# Patient Record
Sex: Male | Born: 1940 | Race: White | Hispanic: No | Marital: Married
Health system: Midwestern US, Academic
[De-identification: ages and names within clinical notes are randomized; demographics above are authoritative.]

---

## 2016-05-24 MED ORDER — IPRATROPIUM-ALBUTEROL 0.5 MG-3 MG(2.5 MG BASE)/3 ML IN NEBU
3 mL | INJECTION | RESPIRATORY_TRACT | 5 refills | 30.00000 days | Status: DC | PRN
Start: 2016-05-24 — End: 2016-11-10

## 2016-05-25 MED ORDER — PREDNISONE 10 MG PO TAB
20 mg | ORAL_TABLET | Freq: Every day | ORAL | 0 refills | Status: AC
Start: 2016-05-25 — End: ?

## 2016-05-25 MED ORDER — BENZONATATE 100 MG PO CAP
100 mg | ORAL_CAPSULE | Freq: Three times a day (TID) | ORAL | 0 refills | 9.00000 days | Status: DC | PRN
Start: 2016-05-25 — End: 2016-09-01

## 2016-05-26 MED ORDER — AZITHROMYCIN 250 MG PO TAB
ORAL_TABLET | Freq: Every day | 0 refills | Status: DC
Start: 2016-05-26 — End: 2016-06-28

## 2016-06-27 MED ORDER — MONTELUKAST 10 MG PO TAB
ORAL_TABLET | Freq: Every evening | 11 refills
Start: 2016-06-27 — End: ?

## 2016-06-28 MED ORDER — TIZANIDINE 4 MG PO TAB
4 mg | ORAL_TABLET | ORAL | 0 refills | Status: DC | PRN
Start: 2016-06-28 — End: 2016-06-28

## 2016-06-28 MED ORDER — ACETAMINOPHEN 500 MG PO TAB
1000 mg | ORAL_TABLET | Freq: Three times a day (TID) | ORAL | 1 refills | Status: DC
Start: 2016-06-28 — End: 2016-06-28

## 2016-06-28 MED ORDER — ACETAMINOPHEN 500 MG PO TAB
1000 mg | ORAL_TABLET | Freq: Three times a day (TID) | ORAL | 1 refills | Status: DC
Start: 2016-06-28 — End: 2016-11-16

## 2016-06-28 MED ORDER — TIZANIDINE 4 MG PO TAB
4 mg | ORAL_TABLET | ORAL | 0 refills | Status: DC | PRN
Start: 2016-06-28 — End: 2016-11-10

## 2016-06-28 MED ORDER — OXYCODONE 5 MG PO TAB
5 mg | ORAL_TABLET | Freq: Two times a day (BID) | ORAL | 0 refills | 6.00000 days | Status: DC | PRN
Start: 2016-06-28 — End: 2016-11-10

## 2016-06-28 MED ORDER — BUMETANIDE 2 MG PO TAB
2 mg | ORAL_TABLET | Freq: Two times a day (BID) | ORAL | 3 refills | Status: DC
Start: 2016-06-28 — End: 2016-06-28

## 2016-06-28 MED ORDER — BUMETANIDE 2 MG PO TAB
2 mg | ORAL_TABLET | Freq: Two times a day (BID) | ORAL | 3 refills | Status: DC
Start: 2016-06-28 — End: 2016-06-29

## 2016-06-29 MED ORDER — BUMETANIDE 2 MG PO TAB
2 mg | ORAL_TABLET | Freq: Every day | ORAL | 3 refills | Status: DC
Start: 2016-06-29 — End: 2016-08-08

## 2016-06-29 MED ORDER — GABAPENTIN 100 MG PO CAP
300 mg | ORAL_CAPSULE | Freq: Three times a day (TID) | ORAL | 3 refills | Status: DC
Start: 2016-06-29 — End: 2016-07-03

## 2016-07-03 MED ORDER — GABAPENTIN 100 MG PO CAP
400 mg | ORAL_CAPSULE | Freq: Three times a day (TID) | ORAL | 3 refills | Status: DC
Start: 2016-07-03 — End: 2016-08-17

## 2016-07-20 MED ORDER — PANTOPRAZOLE 40 MG PO TBEC
ORAL_TABLET | Freq: Two times a day (BID) | 11 refills
Start: 2016-07-20 — End: ?

## 2016-07-20 MED ORDER — RANITIDINE HCL 150 MG PO TAB
ORAL_TABLET | Freq: Every day | 2 refills
Start: 2016-07-20 — End: ?

## 2016-07-24 ENCOUNTER — Encounter: Admit: 2016-07-24 | Discharge: 2016-07-24 | Payer: MEDICARE

## 2016-07-27 ENCOUNTER — Ambulatory Visit: Admit: 2016-07-27 | Discharge: 2016-07-27 | Payer: MEDICARE

## 2016-07-27 ENCOUNTER — Encounter: Admit: 2016-07-27 | Discharge: 2016-07-27 | Payer: MEDICARE

## 2016-07-27 DIAGNOSIS — J479 Bronchiectasis, uncomplicated: ICD-10-CM

## 2016-07-27 DIAGNOSIS — I2510 Atherosclerotic heart disease of native coronary artery without angina pectoris: ICD-10-CM

## 2016-07-27 DIAGNOSIS — J45909 Unspecified asthma, uncomplicated: ICD-10-CM

## 2016-07-27 DIAGNOSIS — J449 Chronic obstructive pulmonary disease, unspecified: ICD-10-CM

## 2016-07-27 DIAGNOSIS — J302 Other seasonal allergic rhinitis: ICD-10-CM

## 2016-07-27 DIAGNOSIS — R0600 Dyspnea, unspecified: ICD-10-CM

## 2016-07-27 DIAGNOSIS — G4733 Obstructive sleep apnea (adult) (pediatric): ICD-10-CM

## 2016-07-27 DIAGNOSIS — M954 Acquired deformity of chest and rib: ICD-10-CM

## 2016-07-27 DIAGNOSIS — L03116 Cellulitis of left lower limb: ICD-10-CM

## 2016-07-27 DIAGNOSIS — I714 Abdominal aortic aneurysm, without rupture: ICD-10-CM

## 2016-07-27 DIAGNOSIS — Z9981 Dependence on supplemental oxygen: ICD-10-CM

## 2016-07-27 DIAGNOSIS — I509 Heart failure, unspecified: ICD-10-CM

## 2016-07-27 DIAGNOSIS — G4730 Sleep apnea, unspecified: ICD-10-CM

## 2016-07-27 DIAGNOSIS — R609 Edema, unspecified: ICD-10-CM

## 2016-07-27 DIAGNOSIS — I429 Cardiomyopathy, unspecified: ICD-10-CM

## 2016-07-27 DIAGNOSIS — I10 Essential (primary) hypertension: Secondary | ICD-10-CM

## 2016-07-27 DIAGNOSIS — E669 Obesity, unspecified: ICD-10-CM

## 2016-07-27 DIAGNOSIS — Z8614 Personal history of Methicillin resistant Staphylococcus aureus infection: ICD-10-CM

## 2016-07-27 DIAGNOSIS — E785 Hyperlipidemia, unspecified: ICD-10-CM

## 2016-07-27 DIAGNOSIS — Z8679 Personal history of other diseases of the circulatory system: ICD-10-CM

## 2016-07-27 DIAGNOSIS — E782 Mixed hyperlipidemia: ICD-10-CM

## 2016-07-27 DIAGNOSIS — K219 Gastro-esophageal reflux disease without esophagitis: ICD-10-CM

## 2016-07-27 DIAGNOSIS — R05 Cough: ICD-10-CM

## 2016-07-27 DIAGNOSIS — N183 Chronic kidney disease, stage 3 (moderate): ICD-10-CM

## 2016-07-27 DIAGNOSIS — J189 Pneumonia, unspecified organism: ICD-10-CM

## 2016-07-27 DIAGNOSIS — Z95828 Presence of other vascular implants and grafts: ICD-10-CM

## 2016-07-27 DIAGNOSIS — I5042 Chronic combined systolic (congestive) and diastolic (congestive) heart failure: ICD-10-CM

## 2016-07-27 NOTE — Progress Notes
Date of Service: 07/27/2016    Ruben Wood. is a 76 y.o. male.       HPI       Mr. Ruben Wood comes today for routine follow-up visit.  He is a remarkably pleasant gentleman whom I see for his abdominal aortic aneurysm.  He has moderately reduced LV systolic function and heart failure which is a combination of systolic and diastolic heart failure for which he follows with Dr. [name] and our heart failure nurse practitioners.      He reports that he has been struggling with shortness of breath for the last few months after he came down with a respiratory illness.  He has seen a heart failure colleagues in the interim and is scheduled to see Dr. [name] very soon.     He is also dealing with a left leg swelling which is somewhat painful last 1 month.  A duplex ultrasound was ordered by his physicians that was thought to be negative without DVT.  I reviewed the study today including the images and agree with the findings.  The patient had a similar problem last year but it spontaneously resolved.  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  He was placed on antibiotic by another physician though he does not remember the name.  He does not know any details about this.    He had a noncontrast CT done today which I have reviewed.  The aneurysm sac continues to see shrink and now measures 5.4 cm maximum dimension as compared to more than 5.8 last time.There is indeed good news for him    Vitals:    07/27/16 1006   BP: 142/60   Pulse: 95   SpO2: (!) 91%   Weight: (!) 137 kg (302 lb)   Height: 1.803 m (5' 11)     Body mass index is 42.12 kg/m???.     Past Medical History  Patient Active Problem List    Diagnosis Date Noted   ??? Lower extremity edema 07/13/2016   ??? 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 He has deficient pneumococcal serotypes. He has normal tetanus toxoid Ab and CH50. He received Pneumovax in clinic today.    - We recommend obtaining repeat pneumococcal serotypes in 4-6 weeks. We will obtain Diptheria Ab at that time and T&B cell panel.   - We will repeat IgG every 6 months.       ??? Moderate persistent asthma with acute exacerbation 02/25/2016     He has had asthma since sometime in his 20-30s. He gets cough, chest tightness, wheezing, shortness of breath that is year round with worsening in the Spring and Fall. Triggers: URI, hot/cold air. He has a history of colds dropping to the chest. He is currently on Symbicort 160/4.10mcg 2 puff BID but he has lost his spacer. He has had ~5 exacerbations in 2017 and 1 steroid course this year already. Additionally he has been diagnosed with bronchiectasis and emphysema; his PFTs suggest both obstructive and restrictive disease and asthma is unlikely to be the sole cause of his dyspnea.    He has negative IgE immunocaps to aeroallergens which makes the possibility of allergic asthma highly unlikely. His IgE level was 17 and he had negative ANCAs, MPO and PR3.     He has taken 2 days of steroids (prednisone 40mg ), however expired medication.  He reports this has improved his shortness of breath.     - We recommend completing 5 day course of prednisone and will prescribe 3 additional days of prednisone 40mg .   - We recommend adhering to Symbicort 160/4.50mcg 2 puff BID with Spacer. He is currently non-compliant with these medications.   - We recommend re-scheduling appointment with Dr. [name] for continued management.   - We will obtain records from Dr. Jasmine Awe office (Pulmonologist in [place], [place]) on whether the patient truly received Xolair.      ??? 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 He is not having issues with urticaria.    - We recommend IgE immunocaps to aeroallergens.      ??? Chronic Rhinoconjunctivitis 02/25/2016     He has had nasal congestion, rhinorrhea, PND, sneezing, itchy/watery eyes since he was a child that is year round and not worsening in seasons. Nasal sprays are not helpful (Flonase, Nasacort) and patient had incorrect technique with sprays with resultant nosebleeds. He takes Zyrtec 10mg  daily and Singulair 10mg  daily. He has had prior IT with Dr. [name] in [place], in [place], and with ENT physician Dr. Ander [name] in [place], [place]. He only found first round of IT helpful. He has never had sinus imaging or endoscopy. He had a normal CT sinuses 02/2016 and negative IgE immunocaps for aeroallergens 02/2016.     His symptoms are currently uncontrolled. He found many corticosteroids unhelpful including Flonase, Rhinocort, Nasacort. He is currently off of Zyrtec.     - We recommend starting Qnasl 2 sprays in each nostril once daily. We reviewed technique in clinic.   - We recommend re-starting zyrtec 10mg  daily and continuing singulair 10mg  daily. His most recent eGFR is 69. If this changes in the future, his anti-histamine dose may need to be adjusted.  - We recommend stopping Atrovent given the possibility of early glaucoma.      ??? 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 Etiologies include likely atopic disease, but other considerations include vasculitidies such as EGPA. Less likely parasitic infection as no bothersome GI symptoms or serpiginous rashes.    - We recommend repeating CBC w/ diff.   - We will obtain ANCAs     ??? Recurrent infections 02/25/2016     He has had 5-10 rounds of antibiotics per year for sinus infections and bronchitis. He was previously having recurrent pneumonias (3-4 times a year for 5 years) however not as of recently. Additionally he has had cellulitis in [place] requiring hospitalization and IV Abx. Although these infections could be explained by underlying disease processes (obstructive/restrictive lung diseases and venous stasis requiring vein stripping), he meets the warning signs from [facility] 10 signs of PIDD. He had mildly low IgG at 636 with normal IgA and IgM. He had positive tetanus toxoid Ab and normal CH50. He had deficient pneumococcal serotypes.     He received Pneumovax in clinic today. He received Prevnar in 2016.     - We recommend obtaining repeat pneumococcal serotypes in 4-6 weeks. We will obtain Diptheria Ab at that time and T&B cell panel.   - We will repeat IgG every 6 months.       ??? Postoperative visit 02/23/2016   ??? S/P left pulmonary artery pressure sensor implant placement (CardioMEMs)  02/02/2016     * Patient has CardioMEMs (Pulmonary Artery Pressure Sensor)* Please call Matthew Folks, CardioMEMs Program Coordinator [phone] or page Heart Failure  Rounding Team if patient is admitted or presents to ED*      ??? Ischemic cardiomyopathy 01/12/2016   ??? 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 Wound of skin 01/12/2016   ??? 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 Varicose veins of left lower extremity with pain 01/05/2016   ??? 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 Obesity, morbid (HCC) 11/25/2015   ??? Varicose veins of leg with pain, left 11/25/2015   ??? Pain of left lower extremity 11/25/2015   ??? Hypercholesterolemia 11/25/2015   ??? Obesity (BMI 35.0-39.9 without comorbidity) 11/25/2015   ??? 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 Bronchiectasis without complication (HCC) 06/18/2015     -flutter  -non sputum producer     ??? Chronic obstructive pulmonary disease (HCC) 03/10/2015   ??? Abnormal cortisol level 12/25/2014   ??? Venous stasis dermatitis of both lower extremities 11/04/2014   ??? Sacroiliac dysfunction 07/28/2014 ??? 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 Chronic combined systolic and diastolic congestive heart failure, NYHA class 2 (HCC) 07/19/2014     11/04/14: EF 20%, severely dilated LV with grade 1 diastolic dysfunction.  11/12/2014 In clinic today, patient appears fairly well compensated.  He does have some LEE, but no significant rales (just some at bases), no JVD sitting upright, and no sx to suggest decompensation.  We will continue metoprolol, spironolactone, and bumex at current doses.    11/17/2014 Edema much improved in lower ext, Cr trending down on last labs.  Will recheck labs today.  Advised to weigh himself daily.  Rechecking BMP today to ensure not overdoing diuretics.     On metoprolol, spironolactone.  Will need to explore allergy to ARB more as patient may benefit from ACEI.      03/29/14 CardioMEMS implant by Dr. Chales Abrahams  1. Normal cardiac output and cardiac index.  2. Normal pulmonary pressures and normal pulmonary capillary wedge pressure.  3. Successful insertion of CardioMEMS pulmonary artery pressure sensor     ??? Cardiomyopathy (HCC) 07/19/2014       CardioMEMS Primary Screening                                  Date: 02/23/2015        Heart failure admission within last 12 months                  Date: 11/03/14 Yes        NYHA HF Class III Yes        Able to take dual antiplatelet or anticoagulants for 1 month post implant Yes   Additional Screening if all above are yes        Patient with active infection No        History of recurrent PE or DVT No        Unable to tolerate RHC No        GFR < 25 mL/min and non-responsive to diuretics or on chronic renal dialysis No  Congenital heart disease or mechanical right heart valve No        CRT device implanted within last 3 months?          Date of Implant:  No        BMI > 35, axillary chest circumference > 165 cm                       140 cm  No        Unwilling to transmit device date or concerns with patient compliance No        ??? 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      ??? CKD (chronic kidney disease) stage 3, GFR 30-59 ml/min 08/26/2013   ??? Mixed dyslipidemia 08/26/2013     Hepatic Function    Lab Results   Component Value Date/Time    ALBUMIN 4.0 11/04/2014 12:26 AM    TOTAL PROTEIN 6.9 11/04/2014 12:26 AM    ALK PHOSPHATASE 61 11/04/2014 12:26 AM    Lab Results   Component Value Date/Time    AST (SGOT) 41* 11/04/2014 12:26 AM    ALT (SGPT) 19 11/04/2014 12:26 AM    TOTAL BILIRUBIN 1.4* 11/04/2014 12:26 AM        Lab Results   Component Value Date    CHOL 148 12/15/2013    TRIG 122 12/15/2013    HDL 51 12/15/2013    LDL 88 12/15/2013    VLDL 24 12/15/2013    NONHDLCHOL 97 12/15/2013       BP Readings from Last 3 Encounters:   11/12/14 129/80   11/09/14 111/44   11/04/14 146/65     Plan:   76 y.o. with known CAD with CTO of RCA and obtuse marginal branch with high grade stenosis (90%) in mid left circ.    Advised heart healthy diet and daily exercise.    Continue high intensity statin, atorvastatin       ??? AAA (abdominal aortic aneurysm) (HCC) 10/15/2012     Duplex done at OSH in 2007 and 2008 ~ 4.1 cm    Duplex 10/15/12-AAA 7.3 cm AP x 7.3 cm       ??? History of MRSA infection 10/14/2012   ??? Chronic cough 08/29/2012   ??? CAD (coronary artery disease) 08/15/2012     07/11/2002- Cath @ [facility] showed Severe double vessel disease(OMB of circumflex and distal circ)  inferior-basilar dyskinesis.                  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    09/12/12   Cath - [facility]:  CTO of the right coronary artery, CTO of the obtuse marginal branch and high-grade stenosis of   90% in the mid left circumflex artery No significant stenosis in the left anterior descending artery or left main vessel     Failed attempt in opening 2nd obtuse marginal CTO as described  above.    10/14/12: Unsuccessful attempt to open CTO of OMB and mid-CFX by Dr. Mackey Birchwood         ??? OSA on CPAP 08/13/2012     CPAP at  DME: [facility]     ??? GERD (gastroesophageal reflux disease) 08/13/2012     -PPI  -follows with GI     ??? 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  Discussed the importance of diet.  Exercise as tolerated.        ??? Essential hypertension 08/13/2012   ??? Chest wall deformity 07/09/2012     Chest wall reconstruction on 07/09/12  Lung herniation- bio bridge           Review of Systems   Constitution: Positive for weight gain.   HENT: Positive for hearing loss, hoarse voice and stridor.    Eyes: Positive for photophobia.   Cardiovascular: Positive for claudication, dyspnea on exertion and leg swelling.   Respiratory: Positive for cough, shortness of breath, sputum production and wheezing.    Endocrine: Negative.    Hematologic/Lymphatic: Bruises/bleeds easily.   Skin: Positive for dry skin and itching.   Musculoskeletal: Positive for back pain, gout and muscle cramps.   Gastrointestinal: Positive for bloating.   Genitourinary: Negative.    Neurological: Positive for loss of balance and paresthesias.   Psychiatric/Behavioral: Negative. Allergic/Immunologic: Positive for environmental allergies.   All other systems reviewed and are negative.      Physical Exam      General Appearance: no acute distress, obese  Skin: warm & intact ???  HEENT: EOMI, mucous membranes moist, oropharynx is clear ???  Neck Veins: neck veins are flat & not distended ???  Carotid Arteries: no bruits ???  Chest Inspection: chest incisions are well healed ???  Auscultation/Percussion: lungs clear to auscultation, no rales, rhonchi, or wheezing ???  Cardiac Rhythm: regular rhythm & normal rate ???  Cardiac Auscultation: distant heart tones, but otherwise normal S1 & S2, no S3 or S4, no rub ???  Murmurs: no cardiac murmurs ???  Extremities: 1-2+ right leg edema. 3-4+ left leg edema. Left shin is erythematous and left leg is moderately tender  Abdominal Exam: obese, non-tender, no masses, bowel sounds+, nonpalpable abdominal aorta ???  Liver & Spleen: difficult to assess due to obese body habitus ???  Neurologic Exam: oriented to time, place and person; no focal neurologic deficit    Problems Addressed Today  Encounter Diagnoses   Name Primary?   ??? Abdominal aortic aneurysm (AAA) without rupture (HCC) Yes       Assessment and Plan     This in summary from my standpoint in regards to his infrarenal abdominal aortic aneurysm that isstatus post endovascular repair he has a shrinking aortic aneurysm sac and overall is doing well from that regard.  I will see him back in 1 year with a repeat noncontrast CT of the abdomen and pelvis.    I am worried about his left leg swelling which is at least 3+ edema going from the foot all the way up to the thighs.  The shin is red and moderately tender.  I have asked him to stop by Dr. Lowella Bandy office to see if he  can be seen today for ruling out cellulitis.  I reviewed his echocardiogram also and he has an eccentric at least moderate mitral regurgitation which may actually be worse than it looks. I will also sent a communication to Dr. Sanjuana Letters office to see what they thought about it.           Current Medications (including today's revisions)  ??? acetaminophen (TYLENOL) 500 mg tablet Take 2 tablets by mouth three times daily. Max of 4,000 mg of acetaminophen in 24 hours.   ??? albuterol (PROAIR HFA, VENTOLIN HFA, OR PROVENTIL HFA) 90 mcg/actuation inhaler Inhale 2 puffs by mouth into the lungs every 4 hours as needed for Wheezing or Shortness of Breath.   ??? albuterol-ipratropium (DUONEB) 0.5 mg-3 mg(2.5 mg base)/3 mL nebulizer solution Inhale 3 mL solution by nebulizer as directed every 6 hours as needed for Wheezing.   ??? allopurinol (ZYLOPRIM) 300 mg tablet Take 1 tablet by mouth daily.   ??? AMITRIPTYLINE HCL (AMITRIPTYLINE/GABAPENTIN/EMU OIL(#)) 05/24/08 % Apply 5 g topically to affected area three times daily.   ??? aspirin EC 81 mg tablet Take 81 mg by mouth daily.   ??? atorvastatin (LIPITOR) 20 mg tablet TAKE ONE TABLET BY MOUTH ONCE DAILY   ??? benzonatate (TESSALON PERLES) 100 mg capsule Take 1 capsule by mouth three times daily as needed for Cough.   ??? budesonide/formoterol (SYMBICORT HFA) 160/4.5 mcg inhalation Inhale 2 puffs by mouth into the lungs twice daily. Rinse mouth after use.   ??? bumetanide (BUMEX) 2 mg tablet Take 1 tablet by mouth daily.   ??? cetirizine (ZYRTEC) 10 mg tablet Take 10 mg by mouth every morning.   ??? cholecalciferol (VITAMIN D-3) 1,000 units tablet Take 1,000 Units by mouth daily.   ??? citalopram (CELEXA) 20 mg tablet Take 1 tablet by mouth daily.   ??? codeine-guaiFENesin (ROBITUSSIN-AC) 10-100 mg/5 mL oral solution Take 5 mL by mouth every 4 hours as needed for Cough.   ??? Flunisolide 25 mcg (0.025 %) spry Apply 2 sprays to each nostril as directed twice daily.   ??? gabapentin (NEURONTIN) 100 mg capsule Take 4 capsules by mouth three times daily.   ??? GUAIFENESIN (MUCINEX PO) Take 1 tablet by mouth as Needed.   ??? ipratropium bromide (ATROVENT) 42 mcg (0.06 %) nasal spray 1 spray to each nostril Q6H PRN   ??? L.ACID/L.CASEI/B.BIF/B.LON/FOS (PROBIOTIC BLEND PO) Take 1 Cap by mouth.   ??? lutein-zeaxanthin 25-5 mg cap Take  by mouth.   ??? MEDICAL SUPPLY, MISCELLANEOUS (COMPRESSION STOCKINGS MISC) Use  as directed. 20-30 mm hg pressure   ??? metoprolol XL (TOPROL XL) 25 mg extended release tablet Take 1 tablet by mouth daily.   ??? montelukast (SINGULAIR) 10 mg tablet TAKE ONE TABLET BY MOUTH AT BEDTIME. MAINTENANCE THERAPY FOR ASTHMA.   ??? mupirocin (BACTROBAN) 2 % topical ointment    ??? nitroglycerin (NITROSTAT) 0.4 mg tablet Place 1 Tab under tongue every 5 minutes as needed for Chest Pain.   ??? oxyCODONE (ROXICODONE, OXY-IR) 5 mg tablet Take 1 tablet by mouth every 12 hours as needed for Pain (Patient taking differently: Take 5 mg by mouth three times daily )   ??? pantoprazole DR (PROTONIX) 40 mg tablet Take 1 Tab by mouth twice daily.   ??? ranitidine(+) (ZANTAC) 150 mg tablet TAKE ONE TABLET BY MOUTH ONCE DAILY   ??? senna/docusate (SENOKOT-S) 8.6/50 mg tablet Take 1 Tab by mouth twice daily.   ??? sildenafil(+) (VIAGRA) 50 mg tablet Take 1 tablet  by mouth as Needed for Erectile dysfunction.   ??? spironolactone (ALDACTONE) 25 mg tablet Take 1 tablet by mouth twice daily.   ??? tiZANidine (ZANAFLEX) 4 mg tablet Take 1 tablet by mouth every 6 hours as needed.

## 2016-07-28 ENCOUNTER — Encounter: Admit: 2016-07-28 | Discharge: 2016-07-28 | Payer: MEDICARE

## 2016-07-28 NOTE — Telephone Encounter
Received VM from pt's wife, Ruben Wood, reporting that they both had appointments on May 24, they talked with Dr. Samuella Cota and told her they may not be able to make the appointment due to Ruben Wood having an appointment with the cardiology team right before that.  She reports she told Dr. Samuella Cota she would change the dates, but she told her not to, if they could get there by 11:00 she would still see them.  She reports the appointment did run into their appointment times, they called and everything was fine, but now they are getting letters that if they have enough no shows in a 12 month period they will be dismissed from the clinic and I call them threatening letters.  She states, I don't appreciate that and neither does Ruben and I want the doctor to know that we are very unhappy with getting these letters when all of this should not have ever been in the system.      Per Dr. Hendricks Milo telephone note on 07/10/16, Him and his wife Ruben Wood have an appointment with me this Thursday on 5/24.  Mrs. Ruben Wood was telling me that due to recent problems with Ruben Wood is heart failure, they are planning to see a cardiologist on 5/24 at 9:45 AM.  They are worried about not being able to make the appointment with me.  I told them both to come to the clinic, if possible, by 11 AM that day.  We will make our best effort to see Mr. Ruben Wood and Ruben Wood.    Returned the call at [phone].  Spoke with Ruben Wood.  I apologized for the letters and let him know I can see that they spoke with Dr. Samuella Cota regarding possibly missing the appointments on that day.  I let him know that Dr. Samuella Cota may not be aware that this letter was sent out and I will let her know that they were unhappy.  I let him know I will see if the the no-show could be removed from their charts.  He reports they would like to re-schedule their appointments; offered to provide the phone number for scheduling and he reports they have the number.  He had no further questions or concerns.

## 2016-08-01 ENCOUNTER — Encounter: Admit: 2016-08-01 | Discharge: 2016-08-01 | Payer: MEDICARE

## 2016-08-01 NOTE — Telephone Encounter
E-mail sent to IM Scheduling and [facility] to request No Show be removed from the patient chart.

## 2016-08-02 NOTE — Telephone Encounter
Per e-mail from [facility]:  I have updated this appt from a no-show to a cancel. The letter has been voided and he will not be penalized for this visit. Thank you for letting us know!

## 2016-08-08 ENCOUNTER — Encounter: Admit: 2016-08-08 | Discharge: 2016-08-08 | Payer: MEDICARE

## 2016-08-08 ENCOUNTER — Ambulatory Visit: Admit: 2016-08-08 | Discharge: 2016-08-08 | Payer: MEDICARE

## 2016-08-08 DIAGNOSIS — I5042 Chronic combined systolic (congestive) and diastolic (congestive) heart failure: Principal | ICD-10-CM

## 2016-08-08 DIAGNOSIS — G4733 Obstructive sleep apnea (adult) (pediatric): ICD-10-CM

## 2016-08-08 DIAGNOSIS — I420 Dilated cardiomyopathy: ICD-10-CM

## 2016-08-08 DIAGNOSIS — I10 Essential (primary) hypertension: ICD-10-CM

## 2016-08-08 DIAGNOSIS — Z789 Other specified health status: ICD-10-CM

## 2016-08-08 DIAGNOSIS — Z959 Presence of cardiac and vascular implant and graft, unspecified: ICD-10-CM

## 2016-08-08 LAB — BNP (B-TYPE NATRIURETIC PEPTI): Lab: 81 pg/mL (ref 0–100)

## 2016-08-08 LAB — BASIC METABOLIC PANEL
Lab: 1.3 mg/dL — ABNORMAL HIGH (ref 0.4–1.24)
Lab: 101 mg/dL — ABNORMAL HIGH (ref 70–100)
Lab: 104 MMOL/L (ref 98–110)
Lab: 137 MMOL/L (ref 137–147)
Lab: 33 mg/dL — ABNORMAL HIGH (ref 7–25)
Lab: 4.8 MMOL/L (ref 3.5–5.1)
Lab: 6 (ref 3–12)

## 2016-08-08 MED ORDER — POTASSIUM CHLORIDE 20 MEQ PO TBTQ
20 meq | ORAL_TABLET | Freq: Two times a day (BID) | ORAL | 3 refills | 30.00000 days | Status: AC
Start: 2016-08-08 — End: 2016-08-17

## 2016-08-08 MED ORDER — BUMETANIDE 2 MG PO TAB
2 mg | ORAL_TABLET | Freq: Every day | ORAL | 3 refills | Status: AC
Start: 2016-08-08 — End: 2016-08-17

## 2016-08-08 NOTE — Progress Notes
Daily CardioMEMS Readings    Goal PA Diastolic: 7-16 mmHg    Taken on PA Systolic PA Diastolic PA Mean Heart Rate Notes   08-08-2016, 06:22 AM 28 mmHg 11 mmHg 17 mmHg 93 bpm    08-07-2016, 06:31 AM 33 mmHg 15 mmHg 21 mmHg 81 bpm    08-06-2016, 07:45 AM 30 mmHg 11 mmHg 17 mmHg 89 bpm    08-05-2016, 04:15 PM 28 mmHg 10 mmHg 16 mmHg 94 bpm    08-04-2016, 12:36 PM 27 mmHg 10 mmHg 16 mmHg 96 bpm    08-04-2016, 09:03 AM 30 mmHg 10 mmHg 17 mmHg 79 bpm    08-04-2016, 08:30 AM 32 mmHg 13 mmHg 19 mmHg 84 bpm    08-03-2016, 09:37 AM 26 mmHg 8 mmHg 15 mmHg 96 bpm    08-01-2016, 08:01 AM 32 mmHg 13 mmHg 19 mmHg 101 bpm    07-30-2016, 08:09 AM 29 mmHg 12 mmHg 18 mmHg 94 bpm    07-28-2016, 08:30 AM 28 mmHg 9 mmHg 15 mmHg 91 bpm    07-27-2016, 04:51 AM 30 mmHg 12 mmHg 18 mmHg 99 bpm    07-26-2016, 06:32 PM 31 mmHg 11 mmHg 18 mmHg 99 bpm    07-25-2016, 10:17 AM 28 mmHg 11 mmHg 17 mmHg 100 bpm    07-24-2016, 05:14 PM 28 mmHg 11 mmHg 16 mmHg 96 bpm    07-21-2016, 09:28 AM 30 mmHg 12 mmHg 18 mmHg 88 bpm    07-20-2016, 08:12 AM 30 mmHg 12 mmHg 18 mmHg 87 bpm    07-19-2016, 09:16 AM 30 mmHg 11 mmHg 18 mmHg 93 bpm    07-18-2016, 10:54 AM 30 mmHg 13 mmHg 19 mmHg 102 bpm    07-16-2016, 08:10 AM 33 mmHg 13 mmHg 20 mmHg 96 bpm    07-14-2016, 02:44 PM 29 mmHg 10 mmHg 16 mmHg 94 bpm    07-13-2016, 05:31 AM 32 mmHg 13 mmHg 19 mmHg 96 bpm PAD 13--(Goal 9-16) OV with JP today. No medication changes. LS   07-12-2016, 11:21 AM 29 mmHg 9 mmHg 16 mmHg 82 bpm    07-11-2016, 09:33 AM 30 mmHg 11 mmHg 17 mmHg 97 bpm    07-10-2016, 10:07 AM 31 mmHg 12 mmHg 18 mmHg 101 bpm    07-09-2016, 07:20 PM 39 mmHg 18 mmHg 25 mmHg 87 bpm    07-07-2016, 07:05 AM 33 mmHg 13 mmHg 20 mmHg 80 bpm    07-06-2016, 06:18 AM 30 mmHg 10 mmHg 17 mmHg 79 bpm    07-05-2016, 09:44 AM 31 mmHg 12 mmHg 18 mmHg 85 bpm    07-04-2016, 10:33 AM 33 mmHg 12 mmHg 19 mmHg 70 bpm    07-03-2016, 08:16 AM 28 mmHg 13 mmHg 18 mmHg 87 bpm    07-01-2016, 08:40 AM 28 mmHg 9 mmHg 15 mmHg 67 bpm 06-29-2016 -- -- -- -- No readings yet today. After review of patient's BMP and BNP results, Herby Abraham and Dr. Crecencio Mc feel patient's weight gain is calorie weight gain since patient is no longer following his weight loss diet plan. Order from Herby Abraham to DECREASE BUMEX to 2mg  DAILY & repeat BMP on 5/17. Continue  to encourage TED hose for venous insufficiency. LS   06-28-2016, 12:13 PM 30 mmHg 11 mmHg 18 mmHg 96 bpm OV with Herby Abraham today. Patient's bumex was increased to 2mg  AM and 1mg  PM by pt's PCP 1 week ago. Dana increased Bumex to 2mg  BID today. His weight is up 23lbs since last clinic visit, & pt  has peripheral edema--TED hose recommended. Next F/U apt 5/24 with Bunnie Philips. LS   06-26-2016, 04:05 PM 28 mmHg 10 mmHg 16 mmHg 93 bpm Wife reports weight 288.2lbs today, up 8 lbs from one week ago.   New Swelling in LEs this week--was not present last week.   Abdominal swelling worse this week than last week.   Increased SOA with activity.Pt denies SOA at rest. Patient has been using oxygen intermittently during the day over the last few weeks (recent respiratory infection). Patient recently finished a course of levaquin and prednisone on 5/3.   CardioMEMS readings within goal.  Labs requested from [facility] (see lab flowsheet)  Wife is concerned and feels patient needs to be evaluated by HF provider this week--Scheduled patient with Herby Abraham on 5/9 at 15:00. Confirmed date/time with patient's wife.  LS   06-25-2016, 07:50 AM 31 mmHg 12 mmHg 18 mmHg 84 bpm    06-23-2016, 06:40 AM 29 mmHg 10 mmHg 17 mmHg 75 bpm    06-22-2016, 06:13 AM 27 mmHg 9 mmHg 15 mmHg 82 bpm    06-21-2016, 11:46 AM 25 mmHg 8 mmHg 14 mmHg 86 bpm    06-20-2016, 08:44 PM 25 mmHg 7 mmHg 13 mmHg 81 bpm    06-19-2016, 11:08 AM 24 mmHg 8 mmHg 14 mmHg 91 bpm    06-18-2016, 03:19 PM 31 mmHg 11 mmHg 17 mmHg 76 bpm    06-17-2016, 10:21 AM 25 mmHg 7 mmHg 13 mmHg 87 bpm    06-16-2016, 10:22 AM 27 mmHg 9 mmHg 15 mmHg 75 bpm Matthew Folks, RN, Engineer, civil (consulting)  Phone: [phone]  Pager: [phone]

## 2016-08-08 NOTE — Telephone Encounter
Based on lab results from today, Dr. Jacqulyn Ducking would like Ruben Wood to increase his Bumex to 4mg  BID with BID of Kcl.  He needs a BMP check with his HF visit.  Refilled Bumex with normal instructions, and patient will increase per Dr. Sanjuana Letters instructions the next week.

## 2016-08-08 NOTE — Progress Notes
Date of Service: 08/08/2016    Ruben Jennette. is a 76 y.o. male.       HPI     Ruben Wood is a 76 yo male who presents to our clinic for follow up of his chronic systolic and diastolic heart failure. His most recent EF was 40%. Previously he had an EF of 20%. He also has hisotry of CAD, AAA that he follows with Dr. Harley Alto for, HTN, COPD, OSA, and chronic venous stasis. He underwent placement of his Cardiomems device in February of 2017 for more intensive management of his heart failure. He also underwent placement of a [employer]. Jude CRT-D.     Today Ruben Wood is presenting with complaints of dyspnea with light activity, even at rest. He also describes significant orthopnea, requiring him to sleep in recliner at night.  He has had intermittent cough, but no fevers.  He also describes some significant fluid retention particularly in his lower extremities.  Been seen multiple times for this, and underwent ultrasound for DVT, but none was found.  There is previously been concern for cellulitis, and he states he was placed on antibiotic but is not sure which one it was.  He states that he is aware of his fluid restriction, and some restriction in attempts to followed as best he can.  He takes Bumex daily, 2 mg in the morning.  He does not take any potassium replacement with this.  He denies any chest pain, palpitations, device discharges, fevers or chills.    He was previously seen in the weight management clinic, and had significant weight loss at that time.  However since he developed respiratory illness this past winter, he has been steadily gaining weight.  His back and leg pain that is being addressed in the spine center is also limiting his ability to be active.  His weight is increased nearly 40 pounds since her last visit in March of this year.  This is unlikely to be due purely to caloric intake, particularly given his symptomatology.         Vitals:    08/08/16 0916   BP: 122/62   Pulse: 86   SpO2: 96% Weight: (!) 138.8 kg (306 lb)   Height: 1.803 m (5' 11)     Body mass index is 42.68 kg/m???.     Past Medical History  Patient Active Problem List    Diagnosis Date Noted   ??? Lower extremity edema 07/13/2016   ??? 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 He has deficient pneumococcal serotypes. He has normal tetanus toxoid Ab and CH50. He received Pneumovax in clinic today.    - We recommend obtaining repeat pneumococcal serotypes in 4-6 weeks. We will obtain Diptheria Ab at that time and T&B cell panel.   - We will repeat IgG every 6 months.       ??? Moderate persistent asthma with acute exacerbation 02/25/2016     He has had asthma since sometime in his 20-30s. He gets cough, chest tightness, wheezing, shortness of breath that is year round with worsening in the Spring and Fall. Triggers: URI, hot/cold air. He has a history of colds dropping to the chest. He is currently on Symbicort 160/4.41mcg 2 puff BID but he has lost his spacer. He has had ~5 exacerbations in 2017 and 1 steroid course this year already. Additionally he has been diagnosed with bronchiectasis and emphysema; his PFTs suggest  both obstructive and restrictive disease and asthma is unlikely to be the sole cause of his dyspnea.    He has negative IgE immunocaps to aeroallergens which makes the possibility of allergic asthma highly unlikely. His IgE level was 17 and he had negative ANCAs, MPO and PR3.     He has taken 2 days of steroids (prednisone 40mg ), however expired medication. He reports this has improved his shortness of breath.     - We recommend completing 5 day course of prednisone and will prescribe 3 additional days of prednisone 40mg .   - We recommend adhering to Symbicort 160/4.50mcg 2 puff BID with Spacer. He is currently non-compliant with these medications.   - We recommend re-scheduling appointment with Dr. [name] for continued management. - We will obtain records from Dr. Jasmine Awe office (Pulmonologist in [place], [place]) on whether the patient truly received Xolair.      ??? 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 He is not having issues with urticaria.    - We recommend IgE immunocaps to aeroallergens.      ??? Chronic Rhinoconjunctivitis 02/25/2016     He has had nasal congestion, rhinorrhea, PND, sneezing, itchy/watery eyes since he was a child that is year round and not worsening in seasons. Nasal sprays are not helpful (Flonase, Nasacort) and patient had incorrect technique with sprays with resultant nosebleeds. He takes Zyrtec 10mg  daily and Singulair 10mg  daily. He has had prior IT with Dr. [name] in [place], in [place], and with ENT physician Dr. Ander [name] in [place], [place]. He only found first round of IT helpful. He has never had sinus imaging or endoscopy. He had a normal CT sinuses 02/2016 and negative IgE immunocaps for aeroallergens 02/2016.     His symptoms are currently uncontrolled. He found many corticosteroids unhelpful including Flonase, Rhinocort, Nasacort. He is currently off of Zyrtec.     - We recommend starting Qnasl 2 sprays in each nostril once daily. We reviewed technique in clinic.   - We recommend re-starting zyrtec 10mg  daily and continuing singulair 10mg  daily. His most recent eGFR is 69. If this changes in the future, his anti-histamine dose may need to be adjusted.  - We recommend stopping Atrovent given the possibility of early glaucoma.      ??? 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 Etiologies include likely atopic disease, but other considerations include vasculitidies such as EGPA. Less likely parasitic infection as no bothersome GI symptoms or serpiginous rashes.    - We recommend repeating CBC w/ diff.   - We will obtain ANCAs     ??? Recurrent infections 02/25/2016     He has had 5-10 rounds of antibiotics per year for sinus infections and bronchitis. He was previously having recurrent pneumonias (3-4 times a year for 5 years) however not as of recently. Additionally he has had cellulitis in LE requiring hospitalization and IV Abx. Although these infections could be explained by underlying disease processes (obstructive/restrictive lung diseases and venous stasis requiring vein stripping), he meets the warning signs from [facility] 10 signs of PIDD.    He had mildly low IgG at 636 with normal IgA and IgM. He had positive tetanus toxoid Ab and normal CH50. He had deficient pneumococcal serotypes.     He received Pneumovax in clinic today. He received Prevnar in 2016.     - We recommend obtaining repeat pneumococcal serotypes in 4-6 weeks. We will obtain Diptheria  Ab at that time and T&B cell panel.   - We will repeat IgG every 6 months.       ??? Postoperative visit 02/23/2016   ??? S/P left pulmonary artery pressure sensor implant placement (CardioMEMs)  02/02/2016     * Patient has CardioMEMs (Pulmonary Artery Pressure Sensor)* Please call Matthew Folks, CardioMEMs Program Coordinator [phone] or page Heart Failure Rounding Team if patient is admitted or presents to ED*      ??? Ischemic cardiomyopathy 01/12/2016   ??? 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 Wound of skin 01/12/2016   ??? 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 Varicose veins of left lower extremity with pain 01/05/2016   ??? 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 Obesity, morbid (HCC) 11/25/2015   ??? Varicose veins of leg with pain, left 11/25/2015   ??? Pain of left lower extremity 11/25/2015   ??? Hypercholesterolemia 11/25/2015   ??? Obesity (BMI 35.0-39.9 without comorbidity) 11/25/2015   ??? 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 Bronchiectasis without complication (HCC) 06/18/2015     -flutter  -non sputum producer     ??? Chronic obstructive pulmonary disease (HCC) 03/10/2015   ??? Abnormal cortisol level 12/25/2014   ??? Venous stasis dermatitis of both lower extremities 11/04/2014   ??? Sacroiliac dysfunction 07/28/2014   ??? 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 Chronic combined systolic and diastolic congestive heart failure, NYHA class 2 (HCC) 07/19/2014     11/04/14: EF 20%, severely dilated LV with grade 1 diastolic dysfunction.  11/12/2014 In clinic today, patient appears fairly well compensated.  He does have some LEE, but no significant rales (just some at bases), no JVD sitting upright, and no sx to suggest decompensation.  We will continue metoprolol, spironolactone, and bumex at current doses.    11/17/2014 Edema much improved in lower ext, Cr trending down on last labs.  Will recheck labs today.  Advised to weigh himself daily.  Rechecking BMP today to ensure not overdoing diuretics.     On metoprolol, spironolactone.  Will need to explore allergy to ARB more as patient may benefit from ACEI.      03/29/14 CardioMEMS implant by Dr. Chales Abrahams  1. Normal cardiac output and cardiac index.  2. Normal pulmonary pressures and normal pulmonary capillary wedge pressure.  3. Successful insertion of CardioMEMS pulmonary artery pressure sensor     ??? Cardiomyopathy (HCC) 07/19/2014       CardioMEMS Primary Screening                                  Date: 02/23/2015        Heart failure admission within last 12 months                  Date: 11/03/14 Yes        NYHA HF Class III Yes        Able to take dual antiplatelet or anticoagulants for 1 month post implant Yes   Additional Screening if  all above are yes Patient with active infection No        History of recurrent PE or DVT No        Unable to tolerate RHC No        GFR < 25 mL/min and non-responsive to diuretics or on chronic renal dialysis No        Congenital heart disease or mechanical right heart valve No        CRT device implanted within last 3 months?          Date of Implant:  No        BMI > 35, axillary chest circumference > 165 cm                       140 cm  No        Unwilling to transmit device date or concerns with patient compliance No        ??? 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      ??? CKD (chronic kidney disease) stage 3, GFR 30-59 ml/min 08/26/2013   ??? Mixed dyslipidemia 08/26/2013     Hepatic Function    Lab Results   Component Value Date/Time    ALBUMIN 4.0 11/04/2014 12:26 AM    TOTAL PROTEIN 6.9 11/04/2014 12:26 AM    ALK PHOSPHATASE 61 11/04/2014 12:26 AM    Lab Results   Component Value Date/Time    AST (SGOT) 41* 11/04/2014 12:26 AM    ALT (SGPT) 19 11/04/2014 12:26 AM    TOTAL BILIRUBIN 1.4* 11/04/2014 12:26 AM        Lab Results   Component Value Date    CHOL 148 12/15/2013    TRIG 122 12/15/2013    HDL 51 12/15/2013    LDL 88 12/15/2013    VLDL 24 12/15/2013    NONHDLCHOL 97 12/15/2013       BP Readings from Last 3 Encounters:   11/12/14 129/80   11/09/14 111/44   11/04/14 146/65     Plan:   76 y.o. with known CAD with CTO of RCA and obtuse marginal branch with high grade stenosis (90%) in mid left circ.    Advised heart healthy diet and daily exercise.    Continue high intensity statin, atorvastatin ??? AAA (abdominal aortic aneurysm) (HCC) 10/15/2012     Duplex done at OSH in 2007 and 2008 ~ 4.1 cm    Duplex 10/15/12-AAA 7.3 cm AP x 7.3 cm       ??? History of MRSA infection 10/14/2012   ??? Chronic cough 08/29/2012   ??? CAD (coronary artery disease) 08/15/2012     07/11/2002- Cath @ [facility] showed Severe double vessel disease(OMB of circumflex and distal circ)  inferior-basilar dyskinesis.                  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    09/12/12  Cath - [facility]:  CTO of the right coronary artery, CTO of the obtuse marginal branch and high-grade stenosis of   90% in the mid left circumflex artery No significant stenosis in the left anterior descending artery or left main vessel     Failed attempt in opening 2nd obtuse marginal CTO as described above.    10/14/12: Unsuccessful attempt to open CTO of OMB and mid-CFX by Dr. Mackey Birchwood         ??? OSA on CPAP 08/13/2012     CPAP at  DME: [facility]     ??? GERD (gastroesophageal reflux disease) 08/13/2012     -PPI  -follows with GI     ??? 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  Discussed the importance of diet.  Exercise as tolerated.        ??? Essential hypertension 08/13/2012   ??? Chest wall deformity 07/09/2012     Chest wall reconstruction on 07/09/12  Lung herniation- bio bridge           Review of Systems   Constitution: Positive for weight gain.   HENT: Positive for hearing loss, hoarse voice and stridor.    Eyes: Positive for photophobia.   Cardiovascular: Positive for dyspnea on exertion and leg swelling. Respiratory: Positive for cough, shortness of breath and wheezing.    Endocrine: Positive for polyuria.   Hematologic/Lymphatic: Negative.    Skin: Positive for dry skin.   Musculoskeletal: Positive for back pain and gout.   Gastrointestinal: Positive for flatus and nausea.   Genitourinary: Positive for incomplete emptying.   Neurological: Negative.    Psychiatric/Behavioral: Negative.    Allergic/Immunologic: Positive for environmental allergies.       Physical Exam   Constitutional: He appears well-developed and well-nourished. No distress.   HENT:   Head: Normocephalic and atraumatic.   Eyes: Conjunctivae and EOM are normal.   Neck: Normal range of motion.   Evaluation of JVD limited secondary to body habitus.   Cardiovascular:   Soft heart sounds, normal rate and regular rhythm.  No murmur auscultated.   Pulmonary/Chest:   Mildly increased respiratory effort, soft crackles auscultated in lower bases lung fields, though auscultation significantly limited secondary to body habitus.  Soft lung sounds overall.   Abdominal: Soft. Bowel sounds are normal. He exhibits no distension. There is no tenderness.   Musculoskeletal:   2+ pitting edema on the right-hand side to the knee.  3+ pitting edema to mid thigh on the left side with overlying erythema.  No open wounds.  Tender to palpation.   Neurological: He is alert and oriented to person, place, and time.   Skin: He is not diaphoretic.     Assessment and Plan     Ruben Wood presents for follow-up of his chronic systolic and diastolic heart failure.  Today he appears quite volume up, as evidenced by his significant symptomatology, and edematous physical exam.  Evaluation of his Cardiomems device shows overall elevation of his diastolic PA pressures consistent with volume overload.  We will obtain a BMP and BNP, and increase his diuretic dose of Bumex to 4 mg twice a day.  He will need to be seen in the heart failure clinic within  the next week for follow-up of his acute exacerbation of chronic and systolic and diastolic heart failure.  We will consider addition of potassium supplementation with this increased regimen based on his BMP today.    Cardiology Staff Attestation  I have personally seen, examined, and evaluated this patient.  I concur with the above medical documentation including key parts of the history & physical examination as outlined by the resident, Dr. Maxwell Caul, and jointly formulated the treatment plan.    Coral Else. Jacqulyn Ducking, MD, [facility], FACP           Current Medications (including today's revisions)  ??? acetaminophen (TYLENOL) 500 mg tablet Take 2 tablets by mouth three times daily. Max of 4,000 mg of acetaminophen in 24 hours.   ??? albuterol (PROAIR HFA, VENTOLIN HFA, OR PROVENTIL HFA) 90 mcg/actuation inhaler Inhale 2 puffs by mouth into the lungs every 4 hours as needed for Wheezing or Shortness of Breath.   ??? albuterol-ipratropium (DUONEB) 0.5 mg-3 mg(2.5 mg base)/3 mL nebulizer solution Inhale 3 mL solution by nebulizer as directed every 6 hours as needed for Wheezing.   ??? allopurinol (ZYLOPRIM) 300 mg tablet Take 1 tablet by mouth daily.   ??? AMITRIPTYLINE HCL (AMITRIPTYLINE/GABAPENTIN/EMU OIL(#)) 05/24/08 % Apply 5 g topically to affected area three times daily.   ??? aspirin EC 81 mg tablet Take 81 mg by mouth daily.   ??? atorvastatin (LIPITOR) 20 mg tablet TAKE ONE TABLET BY MOUTH ONCE DAILY   ??? benzonatate (TESSALON PERLES) 100 mg capsule Take 1 capsule by mouth three times daily as needed for Cough.   ??? budesonide/formoterol (SYMBICORT HFA) 160/4.5 mcg inhalation Inhale 2 puffs by mouth into the lungs twice daily. Rinse mouth after use.   ??? bumetanide (BUMEX) 2 mg tablet Take 1 tablet by mouth daily.   ??? cetirizine (ZYRTEC) 10 mg tablet Take 10 mg by mouth every morning.   ??? cholecalciferol (VITAMIN D-3) 1,000 units tablet Take 1,000 Units by mouth daily.   ??? citalopram (CELEXA) 20 mg tablet Take 1 tablet by mouth daily. ??? codeine-guaiFENesin (ROBITUSSIN-AC) 10-100 mg/5 mL oral solution Take 5 mL by mouth every 4 hours as needed for Cough.   ??? Flunisolide 25 mcg (0.025 %) spry Apply 2 sprays to each nostril as directed twice daily.   ??? gabapentin (NEURONTIN) 100 mg capsule Take 4 capsules by mouth three times daily.   ??? GUAIFENESIN (MUCINEX PO) Take 1 tablet by mouth as Needed.   ??? ipratropium bromide (ATROVENT) 42 mcg (0.06 %) nasal spray 1 spray to each nostril Q6H PRN   ??? L.ACID/L.CASEI/B.BIF/B.LON/FOS (PROBIOTIC BLEND PO) Take 1 Cap by mouth.   ??? lutein-zeaxanthin 25-5 mg cap Take  by mouth.   ??? MEDICAL SUPPLY, MISCELLANEOUS (COMPRESSION STOCKINGS MISC) Use  as directed. 20-30 mm hg pressure   ??? metoprolol XL (TOPROL XL) 25 mg extended release tablet Take 1 tablet by mouth daily.   ??? montelukast (SINGULAIR) 10 mg tablet TAKE ONE TABLET BY MOUTH AT BEDTIME. MAINTENANCE THERAPY FOR ASTHMA.   ??? mupirocin (BACTROBAN) 2 % topical ointment    ??? nitroglycerin (NITROSTAT) 0.4 mg tablet Place 1 Tab under tongue every 5 minutes as needed for Chest Pain.   ??? oxyCODONE (ROXICODONE, OXY-IR) 5 mg tablet Take 1 tablet by mouth every 12 hours as needed for Pain (Patient taking differently: Take 5 mg by mouth three times daily )   ??? pantoprazole DR (PROTONIX) 40 mg tablet Take 1 Tab by mouth twice daily.   ???  ranitidine(+) (ZANTAC) 150 mg tablet TAKE ONE TABLET BY MOUTH ONCE DAILY   ??? senna/docusate (SENOKOT-S) 8.6/50 mg tablet Take 1 Tab by mouth twice daily.   ??? sildenafil(+) (VIAGRA) 50 mg tablet Take 1 tablet by mouth as Needed for Erectile dysfunction.   ??? spironolactone (ALDACTONE) 25 mg tablet Take 1 tablet by mouth twice daily.   ??? tiZANidine (ZANAFLEX) 4 mg tablet Take 1 tablet by mouth every 6 hours as needed.

## 2016-08-09 ENCOUNTER — Ambulatory Visit: Admit: 2016-08-09 | Discharge: 2016-08-10 | Payer: MEDICARE

## 2016-08-09 ENCOUNTER — Encounter: Admit: 2016-08-09 | Discharge: 2016-08-09 | Payer: MEDICARE

## 2016-08-09 DIAGNOSIS — L03116 Cellulitis of left lower limb: ICD-10-CM

## 2016-08-09 DIAGNOSIS — J189 Pneumonia, unspecified organism: ICD-10-CM

## 2016-08-09 DIAGNOSIS — E785 Hyperlipidemia, unspecified: ICD-10-CM

## 2016-08-09 DIAGNOSIS — I429 Cardiomyopathy, unspecified: ICD-10-CM

## 2016-08-09 DIAGNOSIS — J479 Bronchiectasis, uncomplicated: ICD-10-CM

## 2016-08-09 DIAGNOSIS — E669 Obesity, unspecified: ICD-10-CM

## 2016-08-09 DIAGNOSIS — Z95828 Presence of other vascular implants and grafts: ICD-10-CM

## 2016-08-09 DIAGNOSIS — I5042 Chronic combined systolic (congestive) and diastolic (congestive) heart failure: ICD-10-CM

## 2016-08-09 DIAGNOSIS — I714 Abdominal aortic aneurysm, without rupture: ICD-10-CM

## 2016-08-09 DIAGNOSIS — G4730 Sleep apnea, unspecified: ICD-10-CM

## 2016-08-09 DIAGNOSIS — I2510 Atherosclerotic heart disease of native coronary artery without angina pectoris: ICD-10-CM

## 2016-08-09 DIAGNOSIS — G4733 Obstructive sleep apnea (adult) (pediatric): ICD-10-CM

## 2016-08-09 DIAGNOSIS — Z8614 Personal history of Methicillin resistant Staphylococcus aureus infection: ICD-10-CM

## 2016-08-09 DIAGNOSIS — J449 Chronic obstructive pulmonary disease, unspecified: ICD-10-CM

## 2016-08-09 DIAGNOSIS — Z9981 Dependence on supplemental oxygen: ICD-10-CM

## 2016-08-09 DIAGNOSIS — R0600 Dyspnea, unspecified: ICD-10-CM

## 2016-08-09 DIAGNOSIS — E782 Mixed hyperlipidemia: ICD-10-CM

## 2016-08-09 DIAGNOSIS — N183 Chronic kidney disease, stage 3 (moderate): ICD-10-CM

## 2016-08-09 DIAGNOSIS — I509 Heart failure, unspecified: ICD-10-CM

## 2016-08-09 DIAGNOSIS — I10 Essential (primary) hypertension: Secondary | ICD-10-CM

## 2016-08-09 DIAGNOSIS — J45909 Unspecified asthma, uncomplicated: ICD-10-CM

## 2016-08-09 DIAGNOSIS — J302 Other seasonal allergic rhinitis: ICD-10-CM

## 2016-08-09 DIAGNOSIS — R05 Cough: ICD-10-CM

## 2016-08-09 DIAGNOSIS — K219 Gastro-esophageal reflux disease without esophagitis: ICD-10-CM

## 2016-08-09 DIAGNOSIS — Z8679 Personal history of other diseases of the circulatory system: ICD-10-CM

## 2016-08-09 DIAGNOSIS — M954 Acquired deformity of chest and rib: ICD-10-CM

## 2016-08-09 DIAGNOSIS — R609 Edema, unspecified: ICD-10-CM

## 2016-08-09 MED ORDER — LIDOCAINE 5 % TP PTMD
1 | MEDICATED_PATCH | TOPICAL | 1 refills | Status: SS
Start: 2016-08-09 — End: 2016-11-10

## 2016-08-10 DIAGNOSIS — M545 Low back pain: ICD-10-CM

## 2016-08-10 DIAGNOSIS — M4807 Spinal stenosis, lumbosacral region: Principal | ICD-10-CM

## 2016-08-14 ENCOUNTER — Encounter: Admit: 2016-08-14 | Discharge: 2016-08-14 | Payer: MEDICARE

## 2016-08-14 NOTE — Telephone Encounter
PA needed for Lido Patch. Will submit to [facility].

## 2016-08-16 ENCOUNTER — Encounter: Admit: 2016-08-16 | Discharge: 2016-08-16 | Payer: MEDICARE

## 2016-08-16 DIAGNOSIS — I5042 Chronic combined systolic (congestive) and diastolic (congestive) heart failure: Principal | ICD-10-CM

## 2016-08-17 ENCOUNTER — Encounter: Admit: 2016-08-17 | Discharge: 2016-08-17 | Payer: MEDICARE

## 2016-08-17 ENCOUNTER — Ambulatory Visit: Admit: 2016-08-17 | Discharge: 2016-08-17 | Payer: MEDICARE

## 2016-08-17 DIAGNOSIS — J479 Bronchiectasis, uncomplicated: ICD-10-CM

## 2016-08-17 DIAGNOSIS — I255 Ischemic cardiomyopathy: ICD-10-CM

## 2016-08-17 DIAGNOSIS — G4730 Sleep apnea, unspecified: ICD-10-CM

## 2016-08-17 DIAGNOSIS — K219 Gastro-esophageal reflux disease without esophagitis: ICD-10-CM

## 2016-08-17 DIAGNOSIS — I714 Abdominal aortic aneurysm, without rupture: ICD-10-CM

## 2016-08-17 DIAGNOSIS — I420 Dilated cardiomyopathy: ICD-10-CM

## 2016-08-17 DIAGNOSIS — M954 Acquired deformity of chest and rib: ICD-10-CM

## 2016-08-17 DIAGNOSIS — I509 Heart failure, unspecified: ICD-10-CM

## 2016-08-17 DIAGNOSIS — Z8679 Personal history of other diseases of the circulatory system: ICD-10-CM

## 2016-08-17 DIAGNOSIS — I2510 Atherosclerotic heart disease of native coronary artery without angina pectoris: ICD-10-CM

## 2016-08-17 DIAGNOSIS — E785 Hyperlipidemia, unspecified: ICD-10-CM

## 2016-08-17 DIAGNOSIS — N183 Chronic kidney disease, stage 3 (moderate): ICD-10-CM

## 2016-08-17 DIAGNOSIS — I5042 Chronic combined systolic (congestive) and diastolic (congestive) heart failure: ICD-10-CM

## 2016-08-17 DIAGNOSIS — R609 Edema, unspecified: ICD-10-CM

## 2016-08-17 DIAGNOSIS — E669 Obesity, unspecified: ICD-10-CM

## 2016-08-17 DIAGNOSIS — Z95828 Presence of other vascular implants and grafts: ICD-10-CM

## 2016-08-17 DIAGNOSIS — R0600 Dyspnea, unspecified: ICD-10-CM

## 2016-08-17 DIAGNOSIS — Z8614 Personal history of Methicillin resistant Staphylococcus aureus infection: ICD-10-CM

## 2016-08-17 DIAGNOSIS — E782 Mixed hyperlipidemia: ICD-10-CM

## 2016-08-17 DIAGNOSIS — I429 Cardiomyopathy, unspecified: ICD-10-CM

## 2016-08-17 DIAGNOSIS — Z9981 Dependence on supplemental oxygen: ICD-10-CM

## 2016-08-17 DIAGNOSIS — J189 Pneumonia, unspecified organism: ICD-10-CM

## 2016-08-17 DIAGNOSIS — J449 Chronic obstructive pulmonary disease, unspecified: ICD-10-CM

## 2016-08-17 DIAGNOSIS — I10 Essential (primary) hypertension: Principal | ICD-10-CM

## 2016-08-17 DIAGNOSIS — L03116 Cellulitis of left lower limb: ICD-10-CM

## 2016-08-17 DIAGNOSIS — G4733 Obstructive sleep apnea (adult) (pediatric): ICD-10-CM

## 2016-08-17 DIAGNOSIS — R05 Cough: ICD-10-CM

## 2016-08-17 DIAGNOSIS — J45909 Unspecified asthma, uncomplicated: ICD-10-CM

## 2016-08-17 DIAGNOSIS — J302 Other seasonal allergic rhinitis: ICD-10-CM

## 2016-08-17 LAB — BASIC METABOLIC PANEL
Lab: 10 FL (ref 3–12)
Lab: 134 MMOL/L — ABNORMAL LOW (ref 137–147)
Lab: 137 mg/dL — ABNORMAL HIGH (ref 70–100)
Lab: 2 mg/dL — ABNORMAL HIGH (ref 0.4–1.24)
Lab: 26 MMOL/L (ref 21–30)
Lab: 32 mL/min — ABNORMAL LOW (ref >60)
Lab: 38 mL/min — ABNORMAL LOW (ref >60)
Lab: 5 MMOL/L (ref 3.5–5.1)
Lab: 64 mg/dL — ABNORMAL HIGH (ref 7–25)
Lab: 9.6 mg/dL — ABNORMAL HIGH (ref 8.5–10.6)

## 2016-08-17 LAB — BNP (B-TYPE NATRIURETIC PEPTI): Lab: 81 pg/mL — ABNORMAL HIGH (ref 0–100)

## 2016-08-17 MED ORDER — POTASSIUM CHLORIDE 20 MEQ PO TBTQ
20 meq | ORAL_TABLET | Freq: Every day | ORAL | 3 refills | 30.00000 days | Status: AC
Start: 2016-08-17 — End: 2016-10-10

## 2016-08-17 MED ORDER — BUMETANIDE 2 MG PO TAB
4 mg | ORAL_TABLET | Freq: Two times a day (BID) | ORAL | 2 refills | Status: AC
Start: 2016-08-17 — End: 2016-08-18

## 2016-08-17 NOTE — Progress Notes
Date of Service: 08/17/2016    Ruben Antigua. is a 76 y.o. male.       HPI     Ruben Wood presents to the heart clinic for follow-up.    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 He had also been following with Dr. Vita Barley in the weight reduction clinic.  He also underwent placement of a [employer] CRT-D.     He was seen by Dr. Ferd Glassing on 08/08/2016.  He reported significant orthopnea, lower extremity edema, and weight gain.  Dr. Ferd Glassing noted that Ruben Wood has gained nearly 40 pounds since his last visit in March of this year.  He was instructed to increase Bumex to 4 mg twice daily (from 2 mg in the morning) with BID of KCl.     Today, Ruben Wood reports his body swelling is much better and his exertional dyspnea is a little bit better.  He continuous to sleep in a recliner.  He denies orthopnea, PND, chest pain, palpitation, dizziness, and syncope.??? Clinic weight today has improved to 292.6 lbs (from 306 lbs on 08/08/16).  He home weight is down to 292.6 lbs today.  He is trying to eat a low-salt diet and watching his fluid intake.     His BMP today showed that creatinine has increased to 2.05 and BUN is 64 (from 1.38 & 33 respectively on 08/08/16).  Sodium is 134, K+ 5.0, Cl 98, and CO 26.       His CardioMEMS reading on the website show that his PA diastolic pressure has been ranging from 11-12 mmHg over the past week.         Vitals:    08/17/16 1433   BP: 116/60   Pulse: 89   SpO2: 94%   Weight: 132.7 kg (292 lb 9.6 oz)   Height: 1.803 m (5' 11)     Body mass index is 40.81 kg/m???.     Past Medical History Ruben Wood Active Problem List    Diagnosis Date Noted   ??? Lower extremity edema 07/13/2016   ??? 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 He has deficient pneumococcal serotypes. He has normal tetanus toxoid Ab and CH50. He received Pneumovax in clinic today.    - We recommend obtaining repeat pneumococcal serotypes in 4-6 weeks. We will obtain Diptheria Ab at that time and T&B cell panel.   - We will repeat IgG every 6 months.       ??? Moderate persistent asthma with acute exacerbation 02/25/2016     He has had asthma since sometime in his 20-30s. He gets cough, chest tightness, wheezing, shortness of breath that is year round with worsening in the Spring and Fall. Triggers: URI, hot/cold air. He has a history of colds dropping to the chest. He is currently on Symbicort 160/4.39mcg 2 puff BID but he has lost his spacer. He has had ~5 exacerbations in 2017 and 1 steroid course this year already. Additionally he has been diagnosed with bronchiectasis and emphysema; his PFTs suggest both obstructive and restrictive disease and asthma is unlikely to be the sole cause of his dyspnea.  He has negative IgE immunocaps to aeroallergens which makes the possibility of allergic asthma highly unlikely. His IgE level was 17 and he had negative ANCAs, MPO and PR3.     He has taken 2 days of steroids (prednisone 40mg ), however expired medication. He reports this has improved his shortness of breath.     - We recommend completing 5 day course of prednisone and will prescribe 3 additional days of prednisone 40mg .   - We recommend adhering to Symbicort 160/4.49mcg 2 puff BID with Spacer. He is currently non-compliant with these medications.   - We recommend re-scheduling appointment with Dr. [name] for continued management.   - We will obtain records from Dr. Jasmine Awe office (Pulmonologist in [place], [place]) on whether the Ruben Wood truly received Xolair.      ??? Dermatographic urticaria 02/25/2016 Positive saline reaction on SPT today. He is not having issues with urticaria.    - We recommend IgE immunocaps to aeroallergens.      ??? Chronic Rhinoconjunctivitis 02/25/2016     He has had nasal congestion, rhinorrhea, PND, sneezing, itchy/watery eyes since he was a child that is year round and not worsening in seasons. Nasal sprays are not helpful (Flonase, Nasacort) and Ruben Wood had incorrect technique with sprays with resultant nosebleeds. He takes Zyrtec 10mg  daily and Singulair 10mg  daily. He has had prior IT with Dr. [name] in [place], in [place], and with ENT physician Dr. Ander [name] in [place], [place]. He only found first round of IT helpful. He has never had sinus imaging or endoscopy. He had a normal CT sinuses 02/2016 and negative IgE immunocaps for aeroallergens 02/2016.     His symptoms are currently uncontrolled. He found many corticosteroids unhelpful including Flonase, Rhinocort, Nasacort. He is currently off of Zyrtec.     - We recommend starting Qnasl 2 sprays in each nostril once daily. We reviewed technique in clinic.   - We recommend re-starting zyrtec 10mg  daily and continuing singulair 10mg  daily. His most recent eGFR is 69. If this changes in the future, his anti-histamine dose may need to be adjusted.  - We recommend stopping Atrovent given the possibility of early glaucoma.      ??? 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 Etiologies include likely atopic disease, but other considerations include vasculitidies such as EGPA. Less likely parasitic infection as no bothersome GI symptoms or serpiginous rashes.    - We recommend repeating CBC w/ diff.   - We will obtain ANCAs     ??? Recurrent infections 02/25/2016     He has had 5-10 rounds of antibiotics per year for sinus infections and bronchitis. He was previously having recurrent pneumonias (3-4 times a year for 5 years) however not as of recently. Additionally he has had cellulitis in LE requiring hospitalization and IV Abx. Although these infections could be explained by underlying disease processes (obstructive/restrictive lung diseases and venous stasis requiring vein stripping), he meets the warning signs from [facility] 10 signs of PIDD.    He had mildly low IgG at 636 with normal IgA and IgM. He had positive tetanus toxoid Ab and normal CH50. He had deficient pneumococcal serotypes.     He received Pneumovax in clinic today. He received Prevnar in 2016.     - We recommend obtaining repeat pneumococcal serotypes in 4-6 weeks. We will obtain Diptheria Ab at that time and T&B cell panel.   - We will repeat IgG every 6 months.       ???  Postoperative visit 02/23/2016   ??? S/P left pulmonary artery pressure sensor implant placement (CardioMEMs)  02/02/2016     * Ruben Wood has CardioMEMs (Pulmonary Artery Pressure Sensor)* Please call Matthew Folks, CardioMEMs Program Coordinator [phone] or page Heart Failure Rounding Team if Ruben Wood is admitted or presents to ED*      ??? Ischemic cardiomyopathy 01/12/2016   ??? 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 Wound of skin 01/12/2016   ??? 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 Varicose veins of left lower extremity with pain 01/05/2016   ??? 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 Obesity, morbid (HCC) 11/25/2015   ??? Varicose veins of leg with pain, left 11/25/2015   ??? Pain of left lower extremity 11/25/2015   ??? Hypercholesterolemia 11/25/2015   ??? Obesity (BMI 35.0-39.9 without comorbidity) 11/25/2015   ??? Mixed restrictive and obstructive lung disease (HCC) 06/18/2015     Mixed obstructive and restrictive on PFT's  Obstructions-  Smoker quit- 80 pyh  Allergic asthma- with chronic sinusitis and allergic rhinitis    Restrictive-  Kyphosis, obesity, chest wall reconstruction    Inhalers  Symbicort- prn  Albuterol  Singulair  duoneb ??? Bronchiectasis without complication (HCC) 06/18/2015     -flutter  -non sputum producer     ??? Chronic obstructive pulmonary disease (HCC) 03/10/2015   ??? Abnormal cortisol level 12/25/2014   ??? Venous stasis dermatitis of both lower extremities 11/04/2014   ??? Sacroiliac dysfunction 07/28/2014   ??? 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 Chronic combined systolic and diastolic congestive heart failure, NYHA class 2 (HCC) 07/19/2014     11/04/14: EF 20%, severely dilated LV with grade 1 diastolic dysfunction.  11/12/2014 In clinic today, Ruben Wood appears fairly well compensated.  He does have some LEE, but no significant rales (just some at bases), no JVD sitting upright, and no sx to suggest decompensation.  We will continue metoprolol, spironolactone, and bumex at current doses.    11/17/2014 Edema much improved in lower ext, Cr trending down on last labs.  Will recheck labs today.  Advised to weigh himself daily.  Rechecking BMP today to ensure not overdoing diuretics.     On metoprolol, spironolactone.  Will need to explore allergy to ARB more as Ruben Wood may benefit from ACEI.      03/29/14 CardioMEMS implant by Dr. Chales Abrahams  1. Normal cardiac output and cardiac index.  2. Normal pulmonary pressures and normal pulmonary capillary wedge pressure.  3. Successful insertion of CardioMEMS pulmonary artery pressure sensor     ??? Cardiomyopathy (HCC) 07/19/2014       CardioMEMS Primary Screening                                  Date: 02/23/2015        Heart failure admission within last 12 months                  Date: 11/03/14 Yes        NYHA HF Class III Yes        Able to take dual antiplatelet or anticoagulants for 1 month post implant Yes   Additional Screening if all above are yes        Ruben Wood with active infection No        History of recurrent PE or  DVT No        Unable to tolerate RHC No        GFR < 25 mL/min and non-responsive to diuretics or on chronic renal dialysis No Congenital heart disease or mechanical right heart valve No        CRT device implanted within last 3 months?          Date of Implant:  No        BMI > 35, axillary chest circumference > 165 cm                       140 cm  No        Unwilling to transmit device date or concerns with Ruben Wood compliance No        ??? 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      ??? CKD (chronic kidney disease) stage 3, GFR 30-59 ml/min (HCC) 08/26/2013   ??? Mixed dyslipidemia 08/26/2013     Hepatic Function    Lab Results   Component Value Date/Time    ALBUMIN 4.0 11/04/2014 12:26 AM    TOTAL PROTEIN 6.9 11/04/2014 12:26 AM    ALK PHOSPHATASE 61 11/04/2014 12:26 AM    Lab Results   Component Value Date/Time    AST (SGOT) 41* 11/04/2014 12:26 AM    ALT (SGPT) 19 11/04/2014 12:26 AM    TOTAL BILIRUBIN 1.4* 11/04/2014 12:26 AM        Lab Results   Component Value Date    CHOL 148 12/15/2013    TRIG 122 12/15/2013    HDL 51 12/15/2013    LDL 88 12/15/2013    VLDL 24 12/15/2013    NONHDLCHOL 97 12/15/2013       BP Readings from Last 3 Encounters:   11/12/14 129/80   11/09/14 111/44   11/04/14 146/65     Plan:   76 y.o. with known CAD with CTO of RCA and obtuse marginal branch with high grade stenosis (90%) in mid left circ.    Advised heart healthy diet and daily exercise.    Continue high intensity statin, atorvastatin       ??? AAA (abdominal aortic aneurysm) (HCC) 10/15/2012     Duplex done at OSH in 2007 and 2008 ~ 4.1 cm    Duplex 10/15/12-AAA 7.3 cm AP x 7.3 cm       ??? History of MRSA infection 10/14/2012 ??? Chronic cough 08/29/2012   ??? CAD (coronary artery disease) 08/15/2012     07/11/2002- Cath @ [facility] showed Severe double vessel disease(OMB of circumflex and distal circ)  inferior-basilar dyskinesis.                  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    09/12/12   Cath - [facility]:  CTO of the right coronary artery, CTO of the obtuse marginal branch and high-grade stenosis of  90% in the mid left circumflex artery No significant stenosis in the left anterior descending artery or left main vessel     Failed attempt in opening 2nd obtuse marginal CTO as described above.    10/14/12: Unsuccessful attempt to open CTO of OMB and mid-CFX by Dr. Mackey Birchwood         ??? OSA on CPAP 08/13/2012     CPAP at  DME: [facility]     ??? GERD (gastroesophageal reflux disease) 08/13/2012     -PPI  -follows with GI     ??? 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  Discussed the importance of diet.  Exercise as tolerated.        ??? Essential hypertension 08/13/2012   ??? Chest wall deformity 07/09/2012     Chest wall reconstruction on 07/09/12  Lung herniation- bio bridge           Review of Systems   Constitution: Negative for weight loss.   Cardiovascular: Positive for dyspnea on exertion and leg swelling.        Improving   Respiratory: Negative for cough.    Hematologic/Lymphatic: Negative for bleeding problem.   Musculoskeletal: Negative for muscle cramps.   Gastrointestinal: Negative for bloating and change in bowel habit.   Genitourinary: Negative for dysuria. Neurological: Negative for dizziness and light-headedness.   Psychiatric/Behavioral: Negative for altered mental status.       Physical Exam  General Appearance:  in no acute distress, sitting up in a wheelchair  Skin: warm, dry  Digits: no cyanosis or clubbing   Eyes: conjunctivae and lids normal, pupils are equal and round, sclera non-icteric   Lips & Oral Mucosa: no pallor or cyanosis   Neck: JVP unable to assess due to neck circumference  Pulmonology/Chest: CTAB, no rales/ rhonchi/ wheezing   Cardiac: RRR, no rub or gallop noted, no obvious murmur  GI: soft, non-tender, normal bowel sounds  Musculoskeletal:  No edema in RLE, trace edema in LLE with ace wrap on due to cellulitis   Neurological:  A&O x3, no focal deficits, seems to comprehend information       Cardiovascular Studies  Results for Ruben Wood, Ruben A JR.    08/17/2016 14:20   Sodium 134 (L)   Potassium 5.0   Chloride 98   CO2 26   Anion Gap 10   Blood Urea Nitrogen 64 (H)   Creatinine 2.05 (H)   eGFR Non African American 32 (L)   eGFR African American 38 (L)   Glucose 137 (H)   Calcium 9.6   B Type Natriuretic Peptide 81.0       Problems Addressed Today  Encounter Diagnoses   Name Primary?   ??? Chronic combined systolic and diastolic congestive heart failure (HCC) Yes   ??? CKD (chronic kidney disease) stage 3, GFR 30-59 ml/min (HCC)    ??? Ischemic cardiomyopathy        Assessment and Plan     1. Chronic combined systolic and diastolic heart failure with improved but continued reduced LVEF of 40% on most recent echo 05/17/16 (previously 25% with grade 1 LV diastolic dysfunction, dilated LV at 6.0 cm, and moderate MR on echocardiogram 10/27/2015). ???He continues to have  CardioMEMS PA sensor monitor which was placed 04/09/15. CardioMEMS PAD reading has been ranging 11-12 over the last week. His weight has improved to 292.6 lbs (from 306 lb son 08/08/16).  BMP results from today were discussed with the Ruben Wood. His creatinine has increased to 2.05 and BUN to 64 (from 1.38 & 33 respectively on 08/08/16).  He might require a little higher creatinine to stay in the euvolemic state, however since his sodium is down to 134 from 137, I discussed reducing down Bumex to 3 mg BID from 4 mg BID, however, Ruben Wood wants to continue the 4 mg dose for now.  He is scheduled to see Marlynn Perking, APRN, on 08/25/2016 for reevaluation.  Would recommend obtaining a BMP at that time. He will continue with Spironolactone 25 mg BID and Metoprolol XL 25 mg daily.  Importance of daily weight monitoring, low-salt diet, and fluid restriction were discussed.  2. Ischemic cardiomyopathy with LVEF 40%. ???He will continue Metoprolol XL  25 mg daily. He has been off losartan due to his renal function.   3. Status post CRT-D placement On 11/25/2015 by Dr. Betti Cruz. ???He continues to have regular schedule device interrogations per Dr. Jae Dire office.   4. CKD, stage III. Today's repeat BMP results as above.       Total time 30 minutes. Estimated counseling time 20 minutes regarding heart failure disease process, treatment options, treatment risks/benefits, medication instructions/interactions, and answering all questions related to the care plan while educating on the importance of adherence to recommended therapies, outpatient follow-up, and awareness of when and whom call if needed.        Thank you for involving me to participate in this Ruben Wood's care.  Please call with questions and concerns.    Dalayna Lauter, PA-C  CP:  Dr. Vivianne Spence  [facility]  [phone]         Current Medications (including today's revisions)  ??? acetaminophen (TYLENOL) 500 mg tablet Take 2 tablets by mouth three times daily. Max of 4,000 mg of acetaminophen in 24 hours.   ??? albuterol (PROAIR HFA, VENTOLIN HFA, OR PROVENTIL HFA) 90 mcg/actuation inhaler Inhale 2 puffs by mouth into the lungs every 4 hours as needed for Wheezing or Shortness of Breath. ??? albuterol-ipratropium (DUONEB) 0.5 mg-3 mg(2.5 mg base)/3 mL nebulizer solution Inhale 3 mL solution by nebulizer as directed every 6 hours as needed for Wheezing.   ??? allopurinol (ZYLOPRIM) 300 mg tablet Take 1 tablet by mouth daily.   ??? AMITRIPTYLINE HCL (AMITRIPTYLINE/GABAPENTIN/EMU OIL(#)) 05/24/08 % Apply 5 g topically to affected area three times daily.   ??? aspirin EC 81 mg tablet Take 81 mg by mouth daily.   ??? atorvastatin (LIPITOR) 20 mg tablet TAKE ONE TABLET BY MOUTH ONCE DAILY   ??? benzonatate (TESSALON PERLES) 100 mg capsule Take 1 capsule by mouth three times daily as needed for Cough.   ??? budesonide/formoterol (SYMBICORT HFA) 160/4.5 mcg inhalation Inhale 2 puffs by mouth into the lungs twice daily. Rinse mouth after use.   ??? bumetanide (BUMEX) 2 mg tablet Take 2 tablets by mouth twice daily.   ??? cephalexin (KEFLEX) 500 mg capsule Take 500 mg by mouth three times daily.   ??? cetirizine (ZYRTEC) 10 mg tablet Take 10 mg by mouth every morning.   ??? cholecalciferol (VITAMIN D-3) 1,000 units tablet Take 1,000 Units by mouth daily.   ??? citalopram (CELEXA) 20 mg tablet Take 1 tablet by mouth daily.   ??? codeine-guaiFENesin (ROBITUSSIN-AC) 10-100 mg/5 mL  oral solution Take 5 mL by mouth every 4 hours as needed for Cough.   ??? Flunisolide 25 mcg (0.025 %) spry Apply 2 sprays to each nostril as directed twice daily.   ??? gabapentin (NEURONTIN) 400 mg capsule Take 400 mg by mouth every 8 hours.   ??? GUAIFENESIN (MUCINEX PO) Take 1 tablet by mouth as Needed.   ??? ipratropium bromide (ATROVENT) 42 mcg (0.06 %) nasal spray 1 spray to each nostril Q6H PRN   ??? L.ACID/L.CASEI/B.BIF/B.LON/FOS (PROBIOTIC BLEND PO) Take 1 Cap by mouth.   ??? lidocaine (LIDODERM) 5 % topical patch Apply 1 patch topically to affected area every 24 hours. Apply patch for 12 hours, then remove for 12 hours before repeating.   ??? lutein-zeaxanthin 25-5 mg cap Take  by mouth.   ??? metoprolol XL (TOPROL XL) 25 mg extended release tablet Take 1 tablet by mouth daily.   ??? montelukast (SINGULAIR) 10 mg tablet TAKE ONE TABLET BY MOUTH AT BEDTIME. MAINTENANCE THERAPY FOR ASTHMA.   ??? mupirocin (BACTROBAN) 2 % topical ointment    ??? nitroglycerin (NITROSTAT) 0.4 mg tablet Place 1 Tab under tongue every 5 minutes as needed for Chest Pain.   ??? oxyCODONE (ROXICODONE, OXY-IR) 5 mg tablet Take 1 tablet by mouth every 12 hours as needed for Pain (Ruben Wood taking differently: Take 5 mg by mouth three times daily )   ??? pantoprazole DR (PROTONIX) 40 mg tablet Take 1 Tab by mouth twice daily.   ??? potassium chloride SR (K-DUR) 20 mEq tablet Take 1 tablet by mouth daily. Take with a meal and a full glass of water.   ??? ranitidine(+) (ZANTAC) 150 mg tablet TAKE ONE TABLET BY MOUTH ONCE DAILY   ??? senna/docusate (SENOKOT-S) 8.6/50 mg tablet Take 1 Tab by mouth twice daily.   ??? sildenafil(+) (VIAGRA) 50 mg tablet Take 1 tablet by mouth as Needed for Erectile dysfunction.   ??? spironolactone (ALDACTONE) 25 mg tablet Take 1 tablet by mouth twice daily.   ??? tiZANidine (ZANAFLEX) 4 mg tablet Take 1 tablet by mouth every 6 hours as needed.

## 2016-08-18 ENCOUNTER — Encounter: Admit: 2016-08-18 | Discharge: 2016-08-18 | Payer: MEDICARE

## 2016-08-18 MED ORDER — BUMETANIDE 2 MG PO TAB
4 mg | ORAL_TABLET | Freq: Two times a day (BID) | ORAL | 3 refills | Status: AC
Start: 2016-08-18 — End: 2016-08-25

## 2016-08-18 NOTE — Telephone Encounter
Message received from [facility] with the [facility] requesting clarification on Bumex script sent to they yesterday. Script sent in for 2 mg tabs with instructions to take 2 tabs bid with quantity # 60. Hina Baig, PA-C noted "I discussed reducing down Bumex to 3 mg BID from 4 mg BID, however, patient wants to continue the 4 mg dose for now".  Updated script sent with correct quantity. Called pharmacy and informed of updated script sent.

## 2016-08-22 ENCOUNTER — Encounter: Admit: 2016-08-22 | Discharge: 2016-08-22 | Payer: MEDICARE

## 2016-08-22 ENCOUNTER — Ambulatory Visit: Admit: 2016-08-22 | Discharge: 2016-08-23 | Payer: MEDICARE

## 2016-08-22 DIAGNOSIS — G4730 Sleep apnea, unspecified: ICD-10-CM

## 2016-08-22 DIAGNOSIS — I83811 Varicose veins of right lower extremities with pain: Principal | ICD-10-CM

## 2016-08-22 DIAGNOSIS — K219 Gastro-esophageal reflux disease without esophagitis: ICD-10-CM

## 2016-08-22 DIAGNOSIS — J302 Other seasonal allergic rhinitis: ICD-10-CM

## 2016-08-22 DIAGNOSIS — I429 Cardiomyopathy, unspecified: ICD-10-CM

## 2016-08-22 DIAGNOSIS — M954 Acquired deformity of chest and rib: ICD-10-CM

## 2016-08-22 DIAGNOSIS — L03116 Cellulitis of left lower limb: ICD-10-CM

## 2016-08-22 DIAGNOSIS — I10 Essential (primary) hypertension: Principal | ICD-10-CM

## 2016-08-22 DIAGNOSIS — I5042 Chronic combined systolic (congestive) and diastolic (congestive) heart failure: ICD-10-CM

## 2016-08-22 DIAGNOSIS — I83029 Varicose veins of left lower extremity with ulcer of unspecified site: ICD-10-CM

## 2016-08-22 DIAGNOSIS — R609 Edema, unspecified: ICD-10-CM

## 2016-08-22 DIAGNOSIS — Z8679 Personal history of other diseases of the circulatory system: ICD-10-CM

## 2016-08-22 DIAGNOSIS — Z95828 Presence of other vascular implants and grafts: ICD-10-CM

## 2016-08-22 DIAGNOSIS — I2510 Atherosclerotic heart disease of native coronary artery without angina pectoris: Secondary | ICD-10-CM

## 2016-08-22 DIAGNOSIS — J449 Chronic obstructive pulmonary disease, unspecified: ICD-10-CM

## 2016-08-22 DIAGNOSIS — I714 Abdominal aortic aneurysm, without rupture: ICD-10-CM

## 2016-08-22 DIAGNOSIS — Z8614 Personal history of Methicillin resistant Staphylococcus aureus infection: ICD-10-CM

## 2016-08-22 DIAGNOSIS — R05 Cough: ICD-10-CM

## 2016-08-22 DIAGNOSIS — Z9981 Dependence on supplemental oxygen: ICD-10-CM

## 2016-08-22 DIAGNOSIS — R0600 Dyspnea, unspecified: ICD-10-CM

## 2016-08-22 DIAGNOSIS — G4733 Obstructive sleep apnea (adult) (pediatric): ICD-10-CM

## 2016-08-22 DIAGNOSIS — N183 Chronic kidney disease, stage 3 (moderate): ICD-10-CM

## 2016-08-22 DIAGNOSIS — E669 Obesity, unspecified: ICD-10-CM

## 2016-08-22 DIAGNOSIS — I509 Heart failure, unspecified: ICD-10-CM

## 2016-08-22 DIAGNOSIS — J45909 Unspecified asthma, uncomplicated: ICD-10-CM

## 2016-08-22 DIAGNOSIS — E785 Hyperlipidemia, unspecified: ICD-10-CM

## 2016-08-22 DIAGNOSIS — E782 Mixed hyperlipidemia: ICD-10-CM

## 2016-08-22 DIAGNOSIS — J479 Bronchiectasis, uncomplicated: ICD-10-CM

## 2016-08-22 DIAGNOSIS — J189 Pneumonia, unspecified organism: ICD-10-CM

## 2016-08-23 ENCOUNTER — Ambulatory Visit: Admit: 2016-08-23 | Discharge: 2016-08-23 | Payer: MEDICARE

## 2016-08-23 DIAGNOSIS — I428 Other cardiomyopathies: Principal | ICD-10-CM

## 2016-08-23 DIAGNOSIS — I5042 Chronic combined systolic (congestive) and diastolic (congestive) heart failure: ICD-10-CM

## 2016-08-24 ENCOUNTER — Encounter: Admit: 2016-08-24 | Discharge: 2016-08-24 | Payer: MEDICARE

## 2016-08-24 DIAGNOSIS — Z95810 Presence of automatic (implantable) cardiac defibrillator: Principal | ICD-10-CM

## 2016-08-25 ENCOUNTER — Ambulatory Visit: Admit: 2016-08-25 | Discharge: 2016-08-25 | Payer: MEDICARE

## 2016-08-25 ENCOUNTER — Encounter: Admit: 2016-08-25 | Discharge: 2016-08-25 | Payer: MEDICARE

## 2016-08-25 DIAGNOSIS — E669 Obesity, unspecified: ICD-10-CM

## 2016-08-25 DIAGNOSIS — N183 Chronic kidney disease, stage 3 (moderate): ICD-10-CM

## 2016-08-25 DIAGNOSIS — I5042 Chronic combined systolic (congestive) and diastolic (congestive) heart failure: Principal | ICD-10-CM

## 2016-08-25 DIAGNOSIS — G4733 Obstructive sleep apnea (adult) (pediatric): ICD-10-CM

## 2016-08-25 DIAGNOSIS — J189 Pneumonia, unspecified organism: ICD-10-CM

## 2016-08-25 DIAGNOSIS — K219 Gastro-esophageal reflux disease without esophagitis: ICD-10-CM

## 2016-08-25 DIAGNOSIS — I509 Heart failure, unspecified: ICD-10-CM

## 2016-08-25 DIAGNOSIS — I429 Cardiomyopathy, unspecified: ICD-10-CM

## 2016-08-25 DIAGNOSIS — J45909 Unspecified asthma, uncomplicated: ICD-10-CM

## 2016-08-25 DIAGNOSIS — R05 Cough: ICD-10-CM

## 2016-08-25 DIAGNOSIS — L03116 Cellulitis of left lower limb: ICD-10-CM

## 2016-08-25 DIAGNOSIS — M954 Acquired deformity of chest and rib: ICD-10-CM

## 2016-08-25 DIAGNOSIS — Z8614 Personal history of Methicillin resistant Staphylococcus aureus infection: ICD-10-CM

## 2016-08-25 DIAGNOSIS — E782 Mixed hyperlipidemia: ICD-10-CM

## 2016-08-25 DIAGNOSIS — I255 Ischemic cardiomyopathy: ICD-10-CM

## 2016-08-25 DIAGNOSIS — I2510 Atherosclerotic heart disease of native coronary artery without angina pectoris: Secondary | ICD-10-CM

## 2016-08-25 DIAGNOSIS — R609 Edema, unspecified: ICD-10-CM

## 2016-08-25 DIAGNOSIS — I10 Essential (primary) hypertension: Secondary | ICD-10-CM

## 2016-08-25 DIAGNOSIS — J302 Other seasonal allergic rhinitis: ICD-10-CM

## 2016-08-25 DIAGNOSIS — J449 Chronic obstructive pulmonary disease, unspecified: ICD-10-CM

## 2016-08-25 DIAGNOSIS — Z9981 Dependence on supplemental oxygen: ICD-10-CM

## 2016-08-25 DIAGNOSIS — Z95828 Presence of other vascular implants and grafts: ICD-10-CM

## 2016-08-25 DIAGNOSIS — Z8679 Personal history of other diseases of the circulatory system: ICD-10-CM

## 2016-08-25 DIAGNOSIS — R0600 Dyspnea, unspecified: ICD-10-CM

## 2016-08-25 DIAGNOSIS — I714 Abdominal aortic aneurysm, without rupture: ICD-10-CM

## 2016-08-25 DIAGNOSIS — G4730 Sleep apnea, unspecified: ICD-10-CM

## 2016-08-25 DIAGNOSIS — E785 Hyperlipidemia, unspecified: ICD-10-CM

## 2016-08-25 DIAGNOSIS — J479 Bronchiectasis, uncomplicated: ICD-10-CM

## 2016-08-25 LAB — BASIC METABOLIC PANEL
Lab: 1.3 mg/dL — ABNORMAL HIGH (ref 0.4–1.24)
Lab: 101 MMOL/L (ref 98–110)
Lab: 136 MMOL/L — ABNORMAL LOW (ref 137–147)
Lab: 143 mg/dL — ABNORMAL HIGH (ref 70–100)
Lab: 26 MMOL/L (ref 21–30)
Lab: 4.4 MMOL/L (ref 3.5–5.1)
Lab: 50 mL/min — ABNORMAL LOW (ref >60)
Lab: 51 mg/dL — ABNORMAL HIGH (ref 7–25)
Lab: 60 mL/min — ABNORMAL LOW (ref >60)
Lab: 9 (ref 3–12)

## 2016-08-25 MED ORDER — BUMETANIDE 2 MG PO TAB
4 mg | ORAL_TABLET | Freq: Two times a day (BID) | ORAL | 3 refills | Status: AC
Start: 2016-08-25 — End: 2016-10-09

## 2016-08-25 NOTE — Progress Notes
Date of Service: 08/22/2016              Chief Complaint   Patient presents with   ??? Post Procedure     endovenous ablation of right small saphenous vein and microphlebectomies   ??? Wound Check     Left calf       History of Present Illness  76 y.o. male with a history of venous disease underwent right small saphenous ablation with right leg microphlebectomies by Dr. Junita Push on 07/06/2016.  Pt reports no residual varicose veins, no pain and no swelling of the right leg.  Pt does report new blisters to the left lower leg.  He was evaluated by his primary care physician for an episode of cellulitis of the left leg about a month ago.  He has been on Cipro for this and the leg was started to look and feel better.  He no longer had any redness or swelling.  Pt saw his primary care physician on 08/21/2016 and they had discontinued his unna boot compression wrap.  Evaluation today shows blisters to the left medial calf, no associated redness, odor, pain.  Blisters are draining clear serous fluid.            Past Medical History:   Diagnosis Date   ??? AAA (abdominal aortic aneurysm) (HCC) 10/15/2012   ??? Asthma    ??? Bronchiectasis (HCC)    ??? CAD (coronary artery disease) 08/15/2012    07/11/2002- Cath @ [facility] showed Severe double vessel disease(OMB of circumflex and distal circ)  inferior-basilar dyskinesis.                 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  09/12/12   Cath    ??? Cardiomyopathy (HCC) 07/19/2014   ??? Cellulitis of left lower extremity 11/12/2014    10/2014: admitted with cellulitis, [place] negative for venous clot, MRI without osteomyelitis.    11/12/2014 In clinic today, still with cellulitis.  Per patient and his wife, the erythema may be extending. Cultures from drainage in hospital with MSSA, but Blood Cx negative.  No e/o osteomyelitis.  My concern is that this antibiotic regimen is not adequately treating his cellulitis.  We will broaden coverage to get MRSA with doxycycline.  Patient and wife given strict call/return criteria.  I will see patient in 3-4 days for re-evaluation.  No fever today.  Plan:  Stop cefpodoxime and start doxycylcine  11/17/2014 Much better, no warmth and erythema minimal.   Plan: continue doxycyline for full 10 day course.      ??? Chest wall deformity 07/09/2012    Chest wall reconstruction on 07/09/12    ??? CHF (congestive heart failure) (HCC)    ??? 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 Chronic cough 08/29/2012   ??? 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 CKD (chronic kidney disease) stage 3, GFR 30-59 ml/min (HCC) 08/26/2013   ??? COPD (chronic obstructive pulmonary disease) (HCC) 08/13/2012   ??? Dyspnea    ??? Edema    ??? Essential hypertension 08/13/2012   ??? GERD (gastroesophageal reflux disease) 08/13/2012   ??? History of CHF (congestive heart failure) 08/13/2012   ??? History of MRSA infection 10/14/2012   ??? History of repair of aneurysm of  abdominal aorta using endovascular stent graft 08/26/2013    10/25/12: Successful repair of an abdominal aortic aneurysm utilizing endovascular technique with a Gore Excluder device with 26 x 14 x 18 cm main endoprosthesis on the right and 13.5 cm contralateral leg device successfully delivered without evidence of any endoleaks and excellent results.    ??? HTN (hypertension)    ??? Hyperlipemia    ??? Hypertension 08/13/2012   ??? Mixed dyslipidemia 08/26/2013   ??? Morbid obesity (HCC) 08/13/2012   ??? 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  Discussed the importance of diet.  Exercise as tolerated.      ??? Obesity    ??? On supplemental oxygen therapy    ??? OSA on CPAP 08/13/2012   ??? Pneumonia 04/2012    [facility]   ??? Seasonal allergic reaction    ??? Sleep apnea        Past Surgical History:   Procedure Laterality Date   ??? BACK SURGERY  Jan 2016   ??? VARICOSE VEIN SURGERY Left 01/05/2016    left small saphenous endovenous ablation with left leg microphlebectomy-Dr. Junita Push   ??? HX MICROPHLEBECTOMY Right 07/06/2016    [facility]   ??? HX ENDOVENOUS ABLATION OF THE SMALL SAPHENOUS VEIN Right 07/06/2016    [facility]   ??? BRONCHOSCOPY     ??? CARDIAC CATHERIZATION  several years ago    [place]   ??? DOPPLER ECHOCARDIOGRAPHY     ??? HERNIA REPAIR     ??? HX CHOLECYSTECTOMY     ??? HX HEART CATHETERIZATION     ??? HX LUMBAR LAMINECTOMY     ??? LUNG SURGERY         Allergies:  No Known Allergies    Medication List:  ??? acetaminophen (TYLENOL) 500 mg tablet Take 2 tablets by mouth three times daily. Max of 4,000 mg of acetaminophen in 24 hours.   ??? albuterol (PROAIR HFA, VENTOLIN HFA, OR PROVENTIL HFA) 90 mcg/actuation inhaler Inhale 2 puffs by mouth into the lungs every 4 hours as needed for Wheezing or Shortness of Breath.   ??? albuterol-ipratropium (DUONEB) 0.5 mg-3 mg(2.5 mg base)/3 mL nebulizer solution Inhale 3 mL solution by nebulizer as directed every 6 hours as needed for Wheezing.   ??? allopurinol (ZYLOPRIM) 300 mg tablet Take 1 tablet by mouth daily.   ??? AMITRIPTYLINE HCL (AMITRIPTYLINE/GABAPENTIN/EMU OIL(#)) 05/24/08 % Apply 5 g topically to affected area three times daily.   ??? aspirin EC 81 mg tablet Take 81 mg by mouth daily.   ??? atorvastatin (LIPITOR) 20 mg tablet TAKE ONE TABLET BY MOUTH ONCE DAILY   ??? benzonatate (TESSALON PERLES) 100 mg capsule Take 1 capsule by mouth three times daily as needed for Cough.   ??? budesonide/formoterol (SYMBICORT HFA) 160/4.5 mcg inhalation Inhale 2 puffs by mouth into the lungs twice daily. Rinse mouth after use.   ??? bumetanide (BUMEX) 2 mg tablet Take 2 tablets by mouth twice daily.   ??? cephalexin (KEFLEX) 500 mg capsule Take 500 mg by mouth three times daily.   ??? cetirizine (ZYRTEC) 10 mg tablet Take 10 mg by mouth every morning.   ??? cholecalciferol (VITAMIN D-3) 1,000 units tablet Take 1,000 Units by mouth daily.   ???  citalopram (CELEXA) 20 mg tablet Take 1 tablet by mouth daily.   ??? codeine-guaiFENesin (ROBITUSSIN-AC) 10-100 mg/5 mL oral solution Take 5 mL by mouth every 4 hours as needed for Cough.   ??? Flunisolide 25 mcg (0.025 %) spry Apply 2 sprays to each nostril as directed twice daily.   ??? gabapentin (NEURONTIN) 400 mg capsule Take 400 mg by mouth every 8 hours.   ??? GUAIFENESIN (MUCINEX PO) Take 1 tablet by mouth as Needed.   ??? ipratropium bromide (ATROVENT) 42 mcg (0.06 %) nasal spray 1 spray to each nostril Q6H PRN   ??? L.ACID/L.CASEI/B.BIF/B.LON/FOS (PROBIOTIC BLEND PO) Take 1 Cap by mouth.   ??? lidocaine (LIDODERM) 5 % topical patch Apply 1 patch topically to affected area every 24 hours. Apply patch for 12 hours, then remove for 12 hours before repeating.   ??? lutein-zeaxanthin 25-5 mg cap Take  by mouth.   ??? metoprolol XL (TOPROL XL) 25 mg extended release tablet Take 1 tablet by mouth daily.   ??? montelukast (SINGULAIR) 10 mg tablet TAKE ONE TABLET BY MOUTH AT BEDTIME. MAINTENANCE THERAPY FOR ASTHMA.   ??? mupirocin (BACTROBAN) 2 % topical ointment    ??? nitroglycerin (NITROSTAT) 0.4 mg tablet Place 1 Tab under tongue every 5 minutes as needed for Chest Pain.   ??? oxyCODONE (ROXICODONE, OXY-IR) 5 mg tablet Take 1 tablet by mouth every 12 hours as needed for Pain (Patient taking differently: Take 5 mg by mouth three times daily )   ??? pantoprazole DR (PROTONIX) 40 mg tablet Take 1 Tab by mouth twice daily.   ??? potassium chloride SR (K-DUR) 20 mEq tablet Take 1 tablet by mouth daily. Take with a meal and a full glass of water. ??? ranitidine(+) (ZANTAC) 150 mg tablet TAKE ONE TABLET BY MOUTH ONCE DAILY   ??? senna/docusate (SENOKOT-S) 8.6/50 mg tablet Take 1 Tab by mouth twice daily.   ??? sildenafil(+) (VIAGRA) 50 mg tablet Take 1 tablet by mouth as Needed for Erectile dysfunction.   ??? spironolactone (ALDACTONE) 25 mg tablet Take 1 tablet by mouth twice daily.   ??? tiZANidine (ZANAFLEX) 4 mg tablet Take 1 tablet by mouth every 6 hours as needed.       Social History:   reports that he quit smoking about 16 years ago. He has a 80.00 pack-year smoking history. He has never used smokeless tobacco. He reports that he drinks alcohol. He reports that he does not use drugs.    Family History   Problem Relation Age of Onset   ??? Cancer Mother      liver   ??? Stroke Sister    ??? Cancer Father    ??? Coronary Artery Disease Father        Review of Systems            Vitals:    08/22/16 1120   BP: 128/72   Weight: 132.5 kg (292 lb)   Height: 177.8 cm (70)     Body mass index is 41.9 kg/m???.     Physical Exam   Constitutional: He is oriented to person, place, and time. He appears well-developed and well-nourished.   Neurological: He is alert and oriented to person, place, and time.   Skin: Skin is warm and dry.   Right leg with no residual varicose veins. Microphlebectomy sites are well healed with mild scar tissue.   A few telangectasia present.  No swelling of the leg, ankle, foot.  No skin changes, no skin breakdown.  Left leg with multiple serous fluid filled blisters to the medial calf. One blister is broke open and draining clear fluid. No redness or odor noted.           Assessment and Plan:  76 y.o. male with a history of venous disease underwent right small saphenous ablation with right leg microphlebectomies by Dr. Junita Push on 07/06/2016.  Pt reports no residual varicose veins, no pain and no swelling of the right leg.  Pt does report new blisters to the left lower leg.  He was evaluated by his primary care physician for an episode of cellulitis of the left leg about a month ago.  He has been on Cipro for this and the leg was started to look and feel better.  He no longer had any redness or swelling.  Pt saw his primary care physician on 08/21/2016 and they had discontinued his unna boot compression wrap.  Evaluation today shows blisters to the left medial calf, no associated redness, odor, pain.  Blisters are draining clear serous fluid.     Pt will restart dressing changes to the left leg with xeroform gauze, absorbant pad and roll gauze to secure, he will change the dressing daily.  Discussed with Dr. Junita Push and he agrees.  Pt will call if new concerns arise and will return to see Dr. Junita Push in a week.    1. Varicose veins of leg with pain, right     2. Venous ulcer of left leg (HCC)

## 2016-08-29 ENCOUNTER — Ambulatory Visit: Admit: 2016-08-29 | Discharge: 2016-08-30 | Payer: MEDICARE

## 2016-08-29 ENCOUNTER — Encounter: Admit: 2016-08-29 | Discharge: 2016-08-29 | Payer: MEDICARE

## 2016-08-29 DIAGNOSIS — I5042 Chronic combined systolic (congestive) and diastolic (congestive) heart failure: ICD-10-CM

## 2016-08-29 DIAGNOSIS — I83022 Varicose veins of left lower extremity with ulcer of calf: Principal | ICD-10-CM

## 2016-08-29 DIAGNOSIS — R609 Edema, unspecified: ICD-10-CM

## 2016-08-29 DIAGNOSIS — I83812 Varicose veins of left lower extremities with pain: ICD-10-CM

## 2016-08-29 DIAGNOSIS — Z8679 Personal history of other diseases of the circulatory system: ICD-10-CM

## 2016-08-29 DIAGNOSIS — N183 Chronic kidney disease, stage 3 (moderate): ICD-10-CM

## 2016-08-29 DIAGNOSIS — G4730 Sleep apnea, unspecified: ICD-10-CM

## 2016-08-29 DIAGNOSIS — I714 Abdominal aortic aneurysm, without rupture: ICD-10-CM

## 2016-08-29 DIAGNOSIS — Z9981 Dependence on supplemental oxygen: ICD-10-CM

## 2016-08-29 DIAGNOSIS — J45909 Unspecified asthma, uncomplicated: ICD-10-CM

## 2016-08-29 DIAGNOSIS — J189 Pneumonia, unspecified organism: ICD-10-CM

## 2016-08-29 DIAGNOSIS — J479 Bronchiectasis, uncomplicated: ICD-10-CM

## 2016-08-29 DIAGNOSIS — J302 Other seasonal allergic rhinitis: ICD-10-CM

## 2016-08-29 DIAGNOSIS — L03116 Cellulitis of left lower limb: ICD-10-CM

## 2016-08-29 DIAGNOSIS — E669 Obesity, unspecified: ICD-10-CM

## 2016-08-29 DIAGNOSIS — K219 Gastro-esophageal reflux disease without esophagitis: ICD-10-CM

## 2016-08-29 DIAGNOSIS — R05 Cough: ICD-10-CM

## 2016-08-29 DIAGNOSIS — T148XXA Other injury of unspecified body region, initial encounter: ICD-10-CM

## 2016-08-29 DIAGNOSIS — I2510 Atherosclerotic heart disease of native coronary artery without angina pectoris: Secondary | ICD-10-CM

## 2016-08-29 DIAGNOSIS — J449 Chronic obstructive pulmonary disease, unspecified: ICD-10-CM

## 2016-08-29 DIAGNOSIS — G4733 Obstructive sleep apnea (adult) (pediatric): ICD-10-CM

## 2016-08-29 DIAGNOSIS — I429 Cardiomyopathy, unspecified: ICD-10-CM

## 2016-08-29 DIAGNOSIS — R0600 Dyspnea, unspecified: ICD-10-CM

## 2016-08-29 DIAGNOSIS — Z8614 Personal history of Methicillin resistant Staphylococcus aureus infection: ICD-10-CM

## 2016-08-29 DIAGNOSIS — E782 Mixed hyperlipidemia: ICD-10-CM

## 2016-08-29 DIAGNOSIS — E785 Hyperlipidemia, unspecified: ICD-10-CM

## 2016-08-29 DIAGNOSIS — I509 Heart failure, unspecified: ICD-10-CM

## 2016-08-29 DIAGNOSIS — M954 Acquired deformity of chest and rib: ICD-10-CM

## 2016-08-29 DIAGNOSIS — I10 Essential (primary) hypertension: Principal | ICD-10-CM

## 2016-08-29 DIAGNOSIS — Z95828 Presence of other vascular implants and grafts: ICD-10-CM

## 2016-08-29 NOTE — Progress Notes
Date of Service: 08/29/2016              Chief Complaint   Patient presents with   ??? Wound Check     left calf Upper anterior wound 3.5X3.3CM, lower anterior wound 5X2cm       History of Present Illness       76 year old gentleman who presents for evaluation of a new ulcer over the left calf, medial aspect.  He is status post bilateral small saphenous endovenous ablation with microphlebectomy's in the past 6 months.  Following that procedure, he had complete healing of the wounds of his left lower extremity.  He states that he has mild persistent swelling and then new onset of the wound which is being treated at the wound care center with an Radio broadcast assistant.      Past Medical History:   Diagnosis Date   ??? AAA (abdominal aortic aneurysm) (HCC) 10/15/2012   ??? Asthma    ??? Bronchiectasis (HCC)    ??? CAD (coronary artery disease) 08/15/2012    07/11/2002- Cath @ [facility] showed Severe double vessel disease(OMB of circumflex and distal circ)  inferior-basilar dyskinesis.                 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  09/12/12   Cath    ??? Cardiomyopathy (HCC) 07/19/2014   ??? Cellulitis of left lower extremity 11/12/2014    10/2014: admitted with cellulitis, [place] negative for venous clot, MRI without osteomyelitis.    11/12/2014 In clinic today, still with cellulitis.  Per patient and his wife, the erythema may be extending.  Cultures from drainage in hospital with MSSA, but Blood Cx negative.  No e/o osteomyelitis.  My concern is that this antibiotic regimen is not adequately treating his cellulitis.  We will broaden coverage to get MRSA with doxycycline.  Patient and wife given strict call/return criteria.  I will see patient in 3-4 days for re-evaluation.  No fever today.  Plan: Stop cefpodoxime and start doxycylcine  11/17/2014 Much better, no warmth and erythema minimal.   Plan: continue doxycyline for full 10 day course.      ??? Chest wall deformity 07/09/2012    Chest wall reconstruction on 07/09/12    ??? CHF (congestive heart failure) (HCC)    ??? 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 Chronic cough 08/29/2012   ??? 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 CKD (chronic kidney disease) stage 3, GFR 30-59 ml/min (HCC) 08/26/2013   ??? COPD (chronic obstructive pulmonary disease) (HCC) 08/13/2012   ??? Dyspnea    ??? Edema    ??? Essential hypertension 08/13/2012   ??? GERD (gastroesophageal reflux disease) 08/13/2012   ??? History of CHF (congestive heart failure) 08/13/2012   ??? History of MRSA infection 10/14/2012   ??? 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    ??? HTN (hypertension)    ??? Hyperlipemia    ??? Hypertension 08/13/2012   ???  Mixed dyslipidemia 08/26/2013   ??? Morbid obesity (HCC) 08/13/2012   ??? 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  Discussed the importance of diet.  Exercise as tolerated.      ??? Obesity    ??? On supplemental oxygen therapy    ??? OSA on CPAP 08/13/2012   ??? Pneumonia 04/2012    [facility]   ??? Seasonal allergic reaction    ??? Sleep apnea        Past Surgical History:   Procedure Laterality Date   ??? BACK SURGERY  Jan 2016 ??? VARICOSE VEIN SURGERY Left 01/05/2016    left small saphenous endovenous ablation with left leg microphlebectomy-Dr. Junita Push   ??? HX MICROPHLEBECTOMY Right 07/06/2016    [facility]   ??? HX ENDOVENOUS ABLATION OF THE SMALL SAPHENOUS VEIN Right 07/06/2016    [facility]   ??? BRONCHOSCOPY     ??? CARDIAC CATHERIZATION  several years ago    [place]   ??? DOPPLER ECHOCARDIOGRAPHY     ??? HERNIA REPAIR     ??? HX CHOLECYSTECTOMY     ??? HX HEART CATHETERIZATION     ??? HX LUMBAR LAMINECTOMY     ??? LUNG SURGERY         Allergies:  No Known Allergies    Medication List:  ??? acetaminophen (TYLENOL) 500 mg tablet Take 2 tablets by mouth three times daily. Max of 4,000 mg of acetaminophen in 24 hours.   ??? albuterol (PROAIR HFA, VENTOLIN HFA, OR PROVENTIL HFA) 90 mcg/actuation inhaler Inhale 2 puffs by mouth into the lungs every 4 hours as needed for Wheezing or Shortness of Breath.   ??? albuterol-ipratropium (DUONEB) 0.5 mg-3 mg(2.5 mg base)/3 mL nebulizer solution Inhale 3 mL solution by nebulizer as directed every 6 hours as needed for Wheezing.   ??? allopurinol (ZYLOPRIM) 300 mg tablet Take 1 tablet by mouth daily.   ??? AMITRIPTYLINE HCL (AMITRIPTYLINE/GABAPENTIN/EMU OIL(#)) 05/24/08 % Apply 5 g topically to affected area three times daily.   ??? aspirin EC 81 mg tablet Take 81 mg by mouth daily.   ??? atorvastatin (LIPITOR) 20 mg tablet TAKE ONE TABLET BY MOUTH ONCE DAILY   ??? benzonatate (TESSALON PERLES) 100 mg capsule Take 1 capsule by mouth three times daily as needed for Cough.   ??? budesonide/formoterol (SYMBICORT HFA) 160/4.5 mcg inhalation Inhale 2 puffs by mouth into the lungs twice daily. Rinse mouth after use.   ??? bumetanide (BUMEX) 2 mg tablet Take 2 tablets by mouth twice daily.   ??? cephalexin (KEFLEX) 500 mg capsule Take 500 mg by mouth three times daily.   ??? cetirizine (ZYRTEC) 10 mg tablet Take 10 mg by mouth every morning.   ??? cholecalciferol (VITAMIN D-3) 1,000 units tablet Take 1,000 Units by mouth daily. ??? citalopram (CELEXA) 20 mg tablet Take 1 tablet by mouth daily.   ??? codeine-guaiFENesin (ROBITUSSIN-AC) 10-100 mg/5 mL oral solution Take 5 mL by mouth every 4 hours as needed for Cough.   ??? Flunisolide 25 mcg (0.025 %) spry Apply 2 sprays to each nostril as directed twice daily.   ??? gabapentin (NEURONTIN) 400 mg capsule Take 400 mg by mouth every 8 hours.   ??? GUAIFENESIN (MUCINEX PO) Take 1 tablet by  mouth as Needed.   ??? ipratropium bromide (ATROVENT) 42 mcg (0.06 %) nasal spray 1 spray to each nostril Q6H PRN   ??? L.ACID/L.CASEI/B.BIF/B.LON/FOS (PROBIOTIC BLEND PO) Take 1 Cap by mouth.   ??? lidocaine (LIDODERM) 5 % topical patch Apply 1 patch topically to affected area every 24 hours. Apply patch for 12 hours, then remove for 12 hours before repeating.   ??? lutein-zeaxanthin 25-5 mg cap Take  by mouth.   ??? metoprolol XL (TOPROL XL) 25 mg extended release tablet Take 1 tablet by mouth daily.   ??? montelukast (SINGULAIR) 10 mg tablet TAKE ONE TABLET BY MOUTH AT BEDTIME. MAINTENANCE THERAPY FOR ASTHMA.   ??? mupirocin (BACTROBAN) 2 % topical ointment    ??? nitroglycerin (NITROSTAT) 0.4 mg tablet Place 1 Tab under tongue every 5 minutes as needed for Chest Pain.   ??? oxyCODONE (ROXICODONE, OXY-IR) 5 mg tablet Take 1 tablet by mouth every 12 hours as needed for Pain (Patient taking differently: Take 5 mg by mouth three times daily )   ??? pantoprazole DR (PROTONIX) 40 mg tablet Take 1 Tab by mouth twice daily.   ??? potassium chloride SR (K-DUR) 20 mEq tablet Take 1 tablet by mouth daily. Take with a meal and a full glass of water.   ??? ranitidine(+) (ZANTAC) 150 mg tablet TAKE ONE TABLET BY MOUTH ONCE DAILY   ??? senna/docusate (SENOKOT-S) 8.6/50 mg tablet Take 1 Tab by mouth twice daily.   ??? sildenafil(+) (VIAGRA) 50 mg tablet Take 1 tablet by mouth as Needed for Erectile dysfunction.   ??? spironolactone (ALDACTONE) 25 mg tablet Take 1 tablet by mouth twice daily. ??? tiZANidine (ZANAFLEX) 4 mg tablet Take 1 tablet by mouth every 6 hours as needed.       Social History:   reports that he quit smoking about 16 years ago. He has a 80.00 pack-year smoking history. He has never used smokeless tobacco. He reports that he drinks alcohol. He reports that he does not use drugs.    Family History   Problem Relation Age of Onset   ??? Cancer Mother      liver   ??? Stroke Sister    ??? Cancer Father    ??? Coronary Artery Disease Father        Review of Systems   Constitutional: Positive for fatigue. Negative for activity change, appetite change, chills, diaphoresis, fever and unexpected weight change.   HENT: Positive for hearing loss. Negative for nosebleeds and tinnitus.    Eyes: Positive for photophobia. Negative for itching and visual disturbance.   Respiratory: Positive for apnea, cough and shortness of breath. Negative for chest tightness.    Cardiovascular: Positive for leg swelling. Negative for chest pain and palpitations.   Gastrointestinal: Negative for abdominal pain, blood in stool, constipation, diarrhea, nausea and vomiting.   Endocrine: Negative for cold intolerance and heat intolerance.   Genitourinary: Negative for dysuria, frequency, hematuria and urgency.   Musculoskeletal: Negative for arthralgias, back pain, gait problem, joint swelling, myalgias, neck pain and neck stiffness.   Skin: Negative for color change, rash and wound.   Allergic/Immunologic: Negative for environmental allergies, food allergies and immunocompromised state.   Neurological: Negative for dizziness, tremors, seizures, syncope, facial asymmetry, speech difficulty, weakness, light-headedness, numbness and headaches.   Hematological: Negative for adenopathy. Does not bruise/bleed easily.   Psychiatric/Behavioral: Negative for behavioral problems, confusion and dysphoric mood. The patient is not nervous/anxious.  Vitals:    08/29/16 1011   BP: 122/64   Weight: 136.1 kg (300 lb) Height: 180.3 cm (71)     Body mass index is 41.84 kg/m???.     Physical Exam   Constitutional: He is oriented to person, place, and time. He appears well-developed and well-nourished.   HENT:   Head: Normocephalic and atraumatic.   Neurological: He is alert and oriented to person, place, and time.   Skin:   Left calf wound which is superficial through the dermis but not in involving the subcutaneous tissues and muscle.  Measures 35 x 20 mm.  Spleen the margins are well-defined.  There is no need for debridement.           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  2.  Status post bilateral small saphenous ablations with microphlebectomy  3.  CEA P1 6  4.  VC ESS 13    Plan: #1 continue Xeroform gauze to the wound with coverage of dry 4 x 4 and Ace wrap compression  2.  Follow-up in 2 weeks    1. 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

## 2016-08-31 ENCOUNTER — Encounter: Admit: 2016-08-31 | Discharge: 2016-08-31 | Payer: MEDICARE

## 2016-08-31 DIAGNOSIS — J441 Chronic obstructive pulmonary disease with (acute) exacerbation: Principal | ICD-10-CM

## 2016-08-31 MED ORDER — PREDNISONE 10 MG PO TAB
40 mg | ORAL_TABLET | Freq: Every day | ORAL | 0 refills | Status: AC
Start: 2016-08-31 — End: ?

## 2016-08-31 MED ORDER — LEVOFLOXACIN 750 MG PO TAB
750 mg | ORAL_TABLET | Freq: Every day | ORAL | 0 refills | 7.00000 days | Status: AC
Start: 2016-08-31 — End: ?

## 2016-08-31 NOTE — Telephone Encounter
They still have not received their wound care supplies

## 2016-08-31 NOTE — Telephone Encounter
RN called patient's wife. She voiced frustration at lack of communication from our office. RN advised that patient/ SO call RN directly with any unresolved issues; she stated well that's what I'm doing now!. RN referenced letter in chart stating that the office had attempted to contact the patient to schedule a follow-up appointment, but were unable to reach patient. SO denies receiving any phone calls or letters in the mail. Patient had appointment with Dr. [name] scheduled for 06/23/16, which SO requested to cancel on 06/19/16. At that time, RN instructed SO to call back ASAP to re-schedule.     RN sent messages to office on 06/19/16, and 07/19/16, to contact patient to reschedule.       RN spoke to patient about current respiratory symptoms. Patient reports continued unstable fluid balance, and fluctuating LE edema, even with new diuretic dose per Cardiology; as well as increased weight today.     Yesterday, patient developed tight cough; reports feeling of chest congestion/ consolidation, however cough NP. RN noted audible tight-sounding, hacking cough over phone.     Last evening, pt developed audible wheezing and SOB at rest. He has resumed use of supplemental oxygen at 4 LPM continuous. SO  He used Duoneb at hs, without improvement.     Patient denies fevers, chills, or night sweats.     SO reports that patient historically develops similar symptoms at this time of year, and requires hospitalization.     Patient reports using Symbicort 2 puffs BID; Duoneb 1-2 x daily; albuterol HFA q 6 hours, Singulair.     Patient denies currently taking OTC Zyrtec. Has stopped Zantac, as we are unable to fill until f/u appointment scheduled per policy. Last seen 05/2015.     RN encouraged pt to increase use of Duoneb to Q6 hours, and albuterol HFA to q 3-4 hours during periods of acute symptoms.     Patient is requesting assessment for oxygen conserving device. RN will contact [facility] to discuss, and send orders if this option is appropriate.     Patient [facility] [place]     RN will discuss symptoms with Dr. [name], and call patient with recommendations. We will also establish time/ date for F/U appointment, and send refills until that time. Patient agreeable to this plan, and voiced appreciation.

## 2016-08-31 NOTE — Telephone Encounter
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  She reports it shipped on 08/29/2016 and is set to be delivered today by 8:00pm.      I called Mrs. Stadelman and let her know the supplies should be delivered sometime today.  She voiced understanding.  If they do not receive supplies today she will call tomorrow morning.

## 2016-09-01 ENCOUNTER — Encounter: Admit: 2016-09-01 | Discharge: 2016-09-01 | Payer: MEDICARE

## 2016-09-01 ENCOUNTER — Ambulatory Visit: Admit: 2016-09-01 | Discharge: 2016-09-01 | Payer: MEDICARE

## 2016-09-01 ENCOUNTER — Ambulatory Visit: Admit: 2016-09-01 | Discharge: 2016-09-02 | Payer: MEDICARE

## 2016-09-01 DIAGNOSIS — I429 Cardiomyopathy, unspecified: ICD-10-CM

## 2016-09-01 DIAGNOSIS — I714 Abdominal aortic aneurysm, without rupture: ICD-10-CM

## 2016-09-01 DIAGNOSIS — R0600 Dyspnea, unspecified: ICD-10-CM

## 2016-09-01 DIAGNOSIS — I10 Essential (primary) hypertension: Principal | ICD-10-CM

## 2016-09-01 DIAGNOSIS — E785 Hyperlipidemia, unspecified: ICD-10-CM

## 2016-09-01 DIAGNOSIS — M5440 Lumbago with sciatica, unspecified side: ICD-10-CM

## 2016-09-01 DIAGNOSIS — E782 Mixed hyperlipidemia: ICD-10-CM

## 2016-09-01 DIAGNOSIS — Z8614 Personal history of Methicillin resistant Staphylococcus aureus infection: ICD-10-CM

## 2016-09-01 DIAGNOSIS — J441 Chronic obstructive pulmonary disease with (acute) exacerbation: Principal | ICD-10-CM

## 2016-09-01 DIAGNOSIS — G4733 Obstructive sleep apnea (adult) (pediatric): ICD-10-CM

## 2016-09-01 DIAGNOSIS — J449 Chronic obstructive pulmonary disease, unspecified: ICD-10-CM

## 2016-09-01 DIAGNOSIS — T148XXA Other injury of unspecified body region, initial encounter: ICD-10-CM

## 2016-09-01 DIAGNOSIS — Z9981 Dependence on supplemental oxygen: ICD-10-CM

## 2016-09-01 DIAGNOSIS — J302 Other seasonal allergic rhinitis: ICD-10-CM

## 2016-09-01 DIAGNOSIS — Z8679 Personal history of other diseases of the circulatory system: ICD-10-CM

## 2016-09-01 DIAGNOSIS — F321 Major depressive disorder, single episode, moderate: ICD-10-CM

## 2016-09-01 DIAGNOSIS — J189 Pneumonia, unspecified organism: ICD-10-CM

## 2016-09-01 DIAGNOSIS — R05 Cough: ICD-10-CM

## 2016-09-01 DIAGNOSIS — I872 Venous insufficiency (chronic) (peripheral): ICD-10-CM

## 2016-09-01 DIAGNOSIS — Z79899 Other long term (current) drug therapy: ICD-10-CM

## 2016-09-01 DIAGNOSIS — J479 Bronchiectasis, uncomplicated: ICD-10-CM

## 2016-09-01 DIAGNOSIS — L03116 Cellulitis of left lower limb: ICD-10-CM

## 2016-09-01 DIAGNOSIS — I5042 Chronic combined systolic (congestive) and diastolic (congestive) heart failure: ICD-10-CM

## 2016-09-01 DIAGNOSIS — I2510 Atherosclerotic heart disease of native coronary artery without angina pectoris: ICD-10-CM

## 2016-09-01 DIAGNOSIS — R609 Edema, unspecified: ICD-10-CM

## 2016-09-01 DIAGNOSIS — K219 Gastro-esophageal reflux disease without esophagitis: ICD-10-CM

## 2016-09-01 DIAGNOSIS — M954 Acquired deformity of chest and rib: ICD-10-CM

## 2016-09-01 DIAGNOSIS — G4730 Sleep apnea, unspecified: ICD-10-CM

## 2016-09-01 DIAGNOSIS — I509 Heart failure, unspecified: ICD-10-CM

## 2016-09-01 DIAGNOSIS — J45909 Unspecified asthma, uncomplicated: ICD-10-CM

## 2016-09-01 DIAGNOSIS — M4726 Other spondylosis with radiculopathy, lumbar region: ICD-10-CM

## 2016-09-01 DIAGNOSIS — Z95828 Presence of other vascular implants and grafts: ICD-10-CM

## 2016-09-01 DIAGNOSIS — F4323 Adjustment disorder with mixed anxiety and depressed mood: Principal | ICD-10-CM

## 2016-09-01 DIAGNOSIS — N183 Chronic kidney disease, stage 3 (moderate): ICD-10-CM

## 2016-09-01 DIAGNOSIS — E669 Obesity, unspecified: ICD-10-CM

## 2016-09-01 MED ORDER — ALBUTEROL SULFATE 2.5 MG/0.5 ML IN NEBU
2.5 mg | RESPIRATORY_TRACT | 3 refills | Status: SS | PRN
Start: 2016-09-01 — End: 2016-11-10

## 2016-09-01 MED ORDER — CODEINE-GUAIFENESIN 10-100 MG/5 ML PO LIQD
5 mL | ORAL | 0 refills | Status: AC | PRN
Start: 2016-09-01 — End: 2017-02-06

## 2016-09-01 MED ORDER — DULOXETINE 30 MG PO CPDR
30 mg | ORAL_CAPSULE | Freq: Every day | ORAL | 3 refills | 60.00000 days | Status: AC
Start: 2016-09-01 — End: 2016-10-06

## 2016-09-01 MED ORDER — NEBULIZER  COMPRESSOR MISC DEVICE
0 refills | 1.00000 days | Status: AC
Start: 2016-09-01 — End: 2017-11-14

## 2016-09-01 MED ORDER — BENZONATATE 100 MG PO CAP
100 mg | ORAL_CAPSULE | Freq: Three times a day (TID) | ORAL | 0 refills | 9.00000 days | Status: AC | PRN
Start: 2016-09-01 — End: 2016-10-03
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
## 2016-09-01 NOTE — Telephone Encounter
Received call from Jerrod at [facility] stating that the patient's chart notes need to state the need for a new nebulizer and the note needs to state "The patient needs a nebulizer for nebulized medication" and this needs to be faxed to [facility] at [phone]. Routing to Dr. Crecencio Mc. Emilia Beck, RN

## 2016-09-01 NOTE — Telephone Encounter
Received call from [facility] requesting chart notes on this patient in regards to the need for the nebulizer. Printed and faxed to [facility] at [phone]. Emilia Beck, RN

## 2016-09-01 NOTE — Telephone Encounter
I updated note, please fax note to [facility]

## 2016-09-04 NOTE — Telephone Encounter
Updated office visit notes printed and faxed to [facility] at [phone]. Emilia Beck, RN

## 2016-09-12 ENCOUNTER — Ambulatory Visit: Admit: 2016-09-12 | Discharge: 2016-09-13 | Payer: MEDICARE

## 2016-09-12 ENCOUNTER — Encounter: Admit: 2016-09-12 | Discharge: 2016-09-12 | Payer: MEDICARE

## 2016-09-12 ENCOUNTER — Ambulatory Visit: Admit: 2016-09-12 | Discharge: 2016-09-12 | Payer: MEDICARE

## 2016-09-12 DIAGNOSIS — Z8614 Personal history of Methicillin resistant Staphylococcus aureus infection: ICD-10-CM

## 2016-09-12 DIAGNOSIS — L03116 Cellulitis of left lower limb: ICD-10-CM

## 2016-09-12 DIAGNOSIS — R05 Cough: ICD-10-CM

## 2016-09-12 DIAGNOSIS — Z9981 Dependence on supplemental oxygen: ICD-10-CM

## 2016-09-12 DIAGNOSIS — J189 Pneumonia, unspecified organism: ICD-10-CM

## 2016-09-12 DIAGNOSIS — R609 Edema, unspecified: ICD-10-CM

## 2016-09-12 DIAGNOSIS — N183 Chronic kidney disease, stage 3 (moderate): ICD-10-CM

## 2016-09-12 DIAGNOSIS — M954 Acquired deformity of chest and rib: ICD-10-CM

## 2016-09-12 DIAGNOSIS — Z95828 Presence of other vascular implants and grafts: ICD-10-CM

## 2016-09-12 DIAGNOSIS — E669 Obesity, unspecified: ICD-10-CM

## 2016-09-12 DIAGNOSIS — I429 Cardiomyopathy, unspecified: ICD-10-CM

## 2016-09-12 DIAGNOSIS — Z8679 Personal history of other diseases of the circulatory system: ICD-10-CM

## 2016-09-12 DIAGNOSIS — R0600 Dyspnea, unspecified: ICD-10-CM

## 2016-09-12 DIAGNOSIS — E782 Mixed hyperlipidemia: ICD-10-CM

## 2016-09-12 DIAGNOSIS — I10 Essential (primary) hypertension: Principal | ICD-10-CM

## 2016-09-12 DIAGNOSIS — I714 Abdominal aortic aneurysm, without rupture: ICD-10-CM

## 2016-09-12 DIAGNOSIS — I2510 Atherosclerotic heart disease of native coronary artery without angina pectoris: Secondary | ICD-10-CM

## 2016-09-12 DIAGNOSIS — J449 Chronic obstructive pulmonary disease, unspecified: ICD-10-CM

## 2016-09-12 DIAGNOSIS — I83022 Varicose veins of left lower extremity with ulcer of calf: Principal | ICD-10-CM

## 2016-09-12 DIAGNOSIS — G4733 Obstructive sleep apnea (adult) (pediatric): ICD-10-CM

## 2016-09-12 DIAGNOSIS — J302 Other seasonal allergic rhinitis: ICD-10-CM

## 2016-09-12 DIAGNOSIS — K219 Gastro-esophageal reflux disease without esophagitis: ICD-10-CM

## 2016-09-12 DIAGNOSIS — G4730 Sleep apnea, unspecified: ICD-10-CM

## 2016-09-12 DIAGNOSIS — E785 Hyperlipidemia, unspecified: ICD-10-CM

## 2016-09-12 DIAGNOSIS — J479 Bronchiectasis, uncomplicated: ICD-10-CM

## 2016-09-12 DIAGNOSIS — J45909 Unspecified asthma, uncomplicated: ICD-10-CM

## 2016-09-12 DIAGNOSIS — I5042 Chronic combined systolic (congestive) and diastolic (congestive) heart failure: ICD-10-CM

## 2016-09-12 DIAGNOSIS — I509 Heart failure, unspecified: ICD-10-CM

## 2016-09-12 DIAGNOSIS — R600 Localized edema: ICD-10-CM

## 2016-09-12 DIAGNOSIS — I83029 Varicose veins of left lower extremity with ulcer of unspecified site: ICD-10-CM

## 2016-09-12 LAB — BASIC METABOLIC PANEL
Lab: 1.5 mg/dL — ABNORMAL HIGH (ref 0.4–1.24)
Lab: 136 MMOL/L — ABNORMAL LOW (ref 137–147)
Lab: 157 mg/dL — ABNORMAL HIGH (ref 70–100)
Lab: 29 MMOL/L (ref 21–30)
Lab: 4.7 MMOL/L (ref 3.5–5.1)
Lab: 40 mg/dL — ABNORMAL HIGH (ref 7–25)
Lab: 45 mL/min — ABNORMAL LOW (ref >60)
Lab: 55 mL/min — ABNORMAL LOW (ref >60)
Lab: 9 (ref 3–12)
Lab: 9.6 mg/dL (ref 8.5–10.6)
Lab: 98 MMOL/L (ref 98–110)

## 2016-09-12 NOTE — Progress Notes
Date of Service: 09/12/2016    Ruben Wood. is a 76 y.o. male.       HPI       Patient is here for follow-up of chronic systolic heart failure that she recovered to about 40%.  Also follows with Dr. Jacqulyn Wood is his primary cardiologist.    Ruben Wood is seen today in a 2???week follow up visit. 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 He had also previously been following with Dr. Vita Wood in the weight reduction clinic but since not following the diet has regained much of the weight.   ???  He was seen by Dr. Jacqulyn Wood on 08/08/2016 when his weight continued to increase in his Bumex was increased to 4 mg twice daily. He was then seen in follow-up on 6/28 in the heart failure clinic when his weight was down to 292.6 pounds For this he previously 306 pounds on 6/19).  His creatinine had increased to 2.05 from a previous reading of 1.38 and his BUN was 64, previously 33.  Sodium was also down to 134 from previous of 137.  His Bumex was reduced to 3 mg twice daily however he wanted to continue at 4 mg twice daily.     The patient notes significant shortness of breath with even low levels of activity, consitent with NYHA Class III, including dyspnea with activities of daily living and has been avoiding stairs due to provoking symptoms of dyspnea.    The patient denies orthopnea, PND, lower extremity swelling, abdominal distention, change in weight, appetite, and also denies chest pain, chest pressure, presyncope, syncope, or any other new or progressive cardiac related symptoms. His weight is maintained between 290 and 295 pounds.  His creatinine is leveled off around 1.5.         Vitals:    09/12/16 1246   BP: 116/60   Pulse: 90   SpO2: 95%   Weight: 133.4 kg (294 lb)   Height: 1.829 m (6')     Body mass index is 39.87 kg/m???.     Past Medical History  Patient Active Problem List    Diagnosis Date Noted   ??? 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 Lower extremity edema 07/13/2016   ??? 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 He has deficient pneumococcal serotypes. He has normal tetanus toxoid Ab and CH50. He received Pneumovax in clinic today.    - We recommend obtaining repeat pneumococcal serotypes in 4-6 weeks. We will obtain Diptheria Ab at that time and T&B cell panel.   - We will repeat IgG every 6 months.       ??? Moderate persistent asthma with acute exacerbation 02/25/2016     He has had asthma since sometime in his 20-30s. He gets cough, chest tightness, wheezing, shortness of breath that is year round with worsening in the Spring and Fall. Triggers: URI, hot/cold air. He has a history of colds dropping to the chest. He is currently on Symbicort 160/4.39mcg 2 puff BID  but he has lost his spacer. He has had ~5 exacerbations in 2017 and 1 steroid course this year already. Additionally he has been diagnosed with bronchiectasis and emphysema; his PFTs suggest both obstructive and restrictive disease and asthma is unlikely to be the sole cause of his dyspnea.    He has negative IgE immunocaps to aeroallergens which makes the possibility of allergic asthma highly unlikely. His IgE level was 17 and he had negative ANCAs, MPO and PR3.     He has taken 2 days of steroids (prednisone 40mg ), however expired medication. He reports this has improved his shortness of breath.     - We recommend completing 5 day course of prednisone and will prescribe 3 additional days of prednisone 40mg . - We recommend adhering to Symbicort 160/4.83mcg 2 puff BID with Spacer. He is currently non-compliant with these medications.   - We recommend re-scheduling appointment with Dr. Cedric Wood for continued management.   - We will obtain records from Dr. Jasmine Wood office (Pulmonologist in [place], [place]) on whether the patient truly received Xolair.      ??? 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 He is not having issues with urticaria.    - We recommend IgE immunocaps to aeroallergens.      ??? Chronic Rhinoconjunctivitis 02/25/2016     He has had nasal congestion, rhinorrhea, PND, sneezing, itchy/watery eyes since he was a child that is year round and not worsening in seasons. Nasal sprays are not helpful (Flonase, Nasacort) and patient had incorrect technique with sprays with resultant nosebleeds. He takes Zyrtec 10mg  daily and Singulair 10mg  daily. He has had prior IT with Dr. Thayer Wood in [place], in [place], and with ENT physician Dr. Ander Wood in [place], [place]. He only found first round of IT helpful. He has never had sinus imaging or endoscopy. He had a normal CT sinuses 02/2016 and negative IgE immunocaps for aeroallergens 02/2016.     His symptoms are currently uncontrolled. He found many corticosteroids unhelpful including Flonase, Rhinocort, Nasacort. He is currently off of Zyrtec.     - We recommend starting Qnasl 2 sprays in each nostril once daily. We reviewed technique in clinic.   - We recommend re-starting zyrtec 10mg  daily and continuing singulair 10mg  daily. His most recent eGFR is 69. If this changes in the future, his anti-histamine dose may need to be adjusted.  - We recommend stopping Atrovent given the possibility of early glaucoma.      ??? 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 Etiologies include likely atopic disease, but other considerations include vasculitidies such as EGPA. Less likely parasitic infection as no bothersome GI symptoms or serpiginous rashes.    - We recommend repeating CBC w/ diff.   - We will obtain ANCAs     ??? Recurrent infections 02/25/2016     He has had 5-10 rounds of antibiotics per year for sinus infections and bronchitis. He was previously having recurrent pneumonias (3-4 times a year for 5 years) however not as of recently. Additionally he has had cellulitis in LE requiring hospitalization and IV Abx. Although these infections could be explained by underlying disease processes (obstructive/restrictive lung diseases and venous stasis requiring vein stripping), he meets the warning signs from [facility] 10 signs of PIDD.    He had mildly low IgG at 636 with normal IgA and IgM. He had positive tetanus toxoid Ab and normal CH50. He had deficient pneumococcal serotypes.  He received Pneumovax in clinic today. He received Prevnar in 2016.     - We recommend obtaining repeat pneumococcal serotypes in 4-6 weeks. We will obtain Diptheria Ab at that time and T&B cell panel.   - We will repeat IgG every 6 months.       ??? Postoperative visit 02/23/2016   ??? S/P left pulmonary artery pressure sensor implant placement (CardioMEMs)  02/02/2016     * Patient has CardioMEMs (Pulmonary Artery Pressure Sensor)* Please call Matthew Folks, CardioMEMs Program Coordinator [phone] or page Heart Failure Rounding Team if patient is admitted or presents to ED*      ??? Ischemic cardiomyopathy 01/12/2016   ??? 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 Wound of skin 01/12/2016   ??? 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 Varicose veins of left lower extremity with pain 01/05/2016   ??? 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 Obesity, morbid (HCC) 11/25/2015   ??? Varicose veins of leg with pain, left 11/25/2015   ??? Pain of left lower extremity 11/25/2015   ??? Hypercholesterolemia 11/25/2015 ??? Obesity (BMI 35.0-39.9 without comorbidity) 11/25/2015   ??? 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 Bronchiectasis without complication (HCC) 06/18/2015     -flutter  -non sputum producer     ??? Chronic obstructive pulmonary disease (HCC) 03/10/2015   ??? Abnormal cortisol level 12/25/2014   ??? Venous stasis dermatitis of both lower extremities 11/04/2014   ??? Sacroiliac dysfunction 07/28/2014   ??? 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 Chronic combined systolic and diastolic congestive heart failure, NYHA class 2 (HCC) 07/19/2014     11/04/14: EF 20%, severely dilated LV with grade 1 diastolic dysfunction.  11/12/2014 In clinic today, patient appears fairly well compensated.  He does have some LEE, but no significant rales (just some at bases), no JVD sitting upright, and no sx to suggest decompensation.  We will continue metoprolol, spironolactone, and bumex at current doses.    11/17/2014 Edema much improved in lower ext, Cr trending down on last labs.  Will recheck labs today.  Advised to weigh himself daily.  Rechecking BMP today to ensure not overdoing diuretics.     On metoprolol, spironolactone.  Will need to explore allergy to ARB more as patient may benefit from ACEI.      03/29/14 CardioMEMS implant by Dr. Chales Abrahams  1. Normal cardiac output and cardiac index.  2. Normal pulmonary pressures and normal pulmonary capillary wedge pressure.  3. Successful insertion of CardioMEMS pulmonary artery pressure sensor     ??? Cardiomyopathy (HCC) 07/19/2014       CardioMEMS Primary Screening                                  Date: 02/23/2015 Heart failure admission within last 12 months                  Date: 11/03/14 Yes        NYHA HF Class III Yes  Able to take dual antiplatelet or anticoagulants for 1 month post implant Yes   Additional Screening if all above are yes        Patient with active infection No        History of recurrent PE or DVT No        Unable to tolerate RHC No        GFR < 25 mL/min and non-responsive to diuretics or on chronic renal dialysis No        Congenital heart disease or mechanical right heart valve No        CRT device implanted within last 3 months?          Date of Implant:  No        BMI > 35, axillary chest circumference > 165 cm                       140 cm  No        Unwilling to transmit device date or concerns with patient compliance No        ??? 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      ??? CKD (chronic kidney disease) stage 3, GFR 30-59 ml/min (HCC) 08/26/2013   ??? Mixed dyslipidemia 08/26/2013     Hepatic Function    Lab Results   Component Value Date/Time    ALBUMIN 4.0 11/04/2014 12:26 AM    TOTAL PROTEIN 6.9 11/04/2014 12:26 AM    ALK PHOSPHATASE 61 11/04/2014 12:26 AM    Lab Results   Component Value Date/Time    AST (SGOT) 41* 11/04/2014 12:26 AM    ALT (SGPT) 19 11/04/2014 12:26 AM    TOTAL BILIRUBIN 1.4* 11/04/2014 12:26 AM        Lab Results   Component Value Date    CHOL 148 12/15/2013    TRIG 122 12/15/2013    HDL 51 12/15/2013    LDL 88 12/15/2013    VLDL 24 12/15/2013    NONHDLCHOL 97 12/15/2013       BP Readings from Last 3 Encounters:   11/12/14 129/80   11/09/14 111/44 11/04/14 146/65     Plan:   76 y.o. with known CAD with CTO of RCA and obtuse marginal branch with high grade stenosis (90%) in mid left circ.    Advised heart healthy diet and daily exercise.    Continue high intensity statin, atorvastatin       ??? AAA (abdominal aortic aneurysm) (HCC) 10/15/2012     Duplex done at OSH in 2007 and 2008 ~ 4.1 cm    Duplex 10/15/12-AAA 7.3 cm AP x 7.3 cm       ??? History of MRSA infection 10/14/2012   ??? Chronic cough 08/29/2012   ??? CAD (coronary artery disease) 08/15/2012     07/11/2002- Cath @ [facility] showed Severe double vessel disease(OMB of circumflex and distal circ)  inferior-basilar dyskinesis.                  Elevated LVEDP, minimal pulmonary hypertension, all similar to cath  done 01/1994 with essentially no change( cath results scanned into chart)    Chronic total occlusion of left circumflex coronary artery with collateral filling.    09/12/12   Cath - [facility]:  CTO of the right coronary artery, CTO of the obtuse marginal branch and high-grade stenosis of   90% in the mid left circumflex artery No significant stenosis in the left anterior descending artery or left main vessel     Failed attempt in opening 2nd obtuse marginal CTO as described above.    10/14/12: Unsuccessful attempt to open CTO of OMB and mid-CFX by Dr. Mackey Birchwood         ??? OSA on CPAP 08/13/2012     CPAP at  DME: [facility]     ??? GERD (gastroesophageal reflux disease) 08/13/2012     -PPI  -follows with GI     ??? 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  Discussed the importance of diet.  Exercise as tolerated.        ??? Essential hypertension 08/13/2012   ??? Chest wall deformity 07/09/2012     Chest wall reconstruction on 07/09/12  Lung herniation- bio bridge Review of Systems   Constitution: Negative.   HENT: Negative.    Eyes: Negative.    Cardiovascular: Negative.    Respiratory: Negative.    Endocrine: Negative.    Hematologic/Lymphatic: Negative.    Skin: Negative.    Musculoskeletal: Negative.    Gastrointestinal: Negative.    Genitourinary: Negative.    Neurological: Negative.    Psychiatric/Behavioral: Negative.    Allergic/Immunologic: Negative.    All other systems reviewed and are negative.      Physical Exam    General Appearance: in no distress   Skin: warm  Digits: no cyanosis  Eyes: sclera anicteric   Lips & Oral Mucosa: no cyanosis   Neck Veins: No significant jugular venous distention  Respiratory Effort: no respiratory distress   Auscultation/Percussion: no rales or rhonchi, no wheezing   PMI: displaced laterally   Cardiac Rhythm:   Cardiac Auscultation: S1, S2 soft  Murmurs:   Radial Arteries: soft symmetric radial pulses   Pedal Pulses: diminished symmetric pedal pulses   Lower Extremity Edema: trace   Abdominal Exam: soft, non-tender  Liver:   Neuromuscular: normal muscle tone   Orientation: oriented to time, place and person   Affect & Mood: appropriate   Language and Memory: patient responsive and seems to comprehend information   Neurologic Exam: neurological assessment grossly intact     Cardiovascular Studies      Problems Addressed Today  Encounter Diagnoses   Name Primary?   ??? Chronic combined systolic and diastolic congestive heart failure, NYHA class 2 (HCC) Yes   ??? CKD (chronic kidney disease) stage 3, GFR 30-59 ml/min (HCC)        Assessment and Plan     We discussed together the targeted goals for his diuretics and I think that right now as he looks today he has no evidence of volume overload and his kidney function appears to be stable also with a stable sodium.  His ejection fraction is increased to 40% with guideline based therapies including CRT-D.  Of course would like to  get him on ACE or arm or additional medication in this class but he is having very fluctuant kidney function as well as orthostatic symptoms recently.  We need to optimize his volume and establish a good PA diastolic goal given that he has remote PA pressure monitoring.    Discussed with our nurse coordinator as well as with the patient that we will target a goal of approximately 5-10 mmHg of the PA diastolic range and try to level things off or they are with the dry weight of approximately 290-295 pounds.  No additional changes to his medications today.  Should he require hospitalizations or should he have decrement in his ejection fraction he may be a candidate for investigational therapies but as of today I would not make any changes and he should continue to follow with his primary cardiologist.    INSTRUCTIONS PROVIDED TO PATIENT IN WRITING:     We are going to target a CardioMEMS goal to keep you maintained where your PA-diastolic numbers have been recently. Suspect this would be 5-26mmHg.     Verlon Au and I discussed she will keep this in mind moving forward.     See Dr. Jacqulyn Wood as you typically would in 3-6 months.     I will get involved as he and/or Verlon Au need me to and you can see HF NPs anytime either of [place] are not available.     Call with questions.     Total time 25 minutes. Estimated counseling time 15 minutes including risks/benefits/alternatives discussion related to plan as outlined and answering all questions related to the care plan while educating on the importance of adherence to recommended therapies, outpatient follow-up, and also addressing prognosis specific to the patient/family concerns.    Thank you for allowing [place] to participate in the shared care of your patient.    Lamont Snowball, MD  Advanced Heart Failure & Transplant Cardiologist    Associate Professor of Medicine  Director  Division of Advanced Heart Failure & Cardiac Transplantation  [facility] Primary Transplant Physician Medical Director, Heart Transplantation  [facility] for Transplantation  [facility]  Phone: [phone]  Fax: [phone]  Mobile: [phone]  [email or web address]         Current Medications (including today's revisions)  ??? acetaminophen (TYLENOL) 500 mg tablet Take 2 tablets by mouth three times daily. Max of 4,000 mg of acetaminophen in 24 hours.   ??? albuterol (PROAIR HFA, VENTOLIN HFA, OR PROVENTIL HFA) 90 mcg/actuation inhaler Inhale 2 puffs by mouth into the lungs every 4 hours as needed for Wheezing or Shortness of Breath.   ??? albuterol 0.5% (PROVENTIL; VENTOLIN) 2.5 mg/0.5 mL nebulizer solution Inhale 0.5 mL solution by nebulizer as directed every 6 hours as needed for Shortness of Breath or Wheezing.   ??? albuterol-ipratropium (DUONEB) 0.5 mg-3 mg(2.5 mg base)/3 mL nebulizer solution Inhale 3 mL solution by nebulizer as directed every 6 hours as needed for Wheezing.   ??? allopurinol (ZYLOPRIM) 300 mg tablet Take 1 tablet by mouth daily.   ??? AMITRIPTYLINE HCL (AMITRIPTYLINE/GABAPENTIN/EMU OIL(#)) 05/24/08 % Apply 5 g topically to affected area three times daily.   ??? aspirin EC 81 mg tablet Take 81 mg by mouth daily.   ??? atorvastatin (LIPITOR) 20 mg tablet TAKE ONE TABLET BY MOUTH ONCE DAILY   ??? benzonatate (TESSALON PERLES) 100 mg capsule Take 1 capsule by mouth three times daily as needed for Cough.   ??? budesonide/formoterol (SYMBICORT HFA) 160/4.5 mcg inhalation Inhale 2  puffs by mouth into the lungs twice daily. Rinse mouth after use.   ??? bumetanide (BUMEX) 2 mg tablet Take 2 tablets by mouth twice daily.   ??? cephalexin (KEFLEX) 500 mg capsule Take 500 mg by mouth three times daily.   ??? cetirizine (ZYRTEC) 10 mg tablet Take 10 mg by mouth every morning.   ??? cholecalciferol (VITAMIN D-3) 1,000 units tablet Take 1,000 Units by mouth daily.   ??? codeine/guaiFENesin (ROBITUSSIN-AC) 10/100 mg/5 mL oral solution Take 5 mL by mouth every 4 hours as needed for Cough. ??? duloxetine DR (CYMBALTA) 30 mg capsule Take 1 capsule by mouth daily.   ??? Flunisolide 25 mcg (0.025 %) spry Apply 2 sprays to each nostril as directed twice daily.   ??? gabapentin (NEURONTIN) 400 mg capsule Take 400 mg by mouth every 8 hours.   ??? GUAIFENESIN (MUCINEX PO) Take 1 tablet by mouth as Needed.   ??? ipratropium bromide (ATROVENT) 42 mcg (0.06 %) nasal spray 1 spray to each nostril Q6H PRN   ??? L.ACID/L.CASEI/B.BIF/B.LON/FOS (PROBIOTIC BLEND PO) Take 1 Cap by mouth.   ??? lidocaine (LIDODERM) 5 % topical patch Apply 1 patch topically to affected area every 24 hours. Apply patch for 12 hours, then remove for 12 hours before repeating.   ??? lutein-zeaxanthin 25-5 mg cap Take  by mouth.   ??? metoprolol XL (TOPROL XL) 25 mg extended release tablet Take 1 tablet by mouth daily.   ??? montelukast (SINGULAIR) 10 mg tablet TAKE ONE TABLET BY MOUTH AT BEDTIME. MAINTENANCE THERAPY FOR ASTHMA.   ??? mupirocin (BACTROBAN) 2 % topical ointment    ??? nebulizer compressor medical device Use as directed.   ??? nitroglycerin (NITROSTAT) 0.4 mg tablet Place 1 Tab under tongue every 5 minutes as needed for Chest Pain.   ??? oxyCODONE (ROXICODONE, OXY-IR) 5 mg tablet Take 1 tablet by mouth every 12 hours as needed for Pain (Patient taking differently: Take 5 mg by mouth three times daily )   ??? pantoprazole DR (PROTONIX) 40 mg tablet Take 1 Tab by mouth twice daily.   ??? potassium chloride SR (K-DUR) 20 mEq tablet Take 1 tablet by mouth daily. Take with a meal and a full glass of water.   ??? ranitidine(+) (ZANTAC) 150 mg tablet TAKE ONE TABLET BY MOUTH ONCE DAILY   ??? senna/docusate (SENOKOT-S) 8.6/50 mg tablet Take 1 Tab by mouth twice daily.   ??? sildenafil(+) (VIAGRA) 50 mg tablet Take 1 tablet by mouth as Needed for Erectile dysfunction.   ??? spironolactone (ALDACTONE) 25 mg tablet Take 1 tablet by mouth twice daily.   ??? tiZANidine (ZANAFLEX) 4 mg tablet Take 1 tablet by mouth every 6 hours as needed.

## 2016-09-14 ENCOUNTER — Encounter: Admit: 2016-09-14 | Discharge: 2016-09-14 | Payer: MEDICARE

## 2016-09-14 ENCOUNTER — Ambulatory Visit: Admit: 2016-09-14 | Discharge: 2016-09-14 | Payer: MEDICARE

## 2016-09-14 DIAGNOSIS — I10 Essential (primary) hypertension: Secondary | ICD-10-CM

## 2016-09-14 DIAGNOSIS — J479 Bronchiectasis, uncomplicated: ICD-10-CM

## 2016-09-14 DIAGNOSIS — E669 Obesity, unspecified: ICD-10-CM

## 2016-09-14 DIAGNOSIS — E785 Hyperlipidemia, unspecified: ICD-10-CM

## 2016-09-14 DIAGNOSIS — I5042 Chronic combined systolic (congestive) and diastolic (congestive) heart failure: ICD-10-CM

## 2016-09-14 DIAGNOSIS — R05 Cough: ICD-10-CM

## 2016-09-14 DIAGNOSIS — I714 Abdominal aortic aneurysm, without rupture: ICD-10-CM

## 2016-09-14 DIAGNOSIS — M954 Acquired deformity of chest and rib: ICD-10-CM

## 2016-09-14 DIAGNOSIS — L03116 Cellulitis of left lower limb: ICD-10-CM

## 2016-09-14 DIAGNOSIS — D801 Nonfamilial hypogammaglobulinemia: Principal | ICD-10-CM

## 2016-09-14 DIAGNOSIS — N183 Chronic kidney disease, stage 3 (moderate): ICD-10-CM

## 2016-09-14 DIAGNOSIS — Z8679 Personal history of other diseases of the circulatory system: ICD-10-CM

## 2016-09-14 DIAGNOSIS — J45909 Unspecified asthma, uncomplicated: ICD-10-CM

## 2016-09-14 DIAGNOSIS — J449 Chronic obstructive pulmonary disease, unspecified: ICD-10-CM

## 2016-09-14 DIAGNOSIS — Z9981 Dependence on supplemental oxygen: ICD-10-CM

## 2016-09-14 DIAGNOSIS — G4730 Sleep apnea, unspecified: ICD-10-CM

## 2016-09-14 DIAGNOSIS — B999 Unspecified infectious disease: ICD-10-CM

## 2016-09-14 DIAGNOSIS — E782 Mixed hyperlipidemia: ICD-10-CM

## 2016-09-14 DIAGNOSIS — R0600 Dyspnea, unspecified: ICD-10-CM

## 2016-09-14 DIAGNOSIS — I429 Cardiomyopathy, unspecified: ICD-10-CM

## 2016-09-14 DIAGNOSIS — K219 Gastro-esophageal reflux disease without esophagitis: ICD-10-CM

## 2016-09-14 DIAGNOSIS — I509 Heart failure, unspecified: ICD-10-CM

## 2016-09-14 DIAGNOSIS — I2510 Atherosclerotic heart disease of native coronary artery without angina pectoris: ICD-10-CM

## 2016-09-14 DIAGNOSIS — R609 Edema, unspecified: ICD-10-CM

## 2016-09-14 DIAGNOSIS — Z8614 Personal history of Methicillin resistant Staphylococcus aureus infection: ICD-10-CM

## 2016-09-14 DIAGNOSIS — J189 Pneumonia, unspecified organism: ICD-10-CM

## 2016-09-14 DIAGNOSIS — G4733 Obstructive sleep apnea (adult) (pediatric): ICD-10-CM

## 2016-09-14 DIAGNOSIS — Z95828 Presence of other vascular implants and grafts: ICD-10-CM

## 2016-09-14 DIAGNOSIS — J302 Other seasonal allergic rhinitis: ICD-10-CM

## 2016-09-14 DIAGNOSIS — J310 Chronic rhinitis: ICD-10-CM

## 2016-09-14 LAB — IMMUNOGLOBULIN G (IGG): Lab: 688 mg/dL — ABNORMAL LOW (ref 762–1488)

## 2016-09-15 ENCOUNTER — Encounter: Admit: 2016-09-15 | Discharge: 2016-09-15 | Payer: MEDICARE

## 2016-09-15 LAB — LYMPHOCYTE SUBSET PANEL, BLOOD
Lab: 15 % (ref 10–40)
Lab: 19 % — ABNORMAL LOW (ref 4–25)
Lab: 73 % (ref 49–84)

## 2016-09-15 NOTE — Progress Notes
See telephone note from 09/15/16

## 2016-09-20 ENCOUNTER — Encounter: Admit: 2016-09-20 | Discharge: 2016-09-20 | Payer: MEDICARE

## 2016-09-20 ENCOUNTER — Ambulatory Visit: Admit: 2016-09-20 | Discharge: 2016-09-21 | Payer: MEDICARE

## 2016-09-20 DIAGNOSIS — I5042 Chronic combined systolic (congestive) and diastolic (congestive) heart failure: ICD-10-CM

## 2016-09-20 DIAGNOSIS — Z8679 Personal history of other diseases of the circulatory system: ICD-10-CM

## 2016-09-20 DIAGNOSIS — J479 Bronchiectasis, uncomplicated: ICD-10-CM

## 2016-09-20 DIAGNOSIS — I429 Cardiomyopathy, unspecified: ICD-10-CM

## 2016-09-20 DIAGNOSIS — J302 Other seasonal allergic rhinitis: ICD-10-CM

## 2016-09-20 DIAGNOSIS — K219 Gastro-esophageal reflux disease without esophagitis: ICD-10-CM

## 2016-09-20 DIAGNOSIS — G4730 Sleep apnea, unspecified: ICD-10-CM

## 2016-09-20 DIAGNOSIS — E669 Obesity, unspecified: ICD-10-CM

## 2016-09-20 DIAGNOSIS — R05 Cough: ICD-10-CM

## 2016-09-20 DIAGNOSIS — I509 Heart failure, unspecified: ICD-10-CM

## 2016-09-20 DIAGNOSIS — J189 Pneumonia, unspecified organism: ICD-10-CM

## 2016-09-20 DIAGNOSIS — Z8614 Personal history of Methicillin resistant Staphylococcus aureus infection: ICD-10-CM

## 2016-09-20 DIAGNOSIS — J45909 Unspecified asthma, uncomplicated: ICD-10-CM

## 2016-09-20 DIAGNOSIS — I10 Essential (primary) hypertension: Principal | ICD-10-CM

## 2016-09-20 DIAGNOSIS — E785 Hyperlipidemia, unspecified: ICD-10-CM

## 2016-09-20 DIAGNOSIS — I714 Abdominal aortic aneurysm, without rupture: ICD-10-CM

## 2016-09-20 DIAGNOSIS — G4733 Obstructive sleep apnea (adult) (pediatric): ICD-10-CM

## 2016-09-20 DIAGNOSIS — E782 Mixed hyperlipidemia: ICD-10-CM

## 2016-09-20 DIAGNOSIS — R609 Edema, unspecified: ICD-10-CM

## 2016-09-20 DIAGNOSIS — M954 Acquired deformity of chest and rib: ICD-10-CM

## 2016-09-20 DIAGNOSIS — J449 Chronic obstructive pulmonary disease, unspecified: ICD-10-CM

## 2016-09-20 DIAGNOSIS — I2510 Atherosclerotic heart disease of native coronary artery without angina pectoris: ICD-10-CM

## 2016-09-20 DIAGNOSIS — Z9981 Dependence on supplemental oxygen: ICD-10-CM

## 2016-09-20 DIAGNOSIS — L03116 Cellulitis of left lower limb: ICD-10-CM

## 2016-09-20 DIAGNOSIS — Z95828 Presence of other vascular implants and grafts: ICD-10-CM

## 2016-09-20 DIAGNOSIS — R0600 Dyspnea, unspecified: ICD-10-CM

## 2016-09-20 DIAGNOSIS — N183 Chronic kidney disease, stage 3 (moderate): ICD-10-CM

## 2016-09-20 MED ORDER — ACETAMINOPHEN-CODEINE 300-30 MG PO TAB
1 | ORAL_TABLET | ORAL | 0 refills | Status: SS | PRN
Start: 2016-09-20 — End: 2016-11-10

## 2016-09-21 DIAGNOSIS — M4807 Spinal stenosis, lumbosacral region: Principal | ICD-10-CM

## 2016-09-21 DIAGNOSIS — M4317 Spondylolisthesis, lumbosacral region: ICD-10-CM

## 2016-09-21 DIAGNOSIS — M4727 Other spondylosis with radiculopathy, lumbosacral region: ICD-10-CM

## 2016-09-21 DIAGNOSIS — M5416 Radiculopathy, lumbar region: ICD-10-CM

## 2016-09-26 ENCOUNTER — Ambulatory Visit: Admit: 2016-09-26 | Discharge: 2016-09-26 | Payer: MEDICARE

## 2016-09-26 DIAGNOSIS — M4727 Other spondylosis with radiculopathy, lumbosacral region: ICD-10-CM

## 2016-09-26 DIAGNOSIS — M5416 Radiculopathy, lumbar region: ICD-10-CM

## 2016-09-26 DIAGNOSIS — I5042 Chronic combined systolic (congestive) and diastolic (congestive) heart failure: ICD-10-CM

## 2016-09-26 DIAGNOSIS — M4317 Spondylolisthesis, lumbosacral region: ICD-10-CM

## 2016-09-26 DIAGNOSIS — M4807 Spinal stenosis, lumbosacral region: Principal | ICD-10-CM

## 2016-10-03 ENCOUNTER — Encounter: Admit: 2016-10-03 | Discharge: 2016-10-03 | Payer: MEDICARE

## 2016-10-03 ENCOUNTER — Ambulatory Visit: Admit: 2016-10-03 | Discharge: 2016-10-03 | Payer: MEDICARE

## 2016-10-03 DIAGNOSIS — R05 Cough: ICD-10-CM

## 2016-10-03 DIAGNOSIS — M954 Acquired deformity of chest and rib: ICD-10-CM

## 2016-10-03 DIAGNOSIS — I509 Heart failure, unspecified: ICD-10-CM

## 2016-10-03 DIAGNOSIS — E782 Mixed hyperlipidemia: ICD-10-CM

## 2016-10-03 DIAGNOSIS — I5042 Chronic combined systolic (congestive) and diastolic (congestive) heart failure: ICD-10-CM

## 2016-10-03 DIAGNOSIS — G4733 Obstructive sleep apnea (adult) (pediatric): ICD-10-CM

## 2016-10-03 DIAGNOSIS — J302 Other seasonal allergic rhinitis: ICD-10-CM

## 2016-10-03 DIAGNOSIS — K219 Gastro-esophageal reflux disease without esophagitis: ICD-10-CM

## 2016-10-03 DIAGNOSIS — Z9981 Dependence on supplemental oxygen: ICD-10-CM

## 2016-10-03 DIAGNOSIS — E785 Hyperlipidemia, unspecified: ICD-10-CM

## 2016-10-03 DIAGNOSIS — R609 Edema, unspecified: ICD-10-CM

## 2016-10-03 DIAGNOSIS — Z8614 Personal history of Methicillin resistant Staphylococcus aureus infection: ICD-10-CM

## 2016-10-03 DIAGNOSIS — I429 Cardiomyopathy, unspecified: ICD-10-CM

## 2016-10-03 DIAGNOSIS — J4541 Moderate persistent asthma with (acute) exacerbation: Principal | ICD-10-CM

## 2016-10-03 DIAGNOSIS — J45909 Unspecified asthma, uncomplicated: ICD-10-CM

## 2016-10-03 DIAGNOSIS — J189 Pneumonia, unspecified organism: ICD-10-CM

## 2016-10-03 DIAGNOSIS — N183 Chronic kidney disease, stage 3 (moderate): ICD-10-CM

## 2016-10-03 DIAGNOSIS — J479 Bronchiectasis, uncomplicated: ICD-10-CM

## 2016-10-03 DIAGNOSIS — R0600 Dyspnea, unspecified: ICD-10-CM

## 2016-10-03 DIAGNOSIS — J449 Chronic obstructive pulmonary disease, unspecified: ICD-10-CM

## 2016-10-03 DIAGNOSIS — L03116 Cellulitis of left lower limb: ICD-10-CM

## 2016-10-03 DIAGNOSIS — Z95828 Presence of other vascular implants and grafts: ICD-10-CM

## 2016-10-03 DIAGNOSIS — I10 Essential (primary) hypertension: Secondary | ICD-10-CM

## 2016-10-03 DIAGNOSIS — Z8679 Personal history of other diseases of the circulatory system: ICD-10-CM

## 2016-10-03 DIAGNOSIS — G4730 Sleep apnea, unspecified: ICD-10-CM

## 2016-10-03 DIAGNOSIS — J310 Chronic rhinitis: ICD-10-CM

## 2016-10-03 DIAGNOSIS — I2510 Atherosclerotic heart disease of native coronary artery without angina pectoris: Secondary | ICD-10-CM

## 2016-10-03 DIAGNOSIS — I714 Abdominal aortic aneurysm, without rupture: ICD-10-CM

## 2016-10-03 DIAGNOSIS — E669 Obesity, unspecified: ICD-10-CM

## 2016-10-03 MED ORDER — BENZONATATE 100 MG PO CAP
100 mg | ORAL_CAPSULE | Freq: Three times a day (TID) | ORAL | 0 refills | 9.00000 days | Status: AC | PRN
Start: 2016-10-03 — End: 2016-11-21

## 2016-10-03 MED ORDER — AZITHROMYCIN 500 MG PO TAB
500 mg | ORAL_TABLET | Freq: Every day | ORAL | 0 refills | Status: AC
Start: 2016-10-03 — End: 2016-10-03

## 2016-10-03 MED ORDER — BUDESONIDE-FORMOTEROL 160-4.5 MCG/ACTUATION IN HFAA
2 | Freq: Two times a day (BID) | RESPIRATORY_TRACT | 3 refills | 30.00000 days | Status: AC
Start: 2016-10-03 — End: 2016-11-29

## 2016-10-03 MED ORDER — AZITHROMYCIN 250 MG PO TAB
ORAL_TABLET | Freq: Every day | 0 refills | Status: SS
Start: 2016-10-03 — End: 2016-11-10

## 2016-10-03 MED ORDER — PANTOPRAZOLE 40 MG PO TBEC
40 mg | ORAL_TABLET | Freq: Two times a day (BID) | ORAL | 11 refills | 90.00000 days | Status: AC
Start: 2016-10-03 — End: 2017-10-26

## 2016-10-03 MED ORDER — PREDNISONE 20 MG PO TAB
40 mg | ORAL_TABLET | Freq: Every day | ORAL | 0 refills | Status: SS
Start: 2016-10-03 — End: 2016-11-10

## 2016-10-03 MED ORDER — PREDNISONE 20 MG PO TAB
20 mg | ORAL_TABLET | Freq: Every day | ORAL | 0 refills | Status: AC
Start: 2016-10-03 — End: 2016-10-03

## 2016-10-03 MED ORDER — ALBUTEROL SULFATE 90 MCG/ACTUATION IN HFAA
2 | RESPIRATORY_TRACT | 0 refills | Status: AC | PRN
Start: 2016-10-03 — End: 2018-03-05

## 2016-10-04 ENCOUNTER — Encounter: Admit: 2016-10-04 | Discharge: 2016-10-04 | Payer: MEDICARE

## 2016-10-04 DIAGNOSIS — E669 Obesity, unspecified: ICD-10-CM

## 2016-10-04 DIAGNOSIS — J302 Other seasonal allergic rhinitis: ICD-10-CM

## 2016-10-04 DIAGNOSIS — G4730 Sleep apnea, unspecified: ICD-10-CM

## 2016-10-04 DIAGNOSIS — R05 Cough: ICD-10-CM

## 2016-10-04 DIAGNOSIS — G4733 Obstructive sleep apnea (adult) (pediatric): ICD-10-CM

## 2016-10-04 DIAGNOSIS — J189 Pneumonia, unspecified organism: ICD-10-CM

## 2016-10-04 DIAGNOSIS — E785 Hyperlipidemia, unspecified: ICD-10-CM

## 2016-10-04 DIAGNOSIS — Z8679 Personal history of other diseases of the circulatory system: ICD-10-CM

## 2016-10-04 DIAGNOSIS — Z95828 Presence of other vascular implants and grafts: ICD-10-CM

## 2016-10-04 DIAGNOSIS — J45909 Unspecified asthma, uncomplicated: ICD-10-CM

## 2016-10-04 DIAGNOSIS — Z8614 Personal history of Methicillin resistant Staphylococcus aureus infection: ICD-10-CM

## 2016-10-04 DIAGNOSIS — N183 Chronic kidney disease, stage 3 (moderate): ICD-10-CM

## 2016-10-04 DIAGNOSIS — I429 Cardiomyopathy, unspecified: ICD-10-CM

## 2016-10-04 DIAGNOSIS — I509 Heart failure, unspecified: ICD-10-CM

## 2016-10-04 DIAGNOSIS — E782 Mixed hyperlipidemia: ICD-10-CM

## 2016-10-04 DIAGNOSIS — J449 Chronic obstructive pulmonary disease, unspecified: ICD-10-CM

## 2016-10-04 DIAGNOSIS — I2510 Atherosclerotic heart disease of native coronary artery without angina pectoris: ICD-10-CM

## 2016-10-04 DIAGNOSIS — J479 Bronchiectasis, uncomplicated: ICD-10-CM

## 2016-10-04 DIAGNOSIS — I10 Essential (primary) hypertension: Secondary | ICD-10-CM

## 2016-10-04 DIAGNOSIS — L03116 Cellulitis of left lower limb: ICD-10-CM

## 2016-10-04 DIAGNOSIS — K219 Gastro-esophageal reflux disease without esophagitis: ICD-10-CM

## 2016-10-04 DIAGNOSIS — I714 Abdominal aortic aneurysm, without rupture: ICD-10-CM

## 2016-10-04 DIAGNOSIS — Z9981 Dependence on supplemental oxygen: ICD-10-CM

## 2016-10-04 DIAGNOSIS — I5042 Chronic combined systolic (congestive) and diastolic (congestive) heart failure: ICD-10-CM

## 2016-10-04 DIAGNOSIS — R0600 Dyspnea, unspecified: ICD-10-CM

## 2016-10-04 DIAGNOSIS — R609 Edema, unspecified: ICD-10-CM

## 2016-10-04 DIAGNOSIS — M954 Acquired deformity of chest and rib: ICD-10-CM

## 2016-10-04 NOTE — Progress Notes
Date of Service: 10/03/2016    Subjective:             Karee Fausey. is a 76 y.o. male.    History of Present Illness    I had the opportunity to see [name] for his asthma.  This is a difficult time of year for him and he has been struggling with sinus drainage.  Constantly has a Kleenex with drainage.  This drainage goes down his throat and is very irritating.  Mr. Gregorich has an extremely sensitive cough and gag reflex.  He is struggling with increased cough and wheeze.  He has not been taking his anti-reflux meds because he ran out.  Denies significant reflux symptoms.  Mainly allergic in nature.  Allergy has given him a new medication for sinus drainage.  Minimally beneficial.       Review of Systems   Constitutional: Positive for fatigue.   HENT: Positive for congestion, postnasal drip, rhinorrhea, sinus pressure, sneezing and voice change.    Eyes: Negative.    Respiratory: Positive for cough, choking, shortness of breath and wheezing.    Cardiovascular: Negative.    Gastrointestinal: Negative.    Endocrine: Negative.    Genitourinary: Negative.    Musculoskeletal: Negative.    Skin: Negative.    Allergic/Immunologic: Positive for environmental allergies.   Neurological: Negative.    Hematological: Negative.    Psychiatric/Behavioral: Negative.          Objective:         ??? acetaminophen (TYLENOL) 500 mg tablet Take 2 tablets by mouth three times daily. Max of 4,000 mg of acetaminophen in 24 hours.   ??? acetaminophen/codeine (TYLENOL #3) 300/30 mg tablet Take 1 tablet by mouth every 6 hours as needed for Pain. Max of 4,000 mg of acetaminophen in 24 hours.   ??? albuterol (PROAIR HFA, VENTOLIN HFA, OR PROVENTIL HFA) 90 mcg/actuation inhaler Inhale two puffs by mouth into the lungs every 4 hours as needed for Wheezing or Shortness of Breath.   ??? albuterol 0.5% (PROVENTIL; VENTOLIN) 2.5 mg/0.5 mL nebulizer solution Inhale 0.5 mL solution by nebulizer as directed every 6 hours as needed for Shortness of Breath or Wheezing.   ??? albuterol-ipratropium (DUONEB) 0.5 mg-3 mg(2.5 mg base)/3 mL nebulizer solution Inhale 3 mL solution by nebulizer as directed every 6 hours as needed for Wheezing.   ??? allopurinol (ZYLOPRIM) 300 mg tablet Take 1 tablet by mouth daily.   ??? AMITRIPTYLINE HCL (AMITRIPTYLINE/GABAPENTIN/EMU OIL(#)) 05/24/08 % Apply 5 g topically to affected area three times daily.   ??? aspirin EC 81 mg tablet Take 81 mg by mouth daily.   ??? atorvastatin (LIPITOR) 20 mg tablet TAKE ONE TABLET BY MOUTH ONCE DAILY   ??? azithromycin (ZITHROMAX) 250 mg tablet Take 2 tabs by mouth on day 1, followed by 1 tab by mouth daily on days 2 - 5.   ??? benzonatate (TESSALON PERLES) 100 mg capsule Take one capsule by mouth three times daily as needed for Cough.   ??? budesonide/formoterol (SYMBICORT HFA) 160/4.5 mcg inhalation Inhale two puffs by mouth into the lungs twice daily. Rinse mouth after use.   ??? bumetanide (BUMEX) 2 mg tablet Take 2 tablets by mouth twice daily.   ??? cephalexin (KEFLEX) 500 mg capsule Take 500 mg by mouth three times daily.   ??? cetirizine (ZYRTEC) 10 mg tablet Take 10 mg by mouth every morning.   ??? cholecalciferol (VITAMIN D-3) 1,000 units tablet Take 1,000 Units by mouth  daily.   ??? codeine/guaiFENesin (ROBITUSSIN-AC) 10/100 mg/5 mL oral solution Take 5 mL by mouth every 4 hours as needed for Cough.   ??? duloxetine DR (CYMBALTA) 30 mg capsule Take 1 capsule by mouth daily.   ??? Flunisolide 25 mcg (0.025 %) spry Apply 2 sprays to each nostril as directed twice daily.   ??? gabapentin (NEURONTIN) 400 mg capsule Take 400 mg by mouth every 8 hours.   ??? GUAIFENESIN (MUCINEX PO) Take 1 tablet by mouth as Needed.   ??? ipratropium bromide (ATROVENT) 42 mcg (0.06 %) nasal spray 1 spray to each nostril Q6H PRN   ??? L.ACID/L.CASEI/B.BIF/B.LON/FOS (PROBIOTIC BLEND PO) Take 1 Cap by mouth.   ??? lidocaine (LIDODERM) 5 % topical patch Apply 1 patch topically to affected area every 24 hours. Apply patch for 12 hours, then remove for 12 hours before repeating.   ??? lutein-zeaxanthin 25-5 mg cap Take  by mouth.   ??? metoprolol XL (TOPROL XL) 25 mg extended release tablet Take 1 tablet by mouth daily.   ??? montelukast (SINGULAIR) 10 mg tablet TAKE ONE TABLET BY MOUTH AT BEDTIME. MAINTENANCE THERAPY FOR ASTHMA.   ??? mupirocin (BACTROBAN) 2 % topical ointment    ??? nebulizer compressor medical device Use as directed.   ??? nitroglycerin (NITROSTAT) 0.4 mg tablet Place 1 Tab under tongue every 5 minutes as needed for Chest Pain.   ??? oxyCODONE (ROXICODONE, OXY-IR) 5 mg tablet Take 1 tablet by mouth every 12 hours as needed for Pain (Patient taking differently: Take 5 mg by mouth three times daily )   ??? pantoprazole DR (PROTONIX) 40 mg tablet Take one tablet by mouth twice daily.   ??? potassium chloride SR (K-DUR) 20 mEq tablet Take 1 tablet by mouth daily. Take with a meal and a full glass of water.   ??? prednisone (DELTASONE) 20 mg tablet Take two tablets by mouth daily with breakfast.   ??? ranitidine(+) (ZANTAC) 150 mg tablet TAKE ONE TABLET BY MOUTH ONCE DAILY   ??? senna/docusate (SENOKOT-S) 8.6/50 mg tablet Take 1 Tab by mouth twice daily.   ??? sildenafil(+) (VIAGRA) 50 mg tablet Take 1 tablet by mouth as Needed for Erectile dysfunction.   ??? spironolactone (ALDACTONE) 25 mg tablet Take 1 tablet by mouth twice daily.   ??? tiZANidine (ZANAFLEX) 4 mg tablet Take 1 tablet by mouth every 6 hours as needed.     Vitals:    10/03/16 1313   BP: 137/79   Pulse: 94   Resp: 18   Temp: 36.8 ???C (98.3 ???F)   TempSrc: Oral   SpO2: 93%   Weight: 135.2 kg (298 lb)   Height: 180.3 cm (71)     Body mass index is 41.56 kg/m???.     Physical Exam   Constitutional: He is oriented to person, place, and time. No distress.   Morbidly obese male in NAD.  Aggressively coughing in clinic.  Struggles with coughing spasms.     HENT:   Head: Normocephalic and atraumatic.   Nasal drainage. Eyes: EOM are normal. Pupils are equal, round, and reactive to light. Right eye exhibits no discharge. Left eye exhibits no discharge.   Neck: Normal range of motion. Neck supple.   Cardiovascular: Normal rate, regular rhythm, normal heart sounds and intact distal pulses.  Exam reveals no gallop and no friction rub.    No murmur heard.  Pulmonary/Chest: Effort normal. No respiratory distress. He has wheezes. He has no rales. He exhibits no tenderness.  Musculoskeletal: He exhibits no edema or tenderness.   Neurological: He is alert and oriented to person, place, and time.   Skin: Skin is warm and dry. No rash noted. He is not diaphoretic. No erythema. No pallor.   Psychiatric: He has a normal mood and affect. His behavior is normal. Judgment and thought content normal.   Vitals reviewed.         Assessment and Plan:    I have not seen Mr. Rorabaugh since 2016.     1. Allergic asthma  - Acute exacerbation due to allergic rhinosinusitis.   -- Prednisone 40 mg daily for 5 days.   -- Z-pak if mucus production increases or develop fevers.  --Continue inhaled meds  --Continue zyrtec and singulair  --Following completion of steroids and resolution of exacerbation will ask pt to get repeat PFT's.  He would not be able to complete today due to coughing spasms.  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  -- Pt has been on Zyflo and Xolair in the past.  Not now.   -- Much of his previous symptoms were likely related to CHF and volume overload.  Once we introduced him to cardiology and we were able to get his CHF under control his lung symptoms improved.  He experiences the majority of his acute dyspnea with CHF exacerbations.  2. Allergic rhinitis  -- This is his worst time of year.  Rhinitis is driver of all his symptoms.  -- Continue Singulair   -- Management per allergy.???  3. Chronic sinusitis  -- Stable  4. Bronchiectasis  -- Proven RML bronchiectasis on previous outside CT scan. Continue aggressive pulmonary clearance mechanisms with flutter valve as needed.  I have encouraged him to use his albuterol nebs BID followed by his flutter valve.  Does not make significant amounts of sputum.   -- Will plan for repeat CT once exacerbation resolved. ???  5. Possible aspiration  -- Swallow study was negative for aspiration.  6. GERD on PPI  -- Restart anti-reflux meds.  -- Regimen per GI.  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 Aortic aneurysm s/p endovascular repair  -- Follows up with Dr. Chales Abrahams.  9. CAD/CHF  -- Stable.  Monitors his weight closely and watches intake.  -- Currently compensated per pt and wife report.  10. OSA  -- Stable CPAP use.  -- Gaining good clinical benefit and using regularly. ???  -- We will put through paperwork for supplies. ???  11. Morbid obesity  -- Continue weight management and increased activity.  12. Physical deconditioning  -- Stable activity.  13. CKD  -- Most recent chemistry shows improving creatinine to 1.4.  ???  RTC in 3 months.  Vaccinations UTD  I will call pt with results of PFT's when available.   We will call to check on him early next week.

## 2016-10-05 ENCOUNTER — Encounter: Admit: 2016-10-05 | Discharge: 2016-10-05 | Payer: MEDICARE

## 2016-10-05 NOTE — Telephone Encounter
Called patient with update to start Z-pak prescribed. I told him Ruben Wood would still be contacting him sometime next week to check in. He had no further questions at this time.   Josephina Shih, LPN

## 2016-10-05 NOTE — Telephone Encounter
Received call from patients wife stating Ruben Wood's cough has worsened since Tuesday and the wheezing has become more frequent. He started on the prednisone but has not started Z-Pak. Patient was not feeling well enough to have labs drawn the day of the appointment and will have them drawn tomorrow while here seeing PCP. Routing to Dr.Sharpe for recommendation.   Josephina Shih, LPN

## 2016-10-06 ENCOUNTER — Encounter: Admit: 2016-10-06 | Discharge: 2016-10-06 | Payer: MEDICARE

## 2016-10-06 ENCOUNTER — Ambulatory Visit: Admit: 2016-10-06 | Discharge: 2016-10-06 | Payer: MEDICARE

## 2016-10-06 ENCOUNTER — Ambulatory Visit: Admit: 2016-10-06 | Discharge: 2016-10-07 | Payer: MEDICARE

## 2016-10-06 DIAGNOSIS — G4733 Obstructive sleep apnea (adult) (pediatric): ICD-10-CM

## 2016-10-06 DIAGNOSIS — E669 Obesity, unspecified: ICD-10-CM

## 2016-10-06 DIAGNOSIS — I429 Cardiomyopathy, unspecified: ICD-10-CM

## 2016-10-06 DIAGNOSIS — Z9981 Dependence on supplemental oxygen: ICD-10-CM

## 2016-10-06 DIAGNOSIS — E611 Iron deficiency: ICD-10-CM

## 2016-10-06 DIAGNOSIS — Z95828 Presence of other vascular implants and grafts: ICD-10-CM

## 2016-10-06 DIAGNOSIS — G4730 Sleep apnea, unspecified: ICD-10-CM

## 2016-10-06 DIAGNOSIS — I10 Essential (primary) hypertension: Secondary | ICD-10-CM

## 2016-10-06 DIAGNOSIS — J189 Pneumonia, unspecified organism: ICD-10-CM

## 2016-10-06 DIAGNOSIS — Z8679 Personal history of other diseases of the circulatory system: ICD-10-CM

## 2016-10-06 DIAGNOSIS — M954 Acquired deformity of chest and rib: ICD-10-CM

## 2016-10-06 DIAGNOSIS — R05 Cough: ICD-10-CM

## 2016-10-06 DIAGNOSIS — K219 Gastro-esophageal reflux disease without esophagitis: ICD-10-CM

## 2016-10-06 DIAGNOSIS — I2510 Atherosclerotic heart disease of native coronary artery without angina pectoris: ICD-10-CM

## 2016-10-06 DIAGNOSIS — Z8614 Personal history of Methicillin resistant Staphylococcus aureus infection: ICD-10-CM

## 2016-10-06 DIAGNOSIS — J4541 Moderate persistent asthma with (acute) exacerbation: ICD-10-CM

## 2016-10-06 DIAGNOSIS — R0600 Dyspnea, unspecified: ICD-10-CM

## 2016-10-06 DIAGNOSIS — R609 Edema, unspecified: ICD-10-CM

## 2016-10-06 DIAGNOSIS — E782 Mixed hyperlipidemia: ICD-10-CM

## 2016-10-06 DIAGNOSIS — J449 Chronic obstructive pulmonary disease, unspecified: ICD-10-CM

## 2016-10-06 DIAGNOSIS — E785 Hyperlipidemia, unspecified: ICD-10-CM

## 2016-10-06 DIAGNOSIS — J302 Other seasonal allergic rhinitis: ICD-10-CM

## 2016-10-06 DIAGNOSIS — F331 Major depressive disorder, recurrent, moderate: Principal | ICD-10-CM

## 2016-10-06 DIAGNOSIS — N183 Chronic kidney disease, stage 3 (moderate): ICD-10-CM

## 2016-10-06 DIAGNOSIS — I509 Heart failure, unspecified: ICD-10-CM

## 2016-10-06 DIAGNOSIS — I714 Abdominal aortic aneurysm, without rupture: ICD-10-CM

## 2016-10-06 DIAGNOSIS — G6289 Other specified polyneuropathies: ICD-10-CM

## 2016-10-06 DIAGNOSIS — J45909 Unspecified asthma, uncomplicated: ICD-10-CM

## 2016-10-06 DIAGNOSIS — J479 Bronchiectasis, uncomplicated: ICD-10-CM

## 2016-10-06 DIAGNOSIS — I5042 Chronic combined systolic (congestive) and diastolic (congestive) heart failure: ICD-10-CM

## 2016-10-06 DIAGNOSIS — I83029 Varicose veins of left lower extremity with ulcer of unspecified site: ICD-10-CM

## 2016-10-06 DIAGNOSIS — L03116 Cellulitis of left lower limb: ICD-10-CM

## 2016-10-06 LAB — BASIC METABOLIC PANEL
Lab: 1.8 mg/dL — ABNORMAL HIGH (ref 0.4–1.24)
Lab: 137 MMOL/L (ref 137–147)
Lab: 36 mL/min — ABNORMAL LOW (ref >60)
Lab: 43 mL/min — ABNORMAL LOW (ref >60)
Lab: 5 MMOL/L (ref 3.5–5.1)
Lab: 9.6 mg/dL (ref 8.5–10.6)
Lab: 99 MMOL/L (ref 98–110)

## 2016-10-06 LAB — IRON + BINDING CAPACITY + %SAT+ FERRITIN
Lab: 111 ug/dL (ref 50–185)
Lab: 38 ng/mL — ABNORMAL HIGH (ref 30–300)
Lab: 499 ug/dL — ABNORMAL HIGH (ref 270–380)

## 2016-10-06 MED ORDER — DULOXETINE 60 MG PO CPDR
60 mg | ORAL_CAPSULE | Freq: Every day | ORAL | 3 refills | 60.00000 days | Status: AC
Start: 2016-10-06 — End: 2018-03-13

## 2016-10-06 MED ORDER — GABAPENTIN 400 MG PO CAP
400 mg | ORAL_CAPSULE | ORAL | 3 refills | Status: AC
Start: 2016-10-06 — End: 2018-01-08

## 2016-10-06 MED ORDER — ALLOPURINOL 300 MG PO TAB
300 mg | ORAL_TABLET | Freq: Every day | ORAL | 3 refills | 60.00000 days | Status: AC
Start: 2016-10-06 — End: 2017-02-06
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
## 2016-10-06 NOTE — Progress Notes
History of Present Illness  Ruben Wood. is a 76 y.o. male with multiple medical problems including CAD, Systolic and Diastolic HF, HLD, OSA on CPAP, morbid obesity, chronic pain, and COPD who presents today for comprehensive follow up     He has been having increasing cough and shortness of breath the past few days.  He contacted Dr. sharps office and was started on azithromycin and prednisone.  He has not noticed much improvement yet.  He still having a lot of cough as well as some sputum production.  He denies any fevers or chills.  Does have some slight increase in shortness of breath.  He denies any pleuritic chest pain.    He has been having increased depression just feeling down about all of his medical conditions.  We started him on duloxetine last visit he has noticed a mild improvement.  Is also seeing Dr. Georjean Mode now.  He denies any anxiety symptoms or any suicidal ideation.    He has peripheral neuropathy with some symptoms that are suggestive of complex regional pain syndrome.  He gets a sharp pain on the lateral aspect of his left foot.  Very minimal stimulation can make this worse.  However, since starting gabapentin, this has improved overall.  He also notes some improvement with the addition of duloxetine as well.    He has a wound on his left lateral lower extremity.  He does have severe venous insufficiency as well as venous stasis changes.  This wound is improving.  He is getting wound care through the vascular center.  He has not noticed any fevers or chills or drainage.    He is worried about his weight.  He really did quite well after doing weight management program, but he start putting weight back on.  He never Dr. Georjean Mode today and they discussed some strategies on weight loss.  He is quite motivated.       Review of Systems   Constitutional: Negative for chills and fever.   HENT: Positive for congestion.    Respiratory: Positive for cough, shortness of breath and wheezing. Negative for chest tightness.    Cardiovascular: Negative for chest pain and leg swelling.   Skin: Positive for wound. Negative for color change.   Psychiatric/Behavioral: Positive for dysphoric mood. Negative for sleep disturbance.       Objective:         ??? acetaminophen (TYLENOL) 500 mg tablet Take 2 tablets by mouth three times daily. Max of 4,000 mg of acetaminophen in 24 hours.   ??? acetaminophen/codeine (TYLENOL #3) 300/30 mg tablet Take 1 tablet by mouth every 6 hours as needed for Pain. Max of 4,000 mg of acetaminophen in 24 hours.   ??? albuterol (PROAIR HFA, VENTOLIN HFA, OR PROVENTIL HFA) 90 mcg/actuation inhaler Inhale two puffs by mouth into the lungs every 4 hours as needed for Wheezing or Shortness of Breath.   ??? albuterol 0.5% (PROVENTIL; VENTOLIN) 2.5 mg/0.5 mL nebulizer solution Inhale 0.5 mL solution by nebulizer as directed every 6 hours as needed for Shortness of Breath or Wheezing.   ??? albuterol-ipratropium (DUONEB) 0.5 mg-3 mg(2.5 mg base)/3 mL nebulizer solution Inhale 3 mL solution by nebulizer as directed every 6 hours as needed for Wheezing.   ??? allopurinol (ZYLOPRIM) 300 mg tablet Take one tablet by mouth daily.   ??? AMITRIPTYLINE HCL (AMITRIPTYLINE/GABAPENTIN/EMU OIL(#)) 05/24/08 % Apply 5 g topically to affected area three times daily.   ??? aspirin EC 81 mg tablet Take  81 mg by mouth daily.   ??? atorvastatin (LIPITOR) 20 mg tablet TAKE ONE TABLET BY MOUTH ONCE DAILY   ??? azithromycin (ZITHROMAX) 250 mg tablet Take 2 tabs by mouth on day 1, followed by 1 tab by mouth daily on days 2 - 5.   ??? benzonatate (TESSALON PERLES) 100 mg capsule Take one capsule by mouth three times daily as needed for Cough.   ??? budesonide/formoterol (SYMBICORT HFA) 160/4.5 mcg inhalation Inhale two puffs by mouth into the lungs twice daily. Rinse mouth after use.   ??? bumetanide (BUMEX) 2 mg tablet Take 2 tablets by mouth twice daily.   ??? cephalexin (KEFLEX) 500 mg capsule Take 500 mg by mouth three times daily. ??? cetirizine (ZYRTEC) 10 mg tablet Take 10 mg by mouth every morning.   ??? cholecalciferol (VITAMIN D-3) 1,000 units tablet Take 1,000 Units by mouth daily.   ??? codeine/guaiFENesin (ROBITUSSIN-AC) 10/100 mg/5 mL oral solution Take 5 mL by mouth every 4 hours as needed for Cough.   ??? duloxetine DR (CYMBALTA) 60 mg capsule Take one capsule by mouth daily.   ??? Flunisolide 25 mcg (0.025 %) spry Apply 2 sprays to each nostril as directed twice daily.   ??? gabapentin (NEURONTIN) 400 mg capsule Take one capsule by mouth every 8 hours.   ??? GUAIFENESIN (MUCINEX PO) Take 1 tablet by mouth as Needed.   ??? ipratropium bromide (ATROVENT) 42 mcg (0.06 %) nasal spray 1 spray to each nostril Q6H PRN   ??? L.ACID/L.CASEI/B.BIF/B.LON/FOS (PROBIOTIC BLEND PO) Take 1 Cap by mouth.   ??? lidocaine (LIDODERM) 5 % topical patch Apply 1 patch topically to affected area every 24 hours. Apply patch for 12 hours, then remove for 12 hours before repeating.   ??? lutein-zeaxanthin 25-5 mg cap Take  by mouth.   ??? metoprolol XL (TOPROL XL) 25 mg extended release tablet Take 1 tablet by mouth daily.   ??? montelukast (SINGULAIR) 10 mg tablet TAKE ONE TABLET BY MOUTH AT BEDTIME. MAINTENANCE THERAPY FOR ASTHMA.   ??? mupirocin (BACTROBAN) 2 % topical ointment    ??? nebulizer compressor medical device Use as directed.   ??? nitroglycerin (NITROSTAT) 0.4 mg tablet Place 1 Tab under tongue every 5 minutes as needed for Chest Pain.   ??? oxyCODONE (ROXICODONE, OXY-IR) 5 mg tablet Take 1 tablet by mouth every 12 hours as needed for Pain (Patient taking differently: Take 5 mg by mouth three times daily )   ??? pantoprazole DR (PROTONIX) 40 mg tablet Take one tablet by mouth twice daily.   ??? potassium chloride SR (K-DUR) 20 mEq tablet Take 1 tablet by mouth daily. Take with a meal and a full glass of water.   ??? prednisone (DELTASONE) 20 mg tablet Take two tablets by mouth daily with breakfast.   ??? ranitidine(+) (ZANTAC) 150 mg tablet TAKE ONE TABLET BY MOUTH ONCE DAILY ??? senna/docusate (SENOKOT-S) 8.6/50 mg tablet Take 1 Tab by mouth twice daily.   ??? sildenafil(+) (VIAGRA) 50 mg tablet Take 1 tablet by mouth as Needed for Erectile dysfunction.   ??? spironolactone (ALDACTONE) 25 mg tablet Take 1 tablet by mouth twice daily.   ??? tiZANidine (ZANAFLEX) 4 mg tablet Take 1 tablet by mouth every 6 hours as needed.     Vitals:    10/06/16 1133   BP: 131/75   Pulse: 71   Resp: 18   Temp: 36.4 ???C (97.5 ???F)       Body mass index is 40.69 kg/m???.  Wt Readings from Last 5 Encounters:   10/06/16 136.1 kg (300 lb)   10/03/16 135.2 kg (298 lb)   09/20/16 133.8 kg (295 lb)   09/14/16 135.4 kg (298 lb 6.4 oz)   09/12/16 133.4 kg (294 lb)       Physical Exam   Constitutional: He appears well-developed and well-nourished. No distress.   HENT:   Head: Normocephalic and atraumatic.   Mouth/Throat: Oropharynx is clear and moist. No oropharyngeal exudate.   Pain in maxillary sinuses   Eyes: Conjunctivae and EOM are normal. Pupils are equal, round, and reactive to light. Right eye exhibits no discharge. Left eye exhibits no discharge. No scleral icterus.   Neck: Neck supple. No JVD present. No thyromegaly present.   Cardiovascular: Normal rate, regular rhythm and normal heart sounds.  Exam reveals no gallop and no friction rub.    No murmur heard.  Pulmonary/Chest: Effort normal. No respiratory distress. He has wheezes. He has rales.   No accessory muscle use   Abdominal: Soft. Bowel sounds are normal. He exhibits no distension. There is no tenderness. There is no rebound and no guarding.   Musculoskeletal: He exhibits no edema.   Lymphadenopathy:     He has no cervical adenopathy.   Neurological: He is alert.   Skin: Skin is warm and dry. No rash noted. He is not diaphoretic. There is erythema. No pallor.   Venous stasis skin changes, superificial wound with erythematous base, serous drainage   Psychiatric: He has a normal mood and affect.   Nursing note and vitals reviewed.    Labs Reviewed: Lab Results   Component Value Date/Time    HGBA1C 5.4 02/25/2016 11:50 AM    HGBA1C 6.2 (H) 03/01/2015 02:18 PM    HGBA1C 5.3 12/15/2014 08:46 AM    HGBPOC 11.2 (L) 10/25/2012 10:58 AM    HGBPOC 12.9 (L) 10/25/2012 08:55 AM    TSH 2.160 03/01/2015 02:18 PM    FREET4R 0.99 12/15/2014 09:12 AM    CHOL 124 02/25/2016 11:50 AM    TRIG 120 02/25/2016 11:50 AM    HDL 32 (L) 02/25/2016 11:50 AM    LDL 76 02/25/2016 11:50 AM    NA 136 (L) 09/12/2016 12:07 PM    K 4.7 09/12/2016 12:07 PM    CL 98 09/12/2016 12:07 PM    CO2 29 09/12/2016 12:07 PM    GAP 9 09/12/2016 12:07 PM    BUN 40 (H) 09/12/2016 12:07 PM    CR 1.50 (H) 09/12/2016 12:07 PM    GLU 157 (H) 09/12/2016 12:07 PM    CA 9.6 09/12/2016 12:07 PM    PO4 3.3 05/19/2014 04:40 PM    ALBUMIN 3.8 06/26/2016 11:00 AM    TOTPROT 7.1 06/26/2016 11:00 AM    ALKPHOS 85 06/26/2016 11:00 AM    AST 17 06/26/2016 11:00 AM    ALT 23 06/26/2016 11:00 AM    TOTBILI 0.8 06/26/2016 11:00 AM    GFR 45 (L) 09/12/2016 12:07 PM    GFRAA 55 (L) 09/12/2016 12:07 PM         Assessment and Plan:Ruben Wood. is a 76 y.o. 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 He follows with Dr. [name] in pulmonary clinic.    -  Agree with azithromycin and prednisone for COPD exacerbation, mild improvement over last day subjectively   -Continue symbicort and duonebs prn  -Counseled on importance of weight loss.   -Vaccines: PPV 13 11/2014, PPV 23 04/2016  -The patient needs a nebulizer for nebulized medication, so order faxed to DME provider    Depression: new problem, mostly focused on multiple health problems.   -Increase duloxetine to 60mg  Qday  -Referral to Dr. Georjean Mode Lower Extremity Wound: improving, started as allergic reaction to new cleanser, does not appear infected currently.   -Continue wound care     Peripheral Neuropathy:   -Continue gabapenting 400mg  TID  -Increase duloxetine to 60mg  Qday    Obesity: uncontrolled and worsening, 2/2 dietary indiscretion.  His depression may also be interfering    -Encouraged to get back on the weight program  -Referral to Dr. Georjean Mode for behavioral health consult    CHRONIC PROBLEMS NOT ADDRESSED TODAY:     Lumbar Back Pain: stable, but still with a lot of sciatica on right side.  He saw Dr. Salley Scarlet on 6/20.  -Physical Therapy  -Follow up with Dr. Salley Scarlet  -Continue gabapentin again to 400mg  TID    Chronic Systolic and Diastolic Heart Failure: stable overall, follows closely with HF clinic.   Last Echo: 04/2016 with LVEF 40% with severe LV dilation and mild mitral regurgitation  NYHA Classification: III  Beta Blocker: Metoprolol XL 25mg  Qday  ACEI/ARB: Not currently given recurrent AKI and low BPs  Aldosterone Antagonist: Spironolactone 25mg  Qday  Diuretic: Bumex 2mg  Qday  Dry Weight: ?  Tobacco: Former, 40 pk year history.  Quit 2002.   Treatment goals:  BP <140/90       Counseled on limiting salt intake to < 2G per day  Cardiac Rehab: Not currently  Avoid NSAIDS, CCB, and TZDs    CAD: stable, Cath from 08/2012 showed CTO of RCA, CTO of obtuse marginal branch and high grade stenosis of 90% in mid LCx.  No significant stenosis in LAD or left main.  He had failed attempt in 09/2012 to open these CTOs.    The patient has not had anginal symptoms  Beta Blocker: Metoprolol XL 25mg  Qday  Anti-platelet: Aspirin 81mg  Qday  Statin: Atorvastatin 20mg  Qhs  Nitrate: SL Nitro PRN  Tobacco: Former, 40 pk year history.  Quit 2002.   Treatment goals:  A1C < or = 7.0       BP <140/90                               LDL < 70                               BMI goal < 25  Vaccines: Flu yearly    S/p PPM: stable, follows with Dr. Jacqulyn Ducking    OSA: on CPAP, doing well -Continue CPAP    Hypertension, goal < 140/90: well controlled  -Continue metoprolol  -Continue spironolactone    Hyperlipidemia, goal LDL < 70: close to goal, LDL 76  -Continue atorvastatin    CKD Stage III: improved to resolved, baseline Cr between 1.3-1.5 historically.    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  He follows with Dr. Chales Abrahams for this.  CTA 07/2016 with decreased size.     Pre-diabetes:   -Continue with weight loss  RTC: 3 months for comprehensive follow up    CCM: Enrolled previously      Patient Instructions     Future Appointments  Date Time Provider Department Center   10/11/2016 10:15 AM Remonia Richter, MD [facility]   10/31/2016 8:00 PM [facility] REMOTE MONITORING [facility] Remote   11/21/2016 8:00 AM [facility] REMOTE MONITORING [facility]EMOTEHRM [facility] Remote   12/05/2016 8:00 PM [facility] REMOTE MONITORING MACREMOTEHRM [facility] Remote   12/07/2016 9:40 AM Vena Austria, MD [facility] [place]   12/28/2016 10:00 AM Blanca Friend, MD [facility]ALL[facility]Y [facility]   01/09/2017 8:00 PM [facility] REMOTE MONITORING MACREMOTEHRM [facility] Remote   01/16/2017 3:30 PM Lady Saucier, MD IMPULMON [facility]   02/14/2017 8:00 PM [facility] REMOTE MONITORING [facility]EMOTEHRM [facility] Remote   03/20/2017 8:00 PM [facility] REMOTE MONITORING MACREMOTEHRM [facility] Remote   04/24/2017 8:00 PM [facility] REMOTE MONITORING MACREMOTEHRM [facility] Remote   05/29/2017 8:00 PM [facility] REMOTE MONITORING MACREMOTEHRM [facility] Remote   07/03/2017 8:00 PM [facility] REMOTE MONITORING MACREMOTEHRM [facility] Remote   08/07/2017 8:00 PM [facility] REMOTE MONITORING MACREMOTEHRM [facility] Remote   09/11/2017 8:00 PM [facility] REMOTE MONITORING MACREMOTEHRM MAC Remote       General Instructions:  ??? How to reach me:?????? Please send a MyChart message to the General Medicine clinic or call Kim at [phone]. ???  ??? How to get a medication refill:??? Please use the MyChart Refill request or contact your pharmacy directly to request medication refills. Please allow 48 hours.??? ??? ??? How to receive your test results:??? If you have signed up for MyChart, you will receive your test results and messages from me this way.??? Otherwise, you will get a phone call or letter.?????? If you are expecting results and have not heard from my office within 2 weeks of your testing, please send a MyChart message or call my office.??? ???  ??? Scheduling:??? Our Scheduling phone number is [phone].??? Same Day appointments are available. Please ask for an annual or yearly physical appointment if it has been over 1 year since our last appointment.  ??? Appointment Reminders on your cell phone: Make sure we have your cell phone number, and Text [place] to [phone].  ??? Support groups for many chronic illnesses are available through [facility]: [email or web address] or [phone].               Return in about 4 weeks (around 11/03/2016).

## 2016-10-09 ENCOUNTER — Encounter: Admit: 2016-10-09 | Discharge: 2016-10-09 | Payer: MEDICARE

## 2016-10-09 DIAGNOSIS — E611 Iron deficiency: Principal | ICD-10-CM

## 2016-10-09 MED ORDER — FERRIC CARBOXYMALTOSE IVPB
750 mg | Freq: Once | INTRAVENOUS | 0 refills | Status: CN
Start: 2016-10-09 — End: ?

## 2016-10-09 MED ORDER — BUMETANIDE 2 MG PO TAB
2 mg | ORAL_TABLET | Freq: Two times a day (BID) | ORAL | 3 refills | Status: AC
Start: 2016-10-09 — End: 2016-10-09

## 2016-10-09 MED ORDER — BUMETANIDE 2 MG PO TAB
2 mg | ORAL_TABLET | Freq: Two times a day (BID) | ORAL | 3 refills | Status: AC
Start: 2016-10-09 — End: 2017-04-08

## 2016-10-09 NOTE — Progress Notes
Medicare (Primary) no pre cert needed.

## 2016-10-09 NOTE — Telephone Encounter
Called patient to set up appointment for iron infusion.  Patient's wife, [name], answered call since patient was driving.  [name] already spoke with Matthew Folks, [facility] coordinator, and was aware of request for patient to receive IV iron.  Patient and wife agreeable to come for first infusion tomorrow, 8/21, at 1130.  instructed [name] to have patient check in at heart center admitting desk at 1115.  HF Infusion clinic phone number provided.  [name] verbalizes understanding, denies further questions at this time.

## 2016-10-10 ENCOUNTER — Encounter: Admit: 2016-10-10 | Discharge: 2016-10-10 | Payer: MEDICARE

## 2016-10-10 ENCOUNTER — Ambulatory Visit: Admit: 2016-10-10 | Discharge: 2016-10-11 | Payer: MEDICARE

## 2016-10-10 DIAGNOSIS — E611 Iron deficiency: Principal | ICD-10-CM

## 2016-10-10 DIAGNOSIS — I5042 Chronic combined systolic (congestive) and diastolic (congestive) heart failure: Principal | ICD-10-CM

## 2016-10-10 LAB — IMMUNOGLOBULIN E (IGE): Lab: 31 [IU]/mL — ABNORMAL LOW (ref <165)

## 2016-10-10 MED ORDER — FERRIC CARBOXYMALTOSE IVPB
750 mg | Freq: Once | INTRAVENOUS | 0 refills | Status: CN
Start: 2016-10-10 — End: ?

## 2016-10-10 MED ORDER — FERRIC CARBOXYMALTOSE IVPB
750 mg | Freq: Once | INTRAVENOUS | 0 refills | Status: CP
Start: 2016-10-10 — End: ?
  Administered 2016-10-10 (×2): 750 mg via INTRAVENOUS

## 2016-10-10 NOTE — Progress Notes
Medicare is listed as patient's primary insurance coverage.  Pre-certification is not required for hospitalizations.

## 2016-10-10 NOTE — Telephone Encounter
RN called to check Ed's symptoms following completion of prednisone burst and Z pack over the weekend. Pt's wife, [name], reports pt is feeling better overall with both coughing and wheezing significantly improved. Pt continues to wear O2 bleed-in at night.     Patient is currently at [facility] for Fe supplementation.

## 2016-10-10 NOTE — Progress Notes
Patient received Injectafer 750 mg: Dose 1/2 per eMAR. Patient verbalized understanding regarding indication and possible side effects of iron infusion.     Patient tolerated infusion without complications, no concerns noted at this time.     Patient monitored for 30 minutes post iron infusion. VSS, see below:       10/10/16 1255   Vitals   Temp 36.6 C (97.8 F)   Temperature Source Axillary   Pulse 65   Respirations 20 PER MINUTE   SpO2 96 %   BP 123/50   BP Source Arm, Right Upper   BP Patient Position Chair       No phlebitis or infiltration noted around IV site at this time.

## 2016-10-11 ENCOUNTER — Encounter: Admit: 2016-10-11 | Discharge: 2016-10-11 | Payer: MEDICARE

## 2016-10-11 ENCOUNTER — Ambulatory Visit: Admit: 2016-10-11 | Discharge: 2016-10-12 | Payer: MEDICARE

## 2016-10-11 DIAGNOSIS — J189 Pneumonia, unspecified organism: ICD-10-CM

## 2016-10-11 DIAGNOSIS — R609 Edema, unspecified: ICD-10-CM

## 2016-10-11 DIAGNOSIS — M961 Postlaminectomy syndrome, not elsewhere classified: ICD-10-CM

## 2016-10-11 DIAGNOSIS — E782 Mixed hyperlipidemia: ICD-10-CM

## 2016-10-11 DIAGNOSIS — Z8614 Personal history of Methicillin resistant Staphylococcus aureus infection: ICD-10-CM

## 2016-10-11 DIAGNOSIS — E785 Hyperlipidemia, unspecified: ICD-10-CM

## 2016-10-11 DIAGNOSIS — L03116 Cellulitis of left lower limb: ICD-10-CM

## 2016-10-11 DIAGNOSIS — Z95828 Presence of other vascular implants and grafts: ICD-10-CM

## 2016-10-11 DIAGNOSIS — G4733 Obstructive sleep apnea (adult) (pediatric): ICD-10-CM

## 2016-10-11 DIAGNOSIS — K219 Gastro-esophageal reflux disease without esophagitis: ICD-10-CM

## 2016-10-11 DIAGNOSIS — I2510 Atherosclerotic heart disease of native coronary artery without angina pectoris: Secondary | ICD-10-CM

## 2016-10-11 DIAGNOSIS — I10 Essential (primary) hypertension: Secondary | ICD-10-CM

## 2016-10-11 DIAGNOSIS — I429 Cardiomyopathy, unspecified: ICD-10-CM

## 2016-10-11 DIAGNOSIS — Z9981 Dependence on supplemental oxygen: ICD-10-CM

## 2016-10-11 DIAGNOSIS — J302 Other seasonal allergic rhinitis: ICD-10-CM

## 2016-10-11 DIAGNOSIS — J449 Chronic obstructive pulmonary disease, unspecified: ICD-10-CM

## 2016-10-11 DIAGNOSIS — I509 Heart failure, unspecified: ICD-10-CM

## 2016-10-11 DIAGNOSIS — J479 Bronchiectasis, uncomplicated: ICD-10-CM

## 2016-10-11 DIAGNOSIS — N183 Chronic kidney disease, stage 3 (moderate): ICD-10-CM

## 2016-10-11 DIAGNOSIS — J45909 Unspecified asthma, uncomplicated: ICD-10-CM

## 2016-10-11 DIAGNOSIS — R05 Cough: ICD-10-CM

## 2016-10-11 DIAGNOSIS — E669 Obesity, unspecified: ICD-10-CM

## 2016-10-11 DIAGNOSIS — M954 Acquired deformity of chest and rib: ICD-10-CM

## 2016-10-11 DIAGNOSIS — I5042 Chronic combined systolic (congestive) and diastolic (congestive) heart failure: ICD-10-CM

## 2016-10-11 DIAGNOSIS — I714 Abdominal aortic aneurysm, without rupture: ICD-10-CM

## 2016-10-11 DIAGNOSIS — Z8679 Personal history of other diseases of the circulatory system: ICD-10-CM

## 2016-10-11 DIAGNOSIS — G4730 Sleep apnea, unspecified: ICD-10-CM

## 2016-10-11 DIAGNOSIS — R0600 Dyspnea, unspecified: ICD-10-CM

## 2016-10-12 DIAGNOSIS — M4807 Spinal stenosis, lumbosacral region: Principal | ICD-10-CM

## 2016-10-13 ENCOUNTER — Encounter: Admit: 2016-10-13 | Discharge: 2016-10-13 | Payer: MEDICARE

## 2016-10-13 DIAGNOSIS — I5042 Chronic combined systolic (congestive) and diastolic (congestive) heart failure: Principal | ICD-10-CM

## 2016-10-13 LAB — BASIC METABOLIC PANEL
Lab: 1.9 — ABNORMAL HIGH (ref 0.8–1.3)
Lab: 127 — ABNORMAL HIGH (ref 65–110)
Lab: 137 % (ref 4–12)
Lab: 30
Lab: 33
Lab: 37
Lab: 5 % (ref 0–5)
Lab: 63 — ABNORMAL HIGH (ref 7.0–18.0)
Lab: 8.8 K/UL — ABNORMAL LOW (ref 1.8–7.0)
Lab: 98 % (ref 0–2)

## 2016-10-18 ENCOUNTER — Ambulatory Visit: Admit: 2016-10-18 | Discharge: 2016-10-19 | Payer: MEDICARE

## 2016-10-18 ENCOUNTER — Ambulatory Visit: Admit: 2016-10-18 | Discharge: 2016-10-18 | Payer: MEDICARE

## 2016-10-18 DIAGNOSIS — E611 Iron deficiency: Principal | ICD-10-CM

## 2016-10-18 DIAGNOSIS — R05 Cough: ICD-10-CM

## 2016-10-18 MED ORDER — FERRIC CARBOXYMALTOSE IVPB
750 mg | Freq: Once | INTRAVENOUS | 0 refills | Status: CN
Start: 2016-10-18 — End: ?

## 2016-10-18 MED ORDER — ASCORBIC ACID (VITAMIN C) 500 MG PO TAB
500 mg | ORAL_TABLET | Freq: Every day | ORAL | 3 refills | 50.00000 days | Status: AC
Start: 2016-10-18 — End: 2019-07-28

## 2016-10-18 MED ORDER — FERRIC CARBOXYMALTOSE IVPB
750 mg | Freq: Once | INTRAVENOUS | 0 refills | Status: CP
Start: 2016-10-18 — End: ?
  Administered 2016-10-18 (×2): 750 mg via INTRAVENOUS

## 2016-10-18 MED ORDER — FERROUS SULFATE 325 MG (65 MG IRON) PO TAB
325 mg | ORAL_TABLET | Freq: Every day | ORAL | 3 refills | Status: AC
Start: 2016-10-18 — End: 2018-02-15

## 2016-10-18 NOTE — Progress Notes
Patient received Injectafer 750 mg: Dose 2/2 per eMAR. Patient verbalized understanding regarding indication and possible side effects of iron infusion.     Patient tolerated infusion without complications, no concerns noted at this time.     Patient monitored for 30 minutes post iron infusion. VSS, see below:       10/18/16 1250   Vitals   Temp 36.4 C (97.6 F)   Temperature Source Oral   Pulse 68   Respirations 20 PER MINUTE   SpO2 97 %   BP 134/59   Mean NBP (Calculated) 84 MM HG   BP Source Arm, Right Upper   BP Patient Position Chair     No phlebitis or infiltration noted around IV site at this time.

## 2016-10-30 ENCOUNTER — Encounter: Admit: 2016-10-30 | Discharge: 2016-10-30 | Payer: MEDICARE

## 2016-10-30 DIAGNOSIS — R0989 Other specified symptoms and signs involving the circulatory and respiratory systems: Principal | ICD-10-CM

## 2016-10-30 DIAGNOSIS — R05 Cough: ICD-10-CM

## 2016-10-30 DIAGNOSIS — R062 Wheezing: ICD-10-CM

## 2016-10-30 NOTE — Telephone Encounter
RN called patient. He reports developing respiratory illness 6 days ago, with wheezing, nasal/ sinus congestion, PND, and PC. Sputum became brownish green over the weekend, but has turned yellow today. Sputum is thick, and patient intermittently has difficulty mobilizing secretions. Denies fevers or chills. Pt feels GERD well controlled, and denies increased edema today.     Patient is currently taking Symbicort bid, Duoneb Q 6 hours, Singulair, Mucinex bid, and expectorant. Taking diuretics and PPI and H2 blockers as directed.     Patient denies any recent sick contacts.    RN will forward to Dr. [name] for recommendations.

## 2016-10-31 DIAGNOSIS — I5042 Chronic combined systolic (congestive) and diastolic (congestive) heart failure: Principal | ICD-10-CM

## 2016-10-31 MED ORDER — DOXYCYCLINE HYCLATE 100 MG PO TAB
100 mg | ORAL_TABLET | Freq: Two times a day (BID) | ORAL | 0 refills | Status: SS
Start: 2016-10-31 — End: 2016-11-16

## 2016-11-01 ENCOUNTER — Ambulatory Visit: Admit: 2016-11-01 | Discharge: 2016-11-01 | Payer: MEDICARE

## 2016-11-02 ENCOUNTER — Encounter: Admit: 2016-11-02 | Discharge: 2016-11-03 | Payer: MEDICARE

## 2016-11-03 ENCOUNTER — Encounter: Admit: 2016-11-03 | Discharge: 2016-11-03 | Payer: MEDICARE

## 2016-11-03 ENCOUNTER — Encounter: Admit: 2016-11-03 | Discharge: 2016-11-04 | Payer: MEDICARE

## 2016-11-03 DIAGNOSIS — M4807 Spinal stenosis, lumbosacral region: Principal | ICD-10-CM

## 2016-11-03 DIAGNOSIS — M961 Postlaminectomy syndrome, not elsewhere classified: ICD-10-CM

## 2016-11-06 ENCOUNTER — Encounter: Admit: 2016-11-06 | Discharge: 2016-11-07 | Payer: MEDICARE

## 2016-11-07 ENCOUNTER — Encounter: Admit: 2016-11-07 | Discharge: 2016-11-07 | Payer: MEDICARE

## 2016-11-07 DIAGNOSIS — I255 Ischemic cardiomyopathy: Secondary | ICD-10-CM

## 2016-11-07 DIAGNOSIS — J9690 Respiratory failure, unspecified, unspecified whether with hypoxia or hypercapnia: ICD-10-CM

## 2016-11-07 DIAGNOSIS — A419 Sepsis, unspecified organism: Principal | ICD-10-CM

## 2016-11-07 LAB — BLOOD GASES, ARTERIAL
Lab: 24 MMOL/L (ref 21–28)
Lab: 7.4 (ref 7.35–7.45)

## 2016-11-07 LAB — COMPREHENSIVE METABOLIC PANEL
Lab: 135 MMOL/L — ABNORMAL LOW (ref 137–147)
Lab: 4.3 MMOL/L (ref 3.5–5.1)

## 2016-11-07 LAB — URINALYSIS, MICROSCOPIC

## 2016-11-07 LAB — URINALYSIS DIPSTICK
Lab: NEGATIVE
Lab: NEGATIVE
Lab: NEGATIVE
Lab: NEGATIVE
Lab: NEGATIVE

## 2016-11-07 LAB — RVP VIRAL PANEL PCR: Lab: DETECTED

## 2016-11-07 LAB — PROCALCITONIN: Lab: 0 ng/mL (ref <0.10)

## 2016-11-07 LAB — CBC AND DIFF
Lab: 0 % — ABNORMAL LOW (ref >60)
Lab: 0 % — ABNORMAL LOW (ref >60)
Lab: 0 10*3/uL (ref 0–0.20)
Lab: 0 10*3/uL (ref 0–0.45)
Lab: 0.5 10*3/uL (ref 0–0.80)
Lab: 0.7 10*3/uL — ABNORMAL LOW (ref 1.0–4.8)
Lab: 105 FL — ABNORMAL HIGH (ref 80–100)
Lab: 13 g/dL — ABNORMAL LOW (ref 13.5–16.5)
Lab: 17 % — ABNORMAL HIGH (ref 11–15)
Lab: 256 10*3/uL — ABNORMAL LOW (ref 3.6–5.2)
Lab: 3.7 M/UL — ABNORMAL LOW (ref 4.4–5.5)
Lab: 33 g/dL (ref 32.0–36.0)
Lab: 35 pg — ABNORMAL HIGH (ref 26–34)
Lab: 39 % — ABNORMAL LOW (ref 40–50)
Lab: 5 % — ABNORMAL HIGH (ref 4–12)
Lab: 8 % — ABNORMAL LOW (ref 24–44)
Lab: 8.3 10*3/uL — ABNORMAL HIGH (ref 1.8–7.0)
Lab: 8.5 FL (ref 7–11)
Lab: 87 % — ABNORMAL HIGH (ref 41–77)
Lab: 9.6 K/UL (ref 4.5–11.0)

## 2016-11-07 LAB — PHOSPHORUS: Lab: 3.7 mg/dL (ref 2.0–4.0)

## 2016-11-07 LAB — TROPONIN-I
Lab: 0 ng/mL (ref 0.0–0.05)
Lab: 0 ng/mL (ref 0.0–0.05)

## 2016-11-07 LAB — POC GLUCOSE
Lab: 195 mg/dL — ABNORMAL HIGH (ref 70–100)
Lab: 193 mg/dL — ABNORMAL HIGH (ref 70–100)

## 2016-11-07 LAB — LACTIC ACID (BG - RAPID LACTATE): Lab: 3.1 MMOL/L — ABNORMAL HIGH (ref 0.5–2.0)

## 2016-11-07 LAB — BNP (B-TYPE NATRIURETIC PEPTI): Lab: 92 pg/mL — ABNORMAL HIGH (ref 0–100)

## 2016-11-07 LAB — MAGNESIUM: Lab: 2.5 mg/dL (ref 1.6–2.6)

## 2016-11-07 MED ORDER — SODIUM PHOSPHATE IVPB
8 MMOL | INTRAVENOUS | 0 refills | Status: DC | PRN
Start: 2016-11-07 — End: 2016-11-12

## 2016-11-07 MED ORDER — CALCIUM GLUCONATE 1GM/100ML NS MB+
1 g | INTRAVENOUS | 0 refills | Status: DC | PRN
Start: 2016-11-07 — End: 2016-11-12

## 2016-11-07 MED ORDER — ONDANSETRON HCL 4 MG PO TAB
4 mg | ORAL | 0 refills | Status: DC | PRN
Start: 2016-11-07 — End: 2016-11-16
  Administered 2016-11-08: 14:00:00 4 mg via ORAL

## 2016-11-07 MED ORDER — METOPROLOL SUCCINATE 25 MG PO TB24
25 mg | Freq: Every day | ORAL | 0 refills | Status: DC
Start: 2016-11-07 — End: 2016-11-16
  Administered 2016-11-08 – 2016-11-16 (×8): 25 mg via ORAL

## 2016-11-07 MED ORDER — POTASSIUM CHLORIDE 20 MEQ/15 ML PO LIQD
40-60 meq | NASOGASTRIC | 0 refills | Status: DC | PRN
Start: 2016-11-07 — End: 2016-11-12

## 2016-11-07 MED ORDER — LIDOCAINE 5 % TP PTMD
1 | TOPICAL | 0 refills | Status: DC
Start: 2016-11-07 — End: 2016-11-10
  Administered 2016-11-07: 21:00:00 1 via TOPICAL

## 2016-11-07 MED ORDER — POTASSIUM CHLORIDE 20 MEQ PO TBTQ
40-60 meq | ORAL | 0 refills | Status: DC | PRN
Start: 2016-11-07 — End: 2016-11-12
  Administered 2016-11-09: 12:00:00 40 meq via ORAL

## 2016-11-07 MED ORDER — ASCORBIC ACID (VITAMIN C) 500 MG PO TAB
500 mg | Freq: Every day | ORAL | 0 refills | Status: DC
Start: 2016-11-07 — End: 2016-11-16
  Administered 2016-11-08 – 2016-11-16 (×9): 500 mg via ORAL

## 2016-11-07 MED ORDER — ASPIRIN 325 MG PO TAB
325 mg | Freq: Every day | ORAL | 0 refills | Status: DC
Start: 2016-11-07 — End: 2016-11-16
  Administered 2016-11-08 – 2016-11-16 (×9): 325 mg via ORAL

## 2016-11-07 MED ORDER — FUROSEMIDE 10 MG/ML IJ SOLN
40 mg | Freq: Once | INTRAVENOUS | 0 refills | Status: CP
Start: 2016-11-07 — End: ?
  Administered 2016-11-07: 23:00:00 40 mg via INTRAVENOUS

## 2016-11-07 MED ORDER — CETIRIZINE 10 MG PO TAB
10 mg | Freq: Every morning | ORAL | 0 refills | Status: DC
Start: 2016-11-07 — End: 2016-11-16
  Administered 2016-11-08 – 2016-11-16 (×9): 10 mg via ORAL

## 2016-11-07 MED ORDER — ATORVASTATIN 20 MG PO TAB
20 mg | Freq: Every day | ORAL | 0 refills | Status: DC
Start: 2016-11-07 — End: 2016-11-16
  Administered 2016-11-08 – 2016-11-16 (×8): 20 mg via ORAL

## 2016-11-07 MED ORDER — IPRATROPIUM BROMIDE 0.02 % IN SOLN
0.5 mg | RESPIRATORY_TRACT | 0 refills | Status: DC | PRN
Start: 2016-11-07 — End: 2016-11-07
  Administered 2016-11-07: 22:00:00 0.5 mg via RESPIRATORY_TRACT

## 2016-11-07 MED ORDER — ACETAMINOPHEN 325 MG PO TAB
650 mg | ORAL | 0 refills | Status: DC | PRN
Start: 2016-11-07 — End: 2016-11-16
  Administered 2016-11-09 – 2016-11-11 (×2): 650 mg via ORAL

## 2016-11-07 MED ORDER — DULOXETINE 60 MG PO CPDR
60 mg | Freq: Every day | ORAL | 0 refills | Status: DC
Start: 2016-11-07 — End: 2016-11-16
  Administered 2016-11-08 – 2016-11-16 (×9): 60 mg via ORAL

## 2016-11-07 MED ORDER — AMITRIPTYLINE(#)/GABAPENTIN/EMU OIL 4/4/10%IN HRT TOPICAL CRM 50G
5 g | Freq: Three times a day (TID) | TOPICAL | 0 refills | Status: DC
Start: 2016-11-07 — End: 2016-11-16
  Administered 2016-11-07 – 2016-11-15 (×3): 5 g via TOPICAL

## 2016-11-07 MED ORDER — VANCOMYCIN PHARMACY TO MANAGE
1 | 0 refills | Status: DC
Start: 2016-11-07 — End: 2016-11-07

## 2016-11-07 MED ORDER — IPRATROPIUM BROMIDE 0.02 % IN SOLN
0.5 mg | Freq: Four times a day (QID) | RESPIRATORY_TRACT | 0 refills | Status: DC | PRN
Start: 2016-11-07 — End: 2016-11-10
  Administered 2016-11-08 – 2016-11-10 (×10): 0.5 mg via RESPIRATORY_TRACT

## 2016-11-07 MED ORDER — ALBUTEROL SULFATE 2.5 MG/0.5 ML IN NEBU
2.5 mg | RESPIRATORY_TRACT | 0 refills | Status: DC | PRN
Start: 2016-11-07 — End: 2016-11-07
  Administered 2016-11-07: 22:00:00 2.5 mg via RESPIRATORY_TRACT

## 2016-11-07 MED ORDER — ALBUTEROL SULFATE 2.5 MG/0.5 ML IN NEBU
2.5 mg | Freq: Four times a day (QID) | RESPIRATORY_TRACT | 0 refills | Status: DC | PRN
Start: 2016-11-07 — End: 2016-11-10
  Administered 2016-11-08 – 2016-11-10 (×11): 2.5 mg via RESPIRATORY_TRACT

## 2016-11-07 MED ORDER — SENNOSIDES-DOCUSATE SODIUM 8.6-50 MG PO TAB
1 | Freq: Two times a day (BID) | ORAL | 0 refills | Status: DC
Start: 2016-11-07 — End: 2016-11-16
  Administered 2016-11-08 – 2016-11-16 (×16): 1 via ORAL

## 2016-11-07 MED ORDER — BENZONATATE 100 MG PO CAP
100 mg | Freq: Three times a day (TID) | ORAL | 0 refills | Status: DC | PRN
Start: 2016-11-07 — End: 2016-11-16
  Administered 2016-11-09 – 2016-11-10 (×5): 100 mg via ORAL

## 2016-11-07 MED ORDER — PIPERACILLIN-TAZOBACTAM-DEXTRS 4.5 GRAM/100 ML IV PGBK
4.5 g | INTRAVENOUS | 0 refills | Status: DC
Start: 2016-11-07 — End: 2016-11-08
  Administered 2016-11-07 – 2016-11-08 (×3): 4.5 g via INTRAVENOUS

## 2016-11-07 MED ORDER — LEVOFLOXACIN IN D5W 750 MG/150 ML IV PGBK
750 mg | INTRAVENOUS | 0 refills | Status: DC
Start: 2016-11-07 — End: 2016-11-07

## 2016-11-07 MED ORDER — BUDESONIDE-FORMOTEROL 160-4.5 MCG/ACTUATION IN HFAA
2 | Freq: Two times a day (BID) | RESPIRATORY_TRACT | 0 refills | Status: DC
Start: 2016-11-07 — End: 2016-11-16
  Administered 2016-11-08: 02:00:00 2 via RESPIRATORY_TRACT

## 2016-11-07 MED ORDER — CHOLECALCIFEROL (VITAMIN D3) 1,000 UNIT PO TAB
1000 [IU] | Freq: Every day | ORAL | 0 refills | Status: DC
Start: 2016-11-07 — End: 2016-11-16
  Administered 2016-11-08 – 2016-11-16 (×9): 1000 [IU] via ORAL

## 2016-11-07 MED ORDER — INSULIN ASPART 100 UNIT/ML SC FLEXPEN
0-7 [IU] | Freq: Before meals | SUBCUTANEOUS | 0 refills | Status: DC
Start: 2016-11-07 — End: 2016-11-08
  Administered 2016-11-07: 23:00:00 2 [IU] via SUBCUTANEOUS

## 2016-11-07 MED ORDER — CODEINE-GUAIFENESIN 10-100 MG/5 ML PO LIQD
5 mL | ORAL | 0 refills | Status: DC | PRN
Start: 2016-11-07 — End: 2016-11-16
  Administered 2016-11-09 – 2016-11-10 (×4): 5 mL via ORAL

## 2016-11-07 MED ORDER — MELATONIN 3 MG PO TAB
3 mg | Freq: Every evening | ORAL | 0 refills | Status: DC
Start: 2016-11-07 — End: 2016-11-16
  Administered 2016-11-08 – 2016-11-16 (×9): 3 mg via ORAL

## 2016-11-07 MED ORDER — BUMETANIDE 0.5 MG PO TAB
2 mg | Freq: Two times a day (BID) | ORAL | 0 refills | Status: DC
Start: 2016-11-07 — End: 2016-11-07

## 2016-11-07 MED ORDER — VANCOMYCIN 2,000 MG IVPB
2000 mg | Freq: Two times a day (BID) | INTRAVENOUS | 0 refills | Status: DC
Start: 2016-11-07 — End: 2016-11-07

## 2016-11-07 MED ORDER — ENOXAPARIN 40 MG/0.4 ML SC SYRG
40 mg | Freq: Two times a day (BID) | SUBCUTANEOUS | 0 refills | Status: DC
Start: 2016-11-07 — End: 2016-11-16
  Administered 2016-11-08 – 2016-11-16 (×17): 40 mg via SUBCUTANEOUS

## 2016-11-07 MED ORDER — IPRATROPIUM-ALBUTEROL 0.5 MG-3 MG(2.5 MG BASE)/3 ML IN NEBU
3 mL | RESPIRATORY_TRACT | 0 refills | Status: DC | PRN
Start: 2016-11-07 — End: 2016-11-07

## 2016-11-07 MED ORDER — ALLOPURINOL 300 MG PO TAB
300 mg | Freq: Every day | ORAL | 0 refills | Status: DC
Start: 2016-11-07 — End: 2016-11-16
  Administered 2016-11-08 – 2016-11-16 (×9): 300 mg via ORAL

## 2016-11-07 MED ORDER — GABAPENTIN 400 MG PO CAP
400 mg | ORAL | 0 refills | Status: DC
Start: 2016-11-07 — End: 2016-11-16
  Administered 2016-11-08 – 2016-11-16 (×26): 400 mg via ORAL

## 2016-11-07 MED ORDER — FAMOTIDINE 20 MG PO TAB
20 mg | Freq: Two times a day (BID) | ORAL | 0 refills | Status: DC
Start: 2016-11-07 — End: 2016-11-08
  Administered 2016-11-08 (×2): 20 mg via ORAL

## 2016-11-07 MED ORDER — METHYLPREDNISOLONE SOD SUC(PF) 125 MG/2 ML IJ SOLR
125 mg | INTRAVENOUS | 0 refills | Status: DC
Start: 2016-11-07 — End: 2016-11-10
  Administered 2016-11-07 – 2016-11-10 (×9): 125 mg via INTRAVENOUS

## 2016-11-07 MED ORDER — PANTOPRAZOLE 40 MG PO TBEC
40 mg | Freq: Two times a day (BID) | ORAL | 0 refills | Status: DC
Start: 2016-11-07 — End: 2016-11-16
  Administered 2016-11-08 – 2016-11-16 (×16): 40 mg via ORAL

## 2016-11-07 MED ORDER — PERFLUTREN LIPID MICROSPHERES 1.1 MG/ML IV SUSP
1-20 mL | Freq: Once | INTRAVENOUS | 0 refills | Status: CP
Start: 2016-11-07 — End: ?
  Administered 2016-11-07: 20:00:00 3 mL via INTRAVENOUS

## 2016-11-07 MED ORDER — DOXYCYCLINE 100MG IVPB
100 mg | Freq: Two times a day (BID) | INTRAVENOUS | 0 refills | Status: DC
Start: 2016-11-07 — End: 2016-11-08
  Administered 2016-11-07 – 2016-11-08 (×4): 100 mg via INTRAVENOUS

## 2016-11-07 MED ORDER — MAGNESIUM SULFATE IN D5W 1 GRAM/100 ML IV PGBK
1 g | INTRAVENOUS | 0 refills | Status: DC | PRN
Start: 2016-11-07 — End: 2016-11-12

## 2016-11-07 NOTE — Progress Notes
RT Adult Assessment Note    NAME:Ruben Wood.             MRN: [id number]             DOB:[birth year]          AGE: 76 y.o.  ADMISSION DATE: 11/07/2016             DAYS ADMITTED: LOS: 0 days    RT Treatment Plan:  Protocol Plan: Medications  Albuterol/Ipratropium: Neb Q4h While Awake & PRN    Protocol Plan: Procedures  Oxygen/Humidity: O2 to keep SpO2 > 92%  Monitoring: Pulse oximetry BID & PRN  Comment: Symbicort BID    Additional Comments:  Impressions of the patient: alert   Intervention(s)/outcome(s): medication as ordered, CPAP at noc.   Patient education that was completed: none  Recommendations to the care team: none    Vital Signs:  Pulse: Pulse: 84  RR: Respirations: 25 PER MINUTE  SpO2: SpO2: 92 %  O2 Device:  High Flow nasal canula  Liter Flow: O2 Liter Flow: 12 lpm  O2%:    Breath Sounds:    Respiratory Effort:

## 2016-11-08 DIAGNOSIS — A419 Sepsis, unspecified organism: Principal | ICD-10-CM

## 2016-11-08 LAB — GRAM STAIN

## 2016-11-08 LAB — CBC AND DIFF: Lab: 8 K/UL — ABNORMAL HIGH (ref 4.5–11.0)

## 2016-11-08 LAB — POC GLUCOSE
Lab: 177 mg/dL — ABNORMAL HIGH (ref 70–100)
Lab: 189 mg/dL — ABNORMAL HIGH (ref 70–100)
Lab: 236 mg/dL — ABNORMAL HIGH (ref 70–100)
Lab: 240 mg/dL — ABNORMAL HIGH (ref 70–100)

## 2016-11-08 LAB — COMPREHENSIVE METABOLIC PANEL: Lab: 134 MMOL/L — ABNORMAL LOW (ref 137–147)

## 2016-11-08 LAB — PHOSPHORUS: Lab: 3.3 mg/dL — ABNORMAL LOW (ref 2.0–4.5)

## 2016-11-08 LAB — MAGNESIUM: Lab: 2.5 mg/dL — ABNORMAL LOW (ref 1.6–2.6)

## 2016-11-08 MED ORDER — FUROSEMIDE 10 MG/ML IJ SOLN
40 mg | Freq: Once | INTRAVENOUS | 0 refills | Status: CP
Start: 2016-11-08 — End: ?
  Administered 2016-11-08: 17:00:00 40 mg via INTRAVENOUS

## 2016-11-08 MED ORDER — PERFLUTREN LIPID MICROSPHERES 1.1 MG/ML IV SUSP
1-20 mL | Freq: Once | INTRAVENOUS | 0 refills | Status: CP
Start: 2016-11-08 — End: ?
  Administered 2016-11-08: 15:00:00 2 mL via INTRAVENOUS

## 2016-11-08 MED ORDER — INSULIN ASPART 100 UNIT/ML SC FLEXPEN
0-14 [IU] | Freq: Before meals | SUBCUTANEOUS | 0 refills | Status: DC
Start: 2016-11-08 — End: 2016-11-09

## 2016-11-08 MED ORDER — AMOXICILLIN-POT CLAVULANATE 875-125 MG PO TAB
875 mg | Freq: Two times a day (BID) | ORAL | 0 refills | Status: CP
Start: 2016-11-08 — End: ?
  Administered 2016-11-08 – 2016-11-14 (×14): 875 mg via ORAL

## 2016-11-08 NOTE — Case Management (ED)
[facility] Case Management Note    Patient was seen today by [facility] with a brief introduction and business card provided. Patient verbalized understanding that the Hospital to Home Program begins after discharge and includes a series of calls to monitor for further post discharge needs and readmission prevention. Outpatient NCM will continue to monitor from afar until patient is discharged.      , RN, BSN  [phone]

## 2016-11-08 NOTE — Case Management (ED)
Case Management Admission Assessment    NAME:Ruben Wood.                          MRN: [id number]             DOB:Sep 16, 1940          AGE: 76 y.o.  ADMISSION DATE: 11/07/2016             DAYS ADMITTED: LOS: 1 day      Today???s Date: 11/08/2016    Source of Information: This NCM introduced self and explained role of CM. Patient resting in bed and able to answer all questions.    Plan  Plan: CM Assessment, Assist PRN with SW/NCM Services, Discharge Planning for Home Anticipated     ??? Infectious work-up  ??? Wean NIV as tolerated  ??? DC planning on going  ??? Continue ICU care      Interventions  ? Support    Patient lives at home with Wife and was independent prior to admission.  ? Info or Referral      ? Discharge Planning      ? Medication Needs      ? Financial      ? Legal      ? Other        Disposition  ? Expected Discharge Date    Expected Discharge Date: 11/11/16  ? Transportation   Does the patient need discharge transport arranged?: No  Transportation Name, Phone and Availability #1: [name] - wife [phone]  Does the patient use Medicaid Transportation?: No  ? Next Level of Care (Acute Psych discharges only)      ? Discharge Disposition                                          Durable Medical Equipment     No service has been selected for the patient.      [facility]     No service has been selected for the patient.      [facility]     No service has been selected for the patient.      [facility]     No service has been selected for the patient.            Patient Address/Phone  [address]  [place] [address]  [phone] (home)     Emergency Contact  Extended Emergency Contact Information  Primary Emergency Contact: Moore,[name]  Address: [address] [place], [place] [address] [place]  Home Phone: [phone]  Relation: Spouse  Secondary Emergency Contact: Lorna [place]  Home Phone: [phone]  Mobile Phone: [phone]  Relation: Relative Healthcare Directive         Transportation  Does the patient need discharge transport arranged?: No  Transportation Name, Phone and Availability #1: [name] - wife [phone]  Does the patient use Medicaid Transportation?: No    Expected Discharge Date  Expected Discharge Date: 11/11/16    Living Situation Prior to Admission  ? Living Arrangements  Type of Residence: Home, independent  Living Arrangements: Spouse/significant other  Financial risk analyst / Tub: Psychologist, counselling  How many levels in the residence?: 1  Can patient live on one level if needed?: N/A  Does residence  have entry and/or side stairs?: Yes  Assistance needed prior to admit or anticipated on discharge: No  Who provides assistance or could if needed?: Spouse  Are they in good health?: Yes  Can support system provide 24/7 care if needed?: Maybe  ? Level of Function   Prior level of function: Independent  ? Cognitive Abilities   Cognitive Abilities: Alert and Oriented, Participates in decision making    Financial Resources  ? Coverage  Primary Insurance: Medicare  Secondary Insurance: Medicare Supplement  Additional Coverage: RX    ? Source of Income   Source Of Income: SSI  ? Financial Assistance Needed?  no    Psychosocial Needs  ? Mental Health     ? Substance Use History     ? Other  no    Current/Previous Services  ? PCP  [facility], [name], [phone], [phone]  ? Pharmacy    [facility] [address], [place] - [address]  [address]  [place] [address]  Phone: [phone] Fax: [phone]    [facility] [address] [place] - [address]  [address] [place] [address]  Phone: [phone] Fax: [phone]    [facility] - [place] - [address]  [address]  [place] [address]  Phone: [phone] Fax: [phone]    ? Durable Psychologist, educational at home: CPAP/BiPAP, Oxygen, Shower Chair, Nurse, adult, Single [employer], [employer]  ? Home Health Receiving home health: In the past  ? Hemodialysis or Peritoneal Dialysis  Undergoing hemodialysis or peritoneal dialysis: No  ? Tube/Enteral Feeds  Receive tube/enteral feeds: No  ? Infusion  Receive infusions: No  ? Private Duty  Private duty help used: No  ? Home and Community Based Services  Home and community based services: No  ? [employer]: N/A  ? Hospice  Hospice: No  ? Outpatient Therapy  PT: No  OT: No  SLP: No  ? Skilled Nursing Facility/Nursing Home  SNF: No  NH: No  ? Inpatient Rehab  IPR: No  ? Long-Term Acute Care Hospital  LTACH: No  ? Acute Hospital Stay  Acute Hospital Stay: No    Lenon Ahmadi RN, BSN  Nurse Case Manager  [phone] or [phone]

## 2016-11-08 NOTE — Progress Notes
~  1505: Report received and bedside safety check completed with Tarin, patient's night RN. Day RN assumed pt care at this time. Pt sitting up in chair. No immediate concerns to report. VSS per pt.    ~1600: Patient (pt) assessment completed - please see doc flowsheet for details. Pt plan of care reviewed and updated. Vital signs stable and within desired limits per pt trends. Pt alert and oriented to person, place, time, and situation. Denies pain, nausea, and/or vomiting. Pt sitting up in chair. Appears comfortable. No additional concerns for pt at this time. Will continue to monitor pt status closely.    ~1800: Pt sitting up in chair for dinner. Appears comfortable. No concerns at this time.

## 2016-11-08 NOTE — H&P (View-Only)
ICU Attending Note    [name].  Is critically ill with:    Active Problems:    Acute respiratory failure with hypoxia (HCC)    Severe sepsis (HCC)    Lactic acid acidosis    Hyperglycemia      I spent 80 minutes (excluding time spent performing or supervising any procedures) providing and personally directing critical care services which was discussed with the ICU team including:     Systems review and physical examination   Serial evaluation of hemodynamic data   Serial evaluation of respiratory status   Review and management of ICU prophylaxis and core measures   Review of laboratory data   Review of telemetry data   Review of imaging studies   Review of medications   Fluid and electrolyte management  NIPPVentilator management  Coordination of care between consulting services      Staff name:  Cloretta Ned, MD Date:  11/07/2016

## 2016-11-08 NOTE — Progress Notes
Pulmonary / Critical Care Progress Note    Ruben Wood.  Today's Date:  11/08/2016  Admission Date: 11/07/2016  LOS: 1 day    Active Problems:    Acute respiratory failure with hypoxia (HCC)    Severe sepsis (HCC)    Lactic acid acidosis    Hyperglycemia    Brief Hospital Course:      Nachum Behr. is a 76 y/o male w/ PMH significant for depression, chronic pain, asthma, CKD, HTN, HLD, CAD, ischemic cardiomyopathy s/p pacer, ICD & cardioMEMS implant. He presentsed 9/18 in transfer from [facility] w/ acute respiratory failure w/ hypoxia. Presented there 9/13 w/ persistent cough causing SOA & was treated w/ levaquin & steroid. On 9/18 had progressive increase in O2 requirement & placed on BiPAP prior to transfer. RVP + for human rhinovirus and sputum gram stain with many GPC resembling strep (despite being on levaquin). Have narrowed antbx to augmentin. Remains on 12 L HFNC with significant cough; will continue methylprednisolone for now. Intermittent diuresis with IV lasix.    Assessment/Plan:      NEURO  Depression  - continue PTA cymbalta   Chronic Low Back Pain  - follows at [facility]  - 2/2 falling off roof; has had epidural injections in the past  PLAN  - continue PTA amitriptyline/gabapentin/emu oil, gabapentin, & lidoderm patches  - holding PTA tylenol  3, oxycodone, tizanidine    PULM  Acute Respiratory Failure w/ Hypoxia; Cough  Hx Allergic Asthma; Bronchiectasis   - 2/2 viral/bacterial infection vs asthma exacerbation vs bronchitis vs volume overload (w/ recent decrease in diuretic dosing ~8/21)  - follows w/ Dr. 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 percardial fat  - this AM requiring 12 L HFNC  PLAN  - Can trial off BiPAP & wean O2 as tolerated  - lasix 40 mg IV x 1 on 9/18 --> net - 1 L  - lasix 40 mg IV x 1 this AM, then re-evaluate  - continue methylpred 125 mg Q 8 hrs  - continue PTA duonebs & symbicort  - continue PTA tessalon perles, zyrtec, & guaifenesin w/ codeine  - ID workup & tx as below  OSA  - CPAP Q HS    CV  Combined Systolic & Diastolic Heart Failure; HTN, HLD  CAD & Ischemic Cardiomyopathy s/p Pacemaker, CRT-D  - CardioMEMS read on 9/17 PAP 42/20 (baseline appears 20s over ~10)  - echo 05/04/16: LVEF 40% (had been as  low as 20%), RV w/ borderline low EF, LA mildly dilated, RA normal; mild to mod MR, mild TR  - of note, bumex had been increased in June & then reduced from 4 mg BID to 2 mg BID on 8/21 due to elevations in BUN/Cr  - BNP 92  - EKG paced rhythm, rate 90s, trop 0.04  - repeat echo 9/18: LVEF 45-50%; RV normal function; mild to mod MR; no pericardial effusion  - CardioMEMS reading 36/16 today  - HR 70-90s paced, SBP 120s-140s  PLAN  - limited echo w/ bubble study today  - continue PTA lipitor & ASA  - continue PTA metoprolol XL  - holding PTA bumex and spironolactone w/ IV diuresis as above  AAA  - s/p endovascular repair  - follows up with Dr. Chales Abrahams    GI  - last BM PTA  - continue PTA vit C, vit D3  - continue PTA senna/colace BID  GERD  - continue PTA protonix & pepcid    RENAL  CKD  - baseline Cr variable - appears ~1.4  - Cr 2.07 this AM  - I/O: net - 1 L since admit  - UOP 2.5 L  PLAN  - monitor w/ diuresis ENDO  Hyperglycemia  - in setting of steroid  - glucose 195 on admit; BS 180s-240s since admit  - increase to mid dose correction factor    ID  Severe Sepsis, Rhinovirus  - on admit had 2/4 SIRS (mild tachycardia/tachypnea), no fever or leukocytosis but w/ elevated LA   - has had waxing/waning cough for the last month - hx allergic rhinitis & asthma but not improved -> had been given a z-pak 8/21 & then started on doxy 9/10 which he continued till admission to OSH 9/13  - at OSH, he received levaquin 9/13, and then daily starting 9/16  - UA w/o evidence of infection  - LA 3.1  - procal 0.08  - RVP: + for rhinovirus  - blood cx 9/18: no growth at 1 day  - sputum cx 9/18: GS with many GPC resembling strep; cx pending  PLAN  - started doxy and zosyn on admit here --> change to augmentin PO    HEME  Iron Deficiency Anemia  - iron studies 8/17: Iron 111, % sat low 22, TIBC high 499, ferritin 38  - s/p IV iron infusion 8/21  - hgb 12.1  PLAN  - continue PTA Iron    FEN  - cardiac diet  - IVF: none  - review electrolytes and replace as needed    PPX:  Lines: PIV x 3  Drains/Tubes: None  Indwelling Urinary Catheter: No  DVT: SCDs; Lovenox  GI: Pepcid, Protonix  PT/OT: Yes  Insulin: Yes  Reviewed Quality Checklist with RN: Yes    Code Status: Full Code  Disposition/Family: Continue ICU care.     99291 x 1 - pt critically ill with above diagnoses. I spent 50 minutes providing critical care services including:  ??? performing a physical examination  ??? serially reviewing laboratory, telemetry,  hemodynamic, oximetry, and respiratory data  ??? reviewing radiographic images  ??? reviewing medications  ??? managing fluids/electrolytes, antibiotics, ICU prophylaxis, and noninvasive ventilation/high flow oxygen   ??? developing the overall plan of care.    Scheryl Darter, APRN  Pulm/Critical Care  Pager [phone]  M2 (2nd call/night) Pager [phone]  11/08/2016 1350  __________________________________________________________________________________ Subjective:     Ruben Wood. is a 76 y.o. male who is  awake and alert, sitting on side of bed. Continues to have significant cough and sore throat. Runny nose and PND have improved. SOB stable.    ROS: Denies HA, chest pain/pressure, abdominal pain, N/V/D/C, or rash.    Objective:     Medications:  Scheduled Meds:  albuterol 0.5% (PROVENTIL; VENTOLIN) nebulizer solution 2.5 mg 2.5 mg Inhalation QID & PRN   allopurinol (ZYLOPRIM) tablet 300 mg 300 mg Oral QDAY   amitriptyline/gabapentin/emu oil(#) 05/24/08 % topical cream 5 g 5 g Topical TID   ascorbic acid (VITAMIN C) tablet 500 mg 500 mg Oral QDAY   aspirin tablet 325 mg 325 mg Oral QDAY   atorvastatin (LIPITOR) tablet 20 mg 20 mg Oral QDAY   budesonide/formoterol (SYMBICORT HFA) 160/4.5 mcg inhalation 2 puff 2 puff Inhalation BID   cetirizine (ZYRTEC) tablet 10 mg 10 mg Oral QAM8   cholecalciferol (VITAMIN D-3) tablet 1,000 Units 1,000 Units Oral QDAY   doxycycline (VIBRAMYCIN) 100 mg in dextrose 5% (D5W) 100 mL IVPB 100 mg Intravenous Q12H*   duloxetine DR (CYMBALTA) capsule 60 mg 60 mg Oral QDAY   enoxaparin (LOVENOX) syringe 40 mg 40 mg Subcutaneous BID   famotidine (PEPCID) tablet 20 mg 20 mg Oral BID   gabapentin (NEURONTIN) capsule 400 mg 400 mg Oral Q8H*   insulin aspart U-100 (NOVOLOG FLEXPEN) injection PEN 0-7 Units 0-7 Units Subcutaneous ACHS   ipratropium bromide (ATROVENT) 0.02 % nebulizer solution 0.5 mg 0.5 mg Inhalation QID & PRN   lidocaine (LIDODERM) 5 % topical patch 1 patch 1 patch Topical Q24H*   melatonin tablet 3 mg 3 mg Oral QHS   methylPREDNISolone (SOLU-MEDROL PF) injection 125 mg 125 mg Intravenous Q8H   metoprolol XL (TOPROL XL) tablet 25 mg 25 mg Oral QDAY   pantoprazole DR (PROTONIX) tablet 40 mg 40 mg Oral BID   piperacillin/tazobactam  (ZOSYN) 4.5 g/100 mL iso-osmotic IVPB 4.5 g Intravenous Q6H*   senna/docusate (SENOKOT-S) tablet 1 tablet 1 tablet Oral BID   Continuous Infusions: PRN and Respiratory Meds:acetaminophen Q6H PRN, benzonatate TID PRN, calcium gluconate  IV PRN (On Call from Rx) **AND** Ionized Calcium PRN **AND** Notify Physician Ongoing, codeine/guaiFENesin Q4H PRN, magnesium sulfate PRN **AND** Magnesium PRN **AND** Notify Physician Ongoing, ondansetron Q6H PRN, potassium chloride SR PRN **OR** potassium chloride PRN, sodium phosphate  IVPB PRN (On Call from Rx) **AND** Phosphorus PRN **AND** Notify Physician Ongoing                       Vital Signs: Last Filed                  Vital Signs: 24 Hour Range   BP: 129/62 (09/19 0500)  Temp: 36.6 ???C (97.8 ???F) (09/18 2300)  Pulse: 71 (09/19 0500)  Respirations: 15 PER MINUTE (09/19 0500)  SpO2: 94 % (09/19 0500)  O2 Delivery: Non Invasive Ventilation (CPAP) (09/19 0500)  SpO2 Pulse: 71 (09/19 0500)  Height: 180.3 cm (71) (09/18 1526) BP: (109-163)/(60-97)   Temp:  [36.4 ???C (97.5 ???F)-36.9 ???C (98.4 ???F)]   Pulse:  [71-105]   Respirations:  [15 PER MINUTE-30 PER MINUTE]   SpO2:  [89 %-99 %]   O2 Delivery: Non Invasive Ventilation (CPAP)     Vitals:    11/07/16 1225 11/07/16 1526   Weight: 136.1 kg (300 lb) (!) 136.1 kg (300 lb 0.7 oz)           Intake/Output Summary:  (Last 24 hours)    Intake/Output  Summary (Last 24 hours) at 11/08/16 0604  Last data filed at 11/08/16 0500   Gross per 24 hour   Intake             1430 ml   Output             2525 ml   Net            -1095 ml         Physical Exam:      General: alert, cooperative, obese  Head: normocephalic, without obvious abnormality, atraumatic  Eyes: conjunctivae/corneas clear, PERRL  Throat: lips, mucosa moist   Neck: symmetrical, trachea midline  Lungs: coarse to auscultation bilaterally with expiratory wheezes, persistent dry barking cough during exam  Heart: paced, regular rate and rhythm  Abdomen: soft, obese, non-tender; bowel sounds active  Extremities: normal, atraumatic, has 1-2+ BLE edema  Peripheral pulses: 2+ and symmetric, all extremities Skin: BLE venous stasis changes, telangiectasias on upper chest, scattered BUE ecchymosis  Neurologic: alert, oriented, moves all extremities, no focal deficit    Artificial airway:  None                                                                                         Ventilator/ Respiratory Therapy:  NIV vs HFNC    Vent weaning trial:  Not applicable      Laboratory:  LABS:  Recent Labs      11/07/16   1252  11/08/16   0235   NA  135*  134*   K  4.3  4.4   CL  99  99   CO2  22  23   GAP  14*  12   BUN  73*  74*   CR  1.72*  2.07*   GLU  198*  244*   CA  8.7  8.2*   ALBUMIN  4.2  3.5   MG  2.5  2.5   PO4  3.7  3.3       Recent Labs      11/07/16   1252  11/07/16   1603  11/08/16   0235   WBC  9.6   --   8.0   HGB  13.1*   --   12.1*   HCT  39.2*   --   36.2*   PLTCT  256   --   207   AST  17   --   21   ALT  20   --   21   ALKPHOS  72   --   58   TNI  0.04  0.04   --       Estimated Creatinine Clearance: 42.1 mL/min (A) (based on SCr of 2.07 mg/dL (H)).  Vitals:    11/07/16 1225 11/07/16 1526   Weight: 136.1 kg (300 lb) (!) 136.1 kg (300 lb 0.7 oz)    Recent Labs      11/07/16   1305   PHART  7.41   PO2ART  115*                 Radiology and Other Diagnostic  Procedures Review:    Reviewed pertinent studies.

## 2016-11-09 LAB — PHOSPHORUS: Lab: 3.4 mg/dL — ABNORMAL LOW (ref 2.0–4.5)

## 2016-11-09 LAB — CULTURE-RESP,LOWER W/SENSITIVITY: Lab: LOW

## 2016-11-09 LAB — CBC AND DIFF: Lab: 11 K/UL — ABNORMAL HIGH (ref 4.5–11.0)

## 2016-11-09 LAB — POTASSIUM: Lab: 4.2 MMOL/L (ref 3.5–5.1)

## 2016-11-09 LAB — POC GLUCOSE
Lab: 269 mg/dL — ABNORMAL HIGH (ref 70–100)
Lab: 229 mg/dL — ABNORMAL HIGH (ref 70–100)
Lab: 200 mg/dL — ABNORMAL HIGH (ref 70–100)
Lab: 251 mg/dL — ABNORMAL HIGH (ref 70–100)

## 2016-11-09 LAB — COMPREHENSIVE METABOLIC PANEL: Lab: 133 MMOL/L — ABNORMAL LOW (ref >60)

## 2016-11-09 LAB — MAGNESIUM: Lab: 2.7 mg/dL — ABNORMAL HIGH (ref >60)

## 2016-11-09 MED ORDER — POTASSIUM CHLORIDE 20 MEQ PO TBTQ
40 meq | Freq: Once | ORAL | 0 refills | Status: AC
Start: 2016-11-09 — End: ?

## 2016-11-09 MED ORDER — INSULIN ASPART 100 UNIT/ML SC FLEXPEN
0-28 [IU] | Freq: Before meals | SUBCUTANEOUS | 0 refills | Status: DC
Start: 2016-11-09 — End: 2016-11-16
  Administered 2016-11-16: 02:00:00 4 [IU] via SUBCUTANEOUS

## 2016-11-09 MED ORDER — FUROSEMIDE 10 MG/ML IJ SOLN
40 mg | Freq: Once | INTRAVENOUS | 0 refills | Status: CP
Start: 2016-11-09 — End: ?
  Administered 2016-11-09: 13:00:00 40 mg via INTRAVENOUS

## 2016-11-09 NOTE — Progress Notes
ICU Attending Note    [name].  Is critically ill with:    Active Problems:    OSA on CPAP    GERD (gastroesophageal reflux disease)    CAD (coronary artery disease)    AAA (abdominal aortic aneurysm) (HCC)    CKD (chronic kidney disease) stage 3, GFR 30-59 ml/min (HCC)    Chronic combined systolic and diastolic congestive heart failure, NYHA class 2 (HCC)    Chronic obstructive pulmonary disease (HCC)    Ischemic cardiomyopathy    Moderate persistent asthma with acute exacerbation    Iron deficiency    Acute respiratory failure with hypoxia (HCC)    Severe sepsis (HCC)    Lactic acid acidosis    Hyperglycemia    Rhinovirus infection      I spent 40 minutes (excluding time spent performing or supervising any procedures) providing and personally directing critical care services which was discussed with the ICU team including:     Systems review and physical examination   Serial evaluation of hemodynamic data   Serial evaluation of respiratory status   Review and management of ICU prophylaxis and core measures   Review of laboratory data   Review of telemetry data   Review of imaging studies   Review of medications   Fluid and electrolyte management  NIPPVentilator management  Coordination of care between consulting services    Continue high dose solumedrol at 125 q 8 given refractory nature of his bronchospasm.  He feels better but still requiring high flow O2 at 12 LPM.  Slow improvement.  Continue to abx and high dose steroids.  Will reassess tomorrow.      Staff name:  Cloretta Ned, MD Date:  11/09/2016

## 2016-11-09 NOTE — Progress Notes
Daily CardioMEMS Readings    Goal PA Diastolic: 5-10 mmHg  11/09/16 PA Diastolic: 49mmHg--Within goal.     Taken on PA Systolic PA Diastolic PA Mean Heart Rate Notes   11-09-2016, 11:20 AM 22 mmHg 7 mmHg 11 mmHg 74bpm    11-08-2016, 10:40 AM 36 mmHg 16 mmHg 23 mmHg 88 bpm    11-07-2016 --- --- --- --- PA diastolic was on CardioMEMS (pulmonary artery pressure sensor) reading yesterday. Patient did not send CardioMEMS readings 9/13 through 9/16. Called patient this morning for symptom follow and to ask patient to send a CardioMEMS reading this morning. EMT, Greig Castilla, answered patient's phone and stated patient unable to talk on the phone at this time. Greig Castilla reports patient is being transported to [facility] room [phone].   EMT did not disclose reason for admission.    11-06-2016, 06:55 PM 42 mmHg 20 mmHg 28 mmHg 101 bpm    11-01-2016, 11:20 AM 21 mmHg 7 mmHg 12 mmHg 105 bpm    10-31-2016, 10:48 AM 23 mmHg 7 mmHg 13 mmHg 97 bpm    10-30-2016, 12:50 PM 27 mmHg 9 mmHg 17 mmHg 112 bpm    10-29-2016, 11:08 AM 22 mmHg 8 mmHg 13 mmHg 98 bpm    10-27-2016, 09:17 AM 25 mmHg 9 mmHg 14 mmHg 94 bpm    10-26-2016, 06:31 AM 29 mmHg 11 mmHg 17 mmHg 84 bpm    10-25-2016, 06:32 AM 24 mmHg 11 mmHg 15 mmHg 107 bpm    10-24-2016, 10:27 AM 27 mmHg 10 mmHg 16 mmHg 85 bpm    10-22-2016, 07:26 AM 26 mmHg 9 mmHg 15 mmHg 84 bpm    10-21-2016, 03:49 PM 23 mmHg 9 mmHg 14 mmHg 102 bpm    10-20-2016, 09:05 AM 23 mmHg 10 mmHg 14 mmHg 90 bpm    10-19-2016, 06:44 AM 24 mmHg 8 mmHg 14 mmHg 88 bpm    10-18-2016, 06:42 AM 26 mmHg 9 mmHg 16 mmHg 96 bpm    10-17-2016, 08:58 AM 26 mmHg 9 mmHg 15 mmHg 93 bpm    10-16-2016, 11:22 AM 26 mmHg 10 mmHg 15 mmHg 96 bpm    10-15-2016, 06:59 AM 20 mmHg 6 mmHg 11 mmHg 99 bpm    10-14-2016, 07:23 AM 19 mmHg 7 mmHg 11 mmHg 93 bpm    10-13-2016, 08:50 AM 24 mmHg 9 mmHg 14 mmHg 87 bpm    10-12-2016, 10:19 AM 24 mmHg 10 mmHg 15 mmHg 96 bpm    10-11-2016, 06:00 AM 21 mmHg 7 mmHg 12 mmHg 84 bpm 10-10-2016, 07:09 AM 25 mmHg 10 mmHg 15 mmHg 89 bpm    10-09-2016, 06:34 AM 23 mmHg 9 mmHg 14 mmHg 93 bpm    10-08-2016, 07:27 AM 24 mmHg 9 mmHg 14 mmHg 80 bpm    10-07-2016, 08:01 AM 26 mmHg 10 mmHg 15 mmHg 80 bpm    ???  ???  Matthew Folks, RN, Engineer, civil (consulting)  Phone: [phone]  Pager: [phone]

## 2016-11-09 NOTE — Progress Notes
Pulmonary / Critical Care Progress Note    Ruben Kelch.  Today's Date:  11/09/2016  Admission Date: 11/07/2016  LOS: 2 days    Active Problems:    OSA on CPAP    GERD (gastroesophageal reflux disease)    CAD (coronary artery disease)    AAA (abdominal aortic aneurysm) (HCC)    CKD (chronic kidney disease) stage 3, GFR 30-59 ml/min (HCC)    Chronic combined systolic and diastolic congestive heart failure, NYHA class 2 (HCC)    Chronic obstructive pulmonary disease (HCC)    Ischemic cardiomyopathy    Moderate persistent asthma with acute exacerbation    Iron deficiency    Acute respiratory failure with hypoxia (HCC)    Severe sepsis (HCC)    Lactic acid acidosis    Hyperglycemia    Rhinovirus infection    Brief Hospital Course:      Ruben Villaruz. is a 76 y/o male w/ PMH significant for depression, chronic pain, asthma, CKD, HTN, HLD, CAD, ischemic cardiomyopathy s/p pacer, ICD & cardioMEMS implant. He presentsed 9/18 in transfer from [facility] w/ acute respiratory failure w/ hypoxia. Presented there 9/13 w/ persistent cough causing SOA & was treated w/ levaquin & steroid. On 9/18 had progressive increase in O2 requirement & placed on BiPAP prior to transfer. RVP + for human rhinovirus and sputum gram stain with many GPC resembling strep (despite being on levaquin). Have narrowed antbx to augmentin. Remains on 12 L HFNC with significant cough; will continue methylprednisolone for now. Intermittent diuresis with IV lasix.     Assessment/Plan:      NEURO  Depression  - continue PTA cymbalta   Chronic Low Back Pain  - follows at [facility]  - 2/2 falling off roof; has had epidural injections in the past  PLAN  - continue PTA amitriptyline/gabapentin/emu oil, gabapentin, & lidoderm patches  - holding PTA tylenol  3, oxycodone, tizanidine    PULM  Acute Respiratory Failure w/ Hypoxia; Cough  Hx Allergic Asthma; Bronchiectasis   - 2/2 viral/bacterial infection vs asthma exacerbation vs bronchitis vs volume overload (w/ recent decrease in diuretic dosing ~8/21)  - follows w/ Dr. 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 percardial fat  - this AM requiring 12 L HFNC   PLAN  - lasix 40 mg IV x 1 on 9/18 --> net - 1 L  - lasix 40 mg IV x 1 on 9/19 --> net - 3.3 L  - will  repeat lasix 40 mg IV x 1 this AM  - continue methylpred 125 mg Q 8 hrs   - continue PTA duonebs & symbicort  - continue PTA tessalon perles, zyrtec, & guaifenesin w/ codeine  - ID workup & tx as below  OSA  - CPAP Q HS    CV  Combined Systolic & Diastolic Heart Failure; HTN, HLD  CAD & Ischemic Cardiomyopathy s/p Pacemaker, CRT-D  - CardioMEMS read on 9/17 PAP 42/20 (baseline appears 20s over ~10)  - echo 05/04/16: LVEF 40% (had been as low as 20%), RV w/ borderline low EF, LA mildly dilated, RA normal; mild to mod MR, mild TR  - of note, bumex had been increased in June & then reduced from 4 mg BID to 2 mg BID on 8/21 due to elevations in BUN/Cr  - BNP 92  - EKG paced rhythm, rate 90s, trop 0.04  - repeat echo 9/18: LVEF 45-50%; RV normal function; mild to mod MR; no pericardial effusion  - bubble study negative 9/19  - CardioMEMS reading 36/16 on 9/19 --> cardiology will recheck daily - HR 70-80s paced, SBP 120s-140s  PLAN  - continue PTA lipitor & ASA  - continue PTA metoprolol XL  - holding PTA bumex and spironolactone w/ IV diuresis as above  - consider HF team consult  AAA  - s/p endovascular repair  - follows up with Dr. Chales Abrahams    GI  - last BM PTA  - continue PTA vit C, vit D3  - continue PTA senna/colace BID  GERD  - continue PTA protonix & pepcid    RENAL  CKD  - baseline Cr variable - appears ~1.4  - Cr 1.51 this AM  - I/O: net - 3.2 L since admit  - UOP 4 L  PLAN  - monitor w/ diuresis    ENDO  Hyperglycemia  - in setting of steroid  - glucose 195 on admit; BS 170s-260s last 24 hrs  - continue mid dose correction factor    ID  Severe Sepsis, Rhinovirus  - on admit had 2/4 SIRS (mild tachycardia/tachypnea), no fever or leukocytosis but w/ elevated LA   - has had waxing/waning cough for the last month - hx allergic rhinitis & asthma but not improved -> had been given a z-pak 8/21 & then started on doxy 9/10 which he continued till admission to OSH 9/13  - at OSH, he received levaquin 9/13, and then daily starting 9/16  - WBC 11.4 this AM; afebrile  - UA w/o evidence of infection  - LA 3.1  - procal 0.08  - RVP: + for rhinovirus  - blood cx 9/18: no growth at 2 days  - sputum cx 9/18: GS with many GPC resembling strep; cx pending  PLAN  - continue augmentin PO    HEME  Iron Deficiency Anemia  - iron studies 8/17: iron 111, % sat low 22, TIBC high 499, ferritin 38  - s/p IV iron infusion 8/21  - hgb 12.3  PLAN  - continue PTA iron    FEN  - cardiac diet  - IVF: none  - review electrolytes and replace as needed    PPX:  Lines: PIV x 3  Drains/Tubes: None  Indwelling Urinary Catheter: No  DVT: SCDs; Lovenox  GI: Pepcid, Protonix  PT/OT: Yes  Insulin: Yes  Reviewed Quality Checklist with RN: Yes    Code Status: Full Code  Disposition/Family: Continue ICU care.     16109  x 1 - pt critically ill with above diagnoses. I spent 45 minutes providing critical care services including: ??? performing a physical examination  ??? serially reviewing laboratory, telemetry,  hemodynamic, oximetry, and respiratory data  ??? reviewing radiographic images  ??? reviewing medications  ??? managing fluids/electrolytes, antibiotics, ICU prophylaxis, and noninvasive ventilation/high flow oxygen   ??? developing the overall plan of care.    Scheryl Darter, APRN  Pulm/Critical Care  Pager [phone]  M2 (2nd call/night) Pager [phone]  11/09/2016 1319  __________________________________________________________________________________    Subjective:     Ruben Kelch. is a 76 y.o. male who is awake and alert, sitting on side of bed; wife at bedside. Continues to have significant cough and wheezing. Was able to walk ~ 50 ft in hallway with PT. Runny nose and PND have improved. SOB stable.    ROS: Denies HA, chest pain/pressure, abdominal pain, N/V/D/C, or rash.    Objective:     Medications:  Scheduled Meds:    albuterol 0.5% (PROVENTIL; VENTOLIN) nebulizer solution 2.5 mg 2.5 mg Inhalation QID & PRN   allopurinol (ZYLOPRIM) tablet 300 mg 300 mg Oral QDAY   amitriptyline/gabapentin/emu oil(#) 05/24/08 % topical cream 5 g 5 g Topical TID   amoxicillin/K clavulanate (AUGMENTIN) tablet 875 mg 875 mg Oral BID w/meals   ascorbic acid (VITAMIN C) tablet 500 mg 500 mg Oral QDAY   aspirin tablet 325 mg 325 mg Oral QDAY   atorvastatin (LIPITOR) tablet 20 mg 20 mg Oral QDAY   budesonide/formoterol (SYMBICORT HFA) 160/4.5 mcg inhalation 2 puff 2 puff Inhalation BID   cetirizine (ZYRTEC) tablet 10 mg 10 mg Oral QAM8   cholecalciferol (VITAMIN D-3) tablet 1,000 Units 1,000 Units Oral QDAY   duloxetine DR (CYMBALTA) capsule 60 mg 60 mg Oral QDAY   enoxaparin (LOVENOX) syringe 40 mg 40 mg Subcutaneous BID   gabapentin (NEURONTIN) capsule 400 mg 400 mg Oral Q8H*   insulin aspart U-100 (NOVOLOG FLEXPEN) injection PEN 0-14 Units 0-14 Units Subcutaneous ACHS   ipratropium bromide (ATROVENT) 0.02 % nebulizer solution 0.5 mg 0.5 mg Inhalation QID & PRN lidocaine (LIDODERM) 5 % topical patch 1 patch 1 patch Topical Q24H*   melatonin tablet 3 mg 3 mg Oral QHS   methylPREDNISolone (SOLU-MEDROL PF) injection 125 mg 125 mg Intravenous Q8H   metoprolol XL (TOPROL XL) tablet 25 mg 25 mg Oral QDAY   pantoprazole DR (PROTONIX) tablet 40 mg 40 mg Oral BID   potassium chloride SR (K-DUR) tablet 40 mEq 40 mEq Oral ONCE   senna/docusate (SENOKOT-S) tablet 1 tablet 1 tablet Oral BID   Continuous Infusions:  PRN and Respiratory Meds:acetaminophen Q6H PRN, benzonatate TID PRN, calcium gluconate  IV PRN (On Call from Rx) **AND** Ionized Calcium PRN **AND** Notify Physician Ongoing, codeine/guaiFENesin Q4H PRN, magnesium sulfate PRN **AND** Magnesium PRN **AND** Notify Physician Ongoing, ondansetron Q6H PRN, potassium chloride SR PRN **OR** potassium chloride PRN, sodium phosphate  IVPB PRN (On Call from Rx) **AND** Phosphorus PRN **AND** Notify Physician Ongoing                       Vital Signs: Last Filed                  Vital Signs: 24 Hour Range   BP: 139/117 (09/20 1300)  Temp: 36.5 ???C (97.7 ???F) (09/20 0800)  Pulse: 73 (09/20 1307)  Respirations: 20 PER MINUTE (09/20 1307)  SpO2: 92 % (09/20 1307)  O2 Delivery: High Flow  Nasal Cannula (09/20 1300)  SpO2 Pulse: 83 (09/20 1300) BP: (106-162)/(59-117)   Temp:  [36.5 ???C (97.7 ???F)-36.7 ???C (98.1 ???F)]   Pulse:  [71-95]   Respirations:  [14 PER MINUTE-27 PER MINUTE]   SpO2:  [89 %-98 %]   O2 Delivery: High Flow Nasal Cannula   Intensity Pain Scale (Self Report): Asleep (11/09/16 0600) Vitals:    11/07/16 1225 11/07/16 1526 11/08/16 0943   Weight: 136.1 kg (300 lb) (!) 136.1 kg (300 lb 0.7 oz) 136.1 kg (300 lb)           Intake/Output Summary:  (Last 24 hours)    Intake/Output Summary (Last 24 hours) at 11/09/16 1314  Last data filed at 11/09/16 1100   Gross per 24 hour   Intake             1820 ml   Output             4175 ml   Net            -2355 ml         Physical Exam:      General: alert, cooperative, obese Head: normocephalic, without obvious abnormality, atraumatic  Eyes: conjunctivae/corneas clear, PERRL  Throat: lips, mucosa moist   Neck: symmetrical, trachea midline  Lungs: coarse to auscultation bilaterally with expiratory wheezes, persistent dry barking cough during exam  Heart: paced, regular rate and rhythm  Abdomen: soft, obese, non-tender; bowel sounds active  Extremities: normal, atraumatic, has 1-2+ BLE edema  Peripheral pulses: 2+ and symmetric, all extremities  Skin: BLE venous stasis changes, telangiectasias on upper chest, scattered BUE ecchymosis  Neurologic: alert, oriented, moves all extremities, no focal deficit    Artificial airway:  None                                                                                         Ventilator/ Respiratory Therapy:  NIV vs HFNC    Vent weaning trial:  Not applicable      Laboratory:  LABS:  Recent Labs      11/07/16   1252  11/08/16   0235  11/09/16   0413  11/09/16   1130   NA  135*  134*  133*   --    K  4.3  4.4  3.8  4.2   CL  99  99  98   --    CO2  22  23  25    --    GAP  14*  12  10   --    BUN  73*  74*  67*   --    CR  1.72*  2.07*  1.51*   --    GLU  198*  244*  204*   --    CA  8.7  8.2*  8.4*   --    ALBUMIN  4.2  3.5  3.5   --    MG  2.5  2.5  2.7*   --    PO4  3.7  3.3  3.4   --        Recent Labs  11/07/16   1252  11/07/16   1603  11/08/16   0235  11/09/16   0413   WBC  9.6   --   8.0  11.4*   HGB  13.1*   --   12.1*  12.3*   HCT  39.2*   --   36.2*  36.8*   PLTCT  256   --   207  189   AST  17   --   21  16   ALT  20   --   21  22   ALKPHOS  72   --   58  59   TNI  0.04  0.04   --    --       Estimated Creatinine Clearance: 57.7 mL/min (A) (based on SCr of 1.51 mg/dL (H)).  Vitals:    11/07/16 1225 11/07/16 1526 11/08/16 0943   Weight: 136.1 kg (300 lb) (!) 136.1 kg (300 lb 0.7 oz) 136.1 kg (300 lb)      Recent Labs      11/07/16   1305   PHART  7.41   PO2ART  115*                 Radiology and Other Diagnostic Procedures Review: Reviewed pertinent studies.

## 2016-11-09 NOTE — Progress Notes
ICU Attending Note    [name].  Is critically ill with:    Active Problems:    OSA on CPAP    GERD (gastroesophageal reflux disease)    CAD (coronary artery disease)    AAA (abdominal aortic aneurysm) (HCC)    CKD (chronic kidney disease) stage 3, GFR 30-59 ml/min (HCC)    Chronic combined systolic and diastolic congestive heart failure, NYHA class 2 (HCC)    Chronic obstructive pulmonary disease (HCC)    Ischemic cardiomyopathy    Moderate persistent asthma with acute exacerbation    Iron deficiency    Acute respiratory failure with hypoxia (HCC)    Severe sepsis (HCC)    Lactic acid acidosis    Hyperglycemia    Rhinovirus infection      I spent 40 minutes (excluding time spent performing or supervising any procedures) providing and personally directing critical care services which was discussed with the ICU team including:     Systems review and physical examination   Serial evaluation of hemodynamic data   Serial evaluation of respiratory status   Review and management of ICU prophylaxis and core measures   Review of laboratory data   Review of telemetry data   Review of imaging studies   Review of medications   Fluid and electrolyte management  NIPPVentilator management  Coordination of care between consulting services    Continue high dose solumedrol at 125 q 8 given refractory nature of his bronchospasm.  He feels better but still requiring high flow O2 at 12 LPM.      Staff name:  Cloretta Ned, MD Date:  11/09/2016

## 2016-11-10 LAB — PHOSPHORUS: Lab: 3.3 mg/dL — ABNORMAL HIGH (ref <0.4)

## 2016-11-10 LAB — MAGNESIUM: Lab: 2.8 mg/dL — ABNORMAL HIGH (ref 1.6–2.6)

## 2016-11-10 LAB — POC GLUCOSE
Lab: 275 mg/dL — ABNORMAL HIGH (ref 70–100)
Lab: 192 mg/dL — ABNORMAL HIGH (ref 70–100)
Lab: 209 mg/dL — ABNORMAL HIGH (ref 70–100)
Lab: 188 mg/dL — ABNORMAL HIGH (ref 70–100)

## 2016-11-10 LAB — COMPREHENSIVE METABOLIC PANEL: Lab: 135 MMOL/L — ABNORMAL LOW (ref 137–147)

## 2016-11-10 LAB — CBC AND DIFF: Lab: 11 K/UL — ABNORMAL HIGH (ref >60)

## 2016-11-10 MED ORDER — IPRATROPIUM BROMIDE 0.02 % IN SOLN
0.5 mg | RESPIRATORY_TRACT | 0 refills | Status: DC | PRN
Start: 2016-11-10 — End: 2016-11-11
  Administered 2016-11-10 – 2016-11-11 (×8): 0.5 mg via RESPIRATORY_TRACT

## 2016-11-10 MED ORDER — BUMETANIDE 0.5 MG PO TAB
3 mg | Freq: Two times a day (BID) | ORAL | 0 refills | Status: DC
Start: 2016-11-10 — End: 2016-11-14
  Administered 2016-11-10 – 2016-11-13 (×11): 3 mg via ORAL

## 2016-11-10 MED ORDER — SPIRONOLACTONE 25 MG PO TAB
25 mg | Freq: Two times a day (BID) | ORAL | 0 refills | Status: DC
Start: 2016-11-10 — End: 2016-11-16
  Administered 2016-11-10 – 2016-11-15 (×12): 25 mg via ORAL

## 2016-11-10 MED ORDER — FERROUS SULFATE 325 MG (65 MG IRON) PO TAB
325 mg | Freq: Every day | ORAL | 0 refills | Status: DC
Start: 2016-11-10 — End: 2016-11-16
  Administered 2016-11-11 – 2016-11-16 (×6): 325 mg via ORAL

## 2016-11-10 MED ORDER — BUMETANIDE 0.5 MG PO TAB
3 mg | Freq: Two times a day (BID) | NASOGASTRIC | 0 refills | Status: DC
Start: 2016-11-10 — End: 2016-11-10

## 2016-11-10 MED ORDER — ALBUTEROL SULFATE 2.5 MG/0.5 ML IN NEBU
2.5 mg | RESPIRATORY_TRACT | 0 refills | Status: DC | PRN
Start: 2016-11-10 — End: 2016-11-11
  Administered 2016-11-10 – 2016-11-11 (×8): 2.5 mg via RESPIRATORY_TRACT

## 2016-11-10 MED ORDER — BUMETANIDE 0.5 MG PO TAB
2 mg | Freq: Two times a day (BID) | ORAL | 0 refills | Status: DC
Start: 2016-11-10 — End: 2016-11-10
  Administered 2016-11-10: 15:00:00 2 mg via ORAL

## 2016-11-10 MED ORDER — METHYLPREDNISOLONE SOD SUC(PF) 125 MG/2 ML IJ SOLR
62.5 mg | INTRAVENOUS | 0 refills | Status: DC
Start: 2016-11-10 — End: 2016-11-14
  Administered 2016-11-10 – 2016-11-14 (×13): 62.5 mg via INTRAVENOUS

## 2016-11-10 NOTE — Progress Notes
PHYSICAL THERAPY  PROGRESS NOTE         SUBJECTIVE:  Subjective  Significant hospital events: PMHx includes: depression, chronic back pain, asthma, CKD, HTN, HLD, CAD, ischemic cardiomyopathy, s/p PM, ICD, chronic back pain. Transferred from OSH 11/07/16 with acute respiratory failure and + rhinovirus.  Mental / Cognitive Status: Alert;Oriented;Cooperative  Persons Present: Spouse;Family  Pain: Patient has no complaint of pain  Comments: 6L O2 HFNC    Ambulation Assist: Independent Mobility in [employer] with Endurance Limitations (and cane)  Patient Owned Equipment: Single [employer] Situation: Lives with Family (wife)  Type of Home: House  Entry Stairs: 3-5 Stairs (4)  In-Home Stairs: No Stairs    BED MOBILITY/TRANSFERS:  Bed Mobility/Transfers  Comments: Bed mobility not observed, patient up in chair prior.    Transfer Type: Sit to/from Stand (performed twice)  Transfer: Assistance Level: To/From;Bed Side Chair;Minimal Assist  Transfer: Assistive Device: Nurse, adult  Transfers: Type Of Assistance: Verbal Cues (for hand placement)  End Of Activity Status: Up in Chair;Nursing Notified;Instructed Patient to Request Assist with Mobility;Instructed Patient to Use Call Light    GAIT:  Gait  Gait Distance: 28 feet, seated rest, another 28 feet  Gait: Assistance Level: Minimal Assist  Gait: Assistive Device: Roller Walker  Gait: Descriptors: Pace: Slow;Forward trunk flexion (posture improved with cues)  Activity Limited By: Complaint of Fatigue;SOA  Comments: SpO2 90% at end of ambulation, improved to 93% with seated rest.    EDUCATION:  Education  Persons Educated: Patient/Family  Teaching Methods: Verbal Instruction;Demonstration  Patient Response: Verbalized Understanding;Return Demonstration;More Instruction Required  Topics: Plan/Goals of PT Interventions;Use of Assistive Device/Orthosis;Mobility Progression;Safety Awareness;Up with Assist Only;Ambulate With Nursing    ASSESSMENT/PROGRESS: Assessment/Progress  Impaired Mobility Due To: Medical Status Limitation;Impaired Balance;Decreased Activity Tolerance;Deconditioning  Assessment/Progress: Expect Good Progress     AM-PAC 6 Clicks Basic Mobility Inpatient  Turning from your back to your side while in a flat bed without using bed rails: A Little  Moving from lying on your back to sitting on the side of a flatbed without using bedrails : A Little  Moving to and from a bed to a chair (including a wheelchair): A Little  Standing up from a chair using your arms (e.g. wheelchair, or bedside chair): A Little  To walk in hospital room: A Little  Climbing 3-5 steps with a railing: A Lot  Raw Score: 17  Standardized (T-scale) Score: 39.67  Basic Mobility CMS 0-100%: 43.83  CMS G Code Modifier for Basic Mobility: CK    GOALS:  Goals  Goal Formulation: With Patient  Time For Goal Achievement: 3 days, To, 7 days  Pt Will Go Supine To/From Sit: w/ Stand By Assist  Pt Will Transfer Sit to Stand: w/ Stand By Assist  Pt Will Ambulate: 51-100 Feet, w/ Cane, w/ Stand By Assist (3 reps with 2 minute seated rest between)  Pt Will Go Up / Down Stairs: 3-5 Stairs, w/ Stand By Assist (using handrail)    PLAN:  Plan   Treatment Interventions: Mobility Training;Strengthening;Balance Activities;Endurance Training  Plan Frequency: 5 Days per Week  PT Plan for Next Visit: Bed mobility with HOB flat, increase ambulation distance and bouts, assess stairs     P.T. mobility aide to assist with ongoing mobilization and exercise per instructions of licensed physical therapy staff.     RECOMMENDATIONS:  PT Discharge Recommendations  PT Discharge Recommendations: Home with Assistance and Home Health Setting  Equipment Recommendations: Roller Walker  Patient requires the  use of a walker with wheels to complete ADLs in the home including meal preparation, ambulation the bathroom for toileting, bathing and grooming, and safe home mobility.  Patient is unable to complete these ADLs with a cane or crutch and can safely use the walker.     Recommend ongoing assistance for: In and out of house;Transfers;Ambulation;Stairs    Therapist: Sherilyn Banker, PT, DPT   Date: 11/10/2016

## 2016-11-10 NOTE — Discharge Planning (AHS/AVS)
Home health nurse will call you tomorrow to arrange a time for the first visit.  Please be aware nurse is coming from other patient's home and will provide a time frame for visit and not a specific time.      [facility]  [phone]

## 2016-11-10 NOTE — Progress Notes
9/20    2000  Pt care assumed, nursing assessment completed per ICU flow sheet. Pt alert and oriented x 4, resting comfortably in chair at this time. VS per trends, continuing to wean oxygen as pt tolerates. Pt's wife at the bedside, will continue to monitor.    9/21    0000  Reassessment completed, no significant changes from previous assessment. VS per trends.    0400  Reassessment completed, no significant changes from previous assessment. VS per trends.

## 2016-11-10 NOTE — Case Management (ED)
Case Management Progress Note    NAME:Ruben Wood.                          MRN: [id number]              DOB:1940/03/21          AGE: 76 y.o.  ADMISSION DATE: 11/07/2016             DAYS ADMITTED: LOS: 3 days      Todays Date: 11/10/2016    Plan: Home with Home Health when medically stable.   Faxed referral to [facility] [phone]  F [phone] and spoke to [place].    Orders started in O2   Information added to AVS.      Interventions  ? Support      ? Info or Referral      ? Discharge Planning      ? Medication Needs      ? Financial      ? Legal      ? Other        Disposition  ? Expected Discharge Date    Expected Discharge Date: 11/14/16  ? Transportation   Does the patient need discharge transport arranged?: No  Transportation Name, Phone and Availability #1: [name] - wife [phone]  Does the patient use Medicaid Transportation?: No  ? Next Level of Care (Acute Psych discharges only)      ? Discharge Disposition                                          Durable Medical Equipment     No service has been selected for the patient.      [facility] [facility]     No service has been selected for the patient.      [facility]     No service has been selected for the patient.      [facility]     No service has been selected for the patient.        Lenon Ahmadi RN, BSN  Nurse Case Manager  [phone] or [phone]

## 2016-11-10 NOTE — Progress Notes
OCCUPATIONAL THERAPY  ASSESSMENT NOTE    Patient Name: Ruben Wood.                   Room/Bed: [id number]  Admitting Diagnosis: Respiratory Failure    Past Medical History:   Diagnosis Date   ??? AAA (abdominal aortic aneurysm) (HCC) 10/15/2012   ??? Asthma    ??? Bronchiectasis (HCC)    ??? CAD (coronary artery disease) 08/15/2012    07/11/2002- Cath @ [facility] showed Severe double vessel disease(OMB of circumflex and distal circ)  inferior-basilar dyskinesis.                 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  09/12/12   Cath    ??? Cardiomyopathy (HCC) 07/19/2014   ??? Cellulitis of left lower extremity 11/12/2014    10/2014: admitted with cellulitis, [place] negative for venous clot, MRI without osteomyelitis.    11/12/2014 In clinic today, still with cellulitis.  Per patient and his wife, the erythema may be extending.  Cultures from drainage in hospital with MSSA, but Blood Cx negative.  No e/o osteomyelitis.  My concern is that this antibiotic regimen is not adequately treating his cellulitis.  We will broaden coverage to get MRSA with doxycycline.  Patient and wife given strict call/return criteria.  I will see patient in 3-4 days for re-evaluation.  No fever today.  Plan:  Stop cefpodoxime and start doxycylcine  11/17/2014 Much better, no warmth and erythema minimal.   Plan: continue doxycyline for full 10 day course.      ??? Chest wall deformity 07/09/2012    Chest wall reconstruction on 07/09/12    ??? CHF (congestive heart failure) (HCC)    ??? 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 Chronic cough 08/29/2012   ??? 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 COPD (chronic obstructive pulmonary disease) (HCC) 08/13/2012   ??? Dyspnea    ??? Edema    ??? Essential hypertension 08/13/2012   ??? GERD (gastroesophageal reflux disease) 08/13/2012   ??? History of CHF (congestive heart failure) 08/13/2012   ??? History of MRSA infection 10/14/2012   ??? 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    ??? HTN (hypertension)    ??? Hyperlipemia    ??? Hypertension 08/13/2012   ??? 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 Mixed dyslipidemia 08/26/2013   ??? Morbid obesity (HCC) 08/13/2012   ??? 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  Discussed the importance of diet.  Exercise as tolerated.      ??? Obesity    ??? On supplemental oxygen  therapy    ??? OSA on CPAP 08/13/2012   ??? Pneumonia 04/2012    [facility]   ??? Seasonal allergic reaction    ??? Sleep apnea        Mobility  Progressive Mobility Level: Stand (Capable of more activity, completed full bath. )  Level of Assistance: Stand by assistance  Assistive Device: Cane  Time Tolerated: 11-30 minutes  Activity Limited By: No limitations    Subjective  Pertinent Dx per Physician: PMHx includes: depression, chronic back pain, asthma, CKD, HTN, HLD, CAD, ischemic cardiomyopathy, s/p PM, ICD, chronic back pain.  Transferred from OSH 11/07/16 with acute respiratory failure and + rhinovirus.  Precautions: Falls  Pain / Complaints: Patient agrees to participate in therapy Comments: Pt sitting at EOB as OT enters. Left sitting in this position. One significant episode of coughing.     Objective  Psychosocial Status: Willing and Cooperative to Participate    Home Living  Type of Home: Engineering geologist / Tub: Pension scheme manager: Midwife: Biomedical scientist: Accessible via Landscape architect: Medical laboratory scientific officer    Prior Function  Level Of Independence: Independent with ADLs and functional transfers;Needed assistance with homemaking  Lives With: Spouse  Receives Help From: Spouse  Homemaking Tasks: Meal Prep;Laundry;Cleaning;Shopping;Driving  Vocational:  (Part-time Advertising account executive. )    Vision  Current Vision:  (Wears glasses)    ADL's  Grooming Assist: Stand By Assist  Grooming Deficits: Setup  Bathing Assist: [employer]  Bathing Deficits: Supervision/Safety  UE Dressing Assist: Stand By Assist  LE Dressing Assist: Stand By Assist  Functional Transfer Assist: Minimal Assist  Comment: Sponge bathing in sitting with SBA for set up. Able to complete UB and LB bathing. One episode of coughing, though no desaturation. Left sitting in chair to complete shaving task and brushing teeth. No titration of O2 needed with activity and remained on 6L. Increased time needed to recover from coughing episode.    Activity Tolerance  Endurance: 3/5 Tolerates 25-30 Minutes Exercise w/Multiple Rests  Sitting Balance: 5/5 Moves/Returns Trunkal Midpoint in All Planes > 2 Inches    Cognition  Overall Cognitive Status: WFL to Adequately Complete Self Care Tasks Safely    UE AROM  Coordination: Adequate to Complete ADLs  Grasp: Bilateral Grasp Functional for Activity      Sensory  Overall Sensory: Pt Perceives Pressure in Both UEs in Gross Exam     UE Strength / Tone  Overall Strength / Tone: WFL Able to Perform ADL Tasks    Education  Persons Educated: Patient  Barriers To Learning: None Noted  Teaching Methods: Verbal Instruction Patient Response: Verbalized and Demo Understanding  Topics: Role of OT, Goals for Therapy  Goal Formulation: With Patient    Assessment  Assessment: Decreased ADL Status;Decreased Endurance;Decreased Self-Care Trans;Decreased High-Level ADLs  Prognosis: Good  Goal Formulation: Patient    AM-PAC 6 Clicks Daily Activity Inpatient  Putting on and taking off regular lower body clothes?: A Little  Bathing (Including washing, rinsing, drying): A Little  Toileting, which includes using toilet, bedpan, or urinal: A Little  Putting on and taking off regular upper body clothing: None  Taking care of personal grooming such as brushing teeth: None  Eating meals?: None  Daily Activity Raw Score: 21  Standardized (t-scale) score: 44.27  CMS 0-100% Score: 32.79  CMS G Code Modifier: CJ    Plan   OT Frequency: 5x/week  OT Plan for  Next Visit: Standing ADLs, progress mobility.      ADL Goals  Patient Will Perform All ADL's: w/ Modified Independence    Functional Transfer Goals  Pt Will Perform All Functional Transfers: Modified Independent     OT Discharge Recommendations  OT Discharge Recommendations: Home with family assist  Equipment Recommendations: None  Recommend ongoing assistance for: In and out of house, Transfers, Bed mobility, Ambulation, Stairs    G-Codes: Self-care  [phone] Current Status:  20-39% Impairment  [id number] Goal Status:  1-19% Impairment    Based on above evaluation and clinical judgment.      Therapist: Mathis Dad, OTR/L [id number]  Date: 11/10/2016

## 2016-11-10 NOTE — Progress Notes
RT Adult Assessment Note    NAME:Ruben Wood.             MRN: [id number]             DOB:1940-10-30          AGE: 76 y.o.  ADMISSION DATE: 11/07/2016             DAYS ADMITTED: LOS: 3 days    RT Treatment Plan:  Protocol Plan: Medications  Albuterol/Ipratropium: Neb Q4h & PRN (per pulmonary)    Protocol Plan: Procedures  Oxygen/Humidity: O2 to keep SpO2 > 92%  Monitoring: Pulse oximetry continuous (document SpO2 results Q4h)    Additional Comments:  Impressions of the patient: sitting in chair, strong cough, alert  Intervention(s)/outcome(s): PT still wheezing this AM, breathing treatments changed to Q4 and PRN  Patient education that was completed:   Recommendations to the care team: addressed    Vital Signs:  Pulse: Pulse: 77  RR: Respirations: 19 PER MINUTE  SpO2: SpO2: 97 %  O2 Device: $$ O2 Device: High Flow Nasal Cannula  Liter Flow: O2 Liter Flow: 6 lpm  O2%:    Breath Sounds:  expiratory wheezes  Respiratory Effort: Respiratory Effort: Non-Labored

## 2016-11-11 DIAGNOSIS — A419 Sepsis, unspecified organism: Principal | ICD-10-CM

## 2016-11-11 LAB — MAGNESIUM: Lab: 2.4 mg/dL — ABNORMAL LOW (ref >60)

## 2016-11-11 LAB — POC GLUCOSE
Lab: 290 mg/dL — ABNORMAL HIGH (ref 70–100)
Lab: 175 mg/dL — ABNORMAL HIGH (ref 70–100)
Lab: 204 mg/dL — ABNORMAL HIGH (ref 70–100)
Lab: 174 mg/dL — ABNORMAL HIGH (ref 70–100)

## 2016-11-11 LAB — PHOSPHORUS: Lab: 3 mg/dL — ABNORMAL LOW (ref >60)

## 2016-11-11 LAB — COMPREHENSIVE METABOLIC PANEL: Lab: 137 MMOL/L — ABNORMAL LOW (ref >60)

## 2016-11-11 LAB — CBC AND DIFF: Lab: 9.4 K/UL — ABNORMAL LOW (ref >60)

## 2016-11-11 MED ORDER — GUAIFENESIN 600 MG PO TA12
600 mg | Freq: Two times a day (BID) | ORAL | 0 refills | Status: DC
Start: 2016-11-11 — End: 2016-11-16
  Administered 2016-11-11 – 2016-11-16 (×11): 600 mg via ORAL

## 2016-11-11 MED ORDER — MONTELUKAST 10 MG PO TAB
10 mg | Freq: Every evening | ORAL | 0 refills | Status: DC
Start: 2016-11-11 — End: 2016-11-16
  Administered 2016-11-12 – 2016-11-16 (×5): 10 mg via ORAL

## 2016-11-11 MED ORDER — IPRATROPIUM BROMIDE 0.02 % IN SOLN
0.5 mg | Freq: Four times a day (QID) | RESPIRATORY_TRACT | 0 refills | Status: DC | PRN
Start: 2016-11-11 — End: 2016-11-14
  Administered 2016-11-12 – 2016-11-14 (×10): 0.5 mg via RESPIRATORY_TRACT

## 2016-11-11 MED ORDER — POLYETHYLENE GLYCOL 3350 17 GRAM PO PWPK
1 | Freq: Every day | ORAL | 0 refills | Status: DC
Start: 2016-11-11 — End: 2016-11-16
  Administered 2016-11-11 – 2016-11-15 (×5): 17 g via ORAL

## 2016-11-11 MED ORDER — ALBUTEROL SULFATE 2.5 MG/0.5 ML IN NEBU
2.5 mg | Freq: Four times a day (QID) | RESPIRATORY_TRACT | 0 refills | Status: DC | PRN
Start: 2016-11-11 — End: 2016-11-14
  Administered 2016-11-12 – 2016-11-14 (×10): 2.5 mg via RESPIRATORY_TRACT

## 2016-11-11 NOTE — Progress Notes
Cardiology Daily Progress Note  11/11/2016    Ruben Wood.            Admission Date:  11/07/2016                     Assessment/Plan:    Active Problems:    OSA on CPAP    GERD (gastroesophageal reflux disease)    CAD (coronary artery disease)    AAA (abdominal aortic aneurysm) (HCC)    CKD (chronic kidney disease) stage 3, GFR 30-59 ml/min (HCC)    Chronic combined systolic and diastolic congestive heart failure, NYHA class 2 (HCC)    Chronic obstructive pulmonary disease (HCC)    Ischemic cardiomyopathy    Moderate persistent asthma with acute exacerbation    Iron deficiency    Acute respiratory failure with hypoxia (HCC)    Severe sepsis (HCC)    Lactic acid acidosis    Hyperglycemia    Rhinovirus infection        1. Ac on ch diast HF  Current echo showing an EF of 50% with a PFO.  It was a limited study.  The IVC was significantly dilated.  Has a normally functioning bi-V ICD in place.  Net -ve 9.6 l so far.    -Continue Bumex 3 mg twice daily.    2.  Coronary disease, with chronic occlusion of mid circumflex, OM and RCA.  He is on aspirin, statins, low-dose beta-blockers.      -Continue medical treatment.    __________________________________________________________________________________    Subjective:  Soa, persisting cough, says that the soa is better than o/a. No palps, dizziness or cp.    Allergies:  Other [unclassified drug]    Medications:  Scheduled Meds:  albuterol 0.5% (PROVENTIL; VENTOLIN) nebulizer solution 2.5 mg 2.5 mg Inhalation Q4H & PRN   allopurinol (ZYLOPRIM) tablet 300 mg 300 mg Oral QDAY   amitriptyline/gabapentin/emu oil(#) 05/24/08 % topical cream 5 g 5 g Topical TID   amoxicillin/K clavulanate (AUGMENTIN) tablet 875 mg 875 mg Oral BID w/meals   ascorbic acid (VITAMIN C) tablet 500 mg 500 mg Oral QDAY   aspirin tablet 325 mg 325 mg Oral QDAY   atorvastatin (LIPITOR) tablet 20 mg 20 mg Oral QDAY   budesonide/formoterol (SYMBICORT HFA) 160/4.5 mcg inhalation 2 puff 2 puff Inhalation BID   bumetanide (BUMEX) tablet 3 mg 3 mg Oral BID(9-17)   cetirizine (ZYRTEC) tablet 10 mg 10 mg Oral QAM8   cholecalciferol (VITAMIN D-3) tablet 1,000 Units 1,000 Units Oral QDAY   duloxetine DR (CYMBALTA) capsule 60 mg 60 mg Oral QDAY   enoxaparin (LOVENOX) syringe 40 mg 40 mg Subcutaneous BID   ferrous sulfate (FEOSOL, FEROSUL) tablet 325 mg 325 mg Oral QDAY   gabapentin (NEURONTIN) capsule 400 mg 400 mg Oral Q8H*   insulin aspart U-100 (NOVOLOG FLEXPEN) injection PEN 0-28 Units 0-28 Units Subcutaneous ACHS   ipratropium bromide (ATROVENT) 0.02 % nebulizer solution 0.5 mg 0.5 mg Inhalation Q4H & PRN   melatonin tablet 3 mg 3 mg Oral QHS   methylPREDNISolone (SOLU-MEDROL PF) injection 62.5 mg 62.5 mg Intravenous Q8H   metoprolol XL (TOPROL XL) tablet 25 mg 25 mg Oral QDAY   pantoprazole DR (PROTONIX) tablet 40 mg 40 mg Oral BID   senna/docusate (SENOKOT-S) tablet 1 tablet 1 tablet Oral BID   spironolactone (ALDACTONE) tablet 25 mg 25 mg Oral BID(9-17)   Continuous Infusions:  PRN and Respiratory Meds:acetaminophen Q6H PRN, benzonatate TID PRN, calcium gluconate  IV PRN (On Call from Rx) **AND** Ionized Calcium PRN **AND** Notify Physician Ongoing, codeine/guaiFENesin Q4H PRN, magnesium sulfate PRN **AND** Magnesium PRN **AND** Notify Physician Ongoing, ondansetron Q6H PRN, potassium chloride SR PRN **OR** potassium chloride PRN, sodium phosphate  IVPB PRN (On Call from Rx) **AND** Phosphorus PRN **AND** Notify Physician Ongoing       Physical Exam:  Vital Signs: Last Filed In 24 Hours Vital Signs: 24 Hour Range   BP: 128/59 (09/22 0400)  Temp: 36.4 ???C (97.5 ???F) (09/22 0800)  Pulse: 91 (09/22 0900)  Respirations: 20 PER MINUTE (09/22 0900)  SpO2: 92 % (09/22 0900)  O2 Delivery: Non Invasive Ventilation (CPAP) (09/22 0400)  SpO2 Pulse: 92 (09/22 0900) BP: (126-136)/(59-93)   Temp:  [36.1 ???C (97 ???F)-36.7 ???C (98 ???F)]   Pulse:  [66-97]   Respirations:  [14 PER MINUTE-22 PER MINUTE]   SpO2:  [92 %-98 %] O2 Delivery: Non Invasive Ventilation (CPAP)   Intensity Pain Scale (Self Report): 5 (11/11/16 0900)    Intake/Output Summary: (Last 24 hours)    Intake/Output Summary (Last 24 hours) at 11/11/16 0955  Last data filed at 11/11/16 0900   Gross per 24 hour   Intake             1770 ml   Output             5150 ml   Net            -3380 ml       Physical Exam   General Appearance: well developed, well nourished, persisting cough  Neck Veins: neck veins are not distended   Chest Inspection: chest is normal in appearance   Respiratory Effort: appears soa   Auscultation/Percussion: lungs with bilat wheezing, coarse   Cardiac Rhythm: regular rhythm and slightly tachy   Cardiac Auscultation: S1, S2 normal, no rub, no gallop   Murmurs: no murmur   Carotid Arteries: no bruits   Radial Arteries: normal symmetric radial pulses   Lower Extremity: no lower extremity edema, discolored and cool feet/toes L>R   Abdominal Exam: soft, non-tender, non-distended, normal bowel sounds   Neurologic Exam: neurological assessment grossly intact   Other: moves all extremities    Laboratory Review:    Hematology:    Lab Results   Component Value Date    HGB 13.4 11/11/2016    HCT 39.2 11/11/2016    PLTCT 184 11/11/2016    WBC 9.4 11/11/2016    NEUT 94 11/11/2016    ANC 8.80 11/11/2016    ALC 0.40 11/11/2016    MONA 2 11/11/2016    AMC 0.20 11/11/2016    ABC 0.00 11/11/2016    MCV 104.5 11/11/2016    MCHC 34.2 11/11/2016    MPV 8.2 11/11/2016    RDW 17.7 11/11/2016    and General Chemistry:    Lab Results   Component Value Date    NA 137 11/11/2016    K 4.0 11/11/2016    CL 101 11/11/2016    GAP 10 11/11/2016    BUN 56 11/11/2016    CR 1.29 11/11/2016    GLU 193 11/11/2016    CA 8.0 11/11/2016    ALBUMIN 3.5 11/11/2016    LACTIC 0.9 10/25/2012    OBSCA 1.03 10/26/2012    MG 2.4 11/11/2016    TOTBILI 0.9 11/11/2016     bnp 92  Trop -ve  ECG: A sense V pace  Telemetry: nsr, V paced, pvcs slow VT  Eugene Gavia, MD

## 2016-11-11 NOTE — Progress Notes
Patient arrived to room # [id number]*) via wheelchair accompanied by transport. Patient transferred to the chair without assistance. Bedside safety checks completed. Initial patient assessment completed, refer to flowsheet for details. Admission skin assessment completed by: Rolly Salter, RN and Mauro Kaufmann, RN    Pressure Injury Present on Hospital Admission (within 24 hours): No    1. Occiput: No  2. Ear: No  3. Scapula: No  4. Spinous Process: No  5. Shoulder: No  6. Elbow: No  7. Iliac Crest: No  8. Sacrum/Coccyx: No  9. Ischial Tuberosity: No  10. Trochanter: No  11. Knee: No  12. Malleolus: No  13. Heel: No  14. Toes: No  15. Assessed for device associated injury No  16. Nursing Nutrition Assessment Completed Yes    See Doc Flowsheet for additional wound details.   Moisture breakdown noted in the groin and abdominal areas.     INTERVENTIONS: Med team paged for potential nystatin powder for groin and abdomen.     [name]

## 2016-11-11 NOTE — Progress Notes
RT Adult Assessment Note    NAME:Ruben Wood.             MRN: [id number]             DOB:09-24-1940          AGE: 76 y.o.  ADMISSION DATE: 11/07/2016             DAYS ADMITTED: LOS: 4 days    RT Treatment Plan:  Protocol Plan: Medications  Albuterol/Ipratropium: Neb Q4h While Awake & PRN    Protocol Plan: Procedures  Vibrating PEP Therapy: Q4h While Awake  Oxygen/Humidity: O2 to keep SpO2 > 92%  Monitoring: Pulse oximetry BID & PRN  Comment: Symbicort BID    Additional Comments:  Impressions of the patient: Alert and sitting in chair. SOA with talking and excursion.  Intervention(s)/outcome(s): none  Patient education that was completed: none  Recommendations to the care team: none    Vital Signs:  Pulse: Pulse: 88  RR: Respirations: 20 PER MINUTE  SpO2: SpO2: 95 %  O2 Device: $$ O2 Device: High Flow Nasal Cannula  Liter Flow: O2 Liter Flow: 4 lpm  O2%:    Breath Sounds: All Breath Sounds: Clear (implies normal);Decreased  Respiratory Effort: Respiratory Effort: Non-Labored

## 2016-11-11 NOTE — Progress Notes
~  0740: Report received and bedside safety check completed with Meg, patient's night RN. Day RN assumed pt care at this time. Pt sitting up in chair awaiting breakfast. No immediate concerns to report. VSS per pt.    ~0800: Patient (pt) assessment completed - please see doc flowsheet for details. Pt plan of care reviewed and updated. Vital signs stable and within desired limits per pt trends. Pt alert and oriented to person, place, time, and situation.  Denies pain, nausea, and/or vomiting. No additional concerns for pt at this time. Will continue to monitor pt status closely.    ~0900: Pt c/o "sinus headache" - see eMAR for treatment.     ~1200: Patient assessment as charted in flowsheet. Findings consistent with previous assessment. VSS and WDL. Will continue to monitor.     ~1430: Report given to [facility], RN. [phone] is currently being cleaned prior to transfer.    ~1630: Pt en route to [id number] via wheelchair with transport. VSS prior to transfer.

## 2016-11-11 NOTE — Progress Notes
9/21    2000  Pt care assumed, nursing assessment completed per ICU flow sheet. Pt alert and oriented x 4, with no complaints of pain at this time. VS per trends. Continuing to wean O2 as pt tolerates, currently breathing comfortably on 4 L HFNC. Pt's wife at the bedside, will continue to monitor per Telemetry standard of care.     9/22    0000  Reassessment completed, no significant changes from previous assessment. VS per trends.    0400  Reassessment completed, no significant changes from previous assessment. VS per trends.

## 2016-11-12 LAB — POC GLUCOSE
Lab: 299 mg/dL — ABNORMAL HIGH (ref 70–100)
Lab: 239 mg/dL — ABNORMAL HIGH (ref 70–100)
Lab: 145 mg/dL — ABNORMAL HIGH (ref 70–100)
Lab: 219 mg/dL — ABNORMAL HIGH (ref 70–100)

## 2016-11-12 LAB — COMPREHENSIVE METABOLIC PANEL: Lab: 137 MMOL/L — ABNORMAL LOW (ref 137–147)

## 2016-11-12 LAB — MAGNESIUM: Lab: 2.3 mg/dL — ABNORMAL LOW (ref >60)

## 2016-11-12 LAB — CBC AND DIFF: Lab: 8.6 10*3/uL — ABNORMAL LOW (ref 4.5–11.0)

## 2016-11-12 LAB — PHOSPHORUS: Lab: 3.2 mg/dL — ABNORMAL LOW (ref >60)

## 2016-11-12 NOTE — Progress Notes
PHYSICAL THERAPY  MOBILITY NOTE     Patient was mobilized today with the assistance of the P.T. mobility aide as part of the ongoing physical therapy plan of care.    Mobility  Progressive Mobility Level: Walk in room  Distance Walked (feet): 10 ft  Level of Assistance: Stand by assistance  Assistive Device: Walker  Time Tolerated: 0-10 minutes  Activity Limited By: Shortness of air    Aide: Lindaann Pascal  Date: 11/12/2016

## 2016-11-12 NOTE — Progress Notes
General Progress Note      Today's Date:  11/12/2016  Admission Date: 11/07/2016  LOS: 5 days                     Assessment/Plan:    Active Problems:    OSA on CPAP    GERD (gastroesophageal reflux disease)    CAD (coronary artery disease)    AAA (abdominal aortic aneurysm) (HCC)    CKD (chronic kidney disease) stage 3, GFR 30-59 ml/min (HCC)    Chronic combined systolic and diastolic congestive heart failure, NYHA class 2 (HCC)    Chronic obstructive pulmonary disease (HCC)    Ischemic cardiomyopathy    Moderate persistent asthma with acute exacerbation    Iron deficiency    Acute respiratory failure with hypoxia (HCC)    Severe sepsis (HCC)    Lactic acid acidosis    Hyperglycemia    Rhinovirus infection      76 y/o M???w/ PMH depression, chronic pain, asthma, CKD, HTN, HLD, CAD, ischemic cardiomyopathy s/p pacer, ICD &???cardioMEMS implant. Presented 9/18 in transfer from [facility] w/ acute hypoxic???respiratory failure. Presented there 9/13 w/ persistent cough causing SOA &???was treated w/ levaquin &???steroid. On 9/18 had progressive increase in O2 requirement &???placed on BiPAP prior to transfer to [facility]. RVP +rhinovirus &???sputum gram stain w/???many GPC resembling strep (despite being on levaquin). Have narrowed antbx to augmentin to complete 7d. Remains on 4L HFNC w/???significant cough; continuing solumedrol, duonebs &???diuresis. Consulted heart failure team &???increased Bumex while on IV steroids. He transferred to the floor on 9/23.    Acute Respiratory Failure w/ Hypoxia; Cough   Severe Sepsis, Rhinovirus  Hx Allergic Asthma; Bronchiectasis   - Multifactorial 2/2 viral/bacterial infection vs asthma exacerbation vs bronchitis vs volume overload (w/ recent decrease in diuretic dosing ~8/21)  - Follows w/ Dr. 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 - Has been on Zyflo and Xolair in the past; not currently  - Was given z-pak & 5 day course of pred 8/14; then 9/10 given doxycycline & presented to ED at OSH 9/13 w/ SOB  - Tx w/ levaquin & methylpred (progressive increase in dose)  - OSH BNP 1200 on 9/18 (up from 670 on 9/13)  - Had progressive increase in O2 up to 12LNC &???then BiPAP  - On admit, NIV 8/5 60%  - ABG on admit: 7.41/39/115/24.8  - BNP 92  - RVP: +rhinovirus  - Blood cx 9/18: ngtd  - Sputum cx 9/18: GS with many GPC resembling strep; cx normal flora  - CXR w/ development of right basilar consolidation -> when compared to CT - ? percardial fat  Plan  - Given normalization of PA pressures, resumed PTA diuretics as below (w/ increased bicarb)  - Continue methylpred 62.5mg  q8h->possible transition to 60mg  prednisone po in AM   - Continue PTA duonebs & symbicort/singulair  - Continue PTA tessalon perles, zyrtec, & guaifenesin w/ codeine, mucinex, acapella  - Pulmonary following  - Continue augmentin PO to complete 7d through 9/25    Combined Systolic & Diastolic Heart Failure; HTN, HLD  CAD & Ischemic Cardiomyopathy s/p Pacemaker, CRT-D  - Echo 05/04/16: LVEF 40% (had been as low as 20%), RV w/ borderline low EF, LA mildly dilated, RA normal; mild-mod MR, mild TR  - Of note, bumex  had been increased in June & then reduced from 4mg  BID to 2mg  BID on 8/21 due to elevations in BUN/Cr  - BNP 92  - EKG paced rhythm, rate 90s, trop negative  - Repeat echo 9/18: LVEF 45-50%; RV normal function; mild to mod MR; no pericardial effusion  - Bubble study negative 9/19  - CardioMEMS read on 9/17 PAP 42/20 (baseline appears 20s over ~10)  - CardioMEMS read on 9/20???PAP 22/7 (baseline)???-->???cardiology will recheck daily  - ICD interrogated 9/21  - SBP 120s-130s, HR 70s-80s  Plan  - Continue PTA lipitor & ASA  - Continue PTA metoprolol XL 25mg  daily  - Increased PTA Bumex to 3mg  BID while receiving IV steroids???&???spironolactone 25mg  BID  - 1.5L fluid restrirction - Heart failure following   - Will arrange for HF follow up at time of discharge    LE Swelling/Discoloration (LLE>RLE)  - Pt reports he has had vascular surgery to BLE  - Lower legs/feet erythematous & purple. 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 Patent trifurcation arteries to the ankle bilaterally.  - will discuss case with vascular surgery    Hyperglycemia  - In setting of steroid  - BS mid-high 100s-200s???last 24 hrs  - Continue HDCF (36u/24h)  - Should improve when off steroids    Iron Deficiency Anemia  - Iron studies 8/17: iron 111, % sat low 22, TIBC high 499, ferritin 38  - S/p IV iron infusion 8/21  - Hgb 13.1 today  - Continue???PTA iron    AAA  - S/p endovascular repair  - Follows w/???Dr. Chales Abrahams    OSA  - CPAP qHS -->has been on bipap here qHS 8/5 50%    Depression  - Continue PTA cymbalta     Chronic Low Back Pain  - Follows at [facility]  - 2/2 falling off roof; has had epidural injections in the past  - Continue PTA amitriptyline/gabapentin/emu oil, gabapentin, & lidoderm patches  - Holding PTA tylenol 3, oxycodone, tizanidine    DVT PPX: Lovenox  Lines/Foley: PIV  Code Status: Full Code  Dispo: inpt    Isidoro Donning, MD  [facility]  [phone]    ________________________________________________________________________    Subjective  Ruben Wood. is a 76 y.o. male.  Patient states he feels much better than he has in the past few days.  Continues to complain of wheezing and dyspnea.  States he has not been allowed to walk in the hallways but would like to today if possible.  States LE discoloration and temp have been persistent for years.      Review of Systems:  All other systems reviewed and are negative. except for dyspnea and wheezing Medications  Scheduled Meds:  albuterol 0.5% (PROVENTIL; VENTOLIN) nebulizer solution 2.5 mg 2.5 mg Inhalation QID & PRN   allopurinol (ZYLOPRIM) tablet 300 mg 300 mg Oral QDAY   amitriptyline/gabapentin/emu oil(#) 05/24/08 % topical cream 5 g 5 g Topical TID   amoxicillin/K clavulanate (AUGMENTIN) tablet 875 mg 875 mg Oral BID w/meals   ascorbic acid (VITAMIN C) tablet 500 mg 500 mg Oral QDAY   aspirin tablet 325 mg 325 mg Oral QDAY   atorvastatin (LIPITOR) tablet 20 mg 20 mg Oral QDAY   budesonide/formoterol (SYMBICORT HFA) 160/4.5 mcg inhalation 2 puff  2 puff Inhalation BID   bumetanide (BUMEX) tablet 3 mg 3 mg Oral BID(9-17)   cetirizine (ZYRTEC) tablet 10 mg 10 mg Oral QAM8   cholecalciferol (VITAMIN D-3) tablet 1,000 Units 1,000 Units Oral QDAY   duloxetine DR (CYMBALTA) capsule 60 mg 60 mg Oral QDAY   enoxaparin (LOVENOX) syringe 40 mg 40 mg Subcutaneous BID   ferrous sulfate (FEOSOL, FEROSUL) tablet 325 mg 325 mg Oral QDAY   gabapentin (NEURONTIN) capsule 400 mg 400 mg Oral Q8H*   guaiFENesin LA (MUCINEX) tablet 600 mg 600 mg Oral BID   insulin aspart U-100 (NOVOLOG FLEXPEN) injection PEN 0-28 Units 0-28 Units Subcutaneous ACHS   ipratropium bromide (ATROVENT) 0.02 % nebulizer solution 0.5 mg 0.5 mg Inhalation QID & PRN   melatonin tablet 3 mg 3 mg Oral QHS   methylPREDNISolone (SOLU-MEDROL PF) injection 62.5 mg 62.5 mg Intravenous Q8H   metoprolol XL (TOPROL XL) tablet 25 mg 25 mg Oral QDAY   montelukast (SINGULAIR) tablet 10 mg 10 mg Oral QHS   pantoprazole DR (PROTONIX) tablet 40 mg 40 mg Oral BID   polyethylene glycol 3350 (MIRALAX) packet 17 g 1 packet Oral QDAY   senna/docusate (SENOKOT-S) tablet 1 tablet 1 tablet Oral BID   spironolactone (ALDACTONE) tablet 25 mg 25 mg Oral BID(9-17)   Continuous Infusions:  PRN and Respiratory Meds:acetaminophen Q6H PRN, benzonatate TID PRN, calcium gluconate  IV PRN (On Call from Rx) **AND** Ionized Calcium PRN **AND** Notify Physician Ongoing, codeine/guaiFENesin Q4H PRN, magnesium sulfate PRN **AND** Magnesium PRN **AND** Notify Physician Ongoing, ondansetron Q6H PRN, potassium chloride SR PRN **OR** potassium chloride PRN, sodium phosphate  IVPB PRN (On Call from Rx) **AND** Phosphorus PRN **AND** Notify Physician Ongoing      Objective                       Vital Signs: Last Filed                 Vital Signs: 24 Hour Range   BP: 133/66 (09/23 0824)  Temp: 36.4 ???C (97.5 ???F) (09/23 1610)  Pulse: 92 (09/23 0840)  Respirations: 20 PER MINUTE (09/23 0824)  SpO2: 98 % (09/23 0840)  O2 Delivery: Nasal Cannula (09/23 0824)  SpO2 Pulse: 86 (09/22 1600) BP: (110-133)/(53-83)   Temp:  [36.4 ???C (97.5 ???F)-36.6 ???C (97.8 ???F)]   Pulse:  [74-99]   Respirations:  [16 PER MINUTE-22 PER MINUTE]   SpO2:  [91 %-98 %]   O2 Delivery: Nasal Cannula   Intensity Pain Scale (Self Report): 0 (11/11/16 1200) Vitals:    11/07/16 1526 11/08/16 0943 11/11/16 0000   Weight: (!) 136.1 kg (300 lb 0.7 oz) 136.1 kg (300 lb) (!) 140.6 kg (310 lb)       Intake/Output Summary:  (Last 24 hours)    Intake/Output Summary (Last 24 hours) at 11/12/16 1113  Last data filed at 11/12/16 9604   Gross per 24 hour   Intake             1178 ml   Output             2025 ml   Net             -847 ml      Stool Occurrence: 1    Physical Exam  General Appearance: No apparent distress, sitting upright in chair, obese  Skin: no rashes  HEENT:???PERRL, MMM  Chest and Lungs:???Coarse/tight breath sounds posteriorly, +wheezing, +ronchi  Cardiovascular:???RRR  Abdomen:???soft/obese, nondistended, +BS  Genitourinary: voiding  Extremities:???warm, 1+ LE edema, venous stasis changes to BLE, Left Feet/ankle appear purple, cool-warm to touch. DP pulses palpable bilaterally  Neurologic:???alert &???oriented x3, able to move all extremities, non-focal      Lab Review  Pertinent labs reviewed personally    Point of Care Testing  (Last 24 hours)  Glucose: (!) 205 (11/12/16 0605) POC Glucose (Download): (!) 239 (11/12/16 1610)    Radiology and other Diagnostics Review (Tele/EKG/Echo):    Pertinent radiology reviewed.    Alda Berthold, MD

## 2016-11-12 NOTE — Patient Education
Medication Education    Ruben Wood accepted counseling and was receptive.  he needs reinforcement.    The following medications were discussed:  Emu oil, Lovenox, Mucinex, Novolog, Melatonin, Solu-medrol, singulair, Protonix, Senna    Where indicated, the patient was provided with additional medication and/or disease-state information.  All patient questions were answered and patient acknowledged understanding of the medications, side effects and other pertinent medication information.    Follow up should occur daily.    Continue to address: indications    Dyann Kief, RN

## 2016-11-12 NOTE — Consults
[place] [facility] Consult  11/12/2016     Patient: Ruben Wood.  MRN: [id number]    Admission Date:  11/07/2016, LOS: 5 days  Admission Diagnosis: Respiratory failure [facility]) [J96.90]  Date of Service: November 12, 2016    Reason for Consult: Evaluation of LLE poss PVD   Referring Provider: No att. providers found  Attending Surgeon: Dr. Justin Mend   Consult Performed by: Georga Hacking, MD    ASSESSMENT: 76 y.o. male with history of asthma, CKD, hypertension, CAD admitted for acute hypoxic respiratory failure. Consulted for venous stasis.     PLAN:  -Patient has history of venous stasis causing dermatitis and ulcer.  -No signs of arterial insufficiency requiring any surgical intervention.  -Recommend elevation of bilateral feet.  -Recommend SCDs, double-layered Tubigrip to decrease edema  -Recommend diuresis with Lasix 40 IV daily. Please monitor electrolytes, especially potassium closely. Patient may require potassium supplementation on discharge if on Lasix and Aldactone simultaneously.    Discussed plan of care with staff surgeon, Dr. Justin Mend, who directed plan of care  __________________________________________________________________________________    HPI: Ruben Kelch. is a 76 y.o. male with history of asthma, CKD, hypertension, CAD admitted for acute hypoxic respiratory failure for which she was started on antibiotics.  Vascular surgery was consulted for discoloration of bilateral feet with concern for vascular insufficiency.  Patient reports that he has been diagnosed with peripheral vascular disease and has been following with Dr. Junita Push on an outpatient basis.  Specifically, he has undergone bilateral saphenous endovenous ablation with Dr. Junita Push earlier this year.  He denies any pain in bilateral extremities, tingling, loss of sensation.  He does that his bilateral feet are cool to touch which is his baseline.    Arterial ultrasound of the bilateral lower extremities performed yesterday did not show any arterial insufficiency or any hemodynamically significant stenosis.    Past Medical History:   Diagnosis Date   ??? AAA (abdominal aortic aneurysm) (HCC) 10/15/2012   ??? Asthma    ??? Bronchiectasis (HCC)    ??? CAD (coronary artery disease) 08/15/2012    07/11/2002- Cath @ [facility] showed Severe double vessel disease(OMB of circumflex and distal circ)  inferior-basilar dyskinesis.                 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  09/12/12   Cath    ??? Cardiomyopathy (HCC) 07/19/2014   ??? Cellulitis of left lower extremity 11/12/2014    10/2014: admitted with cellulitis, [place] negative for venous clot, MRI without osteomyelitis.    11/12/2014 In clinic today, still with cellulitis.  Per patient and his wife, the erythema may be extending.  Cultures from drainage in hospital with MSSA, but Blood Cx negative.  No e/o osteomyelitis.  My concern is that this antibiotic regimen is not adequately treating his cellulitis.  We will broaden coverage to get MRSA with doxycycline.  Patient and wife given strict call/return criteria.  I will see patient in 3-4 days for re-evaluation.  No fever today.  Plan:  Stop cefpodoxime and start doxycylcine  11/17/2014 Much better, no warmth and erythema minimal.   Plan: continue doxycyline for full 10 day course.      ??? Chest wall deformity 07/09/2012    Chest wall reconstruction on 07/09/12    ??? CHF (congestive heart  failure) (HCC)    ??? 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 Chronic cough 08/29/2012   ??? 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 CKD (chronic kidney disease) stage 3, GFR 30-59 ml/min (HCC) 08/26/2013   ??? COPD (chronic obstructive pulmonary disease) (HCC) 08/13/2012   ??? Dyspnea    ??? Edema ??? Essential hypertension 08/13/2012   ??? GERD (gastroesophageal reflux disease) 08/13/2012   ??? History of CHF (congestive heart failure) 08/13/2012   ??? History of MRSA infection 10/14/2012   ??? 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    ??? HTN (hypertension)    ??? Hyperlipemia    ??? Hypertension 08/13/2012   ??? 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 Mixed dyslipidemia 08/26/2013   ??? Morbid obesity (HCC) 08/13/2012   ??? 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  Discussed the importance of diet.  Exercise as tolerated.      ??? Obesity    ??? On supplemental oxygen therapy    ??? OSA on CPAP 08/13/2012   ??? Pneumonia 04/2012    [facility]   ??? Seasonal allergic reaction    ??? Sleep apnea      No current facility-administered medications on file prior to encounter.      Current Outpatient Prescriptions on File Prior to Encounter   Medication Sig Dispense Refill   ??? acetaminophen (TYLENOL) 500 mg tablet Take 2 tablets by mouth three times daily. Max of 4,000 mg of acetaminophen in 24 hours. 30 tablet 1   ??? albuterol (PROAIR HFA, VENTOLIN HFA, OR PROVENTIL HFA) 90 mcg/actuation inhaler Inhale two puffs by mouth into the lungs every 4 hours as needed for Wheezing or Shortness of Breath. 1 Inhaler 0   ??? allopurinol (ZYLOPRIM) 300 mg tablet Take one tablet by mouth daily. 90 tablet 3   ??? AMITRIPTYLINE HCL (AMITRIPTYLINE/GABAPENTIN/EMU OIL(#)) 05/24/08 % Apply 5 g topically to affected area three times daily. 100 g 11   ??? ascorbic acid (VITAMIN C) 500 mg tablet Take one tablet by mouth daily. 90 tablet 3   ??? aspirin 325 mg tablet Take 325 mg by mouth daily. Take with food.     ??? atorvastatin (LIPITOR) 20 mg tablet TAKE ONE TABLET BY MOUTH ONCE DAILY 90 tablet 3   ??? benzonatate (TESSALON PERLES) 100 mg capsule Take one capsule by mouth three times daily as needed for Cough. 20 capsule 0   ??? budesonide/formoterol (SYMBICORT HFA) 160/4.5 mcg inhalation Inhale two puffs by mouth into the lungs twice daily. Rinse mouth after use. 3 Inhaler 3   ??? bumetanide (BUMEX) 2 mg tablet Take one tablet by mouth twice daily. 180 tablet 3   ??? cetirizine (ZYRTEC) 10 mg tablet Take 10 mg by mouth every morning.     ??? cholecalciferol (VITAMIN D-3) 1,000 units tablet Take 1,000  Units by mouth daily.     ??? codeine/guaiFENesin (ROBITUSSIN-AC) 10/100 mg/5 mL oral solution Take 5 mL by mouth every 4 hours as needed for Cough. 118 mL 0   ??? duloxetine DR (CYMBALTA) 60 mg capsule Take one capsule by mouth daily. 90 capsule 3   ??? ferrous sulfate (FEOSOL, FEROSUL) 325 mg (65 mg iron) tablet Take one tablet by mouth daily. Take on empty stomach at least 1 hr before or 2 hrs after food.Take w/ vitamin C 500mg  for better absorption 90 tablet 3   ??? Flunisolide 25 mcg (0.025 %) spry Apply 2 sprays to each nostril as directed twice daily. 25 mL 3   ??? gabapentin (NEURONTIN) 400 mg capsule Take one capsule by mouth every 8 hours. 270 capsule 3   ??? ipratropium bromide (ATROVENT) 42 mcg (0.06 %) nasal spray 1 spray to each nostril Q6H PRN 15 mL 12   ??? L.ACID/L.CASEI/B.BIF/B.LON/FOS (PROBIOTIC BLEND PO) Take 1 Cap by mouth.     ??? lutein-zeaxanthin 25-5 mg cap Take 1 capsule by mouth daily.     ??? metoprolol XL (TOPROL XL) 25 mg extended release tablet Take 1 tablet by mouth daily. 90 tablet 3   ??? montelukast (SINGULAIR) 10 mg tablet TAKE ONE TABLET BY MOUTH AT BEDTIME. MAINTENANCE THERAPY FOR ASTHMA. 30 Tab 0 ??? nebulizer compressor medical device Use as directed. 1 Device 0   ??? nitroglycerin (NITROSTAT) 0.4 mg tablet Place 1 Tab under tongue every 5 minutes as needed for Chest Pain. 25 Tab 1   ??? pantoprazole DR (PROTONIX) 40 mg tablet Take one tablet by mouth twice daily. 60 tablet 11   ??? ranitidine(+) (ZANTAC) 150 mg tablet TAKE ONE TABLET BY MOUTH ONCE DAILY 60 tablet 2   ??? senna/docusate (SENOKOT-S) 8.6/50 mg tablet Take 1 Tab by mouth twice daily. (Patient taking differently: Take 1 tablet by mouth daily as needed.) 60 Tab 0   ??? sildenafil(+) (VIAGRA) 50 mg tablet Take 1 tablet by mouth as Needed for Erectile dysfunction. 10 tablet 5   ??? spironolactone (ALDACTONE) 25 mg tablet Take 1 tablet by mouth twice daily. 180 tablet 3     Past Surgical History:   Procedure Laterality Date   ??? BACK SURGERY  Jan 2016   ??? VARICOSE VEIN SURGERY Left 01/05/2016    left small saphenous endovenous ablation with left leg microphlebectomy-Dr. Junita Push   ??? HX MICROPHLEBECTOMY Right 07/06/2016    [place]   ??? HX ENDOVENOUS ABLATION OF THE SMALL SAPHENOUS VEIN Right 07/06/2016    [place]   ??? BRONCHOSCOPY     ??? CARDIAC CATHERIZATION  several years ago    [place]   ??? DOPPLER ECHOCARDIOGRAPHY     ??? HERNIA REPAIR     ??? HX CHOLECYSTECTOMY     ??? HX HEART CATHETERIZATION     ??? HX LUMBAR LAMINECTOMY     ??? LUNG SURGERY       Family History   Problem Relation Age of Onset   ??? Cancer Mother         liver   ??? Stroke Sister    ??? Cancer Father    ??? Coronary Artery Disease Father      Social History     Social History   ??? Marital status: Married     Spouse name: Ruben Wood   ??? Number of children: 0   ??? Years of education: N/A     Occupational History   ??? former Warden/ranger Retired  lives in a 77 year old house with wife     Social History Main Topics   ??? Smoking status: Former Smoker     Packs/day: 2.00     Years: 40.00     Quit date: 07/08/2000   ??? Smokeless tobacco: Never Used   ??? Alcohol use Yes      Comment: on occasion 1-2 drinks/week ??? Drug use: No   ??? Sexual activity: No     Other Topics Concern   ??? Not on file     Social History Narrative   ??? No narrative on file       ROS:  Review of Systems   Constitutional: Negative.    HENT: Negative.    Eyes: Negative.    Respiratory: Positive for shortness of breath.    Cardiovascular: Positive for leg swelling.   Gastrointestinal: Negative.    Genitourinary: Negative.    Musculoskeletal: Negative.    Skin: Positive for itching.   Neurological: Negative.    Endo/Heme/Allergies: Negative.    Psychiatric/Behavioral: Negative.    All other systems reviewed and are negative.      Vitals:  BP: (117-133)/(62-83)   Temp:  [36.4 ???C (97.5 ???F)-36.6 ???C (97.8 ???F)]   Pulse:  [74-99]   Respirations:  [18 PER MINUTE-22 PER MINUTE]   SpO2:  [92 %-98 %]   O2 Delivery: Nasal Cannula    Constitutional: well-developed and well-nourished.    Head: Normocephalic and atraumatic.   Eyes: EOM are normal.   Neck: Normal range of motion. Neck supple.   Cardiovascular: Normal rate.    Pulmonary/Chest: Effort normal.   Abdominal: soft, ND, NT   Musculoskeletal: Normal range of motion.   Neurological: alert and oriented   Skin: B/l LE with venous stasis dermatitis, brisk capillary refill. Left foot cooler to touch compared to right foot. Strong palpable DP/PT bilaterally. 2+ pitting edema bilateral.   Psychiatric: Normal mood and affect. Behavior is normal. Judgment and thought content normal.     Lab/Radiology/Other Diagnostic Tests:  Results for orders placed or performed during the hospital encounter of 11/07/16 (from the past 24 hour(s))   POC GLUCOSE    Collection Time: 11/11/16  5:59 PM   # # Low-High    Glucose, POC 174 (H) 70 - 100 MG/DL   POC GLUCOSE    Collection Time: 11/11/16  9:43 PM   # # Low-High    Glucose, POC 299 (H) 70 - 100 MG/DL   CBC AND DIFF    Collection Time: 11/12/16  6:05 AM   # # Low-High    White Blood Cells 8.6 4.5 - 11.0 K/UL    RBC 3.73 (L) 4.4 - 5.5 M/UL    Hemoglobin 13.1 (L) 13.5 - 16.5 GM/DL Hematocrit 16.1 (L) 40 - 50 %    MCV 104.7 (H) 80 - 100 FL    MCH 35.2 (H) 26 - 34 PG    MCHC 33.6 32.0 - 36.0 G/DL    RDW 09.6 (H) 11 - 15 %    Platelet Count 146 (L) 150 - 400 K/UL    MPV 8.4 7 - 11 FL    Neutrophils 93 (H) 41 - 77 %    Lymphocytes 4 (L) 24 - 44 %    Monocytes 2 (L) 4 - 12 %    Eosinophils 1 0 - 5 %    Basophils 0 0 - 2 %    Absolute Neutrophil Count 8.00 (H) 1.8 - 7.0 K/UL  Absolute Lymph Count 0.40 (L) 1.0 - 4.8 K/UL    Absolute Monocyte Count 0.20 0 - 0.80 K/UL    Absolute Eosinophil Count 0.00 0 - 0.45 K/UL    Absolute Basophil Count 0.00 0 - 0.20 K/UL   COMPREHENSIVE METABOLIC PANEL    Collection Time: 11/12/16  6:05 AM   # # Low-High    Sodium 137 137 - 147 MMOL/L    Potassium 4.1 3.5 - 5.1 MMOL/L    Chloride 99 98 - 110 MMOL/L    Glucose 205 (H) 70 - 100 MG/DL    Blood Urea Nitrogen 56 (H) 7 - 25 MG/DL    Creatinine 1.61 (H) 0.4 - 1.24 MG/DL    Calcium 8.3 (L) 8.5 - 10.6 MG/DL    Total Protein 5.8 (L) 6.0 - 8.0 G/DL    Total Bilirubin 1.2 0.3 - 1.2 MG/DL    Albumin 3.4 (L) 3.5 - 5.0 G/DL    Alk Phosphatase 54 25 - 110 U/L    AST (SGOT) 22 7 - 40 U/L    CO2 27 21 - 30 MMOL/L    ALT (SGPT) 23 7 - 56 U/L    Anion Gap 11 3 - 12    eGFR Non African American 55 (L) >60 mL/min    eGFR African American >60 >60 mL/min   MAGNESIUM    Collection Time: 11/12/16  6:05 AM   # # Low-High    Magnesium 2.3 1.6 - 2.6 mg/dL   PHOSPHORUS    Collection Time: 11/12/16  6:05 AM   # # Low-High    Phosphorus 3.2 2.0 - 4.5 MG/DL   POC GLUCOSE    Collection Time: 11/12/16  7:58 AM   # # Low-High    Glucose, POC 239 (H) 70 - 100 MG/DL   POC GLUCOSE    Collection Time: 11/12/16 11:59 AM   # # Low-High    Glucose, POC 145 (H) 70 - 100 MG/DL       Georga Hacking, MD  Personal Pager: # [phone]  Team Pager: [phone]

## 2016-11-12 NOTE — Progress Notes
Cardiology Daily Progress Note  11/12/2016    Ruben Wood.            Admission Date:  11/07/2016                     Assessment/Plan:    Active Problems:    OSA on CPAP    GERD (gastroesophageal reflux disease)    CAD (coronary artery disease)    AAA (abdominal aortic aneurysm) (HCC)    CKD (chronic kidney disease) stage 3, GFR 30-59 ml/min (HCC)    Chronic combined systolic and diastolic congestive heart failure, NYHA class 2 (HCC)    Chronic obstructive pulmonary disease (HCC)    Ischemic cardiomyopathy    Moderate persistent asthma with acute exacerbation    Iron deficiency    Acute respiratory failure with hypoxia (HCC)    Severe sepsis (HCC)    Lactic acid acidosis    Hyperglycemia    Rhinovirus infection        1. Ac on ch diast HF  Current echo showing an EF of 50%. The IVC was significantly dilated.  Has a normally functioning bi-V ICD in place.  Net -ve 11 l so far. BPs, BUN creat stable    -Continue Bumex 3 mg twice daily  - need daily standing weights    2.  Coronary disease, with chronic occlusion of mid circumflex, OM and RCA.  He is on aspirin, statins, low-dose beta-blockers.      -Continue medical treatment.    3. PAD, h/o AAA repair    -Consider vasc eval for the cool L foot/toes      4. NSVT-   no symptoms    __________________________________________________________________________________    Subjective:  Says the breathing is better. Cough persists. No palps, dizziness or cp.    Allergies:  Other [unclassified drug]    Medications:  Scheduled Meds:    albuterol 0.5% (PROVENTIL; VENTOLIN) nebulizer solution 2.5 mg 2.5 mg Inhalation QID & PRN   allopurinol (ZYLOPRIM) tablet 300 mg 300 mg Oral QDAY   amitriptyline/gabapentin/emu oil(#) 05/24/08 % topical cream 5 g 5 g Topical TID   amoxicillin/K clavulanate (AUGMENTIN) tablet 875 mg 875 mg Oral BID w/meals   ascorbic acid (VITAMIN C) tablet 500 mg 500 mg Oral QDAY   aspirin tablet 325 mg 325 mg Oral QDAY atorvastatin (LIPITOR) tablet 20 mg 20 mg Oral QDAY   budesonide/formoterol (SYMBICORT HFA) 160/4.5 mcg inhalation 2 puff 2 puff Inhalation BID   bumetanide (BUMEX) tablet 3 mg 3 mg Oral BID(9-17)   cetirizine (ZYRTEC) tablet 10 mg 10 mg Oral QAM8   cholecalciferol (VITAMIN D-3) tablet 1,000 Units 1,000 Units Oral QDAY   duloxetine DR (CYMBALTA) capsule 60 mg 60 mg Oral QDAY   enoxaparin (LOVENOX) syringe 40 mg 40 mg Subcutaneous BID   ferrous sulfate (FEOSOL, FEROSUL) tablet 325 mg 325 mg Oral QDAY   gabapentin (NEURONTIN) capsule 400 mg 400 mg Oral Q8H*   guaiFENesin LA (MUCINEX) tablet 600 mg 600 mg Oral BID   insulin aspart U-100 (NOVOLOG FLEXPEN) injection PEN 0-28 Units 0-28 Units Subcutaneous ACHS   ipratropium bromide (ATROVENT) 0.02 % nebulizer solution 0.5 mg 0.5 mg Inhalation QID & PRN   melatonin tablet 3 mg 3 mg Oral QHS   methylPREDNISolone (SOLU-MEDROL PF) injection 62.5 mg 62.5 mg Intravenous Q8H   metoprolol XL (TOPROL XL) tablet 25 mg 25 mg Oral QDAY   montelukast (SINGULAIR) tablet 10 mg 10 mg Oral QHS  pantoprazole DR (PROTONIX) tablet 40 mg 40 mg Oral BID   polyethylene glycol 3350 (MIRALAX) packet 17 g 1 packet Oral QDAY   senna/docusate (SENOKOT-S) tablet 1 tablet 1 tablet Oral BID   spironolactone (ALDACTONE) tablet 25 mg 25 mg Oral BID(9-17)   Continuous Infusions:  PRN and Respiratory Meds:acetaminophen Q6H PRN, benzonatate TID PRN, calcium gluconate  IV PRN (On Call from Rx) **AND** Ionized Calcium PRN **AND** Notify Physician Ongoing, codeine/guaiFENesin Q4H PRN, magnesium sulfate PRN **AND** Magnesium PRN **AND** Notify Physician Ongoing, ondansetron Q6H PRN, potassium chloride SR PRN **OR** potassium chloride PRN, sodium phosphate  IVPB PRN (On Call from Rx) **AND** Phosphorus PRN **AND** Notify Physician Ongoing       Physical Exam:  Vital Signs: Last Filed In 24 Hours Vital Signs: 24 Hour Range   BP: 133/66 (09/23 0824)  Temp: 36.4 ???C (97.5 ???F) (09/23 1610)  Pulse: 92 (09/23 0840) Respirations: 20 PER MINUTE (09/23 0824)  SpO2: 98 % (09/23 0840)  O2 Delivery: Nasal Cannula (09/23 0824)  SpO2 Pulse: 86 (09/22 1600) BP: (110-133)/(53-83)   Temp:  [36.4 ???C (97.5 ???F)-36.6 ???C (97.8 ???F)]   Pulse:  [74-99]   Respirations:  [16 PER MINUTE-22 PER MINUTE]   SpO2:  [91 %-98 %]   O2 Delivery: Nasal Cannula   Intensity Pain Scale (Self Report): 0 (11/11/16 1200)    Intake/Output Summary: (Last 24 hours)    Intake/Output Summary (Last 24 hours) at 11/12/16 1048  Last data filed at 11/12/16 0824   Gross per 24 hour   Intake             1178 ml   Output             2625 ml   Net            -1447 ml       Physical Exam   General Appearance: well developed, well nourished, persisting cough  Neck Veins: neck veins are not distended   Chest Inspection: chest is normal in appearance   Respiratory Effort: no acute distress   Auscultation/Percussion: lungs with ongoing bilat wheezing, coarse   Cardiac Rhythm: regular rhythm and slightly tachy   Cardiac Auscultation: S1, S2 normal, no rub, no gallop   Murmurs: no murmur   Carotid Arteries: no bruits   Radial Arteries: normal symmetric radial pulses   Lower Extremity: no lower extremity edema, discolored and cool L toes, able to move toes, but sensation impaired over medial 4   Abdominal Exam: soft, non-tender, non-distended, normal bowel sounds   Neurologic Exam: neurological assessment grossly intact   Other: moves all extremities    Laboratory Review:    Hematology:    Lab Results   Component Value Date    HGB 13.1 11/12/2016    HCT 39.1 11/12/2016    PLTCT 146 11/12/2016    WBC 8.6 11/12/2016    NEUT 93 11/12/2016    ANC 8.00 11/12/2016    ALC 0.40 11/12/2016    MONA 2 11/12/2016    AMC 0.20 11/12/2016    ABC 0.00 11/12/2016    MCV 104.7 11/12/2016    MCHC 33.6 11/12/2016    MPV 8.4 11/12/2016    RDW 17.2 11/12/2016    and General Chemistry:    Lab Results   Component Value Date    NA 137 11/12/2016    K 4.1 11/12/2016    CL 99 11/12/2016    GAP 11 11/12/2016 BUN 56 11/12/2016    CR  1.28 11/12/2016    GLU 205 11/12/2016    CA 8.3 11/12/2016    ALBUMIN 3.4 11/12/2016    LACTIC 0.9 10/25/2012    OBSCA 1.03 10/26/2012    MG 2.3 11/12/2016    TOTBILI 1.2 11/12/2016     bnp 92  Trop -ve  ECG: A sense V pace  Telemetry: nsr, V paced, pvcs slow VT     Jesse Sans, MD

## 2016-11-12 NOTE — Patient Education
Medication Education    Arty Gradillas accepted counseling and was interactive.  he verbalized understanding.    The following medications were discussed:  Novolog  Amoxicillin  Zyrtec  Solumedrol  Gabapentin  Bumex  Spironolactone  Zyloprim  Vitamin C  Aspirin  Lipitor  Vitamin D-3  Cymbalta  Lovenox  Mucinex  Metoprolol  Protonix  Miralax  Senokot      Where indicated, the patient was provided with additional medication and/or disease-state information.  All patient questions were answered and patient acknowledged understanding of the medications, side effects and other pertinent medication information.    Follow up should occur daily.    Continue to address: indications    Gwynn Burly, RN

## 2016-11-13 LAB — CULTURE-BLOOD W/SENSITIVITY

## 2016-11-13 LAB — MAGNESIUM: Lab: 2.3 mg/dL — ABNORMAL LOW (ref >60)

## 2016-11-13 LAB — POC GLUCOSE
Lab: 238 mg/dL — ABNORMAL HIGH (ref 70–100)
Lab: 185 mg/dL — ABNORMAL HIGH (ref 70–100)
Lab: 179 mg/dL — ABNORMAL HIGH (ref 70–100)
Lab: 189 mg/dL — ABNORMAL HIGH (ref 70–100)

## 2016-11-13 LAB — COMPREHENSIVE METABOLIC PANEL: Lab: 134 MMOL/L — ABNORMAL LOW (ref 137–147)

## 2016-11-13 LAB — CBC AND DIFF: Lab: 9.9 K/UL — ABNORMAL LOW (ref 4.5–11.0)

## 2016-11-13 LAB — PHOSPHORUS: Lab: 2.8 mg/dL — ABNORMAL LOW (ref >60)

## 2016-11-13 MED ORDER — FLU VACC TS2018-19 65YR UP(PF) 180 MCG/0.5 ML IM SYRG
.5 mL | Freq: Once | INTRAMUSCULAR | 0 refills | Status: CP
Start: 2016-11-13 — End: ?
  Administered 2016-11-13: 22:00:00 0.5 mL via INTRAMUSCULAR

## 2016-11-13 NOTE — Progress Notes
Pulmonary Progress Note         Name:  Ruben Wood.   Today's Date:  11/13/2016  Admission Date: 11/07/2016  LOS: 6 days           Impression:   76 y/o M???w/ PMH depression, chronic pain, asthma, CKD, HTN, HLD, CAD, ischemic cardiomyopathy s/p pacer, ICD &???cardioMEMS implant. Presented 9/18 in transfer from [facility] w/ acute hypoxic???respiratory failure. Presented there 9/13 w/ persistent cough causing SOA &???was treated w/ levaquin &???steroid. On 9/18 had progressive increase in O2 requirement &???placed on BiPAP prior to transfer. RVP +rhinovirus &???sputum gram stain w/???many GPC resembling strep (despite being on levaquin).    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 [name] on 10/03/16  - CXR 9/18- right basilar consolidation likely pericardial fat    OSA  - CPAP qhs    Severe Sepsis (resolved), Rhinovirus  - Has had waxing/waning cough for the last month - hx allergic rhinitis & asthma but not improved -> had been given a z-pak 8/21 & then started on doxy 9/10 which he continued till admission to OSH 9/13  - At OSH, he received levaquin 9/13, and then daily starting 9/16  - WBC 9.9???this AM; afebrile  - UA w/o evidence of infection  - Procal 0.08  - RVP: +rhinovirus  - Blood cx 9/18: ngtd5  - Sputum cx 9/18: GS with many GPC resembling strep; cx normal flora  - Continue augmentin PO to complete 7d through 9/26 Recommendations:     - Continue IV solu-medrol 62.5mg  Q8 hours for another day with plans to switch to oral tomorrow if he continues to improve  - Continue Augmentin x 7 days (last dose 9/26)  - Continue to wean O2 as able  - Continue Symbicort and duonebs  - Aggressive pulmonary hygiene with IS and Aerobika   - Diuretics per cardiology         >> Cardio MEMS with PA diastolic 9 today    We will continue to follow. Above plan discussed with attending physician, Dr. Pincus Sanes, APRN-NP  Pager [phone]  ________________________________________________________________________    Subjective  Patient continues to have shortness of breath, cough, difficulty in bringing up sputum. Feels like he is getting better and wants to go home as early as he can.     ROS: Denies fever, chills, dizziness, nausea, vomiting and diarrhea    Objective  Vital Signs: Last Filed Vital Signs: 24 Hour Range   BP: 142/70 (09/24 1139)  Temp: 36.6 ???C (97.9 ???F) (09/24 1139)  Pulse: 100 (09/24 1139)  Respirations: 22 PER MINUTE (09/24 1139)  SpO2: 95 % (09/24 1139)  O2 Delivery: Nasal Cannula (09/24 1139) BP: (105-153)/(61-73)   Temp:  [36.4 ???C (97.6 ???F)-36.8 ???C (98.2 ???F)]   Pulse:  [80-100]   Respirations:  [18 PER MINUTE-22 PER MINUTE]   SpO2:  [92 %-99 %]  O2 Delivery: Nasal Cannula       Intake/Output Summary:  (Last 24 hours)    Intake/Output Summary (Last 24 hours) at 11/13/16 1301  Last data filed at 11/13/16 1139   Gross per 24 hour   Intake              480 ml   Output             3700 ml   Net            -3220 ml       General: Awake and alert, in no acute distress.  HEENT: NC/AT, sclera non-icteric, moist mucus membranes.  Neck: no JVD.  CV: regular rhythm, normal rate, no murmurs.  Lungs: Breath sounds + rhonchi and wheeze, normal wob, on 4LNC  Abdomen: soft, non-tender, non-distended, normo-active bowel sounds  Extremities: 1-2+ BLE, cool to touch, pedal pulses 2 (+) bilaterally  Neuro: no focal deficits  Skin: no rashes Lab Review  Recent CBC   Recent Labs      11/11/16   0340  11/12/16   0605  11/13/16   0523   WBC  9.4  8.6  9.9   HGB  13.4*  13.1*  14.2   HCT  39.2*  39.1*  42.4   PLTCT  184  146*  155   MCV  104.5*  104.7*  105.2*        Recent CMP   Recent Labs      11/11/16   0340  11/12/16   0605  11/13/16   0523   NA  137  137  134*   K  4.0  4.1  4.1   CL  101  99  96*   CO2  26  27  27    GAP  10  11  11    BUN  56*  56*  57*   CR  1.29*  1.28*  1.29*   GFR  54*  55*  54*   GLU  193*  205*  211*   CA  8.0*  8.3*  8.7   MG  2.4  2.3  2.3   PO4  3.0  3.2  2.8   ALBUMIN  3.5  3.4*  3.6   ALKPHOS  59  54  58   AST  25  22  22    ALT  22  23  29    TOTBILI  0.9  1.2  1.2        Other labs   24-hour labs:    Results for orders placed or performed during the hospital encounter of 11/07/16 (from the past 24 hour(s))   POC GLUCOSE    Collection Time: 11/12/16  6:02 PM   Result Value Ref Range    Glucose, POC 219 (H) 70 - 100 MG/DL   POC GLUCOSE    Collection Time: 11/12/16  8:59 PM   Result Value Ref Range    Glucose, POC 238 (H) 70 - 100 MG/DL   CBC AND DIFF    Collection Time: 11/13/16  5:23 AM   Result Value Ref Range    White Blood Cells 9.9 4.5 - 11.0 K/UL    RBC 4.03 (L) 4.4 - 5.5 M/UL    Hemoglobin 14.2 13.5 - 16.5 GM/DL    Hematocrit 54.0 40 - 50 %    MCV 105.2 (H) 80 - 100 FL    MCH 35.2 (H) 26 - 34 PG    MCHC  33.4 32.0 - 36.0 G/DL    RDW 82.9 (H) 11 - 15 %    Platelet Count 155 150 - 400 K/UL    MPV 8.7 7 - 11 FL    Neutrophils 93 (H) 41 - 77 %    Lymphocytes 4 (L) 24 - 44 %    Monocytes 3 (L) 4 - 12 %    Eosinophils 0 0 - 5 %    Basophils 0 0 - 2 %    Absolute Neutrophil Count 9.20 (H) 1.8 - 7.0 K/UL    Absolute Lymph Count 0.40 (L) 1.0 - 4.8 K/UL    Absolute Monocyte Count 0.30 0 - 0.80 K/UL    Absolute Eosinophil Count 0.00 0 - 0.45 K/UL    Absolute Basophil Count 0.00 0 - 0.20 K/UL   COMPREHENSIVE METABOLIC PANEL    Collection Time: 11/13/16  5:23 AM   Result Value Ref Range    Sodium 134 (L) 137 - 147 MMOL/L Potassium 4.1 3.5 - 5.1 MMOL/L    Chloride 96 (L) 98 - 110 MMOL/L    Glucose 211 (H) 70 - 100 MG/DL    Blood Urea Nitrogen 57 (H) 7 - 25 MG/DL    Creatinine 5.62 (H) 0.4 - 1.24 MG/DL    Calcium 8.7 8.5 - 13.0 MG/DL    Total Protein 6.0 6.0 - 8.0 G/DL    Total Bilirubin 1.2 0.3 - 1.2 MG/DL    Albumin 3.6 3.5 - 5.0 G/DL    Alk Phosphatase 58 25 - 110 U/L    AST (SGOT) 22 7 - 40 U/L    CO2 27 21 - 30 MMOL/L    ALT (SGPT) 29 7 - 56 U/L    Anion Gap 11 3 - 12    eGFR Non African American 54 (L) >60 mL/min    eGFR African American >60 >60 mL/min   MAGNESIUM    Collection Time: 11/13/16  5:23 AM   Result Value Ref Range    Magnesium 2.3 1.6 - 2.6 mg/dL   PHOSPHORUS    Collection Time: 11/13/16  5:23 AM   Result Value Ref Range    Phosphorus 2.8 2.0 - 4.5 MG/DL   POC GLUCOSE    Collection Time: 11/13/16  7:31 AM   Result Value Ref Range    Glucose, POC 185 (H) 70 - 100 MG/DL   POC GLUCOSE    Collection Time: 11/13/16 12:20 PM   Result Value Ref Range    Glucose, POC 179 (H) 70 - 100 MG/DL        Point of Care Testing  (Last 24 hours)  Glucose: (!) 211 (11/13/16 0523)  POC Glucose (Download): (!) 179 (11/13/16 1220)    Radiology and other Diagnostics Review:    CXR 9/18- reviewed    Medications  Scheduled IV PRN     albuterol 0.5% (PROVENTIL; VENTOLIN) nebulizer solution 2.5 mg 2.5 mg Inhalation QID & PRN   allopurinol (ZYLOPRIM) tablet 300 mg 300 mg Oral QDAY   amitriptyline/gabapentin/emu oil(#) 05/24/08 % topical cream 5 g 5 g Topical TID   amoxicillin/K clavulanate (AUGMENTIN) tablet 875 mg 875 mg Oral BID w/meals   ascorbic acid (VITAMIN C) tablet 500 mg 500 mg Oral QDAY   aspirin tablet 325 mg 325 mg Oral QDAY   atorvastatin (LIPITOR) tablet 20 mg 20 mg Oral QDAY   budesonide/formoterol (SYMBICORT HFA) 160/4.5 mcg inhalation 2 puff 2 puff Inhalation BID   bumetanide (BUMEX) tablet 3 mg 3 mg  Oral BID(9-17)   cetirizine (ZYRTEC) tablet 10 mg 10 mg Oral QAM8 cholecalciferol (VITAMIN D-3) tablet 1,000 Units 1,000 Units Oral QDAY   duloxetine DR (CYMBALTA) capsule 60 mg 60 mg Oral QDAY   enoxaparin (LOVENOX) syringe 40 mg 40 mg Subcutaneous BID   ferrous sulfate (FEOSOL, FEROSUL) tablet 325 mg 325 mg Oral QDAY   gabapentin (NEURONTIN) capsule 400 mg 400 mg Oral Q8H*   guaiFENesin LA (MUCINEX) tablet 600 mg 600 mg Oral BID   insulin aspart U-100 (NOVOLOG FLEXPEN) injection PEN 0-28 Units 0-28 Units Subcutaneous ACHS   ipratropium bromide (ATROVENT) 0.02 % nebulizer solution 0.5 mg 0.5 mg Inhalation QID & PRN   melatonin tablet 3 mg 3 mg Oral QHS   methylPREDNISolone (SOLU-MEDROL PF) injection 62.5 mg 62.5 mg Intravenous Q8H   metoprolol XL (TOPROL XL) tablet 25 mg 25 mg Oral QDAY   montelukast (SINGULAIR) tablet 10 mg 10 mg Oral QHS   pantoprazole DR (PROTONIX) tablet 40 mg 40 mg Oral BID   polyethylene glycol 3350 (MIRALAX) packet 17 g 1 packet Oral QDAY   senna/docusate (SENOKOT-S) tablet 1 tablet 1 tablet Oral BID   spironolactone (ALDACTONE) tablet 25 mg 25 mg Oral BID(9-17)     acetaminophen Q6H PRN, benzonatate TID PRN, codeine/guaiFENesin Q4H PRN, ondansetron Q6H PRN

## 2016-11-13 NOTE — Case Management (ED)
Case Management Progress Note    NAME:Ruben Wood.                          MRN: [id number]              DOB:1940/04/27          AGE: 76 y.o.  ADMISSION DATE: 11/07/2016             DAYS ADMITTED: LOS: 6 days      Today???s Date: 11/13/2016    Plan    *Pt to potentially DC Wednesday and may require an increase in home O2, with HH PT and RW, per PT recs.          Interventions  ? Support   Support: Pt/Family Updates re:POC or DC Plan     * NCM reviewed EMR and discussed patient with team during huddle this afternoon.  * Pt may require an increase in home O2 at DC. ExOx to be ordered and completed tomorrow, 11/14/2016.   * No notation in EMR of who home O2 provider is. NCM contacted wife, [name]: [phone] and she stated that the provider is Keenan Bachelor  P: [phone] / F: [phone]. NCM to send updated home O2 needs once ExOx is completed.  * NCM discussed PT recs for home health and that patient will likely need a RW at DC. [name] stated, He doesn't need any of that. He just hasn't been as active here as what he is at home and nobody has really worked with him. [name] stated he does go to OP PT at [facility] as well as water aerobics and feels this is adequate. NCM discussed the need for RW, per PT recs and [name] stated, he doesn't need that either. He has several at home. NCM asked when he obtained RW and she stated she didn't know but shouldn't have to pay for another if he already has several. NCM stated that the RW would be paid through insurance as long as pt hasn't received the RW through insurance within the last 5 years. [name] stated he paid for it out of his own pocket but he certainly doesn't need another one and then stated, you're insinuating that I don't take care of him. NCM offered support and stated that it's standard procedure to offer support prior to discharge to all patients as it reduces the risk of readmission and also helps assist the patient to be successful at home post DC by setting patient up with what he needs going home. [name] stated, that's basically saying I'm not taking care of him. She also reports that she's had plenty of experience with home health through multiple family members and it's worthless. NCM encouraged [name] to reach out should she change her mind and want the extra support for her husband. [name] replied, don't count on it.  * NCM to discuss DC planning with patient and see what pt is comfortable with at DC. NCM to update [facility] [place] of possible increased O2 needs at DC.  * Will continue to follow with Medical team regarding DC planning.           ? Info or Referral      ? Discharge Planning   Discharge Planning: Durable Medical Equipment and Supplies, Home Health  ? Medication Needs      ? Financial      ? Legal      ?  Other        Disposition  ? Expected Discharge Date    Expected Discharge Date: 11/14/16  ? Transportation   Does the patient need discharge transport arranged?: No  Transportation Name, Phone and Availability #1: [name] - wife [phone]  Does the patient use Medicaid Transportation?: No  ? Next Level of Care (Acute Psych discharges only)      ? Discharge Disposition      Durable Medical Equipment     No service has been selected for the patient.      [place] [facility]     No service has been selected for the patient.      [facility]     No service has been selected for the patient.      [facility]     No service has been selected for the patient.          Clent Demark, RN, BSN  Nurse Case Manager  Hematology / Surgical Non-Transplant  Phone: [phone]  Pager:  [phone]  Fax:  [phone]

## 2016-11-13 NOTE — Progress Notes
General Progress Note      Today's Date:  11/13/2016  Admission Date: 11/07/2016  LOS: 6 days                     Assessment/Plan:    Active Problems:    OSA on CPAP    GERD (gastroesophageal reflux disease)    CAD (coronary artery disease)    AAA (abdominal aortic aneurysm) (HCC)    CKD (chronic kidney disease) stage 3, GFR 30-59 ml/min (HCC)    Chronic combined systolic and diastolic congestive heart failure, NYHA class 2 (HCC)    Chronic obstructive pulmonary disease (HCC)    Ischemic cardiomyopathy    Moderate persistent asthma with acute exacerbation    Iron deficiency    Acute respiratory failure with hypoxia (HCC)    Severe sepsis (HCC)    Lactic acid acidosis    Hyperglycemia    Rhinovirus infection      76 y/o M???w/ PMH depression, chronic pain, asthma, CKD, HTN, HLD, CAD, ischemic cardiomyopathy s/p pacer, ICD &???cardioMEMS implant. Presented 9/18 in transfer from [facility] w/ acute hypoxic???respiratory failure. Presented there 9/13 w/ persistent cough causing SOA &???was treated w/ levaquin &???steroid. On 9/18 had progressive increase in O2 requirement &???placed on BiPAP prior to transfer to [facility]. RVP +rhinovirus &???sputum gram stain w/???many GPC resembling strep (despite being on levaquin). Have narrowed antbx to augmentin to complete 7d. Remains on 4L HFNC w/???significant cough; continuing solumedrol, duonebs &???diuresis. Consulted heart failure team &???increased Bumex while on IV steroids. He transferred to the floor on 9/23.    Acute Respiratory Failure w/ Hypoxia; Cough   Severe Sepsis, Rhinovirus  Hx Allergic Asthma; Bronchiectasis   - Multifactorial 2/2 viral/bacterial infection vs asthma exacerbation vs bronchitis vs volume overload (w/ recent decrease in diuretic dosing ~8/21)  - Follows w/ Dr. 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 - Has been on Zyflo and Xolair in the past; not currently  - Was given z-pak & 5 day course of pred 8/14; then 9/10 given doxycycline & presented to ED at OSH 9/13 w/ SOB  - Tx w/ levaquin & methylpred (progressive increase in dose)  - OSH BNP 1200 on 9/18 (up from 670 on 9/13)  - Had progressive increase in O2 up to 12LNC &???then BiPAP  - On admit, NIV 8/5 60%  - ABG on admit: 7.41/39/115/24.8  - BNP 92  - RVP: +rhinovirus  - Blood cx 9/18: ngtd  - Sputum cx 9/18: GS with many GPC resembling strep; cx normal flora  - CXR w/ development of right basilar consolidation -> when compared to CT - ? percardial fat  Plan  - Given normalization of PA pressures, resumed PTA diuretics as below (w/ increased bicarb)  - Continue methylpred 62.5mg  q8h->possible transition to 60mg  prednisone today   - Continue PTA duonebs & symbicort/singulair  - Continue PTA tessalon perles, zyrtec, & guaifenesin w/ codeine, mucinex, acapella  - Pulmonary following  - Continue augmentin PO to complete 7d through 9/25  - will order ex ox in AM to inquire about home o2 needs, patient has Oxygen at home but was only using prn    Combined Systolic & Diastolic Heart Failure; HTN, HLD  CAD & Ischemic Cardiomyopathy s/p Pacemaker, CRT-D  - Echo 05/04/16: LVEF 40% (had been as  low as 20%), RV w/ borderline low EF, LA mildly dilated, RA normal; mild-mod MR, mild TR  - Of note, bumex had been increased in June & then reduced from 4mg  BID to 2mg  BID on 8/21 due to elevations in BUN/Cr  - BNP 92  - EKG paced rhythm, rate 90s, trop negative  - Repeat echo 9/18: LVEF 45-50%; RV normal function; mild to mod MR; no pericardial effusion  - Bubble study negative 9/19  - CardioMEMS read on 9/17 PAP 42/20 (baseline appears 20s over ~10)  - CardioMEMS read on 9/20???PAP 22/7 (baseline)???-->???cardiology will recheck daily  - ICD interrogated 9/21  - SBP 120s-130s, HR 70s-80s  Plan  - Continue PTA lipitor & ASA  - Continue PTA metoprolol XL 25mg  daily - Increased PTA Bumex to 3mg  BID while receiving IV steroids???&???spironolactone 25mg  BID  - 1.5L fluid restrirction  - Heart failure following   - Will arrange for HF follow up at time of discharge  - net - in last 24 hours    LE Swelling/Discoloration (LLE>RLE)  - Pt reports he has had vascular surgery to BLE  - Lower legs/feet erythematous & purple. 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 Patent trifurcation arteries to the ankle bilaterally.  - evaluated by vascular surgery on 9/23->no signs of arterial insufficiency requiring surgical intervention.  Double layered tubigrip recommended along with diuresis.      Hyperglycemia  - In setting of steroid  - BS mid-high 100s-200s???last 24 hrs  - Continue HDCF (36u/24h)  - Should improve when off steroids    Iron Deficiency Anemia  - Iron studies 8/17: iron 111, % sat low 22, TIBC high 499, ferritin 38  - S/p IV iron infusion 8/21  - Hgb 14.2 today  - Continue???PTA iron    AAA  - S/p endovascular repair  - Follows w/???Dr. Chales Abrahams    OSA  - CPAP qHS -->has been on bipap here qHS 8/5 50%    Depression  - Continue PTA cymbalta     Chronic Low Back Pain  - Follow[facility] at [facility]  - 2/2 falling off roof; has had epidural injections in the past  - Continue PTA amitriptyline/gabapentin/emu oil, gabapentin, & lidoderm patches  - Holding PTA tylenol 3, oxycodone, tizanidine    DVT PPX: Lovenox  Lines/Foley: PIV  Code Status: Full Code  Dispo: inpt    Isidoro Donning, MD  [facility]  [phone]    ________________________________________________________________________    Subjective  Ruben Wood. is a 76 y.o. male.  Patient states he continues to feel better.  States he walked around in the room yesterday and did not feel worsening dyspnea.  States would like to go home soon if possible.  He lives in  [place].  He has a roller walker at home already.      Review of Systems:  All other systems reviewed and are negative. except for dyspnea and wheezing    Medications  Scheduled Meds:    albuterol 0.5% (PROVENTIL; VENTOLIN) nebulizer solution 2.5 mg 2.5 mg Inhalation QID & PRN   allopurinol (ZYLOPRIM) tablet 300 mg 300 mg Oral QDAY   amitriptyline/gabapentin/emu oil(#) 05/24/08 % topical cream 5 g 5 g Topical TID   amoxicillin/K clavulanate (AUGMENTIN) tablet 875 mg 875  mg Oral BID w/meals   ascorbic acid (VITAMIN C) tablet 500 mg 500 mg Oral QDAY   aspirin tablet 325 mg 325 mg Oral QDAY   atorvastatin (LIPITOR) tablet 20 mg 20 mg Oral QDAY   budesonide/formoterol (SYMBICORT HFA) 160/4.5 mcg inhalation 2 puff 2 puff Inhalation BID   bumetanide (BUMEX) tablet 3 mg 3 mg Oral BID(9-17)   cetirizine (ZYRTEC) tablet 10 mg 10 mg Oral QAM8   cholecalciferol (VITAMIN D-3) tablet 1,000 Units 1,000 Units Oral QDAY   duloxetine DR (CYMBALTA) capsule 60 mg 60 mg Oral QDAY   enoxaparin (LOVENOX) syringe 40 mg 40 mg Subcutaneous BID   ferrous sulfate (FEOSOL, FEROSUL) tablet 325 mg 325 mg Oral QDAY   gabapentin (NEURONTIN) capsule 400 mg 400 mg Oral Q8H*   guaiFENesin LA (MUCINEX) tablet 600 mg 600 mg Oral BID   insulin aspart U-100 (NOVOLOG FLEXPEN) injection PEN 0-28 Units 0-28 Units Subcutaneous ACHS   ipratropium bromide (ATROVENT) 0.02 % nebulizer solution 0.5 mg 0.5 mg Inhalation QID & PRN   melatonin tablet 3 mg 3 mg Oral QHS   methylPREDNISolone (SOLU-MEDROL PF) injection 62.5 mg 62.5 mg Intravenous Q8H   metoprolol XL (TOPROL XL) tablet 25 mg 25 mg Oral QDAY   montelukast (SINGULAIR) tablet 10 mg 10 mg Oral QHS   pantoprazole DR (PROTONIX) tablet 40 mg 40 mg Oral BID   polyethylene glycol 3350 (MIRALAX) packet 17 g 1 packet Oral QDAY   senna/docusate (SENOKOT-S) tablet 1 tablet 1 tablet Oral BID spironolactone (ALDACTONE) tablet 25 mg 25 mg Oral BID(9-17)   Continuous Infusions:  PRN and Respiratory Meds:acetaminophen Q6H PRN, benzonatate TID PRN, codeine/guaiFENesin Q4H PRN, ondansetron Q6H PRN      Objective                       Vital Signs: Last Filed                 Vital Signs: 24 Hour Range   BP: 130/64 (09/24 0731)  Temp: 36.4 ???C (97.6 ???F) (09/24 0731)  Pulse: 93 (09/24 0914)  Respirations: 20 PER MINUTE (09/24 0914)  SpO2: 97 % (09/24 0914)  O2 Delivery: Nasal Cannula (09/24 0731) BP: (105-153)/(61-73)   Temp:  [36.4 ???C (97.6 ???F)-36.8 ???C (98.2 ???F)]   Pulse:  [79-96]   Respirations:  [18 PER MINUTE-22 PER MINUTE]   SpO2:  [92 %-99 %]   O2 Delivery: Nasal Cannula   Intensity Pain Scale (Self Report): Asleep (11/13/16 0400) Vitals:    11/08/16 0943 11/11/16 0000 11/12/16 1451   Weight: 136.1 kg (300 lb) (!) 140.6 kg (310 lb) (!) 138.8 kg (306 lb)       Intake/Output Summary:  (Last 24 hours)    Intake/Output Summary (Last 24 hours) at 11/13/16 1036  Last data filed at 11/13/16 0853   Gross per 24 hour   Intake              720 ml   Output             3975 ml   Net            -3255 ml      Stool Occurrence: 1    Physical Exam  General Appearance: No apparent distress, sitting upright in chair, obese  Skin: no rashes  HEENT:???PERRL, MMM  Chest and Lungs:???Coarse/tight breath sounds posteriorly, +wheezing, +ronchi  Cardiovascular:???RRR  Abdomen:???soft/obese, nondistended, +BS  Genitourinary: voiding  Extremities:???warm, 1+ LE edema, venous stasis  changes to BLE, Left Feet/ankle appear purple, cool-warm to touch. DP pulses palpable bilaterally  Neurologic:???alert &???oriented x3, able to move all extremities, non-focal      Lab Review  Pertinent labs reviewed personally    Point of Care Testing  (Last 24 hours)  Glucose: (!) 211 (11/13/16 0523)  POC Glucose (Download): (!) 185 (11/13/16 0731)    Radiology and other Diagnostics Review (Tele/EKG/Echo):    No pertinent radiology.    Alda Berthold, MD

## 2016-11-13 NOTE — Patient Education
Medication Education    Ruben Wood accepted counseling and was interactive. he verbalized understanding.    The following medications were discussed:  Novolog  Amoxicillin  Zyrtec  Solumedrol  Gabapentin  Bumex  Spironolactone  Zyloprim  Vitamin C  Aspirin  Lipitor  Vitamin D-3  Cymbalta  Lovenox  Mucinex  Metoprolol  Protonix  Miralax  Senokot  Flu vaccine     Where indicated, the patient was provided with additional medication and/or disease-state information. All patient questions were answered and patient acknowledged understanding of the medications, side effects and other pertinent medication information.    Also educated on VTE prevention. Pt still refused SCD.     Follow up should occur daily.    Continue to address: indications    Gwynn Burly, RN

## 2016-11-13 NOTE — Progress Notes
Heart Failure Progress Note    NAME:Ruben Wood.                                                                   MRN: [id number]                 DOB:June 20, 1940          AGE: 76 y.o.  ADMISSION DATE: 11/07/2016             DAYS ADMITTED: LOS: 6 days     Assessment:  Active Problems:    OSA on CPAP    GERD (gastroesophageal reflux disease)    CAD (coronary artery disease)    AAA (abdominal aortic aneurysm) (HCC)    CKD (chronic kidney disease) stage 3, GFR 30-59 ml/min (HCC)    Chronic combined systolic and diastolic congestive heart failure, NYHA class 2 (HCC)    Chronic obstructive pulmonary disease (HCC)    Ischemic cardiomyopathy    Moderate persistent asthma with acute exacerbation    Iron deficiency    Acute respiratory failure with hypoxia (HCC)    Severe sepsis (HCC)    Lactic acid acidosis    Hyperglycemia    Rhinovirus infection       Acute on chronic systolic and diastolic HFrEF,  EF: 50% s/p CardioMEMS  Major Complications or Comorbidities [facility]): acute respiratory failure  NYHA functional class III (marked limitation of physical activity - comfortable at rest, but less than ordinary activity causes symptoms of HF e.g., getting dressed or standing from a sitting position),   ACC Stage C (structural heart disease with prior or current symptoms of HF). due to ICM s/p ICD implant   He presents with signs of hypovolemia with Bi  ventricular failure without signs of low flow state.  Admission BNP: 92  Prior to admission diuretic regimen: Bumetanide 2 mg po BID and Spironolactone 25 mg po bid  Goal Output: Keep pt euvolemic  Intake/Output:  Entire stay net: -13 608, Last 24 hr net: -3225  PAD of 5-10; today he is 9  Admission Weight: 136.1 kg (300 lb)        Most recent weights (inpatient):   Vitals:    11/08/16 0943 11/11/16 0000 11/12/16 1451   Weight: 136.1 kg (300 lb) (!) 140.6 kg (310 lb) (!) 138.8 kg (306 lb)        Coronary artery disease LHC 2004 at [facility]-severe OMB/Circ and distal circ disease CTO of L circ with collaterals   LHC 2014 CTO of RCA, CTO OM and high grade 90% mCirc. Failed attempt at PCI of OM  10/14/12: Unsuccessful attempt to open CTO of OMB and mid-CFX by Dr. Mackey Birchwood   Continue ASA, BB, STATIN  Denies anginal symptoms   ???  NSVT  K 4.1, Mg2.3  Pt asx    Chronic Kidney Disease stage III  BL Cr. ~ 1.4  Cr. Up to 2.07 this admit now back to baseline 1.29  fluctuant kidney function as well as orthostatic symptoms in the past.   ???  Hypertension-stable. Currently on Bumetanide, BB and Cleda Daub as above.    ???  Dyslipidemia-LDL 76 02/25/16. Treated with Atorvastatin 20 mg daily PTA  ???  Chronic Obstructive Pulmonary Disease/Asthma/acute exacerbation  of chronic respiratory failure/Sepsis  Blood cultures negative to date  Viral panel was positive for rhinovirus  Sputum cultures grew gram +ve cocci (strep)  treated with multiple inhalers, antibiotics and Methylprednisolone IV. Dose to be adjusted down to 62.5 mg q 8 hours 11/10/16  02 requirements down to 4L.   ???  Abdominal Aortic Aneurysm   Duplex done at OSH in 2007 and 2008 ~ 4.1 cm  Duplex 10/15/12-AAA 7.3 cm AP x 7.3 cm  2014 Endovascular repair of large abdominal aortic aneurysm utilizing Gore endovascular device  ???  Obstructive Sleep Apnea treated with CPAP  ???  Morbid Obesity-Body mass index is 41.84 kg/m???. Weight loss, Cardiac Healthy diet, and exercise when patient can tolerate. Dietitian Consult.     Plan:  Guideline Based Heart Failure Therapies:  HF approved beta blockers: Yes (Metoprolol CR/XL) 25 mg po daily   ACE/ARB/Angiotensin II Receptor Blocker Neprilysin Inhibitor:  No  neither Hypotension nor Renal dysfunction  Aldosterone antagonist: Yes Spironolactone 25 mg po bid  Ivabradine: No; Not treated with maximally tolerated dose beta blockers or beta blockers contraindicated  Diuretic Regimen: Decrease Bumex back to PTA dose of 2mg  BID; Spironolactone 25mg  mg BID as PAD within goal  BMP once a day. Magnesium level daily.  Keep Potassium greater than 4.0 and Magnesium greater than 2.0.  2000mg  sodium dietary restriction.  Liberalize fluid restriction to 2L  Strict I/O; keep pt euvolemic  Standing scale daily weight  Follow Up: appointment with a member of the HF team or PCP within 7 calendar days of discharge.   ICD Device interrogation for NSVT    Discussed with Dr.Genton      MD      _____________________________________________________________________________    Subjective:     Pt reports his wheezing and dyspnea have improved but not resolved.  He reports his leg swelling is worse.  He denies chest pain, dizziness, palpitations, fever or chills.  No other acute concerns or complaints today.    Objective:    albuterol 0.5% (PROVENTIL; VENTOLIN) nebulizer solution 2.5 mg 2.5 mg Inhalation QID & PRN   allopurinol (ZYLOPRIM) tablet 300 mg 300 mg Oral QDAY   amitriptyline/gabapentin/emu oil(#) 05/24/08 % topical cream 5 g 5 g Topical TID   amoxicillin/K clavulanate (AUGMENTIN) tablet 875 mg 875 mg Oral BID w/meals   ascorbic acid (VITAMIN C) tablet 500 mg 500 mg Oral QDAY   aspirin tablet 325 mg 325 mg Oral QDAY   atorvastatin (LIPITOR) tablet 20 mg 20 mg Oral QDAY   budesonide/formoterol (SYMBICORT HFA) 160/4.5 mcg inhalation 2 puff 2 puff Inhalation BID   bumetanide (BUMEX) tablet 3 mg 3 mg Oral BID(9-17)   cetirizine (ZYRTEC) tablet 10 mg 10 mg Oral QAM8   cholecalciferol (VITAMIN D-3) tablet 1,000 Units 1,000 Units Oral QDAY   duloxetine DR (CYMBALTA) capsule 60 mg 60 mg Oral QDAY   enoxaparin (LOVENOX) syringe 40 mg 40 mg Subcutaneous BID   ferrous sulfate (FEOSOL, FEROSUL) tablet 325 mg 325 mg Oral QDAY   gabapentin (NEURONTIN) capsule 400 mg 400 mg Oral Q8H*   guaiFENesin LA (MUCINEX) tablet 600 mg 600 mg Oral BID   insulin aspart U-100 (NOVOLOG FLEXPEN) injection PEN 0-28 Units 0-28 Units Subcutaneous ACHS ipratropium bromide (ATROVENT) 0.02 % nebulizer solution 0.5 mg 0.5 mg Inhalation QID & PRN   melatonin tablet 3 mg 3 mg Oral QHS   methylPREDNISolone (SOLU-MEDROL PF) injection 62.5 mg 62.5 mg Intravenous Q8H   metoprolol XL (TOPROL XL) tablet 25 mg  25 mg Oral QDAY   montelukast (SINGULAIR) tablet 10 mg 10 mg Oral QHS   pantoprazole DR (PROTONIX) tablet 40 mg 40 mg Oral BID   polyethylene glycol 3350 (MIRALAX) packet 17 g 1 packet Oral QDAY   senna/docusate (SENOKOT-S) tablet 1 tablet 1 tablet Oral BID   spironolactone (ALDACTONE) tablet 25 mg 25 mg Oral BID(9-17)     acetaminophen Q6H PRN, benzonatate TID PRN, codeine/guaiFENesin Q4H PRN, ondansetron Q6H PRN                       Vital Signs:  Last Filed                Vital Signs: 24 Hour Range   BP: 155/81 (09/24 1533)  Temp: 36.6 ???C (97.8 ???F) (09/24 1533)  Pulse: 95 (09/24 1533)  Respirations: 20 PER MINUTE (09/24 1533)  SpO2: 93 % (09/24 1533)  O2 Delivery: Nasal Cannula (09/24 1533)  BP: (105-155)/(61-81)   Temp:  [36.4 ???C (97.6 ???F)-36.8 ???C (98.2 ???F)]   Pulse:  [80-102]   Respirations:  [18 PER MINUTE-22 PER MINUTE]   SpO2:  [92 %-99 %]   O2 Delivery: Nasal Cannula    Intensity Pain Scale (Self Report): Asleep  Verbal Pain Description: Moderate Pain      Physical Exam:   General Appearance: well developed, well nourished, persisting cough  Neck Veins: neck veins are not distended   Chest Inspection: chest is normal in appearance   Respiratory Effort: no acute distress   Auscultation/Percussion: lungs with ongoing bilateral wheezing and coarse breath sounds   Cardiac Rhythm: regular rhythm and normal rate  Cardiac Auscultation: S1, S2 normal, no rub, no gallop   Murmurs: no murmur   Radial Arteries: normal symmetric radial pulses   Lower Extremity: 3+ lower extremity edema, discolored and cool   Abdominal Exam: soft, non-tender, non-distended, normal bowel sounds   Neurologic Exam: neurological assessment grossly intact   Other: moves all extremities  ??? Laboratory Review:   24-hour labs:    Results for orders placed or performed during the hospital encounter of 11/07/16 (from the past 24 hour(s))   POC GLUCOSE    Collection Time: 11/12/16  8:59 PM   Result Value Ref Range    Glucose, POC 238 (H) 70 - 100 MG/DL   CBC AND DIFF    Collection Time: 11/13/16  5:23 AM   Result Value Ref Range    White Blood Cells 9.9 4.5 - 11.0 K/UL    RBC 4.03 (L) 4.4 - 5.5 M/UL    Hemoglobin 14.2 13.5 - 16.5 GM/DL    Hematocrit 45.4 40 - 50 %    MCV 105.2 (H) 80 - 100 FL    MCH 35.2 (H) 26 - 34 PG    MCHC 33.4 32.0 - 36.0 G/DL    RDW 09.8 (H) 11 - 15 %    Platelet Count 155 150 - 400 K/UL    MPV 8.7 7 - 11 FL    Neutrophils 93 (H) 41 - 77 %    Lymphocytes 4 (L) 24 - 44 %    Monocytes 3 (L) 4 - 12 %    Eosinophils 0 0 - 5 %    Basophils 0 0 - 2 %    Absolute Neutrophil Count 9.20 (H) 1.8 - 7.0 K/UL    Absolute Lymph Count 0.40 (L) 1.0 - 4.8 K/UL    Absolute Monocyte Count 0.30 0 - 0.80 K/UL  Absolute Eosinophil Count 0.00 0 - 0.45 K/UL    Absolute Basophil Count 0.00 0 - 0.20 K/UL   COMPREHENSIVE METABOLIC PANEL    Collection Time: 11/13/16  5:23 AM   Result Value Ref Range    Sodium 134 (L) 137 - 147 MMOL/L    Potassium 4.1 3.5 - 5.1 MMOL/L    Chloride 96 (L) 98 - 110 MMOL/L    Glucose 211 (H) 70 - 100 MG/DL    Blood Urea Nitrogen 57 (H) 7 - 25 MG/DL    Creatinine 1.61 (H) 0.4 - 1.24 MG/DL    Calcium 8.7 8.5 - 09.6 MG/DL    Total Protein 6.0 6.0 - 8.0 G/DL    Total Bilirubin 1.2 0.3 - 1.2 MG/DL    Albumin 3.6 3.5 - 5.0 G/DL    Alk Phosphatase 58 25 - 110 U/L    AST (SGOT) 22 7 - 40 U/L    CO2 27 21 - 30 MMOL/L    ALT (SGPT) 29 7 - 56 U/L    Anion Gap 11 3 - 12    eGFR Non African American 54 (L) >60 mL/min    eGFR African American >60 >60 mL/min   MAGNESIUM    Collection Time: 11/13/16  5:23 AM   Result Value Ref Range    Magnesium 2.3 1.6 - 2.6 mg/dL   PHOSPHORUS    Collection Time: 11/13/16  5:23 AM   Result Value Ref Range    Phosphorus 2.8 2.0 - 4.5 MG/DL   POC GLUCOSE Collection Time: 11/13/16  7:31 AM   Result Value Ref Range    Glucose, POC 185 (H) 70 - 100 MG/DL   POC GLUCOSE    Collection Time: 11/13/16 12:20 PM   Result Value Ref Range    Glucose, POC 179 (H) 70 - 100 MG/DL   POC GLUCOSE    Collection Time: 11/13/16  5:03 PM   Result Value Ref Range    Glucose, POC 189 (H) 70 - 100 MG/DL       Point of Care Testing:  (Last 24 hours):  Glucose: (!) 211 (11/13/16 0523)  POC Glucose (Download): (!) 189 (11/13/16 1703)    Tele/ECG: NSVT    Echocardiogram Details:  Echo Results  (Last 3 results in the past 3 years)    Echo EF LVIDD LA Size IVS LVPW Rest PAP    (11/08/16)  50 (11/08/16)  6.31 (11/08/16)  4.24 (11/08/16)  1.47 (11/08/16)  1.23 (11/07/16)  19    (05/17/16)  40 (05/17/16)  5.49 (11/07/16)  3.88 (05/17/16)  1.1 (05/17/16)  0.75 (05/17/16)  32    (10/27/15)  30 (10/27/15)  6.0 (05/17/16)  4.71 (10/27/15)  1.3 (10/27/15)  1.0 (10/27/15)  NA           Chest X-Ray:  11/07/2016: Development of right basilar consolidation, which may reflect pneumonia,   atelectasis, and/or aspiration.

## 2016-11-13 NOTE — Progress Notes
OCCUPATIONAL THERAPY  PROGRESS NOTE    Patient Name: Ruben Wood.                   Room/Bed: BH6205/01  Admitting Diagnosis: Respiratory Failure    Mobility  Progressive Mobility Level: Walk in room  Distance Walked (feet): 10 ft  Level of Assistance: Stand by assistance  Assistive Device: Walker  Time Tolerated: 11-30 minutes  Activity Limited By: Shortness of air;Weakness    Subjective  HPI: PMHx includes: depression, chronic back pain, asthma, CKD, HTN, HLD, CAD, ischemic cardiomyopathy, s/p PM, ICD, chronic back pain. Transferred from OSH 11/07/16 with acute respiratory failure and + rhinovirus.  Precautions: Isolation;O2 Requirement (Droplet)  Pain / Complaints: Patient has no c/o pain;Patient demonstrates no signs of pain    Objective  Psychosocial Status: Willing and Cooperative to Participate  Persons Present: Family    Home Living  Type of Home: Engineering geologist / Tub: Psychologist, counselling    ADL's  Where Assessed: Standing at [employer];Chair  Grooming Assist: Stand By Assist  Grooming Deficits: Increased Time To Complete;Supervision/Safety;Standing With Assistive Device;Teeth Care  Functional Transfer Assist: Stand By Assist  Functional Transfer Deficits: Setup;Verbal Cueing;Supervision/Safety;Increased Time to Complete  Comment: Presents seated in chair. Agreeable to participating in oral hygiene at sink. Able to stand from chair with SBA with use of RW. Ambulated 10 ft to bathroom at RW level and SBA. Participated in oral hygiene while standing at sink x approx 3 min with SBA. Needing seated RB during return ambulation to chair. Verbal cues to reach back with UE during descent. 02 saturation maintained at 98% on 3L NC. Completed upper body bathing with wet wash rag with SBA for set up and verbal cues to locate food residue on chest. Returned to recliner chair with SBA. Chair alarm activated and all needs within reach.    Activity Tolerance Endurance: 2/5 Tolerates 10-20 Minutes Exercise w/Multiple Rests    Assessment  Assessment: Decreased ADL Status;Decreased Safe/Judg during ADL;Decreased Self-Care Trans;Decreased High-Level ADLs    AM-PAC 6 Clicks Daily Activity Inpatient  Putting on and taking off regular lower body clothes?: A Little  Bathing (Including washing, rinsing, drying): A Little  Toileting, which includes using toilet, bedpan, or urinal: A Little  Putting on and taking off regular upper body clothing: None  Taking care of personal grooming such as brushing teeth: None  Eating meals?: None  Daily Activity Raw Score: 21  Standardized (t-scale) score: 44.27  CMS 0-100% Score: 32.79  CMS G Code Modifier: CJ    Plan   OT Frequency: 3-5x/week  OT Plan for Next Visit: Improve standing endurance through ADL performance.     ADL Goals  Patient Will Perform All ADL's: w/ Modified Independence    Functional Transfer Goals  Pt Will Perform All Functional Transfers: Modified Independent      OT Discharge Recommendations  OT Discharge Recommendations: Home with family assist  Equipment Recommendations: Too early to be determined  Recommend ongoing assistance for: In and out of house, Transfers, Ambulation, Stairs        Therapist: Lenox Ponds, [place] R/L [id number]  Date: 11/13/2016

## 2016-11-13 NOTE — Progress Notes
Teaching Physician Attestation:  I have personally interviewed and examined the patient, have reviewed the documentation, and agree with the assessment and plan of the resident.  He continues to have cough and wheeze.  He has rhinovirus infection which is exacerbated his chronic lung disease.  His cardio mems device shows a PA diastolic of 9.  He does have some lower extremity edema and evidence of venous insufficiency.  His most recent echocardiogram showed ejection fraction 50%.  He has underlying coronary artery disease but no symptoms of angina his by the device is functional he is having some nonsustained VT.  We will need to check his potassium and magnesium levels.  We may want to liberalize his fluid intake and adjust the diuretics accordingly.  Would like to continue to try to keep him volume neutral at this point.  We will continue to monitor his cardio mems on a daily basis.  Lazarus Gowda, MD

## 2016-11-13 NOTE — Patient Education
Medication Education    Ruben Wood accepted counseling and was engaged.  he verbalized understanding.    The following medications were discussed:  lovenox  Gabapentin  mucunex  novolog  Melatonin  singulair  protonix    Where indicated, the patient was provided with additional medication and/or disease-state information.  All patient questions were answered and patient acknowledged understanding of the medications, side effects and other pertinent medication information.    Follow up should occur daily.    Continue to address: indications     Pt also educated on fluid restriction, fall bundle, contact with mask precautions.     Dorena Bodo

## 2016-11-13 NOTE — Progress Notes
Daily CardioMEMS Readings      Goal PA Diastolic: 5-10 mmHg  11/13/16 PA Diastolic: 33mmHg--Within goal. Notified Cardiology team.     Taken on PA Systolic PA Diastolic PA Mean Heart Rate    11-13-2016, 10:05 AM 22 mmHg 9 mmHg 14 mmHg 101 bpm    11-10-2016, 09:33 AM 31 mmHg 13 mmHg 19 mmHg 87 bpm    11-09-2016, 11:20 AM 22 mmHg 7 mmHg 11 mmHg 74 bpm    11-08-2016, 10:40 AM 36 mmHg 16 mmHg 23 mmHg 88 bpm    11-06-2016, 06:55 PM 42 mmHg 20 mmHg 28 mmHg 101 bpm    11-01-2016, 11:20 AM 21 mmHg 7 mmHg 12 mmHg 105 bpm    10-31-2016, 10:48 AM 23 mmHg 7 mmHg 13 mmHg 97 bpm    10-30-2016, 12:50 PM 27 mmHg 9 mmHg 17 mmHg 112 bpm    10-29-2016, 11:08 AM 22 mmHg 8 mmHg 13 mmHg 98 bpm    10-27-2016, 09:17 AM 25 mmHg 9 mmHg 14 mmHg 94 bpm    10-26-2016, 06:31 AM 29 mmHg 11 mmHg 17 mmHg 84 bpm    10-25-2016, 06:32 AM 24 mmHg 11 mmHg 15 mmHg 107 bpm    10-24-2016, 10:27 AM 27 mmHg 10 mmHg 16 mmHg 85 bpm    10-22-2016, 07:26 AM 26 mmHg 9 mmHg 15 mmHg 84 bpm    10-21-2016, 03:49 PM 23 mmHg 9 mmHg 14 mmHg 102 bpm        Matthew Folks, RN, Engineer, civil (consulting)  Phone: [phone]  Pager: [phone]

## 2016-11-14 LAB — MAGNESIUM: Lab: 2.2 mg/dL — ABNORMAL LOW (ref 1.6–2.6)

## 2016-11-14 LAB — POC GLUCOSE
Lab: 243 mg/dL — ABNORMAL HIGH (ref 70–100)
Lab: 233 mg/dL — ABNORMAL HIGH (ref 70–100)
Lab: 145 mg/dL — ABNORMAL HIGH (ref 70–100)

## 2016-11-14 LAB — COMPREHENSIVE METABOLIC PANEL: Lab: 134 MMOL/L — ABNORMAL LOW (ref 137–147)

## 2016-11-14 LAB — PHOSPHORUS: Lab: 3.1 mg/dL — ABNORMAL LOW (ref 2.0–4.5)

## 2016-11-14 MED ORDER — ALBUTEROL SULFATE 2.5 MG/0.5 ML IN NEBU
2.5 mg | RESPIRATORY_TRACT | 0 refills | Status: DC | PRN
Start: 2016-11-14 — End: 2016-11-16
  Administered 2016-11-14 – 2016-11-16 (×5): 2.5 mg via RESPIRATORY_TRACT

## 2016-11-14 MED ORDER — PREDNISONE 20 MG PO TAB
60 mg | Freq: Every day | ORAL | 0 refills | Status: DC
Start: 2016-11-14 — End: 2016-11-16
  Administered 2016-11-16: 14:00:00 60 mg via ORAL

## 2016-11-14 MED ORDER — IPRATROPIUM BROMIDE 0.02 % IN SOLN
0.5 mg | RESPIRATORY_TRACT | 0 refills | Status: DC | PRN
Start: 2016-11-14 — End: 2016-11-16
  Administered 2016-11-14 – 2016-11-16 (×4): 0.5 mg via RESPIRATORY_TRACT

## 2016-11-14 MED ORDER — BUMETANIDE 2 MG PO TAB
2 mg | Freq: Two times a day (BID) | ORAL | 0 refills | Status: DC
Start: 2016-11-14 — End: 2016-11-16
  Administered 2016-11-14 – 2016-11-16 (×5): 2 mg via ORAL

## 2016-11-14 NOTE — Progress Notes
Heart Failure Progress Note    NAME:Ruben Wood.                                                                   MRN: [id number]                 DOB:[birth year]          AGE: 76 y.o.  ADMISSION DATE: 11/07/2016             DAYS ADMITTED: LOS: 7 days     Assessment:  Active Problems:    OSA on CPAP    GERD (gastroesophageal reflux disease)    CAD (coronary artery disease)    AAA (abdominal aortic aneurysm) (HCC)    CKD (chronic kidney disease) stage 3, GFR 30-59 ml/min (HCC)    Chronic combined systolic and diastolic congestive heart failure, NYHA class 2 (HCC)    Chronic obstructive pulmonary disease (HCC)    Ischemic cardiomyopathy    Moderate persistent asthma with acute exacerbation    Iron deficiency    Acute respiratory failure with hypoxia (HCC)    Severe sepsis (HCC)    Lactic acid acidosis    Hyperglycemia    Rhinovirus infection     76 y/o M???w/ PMH depression, chronic pain, asthma, CKD, HTN, HLD, CAD, ischemic cardiomyopathy s/p pacer, ICD &???cardioMEMS implant admitted for acute hypoxic respiratory failure and cardiology consulted for Acute HF exacerbation    Acute on chronic systolic and diastolic HFrEF,  EF: 50% s/p CardioMEMS  Major Complications or Comorbidities [facility]): acute respiratory failure  NYHA functional class III (marked limitation of physical activity - comfortable at rest, but less than ordinary activity causes symptoms of HF e.g., getting dressed or standing from a sitting position),   ACC Stage C (structural heart disease with prior or current symptoms of HF). due to ICM s/p ICD implant   He presents with signs of hypovolemia with Bi  ventricular failure without signs of low flow state.  Admission BNP: 92  Prior to admission diuretic regimen: Bumetanide 2 mg po BID and Spironolactone 25 mg po bid  Goal Output: Keep pt euvolemic  Intake/Output:  Entire stay net: -[phone], Last 24 hr net: -[phone]  PAD goal of 5-10; today he is 8  Admission Weight: 136.1 kg (300 lb) Most recent weights (inpatient):   Vitals:    11/11/16 0000 11/12/16 1451 11/14/16 0536   Weight: (!) 140.6 kg (310 lb) (!) 138.8 kg (306 lb) (!) 137.3 kg (302 lb 12.8 oz)        Coronary artery disease  LHC 2004 at [facility]-severe OMB/Circ and distal circ disease CTO of L circ with collaterals   LHC 2014 CTO of RCA, CTO OM and high grade 90% mCirc. Failed attempt at PCI of OM  10/14/12: Unsuccessful attempt to open CTO of OMB and mid-CFX by Dr. Mackey Birchwood   Continue ASA, BB, STATIN  Denies anginal symptoms   ???  Sinus Tachycardia, not picked up by Pacemaker  K 4.2, Mg 2.2  nml device fxn but max tracking rate was set to 100bpm and pt was having sinus rates up to 125bpm, so he was not receiving BiV pacing beyond 100 bpm, we will order device re-programming which will  adjust max track rate to appropriately BiV pace pt's sinus tachycardia  Pt asx    Chronic Kidney Disease stage III  BL Cr. ~ 1.4  Cr. Up to 2.07 this admit now back to baseline  fluctuant kidney function as well as orthostatic symptoms in the past.   ???  Hypertension  Continue Bumetanide, BB and Spiro as above.    ???  Dyslipidemia-LDL 76 02/25/16.   Continue Atorvastatin 20 mg daily PTA  ???  Chronic Obstructive Pulmonary Disease/Asthma/acute exacerbation of chronic respiratory failure/Sepsis  Blood cultures negative to date  Viral panel was positive for rhinovirus  Sputum cultures grew gram +ve cocci (strep)  treated with multiple inhalers, antibiotics and Methylprednisolone IV. Dose to be adjusted down to 62.5 mg q 8 hours 11/10/16  02 requirements down to 3L.   On Augmentin until 9/26  ???  Abdominal Aortic Aneurysm   Duplex done at OSH in 2007 and 2008 ~ 4.1 cm  Duplex 10/15/12-AAA 7.3 cm AP x 7.3 cm  2014 Endovascular repair of large abdominal aortic aneurysm utilizing Gore endovascular device  ???  Obstructive Sleep Apnea treated with CPAP  ???  Morbid Obesity-Body mass index is 41.84 kg/m???. Weight loss, Cardiac Healthy diet, and exercise when patient can tolerate. Dietitian Consult.     Plan:  Guideline Based Heart Failure Therapies:  HF approved beta blockers: Yes (Metoprolol CR/XL) 25 mg po daily   ACE/ARB/Angiotensin II Receptor Blocker Neprilysin Inhibitor:  No  neither Hypotension nor Renal dysfunction  Aldosterone antagonist: Yes Spironolactone 25 mg po bid  Ivabradine: No; Not treated with maximally tolerated dose beta blockers or beta blockers contraindicated  Diuretic Regimen: Continue Bumex PTA dose of 2mg  BID; Spironolactone 25mg  mg BID as PAD within goal  BMP once a day. Magnesium level daily.  Keep Potassium greater than 4.0 and Magnesium greater than 2.0.  2000mg  sodium dietary restriction.  Liberalize fluid restriction to 2L  Strict I/O; keep pt euvolemic  Standing scale daily weight  Follow Up: appointment with a member of the HF team or PCP within 7 calendar days of discharge.   Will order device re-programming which will adjust max track rate of CRT to appropriately BiV pace pt's sinus tachycardia    Discussed with Dr.Genton    Margarito Courser MD      _____________________________________________________________________________    Subjective:     Pt reports feeling better with improved wheezing, dyspnea, coughing and leg swelling.  He denies chest pain, dizziness, palpitations, fever or chills.  No other acute concerns or complaints today.    Objective:    albuterol 0.5% (PROVENTIL; VENTOLIN) nebulizer solution 2.5 mg 2.5 mg Inhalation Q6H While Awake & PRN   allopurinol (ZYLOPRIM) tablet 300 mg 300 mg Oral QDAY   amitriptyline/gabapentin/emu oil(#) 05/24/08 % topical cream 5 g 5 g Topical TID   amoxicillin/K clavulanate (AUGMENTIN) tablet 875 mg 875 mg Oral BID w/meals   ascorbic acid (VITAMIN C) tablet 500 mg 500 mg Oral QDAY   aspirin tablet 325 mg 325 mg Oral QDAY   atorvastatin (LIPITOR) tablet 20 mg 20 mg Oral QDAY   budesonide/formoterol (SYMBICORT HFA) 160/4.5 mcg inhalation 2 puff 2 puff Inhalation BID   bumetanide (BUMEX) tablet 2 mg 2 mg Oral BID(9-17)   cetirizine (ZYRTEC) tablet 10 mg 10 mg Oral QAM8   cholecalciferol (VITAMIN D-3) tablet 1,000 Units 1,000 Units Oral QDAY   duloxetine DR (CYMBALTA) capsule 60 mg 60 mg Oral QDAY   enoxaparin (LOVENOX) syringe 40 mg 40 mg  Subcutaneous BID   ferrous sulfate (FEOSOL, FEROSUL) tablet 325 mg 325 mg Oral QDAY   gabapentin (NEURONTIN) capsule 400 mg 400 mg Oral Q8H*   guaiFENesin LA (MUCINEX) tablet 600 mg 600 mg Oral BID   insulin aspart U-100 (NOVOLOG FLEXPEN) injection PEN 0-28 Units 0-28 Units Subcutaneous ACHS   ipratropium bromide (ATROVENT) 0.02 % nebulizer solution 0.5 mg 0.5 mg Inhalation Q6H While Awake & PRN   melatonin tablet 3 mg 3 mg Oral QHS   methylPREDNISolone (SOLU-MEDROL PF) injection 62.5 mg 62.5 mg Intravenous Q8H   metoprolol XL (TOPROL XL) tablet 25 mg 25 mg Oral QDAY   montelukast (SINGULAIR) tablet 10 mg 10 mg Oral QHS   pantoprazole DR (PROTONIX) tablet 40 mg 40 mg Oral BID   polyethylene glycol 3350 (MIRALAX) packet 17 g 1 packet Oral QDAY   senna/docusate (SENOKOT-S) tablet 1 tablet 1 tablet Oral BID   spironolactone (ALDACTONE) tablet 25 mg 25 mg Oral BID(9-17)     acetaminophen Q6H PRN, benzonatate TID PRN, codeine/guaiFENesin Q4H PRN, ondansetron Q6H PRN                       Vital Signs:  Last Filed                Vital Signs: 24 Hour Range   BP: 133/70 (09/25 1137)  Temp: 36.4 ???C (97.6 ???F) (09/25 1137)  Pulse: 94 (09/25 1137)  Respirations: 18 PER MINUTE (09/25 1137)  SpO2: 95 % (09/25 1137)  O2 Delivery: Nasal Cannula (09/25 1137)  BP: (128-152)/(60-74)   Temp:  [36.4 ???C (97.6 ???F)-36.7 ???C (98.1 ???F)]   Pulse:  [90-106]   Respirations:  [16 PER MINUTE-20 PER MINUTE]   SpO2:  [93 %-96 %]   O2 Delivery: Nasal Cannula    Intensity Pain Scale (Self Report): Asleep  Verbal Pain Description: Moderate Pain      Physical Exam:   General Appearance: well developed, well nourished, cough Chest Inspection: chest is normal in appearance   Respiratory Effort: no acute distress   Auscultation/Percussion: lungs CTAB with noisy breathing   Cardiac Rhythm: regular rhythm and normal rate  Cardiac Auscultation: S1, S2 normal, no rub, no gallop   Murmurs: no murmur   Radial Arteries: normal symmetric radial pulses   Lower Extremity: 2+ left lower extremity edema, discolored and cool with compression stockings in place bilaterally   Abdominal Exam: soft, non-tender, non-distended, normal bowel sounds   Neurologic Exam: neurological assessment grossly intact   Other: moves all extremities  ???  Laboratory Review:   24-hour labs:    Results for orders placed or performed during the hospital encounter of 11/07/16 (from the past 24 hour(s))   POC GLUCOSE    Collection Time: 11/13/16  5:03 PM   Result Value Ref Range    Glucose, POC 189 (H) 70 - 100 MG/DL   POC GLUCOSE    Collection Time: 11/13/16  9:19 PM   Result Value Ref Range    Glucose, POC 243 (H) 70 - 100 MG/DL   COMPREHENSIVE METABOLIC PANEL    Collection Time: 11/14/16  5:34 AM   Result Value Ref Range    Sodium 134 (L) 137 - 147 MMOL/L    Potassium 4.2 3.5 - 5.1 MMOL/L    Chloride 95 (L) 98 - 110 MMOL/L    Glucose 199 (H) 70 - 100 MG/DL    Blood Urea Nitrogen 57 (H) 7 - 25 MG/DL    Creatinine 1.61 (  H) 0.4 - 1.24 MG/DL    Calcium 8.9 8.5 - 16.1 MG/DL    Total Protein 6.2 6.0 - 8.0 G/DL    Total Bilirubin 1.3 (H) 0.3 - 1.2 MG/DL    Albumin 3.7 3.5 - 5.0 G/DL    Alk Phosphatase 66 25 - 110 U/L    AST (SGOT) 24 7 - 40 U/L    CO2 27 21 - 30 MMOL/L    ALT (SGPT) 34 7 - 56 U/L    Anion Gap 12 3 - 12    eGFR Non African American 56 (L) >60 mL/min    eGFR African American >60 >60 mL/min   MAGNESIUM    Collection Time: 11/14/16  5:34 AM   Result Value Ref Range    Magnesium 2.2 1.6 - 2.6 mg/dL   PHOSPHORUS    Collection Time: 11/14/16  5:34 AM   Result Value Ref Range    Phosphorus 3.1 2.0 - 4.5 MG/DL   POC GLUCOSE    Collection Time: 11/14/16 11:35 AM Result Value Ref Range    Glucose, POC 233 (H) 70 - 100 MG/DL       Point of Care Testing:  (Last 24 hours):  Glucose: (!) 199 (11/14/16 0534)  POC Glucose (Download): (!) 233 (11/14/16 1135)    Tele/ECG: Sinus tachycardia, PVCs    Echocardiogram Details:  Echo Results  (Last 3 results in the past 3 years)    Echo EF LVIDD LA Size IVS LVPW Rest PAP    (11/08/16)  50 (11/08/16)  6.31 (11/08/16)  4.24 (11/08/16)  1.47 (11/08/16)  1.23 (11/07/16)  19    (05/17/16)  40 (05/17/16)  5.49 (11/07/16)  3.88 (05/17/16)  1.1 (05/17/16)  0.75 (05/17/16)  32    (10/27/15)  30 (10/27/15)  6.0 (05/17/16)  4.71 (10/27/15)  1.3 (10/27/15)  1.0 (10/27/15)  NA           Chest X-Ray:  11/07/2016: Development of right basilar consolidation, which may reflect pneumonia,   atelectasis, and/or aspiration.

## 2016-11-14 NOTE — Progress Notes
RT Adult Assessment Note    NAME:Ruben Wood.             MRN: [id number]             DOB:Jul 06, 1940          AGE: 76 y.o.  ADMISSION DATE: 11/07/2016             DAYS ADMITTED: LOS: 7 days    RT Treatment Plan:  Protocol Plan: Medications  Albuterol/Ipratropium: Neb PRN;Neb Q6h While Awake & PRN    Protocol Plan: Procedures  Vibrating PEP Therapy: Q6h While Awake  Oxygen/Humidity: O2 to keep SpO2 > 92%  Monitoring: Pulse oximetry BID & PRN  Comment: Symbicort    Additional Comments:  Impressions of the patient: pt is sitting up in a chair, and is markedly better than yesterday, wheezing in not auscultated now, and his RR has slowed considerably.  Intervention(s)/outcome(s): Changed Neb and Oscillating PEP frequency to Q6HWA and PRN  Patient education that was completed: NA  Recommendations to the care team: Continue treatment, at next level, and return pt to home regimine as possible    Vital Signs:  Pulse: Pulse: 99  RR: Respirations: 16 PER MINUTE  SpO2: SpO2: 94 %  O2 Device:    Liter Flow: O2 Liter Flow: 3 lpm  O2%:    Breath Sounds: All Breath Sounds: Decreased  Respiratory Effort: Respiratory Effort: Non-Labored

## 2016-11-14 NOTE — Patient Education
Medication Education    EdgarArcheracceptedcounseling and wasinteractive.heverbalized understanding.    The following medications were discussed:  Novolog  Amoxicillin  Zyrtec  Solumedrol  Gabapentin  Bumex  Spironolactone  Zyloprim  Vitamin C  Aspirin  Lipitor  Vitamin D-3  Cymbalta  Lovenox  Mucinex  Metoprolol  Protonix  Miralax  Senokot  Flu vaccine     Where indicated, the patientwas provided with additional medication and/or disease-state information.All patient questions were answered and patient acknowledged understanding of the medications, side effects and other pertinent medication information.    Also educated on keeping legs elevated at all times to use gravity to draw back fluid.     Follow upshould occur daily.    Continue to address: indications    Gwynn Burly, RN

## 2016-11-14 NOTE — Progress Notes
Daily CardioMEMS Readings    Goal PA Diastolic: 5-10 mmHg  Taken on PA Systolic PA Diastolic PA Mean Heart Rate    11-14-2016, 09:30 AM 20 mmHg 8 mmHg 12 mmHg 103 bpm    11-13-2016, 10:05 AM 22 mmHg 9 mmHg 14 mmHg 101 bpm    11-10-2016, 09:33 AM 31 mmHg 13 mmHg 19 mmHg 87 bpm    11-09-2016, 11:20 AM 22 mmHg 7 mmHg 11 mmHg 74 bpm    11-08-2016, 10:40 AM 36 mmHg 16 mmHg 23 mmHg 88 bpm    11-06-2016, 06:55 PM 42 mmHg 20 mmHg 28 mmHg 101 bpm    11-01-2016, 11:20 AM 21 mmHg 7 mmHg 12 mmHg 105 bpm    10-31-2016, 10:48 AM 23 mmHg 7 mmHg 13 mmHg 97 bpm    10-30-2016, 12:50 PM 27 mmHg 9 mmHg 17 mmHg 112 bpm    10-29-2016, 11:08 AM 22 mmHg 8 mmHg 13 mmHg 98 bpm    10-27-2016, 09:17 AM 25 mmHg 9 mmHg 14 mmHg 94 bpm    10-26-2016, 06:31 AM 29 mmHg 11 mmHg 17 mmHg 84 bpm    10-25-2016, 06:32 AM 24 mmHg 11 mmHg 15 mmHg 107 bpm    10-24-2016, 10:27 AM 27 mmHg 10 mmHg 16 mmHg 85 bpm    10-22-2016, 07:26 AM 26 mmHg 9 mmHg 15 mmHg 84 bpm    10-21-2016, 03:49 PM 23 mmHg 9 mmHg 14 mmHg 102 bpm      Matthew Folks, RN, Engineer, civil (consulting)  Phone: [phone]  Pager: [phone]

## 2016-11-14 NOTE — Patient Education
Medication Education    Jakeryan Burnet accepted counseling and was engaged. he verbalized understanding.    The following medications were discussed:  lovenox  Gabapentin  mucinex  novolog  Melatonin  singulair  protonix    Where indicated, the patient was provided with additional medication and/or disease-state information. All patient questions were answered and patient acknowledged understanding of the medications, side effects and other pertinent medication information.    Follow up should occur daily.    Continue to address: indications     Pt also educated on fluid restriction, fall bundle, contact with mask precautions.     Dorena Bodo

## 2016-11-14 NOTE — Progress Notes
Spoke with this pt's RN this morning, and he is not going home until at least Thursday, so we will wait until tomorrow to administer the Exercise Oximetry to stay within 48 hours of patient's discharge date.

## 2016-11-14 NOTE — Care Plan
Problem: Infection, Risk of  Goal: Absence of infection  Outcome: Goal Ongoing  Pt in contact with mask for MRSA and positive Rhino virus. Pt educated on infection control.   ???  Problem: Discharge Planning  Goal: Prepared for discharge  Outcome: Goal Ongoing  Pt will d/c when medically stable  ???  Problem: Anxiety  Goal: Alleviation of anxiety  Outcome: Goal Ongoing  No c/o anxiety, continue to monitor  ???  Problem: Pain  Goal: Management of pain  Outcome: Goal Ongoing  No c/o pain   ???  Problem: Respiratory Impairment (Non-Ventilated Patient)  Goal: Effective gas exchange  Outcome: Goal Ongoing  Pt on 4L NC with Cpap at night. Pt verbalized that her uses oxygen PRN at home. Pt encouraged to do IS for SOA.   ???  Problem: Tissue Perfusion, Altered  Goal: Adequate tissue perfusion  Outcome: Goal Ongoing  Pt BLE cool to touch, edematous, cap refill 4 seconds.   ???  Problem: Skin Integrity  Goal: Skin integrity intact  Outcome: Goal Ongoing  Moisture break down in groin area, criticaid applied.   ???  Problem: Fluid Volume, Imbalanced  Goal: Absence of dehydration  Outcome: Goal Ongoing  No s/s of dehydration   Goal: Absence of fluid overload  Outcome: Goal Ongoing  Pt BLE edematous, soa, course lung sounds. Continue to monitor. Pt educated on fluid intake and monitoring I/O. Pt on fluid restriction 1.5L fluid restriction  ???  Problem: Neurological Status, Impaired/Altered  Goal: Cognitive status restored to baseline  Outcome: Goal Ongoing  Pt alert and oriented x4  ???  Problem: Self-Care Deficit  Goal: Maximize Heart Failure Knowledge  Outcome: Goal Ongoing  Pt verbalized understanding of HF   ???  Problem: VTE, Risk of  Goal: Absence of venous thrombosis  No s/s of VTE. Pt given heparin, refused SCD pumps due to leg pain.   ???  Problem: Mobility/Activity Intolerance  Goal: Maximize functional ADL's and mobility outcomes  Outcome: Goal Ongoing  Pt is independent of ADLs  ???  Problem: Falls, High Risk of Goal: Absence of falls-Adult Patient  Outcome: Goal Ongoing  No falls this shift, fall bundle in place. Problem: Infection, Risk of  Goal: Absence of infection  Outcome: Goal Ongoing  Pt in contact with mask for MRSA and positive Rhino virus. Pt educated on infection control.   ???  Problem: Discharge Planning  Goal: Prepared for discharge  Outcome: Goal Ongoing  Pt will d/c when medically stable  ???  Problem: Anxiety  Goal: Alleviation of anxiety  Outcome: Goal Ongoing  No c/o anxiety, continue to monitor  ???  Problem: Pain  Goal: Management of pain  Outcome: Goal Ongoing  No c/o pain   ???  Problem: Respiratory Impairment (Non-Ventilated Patient)  Goal: Effective gas exchange  Outcome: Goal Ongoing  Pt on 4L NC with Cpap at night. Pt verbalized that her uses oxygen PRN at home. Pt encouraged to do IS for SOA.   ???  Problem: Tissue Perfusion, Altered  Goal: Adequate tissue perfusion  Outcome: Goal Ongoing  Pt BLE cool to touch, edematous, cap refill 4 seconds.   ???  Problem: Skin Integrity  Goal: Skin integrity intact  Outcome: Goal Ongoing  Moisture break down in groin area, criticaid applied.   ???  Problem: Fluid Volume, Imbalanced  Goal: Absence of dehydration  Outcome: Goal Ongoing  No s/s of dehydration   Goal: Absence of fluid overload  Outcome: Goal Ongoing  Pt BLE edematous, soa, course  lung sounds. Continue to monitor. Pt educated on fluid intake and monitoring I/O. Pt on fluid restriction 1.5L fluid restriction  ???  Problem: Neurological Status, Impaired/Altered  Goal: Cognitive status restored to baseline  Outcome: Goal Ongoing  Pt alert and oriented x4  ???  Problem: Self-Care Deficit  Goal: Maximize Heart Failure Knowledge  Outcome: Goal Ongoing  Pt verbalized understanding of HF   ???  Problem: VTE, Risk of  Goal: Absence of venous thrombosis  No s/s of VTE. Pt given heparin, refused SCD pumps due to leg pain.   ???  Problem: Mobility/Activity Intolerance  Goal: Maximize functional ADL's and mobility outcomes Outcome: Goal Ongoing  Pt is independent of ADLs  ???  Problem: Falls, High Risk of  Goal: Absence of falls-Adult Patient  Outcome: Goal Ongoing  No falls this shift, fall bundle in place.

## 2016-11-14 NOTE — Progress Notes
Pt and wife spoke to this RN about their concerns for fluid restriction. Per pt "cardiology said he needs more fluids".   Per Cardiology note 11/13/16 "liberalize fluids to 2L". MPG paged

## 2016-11-14 NOTE — Progress Notes
Teaching Physician Attestation:  I have personally interviewed and examined the patient, have reviewed the documentation, and agree with the assessment and plan of the resident.  He feels improved.  He has much less wheeze.  His lungs are actually clear.  His cardio mems shows a PA diastolic of 8.  He still has some mild lower extremity edema and some venous stasis changes.  Going to ask the EP team to see him for adjustments of his Bi V pacing and rate recognition.  We will continue with his current treatment plan.  He is nearing the time where it may be safe for discharge.  Lazarus Gowda, MD

## 2016-11-14 NOTE — Progress Notes
General Progress Note      Today's Date:  11/14/2016  Admission Date: 11/07/2016  LOS: 7 days                     Assessment/Plan:    Active Problems:    OSA on CPAP    GERD (gastroesophageal reflux disease)    CAD (coronary artery disease)    AAA (abdominal aortic aneurysm) (HCC)    CKD (chronic kidney disease) stage 3, GFR 30-59 ml/min (HCC)    Chronic combined systolic and diastolic congestive heart failure, NYHA class 2 (HCC)    Chronic obstructive pulmonary disease (HCC)    Ischemic cardiomyopathy    Moderate persistent asthma with acute exacerbation    Iron deficiency    Acute respiratory failure with hypoxia (HCC)    Severe sepsis (HCC)    Lactic acid acidosis    Hyperglycemia    Rhinovirus infection      76 y/o M???w/ PMH depression, chronic pain, asthma, CKD, HTN, HLD, CAD, ischemic cardiomyopathy s/p pacer, ICD &???cardioMEMS implant. Presented 9/18 in transfer from [facility] w/ acute hypoxic???respiratory failure. Presented there 9/13 w/ persistent cough causing SOA &???was treated w/ levaquin &???steroid. On 9/18 had progressive increase in O2 requirement &???placed on BiPAP prior to transfer to [facility]. RVP +rhinovirus &???sputum gram stain w/???many GPC resembling strep (despite being on levaquin). Have narrowed antbx to augmentin to complete 7d. Remains on 4L HFNC w/???significant cough; continuing solumedrol, duonebs &???diuresis. Consulted heart failure team &???increased Bumex while on IV steroids. He transferred to the floor on 9/23.    Acute Respiratory Failure w/ Hypoxia; Cough   Severe Sepsis, Rhinovirus  Hx Allergic Asthma; Bronchiectasis   - Multifactorial 2/2 viral/bacterial infection vs asthma exacerbation vs bronchitis vs volume overload (w/ recent decrease in diuretic dosing ~8/21)  - Follows w/ Dr. 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 - Has been on Zyflo and Xolair in the past; not currently  - Was given z-pak & 5 day course of pred 8/14; then 9/10 given doxycycline & presented to ED at OSH 9/13 w/ SOB  - Tx w/ levaquin & methylpred (progressive increase in dose)  - OSH BNP 1200 on 9/18 (up from 670 on 9/13)  - Had progressive increase in O2 up to 12LNC &???then BiPAP  - On admit, NIV 8/5 60%  - ABG on admit: 7.41/39/115/24.8  - BNP 92  - RVP: +rhinovirus  - Blood cx 9/18: ngtd  - Sputum cx 9/18: GS with many GPC resembling strep; cx normal flora  - CXR w/ development of right basilar consolidation -> when compared to CT - ? percardial fat  Plan  - Given normalization of PA pressures, resumed PTA diuretics as below (w/ increased bicarb)  - Continue methylpred 62.5mg  q8h->possible transition to po prednisone today-will await pulm reccs  - Continue PTA duonebs & symbicort/singulair  - Continue PTA tessalon perles, zyrtec, & guaifenesin w/ codeine, mucinex, acapella  - Pulmonary following  - Continue augmentin PO to complete 7d through 9/26  - will order ex ox in AM to inquire about home o2 needs, patient has Oxygen at home but was only using prn    Combined Systolic & Diastolic Heart Failure; HTN, HLD  CAD & Ischemic Cardiomyopathy s/p Pacemaker, CRT-D  - Echo 05/04/16: LVEF 40% (had  been as low as 20%), RV w/ borderline low EF, LA mildly dilated, RA normal; mild-mod MR, mild TR  - Of note, bumex had been increased in June & then reduced from 4mg  BID to 2mg  BID on 8/21 due to elevations in BUN/Cr  - BNP 92  - EKG paced rhythm, rate 90s, trop negative  - Repeat echo 9/18: LVEF 45-50%; RV normal function; mild to mod MR; no pericardial effusion  - Bubble study negative 9/19  - CardioMEMS read on 9/17 PAP 42/20 (baseline appears 20s over ~10)  - CardioMEMS read on 9/20???PAP 22/7 (baseline)???-->???cardiology will recheck daily  - ICD interrogated 9/21  - SBP 120s-130s, HR 70s-80s  Plan  - Continue PTA lipitor & ASA  - Continue PTA metoprolol XL 25mg  daily - Changed to PTA Bumex to 2mg  BID while receiving IV steroids???&???spironolactone 25mg  BID  - 2L fluid restrirction  - Heart failure following   - Will arrange for HF follow up at time of discharge  - net - in last 24 hours    LE Swelling/Discoloration (LLE>RLE)  - Pt reports he has had vascular surgery to BLE  - Lower legs/feet erythematous & purple. 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 Patent trifurcation arteries to the ankle bilaterally.  - evaluated by vascular surgery on 9/23->no signs of arterial insufficiency requiring surgical intervention.  Double layered tubigrip recommended along with diuresis.      Hyperglycemia  - In setting of steroid  - BS mid-high 100s-200s???last 24 hrs  - Continue HDCF (36u/24h)  - Should improve when off steroids    Iron Deficiency Anemia  - Iron studies 8/17: iron 111, % sat low 22, TIBC high 499, ferritin 38  - S/p IV iron infusion 8/21  - Continue???PTA iron    AAA  - S/p endovascular repair  - Follows w/???Dr. Chales Abrahams    OSA  - CPAP qHS -->has been on bipap here qHS 8/5 50%    Depression  - Continue PTA cymbalta     Chronic Low Back Pain  - Follows at [facility]  - 2/2 falling off roof; has had epidural injections in the past  - Continue PTA amitriptyline/gabapentin/emu oil, gabapentin, & lidoderm patches  - Holding PTA tylenol 3, oxycodone, tizanidine    DVT PPX: Lovenox  Lines/Foley: PIV  Code Status: Full Code  Dispo: inpt    Isidoro Donning, MD  [facility]  [phone]    ________________________________________________________________________    Subjective  Ruben Wood. is a 76 y.o. male.  Patient states he continues to feel better. States he continues to cough up sputum and feels better once he does.  States sob and wheezing much improved.  Not interested in home health at discharge.     Review of Systems:  All other systems reviewed and are negative. except for dyspnea and wheezing    Medications  Scheduled Meds:    albuterol 0.5% (PROVENTIL; VENTOLIN) nebulizer solution 2.5 mg 2.5 mg Inhalation Q6H While Awake & PRN   allopurinol (ZYLOPRIM) tablet 300 mg 300 mg Oral QDAY   amitriptyline/gabapentin/emu oil(#) 05/24/08 % topical cream 5 g 5 g Topical TID   amoxicillin/K clavulanate (AUGMENTIN) tablet 875 mg 875 mg Oral BID w/meals   ascorbic acid (VITAMIN C) tablet 500 mg 500  mg Oral QDAY   aspirin tablet 325 mg 325 mg Oral QDAY   atorvastatin (LIPITOR) tablet 20 mg 20 mg Oral QDAY   budesonide/formoterol (SYMBICORT HFA) 160/4.5 mcg inhalation 2 puff 2 puff Inhalation BID   bumetanide (BUMEX) tablet 2 mg 2 mg Oral BID(9-17)   cetirizine (ZYRTEC) tablet 10 mg 10 mg Oral QAM8   cholecalciferol (VITAMIN D-3) tablet 1,000 Units 1,000 Units Oral QDAY   duloxetine DR (CYMBALTA) capsule 60 mg 60 mg Oral QDAY   enoxaparin (LOVENOX) syringe 40 mg 40 mg Subcutaneous BID   ferrous sulfate (FEOSOL, FEROSUL) tablet 325 mg 325 mg Oral QDAY   gabapentin (NEURONTIN) capsule 400 mg 400 mg Oral Q8H*   guaiFENesin LA (MUCINEX) tablet 600 mg 600 mg Oral BID   insulin aspart U-100 (NOVOLOG FLEXPEN) injection PEN 0-28 Units 0-28 Units Subcutaneous ACHS   ipratropium bromide (ATROVENT) 0.02 % nebulizer solution 0.5 mg 0.5 mg Inhalation Q6H While Awake & PRN   melatonin tablet 3 mg 3 mg Oral QHS   methylPREDNISolone (SOLU-MEDROL PF) injection 62.5 mg 62.5 mg Intravenous Q8H   metoprolol XL (TOPROL XL) tablet 25 mg 25 mg Oral QDAY   montelukast (SINGULAIR) tablet 10 mg 10 mg Oral QHS   pantoprazole DR (PROTONIX) tablet 40 mg 40 mg Oral BID   polyethylene glycol 3350 (MIRALAX) packet 17 g 1 packet Oral QDAY   senna/docusate (SENOKOT-S) tablet 1 tablet 1 tablet Oral BID   spironolactone (ALDACTONE) tablet 25 mg 25 mg Oral BID(9-17) Continuous Infusions:  PRN and Respiratory Meds:acetaminophen Q6H PRN, benzonatate TID PRN, codeine/guaiFENesin Q4H PRN, ondansetron Q6H PRN      Objective                       Vital Signs: Last Filed                 Vital Signs: 24 Hour Range   BP: 133/70 (09/25 1137)  Temp: 36.4 ???C (97.6 ???F) (09/25 1137)  Pulse: 94 (09/25 1137)  Respirations: 18 PER MINUTE (09/25 1137)  SpO2: 95 % (09/25 1137)  O2 Delivery: Nasal Cannula (09/25 1137) BP: (128-155)/(60-81)   Temp:  [36.4 ???C (97.6 ???F)-36.7 ???C (98.1 ???F)]   Pulse:  [90-106]   Respirations:  [16 PER MINUTE-20 PER MINUTE]   SpO2:  [93 %-97 %]   O2 Delivery: Nasal Cannula     Vitals:    11/11/16 0000 11/12/16 1451 11/14/16 0536   Weight: (!) 140.6 kg (310 lb) (!) 138.8 kg (306 lb) (!) 137.3 kg (302 lb 12.8 oz)       Intake/Output Summary:  (Last 24 hours)    Intake/Output Summary (Last 24 hours) at 11/14/16 1329  Last data filed at 11/14/16 1130   Gross per 24 hour   Intake             1080 ml   Output             4070 ml   Net            -2990 ml      Stool Occurrence: 1    Physical Exam  General Appearance: No apparent distress, sitting upright in chair, obese  Skin: no rashes  HEENT:???PERRL, MMM  Chest and Lungs:???Coarse/tight breath sounds posteriorly, +wheezing, +ronchi  Cardiovascular:???RRR  Abdomen:???soft/obese, nondistended, +BS  Genitourinary: voiding  Extremities:???warm, 1+ LE edema, venous stasis changes to BLE, Left Feet/ankle appear purple, cool-warm to touch. DP pulses palpable bilaterally  Neurologic:???alert &???  oriented x3, able to move all extremities, non-focal      Lab Review  Pertinent labs reviewed personally    Point of Care Testing  (Last 24 hours)  Glucose: (!) 199 (11/14/16 0534)  POC Glucose (Download): (!) 233 (11/14/16 1135)    Radiology and other Diagnostics Review (Tele/EKG/Echo):    No pertinent radiology.    Alda Berthold, MD

## 2016-11-14 NOTE — Progress Notes
Pulmonary Progress Note         Name:  Ruben Wood.   Today's Date:  11/14/2016  Admission Date: 11/07/2016  LOS: 7 days           Impression:   76 y/o M???w/ PMH depression, chronic pain, asthma, CKD, HTN, HLD, CAD, ischemic cardiomyopathy s/p pacer, ICD &???cardioMEMS implant. Presented 9/18 in transfer from [facility] w/ acute hypoxic???respiratory failure. Presented there 9/13 w/ persistent cough causing SOA &???was treated w/ levaquin &???steroid. On 9/18 had progressive increase in O2 requirement &???placed on BiPAP prior to transfer. RVP +rhinovirus &???sputum gram stain w/???many GPC resembling strep (despite being on levaquin).  ???  Ischemic cardiomyopathy s/p pacer, ICD &???cardioMEMS implant  - Cardio MEMS >> PA diastolic 9 today (goal 5-10)  - EF 50% on recent echo (limited study)  - Continues on Bumex and spironolactone  - Cardiology following  ???  Acute Respiratory failure with hypoxia; cough, allergic asthma and bronchiectasis  - Multifactorial 2/2 positive rhinovirus and staph infection vs asthma exacerbation vs volume overload   - Follows with Dr [name]  - PRN oxygen at 4L NC   - History of mixed obstructive and restrictive defects on PFTs  - Treated with oral prednisone and Z-pack after visit with Dr. [name] on 10/03/16  - CXR 9/18- right basilar consolidation likely pericardial fat  ???  OSA  - CPAP qhs  ???  Severe Sepsis (resolved), Rhinovirus  - Has had waxing/waning cough for the last month - hx allergic rhinitis & asthma but not improved -> had been given a z-pak 8/21 & then started on doxy 9/10 which he continued till admission to OSH 9/13  - At OSH, he received levaquin 9/13, and then daily starting 9/16  - WBC 9.9???this AM; afebrile  - UA w/o evidence of infection  - Procal 0.08  - RVP: +rhinovirus  - Blood cx 9/18: ngtd5  - Sputum cx 9/18: GS with many GPC resembling strep; cx normal flora  - Continue augmentin PO to complete 7d through 9/26 Recommendations:     - Stop Solu-medrol and start 60mg  Prednisone   >> Plan for slow taper, decrease by 10mg  every 5 days   - EX OX prior to discharge to assess home oxygen needs  - Has follow-up with Dr. [name] November 27th, will discuss the need for earlier follow-up with Dr. [name]   - Continue PTA Symbicort and duonebs  - Aggressive pulmonary hygiene with IS and Aerobika   - Diuretics per cardiology         >> Cardio MEMS with PA diastolic 8 today    We will continue to follow. Above plan discussed with attending physician, Dr. Pincus Sanes, APRN-NP  Pager [phone]  ________________________________________________________________________    Subjective  Patient reports feeling much better today. Was able to get up a lot of sputum and believes he is breathing easier.     ROS: Denies fever, chills, nausea, vomiting and diarrhea.     Objective  Vital Signs: Last Filed Vital Signs: 24 Hour Range   BP: 133/70 (09/25 1137)  Temp: 36.4 ???C (97.6 ???F) (09/25 1137)  Pulse: 94 (09/25 1137)  Respirations: 18 PER MINUTE (09/25 1137)  SpO2: 95 % (09/25 1137)  O2 Delivery: Nasal Cannula (09/25 1137) BP: (128-155)/(60-81)   Temp:  [36.4 ???C (97.6 ???F)-36.7 ???C (98.1 ???F)]   Pulse:  [90-106]   Respirations:  [16 PER MINUTE-20 PER MINUTE]   SpO2:  [  93 %-96 %]   O2 Delivery: Nasal Cannula       Intake/Output Summary:  (Last 24 hours)    Intake/Output Summary (Last 24 hours) at 11/14/16 1519  Last data filed at 11/14/16 1130   Gross per 24 hour   Intake             1080 ml   Output             3395 ml   Net            -2315 ml       General: Awake and alert, in no acute distress.  HEENT: NC/AT, sclera non-icteric, moist mucus membranes.  Neck: no JVD.  CV: regular rhythm, normal rate, no murmurs.  Lungs: Coarse throughout with wheezing (although much improved lung sounds from yesterday's assessment)  Abdomen: soft, non-tender, non-distended, normo-active bowel sounds Extremities: no edema, cool and discolored BLE, pedal pulses 2 (+) bilaterally  Neuro: no focal deficits  Skin: no rashes    Lab Review  Recent CBC   Recent Labs      11/12/16   0605  11/13/16   0523   WBC  8.6  9.9   HGB  13.1*  14.2   HCT  39.1*  42.4   PLTCT  146*  155   MCV  104.7*  105.2*        Recent CMP   Recent Labs      11/12/16   0605  11/13/16   0523  11/14/16   0534   NA  137  134*  134*   K  4.1  4.1  4.2   CL  99  96*  95*   CO2  27  27  27    GAP  11  11  12    BUN  56*  57*  57*   CR  1.28*  1.29*  1.25*   GFR  55*  54*  56*   GLU  205*  211*  199*   CA  8.3*  8.7  8.9   MG  2.3  2.3  2.2   PO4  3.2  2.8  3.1   ALBUMIN  3.4*  3.6  3.7   ALKPHOS  54  58  66   AST  22  22  24    ALT  23  29  34   TOTBILI  1.2  1.2  1.3*        Other labs   24-hour labs:    Results for orders placed or performed during the hospital encounter of 11/07/16 (from the past 24 hour(s))   POC GLUCOSE    Collection Time: 11/13/16  5:03 PM   Result Value Ref Range    Glucose, POC 189 (H) 70 - 100 MG/DL   POC GLUCOSE    Collection Time: 11/13/16  9:19 PM   Result Value Ref Range    Glucose, POC 243 (H) 70 - 100 MG/DL   COMPREHENSIVE METABOLIC PANEL    Collection Time: 11/14/16  5:34 AM   Result Value Ref Range    Sodium 134 (L) 137 - 147 MMOL/L    Potassium 4.2 3.5 - 5.1 MMOL/L    Chloride 95 (L) 98 - 110 MMOL/L    Glucose 199 (H) 70 - 100 MG/DL    Blood Urea Nitrogen 57 (H) 7 - 25 MG/DL    Creatinine 1.61 (H) 0.4 - 1.24 MG/DL    Calcium 8.9 8.5 - 09.6  MG/DL    Total Protein 6.2 6.0 - 8.0 G/DL    Total Bilirubin 1.3 (H) 0.3 - 1.2 MG/DL    Albumin 3.7 3.5 - 5.0 G/DL    Alk Phosphatase 66 25 - 110 U/L    AST (SGOT) 24 7 - 40 U/L    CO2 27 21 - 30 MMOL/L    ALT (SGPT) 34 7 - 56 U/L    Anion Gap 12 3 - 12    eGFR Non African American 56 (L) >60 mL/min    eGFR African American >60 >60 mL/min   MAGNESIUM    Collection Time: 11/14/16  5:34 AM   Result Value Ref Range    Magnesium 2.2 1.6 - 2.6 mg/dL   PHOSPHORUS Collection Time: 11/14/16  5:34 AM   Result Value Ref Range    Phosphorus 3.1 2.0 - 4.5 MG/DL   POC GLUCOSE    Collection Time: 11/14/16 11:35 AM   Result Value Ref Range    Glucose, POC 233 (H) 70 - 100 MG/DL        Point of Care Testing  (Last 24 hours)  Glucose: (!) 199 (11/14/16 0534)  POC Glucose (Download): (!) 233 (11/14/16 1135)    Radiology and other Diagnostics Review:    No new imaging to review    Medications  Scheduled IV PRN     albuterol 0.5% (PROVENTIL; VENTOLIN) nebulizer solution 2.5 mg 2.5 mg Inhalation Q6H While Awake & PRN   allopurinol (ZYLOPRIM) tablet 300 mg 300 mg Oral QDAY   amitriptyline/gabapentin/emu oil(#) 05/24/08 % topical cream 5 g 5 g Topical TID   amoxicillin/K clavulanate (AUGMENTIN) tablet 875 mg 875 mg Oral BID w/meals   ascorbic acid (VITAMIN C) tablet 500 mg 500 mg Oral QDAY   aspirin tablet 325 mg 325 mg Oral QDAY   atorvastatin (LIPITOR) tablet 20 mg 20 mg Oral QDAY   budesonide/formoterol (SYMBICORT HFA) 160/4.5 mcg inhalation 2 puff 2 puff Inhalation BID   bumetanide (BUMEX) tablet 2 mg 2 mg Oral BID(9-17)   cetirizine (ZYRTEC) tablet 10 mg 10 mg Oral QAM8   cholecalciferol (VITAMIN D-3) tablet 1,000 Units 1,000 Units Oral QDAY   duloxetine DR (CYMBALTA) capsule 60 mg 60 mg Oral QDAY   enoxaparin (LOVENOX) syringe 40 mg 40 mg Subcutaneous BID   ferrous sulfate (FEOSOL, FEROSUL) tablet 325 mg 325 mg Oral QDAY   gabapentin (NEURONTIN) capsule 400 mg 400 mg Oral Q8H*   guaiFENesin LA (MUCINEX) tablet 600 mg 600 mg Oral BID   insulin aspart U-100 (NOVOLOG FLEXPEN) injection PEN 0-28 Units 0-28 Units Subcutaneous ACHS   ipratropium bromide (ATROVENT) 0.02 % nebulizer solution 0.5 mg 0.5 mg Inhalation Q6H While Awake & PRN   melatonin tablet 3 mg 3 mg Oral QHS   methylPREDNISolone (SOLU-MEDROL PF) injection 62.5 mg 62.5 mg Intravenous Q8H   metoprolol XL (TOPROL XL) tablet 25 mg 25 mg Oral QDAY   montelukast (SINGULAIR) tablet 10 mg 10 mg Oral QHS pantoprazole DR (PROTONIX) tablet 40 mg 40 mg Oral BID   polyethylene glycol 3350 (MIRALAX) packet 17 g 1 packet Oral QDAY   senna/docusate (SENOKOT-S) tablet 1 tablet 1 tablet Oral BID   spironolactone (ALDACTONE) tablet 25 mg 25 mg Oral BID(9-17)     acetaminophen Q6H PRN, benzonatate TID PRN, codeine/guaiFENesin Q4H PRN, ondansetron Q6H PRN

## 2016-11-15 ENCOUNTER — Encounter: Admit: 2016-11-15 | Discharge: 2016-11-15 | Payer: MEDICARE

## 2016-11-15 LAB — MAGNESIUM: Lab: 2.2 mg/dL — ABNORMAL LOW (ref 1.6–2.6)

## 2016-11-15 LAB — POC GLUCOSE
Lab: 357 mg/dL — ABNORMAL HIGH (ref 70–100)
Lab: 184 mg/dL — ABNORMAL HIGH (ref 70–100)
Lab: 194 mg/dL — ABNORMAL HIGH (ref 70–100)
Lab: 133 mg/dL — ABNORMAL HIGH (ref 70–100)
Lab: 248 mg/dL — ABNORMAL HIGH (ref >60)
Lab: >60 mg/dL — ABNORMAL HIGH (ref >60)

## 2016-11-15 LAB — PHOSPHORUS: Lab: 3.5 mg/dL — ABNORMAL LOW (ref >60)

## 2016-11-15 LAB — CBC AND DIFF
Lab: 13 K/UL — ABNORMAL HIGH (ref >60)
Lab: 39 % — ABNORMAL LOW (ref 40–50)

## 2016-11-15 LAB — COMPREHENSIVE METABOLIC PANEL: Lab: 133 MMOL/L — ABNORMAL LOW (ref >60)

## 2016-11-15 NOTE — Progress Notes
All ?     Taken on PA Systolic PA Diastolic PA Mean Heart Rate    11-15-2016, 11:35 AM 14 mmHg 5 mmHg 8 mmHg 93 bpm    11-14-2016, 09:30 AM 20 mmHg 8 mmHg 12 mmHg 103 bpm    11-13-2016, 10:05 AM 22 mmHg 9 mmHg 14 mmHg 101 bpm    11-10-2016, 09:33 AM 31 mmHg 13 mmHg 19 mmHg 87 bpm    11-09-2016, 11:20 AM 22 mmHg 7 mmHg 11 mmHg 74 bpm    11-08-2016, 10:40 AM 36 mmHg 16 mmHg 23 mmHg 88 bpm    11-06-2016, 06:55 PM 42 mmHg 20 mmHg 28 mmHg 101 bpm    11-01-2016, 11:20 AM 21 mmHg 7 mmHg 12 mmHg 105 bpm    10-31-2016, 10:48 AM 23 mmHg 7 mmHg 13 mmHg 97 bpm    10-30-2016, 12:50 PM 27 mmHg 9 mmHg 17 mmHg 112 bpm

## 2016-11-15 NOTE — Progress Notes
Daily CardioMEMS Readings      Goal PA Diastolic: 5-10 mmHg    Taken on PA Systolic PA Diastolic PA Mean Heart Rate    11-15-2016, 11:35 AM 14 mmHg 5 mmHg 8 mmHg 93 bpm    11-14-2016, 09:30 AM 20 mmHg 8 mmHg 12 mmHg 103 bpm    11-13-2016, 10:05 AM 22 mmHg 9 mmHg 14 mmHg 101 bpm    11-10-2016, 09:33 AM 31 mmHg 13 mmHg 19 mmHg 87 bpm    11-09-2016, 11:20 AM 22 mmHg 7 mmHg 11 mmHg 74 bpm    11-08-2016, 10:40 AM 36 mmHg 16 mmHg 23 mmHg 88 bpm    11-06-2016, 06:55 PM 42 mmHg 20 mmHg 28 mmHg 101 bpm    11-01-2016, 11:20 AM 21 mmHg 7 mmHg 12 mmHg 105 bpm    10-31-2016, 10:48 AM 23 mmHg 7 mmHg 13 mmHg 97 bpm    10-30-2016, 12:50 PM 27 mmHg 9 mmHg 17 mmHg 112 bpm    10-29-2016, 11:08 AM 22 mmHg 8 mmHg 13 mmHg 98 bpm    10-27-2016, 09:17 AM 25 mmHg 9 mmHg 14 mmHg 94 bpm    10-26-2016, 06:31 AM 29 mmHg 11 mmHg 17 mmHg 84 bpm    10-25-2016, 06:32 AM 24 mmHg 11 mmHg 15 mmHg 107 bpm    10-24-2016, 10:27 AM 27 mmHg 10 mmHg 16 mmHg 85 bpm    10-22-2016, 07:26 AM 26 mmHg 9 mmHg 15 mmHg 84 bpm    10-21-2016, 03:49 PM 23 mmHg 9 mmHg 14 mmHg 102 bpm        Matthew Folks, RN, Engineer, civil (consulting)  Phone: [phone]  Pager: [phone]

## 2016-11-15 NOTE — Patient Education
Medication Education    Ruben Wood accepted counseling and was receptive.  he needs reinforcement.    The following medications were discussed:  Prednisone, lipitor, bumex, zyrtec, cymbalta, iron, lovenox, mucinex, novolog, metoprolol, miralax, senna, aldactone, gabapentin,     Where indicated, the patient was provided with additional medication and/or disease-state information.  All patient questions were answered and patient acknowledged understanding of the medications, side effects and other pertinent medication information.    Follow up should occur daily.    Continue to address: indications    [name], RN

## 2016-11-15 NOTE — Progress Notes
Notified Dr. Marta Antu and cardiology that patients has had 7 and 5 beat run of Vtach. Dr. Marta Antu states she will follow up with cardiology to discuss pacemaker settings. PT asymptomatic, vitals stable. Will continue to monitor

## 2016-11-15 NOTE — Progress Notes
Pt having runs of vtach since 1600, the longest a 7 beat run. Pacer also intermittently reads as not capturing.  Notified Dr.Safder, she requested I notify Cardiology. Paged Dr. Vivianne Spence, states he will have someone to interrogate the device. Pt asymptomatic, vitals stable. Will continue to monitor.

## 2016-11-15 NOTE — Progress Notes
Teaching Physician Attestation:  I have personally interviewed and examined the patient, have reviewed the documentation, and agree with the assessment and plan of the resident.  He continues to improve.  He continues to have some wheezing.  His cardio mems shows a PA diastolic of only 5.  Would like to continue with his current volume management.  He has had some nonsustained ventricular tachycardia.  His hemoglobin is 13.6 platelet count 117,000 white blood cell count 13.6 creatinine 1.36 potassium is 4.0.  We will continue with his current treatment plan.  He looks like he is approaching time for discharge.  Lazarus Gowda, MD

## 2016-11-15 NOTE — Patient Education
Medication Education    Xzayden Longhurst accepted counseling and was interactive.  he demonstrated understanding.    The following medications were discussed:  Amitriptyline/gabapentin/emu oil, Lovenox, gabapentin, mucinex, insulin aspart, melatonin, protonix, senna.docusate.     Where indicated, the patient was provided with additional medication and/or disease-state information.  All patient questions were answered and patient acknowledged understanding of the medications, side effects and other pertinent medication information.    Follow up should occur daily.    Continue to address: side effects    Heels checked. Feet cool. SCD refused and legs elevated when patient returned to bed.     Christiana Pellant, RN

## 2016-11-15 NOTE — Progress Notes
PHYSICAL THERAPY  PROGRESS NOTE       MOBILITY:  Mobility  Progressive Mobility Level: Walk in hallway  Distance Walked (feet): 80 ft  Level of Assistance: Stand by assistance  Assistive Device: Walker  Time Tolerated: 11-30 minutes  Activity Limited By: Fatigue;Shortness of air    SUBJECTIVE:  Subjective  Significant hospital events: PMHx includes: depression, chronic back pain, asthma, CKD, HTN, HLD, CAD, ischemic cardiomyopathy, s/p PM, ICD, chronic back pain. Transferred from OSH 11/07/16 with acute respiratory failure and + rhinovirus.  Mental / Cognitive Status: Alert;Oriented;Cooperative  Persons Present: Physical Therapist;Significant Other  Pain: Patient has no complaint of pain  Comments: 4L O2  Ambulation Assist: Independent Mobility in Community with Endurance Limitations (and cane)  Patient Owned Equipment: Single [employer] Situation: Lives with Family (wife)  Type of Home: House  Entry Stairs: 3-5 Stairs (4)  In-Home Stairs: No Stairs    BED MOBILITY/TRANSFERS:  Bed Mobility/Transfers  Bed Mobility: Rolling: Standby Assist;Bed Flat;No Rail  Bed Mobility: Supine to Sit: Standby Assist;Bed Flat;No Rail  Bed Mobility: Sit to Supine: Standby Assist;Bed Flat;No Rail  Comments: Patient able to perform all bed mobility with standby assist at same height/setup as bed at home  Transfer Type: Sit to Stand (x2)  Transfer: Assistance Level: To/From;Bed Side Chair;Independent  Transfer: Assistive Device: Nurse, adult  Transfers: Type Of Assistance: Materials engineer;For Safety Considerations (for hand placement and walker management)    GAIT:  Gait  Gait Distance: 80 feet  Gait: Assistance Level: Standby Assist  Gait: Assistive Device: Technical brewer: Descriptors: Forward trunk flexion;Pace: Slow  Comments: ambulated to stairs and back  Stairs: Number Climbed: 5  Stairs: Descriptors: Ascend;Descend;Reciprocal  Stairs: Assistance Level: Standby Assist (manage oxygen)  Stairs: Assistive Device: Two Rails Activity Limited By: Complaint of Fatigue;SOA  Comments: Monitored oxygen before and after ambulation. With 4L O2, oxygen sat remained above 93% with seated rest break    ASSESSMENT/PROGRESS:  Assessment/Progress  Impaired Mobility Due To: Medical Status Limitation;Impaired Balance;Decreased Activity Tolerance;Deconditioning  Assessment/Progress: Expect Good Progress  Comments: Patient able to complete stairs necessary to enter home with standby assistance. Patient limited by decreased endurance and medical status.  AM-PAC 6 Clicks Basic Mobility Inpatient  Turning from your back to your side while in a flat bed without using bed rails: None  Moving from lying on your back to sitting on the side of a flatbed without using bedrails : None  Moving to and from a bed to a chair (including a wheelchair): A Little  Standing up from a chair using your arms (e.g. wheelchair, or bedside chair): None  To walk in hospital room: A Little  Climbing 3-5 steps with a railing: A Little  Raw Score: 21  Standardized (T-scale) Score: 45.55  Basic Mobility CMS 0-100%: 29.52  CMS G Code Modifier for Basic Mobility: CJ    GOALS:  Goals  Goal Formulation: With Patient  Time For Goal Achievement: 3 days, To, 7 days  Pt Will Go Supine To/From Sit: w/ Stand By Assist, Met  Pt Will Transfer Sit to Stand: w/ Stand By Assist, Met  Pt Will Ambulate: 51-100 Feet, w/ Cane, w/ Stand By Assist, Ongoing (3 reps with 2 minute seated rest between)  Pt Will Go Up / Down Stairs: 3-5 Stairs, w/ Stand By Assist, Met (using handrail)    PLAN:  Plan   Treatment Interventions: Mobility Training;Strengthening;Balance Activities;Endurance Training  Plan Frequency: 5 Days per Week  PT Plan for  Next Visit: ncrease ambulation distance and bouts; trial cane as able    RECOMMENDATIONS:  PT Discharge Recommendations  PT Discharge Recommendations: Home with Assistance;and;Home Health Setting  Equipment Recommendations: Roller Walker Comments: Patient requires the use of a walker with wheels to complete ADL???s in the home including meal preparation, ambulation the bathroom for toileting, bathing and grooming, and safe home mobility.  Patient is unable to complete these ADL???s with a cane or crutch and can safely use the walker.   Recommend ongoing assistance for: In and out of house;Ambulation;Stairs    Therapist: Lauretta Chester, PTA  Date: 11/15/2016

## 2016-11-15 NOTE — Progress Notes
Pulmonary Progress Note         Name:  Ruben Wood.   Today's Date:  11/15/2016  Admission Date: 11/07/2016  LOS: 8 days           Impression:   76 y/o M???w/ PMH depression, chronic pain, asthma, CKD, HTN, HLD, CAD, ischemic cardiomyopathy s/p pacer, ICD &???cardioMEMS implant. Presented 9/18 in transfer from [facility] w/ acute hypoxic???respiratory failure. Presented there 9/13 w/ persistent cough causing SOA &???was treated w/ levaquin &???steroid. On 9/18 had progressive increase in O2 requirement &???placed on BiPAP prior to transfer. RVP +rhinovirus &???sputum gram stain w/???many GPC resembling strep (despite being on levaquin).  ???  Ischemic cardiomyopathy s/p pacer, ICD &???cardioMEMS implant  - Cardio MEMS >> PA diastolic 5 today (9/26) (goal 1-61)  - EF 50% on recent echo (limited study)  - Continues on Bumex and spironolactone  - Cardiology following  ???  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 [name] on 10/03/16  - CXR 9/18- right basilar consolidation likely pericardial fat  ???  OSA  - CPAP qhs  - Has home machine here with him, adjusted setting to 6-20 (9/26)  - Machine shows good compliance with therapy and a residual AHI 1.0  ???  Severe Sepsis (resolved), Rhinovirus  - Has had waxing/waning cough for the last month - hx allergic rhinitis & asthma but not improved -> had been given a z-pak 8/21 & then started on doxy 9/10 which he continued till admission to OSH 9/13  - At OSH, he received levaquin 9/13, and then daily starting 9/16  - WBC 9.9???this AM; afebrile  - UA w/o evidence of infection  - Procal 0.08  - RVP: +rhinovirus - Blood cx 9/18: ngtd5  - Sputum cx 9/18: GS with many GPC resembling strep; cx normal flora  - Continue augmentin PO to complete 7d through 9/26    Recommendations:     - Continue Prednisone taper, currently on day 1 of 60mg                >> Plan for slow taper, decrease by 10mg  every 5 days   - Patient reporting CPAP pressure feels low, pressure changed from 4-20 to 6-20   - EX OX prior to discharge to assess home oxygen needs  - Has follow-up with Dr. [name] November 27th, will discuss the need for earlier follow-up with Dr. [name]   - Continue PTA Symbicort and duonebs  - Aggressive pulmonary hygiene with IS and Aerobika   - Diuretics per cardiology  ?????????????????????>>???Cardio MEMS with PA diastolic 5 today (goal 5-10)    We will continue to follow. Above plan discussed with attending physician, Dr. Pincus Sanes, APRN-NP  Pager [phone]   ________________________________________________________________________    Subjective  Patient continues to feel better. Shortness of breath improved. Mobilizing secretions.     ROS: Denies fever, chills, nausea, vomiting and diarrhea    Objective  Vital Signs: Last Filed Vital Signs: 24 Hour Range   BP: 143/86 (09/26 1134)  Temp: 36.2 ???C (97.2 ???F) (09/26 1134)  Pulse: 102 (09/26 1134)  Respirations: 18 PER  MINUTE (09/26 1134)  SpO2: 94 % (09/26 1134)  O2 Delivery: Nasal Cannula (09/26 1134) BP: (111-145)/(65-93)   Temp:  [36.2 ???C (97.2 ???F)-36.7 ???C (98 ???F)]   Pulse:  [80-108]   Respirations:  [16 PER MINUTE-20 PER MINUTE]   SpO2:  [94 %-99 %]   O2 Delivery: Nasal Cannula       Intake/Output Summary:  (Last 24 hours)    Intake/Output Summary (Last 24 hours) at 11/15/16 1250  Last data filed at 11/15/16 1100   Gross per 24 hour   Intake             1200 ml   Output             2350 ml   Net            -1150 ml       General: Awake and alert sitting in chair, in no acute distress.  HEENT: NC/AT, sclera non-icteric, moist mucus membranes.  Neck: no JVD. CV: regular rhythm, normal rate, no murmurs.  Lungs: clear to ausculation bilaterally. No wheezing, normal wob, on 4LNC  Abdomen: soft, non-tender, non-distended, normo-active bowel sounds  Extremities: no edema,cool and discolored BLE, pedal pulses 2 (+) bilaterally  Neuro: no focal deficits  Skin: no rashes    Lab Review  Recent CBC   Recent Labs      11/13/16   0523  11/15/16   0535   WBC  9.9  13.6*   HGB  14.2  13.6   HCT  42.4  39.8*   PLTCT  155  117*   MCV  105.2*  105.6*        Recent CMP   Recent Labs      11/13/16   0523  11/14/16   0534  11/15/16   0535   NA  134*  134*  133*   K  4.1  4.2  4.0   CL  96*  95*  95*   CO2  27  27  29    GAP  11  12  9    BUN  57*  57*  59*   CR  1.29*  1.25*  1.36*   GFR  54*  56*  51*   GLU  211*  199*  179*   CA  8.7  8.9  8.7   MG  2.3  2.2  2.2   PO4  2.8  3.1  3.5   ALBUMIN  3.6  3.7  3.4*   ALKPHOS  58  66  60   AST  22  24  16    ALT  29  34  25   TOTBILI  1.2  1.3*  1.2        Other labs   24-hour labs:    Results for orders placed or performed during the hospital encounter of 11/07/16 (from the past 24 hour(s))   POC GLUCOSE    Collection Time: 11/14/16  5:08 PM   Result Value Ref Range    Glucose, POC 145 (H) 70 - 100 MG/DL   POC GLUCOSE    Collection Time: 11/14/16  9:22 PM   Result Value Ref Range    Glucose, POC 357 (H) 70 - 100 MG/DL   POC GLUCOSE    Collection Time: 11/14/16 11:21 PM   Result Value Ref Range    Glucose, POC 184 (H) 70 - 100 MG/DL   COMPREHENSIVE METABOLIC PANEL    Collection Time: 11/15/16  5:35  AM   Result Value Ref Range    Sodium 133 (L) 137 - 147 MMOL/L    Potassium 4.0 3.5 - 5.1 MMOL/L    Chloride 95 (L) 98 - 110 MMOL/L    Glucose 179 (H) 70 - 100 MG/DL    Blood Urea Nitrogen 59 (H) 7 - 25 MG/DL    Creatinine 8.11 (H) 0.4 - 1.24 MG/DL    Calcium 8.7 8.5 - 91.4 MG/DL    Total Protein 5.7 (L) 6.0 - 8.0 G/DL    Total Bilirubin 1.2 0.3 - 1.2 MG/DL    Albumin 3.4 (L) 3.5 - 5.0 G/DL    Alk Phosphatase 60 25 - 110 U/L AST (SGOT) 16 7 - 40 U/L    CO2 29 21 - 30 MMOL/L    ALT (SGPT) 25 7 - 56 U/L    Anion Gap 9 3 - 12    eGFR Non African American 51 (L) >60 mL/min    eGFR African American >60 >60 mL/min   MAGNESIUM    Collection Time: 11/15/16  5:35 AM   Result Value Ref Range    Magnesium 2.2 1.6 - 2.6 mg/dL   PHOSPHORUS    Collection Time: 11/15/16  5:35 AM   Result Value Ref Range    Phosphorus 3.5 2.0 - 4.5 MG/DL   CBC AND DIFF    Collection Time: 11/15/16  5:35 AM   Result Value Ref Range    White Blood Cells 13.6 (H) 4.5 - 11.0 K/UL    RBC 3.77 (L) 4.4 - 5.5 M/UL    Hemoglobin 13.6 13.5 - 16.5 GM/DL    Hematocrit 78.2 (L) 40 - 50 %    MCV 105.6 (H) 80 - 100 FL    MCH 36.0 (H) 26 - 34 PG    MCHC 34.1 32.0 - 36.0 G/DL    RDW 95.6 (H) 11 - 15 %    Platelet Count 117 (L) 150 - 400 K/UL    MPV 9.2 7 - 11 FL    Neutrophils 90 (H) 41 - 77 %    Lymphocytes 4 (L) 24 - 44 %    Monocytes 6 4 - 12 %    Eosinophils 0 0 - 5 %    Basophils 0 0 - 2 %    Absolute Neutrophil Count 12.10 (H) 1.8 - 7.0 K/UL    Absolute Lymph Count 0.60 (L) 1.0 - 4.8 K/UL    Absolute Monocyte Count 0.90 (H) 0 - 0.80 K/UL    Absolute Eosinophil Count 0.00 0 - 0.45 K/UL    Absolute Basophil Count 0.00 0 - 0.20 K/UL   POC GLUCOSE    Collection Time: 11/15/16  7:31 AM   Result Value Ref Range    Glucose, POC 194 (H) 70 - 100 MG/DL   POC GLUCOSE    Collection Time: 11/15/16 12:17 PM   Result Value Ref Range    Glucose, POC 133 (H) 70 - 100 MG/DL        Point of Care Testing  (Last 24 hours)  Glucose: (!) 179 (11/15/16 0535)  POC Glucose (Download): (!) 133 (11/15/16 1217)    Radiology and other Diagnostics Review:    Pertinent imaging reviewed     Medications  Scheduled IV PRN     albuterol 0.5% (PROVENTIL; VENTOLIN) nebulizer solution 2.5 mg 2.5 mg Inhalation Q6H While Awake & PRN   allopurinol (ZYLOPRIM) tablet 300 mg 300 mg Oral QDAY   amitriptyline/gabapentin/emu oil(#) 05/24/08 %  topical cream 5 g 5 g Topical TID ascorbic acid (VITAMIN C) tablet 500 mg 500 mg Oral QDAY   aspirin tablet 325 mg 325 mg Oral QDAY   atorvastatin (LIPITOR) tablet 20 mg 20 mg Oral QDAY   budesonide/formoterol (SYMBICORT HFA) 160/4.5 mcg inhalation 2 puff 2 puff Inhalation BID   bumetanide (BUMEX) tablet 2 mg 2 mg Oral BID(9-17)   cetirizine (ZYRTEC) tablet 10 mg 10 mg Oral QAM8   cholecalciferol (VITAMIN D-3) tablet 1,000 Units 1,000 Units Oral QDAY   duloxetine DR (CYMBALTA) capsule 60 mg 60 mg Oral QDAY   enoxaparin (LOVENOX) syringe 40 mg 40 mg Subcutaneous BID   ferrous sulfate (FEOSOL, FEROSUL) tablet 325 mg 325 mg Oral QDAY   gabapentin (NEURONTIN) capsule 400 mg 400 mg Oral Q8H*   guaiFENesin LA (MUCINEX) tablet 600 mg 600 mg Oral BID   insulin aspart U-100 (NOVOLOG FLEXPEN) injection PEN 0-28 Units 0-28 Units Subcutaneous ACHS   ipratropium bromide (ATROVENT) 0.02 % nebulizer solution 0.5 mg 0.5 mg Inhalation Q6H While Awake & PRN   melatonin tablet 3 mg 3 mg Oral QHS   metoprolol XL (TOPROL XL) tablet 25 mg 25 mg Oral QDAY   montelukast (SINGULAIR) tablet 10 mg 10 mg Oral QHS   pantoprazole DR (PROTONIX) tablet 40 mg 40 mg Oral BID   polyethylene glycol 3350 (MIRALAX) packet 17 g 1 packet Oral QDAY   prednisone (DELTASONE) tablet 60 mg 60 mg Oral QDAY   senna/docusate (SENOKOT-S) tablet 1 tablet 1 tablet Oral BID   spironolactone (ALDACTONE) tablet 25 mg 25 mg Oral BID(9-17)     acetaminophen Q6H PRN, benzonatate TID PRN, codeine/guaiFENesin Q4H PRN, ondansetron Q6H PRN

## 2016-11-15 NOTE — Progress Notes
PHYSICAL THERAPY  NOTE   Patient's case reviewed and discussed during interdisciplinary rounds.    Therapist: Norva Karvonen, Physical therapist assistant  Date: 11/15/2016

## 2016-11-15 NOTE — Consults
INPATIENT DIABETES EDUCATION TEAM ???[facility]    Reason for Consult: new insulin, New insulin/blood sugar checks for high dose steroids. Planned DC 9/27 by 1100. Please call nurse prior to arrival, to ensure pt's wife is present. Thank you!    Discussed Consult with Primary Team: Dr. Marta Antu; Clent Demark, NCM    Patient may benefit from: on-going practice giving self insulin injections; Humalog voucher; Glucocard Expression Kit and supply prescriptions; A1c recheck    This consult team does not write orders. Primary Team is responsible for placing orders.    Supplies/Resources provided:   ??? Glucocard Expression Kit  ??? New insulin start packet    Met with [name]. to discuss diabetes management.  Wife, [name], at bedside. Discussed diagnosis of hyperglycemia secondary to medication management.   ??? Pt was able to perform a FSBS  using Glucocard Expression Kit glucose meter provided with minimal verbal prompts; result 334.[name]   ??? Discussed goals and ranges of treatment and types of insulin used: Humalog (including action times, storage, handling and administration using pens and pen needles). Will provide pt with voucher for Humalog that will provide pt with 1 box (5 total pens) of Humalog for free. Discussed with NCM.   ??? Patient and family given opportunity for additional questions.  ??? No decision with regards to d/c doses have been made at this time. Will provide schedule tomorrow prior to d/c.  ??? Patient was able to discuss hypoglycemia and correct treatment and repeat treatment back to this educator. Pt and wife report patient has experienced hypoglycemia before, and doctors could not figure out why. Patient's wife reports he would have spells at home, she would have to check blood sugar at that point, and they would plummet. Patient's wife would give him orange juice and a candy bar and symptoms would subside. [name] reports he was going to need to be hospitalized at one point for a 3 day fast to monitor hypoglycemia, but insurance wouldn't cover it, so patient has never had it completed. Discussed importance of checking blood sugars, s/s of hypoglycemia, and rule of 15.     Provided education to patient regarding new insulin regimen:  Discussed the frequency of monitoring blood sugar and recommended goals of 70-140 mg/dL, before meals.  Encouraged patient to record results in log book and to take meter and log book to provider visits.  Reviewed preferred injection sites and importance of rotation.    Provided teaching with insulin pen and pen needle:  Instructed patient to use current, non-expired insulin; discussed dialing up 2 units to prime nano insulin pen needle; dialing correct number of units of insulin;  inserting needle straight into injection site; injecting insulin and waiting for 10 seconds, proper disposal of sharps, insulin action time and insulin handling and storage.  Pt and wife both able to return demonstrate understanding. Patient needed some verbal cues, especially to remember to prime pen needle and hold for 10 seconds after administering.     Hypoglycemia: Reviewed signs, symptoms and treatment (Rule of 15)    Hyperglycemia: Reviewed possible causes, prevention and treatment.    Please provide patient with the following prescriptions at discharge:  ENTER THE ORDER NUMBER BELOW IN RED.  Do not select by the blue product names.  Include on each script the type of diabetes, uncontrolled or controlled, requiring insulin or oral hypoglycemics.    Inexpensive test strips Glucocard Expression test strips; Qty 50  [id number]   Test as  directed. Dispense number needed/mo.    Monolet Lancets (generic) use with FreeStyle & Glucocard (Lancets Misc) # 100 per box. 4345    PENS AND NEEDLES:    Humalog Kwik Pen 300 units/ 3 ml, #5 pens/box. Dispense by total volume of units needed/mo. [id number] BD Ultra Fine Nano Insulin pen needles 32 gauge X 5/32  (BD Ultra-Fine Nano Pen Needles 32 x 5/32??? misc ndle), # 100/ box. Dispense by total # needed/ month. [id number]    Appreciate the consult. Please contact diabetes educators with any additional questions or concerns.     Perley Jain, BSN, BS, RN, CPT  Diabetes Educator  Office: [phone]  Pager: [phone]  Diabetes Education Team Pager: [phone]  Diabetes Team Office: [phone]

## 2016-11-15 NOTE — Progress Notes
Heart Failure Progress Note    NAME:Ruben Wood.                                                                   MRN: [id number]                 DOB:1940-06-17          AGE: 76 y.o.  ADMISSION DATE: 11/07/2016             DAYS ADMITTED: LOS: 8 days     Assessment:  Active Problems:    OSA on CPAP    GERD (gastroesophageal reflux disease)    CAD (coronary artery disease)    AAA (abdominal aortic aneurysm) (HCC)    CKD (chronic kidney disease) stage 3, GFR 30-59 ml/min (HCC)    Chronic combined systolic and diastolic congestive heart failure, NYHA class 2 (HCC)    Chronic obstructive pulmonary disease (HCC)    Ischemic cardiomyopathy    Moderate persistent asthma with acute exacerbation    Iron deficiency    Acute respiratory failure with hypoxia (HCC)    Severe sepsis (HCC)    Lactic acid acidosis    Hyperglycemia    Rhinovirus infection     76 y/o M???w/ PMH depression, chronic pain, asthma, CKD, HTN, HLD, CAD, ischemic cardiomyopathy s/p pacer, ICD &???cardioMEMS implant admitted for acute hypoxic respiratory failure and cardiology consulted for Acute HF exacerbation    Acute on chronic systolic and diastolic HFrEF,  EF: 50% s/p CardioMEMS  Major Complications or Comorbidities [facility]): acute respiratory failure  NYHA functional class III (marked limitation of physical activity - comfortable at rest, but less than ordinary activity causes symptoms of HF e.g., getting dressed or standing from a sitting position),   ACC Stage C (structural heart disease with prior or current symptoms of HF). due to ICM s/p ICD implant   He presents with signs of hypovolemia with Bi  ventricular failure without signs of low flow state.  Admission BNP: 92  Prior to admission diuretic regimen: Bumetanide 2 mg po BID and Spironolactone 25 mg po bid  Goal Output: Keep pt euvolemic  Intake/Output:  Entire stay net: -[phone], Last 24 hr net: -2725  PAD goal of 5-10; today he is 8  Admission Weight: 136.1 kg (300 lb) Most recent weights (inpatient):   Vitals:    11/12/16 1451 11/14/16 0536 11/15/16 0600   Weight: (!) 138.8 kg (306 lb) (!) 137.3 kg (302 lb 12.8 oz) 136.1 kg (300 lb)        Coronary artery disease  LHC [facility] at [facility]-severe OMB/Circ and distal circ disease CTO of L circ with collaterals   LHC 2014 CTO of RCA, CTO OM and high grade 90% mCirc. Failed attempt at PCI of OM  10/14/12: Unsuccessful attempt to open CTO of OMB and mid-CFX by Dr. Mackey Birchwood   Continue ASA, BB, STATIN  Denies anginal symptoms   ???  Non Sustained VTach  K 4.0, Mg 2.2  nml device fxn but max tracking rate was set to 100bpm and pt was having sinus rates up to 125bpm, so he was not receiving BiV pacing beyond 100 bpm, we will order device re-programming which will adjust max track rate to  appropriately BiV pace pt's sinus tachycardia  Pt asx    Chronic Kidney Disease stage III  BL Cr. ~ 1.4  Cr. Up to 2.07 this admit now back to baseline  fluctuant kidney function as well as orthostatic symptoms in the past.   ???  Hypertension  Continue Bumetanide, BB and Spiro as above.    ???  Dyslipidemia-LDL 76 02/25/16.   Continue Atorvastatin 20 mg daily PTA  ???  Chronic Obstructive Pulmonary Disease/Asthma/acute exacerbation of chronic respiratory failure/Sepsis  Blood cultures negative to date  Viral panel was positive for rhinovirus  Sputum cultures grew gram +ve cocci (strep)  treated with multiple inhalers, antibiotics and Methylprednisolone IV. Dose to be adjusted down to 62.5 mg q 8 hours 11/10/16  02 requirements down to 3L.   On Augmentin until 9/26  ???  Abdominal Aortic Aneurysm   Duplex done at OSH in 2007 and 2008 ~ 4.1 cm  Duplex 10/15/12-AAA 7.3 cm AP x 7.3 cm  2014 Endovascular repair of large abdominal aortic aneurysm utilizing Gore endovascular device  ???  Obstructive Sleep Apnea treated with CPAP  ???  Morbid Obesity-Body mass index is 41.84 kg/m???. Weight loss, Cardiac Healthy diet, and exercise when patient can tolerate. Dietitian Consult.     Plan:  Guideline Based Heart Failure Therapies:  HF approved beta blockers: Yes (Metoprolol CR/XL) 25 mg po daily   ACE/ARB/Angiotensin II Receptor Blocker Neprilysin Inhibitor:  No  neither Hypotension nor Renal dysfunction  Aldosterone antagonist: Yes Spironolactone 25 mg po bid  Ivabradine: No; Not treated with maximally tolerated dose beta blockers or beta blockers contraindicated  Diuretic Regimen: Continue Bumex PTA dose of 2mg  BID; Spironolactone 25mg  mg BID as PA diastolic on CardioMEMS within goal of 5-10; today it was 5.  BMP once a day. Magnesium level daily.  Keep Potassium greater than 4.0 and Magnesium greater than 2.0.  2000mg  sodium dietary restriction.  Liberalize fluid restriction to 2L  Strict I/O; keep pt euvolemic  Standing scale daily weight  Follow Up: appointment with a member of the HF team or PCP within 7 calendar days of discharge.   Will f/u with device re-programming which will adjust max track rate of CRT to appropriately BiV pace pt's sinus tachycardia    Discussed with Dr.Genton      MD      _____________________________________________________________________________    Subjective:     Pt reports feeling better with improved dyspnea, coughing and leg swelling.  He denies chest pain, dizziness, palpitations, fever or chills.  No other acute concerns or complaints today.    Objective:    albuterol 0.5% (PROVENTIL; VENTOLIN) nebulizer solution 2.5 mg 2.5 mg Inhalation Q6H While Awake & PRN   allopurinol (ZYLOPRIM) tablet 300 mg 300 mg Oral QDAY   amitriptyline/gabapentin/emu oil(#) 05/24/08 % topical cream 5 g 5 g Topical TID   ascorbic acid (VITAMIN C) tablet 500 mg 500 mg Oral QDAY   aspirin tablet 325 mg 325 mg Oral QDAY   atorvastatin (LIPITOR) tablet 20 mg 20 mg Oral QDAY   budesonide/formoterol (SYMBICORT HFA) 160/4.5 mcg inhalation 2 puff 2 puff Inhalation BID bumetanide (BUMEX) tablet 2 mg 2 mg Oral BID(9-17)   cetirizine (ZYRTEC) tablet 10 mg 10 mg Oral QAM8   cholecalciferol (VITAMIN D-3) tablet 1,000 Units 1,000 Units Oral QDAY   duloxetine DR (CYMBALTA) capsule 60 mg 60 mg Oral QDAY   enoxaparin (LOVENOX) syringe 40 mg 40 mg Subcutaneous BID   ferrous sulfate (FEOSOL, FEROSUL) tablet 325 mg  325 mg Oral QDAY   gabapentin (NEURONTIN) capsule 400 mg 400 mg Oral Q8H*   guaiFENesin LA (MUCINEX) tablet 600 mg 600 mg Oral BID   insulin aspart U-100 (NOVOLOG FLEXPEN) injection PEN 0-28 Units 0-28 Units Subcutaneous ACHS   ipratropium bromide (ATROVENT) 0.02 % nebulizer solution 0.5 mg 0.5 mg Inhalation Q6H While Awake & PRN   melatonin tablet 3 mg 3 mg Oral QHS   metoprolol XL (TOPROL XL) tablet 25 mg 25 mg Oral QDAY   montelukast (SINGULAIR) tablet 10 mg 10 mg Oral QHS   pantoprazole DR (PROTONIX) tablet 40 mg 40 mg Oral BID   polyethylene glycol 3350 (MIRALAX) packet 17 g 1 packet Oral QDAY   prednisone (DELTASONE) tablet 60 mg 60 mg Oral QDAY   senna/docusate (SENOKOT-S) tablet 1 tablet 1 tablet Oral BID   spironolactone (ALDACTONE) tablet 25 mg 25 mg Oral BID(9-17)     acetaminophen Q6H PRN, benzonatate TID PRN, codeine/guaiFENesin Q4H PRN, ondansetron Q6H PRN                       Vital Signs:  Last Filed                Vital Signs: 24 Hour Range   BP: 150/74 (09/26 1556)  Temp: 36.9 ???C (98.4 ???F) (09/26 1556)  Pulse: 102 (09/26 1556)  Respirations: 18 PER MINUTE (09/26 1556)  SpO2: 93 % (09/26 1556)  O2 Delivery: Nasal Cannula (09/26 1556)  BP: (111-150)/(65-88)   Temp:  [36.2 ???C (97.2 ???F)-36.9 ???C (98.4 ???F)]   Pulse:  [80-108]   Respirations:  [16 PER MINUTE-20 PER MINUTE]   SpO2:  [93 %-99 %]   O2 Delivery: Nasal Cannula    Intensity Pain Scale (Self Report): Asleep  Verbal Pain Description: Moderate Pain      Physical Exam:   General Appearance: well developed, well nourished, cough  Chest Inspection: chest is normal in appearance Respiratory Effort: no acute distress   Auscultation/Percussion: lungs with wheezes   Cardiac Rhythm: regular rhythm and normal rate  Cardiac Auscultation: S1, S2 normal, no rub, no gallop   Murmurs: no murmur   Radial Arteries: normal symmetric radial pulses   Lower Extremity: 1+ left lower extremity edema, discolored and cool with compression stockings in place bilaterally, 1 approximately 1 0.5 cm stage 2 ulcer noted on left anterior lower extremity, likely 2/2 chronic venous stasis   Abdominal Exam: soft, non-tender, non-distended, normal bowel sounds   Neurologic Exam: neurological assessment grossly intact   Other: moves all extremities  ???  Laboratory Review:   24-hour labs:    Results for orders placed or performed during the hospital encounter of 11/07/16 (from the past 24 hour(s))   POC GLUCOSE    Collection Time: 11/14/16  9:22 PM   Result Value Ref Range    Glucose, POC 357 (H) 70 - 100 MG/DL   POC GLUCOSE    Collection Time: 11/14/16 11:21 PM   Result Value Ref Range    Glucose, POC 184 (H) 70 - 100 MG/DL   COMPREHENSIVE METABOLIC PANEL    Collection Time: 11/15/16  5:35 AM   Result Value Ref Range    Sodium 133 (L) 137 - 147 MMOL/L    Potassium 4.0 3.5 - 5.1 MMOL/L    Chloride 95 (L) 98 - 110 MMOL/L    Glucose 179 (H) 70 - 100 MG/DL    Blood Urea Nitrogen 59 (H) 7 - 25 MG/DL  Creatinine 1.36 (H) 0.4 - 1.24 MG/DL    Calcium 8.7 8.5 - 14.7 MG/DL    Total Protein 5.7 (L) 6.0 - 8.0 G/DL    Total Bilirubin 1.2 0.3 - 1.2 MG/DL    Albumin 3.4 (L) 3.5 - 5.0 G/DL    Alk Phosphatase 60 25 - 110 U/L    AST (SGOT) 16 7 - 40 U/L    CO2 29 21 - 30 MMOL/L    ALT (SGPT) 25 7 - 56 U/L    Anion Gap 9 3 - 12    eGFR Non African American 51 (L) >60 mL/min    eGFR African American >60 >60 mL/min   MAGNESIUM    Collection Time: 11/15/16  5:35 AM   Result Value Ref Range    Magnesium 2.2 1.6 - 2.6 mg/dL   PHOSPHORUS    Collection Time: 11/15/16  5:35 AM   Result Value Ref Range    Phosphorus 3.5 2.0 - 4.5 MG/DL CBC AND DIFF    Collection Time: 11/15/16  5:35 AM   Result Value Ref Range    White Blood Cells 13.6 (H) 4.5 - 11.0 K/UL    RBC 3.77 (L) 4.4 - 5.5 M/UL    Hemoglobin 13.6 13.5 - 16.5 GM/DL    Hematocrit 82.9 (L) 40 - 50 %    MCV 105.6 (H) 80 - 100 FL    MCH 36.0 (H) 26 - 34 PG    MCHC 34.1 32.0 - 36.0 G/DL    RDW 56.2 (H) 11 - 15 %    Platelet Count 117 (L) 150 - 400 K/UL    MPV 9.2 7 - 11 FL    Neutrophils 90 (H) 41 - 77 %    Lymphocytes 4 (L) 24 - 44 %    Monocytes 6 4 - 12 %    Eosinophils 0 0 - 5 %    Basophils 0 0 - 2 %    Absolute Neutrophil Count 12.10 (H) 1.8 - 7.0 K/UL    Absolute Lymph Count 0.60 (L) 1.0 - 4.8 K/UL    Absolute Monocyte Count 0.90 (H) 0 - 0.80 K/UL    Absolute Eosinophil Count 0.00 0 - 0.45 K/UL    Absolute Basophil Count 0.00 0 - 0.20 K/UL   POC GLUCOSE    Collection Time: 11/15/16  7:31 AM   Result Value Ref Range    Glucose, POC 194 (H) 70 - 100 MG/DL   POC GLUCOSE    Collection Time: 11/15/16 12:17 PM   Result Value Ref Range    Glucose, POC 133 (H) 70 - 100 MG/DL   POC GLUCOSE    Collection Time: 11/15/16  5:11 PM   Result Value Ref Range    Glucose, POC >600 (HH) 70 - 100 MG/DL   POC GLUCOSE    Collection Time: 11/15/16  5:12 PM   Result Value Ref Range    Glucose, POC 248 (H) 70 - 100 MG/DL       Point of Care Testing:  (Last 24 hours):  Glucose: (!) 179 (11/15/16 0535)  POC Glucose (Download): (!) 248 (11/15/16 1712)    Tele/ECG: NSVT, PVCs    Echocardiogram Details:  Echo Results  (Last 3 results in the past 3 years)    Echo EF LVIDD LA Size IVS LVPW Rest PAP    (11/08/16)  50 (11/08/16)  6.31 (11/08/16)  4.24 (11/08/16)  1.47 (11/08/16)  1.23 (11/07/16)  19    (05/17/16)  40 (05/17/16)  5.49 (11/07/16)  3.88 (05/17/16)  1.1 (05/17/16)  0.75 (05/17/16)  32    (10/27/15)  30 (10/27/15)  6.0 (05/17/16)  4.71 (10/27/15)  1.3 (10/27/15)  1.0 (10/27/15)  NA           Chest X-Ray:  11/07/2016: Development of right basilar consolidation, which may reflect pneumonia, atelectasis, and/or aspiration.

## 2016-11-16 ENCOUNTER — Inpatient Hospital Stay: Admit: 2016-11-11 | Discharge: 2016-11-11 | Payer: MEDICARE

## 2016-11-16 ENCOUNTER — Inpatient Hospital Stay: Admit: 2016-11-08 | Discharge: 2016-11-08 | Payer: MEDICARE

## 2016-11-16 ENCOUNTER — Encounter: Admit: 2016-11-16 | Discharge: 2016-11-16 | Payer: MEDICARE

## 2016-11-16 ENCOUNTER — Inpatient Hospital Stay: Admit: 2016-11-07 | Discharge: 2016-11-07 | Payer: MEDICARE

## 2016-11-16 ENCOUNTER — Inpatient Hospital Stay: Admit: 2016-11-10 | Discharge: 2016-11-10 | Payer: MEDICARE

## 2016-11-16 ENCOUNTER — Inpatient Hospital Stay: Admit: 2016-11-13 | Discharge: 2016-11-13 | Payer: MEDICARE

## 2016-11-16 ENCOUNTER — Inpatient Hospital Stay
Admit: 2016-11-07 | Discharge: 2016-11-16 | Disposition: A | Discharge status: Home / Self Care | Payer: MEDICARE | Source: Other Acute Inpatient Hospital | Admitting: Pulmonary Disease

## 2016-11-16 ENCOUNTER — Inpatient Hospital Stay: Admit: 2016-11-14 | Discharge: 2016-11-14 | Payer: MEDICARE

## 2016-11-16 DIAGNOSIS — Q211 Atrial septal defect: ICD-10-CM

## 2016-11-16 DIAGNOSIS — Z7982 Long term (current) use of aspirin: ICD-10-CM

## 2016-11-16 DIAGNOSIS — I5042 Chronic combined systolic (congestive) and diastolic (congestive) heart failure: ICD-10-CM

## 2016-11-16 DIAGNOSIS — Z9049 Acquired absence of other specified parts of digestive tract: ICD-10-CM

## 2016-11-16 DIAGNOSIS — Z87891 Personal history of nicotine dependence: ICD-10-CM

## 2016-11-16 DIAGNOSIS — J310 Chronic rhinitis: ICD-10-CM

## 2016-11-16 DIAGNOSIS — Z23 Encounter for immunization: ICD-10-CM

## 2016-11-16 DIAGNOSIS — J4541 Moderate persistent asthma with (acute) exacerbation: ICD-10-CM

## 2016-11-16 DIAGNOSIS — M545 Low back pain: ICD-10-CM

## 2016-11-16 DIAGNOSIS — I872 Venous insufficiency (chronic) (peripheral): ICD-10-CM

## 2016-11-16 DIAGNOSIS — R6520 Severe sepsis without septic shock: ICD-10-CM

## 2016-11-16 DIAGNOSIS — G8929 Other chronic pain: ICD-10-CM

## 2016-11-16 DIAGNOSIS — Z6841 Body Mass Index (BMI) 40.0 and over, adult: ICD-10-CM

## 2016-11-16 DIAGNOSIS — I130 Hypertensive heart and chronic kidney disease with heart failure and stage 1 through stage 4 chronic kidney disease, or unspecified chronic kidney disease: ICD-10-CM

## 2016-11-16 DIAGNOSIS — D509 Iron deficiency anemia, unspecified: ICD-10-CM

## 2016-11-16 DIAGNOSIS — J9621 Acute and chronic respiratory failure with hypoxia: ICD-10-CM

## 2016-11-16 DIAGNOSIS — N183 Chronic kidney disease, stage 3 (moderate): Secondary | ICD-10-CM

## 2016-11-16 DIAGNOSIS — Z9981 Dependence on supplemental oxygen: ICD-10-CM

## 2016-11-16 DIAGNOSIS — Z95810 Presence of automatic (implantable) cardiac defibrillator: ICD-10-CM

## 2016-11-16 DIAGNOSIS — J45901 Unspecified asthma with (acute) exacerbation: ICD-10-CM

## 2016-11-16 DIAGNOSIS — M549 Dorsalgia, unspecified: ICD-10-CM

## 2016-11-16 DIAGNOSIS — J449 Chronic obstructive pulmonary disease, unspecified: ICD-10-CM

## 2016-11-16 DIAGNOSIS — Z8679 Personal history of other diseases of the circulatory system: ICD-10-CM

## 2016-11-16 DIAGNOSIS — E782 Mixed hyperlipidemia: ICD-10-CM

## 2016-11-16 DIAGNOSIS — I739 Peripheral vascular disease, unspecified: ICD-10-CM

## 2016-11-16 DIAGNOSIS — G4733 Obstructive sleep apnea (adult) (pediatric): ICD-10-CM

## 2016-11-16 DIAGNOSIS — I255 Ischemic cardiomyopathy: ICD-10-CM

## 2016-11-16 DIAGNOSIS — I2510 Atherosclerotic heart disease of native coronary artery without angina pectoris: ICD-10-CM

## 2016-11-16 DIAGNOSIS — R739 Hyperglycemia, unspecified: ICD-10-CM

## 2016-11-16 DIAGNOSIS — A419 Sepsis, unspecified organism: Principal | ICD-10-CM

## 2016-11-16 DIAGNOSIS — B9789 Other viral agents as the cause of diseases classified elsewhere: ICD-10-CM

## 2016-11-16 DIAGNOSIS — K219 Gastro-esophageal reflux disease without esophagitis: ICD-10-CM

## 2016-11-16 DIAGNOSIS — F329 Major depressive disorder, single episode, unspecified: ICD-10-CM

## 2016-11-16 DIAGNOSIS — I878 Other specified disorders of veins: ICD-10-CM

## 2016-11-16 DIAGNOSIS — A4902 Methicillin resistant Staphylococcus aureus infection, unspecified site: ICD-10-CM

## 2016-11-16 DIAGNOSIS — E872 Acidosis: ICD-10-CM

## 2016-11-16 LAB — POC GLUCOSE
Lab: 202 mg/dL — ABNORMAL HIGH (ref 70–100)
Lab: 141 mg/dL — ABNORMAL HIGH (ref 70–100)
Lab: 159 mg/dL — ABNORMAL HIGH (ref 70–100)

## 2016-11-16 LAB — MAGNESIUM: Lab: 2.2 mg/dL — ABNORMAL LOW (ref 1.6–2.6)

## 2016-11-16 LAB — BASIC METABOLIC PANEL: Lab: 136 MMOL/L — ABNORMAL LOW (ref >60)

## 2016-11-16 MED ORDER — INSULIN LISPRO 100 UNIT/ML SC INPN
0 refills | Status: CN
Start: 2016-11-16 — End: ?

## 2016-11-16 MED ORDER — PEN NEEDLE, DIABETIC 32 GAUGE X 5/32" MISC NDLE
1 | 0 refills | 30.00000 days | Status: AC | PRN
Start: 2016-11-16 — End: 2017-02-06

## 2016-11-16 MED ORDER — INSULIN LISPRO 100 UNIT/ML SC INPN
0 refills | 30.00000 days | Status: AC
Start: 2016-11-16 — End: 2017-02-06
  Filled 2016-11-16 (×2): qty 15, 60d supply, fill #1

## 2016-11-16 MED ORDER — BLOOD SUGAR DIAGNOSTIC MISC STRP
1 | ORAL_STRIP | Freq: Before meals | 0 refills | 30.00000 days | Status: AC
Start: 2016-11-16 — End: 2017-02-06
  Filled 2016-11-16 (×2): qty 100, 30d supply, fill #1
  Filled 2016-11-16 (×2): qty 100, 25d supply, fill #1

## 2016-11-16 MED ORDER — PREDNISONE 10 MG PO TAB
ORAL_TABLET | Freq: Every day | 0 refills | Status: AC
Start: 2016-11-16 — End: 2017-01-03

## 2016-11-16 MED ORDER — ACETAMINOPHEN 500 MG PO TAB
500 mg | ORAL | 0 refills | Status: SS | PRN
Start: 2016-11-16 — End: 2017-03-29

## 2016-11-16 MED ORDER — PREDNISONE 10 MG PO TAB
ORAL_TABLET | Freq: Every day | 0 refills | Status: AC
Start: 2016-11-16 — End: 2016-11-16

## 2016-11-16 MED ORDER — INSULIN ASPART 100 UNIT/ML SC FLEXPEN
SUBCUTANEOUS | 0 refills | 42.00000 days | Status: DC
Start: 2016-11-16 — End: 2017-02-06

## 2016-11-16 MED ORDER — INSULIN LISPRO 100 UNIT/ML SC INPN
0 refills | 30.00000 days | Status: DC
Start: 2016-11-16 — End: 2017-02-06

## 2016-11-16 MED ORDER — LANCETS MISC MISC
1 | Freq: Before meals | 0 refills | Status: AC
Start: 2016-11-16 — End: 2017-02-06
  Filled 2016-11-16 (×2): qty 100, 25d supply, fill #1

## 2016-11-16 MED ORDER — BLOOD-GLUCOSE METER MISC MISC
PACK | 0 refills | Status: DC
Start: 2016-11-16 — End: 2017-02-06
  Filled 2016-11-16 (×2): qty 1, 30d supply, fill #1

## 2016-11-16 NOTE — Progress Notes
Daily CardioMEMS Readings      Goal PA Diastolic: 5-10 mmHg      Taken on PA Systolic PA Diastolic PA Mean Heart Rate    11-16-2016, 09:03 AM 29 mmHg 13 mmHg 18 mmHg 103 bpm    11-15-2016, 11:35 AM 14 mmHg 5 mmHg 8 mmHg 93 bpm    11-14-2016, 09:30 AM 20 mmHg 8 mmHg 12 mmHg 103 bpm    11-13-2016, 10:05 AM 22 mmHg 9 mmHg 14 mmHg 101 bpm    11-10-2016, 09:33 AM 31 mmHg 13 mmHg 19 mmHg 87 bpm    11-09-2016, 11:20 AM 22 mmHg 7 mmHg 11 mmHg 74 bpm    11-08-2016, 10:40 AM 36 mmHg 16 mmHg 23 mmHg 88 bpm    11-06-2016, 06:55 PM 42 mmHg 20 mmHg 28 mmHg 101 bpm    11-01-2016, 11:20 AM 21 mmHg 7 mmHg 12 mmHg 105 bpm    10-31-2016, 10:48 AM 23 mmHg 7 mmHg 13 mmHg 97 bpm      Matthew Folks, RN, Engineer, civil (consulting)  Phone: [phone]  Pager: [phone]

## 2016-11-16 NOTE — Consults
Met with patient and wife. Discussed insulin schedule prior to discharge. Practiced using scale with wife. No difficulty using scale. Pt and wife will with local provider or PCP with questions.     Provided patient with Humalog voucher. Will provide patient with 5 free pens at local pharmacy.    Daily Schedule   Taking insulin and monitoring your blood sugar     Morning:  Take your blood sugar & write down the results  Take mealtime insulin of Humalog PER scale below.  Eat breakfast within 15 minutes    Lunch:  Take your blood sugar & write down the results  Take mealtime insulin of Humalog PER scale below.  Eat lunch within 15 minutes    Supper/Dinner:  Take your blood sugar & write down the results  Take mealtime insulin of Humalog PER scale below.  Eat supper within 15 minutes      If  your pre meal Blood sugar is: Give this # of units of Humalog   70 to 140                          0    140 to 180  2    181 to 220 4    221 to 260 6    261 to 300 8    301 to 350 10    351 to 400 12    400 or higher 14      Bedtime:   Take your blood sugar & write down the results    Call your doctor if blood sugar is below 70 after 2 treatments or  above 250 twice in 1 day    Dorothyann Peng, RN, BSN  Clinical Nurse Excellence - Diabetes Educator  (P) [phone]  Pager - [phone]

## 2016-11-16 NOTE — Progress Notes
[name]. discharged on 11/16/2016.   [name]  Discharge instructions reviewed with patient and family.  Valuables returned:   Personal Items / Valuables: Valuables/Belongings home with patient.  Home medications:    .  Functional assessment at discharge complete: Yes .            IV removed. Reviewed discharge instructions, all questions answered at this time. Pt left via wheelchair with spouse as ride home.

## 2016-11-16 NOTE — Patient Education
Medication Education    Ruben Wood accepted counseling and was interactive.  Ruben Wood verbalized understanding.    The following medications were discussed:  Amitriptyline/gabapentin/emu oil cream  lovenox  Gabapentin  mucinex  novolog  Melatonin  singulair  protonix  Senna      Where indicated, the patient was provided with additional medication and/or disease-state information.  All patient questions were answered and patient acknowledged understanding of the medications, side effects and other pertinent medication information.    Patient educated on plan of care, discharge planning, I&O, insulin administration, and pain management    Follow up should occur as needed.    Continue to address: indications    Ruben Wood

## 2016-11-16 NOTE — Progress Notes
Per Dr. Marta Antu pt ok to discharge home as he is cleared from cardiology stand point.

## 2016-11-16 NOTE — Case Management (ED)
Case Management Progress Note    NAME:Ruben Wood.                          MRN: [id number]              DOB:August 17, 1940          AGE: 76 y.o.  ADMISSION DATE: 11/07/2016             DAYS ADMITTED: LOS: 9 days      Todays Date: 11/16/2016    Plan    *Pt is medically stable and ready for DC home with family assist.        Interventions  ? Support   Support: Pt/Family Updates re:POC or DC Plan     *Pt and wife stating a RW is not necessary at DC as pt owns several at home. He purchased them OOP and doesn't need a "collection of them."  *Pt and wife do not feel home health will be necessary as they "manage well on their own."        ? Info or Referral      ? Discharge Planning   Discharge Planning: No Needs Identified     *NCM updated that by rounding RN, A.Estes that pt passed the ExOx on RA and only required PRN O2 at night. Pt will not qualify for home oxygen at DC with only PRN needs.  *No NCM needs identified at this time.  *NCM to continue to follow with medical team through DC.          ? Medication Needs      ? Financial      ? Legal      ? Other        Disposition  ? Expected Discharge Date    Expected Discharge Date: 11/16/16  Expected Discharge Time: 1200  ? Transportation   Does the patient need discharge transport arranged?: No  Transportation Name, Phone and Availability #1: [name] - wife [phone]  Does the patient use Medicaid Transportation?: No  ? Next Level of Care (Acute Psych discharges only)      ? Discharge Disposition      Durable Medical Equipment     No service has been selected for the patient.      [facility]     No service has been selected for the patient.      [facility]     No service has been selected for the patient.      [facility]     No service has been selected for the patient.          Clent Demark, RN, BSN  Nurse Case Manager  Hematology / Surgical Non-Transplant  Phone: [phone]  Pager:  [phone]  Fax:  [phone]

## 2016-11-16 NOTE — Progress Notes
Cardiology Progress Note      Admission Date: 11/07/2016  Today's Date: 11/16/2016  LOS: 9 days        Assessment & Plan   Ruben Wood. is a 76 y.o. patient with the following problems:    Active Problems:    OSA on CPAP    GERD (gastroesophageal reflux disease)    CAD (coronary artery disease)    AAA (abdominal aortic aneurysm) (HCC)    CKD (chronic kidney disease) stage 3, GFR 30-59 ml/min (HCC)    Chronic combined systolic and diastolic congestive heart failure, NYHA class 2 (HCC)    Chronic obstructive pulmonary disease (HCC)    Ischemic cardiomyopathy    Moderate persistent asthma with acute exacerbation    Iron deficiency    Acute respiratory failure with hypoxia (HCC)    Severe sepsis (HCC)    Lactic acid acidosis    Hyperglycemia    Rhinovirus infection      Acute on chronic systolic and diastolic HFrEF,  EF: 50% NYHA Class III, ACC stage C due to ICM s/p ICD implant as well as CardioMEMS  Major Complications or Comorbidities [facility]): acute respiratory failure  He presents with signs of hypervolemia with bi  ventricular failure without signs of low flow state.  CardioMEMS in place since 03/30/15: Normal cardiac output and cardiac index. Normal pulmonary pressures and normal pulmonary capillary wedge pressure.   CardioMEMS readings   11-16-2016, 09:03 AM 29 mmHg 13 mmHg 18 mmHg 103 bpm ???   11-15-2016, 11:35 AM 14 mmHg 5 mmHg 8 mmHg 93 bpm ???   11-14-2016, 09:30 AM 20 mmHg 8 mmHg 12 mmHg 103 bpm    Echo 11/04/14: LVEF 20%  Echo 11/08/16: Low normal left ventricular systolic function with an EF of 50%. No evidence of a Patent Foramen Ovale (PFO) or intracardiac shunt as assessed by both color doppler and contrast saline injection (with and without valsalva).Normal right ventricular size and function. Valves poorly visualized; no obvious valvular disease identified on this 2D only study. No pericardial effusion. In comparison to prior study dated 11-07-2016: No significant interval change. ICD interrogation 05/22/16: 2 Pt initiated remote for unknown reason on 07/21/16 and 07/29/16. Lead trends stable. Presenting strip ASBiVP/APBiVP. ???5 short ???AMS events. ECGs for these show FFRWOS. BiVP is 98 %. ???[place]vue with possible fluid noted end of May and first part of June. Improved in last few days.   ???Admission BNP: 92 11/07/16    Intake/Output Summary (Last 24 hours) at 11/16/16 1445  Last data filed at 11/16/16 1143   Gross per 24 hour   Intake              660 ml   Output             4200 ml   Net            -3540 ml     Goal Dry Weight: 290#   Vitals:    11/14/16 0536 11/15/16 0600 11/16/16 0558   Weight: (!) 137.3 kg (302 lb 12.8 oz) 136.1 kg (300 lb) (!) 136.8 kg (301 lb 9.6 oz)         Diuretic Therapy ???   Prior to admission dose Bumetanide 2 mg po BID and Spironolactone 25 mg po bid   Given on admission Furosemide 40 mg IV daily 9/18-9/20 then Bumetanide 3 mg po bid 11/10/16        Daily Dosing Bumetanide 2 mg po bid since 11/14/16     ???  GDMT PTA Changes   BB Yes (Metoprolol CR/XL) 25 mg po daily  ???   ACEI/ARB/ARNI  No  neither Hypotension nor Renal dysfunction ???   Aldosterone Antagonist  Yes Spironolactone 25 mg po bid ???   Hydralazine/Nitrate  No (N/A - patient not Black/African American) ???   Ivabradine No; Not treated with maximally tolerated dose beta blockers or beta blockers contraindicated ???   HRMT  Yes (CRT-D) St. Jude since 11/25/15  ???   Anticoagulation for Afib/flutter  N/A ???   ???  Coronary artery disease  LHC 2004 at [facility] heart hospital-severe OMB/Circ and distal circ disease CTO of L circ with collaterals   LHC 2014 CTO of RCA, CTO OM and high grade 90% mCirc. Failed attempt at PCI of OM  10/14/12: Unsuccessful attempt to open CTO of OMB and mid-CFX by Dr. Mackey Birchwood   Continue ASA, BB, STATIN  Denies anginal symptoms     Sinus Tachycardia/Premature ventricular contractions/Non Sustained Ventricular Tachycardia   Received a call from primary team regarding concerns for device non capture. Device interrogation 11/16/16: Device function appropriate. AVD appropriate. Max track rate 130 bpm.BiVP is 88 % due to 12 % PVCs. No new events since check on 11/14/16.   Device re-programming on 11/14/16 for not appropriate BiV pacing.-see report for changes.   Continue BB. No room for up titration with marginal bp at times and lung disease. Patient is asymptomatic.   Please keep K above/= 4.0 and Mag above/= 2.0   ???  Chronic Kidney Disease stage III  BL Cr. ~ 1.4  Cr. Up to 2.07 this admit now back to baseline   fluctuant kidney function as well as orthostatic symptoms in the past.   ???  Hypertension-stable. Marginal at times. Currently on Bumetanide, BB and Cleda Daub as above.    ???  Dyslipidemia-LDL 76 02/25/16. Treated with Atorvastatin 20 mg daily PTA  ???  Chronic Obstructive Pulmonary Disease/Asthma/acute exacerbation of chronic respiratory failure/Sepsis  Blood cultures negative to date  Viral panel was positive for rhinovirus  Sputum cultures grew gram +ve cocci  treated with multiple inhalers, antibiotics and Methylprednisolone IV. Dose to be adjusted down to 62.5 mg q 8 hours 11/10/16  02 requirements down to 6L.   ???  Abdominal Aortic Aneurysm   Duplex done at OSH in 2007 and 2008 ~ 4.1 cm  Duplex 10/15/12-AAA 7.3 cm AP x 7.3 cm  2014 Endovascular repair of large abdominal aortic aneurysm utilizing Gore endovascular device  ???  Obstructive Sleep Apnea treated with CPAP  ???  Morbid Obesity-Body mass index is 42.06 kg/m???. Weight loss, Cardiac Healthy diet, and exercise when patient can tolerate. Dietitian Consult.   ???  PLAN:  Continue current therapy and supportive care.   Primary team planning to discharge today.   Maximize treatment for LVD with GDMT as pt. Can tolerate, renal function allows and BP permits.  Device reprogramming done to optimize BiV pacing on 11/14/16. Tolerating well.     F/u in HF clinic on 11/23/16.   F/u with Dr. Jacqulyn Ducking 12/07/16. Would be happy to see patient again as needed.  Please call as needed for questions or new developments.  Thank you!  Patient was seen and examined by Dr. Vivianne Spence.              Subjective    Chief complaint: HF exacerbation     HPI: Mr. Skluzacek is a 76 y.o. male patient with a history of combined systolic Heart Failure with reduced Ejection  Fraction (HFrEF) with recovery of LVEF to 50% from 20% due to Ischemic Cardiomyopathy (ICM) s/p St. Jude CRT-D in situ sice 2017, Coronary artery disease with known CTO or RCA and OM with unsuccessful attempt at revascularization, s/p CardioMEMS placement in 2017, Chronic Kidney Disease III, Hypertension, Dyslipidemia, Chronic Obstructive Pulmonary Disease/Asthma, chronic hypoxic respiratory with BL of 4L O2, Abdominal Aortic Aneurysm s/p Endovascular Aneurysm Repair (EVAR) 2014, Obstructive Sleep Apnea/CPAP and morbid obesity. He was transferred to [facility] from [facility] with acute exacerbation of chronic respiratory failure. He presented with cough and Shortness of breath. He was requiring BiPAP. Viral panel was positive for Rhinovirus. Blood cultures remain negative. Sputum cultures grew gram +ve cocci. He was treated with antibiotics and prednisone. He was treated with IV furosemide 40 mg initially. His BNP was 92 on admit. CXR revealed development of right basilar consolidation, which may reflect pneumonia, atelectasis, and/or aspiration on admit. Cardiology was consulted for HFrEF management. He was switched to bumetanide 3 mg po bid on 11/10/16 while he was receiving IV steroids. He was placed back on his PTA regimen 2 mg po bid on 11/14/16. His CardioMEMS. Readings are as above. He was noted to have ST and frequent PVC's and NSVT. His device was interrogated and reporgrammed to optimize BiV pacing. He does have increased PVC burden. Will defer on going management to primary cardiologist. There was no room to up-titrate BB given marginal BP and COPD. He is doing well. Looking forward to be discharged home    ROS: Resting in chair. Denies chest pain, shortness of breath, lightheadedness, dizziness, near fainting, palpitations, abd pain, back pain or bleeding issues.     Medications  Scheduled Meds:    albuterol 0.5% (PROVENTIL; VENTOLIN) nebulizer solution 2.5 mg 2.5 mg Inhalation Q6H While Awake & PRN   allopurinol (ZYLOPRIM) tablet 300 mg 300 mg Oral QDAY   amitriptyline/gabapentin/emu oil(#) 05/24/08 % topical cream 5 g 5 g Topical TID   ascorbic acid (VITAMIN C) tablet 500 mg 500 mg Oral QDAY   aspirin tablet 325 mg 325 mg Oral QDAY   atorvastatin (LIPITOR) tablet 20 mg 20 mg Oral QDAY   budesonide/formoterol (SYMBICORT HFA) 160/4.5 mcg inhalation 2 puff 2 puff Inhalation BID   bumetanide (BUMEX) tablet 2 mg 2 mg Oral BID(9-17)   cetirizine (ZYRTEC) tablet 10 mg 10 mg Oral QAM8   cholecalciferol (VITAMIN D-3) tablet 1,000 Units 1,000 Units Oral QDAY   duloxetine DR (CYMBALTA) capsule 60 mg 60 mg Oral QDAY   enoxaparin (LOVENOX) syringe 40 mg 40 mg Subcutaneous BID   ferrous sulfate (FEOSOL, FEROSUL) tablet 325 mg 325 mg Oral QDAY   gabapentin (NEURONTIN) capsule 400 mg 400 mg Oral Q8H*   guaiFENesin LA (MUCINEX) tablet 600 mg 600 mg Oral BID   insulin aspart U-100 (NOVOLOG FLEXPEN) injection PEN 0-28 Units 0-28 Units Subcutaneous ACHS   ipratropium bromide (ATROVENT) 0.02 % nebulizer solution 0.5 mg 0.5 mg Inhalation Q6H While Awake & PRN   melatonin tablet 3 mg 3 mg Oral QHS   metoprolol XL (TOPROL XL) tablet 25 mg 25 mg Oral QDAY   montelukast (SINGULAIR) tablet 10 mg 10 mg Oral QHS   pantoprazole DR (PROTONIX) tablet 40 mg 40 mg Oral BID   polyethylene glycol 3350 (MIRALAX) packet 17 g 1 packet Oral QDAY   prednisone (DELTASONE) tablet 60 mg 60 mg Oral QDAY   senna/docusate (SENOKOT-S) tablet 1 tablet 1 tablet Oral BID   spironolactone (ALDACTONE) tablet 25 mg 25 mg  Oral BID(9-17)   Continuous Infusions: PRN and Respiratory Meds:acetaminophen Q6H PRN, benzonatate TID PRN, codeine/guaiFENesin Q4H PRN, ondansetron Q6H PRN      Objective                       Vital Signs: Last Filed                 Vital Signs: 24 Hour Range   BP: 114/51 (09/27 1143)  Temp: 36.3 ???C (97.3 ???F) (09/27 1143)  Pulse: 95 (09/27 1143)  Respirations: 18 PER MINUTE (09/27 1143)  SpO2: 96 % (09/27 1143)  O2 Delivery: None (Room Air) (09/27 1143) BP: (103-150)/(51-79)   Temp:  [36.2 ???C (97.2 ???F)-36.9 ???C (98.5 ???F)]   Pulse:  [85-116]   Respirations:  [16 PER MINUTE-20 PER MINUTE]   SpO2:  [92 %-97 %]   O2 Delivery: None (Room Air)     Vitals:    11/14/16 0536 11/15/16 0600 11/16/16 0558   Weight: (!) 137.3 kg (302 lb 12.8 oz) 136.1 kg (300 lb) (!) 136.8 kg (301 lb 9.6 oz)         Intake/Output Summary:  (Last 24 hours)    Intake/Output Summary (Last 24 hours) at 11/16/16 1445  Last data filed at 11/16/16 1143   Gross per 24 hour   Intake              660 ml   Output             4200 ml   Net            -3540 ml           Body mass index is 42.06 kg/m???.    Social History   Substance Use Topics   ??? Smoking status: Former Smoker     Packs/day: 2.00     Years: 40.00     Quit date: 07/08/2000   ??? Smokeless tobacco: Never Used   ??? Alcohol use Yes      Comment: on occasion 1-2 drinks/week       Past Surgical History:   Procedure Laterality Date   ??? BACK SURGERY  Jan 2016   ??? VARICOSE VEIN SURGERY Left 01/05/2016    left small saphenous endovenous ablation with left leg microphlebectomy-Dr. Junita Push   ??? HX MICROPHLEBECTOMY Right 07/06/2016    [place]   ??? HX ENDOVENOUS ABLATION OF THE SMALL SAPHENOUS VEIN Right 07/06/2016    [place]   ??? BRONCHOSCOPY     ??? CARDIAC CATHERIZATION  several years ago    [place]   ??? DOPPLER ECHOCARDIOGRAPHY     ??? HERNIA REPAIR     ??? HX CHOLECYSTECTOMY     ??? HX HEART CATHETERIZATION     ??? HX LUMBAR LAMINECTOMY     ??? LUNG SURGERY         Allergies   Allergen Reactions   ??? Other [Unclassified Drug] BLISTERS Hepacleanse , caused blister to skin        Family History   Problem Relation Age of Onset   ??? Cancer Mother         liver   ??? Stroke Sister    ??? Cancer Father    ??? Coronary Artery Disease Father        Physical Exam        GEN: no acute distress  HEENT: nontraumatic, EOM-intact  CHEST: clear to auscultation bilaterally. Wheezing.   CV: Reg rhythm, nml rate; nml  S1 & S2, no S3 or S4; no rub; no murmurs  WRU:EAVWUJ edema, 2+ distal pulses    Lab Review  Results for orders placed or performed during the hospital encounter of 11/07/16 (from the past 24 hour(s))   POC GLUCOSE    Collection Time: 11/15/16  5:11 PM   Result Value Ref Range    Glucose, POC >600 (HH) 70 - 100 MG/DL   POC GLUCOSE    Collection Time: 11/15/16  5:12 PM   Result Value Ref Range    Glucose, POC 248 (H) 70 - 100 MG/DL   POC GLUCOSE    Collection Time: 11/15/16  8:55 PM   Result Value Ref Range    Glucose, POC 202 (H) 70 - 100 MG/DL   BASIC METABOLIC PANEL    Collection Time: 11/16/16  6:30 AM   Result Value Ref Range    Sodium 136 (L) 137 - 147 MMOL/L    Potassium 4.0 3.5 - 5.1 MMOL/L    Chloride 95 (L) 98 - 110 MMOL/L    CO2 31 (H) 21 - 30 MMOL/L    Anion Gap 10 3 - 12    Glucose 152 (H) 70 - 100 MG/DL    Blood Urea Nitrogen 55 (H) 7 - 25 MG/DL    Creatinine 8.11 (H) 0.4 - 1.24 MG/DL    Calcium 8.7 8.5 - 91.4 MG/DL    eGFR Non African American 50 (L) >60 mL/min    eGFR African American >60 >60 mL/min   MAGNESIUM    Collection Time: 11/16/16  6:30 AM   Result Value Ref Range    Magnesium 2.2 1.6 - 2.6 mg/dL   POC GLUCOSE    Collection Time: 11/16/16  7:39 AM   Result Value Ref Range    Glucose, POC 141 (H) 70 - 100 MG/DL   POC GLUCOSE    Collection Time: 11/16/16 11:37 AM   Result Value Ref Range    Glucose, POC 159 (H) 70 - 100 MG/DL       Tele: BiV paced. NSVT. PVC's.       Mayra Brahm A Molly Savarino, PA-C [phone]

## 2016-11-16 NOTE — Progress Notes
Pulmonary Progress Note         Name:  Ruben Wood.   Today's Date:  11/16/2016  Admission Date: 11/07/2016  LOS: 9 days           Impression:   76 y/o M???w/ PMH depression, chronic pain, asthma, CKD, HTN, HLD, CAD, ischemic cardiomyopathy s/p pacer, ICD &???cardioMEMS implant. Presented 9/18 in transfer from [facility] [place] w/ acute hypoxic???respiratory failure. Presented there 9/13 w/ persistent cough causing SOA &???was treated w/ levaquin &???steroid. On 9/18 had progressive increase in O2 requirement &???placed on BiPAP prior to transfer. RVP +rhinovirus &???sputum gram stain w/???many GPC resembling strep (despite being on levaquin).  ???  Ischemic cardiomyopathy s/p pacer, ICD &???cardioMEMS implant  - Cardio MEMS >> PA diastolic 5 today (9/26) (goal 1-61)  - EF 50% on recent echo (limited study)  - Continues on Bumex and spironolactone  - Cardiology following  ???  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 [name] on 10/03/16  - CXR 9/18- right basilar consolidation likely pericardial fat  ???  OSA  - CPAP qhs  - Has home machine here with him, adjusted setting to 6-20 (9/26)  - Machine shows good compliance with therapy and a residual AHI 1.0  ???  Severe Sepsis (resolved), Rhinovirus  - Has had waxing/waning cough for the last month - hx allergic rhinitis & asthma but not improved -> had been given a z-pak 8/21 & then started on doxy 9/10 which he continued till admission to OSH 9/13  - At OSH, he received levaquin 9/13, and then daily starting 9/16  - WBC 9.9???this AM; afebrile  - UA w/o evidence of infection  - Procal 0.08  - RVP: +rhinovirus - Blood cx 9/18: ngtd5  - Sputum cx 9/18: GS with many GPC resembling strep; cx normal flora  - Continue augmentin PO to complete 7d through 9/26    Recommendations:     - Continue Prednisone taper, currently on day 2 of 60mg   ???????????????????????????????????????>>???Plan for slow taper, decrease by 10mg  every 5 days   - EX OX prior to discharge to assess home oxygen needs  - Has follow-up schedule with Dr. [name] on 11/29/2016   - Patient will find out what DME he uses for his CPAP supplies so we can get it into his chart, it is possibly [employer]  - Continue PTA???Symbicort and duonebs  - Aggressive pulmonary hygiene with IS and Waymon Budge     We will continue to follow. Above plan discussed with attending physician, Dr. Pincus Sanes, APRN-NP  Pager [phone]  ________________________________________________________________________    Subjective  Patient is feeling good today and ready to go home. Breathing and cough continue to improve. Not needing oxygen this am.     ROS: Denies fever, chills, nausea, vomiting and diarrhea    Objective  Vital Signs: Last Filed Vital Signs: 24 Hour Range   BP: 103/79 (09/27 0754)  Temp: 36.2 ???C (97.2 ???F) (09/27 0754)  Pulse: 103 (09/27 0800)  Respirations: 20 PER MINUTE (09/27 0754)  SpO2: 95 % (09/27 0754)  O2 Delivery: None (Room Air) (09/27 0754) BP: (103-150)/(52-86)  Temp:  [36.2 ???C (97.2 ???F)-36.9 ???C (98.5 ???F)]   Pulse:  [85-116]   Respirations:  [16 PER MINUTE-20 PER MINUTE]   SpO2:  [92 %-98 %]   O2 Delivery: None (Room Air)       Intake/Output Summary:  (Last 24 hours)    Intake/Output Summary (Last 24 hours) at 11/16/16 1039  Last data filed at 11/16/16 0559   Gross per 24 hour   Intake              660 ml   Output             3600 ml   Net            -2940 ml       General: Awake and alert, in no acute distress.  HEENT: NC/AT, sclera non-icteric, moist mucus membranes.  Neck: no JVD.  CV: regular rhythm, normal rate, no murmurs. Lungs: clear to ausculation bilaterally. No wheezing, normal wob  Abdomen: soft, non-tender, non-distended, normo-active bowel sounds  Extremities: no edema,cool and discolored BLE, pedal pulses 2 (+) bilaterally  Neuro: no focal deficits  Skin: no rashes    Lab Review  Recent CBC   Recent Labs      11/15/16   0535   WBC  13.6*   HGB  13.6   HCT  39.8*   PLTCT  117*   MCV  105.6*        Recent CMP   Recent Labs      11/14/16   0534  11/15/16   0535  11/16/16   0630   NA  134*  133*  136*   K  4.2  4.0  4.0   CL  95*  95*  95*   CO2  27  29  31*   GAP  12  9  10    BUN  57*  59*  55*   CR  1.25*  1.36*  1.37*   GFR  56*  51*  50*   GLU  199*  179*  152*   CA  8.9  8.7  8.7   MG  2.2  2.2  2.2   PO4  3.1  3.5   --    ALBUMIN  3.7  3.4*   --    ALKPHOS  66  60   --    AST  24  16   --    ALT  34  25   --    TOTBILI  1.3*  1.2   --         Other labs   24-hour labs:    Results for orders placed or performed during the hospital encounter of 11/07/16 (from the past 24 hour(s))   POC GLUCOSE    Collection Time: 11/15/16 12:17 PM   Result Value Ref Range    Glucose, POC 133 (H) 70 - 100 MG/DL   POC GLUCOSE    Collection Time: 11/15/16  5:11 PM   Result Value Ref Range    Glucose, POC >600 (HH) 70 - 100 MG/DL   POC GLUCOSE    Collection Time: 11/15/16  5:12 PM   Result Value Ref Range    Glucose, POC 248 (H) 70 - 100 MG/DL   POC GLUCOSE    Collection Time: 11/15/16  8:55 PM   Result Value Ref Range    Glucose, POC 202 (H) 70 - 100 MG/DL   BASIC METABOLIC PANEL    Collection  Time: 11/16/16  6:30 AM   Result Value Ref Range    Sodium 136 (L) 137 - 147 MMOL/L    Potassium 4.0 3.5 - 5.1 MMOL/L    Chloride 95 (L) 98 - 110 MMOL/L    CO2 31 (H) 21 - 30 MMOL/L    Anion Gap 10 3 - 12    Glucose 152 (H) 70 - 100 MG/DL    Blood Urea Nitrogen 55 (H) 7 - 25 MG/DL    Creatinine 1.61 (H) 0.4 - 1.24 MG/DL    Calcium 8.7 8.5 - 09.6 MG/DL    eGFR Non African American 50 (L) >60 mL/min    eGFR African American >60 >60 mL/min   MAGNESIUM Collection Time: 11/16/16  6:30 AM   Result Value Ref Range    Magnesium 2.2 1.6 - 2.6 mg/dL   POC GLUCOSE    Collection Time: 11/16/16  7:39 AM   Result Value Ref Range    Glucose, POC 141 (H) 70 - 100 MG/DL        Point of Care Testing  (Last 24 hours)  Glucose: (!) 152 (11/16/16 0630)  POC Glucose (Download): (!) 141 (11/16/16 0739)    Radiology and other Diagnostics Review:    No new imaging to review    Medications  Scheduled IV PRN     albuterol 0.5% (PROVENTIL; VENTOLIN) nebulizer solution 2.5 mg 2.5 mg Inhalation Q6H While Awake & PRN   allopurinol (ZYLOPRIM) tablet 300 mg 300 mg Oral QDAY   amitriptyline/gabapentin/emu oil(#) 05/24/08 % topical cream 5 g 5 g Topical TID   ascorbic acid (VITAMIN C) tablet 500 mg 500 mg Oral QDAY   aspirin tablet 325 mg 325 mg Oral QDAY   atorvastatin (LIPITOR) tablet 20 mg 20 mg Oral QDAY   budesonide/formoterol (SYMBICORT HFA) 160/4.5 mcg inhalation 2 puff 2 puff Inhalation BID   bumetanide (BUMEX) tablet 2 mg 2 mg Oral BID(9-17)   cetirizine (ZYRTEC) tablet 10 mg 10 mg Oral QAM8   cholecalciferol (VITAMIN D-3) tablet 1,000 Units 1,000 Units Oral QDAY   duloxetine DR (CYMBALTA) capsule 60 mg 60 mg Oral QDAY   enoxaparin (LOVENOX) syringe 40 mg 40 mg Subcutaneous BID   ferrous sulfate (FEOSOL, FEROSUL) tablet 325 mg 325 mg Oral QDAY   gabapentin (NEURONTIN) capsule 400 mg 400 mg Oral Q8H*   guaiFENesin LA (MUCINEX) tablet 600 mg 600 mg Oral BID   insulin aspart U-100 (NOVOLOG FLEXPEN) injection PEN 0-28 Units 0-28 Units Subcutaneous ACHS   ipratropium bromide (ATROVENT) 0.02 % nebulizer solution 0.5 mg 0.5 mg Inhalation Q6H While Awake & PRN   melatonin tablet 3 mg 3 mg Oral QHS   metoprolol XL (TOPROL XL) tablet 25 mg 25 mg Oral QDAY   montelukast (SINGULAIR) tablet 10 mg 10 mg Oral QHS   pantoprazole DR (PROTONIX) tablet 40 mg 40 mg Oral BID   polyethylene glycol 3350 (MIRALAX) packet 17 g 1 packet Oral QDAY   prednisone (DELTASONE) tablet 60 mg 60 mg Oral QDAY senna/docusate (SENOKOT-S) tablet 1 tablet 1 tablet Oral BID   spironolactone (ALDACTONE) tablet 25 mg 25 mg Oral BID(9-17)     acetaminophen Q6H PRN, benzonatate TID PRN, codeine/guaiFENesin Q4H PRN, ondansetron Q6H PRN

## 2016-11-16 NOTE — Progress Notes
Teaching Physician Attestation:  I have personally interviewed and examined the patient, have reviewed the documentation, and agree with the assessment and plan of the resident.  He continues to feel improved a bit.  There is less cough and less wheeze.  His fluid balance is improved.  His cardio mems yesterday was 5.  His pacemaker was checked this morning and is functioning appropriately.  He does have some frequent PVCs which I think results in the telemetry uncertainty.  He has minimal wheeze.  I think he is stable from a cardiac perspective for discharge.  He will continue with his current treatment plan and follow-up with Dr. Jacqulyn Ducking as an outpatient.  Lazarus Gowda, MD

## 2016-11-16 NOTE — Discharge Instructions - Supplementary Instructions
INSULIN INSTRUCTIONS    -If blood glucose is 140-180mg /dL at 07, 11, 17 administer 2 units insulin, at 21 administer 0 units.  -If blood glucose is 181-220mg /dL at 07, 11, 17 administer 4 units insulin, at 21 administer 2 units.  -If blood glucose is 221-260mg /dL at 07, 11, 17 administer 6 units insulin, at 21 administer 4 units.  -If blood glucose is 261-300mg /dL at 07, 11, 17 administer 8 units insulin, at 21 administer 6 units.  -If blood glucose is 301-350mg /dL at 07, 11, 17 administer 10 units insulin, at 21 administer 8 units.  -If blood glucose is 351-400mg /dL at 07, 11, 17 administer 12 units insulin, at 21 administer 10 units.  -If blood glucose is >400mg /dL at 07, 11, 17 administer 14 units insulin, at 21 administer 12 units.

## 2016-11-17 ENCOUNTER — Encounter: Admit: 2016-11-17 | Discharge: 2016-11-17 | Payer: MEDICARE

## 2016-11-17 NOTE — Progress Notes
Day of DC Note    76 y/o Mw/ PMH depression, chronic pain, asthma, CKD, HTN, HLD, CAD, ischemic cardiomyopathy s/p pacer, ICD &cardioMEMS implant. Presented 9/18 in transfer from [facility] w/ acute hypoxicrespiratory failure. Presented there 9/13 w/ persistent cough causing SOA &was treated w/ levaquin &steroid. On 9/18 had progressive increase in O2 requirement &placed on BiPAP prior to transfer to [facility].     Once admitted to [facility] workup was obtained which showed RVP +rhinovirus &sputum gram stain w/many GPC resembling strep (despite being on levaquin).  He was started on IV antibiotics then narrowed antbx to augmentin to complete 7d. He was also started on IV steroids as well.  He was noted to also have decompensated heart failure hence cardiology was consulted and he was started on IV diuretics.  Once he clinically improved he was deescalated to PTA dose of diuretics and taper of steroids.  He was also weaned off of supplemental oxygen.  He was discharged home with appointment follow ups with cardiology and pulmonology.      He was noted to have LE swelling and discoloration which was evaluated by vascular surgery and not noted to have any concerns with arterial insufficiency.    I spent a total of 35 minutes in pt dc care today spent counseling pt and coordinating care.     Isidoro Donning, MD  Med Private[facility]  [phone]

## 2016-11-17 NOTE — Telephone Encounter
Hospital Discharge Follow Up      Reached Patient:Yes     Admission Information:     Hospital Name: [facility]  Admission Date: 11/07/2016  Discharge Date: 11/16/2016    Discharge Diagnosis: Acute respiratory failure with hypoxia, Severe sepsis, Lactic acid acidosis, Hyperglycemia  Was this a readmission? No  If yes, reason: N/A  Hospital Services: Unplanned  Today's call is 1(business) days post discharge      Discharge Instruction Review   Did patient receive and understand discharge instructions? Yes    Home Health ordered? No                 Agency name/telephone number: N/A   Has HH agency contacted patient? No   Caregiver assistance in the home? No   Are there concerns regarding the patient's ADL'S? Yes,  walking.  Patient uses a cane and a walker for ambulation.  Is patient a fall risk? Yes    Special diet? Yes If yes, type: Cardiac diet and fluid restriction of 2000 ml per day      Medication Reconciliation    Changes to pre-hospital medications? Yes   CHANGE how you take:  acetaminophen 500 mg tablet (TYLENOL)   STOP taking:  doxycycline 100 mg tablet (VIBRAMYCIN)  potassium chloride SR 20 mEq tablet (K-DUR)  Were new prescriptions filled?Yes   START taking:  blood sugar diagnostic test strip (GLUCOCARD EXPRESSION)  insulin lispro(+) 100 unit/mL injection PEN (HumaLOG KwikPen Insulin)  insulin pen needles (disposable) 32 gauge x 5/32 pen needle (BD UF NANO PEN NEEDLES)  lancets MISC  prednisone 10 mg tablet (DELTASONE)  Reviewed medications with patient.    Understanding Condition   Having any current symptoms? Yes, Patient states he still has a little bit of a productive cough with yellow sputum and is short of breath at times with exertion.  Patient states that he has left leg edema.  Patient denies wearing O2 at home.  Patient denies nausea/vomiting, fever/chills.  Patient stated his blood sugar was 160 this morning. Patient understands when to seek additional medical care? Yes   Other instructions provided : Activity as tolerated.      Scheduling Follow-up Appointment   Upcoming appointment date and time and with whom scheduled: Future Appointments  Date Time Provider Department Center   11/21/2016 8:00 AM MAC REMOTE MONITORING [facility]CREMOTEHRM MAC Remote   11/21/2016 9:30 AM Dudenhoeffer, Caffrey, APRN-NP IMGENMED [facility] IM   11/23/2016 1:30 PM [place] HF NURSE MACHFC MAC [place]   11/23/2016 2:00 PM Ronni Rumble, PA-C MACHFC MAC [place]   11/29/2016 1:00 PM Lady Saucier, MD IMPULMON [facility] IM   12/05/2016 8:00 PM MAC REMOTE MONITORING MACREMOTEHRM MAC Remote   12/07/2016 9:40 AM Vena Austria, MD [facility] MAC [place]   12/13/2016 11:00 AM Dorthula Matas, MD [facility] SPINE   12/28/2016 10:00 AM Blanca Friend, MD IMALLRGY [facility] IM   01/09/2017 8:00 PM MAC REMOTE MONITORING MACREMOTEHRM MAC Remote   01/16/2017 3:30 PM Lady Saucier, MD IMPULMON [facility] IM   02/14/2017 8:00 PM MAC REMOTE MONITORING MACREMOTEHRM MAC Remote   03/20/2017 8:00 PM MAC REMOTE MONITORING MACREMOTEHRM MAC Remote   04/24/2017 8:00 PM MAC REMOTE MONITORING MACREMOTEHRM MAC Remote   05/29/2017 8:00 PM MAC REMOTE MONITORING MACREMOTEHRM MAC Remote   07/03/2017 8:00 PM MAC REMOTE MONITORING MACREMOTEHRM MAC Remote   08/07/2017 8:00 PM MAC REMOTE MONITORING MACREMOTEHRM MAC Remote   09/11/2017 8:00 PM MAC REMOTE MONITORING MACREMOTEHRM MAC Remote  PCP appointment scheduled?Yes, Date: 11/21/2016   PCP primary location: [facility] [place] [facility]  Specialist appointment scheduled? Yes, with Cardiology 11/23/2016, 12/07/2016; Spine 12/13/2016; Allergy 12/28/2016; Pulmonary 01/16/2017  Both PCP and Specialist appointment scheduled: Yes  Is assistance with transportation needed?No

## 2016-11-18 NOTE — Discharge Instructions
Physician Discharge Summary     Name: Ruben Wood.  Medical Record Number: [id number]  Account Number: [id number]  Date of Birth: [birth year]  Age: 76 y.o.    Admit date: 11/07/2016 Discharge date: 11/16/2016    Service: Med Private G- 1914     Attending Physician: Isidoro Donning, MD    Reason for hospitalization: dyspnea    Primary Diagnosis:   Hospital Problems        Active Problems    OSA on CPAP    GERD (gastroesophageal reflux disease)    CAD (coronary artery disease)    AAA (abdominal aortic aneurysm) (HCC)    CKD (chronic kidney disease) stage 3, GFR 30-59 ml/min (HCC)    Chronic combined systolic and diastolic congestive heart failure, NYHA class 2 (HCC)    Chronic obstructive pulmonary disease (HCC)    Ischemic cardiomyopathy    Moderate persistent asthma with acute exacerbation    Iron deficiency    Acute respiratory failure with hypoxia (HCC)    Severe sepsis (HCC)    Lactic acid acidosis    Hyperglycemia    Rhinovirus infection            Significant PMH/Secondary Diagnosis:   Past Medical History:   Diagnosis Date   ??? AAA (abdominal aortic aneurysm) (HCC) 10/15/2012   ??? Asthma    ??? Bronchiectasis (HCC)    ??? 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  09/12/12   Cath    ??? Cardiomyopathy (HCC) 07/19/2014   ??? Cellulitis of left lower extremity 11/12/2014    10/2014: admitted with cellulitis, [place] negative for venous clot, MRI without osteomyelitis.    11/12/2014 In clinic today, still with cellulitis.  Per patient and his wife, the erythema may be extending.  Cultures from drainage in hospital with MSSA, but Blood Cx negative.  No e/o osteomyelitis.  My concern is that this antibiotic regimen is not adequately treating his cellulitis.  We will broaden coverage to get MRSA with doxycycline.  Patient and wife given strict call/return criteria.  I will see patient in 3-4 days for re-evaluation.  No fever today.  Plan:  Stop cefpodoxime and start doxycylcine  11/17/2014 Much better, no warmth and erythema minimal.   Plan: continue doxycyline for full 10 day course.      ??? Chest wall deformity 07/09/2012    Chest wall reconstruction on 07/09/12    ??? CHF (congestive heart failure) (HCC)    ??? 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 Chronic cough 08/29/2012   ??? 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 CKD (chronic kidney disease) stage 3, GFR 30-59 ml/min (HCC) 08/26/2013   ??? COPD (chronic obstructive pulmonary disease) (HCC) 08/13/2012   ??? Dyspnea    ??? Edema    ??? Essential hypertension 08/13/2012   ??? GERD (gastroesophageal reflux disease) 08/13/2012   ??? History of CHF (congestive heart failure) 08/13/2012   ??? History of MRSA infection 10/14/2012   ??? History of repair of aneurysm of abdominal aorta using  endovascular stent graft 08/26/2013    10/25/12: Successful repair of an abdominal aortic aneurysm utilizing endovascular technique with a Gore Excluder device with 26 x 14 x 18 cm main endoprosthesis on the right and 13.5 cm contralateral leg device successfully delivered without evidence of any endoleaks and excellent results.    ??? HTN (hypertension)    ??? Hyperlipemia    ??? Hypertension 08/13/2012   ??? 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 Mixed dyslipidemia 08/26/2013   ??? Morbid obesity (HCC) 08/13/2012   ??? 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  Discussed the importance of diet.  Exercise as tolerated.      ??? Obesity    ??? On supplemental oxygen therapy    ??? OSA on CPAP 08/13/2012   ??? Pneumonia 04/2012    [facility]   ??? Seasonal allergic reaction    ??? Sleep apnea        Consults: Cardiology Pulm    Allergies: Other [unclassified drug]    Brief Hospital Course:      76 y/o M???w/ PMH depression, chronic pain, asthma, CKD, HTN, HLD, CAD, ischemic cardiomyopathy s/p pacer, ICD &???cardioMEMS implant. Presented 9/18 in transfer from [facility] w/ acute hypoxic???respiratory failure. Presented there 9/13 w/ persistent cough causing SOA &???was treated w/ levaquin &???steroid. On 9/18 had progressive increase in O2 requirement &???placed on BiPAP prior to transfer to [facility].   ???  Once admitted to [facility] workup was obtained which showed RVP +rhinovirus &???sputum gram stain w/???many GPC resembling strep (despite being on levaquin).  He was started on IV antibiotics then narrowed antbx to augmentin to complete 7d. He was also started on IV steroids as well.  He was noted to also have decompensated heart failure hence cardiology was consulted and he was started on IV diuretics.  Once he clinically improved he was deescalated to PTA dose of diuretics and taper of steroids.  He was also weaned off of supplemental oxygen.  He was discharged home with appointment follow ups with cardiology and pulmonology.    ???  He was noted to have LE swelling and discoloration which was evaluated by vascular surgery and not noted to have any concerns with arterial insufficiency.      Condition at Discharge (Status/Functional/Cognitive): improved    Key Surgical/Diagnostic Studies and Procedures: see above    Patient Disposition: Home       Patient instructions:    Activity as Tolerated   It is important to keep increasing your activity level after you leave the hospital.  Moving around can help prevent blood clots, lung infection (pneumonia) and other problems.  Gradually increasing the number of times you are up moving around will help you return to your normal activity level more quickly.  Continue to increase the number of times you are up to the chair and walking daily to return to your normal activity level. Begin to work toward your normal activity level at discharge     Report These Signs and Symptoms   Please contact your doctor if you have any of the following symptoms: difficulty breathing or chest pain     Questions About Your Stay   For questions or concerns regarding  your hospital stay. Call [phone]   Discharging attending physician: Alda Berthold [[id number]]      Cardiac Diet   Limiting unhealthy fats and cholesterol is the most important step you can take in reducing your risk for cardiovascular disease.  Unhealthy fats include saturated and trans fats.  Monitor your sodium and cholesterol intake.  Restrict your sodium to 2g (grams) or 2000mg  (milligrams) daily, and your cholesterol to 200mg  daily.    If you have questions regarding your diet at home, you may contact a dietitian at [phone].       Fluid Restrictions   Limit the amount of your daily fliuds to (milliliters).  This is equal to about 8 cups a day..    If you have questions about your diet after you go home, you can call a dietitian at [phone].       Current Discharge Medication List       START taking these medications    Details   blood sugar diagnostic (GLUCOCARD EXPRESSION) test strip Use one strip as directed before meals and at bedtime.  Qty: 100 strip, Refills: 0    PRESCRIPTION TYPE:  Normal      blood-glucose meter (ACCU-CHEK AVIVA PLUS METER) kit Use  as directed.  Qty: 1 kit, Refills: 0    PRESCRIPTION TYPE:  Normal      insulin aspart U-100 (NOVOLOG FLEXPEN U-100 INSULIN) 100 unit/mL injection PEN use sliding scale --max dose of 25 units per day Qty: 15 mL, Refills: 0    PRESCRIPTION TYPE:  Normal      !! insulin lispro(+) (HUMALOG KWIKPEN INSULIN) 100 unit/mL injection PEN see additional instructions section for insulin instructions  Qty: 15 mL, Refills: 0    PRESCRIPTION TYPE:  Normal      !! insulin lispro(+) (HUMALOG KWIKPEN INSULIN) 100 unit/mL injection PEN Use sliding scale --max of 25 units per day  Qty: 15 mL, Refills: 0    PRESCRIPTION TYPE:  Normal      insulin pen needles (disposable) (BD UF NANO PEN NEEDLES) 32 gauge x 5/32 pen needle Use one each as directed as Needed. Use with insulin injections.  Qty: 100 each, Refills: 0    PRESCRIPTION TYPE:  Normal      lancets MISC Use one each as directed before meals and at bedtime. Diagnosis: steroid induced hyperglycemia  Qty: 100 each, Refills: 0    PRESCRIPTION TYPE:  Normal       !! - Potential duplicate medications found. Please discuss with provider.       CONTINUE these medications which have been CHANGED or REFILLED    Details   acetaminophen (TYLENOL) 500 mg tablet Take one tablet by mouth every 6 hours as needed for Pain. Max of 4,000 mg of acetaminophen in 24 hours.    PRESCRIPTION TYPE:  No Print  Associated Diagnoses: Lumbar back pain      prednisone (DELTASONE) 10 mg tablet Take 60mg  by mouth for 3 days. Starting 10/1 take 50mg  by mouth daily for 5 days. Starting 10/6 take 40mg  by mouth daily for 5 days, then on 10/11 take 30mg  by mouth daily for 5 days, then on 10/16 take 20mg  by mouth daily for 5 days, then 10/21 take 10mg  by mouth daily for 5 days then stop.  Qty: 93 tablet, Refills: 0    PRESCRIPTION TYPE:  Fax  This prescription was faxed to the pharmacy  Pharmacy: [facility] [place], [place] - [address] (  Ph #: [phone])          CONTINUE these medications which have NOT CHANGED    Details   albuterol (PROAIR HFA, VENTOLIN HFA, OR PROVENTIL HFA) 90 mcg/actuation inhaler Inhale two puffs by mouth into the lungs every 4 hours as needed for Wheezing or Shortness of Breath.  Qty: 1 Inhaler, Refills: 0    PRESCRIPTION TYPE:  Normal  Associated Diagnoses: Moderate persistent asthma with acute exacerbation      albuterol 0.083% (PROVENTIL; VENTOLIN) 2.5 mg /3 mL (0.083 %) nebulizer solution Inhale 3 mL solution by nebulizer as directed every 6 hours as needed for Wheezing or Shortness of Breath.    PRESCRIPTION TYPE:  Historical Med      allopurinol (ZYLOPRIM) 300 mg tablet Take one tablet by mouth daily.  Qty: 90 tablet, Refills: 3    PRESCRIPTION TYPE:  Normal      AMITRIPTYLINE HCL (AMITRIPTYLINE/GABAPENTIN/EMU OIL(#)) 05/24/08 % Apply 5 g topically to affected area three times daily.  Qty: 100 g, Refills: 11    PRESCRIPTION TYPE:  Print      ascorbic acid (VITAMIN C) 500 mg tablet Take one tablet by mouth daily.  Qty: 90 tablet, Refills: 3    PRESCRIPTION TYPE:  Normal      aspirin 325 mg tablet Take 325 mg by mouth daily. Take with food.    PRESCRIPTION TYPE:  Historical Med      atorvastatin (LIPITOR) 20 mg tablet TAKE ONE TABLET BY MOUTH ONCE DAILY  Qty: 90 tablet, Refills: 3    PRESCRIPTION TYPE:  Normal      benzonatate (TESSALON PERLES) 100 mg capsule Take one capsule by mouth three times daily as needed for Cough.  Qty: 20 capsule, Refills: 0    PRESCRIPTION TYPE:  Normal      budesonide/formoterol (SYMBICORT HFA) 160/4.5 mcg inhalation Inhale two puffs by mouth into the lungs twice daily. Rinse mouth after use.  Qty: 3 Inhaler, Refills: 3    PRESCRIPTION TYPE:  Normal  Associated Diagnoses: Moderate persistent asthma with acute exacerbation      bumetanide (BUMEX) 2 mg tablet Take one tablet by mouth twice daily.  Qty: 180 tablet, Refills: 3    PRESCRIPTION TYPE:  Normal  Comments: 6/29 Sending updated script with corrected quantity per request.      cetirizine (ZYRTEC) 10 mg tablet Take 10 mg by mouth every morning.    PRESCRIPTION TYPE:  Historical Med      cholecalciferol (VITAMIN D-3) 1,000 units tablet Take 1,000 Units by mouth daily. PRESCRIPTION TYPE:  Historical Med      codeine/guaiFENesin (ROBITUSSIN-AC) 10/100 mg/5 mL oral solution Take 5 mL by mouth every 4 hours as needed for Cough.  Qty: 118 mL, Refills: 0    PRESCRIPTION TYPE:  Print      duloxetine DR (CYMBALTA) 60 mg capsule Take one capsule by mouth daily.  Qty: 90 capsule, Refills: 3    PRESCRIPTION TYPE:  Normal      ferrous sulfate (FEOSOL, FEROSUL) 325 mg (65 mg iron) tablet Take one tablet by mouth daily. Take on empty stomach at least 1 hr before or 2 hrs after food.Take w/ vitamin C 500mg  for better absorption  Qty: 90 tablet, Refills: 3    PRESCRIPTION TYPE:  Normal      Flunisolide 25 mcg (0.025 %) spry Apply 2 sprays to each nostril as directed twice daily.  Qty: 25 mL, Refills: 3    PRESCRIPTION TYPE:  Normal  Associated  Diagnoses: Rhinoconjunctivitis      gabapentin (NEURONTIN) 400 mg capsule Take one capsule by mouth every 8 hours.  Qty: 270 capsule, Refills: 3    PRESCRIPTION TYPE:  Normal      ipratropium bromide (ATROVENT) 42 mcg (0.06 %) nasal spray 1 spray to each nostril Q6H PRN  Qty: 15 mL, Refills: 12    PRESCRIPTION TYPE:  Normal      L.ACID/L.CASEI/B.BIF/B.LON/FOS (PROBIOTIC BLEND PO) Take 1 Cap by mouth.    PRESCRIPTION TYPE:  Historical Med      lutein-zeaxanthin 25-5 mg cap Take 1 capsule by mouth daily.    PRESCRIPTION TYPE:  Historical Med      metoprolol XL (TOPROL XL) 25 mg extended release tablet Take 1 tablet by mouth daily.  Qty: 90 tablet, Refills: 3    PRESCRIPTION TYPE:  Normal      montelukast (SINGULAIR) 10 mg tablet TAKE ONE TABLET BY MOUTH AT BEDTIME. MAINTENANCE THERAPY FOR ASTHMA.  Qty: 30 Tab, Refills: 0    PRESCRIPTION TYPE:  Normal      nebulizer compressor medical device Use as directed.  Qty: 1 Device, Refills: 0    PRESCRIPTION TYPE:  Print      nitroglycerin (NITROSTAT) 0.4 mg tablet Place 1 Tab under tongue every 5 minutes as needed for Chest Pain.  Qty: 25 Tab, Refills: 1    PRESCRIPTION TYPE:  Print Associated Diagnoses: CAD (coronary artery disease); Dyspnea; Chest wall deformity, acquired; Hypertension; OSA on CPAP; COPD (chronic obstructive pulmonary disease) (HCC); GERD (gastroesophageal reflux disease); Morbid obesity (HCC); History of CHF (congestive heart failure); Chronic cough; AAA (abdominal aortic aneurysm) (HCC)      pantoprazole DR (PROTONIX) 40 mg tablet Take one tablet by mouth twice daily.  Qty: 60 tablet, Refills: 11    PRESCRIPTION TYPE:  Normal      ranitidine(+) (ZANTAC) 150 mg tablet TAKE ONE TABLET BY MOUTH ONCE DAILY  Qty: 60 tablet, Refills: 2    PRESCRIPTION TYPE:  Normal  Comments: Please consider 90 day supplies to promote better adherence  Associated Diagnoses: Chronic obstructive pulmonary disease, unspecified COPD type (HCC); Gastroesophageal reflux disease, esophagitis presence not specified      senna/docusate (SENOKOT-S) 8.6/50 mg tablet Take 1 Tab by mouth twice daily.  Qty: 60 Tab, Refills: 0    PRESCRIPTION TYPE:  OTC      sildenafil(+) (VIAGRA) 50 mg tablet Take 1 tablet by mouth as Needed for Erectile dysfunction.  Qty: 10 tablet, Refills: 5    PRESCRIPTION TYPE:  Normal      spironolactone (ALDACTONE) 25 mg tablet Take 1 tablet by mouth twice daily.  Qty: 180 tablet, Refills: 3    PRESCRIPTION TYPE:  Normal          The following medications were removed from your list. This list includes medications discontinued this stay and those removed from your prior med list in our system        acetaminophen/codeine (TYLENOL #3) 300/30 mg tablet        albuterol 0.5% (PROVENTIL; VENTOLIN) 2.5 mg/0.5 mL nebulizer solution        albuterol-ipratropium (DUONEB) 0.5 mg-3 mg(2.5 mg base)/3 mL nebulizer solution        azithromycin (ZITHROMAX) 250 mg tablet        cephalexin (KEFLEX) 500 mg capsule        doxycycline (VIBRAMYCIN) 100 mg tablet        GUAIFENESIN (MUCINEX PO)        lidocaine (LIDODERM)  5 % topical patch        mupirocin (BACTROBAN) 2 % topical ointment oxyCODONE (ROXICODONE, OXY-IR) 5 mg tablet        potassium chloride SR (K-DUR) 20 mEq tablet        tiZANidine (ZANAFLEX) 4 mg tablet              Scheduled appointments:    Nov 21, 2016  9:30 AM CDT  Urgent Care with Luiz Iron, APRN-NP  [facility]) [facility] Level 4b  [address]  [place] [address]  [phone]   Nov 23, 2016  1:30 PM CDT  Nurse Lab Visit with [facility] HF NURSE  [facility]) [facility] [id number]  [address]  [place] [address]  [phone]   Nov 23, 2016  2:00 PM CDT  Hospital Follow Up with Ronni Rumble, PA-C  [facility]) [facility] [id number]  [address]  [place] [address]  [phone]   Nov 29, 2016  1:00 PM CDT  Return Patient with Lady Saucier, MD  [facility]) [facility] Level 5a  [address]  [place] [address]  [phone]   Dec 07, 2016  9:40 AM CDT  Return Patient with Vena Austria, MD  [facility] ) [facility]  [address]  [place] [address]  [phone]   Dec 13, 2016 11:00 AM CDT  New Patient with Dorthula Matas, MD  [facility]  [address]  [place] [address]  [phone]   Dec 28, 2016 10:00 AM CST  Return Patient with Blanca Friend, MD  [facility]) [facility] Level 4a  [address]  [place] [address]  [phone]   Jan 16, 2017  3:30 PM CST  Return Patient with Lady Saucier, MD  [facility]) [facility] Level 5a  [address]  [place] [address]  [phone]          Signed:  Alda Berthold, MD  11/17/2016 cc:  Primary Care Physician: Comfort, [name]  Verified    Referring Physicians: Gar Ponto, MD

## 2016-11-20 ENCOUNTER — Encounter: Admit: 2016-11-20 | Discharge: 2016-11-20 | Payer: MEDICARE

## 2016-11-21 ENCOUNTER — Ambulatory Visit: Admit: 2016-11-21 | Discharge: 2016-11-21 | Payer: MEDICARE

## 2016-11-21 ENCOUNTER — Ambulatory Visit: Admit: 2016-11-21 | Discharge: 2016-11-22 | Payer: MEDICARE

## 2016-11-21 ENCOUNTER — Encounter: Admit: 2016-11-21 | Discharge: 2016-11-21 | Payer: MEDICARE

## 2016-11-21 DIAGNOSIS — Z09 Encounter for follow-up examination after completed treatment for conditions other than malignant neoplasm: ICD-10-CM

## 2016-11-21 DIAGNOSIS — R6520 Severe sepsis without septic shock: ICD-10-CM

## 2016-11-21 DIAGNOSIS — A419 Sepsis, unspecified organism: Principal | ICD-10-CM

## 2016-11-21 DIAGNOSIS — B370 Candidal stomatitis: ICD-10-CM

## 2016-11-21 DIAGNOSIS — B348 Other viral infections of unspecified site: ICD-10-CM

## 2016-11-21 DIAGNOSIS — Z95810 Presence of automatic (implantable) cardiac defibrillator: ICD-10-CM

## 2016-11-21 DIAGNOSIS — E872 Acidosis: ICD-10-CM

## 2016-11-21 DIAGNOSIS — J449 Chronic obstructive pulmonary disease, unspecified: ICD-10-CM

## 2016-11-21 DIAGNOSIS — I429 Cardiomyopathy, unspecified: ICD-10-CM

## 2016-11-21 DIAGNOSIS — R0600 Dyspnea, unspecified: ICD-10-CM

## 2016-11-21 DIAGNOSIS — I509 Heart failure, unspecified: ICD-10-CM

## 2016-11-21 DIAGNOSIS — L03116 Cellulitis of left lower limb: ICD-10-CM

## 2016-11-21 DIAGNOSIS — R609 Edema, unspecified: ICD-10-CM

## 2016-11-21 DIAGNOSIS — R739 Hyperglycemia, unspecified: ICD-10-CM

## 2016-11-21 DIAGNOSIS — I10 Essential (primary) hypertension: ICD-10-CM

## 2016-11-21 DIAGNOSIS — I5042 Chronic combined systolic (congestive) and diastolic (congestive) heart failure: ICD-10-CM

## 2016-11-21 DIAGNOSIS — J45909 Unspecified asthma, uncomplicated: ICD-10-CM

## 2016-11-21 DIAGNOSIS — J9601 Acute respiratory failure with hypoxia: ICD-10-CM

## 2016-11-21 DIAGNOSIS — Z9981 Dependence on supplemental oxygen: ICD-10-CM

## 2016-11-21 DIAGNOSIS — M954 Acquired deformity of chest and rib: ICD-10-CM

## 2016-11-21 DIAGNOSIS — R05 Cough: ICD-10-CM

## 2016-11-21 DIAGNOSIS — G4730 Sleep apnea, unspecified: ICD-10-CM

## 2016-11-21 DIAGNOSIS — E782 Mixed hyperlipidemia: ICD-10-CM

## 2016-11-21 DIAGNOSIS — I2510 Atherosclerotic heart disease of native coronary artery without angina pectoris: Secondary | ICD-10-CM

## 2016-11-21 DIAGNOSIS — I714 Abdominal aortic aneurysm, without rupture: ICD-10-CM

## 2016-11-21 DIAGNOSIS — M961 Postlaminectomy syndrome, not elsewhere classified: ICD-10-CM

## 2016-11-21 DIAGNOSIS — J439 Emphysema, unspecified: ICD-10-CM

## 2016-11-21 DIAGNOSIS — E785 Hyperlipidemia, unspecified: ICD-10-CM

## 2016-11-21 DIAGNOSIS — Z95828 Presence of other vascular implants and grafts: ICD-10-CM

## 2016-11-21 DIAGNOSIS — R5381 Other malaise: ICD-10-CM

## 2016-11-21 DIAGNOSIS — J189 Pneumonia, unspecified organism: ICD-10-CM

## 2016-11-21 DIAGNOSIS — K219 Gastro-esophageal reflux disease without esophagitis: ICD-10-CM

## 2016-11-21 DIAGNOSIS — G4733 Obstructive sleep apnea (adult) (pediatric): ICD-10-CM

## 2016-11-21 DIAGNOSIS — E669 Obesity, unspecified: ICD-10-CM

## 2016-11-21 DIAGNOSIS — J479 Bronchiectasis, uncomplicated: ICD-10-CM

## 2016-11-21 DIAGNOSIS — N183 Chronic kidney disease, stage 3 (moderate): ICD-10-CM

## 2016-11-21 DIAGNOSIS — J302 Other seasonal allergic rhinitis: ICD-10-CM

## 2016-11-21 DIAGNOSIS — R531 Weakness: ICD-10-CM

## 2016-11-21 DIAGNOSIS — Z8679 Personal history of other diseases of the circulatory system: ICD-10-CM

## 2016-11-21 DIAGNOSIS — Z8614 Personal history of Methicillin resistant Staphylococcus aureus infection: ICD-10-CM

## 2016-11-21 LAB — BNP (B-TYPE NATRIURETIC PEPTI): Lab: 142 pg/mL — ABNORMAL HIGH (ref 0–100)

## 2016-11-21 LAB — COMPREHENSIVE METABOLIC PANEL
Lab: 1.5 mg/dL — ABNORMAL HIGH (ref 0.4–1.24)
Lab: 1.6 mg/dL — ABNORMAL HIGH (ref 0.3–1.2)
Lab: 136 MMOL/L — ABNORMAL LOW (ref 137–147)
Lab: 14 10*3/uL — ABNORMAL HIGH (ref 3–12)
Lab: 19 U/L (ref 7–40)
Lab: 194 mg/dL — ABNORMAL HIGH (ref 70–100)
Lab: 23 MMOL/L (ref 21–30)
Lab: 32 U/L — ABNORMAL HIGH (ref 7–56)
Lab: 4 g/dL — ABNORMAL LOW (ref 3.5–5.0)
Lab: 4.9 MMOL/L (ref 3.5–5.1)
Lab: 45 mL/min — ABNORMAL LOW (ref >60)
Lab: 54 mL/min — ABNORMAL LOW (ref >60)
Lab: 6.6 g/dL (ref 6.0–8.0)
Lab: 62 mg/dL — ABNORMAL HIGH (ref 7–25)
Lab: 75 U/L — ABNORMAL LOW (ref 25–110)
Lab: 9.2 mg/dL (ref 8.5–10.6)

## 2016-11-21 LAB — CBC AND DIFF
Lab: 0 10*3/uL (ref 0–0.20)
Lab: 13 10*3/uL — ABNORMAL HIGH (ref 4.5–11.0)
Lab: 3.8 M/UL — ABNORMAL LOW (ref 4.4–5.5)

## 2016-11-21 MED ORDER — NYSTATIN 100,000 UNIT/ML PO SUSP
500000 [IU] | Freq: Four times a day (QID) | ORAL | 0 refills | 12.00000 days | Status: AC
Start: 2016-11-21 — End: ?

## 2016-11-21 NOTE — Progress Notes
Date of Service: 11/21/2016    Ruben Cashdollar. is a 76 y.o. male. DOB: 1940/09/06   MRN#: [id number]    Subjective:   No chief complaint on file.         History of Present Illness  Ruben Wood. is 76 y.o. patient who presents to clinic for hospital follow up visit.     Brief hospital course from discharge summary:  76 y/o M???w/ PMH depression, chronic pain, asthma, CKD, HTN, HLD, CAD, ischemic cardiomyopathy s/p pacer, ICD &???cardioMEMS implant. Presented 9/18 in transfer from [facility] w/ acute hypoxic???respiratory failure. Presented there 9/13 w/ persistent cough causing SOA &???was treated w/ levaquin &???steroid. On 9/18 had progressive increase in O2 requirement &???placed on BiPAP prior to transfer to [facility].   ???  Once admitted to [facility] workup was obtained which showed RVP +rhinovirus &???sputum gram stain w/???many GPC resembling strep (despite being on levaquin). ???He was started on IV antibiotics then???narrowed antbx to augmentin to complete 7d. He was also started on IV steroids as well. ???He was noted to also have decompensated heart failure hence cardiology was consulted and he was started on IV diuretics. ???Once he clinically improved he was deescalated to PTA dose of diuretics and taper of steroids. ???He was also weaned off of supplemental oxygen. ???He was discharged home with appointment follow ups with cardiology and pulmonology. ???  ???  He was noted to have LE swelling and discoloration which was evaluated by vascular surgery and not noted to have any concerns with arterial insufficiency.    ----------------------------  Patient states he has not noticed significant improvement since being home from the hospital.  He continues to have frequent cough with mucus production that is white.  He continues to have shortness of breath that worsens with exertion.  He has noticed wheezing in his lungs.  He is very tired and weak and feels like he has no strength to do anything. Showering is a Personal assistant.  He is taking the prednisone taper as directed upon hospital discharge.  He is quite currently taking 40 mg/day.  He has noticed he is very hungry with use of the medication.  He is using NovoLog sliding scale with meals while on the prednisone because his blood sugars increased with medication use.    Patient has noticed a blood blister on the left toe.  He would like this looked at today.  Vascular surgery looked at it in the hospital and stated no intervention was necessary.  Does not know if he should possible blood blister not.  It is painful and difficult to wear shoes.    Patient has follow-up with cardiology on October 4 and plans to attend.    Patient is concerned he has oral thrush.  He would like this looked at today.  His mouth is very dry and his tongue hurts.    Past Medical History:   Diagnosis Date   ??? AAA (abdominal aortic aneurysm) (HCC) 10/15/2012   ??? Asthma    ??? Bronchiectasis (HCC)    ??? CAD (coronary artery disease) 08/15/2012    07/11/2002- Cath @ [facility] showed Severe double vessel disease(OMB of circumflex and distal circ)  inferior-basilar dyskinesis.                 Elevated LVEDP, minimal pulmonary hypertension, all similar to cath done 01/1994 with essentially no change( cath results scanned into chart)  Chronic total occlusion of left circumflex coronary artery with collateral  filling.  09/12/12   Cath    ??? Cardiomyopathy (HCC) 07/19/2014   ??? Cellulitis of left lower extremity 11/12/2014    10/2014: admitted with cellulitis, [place] negative for venous clot, MRI without osteomyelitis.    11/12/2014 In clinic today, still with cellulitis.  Per patient and his wife, the erythema may be extending.  Cultures from drainage in hospital with MSSA, but Blood Cx negative.  No e/o osteomyelitis.  My concern is that this antibiotic regimen is not adequately treating his cellulitis.  We will broaden coverage to get MRSA with doxycycline.  Patient and wife given strict call/return criteria.  I will see patient in 3-4 days for re-evaluation.  No fever today.  Plan:  Stop cefpodoxime and start doxycylcine  11/17/2014 Much better, no warmth and erythema minimal.   Plan: continue doxycyline for full 10 day course.      ??? Chest wall deformity 07/09/2012    Chest wall reconstruction on 07/09/12    ??? CHF (congestive heart failure) (HCC)    ??? 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 Chronic cough 08/29/2012   ??? 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 CKD (chronic kidney disease) stage 3, GFR 30-59 ml/min (HCC) 08/26/2013   ??? COPD (chronic obstructive pulmonary disease) (HCC) 08/13/2012   ??? Dyspnea    ??? Edema    ??? Essential hypertension 08/13/2012   ??? GERD (gastroesophageal reflux disease) 08/13/2012   ??? History of CHF (congestive heart failure) 08/13/2012   ??? History of MRSA infection 10/14/2012   ??? 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    ??? HTN (hypertension)    ??? Hyperlipemia    ??? Hypertension 08/13/2012   ??? 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 Mixed dyslipidemia 08/26/2013   ??? Morbid obesity (HCC) 08/13/2012   ??? 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  Discussed the importance of diet.  Exercise as tolerated.      ??? Obesity    ??? On supplemental oxygen therapy    ??? OSA on CPAP 08/13/2012 ??? Pneumonia 04/2012    [facility]   ??? Seasonal allergic reaction    ??? Sleep apnea        Past Surgical History:   Procedure Laterality Date   ??? BACK SURGERY  Jan 2016   ??? VARICOSE VEIN SURGERY Left 01/05/2016    left small saphenous endovenous ablation with left leg microphlebectomy-Dr. Junita Push   ??? HX MICROPHLEBECTOMY Right 07/06/2016    [place]   ??? HX ENDOVENOUS ABLATION OF THE SMALL SAPHENOUS VEIN Right 07/06/2016    [facility]   ??? BRONCHOSCOPY     ??? CARDIAC CATHERIZATION  several years ago    [place]   ??? DOPPLER ECHOCARDIOGRAPHY     ??? HERNIA REPAIR     ??? HX CHOLECYSTECTOMY     ???  HX HEART CATHETERIZATION     ??? HX LUMBAR LAMINECTOMY     ??? LUNG SURGERY         family history includes Cancer in his father and mother; Coronary Artery Disease in his father; Stroke in his sister.    Social History     Social History   ??? Marital status: Married     Spouse name: [name]   ??? Number of children: 0   ??? Years of education: N/A     Occupational History   ??? former Warden/ranger Retired     lives in a 77 year old house with wife     Social History Main Topics   ??? Smoking status: Former Smoker     Packs/day: 2.00     Years: 40.00     Quit date: 07/08/2000   ??? Smokeless tobacco: Never Used   ??? Alcohol use Yes      Comment: on occasion 1-2 drinks/week   ??? Drug use: No   ??? Sexual activity: No     Other Topics Concern   ??? Not on file     Social History Narrative   ??? No narrative on file       Social History   Substance Use Topics   ??? Smoking status: Former Smoker     Packs/day: 2.00     Years: 40.00     Quit date: 07/08/2000   ??? Smokeless tobacco: Never Used   ??? Alcohol use Yes      Comment: on occasion 1-2 drinks/week            Review of Systems   Constitution: Positive for weakness and malaise/fatigue. Negative for chills, decreased appetite, diaphoresis, fever and weight loss.   HENT: Negative for congestion, ear pain and sore throat.         + rhinorrhea  Dry mouth, tongue sensitivity Cardiovascular: Positive for leg swelling. Negative for chest pain, palpitations and syncope.   Respiratory: Positive for cough, shortness of breath, sputum production and wheezing.    Skin: Negative for rash.   Gastrointestinal: Negative for abdominal pain, change in bowel habit, hematochezia, melena, nausea and vomiting.   Neurological: Negative for dizziness, headaches and light-headedness.   Psychiatric/Behavioral: Negative for altered mental status.         Objective:     ??? acetaminophen (TYLENOL) 500 mg tablet Take one tablet by mouth every 6 hours as needed for Pain. Max of 4,000 mg of acetaminophen in 24 hours.   ??? albuterol (PROAIR HFA, VENTOLIN HFA, OR PROVENTIL HFA) 90 mcg/actuation inhaler Inhale two puffs by mouth into the lungs every 4 hours as needed for Wheezing or Shortness of Breath.   ??? albuterol 0.083% (PROVENTIL; VENTOLIN) 2.5 mg /3 mL (0.083 %) nebulizer solution Inhale 3 mL solution by nebulizer as directed every 6 hours as needed for Wheezing or Shortness of Breath.   ??? allopurinol (ZYLOPRIM) 300 mg tablet Take one tablet by mouth daily.   ??? AMITRIPTYLINE HCL (AMITRIPTYLINE/GABAPENTIN/EMU OIL(#)) 05/24/08 % Apply 5 g topically to affected area three times daily.   ??? ascorbic acid (VITAMIN C) 500 mg tablet Take one tablet by mouth daily.   ??? aspirin 325 mg tablet Take 325 mg by mouth daily. Take with food.   ??? atorvastatin (LIPITOR) 20 mg tablet TAKE ONE TABLET BY MOUTH ONCE DAILY   ??? blood sugar diagnostic (GLUCOCARD EXPRESSION) test strip Use one strip as directed before meals and at bedtime.   ???  blood-glucose meter (ACCU-CHEK AVIVA PLUS METER) kit Use  as directed.   ??? budesonide/formoterol (SYMBICORT HFA) 160/4.5 mcg inhalation Inhale two puffs by mouth into the lungs twice daily. Rinse mouth after use.   ??? bumetanide (BUMEX) 2 mg tablet Take one tablet by mouth twice daily.   ??? cetirizine (ZYRTEC) 10 mg tablet Take 10 mg by mouth every morning. ??? cholecalciferol (VITAMIN D-3) 1,000 units tablet Take 1,000 Units by mouth daily.   ??? codeine/guaiFENesin (ROBITUSSIN-AC) 10/100 mg/5 mL oral solution Take 5 mL by mouth every 4 hours as needed for Cough.   ??? duloxetine DR (CYMBALTA) 60 mg capsule Take one capsule by mouth daily.   ??? ferrous sulfate (FEOSOL, FEROSUL) 325 mg (65 mg iron) tablet Take one tablet by mouth daily. Take on empty stomach at least 1 hr before or 2 hrs after food.Take w/ vitamin C 500mg  for better absorption   ??? Flunisolide 25 mcg (0.025 %) spry Apply 2 sprays to each nostril as directed twice daily.   ??? gabapentin (NEURONTIN) 400 mg capsule Take one capsule by mouth every 8 hours.   ??? insulin aspart U-100 (NOVOLOG FLEXPEN U-100 INSULIN) 100 unit/mL injection PEN use sliding scale --max dose of 25 units per day   ??? insulin lispro(+) (HUMALOG KWIKPEN INSULIN) 100 unit/mL injection PEN see additional instructions section for insulin instructions   ??? insulin lispro(+) (HUMALOG KWIKPEN INSULIN) 100 unit/mL injection PEN Use sliding scale --max of 25 units per day   ??? insulin pen needles (disposable) (BD UF NANO PEN NEEDLES) 32 gauge x 5/32 pen needle Use one each as directed as Needed. Use with insulin injections.   ??? ipratropium bromide (ATROVENT) 42 mcg (0.06 %) nasal spray 1 spray to each nostril Q6H PRN   ??? L.ACID/L.CASEI/B.BIF/B.LON/FOS (PROBIOTIC BLEND PO) Take 1 Cap by mouth.   ??? lancets MISC Use one each as directed before meals and at bedtime. Diagnosis: steroid induced hyperglycemia   ??? lutein-zeaxanthin 25-5 mg cap Take 1 capsule by mouth daily.   ??? metoprolol XL (TOPROL XL) 25 mg extended release tablet Take 1 tablet by mouth daily.   ??? montelukast (SINGULAIR) 10 mg tablet TAKE ONE TABLET BY MOUTH AT BEDTIME. MAINTENANCE THERAPY FOR ASTHMA.   ??? nebulizer compressor medical device Use as directed.   ??? nitroglycerin (NITROSTAT) 0.4 mg tablet Place 1 Tab under tongue every 5 minutes as needed for Chest Pain. ??? nystatin (MYCOSTATIN) 100,000 units/mL oral suspension Take 5 mL by mouth four times daily for 7 days.   ??? pantoprazole DR (PROTONIX) 40 mg tablet Take one tablet by mouth twice daily.   ??? prednisone (DELTASONE) 10 mg tablet Take 60mg  by mouth for 3 days. Starting 10/1 take 50mg  by mouth daily for 5 days. Starting 10/6 take 40mg  by mouth daily for 5 days, then on 10/11 take 30mg  by mouth daily for 5 days, then on 10/16 take 20mg  by mouth daily for 5 days, then 10/21 take 10mg  by mouth daily for 5 days then stop.   ??? ranitidine(+) (ZANTAC) 150 mg tablet TAKE ONE TABLET BY MOUTH ONCE DAILY   ??? senna/docusate (SENOKOT-S) 8.6/50 mg tablet Take 1 Tab by mouth twice daily. (Patient taking differently: Take 1 tablet by mouth daily as needed.)   ??? sildenafil(+) (VIAGRA) 50 mg tablet Take 1 tablet by mouth as Needed for Erectile dysfunction.   ??? spironolactone (ALDACTONE) 25 mg tablet Take 1 tablet by mouth twice daily.     Vitals:    11/21/16 1610  BP: 116/68   Pulse: 107   Resp: 24   Temp: 36.6 ???C (97.8 ???F)   TempSrc: Oral   SpO2: 95%   Weight: 133.4 kg (294 lb)   Height: 180.3 cm (71)     Body mass index is 41 kg/m???.     BP Readings from Last 5 Encounters:   11/23/16 139/66   11/21/16 116/68   11/16/16 114/51   10/18/16 134/59   10/11/16 120/68     Wt Readings from Last 5 Encounters:   11/23/16 135.2 kg (298 lb)   11/21/16 133.4 kg (294 lb)   11/16/16 (!) 136.8 kg (301 lb 9.6 oz)   10/11/16 136.1 kg (300 lb)   10/06/16 136.1 kg (300 lb)         Physical Exam   Constitutional: He is oriented to person, place, and time. He appears well-developed and well-nourished.   HENT:   Head: Normocephalic and atraumatic.   Eyes: Conjunctivae and EOM are normal.   Neck: Neck supple.   Cardiovascular: Regular rhythm.  Tachycardia present.    Pulmonary/Chest: Effort normal. Tachypnea noted. No respiratory distress. He has no wheezes. He has rales in the right lower field.   Musculoskeletal: He exhibits edema. Neurological: He is alert and oriented to person, place, and time.   Skin: Skin is warm and dry. No rash noted. No pallor.   Psychiatric: He has a normal mood and affect. His behavior is normal. Judgment and thought content normal.   Nursing note and vitals reviewed.         Assessment and Plan:    Hospital discharge follow up:  I reviewed her hospitalization, progress notes, labs, imaging, discharge summary and performed medication reconciliation.   Patient is tachycardic and tachypneic today in clinic.  He continues to have a significant cough and shortness of breath.  Walking oximetry testing was stable at 95-96%.  Offered patient admission and he declined.  Discussed with primary care provider and together  we have developed a close follow-up plan for patient to return to clinic on Friday to ensure he is improving.  We will obtain CBC, CMP, BNP and chest x-ray today- if concerning test results will encouraged admission instead of close follow up plan.  Encouraged patient to continue his prednisone taper as directed upon hospital discharge. He will continue novolog SSI with meals for management of blood sugars while taking prednisone.  He should continue Symbicort and albuterol for management of respiratory illness.   He also has follow-up with cardiology on Thursday which will be beneficial.  Continue all heart failure medications.   I have ordered outpatient physical therapy to help patient regain his strength after illness.  He is deconditioned and weak.    Oral thrush:  Nystatin swish and swallow QID x 7 days    Blood blister on toe:  Encouraged patient not to pop the blood blister due to concern for infection. It may spontaneously pop or the body will reabsorb the blood    Return to see Dr. Crecencio Mc on Friday, sooner PRN. Discussed if his breathing becomes more labored over the next few days and he cannot be seen in clinic he needs to present to ED. Patient verbalized understanding. Orders Placed This Encounter   ??? CHEST 2 VIEWS   ??? CBC AND DIFF today   ??? COMPREHENSIVE METABOLIC PANEL today   ??? BNP (B-TYPE NATRIURETIC PEPTI)   ??? PHYSICAL THERAPY   ??? nystatin (MYCOSTATIN) 100,000 units/mL oral suspension  Future Appointments  Date Time Provider Department Center   11/24/2016 10:20 AM Comfort, [name], MD [facility]   11/29/2016 1:00 PM Lady Saucier, MD [facility] IM   12/05/2016 8:00 PM [facility]IT[facility]ING [facility] Remote   12/07/2016 9:40 AM Vena Austria, MD [facility] MAC [place]   12/13/2016 11:00 AM Dorthula Matas, MD [facility]   12/28/2016 10:00 AM Blanca Friend, MD [facility]ALL[facility] IM   01/09/2017 8:00 PM [facility] REMOTE MONITORING [facility]CREMOTEHRM [facility] Remote   01/16/2017 3:30 PM Lady Saucier, MD [facility] IM   02/14/2017 8:00 PM [facility] REMOTE MONITORING MACREMOTEHRM MAC Remote   03/20/2017 8:00 PM [facility] REMOTE MONITORING MACREMOTEHRM MAC Remote   04/24/2017 8:00 PM [facility] REMOTE MONITORING MACREMOTEHRM MAC Remote   05/29/2017 8:00 PM [facility] REMOTE MONITORING MACREMOTEHRM MAC Remote   07/03/2017 8:00 PM [facility] REMOTE MONITORING MACREMOTEHRM MAC Remote   08/07/2017 8:00 PM [facility] REMOTE MONITORING MACREMOTEHRM MAC Remote   09/11/2017 8:00 PM [facility] REMOTE MONITORING MACREMOTEHRM MAC Remote     Patient Instructions   It was nice to see you today. Thank you for coming into clinic.    You should get labs and xray today  Return to see Dr. Crecencio Mc on Friday    Routine Clinic Information:  Please don't hesitate to call if you have any problems or questions. My nurse is Florentina Addison and she can be reached at [phone].  If she does not answer, please leave a voicemail as she is probably rooming other patients.You may also message [place] in MyChart.    For refills on medications, please have your pharmacy fax a refill authorization request form to our office at Fax) [phone]. Please allow at least 3 business days for refill requests. For urgent issues after business hours/weekends/holidays call [phone] and request for the outpatient internal medicine physician to be paged    We offer same day appointments for your acute health concerns. These appointments are on a first come, first serve basis. I am available for these appointments daily- just ask for [facility] if you would like to be seen. Please call [phone] if you would like to make an appointment.     Take care,     Lowanda Cashaw and [employer], APRN-NP

## 2016-11-23 ENCOUNTER — Ambulatory Visit: Admit: 2016-11-23 | Discharge: 2016-11-23 | Payer: MEDICARE

## 2016-11-23 ENCOUNTER — Encounter: Admit: 2016-11-23 | Discharge: 2016-11-23 | Payer: MEDICARE

## 2016-11-23 DIAGNOSIS — I2510 Atherosclerotic heart disease of native coronary artery without angina pectoris: Secondary | ICD-10-CM

## 2016-11-23 DIAGNOSIS — M961 Postlaminectomy syndrome, not elsewhere classified: ICD-10-CM

## 2016-11-23 DIAGNOSIS — I429 Cardiomyopathy, unspecified: ICD-10-CM

## 2016-11-23 DIAGNOSIS — G4733 Obstructive sleep apnea (adult) (pediatric): ICD-10-CM

## 2016-11-23 DIAGNOSIS — N183 Chronic kidney disease, stage 3 (moderate): ICD-10-CM

## 2016-11-23 DIAGNOSIS — E785 Hyperlipidemia, unspecified: ICD-10-CM

## 2016-11-23 DIAGNOSIS — R0600 Dyspnea, unspecified: ICD-10-CM

## 2016-11-23 DIAGNOSIS — R609 Edema, unspecified: ICD-10-CM

## 2016-11-23 DIAGNOSIS — I714 Abdominal aortic aneurysm, without rupture: ICD-10-CM

## 2016-11-23 DIAGNOSIS — I5042 Chronic combined systolic (congestive) and diastolic (congestive) heart failure: Principal | ICD-10-CM

## 2016-11-23 DIAGNOSIS — K219 Gastro-esophageal reflux disease without esophagitis: ICD-10-CM

## 2016-11-23 DIAGNOSIS — L03116 Cellulitis of left lower limb: ICD-10-CM

## 2016-11-23 DIAGNOSIS — J189 Pneumonia, unspecified organism: ICD-10-CM

## 2016-11-23 DIAGNOSIS — Z8679 Personal history of other diseases of the circulatory system: ICD-10-CM

## 2016-11-23 DIAGNOSIS — G4730 Sleep apnea, unspecified: ICD-10-CM

## 2016-11-23 DIAGNOSIS — E669 Obesity, unspecified: ICD-10-CM

## 2016-11-23 DIAGNOSIS — E782 Mixed hyperlipidemia: ICD-10-CM

## 2016-11-23 DIAGNOSIS — J479 Bronchiectasis, uncomplicated: ICD-10-CM

## 2016-11-23 DIAGNOSIS — Z8614 Personal history of Methicillin resistant Staphylococcus aureus infection: ICD-10-CM

## 2016-11-23 DIAGNOSIS — I509 Heart failure, unspecified: ICD-10-CM

## 2016-11-23 DIAGNOSIS — J302 Other seasonal allergic rhinitis: ICD-10-CM

## 2016-11-23 DIAGNOSIS — J45909 Unspecified asthma, uncomplicated: ICD-10-CM

## 2016-11-23 DIAGNOSIS — M954 Acquired deformity of chest and rib: ICD-10-CM

## 2016-11-23 DIAGNOSIS — J449 Chronic obstructive pulmonary disease, unspecified: ICD-10-CM

## 2016-11-23 DIAGNOSIS — Z95828 Presence of other vascular implants and grafts: ICD-10-CM

## 2016-11-23 DIAGNOSIS — Z9981 Dependence on supplemental oxygen: ICD-10-CM

## 2016-11-23 DIAGNOSIS — I10 Essential (primary) hypertension: Secondary | ICD-10-CM

## 2016-11-23 DIAGNOSIS — R05 Cough: ICD-10-CM

## 2016-11-23 LAB — BASIC METABOLIC PANEL
Lab: 1.3 mg/dL — ABNORMAL HIGH (ref 0.4–1.24)
Lab: 12 (ref 3–12)
Lab: 136 MMOL/L — ABNORMAL LOW (ref 137–147)
Lab: 27 MMOL/L (ref 21–30)
Lab: 372 mg/dL — ABNORMAL HIGH (ref 70–100)
Lab: 4.5 MMOL/L (ref 3.5–5.1)
Lab: 51 mL/min — ABNORMAL LOW (ref >60)
Lab: 59 mg/dL — ABNORMAL HIGH (ref 7–25)
Lab: 9.3 mg/dL (ref 8.5–10.6)
Lab: 97 MMOL/L — ABNORMAL LOW (ref 98–110)
Lab: >60 mL/min (ref >60)

## 2016-11-23 NOTE — Progress Notes
Date of Service: 11/23/2016    Ruben Brittan. is a 76 y.o. male.       HPI     Ruben Wood presents to the heart failure clinic for 10-day post hospital follow-up.  He is accompanied by his wife.     64 y/o M???w/ PMH sig for depression, chronic pain, asthma, CKD, HTN, HLD, CAD, ischemic cardiomyopathy s/p pacer, ICD, CardioMEMS implanted on 03/30/15, obstructive sleep apnea, and chronic venous stasis.  He had also previously been following with Dr. Vita Barley in the weight reduction clinic but since not following the diet has regained much of the weight.     He was admitted to the [facility] from 11/07/16 through 11/16/16 under [facility] service due to dyspnea.  Initially, he presented to [facility] with acute hypoxic respiratory failure and was placed on BiPAP prior to transfer to [facility].  Of note, prior to that on 11/02/2016, he reported persistent cough with associated dyspnea and was treated with Levaquin and steroid as outpatient.  During this hospitalization, he was positive for RVP rhinovirus and his sputum Gram Stain showed many gram-positive cocci resembling strep (despite being on Levaquin).  He was treated with IV antibiotics and was later switched to Augmentin to complete 7-day therapy.  He was also treated with IV steroids.  He was noted to have decompensated heart failure, hence cardiology was consulted.  He was treated with IV diuretics  From 9/18-9/20 and then was transitioned to PO Bumex 3 mg BID that was reduced to Bumex 2 mg po BID on 11/14/16.  He was noted to have lower extremity swelling and discoloration which was evaluated by vascular surgery and noted to not have any concerns for arterial insufficiency.  Per chart review, his weight on admission was 300.7 lbs and 301.6 lbs on discharge.    Today, patient presents for followup.  He reports overall he is doing better as compared to the admission, however continues to feel weak and has shortness of breathwith exertion and chest congestion.  He was seen by a internal medicine nurse practitioner, Ms. Caffrey Dudenhoeffer, on 11/21/16 and was instructed to have labs and a chest x-ray done, with plans to return on Friday to see Dr. Crecencio Mc for follow-up.  He denies PND, abdominal fullness, edema, chest pain, palpitation, dizziness, and syncope.??? Clinic weight today is 298 lbs (was 301 lbs on 11/16/16 discharge).      I did review his online transmission for CardioMEMS PA sensor monitor and his PA diastolic pressure has been ranging from 7-10 mmHg and was 9 mmHg this morning.     BMP today showed sodium 136, potassium 4.5, chloride 97, bicarb 27, BUN 59, creatinine 1.36 (from 1.52 on 11/21/16).        Vitals:    11/23/16 1342   BP: 139/66   Pulse: 69   SpO2: 96%   Weight: 135.2 kg (298 lb)   Height: 1.803 m (5' 11)     Body mass index is 41.56 kg/m???.     Past Medical History  Patient Active Problem List    Diagnosis Date Noted   ??? Rhinovirus infection 11/09/2016   ??? Acute respiratory failure with hypoxia (HCC) 11/07/2016   ??? Severe sepsis (HCC) 11/07/2016   ??? Lactic acid acidosis 11/07/2016   ??? Hyperglycemia 11/07/2016   ??? Lumbar post-laminectomy syndrome      L4-5     ??? Iron deficiency 10/09/2016   ??? Spinal stenosis of  lumbosacral region 09/20/2016   ??? Spondylolisthesis of lumbosacral region 09/20/2016   ??? Lumbar radiculopathy 09/20/2016   ??? Osteoarthritis of spine with radiculopathy, lumbosacral region 09/20/2016   ??? Venous ulcer of left leg (HCC) 09/13/2016   ??? 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 Lower extremity edema 07/13/2016   ??? 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 He had a normal response to pneumococca vaccination; he had 17/23 positive serotypes. He has normal tetanus toxoid Ab and CH50.  His T&B cell panel revealed a mildly low B-cell count at 68 right after a time of acute illness.  He had positive diphtheria antibody.    - We recommend repeating T&B cell panel  - We recommend repeating IgG.     ??? Moderate persistent asthma with acute exacerbation 02/25/2016     He has had asthma since sometime in his 20-30s. He gets cough, chest tightness, wheezing, shortness of breath that is year round with worsening in the Spring and Fall. Triggers: URI, hot/cold air. He has a history of colds dropping to the chest. He is currently on Symbicort 160/4.56mcg 2 puff BID but he has lost his spacer. He has had ~5 exacerbations in 2017 and 1 steroid course this year already. Additionally he has been diagnosed with bronchiectasis and emphysema; his PFTs suggest both obstructive and restrictive disease and asthma is unlikely to be the sole cause of his dyspnea.    He has negative IgE immunocaps to aeroallergens which makes the possibility of allergic asthma highly unlikely. His IgE level was 17 and he had negative ANCAs, MPO and PR3.     He has taken 2 days of steroids (prednisone 40mg ), however expired medication. He reports this has improved his shortness of breath.     - We recommend completing 5 day course of prednisone and will prescribe 3 additional days of prednisone 40mg .   - We recommend adhering to Symbicort 160/4.91mcg 2 puff BID with Spacer. He is currently non-compliant with these medications.   - We recommend re-scheduling appointment with Dr. [name] for continued management.   - We will obtain records from Dr. Jasmine Awe office (Pulmonologist in [place], [place]) on whether the patient truly received Xolair.      ??? 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 He is not having issues with urticaria.    - We recommend IgE immunocaps to aeroallergens.      ??? Chronic Rhinoconjunctivitis 02/25/2016     He has had nasal congestion, rhinorrhea, PND, sneezing, itchy/watery eyes since he was a child that is year round and not worsening in seasons. Nasal sprays are not helpful (Flonase, Nasacort) and patient had incorrect technique with sprays with resultant nosebleeds. He takes Zyrtec 10mg  daily and Singulair 10mg  daily. He has had prior IT with Dr. [name] in [place], in [place], and with ENT physician Dr. Ander [name] in [place], [place]. He only found first round of IT helpful. He had a normal CT sinuses 02/2016 and negative IgE immunocaps for aeroallergens 02/2016.     His symptoms are currently uncontrolled. He found many nasal corticosteroids unhelpful including Flonase, Rhinocort, Nasacort. He is currently off of Zyrtec.  He has been noncompliant with his medication regimen and has not been taking the flunisolide nasal spray.    - We recommend starting flunisolide 1 sprays in each nostril once daily. We reviewed  technique in clinic.   - We recommend continuing Zyrtec 10mg  daily and continuing singulair 10mg  daily. His most recent eGFR is 69. If this changes in the future, his anti-histamine dose may need to be adjusted.     ??? 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 Etiologies include likely atopic disease, but other considerations include vasculitidies such as EGPA. Less likely parasitic infection as no bothersome GI symptoms or serpiginous rashes.    - We recommend repeating CBC w/ diff.   - We will obtain ANCAs     ??? Recurrent infections 02/25/2016     He has had 5-10 rounds of antibiotics per year for sinus infections and bronchitis. He was previously having recurrent pneumonias (3-4 times a year for 5 years) however not as of recently. Additionally he has had cellulitis in LE requiring hospitalization and IV Abx. Although these infections could be explained by underlying disease processes (obstructive/restrictive lung diseases and venous stasis requiring vein stripping), he meets the warning signs from [facility] 10 signs of PIDD. He had mildly low IgG at 636 with normal IgA and IgM. He had positive tetanus toxoid Ab and normal CH50.  He had a normal response to Pneumovax with 17/23+ pneumococcal serotypes.  He had a positive diphtheria antibody.    He received Pneumovax in clinic 04/2016 . He received Prevnar in 2016.     - We recommend repeating T&B cell panel  - We recommend repeating IgG.     ??? Postoperative visit 02/23/2016   ??? S/P left pulmonary artery pressure sensor implant placement (CardioMEMs)  02/02/2016     * Patient has CardioMEMs (Pulmonary Artery Pressure Sensor)* Please call Matthew Folks, CardioMEMs Program Coordinator [phone] or page Heart Failure Rounding Team if patient is admitted or presents to ED*      ??? Ischemic cardiomyopathy 01/12/2016   ??? 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 Wound of skin 01/12/2016   ??? 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 Varicose veins of left lower extremity with pain 01/05/2016   ??? 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 Obesity, morbid (HCC) 11/25/2015   ??? Varicose veins of leg with pain, left 11/25/2015   ??? Pain of left lower extremity 11/25/2015   ??? Hypercholesterolemia 11/25/2015   ??? Obesity (BMI 35.0-39.9 without comorbidity) 11/25/2015   ??? 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 Bronchiectasis without complication (HCC) 06/18/2015     -flutter  -non sputum producer     ??? Chronic obstructive pulmonary disease (HCC) 03/10/2015   ??? Abnormal cortisol level 12/25/2014   ??? Venous stasis dermatitis of both lower extremities 11/04/2014   ??? Sacroiliac dysfunction 07/28/2014 ??? 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 Chronic combined systolic and diastolic congestive heart failure, NYHA class 2 (HCC) 07/19/2014     11/04/14: EF 20%, severely dilated LV with grade 1 diastolic dysfunction.  11/12/2014 In clinic today, patient appears fairly well compensated.  He does have some LEE, but no significant rales (just some at bases), no JVD sitting upright, and no sx to suggest  decompensation.  We will continue metoprolol, spironolactone, and bumex at current doses.    11/17/2014 Edema much improved in lower ext, Cr trending down on last labs.  Will recheck labs today.  Advised to weigh himself daily.  Rechecking BMP today to ensure not overdoing diuretics.     On metoprolol, spironolactone.  Will need to explore allergy to ARB more as patient may benefit from ACEI.      03/29/14 CardioMEMS implant by Dr. Chales Abrahams  1. Normal cardiac output and cardiac index.  2. Normal pulmonary pressures and normal pulmonary capillary wedge pressure.  3. Successful insertion of CardioMEMS pulmonary artery pressure sensor     ??? Cardiomyopathy (HCC) 07/19/2014       CardioMEMS Primary Screening                                  Date: 02/23/2015        Heart failure admission within last 12 months                  Date: 11/03/14 Yes        NYHA HF Class III Yes        Able to take dual antiplatelet or anticoagulants for 1 month post implant Yes   Additional Screening if all above are yes        Patient with active infection No        History of recurrent PE or DVT No        Unable to tolerate RHC No        GFR < 25 mL/min and non-responsive to diuretics or on chronic renal dialysis No        Congenital heart disease or mechanical right heart valve No        CRT device implanted within last 3 months?          Date of Implant:  No        BMI > 35, axillary chest circumference > 165 cm                       140 cm  No        Unwilling to transmit device date or concerns with patient compliance No        ??? 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      ??? CKD (chronic kidney disease) stage 3, GFR 30-59 ml/min (HCC) 08/26/2013   ??? Mixed dyslipidemia 08/26/2013     Hepatic Function    Lab Results   Component Value Date/Time    ALBUMIN 4.0 11/04/2014 12:26 AM    TOTAL PROTEIN 6.9 11/04/2014 12:26 AM    ALK PHOSPHATASE 61 11/04/2014 12:26 AM    Lab Results   Component Value Date/Time    AST (SGOT) 41* 11/04/2014 12:26 AM    ALT (SGPT) 19 11/04/2014 12:26 AM    TOTAL BILIRUBIN 1.4* 11/04/2014 12:26 AM        Lab Results   Component Value Date    CHOL  148 12/15/2013    TRIG 122 12/15/2013    HDL 51 12/15/2013    LDL 88 12/15/2013    VLDL 24 12/15/2013    NONHDLCHOL 97 12/15/2013       BP Readings from Last 3 Encounters:   11/12/14 129/80   11/09/14 111/44   11/04/14 146/65     Plan:   76 y.o. with known CAD with CTO of RCA and obtuse marginal branch with high grade stenosis (90%) in mid left circ.    Advised heart healthy diet and daily exercise.    Continue high intensity statin, atorvastatin       ??? AAA (abdominal aortic aneurysm) (HCC) 10/15/2012     Duplex done at OSH in 2007 and 2008 ~ 4.1 cm    Duplex 10/15/12-AAA 7.3 cm AP x 7.3 cm       ??? History of MRSA infection 10/14/2012   ??? Chronic cough 08/29/2012   ??? CAD (coronary artery disease) 08/15/2012     07/11/2002- Cath @ [facility] showed Severe double vessel disease(OMB of circumflex and distal circ)  inferior-basilar dyskinesis.                  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    09/12/12   Cath - [place]:  CTO of the right coronary artery, CTO of the obtuse marginal branch and high-grade stenosis of   90% in the mid left circumflex artery No significant stenosis in the left anterior descending artery or left main vessel     Failed attempt in opening 2nd obtuse marginal CTO as described above.    10/14/12: Unsuccessful attempt to open CTO of OMB and mid-CFX by Dr. Mackey Birchwood         ??? OSA on CPAP 08/13/2012     CPAP at  DME: [facility]     ??? GERD (gastroesophageal reflux disease) 08/13/2012     -PPI  -follows with GI     ??? 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  Discussed the importance of diet.  Exercise as tolerated.        ??? Essential hypertension 08/13/2012   ??? Chest wall deformity 07/09/2012     Chest wall reconstruction on 07/09/12  Lung herniation- bio bridge           Review of Systems   Constitution: Positive for weakness. Negative for chills, fever and weight gain.   Cardiovascular: Positive for dyspnea on exertion. Negative for chest pain, leg swelling, palpitations and paroxysmal nocturnal dyspnea.   Respiratory: Positive for cough.    Hematologic/Lymphatic: Negative.  Negative for bleeding problem.   Musculoskeletal: Negative for muscle cramps.   Gastrointestinal: Negative for bloating and nausea.   Genitourinary: Negative for dysuria.   Neurological: Negative for dizziness and light-headedness.   Psychiatric/Behavioral: Negative for altered mental status.       Physical Exam  General Appearance: in no acute distress  Skin: warm, dry  Digits: no cyanosis or clubbing Eyes: conjunctivae and lids normal, pupils are equal and round, sclera non-icteric  Lips & Oral Mucosa: no pallor or cyanosis   Neck: JVP ~5 cm, -HJR   Pulmonology/Chest: coarse breath sounds bilaterally, no wheezing, cardiac device in left upper chest with well-healed scar   Cardiac: RRR, no rub or gallop noted, 2/6 sys murmur  GI: soft, non-tender, normal bowel sounds  Musculoskeletal:  No edema in RLE, trace edema in LLE  Neurological:  A&O x3, no focal deficits, seems to comprehend information     Cardiovascular Studies   11/23/2016 13:03   Sodium 136 (L)   Potassium 4.5   Chloride 97 (L)   CO2 27   Anion Gap 12   Blood Urea Nitrogen 59 (H)   Creatinine 1.36 (H)   eGFR Non African American 51 (L)   eGFR African American >60   Glucose 372 (H)   Calcium 9.3       Problems Addressed Today  Encounter Diagnoses   Name Primary?   ??? Chronic combined systolic and diastolic congestive heart failure, NYHA class 2 (HCC) Yes       Assessment and Plan     1. Chronic combined systolic and diastolic heart failure???with now improved EF of 50% on most recent echo 11/08/16 (previously???25% with grade 1 LV diastolic dysfunction, dilated LV at 6.0 cm, and moderate MR on echocardiogram 10/27/2015). ???He continues to have CardioMEMS PA sensor monitor which was placed 04/09/15. I did review his online transmission for CardioMEMS PA sensor monitor and his PA diastolic pressure has been ranging from 7-10 mmHg and was 9 mmHg this morning.  He denies any weight gain and appears euvolemic on exam.  Currently, he has NYHA class II-III, ACC stage C symptoms. ???He will continue with Bumex 2???mg BID, Spironolactone 25 mg BID, and metoprolol XL 25 mg daily. ???He has been off of losartan for several months due to hypotension and renal dysfunction. ???BMP today as above. ???Importance of daily weight monitoring, low-salt diet, and fluid restriction were discussed.  Patient is aware to give [place] a call if gain 3 lbs overnight or 5 lbs in a week.  See Dr. Jacqulyn Ducking on 12/07/16 as already scheduled.  Call us sooner if needed.    2. Recent Rhinovirus infection and sputum Gram Stain showing GPC resembling strep (despite being on Levaquin).  He was treated with BiPAP, IV antibiotics then PO Augmentin to complete 7-day therapy, as well as IV steroids and is currently on oral steroid taper.  He was seen by IM APP,  Ms. Caffrey Dudenhoeffer, on 11/21/16 and was instructed to have labs and a chest x-ray done, with plans to return on Friday 11/24/16 to see Dr. Crecencio Mc for follow-up.    3. CKD, stage III. Today's repeat BMP is above.  His creatinine is improved to 1.36 from previous result of 1.52 on 11/21/16.   4. Ischemic cardiomyopathy. ???He will continue metoprolol XL ???25 mg daily. He has been off???losartan due to???his renal function.   5. Status post CRT-D placement on 11/25/2015 by Dr. Betti Cruz. ???He continues to have regular schedule device interrogations. ???His last remote interrogation performed 11/21/16 revealed 1 MS event with an atrial burden of <1% that lasted 2 sec with a peak V-rate of 75 bpm that shows an AR - AP - BiVP pattern with PVC's causing MS. 1 PMT detect event that shows true PMT that was terminated with PMT algorithm. No ventricular events noted.   6. Morbid obesity. ???He had loss a significant amount of weight through the???[facility] directed by Dr. Vita Barley. ???  Unfortunately, he regained quite a bit of this weight after going off the program last November. ???He and his wife report that they will restart the program soon. ???I have also encouraged him to start back on his regular exercise.  7. Infrarenal abdominal aortic aneurysm, post endovascular repair, September of 2014.??? He follows with Dr. Chales Abrahams and was last seen in 07/27/16 with CT of the abdomen that same day reviewed by Dr. Chales Abrahams and revealed aneurysm is currently measuring 5.4 cm in maximum dimension, previously 5.8 cm.???He will see him back in 1 year with a repeat noncontrast CT of the abdomen and pelvis.      Thank you for involving me to participate in this patient's care.  Please call with questions and concerns.    Ronni Rumble, PA-C  CP:  Dr. Vanetta Shawl  [facility]  [phone]         Current Medications (including today's revisions)  ??? acetaminophen (TYLENOL) 500 mg tablet Take one tablet by mouth every 6 hours as needed for Pain. Max of 4,000 mg of acetaminophen in 24 hours.   ??? albuterol (PROAIR HFA, VENTOLIN HFA, OR PROVENTIL HFA) 90 mcg/actuation inhaler Inhale two puffs by mouth into the lungs every 4 hours as needed for Wheezing or Shortness of Breath.   ??? albuterol 0.083% (PROVENTIL; VENTOLIN) 2.5 mg /3 mL (0.083 %) nebulizer solution Inhale 3 mL solution by nebulizer as directed every 6 hours as needed for Wheezing or Shortness of Breath.   ??? allopurinol (ZYLOPRIM) 300 mg tablet Take one tablet by mouth daily.   ??? AMITRIPTYLINE HCL (AMITRIPTYLINE/GABAPENTIN/EMU OIL(#)) 05/24/08 % Apply 5 g topically to affected area three times daily.   ??? ascorbic acid (VITAMIN C) 500 mg tablet Take one tablet by mouth daily.   ??? aspirin 325 mg tablet Take 325 mg by mouth daily. Take with food.   ??? atorvastatin (LIPITOR) 20 mg tablet TAKE ONE TABLET BY MOUTH ONCE DAILY   ??? blood sugar diagnostic (GLUCOCARD EXPRESSION) test strip Use one strip as directed before meals and at bedtime.   ??? blood-glucose meter (ACCU-CHEK AVIVA PLUS METER) kit Use  as directed.   ??? budesonide/formoterol (SYMBICORT HFA) 160/4.5 mcg inhalation Inhale two puffs by mouth into the lungs twice daily. Rinse mouth after use.   ??? bumetanide (BUMEX) 2 mg tablet Take one tablet by mouth twice daily.   ??? cetirizine (ZYRTEC) 10 mg tablet Take 10 mg by mouth every morning.   ??? cholecalciferol (VITAMIN D-3) 1,000 units tablet Take 1,000 Units by mouth daily.   ??? codeine/guaiFENesin (ROBITUSSIN-AC) 10/100 mg/5 mL oral solution Take 5 mL by mouth every 4 hours as needed for Cough.   ??? duloxetine DR (CYMBALTA) 60 mg capsule Take one capsule by mouth daily.   ??? ferrous sulfate (FEOSOL, FEROSUL) 325 mg (65 mg iron) tablet Take one tablet by mouth daily. Take on empty stomach at least 1 hr before or 2 hrs after food.Take w/ vitamin C 500mg  for better absorption   ??? Flunisolide 25 mcg (0.025 %) spry Apply 2 sprays to each nostril as directed twice daily.   ??? gabapentin (NEURONTIN) 400 mg capsule Take one capsule by mouth every 8 hours.   ??? insulin aspart U-100 (NOVOLOG FLEXPEN U-100 INSULIN) 100 unit/mL injection PEN use sliding scale --max dose of 25 units per day   ??? insulin lispro(+) (HUMALOG KWIKPEN INSULIN) 100 unit/mL injection PEN see additional instructions section for insulin instructions   ??? insulin lispro(+) (HUMALOG KWIKPEN INSULIN)  100 unit/mL injection PEN Use sliding scale --max of 25 units per day   ??? insulin pen needles (disposable) (BD UF NANO PEN NEEDLES) 32 gauge x 5/32 pen needle Use one each as directed as Needed. Use with insulin injections.   ??? ipratropium bromide (ATROVENT) 42 mcg (0.06 %) nasal spray 1 spray to each nostril Q6H PRN   ??? L.ACID/L.CASEI/B.BIF/B.LON/FOS (PROBIOTIC BLEND PO) Take 1 Cap by mouth.   ??? lancets MISC Use one each as directed before meals and at bedtime. Diagnosis: steroid induced hyperglycemia   ??? lutein-zeaxanthin 25-5 mg cap Take 1 capsule by mouth daily.   ??? metoprolol XL (TOPROL XL) 25 mg extended release tablet Take 1 tablet by mouth daily.   ??? montelukast (SINGULAIR) 10 mg tablet TAKE ONE TABLET BY MOUTH AT BEDTIME. MAINTENANCE THERAPY FOR ASTHMA.   ??? nebulizer compressor medical device Use as directed.   ??? nitroglycerin (NITROSTAT) 0.4 mg tablet Place 1 Tab under tongue every 5 minutes as needed for Chest Pain.   ??? nystatin (MYCOSTATIN) 100,000 units/mL oral suspension Take 5 mL by mouth four times daily for 7 days.   ??? pantoprazole DR (PROTONIX) 40 mg tablet Take one tablet by mouth twice daily.   ??? prednisone (DELTASONE) 10 mg tablet Take 60mg  by mouth for 3 days. Starting 10/1 take 50mg  by mouth daily for 5 days. Starting 10/6 take 40mg  by mouth daily for 5 days, then on 10/11 take 30mg  by mouth daily for 5 days, then on 10/16 take 20mg  by mouth daily for 5 days, then 10/21 take 10mg  by mouth daily for 5 days then stop.   ??? ranitidine(+) (ZANTAC) 150 mg tablet TAKE ONE TABLET BY MOUTH ONCE DAILY   ??? senna/docusate (SENOKOT-S) 8.6/50 mg tablet Take 1 Tab by mouth twice daily. (Patient taking differently: Take 1 tablet by mouth daily as needed.)   ??? sildenafil(+) (VIAGRA) 50 mg tablet Take 1 tablet by mouth as Needed for Erectile dysfunction.   ??? spironolactone (ALDACTONE) 25 mg tablet Take 1 tablet by mouth twice daily.

## 2016-11-24 ENCOUNTER — Encounter: Admit: 2016-11-24 | Discharge: 2016-11-24 | Payer: MEDICARE

## 2016-11-24 ENCOUNTER — Ambulatory Visit: Admit: 2016-11-24 | Discharge: 2016-11-24 | Payer: MEDICARE

## 2016-11-24 DIAGNOSIS — I10 Essential (primary) hypertension: Principal | ICD-10-CM

## 2016-11-24 DIAGNOSIS — J45909 Unspecified asthma, uncomplicated: ICD-10-CM

## 2016-11-24 DIAGNOSIS — J302 Other seasonal allergic rhinitis: ICD-10-CM

## 2016-11-24 DIAGNOSIS — I5042 Chronic combined systolic (congestive) and diastolic (congestive) heart failure: Principal | ICD-10-CM

## 2016-11-24 DIAGNOSIS — L03116 Cellulitis of left lower limb: ICD-10-CM

## 2016-11-24 DIAGNOSIS — I509 Heart failure, unspecified: ICD-10-CM

## 2016-11-24 DIAGNOSIS — M961 Postlaminectomy syndrome, not elsewhere classified: ICD-10-CM

## 2016-11-24 DIAGNOSIS — R609 Edema, unspecified: ICD-10-CM

## 2016-11-24 DIAGNOSIS — K219 Gastro-esophageal reflux disease without esophagitis: ICD-10-CM

## 2016-11-24 DIAGNOSIS — Z8679 Personal history of other diseases of the circulatory system: ICD-10-CM

## 2016-11-24 DIAGNOSIS — R05 Cough: ICD-10-CM

## 2016-11-24 DIAGNOSIS — G4733 Obstructive sleep apnea (adult) (pediatric): ICD-10-CM

## 2016-11-24 DIAGNOSIS — G4730 Sleep apnea, unspecified: ICD-10-CM

## 2016-11-24 DIAGNOSIS — Z9981 Dependence on supplemental oxygen: ICD-10-CM

## 2016-11-24 DIAGNOSIS — I2510 Atherosclerotic heart disease of native coronary artery without angina pectoris: Secondary | ICD-10-CM

## 2016-11-24 DIAGNOSIS — J449 Chronic obstructive pulmonary disease, unspecified: ICD-10-CM

## 2016-11-24 DIAGNOSIS — M954 Acquired deformity of chest and rib: ICD-10-CM

## 2016-11-24 DIAGNOSIS — I429 Cardiomyopathy, unspecified: ICD-10-CM

## 2016-11-24 DIAGNOSIS — E785 Hyperlipidemia, unspecified: ICD-10-CM

## 2016-11-24 DIAGNOSIS — I714 Abdominal aortic aneurysm, without rupture: ICD-10-CM

## 2016-11-24 DIAGNOSIS — R0602 Shortness of breath: ICD-10-CM

## 2016-11-24 DIAGNOSIS — R5383 Other fatigue: ICD-10-CM

## 2016-11-24 DIAGNOSIS — Z8614 Personal history of Methicillin resistant Staphylococcus aureus infection: ICD-10-CM

## 2016-11-24 DIAGNOSIS — E782 Mixed hyperlipidemia: ICD-10-CM

## 2016-11-24 DIAGNOSIS — J479 Bronchiectasis, uncomplicated: ICD-10-CM

## 2016-11-24 DIAGNOSIS — N183 Chronic kidney disease, stage 3 (moderate): ICD-10-CM

## 2016-11-24 DIAGNOSIS — J189 Pneumonia, unspecified organism: ICD-10-CM

## 2016-11-24 DIAGNOSIS — Z95828 Presence of other vascular implants and grafts: ICD-10-CM

## 2016-11-24 DIAGNOSIS — R0600 Dyspnea, unspecified: ICD-10-CM

## 2016-11-24 DIAGNOSIS — E669 Obesity, unspecified: ICD-10-CM

## 2016-11-24 NOTE — Progress Notes
History of Present Illness  Ruben Wood. is a 76 y.o. male with multiple medical problems including CAD, Systolic and Diastolic HF, HLD, OSA on CPAP, morbid obesity, chronic pain, and COPD who presents today for continued SOB and fatigue.     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  He was seen three days ago with Ruben Wood and this was about one week post discharge and he was continuing to have SOB and fatigue.  We did labs and CXR and had him come back for quick follow up.     Today, he reports he is doing a little better.  Still having lots of SOB, but improving.  He did PT yesterday and tolerated this well overall. He still has lots of mucous production.  His fatigue is still prominent, but he was able to get out of the house yesterday.  He denies orthopnea, PND, or increased LEE.  He denies F/C.         Review of Systems   Constitutional: Positive for fatigue. Negative for chills and fever.   HENT: Positive for congestion.    Respiratory: Positive for cough and shortness of breath. Negative for chest tightness and wheezing.    Cardiovascular: Negative for chest pain and leg swelling.       Objective:         ??? acetaminophen (TYLENOL) 500 mg tablet Take one tablet by mouth every 6 hours as needed for Pain. Max of 4,000 mg of acetaminophen in 24 hours.   ??? albuterol (PROAIR HFA, VENTOLIN HFA, OR PROVENTIL HFA) 90 mcg/actuation inhaler Inhale two puffs by mouth into the lungs every 4 hours as needed for Wheezing or Shortness of Breath.   ??? albuterol 0.083% (PROVENTIL; VENTOLIN) 2.5 mg /3 mL (0.083 %) nebulizer solution Inhale 3 mL solution by nebulizer as directed every 6 hours as needed for Wheezing or Shortness of Breath.   ??? allopurinol (ZYLOPRIM) 300 mg tablet Take one tablet by mouth daily.   ??? AMITRIPTYLINE HCL (AMITRIPTYLINE/GABAPENTIN/EMU OIL(#)) 05/24/08 % Apply 5 g topically to affected area three times daily. ??? ascorbic acid (VITAMIN C) 500 mg tablet Take one tablet by mouth daily.   ??? aspirin 325 mg tablet Take 325 mg by mouth daily. Take with food.   ??? atorvastatin (LIPITOR) 20 mg tablet TAKE ONE TABLET BY MOUTH ONCE DAILY   ??? blood sugar diagnostic (GLUCOCARD EXPRESSION) test strip Use one strip as directed before meals and at bedtime.   ??? blood-glucose meter (ACCU-CHEK AVIVA PLUS METER) kit Use  as directed.   ??? budesonide/formoterol (SYMBICORT HFA) 160/4.5 mcg inhalation Inhale two puffs by mouth into the lungs twice daily. Rinse mouth after use.   ??? bumetanide (BUMEX) 2 mg tablet Take one tablet by mouth twice daily.   ??? cetirizine (ZYRTEC) 10 mg tablet Take 10 mg by mouth every morning.   ??? cholecalciferol (VITAMIN D-3) 1,000 units tablet Take 1,000 Units by mouth daily.   ??? codeine/guaiFENesin (ROBITUSSIN-AC) 10/100 mg/5 mL oral solution Take 5 mL by mouth every 4 hours as needed for Cough.   ??? duloxetine DR (CYMBALTA) 60 mg capsule Take one capsule by mouth daily.   ??? ferrous sulfate (FEOSOL, FEROSUL) 325 mg (65 mg iron) tablet Take one tablet by mouth daily. Take on empty stomach at least 1 hr before or 2 hrs after food.Take w/ vitamin C 500mg  for better absorption   ??? Flunisolide 25 mcg (0.025 %)  spry Apply 2 sprays to each nostril as directed twice daily.   ??? gabapentin (NEURONTIN) 400 mg capsule Take one capsule by mouth every 8 hours.   ??? insulin aspart U-100 (NOVOLOG FLEXPEN U-100 INSULIN) 100 unit/mL injection PEN use sliding scale --max dose of 25 units per day   ??? insulin lispro(+) (HUMALOG KWIKPEN INSULIN) 100 unit/mL injection PEN see additional instructions section for insulin instructions   ??? insulin lispro(+) (HUMALOG KWIKPEN INSULIN) 100 unit/mL injection PEN Use sliding scale --max of 25 units per day   ??? insulin pen needles (disposable) (BD UF NANO PEN NEEDLES) 32 gauge x 5/32 pen needle Use one each as directed as Needed. Use with insulin injections. ??? ipratropium bromide (ATROVENT) 42 mcg (0.06 %) nasal spray 1 spray to each nostril Q6H PRN   ??? L.ACID/L.CASEI/B.BIF/B.LON/FOS (PROBIOTIC BLEND PO) Take 1 Cap by mouth.   ??? lancets MISC Use one each as directed before meals and at bedtime. Diagnosis: steroid induced hyperglycemia   ??? lutein-zeaxanthin 25-5 mg cap Take 1 capsule by mouth daily.   ??? metoprolol XL (TOPROL XL) 25 mg extended release tablet Take 1 tablet by mouth daily.   ??? montelukast (SINGULAIR) 10 mg tablet TAKE ONE TABLET BY MOUTH AT BEDTIME. MAINTENANCE THERAPY FOR ASTHMA.   ??? nebulizer compressor medical device Use as directed.   ??? nitroglycerin (NITROSTAT) 0.4 mg tablet Place 1 Tab under tongue every 5 minutes as needed for Chest Pain.   ??? nystatin (MYCOSTATIN) 100,000 units/mL oral suspension Take 5 mL by mouth four times daily for 7 days.   ??? pantoprazole DR (PROTONIX) 40 mg tablet Take one tablet by mouth twice daily.   ??? prednisone (DELTASONE) 10 mg tablet Take 60mg  by mouth for 3 days. Starting 10/1 take 50mg  by mouth daily for 5 days. Starting 10/6 take 40mg  by mouth daily for 5 days, then on 10/11 take 30mg  by mouth daily for 5 days, then on 10/16 take 20mg  by mouth daily for 5 days, then 10/21 take 10mg  by mouth daily for 5 days then stop.   ??? ranitidine(+) (ZANTAC) 150 mg tablet TAKE ONE TABLET BY MOUTH ONCE DAILY   ??? senna/docusate (SENOKOT-S) 8.6/50 mg tablet Take 1 Tab by mouth twice daily. (Patient taking differently: Take 1 tablet by mouth daily as needed.)   ??? sildenafil(+) (VIAGRA) 50 mg tablet Take 1 tablet by mouth as Needed for Erectile dysfunction.   ??? spironolactone (ALDACTONE) 25 mg tablet Take 1 tablet by mouth twice daily.     Vitals:    11/24/16 1051   BP: 128/84   Pulse: 102   Temp: 36.8 ???C (98.2 ???F)   SpO2: 96%       Body mass index is 40.47 kg/m???.     Wt Readings from Last 5 Encounters:   11/24/16 135.4 kg (298 lb 6.4 oz)   11/23/16 135.2 kg (298 lb)   11/21/16 133.4 kg (294 lb) 11/16/16 (!) 136.8 kg (301 lb 9.6 oz)   10/11/16 136.1 kg (300 lb)       Physical Exam   Constitutional: He appears well-developed and well-nourished. No distress.   HENT:   Head: Normocephalic and atraumatic.   Mouth/Throat: Oropharynx is clear and moist. No oropharyngeal exudate.   Eyes: Pupils are equal, round, and reactive to light. Conjunctivae and EOM are normal. Right eye exhibits no discharge. Left eye exhibits no discharge. No scleral icterus.   Neck: Neck supple. No JVD present. No thyromegaly present.   Cardiovascular: Normal rate,  regular rhythm and normal heart sounds.  Exam reveals no gallop and no friction rub.    No murmur heard.  Pulmonary/Chest: Effort normal. No respiratory distress. He has no wheezes. He has no rales.   No accessory muscle use, rhonchi bilaterally   Abdominal: Soft. Bowel sounds are normal. He exhibits no distension. There is no tenderness. There is no rebound and no guarding.   Musculoskeletal: He exhibits no edema.   Lymphadenopathy:     He has no cervical adenopathy.   Neurological: He is alert.   Skin: Skin is warm and dry. No rash noted. He is not diaphoretic. No erythema. No pallor.   Psychiatric: He has a normal mood and affect.   Nursing note and vitals reviewed.    Labs Reviewed:   Lab Results   Component Value Date/Time    HGBA1C 5.4 02/25/2016 11:50 AM    HGBA1C 6.2 (H) 03/01/2015 02:18 PM    HGBA1C 5.3 12/15/2014 08:46 AM    HGBPOC 11.2 (L) 10/25/2012 10:58 AM    HGBPOC 12.9 (L) 10/25/2012 08:55 AM    TSH 2.160 03/01/2015 02:18 PM    FREET4R 0.99 12/15/2014 09:12 AM    CHOL 124 02/25/2016 11:50 AM    TRIG 120 02/25/2016 11:50 AM    HDL 32 (L) 02/25/2016 11:50 AM    LDL 76 02/25/2016 11:50 AM    NA 136 (L) 11/23/2016 01:03 PM    K 4.5 11/23/2016 01:03 PM    CL 97 (L) 11/23/2016 01:03 PM    CO2 27 11/23/2016 01:03 PM    GAP 12 11/23/2016 01:03 PM    BUN 59 (H) 11/23/2016 01:03 PM    CR 1.36 (H) 11/23/2016 01:03 PM    GLU 372 (H) 11/23/2016 01:03 PM CA 9.3 11/23/2016 01:03 PM    PO4 3.5 11/15/2016 05:35 AM    ALBUMIN 4.0 11/21/2016 11:21 AM    TOTPROT 6.6 11/21/2016 11:21 AM    ALKPHOS 75 11/21/2016 11:21 AM    AST 19 11/21/2016 11:21 AM    ALT 32 11/21/2016 11:21 AM    TOTBILI 1.6 (H) 11/21/2016 11:21 AM    GFR 51 (L) 11/23/2016 01:03 PM    GFRAA >60 11/23/2016 01:03 PM         Assessment and Plan:Ruben A Heinrich Gerdes. is a 76 y.o. male with multiple medical problems including CAD, Systolic and Diastolic HF, HLD, OSA on CPAP, morbid obesity, chronic pain, and COPD who presents today for continued SOB and fatigue.     1. Chronic combined systolic and diastolic congestive heart failure, NYHA class 2 (HCC)    2. Bronchiectasis without complication (HCC)    3. Shortness of breath    4. Fatigue, unspecified type      I think Violet is improving overall, although slower than he would like.  I do not think he warrants any further antibiotics or steroids.  His CXR is clear.  I discussed the expected post hospital course.  I want him to continue PT.  I do not see any evidence of decompensated heart failure, so I want him to continue with his current meds.  He saw the HF clinic one day ago.  I expect his fatigue to improve slowly over the next few weeks.  He has poor reserve, so I think this is the cause of slow recovery.     RTC: 3 months for comprehensive follow up    CCM: Enrolled previously      Patient Instructions  Future Appointments  Date Time Provider Department Center   11/29/2016 1:00 PM Lady Saucier, MD [facility]   12/05/2016 8:00 PM [facility]   12/07/2016 9:40 AM Vena Austria, MD [facility] [place]   12/13/2016 11:00 AM Dorthula Matas, MD [facility]   12/28/2016 10:00 AM Blanca Friend, MD [facility]   01/09/2017 8:00 PM [facility] Remote   01/16/2017 3:30 PM Lady Saucier, MD [facility]   02/14/2017 8:00 PM [facility] 03/20/2017 8:00 PM [facility]   04/24/2017 8:00 PM [facility] Remote   05/29/2017 8:00 PM [facility]   07/03/2017 8:00 PM [facility]   08/07/2017 8:00 PM [facility] Remote   09/11/2017 8:00 PM [facility] Remote       General Instructions:  ??? How to reach me:?????? Please send a MyChart message to the General Medicine clinic or call Kim at [phone]. ???  ??? How to get a medication refill:??? Please use the MyChart Refill request or contact your pharmacy directly to request medication refills. Please allow 48 hours.??? ???  ??? How to receive your test results:??? If you have signed up for MyChart, you will receive your test results and messages from me this way.??? Otherwise, you will get a phone call or letter.?????? If you are expecting results and have not heard from my office within 2 weeks of your testing, please send a MyChart message or call my office.??? ???  ??? Scheduling:??? Our Scheduling phone number is [phone].??? Same Day appointments are available. Please ask for an annual or yearly physical appointment if it has been over 1 year since our last appointment.  ??? Appointment Reminders on your cell phone: Make sure we have your cell phone number, and Text Lorenzo to [phone].  ??? Support groups for many chronic illnesses are available through [facility]: [email or web address] or [phone].               Return in about 3 months (around 02/24/2017).

## 2016-11-26 ENCOUNTER — Encounter: Admit: 2016-11-26 | Discharge: 2016-11-26 | Payer: MEDICARE

## 2016-11-26 DIAGNOSIS — Z8614 Personal history of Methicillin resistant Staphylococcus aureus infection: ICD-10-CM

## 2016-11-26 DIAGNOSIS — I10 Essential (primary) hypertension: Secondary | ICD-10-CM

## 2016-11-26 DIAGNOSIS — N183 Chronic kidney disease, stage 3 (moderate): ICD-10-CM

## 2016-11-26 DIAGNOSIS — R0600 Dyspnea, unspecified: ICD-10-CM

## 2016-11-26 DIAGNOSIS — M961 Postlaminectomy syndrome, not elsewhere classified: ICD-10-CM

## 2016-11-26 DIAGNOSIS — Z8679 Personal history of other diseases of the circulatory system: ICD-10-CM

## 2016-11-26 DIAGNOSIS — J189 Pneumonia, unspecified organism: ICD-10-CM

## 2016-11-26 DIAGNOSIS — L03116 Cellulitis of left lower limb: ICD-10-CM

## 2016-11-26 DIAGNOSIS — I5042 Chronic combined systolic (congestive) and diastolic (congestive) heart failure: ICD-10-CM

## 2016-11-26 DIAGNOSIS — M954 Acquired deformity of chest and rib: ICD-10-CM

## 2016-11-26 DIAGNOSIS — I509 Heart failure, unspecified: ICD-10-CM

## 2016-11-26 DIAGNOSIS — G4733 Obstructive sleep apnea (adult) (pediatric): ICD-10-CM

## 2016-11-26 DIAGNOSIS — I714 Abdominal aortic aneurysm, without rupture: ICD-10-CM

## 2016-11-26 DIAGNOSIS — E669 Obesity, unspecified: ICD-10-CM

## 2016-11-26 DIAGNOSIS — I429 Cardiomyopathy, unspecified: ICD-10-CM

## 2016-11-26 DIAGNOSIS — J45909 Unspecified asthma, uncomplicated: ICD-10-CM

## 2016-11-26 DIAGNOSIS — J449 Chronic obstructive pulmonary disease, unspecified: ICD-10-CM

## 2016-11-26 DIAGNOSIS — J302 Other seasonal allergic rhinitis: ICD-10-CM

## 2016-11-26 DIAGNOSIS — E782 Mixed hyperlipidemia: ICD-10-CM

## 2016-11-26 DIAGNOSIS — I2510 Atherosclerotic heart disease of native coronary artery without angina pectoris: Secondary | ICD-10-CM

## 2016-11-26 DIAGNOSIS — Z95828 Presence of other vascular implants and grafts: ICD-10-CM

## 2016-11-26 DIAGNOSIS — G4730 Sleep apnea, unspecified: ICD-10-CM

## 2016-11-26 DIAGNOSIS — E785 Hyperlipidemia, unspecified: ICD-10-CM

## 2016-11-26 DIAGNOSIS — R609 Edema, unspecified: ICD-10-CM

## 2016-11-26 DIAGNOSIS — K219 Gastro-esophageal reflux disease without esophagitis: ICD-10-CM

## 2016-11-26 DIAGNOSIS — R05 Cough: ICD-10-CM

## 2016-11-26 DIAGNOSIS — Z9981 Dependence on supplemental oxygen: ICD-10-CM

## 2016-11-26 DIAGNOSIS — J479 Bronchiectasis, uncomplicated: ICD-10-CM

## 2016-11-29 ENCOUNTER — Encounter: Admit: 2016-11-29 | Discharge: 2016-11-29 | Payer: MEDICARE

## 2016-11-29 ENCOUNTER — Ambulatory Visit: Admit: 2016-11-29 | Discharge: 2016-11-29 | Payer: MEDICARE

## 2016-11-29 DIAGNOSIS — G4730 Sleep apnea, unspecified: ICD-10-CM

## 2016-11-29 DIAGNOSIS — E782 Mixed hyperlipidemia: ICD-10-CM

## 2016-11-29 DIAGNOSIS — K219 Gastro-esophageal reflux disease without esophagitis: ICD-10-CM

## 2016-11-29 DIAGNOSIS — J4541 Moderate persistent asthma with (acute) exacerbation: Principal | ICD-10-CM

## 2016-11-29 DIAGNOSIS — J439 Emphysema, unspecified: ICD-10-CM

## 2016-11-29 DIAGNOSIS — I2510 Atherosclerotic heart disease of native coronary artery without angina pectoris: Secondary | ICD-10-CM

## 2016-11-29 DIAGNOSIS — R3915 Urgency of urination: ICD-10-CM

## 2016-11-29 DIAGNOSIS — E785 Hyperlipidemia, unspecified: ICD-10-CM

## 2016-11-29 DIAGNOSIS — Z95828 Presence of other vascular implants and grafts: ICD-10-CM

## 2016-11-29 DIAGNOSIS — M954 Acquired deformity of chest and rib: ICD-10-CM

## 2016-11-29 DIAGNOSIS — E669 Obesity, unspecified: ICD-10-CM

## 2016-11-29 DIAGNOSIS — J479 Bronchiectasis, uncomplicated: ICD-10-CM

## 2016-11-29 DIAGNOSIS — I714 Abdominal aortic aneurysm, without rupture: ICD-10-CM

## 2016-11-29 DIAGNOSIS — I509 Heart failure, unspecified: ICD-10-CM

## 2016-11-29 DIAGNOSIS — J45909 Unspecified asthma, uncomplicated: ICD-10-CM

## 2016-11-29 DIAGNOSIS — Z8679 Personal history of other diseases of the circulatory system: ICD-10-CM

## 2016-11-29 DIAGNOSIS — L03116 Cellulitis of left lower limb: ICD-10-CM

## 2016-11-29 DIAGNOSIS — I5042 Chronic combined systolic (congestive) and diastolic (congestive) heart failure: ICD-10-CM

## 2016-11-29 DIAGNOSIS — G4733 Obstructive sleep apnea (adult) (pediatric): ICD-10-CM

## 2016-11-29 DIAGNOSIS — I10 Essential (primary) hypertension: Secondary | ICD-10-CM

## 2016-11-29 DIAGNOSIS — J449 Chronic obstructive pulmonary disease, unspecified: ICD-10-CM

## 2016-11-29 DIAGNOSIS — R05 Cough: ICD-10-CM

## 2016-11-29 DIAGNOSIS — N400 Enlarged prostate without lower urinary tract symptoms: ICD-10-CM

## 2016-11-29 DIAGNOSIS — Z9981 Dependence on supplemental oxygen: ICD-10-CM

## 2016-11-29 DIAGNOSIS — N183 Chronic kidney disease, stage 3 (moderate): ICD-10-CM

## 2016-11-29 DIAGNOSIS — R609 Edema, unspecified: ICD-10-CM

## 2016-11-29 DIAGNOSIS — M961 Postlaminectomy syndrome, not elsewhere classified: ICD-10-CM

## 2016-11-29 DIAGNOSIS — Z8614 Personal history of Methicillin resistant Staphylococcus aureus infection: ICD-10-CM

## 2016-11-29 DIAGNOSIS — J302 Other seasonal allergic rhinitis: ICD-10-CM

## 2016-11-29 DIAGNOSIS — R0600 Dyspnea, unspecified: ICD-10-CM

## 2016-11-29 DIAGNOSIS — J189 Pneumonia, unspecified organism: ICD-10-CM

## 2016-11-29 DIAGNOSIS — I429 Cardiomyopathy, unspecified: ICD-10-CM

## 2016-11-29 MED ORDER — BUDESONIDE-FORMOTEROL 160-4.5 MCG/ACTUATION IN HFAA
2 | Freq: Two times a day (BID) | RESPIRATORY_TRACT | 3 refills | 30.00000 days | Status: AC
Start: 2016-11-29 — End: 2017-10-02

## 2016-11-29 MED ORDER — MONTELUKAST 10 MG PO TAB
10 mg | ORAL_TABLET | Freq: Every evening | ORAL | 3 refills | 90.00000 days | Status: AC
Start: 2016-11-29 — End: 2017-10-02

## 2016-11-30 NOTE — Telephone Encounter
[facility] Case Management Note    Plan of Care:  No call placed to patient due to appointment today (11/29/16). NCM will attempt to reach patient at a later date. NCM to follow case 30 days post discharge.      Marcelline Deist, RN, BSN  [phone]

## 2016-12-03 ENCOUNTER — Encounter: Admit: 2016-12-03 | Discharge: 2016-12-03 | Payer: MEDICARE

## 2016-12-03 DIAGNOSIS — G4733 Obstructive sleep apnea (adult) (pediatric): ICD-10-CM

## 2016-12-03 DIAGNOSIS — J479 Bronchiectasis, uncomplicated: ICD-10-CM

## 2016-12-03 DIAGNOSIS — R609 Edema, unspecified: ICD-10-CM

## 2016-12-03 DIAGNOSIS — G4730 Sleep apnea, unspecified: ICD-10-CM

## 2016-12-03 DIAGNOSIS — Z9981 Dependence on supplemental oxygen: ICD-10-CM

## 2016-12-03 DIAGNOSIS — I714 Abdominal aortic aneurysm, without rupture: ICD-10-CM

## 2016-12-03 DIAGNOSIS — J189 Pneumonia, unspecified organism: ICD-10-CM

## 2016-12-03 DIAGNOSIS — M954 Acquired deformity of chest and rib: ICD-10-CM

## 2016-12-03 DIAGNOSIS — I429 Cardiomyopathy, unspecified: ICD-10-CM

## 2016-12-03 DIAGNOSIS — N183 Chronic kidney disease, stage 3 (moderate): ICD-10-CM

## 2016-12-03 DIAGNOSIS — J449 Chronic obstructive pulmonary disease, unspecified: ICD-10-CM

## 2016-12-03 DIAGNOSIS — Z8679 Personal history of other diseases of the circulatory system: ICD-10-CM

## 2016-12-03 DIAGNOSIS — K219 Gastro-esophageal reflux disease without esophagitis: ICD-10-CM

## 2016-12-03 DIAGNOSIS — E782 Mixed hyperlipidemia: ICD-10-CM

## 2016-12-03 DIAGNOSIS — Z8614 Personal history of Methicillin resistant Staphylococcus aureus infection: ICD-10-CM

## 2016-12-03 DIAGNOSIS — I509 Heart failure, unspecified: ICD-10-CM

## 2016-12-03 DIAGNOSIS — I5042 Chronic combined systolic (congestive) and diastolic (congestive) heart failure: ICD-10-CM

## 2016-12-03 DIAGNOSIS — J302 Other seasonal allergic rhinitis: ICD-10-CM

## 2016-12-03 DIAGNOSIS — I10 Essential (primary) hypertension: Secondary | ICD-10-CM

## 2016-12-03 DIAGNOSIS — Z95828 Presence of other vascular implants and grafts: ICD-10-CM

## 2016-12-03 DIAGNOSIS — I2510 Atherosclerotic heart disease of native coronary artery without angina pectoris: ICD-10-CM

## 2016-12-03 DIAGNOSIS — R0600 Dyspnea, unspecified: ICD-10-CM

## 2016-12-03 DIAGNOSIS — E669 Obesity, unspecified: ICD-10-CM

## 2016-12-03 DIAGNOSIS — J45909 Unspecified asthma, uncomplicated: ICD-10-CM

## 2016-12-03 DIAGNOSIS — M961 Postlaminectomy syndrome, not elsewhere classified: ICD-10-CM

## 2016-12-03 DIAGNOSIS — R05 Cough: ICD-10-CM

## 2016-12-03 DIAGNOSIS — E785 Hyperlipidemia, unspecified: ICD-10-CM

## 2016-12-03 DIAGNOSIS — L03116 Cellulitis of left lower limb: ICD-10-CM

## 2016-12-03 NOTE — Progress Notes
Date of Service: 11/29/2016    Subjective:             Ruben Wood. is a 76 y.o. male.    History of Present Illness     Pt seen in clinic for hospital follow-up.  Clinically doing much better.  Breathing is at baseline.  Feels extremely weak but is working with rehab to get stronger.  No active wheezing or chest tightness.  Minimal chest congestion.  Continues on prednisone taper.    Hospital discharge summary:  Presented 9/18 in transfer from [facility] w/ acute hypoxic???respiratory failure. Presented there 9/13 w/ persistent cough causing SOA &???was treated w/ levaquin &???steroid. On 9/18 had progressive increase in O2 requirement &???placed on BiPAP prior to transfer to [facility].   ???  Once admitted to [facility] workup was obtained which showed RVP +rhinovirus &???sputum gram stain w/???many GPC resembling strep (despite being on levaquin). ???He was started on IV antibiotics then???narrowed antbx to augmentin to complete 7d. He was also started on IV steroids as well. ???He was noted to also have decompensated heart failure hence cardiology was consulted and he was started on IV diuretics. ???Once he clinically improved he was deescalated to PTA dose of diuretics and taper of steroids. ???He was also weaned off of supplemental oxygen. ???He was discharged home with appointment follow ups with cardiology and pulmonology. ???  ???  He was noted to have LE swelling and discoloration which was evaluated by vascular surgery and not noted to have any concerns with arterial insufficiency.       Review of Systems   Constitutional: Positive for fatigue.        Weak   HENT: Negative.    Eyes: Negative.    Respiratory: Positive for cough and shortness of breath.    Cardiovascular: Negative.    Gastrointestinal: Negative.    Endocrine: Negative.    Genitourinary: Negative.    Musculoskeletal: Positive for arthralgias, back pain and gait problem.   Skin: Negative.    Allergic/Immunologic: Negative.    Hematological: Negative. Psychiatric/Behavioral: Negative.          Objective:         ??? acetaminophen (TYLENOL) 500 mg tablet Take one tablet by mouth every 6 hours as needed for Pain. Max of 4,000 mg of acetaminophen in 24 hours.   ??? albuterol (PROAIR HFA, VENTOLIN HFA, OR PROVENTIL HFA) 90 mcg/actuation inhaler Inhale two puffs by mouth into the lungs every 4 hours as needed for Wheezing or Shortness of Breath.   ??? albuterol 0.083% (PROVENTIL; VENTOLIN) 2.5 mg /3 mL (0.083 %) nebulizer solution Inhale 3 mL solution by nebulizer as directed every 6 hours as needed for Wheezing or Shortness of Breath.   ??? allopurinol (ZYLOPRIM) 300 mg tablet Take one tablet by mouth daily.   ??? AMITRIPTYLINE HCL (AMITRIPTYLINE/GABAPENTIN/EMU OIL(#)) 05/24/08 % Apply 5 g topically to affected area three times daily.   ??? ascorbic acid (VITAMIN C) 500 mg tablet Take one tablet by mouth daily.   ??? aspirin 325 mg tablet Take 325 mg by mouth daily. Take with food.   ??? atorvastatin (LIPITOR) 20 mg tablet TAKE ONE TABLET BY MOUTH ONCE DAILY   ??? blood sugar diagnostic (GLUCOCARD EXPRESSION) test strip Use one strip as directed before meals and at bedtime.   ??? blood-glucose meter (ACCU-CHEK AVIVA PLUS METER) kit Use  as directed.   ??? budesonide/formoterol (SYMBICORT HFA) 160/4.5 mcg inhalation Inhale two puffs by mouth into the lungs  twice daily. Rinse mouth after use.   ??? bumetanide (BUMEX) 2 mg tablet Take one tablet by mouth twice daily.   ??? cetirizine (ZYRTEC) 10 mg tablet Take 10 mg by mouth every morning.   ??? cholecalciferol (VITAMIN D-3) 1,000 units tablet Take 1,000 Units by mouth daily.   ??? codeine/guaiFENesin (ROBITUSSIN-AC) 10/100 mg/5 mL oral solution Take 5 mL by mouth every 4 hours as needed for Cough.   ??? duloxetine DR (CYMBALTA) 60 mg capsule Take one capsule by mouth daily.   ??? ferrous sulfate (FEOSOL, FEROSUL) 325 mg (65 mg iron) tablet Take one tablet by mouth daily. Take on empty stomach at least 1 hr before or 2 hrs after food.Take w/ vitamin C 500mg  for better absorption   ??? Flunisolide 25 mcg (0.025 %) spry Apply 2 sprays to each nostril as directed twice daily.   ??? gabapentin (NEURONTIN) 400 mg capsule Take one capsule by mouth every 8 hours.   ??? insulin aspart U-100 (NOVOLOG FLEXPEN U-100 INSULIN) 100 unit/mL injection PEN use sliding scale --max dose of 25 units per day   ??? insulin lispro(+) (HUMALOG KWIKPEN INSULIN) 100 unit/mL injection PEN see additional instructions section for insulin instructions   ??? insulin lispro(+) (HUMALOG KWIKPEN INSULIN) 100 unit/mL injection PEN Use sliding scale --max of 25 units per day   ??? insulin pen needles (disposable) (BD UF NANO PEN NEEDLES) 32 gauge x 5/32 pen needle Use one each as directed as Needed. Use with insulin injections.   ??? ipratropium bromide (ATROVENT) 42 mcg (0.06 %) nasal spray 1 spray to each nostril Q6H PRN   ??? L.ACID/L.CASEI/B.BIF/B.LON/FOS (PROBIOTIC BLEND PO) Take 1 Cap by mouth.   ??? lancets MISC Use one each as directed before meals and at bedtime. Diagnosis: steroid induced hyperglycemia   ??? lutein-zeaxanthin 25-5 mg cap Take 1 capsule by mouth daily.   ??? metoprolol XL (TOPROL XL) 25 mg extended release tablet Take 1 tablet by mouth daily.   ??? montelukast (SINGULAIR) 10 mg tablet Take one tablet by mouth at bedtime daily.   ??? nebulizer compressor medical device Use as directed.   ??? nitroglycerin (NITROSTAT) 0.4 mg tablet Place 1 Tab under tongue every 5 minutes as needed for Chest Pain.   ??? pantoprazole DR (PROTONIX) 40 mg tablet Take one tablet by mouth twice daily.   ??? prednisone (DELTASONE) 10 mg tablet Take 60mg  by mouth for 3 days. Starting 10/1 take 50mg  by mouth daily for 5 days. Starting 10/6 take 40mg  by mouth daily for 5 days, then on 10/11 take 30mg  by mouth daily for 5 days, then on 10/16 take 20mg  by mouth daily for 5 days, then 10/21 take 10mg  by mouth daily for 5 days then stop. ??? ranitidine(+) (ZANTAC) 150 mg tablet TAKE ONE TABLET BY MOUTH ONCE DAILY   ??? senna/docusate (SENOKOT-S) 8.6/50 mg tablet Take 1 Tab by mouth twice daily. (Patient taking differently: Take 1 tablet by mouth daily as needed.)   ??? sildenafil(+) (VIAGRA) 50 mg tablet Take 1 tablet by mouth as Needed for Erectile dysfunction.   ??? spironolactone (ALDACTONE) 25 mg tablet Take 1 tablet by mouth twice daily.     Vitals:    11/29/16 1237   BP: 127/78   Pulse: 102   Resp: 16   Temp: 36.6 ???C (97.8 ???F)   TempSrc: Oral   SpO2: 95%   Weight: 134.3 kg (296 lb)   Height: 180.3 cm (71)     Body mass index is 41.28  kg/m???.     Physical Exam   Constitutional: He is oriented to person, place, and time. No distress.   Morbidly obese male in NAD.     HENT:   Head: Normocephalic and atraumatic.   Nasal drainage.   Eyes: Pupils are equal, round, and reactive to light. EOM are normal. Right eye exhibits no discharge. Left eye exhibits no discharge.   Neck: Normal range of motion. Neck supple.   Cardiovascular: Normal rate, regular rhythm, normal heart sounds and intact distal pulses.  Exam reveals no gallop and no friction rub.    No murmur heard.  Pulmonary/Chest: Effort normal. No respiratory distress. He has no wheezes. He has no rales. He exhibits no tenderness.   Clear breath sounds.  No wheeze.   Musculoskeletal: He exhibits no edema or tenderness.   Neurological: He is alert and oriented to person, place, and time.   Skin: Skin is warm and dry. No rash noted. He is not diaphoretic. No erythema. No pallor.   Psychiatric: He has a normal mood and affect. His behavior is normal. Judgment and thought content normal.   Vitals reviewed.           Assessment and Plan:    1. Allergic asthma:  S/P hospitalization in September 2018 for asthma exacerbation (viral bronchitis) and decompensated heart failure.  -- Complete steroid taper.  -- Continue current asthma regimen.  -- Continue zyrtec and singulair -- Following completion of steroids and resolution of exacerbation will repeat spiro in clinic.    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  -- Pt has been on Zyflo and Xolair in the past. ???Not now.   -- Much of his previous symptoms were likely related to CHF and volume overload. ???Once we introduced him to cardiology and we were able to get his CHF under control his lung symptoms improved. ???He experiences the majority of his acute dyspnea with CHF exacerbations.  This last hospitalization was a combination of both.  2. Allergic rhinitis  -- This is his worst time of year.  Rhinitis is driver of all his symptoms.  -- Continue Singulair   -- Management per allergy.???  3. Chronic sinusitis  -- Stable  4. Bronchiectasis  -- Proven RML bronchiectasis on previous outside CT scan. Continue aggressive pulmonary clearance mechanisms with flutter valve as needed. ???I have encouraged him to use his albuterol nebs BID followed by his flutter valve. ???Does not make significant amounts of sputum. ???  5. Possible aspiration (Negative swallow)  -- Swallow study was negative for aspiration.  6. GERD on PPI  -- Continue anti-reflux meds.  -- Regimen per GI.  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 Aortic aneurysm s/p endovascular repair  -- Follows up with Dr. Chales Abrahams.  9. CAD/CHF  -- Stable. ???Monitors his weight closely and watches intake.  -- Currently compensated per pt and wife report.  10. OSA  -- Stable CPAP use.  -- Gaining good clinical benefit and using regularly. ???  -- We will put through paperwork for supplies. ???  11. Morbid obesity  -- Continue weight management and increased activity.  12. Physical deconditioning  -- Stable activity.  13. CKD  -- Most recent chemistry shows improving creatinine to 1.4.  14 BPH  -- Frequency  -- Urgency  -- Loss of control  Referral placed for urology.  ???  RTC in November.  Previously scheduled appointment.  Vaccinations  UTD

## 2016-12-06 ENCOUNTER — Ambulatory Visit: Admit: 2016-12-06 | Discharge: 2016-12-06 | Payer: MEDICARE

## 2016-12-06 DIAGNOSIS — I5042 Chronic combined systolic (congestive) and diastolic (congestive) heart failure: Principal | ICD-10-CM

## 2016-12-07 ENCOUNTER — Ambulatory Visit: Admit: 2016-12-07 | Discharge: 2016-12-08 | Payer: MEDICARE

## 2016-12-07 ENCOUNTER — Encounter: Admit: 2016-12-07 | Discharge: 2016-12-07 | Payer: MEDICARE

## 2016-12-07 DIAGNOSIS — I5042 Chronic combined systolic (congestive) and diastolic (congestive) heart failure: ICD-10-CM

## 2016-12-07 DIAGNOSIS — J9611 Chronic respiratory failure with hypoxia: ICD-10-CM

## 2016-12-07 DIAGNOSIS — Z789 Other specified health status: ICD-10-CM

## 2016-12-07 DIAGNOSIS — I714 Abdominal aortic aneurysm, without rupture: ICD-10-CM

## 2016-12-07 DIAGNOSIS — E782 Mixed hyperlipidemia: ICD-10-CM

## 2016-12-07 DIAGNOSIS — N183 Chronic kidney disease, stage 3 (moderate): ICD-10-CM

## 2016-12-07 NOTE — Progress Notes
Taken on PA Systolic PA Diastolic PA Mean Heart Rate    12-06-2016, 02:16 PM 26 mmHg 11 mmHg 16 mmHg 108 bpm    12-06-2016, 02:15 PM 24 mmHg 9 mmHg 14 mmHg 113 bpm    12-05-2016, 10:50 AM 22 mmHg 8 mmHg 13 mmHg 106 bpm    12-04-2016, 06:53 AM 25 mmHg 9 mmHg 14 mmHg 96 bpm    12-03-2016, 07:49 AM 24 mmHg 10 mmHg 15 mmHg 106 bpm    12-02-2016, 10:01 AM 28 mmHg 11 mmHg 17 mmHg 111 bpm    12-01-2016, 11:51 AM 25 mmHg 11 mmHg 16 mmHg 111 bpm    11-30-2016, 09:22 AM 26 mmHg 10 mmHg 15 mmHg 98 bpm    11-29-2016, 09:24 AM 25 mmHg 10 mmHg 15 mmHg 116 bpm    11-28-2016, 06:14 PM 24 mmHg 11 mmHg 16 mmHg 120 bpm        Date Contents         11-16-2016 Discharged from [facility]. LS   11-07-2016 Admitted to [facility] for acute respiratory failure. LS   10-09-2016 PAD 9--Within in goal, but Creat 1.85, BUN 66 on 8/17 labs. Orders from Dr. Kathreen Cosier: 1.) decrease bumex to 2mg  BID (from 4mg  BID); 2.) Hold potassium (was taking daily); 3.) BMP on Friday. LS   09-12-2016 PAD 7--OV with Dr. Kathreen Cosier today, report from Dr. Kathreen Cosier: 1. Target PA-diastolic 5-10 mmHg or whatever is currently reading as his kidney function is tolerating, weight is stable 290-295 lbs and he feels good overall. 2. Try to get back on ACE/ARB/ARNI if kidneys stablize. 3. Continue to follow with Dr. Jacqulyn Ducking and me as needed. I don't think he needs me routinely as EF is 40% and he is not requiring hospitalizations and is doing well overall. LS   08-25-2016 PAD 9--OV with Bunnie Philips today. Creat improved from 6/28. Patient will continue bumex 4mg  BID. No changes. Next HF F/U 7/24 with Dr. Kathreen Cosier. LS   08-17-2016 PAD 12--OV with Hina Baig today. CardioMEMS PAD has been ranging 11-12 over the last week. His weight has improved to 292.6 lbs (from 306 lb son 08/08/16). No changes to diuretics. LS   08-08-2016 PAD 11--Pt saw Dr. Jacqulyn Ducking today: Today he appears quite volume up, as evidenced by his significant symptomatology, and edematous physical exam. Evaluation of his Cardiomems device shows overall elevation of his diastolic PA pressures consistent with volume overload. Orders from Dr. Jacqulyn Ducking: 1.)Increase bumex to 4mg  BID & Increase k-dur to BID until next F/U visit on 6/28; 2.) BMP at HF F/U on 6/28. LS   07-18-2016 PAD 13--Within goal. No changes. LS   07-13-2016 PAD 13--(Goal 9-16) OV with JP today. No medication changes. LS   07-12-2016 PAD 9, within goal. Appt with Bunnie Philips tomorrow. LS

## 2016-12-08 DIAGNOSIS — I10 Essential (primary) hypertension: Principal | ICD-10-CM

## 2016-12-12 ENCOUNTER — Encounter: Admit: 2016-12-12 | Discharge: 2016-12-12 | Payer: MEDICARE

## 2016-12-12 DIAGNOSIS — M48061 Spinal stenosis, lumbar region without neurogenic claudication: Principal | ICD-10-CM

## 2016-12-13 ENCOUNTER — Ambulatory Visit: Admit: 2016-12-13 | Discharge: 2016-12-13 | Payer: MEDICARE

## 2016-12-13 ENCOUNTER — Encounter: Admit: 2016-12-13 | Discharge: 2016-12-13 | Payer: MEDICARE

## 2016-12-13 DIAGNOSIS — J189 Pneumonia, unspecified organism: ICD-10-CM

## 2016-12-13 DIAGNOSIS — I5042 Chronic combined systolic (congestive) and diastolic (congestive) heart failure: ICD-10-CM

## 2016-12-13 DIAGNOSIS — K219 Gastro-esophageal reflux disease without esophagitis: ICD-10-CM

## 2016-12-13 DIAGNOSIS — E669 Obesity, unspecified: ICD-10-CM

## 2016-12-13 DIAGNOSIS — M48061 Spinal stenosis, lumbar region without neurogenic claudication: Principal | ICD-10-CM

## 2016-12-13 DIAGNOSIS — G4730 Sleep apnea, unspecified: ICD-10-CM

## 2016-12-13 DIAGNOSIS — M961 Postlaminectomy syndrome, not elsewhere classified: ICD-10-CM

## 2016-12-13 DIAGNOSIS — I429 Cardiomyopathy, unspecified: ICD-10-CM

## 2016-12-13 DIAGNOSIS — J449 Chronic obstructive pulmonary disease, unspecified: ICD-10-CM

## 2016-12-13 DIAGNOSIS — Z8614 Personal history of Methicillin resistant Staphylococcus aureus infection: ICD-10-CM

## 2016-12-13 DIAGNOSIS — M4316 Spondylolisthesis, lumbar region: ICD-10-CM

## 2016-12-13 DIAGNOSIS — R05 Cough: ICD-10-CM

## 2016-12-13 DIAGNOSIS — J302 Other seasonal allergic rhinitis: ICD-10-CM

## 2016-12-13 DIAGNOSIS — Z8679 Personal history of other diseases of the circulatory system: ICD-10-CM

## 2016-12-13 DIAGNOSIS — L03116 Cellulitis of left lower limb: ICD-10-CM

## 2016-12-13 DIAGNOSIS — I714 Abdominal aortic aneurysm, without rupture: ICD-10-CM

## 2016-12-13 DIAGNOSIS — I509 Heart failure, unspecified: ICD-10-CM

## 2016-12-13 DIAGNOSIS — Z95828 Presence of other vascular implants and grafts: ICD-10-CM

## 2016-12-13 DIAGNOSIS — E785 Hyperlipidemia, unspecified: ICD-10-CM

## 2016-12-13 DIAGNOSIS — J45909 Unspecified asthma, uncomplicated: ICD-10-CM

## 2016-12-13 DIAGNOSIS — M954 Acquired deformity of chest and rib: ICD-10-CM

## 2016-12-13 DIAGNOSIS — Z9981 Dependence on supplemental oxygen: ICD-10-CM

## 2016-12-13 DIAGNOSIS — J479 Bronchiectasis, uncomplicated: ICD-10-CM

## 2016-12-13 DIAGNOSIS — G4733 Obstructive sleep apnea (adult) (pediatric): ICD-10-CM

## 2016-12-13 DIAGNOSIS — I2510 Atherosclerotic heart disease of native coronary artery without angina pectoris: Secondary | ICD-10-CM

## 2016-12-13 DIAGNOSIS — I10 Essential (primary) hypertension: Secondary | ICD-10-CM

## 2016-12-13 DIAGNOSIS — R609 Edema, unspecified: ICD-10-CM

## 2016-12-13 DIAGNOSIS — N183 Chronic kidney disease, stage 3 (moderate): ICD-10-CM

## 2016-12-13 DIAGNOSIS — E782 Mixed hyperlipidemia: ICD-10-CM

## 2016-12-13 DIAGNOSIS — R0600 Dyspnea, unspecified: ICD-10-CM

## 2016-12-13 NOTE — Progress Notes
SPINE CENTER HISTORY AND PHYSICAL      CHIEF COMPLAINT     Chief Complaint   Patient presents with   ??? Lower Back - Buttocks pain, Leg Pain          HISTORY OF PRESENT ILLNESS   Ruben Wood. is a 76 y.o. male.  Is a 76 year old male comes today for further evaluation of back and left leg pain.  Patient notes that the left leg pain radiates down the posterior aspect of his leg to roughly level of his knee.  This does seem to be somewhat claudicant in nature and is worse with standing and walking and significantly improved with sitting down.  He does have a history of a what appears to be L4-S1 decompression and possibly non-instrumented fusion of L5-S1 sometime in the 2000.  He is unsure exactly when this was done.  This was done at [facility] spine but were unsure of who the surgeon was.  No other previous operations.  He has tried physical therapy recently in addition to injections in the past.  Patient has a number of medical problems.  He is unable to have an MRI    Arm/Leg Pain (%) / Neck/Back Pain (%) -back and left leg pain  Previous Spine Operations -previously mentioned L4-S1 decompression and what appears to be a fusion at L5-S1    NSAIDs/Pain Meds -  yes  Physical Therapy -  yes  Injections -  yes          PAST MEDICAL HISTORY     Past Medical History:   Diagnosis Date   ??? AAA (abdominal aortic aneurysm) (HCC) 10/15/2012   ??? Asthma    ??? Bronchiectasis (HCC)    ??? CAD (coronary artery disease) 08/15/2012    07/11/2002- Cath @ [facility] showed Severe double vessel disease(OMB of circumflex and distal circ)  inferior-basilar dyskinesis.                 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  09/12/12   Cath    ??? Cardiomyopathy (HCC) 07/19/2014   ??? Cellulitis of left lower extremity 11/12/2014 10/2014: admitted with cellulitis, [place] negative for venous clot, MRI without osteomyelitis.    11/12/2014 In clinic today, still with cellulitis.  Per patient and his wife, the erythema may be extending.  Cultures from drainage in hospital with MSSA, but Blood Cx negative.  No e/o osteomyelitis.  My concern is that this antibiotic regimen is not adequately treating his cellulitis.  We will broaden coverage to get MRSA with doxycycline.  Patient and wife given strict call/return criteria.  I will see patient in 3-4 days for re-evaluation.  No fever today.  Plan:  Stop cefpodoxime and start doxycylcine  11/17/2014 Much better, no warmth and erythema minimal.   Plan: continue doxycyline for full 10 day course.      ??? Chest wall deformity 07/09/2012    Chest wall reconstruction on 07/09/12    ??? CHF (congestive heart failure) (HCC)    ??? 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 Chronic cough 08/29/2012   ??? 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 CKD (chronic kidney disease) stage 3, GFR 30-59 ml/min (HCC) 08/26/2013   ??? COPD (chronic obstructive pulmonary disease) (HCC)  08/13/2012   ??? Dyspnea    ??? Edema    ??? Essential hypertension 08/13/2012   ??? GERD (gastroesophageal reflux disease) 08/13/2012   ??? History of CHF (congestive heart failure) 08/13/2012   ??? History of MRSA infection 10/14/2012   ??? 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    ??? HTN (hypertension)    ??? Hyperlipemia    ??? Hypertension 08/13/2012   ??? 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 Mixed dyslipidemia 08/26/2013   ??? Morbid obesity (HCC) 08/13/2012 ??? 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  Discussed the importance of diet.  Exercise as tolerated.      ??? Obesity    ??? On supplemental oxygen therapy    ??? OSA on CPAP 08/13/2012   ??? Pneumonia 04/2012    [facility]   ??? Seasonal allergic reaction    ??? Sleep apnea        PAST SURGICAL HISTORY     Past Surgical History:   Procedure Laterality Date   ??? BACK SURGERY  Jan 2016   ??? VARICOSE VEIN SURGERY Left 01/05/2016    left small saphenous endovenous ablation with left leg microphlebectomy-Dr. Junita Push   ??? HX MICROPHLEBECTOMY Right 07/06/2016    [place]   ??? HX ENDOVENOUS ABLATION OF THE SMALL SAPHENOUS VEIN Right 07/06/2016    [place]   ??? BRONCHOSCOPY     ??? CARDIAC CATHERIZATION  several years ago    [place]   ??? DOPPLER ECHOCARDIOGRAPHY     ??? HERNIA REPAIR     ??? HX CHOLECYSTECTOMY     ??? HX HEART CATHETERIZATION     ??? HX LUMBAR LAMINECTOMY     ??? LUNG SURGERY         FAMILY HISTORY   family history includes Cancer in his father and mother; Coronary Artery Disease in his father; Stroke in his sister.    SOCIAL HISTORY     Social History     Social History   ??? Marital status: Married     Spouse name: [name]   ??? Number of children: 0   ??? Years of education: N/A     Occupational History   ??? former Warden/ranger Retired     lives in a 76 year old house with wife     Social History Main Topics   ??? Smoking status: Former Smoker     Packs/day: 2.00     Years: 40.00     Quit date: 07/08/2000   ??? Smokeless tobacco: Never Used   ??? Alcohol use Yes      Comment: on occasion 1-2 drinks/week   ??? Drug use: No   ??? Sexual activity: No     Other Topics Concern   ??? Not on file     Social History Narrative   ??? No narrative on file       ALLERGIES  Allergies   Allergen Reactions ??? Other [Unclassified Drug] BLISTERS     Hepacleanse , caused blister to skin        MEDICATIONS     Current Outpatient Prescriptions:   ???  acetaminophen (TYLENOL) 500 mg tablet, Take one tablet by mouth every 6 hours as needed for Pain. Max of 4,000 mg of acetaminophen in 24 hours., Disp: , Rfl:   ???  albuterol (PROAIR HFA, VENTOLIN HFA, OR PROVENTIL HFA) 90 mcg/actuation inhaler, Inhale two puffs by mouth into the lungs every 4 hours as needed for Wheezing or Shortness of Breath., Disp: 1 Inhaler, Rfl: 0  ???  albuterol 0.083% (PROVENTIL; VENTOLIN) 2.5 mg /3 mL (0.083 %) nebulizer solution, Inhale 3 mL solution by nebulizer as directed every 6 hours as needed for Wheezing or Shortness of Breath., Disp: , Rfl:   ???  allopurinol (ZYLOPRIM) 300 mg tablet, Take one tablet by mouth daily., Disp: 90 tablet, Rfl: 3  ???  AMITRIPTYLINE HCL (AMITRIPTYLINE/GABAPENTIN/EMU OIL(#)) 05/24/08 %, Apply 5 g topically to affected area three times daily., Disp: 100 g, Rfl: 11  ???  ascorbic acid (VITAMIN C) 500 mg tablet, Take one tablet by mouth daily., Disp: 90 tablet, Rfl: 3  ???  aspirin 325 mg tablet, Take 325 mg by mouth daily. Take with food., Disp: , Rfl:   ???  atorvastatin (LIPITOR) 20 mg tablet, TAKE ONE TABLET BY MOUTH ONCE DAILY, Disp: 90 tablet, Rfl: 3  ???  blood sugar diagnostic (GLUCOCARD EXPRESSION) test strip, Use one strip as directed before meals and at bedtime., Disp: 100 strip, Rfl: 0  ???  blood-glucose meter (ACCU-CHEK AVIVA PLUS METER) kit, Use  as directed., Disp: 1 kit, Rfl: 0  ???  budesonide/formoterol (SYMBICORT HFA) 160/4.5 mcg inhalation, Inhale two puffs by mouth into the lungs twice daily. Rinse mouth after use., Disp: 3 Inhaler, Rfl: 3  ???  bumetanide (BUMEX) 2 mg tablet, Take one tablet by mouth twice daily., Disp: 180 tablet, Rfl: 3  ???  cetirizine (ZYRTEC) 10 mg tablet, Take 10 mg by mouth every morning., Disp: , Rfl:   ???  cholecalciferol (VITAMIN D-3) 1,000 units tablet, Take 1,000 Units by mouth daily., Disp: , Rfl:   ???  codeine/guaiFENesin (ROBITUSSIN-AC) 10/100 mg/5 mL oral solution, Take 5 mL by mouth every 4 hours as needed for Cough., Disp: 118 mL, Rfl: 0  ???  duloxetine DR (CYMBALTA) 60 mg capsule, Take one capsule by mouth daily., Disp: 90 capsule, Rfl: 3  ???  ferrous sulfate (FEOSOL, FEROSUL) 325 mg (65 mg iron) tablet, Take one tablet by mouth daily. Take on empty stomach at least 1 hr before or 2 hrs after food.Take w/ vitamin C 500mg  for better absorption, Disp: 90 tablet, Rfl: 3  ???  Flunisolide 25 mcg (0.025 %) spry, Apply 2 sprays to each nostril as directed twice daily., Disp: 25 mL, Rfl: 3  ???  gabapentin (NEURONTIN) 400 mg capsule, Take one capsule by mouth every 8 hours., Disp: 270 capsule, Rfl: 3  ???  insulin aspart U-100 (NOVOLOG FLEXPEN U-100 INSULIN) 100 unit/mL injection PEN, use sliding scale --max dose of 25 units per day, Disp: 15 mL, Rfl: 0  ???  insulin lispro(+) (HUMALOG KWIKPEN INSULIN) 100 unit/mL injection PEN, see additional instructions section for insulin instructions, Disp: 15 mL, Rfl: 0  ???  insulin lispro(+) (HUMALOG KWIKPEN INSULIN) 100 unit/mL injection PEN, Use sliding scale --max of 25 units per day, Disp: 15 mL, Rfl: 0  ???  insulin pen  needles (disposable) (BD UF NANO PEN NEEDLES) 32 gauge x 5/32 pen needle, Use one each as directed as Needed. Use with insulin injections., Disp: 100 each, Rfl: 0  ???  ipratropium bromide (ATROVENT) 42 mcg (0.06 %) nasal spray, 1 spray to each nostril Q6H PRN, Disp: 15 mL, Rfl: 12  ???  L.ACID/L.CASEI/B.BIF/B.LON/FOS (PROBIOTIC BLEND PO), Take 1 Cap by mouth., Disp: , Rfl:   ???  lancets MISC, Use one each as directed before meals and at bedtime. Diagnosis: steroid induced hyperglycemia, Disp: 100 each, Rfl: 0  ???  lutein-zeaxanthin 25-5 mg cap, Take 1 capsule by mouth daily., Disp: , Rfl:   ???  metoprolol XL (TOPROL XL) 25 mg extended release tablet, Take 1 tablet by mouth daily., Disp: 90 tablet, Rfl: 3 ???  montelukast (SINGULAIR) 10 mg tablet, Take one tablet by mouth at bedtime daily., Disp: 90 tablet, Rfl: 3  ???  nebulizer compressor medical device, Use as directed., Disp: 1 Device, Rfl: 0  ???  nitroglycerin (NITROSTAT) 0.4 mg tablet, Place 1 Tab under tongue every 5 minutes as needed for Chest Pain., Disp: 25 Tab, Rfl: 1  ???  pantoprazole DR (PROTONIX) 40 mg tablet, Take one tablet by mouth twice daily., Disp: 60 tablet, Rfl: 11  ???  prednisone (DELTASONE) 10 mg tablet, Take 60mg  by mouth for 3 days. Starting 10/1 take 50mg  by mouth daily for 5 days. Starting 10/6 take 40mg  by mouth daily for 5 days, then on 10/11 take 30mg  by mouth daily for 5 days, then on 10/16 take 20mg  by mouth daily for 5 days, then 10/21 take 10mg  by mouth daily for 5 days then stop., Disp: 93 tablet, Rfl: 0  ???  ranitidine(+) (ZANTAC) 150 mg tablet, TAKE ONE TABLET BY MOUTH ONCE DAILY, Disp: 60 tablet, Rfl: 2  ???  senna/docusate (SENOKOT-S) 8.6/50 mg tablet, Take 1 Tab by mouth twice daily. (Patient taking differently: Take 1 tablet by mouth daily as needed.), Disp: 60 Tab, Rfl: 0  ???  sildenafil(+) (VIAGRA) 50 mg tablet, Take 1 tablet by mouth as Needed for Erectile dysfunction., Disp: 10 tablet, Rfl: 5  ???  spironolactone (ALDACTONE) 25 mg tablet, Take 1 tablet by mouth twice daily., Disp: 180 tablet, Rfl: 3    REVIEW OF SYSTEMS   Review of Systems   Constitutional: Positive for fatigue.   HENT: Positive for hearing loss, postnasal drip, sneezing and voice change.    Eyes: Positive for photophobia.   Respiratory: Positive for apnea, cough and shortness of breath.    Cardiovascular: Positive for leg swelling.   Endocrine: Negative.    Genitourinary: Positive for frequency.   Musculoskeletal: Positive for back pain and gait problem.   Skin: Negative.    Allergic/Immunologic: Positive for environmental allergies.   Neurological: Positive for dizziness.   Hematological: Negative. Psychiatric/Behavioral: Positive for sleep disturbance. The patient is nervous/anxious.        PHYSICAL EXAM     Vitals:    12/13/16 1112   BP: 144/85   Pulse: 92   SpO2: 93%   Weight: (!) 137 kg (302 lb 1.9 oz)   Height: 180 cm (70.87)     Oswestry Total Score:: 36  Pain Score: Nine  Body mass index is 42.3 kg/m???.    Constitutional: Alert, NAD  Psychiatric: Mood and affect appropriate  Eyes: EOMI  Respiratory: Unlabored breathing  Cardiovascular: Regular rate  Skin: No rashes or open wounds appreciated on back, patient has a nicely healed scar on his lumbar region  Musculoskeletal: 5/5 strength throughout bilat LEs (hip flexion, knee ext/flexion, ankle DF/PF, great toe extension), lumbar ROM limited secondary to pain, negative straight leg raise bilat, significant venous stasis dermatitis changes to his lower legs  Neurologic: Sensation intact to light touch L2-S1 except paresthesias in his feet, no hyperreflexia, no ankle clonus, downgoing Babinski      RADIOGRAPHIC EVALUATION   On review the patient's standing films and CT scan patient has evidence of a L4-5 spondylolisthesis at the site of the previous decompression.  His CT scan demonstrates what appears to be decompressions at L4-5 and L5-S1.  He does appear to have a solid posterior fusion at L5-S1 I am not sure that this was secondary to his operation versus just an autofusion       ASSESSMENT / PLAN   76 year old male with postlaminectomy instability versus degenerative spinal listhesis at L4-5 and concern for possible claudicant stenosis    Natural history and conservative treatment options were discussed with the patient.  I unfortunately do not think the patient would be an operative candidate in his given state.  His BMI is 42.3 he has multiple medical problems.  I would have significant concerns putting him on the operating table.  I think that he may have some real pathology I think is can be in his best interest if he can avoid an operation.  I be happy to see him back on an as-needed basis in the future.  I think that if he were able to lose a significant amount of weight in the variety of 40-50 pounds in his medical providers thought he could withstand the surgery I would at least consider it.  I think at that point I would consider getting a CT myelogram although I do not think that is necessary to get that at this time given the fact that were not considering surgery.  I think he can certainly continue PT if this continues to be helpful for him.    This note was created using Dragon, a Chemical engineer.  Please contact my office for any clarification of documentation.

## 2017-01-03 ENCOUNTER — Encounter: Admit: 2017-01-03 | Discharge: 2017-01-03 | Payer: MEDICARE

## 2017-01-03 DIAGNOSIS — J45901 Unspecified asthma with (acute) exacerbation: Principal | ICD-10-CM

## 2017-01-03 MED ORDER — LEVOFLOXACIN 750 MG PO TAB
750 mg | ORAL_TABLET | Freq: Every day | ORAL | 0 refills | 7.00000 days | Status: AC
Start: 2017-01-03 — End: ?

## 2017-01-03 MED ORDER — PREDNISONE 10 MG PO TAB
40 mg | ORAL_TABLET | Freq: Every day | ORAL | 0 refills | Status: AC
Start: 2017-01-03 — End: ?

## 2017-01-03 NOTE — Telephone Encounter
RN called patient. Reports PC starting over the weekend with white to yellow phlegm. Has been waking up at night coughing. Noted wheezing following inhaler regimen this morning. No increased in LE edema. Denies increased chest tightness, SOB, fevers, chills or night sweats.     Received flu vaccine in early September. Has not been exposed to any illness that patient is aware of.     Taking diuretics as directed, Symbicort 2 puffs BID with spacer, montelukast, and cetirizine. Hasn't been using albuterol HFA or nebulizer, but voiced plan to start q 4 hours, followed by flutter use. RN recommended addition of Atrovent nasal spray prescribed by Dr. Rhona Leavens, especially prior to bed.     Patient has not tried any OTC medications including Mucinex, or cough suppressant.     RN will discuss with Dr. [name], and call patient back.

## 2017-01-10 ENCOUNTER — Ambulatory Visit: Admit: 2017-01-10 | Discharge: 2017-01-10 | Payer: MEDICARE

## 2017-01-10 DIAGNOSIS — I5042 Chronic combined systolic (congestive) and diastolic (congestive) heart failure: Principal | ICD-10-CM

## 2017-01-16 ENCOUNTER — Encounter: Admit: 2017-01-16 | Discharge: 2017-01-16 | Payer: MEDICARE

## 2017-01-16 ENCOUNTER — Ambulatory Visit: Admit: 2017-01-16 | Discharge: 2017-01-16 | Payer: MEDICARE

## 2017-01-16 DIAGNOSIS — J45909 Unspecified asthma, uncomplicated: ICD-10-CM

## 2017-01-16 DIAGNOSIS — N183 Chronic kidney disease, stage 3 (moderate): ICD-10-CM

## 2017-01-16 DIAGNOSIS — G4733 Obstructive sleep apnea (adult) (pediatric): Principal | ICD-10-CM

## 2017-01-16 DIAGNOSIS — Z8614 Personal history of Methicillin resistant Staphylococcus aureus infection: ICD-10-CM

## 2017-01-16 DIAGNOSIS — G4730 Sleep apnea, unspecified: ICD-10-CM

## 2017-01-16 DIAGNOSIS — J479 Bronchiectasis, uncomplicated: ICD-10-CM

## 2017-01-16 DIAGNOSIS — I714 Abdominal aortic aneurysm, without rupture: ICD-10-CM

## 2017-01-16 DIAGNOSIS — I5042 Chronic combined systolic (congestive) and diastolic (congestive) heart failure: ICD-10-CM

## 2017-01-16 DIAGNOSIS — E782 Mixed hyperlipidemia: ICD-10-CM

## 2017-01-16 DIAGNOSIS — M954 Acquired deformity of chest and rib: ICD-10-CM

## 2017-01-16 DIAGNOSIS — R609 Edema, unspecified: ICD-10-CM

## 2017-01-16 DIAGNOSIS — M961 Postlaminectomy syndrome, not elsewhere classified: ICD-10-CM

## 2017-01-16 DIAGNOSIS — J302 Other seasonal allergic rhinitis: ICD-10-CM

## 2017-01-16 DIAGNOSIS — J449 Chronic obstructive pulmonary disease, unspecified: ICD-10-CM

## 2017-01-16 DIAGNOSIS — L03116 Cellulitis of left lower limb: ICD-10-CM

## 2017-01-16 DIAGNOSIS — Z9981 Dependence on supplemental oxygen: ICD-10-CM

## 2017-01-16 DIAGNOSIS — I10 Essential (primary) hypertension: Secondary | ICD-10-CM

## 2017-01-16 DIAGNOSIS — J189 Pneumonia, unspecified organism: ICD-10-CM

## 2017-01-16 DIAGNOSIS — J4541 Moderate persistent asthma with (acute) exacerbation: ICD-10-CM

## 2017-01-16 DIAGNOSIS — Z95828 Presence of other vascular implants and grafts: ICD-10-CM

## 2017-01-16 DIAGNOSIS — K219 Gastro-esophageal reflux disease without esophagitis: ICD-10-CM

## 2017-01-16 DIAGNOSIS — I429 Cardiomyopathy, unspecified: ICD-10-CM

## 2017-01-16 DIAGNOSIS — R05 Cough: ICD-10-CM

## 2017-01-16 DIAGNOSIS — E785 Hyperlipidemia, unspecified: ICD-10-CM

## 2017-01-16 DIAGNOSIS — E669 Obesity, unspecified: ICD-10-CM

## 2017-01-16 DIAGNOSIS — R0600 Dyspnea, unspecified: ICD-10-CM

## 2017-01-16 DIAGNOSIS — I509 Heart failure, unspecified: ICD-10-CM

## 2017-01-16 DIAGNOSIS — Z8679 Personal history of other diseases of the circulatory system: ICD-10-CM

## 2017-01-16 DIAGNOSIS — I2510 Atherosclerotic heart disease of native coronary artery without angina pectoris: Secondary | ICD-10-CM

## 2017-01-16 NOTE — Progress Notes
Date of Service: 01/16/2017    Subjective:             Alphonza Feltham. is a 76 y.o. male.    History of Present Illness  Mr. Poston is a 76 y/o M with a pmh of allergic asthma, OSA, allergic rhinitis, bronchiectasis who presents to clinic for follow up. Since his last visit he had an acute exacerbation of his asthma. He contacted the clinic and was prescribed a 5 day course of steroids and course of Levaquin. He reports after 3 days his symptoms improved and his breathing is back to baseline. He notes using his rescue inhaler approximately 1-3/per week. He denies nocturnal symptoms. He reports compliance with his CPAP, but his wife has noticed and air leak around his mask.    He continues to attend rehab and still is working as a Lawyer.          Review of Systems   Constitutional: Positive for fatigue.   HENT: Positive for rhinorrhea.    Eyes: Positive for photophobia.   Respiratory: Positive for shortness of breath.    Cardiovascular: Positive for leg swelling.   Neurological: Positive for dizziness.   All other systems reviewed and are negative.        Objective:         ??? acetaminophen (TYLENOL) 500 mg tablet Take one tablet by mouth every 6 hours as needed for Pain. Max of 4,000 mg of acetaminophen in 24 hours.   ??? albuterol (PROAIR HFA, VENTOLIN HFA, OR PROVENTIL HFA) 90 mcg/actuation inhaler Inhale two puffs by mouth into the lungs every 4 hours as needed for Wheezing or Shortness of Breath.   ??? albuterol 0.083% (PROVENTIL; VENTOLIN) 2.5 mg /3 mL (0.083 %) nebulizer solution Inhale 3 mL solution by nebulizer as directed every 6 hours as needed for Wheezing or Shortness of Breath.   ??? allopurinol (ZYLOPRIM) 300 mg tablet Take one tablet by mouth daily.   ??? AMITRIPTYLINE HCL (AMITRIPTYLINE/GABAPENTIN/EMU OIL(#)) 05/24/08 % Apply 5 g topically to affected area three times daily.   ??? ascorbic acid (VITAMIN C) 500 mg tablet Take one tablet by mouth daily. ??? aspirin 325 mg tablet Take 325 mg by mouth daily. Take with food.   ??? atorvastatin (LIPITOR) 20 mg tablet TAKE ONE TABLET BY MOUTH ONCE DAILY   ??? blood sugar diagnostic (GLUCOCARD EXPRESSION) test strip Use one strip as directed before meals and at bedtime.   ??? blood-glucose meter (ACCU-CHEK AVIVA PLUS METER) kit Use  as directed.   ??? budesonide/formoterol (SYMBICORT HFA) 160/4.5 mcg inhalation Inhale two puffs by mouth into the lungs twice daily. Rinse mouth after use.   ??? bumetanide (BUMEX) 2 mg tablet Take one tablet by mouth twice daily.   ??? cetirizine (ZYRTEC) 10 mg tablet Take 10 mg by mouth every morning.   ??? cholecalciferol (VITAMIN D-3) 1,000 units tablet Take 1,000 Units by mouth daily.   ??? codeine/guaiFENesin (ROBITUSSIN-AC) 10/100 mg/5 mL oral solution Take 5 mL by mouth every 4 hours as needed for Cough.   ??? duloxetine DR (CYMBALTA) 60 mg capsule Take one capsule by mouth daily.   ??? ferrous sulfate (FEOSOL, FEROSUL) 325 mg (65 mg iron) tablet Take one tablet by mouth daily. Take on empty stomach at least 1 hr before or 2 hrs after food.Take w/ vitamin C 500mg  for better absorption   ??? Flunisolide 25 mcg (0.025 %) spry Apply 2 sprays to each nostril as directed twice daily.   ???  gabapentin (NEURONTIN) 400 mg capsule Take one capsule by mouth every 8 hours.   ??? insulin aspart U-100 (NOVOLOG FLEXPEN U-100 INSULIN) 100 unit/mL injection PEN use sliding scale --max dose of 25 units per day   ??? insulin lispro(+) (HUMALOG KWIKPEN INSULIN) 100 unit/mL injection PEN see additional instructions section for insulin instructions   ??? insulin lispro(+) (HUMALOG KWIKPEN INSULIN) 100 unit/mL injection PEN Use sliding scale --max of 25 units per day   ??? insulin pen needles (disposable) (BD UF NANO PEN NEEDLES) 32 gauge x 5/32 pen needle Use one each as directed as Needed. Use with insulin injections.   ??? ipratropium bromide (ATROVENT) 42 mcg (0.06 %) nasal spray 1 spray to each nostril Q6H PRN ??? L.ACID/L.CASEI/B.BIF/B.LON/FOS (PROBIOTIC BLEND PO) Take 1 Cap by mouth.   ??? lancets MISC Use one each as directed before meals and at bedtime. Diagnosis: steroid induced hyperglycemia   ??? lutein-zeaxanthin 25-5 mg cap Take 1 capsule by mouth daily.   ??? metoprolol XL (TOPROL XL) 25 mg extended release tablet Take 1 tablet by mouth daily.   ??? montelukast (SINGULAIR) 10 mg tablet Take one tablet by mouth at bedtime daily.   ??? nebulizer compressor medical device Use as directed.   ??? nitroglycerin (NITROSTAT) 0.4 mg tablet Place 1 Tab under tongue every 5 minutes as needed for Chest Pain.   ??? pantoprazole DR (PROTONIX) 40 mg tablet Take one tablet by mouth twice daily.   ??? ranitidine(+) (ZANTAC) 150 mg tablet TAKE ONE TABLET BY MOUTH ONCE DAILY   ??? senna/docusate (SENOKOT-S) 8.6/50 mg tablet Take 1 Tab by mouth twice daily. (Patient taking differently: Take 1 tablet by mouth daily as needed.)   ??? sildenafil(+) (VIAGRA) 50 mg tablet Take 1 tablet by mouth as Needed for Erectile dysfunction.   ??? spironolactone (ALDACTONE) 25 mg tablet Take 1 tablet by mouth twice daily.     Vitals:    01/16/17 1515   BP: 124/76   Pulse: 101   Resp: 18   Temp: 36.5 ???C (97.7 ???F)   TempSrc: Oral   SpO2: 92%   Weight: (!) 138 kg (304 lb 3.2 oz)   Height: 180 cm (70.87)     Body mass index is 42.58 kg/m???.     Physical Exam   Constitutional: He is oriented to person, place, and time. He appears well-developed and well-nourished.   HENT:   Head: Normocephalic and atraumatic.   Mouth/Throat: Oropharynx is clear and moist.   Eyes: Pupils are equal, round, and reactive to light. Conjunctivae are normal.   Cardiovascular: Normal rate and regular rhythm.    1+ pitting peripheral edema   Pulmonary/Chest: Effort normal and breath sounds normal. He has no wheezes. He has no rales.   Abdominal:   Obese   Neurological: He is alert and oriented to person, place, and time.            Assessment and Plan: Mr. Humm is a 76 y/o M with a pmh of allergic asthma, OSA, allergic rhinitis, bronchiectasis who presents to clinic for follow up.     OSA  - Previously followed with Dr. [name] Rieger  - Hasn't had sleep study in years  - Wife notes some air leak with nasal mask   > Will arrange sleep study   > Will arrange follow up with Sleep clinic    Allergic Asthma  - Hospitalized in 10/2016 for asthma exacerbation and heart failure  - 11/14 had repeat exacerbation requiring prednisone and Abx  -  Symptoms improved after 3 days of steroids and Abx  - PFTs with mixed obstructive and restrictive defect  - Spirometry today reduced compared to previous but likely technique   > Repeat PFTs with next visit   > Continue Symbicort, Atrovent and Albuterol    Allergic Rhinitis  - Fall worst time of year   > Continue Singulair   > Remainder per Allergy    Chronic sinusitis: unchanged    Bronchiectasis  - Proven RML bronchiectasis on previous outside CT scan   > Continue aggressive pulmonary hygeine with flutter valve as needed    Concern for aspiration: swallow study negative previously    GERD: Continue PPI per GI    S/p lung herniation with bio-bridge repair    AAA s/p endovascular repair: Follow up with Dr. Chales Abrahams as scheduled    CAD/CHF: currently compensated    Morbid obesity  - Reports some weight gain with prednisone use   > Encouraged continued activity and attempts at weight loss    BPH: Previously referred to urology    RTC in 4 months    Pt seen and discussed with Dr. Lenn Cal Poplin   Internal Medicine PGY-2  Pager [phone]    ATTESTATION    I personally performed the key portions of the E/M visit, discussed case with resident and concur with resident documentation of history, physical exam, assessment, and treatment plan unless otherwise noted.    Staff name:  Lady Saucier, MD Date:  01/17/2017

## 2017-01-17 ENCOUNTER — Encounter: Admit: 2017-01-17 | Discharge: 2017-01-17 | Payer: MEDICARE

## 2017-01-17 DIAGNOSIS — K219 Gastro-esophageal reflux disease without esophagitis: ICD-10-CM

## 2017-01-17 DIAGNOSIS — J449 Chronic obstructive pulmonary disease, unspecified: ICD-10-CM

## 2017-01-17 DIAGNOSIS — Z95828 Presence of other vascular implants and grafts: ICD-10-CM

## 2017-01-17 DIAGNOSIS — J479 Bronchiectasis, uncomplicated: ICD-10-CM

## 2017-01-17 DIAGNOSIS — I5042 Chronic combined systolic (congestive) and diastolic (congestive) heart failure: ICD-10-CM

## 2017-01-17 DIAGNOSIS — J45909 Unspecified asthma, uncomplicated: ICD-10-CM

## 2017-01-17 DIAGNOSIS — M961 Postlaminectomy syndrome, not elsewhere classified: ICD-10-CM

## 2017-01-17 DIAGNOSIS — L03116 Cellulitis of left lower limb: ICD-10-CM

## 2017-01-17 DIAGNOSIS — J189 Pneumonia, unspecified organism: ICD-10-CM

## 2017-01-17 DIAGNOSIS — G4730 Sleep apnea, unspecified: ICD-10-CM

## 2017-01-17 DIAGNOSIS — E782 Mixed hyperlipidemia: ICD-10-CM

## 2017-01-17 DIAGNOSIS — M954 Acquired deformity of chest and rib: ICD-10-CM

## 2017-01-17 DIAGNOSIS — R05 Cough: ICD-10-CM

## 2017-01-17 DIAGNOSIS — E785 Hyperlipidemia, unspecified: ICD-10-CM

## 2017-01-17 DIAGNOSIS — R609 Edema, unspecified: ICD-10-CM

## 2017-01-17 DIAGNOSIS — I509 Heart failure, unspecified: ICD-10-CM

## 2017-01-17 DIAGNOSIS — Z9981 Dependence on supplemental oxygen: ICD-10-CM

## 2017-01-17 DIAGNOSIS — N183 Chronic kidney disease, stage 3 (moderate): ICD-10-CM

## 2017-01-17 DIAGNOSIS — Z8679 Personal history of other diseases of the circulatory system: ICD-10-CM

## 2017-01-17 DIAGNOSIS — I429 Cardiomyopathy, unspecified: ICD-10-CM

## 2017-01-17 DIAGNOSIS — I714 Abdominal aortic aneurysm, without rupture: ICD-10-CM

## 2017-01-17 DIAGNOSIS — E669 Obesity, unspecified: ICD-10-CM

## 2017-01-17 DIAGNOSIS — Z8614 Personal history of Methicillin resistant Staphylococcus aureus infection: ICD-10-CM

## 2017-01-17 DIAGNOSIS — I2510 Atherosclerotic heart disease of native coronary artery without angina pectoris: ICD-10-CM

## 2017-01-17 DIAGNOSIS — R0600 Dyspnea, unspecified: ICD-10-CM

## 2017-01-17 DIAGNOSIS — I10 Essential (primary) hypertension: Principal | ICD-10-CM

## 2017-01-17 DIAGNOSIS — J302 Other seasonal allergic rhinitis: ICD-10-CM

## 2017-01-17 DIAGNOSIS — G4733 Obstructive sleep apnea (adult) (pediatric): ICD-10-CM

## 2017-01-18 ENCOUNTER — Encounter: Admit: 2017-01-18 | Discharge: 2017-01-18 | Payer: MEDICARE

## 2017-01-18 DIAGNOSIS — G4733 Obstructive sleep apnea (adult) (pediatric): ICD-10-CM

## 2017-01-18 DIAGNOSIS — J4540 Moderate persistent asthma, uncomplicated: Principal | ICD-10-CM

## 2017-01-22 ENCOUNTER — Ambulatory Visit: Admit: 2017-01-22 | Discharge: 2017-01-22 | Payer: MEDICARE

## 2017-01-22 ENCOUNTER — Encounter: Admit: 2017-01-22 | Discharge: 2017-01-22 | Payer: MEDICARE

## 2017-01-22 DIAGNOSIS — E782 Mixed hyperlipidemia: ICD-10-CM

## 2017-01-22 DIAGNOSIS — J302 Other seasonal allergic rhinitis: ICD-10-CM

## 2017-01-22 DIAGNOSIS — G4733 Obstructive sleep apnea (adult) (pediatric): ICD-10-CM

## 2017-01-22 DIAGNOSIS — J45909 Unspecified asthma, uncomplicated: ICD-10-CM

## 2017-01-22 DIAGNOSIS — I2510 Atherosclerotic heart disease of native coronary artery without angina pectoris: Secondary | ICD-10-CM

## 2017-01-22 DIAGNOSIS — Z8679 Personal history of other diseases of the circulatory system: ICD-10-CM

## 2017-01-22 DIAGNOSIS — M961 Postlaminectomy syndrome, not elsewhere classified: ICD-10-CM

## 2017-01-22 DIAGNOSIS — I10 Essential (primary) hypertension: Principal | ICD-10-CM

## 2017-01-22 DIAGNOSIS — Z8614 Personal history of Methicillin resistant Staphylococcus aureus infection: ICD-10-CM

## 2017-01-22 DIAGNOSIS — N183 Chronic kidney disease, stage 3 (moderate): Principal | ICD-10-CM

## 2017-01-22 DIAGNOSIS — J189 Pneumonia, unspecified organism: ICD-10-CM

## 2017-01-22 DIAGNOSIS — E785 Hyperlipidemia, unspecified: ICD-10-CM

## 2017-01-22 DIAGNOSIS — Z9981 Dependence on supplemental oxygen: ICD-10-CM

## 2017-01-22 DIAGNOSIS — R609 Edema, unspecified: ICD-10-CM

## 2017-01-22 DIAGNOSIS — R05 Cough: ICD-10-CM

## 2017-01-22 DIAGNOSIS — J449 Chronic obstructive pulmonary disease, unspecified: ICD-10-CM

## 2017-01-22 DIAGNOSIS — I429 Cardiomyopathy, unspecified: ICD-10-CM

## 2017-01-22 DIAGNOSIS — R32 Unspecified urinary incontinence: ICD-10-CM

## 2017-01-22 DIAGNOSIS — Z125 Encounter for screening for malignant neoplasm of prostate: ICD-10-CM

## 2017-01-22 DIAGNOSIS — G4730 Sleep apnea, unspecified: ICD-10-CM

## 2017-01-22 DIAGNOSIS — K219 Gastro-esophageal reflux disease without esophagitis: ICD-10-CM

## 2017-01-22 DIAGNOSIS — I5042 Chronic combined systolic (congestive) and diastolic (congestive) heart failure: ICD-10-CM

## 2017-01-22 DIAGNOSIS — J479 Bronchiectasis, uncomplicated: ICD-10-CM

## 2017-01-22 DIAGNOSIS — Z95828 Presence of other vascular implants and grafts: ICD-10-CM

## 2017-01-22 DIAGNOSIS — M954 Acquired deformity of chest and rib: ICD-10-CM

## 2017-01-22 DIAGNOSIS — E669 Obesity, unspecified: ICD-10-CM

## 2017-01-22 DIAGNOSIS — I714 Abdominal aortic aneurysm, without rupture: ICD-10-CM

## 2017-01-22 DIAGNOSIS — L03116 Cellulitis of left lower limb: ICD-10-CM

## 2017-01-22 DIAGNOSIS — R0600 Dyspnea, unspecified: ICD-10-CM

## 2017-01-22 DIAGNOSIS — I509 Heart failure, unspecified: ICD-10-CM

## 2017-01-22 LAB — BASIC METABOLIC PANEL
Lab: 1.2 mg/dL (ref 0.4–1.24)
Lab: 138 MMOL/L (ref 137–147)
Lab: 156 mg/dL — ABNORMAL HIGH (ref 70–100)
Lab: 23 mg/dL (ref 7–25)
Lab: 28 MMOL/L (ref 21–30)
Lab: 3.9 MMOL/L (ref 3.5–5.1)
Lab: 57 mL/min — ABNORMAL LOW (ref >60)
Lab: 9.5 mg/dL (ref 8.5–10.6)
Lab: >60 mL/min (ref >60)
Lab: 10 (ref 3–12)

## 2017-01-22 LAB — PSA SCREEN: Lab: 9.1 ng/mL — ABNORMAL HIGH (ref <6.01)

## 2017-01-22 MED ORDER — TAMSULOSIN 0.4 MG PO CAP
0.4 mg | ORAL_CAPSULE | Freq: Every day | ORAL | 3 refills | 90.00000 days | Status: AC
Start: 2017-01-22 — End: 2017-10-29

## 2017-01-22 NOTE — Progress Notes
PVR: 71mL

## 2017-01-23 NOTE — Progress Notes
MEDICAL STUDENT PROGRESS NOTE    Date of Service: 01/22/2017    Ruben Wood. is a 76 y.o. male.    Subjective:          History of Present Illness  Mr. Ruben Wood is a pleasant 76 year old gentleman that presents today at the clinic for painful urination, weak stream and having sensation of incomplete bladder emptying after voiding. Patient states that he has had these symptoms for several years and they have progressively worsened over time, he has not tried taking any medications for these symptoms in the past as they did not use to bother him as much. Patient states he is now having accidents more frequently with sneezing, coughing or laughing and says sometimes he cannot make it to the bathroom on time, yet UUI is worse. Patient states that he is using the bathroom every 15 minutes. Patient denies any blood in urine. Patient denies any history of kidney stones, falls, pelvic trauma, denies any history of UTIs. Patient endorses constipation. Patient has a 80 pack year smoking history and has had significant asbestos exposure in the past. Patient states that his PSA three years ago was elevated but he did not pursue further medical care for it.    Drinks 1 cup coffee daily. No family hx of gu malignancies    Review of Systems   Constitutional: Positive for fatigue. Negative for chills, fever and unexpected weight change.   HENT: Positive for congestion and hearing loss.    Respiratory: Positive for cough and shortness of breath.    Cardiovascular: Positive for leg swelling.   Gastrointestinal: Negative.    Endocrine: Positive for polyuria.   Genitourinary: Positive for difficulty urinating, dysuria, enuresis, frequency and urgency. Negative for flank pain and hematuria.   Musculoskeletal: Negative.    Skin: Negative.    Allergic/Immunologic: Negative.    Neurological: Positive for light-headedness and numbness.   Hematological: Negative.    Psychiatric/Behavioral: Negative.      Past Medical History: Diagnosis Date   ??? AAA (abdominal aortic aneurysm) (HCC) 10/15/2012   ??? Asthma    ??? Bronchiectasis (HCC)    ??? CAD (coronary artery disease) 08/15/2012    07/11/2002- Cath @ [facility] showed Severe double vessel disease(OMB of circumflex and distal circ)  inferior-basilar dyskinesis.                 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  09/12/12   Cath    ??? Cardiomyopathy (HCC) 07/19/2014   ??? Cellulitis of left lower extremity 11/12/2014    10/2014: admitted with cellulitis, [place] negative for venous clot, MRI without osteomyelitis.    11/12/2014 In clinic today, still with cellulitis.  Per patient and his wife, the erythema may be extending.  Cultures from drainage in hospital with MSSA, but Blood Cx negative.  No e/o osteomyelitis.  My concern is that this antibiotic regimen is not adequately treating his cellulitis.  We will broaden coverage to get MRSA with doxycycline.  Patient and wife given strict call/return criteria.  I will see patient in 3-4 days for re-evaluation.  No fever today.  Plan:  Stop cefpodoxime and start doxycylcine  11/17/2014 Much better, no warmth and erythema minimal.   Plan: continue doxycyline for full 10 day course.      ??? 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 CHF (congestive heart failure) (HCC)    ??? 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 Chronic cough 08/29/2012   ??? 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 CKD (chronic kidney disease) stage 3, GFR 30-59 ml/min (HCC) 08/26/2013   ??? COPD (chronic obstructive pulmonary disease) (HCC) 08/13/2012   ??? Dyspnea    ??? Edema    ??? Essential hypertension 08/13/2012   ??? GERD (gastroesophageal reflux disease) 08/13/2012 ??? History of CHF (congestive heart failure) 08/13/2012   ??? History of MRSA infection 10/14/2012   ??? 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    ??? HTN (hypertension)    ??? Hyperlipemia    ??? Hypertension 08/13/2012   ??? 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 Mixed dyslipidemia 08/26/2013   ??? Morbid obesity (HCC) 08/13/2012   ??? 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  Discussed the importance of diet.  Exercise as tolerated.      ??? Obesity    ??? On supplemental oxygen therapy    ??? OSA on CPAP 08/13/2012   ??? Pneumonia 04/2012    [facility]   ??? Seasonal allergic reaction    ??? Sleep apnea        Past Surgical History:   Procedure Laterality Date   ??? BACK SURGERY  Jan 2016   ??? VARICOSE VEIN SURGERY Left 01/05/2016    left small saphenous endovenous ablation with left leg microphlebectomy-Dr. Junita Push   ??? HX MICROPHLEBECTOMY Right 07/06/2016    [place]   ??? HX ENDOVENOUS ABLATION OF THE SMALL SAPHENOUS VEIN Right 07/06/2016    [place]   ??? BRONCHOSCOPY     ??? CARDIAC CATHERIZATION  several years ago    [place]   ??? DOPPLER ECHOCARDIOGRAPHY     ??? HERNIA REPAIR     ??? HX CHOLECYSTECTOMY     ??? HX HEART CATHETERIZATION     ??? HX LUMBAR LAMINECTOMY     ??? LUNG SURGERY         Social History     Social History   ??? Marital status: Married     Spouse name: [name]   ??? Number of children: 0   ??? Years of education: N/A     Occupational History ??? former Warden/ranger Retired     lives in a 77 year old house with wife     Social History Main Topics   ??? Smoking status: Former Smoker     Packs/day: 2.00     Years: 40.00     Quit date: 07/08/2000   ??? Smokeless tobacco: Never Used   ??? Alcohol use Yes      Comment: on occasion 1-2 drinks/week   ??? Drug use: No   ??? Sexual activity: No     Other  Topics Concern   ??? Not on file     Social History Narrative   ??? No narrative on file       Family History   Problem Relation Age of Onset   ??? Cancer Mother         liver   ??? Stroke Sister    ??? Cancer Father    ??? Coronary Artery Disease Father          Objective:         ??? acetaminophen (TYLENOL) 500 mg tablet Take one tablet by mouth every 6 hours as needed for Pain. Max of 4,000 mg of acetaminophen in 24 hours.   ??? albuterol (PROAIR HFA, VENTOLIN HFA, OR PROVENTIL HFA) 90 mcg/actuation inhaler Inhale two puffs by mouth into the lungs every 4 hours as needed for Wheezing or Shortness of Breath.   ??? albuterol 0.083% (PROVENTIL; VENTOLIN) 2.5 mg /3 mL (0.083 %) nebulizer solution Inhale 3 mL solution by nebulizer as directed every 6 hours as needed for Wheezing or Shortness of Breath.   ??? allopurinol (ZYLOPRIM) 300 mg tablet Take one tablet by mouth daily.   ??? AMITRIPTYLINE HCL (AMITRIPTYLINE/GABAPENTIN/EMU OIL(#)) 05/24/08 % Apply 5 g topically to affected area three times daily.   ??? ascorbic acid (VITAMIN C) 500 mg tablet Take one tablet by mouth daily.   ??? aspirin 325 mg tablet Take 325 mg by mouth daily. Take with food.   ??? atorvastatin (LIPITOR) 20 mg tablet TAKE ONE TABLET BY MOUTH ONCE DAILY   ??? blood sugar diagnostic (GLUCOCARD EXPRESSION) test strip Use one strip as directed before meals and at bedtime.   ??? blood-glucose meter (ACCU-CHEK AVIVA PLUS METER) kit Use  as directed.   ??? budesonide/formoterol (SYMBICORT HFA) 160/4.5 mcg inhalation Inhale two puffs by mouth into the lungs twice daily. Rinse mouth after use. ??? bumetanide (BUMEX) 2 mg tablet Take one tablet by mouth twice daily.   ??? cetirizine (ZYRTEC) 10 mg tablet Take 10 mg by mouth every morning.   ??? cholecalciferol (VITAMIN D-3) 1,000 units tablet Take 1,000 Units by mouth daily.   ??? codeine/guaiFENesin (ROBITUSSIN-AC) 10/100 mg/5 mL oral solution Take 5 mL by mouth every 4 hours as needed for Cough.   ??? duloxetine DR (CYMBALTA) 60 mg capsule Take one capsule by mouth daily.   ??? ferrous sulfate (FEOSOL, FEROSUL) 325 mg (65 mg iron) tablet Take one tablet by mouth daily. Take on empty stomach at least 1 hr before or 2 hrs after food.Take w/ vitamin C 500mg  for better absorption   ??? Flunisolide 25 mcg (0.025 %) spry Apply 2 sprays to each nostril as directed twice daily.   ??? gabapentin (NEURONTIN) 400 mg capsule Take one capsule by mouth every 8 hours.   ??? insulin aspart U-100 (NOVOLOG FLEXPEN U-100 INSULIN) 100 unit/mL injection PEN use sliding scale --max dose of 25 units per day   ??? insulin lispro(+) (HUMALOG KWIKPEN INSULIN) 100 unit/mL injection PEN see additional instructions section for insulin instructions   ??? insulin lispro(+) (HUMALOG KWIKPEN INSULIN) 100 unit/mL injection PEN Use sliding scale --max of 25 units per day   ??? insulin pen needles (disposable) (BD UF NANO PEN NEEDLES) 32 gauge x 5/32 pen needle Use one each as directed as Needed. Use with insulin injections.   ??? ipratropium bromide (ATROVENT) 42 mcg (0.06 %) nasal spray 1 spray to each nostril Q6H PRN   ??? L.ACID/L.CASEI/B.BIF/B.LON/FOS (PROBIOTIC BLEND PO) Take 1 Cap by mouth.   ???  lancets MISC Use one each as directed before meals and at bedtime. Diagnosis: steroid induced hyperglycemia   ??? lutein-zeaxanthin 25-5 mg cap Take 1 capsule by mouth daily.   ??? metoprolol XL (TOPROL XL) 25 mg extended release tablet Take 1 tablet by mouth daily.   ??? montelukast (SINGULAIR) 10 mg tablet Take one tablet by mouth at bedtime daily.   ??? nebulizer compressor medical device Use as directed. ??? nitroglycerin (NITROSTAT) 0.4 mg tablet Place 1 Tab under tongue every 5 minutes as needed for Chest Pain.   ??? pantoprazole DR (PROTONIX) 40 mg tablet Take one tablet by mouth twice daily.   ??? ranitidine(+) (ZANTAC) 150 mg tablet TAKE ONE TABLET BY MOUTH ONCE DAILY   ??? senna/docusate (SENOKOT-S) 8.6/50 mg tablet Take 1 Tab by mouth twice daily. (Patient taking differently: Take 1 tablet by mouth daily as needed.)   ??? sildenafil(+) (VIAGRA) 50 mg tablet Take 1 tablet by mouth as Needed for Erectile dysfunction.   ??? spironolactone (ALDACTONE) 25 mg tablet Take 1 tablet by mouth twice daily.   ??? tamsulosin (FLOMAX) 0.4 mg capsule Take one capsule by mouth daily. Take 30 min after same meal.  Do not cut/ crush/ chew.     Vitals:    01/22/17 1314 01/22/17 1321   BP: (!) 141/106 140/64   Pulse: 77 87   Weight: (!) 141 kg (310 lb 12.8 oz)    Height: 180.3 cm (71)      Body mass index is 43.35 kg/m???.       Physical Exam   Constitutional: He is oriented to person, place, and time. He appears well-developed and well-nourished. No distress.   HENT:   Head: Normocephalic and atraumatic.   Right Ear: External ear normal.   Left Ear: External ear normal.   Eyes: Pupils are equal, round, and reactive to light.   Neck: Normal range of motion. Neck supple. No thyromegaly present.   Cardiovascular: Normal rate.   Pulmonary/Chest: Effort normal. No respiratory distress.   Abdominal: Soft. He exhibits no distension. There is no tenderness.   Genitourinary:   Genitourinary Comments: DRE: 30g, no induration or nodules   Musculoskeletal: Normal range of motion. He exhibits no edema or deformity.   Neurological: He is alert and oriented to person, place, and time.   Skin: Skin is warm and dry.   Psychiatric: He has a normal mood and affect. His behavior is normal.   pvr= 71mL    Assessment and Plan:  Assessment:  - Incontinence (UUI greatest), dysuria, weak stream, feeling of incomplete emptying  - morbid obesity - hx of elevated PSA, never followed    Plan:  - miralax  - PSA  - renal US  - flomax      Windy Kalata, MS3    ATTESTATION    I personally performed or re-performed the history, physical exam and treatment for the E/M. I discussed the case with the Medical Student, and concur with the Medical Student documentation of history, physical exam and treatment plan unless otherwise noted.     Staff name:  Mittie Bodo, MD Date:  01/28/2017

## 2017-01-28 ENCOUNTER — Encounter: Admit: 2017-01-28 | Discharge: 2017-01-28 | Payer: MEDICARE

## 2017-01-28 DIAGNOSIS — I714 Abdominal aortic aneurysm, without rupture: ICD-10-CM

## 2017-01-28 DIAGNOSIS — R609 Edema, unspecified: ICD-10-CM

## 2017-01-28 DIAGNOSIS — J189 Pneumonia, unspecified organism: ICD-10-CM

## 2017-01-28 DIAGNOSIS — I429 Cardiomyopathy, unspecified: ICD-10-CM

## 2017-01-28 DIAGNOSIS — J45909 Unspecified asthma, uncomplicated: ICD-10-CM

## 2017-01-28 DIAGNOSIS — J449 Chronic obstructive pulmonary disease, unspecified: ICD-10-CM

## 2017-01-28 DIAGNOSIS — R05 Cough: ICD-10-CM

## 2017-01-28 DIAGNOSIS — I10 Essential (primary) hypertension: Secondary | ICD-10-CM

## 2017-01-28 DIAGNOSIS — I5042 Chronic combined systolic (congestive) and diastolic (congestive) heart failure: ICD-10-CM

## 2017-01-28 DIAGNOSIS — I2510 Atherosclerotic heart disease of native coronary artery without angina pectoris: Secondary | ICD-10-CM

## 2017-01-28 DIAGNOSIS — E669 Obesity, unspecified: ICD-10-CM

## 2017-01-28 DIAGNOSIS — Z9981 Dependence on supplemental oxygen: ICD-10-CM

## 2017-01-28 DIAGNOSIS — K219 Gastro-esophageal reflux disease without esophagitis: ICD-10-CM

## 2017-01-28 DIAGNOSIS — I509 Heart failure, unspecified: ICD-10-CM

## 2017-01-28 DIAGNOSIS — Z8679 Personal history of other diseases of the circulatory system: ICD-10-CM

## 2017-01-28 DIAGNOSIS — Z8614 Personal history of Methicillin resistant Staphylococcus aureus infection: ICD-10-CM

## 2017-01-28 DIAGNOSIS — E782 Mixed hyperlipidemia: ICD-10-CM

## 2017-01-28 DIAGNOSIS — N183 Chronic kidney disease, stage 3 (moderate): ICD-10-CM

## 2017-01-28 DIAGNOSIS — G4733 Obstructive sleep apnea (adult) (pediatric): ICD-10-CM

## 2017-01-28 DIAGNOSIS — M954 Acquired deformity of chest and rib: ICD-10-CM

## 2017-01-28 DIAGNOSIS — M961 Postlaminectomy syndrome, not elsewhere classified: ICD-10-CM

## 2017-01-28 DIAGNOSIS — L03116 Cellulitis of left lower limb: ICD-10-CM

## 2017-01-28 DIAGNOSIS — J479 Bronchiectasis, uncomplicated: ICD-10-CM

## 2017-01-28 DIAGNOSIS — R0600 Dyspnea, unspecified: ICD-10-CM

## 2017-01-28 DIAGNOSIS — Z95828 Presence of other vascular implants and grafts: ICD-10-CM

## 2017-01-28 DIAGNOSIS — E785 Hyperlipidemia, unspecified: ICD-10-CM

## 2017-01-28 DIAGNOSIS — J302 Other seasonal allergic rhinitis: ICD-10-CM

## 2017-01-28 DIAGNOSIS — G4730 Sleep apnea, unspecified: ICD-10-CM

## 2017-01-28 NOTE — Assessment & Plan Note
-   miralax  - PSA  - renal US  - flomax

## 2017-01-29 ENCOUNTER — Ambulatory Visit: Admit: 2017-01-29 | Discharge: 2017-01-30 | Payer: MEDICARE

## 2017-01-29 ENCOUNTER — Encounter: Admit: 2017-01-29 | Discharge: 2017-01-29 | Payer: MEDICARE

## 2017-01-29 DIAGNOSIS — J449 Chronic obstructive pulmonary disease, unspecified: ICD-10-CM

## 2017-01-29 DIAGNOSIS — J45909 Unspecified asthma, uncomplicated: ICD-10-CM

## 2017-01-29 DIAGNOSIS — N183 Chronic kidney disease, stage 3 (moderate): ICD-10-CM

## 2017-01-29 DIAGNOSIS — J302 Other seasonal allergic rhinitis: ICD-10-CM

## 2017-01-29 DIAGNOSIS — M961 Postlaminectomy syndrome, not elsewhere classified: ICD-10-CM

## 2017-01-29 DIAGNOSIS — I2510 Atherosclerotic heart disease of native coronary artery without angina pectoris: Secondary | ICD-10-CM

## 2017-01-29 DIAGNOSIS — M954 Acquired deformity of chest and rib: ICD-10-CM

## 2017-01-29 DIAGNOSIS — Z8614 Personal history of Methicillin resistant Staphylococcus aureus infection: ICD-10-CM

## 2017-01-29 DIAGNOSIS — R05 Cough: ICD-10-CM

## 2017-01-29 DIAGNOSIS — I5042 Chronic combined systolic (congestive) and diastolic (congestive) heart failure: ICD-10-CM

## 2017-01-29 DIAGNOSIS — I10 Essential (primary) hypertension: Secondary | ICD-10-CM

## 2017-01-29 DIAGNOSIS — E782 Mixed hyperlipidemia: ICD-10-CM

## 2017-01-29 DIAGNOSIS — R0600 Dyspnea, unspecified: ICD-10-CM

## 2017-01-29 DIAGNOSIS — K219 Gastro-esophageal reflux disease without esophagitis: ICD-10-CM

## 2017-01-29 DIAGNOSIS — Z95828 Presence of other vascular implants and grafts: ICD-10-CM

## 2017-01-29 DIAGNOSIS — I714 Abdominal aortic aneurysm, without rupture: ICD-10-CM

## 2017-01-29 DIAGNOSIS — I509 Heart failure, unspecified: ICD-10-CM

## 2017-01-29 DIAGNOSIS — I429 Cardiomyopathy, unspecified: ICD-10-CM

## 2017-01-29 DIAGNOSIS — G4733 Obstructive sleep apnea (adult) (pediatric): ICD-10-CM

## 2017-01-29 DIAGNOSIS — G4730 Sleep apnea, unspecified: ICD-10-CM

## 2017-01-29 DIAGNOSIS — J479 Bronchiectasis, uncomplicated: ICD-10-CM

## 2017-01-29 DIAGNOSIS — L03116 Cellulitis of left lower limb: ICD-10-CM

## 2017-01-29 DIAGNOSIS — Z9981 Dependence on supplemental oxygen: ICD-10-CM

## 2017-01-29 DIAGNOSIS — E785 Hyperlipidemia, unspecified: ICD-10-CM

## 2017-01-29 DIAGNOSIS — J189 Pneumonia, unspecified organism: ICD-10-CM

## 2017-01-29 DIAGNOSIS — Z8679 Personal history of other diseases of the circulatory system: ICD-10-CM

## 2017-01-29 DIAGNOSIS — E669 Obesity, unspecified: ICD-10-CM

## 2017-01-29 DIAGNOSIS — R609 Edema, unspecified: ICD-10-CM

## 2017-01-29 NOTE — Progress Notes
Date of Service: 01/29/2017    Ruben Wood. is a 76 y.o. male.       HPI     Ruben Wood is seen today in a 2???week follow up visit. 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 He had also previously been following with Dr. Vita Barley in the weight reduction clinic but since not following the diet has regained much of the weight.    He was hospitalized September of this year with acute respiratory failure and was positive for rhinovirus treated with supportive therapy and prednisone.  He was then seen by Dr. Jacqulyn Ducking on 12/07/2016 at which time his weight was 306 pounds on his home scale. He continued to have fatigue and generalized weakness which was associated with deconditioning from his September hospitalization. He was continued on Bumex 2 mg twice daily and Spironolactone 25 mg twice daily.      Mr. Denio presents today accompanied by his wife, Ruben Wood.  He reports that approximately 2 weeks ago he developed a productive cough and hoarseness and was treated by Dr. Lambert Mody for respiratory infection with prednisone and Levaquin.  Approximately 2 days ago he redeveloped similar symptoms with a productive cough and hoarseness.  He will be contacting Dr. Lambert Mody today regarding recurrence of the symptoms.  He continues to have dyspnea on exertion which has worsened again with the recent symptoms.  He continues to attend physical therapy approximately 3 times per week.  He reports that he will also be scheduled for a repeat sleep study in the near future.  He was also diagnosed with elevated PSA and undergoing a prostate biopsy in the near future as well.  He reports that his weights at home have trended up slightly with the recent prednisone.  He is frustrated with his weight gain knows that he needs to get back on his low calorie diet.         Vitals:    01/29/17 1116   BP: 128/72   Pulse: 93   SpO2: 96%   Weight: (!) 141.7 kg (312 lb 4.8 oz)   Height: 1.803 m (5' 11)     Body mass index is 43.56 kg/m???.     Past Medical History  Patient Active Problem List    Diagnosis Date Noted   ??? Urinary incontinence 01/28/2017     - Incontinence (UUI greatest), dysuria, weak stream, feeling of incomplete emptying  - morbid obesity  - hx of elevated PSA, never followed     ??? Spondylolisthesis, lumbar region 12/13/2016   ??? Rhinovirus infection 11/09/2016   ??? Acute respiratory failure with hypoxia (HCC) 11/07/2016   ??? Severe sepsis (HCC) 11/07/2016   ??? Lactic acid acidosis 11/07/2016   ??? Hyperglycemia 11/07/2016   ??? Lumbar post-laminectomy syndrome      L4-5     ??? Iron deficiency 10/09/2016   ??? Spinal stenosis of lumbosacral region 09/20/2016   ??? Spondylolisthesis of lumbosacral region 09/20/2016   ??? Lumbar radiculopathy 09/20/2016   ??? Osteoarthritis of spine with radiculopathy, lumbosacral region 09/20/2016   ??? Venous ulcer of left leg (HCC) 09/13/2016   ??? 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 Lower extremity edema 07/13/2016   ??? 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 He had a normal response to pneumococca vaccination; he had 17/23 positive serotypes. He has normal tetanus toxoid Ab and CH50.  His T&B cell panel revealed a mildly low B-cell count at 68 right after a time of acute illness.  He had positive diphtheria antibody.    - We recommend repeating T&B cell panel  - We recommend repeating IgG. ??? Moderate persistent asthma with acute exacerbation 02/25/2016     He has had asthma since sometime in his 20-30s. He gets cough, chest tightness, wheezing, shortness of breath that is year round with worsening in the Spring and Fall. Triggers: URI, hot/cold air. He has a history of colds dropping to the chest. He is currently on Symbicort 160/4.12mcg 2 puff BID but he has lost his spacer. He has had ~5 exacerbations in 2017 and 1 steroid course this year already. Additionally he has been diagnosed with bronchiectasis and emphysema; his PFTs suggest both obstructive and restrictive disease and asthma is unlikely to be the sole cause of his dyspnea.    He has negative IgE immunocaps to aeroallergens which makes the possibility of allergic asthma highly unlikely. His IgE level was 17 and he had negative ANCAs, MPO and PR3.     He has taken 2 days of steroids (prednisone 40mg ), however expired medication. He reports this has improved his shortness of breath.     - We recommend completing 5 day course of prednisone and will prescribe 3 additional days of prednisone 40mg .   - We recommend adhering to Symbicort 160/4.78mcg 2 puff BID with Spacer. He is currently non-compliant with these medications.   - We recommend re-scheduling appointment with Dr. [name] for continued management.   - We will obtain records from Dr. Jasmine Awe office (Pulmonologist in [place], [place]) on whether the patient truly received Xolair.      ??? 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 He is not having issues with urticaria.    - We recommend IgE immunocaps to aeroallergens.      ??? Chronic Rhinoconjunctivitis 02/25/2016     He has had nasal congestion, rhinorrhea, PND, sneezing, itchy/watery eyes since he was a child that is year round and not worsening in seasons. Nasal sprays are not helpful (Flonase, Nasacort) and patient had incorrect technique with sprays with resultant nosebleeds. He takes Zyrtec 10mg  daily and Singulair 10mg  daily. He has had prior IT with Dr. [name] in [place], in [place], and with ENT physician Dr. Ander [name] in [place], [place]. He only found first round of IT helpful. He had a normal CT sinuses 02/2016 and negative IgE immunocaps for aeroallergens 02/2016.     His symptoms are currently uncontrolled. He found many nasal corticosteroids unhelpful including Flonase, Rhinocort, Nasacort. He is currently off of Zyrtec.  He has been noncompliant with his medication regimen and has not been taking the flunisolide nasal spray.    - We recommend starting flunisolide 1 sprays in each nostril once daily. We reviewed technique in clinic.   - We recommend continuing Zyrtec 10mg  daily and continuing singulair 10mg  daily. His most recent eGFR is 69. If this changes in the future, his anti-histamine dose may need to be adjusted.     ??? Peripheral eosinophilia 02/25/2016  AEC 500 in 10/2014. Etiologies include likely atopic disease, but other considerations include vasculitidies such as EGPA. Less likely parasitic infection as no bothersome GI symptoms or serpiginous rashes.    - We recommend repeating CBC w/ diff.   - We will obtain ANCAs     ??? Recurrent infections 02/25/2016     He has had 5-10 rounds of antibiotics per year for sinus infections and bronchitis. He was previously having recurrent pneumonias (3-4 times a year for 5 years) however not as of recently. Additionally he has had cellulitis in LE requiring hospitalization and IV Abx. Although these infections could be explained by underlying disease processes (obstructive/restrictive lung diseases and venous stasis requiring vein stripping), he meets the warning signs from [facility] 10 signs of PIDD.    He had mildly low IgG at 636 with normal IgA and IgM. He had positive tetanus toxoid Ab and normal CH50.  He had a normal response to Pneumovax with 17/23+ pneumococcal serotypes.  He had a positive diphtheria antibody. He received Pneumovax in clinic 04/2016 . He received Prevnar in 2016.     - We recommend repeating T&B cell panel  - We recommend repeating IgG.     ??? Postoperative visit 02/23/2016   ??? S/P left pulmonary artery pressure sensor implant placement (CardioMEMs)  02/02/2016     * Patient has CardioMEMs (Pulmonary Artery Pressure Sensor)* Please call Matthew Folks, CardioMEMs Program Coordinator [phone] or page Heart Failure Rounding Team if patient is admitted or presents to ED*      ??? Ischemic cardiomyopathy 01/12/2016   ??? 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 Wound of skin 01/12/2016   ??? 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 Varicose veins of left lower extremity with pain 01/05/2016   ??? 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 Obesity, morbid (HCC) 11/25/2015   ??? Varicose veins of leg with pain, left 11/25/2015   ??? Pain of left lower extremity 11/25/2015   ??? Hypercholesterolemia 11/25/2015   ??? Obesity (BMI 35.0-39.9 without comorbidity) 11/25/2015   ??? 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 Bronchiectasis without complication (HCC) 06/18/2015     -flutter  -non sputum producer     ??? Abnormal cortisol level 12/25/2014   ??? Venous stasis dermatitis of both lower extremities 11/04/2014   ??? Sacroiliac dysfunction 07/28/2014   ??? 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 Chronic combined systolic and diastolic congestive heart failure, NYHA class 2 (HCC) 07/19/2014     11/04/14: EF 20%, severely dilated LV with grade 1 diastolic dysfunction. 11/12/2014 In clinic today, patient appears fairly well compensated.  He does have some LEE, but no significant rales (just some at bases), no JVD sitting upright, and no sx to suggest decompensation.  We will continue metoprolol, spironolactone, and bumex at current doses.    11/17/2014 Edema much improved in lower ext, Cr trending down on last labs.  Will recheck labs today.  Advised to weigh himself daily.  Rechecking BMP today to ensure not overdoing diuretics.     On metoprolol, spironolactone.  Will  need to explore allergy to ARB more as patient may benefit from ACEI.      03/29/14 CardioMEMS implant by Dr. Chales Abrahams  1. Normal cardiac output and cardiac index.  2. Normal pulmonary pressures and normal pulmonary capillary wedge pressure.  3. Successful insertion of CardioMEMS pulmonary artery pressure sensor     ??? Cardiomyopathy (HCC) 07/19/2014       CardioMEMS Primary Screening                                  Date: 02/23/2015        Heart failure admission within last 12 months                  Date: 11/03/14 Yes        NYHA HF Class III Yes        Able to take dual antiplatelet or anticoagulants for 1 month post implant Yes   Additional Screening if all above are yes        Patient with active infection No        History of recurrent PE or DVT No        Unable to tolerate RHC No        GFR < 25 mL/min and non-responsive to diuretics or on chronic renal dialysis No        Congenital heart disease or mechanical right heart valve No        CRT device implanted within last 3 months?          Date of Implant:  No        BMI > 35, axillary chest circumference > 165 cm                       140 cm  No        Unwilling to transmit device date or concerns with patient compliance No        ??? 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      ??? CKD (chronic kidney disease) stage 3, GFR 30-59 ml/min (HCC) 08/26/2013   ??? Mixed dyslipidemia 08/26/2013     Hepatic Function    Lab Results   Component Value Date/Time    ALBUMIN 4.0 11/04/2014 12:26 AM    TOTAL PROTEIN 6.9 11/04/2014 12:26 AM    ALK PHOSPHATASE 61 11/04/2014 12:26 AM    Lab Results   Component Value Date/Time    AST (SGOT) 41* 11/04/2014 12:26 AM    ALT (SGPT) 19 11/04/2014 12:26 AM    TOTAL BILIRUBIN 1.4* 11/04/2014 12:26 AM        Lab Results   Component Value Date    CHOL 148 12/15/2013    TRIG 122 12/15/2013    HDL 51 12/15/2013    LDL 88 12/15/2013    VLDL 24 12/15/2013    NONHDLCHOL 97 12/15/2013       BP Readings from Last 3 Encounters:   11/12/14 129/80   11/09/14 111/44   11/04/14 146/65  Plan:   76 y.o. with known CAD with CTO of RCA and obtuse marginal branch with high grade stenosis (90%) in mid left circ.    Advised heart healthy diet and daily exercise.    Continue high intensity statin, atorvastatin       ??? AAA (abdominal aortic aneurysm) (HCC) 10/15/2012     Duplex done at OSH in 2007 and 2008 ~ 4.1 cm    Duplex 10/15/12-AAA 7.3 cm AP x 7.3 cm       ??? History of MRSA infection 10/14/2012   ??? Chronic cough 08/29/2012   ??? CAD (coronary artery disease) 08/15/2012     07/11/2002- Cath @ [facility] showed Severe double vessel disease(OMB of circumflex and distal circ)  inferior-basilar dyskinesis.                  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    09/12/12   Cath - [place]:  CTO of the right coronary artery, CTO of the obtuse marginal branch and high-grade stenosis of   90% in the mid left circumflex artery No significant stenosis in the left anterior descending artery or left main vessel     Failed attempt in opening 2nd obtuse marginal CTO as described above.    10/14/12: Unsuccessful attempt to open CTO of OMB and mid-CFX by Dr. Mackey Birchwood         ??? OSA on CPAP 08/13/2012     CPAP at  DME: [facility]     ??? GERD (gastroesophageal reflux disease) 08/13/2012     -PPI  -follows with GI     ??? 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  Discussed the importance of diet.  Exercise as tolerated.        ??? Essential hypertension 08/13/2012   ??? Chest wall deformity 07/09/2012     Chest wall reconstruction on 07/09/12  Lung herniation- bio bridge           Review of Systems   Constitution: Negative.   HENT: Negative.    Eyes: Negative.    Cardiovascular: Negative.    Respiratory: Negative.    Endocrine: Negative.    Hematologic/Lymphatic: Negative.    Skin: Negative.    Musculoskeletal: Negative.    Gastrointestinal: Negative.    Genitourinary: Negative.    Neurological: Negative.    Psychiatric/Behavioral: Negative.    Allergic/Immunologic: Negative.        Physical Exam  General Appearance: morbidly obese, no acute distress  Skin: warm & intact  HEENT: EOMI, mucous membranes moist, oropharynx is clear  Neck Veins: neck veins are flat & not distended-difficult to assess given his body habitus  Chest Inspection: chest is normal in appearance, R SC device site   Auscultation/Percussion: lungs clear to auscultation, no rales, rhonchi, or wheezing  Cardiac Rhythm: regular rhythm & normal rate  Cardiac Auscultation: distant heart tones, but otherwise normal S1 & S2, no S3 or S4, no rub Murmurs: no cardiac murmurs ???  Extremities: 1+ L???ankle???and pretibial edema; trace ankle and pretibial???edema in the  right. Several varicose veins. Chronic venous stasis changes.  Abdominal Exam: obese, non-tender, no masses, bowel sounds+, nonpalpable abdominal aorta  Liver & Spleen: difficult to assess due to obese body habitus  Neurologic Exam: oriented to time, place and person; no focal neurologic deficits    Cardiovascular Studies  Echocardiogram 11/07/2016 revealed LVEF 45-50%, normal RV size and function, mild to moderate MR.    CardioMEMs readings        Problems Addressed Today  Encounter Diagnoses   Name Primary?   ??? Chronic combined systolic and diastolic congestive heart failure, NYHA class 2 (HCC) Yes   ??? Ischemic cardiomyopathy    ??? CKD (chronic kidney disease) stage 3, GFR 30-59 ml/min (HCC)        Assessment and Plan     1. Chronic combined systolic and diastolic heart failure???with improved ejection fraction 45-50% on most recent echo 11/07/16 (previously???25% with grade 1 left ventricular diastolic dysfunction, dilated LV at 6.0 cm, and moderate mitral regurgitation on echocardiogram 10/27/2015). ???He does have CardioMEMS PA sensor monitor (placed 04/09/15) with CardioMEMS PAD reading today of 9. Currently has New [place] Heart Association class III symptoms, stage C. ???He will continue with Bumex 2???mg BID, Spironolactone 25 mg twice daily, metoprolol XL 25 mg daily. ???He has been off of losartan for several months due to hypotension and renal dysfunction. ???He will refocus on???low-sodium diet and 2 L fluid restriction.   2. CKD, stage III.  BMP 01/22/17 revealed crt 1.24.  3. Ischemic cardiomyopathy with LVEF as above. ???He will continue metoprolol XL ???25 mg daily, spironolactone as above. He has been off???losartan due to???his renal function.   4. Status post CRT-D placement On 11/25/2015 by Dr. Betti Cruz. ???He continues to have regular schedule device interrogations. ???His last remote interrogation performed 10/15//18 revealed 6 AMS events. ECGs for these show AP BIV with PVC's present and some FFRWOS. Several PMT events all broken by PMT algorithm. 1 VT on 11/21/16 lasting 16 seconds with ATP delivery x1 that broke true VT in the 200's (up to 203 bpm). BiVpacing; 92%.   5. L lower extremity edema and varicose veins. Left lower extremity venous Doppler 5/24 was negative for DVT. He previously has had right lower extremity endovenous radiofrequency ablation of the right small???saphenous vein on 5/17 followed by RLE venous doppler on 5/22. He follows with Dr. Junita Push.    6. Morbid obesity. ???He had lost a significant amount of weight through the???[facility] directed by Dr. Vita Barley. ???Unfortunately he regained quite a bit of this weight after going off the program last November 2017. ???He and his wife report that they will restart the program soon. His exercise is limited by his back pain.  7. Infrarenal abdominal aortic aneurysm, post endovascular repair, September of 2014.??? He follows with Dr. Chales Abrahams and was last seen in 07/27/16 with CT of the abdomen that same day reviewed by Dr. Chales Abrahams and revealed aneurysm is currently measuring 5.4 cm in maximum dimension, previously 5.8 cm.???He will see him back in 1 year with a repeat noncontrast CT of the abdomen and pelvis.  8. Recent treatment for respiratory infection.  He reports a reoccurrence of his symptoms of productive cough and hoarseness that he experienced approximately 2 weeks ago for which he received prednisone and Levaquin.  I recommended he follow-up with Dr. Lambert Mody regarding reoccurrence of his symptoms.  ???  Mr. Hoehn will follow-up appointment with Dr. Jacqulyn Ducking on 2/21 with a BMP at the time of the visit. ???  He has been encouraged to contact us prior with any questions, concerns, or increasing symptoms.  ???  Thank you for allowing me to participate in the care of this patient. If you have any questions please do not hesitate to contact our office.  ???  ???Collaborating physician:  AJS         Current Medications (including today's revisions)  ??? acetaminophen (TYLENOL) 500 mg tablet Take one tablet by mouth every 6 hours as needed for Pain. Max of 4,000 mg of acetaminophen in 24 hours.   ??? albuterol (PROAIR HFA, VENTOLIN HFA, OR PROVENTIL HFA) 90 mcg/actuation inhaler Inhale two puffs by mouth into the lungs every 4 hours as needed for Wheezing or Shortness of Breath.   ??? albuterol 0.083% (PROVENTIL; VENTOLIN) 2.5 mg /3 mL (0.083 %) nebulizer solution Inhale 3 mL solution by nebulizer as directed every 6 hours as needed for Wheezing or Shortness of Breath.   ??? allopurinol (ZYLOPRIM) 300 mg tablet Take one tablet by mouth daily.   ??? AMITRIPTYLINE HCL (AMITRIPTYLINE/GABAPENTIN/EMU OIL(#)) 05/24/08 % Apply 5 g topically to affected area three times daily.   ??? ascorbic acid (VITAMIN C) 500 mg tablet Take one tablet by mouth daily.   ??? aspirin 325 mg tablet Take 325 mg by mouth daily. Take with food.   ??? atorvastatin (LIPITOR) 20 mg tablet TAKE ONE TABLET BY MOUTH ONCE DAILY   ??? blood sugar diagnostic (GLUCOCARD EXPRESSION) test strip Use one strip as directed before meals and at bedtime.   ??? blood-glucose meter (ACCU-CHEK AVIVA PLUS METER) kit Use  as directed.   ??? budesonide/formoterol (SYMBICORT HFA) 160/4.5 mcg inhalation Inhale two puffs by mouth into the lungs twice daily. Rinse mouth after use.   ??? bumetanide (BUMEX) 2 mg tablet Take one tablet by mouth twice daily.   ??? cetirizine (ZYRTEC) 10 mg tablet Take 10 mg by mouth every morning.   ??? cholecalciferol (VITAMIN D-3) 1,000 units tablet Take 1,000 Units by mouth daily.   ??? codeine/guaiFENesin (ROBITUSSIN-AC) 10/100 mg/5 mL oral solution Take 5 mL by mouth every 4 hours as needed for Cough.   ??? duloxetine DR (CYMBALTA) 60 mg capsule Take one capsule by mouth daily.   ??? ferrous sulfate (FEOSOL, FEROSUL) 325 mg (65 mg iron) tablet Take one tablet by mouth daily. Take on empty stomach at least 1 hr before or 2 hrs after food.Take w/ vitamin C 500mg  for better absorption   ??? Flunisolide 25 mcg (0.025 %) spry Apply 2 sprays to each nostril as directed twice daily.   ??? gabapentin (NEURONTIN) 400 mg capsule Take one capsule by mouth every 8 hours.   ??? insulin aspart U-100 (NOVOLOG FLEXPEN U-100 INSULIN) 100 unit/mL injection PEN use sliding scale --max dose of 25 units per day   ??? insulin lispro(+) (HUMALOG KWIKPEN INSULIN) 100 unit/mL injection PEN see additional instructions section for insulin instructions   ??? insulin lispro(+) (HUMALOG KWIKPEN INSULIN) 100 unit/mL injection PEN Use sliding scale --max of 25 units per day   ??? insulin pen needles (disposable) (BD UF NANO PEN NEEDLES) 32 gauge x 5/32 pen needle Use one each as directed as Needed. Use with insulin injections.   ??? ipratropium bromide (ATROVENT) 42 mcg (0.06 %) nasal spray 1 spray to each nostril Q6H PRN   ??? L.ACID/L.CASEI/B.BIF/B.LON/FOS (PROBIOTIC BLEND PO) Take 1 Cap by mouth.   ??? lancets MISC Use one each as directed before meals and at bedtime. Diagnosis: steroid induced hyperglycemia   ??? lutein-zeaxanthin  25-5 mg cap Take 1 capsule by mouth daily.   ??? metoprolol XL (TOPROL XL) 25 mg extended release tablet Take 1 tablet by mouth daily.   ??? montelukast (SINGULAIR) 10 mg tablet Take one tablet by mouth at bedtime daily.   ??? nebulizer compressor medical device Use as directed.   ??? nitroglycerin (NITROSTAT) 0.4 mg tablet Place 1 Tab under tongue every 5 minutes as needed for Chest Pain.   ??? pantoprazole DR (PROTONIX) 40 mg tablet Take one tablet by mouth twice daily.   ??? ranitidine(+) (ZANTAC) 150 mg tablet TAKE ONE TABLET BY MOUTH ONCE DAILY   ??? senna/docusate (SENOKOT-S) 8.6/50 mg tablet Take 1 Tab by mouth twice daily. (Patient taking differently: Take 1 tablet by mouth daily as needed.)   ??? sildenafil(+) (VIAGRA) 50 mg tablet Take 1 tablet by mouth as Needed for Erectile dysfunction.   ??? spironolactone (ALDACTONE) 25 mg tablet Take 1 tablet by mouth twice daily.   ??? tamsulosin (FLOMAX) 0.4 mg capsule Take one capsule by mouth daily. Take 30 min after same meal.  Do not cut/ crush/ chew.

## 2017-01-30 DIAGNOSIS — I5042 Chronic combined systolic (congestive) and diastolic (congestive) heart failure: Principal | ICD-10-CM

## 2017-01-30 DIAGNOSIS — N183 Chronic kidney disease, stage 3 (moderate): ICD-10-CM

## 2017-01-30 DIAGNOSIS — I255 Ischemic cardiomyopathy: ICD-10-CM

## 2017-02-05 ENCOUNTER — Encounter: Admit: 2017-02-05 | Discharge: 2017-02-05 | Payer: MEDICARE

## 2017-02-05 MED ORDER — METOPROLOL SUCCINATE 25 MG PO TB24
ORAL_TABLET | Freq: Every day | ORAL | 3 refills | 90.00000 days | Status: AC
Start: 2017-02-05 — End: 2017-11-19

## 2017-02-06 ENCOUNTER — Encounter: Admit: 2017-02-06 | Discharge: 2017-02-06 | Payer: MEDICARE

## 2017-02-06 MED ORDER — ALLOPURINOL 300 MG PO TAB
ORAL_TABLET | Freq: Every day | ORAL | 1 refills | 60.00000 days | Status: AC
Start: 2017-02-06 — End: 2018-02-12

## 2017-02-06 NOTE — Telephone Encounter
Pharmacy faxed refill request on allopurinol. It is a Standing order and the patient meets the Standing order requirements. I faxed the request back to the pharmacy. Lynda Wanninger, RN

## 2017-02-06 NOTE — Progress Notes
Called patient to discuss Care management program to see if interested in enrollment. Patient confirmed and is now enrolled.  HCC score 5.904.  Discussed weight loss. Patient was part of [facility], but dropped out due to location distance.  Patient states will try to improve diet.  Patient primarily exercises during physical therapy.  Discussed need to try and increase exercising outside of physical therapy.  Patient agreeable. Will mail patient exercise tips.  Discussed patient medication list.  Updated. Patient states he was advised no longer need to check glucose nor be on insulin; has not been on insulin for some time now.  Removed from med list.  Discussed that patient is currently on aspirin 325mg  instead of 81mg . Patient stated that he is part of a study that is researching aspirin 81mg  vs 325mg .  Patient stated he was told he would receive a phone call from research, but never heard back.  This RN advised would look into it.   Patient states had referral to Sleep Study placed, was called to schedule, but was not home and asked for call back but did not receive a call back.  This RN provided patient with sleep clinic number.  Discussed goals again and will do follow up phone call in one month, Tuesday, January 15th.  Patient stated time of day is not important.  Vedia Pereyra, RN

## 2017-02-06 NOTE — Telephone Encounter
During CCM enrollment and reviewing med list. Patient is taking aspirin 325mg  daily.  Patient advised he is part of a study through [facility] in researching taking aspirin 81mg  vs 325mg  daily.  Patient states "never heard back from them."  This RN searched patient chart.  Unable to locate those conducting this.  Medication list was updated during appointment with Rehab 10/11/16, when aspirin 81mg  was removed and aspirin 325mg  was added.  This RN unable to locate notes discussing this.  Patient inquiring if and when he will receive a call regarding this.  This RN advised will "look into this and worst case will discuss on CCM follow up call 03/06/17."  Patient verbalized understanding and is agreeable to plan.    Please assist.  Vedia Pereyra, RN

## 2017-02-14 ENCOUNTER — Ambulatory Visit: Admit: 2017-02-14 | Discharge: 2017-02-15 | Payer: MEDICARE

## 2017-02-15 DIAGNOSIS — I5042 Chronic combined systolic (congestive) and diastolic (congestive) heart failure: Principal | ICD-10-CM

## 2017-02-22 ENCOUNTER — Encounter: Admit: 2017-02-22 | Discharge: 2017-02-22 | Payer: MEDICARE

## 2017-02-22 ENCOUNTER — Ambulatory Visit: Admit: 2017-02-22 | Discharge: 2017-02-22 | Payer: MEDICARE

## 2017-02-22 DIAGNOSIS — I2510 Atherosclerotic heart disease of native coronary artery without angina pectoris: ICD-10-CM

## 2017-02-22 DIAGNOSIS — E785 Hyperlipidemia, unspecified: ICD-10-CM

## 2017-02-22 DIAGNOSIS — G4730 Sleep apnea, unspecified: ICD-10-CM

## 2017-02-22 DIAGNOSIS — E782 Mixed hyperlipidemia: ICD-10-CM

## 2017-02-22 DIAGNOSIS — I10 Essential (primary) hypertension: ICD-10-CM

## 2017-02-22 DIAGNOSIS — Z8614 Personal history of Methicillin resistant Staphylococcus aureus infection: ICD-10-CM

## 2017-02-22 DIAGNOSIS — M954 Acquired deformity of chest and rib: ICD-10-CM

## 2017-02-22 DIAGNOSIS — Z95828 Presence of other vascular implants and grafts: ICD-10-CM

## 2017-02-22 DIAGNOSIS — I714 Abdominal aortic aneurysm, without rupture: ICD-10-CM

## 2017-02-22 DIAGNOSIS — F331 Major depressive disorder, recurrent, moderate: ICD-10-CM

## 2017-02-22 DIAGNOSIS — J449 Chronic obstructive pulmonary disease, unspecified: ICD-10-CM

## 2017-02-22 DIAGNOSIS — I509 Heart failure, unspecified: ICD-10-CM

## 2017-02-22 DIAGNOSIS — Z8679 Personal history of other diseases of the circulatory system: ICD-10-CM

## 2017-02-22 DIAGNOSIS — R609 Edema, unspecified: ICD-10-CM

## 2017-02-22 DIAGNOSIS — J479 Bronchiectasis, uncomplicated: ICD-10-CM

## 2017-02-22 DIAGNOSIS — J45909 Unspecified asthma, uncomplicated: ICD-10-CM

## 2017-02-22 DIAGNOSIS — R0600 Dyspnea, unspecified: ICD-10-CM

## 2017-02-22 DIAGNOSIS — G4733 Obstructive sleep apnea (adult) (pediatric): ICD-10-CM

## 2017-02-22 DIAGNOSIS — E669 Obesity, unspecified: ICD-10-CM

## 2017-02-22 DIAGNOSIS — I5042 Chronic combined systolic (congestive) and diastolic (congestive) heart failure: Principal | ICD-10-CM

## 2017-02-22 DIAGNOSIS — R05 Cough: ICD-10-CM

## 2017-02-22 DIAGNOSIS — J302 Other seasonal allergic rhinitis: ICD-10-CM

## 2017-02-22 DIAGNOSIS — J189 Pneumonia, unspecified organism: ICD-10-CM

## 2017-02-22 DIAGNOSIS — N183 Chronic kidney disease, stage 3 (moderate): ICD-10-CM

## 2017-02-22 DIAGNOSIS — M961 Postlaminectomy syndrome, not elsewhere classified: ICD-10-CM

## 2017-02-22 DIAGNOSIS — L03116 Cellulitis of left lower limb: ICD-10-CM

## 2017-02-22 DIAGNOSIS — I429 Cardiomyopathy, unspecified: ICD-10-CM

## 2017-02-22 DIAGNOSIS — K219 Gastro-esophageal reflux disease without esophagitis: ICD-10-CM

## 2017-02-22 DIAGNOSIS — Z9981 Dependence on supplemental oxygen: ICD-10-CM

## 2017-02-22 MED ORDER — SODIUM PHOSPHATES 19-7 GRAM/118 ML RE ENEM
1 | ENEMA | Freq: Once | RECTAL | 0 refills | Status: AC
Start: 2017-02-22 — End: ?

## 2017-02-22 MED ORDER — CIPROFLOXACIN HCL 500 MG PO TAB
500 mg | ORAL_TABLET | Freq: Two times a day (BID) | ORAL | 0 refills | 10.00000 days | Status: AC
Start: 2017-02-22 — End: ?

## 2017-02-22 MED ORDER — ESCITALOPRAM OXALATE 10 MG PO TAB
10 mg | ORAL_TABLET | Freq: Every day | ORAL | 3 refills | Status: SS
Start: 2017-02-22 — End: 2017-03-29

## 2017-02-28 ENCOUNTER — Ambulatory Visit: Admit: 2017-02-28 | Discharge: 2017-02-28 | Payer: MEDICARE

## 2017-02-28 ENCOUNTER — Encounter: Admit: 2017-02-28 | Discharge: 2017-02-28 | Payer: MEDICARE

## 2017-02-28 DIAGNOSIS — G4733 Obstructive sleep apnea (adult) (pediatric): ICD-10-CM

## 2017-02-28 DIAGNOSIS — I5042 Chronic combined systolic (congestive) and diastolic (congestive) heart failure: ICD-10-CM

## 2017-02-28 DIAGNOSIS — I509 Heart failure, unspecified: ICD-10-CM

## 2017-02-28 DIAGNOSIS — B999 Unspecified infectious disease: ICD-10-CM

## 2017-02-28 DIAGNOSIS — E782 Mixed hyperlipidemia: ICD-10-CM

## 2017-02-28 DIAGNOSIS — Z8679 Personal history of other diseases of the circulatory system: ICD-10-CM

## 2017-02-28 DIAGNOSIS — R609 Edema, unspecified: ICD-10-CM

## 2017-02-28 DIAGNOSIS — Z8614 Personal history of Methicillin resistant Staphylococcus aureus infection: ICD-10-CM

## 2017-02-28 DIAGNOSIS — R0600 Dyspnea, unspecified: ICD-10-CM

## 2017-02-28 DIAGNOSIS — Z9981 Dependence on supplemental oxygen: ICD-10-CM

## 2017-02-28 DIAGNOSIS — I714 Abdominal aortic aneurysm, without rupture: ICD-10-CM

## 2017-02-28 DIAGNOSIS — E785 Hyperlipidemia, unspecified: ICD-10-CM

## 2017-02-28 DIAGNOSIS — M954 Acquired deformity of chest and rib: ICD-10-CM

## 2017-02-28 DIAGNOSIS — J302 Other seasonal allergic rhinitis: ICD-10-CM

## 2017-02-28 DIAGNOSIS — I429 Cardiomyopathy, unspecified: ICD-10-CM

## 2017-02-28 DIAGNOSIS — D801 Nonfamilial hypogammaglobulinemia: Principal | ICD-10-CM

## 2017-02-28 DIAGNOSIS — J449 Chronic obstructive pulmonary disease, unspecified: ICD-10-CM

## 2017-02-28 DIAGNOSIS — K219 Gastro-esophageal reflux disease without esophagitis: ICD-10-CM

## 2017-02-28 DIAGNOSIS — J45909 Unspecified asthma, uncomplicated: ICD-10-CM

## 2017-02-28 DIAGNOSIS — I2510 Atherosclerotic heart disease of native coronary artery without angina pectoris: ICD-10-CM

## 2017-02-28 DIAGNOSIS — R05 Cough: ICD-10-CM

## 2017-02-28 DIAGNOSIS — L03116 Cellulitis of left lower limb: ICD-10-CM

## 2017-02-28 DIAGNOSIS — J189 Pneumonia, unspecified organism: ICD-10-CM

## 2017-02-28 DIAGNOSIS — Z95828 Presence of other vascular implants and grafts: ICD-10-CM

## 2017-02-28 DIAGNOSIS — G4730 Sleep apnea, unspecified: ICD-10-CM

## 2017-02-28 DIAGNOSIS — I10 Essential (primary) hypertension: Secondary | ICD-10-CM

## 2017-02-28 DIAGNOSIS — N183 Chronic kidney disease, stage 3 (moderate): ICD-10-CM

## 2017-02-28 DIAGNOSIS — E669 Obesity, unspecified: ICD-10-CM

## 2017-02-28 DIAGNOSIS — M961 Postlaminectomy syndrome, not elsewhere classified: ICD-10-CM

## 2017-02-28 DIAGNOSIS — J479 Bronchiectasis, uncomplicated: ICD-10-CM

## 2017-02-28 DIAGNOSIS — J310 Chronic rhinitis: ICD-10-CM

## 2017-02-28 LAB — IMMUNOGLOBULINS-IGA,IGG,IGM
Lab: 120 mg/dL (ref 38–328)
Lab: 154 mg/dL (ref 70–390)
Lab: 531 mg/dL — ABNORMAL LOW (ref 762–1488)

## 2017-02-28 MED ORDER — OLOPATADINE 0.7 % OP DROP
1 [drp] | Freq: Every day | OPHTHALMIC | 2 refills | 27.50000 days | Status: AC
Start: 2017-02-28 — End: 2017-04-13

## 2017-03-01 ENCOUNTER — Encounter: Admit: 2017-03-01 | Discharge: 2017-03-01 | Payer: MEDICARE

## 2017-03-01 ENCOUNTER — Ambulatory Visit: Admit: 2017-03-01 | Discharge: 2017-03-01 | Payer: MEDICARE

## 2017-03-01 DIAGNOSIS — Z8614 Personal history of Methicillin resistant Staphylococcus aureus infection: ICD-10-CM

## 2017-03-01 DIAGNOSIS — J45909 Unspecified asthma, uncomplicated: ICD-10-CM

## 2017-03-01 DIAGNOSIS — R609 Edema, unspecified: ICD-10-CM

## 2017-03-01 DIAGNOSIS — G4733 Obstructive sleep apnea (adult) (pediatric): ICD-10-CM

## 2017-03-01 DIAGNOSIS — I714 Abdominal aortic aneurysm, without rupture: ICD-10-CM

## 2017-03-01 DIAGNOSIS — L03116 Cellulitis of left lower limb: ICD-10-CM

## 2017-03-01 DIAGNOSIS — R0600 Dyspnea, unspecified: ICD-10-CM

## 2017-03-01 DIAGNOSIS — Z8679 Personal history of other diseases of the circulatory system: ICD-10-CM

## 2017-03-01 DIAGNOSIS — K219 Gastro-esophageal reflux disease without esophagitis: ICD-10-CM

## 2017-03-01 DIAGNOSIS — M961 Postlaminectomy syndrome, not elsewhere classified: ICD-10-CM

## 2017-03-01 DIAGNOSIS — M954 Acquired deformity of chest and rib: ICD-10-CM

## 2017-03-01 DIAGNOSIS — E785 Hyperlipidemia, unspecified: ICD-10-CM

## 2017-03-01 DIAGNOSIS — J449 Chronic obstructive pulmonary disease, unspecified: ICD-10-CM

## 2017-03-01 DIAGNOSIS — N183 Chronic kidney disease, stage 3 (moderate): ICD-10-CM

## 2017-03-01 DIAGNOSIS — I5042 Chronic combined systolic (congestive) and diastolic (congestive) heart failure: ICD-10-CM

## 2017-03-01 DIAGNOSIS — I429 Cardiomyopathy, unspecified: ICD-10-CM

## 2017-03-01 DIAGNOSIS — Z9981 Dependence on supplemental oxygen: ICD-10-CM

## 2017-03-01 DIAGNOSIS — R972 Elevated prostate specific antigen [PSA]: Principal | ICD-10-CM

## 2017-03-01 DIAGNOSIS — J479 Bronchiectasis, uncomplicated: ICD-10-CM

## 2017-03-01 DIAGNOSIS — I2510 Atherosclerotic heart disease of native coronary artery without angina pectoris: Secondary | ICD-10-CM

## 2017-03-01 DIAGNOSIS — I10 Essential (primary) hypertension: Secondary | ICD-10-CM

## 2017-03-01 DIAGNOSIS — R05 Cough: ICD-10-CM

## 2017-03-01 DIAGNOSIS — Z95828 Presence of other vascular implants and grafts: ICD-10-CM

## 2017-03-01 DIAGNOSIS — I509 Heart failure, unspecified: ICD-10-CM

## 2017-03-01 DIAGNOSIS — G4730 Sleep apnea, unspecified: ICD-10-CM

## 2017-03-01 DIAGNOSIS — J302 Other seasonal allergic rhinitis: ICD-10-CM

## 2017-03-01 DIAGNOSIS — E669 Obesity, unspecified: ICD-10-CM

## 2017-03-01 DIAGNOSIS — E782 Mixed hyperlipidemia: ICD-10-CM

## 2017-03-01 DIAGNOSIS — J189 Pneumonia, unspecified organism: ICD-10-CM

## 2017-03-01 MED ORDER — GENTAMICIN 40 MG/ML IJ SOLN
80 mg | Freq: Once | INTRAMUSCULAR | 0 refills | Status: CP
Start: 2017-03-01 — End: ?

## 2017-03-04 ENCOUNTER — Encounter: Admit: 2017-03-04 | Discharge: 2017-03-04 | Payer: MEDICARE

## 2017-03-04 DIAGNOSIS — E669 Obesity, unspecified: ICD-10-CM

## 2017-03-04 DIAGNOSIS — G4730 Sleep apnea, unspecified: ICD-10-CM

## 2017-03-04 DIAGNOSIS — R0600 Dyspnea, unspecified: ICD-10-CM

## 2017-03-04 DIAGNOSIS — J479 Bronchiectasis, uncomplicated: ICD-10-CM

## 2017-03-04 DIAGNOSIS — M961 Postlaminectomy syndrome, not elsewhere classified: ICD-10-CM

## 2017-03-04 DIAGNOSIS — J449 Chronic obstructive pulmonary disease, unspecified: ICD-10-CM

## 2017-03-04 DIAGNOSIS — J302 Other seasonal allergic rhinitis: ICD-10-CM

## 2017-03-04 DIAGNOSIS — Z8679 Personal history of other diseases of the circulatory system: ICD-10-CM

## 2017-03-04 DIAGNOSIS — R609 Edema, unspecified: ICD-10-CM

## 2017-03-04 DIAGNOSIS — Z95828 Presence of other vascular implants and grafts: ICD-10-CM

## 2017-03-04 DIAGNOSIS — J45909 Unspecified asthma, uncomplicated: ICD-10-CM

## 2017-03-04 DIAGNOSIS — Z8614 Personal history of Methicillin resistant Staphylococcus aureus infection: ICD-10-CM

## 2017-03-04 DIAGNOSIS — E785 Hyperlipidemia, unspecified: ICD-10-CM

## 2017-03-04 DIAGNOSIS — M954 Acquired deformity of chest and rib: ICD-10-CM

## 2017-03-04 DIAGNOSIS — I10 Essential (primary) hypertension: ICD-10-CM

## 2017-03-04 DIAGNOSIS — K219 Gastro-esophageal reflux disease without esophagitis: ICD-10-CM

## 2017-03-04 DIAGNOSIS — L03116 Cellulitis of left lower limb: ICD-10-CM

## 2017-03-04 DIAGNOSIS — I714 Abdominal aortic aneurysm, without rupture: ICD-10-CM

## 2017-03-04 DIAGNOSIS — E782 Mixed hyperlipidemia: ICD-10-CM

## 2017-03-04 DIAGNOSIS — I5042 Chronic combined systolic (congestive) and diastolic (congestive) heart failure: ICD-10-CM

## 2017-03-04 DIAGNOSIS — Z9981 Dependence on supplemental oxygen: ICD-10-CM

## 2017-03-04 DIAGNOSIS — R05 Cough: ICD-10-CM

## 2017-03-04 DIAGNOSIS — I2510 Atherosclerotic heart disease of native coronary artery without angina pectoris: ICD-10-CM

## 2017-03-04 DIAGNOSIS — J189 Pneumonia, unspecified organism: ICD-10-CM

## 2017-03-04 DIAGNOSIS — I509 Heart failure, unspecified: ICD-10-CM

## 2017-03-04 DIAGNOSIS — G4733 Obstructive sleep apnea (adult) (pediatric): ICD-10-CM

## 2017-03-04 DIAGNOSIS — I429 Cardiomyopathy, unspecified: ICD-10-CM

## 2017-03-04 DIAGNOSIS — N183 Chronic kidney disease, stage 3 (moderate): ICD-10-CM

## 2017-03-08 ENCOUNTER — Ambulatory Visit: Admit: 2017-03-08 | Discharge: 2017-03-08 | Payer: MEDICARE

## 2017-03-08 ENCOUNTER — Encounter: Admit: 2017-03-08 | Discharge: 2017-03-08 | Payer: MEDICARE

## 2017-03-08 DIAGNOSIS — N183 Chronic kidney disease, stage 3 (moderate): Principal | ICD-10-CM

## 2017-03-08 DIAGNOSIS — J302 Other seasonal allergic rhinitis: ICD-10-CM

## 2017-03-08 DIAGNOSIS — R350 Frequency of micturition: Principal | ICD-10-CM

## 2017-03-08 DIAGNOSIS — I10 Essential (primary) hypertension: Secondary | ICD-10-CM

## 2017-03-08 DIAGNOSIS — I5042 Chronic combined systolic (congestive) and diastolic (congestive) heart failure: ICD-10-CM

## 2017-03-08 DIAGNOSIS — G4733 Obstructive sleep apnea (adult) (pediatric): ICD-10-CM

## 2017-03-08 DIAGNOSIS — Z8614 Personal history of Methicillin resistant Staphylococcus aureus infection: ICD-10-CM

## 2017-03-08 DIAGNOSIS — E669 Obesity, unspecified: ICD-10-CM

## 2017-03-08 DIAGNOSIS — I2510 Atherosclerotic heart disease of native coronary artery without angina pectoris: Secondary | ICD-10-CM

## 2017-03-08 DIAGNOSIS — J189 Pneumonia, unspecified organism: ICD-10-CM

## 2017-03-08 DIAGNOSIS — G4730 Sleep apnea, unspecified: ICD-10-CM

## 2017-03-08 DIAGNOSIS — L03116 Cellulitis of left lower limb: ICD-10-CM

## 2017-03-08 DIAGNOSIS — R609 Edema, unspecified: ICD-10-CM

## 2017-03-08 DIAGNOSIS — R0600 Dyspnea, unspecified: ICD-10-CM

## 2017-03-08 DIAGNOSIS — Z9981 Dependence on supplemental oxygen: ICD-10-CM

## 2017-03-08 DIAGNOSIS — M961 Postlaminectomy syndrome, not elsewhere classified: ICD-10-CM

## 2017-03-08 DIAGNOSIS — R05 Cough: ICD-10-CM

## 2017-03-08 DIAGNOSIS — Z8679 Personal history of other diseases of the circulatory system: ICD-10-CM

## 2017-03-08 DIAGNOSIS — I714 Abdominal aortic aneurysm, without rupture: ICD-10-CM

## 2017-03-08 DIAGNOSIS — J479 Bronchiectasis, uncomplicated: ICD-10-CM

## 2017-03-08 DIAGNOSIS — Z95828 Presence of other vascular implants and grafts: ICD-10-CM

## 2017-03-08 DIAGNOSIS — E785 Hyperlipidemia, unspecified: ICD-10-CM

## 2017-03-08 DIAGNOSIS — M954 Acquired deformity of chest and rib: ICD-10-CM

## 2017-03-08 DIAGNOSIS — K219 Gastro-esophageal reflux disease without esophagitis: ICD-10-CM

## 2017-03-08 DIAGNOSIS — I429 Cardiomyopathy, unspecified: ICD-10-CM

## 2017-03-08 DIAGNOSIS — E782 Mixed hyperlipidemia: ICD-10-CM

## 2017-03-08 DIAGNOSIS — I509 Heart failure, unspecified: ICD-10-CM

## 2017-03-08 DIAGNOSIS — J45909 Unspecified asthma, uncomplicated: ICD-10-CM

## 2017-03-08 DIAGNOSIS — J449 Chronic obstructive pulmonary disease, unspecified: ICD-10-CM

## 2017-03-08 MED ORDER — MIRABEGRON 25 MG PO TB24
25 mg | ORAL_TABLET | Freq: Every day | ORAL | 4 refills | Status: AC
Start: 2017-03-08 — End: 2017-10-29

## 2017-03-09 ENCOUNTER — Encounter: Admit: 2017-03-09 | Discharge: 2017-03-09 | Payer: MEDICARE

## 2017-03-09 DIAGNOSIS — L03116 Cellulitis of left lower limb: ICD-10-CM

## 2017-03-09 DIAGNOSIS — I5042 Chronic combined systolic (congestive) and diastolic (congestive) heart failure: ICD-10-CM

## 2017-03-09 DIAGNOSIS — E785 Hyperlipidemia, unspecified: ICD-10-CM

## 2017-03-09 DIAGNOSIS — I509 Heart failure, unspecified: ICD-10-CM

## 2017-03-09 DIAGNOSIS — M961 Postlaminectomy syndrome, not elsewhere classified: ICD-10-CM

## 2017-03-09 DIAGNOSIS — J449 Chronic obstructive pulmonary disease, unspecified: ICD-10-CM

## 2017-03-09 DIAGNOSIS — E782 Mixed hyperlipidemia: ICD-10-CM

## 2017-03-09 DIAGNOSIS — I714 Abdominal aortic aneurysm, without rupture: ICD-10-CM

## 2017-03-09 DIAGNOSIS — Z9981 Dependence on supplemental oxygen: ICD-10-CM

## 2017-03-09 DIAGNOSIS — R0600 Dyspnea, unspecified: ICD-10-CM

## 2017-03-09 DIAGNOSIS — R609 Edema, unspecified: ICD-10-CM

## 2017-03-09 DIAGNOSIS — G4730 Sleep apnea, unspecified: ICD-10-CM

## 2017-03-09 DIAGNOSIS — J302 Other seasonal allergic rhinitis: ICD-10-CM

## 2017-03-09 DIAGNOSIS — K219 Gastro-esophageal reflux disease without esophagitis: ICD-10-CM

## 2017-03-09 DIAGNOSIS — G4733 Obstructive sleep apnea (adult) (pediatric): ICD-10-CM

## 2017-03-09 DIAGNOSIS — J479 Bronchiectasis, uncomplicated: ICD-10-CM

## 2017-03-09 DIAGNOSIS — Z95828 Presence of other vascular implants and grafts: ICD-10-CM

## 2017-03-09 DIAGNOSIS — I429 Cardiomyopathy, unspecified: ICD-10-CM

## 2017-03-09 DIAGNOSIS — J189 Pneumonia, unspecified organism: ICD-10-CM

## 2017-03-09 DIAGNOSIS — R05 Cough: ICD-10-CM

## 2017-03-09 DIAGNOSIS — Z8679 Personal history of other diseases of the circulatory system: ICD-10-CM

## 2017-03-09 DIAGNOSIS — Z8614 Personal history of Methicillin resistant Staphylococcus aureus infection: ICD-10-CM

## 2017-03-09 DIAGNOSIS — M954 Acquired deformity of chest and rib: ICD-10-CM

## 2017-03-09 DIAGNOSIS — I2510 Atherosclerotic heart disease of native coronary artery without angina pectoris: Secondary | ICD-10-CM

## 2017-03-09 DIAGNOSIS — N183 Chronic kidney disease, stage 3 (moderate): ICD-10-CM

## 2017-03-09 DIAGNOSIS — J45909 Unspecified asthma, uncomplicated: ICD-10-CM

## 2017-03-09 DIAGNOSIS — I10 Essential (primary) hypertension: Principal | ICD-10-CM

## 2017-03-09 DIAGNOSIS — E669 Obesity, unspecified: ICD-10-CM

## 2017-03-10 ENCOUNTER — Encounter: Admit: 2017-03-10 | Discharge: 2017-03-10 | Payer: MEDICARE

## 2017-03-14 ENCOUNTER — Encounter: Admit: 2017-03-14 | Discharge: 2017-03-14 | Payer: MEDICARE

## 2017-03-14 DIAGNOSIS — R972 Elevated prostate specific antigen [PSA]: Principal | ICD-10-CM

## 2017-03-14 DIAGNOSIS — N4231 Prostatic intraepithelial neoplasia: ICD-10-CM

## 2017-03-15 MED ORDER — SPIRONOLACTONE 25 MG PO TAB
ORAL_TABLET | Freq: Two times a day (BID) | ORAL | 3 refills | 90.00000 days | Status: AC
Start: 2017-03-15 — End: 2017-11-19

## 2017-03-21 ENCOUNTER — Ambulatory Visit: Admit: 2017-03-21 | Discharge: 2017-03-21 | Payer: MEDICARE

## 2017-03-22 ENCOUNTER — Encounter: Admit: 2017-03-22 | Discharge: 2017-03-22 | Payer: MEDICARE

## 2017-03-26 ENCOUNTER — Encounter: Admit: 2017-03-26 | Discharge: 2017-03-26 | Payer: MEDICARE

## 2017-03-26 ENCOUNTER — Encounter: Admit: 2017-03-26 | Discharge: 2017-03-27 | Payer: MEDICARE

## 2017-03-27 ENCOUNTER — Ambulatory Visit: Admit: 2017-03-27 | Discharge: 2017-03-28 | Payer: MEDICARE

## 2017-03-27 ENCOUNTER — Encounter: Admit: 2017-03-27 | Discharge: 2017-03-27 | Payer: MEDICARE

## 2017-03-27 DIAGNOSIS — G4733 Obstructive sleep apnea (adult) (pediatric): ICD-10-CM

## 2017-03-27 DIAGNOSIS — E669 Obesity, unspecified: ICD-10-CM

## 2017-03-27 DIAGNOSIS — E785 Hyperlipidemia, unspecified: ICD-10-CM

## 2017-03-27 DIAGNOSIS — L03116 Cellulitis of left lower limb: ICD-10-CM

## 2017-03-27 DIAGNOSIS — R609 Edema, unspecified: ICD-10-CM

## 2017-03-27 DIAGNOSIS — J45909 Unspecified asthma, uncomplicated: ICD-10-CM

## 2017-03-27 DIAGNOSIS — Z95810 Presence of automatic (implantable) cardiac defibrillator: Secondary | ICD-10-CM

## 2017-03-27 DIAGNOSIS — N183 Chronic kidney disease, stage 3 (moderate): ICD-10-CM

## 2017-03-27 DIAGNOSIS — R05 Cough: Principal | ICD-10-CM

## 2017-03-27 DIAGNOSIS — J302 Other seasonal allergic rhinitis: ICD-10-CM

## 2017-03-27 DIAGNOSIS — G4730 Sleep apnea, unspecified: ICD-10-CM

## 2017-03-27 DIAGNOSIS — I714 Abdominal aortic aneurysm, without rupture: ICD-10-CM

## 2017-03-27 DIAGNOSIS — Z8679 Personal history of other diseases of the circulatory system: ICD-10-CM

## 2017-03-27 DIAGNOSIS — Z8614 Personal history of Methicillin resistant Staphylococcus aureus infection: ICD-10-CM

## 2017-03-27 DIAGNOSIS — M954 Acquired deformity of chest and rib: ICD-10-CM

## 2017-03-27 DIAGNOSIS — R0600 Dyspnea, unspecified: ICD-10-CM

## 2017-03-27 DIAGNOSIS — I429 Cardiomyopathy, unspecified: ICD-10-CM

## 2017-03-27 DIAGNOSIS — J449 Chronic obstructive pulmonary disease, unspecified: ICD-10-CM

## 2017-03-27 DIAGNOSIS — J479 Bronchiectasis, uncomplicated: ICD-10-CM

## 2017-03-27 DIAGNOSIS — E782 Mixed hyperlipidemia: ICD-10-CM

## 2017-03-27 DIAGNOSIS — I509 Heart failure, unspecified: ICD-10-CM

## 2017-03-27 DIAGNOSIS — K219 Gastro-esophageal reflux disease without esophagitis: ICD-10-CM

## 2017-03-27 DIAGNOSIS — I2510 Atherosclerotic heart disease of native coronary artery without angina pectoris: ICD-10-CM

## 2017-03-27 DIAGNOSIS — I5042 Chronic combined systolic (congestive) and diastolic (congestive) heart failure: ICD-10-CM

## 2017-03-27 DIAGNOSIS — M961 Postlaminectomy syndrome, not elsewhere classified: ICD-10-CM

## 2017-03-27 DIAGNOSIS — I10 Essential (primary) hypertension: ICD-10-CM

## 2017-03-27 DIAGNOSIS — Z9981 Dependence on supplemental oxygen: ICD-10-CM

## 2017-03-27 DIAGNOSIS — R0602 Shortness of breath: ICD-10-CM

## 2017-03-27 DIAGNOSIS — J189 Pneumonia, unspecified organism: ICD-10-CM

## 2017-03-27 DIAGNOSIS — Z95828 Presence of other vascular implants and grafts: ICD-10-CM

## 2017-03-27 MED ORDER — DULOXETINE 60 MG PO CPDR
60 mg | Freq: Every day | ORAL | 0 refills | Status: DC
Start: 2017-03-27 — End: 2017-03-28

## 2017-03-27 MED ORDER — FAMOTIDINE 20 MG PO TAB
20 mg | Freq: Two times a day (BID) | ORAL | 0 refills | Status: DC
Start: 2017-03-27 — End: 2017-03-28

## 2017-03-27 MED ORDER — GABAPENTIN 400 MG PO CAP
400 mg | ORAL | 0 refills | Status: DC
Start: 2017-03-27 — End: 2017-04-08
  Administered 2017-03-28 – 2017-04-08 (×43): 400 mg via ORAL

## 2017-03-27 MED ORDER — ALBUTEROL SULFATE 90 MCG/ACTUATION IN HFAA
2 | RESPIRATORY_TRACT | 0 refills | Status: DC | PRN
Start: 2017-03-27 — End: 2017-03-30
  Administered 2017-03-28 (×2): 2 via RESPIRATORY_TRACT

## 2017-03-27 MED ORDER — METOLAZONE 5 MG PO TAB
5 mg | Freq: Every day | ORAL | 0 refills | Status: DC
Start: 2017-03-27 — End: 2017-03-28

## 2017-03-27 MED ORDER — ATORVASTATIN 20 MG PO TAB
20 mg | Freq: Every day | ORAL | 0 refills | Status: DC
Start: 2017-03-27 — End: 2017-04-07
  Administered 2017-03-28 – 2017-04-07 (×11): 20 mg via ORAL

## 2017-03-27 MED ORDER — TAMSULOSIN 0.4 MG PO CAP
0.4 mg | Freq: Every day | ORAL | 0 refills | Status: DC
Start: 2017-03-27 — End: 2017-04-08
  Administered 2017-03-28 – 2017-04-08 (×11): 0.4 mg via ORAL

## 2017-03-27 MED ORDER — IPRATROPIUM BROMIDE 0.02 % IN SOLN
0.5 mg | RESPIRATORY_TRACT | 0 refills | Status: DC | PRN
Start: 2017-03-27 — End: 2017-03-28
  Administered 2017-03-28: 04:00:00 0.5 mg via RESPIRATORY_TRACT

## 2017-03-27 MED ORDER — MIRABEGRON 25 MG PO TB24
25 mg | Freq: Every day | ORAL | 0 refills | Status: DC
Start: 2017-03-27 — End: 2017-04-08
  Administered 2017-03-28 – 2017-04-08 (×12): 25 mg via ORAL

## 2017-03-27 MED ORDER — MONTELUKAST 10 MG PO TAB
10 mg | Freq: Every evening | ORAL | 0 refills | Status: DC
Start: 2017-03-27 — End: 2017-04-08
  Administered 2017-03-28 – 2017-04-08 (×12): 10 mg via ORAL

## 2017-03-27 MED ORDER — METOPROLOL SUCCINATE 25 MG PO TB24
25 mg | Freq: Every day | ORAL | 0 refills | Status: DC
Start: 2017-03-27 — End: 2017-04-08
  Administered 2017-03-28 – 2017-04-08 (×10): 25 mg via ORAL

## 2017-03-27 MED ORDER — SENNOSIDES-DOCUSATE SODIUM 8.6-50 MG PO TAB
1 | Freq: Every day | ORAL | 0 refills | Status: DC | PRN
Start: 2017-03-27 — End: 2017-04-02
  Administered 2017-03-28 – 2017-04-02 (×4): 1 via ORAL

## 2017-03-27 MED ORDER — ENOXAPARIN 40 MG/0.4 ML SC SYRG
40 mg | Freq: Two times a day (BID) | SUBCUTANEOUS | 0 refills | Status: DC
Start: 2017-03-27 — End: 2017-04-02
  Administered 2017-03-28 – 2017-04-02 (×11): 40 mg via SUBCUTANEOUS

## 2017-03-27 MED ORDER — NITROGLYCERIN 0.4 MG SL SUBL
.4 mg | SUBLINGUAL | 0 refills | Status: DC | PRN
Start: 2017-03-27 — End: 2017-04-08

## 2017-03-27 MED ORDER — IPRATROPIUM BROMIDE 42 MCG (0.06 %) NA SPRY
1 | Freq: Three times a day (TID) | NASAL | 0 refills | Status: DC
Start: 2017-03-27 — End: 2017-04-08
  Administered 2017-03-28 – 2017-03-29 (×2): 1 via NASAL

## 2017-03-27 MED ORDER — FERROUS SULFATE 325 MG (65 MG IRON) PO TAB
325 mg | Freq: Every day | ORAL | 0 refills | Status: DC
Start: 2017-03-27 — End: 2017-04-08
  Administered 2017-03-28 – 2017-04-08 (×11): 325 mg via ORAL

## 2017-03-27 MED ORDER — PANTOPRAZOLE 40 MG PO TBEC
40 mg | Freq: Two times a day (BID) | ORAL | 0 refills | Status: DC
Start: 2017-03-27 — End: 2017-04-08
  Administered 2017-03-28 – 2017-04-08 (×24): 40 mg via ORAL

## 2017-03-27 MED ORDER — HELP MEDICATION
Freq: Every day | 0 refills | Status: DC
Start: 2017-03-27 — End: 2017-03-28

## 2017-03-27 MED ORDER — ALLOPURINOL 300 MG PO TAB
300 mg | Freq: Every day | ORAL | 0 refills | Status: DC
Start: 2017-03-27 — End: 2017-04-08
  Administered 2017-03-28 – 2017-04-08 (×12): 300 mg via ORAL

## 2017-03-27 MED ORDER — VANCOMYCIN 2,000 MG IVPB
2000 mg | Freq: Two times a day (BID) | INTRAVENOUS | 0 refills | Status: DC
Start: 2017-03-27 — End: 2017-03-28

## 2017-03-27 MED ORDER — SPIRONOLACTONE 25 MG PO TAB
25 mg | Freq: Two times a day (BID) | ORAL | 0 refills | Status: DC
Start: 2017-03-27 — End: 2017-04-08
  Administered 2017-03-28 – 2017-04-08 (×23): 25 mg via ORAL

## 2017-03-27 MED ORDER — PIPERACILLIN/TAZOBACTAM 4.5 G/NS IVPB (MB+)
4.5 g | Freq: Once | INTRAVENOUS | 0 refills | Status: CP
Start: 2017-03-27 — End: ?
  Administered 2017-03-28 (×2): 4.5 g via INTRAVENOUS

## 2017-03-27 MED ORDER — ACETAMINOPHEN 325 MG PO TAB
650 mg | ORAL | 0 refills | Status: DC | PRN
Start: 2017-03-27 — End: 2017-04-08
  Administered 2017-04-05: 12:00:00 650 mg via ORAL

## 2017-03-27 MED ORDER — VANCOMYCIN PHARMACY TO MANAGE
1 | 0 refills | Status: DC
Start: 2017-03-27 — End: 2017-03-28

## 2017-03-27 MED ORDER — ONDANSETRON HCL (PF) 4 MG/2 ML IJ SOLN
4 mg | INTRAVENOUS | 0 refills | Status: DC | PRN
Start: 2017-03-27 — End: 2017-04-08
  Administered 2017-03-29: 15:00:00 4 mg via INTRAVENOUS

## 2017-03-27 MED ORDER — BUDESONIDE-FORMOTEROL 160-4.5 MCG/ACTUATION IN HFAA
2 | Freq: Two times a day (BID) | RESPIRATORY_TRACT | 0 refills | Status: DC
Start: 2017-03-27 — End: 2017-04-08
  Administered 2017-03-28: 14:00:00 2 via RESPIRATORY_TRACT

## 2017-03-27 MED ORDER — VANCOMYCIN 2,000 MG IVPB
2 g | Freq: Once | INTRAVENOUS | 0 refills | Status: CP
Start: 2017-03-27 — End: ?
  Administered 2017-03-28 (×2): 2 g via INTRAVENOUS

## 2017-03-27 MED ORDER — ALBUTEROL SULFATE 2.5 MG/0.5 ML IN NEBU
2.5 mg | Freq: Four times a day (QID) | RESPIRATORY_TRACT | 0 refills | Status: DC | PRN
Start: 2017-03-27 — End: 2017-03-28

## 2017-03-27 MED ORDER — PIPERACILLIN/TAZOBACTAM 4.5 G/NS IVPB (MB+)
4.5 g | INTRAVENOUS | 0 refills | Status: DC
Start: 2017-03-27 — End: 2017-03-28
  Administered 2017-03-28 (×4): 4.5 g via INTRAVENOUS

## 2017-03-27 MED ORDER — IMS MIXTURE TEMPLATE
3 mg | Freq: Two times a day (BID) | ORAL | 0 refills | Status: DC
Start: 2017-03-27 — End: 2017-03-28

## 2017-03-27 MED ORDER — CETIRIZINE 10 MG PO TAB
10 mg | Freq: Every morning | ORAL | 0 refills | Status: DC
Start: 2017-03-27 — End: 2017-04-08
  Administered 2017-03-28 – 2017-04-08 (×12): 10 mg via ORAL

## 2017-03-27 MED ORDER — ESCITALOPRAM OXALATE 10 MG PO TAB
10 mg | Freq: Every day | ORAL | 0 refills | Status: DC
Start: 2017-03-27 — End: 2017-03-29
  Administered 2017-03-28 – 2017-03-29 (×2): 10 mg via ORAL

## 2017-03-28 DIAGNOSIS — I130 Hypertensive heart and chronic kidney disease with heart failure and stage 1 through stage 4 chronic kidney disease, or unspecified chronic kidney disease: Principal | ICD-10-CM

## 2017-03-28 LAB — CBC AND DIFF
Lab: 0 % (ref 0–2)
Lab: 0 10*3/uL (ref 0–0.20)
Lab: 0.2 10*3/uL (ref 0–0.45)
Lab: 1.7 10*3/uL (ref 1.0–4.8)
Lab: 101 FL — ABNORMAL HIGH (ref 80–100)
Lab: 16 % — ABNORMAL HIGH (ref 4–12)
Lab: 3 % (ref 0–5)
Lab: 3.9 M/UL — ABNORMAL LOW (ref 4.4–5.5)
Lab: 6.3 10*3/uL (ref 4.5–11.0)
Lab: 0 % — ABNORMAL HIGH (ref >60)
Lab: 0 10*3/uL (ref 0–0.20)
Lab: 0.1 10*3/uL (ref 0–0.45)
Lab: 1.2 10*3/uL — ABNORMAL HIGH (ref 0–0.80)
Lab: 2 % — ABNORMAL LOW (ref >60)
Lab: 21 % — ABNORMAL HIGH (ref 4–12)
Lab: 212 K/UL (ref 150–400)
Lab: 22 % — ABNORMAL LOW (ref >60)
Lab: 3.3 10*3/uL (ref 1.8–7.0)
Lab: 3.6 M/UL — ABNORMAL LOW (ref >60)
Lab: 35 pg — ABNORMAL HIGH (ref 26–34)
Lab: 37 % — ABNORMAL LOW (ref 40–50)
Lab: 55 % — ABNORMAL LOW (ref >60)

## 2017-03-28 LAB — COMPREHENSIVE METABOLIC PANEL
Lab: 1 mg/dL (ref 0.3–1.2)
Lab: 1.5 mg/dL — ABNORMAL HIGH (ref 0.4–1.24)
Lab: 101 MMOL/L (ref 98–110)
Lab: 137 MMOL/L (ref 137–147)
Lab: 21 U/L (ref 7–40)
Lab: 22 MMOL/L (ref 21–30)
Lab: 24 U/L (ref 7–56)
Lab: 3.8 MMOL/L (ref 3.5–5.1)
Lab: 33 mg/dL — ABNORMAL HIGH (ref 7–25)
Lab: 4.3 g/dL (ref 3.5–5.0)
Lab: 45 mL/min — ABNORMAL LOW (ref >60)
Lab: 6.7 g/dL (ref 6.0–8.0)
Lab: 76 U/L (ref 25–110)
Lab: 9.6 mg/dL (ref 8.5–10.6)
Lab: 14 — ABNORMAL HIGH (ref 3–12)
Lab: 18 U/L — ABNORMAL LOW (ref 7–40)
Lab: 3.9 MMOL/L — ABNORMAL HIGH (ref 3.5–5.1)
Lab: 4 g/dL — ABNORMAL HIGH (ref 3.5–5.0)

## 2017-03-28 LAB — RVP VIRAL PANEL PCR: Lab: DETECTED (ref 3–12)

## 2017-03-28 LAB — MAGNESIUM: Lab: 1.7 mg/dL — ABNORMAL LOW (ref 1.6–2.6)

## 2017-03-28 LAB — PROTIME INR (PT): Lab: 1 (ref 0.8–1.2)

## 2017-03-28 LAB — POC BLOOD GAS ARTERIAL
Lab: 23 MMOL/L (ref 21–28)
Lab: 34 mmHg — ABNORMAL LOW (ref 35–45)
Lab: 7.4 (ref 7.35–7.45)
Lab: 95 % (ref 95–99)

## 2017-03-28 LAB — TROPONIN-I
Lab: 0 ng/mL (ref 0.0–0.05)
Lab: 0 ng/mL (ref 0.0–0.05)
Lab: 0 ng/mL (ref 0.0–0.05)

## 2017-03-28 LAB — PROCALCITONIN: Lab: 0.1 ng/mL — ABNORMAL HIGH (ref <0.10)

## 2017-03-28 LAB — BNP (B-TYPE NATRIURETIC PEPTI): Lab: 49 pg/mL (ref 0–100)

## 2017-03-28 LAB — LACTIC ACID(LACTATE): Lab: 1.9 MMOL/L (ref 0.5–2.0)

## 2017-03-28 LAB — PTT (APTT): Lab: 28 s (ref 24.0–36.5)

## 2017-03-28 MED ORDER — BUMETANIDE 0.25 MG/ML IJ SOLN
4 mg | Freq: Once | INTRAVENOUS | 0 refills | Status: CP
Start: 2017-03-28 — End: ?
  Administered 2017-03-28: 23:00:00 4 mg via INTRAVENOUS

## 2017-03-28 MED ORDER — VANCOMYCIN 1,500 MG IVPB
1500 mg | INTRAVENOUS | 0 refills | Status: DC
Start: 2017-03-28 — End: 2017-03-28

## 2017-03-28 MED ORDER — POTASSIUM CHLORIDE 20 MEQ PO TBTQ
40 meq | Freq: Once | ORAL | 0 refills | Status: CP
Start: 2017-03-28 — End: ?
  Administered 2017-03-28: 07:00:00 40 meq via ORAL

## 2017-03-28 MED ORDER — IMS MIXTURE TEMPLATE
60 mg | Freq: Every day | ORAL | 0 refills | Status: DC
Start: 2017-03-28 — End: 2017-03-30
  Administered 2017-03-28 – 2017-03-30 (×6): 60 mg via ORAL

## 2017-03-28 MED ORDER — DOXYCYCLINE HYCLATE 100 MG PO TAB
100 mg | Freq: Two times a day (BID) | ORAL | 0 refills | Status: CP
Start: 2017-03-28 — End: ?
  Administered 2017-03-29 – 2017-04-02 (×9): 100 mg via ORAL

## 2017-03-28 MED ORDER — MELATONIN 3 MG PO TAB
3 mg | Freq: Every evening | ORAL | 0 refills | Status: DC | PRN
Start: 2017-03-28 — End: 2017-04-08
  Administered 2017-03-29 – 2017-04-08 (×10): 3 mg via ORAL

## 2017-03-28 MED ORDER — VANCOMYCIN PHARMACY TO MANAGE
1 | 0 refills | Status: DC
Start: 2017-03-28 — End: 2017-03-28

## 2017-03-28 MED ORDER — DICLOFENAC SODIUM 1 % TP GEL
2 g | Freq: Four times a day (QID) | TOPICAL | 0 refills | Status: DC
Start: 2017-03-28 — End: 2017-04-08
  Administered 2017-03-28 – 2017-04-06 (×2): 2 g via TOPICAL

## 2017-03-28 MED ORDER — PERFLUTREN LIPID MICROSPHERES 1.1 MG/ML IV SUSP
1-20 mL | Freq: Once | INTRAVENOUS | 0 refills | Status: CP
Start: 2017-03-28 — End: ?
  Administered 2017-03-28: 18:00:00 2 mL via INTRAVENOUS

## 2017-03-28 MED ORDER — BUMETANIDE 2 MG PO TAB
2 mg | Freq: Two times a day (BID) | ORAL | 0 refills | Status: DC
Start: 2017-03-28 — End: 2017-03-28
  Administered 2017-03-28 (×2): 2 mg via ORAL

## 2017-03-28 MED ADMIN — ALBUTEROL SULFATE 2.5 MG/0.5 ML IN NEBU [93139]: 2.5 mg | RESPIRATORY_TRACT | @ 04:00:00 | Stop: 2017-03-28 | NDC 00487990130

## 2017-03-29 LAB — CBC AND DIFF
Lab: 12 g/dL — ABNORMAL LOW (ref >60)
Lab: 13 % — ABNORMAL LOW (ref 24–44)
Lab: 5.9 K/UL (ref 4.5–11.0)

## 2017-03-29 LAB — POC GLUCOSE: Lab: 215 mg/dL — ABNORMAL HIGH (ref 70–100)

## 2017-03-29 LAB — COMPREHENSIVE METABOLIC PANEL
Lab: 0.8 mg/dL — ABNORMAL LOW (ref 0.3–1.2)
Lab: 1.4 mg/dL — ABNORMAL HIGH (ref >60)

## 2017-03-29 MED ORDER — ALBUTEROL SULFATE 2.5 MG /3 ML (0.083 %) IN NEBU
2.5 mg | RESPIRATORY_TRACT | 0 refills | Status: DC | PRN
Start: 2017-03-29 — End: 2017-04-03
  Administered 2017-03-29 – 2017-04-02 (×20): 2.5 mg via RESPIRATORY_TRACT

## 2017-03-29 MED ORDER — BUMETANIDE 0.25 MG/ML IJ SOLN
4 mg | Freq: Once | INTRAVENOUS | 0 refills | Status: CP
Start: 2017-03-29 — End: ?
  Administered 2017-03-29: 23:00:00 4 mg via INTRAVENOUS

## 2017-03-29 MED ORDER — BUMETANIDE 0.25 MG/ML IJ SOLN
4 mg | Freq: Two times a day (BID) | INTRAVENOUS | 0 refills | Status: DC
Start: 2017-03-29 — End: 2017-03-29

## 2017-03-29 MED ORDER — BUMETANIDE 0.25 MG/ML IJ SOLN
4 mg | Freq: Every day | INTRAVENOUS | 0 refills | Status: DC
Start: 2017-03-29 — End: 2017-03-30
  Administered 2017-03-29 – 2017-03-30 (×2): 4 mg via INTRAVENOUS

## 2017-03-29 MED ORDER — GUAIFENESIN 600 MG PO TA12
600 mg | Freq: Two times a day (BID) | ORAL | 0 refills | Status: DC
Start: 2017-03-29 — End: 2017-04-08
  Administered 2017-03-29 – 2017-04-08 (×21): 600 mg via ORAL

## 2017-03-29 MED ORDER — DULOXETINE 60 MG PO CPDR
60 mg | Freq: Every day | ORAL | 0 refills | Status: DC
Start: 2017-03-29 — End: 2017-04-08
  Administered 2017-03-30 – 2017-04-08 (×10): 60 mg via ORAL

## 2017-03-29 MED ADMIN — ALBUTEROL SULFATE 2.5 MG /3 ML (0.083 %) IN NEBU [250]: 2.5 mg | RESPIRATORY_TRACT | @ 19:00:00 | Stop: 2017-03-29 | NDC 04879050101

## 2017-03-30 LAB — CBC AND DIFF
Lab: 12 g/dL — ABNORMAL LOW (ref 13.5–16.5)
Lab: 34 g/dL — ABNORMAL HIGH (ref >60)
Lab: 35 % — ABNORMAL LOW (ref >60)
Lab: 9.3 K/UL — ABNORMAL LOW (ref 4.5–11.0)

## 2017-03-30 LAB — POC GLUCOSE
Lab: 136 mg/dL — ABNORMAL HIGH (ref 70–100)
Lab: 333 mg/dL — ABNORMAL HIGH (ref 70–100)

## 2017-03-30 LAB — COMPREHENSIVE METABOLIC PANEL: Lab: 9.1 mg/dL — ABNORMAL HIGH (ref 8.5–10.6)

## 2017-03-30 MED ORDER — BENZONATATE 100 MG PO CAP
200 mg | ORAL | 0 refills | Status: DC | PRN
Start: 2017-03-30 — End: 2017-04-08
  Administered 2017-03-30 – 2017-04-07 (×7): 200 mg via ORAL

## 2017-03-30 MED ORDER — INSULIN ASPART 100 UNIT/ML SC FLEXPEN
0-6 [IU] | Freq: Before meals | SUBCUTANEOUS | 0 refills | Status: DC
Start: 2017-03-30 — End: 2017-03-31
  Administered 2017-03-31: 4 [IU] via SUBCUTANEOUS

## 2017-03-30 MED ORDER — BUMETANIDE 0.25 MG/ML IJ SOLN
4 mg | Freq: Once | INTRAVENOUS | 0 refills | Status: CP
Start: 2017-03-30 — End: ?
  Administered 2017-03-31: 4 mg via INTRAVENOUS

## 2017-03-30 MED ORDER — METHYLPREDNISOLONE SOD SUC(PF) 125 MG/2 ML IJ SOLR
62.5 mg | INTRAVENOUS | 0 refills | Status: DC
Start: 2017-03-30 — End: 2017-04-01
  Administered 2017-03-30 – 2017-04-01 (×8): 62.5 mg via INTRAVENOUS

## 2017-03-30 MED ORDER — BUMETANIDE 2 MG PO TAB
4 mg | Freq: Every day | ORAL | 0 refills | Status: DC
Start: 2017-03-30 — End: 2017-04-02
  Administered 2017-03-31 – 2017-04-01 (×2): 4 mg via ORAL

## 2017-03-31 LAB — COMPREHENSIVE METABOLIC PANEL
Lab: 0.5 mg/dL — ABNORMAL LOW (ref >60)
Lab: 3.8 g/dL — ABNORMAL LOW (ref 3.5–5.0)
Lab: 8.7 mg/dL — ABNORMAL LOW (ref >60)

## 2017-03-31 LAB — POC GLUCOSE
Lab: 447 mg/dL — ABNORMAL HIGH (ref 70–100)
Lab: 524 mg/dL — ABNORMAL HIGH (ref 70–100)
Lab: 493 mg/dL — ABNORMAL HIGH (ref 70–100)
Lab: 432 mg/dL — ABNORMAL HIGH (ref 70–100)
Lab: 324 mg/dL — ABNORMAL HIGH (ref 70–100)

## 2017-03-31 LAB — CBC AND DIFF
Lab: 173 K/UL — ABNORMAL LOW (ref >60)
Lab: 3 % — ABNORMAL LOW (ref >60)
Lab: 3.2 M/UL — ABNORMAL LOW (ref >60)

## 2017-03-31 MED ORDER — MAGNESIUM SULFATE IN D5W 1 GRAM/100 ML IV PGBK
1 g | INTRAVENOUS | 0 refills | Status: CP
Start: 2017-03-31 — End: ?
  Administered 2017-03-31 (×2): 1 g via INTRAVENOUS

## 2017-03-31 MED ORDER — INSULIN ASPART 100 UNIT/ML SC FLEXPEN
0-12 [IU] | Freq: Before meals | SUBCUTANEOUS | 0 refills | Status: DC
Start: 2017-03-31 — End: 2017-03-31

## 2017-03-31 MED ORDER — INSULIN ASPART 100 UNIT/ML SC FLEXPEN
0-24 [IU] | Freq: Before meals | SUBCUTANEOUS | 0 refills | Status: DC
Start: 2017-03-31 — End: 2017-04-03
  Administered 2017-04-03: 03:00:00 4 [IU] via SUBCUTANEOUS

## 2017-03-31 MED ORDER — NPH INSULIN HUMAN RECOMB 100 UNIT/ML (3 ML) SC PEN
10 [IU] | Freq: Two times a day (BID) | SUBCUTANEOUS | 0 refills | Status: DC
Start: 2017-03-31 — End: 2017-04-01
  Administered 2017-03-31: 15:00:00 10 [IU] via SUBCUTANEOUS

## 2017-04-01 LAB — POC GLUCOSE
Lab: 241 mg/dL — ABNORMAL HIGH (ref 70–100)
Lab: 284 mg/dL — ABNORMAL HIGH (ref 70–100)
Lab: 485 mg/dL — ABNORMAL HIGH (ref 70–100)
Lab: 544 mg/dL — ABNORMAL HIGH (ref 70–100)

## 2017-04-01 LAB — CBC AND DIFF: Lab: 11 g/dL — ABNORMAL LOW (ref >60)

## 2017-04-01 MED ORDER — BUMETANIDE 0.25 MG/ML IJ SOLN
0.5 mg | Freq: Once | INTRAVENOUS | 0 refills | Status: CP
Start: 2017-04-01 — End: ?
  Administered 2017-04-01: 21:00:00 0.5 mg via INTRAVENOUS

## 2017-04-01 MED ORDER — NPH INSULIN HUMAN RECOMB 100 UNIT/ML (3 ML) SC PEN
30 [IU] | Freq: Two times a day (BID) | SUBCUTANEOUS | 0 refills | Status: DC
Start: 2017-04-01 — End: 2017-04-02

## 2017-04-01 MED ORDER — NPH INSULIN HUMAN RECOMB 100 UNIT/ML (3 ML) SC PEN
20 [IU] | Freq: Once | SUBCUTANEOUS | 0 refills | Status: CP
Start: 2017-04-01 — End: ?

## 2017-04-01 MED ORDER — IMS MIXTURE TEMPLATE
60 mg | Freq: Every day | ORAL | 0 refills | Status: CP
Start: 2017-04-01 — End: ?
  Administered 2017-04-02 – 2017-04-04 (×6): 60 mg via ORAL

## 2017-04-01 MED ORDER — METHYLPREDNISOLONE SOD SUC(PF) 125 MG/2 ML IJ SOLR
62.5 mg | INTRAVENOUS | 0 refills | Status: CP
Start: 2017-04-01 — End: ?
  Administered 2017-04-01 – 2017-04-02 (×3): 62.5 mg via INTRAVENOUS

## 2017-04-02 ENCOUNTER — Encounter: Admit: 2017-04-02 | Discharge: 2017-04-02 | Payer: MEDICARE

## 2017-04-02 LAB — COMPREHENSIVE METABOLIC PANEL
Lab: 0.4 mg/dL (ref 0.3–1.2)
Lab: 1.3 mg/dL — ABNORMAL HIGH (ref 0.4–1.24)
Lab: 100 MMOL/L — ABNORMAL LOW (ref 98–110)
Lab: 15 U/L (ref 7–40)
Lab: 3.7 g/dL — ABNORMAL HIGH (ref 3.5–5.0)
Lab: 51 mL/min — ABNORMAL LOW (ref >60)
Lab: 6.1 g/dL (ref 6.0–8.0)
Lab: 79 U/L — ABNORMAL LOW (ref 25–110)
Lab: 9 MMOL/L — ABNORMAL LOW (ref 3–12)
Lab: 9 mg/dL (ref 8.5–10.6)

## 2017-04-02 LAB — CBC AND DIFF
Lab: 103 FL — ABNORMAL HIGH (ref 80–100)
Lab: 12 g/dL — ABNORMAL LOW (ref 13.5–16.5)
Lab: 3.5 M/UL — ABNORMAL LOW (ref 4.4–5.5)
Lab: 35 pg — ABNORMAL HIGH (ref 26–34)
Lab: 9.1 10*3/uL (ref 4.5–11.0)

## 2017-04-02 LAB — CULTURE-BLOOD W/SENSITIVITY

## 2017-04-02 LAB — POC GLUCOSE
Lab: 281 mg/dL — ABNORMAL HIGH (ref 70–100)
Lab: 203 mg/dL — ABNORMAL HIGH (ref 70–100)

## 2017-04-02 MED ORDER — DIPHENHYDRAMINE HCL 25 MG PO CAP
25 mg | ORAL | 0 refills | Status: DC | PRN
Start: 2017-04-02 — End: 2017-04-06

## 2017-04-02 MED ORDER — CLOPIDOGREL 300 MG PO TAB
600 mg | Freq: Once | ORAL | 0 refills | Status: CP
Start: 2017-04-02 — End: ?
  Administered 2017-04-02: 21:00:00 600 mg via ORAL

## 2017-04-02 MED ORDER — LIDOCAINE 5 % TP PTMD
1 | Freq: Every day | TOPICAL | 0 refills | Status: DC
Start: 2017-04-02 — End: 2017-04-08
  Administered 2017-04-03 – 2017-04-08 (×7): 1 via TOPICAL

## 2017-04-02 MED ORDER — ASPIRIN 81 MG PO TBEC
81 mg | Freq: Every day | ORAL | 0 refills | Status: DC
Start: 2017-04-02 — End: 2017-04-08
  Administered 2017-04-03 – 2017-04-08 (×5): 81 mg via ORAL

## 2017-04-02 MED ORDER — NPH INSULIN HUMAN RECOMB 100 UNIT/ML (3 ML) SC PEN
20 [IU] | Freq: Every evening | SUBCUTANEOUS | 0 refills | Status: DC
Start: 2017-04-02 — End: 2017-04-03
  Administered 2017-04-03: 04:00:00 20 [IU] via SUBCUTANEOUS

## 2017-04-02 MED ORDER — BUMETANIDE 0.25 MG/ML IJ SOLN
1 mg | Freq: Once | INTRAVENOUS | 0 refills | Status: CP
Start: 2017-04-02 — End: ?
  Administered 2017-04-03: 01:00:00 1 mg via INTRAVENOUS

## 2017-04-02 MED ORDER — POLYETHYLENE GLYCOL 3350 17 GRAM PO PWPK
1 | Freq: Two times a day (BID) | ORAL | 0 refills | Status: DC
Start: 2017-04-02 — End: 2017-04-08
  Administered 2017-04-03 – 2017-04-08 (×10): 17 g via ORAL

## 2017-04-02 MED ORDER — NPH INSULIN HUMAN RECOMB 100 UNIT/ML (3 ML) SC PEN
20 [IU] | Freq: Every day | SUBCUTANEOUS | 0 refills | Status: DC
Start: 2017-04-02 — End: 2017-04-03

## 2017-04-02 MED ORDER — ASPIRIN 325 MG PO TAB
325 mg | Freq: Once | ORAL | 0 refills | Status: CP
Start: 2017-04-02 — End: ?
  Administered 2017-04-02: 15:00:00 325 mg via ORAL

## 2017-04-02 MED ORDER — CLOPIDOGREL 75 MG PO TAB
75 mg | Freq: Every day | ORAL | 0 refills | Status: DC
Start: 2017-04-02 — End: 2017-04-08
  Administered 2017-04-03 – 2017-04-08 (×6): 75 mg via ORAL

## 2017-04-02 MED ORDER — BUMETANIDE 2 MG PO TAB
2 mg | Freq: Two times a day (BID) | ORAL | 0 refills | Status: DC
Start: 2017-04-02 — End: 2017-04-02

## 2017-04-02 MED ORDER — ALUMINUM-MAGNESIUM HYDROXIDE 200-200 MG/5 ML PO SUSP
30 mL | ORAL | 0 refills | Status: DC | PRN
Start: 2017-04-02 — End: 2017-04-08

## 2017-04-02 MED ORDER — SENNOSIDES-DOCUSATE SODIUM 8.6-50 MG PO TAB
1 | Freq: Two times a day (BID) | ORAL | 0 refills | Status: DC
Start: 2017-04-02 — End: 2017-04-08
  Administered 2017-04-03 – 2017-04-08 (×11): 1 via ORAL

## 2017-04-02 MED ORDER — ALBUTEROL SULFATE 2.5 MG /3 ML (0.083 %) IN NEBU
2.5 mg | Freq: Four times a day (QID) | RESPIRATORY_TRACT | 0 refills | Status: DC | PRN
Start: 2017-04-02 — End: 2017-04-08
  Administered 2017-04-03 – 2017-04-08 (×22): 2.5 mg via RESPIRATORY_TRACT

## 2017-04-02 MED ORDER — DIPHENHYDRAMINE HCL 50 MG/ML IJ SOLN
25 mg | INTRAVENOUS | 0 refills | Status: DC | PRN
Start: 2017-04-02 — End: 2017-04-06

## 2017-04-02 MED ORDER — PROMETHAZINE-CODEINE 6.25-10 MG/5 ML PO SYRP
5 mL | ORAL | 0 refills | Status: DC | PRN
Start: 2017-04-02 — End: 2017-04-08
  Administered 2017-04-03 – 2017-04-07 (×2): 5 mL via ORAL

## 2017-04-02 MED ORDER — NPH INSULIN HUMAN RECOMB 100 UNIT/ML (3 ML) SC PEN
10 [IU] | Freq: Every evening | SUBCUTANEOUS | 0 refills | Status: DC
Start: 2017-04-02 — End: 2017-04-03

## 2017-04-02 MED ORDER — NPH INSULIN HUMAN RECOMB 100 UNIT/ML (3 ML) SC PEN
30 [IU] | Freq: Every day | SUBCUTANEOUS | 0 refills | Status: DC
Start: 2017-04-02 — End: 2017-04-03

## 2017-04-03 ENCOUNTER — Inpatient Hospital Stay: Admit: 2017-04-03 | Discharge: 2017-04-03 | Payer: MEDICARE

## 2017-04-03 ENCOUNTER — Encounter: Admit: 2017-04-03 | Discharge: 2017-04-03 | Payer: MEDICARE

## 2017-04-03 DIAGNOSIS — R05 Cough: ICD-10-CM

## 2017-04-03 DIAGNOSIS — Z9981 Dependence on supplemental oxygen: ICD-10-CM

## 2017-04-03 DIAGNOSIS — J302 Other seasonal allergic rhinitis: ICD-10-CM

## 2017-04-03 DIAGNOSIS — G4730 Sleep apnea, unspecified: ICD-10-CM

## 2017-04-03 DIAGNOSIS — R0600 Dyspnea, unspecified: ICD-10-CM

## 2017-04-03 DIAGNOSIS — Z8614 Personal history of Methicillin resistant Staphylococcus aureus infection: ICD-10-CM

## 2017-04-03 DIAGNOSIS — Z95828 Presence of other vascular implants and grafts: ICD-10-CM

## 2017-04-03 DIAGNOSIS — E785 Hyperlipidemia, unspecified: ICD-10-CM

## 2017-04-03 DIAGNOSIS — J189 Pneumonia, unspecified organism: ICD-10-CM

## 2017-04-03 DIAGNOSIS — I714 Abdominal aortic aneurysm, without rupture: ICD-10-CM

## 2017-04-03 DIAGNOSIS — M961 Postlaminectomy syndrome, not elsewhere classified: ICD-10-CM

## 2017-04-03 DIAGNOSIS — J45909 Unspecified asthma, uncomplicated: ICD-10-CM

## 2017-04-03 DIAGNOSIS — I5042 Chronic combined systolic (congestive) and diastolic (congestive) heart failure: ICD-10-CM

## 2017-04-03 DIAGNOSIS — E782 Mixed hyperlipidemia: ICD-10-CM

## 2017-04-03 DIAGNOSIS — J479 Bronchiectasis, uncomplicated: ICD-10-CM

## 2017-04-03 DIAGNOSIS — Z8679 Personal history of other diseases of the circulatory system: ICD-10-CM

## 2017-04-03 DIAGNOSIS — I509 Heart failure, unspecified: ICD-10-CM

## 2017-04-03 DIAGNOSIS — I2510 Atherosclerotic heart disease of native coronary artery without angina pectoris: Secondary | ICD-10-CM

## 2017-04-03 DIAGNOSIS — L03116 Cellulitis of left lower limb: ICD-10-CM

## 2017-04-03 DIAGNOSIS — N183 Chronic kidney disease, stage 3 (moderate): ICD-10-CM

## 2017-04-03 DIAGNOSIS — R609 Edema, unspecified: ICD-10-CM

## 2017-04-03 DIAGNOSIS — G4733 Obstructive sleep apnea (adult) (pediatric): ICD-10-CM

## 2017-04-03 DIAGNOSIS — M954 Acquired deformity of chest and rib: ICD-10-CM

## 2017-04-03 DIAGNOSIS — K219 Gastro-esophageal reflux disease without esophagitis: ICD-10-CM

## 2017-04-03 DIAGNOSIS — J449 Chronic obstructive pulmonary disease, unspecified: ICD-10-CM

## 2017-04-03 DIAGNOSIS — I429 Cardiomyopathy, unspecified: ICD-10-CM

## 2017-04-03 DIAGNOSIS — I10 Essential (primary) hypertension: ICD-10-CM

## 2017-04-03 DIAGNOSIS — E669 Obesity, unspecified: ICD-10-CM

## 2017-04-03 LAB — CBC AND DIFF
Lab: 1.5 K/UL — ABNORMAL LOW (ref 1.0–4.8)
Lab: 103 FL — ABNORMAL HIGH (ref 80–100)
Lab: 12 K/UL — ABNORMAL HIGH (ref >60)
Lab: 3.5 M/UL — ABNORMAL LOW (ref >60)

## 2017-04-03 LAB — POC GLUCOSE
Lab: 254 mg/dL — ABNORMAL HIGH (ref 70–100)
Lab: 229 mg/dL — ABNORMAL HIGH (ref 70–100)
Lab: 116 mg/dL — ABNORMAL HIGH (ref 70–100)
Lab: 142 mg/dL — ABNORMAL HIGH (ref 70–100)
Lab: 220 mg/dL — ABNORMAL HIGH (ref 70–100)

## 2017-04-03 LAB — POC ACTIVATED CLOTTING TIME
Lab: 145 s
Lab: >40 s
Lab: 297 s

## 2017-04-03 LAB — BASIC METABOLIC PANEL
Lab: 1.3 mg/dL — ABNORMAL HIGH (ref 0.4–1.24)
Lab: 138 MMOL/L — ABNORMAL LOW (ref 137–147)
Lab: 25 MMOL/L — ABNORMAL HIGH (ref 21–30)
Lab: 4 MMOL/L — ABNORMAL HIGH (ref 3.5–5.1)
Lab: 59 mg/dL — ABNORMAL HIGH (ref 7–25)

## 2017-04-03 MED ORDER — NPH INSULIN HUMAN RECOMB 100 UNIT/ML (3 ML) SC PEN
18 [IU] | Freq: Every day | SUBCUTANEOUS | 0 refills | Status: DC
Start: 2017-04-03 — End: 2017-04-03

## 2017-04-03 MED ORDER — INSULIN GLARGINE 100 UNIT/ML (3 ML) SC INJ PEN
15 [IU] | Freq: Every evening | SUBCUTANEOUS | 0 refills | Status: DC
Start: 2017-04-03 — End: 2017-04-04

## 2017-04-03 MED ORDER — PERFLUTREN LIPID MICROSPHERES 1.1 MG/ML IV SUSP
1-20 mL | Freq: Once | INTRAVENOUS | 0 refills | Status: CP
Start: 2017-04-03 — End: ?
  Administered 2017-04-03: 15:00:00 2 mL via INTRAVENOUS

## 2017-04-03 MED ORDER — BUMETANIDE 0.25 MG/ML IJ SOLN
2 mg | Freq: Two times a day (BID) | INTRAVENOUS | 0 refills | Status: DC
Start: 2017-04-03 — End: 2017-04-06
  Administered 2017-04-04 – 2017-04-05 (×4): 2 mg via INTRAVENOUS

## 2017-04-03 MED ORDER — BUMETANIDE 0.25 MG/ML IJ SOLN
2 mg | Freq: Once | INTRAVENOUS | 0 refills | Status: CP
Start: 2017-04-03 — End: ?
  Administered 2017-04-03: 15:00:00 2 mg via INTRAVENOUS

## 2017-04-03 MED ORDER — BUMETANIDE 0.25 MG/ML IJ SOLN
2 mg | Freq: Once | INTRAVENOUS | 0 refills | Status: CP
Start: 2017-04-03 — End: ?
  Administered 2017-04-03: 20:00:00 2 mg via INTRAVENOUS

## 2017-04-03 MED ORDER — PREDNISONE 50 MG PO TAB
50 mg | Freq: Every day | ORAL | 0 refills | Status: CP
Start: 2017-04-03 — End: ?
  Administered 2017-04-05 – 2017-04-07 (×3): 50 mg via ORAL

## 2017-04-03 MED ORDER — INSULIN ASPART 100 UNIT/ML SC FLEXPEN
0-12 [IU] | Freq: Before meals | SUBCUTANEOUS | 0 refills | Status: DC
Start: 2017-04-03 — End: 2017-04-06

## 2017-04-03 MED ORDER — INSULIN ASPART 100 UNIT/ML SC FLEXPEN
5 [IU] | Freq: Three times a day (TID) | SUBCUTANEOUS | 0 refills | Status: DC
Start: 2017-04-03 — End: 2017-04-05

## 2017-04-04 LAB — BASIC METABOLIC PANEL
Lab: 1.5 mg/dL — ABNORMAL HIGH (ref 0.4–1.24)
Lab: 133 MMOL/L — ABNORMAL LOW (ref 137–147)
Lab: 28 MMOL/L (ref 21–30)
Lab: 295 mg/dL — ABNORMAL HIGH (ref 70–100)
Lab: 45 mL/min — ABNORMAL LOW (ref >60)
Lab: 55 mL/min — ABNORMAL LOW (ref >60)
Lab: 57 mg/dL — ABNORMAL HIGH (ref 7–25)
Lab: 8 (ref 3–12)
Lab: 8.5 mg/dL (ref 8.5–10.6)
Lab: 97 MMOL/L — ABNORMAL LOW (ref 98–110)
Lab: 1.2 mg/dL — ABNORMAL HIGH (ref >60)
Lab: 135 MMOL/L — ABNORMAL LOW (ref 137–147)

## 2017-04-04 LAB — CBC AND DIFF
Lab: 1.2 K/UL — ABNORMAL LOW (ref 1.0–4.8)
Lab: 103 FL — ABNORMAL HIGH (ref 80–100)
Lab: 3.3 M/UL — ABNORMAL LOW (ref 4.4–5.5)

## 2017-04-04 LAB — POC GLUCOSE
Lab: 126 mg/dL — ABNORMAL HIGH (ref 70–100)
Lab: 200 mg/dL — ABNORMAL HIGH (ref 70–100)

## 2017-04-04 LAB — HEMOGLOBIN A1C: Lab: 6.8 % — ABNORMAL HIGH (ref 4.0–6.0)

## 2017-04-04 MED ORDER — INSULIN GLARGINE 100 UNIT/ML (3 ML) SC INJ PEN
12 [IU] | Freq: Every evening | SUBCUTANEOUS | 0 refills | Status: DC
Start: 2017-04-04 — End: 2017-04-05

## 2017-04-04 MED ORDER — PREDNISONE 20 MG PO TAB
40 mg | Freq: Every day | ORAL | 0 refills | Status: DC
Start: 2017-04-04 — End: 2017-04-08
  Administered 2017-04-08: 15:00:00 40 mg via ORAL

## 2017-04-05 LAB — POC GLUCOSE
Lab: 213 mg/dL — ABNORMAL HIGH (ref 70–100)
Lab: 99 mg/dL (ref 70–100)
Lab: 121 mg/dL — ABNORMAL HIGH (ref 70–100)
Lab: 174 mg/dL — ABNORMAL HIGH (ref 70–100)

## 2017-04-05 LAB — BASIC METABOLIC PANEL: Lab: 6 mg/dL — ABNORMAL LOW (ref 3–12)

## 2017-04-05 MED ORDER — INSULIN GLARGINE 100 UNIT/ML (3 ML) SC INJ PEN
8 [IU] | Freq: Every evening | SUBCUTANEOUS | 0 refills | Status: DC
Start: 2017-04-05 — End: 2017-04-07
  Administered 2017-04-06: 04:00:00 8 [IU] via SUBCUTANEOUS

## 2017-04-05 MED ORDER — INSULIN GLARGINE 100 UNIT/ML (3 ML) SC INJ PEN
10 [IU] | Freq: Every evening | SUBCUTANEOUS | 0 refills | Status: DC
Start: 2017-04-05 — End: 2017-04-06

## 2017-04-05 MED ORDER — INSULIN ASPART 100 UNIT/ML SC FLEXPEN
0-6 [IU] | Freq: Before meals | SUBCUTANEOUS | 0 refills | Status: DC
Start: 2017-04-05 — End: 2017-04-08

## 2017-04-05 MED ORDER — BUMETANIDE 2 MG PO TAB
2 mg | Freq: Two times a day (BID) | ORAL | 0 refills | Status: DC
Start: 2017-04-05 — End: 2017-04-08
  Administered 2017-04-06 – 2017-04-07 (×4): 2 mg via ORAL

## 2017-04-05 MED ORDER — INSULIN ASPART 100 UNIT/ML SC FLEXPEN
4 [IU] | Freq: Three times a day (TID) | SUBCUTANEOUS | 0 refills | Status: DC
Start: 2017-04-05 — End: 2017-04-08

## 2017-04-05 MED ORDER — HEPARIN, PORCINE (PF) 5,000 UNIT/0.5 ML IJ SYRG
5000 [IU] | SUBCUTANEOUS | 0 refills | Status: DC
Start: 2017-04-05 — End: 2017-04-08
  Administered 2017-04-06 – 2017-04-08 (×8): 5000 [IU] via SUBCUTANEOUS

## 2017-04-06 LAB — BASIC METABOLIC PANEL: Lab: 136 MMOL/L — ABNORMAL LOW (ref 137–147)

## 2017-04-06 LAB — CBC AND DIFF
Lab: 0 % (ref 0–2)
Lab: 0 % (ref 0–5)
Lab: 0 10*3/uL (ref 0–0.20)
Lab: 0 10*3/uL (ref 0–0.45)
Lab: 0.6 10*3/uL (ref 0–0.80)
Lab: 1.1 10*3/uL (ref 1.0–4.8)
Lab: 103 FL — ABNORMAL HIGH (ref 80–100)
Lab: 12 g/dL — ABNORMAL LOW (ref 13.5–16.5)
Lab: 14 % — ABNORMAL LOW (ref 11–15)
Lab: 164 K/UL — ABNORMAL LOW (ref >60)
Lab: 34 g/dL — ABNORMAL HIGH (ref 32.0–36.0)
Lab: 35 pg — ABNORMAL HIGH (ref 26–34)
Lab: 36 % — ABNORMAL LOW (ref 40–50)
Lab: 6.3 10*3/uL (ref 1.8–7.0)
Lab: 79 % — ABNORMAL HIGH (ref 41–77)
Lab: 8 % (ref 4–12)
Lab: 8 K/UL (ref 4.5–11.0)
Lab: 8.7 FL (ref >60)

## 2017-04-06 LAB — POC GLUCOSE
Lab: 244 mg/dL — ABNORMAL HIGH (ref 70–100)
Lab: 94 mg/dL (ref 70–100)
Lab: 151 mg/dL — ABNORMAL HIGH (ref 70–100)
Lab: 221 mg/dL — ABNORMAL HIGH (ref 70–100)

## 2017-04-06 LAB — MAGNESIUM: Lab: 2.3 mg/dL — ABNORMAL LOW (ref 1.6–2.6)

## 2017-04-06 LAB — VITAMIN B12: Lab: 278 pg/mL (ref 180–914)

## 2017-04-06 MED ORDER — INSULIN GLARGINE 100 UNIT/ML (3 ML) SC INJ PEN
7 [IU] | Freq: Every evening | SUBCUTANEOUS | 0 refills | Status: DC
Start: 2017-04-06 — End: 2017-04-07

## 2017-04-06 MED ORDER — CYANOCOBALAMIN (VITAMIN B-12) 1,000 MCG/ML IJ SOLN
1000 ug | Freq: Once | INTRAMUSCULAR | 0 refills | Status: CP
Start: 2017-04-06 — End: ?
  Administered 2017-04-06: 1000 ug via INTRAMUSCULAR

## 2017-04-07 LAB — BASIC METABOLIC PANEL: Lab: 136 MMOL/L — ABNORMAL LOW (ref >60)

## 2017-04-07 LAB — MAGNESIUM: Lab: 2.3 mg/dL — ABNORMAL LOW (ref 1.6–2.6)

## 2017-04-07 LAB — POC GLUCOSE
Lab: 268 mg/dL — ABNORMAL HIGH (ref 70–100)
Lab: 112 mg/dL — ABNORMAL HIGH (ref 70–100)
Lab: 162 mg/dL — ABNORMAL HIGH (ref 70–100)
Lab: 217 mg/dL — ABNORMAL HIGH (ref 70–100)

## 2017-04-07 LAB — CBC AND DIFF: Lab: 9.3 10*3/uL — ABNORMAL HIGH (ref 4.5–11.0)

## 2017-04-07 MED ORDER — ATORVASTATIN 40 MG PO TAB
40 mg | Freq: Every day | ORAL | 0 refills | Status: DC
Start: 2017-04-07 — End: 2017-04-08
  Administered 2017-04-08: 15:00:00 40 mg via ORAL

## 2017-04-07 MED ORDER — INSULIN GLARGINE 100 UNIT/ML (3 ML) SC INJ PEN
5 [IU] | Freq: Every evening | SUBCUTANEOUS | 0 refills | Status: DC
Start: 2017-04-07 — End: 2017-04-08

## 2017-04-07 MED ORDER — PREDNISONE 10 MG PO TAB
ORAL_TABLET | Freq: Every day | 0 refills | Status: CN
Start: 2017-04-07 — End: ?

## 2017-04-08 ENCOUNTER — Inpatient Hospital Stay: Admit: 2017-03-29 | Discharge: 2017-03-29 | Payer: MEDICARE

## 2017-04-08 ENCOUNTER — Ambulatory Visit: Admit: 2017-03-27 | Discharge: 2017-03-28 | Payer: MEDICARE

## 2017-04-08 ENCOUNTER — Inpatient Hospital Stay: Admit: 2017-03-27 | Discharge: 2017-03-27 | Payer: MEDICARE

## 2017-04-08 ENCOUNTER — Inpatient Hospital Stay: Admit: 2017-03-28 | Discharge: 2017-03-28 | Payer: MEDICARE

## 2017-04-08 ENCOUNTER — Inpatient Hospital Stay: Admit: 2017-04-02 | Discharge: 2017-04-03 | Payer: MEDICARE

## 2017-04-08 ENCOUNTER — Encounter: Admit: 2017-04-08 | Discharge: 2017-04-08 | Payer: MEDICARE

## 2017-04-08 ENCOUNTER — Inpatient Hospital Stay
Admit: 2017-03-28 | Discharge: 2017-04-08 | Disposition: A | Discharge status: Home / Self Care | Payer: MEDICARE | Attending: Cardiovascular Disease

## 2017-04-08 ENCOUNTER — Inpatient Hospital Stay: Admit: 2017-03-30 | Discharge: 2017-03-30 | Payer: MEDICARE

## 2017-04-08 DIAGNOSIS — I340 Nonrheumatic mitral (valve) insufficiency: ICD-10-CM

## 2017-04-08 DIAGNOSIS — Z9049 Acquired absence of other specified parts of digestive tract: ICD-10-CM

## 2017-04-08 DIAGNOSIS — Z7902 Long term (current) use of antithrombotics/antiplatelets: ICD-10-CM

## 2017-04-08 DIAGNOSIS — N183 Chronic kidney disease, stage 3 (moderate): ICD-10-CM

## 2017-04-08 DIAGNOSIS — Z79899 Other long term (current) drug therapy: ICD-10-CM

## 2017-04-08 DIAGNOSIS — B9729 Other coronavirus as the cause of diseases classified elsewhere: ICD-10-CM

## 2017-04-08 DIAGNOSIS — D539 Nutritional anemia, unspecified: ICD-10-CM

## 2017-04-08 DIAGNOSIS — I5043 Acute on chronic combined systolic (congestive) and diastolic (congestive) heart failure: ICD-10-CM

## 2017-04-08 DIAGNOSIS — I2510 Atherosclerotic heart disease of native coronary artery without angina pectoris: ICD-10-CM

## 2017-04-08 DIAGNOSIS — I9751 Accidental puncture and laceration of a circulatory system organ or structure during a circulatory system procedure: ICD-10-CM

## 2017-04-08 DIAGNOSIS — I2582 Chronic total occlusion of coronary artery: ICD-10-CM

## 2017-04-08 DIAGNOSIS — G4733 Obstructive sleep apnea (adult) (pediatric): ICD-10-CM

## 2017-04-08 DIAGNOSIS — Z6841 Body Mass Index (BMI) 40.0 and over, adult: ICD-10-CM

## 2017-04-08 DIAGNOSIS — Z95810 Presence of automatic (implantable) cardiac defibrillator: ICD-10-CM

## 2017-04-08 DIAGNOSIS — G8929 Other chronic pain: ICD-10-CM

## 2017-04-08 DIAGNOSIS — Z87891 Personal history of nicotine dependence: ICD-10-CM

## 2017-04-08 DIAGNOSIS — R739 Hyperglycemia, unspecified: ICD-10-CM

## 2017-04-08 DIAGNOSIS — I313 Pericardial effusion (noninflammatory): ICD-10-CM

## 2017-04-08 DIAGNOSIS — Z8614 Personal history of Methicillin resistant Staphylococcus aureus infection: ICD-10-CM

## 2017-04-08 DIAGNOSIS — T380X5A Adverse effect of glucocorticoids and synthetic analogues, initial encounter: ICD-10-CM

## 2017-04-08 DIAGNOSIS — J4541 Moderate persistent asthma with (acute) exacerbation: ICD-10-CM

## 2017-04-08 DIAGNOSIS — Z7982 Long term (current) use of aspirin: ICD-10-CM

## 2017-04-08 DIAGNOSIS — D509 Iron deficiency anemia, unspecified: ICD-10-CM

## 2017-04-08 DIAGNOSIS — J9601 Acute respiratory failure with hypoxia: ICD-10-CM

## 2017-04-08 DIAGNOSIS — J441 Chronic obstructive pulmonary disease with (acute) exacerbation: ICD-10-CM

## 2017-04-08 DIAGNOSIS — I130 Hypertensive heart and chronic kidney disease with heart failure and stage 1 through stage 4 chronic kidney disease, or unspecified chronic kidney disease: Principal | ICD-10-CM

## 2017-04-08 DIAGNOSIS — I714 Abdominal aortic aneurysm, without rupture: ICD-10-CM

## 2017-04-08 DIAGNOSIS — I255 Ischemic cardiomyopathy: ICD-10-CM

## 2017-04-08 DIAGNOSIS — I2542 Coronary artery dissection: ICD-10-CM

## 2017-04-08 DIAGNOSIS — M545 Low back pain: ICD-10-CM

## 2017-04-08 DIAGNOSIS — F329 Major depressive disorder, single episode, unspecified: ICD-10-CM

## 2017-04-08 DIAGNOSIS — E782 Mixed hyperlipidemia: ICD-10-CM

## 2017-04-08 LAB — CBC AND DIFF: Lab: 8.7 K/UL — ABNORMAL LOW (ref 4.5–11.0)

## 2017-04-08 LAB — MAGNESIUM: Lab: 2.3 mg/dL — ABNORMAL LOW (ref >60)

## 2017-04-08 LAB — POC GLUCOSE
Lab: 203 mg/dL — ABNORMAL HIGH (ref 70–100)
Lab: 105 mg/dL — ABNORMAL HIGH (ref 70–100)
Lab: 161 mg/dL — ABNORMAL HIGH (ref 70–100)

## 2017-04-08 LAB — BASIC METABOLIC PANEL: Lab: 136 MMOL/L — ABNORMAL LOW (ref 137–147)

## 2017-04-08 MED ORDER — INSULIN ASPART U-100 100 UNIT/ML SC SOLN
4 [IU] | Freq: Three times a day (TID) | SUBCUTANEOUS | 3 refills | 42.00000 days | Status: AC
Start: 2017-04-08 — End: 2017-04-08
  Filled 2017-04-08: qty 45, 125d supply

## 2017-04-08 MED ORDER — BUMETANIDE 2 MG PO TAB
1 mg | Freq: Every day | ORAL | 0 refills | Status: DC
Start: 2017-04-08 — End: 2017-04-08

## 2017-04-08 MED ORDER — NITROGLYCERIN 0.4 MG SL SUBL
.4 mg | ORAL_TABLET | SUBLINGUAL | 3 refills | Status: CN | PRN
Start: 2017-04-08 — End: ?

## 2017-04-08 MED ORDER — INSULIN GLARGINE 100 UNIT/ML (3 ML) SC INJ PEN
5 [IU] | Freq: Every evening | SUBCUTANEOUS | 3 refills | 60.00000 days | Status: AC
Start: 2017-04-08 — End: 2017-04-08
  Filled 2017-04-08: qty 45, 300d supply

## 2017-04-08 MED ORDER — ASPIRIN 81 MG PO TBEC
81 mg | ORAL_TABLET | Freq: Every day | ORAL | 3 refills | Status: AC
Start: 2017-04-08 — End: ?

## 2017-04-08 MED ORDER — BUMETANIDE 2 MG PO TAB
2 mg | Freq: Every day | ORAL | 0 refills | Status: DC
Start: 2017-04-08 — End: 2017-04-08

## 2017-04-08 MED ORDER — ATORVASTATIN 40 MG PO TAB
40 mg | ORAL_TABLET | Freq: Every day | ORAL | 3 refills | Status: AC
Start: 2017-04-08 — End: 2018-04-24
  Filled 2017-04-08 (×2): qty 90, 90d supply, fill #1

## 2017-04-08 MED ORDER — ACETAMINOPHEN 325 MG PO TAB
650 mg | ORAL | 0 refills | Status: AC | PRN
Start: 2017-04-08 — End: 2017-04-13

## 2017-04-08 MED ORDER — NPH INSULIN HUMAN RECOMB 100 UNIT/ML (3 ML) SC PEN
3 [IU] | Freq: Two times a day (BID) | SUBCUTANEOUS | 3 refills | Status: AC
Start: 2017-04-08 — End: 2017-04-19

## 2017-04-08 MED ORDER — INSULIN REGULAR HUMAN 100 UNIT/ML IJ SOLN
3 [IU] | Freq: Three times a day (TID) | SUBCUTANEOUS | 0 refills | Status: SS
Start: 2017-04-08 — End: 2017-05-04

## 2017-04-08 MED ORDER — PREDNISONE 10 MG PO TAB
ORAL_TABLET | Freq: Every day | ORAL | 0 refills | Status: AC
Start: 2017-04-08 — End: 2017-04-19
  Filled 2017-04-08 (×2): qty 26, 11d supply, fill #1

## 2017-04-08 MED ORDER — BENZONATATE 100 MG PO CAP
200 mg | ORAL_CAPSULE | ORAL | 0 refills | 9.00000 days | Status: AC | PRN
Start: 2017-04-08 — End: 2017-04-23
  Filled 2017-04-08 (×2): qty 20, 4d supply, fill #1

## 2017-04-08 MED ORDER — GUAIFENESIN 600 MG PO TA12
600 mg | ORAL_TABLET | Freq: Two times a day (BID) | ORAL | 0 refills | 14.00000 days | Status: AC
Start: 2017-04-08 — End: 2017-04-13

## 2017-04-08 MED ORDER — CLOPIDOGREL 75 MG PO TAB
75 mg | ORAL_TABLET | Freq: Every day | ORAL | 3 refills | Status: SS
Start: 2017-04-08 — End: 2017-06-30

## 2017-04-08 MED ORDER — SPIRONOLACTONE 25 MG PO TAB
25 mg | ORAL_TABLET | Freq: Two times a day (BID) | ORAL | 3 refills | Status: CN
Start: 2017-04-08 — End: ?

## 2017-04-08 MED ORDER — BUMETANIDE 2 MG PO TAB
2 mg | Freq: Every day | ORAL | 0 refills | Status: DC
Start: 2017-04-08 — End: 2017-04-08
  Administered 2017-04-08: 15:00:00 2 mg via ORAL

## 2017-04-08 MED ORDER — BUMETANIDE 2 MG PO TAB
2 mg | ORAL_TABLET | Freq: Every day | ORAL | 3 refills | Status: SS
Start: 2017-04-08 — End: 2017-05-21

## 2017-04-08 MED ORDER — BUMETANIDE 1 MG PO TAB
1 mg | ORAL_TABLET | Freq: Every day | ORAL | 3 refills | Status: AC
Start: 2017-04-08 — End: 2017-05-21
  Filled 2017-04-08 (×2): qty 90, 30d supply, fill #1

## 2017-04-08 MED ORDER — INSULIN SYRINGE-NEEDLE U-100 0.3 ML 31 GAUGE X 5/16" MISC SYRG
3 refills | Status: SS
Start: 2017-04-08 — End: 2017-06-29

## 2017-04-08 MED FILL — CLOPIDOGREL 75 MG PO TAB: 75 mg | ORAL | 90 days supply | Qty: 90 | Fill #1 | Status: CP

## 2017-04-08 MED FILL — INSULIN SYRINGE-NEEDLE U-100 0.3 ML 31 GAUGE X 5/16" MISC SYRG: 0.3 mL 31 gauge x 5/16" | 20 days supply | Qty: 100 | Status: CN

## 2017-04-09 ENCOUNTER — Encounter: Admit: 2017-04-09 | Discharge: 2017-04-09 | Payer: MEDICARE

## 2017-04-13 ENCOUNTER — Encounter: Admit: 2017-04-13 | Discharge: 2017-04-13 | Payer: MEDICARE

## 2017-04-13 DIAGNOSIS — I509 Heart failure, unspecified: ICD-10-CM

## 2017-04-13 DIAGNOSIS — L03116 Cellulitis of left lower limb: ICD-10-CM

## 2017-04-13 DIAGNOSIS — J479 Bronchiectasis, uncomplicated: ICD-10-CM

## 2017-04-13 DIAGNOSIS — G4733 Obstructive sleep apnea (adult) (pediatric): ICD-10-CM

## 2017-04-13 DIAGNOSIS — R05 Cough: ICD-10-CM

## 2017-04-13 DIAGNOSIS — E782 Mixed hyperlipidemia: ICD-10-CM

## 2017-04-13 DIAGNOSIS — Z95828 Presence of other vascular implants and grafts: ICD-10-CM

## 2017-04-13 DIAGNOSIS — R0600 Dyspnea, unspecified: ICD-10-CM

## 2017-04-13 DIAGNOSIS — I10 Essential (primary) hypertension: Secondary | ICD-10-CM

## 2017-04-13 DIAGNOSIS — I429 Cardiomyopathy, unspecified: ICD-10-CM

## 2017-04-13 DIAGNOSIS — I5042 Chronic combined systolic (congestive) and diastolic (congestive) heart failure: ICD-10-CM

## 2017-04-13 DIAGNOSIS — I714 Abdominal aortic aneurysm, without rupture: ICD-10-CM

## 2017-04-13 DIAGNOSIS — M954 Acquired deformity of chest and rib: ICD-10-CM

## 2017-04-13 DIAGNOSIS — Z8614 Personal history of Methicillin resistant Staphylococcus aureus infection: ICD-10-CM

## 2017-04-13 DIAGNOSIS — E669 Obesity, unspecified: ICD-10-CM

## 2017-04-13 DIAGNOSIS — K219 Gastro-esophageal reflux disease without esophagitis: ICD-10-CM

## 2017-04-13 DIAGNOSIS — J189 Pneumonia, unspecified organism: ICD-10-CM

## 2017-04-13 DIAGNOSIS — Z8679 Personal history of other diseases of the circulatory system: ICD-10-CM

## 2017-04-13 DIAGNOSIS — R609 Edema, unspecified: ICD-10-CM

## 2017-04-13 DIAGNOSIS — E785 Hyperlipidemia, unspecified: ICD-10-CM

## 2017-04-13 DIAGNOSIS — J302 Other seasonal allergic rhinitis: ICD-10-CM

## 2017-04-13 DIAGNOSIS — N183 Chronic kidney disease, stage 3 (moderate): ICD-10-CM

## 2017-04-13 DIAGNOSIS — I2510 Atherosclerotic heart disease of native coronary artery without angina pectoris: Secondary | ICD-10-CM

## 2017-04-13 DIAGNOSIS — M961 Postlaminectomy syndrome, not elsewhere classified: ICD-10-CM

## 2017-04-13 DIAGNOSIS — J45909 Unspecified asthma, uncomplicated: ICD-10-CM

## 2017-04-13 DIAGNOSIS — Z9981 Dependence on supplemental oxygen: ICD-10-CM

## 2017-04-13 DIAGNOSIS — G4730 Sleep apnea, unspecified: ICD-10-CM

## 2017-04-13 DIAGNOSIS — J449 Chronic obstructive pulmonary disease, unspecified: ICD-10-CM

## 2017-04-14 ENCOUNTER — Ambulatory Visit: Admit: 2017-04-13 | Discharge: 2017-04-14 | Payer: MEDICARE

## 2017-04-14 DIAGNOSIS — Z959 Presence of cardiac and vascular implant and graft, unspecified: ICD-10-CM

## 2017-04-14 DIAGNOSIS — I5042 Chronic combined systolic (congestive) and diastolic (congestive) heart failure: Principal | ICD-10-CM

## 2017-04-14 DIAGNOSIS — I2510 Atherosclerotic heart disease of native coronary artery without angina pectoris: ICD-10-CM

## 2017-04-19 ENCOUNTER — Ambulatory Visit: Admit: 2017-04-19 | Discharge: 2017-04-19 | Payer: MEDICARE

## 2017-04-19 ENCOUNTER — Ambulatory Visit: Admit: 2017-04-19 | Discharge: 2017-04-20 | Payer: MEDICARE

## 2017-04-19 ENCOUNTER — Encounter: Admit: 2017-04-19 | Discharge: 2017-04-19 | Payer: MEDICARE

## 2017-04-19 DIAGNOSIS — J0101 Acute recurrent maxillary sinusitis: ICD-10-CM

## 2017-04-19 DIAGNOSIS — I509 Heart failure, unspecified: ICD-10-CM

## 2017-04-19 DIAGNOSIS — E785 Hyperlipidemia, unspecified: ICD-10-CM

## 2017-04-19 DIAGNOSIS — E782 Mixed hyperlipidemia: ICD-10-CM

## 2017-04-19 DIAGNOSIS — R05 Cough: ICD-10-CM

## 2017-04-19 DIAGNOSIS — D801 Nonfamilial hypogammaglobulinemia: ICD-10-CM

## 2017-04-19 DIAGNOSIS — J479 Bronchiectasis, uncomplicated: ICD-10-CM

## 2017-04-19 DIAGNOSIS — I5042 Chronic combined systolic (congestive) and diastolic (congestive) heart failure: ICD-10-CM

## 2017-04-19 DIAGNOSIS — I2510 Atherosclerotic heart disease of native coronary artery without angina pectoris: Secondary | ICD-10-CM

## 2017-04-19 DIAGNOSIS — Z8679 Personal history of other diseases of the circulatory system: ICD-10-CM

## 2017-04-19 DIAGNOSIS — R609 Edema, unspecified: ICD-10-CM

## 2017-04-19 DIAGNOSIS — Z95828 Presence of other vascular implants and grafts: ICD-10-CM

## 2017-04-19 DIAGNOSIS — I429 Cardiomyopathy, unspecified: ICD-10-CM

## 2017-04-19 DIAGNOSIS — R739 Hyperglycemia, unspecified: ICD-10-CM

## 2017-04-19 DIAGNOSIS — R0600 Dyspnea, unspecified: ICD-10-CM

## 2017-04-19 DIAGNOSIS — J45909 Unspecified asthma, uncomplicated: ICD-10-CM

## 2017-04-19 DIAGNOSIS — K219 Gastro-esophageal reflux disease without esophagitis: ICD-10-CM

## 2017-04-19 DIAGNOSIS — G4730 Sleep apnea, unspecified: ICD-10-CM

## 2017-04-19 DIAGNOSIS — Z8614 Personal history of Methicillin resistant Staphylococcus aureus infection: ICD-10-CM

## 2017-04-19 DIAGNOSIS — I714 Abdominal aortic aneurysm, without rupture: ICD-10-CM

## 2017-04-19 DIAGNOSIS — L03116 Cellulitis of left lower limb: ICD-10-CM

## 2017-04-19 DIAGNOSIS — M954 Acquired deformity of chest and rib: ICD-10-CM

## 2017-04-19 DIAGNOSIS — Z9981 Dependence on supplemental oxygen: ICD-10-CM

## 2017-04-19 DIAGNOSIS — J189 Pneumonia, unspecified organism: ICD-10-CM

## 2017-04-19 DIAGNOSIS — J449 Chronic obstructive pulmonary disease, unspecified: ICD-10-CM

## 2017-04-19 DIAGNOSIS — E669 Obesity, unspecified: ICD-10-CM

## 2017-04-19 DIAGNOSIS — Z09 Encounter for follow-up examination after completed treatment for conditions other than malignant neoplasm: Principal | ICD-10-CM

## 2017-04-19 DIAGNOSIS — I10 Essential (primary) hypertension: Principal | ICD-10-CM

## 2017-04-19 DIAGNOSIS — G4733 Obstructive sleep apnea (adult) (pediatric): ICD-10-CM

## 2017-04-19 DIAGNOSIS — N183 Chronic kidney disease, stage 3 (moderate): ICD-10-CM

## 2017-04-19 DIAGNOSIS — J302 Other seasonal allergic rhinitis: ICD-10-CM

## 2017-04-19 DIAGNOSIS — M961 Postlaminectomy syndrome, not elsewhere classified: ICD-10-CM

## 2017-04-19 LAB — IMMUNOGLOBULIN G (IGG): Lab: 472 mg/dL — ABNORMAL LOW (ref >60)

## 2017-04-19 LAB — BASIC METABOLIC PANEL
Lab: 136 MMOL/L — ABNORMAL LOW (ref 137–147)
Lab: 60 mL/min — ABNORMAL LOW (ref >60)

## 2017-04-19 MED ORDER — AZITHROMYCIN 250 MG PO TAB
ORAL_TABLET | Freq: Every day | 0 refills | Status: SS
Start: 2017-04-19 — End: 2017-05-04

## 2017-04-20 LAB — ELECTROPHORESIS-SERUM PROTEIN
Lab: 16 % — ABNORMAL HIGH (ref >60)
Lab: 57 % (ref 48–68)
Lab: 6.8 g/dL (ref 6.0–8.0)

## 2017-04-20 LAB — IMMUNOFIXATION, SERUM (IFES)

## 2017-04-23 ENCOUNTER — Encounter: Admit: 2017-04-23 | Discharge: 2017-04-23 | Payer: MEDICARE

## 2017-04-23 DIAGNOSIS — J479 Bronchiectasis, uncomplicated: ICD-10-CM

## 2017-04-23 DIAGNOSIS — J45901 Unspecified asthma with (acute) exacerbation: ICD-10-CM

## 2017-04-23 DIAGNOSIS — J45909 Unspecified asthma, uncomplicated: ICD-10-CM

## 2017-04-23 DIAGNOSIS — R05 Cough: Principal | ICD-10-CM

## 2017-04-23 MED ORDER — BENZONATATE 100 MG PO CAP
200 mg | ORAL_CAPSULE | ORAL | 0 refills | 9.00000 days | Status: AC | PRN
Start: 2017-04-23 — End: 2019-07-28

## 2017-04-23 MED ORDER — PROMETHAZINE-CODEINE 6.25-10 MG/5 ML PO SYRP
5 mL | ORAL | 0 refills | 7.00000 days | Status: AC | PRN
Start: 2017-04-23 — End: 2017-05-25

## 2017-04-24 ENCOUNTER — Encounter: Admit: 2017-04-24 | Discharge: 2017-04-24 | Payer: MEDICARE

## 2017-04-24 DIAGNOSIS — I5042 Chronic combined systolic (congestive) and diastolic (congestive) heart failure: Principal | ICD-10-CM

## 2017-04-25 ENCOUNTER — Ambulatory Visit: Admit: 2017-04-25 | Discharge: 2017-04-25 | Payer: MEDICARE

## 2017-04-25 ENCOUNTER — Ambulatory Visit: Admit: 2017-04-24 | Discharge: 2017-04-25 | Payer: MEDICARE

## 2017-04-25 DIAGNOSIS — I5042 Chronic combined systolic (congestive) and diastolic (congestive) heart failure: Principal | ICD-10-CM

## 2017-04-26 MED ORDER — DOXYCYCLINE HYCLATE 100 MG PO TAB
100 mg | ORAL_TABLET | Freq: Two times a day (BID) | ORAL | 0 refills | 8.00000 days | Status: AC
Start: 2017-04-26 — End: 2017-05-21

## 2017-05-01 ENCOUNTER — Encounter: Admit: 2017-05-01 | Discharge: 2017-05-01 | Payer: MEDICARE

## 2017-05-02 ENCOUNTER — Encounter: Admit: 2017-05-02 | Discharge: 2017-05-02 | Payer: MEDICARE

## 2017-05-02 ENCOUNTER — Emergency Department: Admit: 2017-05-02 | Discharge: 2017-05-02 | Payer: MEDICARE

## 2017-05-02 ENCOUNTER — Inpatient Hospital Stay: Admit: 2017-05-11 | Discharge: 2017-05-11 | Payer: MEDICARE

## 2017-05-02 DIAGNOSIS — J470 Bronchiectasis with acute lower respiratory infection: ICD-10-CM

## 2017-05-02 DIAGNOSIS — R0602 Shortness of breath: Secondary | ICD-10-CM

## 2017-05-02 DIAGNOSIS — I10 Essential (primary) hypertension: ICD-10-CM

## 2017-05-02 DIAGNOSIS — I5043 Acute on chronic combined systolic (congestive) and diastolic (congestive) heart failure: Secondary | ICD-10-CM

## 2017-05-02 LAB — INFLUENZA A/B AG (RAPID TEST): Lab: NEGATIVE

## 2017-05-02 LAB — COMPREHENSIVE METABOLIC PANEL
Lab: 0.8 mg/dL (ref 0.3–1.2)
Lab: 1.2 mg/dL — ABNORMAL HIGH (ref 0.4–1.24)
Lab: 103 MMOL/L — ABNORMAL LOW (ref 98–110)
Lab: 137 MMOL/L — ABNORMAL LOW (ref 137–147)
Lab: 146 mg/dL — ABNORMAL HIGH (ref >60)
Lab: 16 U/L (ref 7–56)
Lab: 19 U/L (ref 7–40)
Lab: 25 MMOL/L (ref 21–30)
Lab: 29 mg/dL — ABNORMAL HIGH (ref >60)
Lab: 3.7 g/dL (ref 3.5–5.0)
Lab: 3.8 MMOL/L — ABNORMAL LOW (ref 3.5–5.1)
Lab: 54 mL/min — ABNORMAL LOW (ref >60)
Lab: 6.9 g/dL (ref 6.0–8.0)
Lab: 76 U/L — ABNORMAL LOW (ref 25–110)
Lab: 9 K/UL (ref 3–12)
Lab: 9.4 mg/dL — ABNORMAL HIGH (ref 8.5–10.6)
Lab: >60 mL/min (ref >60)

## 2017-05-02 LAB — POC BLOOD GAS VEN
Lab: 28 MMOL/L
Lab: 30 mmHg — ABNORMAL LOW (ref 33–48)
Lab: 4 MMOL/L
Lab: 49 mmHg (ref 36–50)
Lab: 54 % — ABNORMAL LOW (ref 55–71)
Lab: 7.3 (ref 7.30–7.40)

## 2017-05-02 LAB — POC TROPONIN: Lab: 0 ng/mL (ref 0.00–0.05)

## 2017-05-02 LAB — POC GLUCOSE: Lab: 155 mg/dL — ABNORMAL HIGH (ref 70–100)

## 2017-05-02 LAB — CBC AND DIFF
Lab: 0 10*3/uL (ref >=60)
Lab: 0.1 10*3/uL (ref >=60)
Lab: 7.4 K/UL (ref 4.5–11.0)

## 2017-05-02 LAB — BNP POC ER: Lab: 148 pg/mL — ABNORMAL HIGH (ref 0–100)

## 2017-05-02 LAB — MAGNESIUM: Lab: 1.5 mg/dL — ABNORMAL LOW (ref 1.6–2.6)

## 2017-05-02 LAB — POC LACTATE: Lab: 2 MMOL/L (ref 0.5–2.0)

## 2017-05-02 MED ORDER — SPIRONOLACTONE 25 MG PO TAB
25 mg | Freq: Two times a day (BID) | ORAL | 0 refills | Status: DC
Start: 2017-05-02 — End: 2017-05-21
  Administered 2017-05-03 – 2017-05-21 (×36): 25 mg via ORAL

## 2017-05-02 MED ORDER — BENZONATATE 100 MG PO CAP
200 mg | ORAL | 0 refills | Status: DC | PRN
Start: 2017-05-02 — End: 2017-05-21
  Administered 2017-05-03 – 2017-05-21 (×12): 200 mg via ORAL

## 2017-05-02 MED ORDER — TAMSULOSIN 0.4 MG PO CAP
0.4 mg | Freq: Every day | ORAL | 0 refills | Status: DC
Start: 2017-05-02 — End: 2017-05-21
  Administered 2017-05-03 – 2017-05-21 (×20): 0.4 mg via ORAL

## 2017-05-02 MED ORDER — BUMETANIDE 0.25 MG/ML IJ SOLN
2 mg | Freq: Every day | INTRAVENOUS | 0 refills | Status: DC
Start: 2017-05-02 — End: 2017-05-03

## 2017-05-02 MED ORDER — ACETAMINOPHEN 325 MG PO TAB
650 mg | ORAL | 0 refills | Status: DC | PRN
Start: 2017-05-02 — End: 2017-05-21
  Administered 2017-05-16: 23:00:00 650 mg via ORAL

## 2017-05-02 MED ORDER — ALBUTEROL SULFATE 2.5 MG/0.5 ML IN NEBU
2.5 mg | Freq: Four times a day (QID) | RESPIRATORY_TRACT | 0 refills | Status: DC | PRN
Start: 2017-05-02 — End: 2017-05-02

## 2017-05-02 MED ORDER — FERROUS SULFATE 325 MG (65 MG IRON) PO TAB
325 mg | Freq: Every day | ORAL | 0 refills | Status: DC
Start: 2017-05-02 — End: 2017-05-21
  Administered 2017-05-03 – 2017-05-21 (×20): 325 mg via ORAL

## 2017-05-02 MED ORDER — METHYLPREDNISOLONE SOD SUC(PF) 125 MG/2 ML IJ SOLR
62.5 mg | Freq: Two times a day (BID) | INTRAVENOUS | 0 refills | Status: DC
Start: 2017-05-02 — End: 2017-05-03
  Administered 2017-05-03 (×2): 62.5 mg via INTRAVENOUS

## 2017-05-02 MED ORDER — ASCORBIC ACID (VITAMIN C) 500 MG PO TAB
500 mg | Freq: Every day | ORAL | 0 refills | Status: DC
Start: 2017-05-02 — End: 2017-05-21
  Administered 2017-05-03 – 2017-05-21 (×20): 500 mg via ORAL

## 2017-05-02 MED ORDER — ALBUTEROL SULFATE 2.5 MG/0.5 ML IN NEBU
2.5 mg | Freq: Four times a day (QID) | RESPIRATORY_TRACT | 0 refills | Status: DC | PRN
Start: 2017-05-02 — End: 2017-05-21
  Administered 2017-05-03 – 2017-05-21 (×74): 2.5 mg via RESPIRATORY_TRACT

## 2017-05-02 MED ORDER — METHYLPREDNISOLONE SOD SUC(PF) 125 MG/2 ML IJ SOLR
62.5 mg | Freq: Once | INTRAVENOUS | 0 refills | Status: CP
Start: 2017-05-02 — End: ?
  Administered 2017-05-02: 22:00:00 62.5 mg via INTRAVENOUS

## 2017-05-02 MED ORDER — FUROSEMIDE 10 MG/ML IJ SOLN
100 mg | Freq: Once | INTRAVENOUS | 0 refills | Status: CP
Start: 2017-05-02 — End: ?
  Administered 2017-05-02: 21:00:00 100 mg via INTRAVENOUS

## 2017-05-02 MED ORDER — PROMETHAZINE-CODEINE 6.25-10 MG/5 ML PO SYRP
5 mL | ORAL | 0 refills | Status: DC | PRN
Start: 2017-05-02 — End: 2017-05-21
  Administered 2017-05-03 – 2017-05-21 (×14): 5 mL via ORAL

## 2017-05-02 MED ORDER — ATORVASTATIN 40 MG PO TAB
40 mg | Freq: Every day | ORAL | 0 refills | Status: DC
Start: 2017-05-02 — End: 2017-05-21
  Administered 2017-05-03 – 2017-05-21 (×19): 40 mg via ORAL

## 2017-05-02 MED ORDER — CEFEPIME 1G/100ML NS IVPB (MB+)
1 g | INTRAVENOUS | 0 refills | Status: CP
Start: 2017-05-02 — End: ?
  Administered 2017-05-03 – 2017-05-09 (×38): 1 g via INTRAVENOUS

## 2017-05-02 MED ORDER — VANCOMYCIN 2,000 MG IVPB
2 g | Freq: Once | INTRAVENOUS | 0 refills | Status: CP
Start: 2017-05-02 — End: ?
  Administered 2017-05-02 (×2): 2 g via INTRAVENOUS

## 2017-05-02 MED ORDER — GUAIFENESIN 600 MG PO TA12
600 mg | Freq: Every day | ORAL | 0 refills | Status: DC
Start: 2017-05-02 — End: 2017-05-09
  Administered 2017-05-03 – 2017-05-09 (×8): 600 mg via ORAL

## 2017-05-02 MED ORDER — IPRATROPIUM BROMIDE 0.02 % IN SOLN
0.5 mg | RESPIRATORY_TRACT | 0 refills | Status: DC | PRN
Start: 2017-05-02 — End: 2017-05-02

## 2017-05-02 MED ORDER — HEPARIN, PORCINE (PF) 5,000 UNIT/0.5 ML IJ SYRG
5000 [IU] | SUBCUTANEOUS | 0 refills | Status: DC
Start: 2017-05-02 — End: 2017-05-21
  Administered 2017-05-03 – 2017-05-19 (×40): 5000 [IU] via SUBCUTANEOUS

## 2017-05-02 MED ORDER — ASPIRIN 81 MG PO TBEC
81 mg | Freq: Every day | ORAL | 0 refills | Status: DC
Start: 2017-05-02 — End: 2017-05-21
  Administered 2017-05-03 – 2017-05-21 (×19): 81 mg via ORAL

## 2017-05-02 MED ORDER — BUMETANIDE 0.25 MG/ML IJ SOLN
4 mg | Freq: Every day | INTRAVENOUS | 0 refills | Status: DC
Start: 2017-05-02 — End: 2017-05-06
  Administered 2017-05-03 – 2017-05-06 (×4): 4 mg via INTRAVENOUS

## 2017-05-02 MED ORDER — DULOXETINE 60 MG PO CPDR
60 mg | Freq: Every day | ORAL | 0 refills | Status: DC
Start: 2017-05-02 — End: 2017-05-21
  Administered 2017-05-03 – 2017-05-21 (×20): 60 mg via ORAL

## 2017-05-02 MED ORDER — IPRATROPIUM BROMIDE 0.02 % IN SOLN
0.5 mg | Freq: Four times a day (QID) | RESPIRATORY_TRACT | 0 refills | Status: DC | PRN
Start: 2017-05-02 — End: 2017-05-02

## 2017-05-02 MED ORDER — ALBUTEROL SULFATE 2.5 MG/0.5 ML IN NEBU
2.5 mg | RESPIRATORY_TRACT | 0 refills | Status: DC | PRN
Start: 2017-05-02 — End: 2017-05-02

## 2017-05-02 MED ORDER — MONTELUKAST 10 MG PO TAB
10 mg | Freq: Every evening | ORAL | 0 refills | Status: DC
Start: 2017-05-02 — End: 2017-05-21
  Administered 2017-05-03 – 2017-05-21 (×19): 10 mg via ORAL

## 2017-05-02 MED ORDER — GABAPENTIN 400 MG PO CAP
400 mg | ORAL | 0 refills | Status: DC
Start: 2017-05-02 — End: 2017-05-21
  Administered 2017-05-03 – 2017-05-21 (×55): 400 mg via ORAL

## 2017-05-02 MED ORDER — MELATONIN 5 MG PO TAB
5 mg | Freq: Every evening | ORAL | 0 refills | Status: DC
Start: 2017-05-02 — End: 2017-05-21
  Administered 2017-05-03 – 2017-05-21 (×18): 5 mg via ORAL

## 2017-05-02 MED ORDER — CEFEPIME 2G/100ML NS IVPB (MB+)
2 g | Freq: Once | INTRAVENOUS | 0 refills | Status: CP
Start: 2017-05-02 — End: ?
  Administered 2017-05-02 (×2): 2 g via INTRAVENOUS

## 2017-05-02 MED ORDER — BUDESONIDE-FORMOTEROL 160-4.5 MCG/ACTUATION IN HFAA
2 | Freq: Two times a day (BID) | RESPIRATORY_TRACT | 0 refills | Status: DC
Start: 2017-05-02 — End: 2017-05-21
  Administered 2017-05-03 – 2017-05-06 (×3): 2 via RESPIRATORY_TRACT

## 2017-05-02 MED ORDER — CETIRIZINE 10 MG PO TAB
10 mg | Freq: Every morning | ORAL | 0 refills | Status: DC
Start: 2017-05-02 — End: 2017-05-21
  Administered 2017-05-03 – 2017-05-21 (×20): 10 mg via ORAL

## 2017-05-02 MED ORDER — DOXYCYCLINE HYCLATE 100 MG PO TAB
100 mg | Freq: Two times a day (BID) | ORAL | 0 refills | Status: DC
Start: 2017-05-02 — End: 2017-05-03
  Administered 2017-05-03 (×2): 100 mg via ORAL

## 2017-05-02 MED ORDER — METOPROLOL SUCCINATE 25 MG PO TB24
25 mg | Freq: Every day | ORAL | 0 refills | Status: DC
Start: 2017-05-02 — End: 2017-05-21
  Administered 2017-05-03 – 2017-05-21 (×20): 25 mg via ORAL

## 2017-05-02 MED ORDER — IPRATROPIUM BROMIDE 0.02 % IN SOLN
0.5 mg | Freq: Four times a day (QID) | RESPIRATORY_TRACT | 0 refills | Status: DC | PRN
Start: 2017-05-02 — End: 2017-05-21
  Administered 2017-05-03 – 2017-05-21 (×69): 0.5 mg via RESPIRATORY_TRACT

## 2017-05-02 MED ORDER — CLOPIDOGREL 75 MG PO TAB
75 mg | Freq: Every day | ORAL | 0 refills | Status: DC
Start: 2017-05-02 — End: 2017-05-21
  Administered 2017-05-03 – 2017-05-21 (×20): 75 mg via ORAL

## 2017-05-02 MED ORDER — PANTOPRAZOLE 40 MG PO TBEC
40 mg | Freq: Two times a day (BID) | ORAL | 0 refills | Status: DC
Start: 2017-05-02 — End: 2017-05-21
  Administered 2017-05-03 – 2017-05-21 (×35): 40 mg via ORAL

## 2017-05-02 MED ORDER — INSULIN ASPART 100 UNIT/ML SC FLEXPEN
0-6 [IU] | Freq: Before meals | SUBCUTANEOUS | 0 refills | Status: DC
Start: 2017-05-02 — End: 2017-05-04
  Administered 2017-05-03: 03:00:00 4 [IU] via SUBCUTANEOUS

## 2017-05-02 MED ORDER — CHOLECALCIFEROL (VITAMIN D3) 1,000 UNIT PO TAB
1000 [IU] | Freq: Every day | ORAL | 0 refills | Status: DC
Start: 2017-05-02 — End: 2017-05-21
  Administered 2017-05-03 – 2017-05-21 (×20): 1000 [IU] via ORAL

## 2017-05-02 MED ORDER — BECLOMETHASONE DIPROP (AQ) 42 MCG (0.042 %) NA SPRY
1-2 | Freq: Two times a day (BID) | NASAL | 0 refills | Status: DC
Start: 2017-05-02 — End: 2017-05-03
  Administered 2017-05-03: 13:00:00 2 via NASAL

## 2017-05-02 MED ORDER — ALBUTEROL SULFATE 2.5 MG/0.5 ML IN NEBU
2.5 mg | RESPIRATORY_TRACT | 0 refills | Status: DC | PRN
Start: 2017-05-02 — End: 2017-05-02
  Administered 2017-05-02: 23:00:00 2.5 mg via RESPIRATORY_TRACT

## 2017-05-02 MED ORDER — ONDANSETRON HCL (PF) 4 MG/2 ML IJ SOLN
4 mg | INTRAVENOUS | 0 refills | Status: DC | PRN
Start: 2017-05-02 — End: 2017-05-21

## 2017-05-02 MED ORDER — ALLOPURINOL 300 MG PO TAB
300 mg | Freq: Every day | ORAL | 0 refills | Status: DC
Start: 2017-05-02 — End: 2017-05-21
  Administered 2017-05-03 – 2017-05-21 (×20): 300 mg via ORAL

## 2017-05-02 MED ORDER — IPRATROPIUM BROMIDE 0.02 % IN SOLN
0.5 mg | RESPIRATORY_TRACT | 0 refills | Status: DC | PRN
Start: 2017-05-02 — End: 2017-05-02
  Administered 2017-05-02: 23:00:00 0.5 mg via RESPIRATORY_TRACT

## 2017-05-02 MED ADMIN — IPRATROPIUM BROMIDE 0.02 % IN SOLN [12580]: RESPIRATORY_TRACT | @ 21:00:00 | Stop: 2017-05-02 | NDC 00487980101

## 2017-05-02 MED ADMIN — ALBUTEROL SULFATE 2.5 MG/0.5 ML IN NEBU [93139]: 2.5 mg | RESPIRATORY_TRACT | @ 21:00:00 | Stop: 2017-05-02 | NDC 00487990130

## 2017-05-03 ENCOUNTER — Inpatient Hospital Stay: Admit: 2017-05-03 | Discharge: 2017-05-03 | Payer: MEDICARE

## 2017-05-03 DIAGNOSIS — R0602 Shortness of breath: Secondary | ICD-10-CM

## 2017-05-03 LAB — RVP VIRAL PANEL PCR

## 2017-05-03 LAB — GRAM STAIN

## 2017-05-03 LAB — LEGIONELLA ANTIGEN URINE,RAN: Lab: NEGATIVE

## 2017-05-03 LAB — BNP (B-TYPE NATRIURETIC PEPTI): Lab: 114 pg/mL — ABNORMAL HIGH (ref 0–100)

## 2017-05-03 LAB — PROCALCITONIN
Lab: 0.1 ng/mL — ABNORMAL HIGH (ref <0.10)
Lab: 0.1 ng/mL — ABNORMAL HIGH (ref <0.10)

## 2017-05-03 LAB — MAGNESIUM: Lab: 1.4 mg/dL — ABNORMAL LOW (ref 1.6–2.6)

## 2017-05-03 LAB — POC GLUCOSE
Lab: 367 mg/dL — ABNORMAL HIGH (ref 70–100)
Lab: 285 mg/dL — ABNORMAL HIGH (ref 70–100)
Lab: 233 mg/dL — ABNORMAL HIGH (ref 70–100)
Lab: 382 mg/dL — ABNORMAL HIGH (ref 70–100)
Lab: 426 mg/dL — ABNORMAL HIGH (ref 70–100)

## 2017-05-03 LAB — STREPTOCOCCUS PNEUMO AG, URINE: Lab: NEGATIVE

## 2017-05-03 LAB — BASIC METABOLIC PANEL: Lab: 135 MMOL/L — ABNORMAL LOW (ref >60)

## 2017-05-03 LAB — CBC AND DIFF: Lab: 4.7 K/UL — ABNORMAL HIGH (ref 4.5–11.0)

## 2017-05-03 MED ORDER — BUMETANIDE 0.25 MG/ML IJ SOLN
2 mg | Freq: Once | INTRAVENOUS | 0 refills | Status: CP
Start: 2017-05-03 — End: ?
  Administered 2017-05-03: 19:00:00 2 mg via INTRAVENOUS

## 2017-05-03 MED ORDER — FAMOTIDINE 20 MG PO TAB
10 mg | Freq: Two times a day (BID) | ORAL | 0 refills | Status: DC
Start: 2017-05-03 — End: 2017-05-21
  Administered 2017-05-04 – 2017-05-21 (×34): 10 mg via ORAL

## 2017-05-03 MED ORDER — MAGNESIUM SULFATE IN D5W 1 GRAM/100 ML IV PGBK
1 g | INTRAVENOUS | 0 refills | Status: CP
Start: 2017-05-03 — End: ?
  Administered 2017-05-03 (×3): 1 g via INTRAVENOUS

## 2017-05-03 MED ORDER — AZITHROMYCIN 250 MG PO TAB
500 mg | Freq: Every day | ORAL | 0 refills | Status: CP
Start: 2017-05-03 — End: ?
  Administered 2017-05-03 – 2017-05-09 (×7): 500 mg via ORAL

## 2017-05-03 MED ORDER — POTASSIUM CHLORIDE 20 MEQ PO TBTQ
20 meq | Freq: Once | ORAL | 0 refills | Status: CP
Start: 2017-05-03 — End: ?
  Administered 2017-05-03: 14:00:00 20 meq via ORAL

## 2017-05-03 MED ORDER — PERFLUTREN LIPID MICROSPHERES 1.1 MG/ML IV SUSP
1-20 mL | Freq: Once | INTRAVENOUS | 0 refills | Status: CP | PRN
Start: 2017-05-03 — End: ?
  Administered 2017-05-03: 16:00:00 3 mL via INTRAVENOUS

## 2017-05-03 MED ORDER — METRONIDAZOLE 500 MG PO TAB
500 mg | Freq: Three times a day (TID) | ORAL | 0 refills | Status: AC
Start: 2017-05-03 — End: ?
  Administered 2017-05-04 – 2017-05-10 (×20): 500 mg via ORAL

## 2017-05-03 MED ORDER — INSULIN ASPART 100 UNIT/ML SC FLEXPEN
0-24 [IU] | Freq: Before meals | SUBCUTANEOUS | 0 refills | Status: DC
Start: 2017-05-03 — End: 2017-05-21
  Administered 2017-05-08: 03:00:00 20 [IU] via SUBCUTANEOUS

## 2017-05-03 MED ORDER — SENNOSIDES-DOCUSATE SODIUM 8.6-50 MG PO TAB
1 | Freq: Two times a day (BID) | ORAL | 0 refills | Status: DC
Start: 2017-05-03 — End: 2017-05-05
  Administered 2017-05-03 – 2017-05-05 (×5): 1 via ORAL

## 2017-05-03 MED ORDER — BECLOMETHASONE DIPROP (AQ) 42 MCG (0.042 %) NA SPRY
2 | Freq: Two times a day (BID) | NASAL | 0 refills | Status: DC
Start: 2017-05-03 — End: 2017-05-21
  Administered 2017-05-04: 02:00:00 2 via NASAL

## 2017-05-03 MED ORDER — METHYLPREDNISOLONE SOD SUC(PF) 125 MG/2 ML IJ SOLR
62.5 mg | INTRAVENOUS | 0 refills | Status: DC
Start: 2017-05-03 — End: 2017-05-08
  Administered 2017-05-03 – 2017-05-08 (×15): 62.5 mg via INTRAVENOUS

## 2017-05-04 ENCOUNTER — Encounter: Admit: 2017-05-04 | Discharge: 2017-05-04 | Payer: MEDICARE

## 2017-05-04 LAB — CBC AND DIFF: Lab: 7.9 K/UL — ABNORMAL LOW (ref >60)

## 2017-05-04 LAB — CULTURE-RESP,LOWER W/SENSITIVITY: Lab: LOW

## 2017-05-04 LAB — BASIC METABOLIC PANEL: Lab: 131 MMOL/L — ABNORMAL LOW (ref >60)

## 2017-05-04 LAB — POC GLUCOSE
Lab: 362 mg/dL — ABNORMAL HIGH (ref 70–100)
Lab: 296 mg/dL — ABNORMAL HIGH (ref 70–100)
Lab: 227 mg/dL — ABNORMAL HIGH (ref 70–100)
Lab: 347 mg/dL — ABNORMAL HIGH (ref 70–100)

## 2017-05-04 LAB — MAGNESIUM: Lab: 2.1 mg/dL — ABNORMAL LOW (ref 1.6–2.6)

## 2017-05-04 MED ORDER — BUMETANIDE 0.25 MG/ML IJ SOLN
2 mg | Freq: Once | INTRAVENOUS | 0 refills | Status: CP
Start: 2017-05-04 — End: ?
  Administered 2017-05-04: 21:00:00 2 mg via INTRAVENOUS

## 2017-05-04 MED ORDER — METOLAZONE 2.5 MG PO TAB
2.5 mg | Freq: Once | ORAL | 0 refills | Status: CP
Start: 2017-05-04 — End: ?
  Administered 2017-05-04: 19:00:00 2.5 mg via ORAL

## 2017-05-05 LAB — MAGNESIUM: Lab: 2 mg/dL — ABNORMAL HIGH (ref >60)

## 2017-05-05 LAB — BASIC METABOLIC PANEL: Lab: 131 MMOL/L — ABNORMAL LOW (ref 137–147)

## 2017-05-05 LAB — CBC AND DIFF: Lab: 6.9 K/UL — ABNORMAL LOW (ref >60)

## 2017-05-05 LAB — POC GLUCOSE
Lab: 446 mg/dL — ABNORMAL HIGH (ref 70–100)
Lab: 340 mg/dL — ABNORMAL HIGH (ref 70–100)
Lab: 222 mg/dL — ABNORMAL HIGH (ref 70–100)
Lab: 301 mg/dL — ABNORMAL HIGH (ref 70–100)

## 2017-05-05 LAB — HISTOPLASMA AG-URINE RANDOM

## 2017-05-05 LAB — IMMUNOGLOBULIN G (IGG): Lab: 492 mg/dL — ABNORMAL LOW (ref >60)

## 2017-05-05 MED ORDER — SENNOSIDES-DOCUSATE SODIUM 8.6-50 MG PO TAB
2 | Freq: Two times a day (BID) | ORAL | 0 refills | Status: DC
Start: 2017-05-05 — End: 2017-05-21
  Administered 2017-05-06 – 2017-05-21 (×23): 2 via ORAL

## 2017-05-05 MED ORDER — POLYETHYLENE GLYCOL 3350 17 GRAM PO PWPK
1 | Freq: Two times a day (BID) | ORAL | 0 refills | Status: DC
Start: 2017-05-05 — End: 2017-05-21
  Administered 2017-05-05 – 2017-05-10 (×10): 17 g via ORAL

## 2017-05-06 DIAGNOSIS — R0602 Shortness of breath: Secondary | ICD-10-CM

## 2017-05-06 LAB — BASIC METABOLIC PANEL
Lab: 10 mg/dL — ABNORMAL LOW (ref >60)
Lab: 131 MMOL/L — ABNORMAL LOW (ref 137–147)
Lab: 394 mg/dL — ABNORMAL HIGH (ref >60)

## 2017-05-06 LAB — POC GLUCOSE
Lab: 391 mg/dL — ABNORMAL HIGH (ref 70–100)
Lab: 279 mg/dL — ABNORMAL HIGH (ref 70–100)
Lab: 247 mg/dL — ABNORMAL HIGH (ref 70–100)
Lab: 315 mg/dL — ABNORMAL HIGH (ref 70–100)

## 2017-05-06 LAB — MAGNESIUM: Lab: 2.2 mg/dL — ABNORMAL LOW (ref >60)

## 2017-05-06 LAB — CBC AND DIFF: Lab: 7.3 K/UL — ABNORMAL LOW (ref >60)

## 2017-05-06 MED ORDER — BUMETANIDE 2 MG PO TAB
4 mg | Freq: Every day | ORAL | 0 refills | Status: DC
Start: 2017-05-06 — End: 2017-05-07
  Administered 2017-05-07: 14:00:00 4 mg via ORAL

## 2017-05-07 ENCOUNTER — Encounter: Admit: 2017-05-07 | Discharge: 2017-05-07 | Payer: MEDICARE

## 2017-05-07 LAB — POC GLUCOSE
Lab: 424 mg/dL — ABNORMAL HIGH (ref 70–100)
Lab: 410 mg/dL — ABNORMAL HIGH (ref 70–100)
Lab: 256 mg/dL — ABNORMAL HIGH (ref 70–100)
Lab: 224 mg/dL — ABNORMAL HIGH (ref 70–100)
Lab: 307 mg/dL — ABNORMAL HIGH (ref 70–100)

## 2017-05-07 LAB — MAGNESIUM: Lab: 2.3 mg/dL — ABNORMAL LOW (ref >60)

## 2017-05-07 LAB — BASIC METABOLIC PANEL
Lab: 135 MMOL/L — ABNORMAL LOW (ref 137–147)
Lab: 97 MMOL/L — ABNORMAL LOW (ref 98–110)

## 2017-05-07 LAB — CBC AND DIFF
Lab: 104 FL — ABNORMAL HIGH (ref 80–100)
Lab: 3.3 M/UL — ABNORMAL LOW (ref >60)
Lab: 8.9 K/UL — ABNORMAL LOW (ref >60)

## 2017-05-07 MED ORDER — INSULIN GLARGINE 100 UNIT/ML (3 ML) SC INJ PEN
10 [IU] | Freq: Every evening | SUBCUTANEOUS | 0 refills | Status: DC
Start: 2017-05-07 — End: 2017-05-10
  Administered 2017-05-08: 03:00:00 10 [IU] via SUBCUTANEOUS

## 2017-05-07 MED ORDER — INSULIN GLARGINE 100 UNIT/ML (3 ML) SC INJ PEN
10 [IU] | Freq: Once | SUBCUTANEOUS | 0 refills | Status: CP
Start: 2017-05-07 — End: ?
  Administered 2017-05-07: 08:00:00 10 [IU] via SUBCUTANEOUS

## 2017-05-07 MED ORDER — BUMETANIDE 2 MG PO TAB
1 mg | Freq: Every day | ORAL | 0 refills | Status: DC
Start: 2017-05-07 — End: 2017-05-07

## 2017-05-07 MED ORDER — BUMETANIDE 2 MG PO TAB
4 mg | Freq: Every day | ORAL | 0 refills | Status: DC
Start: 2017-05-07 — End: 2017-05-08

## 2017-05-08 LAB — CULTURE-BLOOD W/SENSITIVITY

## 2017-05-08 LAB — POC GLUCOSE
Lab: 413 mg/dL — ABNORMAL HIGH (ref 70–100)
Lab: 429 mg/dL — ABNORMAL HIGH (ref 70–100)
Lab: 222 mg/dL — ABNORMAL HIGH (ref >60)
Lab: 201 mg/dL — ABNORMAL HIGH (ref 70–100)
Lab: 352 mg/dL — ABNORMAL HIGH (ref 70–100)

## 2017-05-08 LAB — CBC AND DIFF: Lab: 11 K/UL — ABNORMAL HIGH (ref 4.5–11.0)

## 2017-05-08 LAB — MAGNESIUM: Lab: 2.4 mg/dL — ABNORMAL HIGH (ref 1.6–2.6)

## 2017-05-08 LAB — BASIC METABOLIC PANEL: Lab: 136 MMOL/L — ABNORMAL LOW (ref 137–147)

## 2017-05-08 MED ORDER — BARIUM SULFATE 40 % (W/V) PO SUSP
10 mL | Freq: Once | ORAL | 0 refills | Status: CP
Start: 2017-05-08 — End: ?
  Administered 2017-05-08: 20:00:00 10 mL via ORAL

## 2017-05-08 MED ORDER — IMS MIXTURE TEMPLATE
60 mg | Freq: Every day | ORAL | 0 refills | Status: DC
Start: 2017-05-08 — End: 2017-05-15
  Administered 2017-05-09 – 2017-05-14 (×12): 60 mg via ORAL

## 2017-05-08 MED ORDER — BARIUM SULFATE 40 % (W/V), 30% (W/W) PO PSTE
10 mL | Freq: Once | ORAL | 0 refills | Status: CP
Start: 2017-05-08 — End: ?
  Administered 2017-05-08: 20:00:00 10 mL via ORAL

## 2017-05-08 MED ORDER — NPH INSULIN HUMAN RECOMB 100 UNIT/ML (3 ML) SC PEN
10 [IU] | Freq: Every day | SUBCUTANEOUS | 0 refills | Status: DC
Start: 2017-05-08 — End: 2017-05-10
  Administered 2017-05-09: 14:00:00 10 [IU] via SUBCUTANEOUS

## 2017-05-08 MED ORDER — BUMETANIDE 2 MG PO TAB
2 mg | Freq: Every day | ORAL | 0 refills | Status: DC
Start: 2017-05-08 — End: 2017-05-08
  Administered 2017-05-08: 13:00:00 2 mg via ORAL

## 2017-05-08 MED ORDER — BUMETANIDE 2 MG PO TAB
2 mg | Freq: Once | ORAL | 0 refills | Status: CP
Start: 2017-05-08 — End: ?
  Administered 2017-05-08: 17:00:00 2 mg via ORAL

## 2017-05-08 MED ORDER — BARIUM SULFATE 40 % (W/V) 29% (W/W) PO SUSP
10 mL | Freq: Once | ORAL | 0 refills | Status: CP
Start: 2017-05-08 — End: ?
  Administered 2017-05-08: 20:00:00 10 mL via ORAL

## 2017-05-08 MED ORDER — BARIUM SULFATE 40 % (W/V) PO POWD
10 mL | Freq: Once | ORAL | 0 refills | Status: CP
Start: 2017-05-08 — End: ?
  Administered 2017-05-08: 20:00:00 10 mL via ORAL

## 2017-05-08 MED ORDER — BUMETANIDE 2 MG PO TAB
4 mg | Freq: Every day | ORAL | 0 refills | Status: DC
Start: 2017-05-08 — End: 2017-05-09
  Administered 2017-05-09: 13:00:00 4 mg via ORAL

## 2017-05-08 MED ORDER — NPH INSULIN HUMAN RECOMB 100 UNIT/ML (3 ML) SC PEN
10 [IU] | SUBCUTANEOUS | 0 refills | Status: DC
Start: 2017-05-08 — End: 2017-05-08

## 2017-05-08 MED ORDER — INSULIN NPH AND REGULAR HUMAN 100 UNIT/ML (70-30) SC SUSP
10 [IU] | Freq: Once | SUBCUTANEOUS | 0 refills | Status: CP
Start: 2017-05-08 — End: ?
  Administered 2017-05-08: 07:00:00 10 [IU] via SUBCUTANEOUS

## 2017-05-09 LAB — BASIC METABOLIC PANEL
Lab: 135 MMOL/L — ABNORMAL LOW (ref 137–147)
Lab: 4.6 MMOL/L — ABNORMAL LOW (ref 3.5–5.1)

## 2017-05-09 LAB — MAGNESIUM: Lab: 2.1 mg/dL — ABNORMAL LOW (ref 1.6–2.6)

## 2017-05-09 LAB — POC GLUCOSE
Lab: 205 mg/dL — ABNORMAL HIGH (ref 70–100)
Lab: 139 mg/dL — ABNORMAL HIGH (ref 70–100)
Lab: 211 mg/dL — ABNORMAL HIGH (ref 70–100)
Lab: 291 mg/dL — ABNORMAL HIGH (ref 70–100)

## 2017-05-09 LAB — CBC AND DIFF: Lab: 12 K/UL — ABNORMAL HIGH (ref 4.5–11.0)

## 2017-05-09 MED ORDER — BUMETANIDE 2 MG PO TAB
2 mg | Freq: Every day | ORAL | 0 refills | Status: DC
Start: 2017-05-09 — End: 2017-05-11
  Administered 2017-05-10: 14:00:00 2 mg via ORAL

## 2017-05-09 MED ORDER — GUAIFENESIN 600 MG PO TA12
1200 mg | Freq: Every day | ORAL | 0 refills | Status: DC
Start: 2017-05-09 — End: 2017-05-21
  Administered 2017-05-10 – 2017-05-21 (×12): 1200 mg via ORAL

## 2017-05-10 ENCOUNTER — Encounter: Admit: 2017-05-10 | Discharge: 2017-05-10 | Payer: MEDICARE

## 2017-05-10 ENCOUNTER — Inpatient Hospital Stay: Admit: 2017-05-10 | Discharge: 2017-05-10 | Payer: MEDICARE

## 2017-05-10 DIAGNOSIS — J471 Bronchiectasis with (acute) exacerbation: ICD-10-CM

## 2017-05-10 DIAGNOSIS — R05 Cough: Principal | ICD-10-CM

## 2017-05-10 LAB — MAGNESIUM: Lab: 2.4 mg/dL — ABNORMAL LOW (ref >60)

## 2017-05-10 LAB — BASIC METABOLIC PANEL: Lab: 136 MMOL/L — ABNORMAL LOW (ref 137–147)

## 2017-05-10 LAB — POC GLUCOSE
Lab: 126 mg/dL — ABNORMAL HIGH (ref 70–100)
Lab: 226 mg/dL — ABNORMAL HIGH (ref 70–100)
Lab: 317 mg/dL — ABNORMAL HIGH (ref 70–100)
Lab: 340 mg/dL — ABNORMAL HIGH (ref 70–100)
Lab: 369 mg/dL — ABNORMAL HIGH (ref 70–100)

## 2017-05-10 LAB — CBC AND DIFF: Lab: 11 K/UL — ABNORMAL HIGH (ref 4.5–11.0)

## 2017-05-10 MED ORDER — BUMETANIDE 0.25 MG/ML IJ SOLN
2 mg | Freq: Once | INTRAVENOUS | 0 refills | Status: CP
Start: 2017-05-10 — End: ?
  Administered 2017-05-10: 19:00:00 2 mg via INTRAVENOUS

## 2017-05-10 MED ORDER — LOSARTAN 25 MG PO TAB
25 mg | Freq: Every day | ORAL | 0 refills | Status: DC
Start: 2017-05-10 — End: 2017-05-17
  Administered 2017-05-11 – 2017-05-16 (×6): 25 mg via ORAL

## 2017-05-10 MED ORDER — SODIUM CHLORIDE 0.9 % IV SOLP
INTRAVENOUS | 0 refills | Status: CN
Start: 2017-05-10 — End: ?

## 2017-05-10 MED ORDER — INSULIN GLARGINE 100 UNIT/ML (3 ML) SC INJ PEN
14 [IU] | Freq: Every evening | SUBCUTANEOUS | 0 refills | Status: DC
Start: 2017-05-10 — End: 2017-05-15

## 2017-05-11 ENCOUNTER — Encounter: Admit: 2017-05-11 | Discharge: 2017-05-11 | Payer: MEDICARE

## 2017-05-11 DIAGNOSIS — I509 Heart failure, unspecified: ICD-10-CM

## 2017-05-11 DIAGNOSIS — I10 Essential (primary) hypertension: Secondary | ICD-10-CM

## 2017-05-11 DIAGNOSIS — Z8614 Personal history of Methicillin resistant Staphylococcus aureus infection: ICD-10-CM

## 2017-05-11 DIAGNOSIS — Z8679 Personal history of other diseases of the circulatory system: ICD-10-CM

## 2017-05-11 DIAGNOSIS — J45909 Unspecified asthma, uncomplicated: ICD-10-CM

## 2017-05-11 DIAGNOSIS — G4733 Obstructive sleep apnea (adult) (pediatric): ICD-10-CM

## 2017-05-11 DIAGNOSIS — E669 Obesity, unspecified: ICD-10-CM

## 2017-05-11 DIAGNOSIS — J479 Bronchiectasis, uncomplicated: ICD-10-CM

## 2017-05-11 DIAGNOSIS — J189 Pneumonia, unspecified organism: ICD-10-CM

## 2017-05-11 DIAGNOSIS — J302 Other seasonal allergic rhinitis: ICD-10-CM

## 2017-05-11 DIAGNOSIS — R0600 Dyspnea, unspecified: ICD-10-CM

## 2017-05-11 DIAGNOSIS — L03116 Cellulitis of left lower limb: ICD-10-CM

## 2017-05-11 DIAGNOSIS — G4730 Sleep apnea, unspecified: ICD-10-CM

## 2017-05-11 DIAGNOSIS — E785 Hyperlipidemia, unspecified: ICD-10-CM

## 2017-05-11 DIAGNOSIS — R05 Cough: ICD-10-CM

## 2017-05-11 DIAGNOSIS — Z95828 Presence of other vascular implants and grafts: ICD-10-CM

## 2017-05-11 DIAGNOSIS — J449 Chronic obstructive pulmonary disease, unspecified: ICD-10-CM

## 2017-05-11 DIAGNOSIS — R609 Edema, unspecified: ICD-10-CM

## 2017-05-11 DIAGNOSIS — N183 Chronic kidney disease, stage 3 (moderate): ICD-10-CM

## 2017-05-11 DIAGNOSIS — K219 Gastro-esophageal reflux disease without esophagitis: ICD-10-CM

## 2017-05-11 DIAGNOSIS — R0602 Shortness of breath: Secondary | ICD-10-CM

## 2017-05-11 DIAGNOSIS — I714 Abdominal aortic aneurysm, without rupture: ICD-10-CM

## 2017-05-11 DIAGNOSIS — E782 Mixed hyperlipidemia: ICD-10-CM

## 2017-05-11 DIAGNOSIS — Z9981 Dependence on supplemental oxygen: ICD-10-CM

## 2017-05-11 DIAGNOSIS — M954 Acquired deformity of chest and rib: ICD-10-CM

## 2017-05-11 DIAGNOSIS — I5042 Chronic combined systolic (congestive) and diastolic (congestive) heart failure: ICD-10-CM

## 2017-05-11 DIAGNOSIS — I2510 Atherosclerotic heart disease of native coronary artery without angina pectoris: Secondary | ICD-10-CM

## 2017-05-11 DIAGNOSIS — I429 Cardiomyopathy, unspecified: ICD-10-CM

## 2017-05-11 DIAGNOSIS — M961 Postlaminectomy syndrome, not elsewhere classified: ICD-10-CM

## 2017-05-11 LAB — BASIC METABOLIC PANEL: Lab: 133 MMOL/L — ABNORMAL LOW (ref >60)

## 2017-05-11 LAB — POC GLUCOSE
Lab: 240 mg/dL — ABNORMAL HIGH (ref 70–100)
Lab: 112 mg/dL — ABNORMAL HIGH (ref >60)
Lab: 141 mg/dL — ABNORMAL HIGH (ref 70–100)
Lab: 238 mg/dL — ABNORMAL HIGH (ref 70–100)
Lab: 241 mg/dL — ABNORMAL HIGH (ref 70–100)

## 2017-05-11 LAB — MAGNESIUM: Lab: 2.6 mg/dL — ABNORMAL LOW (ref 1.6–2.6)

## 2017-05-11 LAB — CBC AND DIFF: Lab: 10 K/UL — ABNORMAL HIGH (ref 4.5–11.0)

## 2017-05-11 MED ORDER — LIDOCAINE (PF) 10 MG/ML (1 %) IJ SOLN
.1-2 mL | INTRAMUSCULAR | 0 refills | Status: DC | PRN
Start: 2017-05-11 — End: 2017-05-11

## 2017-05-11 MED ORDER — BUMETANIDE 2 MG PO TAB
2 mg | Freq: Two times a day (BID) | ORAL | 0 refills | Status: DC
Start: 2017-05-11 — End: 2017-05-17
  Administered 2017-05-11 – 2017-05-17 (×12): 2 mg via ORAL

## 2017-05-11 MED ORDER — FENTANYL CITRATE (PF) 50 MCG/ML IJ SOLN
25 ug | INTRAVENOUS | 0 refills | Status: CN | PRN
Start: 2017-05-11 — End: ?

## 2017-05-11 MED ORDER — HALOPERIDOL LACTATE 5 MG/ML IJ SOLN
1 mg | Freq: Once | INTRAVENOUS | 0 refills | Status: CN | PRN
Start: 2017-05-11 — End: ?

## 2017-05-11 MED ORDER — SODIUM CHLORIDE 0.9 % IV SOLP
INTRAVENOUS | 0 refills | Status: AC
Start: 2017-05-11 — End: ?
  Administered 2017-05-11: 18:00:00 1000 mL via INTRAVENOUS

## 2017-05-11 MED ORDER — MEPERIDINE (PF) 25 MG/ML IJ SYRG
12.5 mg | INTRAVENOUS | 0 refills | Status: CN | PRN
Start: 2017-05-11 — End: ?

## 2017-05-11 MED ORDER — LACTATED RINGERS IV SOLP
1000 mL | INTRAVENOUS | 0 refills | Status: CN
Start: 2017-05-11 — End: ?

## 2017-05-12 LAB — CBC
Lab: 12 10*3/uL — ABNORMAL HIGH (ref 4.5–11.0)
Lab: 16 % — ABNORMAL HIGH (ref 11–15)
Lab: 279 10*3/uL (ref 150–400)
Lab: 3.5 M/UL — ABNORMAL LOW (ref 4.4–5.5)
Lab: 33 g/dL (ref 32.0–36.0)
Lab: 35 pg — ABNORMAL HIGH (ref 26–34)
Lab: 37 % — ABNORMAL LOW (ref 40–50)
Lab: 8.3 FL (ref 7–11)

## 2017-05-12 LAB — COMPREHENSIVE METABOLIC PANEL
Lab: 130 MMOL/L — ABNORMAL LOW (ref 137–147)
Lab: 17 U/L (ref 7–40)
Lab: 34 MMOL/L — ABNORMAL HIGH (ref 21–30)
Lab: 45 mL/min — ABNORMAL LOW (ref >60)
Lab: 54 mL/min — ABNORMAL LOW (ref >60)
Lab: 6 (ref 3–12)

## 2017-05-12 LAB — POC GLUCOSE
Lab: 299 mg/dL — ABNORMAL HIGH (ref 70–100)
Lab: 154 mg/dL — ABNORMAL HIGH (ref 70–100)
Lab: 307 mg/dL — ABNORMAL HIGH (ref 70–100)
Lab: 281 mg/dL — ABNORMAL HIGH (ref 70–100)

## 2017-05-12 MED ORDER — SODIUM CHLORIDE 3 % IN NEBU
Freq: Two times a day (BID) | RESPIRATORY_TRACT | 0 refills | Status: DC
Start: 2017-05-12 — End: 2017-05-12
  Administered 2017-05-12: 17:00:00 15 mL via RESPIRATORY_TRACT

## 2017-05-12 MED ORDER — BUMETANIDE 0.25 MG/ML IJ SOLN
2 mg | Freq: Once | INTRAVENOUS | 0 refills | Status: CP
Start: 2017-05-12 — End: ?
  Administered 2017-05-12: 19:00:00 2 mg via INTRAVENOUS

## 2017-05-12 MED ORDER — NPH INSULIN HUMAN RECOMB 100 UNIT/ML (3 ML) SC PEN
10 [IU] | Freq: Every day | SUBCUTANEOUS | 0 refills | Status: DC
Start: 2017-05-12 — End: 2017-05-13

## 2017-05-12 MED ORDER — SODIUM CHLORIDE 3 % IN NEBU
Freq: Four times a day (QID) | RESPIRATORY_TRACT | 0 refills | Status: DC
Start: 2017-05-12 — End: 2017-05-21
  Administered 2017-05-13 – 2017-05-15 (×9): 15 mL via RESPIRATORY_TRACT
  Administered 2017-05-15: 01:00:00 15.000 mL via RESPIRATORY_TRACT
  Administered 2017-05-15 – 2017-05-16 (×3): 15 mL via RESPIRATORY_TRACT
  Administered 2017-05-16: 21:00:00 15.000 mL via RESPIRATORY_TRACT
  Administered 2017-05-17 – 2017-05-20 (×13): 15 mL via RESPIRATORY_TRACT
  Administered 2017-05-20: 02:00:00 15.000 mL via RESPIRATORY_TRACT
  Administered 2017-05-20: 18:00:00 15 mL via RESPIRATORY_TRACT
  Administered 2017-05-21: 02:00:00 4 mL via RESPIRATORY_TRACT
  Administered 2017-05-21: 13:00:00 15 mL via RESPIRATORY_TRACT

## 2017-05-12 MED ADMIN — IPRATROPIUM BROMIDE 0.02 % IN SOLN [12580]: 0.5 mg | RESPIRATORY_TRACT | @ 02:00:00 | Stop: 2017-05-12 | NDC 00487980101

## 2017-05-13 LAB — CBC: Lab: 10 K/UL — ABNORMAL LOW (ref 4.5–11.0)

## 2017-05-13 LAB — POC GLUCOSE
Lab: 250 mg/dL — ABNORMAL HIGH (ref 70–100)
Lab: 116 mg/dL — ABNORMAL HIGH (ref 70–100)
Lab: 143 mg/dL — ABNORMAL HIGH (ref 70–100)
Lab: 258 mg/dL — ABNORMAL HIGH (ref 70–100)

## 2017-05-13 LAB — COMPREHENSIVE METABOLIC PANEL: Lab: 133 MMOL/L — ABNORMAL LOW (ref 137–147)

## 2017-05-13 MED ORDER — BUMETANIDE 0.25 MG/ML IJ SOLN
2 mg | Freq: Once | INTRAVENOUS | 0 refills | Status: CP
Start: 2017-05-13 — End: ?
  Administered 2017-05-13: 17:00:00 2 mg via INTRAVENOUS

## 2017-05-13 MED ORDER — NPH INSULIN HUMAN RECOMB 100 UNIT/ML (3 ML) SC PEN
12 [IU] | Freq: Every day | SUBCUTANEOUS | 0 refills | Status: DC
Start: 2017-05-13 — End: 2017-05-21

## 2017-05-14 LAB — COMPREHENSIVE METABOLIC PANEL: Lab: 133 MMOL/L — ABNORMAL LOW (ref >60)

## 2017-05-14 LAB — POC GLUCOSE
Lab: 193 mg/dL — ABNORMAL HIGH (ref 70–100)
Lab: 210 mg/dL — ABNORMAL HIGH (ref 70–100)
Lab: 174 mg/dL — ABNORMAL HIGH (ref 70–100)
Lab: 180 mg/dL — ABNORMAL HIGH (ref 70–100)
Lab: 270 mg/dL — ABNORMAL HIGH (ref 70–100)

## 2017-05-14 LAB — CBC: Lab: 9.6 K/UL — ABNORMAL HIGH (ref 4.5–11.0)

## 2017-05-14 MED ORDER — BUMETANIDE 0.25 MG/ML IJ SOLN
2 mg | Freq: Once | INTRAVENOUS | 0 refills | Status: CP
Start: 2017-05-14 — End: ?
  Administered 2017-05-14: 16:00:00 2 mg via INTRAVENOUS

## 2017-05-15 ENCOUNTER — Encounter: Admit: 2017-05-15 | Discharge: 2017-05-15 | Payer: MEDICARE

## 2017-05-15 DIAGNOSIS — J984 Other disorders of lung: ICD-10-CM

## 2017-05-15 DIAGNOSIS — J471 Bronchiectasis with (acute) exacerbation: Principal | ICD-10-CM

## 2017-05-15 DIAGNOSIS — R05 Cough: ICD-10-CM

## 2017-05-15 DIAGNOSIS — R0602 Shortness of breath: ICD-10-CM

## 2017-05-15 LAB — POC GLUCOSE
Lab: 183 mg/dL — ABNORMAL HIGH (ref 70–100)
Lab: 153 mg/dL — ABNORMAL HIGH (ref 70–100)
Lab: 167 mg/dL — ABNORMAL HIGH (ref 70–100)
Lab: 233 mg/dL — ABNORMAL HIGH (ref 70–100)

## 2017-05-15 LAB — COMPREHENSIVE METABOLIC PANEL: Lab: 133 MMOL/L — ABNORMAL LOW (ref >60)

## 2017-05-15 LAB — CBC: Lab: 10 K/UL — ABNORMAL LOW (ref 4.5–11.0)

## 2017-05-15 MED ORDER — PREDNISONE 50 MG PO TAB
50 mg | Freq: Every day | ORAL | 0 refills | Status: CP
Start: 2017-05-15 — End: ?
  Administered 2017-05-15 – 2017-05-17 (×3): 50 mg via ORAL

## 2017-05-15 MED ORDER — BUMETANIDE 0.25 MG/ML IJ SOLN
2 mg | Freq: Once | INTRAVENOUS | 0 refills | Status: CP
Start: 2017-05-15 — End: ?
  Administered 2017-05-15: 17:00:00 2 mg via INTRAVENOUS

## 2017-05-15 MED ORDER — SODIUM CHLORIDE 0.9 % IV SOLP
INTRAVENOUS | 0 refills | Status: CN
Start: 2017-05-15 — End: ?

## 2017-05-15 MED ORDER — INSULIN GLARGINE 100 UNIT/ML (3 ML) SC INJ PEN
16 [IU] | Freq: Every evening | SUBCUTANEOUS | 0 refills | Status: DC
Start: 2017-05-15 — End: 2017-05-21

## 2017-05-15 MED ORDER — AMITRIPTYLINE(#)/GABAPENTIN/EMU OIL 4/4/10%IN HRT TOPICAL CRM 50G
TOPICAL | 0 refills | Status: DC
Start: 2017-05-15 — End: 2017-05-21
  Administered 2017-05-16: 01:00:00 via TOPICAL

## 2017-05-16 ENCOUNTER — Encounter: Admit: 2017-05-16 | Discharge: 2017-05-16 | Payer: MEDICARE

## 2017-05-16 LAB — CBC
Lab: 11 K/UL — ABNORMAL HIGH (ref >60)
Lab: 32 % — ABNORMAL LOW (ref 40–50)

## 2017-05-16 LAB — MAGNESIUM: Lab: 2 mg/dL (ref 1.6–2.6)

## 2017-05-16 LAB — POC GLUCOSE
Lab: 156 mg/dL — ABNORMAL HIGH (ref 70–100)
Lab: 110 mg/dL — ABNORMAL HIGH (ref 70–100)
Lab: 151 mg/dL — ABNORMAL HIGH (ref 70–100)
Lab: 196 mg/dL — ABNORMAL HIGH (ref 70–100)

## 2017-05-16 LAB — COMPREHENSIVE METABOLIC PANEL: Lab: 134 MMOL/L — ABNORMAL LOW (ref 137–147)

## 2017-05-16 MED ORDER — SODIUM CHLORIDE 0.9 % IV SOLP
INTRAVENOUS | 0 refills | Status: DC
Start: 2017-05-16 — End: 2017-05-17

## 2017-05-16 MED ORDER — SODIUM CHLORIDE 0.9 % IV SOLP
500 mL | Freq: Once | INTRAVENOUS | 0 refills | Status: CP
Start: 2017-05-16 — End: ?
  Administered 2017-05-17: 03:00:00 500 mL via INTRAVENOUS

## 2017-05-17 ENCOUNTER — Inpatient Hospital Stay: Admit: 2017-05-17 | Discharge: 2017-05-17 | Payer: MEDICARE

## 2017-05-17 LAB — BASIC METABOLIC PANEL: Lab: 132 MMOL/L — ABNORMAL LOW (ref 137–147)

## 2017-05-17 LAB — CREATININE-URINE RANDOM: Lab: 34 mg/dL (ref 0–5)

## 2017-05-17 LAB — CBC AND DIFF: Lab: 18 K/UL — ABNORMAL HIGH (ref 4.5–11.0)

## 2017-05-17 LAB — GRAM STAIN

## 2017-05-17 LAB — URINALYSIS DIPSTICK
Lab: NEGATIVE
Lab: NEGATIVE
Lab: NEGATIVE
Lab: NEGATIVE
Lab: NEGATIVE
Lab: NEGATIVE

## 2017-05-17 LAB — URINALYSIS, MICROSCOPIC

## 2017-05-17 LAB — POC GLUCOSE
Lab: 267 mg/dL — ABNORMAL HIGH (ref 70–100)
Lab: 139 mg/dL — ABNORMAL HIGH (ref 70–100)
Lab: 188 mg/dL — ABNORMAL HIGH (ref 70–100)
Lab: 157 mg/dL — ABNORMAL HIGH (ref 70–100)

## 2017-05-17 LAB — MAGNESIUM: Lab: 2 mg/dL — ABNORMAL LOW (ref >60)

## 2017-05-17 LAB — UREA NITROGEN-URINE RANDOM: Lab: 513 mg/dL (ref 0–3)

## 2017-05-17 MED ORDER — LOSARTAN 25 MG PO TAB
25 mg | Freq: Every day | ORAL | 0 refills | Status: DC
Start: 2017-05-17 — End: 2017-05-18

## 2017-05-17 MED ORDER — BUMETANIDE 2 MG PO TAB
2 mg | Freq: Two times a day (BID) | ORAL | 0 refills | Status: DC
Start: 2017-05-17 — End: 2017-05-18
  Administered 2017-05-17 – 2017-05-18 (×2): 2 mg via ORAL

## 2017-05-17 MED ORDER — HYDRALAZINE 20 MG/ML IJ SOLN
10 mg | INTRAVENOUS | 0 refills | Status: DC | PRN
Start: 2017-05-17 — End: 2017-05-21

## 2017-05-17 MED ORDER — PREDNISONE 20 MG PO TAB
40 mg | Freq: Every day | ORAL | 0 refills | Status: CP
Start: 2017-05-17 — End: ?
  Administered 2017-05-18 – 2017-05-20 (×3): 40 mg via ORAL

## 2017-05-17 MED ORDER — BUMETANIDE 2 MG PO TAB
1 mg | Freq: Every day | ORAL | 0 refills | Status: DC
Start: 2017-05-17 — End: 2017-05-17

## 2017-05-17 MED ORDER — BUMETANIDE 2 MG PO TAB
2 mg | Freq: Every day | ORAL | 0 refills | Status: DC
Start: 2017-05-17 — End: 2017-05-17

## 2017-05-18 ENCOUNTER — Encounter: Admit: 2017-05-18 | Discharge: 2017-05-18 | Payer: MEDICARE

## 2017-05-18 DIAGNOSIS — R0602 Shortness of breath: Secondary | ICD-10-CM

## 2017-05-18 LAB — CBC AND DIFF: Lab: 11 K/UL — ABNORMAL HIGH (ref >60)

## 2017-05-18 LAB — BASIC METABOLIC PANEL: Lab: 133 MMOL/L — ABNORMAL LOW (ref 137–147)

## 2017-05-18 LAB — MAGNESIUM: Lab: 2.2 mg/dL — ABNORMAL HIGH (ref 1.6–2.6)

## 2017-05-18 LAB — POC GLUCOSE
Lab: 224 mg/dL — ABNORMAL HIGH (ref 70–100)
Lab: 136 mg/dL — ABNORMAL HIGH (ref 70–100)
Lab: 131 mg/dL — ABNORMAL HIGH (ref 70–100)
Lab: 219 mg/dL — ABNORMAL HIGH (ref 70–100)

## 2017-05-18 MED ORDER — BUMETANIDE 2 MG PO TAB
2 mg | Freq: Two times a day (BID) | ORAL | 0 refills | Status: DC
Start: 2017-05-18 — End: 2017-05-21
  Administered 2017-05-18 – 2017-05-20 (×5): 2 mg via ORAL

## 2017-05-18 MED ORDER — LOSARTAN 25 MG PO TAB
12.5 mg | Freq: Every day | ORAL | 0 refills | Status: DC
Start: 2017-05-18 — End: 2017-05-21
  Administered 2017-05-18 – 2017-05-21 (×4): 12.5 mg via ORAL

## 2017-05-18 MED ORDER — BUMETANIDE 0.25 MG/ML IJ SOLN
2 mg | Freq: Once | INTRAVENOUS | 0 refills | Status: CP
Start: 2017-05-18 — End: ?
  Administered 2017-05-18: 15:00:00 2 mg via INTRAVENOUS

## 2017-05-19 LAB — CULTURE-RESP,LOWER W/SENSITIVITY: Lab: LOW /HPF — AB

## 2017-05-19 LAB — CBC AND DIFF: Lab: 10 10*3/uL — ABNORMAL LOW (ref 4.5–11.0)

## 2017-05-19 LAB — PROTIME INR (PT): Lab: 0.9 MMOL/L — ABNORMAL HIGH (ref >60)

## 2017-05-19 LAB — POC GLUCOSE
Lab: 135 mg/dL — ABNORMAL HIGH (ref 70–100)
Lab: 112 mg/dL — ABNORMAL HIGH (ref 70–100)
Lab: 195 mg/dL — ABNORMAL HIGH (ref 70–100)
Lab: 328 mg/dL — ABNORMAL HIGH (ref 70–100)

## 2017-05-19 LAB — BASIC METABOLIC PANEL: Lab: 133 MMOL/L — ABNORMAL LOW (ref 137–147)

## 2017-05-19 LAB — MAGNESIUM: Lab: 2.2 mg/dL — ABNORMAL LOW (ref >60)

## 2017-05-19 MED ORDER — IMS MIXTURE TEMPLATE
30 mg | Freq: Every day | ORAL | 0 refills | Status: DC
Start: 2017-05-19 — End: 2017-05-21
  Administered 2017-05-21 (×2): 30 mg via ORAL

## 2017-05-19 MED ORDER — PREDNISONE 10 MG PO TAB
10 mg | Freq: Every day | ORAL | 0 refills | Status: DC
Start: 2017-05-19 — End: 2017-05-21

## 2017-05-19 MED ORDER — PREDNISONE 20 MG PO TAB
20 mg | Freq: Every day | ORAL | 0 refills | Status: DC
Start: 2017-05-19 — End: 2017-05-21

## 2017-05-20 LAB — CBC AND DIFF: Lab: 8.5 K/UL — ABNORMAL LOW (ref 4.5–11.0)

## 2017-05-20 LAB — MAGNESIUM: Lab: 2.2 mg/dL — ABNORMAL LOW (ref 1.6–2.6)

## 2017-05-20 LAB — POC GLUCOSE
Lab: 156 mg/dL — ABNORMAL HIGH (ref 70–100)
Lab: 130 mg/dL — ABNORMAL HIGH (ref 70–100)
Lab: 144 mg/dL — ABNORMAL HIGH (ref 70–100)
Lab: 358 mg/dL — ABNORMAL HIGH (ref 70–100)
Lab: 401 mg/dL — ABNORMAL HIGH (ref 70–100)
Lab: 333 mg/dL — ABNORMAL HIGH (ref 70–100)

## 2017-05-20 LAB — BASIC METABOLIC PANEL: Lab: 134 MMOL/L — ABNORMAL LOW (ref 137–147)

## 2017-05-20 MED ORDER — SODIUM CHLORIDE 0.9 % IV SOLP
INTRAVENOUS | 0 refills | Status: DC
Start: 2017-05-20 — End: 2017-05-21

## 2017-05-21 ENCOUNTER — Inpatient Hospital Stay: Admit: 2017-05-08 | Discharge: 2017-05-08 | Payer: MEDICARE

## 2017-05-21 ENCOUNTER — Inpatient Hospital Stay: Admit: 2017-05-02 | Discharge: 2017-05-03 | Payer: MEDICARE

## 2017-05-21 ENCOUNTER — Inpatient Hospital Stay: Admit: 2017-05-15 | Discharge: 2017-05-15 | Payer: MEDICARE

## 2017-05-21 ENCOUNTER — Inpatient Hospital Stay: Admit: 2017-05-02 | Discharge: 2017-05-21 | Disposition: A | Discharge status: Home / Self Care | Payer: MEDICARE

## 2017-05-21 ENCOUNTER — Inpatient Hospital Stay: Admit: 2017-05-06 | Discharge: 2017-05-06 | Payer: MEDICARE

## 2017-05-21 ENCOUNTER — Encounter: Admit: 2017-05-21 | Discharge: 2017-05-21 | Payer: MEDICARE

## 2017-05-21 ENCOUNTER — Inpatient Hospital Stay: Admit: 2017-05-02 | Discharge: 2017-05-02 | Payer: MEDICARE

## 2017-05-21 DIAGNOSIS — I959 Hypotension, unspecified: ICD-10-CM

## 2017-05-21 DIAGNOSIS — F329 Major depressive disorder, single episode, unspecified: ICD-10-CM

## 2017-05-21 DIAGNOSIS — Z955 Presence of coronary angioplasty implant and graft: ICD-10-CM

## 2017-05-21 DIAGNOSIS — Z95810 Presence of automatic (implantable) cardiac defibrillator: ICD-10-CM

## 2017-05-21 DIAGNOSIS — G4733 Obstructive sleep apnea (adult) (pediatric): ICD-10-CM

## 2017-05-21 DIAGNOSIS — E782 Mixed hyperlipidemia: ICD-10-CM

## 2017-05-21 DIAGNOSIS — T380X5A Adverse effect of glucocorticoids and synthetic analogues, initial encounter: ICD-10-CM

## 2017-05-21 DIAGNOSIS — N400 Enlarged prostate without lower urinary tract symptoms: ICD-10-CM

## 2017-05-21 DIAGNOSIS — J9621 Acute and chronic respiratory failure with hypoxia: ICD-10-CM

## 2017-05-21 DIAGNOSIS — I714 Abdominal aortic aneurysm, without rupture: ICD-10-CM

## 2017-05-21 DIAGNOSIS — K219 Gastro-esophageal reflux disease without esophagitis: ICD-10-CM

## 2017-05-21 DIAGNOSIS — I130 Hypertensive heart and chronic kidney disease with heart failure and stage 1 through stage 4 chronic kidney disease, or unspecified chronic kidney disease: Principal | ICD-10-CM

## 2017-05-21 DIAGNOSIS — R1312 Dysphagia, oropharyngeal phase: ICD-10-CM

## 2017-05-21 DIAGNOSIS — Z8614 Personal history of Methicillin resistant Staphylococcus aureus infection: ICD-10-CM

## 2017-05-21 DIAGNOSIS — J449 Chronic obstructive pulmonary disease, unspecified: ICD-10-CM

## 2017-05-21 DIAGNOSIS — Z9981 Dependence on supplemental oxygen: ICD-10-CM

## 2017-05-21 DIAGNOSIS — I5043 Acute on chronic combined systolic (congestive) and diastolic (congestive) heart failure: ICD-10-CM

## 2017-05-21 DIAGNOSIS — Z6841 Body Mass Index (BMI) 40.0 and over, adult: ICD-10-CM

## 2017-05-21 DIAGNOSIS — G8929 Other chronic pain: ICD-10-CM

## 2017-05-21 DIAGNOSIS — I255 Ischemic cardiomyopathy: ICD-10-CM

## 2017-05-21 DIAGNOSIS — M109 Gout, unspecified: ICD-10-CM

## 2017-05-21 DIAGNOSIS — I2510 Atherosclerotic heart disease of native coronary artery without angina pectoris: ICD-10-CM

## 2017-05-21 DIAGNOSIS — E1122 Type 2 diabetes mellitus with diabetic chronic kidney disease: ICD-10-CM

## 2017-05-21 DIAGNOSIS — Z87891 Personal history of nicotine dependence: ICD-10-CM

## 2017-05-21 DIAGNOSIS — N179 Acute kidney failure, unspecified: ICD-10-CM

## 2017-05-21 DIAGNOSIS — N183 Chronic kidney disease, stage 3 (moderate): ICD-10-CM

## 2017-05-21 DIAGNOSIS — J9811 Atelectasis: ICD-10-CM

## 2017-05-21 DIAGNOSIS — J45901 Unspecified asthma with (acute) exacerbation: ICD-10-CM

## 2017-05-21 LAB — BASIC METABOLIC PANEL: Lab: 135 MMOL/L — ABNORMAL LOW (ref 137–147)

## 2017-05-21 LAB — MAGNESIUM: Lab: 2.2 mg/dL — ABNORMAL LOW (ref 1.6–2.6)

## 2017-05-21 LAB — CBC AND DIFF: Lab: 8.6 K/UL — ABNORMAL LOW (ref 4.5–11.0)

## 2017-05-21 LAB — POC GLUCOSE
Lab: 109 mg/dL — ABNORMAL HIGH (ref 70–100)
Lab: 116 mg/dL — ABNORMAL HIGH (ref 70–100)
Lab: 277 mg/dL — ABNORMAL HIGH (ref 70–100)

## 2017-05-21 MED ORDER — INSULIN ASPART 100 UNIT/ML SC FLEXPEN
0-12 [IU] | Freq: Before meals | SUBCUTANEOUS | 0 refills | Status: DC
Start: 2017-05-21 — End: 2017-05-21

## 2017-05-21 MED ORDER — BUMETANIDE 2 MG PO TAB
2 mg | ORAL_TABLET | Freq: Two times a day (BID) | ORAL | 0 refills | Status: AC
Start: 2017-05-21 — End: 2017-05-31

## 2017-05-21 MED ORDER — INSULIN ASPART 100 UNIT/ML SC FLEXPEN
0 refills | Status: SS
Start: 2017-05-21 — End: 2017-06-29

## 2017-05-21 MED ORDER — PREDNISONE 10 MG PO TAB
ORAL_TABLET | Freq: Every day | 0 refills | Status: SS
Start: 2017-05-21 — End: 2017-06-29

## 2017-05-21 MED ORDER — LOSARTAN 25 MG PO TAB
12.5 mg | ORAL_TABLET | Freq: Every day | ORAL | 3 refills | 30.00000 days | Status: AC
Start: 2017-05-21 — End: 2017-05-21
  Filled 2017-05-21: qty 90, 90d supply

## 2017-05-21 MED ORDER — POLYETHYLENE GLYCOL 3350 17 GRAM PO PWPK
17 g | Freq: Two times a day (BID) | ORAL | 1 refills | 18.00000 days | Status: AC
Start: 2017-05-21 — End: 2017-05-21

## 2017-05-21 MED ORDER — LOSARTAN 25 MG PO TAB
12.5 mg | ORAL_TABLET | Freq: Every day | ORAL | 3 refills | 30.00000 days | Status: AC
Start: 2017-05-21 — End: 2017-06-04

## 2017-05-21 MED ORDER — POLYETHYLENE GLYCOL 3350 17 GRAM PO PWPK
17 g | Freq: Two times a day (BID) | ORAL | 1 refills | Status: SS
Start: 2017-05-21 — End: 2018-03-04

## 2017-05-21 MED ORDER — BUMETANIDE 2 MG PO TAB
2 mg | ORAL_TABLET | Freq: Two times a day (BID) | ORAL | 0 refills | Status: AC
Start: 2017-05-21 — End: 2017-05-21

## 2017-05-21 MED ORDER — PREDNISONE 10 MG PO TAB
ORAL_TABLET | Freq: Every day | 0 refills | Status: AC
Start: 2017-05-21 — End: 2017-05-21
  Filled 2017-05-21: qty 15, 7d supply

## 2017-05-21 MED ORDER — NPH INSULIN HUMAN RECOMB 100 UNIT/ML (3 ML) SC PEN
18 [IU] | Freq: Every day | SUBCUTANEOUS | 0 refills | Status: SS
Start: 2017-05-21 — End: 2017-06-29

## 2017-05-22 ENCOUNTER — Encounter: Admit: 2017-05-22 | Discharge: 2017-05-22 | Payer: MEDICARE

## 2017-05-22 DIAGNOSIS — Z95810 Presence of automatic (implantable) cardiac defibrillator: Principal | ICD-10-CM

## 2017-05-22 MED ORDER — NYSTATIN 100,000 UNIT/ML PO SUSP
500000 [IU] | Freq: Four times a day (QID) | ORAL | 0 refills | 15.00000 days | Status: AC
Start: 2017-05-22 — End: 2017-05-23

## 2017-05-23 ENCOUNTER — Encounter: Admit: 2017-05-23 | Discharge: 2017-05-23 | Payer: MEDICARE

## 2017-05-23 MED ORDER — CLOTRIMAZOLE 10 MG MM TROC
10 mg | ORAL_TABLET | Freq: Every day | ORAL | 1 refills | Status: SS
Start: 2017-05-23 — End: 2017-06-29

## 2017-05-24 ENCOUNTER — Encounter: Admit: 2017-05-24 | Discharge: 2017-05-24 | Payer: MEDICARE

## 2017-05-24 DIAGNOSIS — R05 Cough: ICD-10-CM

## 2017-05-24 DIAGNOSIS — J45909 Unspecified asthma, uncomplicated: ICD-10-CM

## 2017-05-24 DIAGNOSIS — Z95828 Presence of other vascular implants and grafts: ICD-10-CM

## 2017-05-24 DIAGNOSIS — Z9981 Dependence on supplemental oxygen: ICD-10-CM

## 2017-05-24 DIAGNOSIS — I429 Cardiomyopathy, unspecified: ICD-10-CM

## 2017-05-24 DIAGNOSIS — E669 Obesity, unspecified: ICD-10-CM

## 2017-05-24 DIAGNOSIS — J449 Chronic obstructive pulmonary disease, unspecified: ICD-10-CM

## 2017-05-24 DIAGNOSIS — G4733 Obstructive sleep apnea (adult) (pediatric): ICD-10-CM

## 2017-05-24 DIAGNOSIS — E782 Mixed hyperlipidemia: ICD-10-CM

## 2017-05-24 DIAGNOSIS — K219 Gastro-esophageal reflux disease without esophagitis: ICD-10-CM

## 2017-05-24 DIAGNOSIS — I509 Heart failure, unspecified: ICD-10-CM

## 2017-05-24 DIAGNOSIS — M954 Acquired deformity of chest and rib: ICD-10-CM

## 2017-05-24 DIAGNOSIS — M961 Postlaminectomy syndrome, not elsewhere classified: ICD-10-CM

## 2017-05-24 DIAGNOSIS — I5042 Chronic combined systolic (congestive) and diastolic (congestive) heart failure: ICD-10-CM

## 2017-05-24 DIAGNOSIS — I10 Essential (primary) hypertension: Principal | ICD-10-CM

## 2017-05-24 DIAGNOSIS — Z8679 Personal history of other diseases of the circulatory system: ICD-10-CM

## 2017-05-24 DIAGNOSIS — Z8614 Personal history of Methicillin resistant Staphylococcus aureus infection: ICD-10-CM

## 2017-05-24 DIAGNOSIS — N183 Chronic kidney disease, stage 3 (moderate): ICD-10-CM

## 2017-05-24 DIAGNOSIS — R609 Edema, unspecified: ICD-10-CM

## 2017-05-24 DIAGNOSIS — J189 Pneumonia, unspecified organism: ICD-10-CM

## 2017-05-24 DIAGNOSIS — L03116 Cellulitis of left lower limb: ICD-10-CM

## 2017-05-24 DIAGNOSIS — J479 Bronchiectasis, uncomplicated: ICD-10-CM

## 2017-05-24 DIAGNOSIS — I714 Abdominal aortic aneurysm, without rupture: ICD-10-CM

## 2017-05-24 DIAGNOSIS — G4730 Sleep apnea, unspecified: ICD-10-CM

## 2017-05-24 DIAGNOSIS — J302 Other seasonal allergic rhinitis: ICD-10-CM

## 2017-05-24 DIAGNOSIS — E785 Hyperlipidemia, unspecified: ICD-10-CM

## 2017-05-24 DIAGNOSIS — R0600 Dyspnea, unspecified: ICD-10-CM

## 2017-05-24 DIAGNOSIS — I2510 Atherosclerotic heart disease of native coronary artery without angina pectoris: Secondary | ICD-10-CM

## 2017-05-25 ENCOUNTER — Ambulatory Visit: Admit: 2017-05-25 | Discharge: 2017-05-25 | Payer: MEDICARE

## 2017-05-25 ENCOUNTER — Encounter: Admit: 2017-05-25 | Discharge: 2017-05-25 | Payer: MEDICARE

## 2017-05-25 DIAGNOSIS — Z09 Encounter for follow-up examination after completed treatment for conditions other than malignant neoplasm: Principal | ICD-10-CM

## 2017-05-25 DIAGNOSIS — I2510 Atherosclerotic heart disease of native coronary artery without angina pectoris: Secondary | ICD-10-CM

## 2017-05-25 DIAGNOSIS — E669 Obesity, unspecified: ICD-10-CM

## 2017-05-25 DIAGNOSIS — J302 Other seasonal allergic rhinitis: ICD-10-CM

## 2017-05-25 DIAGNOSIS — M954 Acquired deformity of chest and rib: ICD-10-CM

## 2017-05-25 DIAGNOSIS — Z8614 Personal history of Methicillin resistant Staphylococcus aureus infection: ICD-10-CM

## 2017-05-25 DIAGNOSIS — K219 Gastro-esophageal reflux disease without esophagitis: ICD-10-CM

## 2017-05-25 DIAGNOSIS — R05 Cough: ICD-10-CM

## 2017-05-25 DIAGNOSIS — E782 Mixed hyperlipidemia: ICD-10-CM

## 2017-05-25 DIAGNOSIS — J45909 Unspecified asthma, uncomplicated: ICD-10-CM

## 2017-05-25 DIAGNOSIS — R609 Edema, unspecified: ICD-10-CM

## 2017-05-25 DIAGNOSIS — Z9981 Dependence on supplemental oxygen: ICD-10-CM

## 2017-05-25 DIAGNOSIS — I509 Heart failure, unspecified: ICD-10-CM

## 2017-05-25 DIAGNOSIS — R0600 Dyspnea, unspecified: ICD-10-CM

## 2017-05-25 DIAGNOSIS — J479 Bronchiectasis, uncomplicated: ICD-10-CM

## 2017-05-25 DIAGNOSIS — J449 Chronic obstructive pulmonary disease, unspecified: ICD-10-CM

## 2017-05-25 DIAGNOSIS — I714 Abdominal aortic aneurysm, without rupture: ICD-10-CM

## 2017-05-25 DIAGNOSIS — I429 Cardiomyopathy, unspecified: ICD-10-CM

## 2017-05-25 DIAGNOSIS — M961 Postlaminectomy syndrome, not elsewhere classified: ICD-10-CM

## 2017-05-25 DIAGNOSIS — E785 Hyperlipidemia, unspecified: ICD-10-CM

## 2017-05-25 DIAGNOSIS — Z8679 Personal history of other diseases of the circulatory system: ICD-10-CM

## 2017-05-25 DIAGNOSIS — G4730 Sleep apnea, unspecified: ICD-10-CM

## 2017-05-25 DIAGNOSIS — Z95828 Presence of other vascular implants and grafts: ICD-10-CM

## 2017-05-25 DIAGNOSIS — G4733 Obstructive sleep apnea (adult) (pediatric): ICD-10-CM

## 2017-05-25 DIAGNOSIS — E099 Drug or chemical induced diabetes mellitus without complications: ICD-10-CM

## 2017-05-25 DIAGNOSIS — N183 Chronic kidney disease, stage 3 (moderate): ICD-10-CM

## 2017-05-25 DIAGNOSIS — J439 Emphysema, unspecified: ICD-10-CM

## 2017-05-25 DIAGNOSIS — L03116 Cellulitis of left lower limb: ICD-10-CM

## 2017-05-25 DIAGNOSIS — J189 Pneumonia, unspecified organism: ICD-10-CM

## 2017-05-25 DIAGNOSIS — I10 Essential (primary) hypertension: ICD-10-CM

## 2017-05-25 DIAGNOSIS — I5042 Chronic combined systolic (congestive) and diastolic (congestive) heart failure: ICD-10-CM

## 2017-05-25 MED ORDER — PEN NEEDLE, DIABETIC 31 GAUGE X 1/4" MISC NDLE
SUBCUTANEOUS | 3 refills | Status: SS
Start: 2017-05-25 — End: 2017-06-29

## 2017-05-25 MED ORDER — LIRAGLUTIDE 0.6 MG/0.1 ML (18 MG/3 ML) SC PNIJ
0.6 mg | INJECTION | Freq: Every day | SUBCUTANEOUS | 3 refills | Status: AC
Start: 2017-05-25 — End: 2017-10-02

## 2017-05-25 MED ORDER — NALTREXONE-BUPROPION 8-90 MG PO TBER
1 | ORAL_TABLET | Freq: Every day | ORAL | 3 refills | Status: AC
Start: 2017-05-25 — End: 2017-05-25

## 2017-05-25 MED ORDER — SEMAGLUTIDE 0.25 MG OR 0.5 MG(2 MG/1.5 ML) SC PNIJ
.5 mg | SUBCUTANEOUS | 3 refills | Status: AC
Start: 2017-05-25 — End: 2017-05-25

## 2017-05-28 ENCOUNTER — Ambulatory Visit: Admit: 2017-05-28 | Discharge: 2017-05-28 | Payer: MEDICARE

## 2017-05-28 ENCOUNTER — Encounter: Admit: 2017-05-28 | Discharge: 2017-05-28 | Payer: MEDICARE

## 2017-05-28 DIAGNOSIS — J984 Other disorders of lung: ICD-10-CM

## 2017-05-28 DIAGNOSIS — Z8614 Personal history of Methicillin resistant Staphylococcus aureus infection: ICD-10-CM

## 2017-05-28 DIAGNOSIS — I5042 Chronic combined systolic (congestive) and diastolic (congestive) heart failure: ICD-10-CM

## 2017-05-28 DIAGNOSIS — I255 Ischemic cardiomyopathy: ICD-10-CM

## 2017-05-28 DIAGNOSIS — J479 Bronchiectasis, uncomplicated: ICD-10-CM

## 2017-05-28 DIAGNOSIS — I2510 Atherosclerotic heart disease of native coronary artery without angina pectoris: Secondary | ICD-10-CM

## 2017-05-28 DIAGNOSIS — Z959 Presence of cardiac and vascular implant and graft, unspecified: ICD-10-CM

## 2017-05-28 DIAGNOSIS — R609 Edema, unspecified: ICD-10-CM

## 2017-05-28 DIAGNOSIS — N183 Chronic kidney disease, stage 3 (moderate): ICD-10-CM

## 2017-05-28 DIAGNOSIS — J189 Pneumonia, unspecified organism: ICD-10-CM

## 2017-05-28 DIAGNOSIS — I429 Cardiomyopathy, unspecified: ICD-10-CM

## 2017-05-28 DIAGNOSIS — E669 Obesity, unspecified: ICD-10-CM

## 2017-05-28 DIAGNOSIS — Z8679 Personal history of other diseases of the circulatory system: ICD-10-CM

## 2017-05-28 DIAGNOSIS — I5043 Acute on chronic combined systolic (congestive) and diastolic (congestive) heart failure: ICD-10-CM

## 2017-05-28 DIAGNOSIS — Z95810 Presence of automatic (implantable) cardiac defibrillator: Principal | ICD-10-CM

## 2017-05-28 DIAGNOSIS — E782 Mixed hyperlipidemia: ICD-10-CM

## 2017-05-28 DIAGNOSIS — J45909 Unspecified asthma, uncomplicated: ICD-10-CM

## 2017-05-28 DIAGNOSIS — Z95828 Presence of other vascular implants and grafts: ICD-10-CM

## 2017-05-28 DIAGNOSIS — G4733 Obstructive sleep apnea (adult) (pediatric): ICD-10-CM

## 2017-05-28 DIAGNOSIS — J302 Other seasonal allergic rhinitis: ICD-10-CM

## 2017-05-28 DIAGNOSIS — E785 Hyperlipidemia, unspecified: ICD-10-CM

## 2017-05-28 DIAGNOSIS — L03116 Cellulitis of left lower limb: ICD-10-CM

## 2017-05-28 DIAGNOSIS — G4730 Sleep apnea, unspecified: ICD-10-CM

## 2017-05-28 DIAGNOSIS — I509 Heart failure, unspecified: ICD-10-CM

## 2017-05-28 DIAGNOSIS — M961 Postlaminectomy syndrome, not elsewhere classified: ICD-10-CM

## 2017-05-28 DIAGNOSIS — I10 Essential (primary) hypertension: Principal | ICD-10-CM

## 2017-05-28 DIAGNOSIS — J449 Chronic obstructive pulmonary disease, unspecified: ICD-10-CM

## 2017-05-28 DIAGNOSIS — I714 Abdominal aortic aneurysm, without rupture: ICD-10-CM

## 2017-05-28 DIAGNOSIS — M954 Acquired deformity of chest and rib: ICD-10-CM

## 2017-05-28 DIAGNOSIS — Z9981 Dependence on supplemental oxygen: ICD-10-CM

## 2017-05-28 DIAGNOSIS — R0600 Dyspnea, unspecified: ICD-10-CM

## 2017-05-28 DIAGNOSIS — K219 Gastro-esophageal reflux disease without esophagitis: ICD-10-CM

## 2017-05-28 DIAGNOSIS — R05 Cough: ICD-10-CM

## 2017-05-30 ENCOUNTER — Encounter: Admit: 2017-05-30 | Discharge: 2017-05-30 | Payer: MEDICARE

## 2017-05-30 ENCOUNTER — Ambulatory Visit: Admit: 2017-05-30 | Discharge: 2017-05-30 | Payer: MEDICARE

## 2017-05-30 DIAGNOSIS — Z9981 Dependence on supplemental oxygen: ICD-10-CM

## 2017-05-30 DIAGNOSIS — N183 Chronic kidney disease, stage 3 (moderate): ICD-10-CM

## 2017-05-30 DIAGNOSIS — J189 Pneumonia, unspecified organism: ICD-10-CM

## 2017-05-30 DIAGNOSIS — K219 Gastro-esophageal reflux disease without esophagitis: ICD-10-CM

## 2017-05-30 DIAGNOSIS — J449 Chronic obstructive pulmonary disease, unspecified: ICD-10-CM

## 2017-05-30 DIAGNOSIS — E782 Mixed hyperlipidemia: ICD-10-CM

## 2017-05-30 DIAGNOSIS — G4733 Obstructive sleep apnea (adult) (pediatric): ICD-10-CM

## 2017-05-30 DIAGNOSIS — Z8679 Personal history of other diseases of the circulatory system: ICD-10-CM

## 2017-05-30 DIAGNOSIS — Z95828 Presence of other vascular implants and grafts: ICD-10-CM

## 2017-05-30 DIAGNOSIS — R05 Cough: ICD-10-CM

## 2017-05-30 DIAGNOSIS — B999 Unspecified infectious disease: ICD-10-CM

## 2017-05-30 DIAGNOSIS — E785 Hyperlipidemia, unspecified: ICD-10-CM

## 2017-05-30 DIAGNOSIS — R600 Localized edema: ICD-10-CM

## 2017-05-30 DIAGNOSIS — J45909 Unspecified asthma, uncomplicated: ICD-10-CM

## 2017-05-30 DIAGNOSIS — I5042 Chronic combined systolic (congestive) and diastolic (congestive) heart failure: ICD-10-CM

## 2017-05-30 DIAGNOSIS — J310 Chronic rhinitis: ICD-10-CM

## 2017-05-30 DIAGNOSIS — J479 Bronchiectasis, uncomplicated: Principal | ICD-10-CM

## 2017-05-30 DIAGNOSIS — I714 Abdominal aortic aneurysm, without rupture: ICD-10-CM

## 2017-05-30 DIAGNOSIS — E669 Obesity, unspecified: ICD-10-CM

## 2017-05-30 DIAGNOSIS — M961 Postlaminectomy syndrome, not elsewhere classified: ICD-10-CM

## 2017-05-30 DIAGNOSIS — I10 Essential (primary) hypertension: Secondary | ICD-10-CM

## 2017-05-30 DIAGNOSIS — Z8614 Personal history of Methicillin resistant Staphylococcus aureus infection: ICD-10-CM

## 2017-05-30 DIAGNOSIS — G4730 Sleep apnea, unspecified: ICD-10-CM

## 2017-05-30 DIAGNOSIS — J4541 Moderate persistent asthma with (acute) exacerbation: ICD-10-CM

## 2017-05-30 DIAGNOSIS — I5043 Acute on chronic combined systolic (congestive) and diastolic (congestive) heart failure: Principal | ICD-10-CM

## 2017-05-30 DIAGNOSIS — D801 Nonfamilial hypogammaglobulinemia: ICD-10-CM

## 2017-05-30 DIAGNOSIS — R609 Edema, unspecified: ICD-10-CM

## 2017-05-30 DIAGNOSIS — R29898 Other symptoms and signs involving the musculoskeletal system: Principal | ICD-10-CM

## 2017-05-30 DIAGNOSIS — J302 Other seasonal allergic rhinitis: ICD-10-CM

## 2017-05-30 DIAGNOSIS — M954 Acquired deformity of chest and rib: ICD-10-CM

## 2017-05-30 DIAGNOSIS — Z95810 Presence of automatic (implantable) cardiac defibrillator: ICD-10-CM

## 2017-05-30 DIAGNOSIS — I2510 Atherosclerotic heart disease of native coronary artery without angina pectoris: Secondary | ICD-10-CM

## 2017-05-30 DIAGNOSIS — R0600 Dyspnea, unspecified: ICD-10-CM

## 2017-05-30 DIAGNOSIS — I509 Heart failure, unspecified: ICD-10-CM

## 2017-05-30 DIAGNOSIS — Z959 Presence of cardiac and vascular implant and graft, unspecified: ICD-10-CM

## 2017-05-30 DIAGNOSIS — I255 Ischemic cardiomyopathy: ICD-10-CM

## 2017-05-30 DIAGNOSIS — L03116 Cellulitis of left lower limb: ICD-10-CM

## 2017-05-30 DIAGNOSIS — I429 Cardiomyopathy, unspecified: ICD-10-CM

## 2017-05-30 MED ORDER — POTASSIUM CHLORIDE 20 MEQ PO TBTQ
20 meq | ORAL_TABLET | Freq: Every day | ORAL | 0 refills | 30.00000 days | Status: AC | PRN
Start: 2017-05-30 — End: 2017-06-15

## 2017-05-31 ENCOUNTER — Encounter: Admit: 2017-05-31 | Discharge: 2017-05-31 | Payer: MEDICARE

## 2017-05-31 MED ORDER — BUMETANIDE 2 MG PO TAB
3 mg | ORAL_TABLET | Freq: Two times a day (BID) | ORAL | 0 refills | Status: AC
Start: 2017-05-31 — End: 2017-06-04

## 2017-06-01 ENCOUNTER — Encounter: Admit: 2017-06-01 | Discharge: 2017-06-01 | Payer: MEDICARE

## 2017-06-04 ENCOUNTER — Encounter: Admit: 2017-06-04 | Discharge: 2017-06-04 | Payer: MEDICARE

## 2017-06-04 MED ORDER — LOSARTAN 25 MG PO TAB
12.5 mg | ORAL_TABLET | Freq: Every day | ORAL | 3 refills | Status: SS
Start: 2017-06-04 — End: 2017-06-29

## 2017-06-04 MED ORDER — BUMETANIDE 2 MG PO TAB
6 mg | ORAL_TABLET | Freq: Two times a day (BID) | ORAL | 0 refills | Status: AC
Start: 2017-06-04 — End: 2017-06-05

## 2017-06-05 ENCOUNTER — Encounter: Admit: 2017-06-05 | Discharge: 2017-06-05 | Payer: MEDICARE

## 2017-06-05 MED ORDER — BUMETANIDE 2 MG PO TAB
6 mg | ORAL_TABLET | Freq: Two times a day (BID) | ORAL | 0 refills | Status: AC
Start: 2017-06-05 — End: 2017-06-06

## 2017-06-06 MED ORDER — BUMETANIDE 2 MG PO TAB
3 mg | ORAL_TABLET | Freq: Two times a day (BID) | ORAL | 0 refills | Status: SS
Start: 2017-06-06 — End: 2017-07-09

## 2017-06-13 ENCOUNTER — Encounter: Admit: 2017-06-13 | Discharge: 2017-06-13 | Payer: MEDICARE

## 2017-06-13 DIAGNOSIS — I5043 Acute on chronic combined systolic (congestive) and diastolic (congestive) heart failure: Principal | ICD-10-CM

## 2017-06-13 LAB — BASIC METABOLIC PANEL
Lab: 138
Lab: 28
Lab: 4.6

## 2017-06-14 ENCOUNTER — Encounter: Admit: 2017-06-14 | Discharge: 2017-06-14 | Payer: MEDICARE

## 2017-06-14 DIAGNOSIS — I5042 Chronic combined systolic (congestive) and diastolic (congestive) heart failure: Principal | ICD-10-CM

## 2017-06-14 DIAGNOSIS — I5043 Acute on chronic combined systolic (congestive) and diastolic (congestive) heart failure: Principal | ICD-10-CM

## 2017-06-15 ENCOUNTER — Encounter: Admit: 2017-06-15 | Discharge: 2017-06-15 | Payer: MEDICARE

## 2017-06-15 ENCOUNTER — Ambulatory Visit: Admit: 2017-06-15 | Discharge: 2017-06-15 | Payer: MEDICARE

## 2017-06-15 DIAGNOSIS — N183 Chronic kidney disease, stage 3 (moderate): ICD-10-CM

## 2017-06-15 DIAGNOSIS — I255 Ischemic cardiomyopathy: ICD-10-CM

## 2017-06-15 DIAGNOSIS — I5042 Chronic combined systolic (congestive) and diastolic (congestive) heart failure: ICD-10-CM

## 2017-06-15 DIAGNOSIS — Z8679 Personal history of other diseases of the circulatory system: ICD-10-CM

## 2017-06-15 DIAGNOSIS — M954 Acquired deformity of chest and rib: ICD-10-CM

## 2017-06-15 DIAGNOSIS — J302 Other seasonal allergic rhinitis: ICD-10-CM

## 2017-06-15 DIAGNOSIS — K219 Gastro-esophageal reflux disease without esophagitis: ICD-10-CM

## 2017-06-15 DIAGNOSIS — G4733 Obstructive sleep apnea (adult) (pediatric): ICD-10-CM

## 2017-06-15 DIAGNOSIS — Z8614 Personal history of Methicillin resistant Staphylococcus aureus infection: ICD-10-CM

## 2017-06-15 DIAGNOSIS — R0600 Dyspnea, unspecified: ICD-10-CM

## 2017-06-15 DIAGNOSIS — I5043 Acute on chronic combined systolic (congestive) and diastolic (congestive) heart failure: Principal | ICD-10-CM

## 2017-06-15 DIAGNOSIS — I10 Essential (primary) hypertension: Secondary | ICD-10-CM

## 2017-06-15 DIAGNOSIS — I714 Abdominal aortic aneurysm, without rupture: ICD-10-CM

## 2017-06-15 DIAGNOSIS — J45909 Unspecified asthma, uncomplicated: ICD-10-CM

## 2017-06-15 DIAGNOSIS — E785 Hyperlipidemia, unspecified: ICD-10-CM

## 2017-06-15 DIAGNOSIS — G4730 Sleep apnea, unspecified: ICD-10-CM

## 2017-06-15 DIAGNOSIS — M961 Postlaminectomy syndrome, not elsewhere classified: ICD-10-CM

## 2017-06-15 DIAGNOSIS — I429 Cardiomyopathy, unspecified: ICD-10-CM

## 2017-06-15 DIAGNOSIS — R05 Cough: ICD-10-CM

## 2017-06-15 DIAGNOSIS — R609 Edema, unspecified: ICD-10-CM

## 2017-06-15 DIAGNOSIS — J449 Chronic obstructive pulmonary disease, unspecified: ICD-10-CM

## 2017-06-15 DIAGNOSIS — E669 Obesity, unspecified: ICD-10-CM

## 2017-06-15 DIAGNOSIS — I2510 Atherosclerotic heart disease of native coronary artery without angina pectoris: Secondary | ICD-10-CM

## 2017-06-15 DIAGNOSIS — L03116 Cellulitis of left lower limb: ICD-10-CM

## 2017-06-15 DIAGNOSIS — Z95828 Presence of other vascular implants and grafts: ICD-10-CM

## 2017-06-15 DIAGNOSIS — J479 Bronchiectasis, uncomplicated: ICD-10-CM

## 2017-06-15 DIAGNOSIS — J189 Pneumonia, unspecified organism: ICD-10-CM

## 2017-06-15 DIAGNOSIS — I509 Heart failure, unspecified: ICD-10-CM

## 2017-06-15 DIAGNOSIS — Z9981 Dependence on supplemental oxygen: ICD-10-CM

## 2017-06-15 DIAGNOSIS — E782 Mixed hyperlipidemia: ICD-10-CM

## 2017-06-15 LAB — BASIC METABOLIC PANEL
Lab: 1.7 mg/dL — ABNORMAL HIGH (ref 0.4–1.24)
Lab: 100 MMOL/L (ref 98–110)
Lab: 136 MMOL/L — ABNORMAL LOW (ref 137–147)
Lab: 195 mg/dL — ABNORMAL HIGH (ref 70–100)
Lab: 27 MMOL/L (ref 21–30)
Lab: 4.5 MMOL/L (ref 3.5–5.1)
Lab: 47 mg/dL — ABNORMAL HIGH (ref 7–25)
Lab: 9 (calc) (ref 3–12)
Lab: 9.7 mg/dL (ref 8.5–10.6)

## 2017-06-15 MED ORDER — POTASSIUM CHLORIDE 20 MEQ PO TBTQ
10 meq | ORAL_TABLET | Freq: Every day | ORAL | 0 refills | 30.00000 days | Status: AC
Start: 2017-06-15 — End: 2017-11-19

## 2017-06-17 ENCOUNTER — Encounter: Admit: 2017-06-17 | Discharge: 2017-06-17 | Payer: MEDICARE

## 2017-06-17 DIAGNOSIS — E785 Hyperlipidemia, unspecified: ICD-10-CM

## 2017-06-17 DIAGNOSIS — R05 Cough: ICD-10-CM

## 2017-06-17 DIAGNOSIS — J302 Other seasonal allergic rhinitis: ICD-10-CM

## 2017-06-17 DIAGNOSIS — J189 Pneumonia, unspecified organism: ICD-10-CM

## 2017-06-17 DIAGNOSIS — J449 Chronic obstructive pulmonary disease, unspecified: ICD-10-CM

## 2017-06-17 DIAGNOSIS — N183 Chronic kidney disease, stage 3 (moderate): ICD-10-CM

## 2017-06-17 DIAGNOSIS — G4733 Obstructive sleep apnea (adult) (pediatric): ICD-10-CM

## 2017-06-17 DIAGNOSIS — I714 Abdominal aortic aneurysm, without rupture: ICD-10-CM

## 2017-06-17 DIAGNOSIS — R0600 Dyspnea, unspecified: ICD-10-CM

## 2017-06-17 DIAGNOSIS — I509 Heart failure, unspecified: ICD-10-CM

## 2017-06-17 DIAGNOSIS — Z95828 Presence of other vascular implants and grafts: ICD-10-CM

## 2017-06-17 DIAGNOSIS — R609 Edema, unspecified: ICD-10-CM

## 2017-06-17 DIAGNOSIS — I2510 Atherosclerotic heart disease of native coronary artery without angina pectoris: ICD-10-CM

## 2017-06-17 DIAGNOSIS — E782 Mixed hyperlipidemia: ICD-10-CM

## 2017-06-17 DIAGNOSIS — L03116 Cellulitis of left lower limb: ICD-10-CM

## 2017-06-17 DIAGNOSIS — G4730 Sleep apnea, unspecified: ICD-10-CM

## 2017-06-17 DIAGNOSIS — J45909 Unspecified asthma, uncomplicated: ICD-10-CM

## 2017-06-17 DIAGNOSIS — E669 Obesity, unspecified: ICD-10-CM

## 2017-06-17 DIAGNOSIS — I10 Essential (primary) hypertension: Secondary | ICD-10-CM

## 2017-06-17 DIAGNOSIS — I429 Cardiomyopathy, unspecified: ICD-10-CM

## 2017-06-17 DIAGNOSIS — Z8679 Personal history of other diseases of the circulatory system: ICD-10-CM

## 2017-06-17 DIAGNOSIS — J479 Bronchiectasis, uncomplicated: ICD-10-CM

## 2017-06-17 DIAGNOSIS — Z9981 Dependence on supplemental oxygen: ICD-10-CM

## 2017-06-17 DIAGNOSIS — M961 Postlaminectomy syndrome, not elsewhere classified: ICD-10-CM

## 2017-06-17 DIAGNOSIS — I5042 Chronic combined systolic (congestive) and diastolic (congestive) heart failure: ICD-10-CM

## 2017-06-17 DIAGNOSIS — Z8614 Personal history of Methicillin resistant Staphylococcus aureus infection: ICD-10-CM

## 2017-06-17 DIAGNOSIS — K219 Gastro-esophageal reflux disease without esophagitis: ICD-10-CM

## 2017-06-17 DIAGNOSIS — M954 Acquired deformity of chest and rib: ICD-10-CM

## 2017-06-19 ENCOUNTER — Encounter: Admit: 2017-06-19 | Discharge: 2017-06-19 | Payer: MEDICARE

## 2017-06-20 ENCOUNTER — Encounter: Admit: 2017-06-20 | Discharge: 2017-06-20 | Payer: MEDICARE

## 2017-06-20 ENCOUNTER — Ambulatory Visit: Admit: 2017-06-20 | Discharge: 2017-06-20 | Payer: MEDICARE

## 2017-06-20 DIAGNOSIS — E669 Obesity, unspecified: ICD-10-CM

## 2017-06-20 DIAGNOSIS — J449 Chronic obstructive pulmonary disease, unspecified: ICD-10-CM

## 2017-06-20 DIAGNOSIS — Z8679 Personal history of other diseases of the circulatory system: ICD-10-CM

## 2017-06-20 DIAGNOSIS — I2510 Atherosclerotic heart disease of native coronary artery without angina pectoris: Principal | ICD-10-CM

## 2017-06-20 DIAGNOSIS — L03116 Cellulitis of left lower limb: ICD-10-CM

## 2017-06-20 DIAGNOSIS — J45909 Unspecified asthma, uncomplicated: ICD-10-CM

## 2017-06-20 DIAGNOSIS — N183 Chronic kidney disease, stage 3 (moderate): ICD-10-CM

## 2017-06-20 DIAGNOSIS — Z95828 Presence of other vascular implants and grafts: ICD-10-CM

## 2017-06-20 DIAGNOSIS — G4730 Sleep apnea, unspecified: ICD-10-CM

## 2017-06-20 DIAGNOSIS — I10 Essential (primary) hypertension: Principal | ICD-10-CM

## 2017-06-20 DIAGNOSIS — R05 Cough: ICD-10-CM

## 2017-06-20 DIAGNOSIS — J189 Pneumonia, unspecified organism: ICD-10-CM

## 2017-06-20 DIAGNOSIS — I714 Abdominal aortic aneurysm, without rupture: ICD-10-CM

## 2017-06-20 DIAGNOSIS — Z9981 Dependence on supplemental oxygen: ICD-10-CM

## 2017-06-20 DIAGNOSIS — M961 Postlaminectomy syndrome, not elsewhere classified: ICD-10-CM

## 2017-06-20 DIAGNOSIS — R0600 Dyspnea, unspecified: ICD-10-CM

## 2017-06-20 DIAGNOSIS — I5042 Chronic combined systolic (congestive) and diastolic (congestive) heart failure: ICD-10-CM

## 2017-06-20 DIAGNOSIS — J302 Other seasonal allergic rhinitis: ICD-10-CM

## 2017-06-20 DIAGNOSIS — J479 Bronchiectasis, uncomplicated: ICD-10-CM

## 2017-06-20 DIAGNOSIS — M954 Acquired deformity of chest and rib: ICD-10-CM

## 2017-06-20 DIAGNOSIS — R609 Edema, unspecified: ICD-10-CM

## 2017-06-20 DIAGNOSIS — Z8614 Personal history of Methicillin resistant Staphylococcus aureus infection: ICD-10-CM

## 2017-06-20 DIAGNOSIS — E785 Hyperlipidemia, unspecified: ICD-10-CM

## 2017-06-20 DIAGNOSIS — K219 Gastro-esophageal reflux disease without esophagitis: ICD-10-CM

## 2017-06-20 DIAGNOSIS — E782 Mixed hyperlipidemia: ICD-10-CM

## 2017-06-20 DIAGNOSIS — I429 Cardiomyopathy, unspecified: ICD-10-CM

## 2017-06-20 DIAGNOSIS — G4733 Obstructive sleep apnea (adult) (pediatric): ICD-10-CM

## 2017-06-20 DIAGNOSIS — I509 Heart failure, unspecified: ICD-10-CM

## 2017-06-20 LAB — CBC
Lab: 108 FL — ABNORMAL HIGH (ref 80–100)
Lab: 11 g/dL — ABNORMAL LOW (ref 13.5–16.5)
Lab: 17 % — ABNORMAL HIGH (ref >60)
Lab: 281 K/UL — ABNORMAL LOW (ref >60)
Lab: 3.2 M/UL — ABNORMAL LOW (ref 4.4–5.5)
Lab: 32 g/dL (ref 32.0–36.0)
Lab: 34 % — ABNORMAL LOW (ref 40–50)
Lab: 35 pg — ABNORMAL HIGH (ref 26–34)
Lab: 6.2 10*3/uL (ref 4.5–11.0)
Lab: 7.4 FL (ref 7–11)

## 2017-06-20 LAB — BASIC METABOLIC PANEL
Lab: 136 MMOL/L — ABNORMAL LOW (ref 137–147)
Lab: 4.2 MMOL/L (ref 3.5–5.1)

## 2017-06-20 MED ORDER — ASPIRIN 81 MG PO CHEW
324 mg | Freq: Once | ORAL | 0 refills | Status: CN
Start: 2017-06-20 — End: ?

## 2017-06-21 ENCOUNTER — Encounter: Admit: 2017-06-21 | Discharge: 2017-06-21 | Payer: MEDICARE

## 2017-06-21 DIAGNOSIS — I5042 Chronic combined systolic (congestive) and diastolic (congestive) heart failure: ICD-10-CM

## 2017-06-21 DIAGNOSIS — I10 Essential (primary) hypertension: ICD-10-CM

## 2017-06-21 MED ORDER — IMMUN GLOB G(IGG)-PRO-IGA 0-50 4 GRAM/20 ML (20 %) SC SOLN
8 g | SUBCUTANEOUS | 6 refills | 15.00000 days | Status: AC
Start: 2017-06-21 — End: 2017-10-25

## 2017-06-22 ENCOUNTER — Encounter: Admit: 2017-06-22 | Discharge: 2017-06-22 | Payer: MEDICARE

## 2017-06-22 DIAGNOSIS — I2510 Atherosclerotic heart disease of native coronary artery without angina pectoris: Principal | ICD-10-CM

## 2017-06-22 MED ORDER — TEMAZEPAM 15 MG PO CAP
15 mg | Freq: Every evening | ORAL | 0 refills | Status: CN | PRN
Start: 2017-06-22 — End: ?

## 2017-06-22 MED ORDER — ALUMINUM-MAGNESIUM HYDROXIDE 200-200 MG/5 ML PO SUSP
30 mL | ORAL | 0 refills | Status: CN | PRN
Start: 2017-06-22 — End: ?

## 2017-06-22 MED ORDER — MAGNESIUM HYDROXIDE 2,400 MG/10 ML PO SUSP
10 mL | ORAL | 0 refills | Status: CN | PRN
Start: 2017-06-22 — End: ?

## 2017-06-22 MED ORDER — ACETAMINOPHEN 325 MG PO TAB
650 mg | ORAL | 0 refills | Status: CN | PRN
Start: 2017-06-22 — End: ?

## 2017-06-26 ENCOUNTER — Encounter: Admit: 2017-06-26 | Discharge: 2017-06-26 | Payer: MEDICARE

## 2017-06-26 ENCOUNTER — Ambulatory Visit: Admit: 2017-06-26 | Discharge: 2017-06-26 | Payer: MEDICARE

## 2017-06-26 DIAGNOSIS — I5042 Chronic combined systolic (congestive) and diastolic (congestive) heart failure: Principal | ICD-10-CM

## 2017-06-26 DIAGNOSIS — E669 Obesity, unspecified: ICD-10-CM

## 2017-06-26 DIAGNOSIS — E782 Mixed hyperlipidemia: ICD-10-CM

## 2017-06-26 DIAGNOSIS — I509 Heart failure, unspecified: ICD-10-CM

## 2017-06-26 DIAGNOSIS — J189 Pneumonia, unspecified organism: ICD-10-CM

## 2017-06-26 DIAGNOSIS — K219 Gastro-esophageal reflux disease without esophagitis: ICD-10-CM

## 2017-06-26 DIAGNOSIS — E785 Hyperlipidemia, unspecified: ICD-10-CM

## 2017-06-26 DIAGNOSIS — G4730 Sleep apnea, unspecified: ICD-10-CM

## 2017-06-26 DIAGNOSIS — J310 Chronic rhinitis: ICD-10-CM

## 2017-06-26 DIAGNOSIS — J302 Other seasonal allergic rhinitis: ICD-10-CM

## 2017-06-26 DIAGNOSIS — J479 Bronchiectasis, uncomplicated: ICD-10-CM

## 2017-06-26 DIAGNOSIS — R05 Cough: ICD-10-CM

## 2017-06-26 DIAGNOSIS — R609 Edema, unspecified: ICD-10-CM

## 2017-06-26 DIAGNOSIS — I2510 Atherosclerotic heart disease of native coronary artery without angina pectoris: Secondary | ICD-10-CM

## 2017-06-26 DIAGNOSIS — J439 Emphysema, unspecified: ICD-10-CM

## 2017-06-26 DIAGNOSIS — I10 Essential (primary) hypertension: Secondary | ICD-10-CM

## 2017-06-26 DIAGNOSIS — Z95828 Presence of other vascular implants and grafts: ICD-10-CM

## 2017-06-26 DIAGNOSIS — G4733 Obstructive sleep apnea (adult) (pediatric): ICD-10-CM

## 2017-06-26 DIAGNOSIS — Z9981 Dependence on supplemental oxygen: ICD-10-CM

## 2017-06-26 DIAGNOSIS — M961 Postlaminectomy syndrome, not elsewhere classified: ICD-10-CM

## 2017-06-26 DIAGNOSIS — J4541 Moderate persistent asthma with (acute) exacerbation: ICD-10-CM

## 2017-06-26 DIAGNOSIS — R0600 Dyspnea, unspecified: ICD-10-CM

## 2017-06-26 DIAGNOSIS — Z8614 Personal history of Methicillin resistant Staphylococcus aureus infection: ICD-10-CM

## 2017-06-26 DIAGNOSIS — J449 Chronic obstructive pulmonary disease, unspecified: ICD-10-CM

## 2017-06-26 DIAGNOSIS — I429 Cardiomyopathy, unspecified: ICD-10-CM

## 2017-06-26 DIAGNOSIS — I714 Abdominal aortic aneurysm, without rupture: ICD-10-CM

## 2017-06-26 DIAGNOSIS — I5043 Acute on chronic combined systolic (congestive) and diastolic (congestive) heart failure: Principal | ICD-10-CM

## 2017-06-26 DIAGNOSIS — N183 Chronic kidney disease, stage 3 (moderate): ICD-10-CM

## 2017-06-26 DIAGNOSIS — J45909 Unspecified asthma, uncomplicated: ICD-10-CM

## 2017-06-26 DIAGNOSIS — Z8679 Personal history of other diseases of the circulatory system: ICD-10-CM

## 2017-06-26 DIAGNOSIS — M954 Acquired deformity of chest and rib: ICD-10-CM

## 2017-06-26 DIAGNOSIS — L03116 Cellulitis of left lower limb: ICD-10-CM

## 2017-06-27 ENCOUNTER — Encounter: Admit: 2017-06-27 | Discharge: 2017-06-27 | Payer: MEDICARE

## 2017-06-27 DIAGNOSIS — Z8614 Personal history of Methicillin resistant Staphylococcus aureus infection: ICD-10-CM

## 2017-06-27 DIAGNOSIS — J449 Chronic obstructive pulmonary disease, unspecified: ICD-10-CM

## 2017-06-27 DIAGNOSIS — I2510 Atherosclerotic heart disease of native coronary artery without angina pectoris: Secondary | ICD-10-CM

## 2017-06-27 DIAGNOSIS — J302 Other seasonal allergic rhinitis: ICD-10-CM

## 2017-06-27 DIAGNOSIS — M961 Postlaminectomy syndrome, not elsewhere classified: ICD-10-CM

## 2017-06-27 DIAGNOSIS — I509 Heart failure, unspecified: ICD-10-CM

## 2017-06-27 DIAGNOSIS — K219 Gastro-esophageal reflux disease without esophagitis: ICD-10-CM

## 2017-06-27 DIAGNOSIS — Z95828 Presence of other vascular implants and grafts: ICD-10-CM

## 2017-06-27 DIAGNOSIS — G4730 Sleep apnea, unspecified: ICD-10-CM

## 2017-06-27 DIAGNOSIS — G4733 Obstructive sleep apnea (adult) (pediatric): ICD-10-CM

## 2017-06-27 DIAGNOSIS — I5042 Chronic combined systolic (congestive) and diastolic (congestive) heart failure: ICD-10-CM

## 2017-06-27 DIAGNOSIS — I10 Essential (primary) hypertension: Secondary | ICD-10-CM

## 2017-06-27 DIAGNOSIS — J479 Bronchiectasis, uncomplicated: ICD-10-CM

## 2017-06-27 DIAGNOSIS — Z9981 Dependence on supplemental oxygen: ICD-10-CM

## 2017-06-27 DIAGNOSIS — E782 Mixed hyperlipidemia: ICD-10-CM

## 2017-06-27 DIAGNOSIS — J189 Pneumonia, unspecified organism: ICD-10-CM

## 2017-06-27 DIAGNOSIS — E669 Obesity, unspecified: ICD-10-CM

## 2017-06-27 DIAGNOSIS — R0600 Dyspnea, unspecified: ICD-10-CM

## 2017-06-27 DIAGNOSIS — L03116 Cellulitis of left lower limb: ICD-10-CM

## 2017-06-27 DIAGNOSIS — I429 Cardiomyopathy, unspecified: ICD-10-CM

## 2017-06-27 DIAGNOSIS — E785 Hyperlipidemia, unspecified: ICD-10-CM

## 2017-06-27 DIAGNOSIS — N183 Chronic kidney disease, stage 3 (moderate): ICD-10-CM

## 2017-06-27 DIAGNOSIS — R05 Cough: ICD-10-CM

## 2017-06-27 DIAGNOSIS — Z8679 Personal history of other diseases of the circulatory system: ICD-10-CM

## 2017-06-27 DIAGNOSIS — M954 Acquired deformity of chest and rib: ICD-10-CM

## 2017-06-27 DIAGNOSIS — I714 Abdominal aortic aneurysm, without rupture: ICD-10-CM

## 2017-06-27 DIAGNOSIS — J45909 Unspecified asthma, uncomplicated: ICD-10-CM

## 2017-06-27 DIAGNOSIS — R609 Edema, unspecified: ICD-10-CM

## 2017-06-29 ENCOUNTER — Encounter: Admit: 2017-06-29 | Discharge: 2017-06-29 | Payer: MEDICARE

## 2017-06-29 DIAGNOSIS — I2510 Atherosclerotic heart disease of native coronary artery without angina pectoris: Principal | ICD-10-CM

## 2017-06-29 LAB — BASIC METABOLIC PANEL
Lab: 1.5 mg/dL — ABNORMAL HIGH (ref 0.4–1.24)
Lab: 10 mg/dL (ref 8.5–10.6)
Lab: 103 MMOL/L — ABNORMAL LOW (ref 98–110)
Lab: 11 pg — ABNORMAL HIGH (ref 3–12)
Lab: 118 mg/dL — ABNORMAL HIGH (ref 70–100)
Lab: 139 MMOL/L — ABNORMAL LOW (ref 137–147)
Lab: 25 MMOL/L — ABNORMAL HIGH (ref 21–30)
Lab: 32 mg/dL — ABNORMAL HIGH (ref 7–25)
Lab: 4.1 MMOL/L — ABNORMAL LOW (ref 3.5–5.1)
Lab: 44 mL/min — ABNORMAL LOW (ref >60)
Lab: 53 mL/min — ABNORMAL LOW (ref >60)

## 2017-06-29 LAB — CBC: Lab: 6.3 10*3/uL (ref 4.5–11.0)

## 2017-06-29 LAB — LIPID PROFILE
Lab: 161 mg/dL — ABNORMAL HIGH (ref <150)
Lab: 74 mg/dL (ref <200)

## 2017-06-29 LAB — POC ACTIVATED CLOTTING TIME
Lab: 360 s
Lab: 400 s

## 2017-06-29 MED ORDER — DIPHENHYDRAMINE HCL 50 MG/ML IJ SOLN
25 mg | INTRAVENOUS | 0 refills | Status: DC | PRN
Start: 2017-06-29 — End: 2017-06-30

## 2017-06-29 MED ORDER — MONTELUKAST 10 MG PO TAB
10 mg | Freq: Every evening | ORAL | 0 refills | Status: DC
Start: 2017-06-29 — End: 2017-06-30
  Administered 2017-06-30: 02:00:00 10 mg via ORAL

## 2017-06-29 MED ORDER — FERROUS SULFATE 325 MG (65 MG IRON) PO TAB
325 mg | Freq: Every day | ORAL | 0 refills | Status: DC
Start: 2017-06-29 — End: 2017-06-30
  Administered 2017-06-29: 19:00:00 325 mg via ORAL

## 2017-06-29 MED ORDER — MAGNESIUM HYDROXIDE 2,400 MG/10 ML PO SUSP
10 mL | ORAL | 0 refills | Status: DC | PRN
Start: 2017-06-29 — End: 2017-06-29

## 2017-06-29 MED ORDER — CLOPIDOGREL 75 MG PO TAB
75 mg | Freq: Every day | ORAL | 0 refills | Status: DC
Start: 2017-06-29 — End: 2017-06-30

## 2017-06-29 MED ORDER — BUDESONIDE-FORMOTEROL 160-4.5 MCG/ACTUATION IN HFAA
2 | Freq: Two times a day (BID) | RESPIRATORY_TRACT | 0 refills | Status: DC
Start: 2017-06-29 — End: 2017-06-30
  Administered 2017-06-30: 2 via RESPIRATORY_TRACT

## 2017-06-29 MED ORDER — ALBUTEROL SULFATE 90 MCG/ACTUATION IN HFAA
2 | Freq: Two times a day (BID) | RESPIRATORY_TRACT | 0 refills | Status: DC | PRN
Start: 2017-06-29 — End: 2017-06-30
  Administered 2017-06-29: 18:00:00 2 via RESPIRATORY_TRACT

## 2017-06-29 MED ORDER — DULOXETINE 60 MG PO CPDR
60 mg | Freq: Every day | ORAL | 0 refills | Status: DC
Start: 2017-06-29 — End: 2017-06-30
  Administered 2017-06-29: 19:00:00 60 mg via ORAL

## 2017-06-29 MED ORDER — CETIRIZINE 10 MG PO TAB
10 mg | Freq: Every morning | ORAL | 0 refills | Status: DC
Start: 2017-06-29 — End: 2017-06-30
  Administered 2017-06-29: 19:00:00 10 mg via ORAL

## 2017-06-29 MED ORDER — SPIRONOLACTONE 25 MG PO TAB
25 mg | Freq: Two times a day (BID) | ORAL | 0 refills | Status: DC
Start: 2017-06-29 — End: 2017-06-30

## 2017-06-29 MED ORDER — PANTOPRAZOLE 40 MG PO TBEC
40 mg | Freq: Two times a day (BID) | ORAL | 0 refills | Status: DC
Start: 2017-06-29 — End: 2017-06-30
  Administered 2017-06-29 – 2017-06-30 (×3): 40 mg via ORAL

## 2017-06-29 MED ORDER — DIPHENHYDRAMINE HCL 25 MG PO CAP
25 mg | ORAL | 0 refills | Status: DC | PRN
Start: 2017-06-29 — End: 2017-06-30

## 2017-06-29 MED ORDER — CLOTRIMAZOLE 10 MG MM TROC
10 mg | Freq: Every day | ORAL | 0 refills | Status: DC
Start: 2017-06-29 — End: 2017-06-29

## 2017-06-29 MED ORDER — ACETAMINOPHEN 325 MG PO TAB
650 mg | ORAL | 0 refills | Status: DC | PRN
Start: 2017-06-29 — End: 2017-06-30

## 2017-06-29 MED ORDER — ONDANSETRON HCL (PF) 4 MG/2 ML IJ SOLN
4 mg | INTRAVENOUS | 0 refills | Status: DC | PRN
Start: 2017-06-29 — End: 2017-06-30

## 2017-06-29 MED ORDER — TEMAZEPAM 15 MG PO CAP
15 mg | Freq: Every evening | ORAL | 0 refills | Status: DC | PRN
Start: 2017-06-29 — End: 2017-06-30
  Administered 2017-06-30: 02:00:00 15 mg via ORAL

## 2017-06-29 MED ORDER — ALLOPURINOL 300 MG PO TAB
300 mg | Freq: Every day | ORAL | 0 refills | Status: DC
Start: 2017-06-29 — End: 2017-06-30
  Administered 2017-06-29 – 2017-06-30 (×2): 300 mg via ORAL

## 2017-06-29 MED ORDER — ATORVASTATIN 40 MG PO TAB
40 mg | Freq: Every day | ORAL | 0 refills | Status: DC
Start: 2017-06-29 — End: 2017-06-30
  Administered 2017-06-29 – 2017-06-30 (×2): 40 mg via ORAL

## 2017-06-29 MED ORDER — ASPIRIN 81 MG PO TBEC
81 mg | Freq: Every day | ORAL | 0 refills | Status: DC
Start: 2017-06-29 — End: 2017-06-30
  Administered 2017-06-30: 13:00:00 81 mg via ORAL

## 2017-06-29 MED ORDER — IMS MIXTURE TEMPLATE
400 mg | Freq: Three times a day (TID) | ORAL | 0 refills | Status: DC
Start: 2017-06-29 — End: 2017-06-30
  Administered 2017-06-29 – 2017-06-30 (×4): 400 mg via ORAL

## 2017-06-29 MED ORDER — ASPIRIN 81 MG PO CHEW
324 mg | Freq: Once | ORAL | 0 refills | Status: AC
Start: 2017-06-29 — End: ?

## 2017-06-29 MED ORDER — CLOPIDOGREL 300 MG PO TAB
300 mg | Freq: Once | ORAL | 0 refills | Status: CP
Start: 2017-06-29 — End: ?

## 2017-06-29 MED ORDER — POTASSIUM CHLORIDE 10 MEQ PO TBTQ
10 meq | Freq: Every day | ORAL | 0 refills | Status: DC
Start: 2017-06-29 — End: 2017-06-30
  Administered 2017-06-30: 13:00:00 10 meq via ORAL

## 2017-06-29 MED ORDER — SENNOSIDES-DOCUSATE SODIUM 8.6-50 MG PO TAB
1 | Freq: Every day | ORAL | 0 refills | Status: DC | PRN
Start: 2017-06-29 — End: 2017-06-30

## 2017-06-29 MED ORDER — CLOPIDOGREL 75 MG PO TAB
75 mg | Freq: Every day | ORAL | 0 refills | Status: DC
Start: 2017-06-29 — End: 2017-06-29

## 2017-06-29 MED ORDER — METOPROLOL SUCCINATE 25 MG PO TB24
25 mg | Freq: Every day | ORAL | 0 refills | Status: DC
Start: 2017-06-29 — End: 2017-06-30
  Administered 2017-06-29 – 2017-06-30 (×2): 25 mg via ORAL

## 2017-06-29 MED ORDER — IMS MIXTURE TEMPLATE
3 mg | Freq: Two times a day (BID) | ORAL | 0 refills | Status: DC
Start: 2017-06-29 — End: 2017-06-30
  Administered 2017-06-30 (×2): 3 mg via ORAL

## 2017-06-29 MED ORDER — ALUMINUM-MAGNESIUM HYDROXIDE 200-200 MG/5 ML PO SUSP
30 mL | ORAL | 0 refills | Status: DC | PRN
Start: 2017-06-29 — End: 2017-06-30

## 2017-06-29 MED ORDER — SODIUM CHLORIDE 0.9 % IV SOLP
INTRAVENOUS | 0 refills | Status: DC
Start: 2017-06-29 — End: 2017-06-30
  Administered 2017-06-29: 14:00:00 1000.000 mL via INTRAVENOUS

## 2017-06-29 MED ORDER — ALBUTEROL SULFATE 2.5 MG /3 ML (0.083 %) IN NEBU
2.5 mg | RESPIRATORY_TRACT | 0 refills | Status: DC | PRN
Start: 2017-06-29 — End: 2017-06-30

## 2017-06-29 MED ORDER — NITROGLYCERIN 0.4 MG SL SUBL
.4 mg | SUBLINGUAL | 0 refills | Status: DC | PRN
Start: 2017-06-29 — End: 2017-06-30

## 2017-06-29 MED ORDER — GUAIFENESIN 600 MG PO TA12
600 mg | Freq: Every day | ORAL | 0 refills | Status: DC
Start: 2017-06-29 — End: 2017-06-30
  Administered 2017-06-29 – 2017-06-30 (×2): 600 mg via ORAL

## 2017-06-29 MED ORDER — TAMSULOSIN 0.4 MG PO CAP
0.4 mg | Freq: Every day | ORAL | 0 refills | Status: DC
Start: 2017-06-29 — End: 2017-06-30
  Administered 2017-06-29 – 2017-06-30 (×2): 0.4 mg via ORAL

## 2017-06-30 LAB — CBC: Lab: 5.5 K/UL — ABNORMAL LOW (ref >60)

## 2017-06-30 LAB — BASIC METABOLIC PANEL
Lab: 136 MMOL/L — ABNORMAL LOW (ref 137–147)
Lab: 3.7 MMOL/L — ABNORMAL LOW (ref 3.5–5.1)

## 2017-06-30 MED ORDER — CLOPIDOGREL 75 MG PO TAB
75 mg | ORAL_TABLET | Freq: Every day | ORAL | 3 refills | 90.00000 days | Status: AC
Start: 2017-06-30 — End: 2018-09-02

## 2017-07-02 ENCOUNTER — Encounter: Admit: 2017-07-02 | Discharge: 2017-07-02 | Payer: MEDICARE

## 2017-07-02 ENCOUNTER — Emergency Department: Admit: 2017-07-02 | Discharge: 2017-07-02 | Payer: MEDICARE

## 2017-07-02 DIAGNOSIS — M79641 Pain in right hand: ICD-10-CM

## 2017-07-02 DIAGNOSIS — G4733 Obstructive sleep apnea (adult) (pediatric): ICD-10-CM

## 2017-07-02 DIAGNOSIS — E782 Mixed hyperlipidemia: ICD-10-CM

## 2017-07-02 DIAGNOSIS — Z9981 Dependence on supplemental oxygen: ICD-10-CM

## 2017-07-02 DIAGNOSIS — R0600 Dyspnea, unspecified: ICD-10-CM

## 2017-07-02 DIAGNOSIS — E669 Obesity, unspecified: ICD-10-CM

## 2017-07-02 DIAGNOSIS — I714 Abdominal aortic aneurysm, without rupture: ICD-10-CM

## 2017-07-02 DIAGNOSIS — I2510 Atherosclerotic heart disease of native coronary artery without angina pectoris: Secondary | ICD-10-CM

## 2017-07-02 DIAGNOSIS — I429 Cardiomyopathy, unspecified: ICD-10-CM

## 2017-07-02 DIAGNOSIS — R05 Cough: ICD-10-CM

## 2017-07-02 DIAGNOSIS — G4730 Sleep apnea, unspecified: ICD-10-CM

## 2017-07-02 DIAGNOSIS — M7989 Other specified soft tissue disorders: ICD-10-CM

## 2017-07-02 DIAGNOSIS — R609 Edema, unspecified: ICD-10-CM

## 2017-07-02 DIAGNOSIS — E785 Hyperlipidemia, unspecified: ICD-10-CM

## 2017-07-02 DIAGNOSIS — L03116 Cellulitis of left lower limb: ICD-10-CM

## 2017-07-02 DIAGNOSIS — I5042 Chronic combined systolic (congestive) and diastolic (congestive) heart failure: ICD-10-CM

## 2017-07-02 DIAGNOSIS — Z8679 Personal history of other diseases of the circulatory system: ICD-10-CM

## 2017-07-02 DIAGNOSIS — K219 Gastro-esophageal reflux disease without esophagitis: ICD-10-CM

## 2017-07-02 DIAGNOSIS — N183 Chronic kidney disease, stage 3 (moderate): ICD-10-CM

## 2017-07-02 DIAGNOSIS — I509 Heart failure, unspecified: ICD-10-CM

## 2017-07-02 DIAGNOSIS — J302 Other seasonal allergic rhinitis: ICD-10-CM

## 2017-07-02 DIAGNOSIS — Z8614 Personal history of Methicillin resistant Staphylococcus aureus infection: ICD-10-CM

## 2017-07-02 DIAGNOSIS — J479 Bronchiectasis, uncomplicated: ICD-10-CM

## 2017-07-02 DIAGNOSIS — I10 Essential (primary) hypertension: Secondary | ICD-10-CM

## 2017-07-02 DIAGNOSIS — M961 Postlaminectomy syndrome, not elsewhere classified: ICD-10-CM

## 2017-07-02 DIAGNOSIS — Z95828 Presence of other vascular implants and grafts: ICD-10-CM

## 2017-07-02 DIAGNOSIS — J189 Pneumonia, unspecified organism: ICD-10-CM

## 2017-07-02 DIAGNOSIS — J449 Chronic obstructive pulmonary disease, unspecified: ICD-10-CM

## 2017-07-02 DIAGNOSIS — M954 Acquired deformity of chest and rib: ICD-10-CM

## 2017-07-02 DIAGNOSIS — J45909 Unspecified asthma, uncomplicated: ICD-10-CM

## 2017-07-02 MED ORDER — FENTANYL CITRATE (PF) 50 MCG/ML IJ SOLN
50 ug | INTRAVENOUS | 0 refills | Status: DC | PRN
Start: 2017-07-02 — End: 2017-07-03
  Administered 2017-07-03 (×3): 50 ug via INTRAVENOUS

## 2017-07-03 ENCOUNTER — Encounter: Admit: 2017-07-03 | Discharge: 2017-07-03 | Payer: MEDICARE

## 2017-07-03 DIAGNOSIS — M79641 Pain in right hand: ICD-10-CM

## 2017-07-03 LAB — SED RATE: Lab: 88 mm/h — ABNORMAL HIGH (ref 0–20)

## 2017-07-03 LAB — BASIC METABOLIC PANEL
Lab: 1.6 mg/dL — ABNORMAL HIGH (ref 0.4–1.24)
Lab: 100 MMOL/L (ref 98–110)
Lab: 124 mg/dL — ABNORMAL HIGH (ref 70–100)
Lab: 135 MMOL/L — ABNORMAL LOW (ref 137–147)
Lab: 23 MMOL/L (ref 21–30)
Lab: 3.5 MMOL/L (ref 3.5–5.1)
Lab: 34 mg/dL — ABNORMAL HIGH (ref 7–25)
Lab: 41 mL/min — ABNORMAL LOW (ref >60)
Lab: 49 mL/min — ABNORMAL LOW (ref >60)
Lab: 9.1 mg/dL (ref 8.5–10.6)
Lab: 12 (ref 3–12)

## 2017-07-03 LAB — COMPREHENSIVE METABOLIC PANEL
Lab: 1.5 mg/dL — ABNORMAL HIGH (ref 0.3–1.2)
Lab: 1.5 mg/dL — ABNORMAL HIGH (ref 0.4–1.24)
Lab: 102 MMOL/L — ABNORMAL LOW (ref 98–110)
Lab: 127 mg/dL — ABNORMAL HIGH (ref 70–100)
Lab: 137 MMOL/L — ABNORMAL LOW (ref 137–147)
Lab: 33 mg/dL — ABNORMAL HIGH (ref 7–25)
Lab: 4 MMOL/L — ABNORMAL LOW (ref 3.5–5.1)
Lab: 4.2 g/dL (ref 3.5–5.0)
Lab: 52 U/L — ABNORMAL LOW (ref 25–110)
Lab: 7.3 g/dL (ref 6.0–8.0)
Lab: 9.4 mg/dL — ABNORMAL HIGH (ref 8.5–10.6)

## 2017-07-03 LAB — CBC AND DIFF: Lab: 7.6 10*3/uL (ref 4.5–11.0)

## 2017-07-03 LAB — C REACTIVE PROTEIN (CRP): Lab: 9.2 mg/dL — ABNORMAL HIGH (ref <1.0)

## 2017-07-03 MED ORDER — ASPIRIN 81 MG PO TBEC
81 mg | Freq: Every day | ORAL | 0 refills | Status: DC
Start: 2017-07-03 — End: 2017-07-03

## 2017-07-03 MED ORDER — METOPROLOL SUCCINATE 50 MG PO TB24
25 mg | Freq: Every day | ORAL | 0 refills | Status: DC
Start: 2017-07-03 — End: 2017-07-03

## 2017-07-03 MED ORDER — BUDESONIDE-FORMOTEROL 160-4.5 MCG/ACTUATION IN HFAA
2 | Freq: Two times a day (BID) | RESPIRATORY_TRACT | 0 refills | Status: DC
Start: 2017-07-03 — End: 2017-07-05
  Administered 2017-07-04: 03:00:00 2 via RESPIRATORY_TRACT

## 2017-07-03 MED ORDER — ACETAMINOPHEN 325 MG PO TAB
650 mg | ORAL | 0 refills | Status: DC | PRN
Start: 2017-07-03 — End: 2017-07-03

## 2017-07-03 MED ORDER — ATORVASTATIN 40 MG PO TAB
40 mg | Freq: Every day | ORAL | 0 refills | Status: DC
Start: 2017-07-03 — End: 2017-07-03

## 2017-07-03 MED ORDER — ALBUTEROL SULFATE 90 MCG/ACTUATION IN HFAA
2 | RESPIRATORY_TRACT | 0 refills | Status: DC | PRN
Start: 2017-07-03 — End: 2017-07-03

## 2017-07-03 MED ORDER — NITROGLYCERIN 0.4 MG SL SUBL
.4 mg | SUBLINGUAL | 0 refills | Status: DC | PRN
Start: 2017-07-03 — End: 2017-07-03

## 2017-07-03 MED ORDER — SENNOSIDES-DOCUSATE SODIUM 8.6-50 MG PO TAB
1 | Freq: Two times a day (BID) | ORAL | 0 refills | Status: DC
Start: 2017-07-03 — End: 2017-07-09
  Administered 2017-07-03 – 2017-07-09 (×13): 1 via ORAL

## 2017-07-03 MED ORDER — FERROUS SULFATE 325 MG (65 MG IRON) PO TAB
325 mg | Freq: Every day | ORAL | 0 refills | Status: DC
Start: 2017-07-03 — End: 2017-07-09
  Administered 2017-07-03 – 2017-07-09 (×7): 325 mg via ORAL

## 2017-07-03 MED ORDER — PANTOPRAZOLE 40 MG PO TBEC
40 mg | Freq: Two times a day (BID) | ORAL | 0 refills | Status: DC
Start: 2017-07-03 — End: 2017-07-03

## 2017-07-03 MED ORDER — MELATONIN 3 MG PO TAB
3 mg | Freq: Every evening | ORAL | 0 refills | Status: DC | PRN
Start: 2017-07-03 — End: 2017-07-03

## 2017-07-03 MED ORDER — MONTELUKAST 10 MG PO TAB
10 mg | Freq: Every evening | ORAL | 0 refills | Status: DC
Start: 2017-07-03 — End: 2017-07-09
  Administered 2017-07-04 – 2017-07-09 (×6): 10 mg via ORAL

## 2017-07-03 MED ORDER — TRAMADOL 50 MG PO TAB
50-100 mg | ORAL | 0 refills | Status: DC | PRN
Start: 2017-07-03 — End: 2017-07-09
  Administered 2017-07-03 (×2): 50 mg via ORAL
  Administered 2017-07-04 – 2017-07-05 (×3): 100 mg via ORAL

## 2017-07-03 MED ORDER — DULOXETINE 60 MG PO CPDR
60 mg | Freq: Every day | ORAL | 0 refills | Status: DC
Start: 2017-07-03 — End: 2017-07-03

## 2017-07-03 MED ORDER — CETIRIZINE 10 MG PO TAB
10 mg | Freq: Every morning | ORAL | 0 refills | Status: DC
Start: 2017-07-03 — End: 2017-07-03

## 2017-07-03 MED ORDER — ONDANSETRON HCL (PF) 4 MG/2 ML IJ SOLN
4 mg | INTRAVENOUS | 0 refills | Status: DC | PRN
Start: 2017-07-03 — End: 2017-07-03

## 2017-07-03 MED ORDER — PANTOPRAZOLE 40 MG PO TBEC
40 mg | Freq: Two times a day (BID) | ORAL | 0 refills | Status: DC
Start: 2017-07-03 — End: 2017-07-09
  Administered 2017-07-03 – 2017-07-09 (×13): 40 mg via ORAL

## 2017-07-03 MED ORDER — IMS MIXTURE TEMPLATE
3 mg | Freq: Two times a day (BID) | ORAL | 0 refills | Status: DC
Start: 2017-07-03 — End: 2017-07-03
  Administered 2017-07-03: 14:00:00 3 mg via ORAL

## 2017-07-03 MED ORDER — ACETAMINOPHEN 325 MG PO TAB
650 mg | ORAL | 0 refills | Status: DC | PRN
Start: 2017-07-03 — End: 2017-07-03
  Administered 2017-07-03: 08:00:00 650 mg via ORAL

## 2017-07-03 MED ORDER — TAMSULOSIN 0.4 MG PO CAP
0.4 mg | Freq: Every day | ORAL | 0 refills | Status: DC
Start: 2017-07-03 — End: 2017-07-09
  Administered 2017-07-03 – 2017-07-09 (×7): 0.4 mg via ORAL

## 2017-07-03 MED ORDER — IMS MIXTURE TEMPLATE
400 mg | ORAL | 0 refills | Status: DC
Start: 2017-07-03 — End: 2017-07-09
  Administered 2017-07-03 – 2017-07-09 (×25): 400 mg via ORAL

## 2017-07-03 MED ORDER — GUAIFENESIN 600 MG PO TA12
600 mg | Freq: Every day | ORAL | 0 refills | Status: DC
Start: 2017-07-03 — End: 2017-07-03

## 2017-07-03 MED ORDER — FERROUS SULFATE 325 MG (65 MG IRON) PO TAB
325 mg | Freq: Every day | ORAL | 0 refills | Status: DC
Start: 2017-07-03 — End: 2017-07-03

## 2017-07-03 MED ORDER — MONTELUKAST 10 MG PO TAB
10 mg | Freq: Every evening | ORAL | 0 refills | Status: DC
Start: 2017-07-03 — End: 2017-07-03

## 2017-07-03 MED ORDER — DOCUSATE SODIUM 100 MG PO CAP
100 mg | Freq: Every day | ORAL | 0 refills | Status: DC | PRN
Start: 2017-07-03 — End: 2017-07-03

## 2017-07-03 MED ORDER — BUMETANIDE 0.25 MG/ML IJ SOLN
3 mg | Freq: Two times a day (BID) | INTRAVENOUS | 0 refills | Status: DC
Start: 2017-07-03 — End: 2017-07-04
  Administered 2017-07-03 – 2017-07-04 (×2): 3 mg via INTRAVENOUS

## 2017-07-03 MED ORDER — POLYETHYLENE GLYCOL 3350 17 GRAM PO PWPK
17 g | Freq: Two times a day (BID) | ORAL | 0 refills | Status: DC
Start: 2017-07-03 — End: 2017-07-09
  Administered 2017-07-03 – 2017-07-09 (×13): 17 g via ORAL

## 2017-07-03 MED ORDER — ALLOPURINOL 300 MG PO TAB
300 mg | Freq: Every day | ORAL | 0 refills | Status: DC
Start: 2017-07-03 — End: 2017-07-03

## 2017-07-03 MED ORDER — POTASSIUM CHLORIDE 10 MEQ PO TBTQ
10 meq | Freq: Every day | ORAL | 0 refills | Status: DC
Start: 2017-07-03 — End: 2017-07-03

## 2017-07-03 MED ORDER — CHOLECALCIFEROL (VITAMIN D3) 1,000 UNIT PO TAB
1000 [IU] | Freq: Every day | ORAL | 0 refills | Status: DC
Start: 2017-07-03 — End: 2017-07-03

## 2017-07-03 MED ORDER — POLYETHYLENE GLYCOL 3350 17 GRAM PO PWPK
17 g | Freq: Two times a day (BID) | ORAL | 0 refills | Status: DC
Start: 2017-07-03 — End: 2017-07-03

## 2017-07-03 MED ORDER — SENNOSIDES-DOCUSATE SODIUM 8.6-50 MG PO TAB
1 | Freq: Every day | ORAL | 0 refills | Status: DC | PRN
Start: 2017-07-03 — End: 2017-07-03

## 2017-07-03 MED ORDER — CLOPIDOGREL 75 MG PO TAB
75 mg | Freq: Every day | ORAL | 0 refills | Status: DC
Start: 2017-07-03 — End: 2017-07-09
  Administered 2017-07-03 – 2017-07-09 (×7): 75 mg via ORAL

## 2017-07-03 MED ORDER — FENTANYL CITRATE (PF) 50 MCG/ML IJ SOLN
25-50 ug | INTRAVENOUS | 0 refills | Status: DC | PRN
Start: 2017-07-03 — End: 2017-07-03
  Administered 2017-07-03: 08:00:00 50 ug via INTRAVENOUS

## 2017-07-03 MED ORDER — BUDESONIDE-FORMOTEROL 160-4.5 MCG/ACTUATION IN HFAA
2 | Freq: Two times a day (BID) | RESPIRATORY_TRACT | 0 refills | Status: DC
Start: 2017-07-03 — End: 2017-07-03

## 2017-07-03 MED ORDER — TRAMADOL 50 MG PO TAB
50 mg | ORAL | 0 refills | Status: DC | PRN
Start: 2017-07-03 — End: 2017-07-03
  Administered 2017-07-03: 11:00:00 50 mg via ORAL

## 2017-07-03 MED ORDER — TEMAZEPAM 15 MG PO CAP
15 mg | Freq: Every evening | ORAL | 0 refills | Status: DC | PRN
Start: 2017-07-03 — End: 2017-07-03

## 2017-07-03 MED ORDER — ALLOPURINOL 300 MG PO TAB
300 mg | Freq: Every day | ORAL | 0 refills | Status: DC
Start: 2017-07-03 — End: 2017-07-09
  Administered 2017-07-03 – 2017-07-09 (×7): 300 mg via ORAL

## 2017-07-03 MED ORDER — ALBUTEROL SULFATE 90 MCG/ACTUATION IN HFAA
2 | RESPIRATORY_TRACT | 0 refills | Status: DC | PRN
Start: 2017-07-03 — End: 2017-07-09

## 2017-07-03 MED ORDER — SPIRONOLACTONE 25 MG PO TAB
25 mg | Freq: Two times a day (BID) | ORAL | 0 refills | Status: DC
Start: 2017-07-03 — End: 2017-07-03

## 2017-07-03 MED ORDER — ALUMINUM-MAGNESIUM HYDROXIDE 200-200 MG/5 ML PO SUSP
30 mL | ORAL | 0 refills | Status: DC | PRN
Start: 2017-07-03 — End: 2017-07-03

## 2017-07-03 MED ORDER — ASCORBIC ACID (VITAMIN C) 500 MG PO TAB
500 mg | Freq: Every day | ORAL | 0 refills | Status: DC
Start: 2017-07-03 — End: 2017-07-03

## 2017-07-03 MED ORDER — ATORVASTATIN 40 MG PO TAB
40 mg | Freq: Every day | ORAL | 0 refills | Status: DC
Start: 2017-07-03 — End: 2017-07-09
  Administered 2017-07-03 – 2017-07-09 (×7): 40 mg via ORAL

## 2017-07-03 MED ORDER — ALBUTEROL SULFATE 2.5 MG /3 ML (0.083 %) IN NEBU
2.5 mg | RESPIRATORY_TRACT | 0 refills | Status: DC | PRN
Start: 2017-07-03 — End: 2017-07-03

## 2017-07-03 MED ORDER — ASPIRIN 81 MG PO TBEC
81 mg | Freq: Every day | ORAL | 0 refills | Status: DC
Start: 2017-07-03 — End: 2017-07-09
  Administered 2017-07-03 – 2017-07-09 (×7): 81 mg via ORAL

## 2017-07-03 MED ORDER — TAMSULOSIN 0.4 MG PO CAP
0.4 mg | Freq: Every day | ORAL | 0 refills | Status: DC
Start: 2017-07-03 — End: 2017-07-03

## 2017-07-03 MED ORDER — BUMETANIDE 1 MG PO TAB
3 mg | Freq: Two times a day (BID) | ORAL | 0 refills | Status: DC
Start: 2017-07-03 — End: 2017-07-03

## 2017-07-03 MED ORDER — SENNOSIDES-DOCUSATE SODIUM 8.6-50 MG PO TAB
1 | Freq: Two times a day (BID) | ORAL | 0 refills | Status: DC
Start: 2017-07-03 — End: 2017-07-03

## 2017-07-03 MED ORDER — SPIRONOLACTONE 25 MG PO TAB
25 mg | Freq: Two times a day (BID) | ORAL | 0 refills | Status: DC
Start: 2017-07-03 — End: 2017-07-09
  Administered 2017-07-03 – 2017-07-09 (×13): 25 mg via ORAL

## 2017-07-03 MED ORDER — MELATONIN 5 MG PO TAB
5 mg | Freq: Every evening | ORAL | 0 refills | Status: DC
Start: 2017-07-03 — End: 2017-07-09
  Administered 2017-07-04 – 2017-07-09 (×6): 5 mg via ORAL

## 2017-07-03 MED ORDER — NITROGLYCERIN 0.4 MG SL SUBL
.4 mg | SUBLINGUAL | 0 refills | Status: DC | PRN
Start: 2017-07-03 — End: 2017-07-09

## 2017-07-03 MED ORDER — OXYCODONE 5 MG PO TAB
5-10 mg | ORAL | 0 refills | Status: DC | PRN
Start: 2017-07-03 — End: 2017-07-09
  Administered 2017-07-03: 22:00:00 5 mg via ORAL
  Administered 2017-07-04: 02:00:00 10 mg via ORAL
  Administered 2017-07-04: 12:00:00 5 mg via ORAL
  Administered 2017-07-05 – 2017-07-09 (×7): 10 mg via ORAL

## 2017-07-03 MED ORDER — ALBUTEROL SULFATE 2.5 MG/0.5 ML IN NEBU
2.5 mg | RESPIRATORY_TRACT | 0 refills | Status: DC | PRN
Start: 2017-07-03 — End: 2017-07-09
  Administered 2017-07-04 – 2017-07-08 (×4): 2.5 mg via RESPIRATORY_TRACT

## 2017-07-03 MED ORDER — IPRATROPIUM BROMIDE 0.02 % IN SOLN
0.5 mg | RESPIRATORY_TRACT | 0 refills | Status: DC | PRN
Start: 2017-07-03 — End: 2017-07-09
  Administered 2017-07-04: 12:00:00 0.5 mg via RESPIRATORY_TRACT

## 2017-07-03 MED ORDER — DULOXETINE 60 MG PO CPDR
60 mg | Freq: Every day | ORAL | 0 refills | Status: DC
Start: 2017-07-03 — End: 2017-07-09
  Administered 2017-07-03 – 2017-07-09 (×7): 60 mg via ORAL

## 2017-07-03 MED ORDER — FENTANYL CITRATE (PF) 50 MCG/ML IJ SOLN
25-50 ug | INTRAVENOUS | 0 refills | Status: DC | PRN
Start: 2017-07-03 — End: 2017-07-03
  Administered 2017-07-03: 10:00:00 50 ug via INTRAVENOUS

## 2017-07-03 MED ORDER — BENZONATATE 100 MG PO CAP
200 mg | ORAL | 0 refills | Status: DC | PRN
Start: 2017-07-03 — End: 2017-07-03

## 2017-07-03 MED ORDER — HEPARIN, PORCINE (PF) 5,000 UNIT/0.5 ML IJ SYRG
5000 [IU] | SUBCUTANEOUS | 0 refills | Status: DC
Start: 2017-07-03 — End: 2017-07-03

## 2017-07-03 MED ORDER — IPRATROPIUM BROMIDE 42 MCG (0.06 %) NA SPRY
1 | Freq: Two times a day (BID) | NASAL | 0 refills | Status: DC
Start: 2017-07-03 — End: 2017-07-03

## 2017-07-03 MED ORDER — CLOPIDOGREL 75 MG PO TAB
75 mg | Freq: Every day | ORAL | 0 refills | Status: DC
Start: 2017-07-03 — End: 2017-07-03

## 2017-07-04 LAB — BASIC METABOLIC PANEL: Lab: 135 MMOL/L — ABNORMAL LOW (ref 137–147)

## 2017-07-04 LAB — POC GLUCOSE: Lab: 137 mg/dL — ABNORMAL HIGH (ref 70–100)

## 2017-07-04 LAB — CBC
Lab: 2.7 M/UL — ABNORMAL LOW (ref >60)
Lab: 5.2 K/UL — ABNORMAL LOW (ref >60)

## 2017-07-04 LAB — MAGNESIUM: Lab: 1.3 mg/dL — ABNORMAL LOW (ref 1.6–2.6)

## 2017-07-04 MED ORDER — BUDESONIDE 0.25 MG/2 ML IN NBSP
1.5 mg | Freq: Four times a day (QID) | RESPIRATORY_TRACT | 0 refills | Status: DC
Start: 2017-07-04 — End: 2017-07-06
  Administered 2017-07-05 – 2017-07-06 (×3): 1.5 mg via RESPIRATORY_TRACT

## 2017-07-04 MED ORDER — BUMETANIDE 0.25 MG/ML IJ SOLN
4 mg | Freq: Two times a day (BID) | INTRAVENOUS | 0 refills | Status: DC
Start: 2017-07-04 — End: 2017-07-09
  Administered 2017-07-04 – 2017-07-09 (×11): 4 mg via INTRAVENOUS

## 2017-07-04 MED ORDER — INHALATIONAL SPACING DEVICE MISC SPCR
0 refills | Status: DC
Start: 2017-07-04 — End: 2017-07-09

## 2017-07-04 MED ORDER — METOPROLOL SUCCINATE 25 MG PO TB24
25 mg | Freq: Every day | ORAL | 0 refills | Status: DC
Start: 2017-07-04 — End: 2017-07-09
  Administered 2017-07-04 – 2017-07-09 (×6): 25 mg via ORAL

## 2017-07-04 MED ORDER — ARFORMOTEROL 15 MCG/2 ML IN NEBU
15 ug | Freq: Two times a day (BID) | RESPIRATORY_TRACT | 0 refills | Status: DC
Start: 2017-07-04 — End: 2017-07-09
  Administered 2017-07-05 – 2017-07-09 (×8): 15 ug via RESPIRATORY_TRACT

## 2017-07-04 MED ORDER — POTASSIUM CHLORIDE 20 MEQ PO TBTQ
40 meq | ORAL | 0 refills | Status: CP
Start: 2017-07-04 — End: ?
  Administered 2017-07-04 (×2): 40 meq via ORAL

## 2017-07-04 MED ORDER — BUMETANIDE 0.25 MG/ML IJ SOLN
1 mg | Freq: Once | INTRAVENOUS | 0 refills | Status: CP
Start: 2017-07-04 — End: ?
  Administered 2017-07-04: 17:00:00 1 mg via INTRAVENOUS

## 2017-07-04 MED ORDER — MAGNESIUM SULFATE IN D5W 1 GRAM/100 ML IV PGBK
1 g | INTRAVENOUS | 0 refills | Status: CP
Start: 2017-07-04 — End: ?
  Administered 2017-07-04 (×2): 1 g via INTRAVENOUS

## 2017-07-05 DIAGNOSIS — M79641 Pain in right hand: ICD-10-CM

## 2017-07-05 LAB — BASIC METABOLIC PANEL: Lab: 134 MMOL/L — ABNORMAL LOW (ref 137–147)

## 2017-07-05 LAB — MAGNESIUM: Lab: 1.8 mg/dL — ABNORMAL HIGH (ref >60)

## 2017-07-05 LAB — CBC: Lab: 5.9 K/UL — ABNORMAL LOW (ref >60)

## 2017-07-05 MED ORDER — CHLOROTHIAZIDE SODIUM 500 MG IV SOLR
500 mg | Freq: Once | INTRAVENOUS | 0 refills | Status: CP
Start: 2017-07-05 — End: ?
  Administered 2017-07-05: 15:00:00 500 mg via INTRAVENOUS

## 2017-07-05 MED ORDER — METHYLPREDNISOLONE SOD SUC(PF) 125 MG/2 ML IJ SOLR
62.5 mg | Freq: Once | INTRAVENOUS | 0 refills | Status: CP
Start: 2017-07-05 — End: ?
  Administered 2017-07-05: 15:00:00 62.5 mg via INTRAVENOUS

## 2017-07-05 MED ORDER — METHYLPREDNISOLONE SOD SUC(PF) 125 MG/2 ML IJ SOLR
62.5 mg | Freq: Once | INTRAVENOUS | 0 refills | Status: DC
Start: 2017-07-05 — End: 2017-07-05

## 2017-07-05 MED ORDER — MAGNESIUM HYDROXIDE 2,400 MG/10 ML PO SUSP
10 mL | Freq: Once | ORAL | 0 refills | Status: CP
Start: 2017-07-05 — End: ?
  Administered 2017-07-05: 23:00:00 10 mL via ORAL

## 2017-07-06 LAB — MAGNESIUM: Lab: 2.1 mg/dL — ABNORMAL HIGH (ref 1.6–2.6)

## 2017-07-06 LAB — CBC: Lab: 6.1 K/UL — ABNORMAL LOW (ref 4.5–11.0)

## 2017-07-06 LAB — BASIC METABOLIC PANEL: Lab: 133 MMOL/L — ABNORMAL LOW (ref >60)

## 2017-07-06 MED ORDER — BUDESONIDE 0.5 MG/2 ML IN NBSP
1.5 mg | Freq: Four times a day (QID) | RESPIRATORY_TRACT | 0 refills | Status: DC
Start: 2017-07-06 — End: 2017-07-09
  Administered 2017-07-06 – 2017-07-09 (×13): 1.5 mg via RESPIRATORY_TRACT

## 2017-07-06 MED ORDER — CHLOROTHIAZIDE SODIUM 500 MG IV SOLR
500 mg | Freq: Once | INTRAVENOUS | 0 refills | Status: CP
Start: 2017-07-06 — End: ?
  Administered 2017-07-06: 20:00:00 500 mg via INTRAVENOUS

## 2017-07-07 LAB — CBC: Lab: 5.5 K/UL — ABNORMAL HIGH (ref >60)

## 2017-07-07 LAB — MAGNESIUM: Lab: 2.2 mg/dL — ABNORMAL LOW (ref 1.6–2.6)

## 2017-07-07 LAB — BASIC METABOLIC PANEL: Lab: 134 MMOL/L — ABNORMAL LOW (ref 137–147)

## 2017-07-08 LAB — BASIC METABOLIC PANEL: Lab: 136 MMOL/L — ABNORMAL LOW (ref 137–147)

## 2017-07-08 LAB — MAGNESIUM: Lab: 2 mg/dL — ABNORMAL LOW (ref 1.6–2.6)

## 2017-07-08 LAB — CBC
Lab: 10 g/dL — ABNORMAL LOW (ref 13.5–16.5)
Lab: 108 FL — ABNORMAL HIGH (ref 80–100)
Lab: 16 % — ABNORMAL HIGH (ref 11–15)
Lab: 31 % — ABNORMAL LOW (ref 40–50)
Lab: 32 g/dL — ABNORMAL HIGH (ref 32.0–36.0)
Lab: 35 pg — ABNORMAL HIGH (ref 26–34)
Lab: 355 10*3/uL — ABNORMAL LOW (ref >60)
Lab: 6.1 10*3/uL (ref 4.5–11.0)
Lab: 7.7 FL — ABNORMAL LOW (ref >60)

## 2017-07-08 MED ORDER — LACTULOSE 10 GRAM/15 ML PO SOLN
30 mL | Freq: Once | ORAL | 0 refills | Status: CP
Start: 2017-07-08 — End: ?
  Administered 2017-07-09: 12:00:00 20 g via ORAL

## 2017-07-08 MED ORDER — CHLOROTHIAZIDE SODIUM 500 MG IV SOLR
250 mg | Freq: Once | INTRAVENOUS | 0 refills | Status: CP
Start: 2017-07-08 — End: ?
  Administered 2017-07-08: 22:00:00 250 mg via INTRAVENOUS

## 2017-07-08 MED ORDER — TRAZODONE 50 MG PO TAB
50 mg | Freq: Once | ORAL | 0 refills | Status: CP
Start: 2017-07-08 — End: ?
  Administered 2017-07-09: 03:00:00 50 mg via ORAL

## 2017-07-09 ENCOUNTER — Encounter: Admit: 2017-06-29 | Discharge: 2017-06-30 | Payer: MEDICARE

## 2017-07-09 ENCOUNTER — Inpatient Hospital Stay: Admit: 2017-07-05 | Discharge: 2017-07-05 | Payer: MEDICARE

## 2017-07-09 ENCOUNTER — Emergency Department: Admit: 2017-07-02 | Discharge: 2017-07-02 | Payer: MEDICARE

## 2017-07-09 ENCOUNTER — Inpatient Hospital Stay: Admit: 2017-07-02 | Discharge: 2017-07-09 | Disposition: A | Discharge status: Home / Self Care | Payer: MEDICARE

## 2017-07-09 ENCOUNTER — Inpatient Hospital Stay: Admit: 2017-07-03 | Discharge: 2017-07-03 | Payer: MEDICARE

## 2017-07-09 DIAGNOSIS — N183 Chronic kidney disease, stage 3 (moderate): ICD-10-CM

## 2017-07-09 DIAGNOSIS — Z955 Presence of coronary angioplasty implant and graft: ICD-10-CM

## 2017-07-09 DIAGNOSIS — I480 Paroxysmal atrial fibrillation: ICD-10-CM

## 2017-07-09 DIAGNOSIS — G4733 Obstructive sleep apnea (adult) (pediatric): ICD-10-CM

## 2017-07-09 DIAGNOSIS — Z79899 Other long term (current) drug therapy: ICD-10-CM

## 2017-07-09 DIAGNOSIS — I2582 Chronic total occlusion of coronary artery: ICD-10-CM

## 2017-07-09 DIAGNOSIS — Z9049 Acquired absence of other specified parts of digestive tract: ICD-10-CM

## 2017-07-09 DIAGNOSIS — I2510 Atherosclerotic heart disease of native coronary artery without angina pectoris: ICD-10-CM

## 2017-07-09 DIAGNOSIS — E782 Mixed hyperlipidemia: ICD-10-CM

## 2017-07-09 DIAGNOSIS — I9789 Other postprocedural complications and disorders of the circulatory system, not elsewhere classified: Principal | ICD-10-CM

## 2017-07-09 DIAGNOSIS — E7800 Pure hypercholesterolemia, unspecified: ICD-10-CM

## 2017-07-09 DIAGNOSIS — S65191A Other specified injury of radial artery at wrist and hand level of right arm, initial encounter: ICD-10-CM

## 2017-07-09 DIAGNOSIS — I5043 Acute on chronic combined systolic (congestive) and diastolic (congestive) heart failure: ICD-10-CM

## 2017-07-09 DIAGNOSIS — M109 Gout, unspecified: ICD-10-CM

## 2017-07-09 DIAGNOSIS — J449 Chronic obstructive pulmonary disease, unspecified: ICD-10-CM

## 2017-07-09 DIAGNOSIS — I255 Ischemic cardiomyopathy: ICD-10-CM

## 2017-07-09 DIAGNOSIS — I130 Hypertensive heart and chronic kidney disease with heart failure and stage 1 through stage 4 chronic kidney disease, or unspecified chronic kidney disease: ICD-10-CM

## 2017-07-09 DIAGNOSIS — Z6841 Body Mass Index (BMI) 40.0 and over, adult: ICD-10-CM

## 2017-07-09 DIAGNOSIS — K219 Gastro-esophageal reflux disease without esophagitis: ICD-10-CM

## 2017-07-09 LAB — MAGNESIUM: Lab: 2.1 mg/dL — ABNORMAL LOW (ref 1.6–2.6)

## 2017-07-09 LAB — BASIC METABOLIC PANEL: Lab: 135 MMOL/L — ABNORMAL LOW (ref 137–147)

## 2017-07-09 LAB — CBC: Lab: 6.9 K/UL — ABNORMAL LOW (ref 4.5–11.0)

## 2017-07-09 MED ORDER — TRAMADOL 50 MG PO TAB
50-100 mg | ORAL_TABLET | ORAL | 0 refills | Status: AC | PRN
Start: 2017-07-09 — End: 2017-10-30

## 2017-07-09 MED ORDER — BUMETANIDE 2 MG PO TAB
4 mg | Freq: Two times a day (BID) | ORAL | 0 refills | Status: DC
Start: 2017-07-09 — End: 2017-07-09

## 2017-07-09 MED ORDER — BUMETANIDE 2 MG PO TAB
4 mg | ORAL_TABLET | Freq: Two times a day (BID) | ORAL | 1 refills | Status: AC
Start: 2017-07-09 — End: 2017-11-09

## 2017-07-10 ENCOUNTER — Encounter: Admit: 2017-07-10 | Discharge: 2017-07-10 | Payer: MEDICARE

## 2017-07-10 ENCOUNTER — Ambulatory Visit: Admit: 2017-07-10 | Discharge: 2017-07-11 | Payer: MEDICARE

## 2017-07-10 DIAGNOSIS — I5042 Chronic combined systolic (congestive) and diastolic (congestive) heart failure: Principal | ICD-10-CM

## 2017-07-13 ENCOUNTER — Ambulatory Visit: Admit: 2017-07-13 | Discharge: 2017-07-13 | Payer: MEDICARE

## 2017-07-13 ENCOUNTER — Encounter: Admit: 2017-07-13 | Discharge: 2017-07-13 | Payer: MEDICARE

## 2017-07-13 DIAGNOSIS — I255 Ischemic cardiomyopathy: ICD-10-CM

## 2017-07-13 DIAGNOSIS — I2510 Atherosclerotic heart disease of native coronary artery without angina pectoris: ICD-10-CM

## 2017-07-13 DIAGNOSIS — E785 Hyperlipidemia, unspecified: ICD-10-CM

## 2017-07-13 DIAGNOSIS — Z95828 Presence of other vascular implants and grafts: ICD-10-CM

## 2017-07-13 DIAGNOSIS — J45909 Unspecified asthma, uncomplicated: ICD-10-CM

## 2017-07-13 DIAGNOSIS — I5042 Chronic combined systolic (congestive) and diastolic (congestive) heart failure: Principal | ICD-10-CM

## 2017-07-13 DIAGNOSIS — Z9981 Dependence on supplemental oxygen: ICD-10-CM

## 2017-07-13 DIAGNOSIS — N183 Chronic kidney disease, stage 3 (moderate): ICD-10-CM

## 2017-07-13 DIAGNOSIS — E669 Obesity, unspecified: ICD-10-CM

## 2017-07-13 DIAGNOSIS — I10 Essential (primary) hypertension: Secondary | ICD-10-CM

## 2017-07-13 DIAGNOSIS — J302 Other seasonal allergic rhinitis: ICD-10-CM

## 2017-07-13 DIAGNOSIS — K219 Gastro-esophageal reflux disease without esophagitis: ICD-10-CM

## 2017-07-13 DIAGNOSIS — G4733 Obstructive sleep apnea (adult) (pediatric): ICD-10-CM

## 2017-07-13 DIAGNOSIS — I509 Heart failure, unspecified: ICD-10-CM

## 2017-07-13 DIAGNOSIS — Z8614 Personal history of Methicillin resistant Staphylococcus aureus infection: ICD-10-CM

## 2017-07-13 DIAGNOSIS — J189 Pneumonia, unspecified organism: ICD-10-CM

## 2017-07-13 DIAGNOSIS — E782 Mixed hyperlipidemia: ICD-10-CM

## 2017-07-13 DIAGNOSIS — Z8679 Personal history of other diseases of the circulatory system: ICD-10-CM

## 2017-07-13 DIAGNOSIS — M961 Postlaminectomy syndrome, not elsewhere classified: ICD-10-CM

## 2017-07-13 DIAGNOSIS — L03116 Cellulitis of left lower limb: ICD-10-CM

## 2017-07-13 DIAGNOSIS — J449 Chronic obstructive pulmonary disease, unspecified: ICD-10-CM

## 2017-07-13 DIAGNOSIS — I714 Abdominal aortic aneurysm, without rupture: ICD-10-CM

## 2017-07-13 DIAGNOSIS — R609 Edema, unspecified: ICD-10-CM

## 2017-07-13 DIAGNOSIS — M954 Acquired deformity of chest and rib: ICD-10-CM

## 2017-07-13 DIAGNOSIS — R0600 Dyspnea, unspecified: ICD-10-CM

## 2017-07-13 DIAGNOSIS — R05 Cough: ICD-10-CM

## 2017-07-13 DIAGNOSIS — G4730 Sleep apnea, unspecified: ICD-10-CM

## 2017-07-13 DIAGNOSIS — J479 Bronchiectasis, uncomplicated: ICD-10-CM

## 2017-07-13 DIAGNOSIS — I429 Cardiomyopathy, unspecified: ICD-10-CM

## 2017-07-19 LAB — BASIC METABOLIC PANEL
Lab: 139 K/UL — ABNORMAL LOW (ref 150–400)
Lab: 3.9 FL — ABNORMAL HIGH (ref 7–11)

## 2017-07-20 ENCOUNTER — Encounter: Admit: 2017-07-20 | Discharge: 2017-07-20 | Payer: MEDICARE

## 2017-07-20 DIAGNOSIS — N183 Chronic kidney disease, stage 3 (moderate): Principal | ICD-10-CM

## 2017-07-27 ENCOUNTER — Encounter: Admit: 2017-07-27 | Discharge: 2017-07-27 | Payer: MEDICARE

## 2017-07-30 ENCOUNTER — Encounter: Admit: 2017-07-30 | Discharge: 2017-07-30 | Payer: MEDICARE

## 2017-07-30 DIAGNOSIS — N183 Chronic kidney disease, stage 3 (moderate): ICD-10-CM

## 2017-07-30 DIAGNOSIS — M4726 Other spondylosis with radiculopathy, lumbar region: ICD-10-CM

## 2017-07-30 DIAGNOSIS — M4807 Spinal stenosis, lumbosacral region: ICD-10-CM

## 2017-07-30 DIAGNOSIS — I714 Abdominal aortic aneurysm, without rupture: ICD-10-CM

## 2017-08-06 ENCOUNTER — Encounter: Admit: 2017-08-06 | Discharge: 2017-08-06 | Payer: MEDICARE

## 2017-08-10 ENCOUNTER — Ambulatory Visit: Admit: 2017-08-10 | Discharge: 2017-08-11 | Payer: MEDICARE

## 2017-08-10 ENCOUNTER — Encounter: Admit: 2017-08-10 | Discharge: 2017-08-10 | Payer: MEDICARE

## 2017-08-11 DIAGNOSIS — I5042 Chronic combined systolic (congestive) and diastolic (congestive) heart failure: Principal | ICD-10-CM

## 2017-08-13 ENCOUNTER — Encounter: Admit: 2017-08-13 | Discharge: 2017-08-13 | Payer: MEDICARE

## 2017-08-13 DIAGNOSIS — J439 Emphysema, unspecified: ICD-10-CM

## 2017-08-13 DIAGNOSIS — J4541 Moderate persistent asthma with (acute) exacerbation: Principal | ICD-10-CM

## 2017-08-13 DIAGNOSIS — R0602 Shortness of breath: ICD-10-CM

## 2017-08-14 ENCOUNTER — Encounter: Admit: 2017-08-14 | Discharge: 2017-08-14 | Payer: MEDICARE

## 2017-08-14 DIAGNOSIS — G4733 Obstructive sleep apnea (adult) (pediatric): Principal | ICD-10-CM

## 2017-08-15 ENCOUNTER — Ambulatory Visit: Admit: 2017-08-15 | Discharge: 2017-08-14 | Payer: MEDICARE

## 2017-08-16 ENCOUNTER — Encounter: Admit: 2017-08-16 | Discharge: 2017-08-16 | Payer: MEDICARE

## 2017-08-22 ENCOUNTER — Encounter: Admit: 2017-08-22 | Discharge: 2017-08-22 | Payer: MEDICARE

## 2017-08-23 ENCOUNTER — Ambulatory Visit: Admit: 2017-08-22 | Discharge: 2017-08-23 | Payer: MEDICARE

## 2017-08-23 DIAGNOSIS — I5042 Chronic combined systolic (congestive) and diastolic (congestive) heart failure: Principal | ICD-10-CM

## 2017-08-23 DIAGNOSIS — Z95810 Presence of automatic (implantable) cardiac defibrillator: Secondary | ICD-10-CM

## 2017-08-24 ENCOUNTER — Encounter: Admit: 2017-08-24 | Discharge: 2017-08-24 | Payer: MEDICARE

## 2017-08-28 ENCOUNTER — Encounter: Admit: 2017-08-28 | Discharge: 2017-08-28 | Payer: MEDICARE

## 2017-08-28 ENCOUNTER — Ambulatory Visit: Admit: 2017-08-28 | Discharge: 2017-08-28 | Payer: MEDICARE

## 2017-08-28 DIAGNOSIS — E782 Mixed hyperlipidemia: ICD-10-CM

## 2017-08-28 DIAGNOSIS — I10 Essential (primary) hypertension: ICD-10-CM

## 2017-08-28 DIAGNOSIS — J302 Other seasonal allergic rhinitis: ICD-10-CM

## 2017-08-28 DIAGNOSIS — L03116 Cellulitis of left lower limb: ICD-10-CM

## 2017-08-28 DIAGNOSIS — E7800 Pure hypercholesterolemia, unspecified: ICD-10-CM

## 2017-08-28 DIAGNOSIS — I2510 Atherosclerotic heart disease of native coronary artery without angina pectoris: ICD-10-CM

## 2017-08-28 DIAGNOSIS — J984 Other disorders of lung: ICD-10-CM

## 2017-08-28 DIAGNOSIS — J449 Chronic obstructive pulmonary disease, unspecified: ICD-10-CM

## 2017-08-28 DIAGNOSIS — R0600 Dyspnea, unspecified: ICD-10-CM

## 2017-08-28 DIAGNOSIS — I509 Heart failure, unspecified: Principal | ICD-10-CM

## 2017-08-28 DIAGNOSIS — I5042 Chronic combined systolic (congestive) and diastolic (congestive) heart failure: ICD-10-CM

## 2017-08-28 DIAGNOSIS — R110 Nausea: ICD-10-CM

## 2017-08-28 DIAGNOSIS — N4231 Prostatic intraepithelial neoplasia: ICD-10-CM

## 2017-08-28 DIAGNOSIS — I255 Ischemic cardiomyopathy: ICD-10-CM

## 2017-08-28 DIAGNOSIS — M954 Acquired deformity of chest and rib: ICD-10-CM

## 2017-08-28 DIAGNOSIS — Z8679 Personal history of other diseases of the circulatory system: ICD-10-CM

## 2017-08-28 DIAGNOSIS — J189 Pneumonia, unspecified organism: ICD-10-CM

## 2017-08-28 DIAGNOSIS — R001 Bradycardia, unspecified: Secondary | ICD-10-CM

## 2017-08-28 DIAGNOSIS — G4733 Obstructive sleep apnea (adult) (pediatric): ICD-10-CM

## 2017-08-28 DIAGNOSIS — I429 Cardiomyopathy, unspecified: ICD-10-CM

## 2017-08-28 DIAGNOSIS — R05 Cough: ICD-10-CM

## 2017-08-28 DIAGNOSIS — R972 Elevated prostate specific antigen [PSA]: ICD-10-CM

## 2017-08-28 DIAGNOSIS — M961 Postlaminectomy syndrome, not elsewhere classified: ICD-10-CM

## 2017-08-28 DIAGNOSIS — J479 Bronchiectasis, uncomplicated: ICD-10-CM

## 2017-08-28 DIAGNOSIS — K219 Gastro-esophageal reflux disease without esophagitis: ICD-10-CM

## 2017-08-28 DIAGNOSIS — M10242 Drug-induced gout, left hand: ICD-10-CM

## 2017-08-28 DIAGNOSIS — M25532 Pain in left wrist: ICD-10-CM

## 2017-08-28 DIAGNOSIS — N183 Chronic kidney disease, stage 3 (moderate): ICD-10-CM

## 2017-08-28 DIAGNOSIS — J45909 Unspecified asthma, uncomplicated: ICD-10-CM

## 2017-08-28 DIAGNOSIS — I714 Abdominal aortic aneurysm, without rupture: ICD-10-CM

## 2017-08-28 DIAGNOSIS — Z95828 Presence of other vascular implants and grafts: ICD-10-CM

## 2017-08-28 DIAGNOSIS — R609 Edema, unspecified: ICD-10-CM

## 2017-08-28 DIAGNOSIS — Z6841 Body Mass Index (BMI) 40.0 and over, adult: ICD-10-CM

## 2017-08-28 DIAGNOSIS — Z8614 Personal history of Methicillin resistant Staphylococcus aureus infection: ICD-10-CM

## 2017-08-28 DIAGNOSIS — Z9981 Dependence on supplemental oxygen: ICD-10-CM

## 2017-08-28 LAB — COMPREHENSIVE METABOLIC PANEL
Lab: 1.3 mg/dL — ABNORMAL HIGH (ref 0.4–1.24)
Lab: 10 10*3/uL — ABNORMAL LOW (ref 3–12)
Lab: 109 mg/dL — ABNORMAL HIGH (ref 70–100)
Lab: 134 MMOL/L — ABNORMAL LOW (ref 137–147)
Lab: 15 U/L (ref 7–56)
Lab: 16 U/L (ref 7–40)
Lab: 25 MMOL/L (ref 21–30)
Lab: 3.8 MMOL/L — ABNORMAL LOW (ref 3.5–5.1)
Lab: 46 mg/dL — ABNORMAL HIGH (ref 7–25)
Lab: 52 mL/min — ABNORMAL LOW (ref >60)
Lab: 99 MMOL/L — ABNORMAL HIGH (ref 98–110)
Lab: >60 mL/min (ref >60)

## 2017-08-28 LAB — PROSTATIC SPECIFIC ANTIGEN-PSA: Lab: 10 ng/mL — ABNORMAL HIGH (ref <6.01)

## 2017-08-28 LAB — CBC AND DIFF
Lab: 0 10*3/uL (ref 0–0.20)
Lab: 3.3 M/UL — ABNORMAL LOW (ref 4.4–5.5)
Lab: 7.1 10*3/uL (ref 4.5–11.0)

## 2017-08-28 MED ORDER — COLCHICINE 0.6 MG PO TAB
.6 mg | ORAL_TABLET | Freq: Every day | ORAL | 0 refills | Status: AC
Start: 2017-08-28 — End: ?

## 2017-08-28 MED ORDER — CEPHALEXIN 500 MG PO CAP
500 mg | ORAL_CAPSULE | Freq: Four times a day (QID) | ORAL | 0 refills | Status: AC
Start: 2017-08-28 — End: ?

## 2017-08-29 ENCOUNTER — Encounter: Admit: 2017-08-29 | Discharge: 2017-08-29 | Payer: MEDICARE

## 2017-09-06 ENCOUNTER — Ambulatory Visit: Admit: 2017-09-06 | Discharge: 2017-09-07 | Payer: MEDICARE

## 2017-09-06 ENCOUNTER — Encounter: Admit: 2017-09-06 | Discharge: 2017-09-06 | Payer: MEDICARE

## 2017-09-06 ENCOUNTER — Ambulatory Visit: Admit: 2017-09-06 | Discharge: 2017-09-06 | Payer: MEDICARE

## 2017-09-06 DIAGNOSIS — I714 Abdominal aortic aneurysm, without rupture: ICD-10-CM

## 2017-09-06 DIAGNOSIS — L03116 Cellulitis of left lower limb: ICD-10-CM

## 2017-09-06 DIAGNOSIS — Z95828 Presence of other vascular implants and grafts: ICD-10-CM

## 2017-09-06 DIAGNOSIS — R05 Cough: ICD-10-CM

## 2017-09-06 DIAGNOSIS — M961 Postlaminectomy syndrome, not elsewhere classified: ICD-10-CM

## 2017-09-06 DIAGNOSIS — I2510 Atherosclerotic heart disease of native coronary artery without angina pectoris: ICD-10-CM

## 2017-09-06 DIAGNOSIS — M954 Acquired deformity of chest and rib: ICD-10-CM

## 2017-09-06 DIAGNOSIS — R609 Edema, unspecified: ICD-10-CM

## 2017-09-06 DIAGNOSIS — J302 Other seasonal allergic rhinitis: ICD-10-CM

## 2017-09-06 DIAGNOSIS — Z8614 Personal history of Methicillin resistant Staphylococcus aureus infection: ICD-10-CM

## 2017-09-06 DIAGNOSIS — G4733 Obstructive sleep apnea (adult) (pediatric): ICD-10-CM

## 2017-09-06 DIAGNOSIS — I429 Cardiomyopathy, unspecified: ICD-10-CM

## 2017-09-06 DIAGNOSIS — N183 Chronic kidney disease, stage 3 (moderate): ICD-10-CM

## 2017-09-06 DIAGNOSIS — J449 Chronic obstructive pulmonary disease, unspecified: ICD-10-CM

## 2017-09-06 DIAGNOSIS — Z8679 Personal history of other diseases of the circulatory system: ICD-10-CM

## 2017-09-06 DIAGNOSIS — J479 Bronchiectasis, uncomplicated: ICD-10-CM

## 2017-09-06 DIAGNOSIS — E782 Mixed hyperlipidemia: ICD-10-CM

## 2017-09-06 DIAGNOSIS — I5042 Chronic combined systolic (congestive) and diastolic (congestive) heart failure: ICD-10-CM

## 2017-09-06 DIAGNOSIS — I10 Essential (primary) hypertension: ICD-10-CM

## 2017-09-06 DIAGNOSIS — J189 Pneumonia, unspecified organism: ICD-10-CM

## 2017-09-06 DIAGNOSIS — J45909 Unspecified asthma, uncomplicated: ICD-10-CM

## 2017-09-06 DIAGNOSIS — K219 Gastro-esophageal reflux disease without esophagitis: ICD-10-CM

## 2017-09-06 DIAGNOSIS — R0600 Dyspnea, unspecified: ICD-10-CM

## 2017-09-06 DIAGNOSIS — I509 Heart failure, unspecified: Principal | ICD-10-CM

## 2017-09-06 DIAGNOSIS — Z9981 Dependence on supplemental oxygen: ICD-10-CM

## 2017-09-11 ENCOUNTER — Encounter: Admit: 2017-09-11 | Discharge: 2017-09-11 | Payer: MEDICARE

## 2017-09-11 ENCOUNTER — Ambulatory Visit: Admit: 2017-09-11 | Discharge: 2017-09-11 | Payer: MEDICARE

## 2017-09-11 DIAGNOSIS — B999 Unspecified infectious disease: ICD-10-CM

## 2017-09-11 DIAGNOSIS — I2510 Atherosclerotic heart disease of native coronary artery without angina pectoris: ICD-10-CM

## 2017-09-11 DIAGNOSIS — I5042 Chronic combined systolic (congestive) and diastolic (congestive) heart failure: Principal | ICD-10-CM

## 2017-09-11 DIAGNOSIS — R05 Cough: ICD-10-CM

## 2017-09-11 DIAGNOSIS — Z9981 Dependence on supplemental oxygen: ICD-10-CM

## 2017-09-11 DIAGNOSIS — E782 Mixed hyperlipidemia: ICD-10-CM

## 2017-09-11 DIAGNOSIS — I255 Ischemic cardiomyopathy: ICD-10-CM

## 2017-09-11 DIAGNOSIS — J479 Bronchiectasis, uncomplicated: ICD-10-CM

## 2017-09-11 DIAGNOSIS — M954 Acquired deformity of chest and rib: ICD-10-CM

## 2017-09-11 DIAGNOSIS — R0600 Dyspnea, unspecified: ICD-10-CM

## 2017-09-11 DIAGNOSIS — D801 Nonfamilial hypogammaglobulinemia: ICD-10-CM

## 2017-09-11 DIAGNOSIS — G4733 Obstructive sleep apnea (adult) (pediatric): ICD-10-CM

## 2017-09-11 DIAGNOSIS — I714 Abdominal aortic aneurysm, without rupture: ICD-10-CM

## 2017-09-11 DIAGNOSIS — I509 Heart failure, unspecified: Principal | ICD-10-CM

## 2017-09-11 DIAGNOSIS — R609 Edema, unspecified: ICD-10-CM

## 2017-09-11 DIAGNOSIS — I5043 Acute on chronic combined systolic (congestive) and diastolic (congestive) heart failure: ICD-10-CM

## 2017-09-11 DIAGNOSIS — I493 Ventricular premature depolarization: Secondary | ICD-10-CM

## 2017-09-11 DIAGNOSIS — I10 Essential (primary) hypertension: ICD-10-CM

## 2017-09-11 DIAGNOSIS — K219 Gastro-esophageal reflux disease without esophagitis: ICD-10-CM

## 2017-09-11 DIAGNOSIS — Z8679 Personal history of other diseases of the circulatory system: ICD-10-CM

## 2017-09-11 DIAGNOSIS — Z95828 Presence of other vascular implants and grafts: ICD-10-CM

## 2017-09-11 DIAGNOSIS — J449 Chronic obstructive pulmonary disease, unspecified: ICD-10-CM

## 2017-09-11 DIAGNOSIS — J189 Pneumonia, unspecified organism: ICD-10-CM

## 2017-09-11 DIAGNOSIS — J302 Other seasonal allergic rhinitis: ICD-10-CM

## 2017-09-11 DIAGNOSIS — J45909 Unspecified asthma, uncomplicated: ICD-10-CM

## 2017-09-11 DIAGNOSIS — L03116 Cellulitis of left lower limb: ICD-10-CM

## 2017-09-11 DIAGNOSIS — I429 Cardiomyopathy, unspecified: ICD-10-CM

## 2017-09-11 DIAGNOSIS — N183 Chronic kidney disease, stage 3 (moderate): Secondary | ICD-10-CM

## 2017-09-11 DIAGNOSIS — Z8614 Personal history of Methicillin resistant Staphylococcus aureus infection: ICD-10-CM

## 2017-09-11 DIAGNOSIS — M961 Postlaminectomy syndrome, not elsewhere classified: ICD-10-CM

## 2017-09-11 DIAGNOSIS — J439 Emphysema, unspecified: ICD-10-CM

## 2017-09-11 LAB — BASIC METABOLIC PANEL
Lab: 1.5 mg/dL — ABNORMAL HIGH (ref 0.4–1.24)
Lab: 100 mg/dL (ref 70–100)
Lab: 11 (ref 3–12)
Lab: 134 MMOL/L — ABNORMAL LOW (ref 137–147)
Lab: 28 MMOL/L (ref 21–30)
Lab: 4.1 MMOL/L (ref 3.5–5.1)
Lab: 44 mL/min — ABNORMAL LOW (ref >60)
Lab: 45 mg/dL — ABNORMAL HIGH (ref 7–25)
Lab: 53 mL/min — ABNORMAL LOW (ref >60)
Lab: 9.7 mg/dL (ref 8.5–10.6)
Lab: 95 MMOL/L — ABNORMAL LOW (ref 98–110)

## 2017-09-11 LAB — IMMUNOGLOBULINS-IGA,IGG,IGM
Lab: 105 mg/dL — ABNORMAL LOW (ref 38–328)
Lab: 238 mg/dL — ABNORMAL LOW (ref 70–390)
Lab: 737 mg/dL — ABNORMAL LOW (ref 762–1488)

## 2017-09-12 ENCOUNTER — Encounter: Admit: 2017-09-12 | Discharge: 2017-09-12 | Payer: MEDICARE

## 2017-09-13 ENCOUNTER — Encounter: Admit: 2017-09-13 | Discharge: 2017-09-13 | Payer: MEDICARE

## 2017-09-13 DIAGNOSIS — Z23 Encounter for immunization: Principal | ICD-10-CM

## 2017-09-13 LAB — STREPTOCOCCUS PNEUMONIAE IGG AB (23 SEROTYPES)
Lab: 0.3 — ABNORMAL LOW
Lab: 2.4

## 2017-09-20 ENCOUNTER — Encounter: Admit: 2017-09-20 | Discharge: 2017-09-20 | Payer: MEDICARE

## 2017-09-20 ENCOUNTER — Ambulatory Visit: Admit: 2017-09-20 | Discharge: 2017-09-21 | Payer: MEDICARE

## 2017-09-20 DIAGNOSIS — N183 Chronic kidney disease, stage 3 (moderate): ICD-10-CM

## 2017-09-20 DIAGNOSIS — J45909 Unspecified asthma, uncomplicated: ICD-10-CM

## 2017-09-20 DIAGNOSIS — E782 Mixed hyperlipidemia: ICD-10-CM

## 2017-09-20 DIAGNOSIS — Z9981 Dependence on supplemental oxygen: ICD-10-CM

## 2017-09-20 DIAGNOSIS — R0600 Dyspnea, unspecified: ICD-10-CM

## 2017-09-20 DIAGNOSIS — M961 Postlaminectomy syndrome, not elsewhere classified: ICD-10-CM

## 2017-09-20 DIAGNOSIS — Z95828 Presence of other vascular implants and grafts: ICD-10-CM

## 2017-09-20 DIAGNOSIS — R609 Edema, unspecified: ICD-10-CM

## 2017-09-20 DIAGNOSIS — J302 Other seasonal allergic rhinitis: ICD-10-CM

## 2017-09-20 DIAGNOSIS — K219 Gastro-esophageal reflux disease without esophagitis: ICD-10-CM

## 2017-09-20 DIAGNOSIS — L03116 Cellulitis of left lower limb: ICD-10-CM

## 2017-09-20 DIAGNOSIS — I5042 Chronic combined systolic (congestive) and diastolic (congestive) heart failure: ICD-10-CM

## 2017-09-20 DIAGNOSIS — Z8679 Personal history of other diseases of the circulatory system: ICD-10-CM

## 2017-09-20 DIAGNOSIS — J189 Pneumonia, unspecified organism: ICD-10-CM

## 2017-09-20 DIAGNOSIS — J479 Bronchiectasis, uncomplicated: ICD-10-CM

## 2017-09-20 DIAGNOSIS — R05 Cough: ICD-10-CM

## 2017-09-20 DIAGNOSIS — I2510 Atherosclerotic heart disease of native coronary artery without angina pectoris: Secondary | ICD-10-CM

## 2017-09-20 DIAGNOSIS — I10 Essential (primary) hypertension: ICD-10-CM

## 2017-09-20 DIAGNOSIS — Z8614 Personal history of Methicillin resistant Staphylococcus aureus infection: ICD-10-CM

## 2017-09-20 DIAGNOSIS — M954 Acquired deformity of chest and rib: ICD-10-CM

## 2017-09-20 DIAGNOSIS — G4733 Obstructive sleep apnea (adult) (pediatric): ICD-10-CM

## 2017-09-20 DIAGNOSIS — I714 Abdominal aortic aneurysm, without rupture: ICD-10-CM

## 2017-09-20 DIAGNOSIS — I509 Heart failure, unspecified: Principal | ICD-10-CM

## 2017-09-20 DIAGNOSIS — J449 Chronic obstructive pulmonary disease, unspecified: ICD-10-CM

## 2017-09-20 DIAGNOSIS — I429 Cardiomyopathy, unspecified: ICD-10-CM

## 2017-09-21 DIAGNOSIS — J479 Bronchiectasis, uncomplicated: ICD-10-CM

## 2017-09-21 DIAGNOSIS — Z9989 Dependence on other enabling machines and devices: ICD-10-CM

## 2017-09-21 DIAGNOSIS — Z6841 Body Mass Index (BMI) 40.0 and over, adult: ICD-10-CM

## 2017-09-27 ENCOUNTER — Encounter: Admit: 2017-09-27 | Discharge: 2017-09-27 | Payer: MEDICARE

## 2017-10-02 ENCOUNTER — Encounter: Admit: 2017-10-02 | Discharge: 2017-10-02 | Payer: MEDICARE

## 2017-10-02 ENCOUNTER — Ambulatory Visit: Admit: 2017-10-02 | Discharge: 2017-10-02 | Payer: MEDICARE

## 2017-10-02 DIAGNOSIS — Z6841 Body Mass Index (BMI) 40.0 and over, adult: ICD-10-CM

## 2017-10-02 DIAGNOSIS — I5042 Chronic combined systolic (congestive) and diastolic (congestive) heart failure: ICD-10-CM

## 2017-10-02 DIAGNOSIS — M25532 Pain in left wrist: Principal | ICD-10-CM

## 2017-10-02 DIAGNOSIS — M954 Acquired deformity of chest and rib: ICD-10-CM

## 2017-10-02 DIAGNOSIS — I714 Abdominal aortic aneurysm, without rupture: ICD-10-CM

## 2017-10-02 DIAGNOSIS — Z8614 Personal history of Methicillin resistant Staphylococcus aureus infection: ICD-10-CM

## 2017-10-02 DIAGNOSIS — J302 Other seasonal allergic rhinitis: ICD-10-CM

## 2017-10-02 DIAGNOSIS — N183 Chronic kidney disease, stage 3 (moderate): ICD-10-CM

## 2017-10-02 DIAGNOSIS — I509 Heart failure, unspecified: Principal | ICD-10-CM

## 2017-10-02 DIAGNOSIS — Z8679 Personal history of other diseases of the circulatory system: ICD-10-CM

## 2017-10-02 DIAGNOSIS — M1A0320 Idiopathic chronic gout, left wrist, without tophus (tophi): ICD-10-CM

## 2017-10-02 DIAGNOSIS — H109 Unspecified conjunctivitis: ICD-10-CM

## 2017-10-02 DIAGNOSIS — Z9981 Dependence on supplemental oxygen: ICD-10-CM

## 2017-10-02 DIAGNOSIS — J310 Chronic rhinitis: ICD-10-CM

## 2017-10-02 DIAGNOSIS — Z95828 Presence of other vascular implants and grafts: ICD-10-CM

## 2017-10-02 DIAGNOSIS — J45909 Unspecified asthma, uncomplicated: ICD-10-CM

## 2017-10-02 DIAGNOSIS — J479 Bronchiectasis, uncomplicated: ICD-10-CM

## 2017-10-02 DIAGNOSIS — J449 Chronic obstructive pulmonary disease, unspecified: ICD-10-CM

## 2017-10-02 DIAGNOSIS — M21372 Foot drop, left foot: ICD-10-CM

## 2017-10-02 DIAGNOSIS — E782 Mixed hyperlipidemia: ICD-10-CM

## 2017-10-02 DIAGNOSIS — J4550 Severe persistent asthma, uncomplicated: ICD-10-CM

## 2017-10-02 DIAGNOSIS — G4733 Obstructive sleep apnea (adult) (pediatric): ICD-10-CM

## 2017-10-02 DIAGNOSIS — I2510 Atherosclerotic heart disease of native coronary artery without angina pectoris: ICD-10-CM

## 2017-10-02 DIAGNOSIS — I429 Cardiomyopathy, unspecified: ICD-10-CM

## 2017-10-02 DIAGNOSIS — M961 Postlaminectomy syndrome, not elsewhere classified: ICD-10-CM

## 2017-10-02 DIAGNOSIS — R05 Cough: ICD-10-CM

## 2017-10-02 DIAGNOSIS — L03116 Cellulitis of left lower limb: ICD-10-CM

## 2017-10-02 DIAGNOSIS — R0600 Dyspnea, unspecified: ICD-10-CM

## 2017-10-02 DIAGNOSIS — I10 Essential (primary) hypertension: ICD-10-CM

## 2017-10-02 DIAGNOSIS — R609 Edema, unspecified: ICD-10-CM

## 2017-10-02 DIAGNOSIS — J189 Pneumonia, unspecified organism: ICD-10-CM

## 2017-10-02 DIAGNOSIS — K219 Gastro-esophageal reflux disease without esophagitis: ICD-10-CM

## 2017-10-02 MED ORDER — ALBUTEROL SULFATE 2.5 MG /3 ML (0.083 %) IN NEBU
3 mL | RESPIRATORY_TRACT | 11 refills | Status: AC | PRN
Start: 2017-10-02 — End: 2019-01-09

## 2017-10-02 MED ORDER — BUDESONIDE-FORMOTEROL 160-4.5 MCG/ACTUATION IN HFAA
2 | Freq: Two times a day (BID) | RESPIRATORY_TRACT | 3 refills | 30.00000 days | Status: AC
Start: 2017-10-02 — End: 2019-01-09

## 2017-10-02 MED ORDER — FLUNISOLIDE 25 MCG (0.025 %) NA SPRY
2 | Freq: Two times a day (BID) | NASAL | 3 refills | Status: SS
Start: 2017-10-02 — End: 2018-03-01

## 2017-10-02 MED ORDER — MONTELUKAST 10 MG PO TAB
10 mg | ORAL_TABLET | Freq: Every evening | ORAL | 3 refills | 90.00000 days | Status: AC
Start: 2017-10-02 — End: 2018-10-21

## 2017-10-02 MED ORDER — IPRATROPIUM BROMIDE 42 MCG (0.06 %) NA SPRY
12 refills | 25.00000 days | Status: AC | PRN
Start: 2017-10-02 — End: 2019-01-09

## 2017-10-02 MED ORDER — LIRAGLUTIDE 0.6 MG/0.1 ML (18 MG/3 ML) SC PNIJ
0.6 mg | INJECTION | Freq: Every day | SUBCUTANEOUS | 3 refills | Status: AC
Start: 2017-10-02 — End: 2018-02-15

## 2017-10-03 ENCOUNTER — Encounter: Admit: 2017-10-03 | Discharge: 2017-10-03 | Payer: MEDICARE

## 2017-10-03 DIAGNOSIS — M25532 Pain in left wrist: Principal | ICD-10-CM

## 2017-10-07 ENCOUNTER — Encounter: Admit: 2017-10-07 | Discharge: 2017-10-07 | Payer: MEDICARE

## 2017-10-07 DIAGNOSIS — M961 Postlaminectomy syndrome, not elsewhere classified: ICD-10-CM

## 2017-10-07 DIAGNOSIS — G4733 Obstructive sleep apnea (adult) (pediatric): ICD-10-CM

## 2017-10-07 DIAGNOSIS — J449 Chronic obstructive pulmonary disease, unspecified: ICD-10-CM

## 2017-10-07 DIAGNOSIS — I10 Essential (primary) hypertension: ICD-10-CM

## 2017-10-07 DIAGNOSIS — I5042 Chronic combined systolic (congestive) and diastolic (congestive) heart failure: ICD-10-CM

## 2017-10-07 DIAGNOSIS — I509 Heart failure, unspecified: Principal | ICD-10-CM

## 2017-10-07 DIAGNOSIS — J45909 Unspecified asthma, uncomplicated: ICD-10-CM

## 2017-10-07 DIAGNOSIS — R609 Edema, unspecified: ICD-10-CM

## 2017-10-07 DIAGNOSIS — Z9981 Dependence on supplemental oxygen: ICD-10-CM

## 2017-10-07 DIAGNOSIS — J189 Pneumonia, unspecified organism: ICD-10-CM

## 2017-10-07 DIAGNOSIS — R0600 Dyspnea, unspecified: ICD-10-CM

## 2017-10-07 DIAGNOSIS — Z95828 Presence of other vascular implants and grafts: ICD-10-CM

## 2017-10-07 DIAGNOSIS — N183 Chronic kidney disease, stage 3 (moderate): ICD-10-CM

## 2017-10-07 DIAGNOSIS — J479 Bronchiectasis, uncomplicated: ICD-10-CM

## 2017-10-07 DIAGNOSIS — M954 Acquired deformity of chest and rib: ICD-10-CM

## 2017-10-07 DIAGNOSIS — Z8679 Personal history of other diseases of the circulatory system: ICD-10-CM

## 2017-10-07 DIAGNOSIS — I429 Cardiomyopathy, unspecified: ICD-10-CM

## 2017-10-07 DIAGNOSIS — K219 Gastro-esophageal reflux disease without esophagitis: ICD-10-CM

## 2017-10-07 DIAGNOSIS — R05 Cough: ICD-10-CM

## 2017-10-07 DIAGNOSIS — Z8614 Personal history of Methicillin resistant Staphylococcus aureus infection: ICD-10-CM

## 2017-10-07 DIAGNOSIS — I2510 Atherosclerotic heart disease of native coronary artery without angina pectoris: Secondary | ICD-10-CM

## 2017-10-07 DIAGNOSIS — L03116 Cellulitis of left lower limb: ICD-10-CM

## 2017-10-07 DIAGNOSIS — I714 Abdominal aortic aneurysm, without rupture: ICD-10-CM

## 2017-10-07 DIAGNOSIS — E782 Mixed hyperlipidemia: ICD-10-CM

## 2017-10-07 DIAGNOSIS — J302 Other seasonal allergic rhinitis: ICD-10-CM

## 2017-10-08 ENCOUNTER — Encounter: Admit: 2017-10-08 | Discharge: 2017-10-08 | Payer: MEDICARE

## 2017-10-08 DIAGNOSIS — R0602 Shortness of breath: ICD-10-CM

## 2017-10-08 DIAGNOSIS — R062 Wheezing: ICD-10-CM

## 2017-10-08 DIAGNOSIS — I5042 Chronic combined systolic (congestive) and diastolic (congestive) heart failure: ICD-10-CM

## 2017-10-08 DIAGNOSIS — J4540 Moderate persistent asthma, uncomplicated: Principal | ICD-10-CM

## 2017-10-08 DIAGNOSIS — R5383 Other fatigue: ICD-10-CM

## 2017-10-08 DIAGNOSIS — J470 Bronchiectasis with acute lower respiratory infection: ICD-10-CM

## 2017-10-08 DIAGNOSIS — R05 Cough: ICD-10-CM

## 2017-10-08 MED ORDER — PREDNISONE 10 MG PO TAB
20 mg | ORAL_TABLET | Freq: Every day | ORAL | 0 refills | Status: AC
Start: 2017-10-08 — End: ?

## 2017-10-08 MED ORDER — DOXYCYCLINE HYCLATE 100 MG PO TAB
100 mg | ORAL_TABLET | Freq: Two times a day (BID) | ORAL | 0 refills | 8.00000 days | Status: AC
Start: 2017-10-08 — End: ?

## 2017-10-09 MED ORDER — PROMETHAZINE-CODEINE 6.25-10 MG/5 ML PO SYRP
5 mL | ORAL | 0 refills | 3.00000 days | Status: AC | PRN
Start: 2017-10-09 — End: 2018-01-08

## 2017-10-11 ENCOUNTER — Ambulatory Visit: Admit: 2017-10-11 | Discharge: 2017-10-12 | Payer: MEDICARE

## 2017-10-12 DIAGNOSIS — I5042 Chronic combined systolic (congestive) and diastolic (congestive) heart failure: Principal | ICD-10-CM

## 2017-10-15 ENCOUNTER — Encounter: Admit: 2017-10-15 | Discharge: 2017-10-15 | Payer: MEDICARE

## 2017-10-24 ENCOUNTER — Encounter: Admit: 2017-10-24 | Discharge: 2017-10-24 | Payer: MEDICARE

## 2017-10-25 ENCOUNTER — Ambulatory Visit: Admit: 2017-10-25 | Discharge: 2017-10-26 | Payer: MEDICARE

## 2017-10-25 ENCOUNTER — Encounter: Admit: 2017-10-25 | Discharge: 2017-10-25 | Payer: MEDICARE

## 2017-10-25 DIAGNOSIS — Z8614 Personal history of Methicillin resistant Staphylococcus aureus infection: ICD-10-CM

## 2017-10-25 DIAGNOSIS — K219 Gastro-esophageal reflux disease without esophagitis: ICD-10-CM

## 2017-10-25 DIAGNOSIS — M961 Postlaminectomy syndrome, not elsewhere classified: ICD-10-CM

## 2017-10-25 DIAGNOSIS — J189 Pneumonia, unspecified organism: ICD-10-CM

## 2017-10-25 DIAGNOSIS — I429 Cardiomyopathy, unspecified: ICD-10-CM

## 2017-10-25 DIAGNOSIS — R0600 Dyspnea, unspecified: ICD-10-CM

## 2017-10-25 DIAGNOSIS — I2510 Atherosclerotic heart disease of native coronary artery without angina pectoris: ICD-10-CM

## 2017-10-25 DIAGNOSIS — M954 Acquired deformity of chest and rib: ICD-10-CM

## 2017-10-25 DIAGNOSIS — N183 Chronic kidney disease, stage 3 (moderate): ICD-10-CM

## 2017-10-25 DIAGNOSIS — R05 Cough: ICD-10-CM

## 2017-10-25 DIAGNOSIS — I714 Abdominal aortic aneurysm, without rupture: ICD-10-CM

## 2017-10-25 DIAGNOSIS — I509 Heart failure, unspecified: Principal | ICD-10-CM

## 2017-10-25 DIAGNOSIS — J45909 Unspecified asthma, uncomplicated: ICD-10-CM

## 2017-10-25 DIAGNOSIS — I10 Essential (primary) hypertension: ICD-10-CM

## 2017-10-25 DIAGNOSIS — Z8679 Personal history of other diseases of the circulatory system: ICD-10-CM

## 2017-10-25 DIAGNOSIS — Z95828 Presence of other vascular implants and grafts: ICD-10-CM

## 2017-10-25 DIAGNOSIS — L03116 Cellulitis of left lower limb: ICD-10-CM

## 2017-10-25 DIAGNOSIS — J302 Other seasonal allergic rhinitis: ICD-10-CM

## 2017-10-25 DIAGNOSIS — I5042 Chronic combined systolic (congestive) and diastolic (congestive) heart failure: Secondary | ICD-10-CM

## 2017-10-25 DIAGNOSIS — G4733 Obstructive sleep apnea (adult) (pediatric): ICD-10-CM

## 2017-10-25 DIAGNOSIS — Z9981 Dependence on supplemental oxygen: ICD-10-CM

## 2017-10-25 DIAGNOSIS — J449 Chronic obstructive pulmonary disease, unspecified: ICD-10-CM

## 2017-10-25 DIAGNOSIS — R609 Edema, unspecified: ICD-10-CM

## 2017-10-25 DIAGNOSIS — E782 Mixed hyperlipidemia: ICD-10-CM

## 2017-10-25 DIAGNOSIS — J479 Bronchiectasis, uncomplicated: ICD-10-CM

## 2017-10-26 ENCOUNTER — Encounter: Admit: 2017-10-26 | Discharge: 2017-10-26 | Payer: MEDICARE

## 2017-10-26 DIAGNOSIS — I10 Essential (primary) hypertension: Secondary | ICD-10-CM

## 2017-10-26 DIAGNOSIS — Z959 Presence of cardiac and vascular implant and graft, unspecified: ICD-10-CM

## 2017-10-26 DIAGNOSIS — I110 Hypertensive heart disease with heart failure: ICD-10-CM

## 2017-10-26 DIAGNOSIS — I714 Abdominal aortic aneurysm, without rupture: Principal | ICD-10-CM

## 2017-10-26 DIAGNOSIS — J449 Chronic obstructive pulmonary disease, unspecified: ICD-10-CM

## 2017-10-26 DIAGNOSIS — Z95810 Presence of automatic (implantable) cardiac defibrillator: ICD-10-CM

## 2017-10-26 DIAGNOSIS — I2510 Atherosclerotic heart disease of native coronary artery without angina pectoris: ICD-10-CM

## 2017-10-26 DIAGNOSIS — D801 Nonfamilial hypogammaglobulinemia: ICD-10-CM

## 2017-10-26 DIAGNOSIS — Z95828 Presence of other vascular implants and grafts: ICD-10-CM

## 2017-10-26 DIAGNOSIS — Z789 Other specified health status: Secondary | ICD-10-CM

## 2017-10-26 DIAGNOSIS — I255 Ischemic cardiomyopathy: Secondary | ICD-10-CM

## 2017-10-26 DIAGNOSIS — M954 Acquired deformity of chest and rib: Secondary | ICD-10-CM

## 2017-10-26 DIAGNOSIS — M533 Sacrococcygeal disorders, not elsewhere classified: ICD-10-CM

## 2017-10-26 DIAGNOSIS — N183 Chronic kidney disease, stage 3 (moderate): ICD-10-CM

## 2017-10-26 DIAGNOSIS — Z6841 Body Mass Index (BMI) 40.0 and over, adult: ICD-10-CM

## 2017-10-26 DIAGNOSIS — J984 Other disorders of lung: ICD-10-CM

## 2017-10-26 DIAGNOSIS — I2582 Chronic total occlusion of coronary artery: ICD-10-CM

## 2017-10-26 MED ORDER — PANTOPRAZOLE 40 MG PO TBEC
ORAL_TABLET | Freq: Two times a day (BID) | ORAL | 0 refills | 90.00000 days | Status: AC
Start: 2017-10-26 — End: 2017-12-17

## 2017-10-28 ENCOUNTER — Encounter: Admit: 2017-10-28 | Discharge: 2017-10-28 | Payer: MEDICARE

## 2017-10-28 DIAGNOSIS — J189 Pneumonia, unspecified organism: ICD-10-CM

## 2017-10-28 DIAGNOSIS — J449 Chronic obstructive pulmonary disease, unspecified: ICD-10-CM

## 2017-10-28 DIAGNOSIS — K219 Gastro-esophageal reflux disease without esophagitis: ICD-10-CM

## 2017-10-28 DIAGNOSIS — I714 Abdominal aortic aneurysm, without rupture: ICD-10-CM

## 2017-10-28 DIAGNOSIS — Z95828 Presence of other vascular implants and grafts: ICD-10-CM

## 2017-10-28 DIAGNOSIS — J302 Other seasonal allergic rhinitis: ICD-10-CM

## 2017-10-28 DIAGNOSIS — R0600 Dyspnea, unspecified: ICD-10-CM

## 2017-10-28 DIAGNOSIS — I2510 Atherosclerotic heart disease of native coronary artery without angina pectoris: ICD-10-CM

## 2017-10-28 DIAGNOSIS — N183 Chronic kidney disease, stage 3 (moderate): ICD-10-CM

## 2017-10-28 DIAGNOSIS — Z8679 Personal history of other diseases of the circulatory system: ICD-10-CM

## 2017-10-28 DIAGNOSIS — R05 Cough: ICD-10-CM

## 2017-10-28 DIAGNOSIS — E782 Mixed hyperlipidemia: ICD-10-CM

## 2017-10-28 DIAGNOSIS — R609 Edema, unspecified: ICD-10-CM

## 2017-10-28 DIAGNOSIS — Z9981 Dependence on supplemental oxygen: ICD-10-CM

## 2017-10-28 DIAGNOSIS — I509 Heart failure, unspecified: Principal | ICD-10-CM

## 2017-10-28 DIAGNOSIS — I429 Cardiomyopathy, unspecified: ICD-10-CM

## 2017-10-28 DIAGNOSIS — M954 Acquired deformity of chest and rib: ICD-10-CM

## 2017-10-28 DIAGNOSIS — N401 Enlarged prostate with lower urinary tract symptoms: ICD-10-CM

## 2017-10-28 DIAGNOSIS — L03116 Cellulitis of left lower limb: ICD-10-CM

## 2017-10-28 DIAGNOSIS — M961 Postlaminectomy syndrome, not elsewhere classified: ICD-10-CM

## 2017-10-28 DIAGNOSIS — J45909 Unspecified asthma, uncomplicated: ICD-10-CM

## 2017-10-28 DIAGNOSIS — Z8614 Personal history of Methicillin resistant Staphylococcus aureus infection: ICD-10-CM

## 2017-10-28 DIAGNOSIS — G4733 Obstructive sleep apnea (adult) (pediatric): ICD-10-CM

## 2017-10-28 DIAGNOSIS — I10 Essential (primary) hypertension: ICD-10-CM

## 2017-10-28 DIAGNOSIS — I5042 Chronic combined systolic (congestive) and diastolic (congestive) heart failure: ICD-10-CM

## 2017-10-28 DIAGNOSIS — J479 Bronchiectasis, uncomplicated: ICD-10-CM

## 2017-10-29 ENCOUNTER — Encounter: Admit: 2017-10-29 | Discharge: 2017-10-29 | Payer: MEDICARE

## 2017-10-29 DIAGNOSIS — I5042 Chronic combined systolic (congestive) and diastolic (congestive) heart failure: ICD-10-CM

## 2017-10-29 DIAGNOSIS — I10 Essential (primary) hypertension: ICD-10-CM

## 2017-10-29 DIAGNOSIS — N183 Chronic kidney disease, stage 3 (moderate): ICD-10-CM

## 2017-10-29 DIAGNOSIS — Z8614 Personal history of Methicillin resistant Staphylococcus aureus infection: ICD-10-CM

## 2017-10-29 DIAGNOSIS — R0600 Dyspnea, unspecified: ICD-10-CM

## 2017-10-29 DIAGNOSIS — J479 Bronchiectasis, uncomplicated: ICD-10-CM

## 2017-10-29 DIAGNOSIS — Z95828 Presence of other vascular implants and grafts: ICD-10-CM

## 2017-10-29 DIAGNOSIS — I714 Abdominal aortic aneurysm, without rupture: ICD-10-CM

## 2017-10-29 DIAGNOSIS — J449 Chronic obstructive pulmonary disease, unspecified: ICD-10-CM

## 2017-10-29 DIAGNOSIS — I2510 Atherosclerotic heart disease of native coronary artery without angina pectoris: ICD-10-CM

## 2017-10-29 DIAGNOSIS — M961 Postlaminectomy syndrome, not elsewhere classified: ICD-10-CM

## 2017-10-29 DIAGNOSIS — I509 Heart failure, unspecified: Principal | ICD-10-CM

## 2017-10-29 DIAGNOSIS — I429 Cardiomyopathy, unspecified: ICD-10-CM

## 2017-10-29 DIAGNOSIS — G4733 Obstructive sleep apnea (adult) (pediatric): ICD-10-CM

## 2017-10-29 DIAGNOSIS — Z9981 Dependence on supplemental oxygen: ICD-10-CM

## 2017-10-29 DIAGNOSIS — N401 Enlarged prostate with lower urinary tract symptoms: ICD-10-CM

## 2017-10-29 DIAGNOSIS — J189 Pneumonia, unspecified organism: ICD-10-CM

## 2017-10-29 DIAGNOSIS — E782 Mixed hyperlipidemia: ICD-10-CM

## 2017-10-29 DIAGNOSIS — R609 Edema, unspecified: ICD-10-CM

## 2017-10-29 DIAGNOSIS — Z8679 Personal history of other diseases of the circulatory system: ICD-10-CM

## 2017-10-29 DIAGNOSIS — K219 Gastro-esophageal reflux disease without esophagitis: ICD-10-CM

## 2017-10-29 DIAGNOSIS — R05 Cough: ICD-10-CM

## 2017-10-29 DIAGNOSIS — J45909 Unspecified asthma, uncomplicated: ICD-10-CM

## 2017-10-29 DIAGNOSIS — J302 Other seasonal allergic rhinitis: ICD-10-CM

## 2017-10-29 DIAGNOSIS — L03116 Cellulitis of left lower limb: ICD-10-CM

## 2017-10-29 DIAGNOSIS — M954 Acquired deformity of chest and rib: ICD-10-CM

## 2017-10-29 MED ORDER — MYRBETRIQ 50 MG PO TB24
50 mg | ORAL_TABLET | Freq: Every day | ORAL | 3 refills | Status: SS
Start: 2017-10-29 — End: 2018-03-01

## 2017-10-30 ENCOUNTER — Ambulatory Visit: Admit: 2017-10-29 | Discharge: 2017-10-30 | Payer: MEDICARE

## 2017-10-30 ENCOUNTER — Encounter: Admit: 2017-10-30 | Discharge: 2017-10-30 | Payer: MEDICARE

## 2017-10-30 DIAGNOSIS — N3281 Overactive bladder: ICD-10-CM

## 2017-10-30 DIAGNOSIS — N3944 Nocturnal enuresis: ICD-10-CM

## 2017-10-30 DIAGNOSIS — N138 Other obstructive and reflux uropathy: ICD-10-CM

## 2017-10-30 DIAGNOSIS — R972 Elevated prostate specific antigen [PSA]: Principal | ICD-10-CM

## 2017-10-30 DIAGNOSIS — R350 Frequency of micturition: ICD-10-CM

## 2017-10-30 DIAGNOSIS — N4231 Prostatic intraepithelial neoplasia: ICD-10-CM

## 2017-10-30 DIAGNOSIS — R351 Nocturia: ICD-10-CM

## 2017-10-30 DIAGNOSIS — R3915 Urgency of urination: ICD-10-CM

## 2017-10-30 DIAGNOSIS — N3941 Urge incontinence: ICD-10-CM

## 2017-10-30 DIAGNOSIS — K5909 Other constipation: ICD-10-CM

## 2017-10-30 MED ORDER — TRAMADOL 50 MG PO TAB
50-100 mg | ORAL_TABLET | ORAL | 0 refills | Status: AC | PRN
Start: 2017-10-30 — End: 2018-01-08

## 2017-11-07 ENCOUNTER — Ambulatory Visit: Admit: 2017-11-07 | Discharge: 2017-11-07 | Payer: MEDICARE

## 2017-11-07 ENCOUNTER — Encounter: Admit: 2017-11-07 | Discharge: 2017-11-19 | Payer: MEDICARE

## 2017-11-07 ENCOUNTER — Encounter: Admit: 2017-11-07 | Discharge: 2017-11-07 | Payer: MEDICARE

## 2017-11-07 ENCOUNTER — Ambulatory Visit: Admit: 2017-11-07 | Discharge: 2017-11-08 | Payer: MEDICARE

## 2017-11-07 DIAGNOSIS — E782 Mixed hyperlipidemia: ICD-10-CM

## 2017-11-07 DIAGNOSIS — M954 Acquired deformity of chest and rib: ICD-10-CM

## 2017-11-07 DIAGNOSIS — Z8614 Personal history of Methicillin resistant Staphylococcus aureus infection: ICD-10-CM

## 2017-11-07 DIAGNOSIS — N183 Chronic kidney disease, stage 3 (moderate): ICD-10-CM

## 2017-11-07 DIAGNOSIS — I10 Essential (primary) hypertension: ICD-10-CM

## 2017-11-07 DIAGNOSIS — Z8679 Personal history of other diseases of the circulatory system: ICD-10-CM

## 2017-11-07 DIAGNOSIS — R609 Edema, unspecified: ICD-10-CM

## 2017-11-07 DIAGNOSIS — I2510 Atherosclerotic heart disease of native coronary artery without angina pectoris: Secondary | ICD-10-CM

## 2017-11-07 DIAGNOSIS — Z95828 Presence of other vascular implants and grafts: ICD-10-CM

## 2017-11-07 DIAGNOSIS — K219 Gastro-esophageal reflux disease without esophagitis: ICD-10-CM

## 2017-11-07 DIAGNOSIS — I429 Cardiomyopathy, unspecified: ICD-10-CM

## 2017-11-07 DIAGNOSIS — N401 Enlarged prostate with lower urinary tract symptoms: ICD-10-CM

## 2017-11-07 DIAGNOSIS — J449 Chronic obstructive pulmonary disease, unspecified: ICD-10-CM

## 2017-11-07 DIAGNOSIS — G4733 Obstructive sleep apnea (adult) (pediatric): ICD-10-CM

## 2017-11-07 DIAGNOSIS — M961 Postlaminectomy syndrome, not elsewhere classified: ICD-10-CM

## 2017-11-07 DIAGNOSIS — J302 Other seasonal allergic rhinitis: ICD-10-CM

## 2017-11-07 DIAGNOSIS — I509 Heart failure, unspecified: Principal | ICD-10-CM

## 2017-11-07 DIAGNOSIS — L03116 Cellulitis of left lower limb: ICD-10-CM

## 2017-11-07 DIAGNOSIS — I5042 Chronic combined systolic (congestive) and diastolic (congestive) heart failure: ICD-10-CM

## 2017-11-07 DIAGNOSIS — I714 Abdominal aortic aneurysm, without rupture: ICD-10-CM

## 2017-11-07 DIAGNOSIS — R0600 Dyspnea, unspecified: ICD-10-CM

## 2017-11-07 DIAGNOSIS — J189 Pneumonia, unspecified organism: ICD-10-CM

## 2017-11-07 DIAGNOSIS — Z9981 Dependence on supplemental oxygen: ICD-10-CM

## 2017-11-07 DIAGNOSIS — J479 Bronchiectasis, uncomplicated: ICD-10-CM

## 2017-11-07 DIAGNOSIS — J45909 Unspecified asthma, uncomplicated: ICD-10-CM

## 2017-11-07 DIAGNOSIS — G5603 Carpal tunnel syndrome, bilateral upper limbs: Secondary | ICD-10-CM

## 2017-11-07 DIAGNOSIS — R05 Cough: ICD-10-CM

## 2017-11-09 ENCOUNTER — Encounter: Admit: 2017-11-09 | Discharge: 2017-11-09 | Payer: MEDICARE

## 2017-11-09 MED ORDER — BUMETANIDE 2 MG PO TAB
ORAL_TABLET | Freq: Two times a day (BID) | 2 refills | Status: AC
Start: 2017-11-09 — End: 2017-11-19

## 2017-11-12 ENCOUNTER — Ambulatory Visit: Admit: 2017-11-12 | Discharge: 2017-11-13 | Payer: MEDICARE

## 2017-11-13 DIAGNOSIS — I5042 Chronic combined systolic (congestive) and diastolic (congestive) heart failure: Principal | ICD-10-CM

## 2017-11-14 ENCOUNTER — Encounter: Admit: 2017-11-14 | Discharge: 2017-11-14 | Payer: MEDICARE

## 2017-11-14 ENCOUNTER — Inpatient Hospital Stay: Admit: 2017-11-14 | Discharge: 2017-11-14 | Payer: MEDICARE

## 2017-11-14 ENCOUNTER — Ambulatory Visit: Admit: 2017-11-14 | Discharge: 2017-11-14 | Payer: MEDICARE

## 2017-11-14 ENCOUNTER — Encounter: Admit: 2017-11-15 | Discharge: 2017-11-15 | Payer: MEDICARE

## 2017-11-14 ENCOUNTER — Encounter: Admit: 2017-11-14 | Discharge: 2017-11-15 | Payer: MEDICARE

## 2017-11-14 DIAGNOSIS — I714 Abdominal aortic aneurysm, without rupture: ICD-10-CM

## 2017-11-14 DIAGNOSIS — Z9981 Dependence on supplemental oxygen: ICD-10-CM

## 2017-11-14 DIAGNOSIS — J302 Other seasonal allergic rhinitis: ICD-10-CM

## 2017-11-14 DIAGNOSIS — T1490XA Injury, unspecified, initial encounter: ICD-10-CM

## 2017-11-14 DIAGNOSIS — I5022 Chronic systolic (congestive) heart failure: ICD-10-CM

## 2017-11-14 DIAGNOSIS — R69 Illness, unspecified: Principal | ICD-10-CM

## 2017-11-14 DIAGNOSIS — I10 Essential (primary) hypertension: ICD-10-CM

## 2017-11-14 DIAGNOSIS — M961 Postlaminectomy syndrome, not elsewhere classified: ICD-10-CM

## 2017-11-14 DIAGNOSIS — R05 Cough: ICD-10-CM

## 2017-11-14 DIAGNOSIS — J479 Bronchiectasis, uncomplicated: ICD-10-CM

## 2017-11-14 DIAGNOSIS — I509 Heart failure, unspecified: Principal | ICD-10-CM

## 2017-11-14 DIAGNOSIS — G4733 Obstructive sleep apnea (adult) (pediatric): ICD-10-CM

## 2017-11-14 DIAGNOSIS — Z95828 Presence of other vascular implants and grafts: ICD-10-CM

## 2017-11-14 DIAGNOSIS — K219 Gastro-esophageal reflux disease without esophagitis: ICD-10-CM

## 2017-11-14 DIAGNOSIS — M954 Acquired deformity of chest and rib: ICD-10-CM

## 2017-11-14 DIAGNOSIS — I255 Ischemic cardiomyopathy: ICD-10-CM

## 2017-11-14 DIAGNOSIS — I5042 Chronic combined systolic (congestive) and diastolic (congestive) heart failure: ICD-10-CM

## 2017-11-14 DIAGNOSIS — Z8679 Personal history of other diseases of the circulatory system: ICD-10-CM

## 2017-11-14 DIAGNOSIS — E782 Mixed hyperlipidemia: ICD-10-CM

## 2017-11-14 DIAGNOSIS — R609 Edema, unspecified: ICD-10-CM

## 2017-11-14 DIAGNOSIS — N401 Enlarged prostate with lower urinary tract symptoms: ICD-10-CM

## 2017-11-14 DIAGNOSIS — J189 Pneumonia, unspecified organism: ICD-10-CM

## 2017-11-14 DIAGNOSIS — I429 Cardiomyopathy, unspecified: ICD-10-CM

## 2017-11-14 DIAGNOSIS — J449 Chronic obstructive pulmonary disease, unspecified: ICD-10-CM

## 2017-11-14 DIAGNOSIS — Z7409 Other reduced mobility: ICD-10-CM

## 2017-11-14 DIAGNOSIS — J45909 Unspecified asthma, uncomplicated: ICD-10-CM

## 2017-11-14 DIAGNOSIS — N183 Chronic kidney disease, stage 3 (moderate): ICD-10-CM

## 2017-11-14 DIAGNOSIS — R0600 Dyspnea, unspecified: ICD-10-CM

## 2017-11-14 DIAGNOSIS — L03116 Cellulitis of left lower limb: ICD-10-CM

## 2017-11-14 DIAGNOSIS — Z8614 Personal history of Methicillin resistant Staphylococcus aureus infection: ICD-10-CM

## 2017-11-14 DIAGNOSIS — I2510 Atherosclerotic heart disease of native coronary artery without angina pectoris: ICD-10-CM

## 2017-11-14 MED ORDER — DULOXETINE 60 MG PO CPDR
60 mg | Freq: Every day | ORAL | 0 refills | Status: DC
Start: 2017-11-14 — End: 2017-11-19
  Administered 2017-11-15 – 2017-11-19 (×5): 60 mg via ORAL

## 2017-11-14 MED ORDER — IPRATROPIUM BROMIDE 42 MCG (0.06 %) NA SPRY
1 | NASAL | 0 refills | Status: DC | PRN
Start: 2017-11-14 — End: 2017-11-19

## 2017-11-14 MED ORDER — GUAIFENESIN 600 MG PO TA12
600 mg | Freq: Every day | ORAL | 0 refills | Status: DC
Start: 2017-11-14 — End: 2017-11-19
  Administered 2017-11-15 – 2017-11-19 (×5): 600 mg via ORAL

## 2017-11-14 MED ORDER — BUDESONIDE-FORMOTEROL 160-4.5 MCG/ACTUATION IN HFAA
2 | Freq: Two times a day (BID) | RESPIRATORY_TRACT | 0 refills | Status: DC
Start: 2017-11-14 — End: 2017-11-19
  Administered 2017-11-15: 14:00:00 2 via RESPIRATORY_TRACT

## 2017-11-14 MED ORDER — ATORVASTATIN 40 MG PO TAB
40 mg | Freq: Every day | ORAL | 0 refills | Status: DC
Start: 2017-11-14 — End: 2017-11-19
  Administered 2017-11-15 – 2017-11-19 (×5): 40 mg via ORAL

## 2017-11-14 MED ORDER — ALBUTEROL SULFATE 2.5 MG /3 ML (0.083 %) IN NEBU
2.5 mg | RESPIRATORY_TRACT | 0 refills | Status: DC | PRN
Start: 2017-11-14 — End: 2017-11-15

## 2017-11-14 MED ORDER — METOPROLOL SUCCINATE 25 MG PO TB24
25 mg | Freq: Every day | ORAL | 0 refills | Status: DC
Start: 2017-11-14 — End: 2017-11-15
  Administered 2017-11-15: 15:00:00 25 mg via ORAL

## 2017-11-14 MED ORDER — PANTOPRAZOLE 40 MG PO TBEC
40 mg | Freq: Every day | ORAL | 0 refills | Status: DC
Start: 2017-11-14 — End: 2017-11-19
  Administered 2017-11-16 – 2017-11-18 (×3): 40 mg via ORAL

## 2017-11-14 MED ORDER — MONTELUKAST 10 MG PO TAB
10 mg | Freq: Every evening | ORAL | 0 refills | Status: DC
Start: 2017-11-14 — End: 2017-11-19
  Administered 2017-11-16 – 2017-11-19 (×4): 10 mg via ORAL

## 2017-11-14 MED ORDER — GABAPENTIN 400 MG PO CAP
400 mg | ORAL | 0 refills | Status: DC
Start: 2017-11-14 — End: 2017-11-19
  Administered 2017-11-15 – 2017-11-19 (×14): 400 mg via ORAL

## 2017-11-14 MED ORDER — ALBUTEROL SULFATE 90 MCG/ACTUATION IN HFAA
2 | Freq: Two times a day (BID) | RESPIRATORY_TRACT | 0 refills | Status: DC | PRN
Start: 2017-11-14 — End: 2017-11-15

## 2017-11-15 ENCOUNTER — Encounter: Admit: 2017-11-15 | Discharge: 2017-11-15 | Payer: MEDICARE

## 2017-11-15 LAB — PHOSPHORUS: Lab: 4.4 mg/dL (ref <150)

## 2017-11-15 LAB — CBC: Lab: 8.5 K/UL — ABNORMAL LOW (ref 4.5–11.0)

## 2017-11-15 LAB — POC GLUCOSE
Lab: 149 mg/dL — ABNORMAL HIGH (ref 70–100)
Lab: 139 mg/dL — ABNORMAL HIGH (ref 70–100)

## 2017-11-15 LAB — MAGNESIUM: Lab: 1.7 mg/dL — ABNORMAL LOW (ref >60)

## 2017-11-15 LAB — BNP (B-TYPE NATRIURETIC PEPTI): Lab: 66 pg/mL (ref 0–100)

## 2017-11-15 LAB — BASIC METABOLIC PANEL: Lab: 133 MMOL/L — ABNORMAL LOW (ref 137–147)

## 2017-11-15 MED ORDER — MAGNESIUM SULFATE IN D5W 1 GRAM/100 ML IV PGBK
1 g | Freq: Once | INTRAVENOUS | 0 refills | Status: CP
Start: 2017-11-15 — End: ?
  Administered 2017-11-15: 20:00:00 1 g via INTRAVENOUS

## 2017-11-15 MED ORDER — INSULIN ASPART 100 UNIT/ML SC FLEXPEN
0-12 [IU] | Freq: Every day | SUBCUTANEOUS | 0 refills | Status: DC
Start: 2017-11-15 — End: 2017-11-18

## 2017-11-15 MED ORDER — IPRATROPIUM BROMIDE 0.02 % IN SOLN
0.5 mg | Freq: Four times a day (QID) | RESPIRATORY_TRACT | 0 refills | Status: DC | PRN
Start: 2017-11-15 — End: 2017-11-19
  Administered 2017-11-15 – 2017-11-19 (×19): 0.5 mg via RESPIRATORY_TRACT

## 2017-11-15 MED ORDER — CLOPIDOGREL 75 MG PO TAB
75 mg | Freq: Every day | ORAL | 0 refills | Status: DC
Start: 2017-11-15 — End: 2017-11-19
  Administered 2017-11-15 – 2017-11-19 (×5): 75 mg via ORAL

## 2017-11-15 MED ORDER — BENZONATATE 100 MG PO CAP
200 mg | ORAL | 0 refills | Status: DC | PRN
Start: 2017-11-15 — End: 2017-11-19
  Administered 2017-11-16 – 2017-11-19 (×6): 200 mg via ORAL

## 2017-11-15 MED ORDER — POLYETHYLENE GLYCOL 3350 17 GRAM PO PWPK
1 | Freq: Two times a day (BID) | ORAL | 0 refills | Status: DC
Start: 2017-11-15 — End: 2017-11-18
  Administered 2017-11-15 – 2017-11-18 (×6): 17 g via ORAL

## 2017-11-15 MED ORDER — ASPIRIN 81 MG PO TBEC
81 mg | Freq: Every day | ORAL | 0 refills | Status: DC
Start: 2017-11-15 — End: 2017-11-19
  Administered 2017-11-15 – 2017-11-19 (×5): 81 mg via ORAL

## 2017-11-15 MED ORDER — ENOXAPARIN 40 MG/0.4 ML SC SYRG
40 mg | Freq: Two times a day (BID) | SUBCUTANEOUS | 0 refills | Status: DC
Start: 2017-11-15 — End: 2017-11-19
  Administered 2017-11-15 – 2017-11-19 (×9): 40 mg via SUBCUTANEOUS

## 2017-11-15 MED ORDER — ACETAMINOPHEN 325 MG PO TAB
650 mg | ORAL | 0 refills | Status: DC
Start: 2017-11-15 — End: 2017-11-19
  Administered 2017-11-15 – 2017-11-19 (×16): 650 mg via ORAL

## 2017-11-15 MED ORDER — OXYCODONE 5 MG PO TAB
5-10 mg | ORAL | 0 refills | Status: DC | PRN
Start: 2017-11-15 — End: 2017-11-17
  Administered 2017-11-15: 06:00:00 5 mg via ORAL
  Administered 2017-11-15: 12:00:00 10 mg via ORAL
  Administered 2017-11-15: 07:00:00 5 mg via ORAL
  Administered 2017-11-15: 20:00:00 10 mg via ORAL
  Administered 2017-11-16: 11:00:00 5 mg via ORAL
  Administered 2017-11-16 – 2017-11-17 (×3): 10 mg via ORAL

## 2017-11-15 MED ORDER — BACITRACIN ZINC 500 UNIT/GRAM TP OINT
Freq: Two times a day (BID) | TOPICAL | 0 refills | Status: DC
Start: 2017-11-15 — End: 2017-11-19
  Administered 2017-11-16: 02:00:00 via TOPICAL

## 2017-11-15 MED ORDER — METHOCARBAMOL 500 MG PO TAB
500 mg | Freq: Two times a day (BID) | ORAL | 0 refills | Status: DC
Start: 2017-11-15 — End: 2017-11-17
  Administered 2017-11-15 – 2017-11-17 (×4): 500 mg via ORAL

## 2017-11-15 MED ORDER — ALBUTEROL SULFATE 90 MCG/ACTUATION IN HFAA
2 | RESPIRATORY_TRACT | 0 refills | Status: DC | PRN
Start: 2017-11-15 — End: 2017-11-15

## 2017-11-15 MED ORDER — SPIRONOLACTONE 25 MG PO TAB
25 mg | Freq: Every day | ORAL | 0 refills | Status: DC
Start: 2017-11-15 — End: 2017-11-19
  Administered 2017-11-16 – 2017-11-19 (×4): 25 mg via ORAL

## 2017-11-15 MED ORDER — BUMETANIDE 2 MG PO TAB
2 mg | Freq: Every day | ORAL | 0 refills | Status: DC
Start: 2017-11-15 — End: 2017-11-19
  Administered 2017-11-16 – 2017-11-19 (×4): 2 mg via ORAL

## 2017-11-15 MED ORDER — ALBUTEROL SULFATE 2.5 MG/0.5 ML IN NEBU
2.5 mg | Freq: Four times a day (QID) | RESPIRATORY_TRACT | 0 refills | Status: DC | PRN
Start: 2017-11-15 — End: 2017-11-19
  Administered 2017-11-15 – 2017-11-19 (×19): 2.5 mg via RESPIRATORY_TRACT

## 2017-11-15 MED ORDER — INHALATIONAL SPACING DEVICE MISC SPCR
0 refills | Status: DC
Start: 2017-11-15 — End: 2017-11-19

## 2017-11-15 MED ORDER — FLUTICASONE PROPIONATE 50 MCG/ACTUATION NA SPSN
2 | Freq: Every day | NASAL | 0 refills | Status: DC
Start: 2017-11-15 — End: 2017-11-19
  Administered 2017-11-16: 07:00:00 2 via NASAL

## 2017-11-15 MED ORDER — SENNOSIDES 8.6 MG PO TAB
1 | Freq: Two times a day (BID) | ORAL | 0 refills | Status: DC
Start: 2017-11-15 — End: 2017-11-18
  Administered 2017-11-15 – 2017-11-18 (×6): 1 via ORAL

## 2017-11-16 ENCOUNTER — Inpatient Hospital Stay: Admit: 2017-11-16 | Discharge: 2017-11-16 | Payer: MEDICARE

## 2017-11-16 DIAGNOSIS — S43004A Unspecified dislocation of right shoulder joint, initial encounter: Principal | ICD-10-CM

## 2017-11-16 LAB — PHOSPHORUS: Lab: 4.4 mg/dL — ABNORMAL HIGH (ref 2.0–4.5)

## 2017-11-16 LAB — POC GLUCOSE
Lab: 152 mg/dL — ABNORMAL HIGH (ref 70–100)
Lab: 140 mg/dL — ABNORMAL HIGH (ref 70–100)
Lab: 150 mg/dL — ABNORMAL HIGH (ref 70–100)
Lab: 152 mg/dL — ABNORMAL HIGH (ref 70–100)
Lab: 146 mg/dL — ABNORMAL HIGH (ref 70–100)

## 2017-11-16 LAB — MAGNESIUM: Lab: 2 mg/dL — ABNORMAL LOW (ref >60)

## 2017-11-16 LAB — CBC
Lab: 3.3 M/UL — ABNORMAL LOW (ref 4.4–5.5)
Lab: 7.5 10*3/uL — ABNORMAL HIGH (ref 4.5–11.0)

## 2017-11-16 LAB — BASIC METABOLIC PANEL: Lab: 132 MMOL/L — ABNORMAL LOW (ref 137–147)

## 2017-11-16 MED ORDER — PROMETHAZINE-CODEINE 6.25-10 MG/5 ML PO SYRP
10 mL | ORAL | 0 refills | Status: DC | PRN
Start: 2017-11-16 — End: 2017-11-19
  Administered 2017-11-16 – 2017-11-19 (×6): 10 mL via ORAL

## 2017-11-16 MED ORDER — SENNOSIDES 8.6 MG PO TAB
1 | ORAL_TABLET | Freq: Two times a day (BID) | ORAL | 3 refills | Status: CN
Start: 2017-11-16 — End: ?

## 2017-11-16 MED ORDER — GUAIFENESIN 600 MG PO TA12
600 mg | ORAL_TABLET | Freq: Every day | ORAL | 0 refills | Status: CN
Start: 2017-11-16 — End: ?

## 2017-11-16 MED ORDER — POLYETHYLENE GLYCOL 3350 17 GRAM PO PWPK
17 g | Freq: Two times a day (BID) | ORAL | 0 refills | Status: CN
Start: 2017-11-16 — End: ?

## 2017-11-16 MED ORDER — METHOCARBAMOL 500 MG PO TAB
500 mg | ORAL_TABLET | Freq: Two times a day (BID) | ORAL | 0 refills | Status: CN
Start: 2017-11-16 — End: ?

## 2017-11-16 MED ORDER — OXYCODONE 5 MG PO TAB
5-10 mg | ORAL_TABLET | ORAL | 0 refills | Status: CN | PRN
Start: 2017-11-16 — End: ?

## 2017-11-17 LAB — POC GLUCOSE
Lab: 164 mg/dL — ABNORMAL HIGH (ref 70–100)
Lab: 181 mg/dL — ABNORMAL HIGH (ref 70–100)
Lab: 126 mg/dL — ABNORMAL HIGH (ref 70–100)
Lab: 146 mg/dL — ABNORMAL HIGH (ref 70–100)
Lab: 152 mg/dL — ABNORMAL HIGH (ref 70–100)

## 2017-11-17 LAB — MAGNESIUM: Lab: 1.9 mg/dL — ABNORMAL LOW (ref 1.6–2.6)

## 2017-11-17 LAB — PHOSPHORUS: Lab: 3.7 mg/dL (ref >60)

## 2017-11-17 LAB — CBC: Lab: 6.8 K/UL — ABNORMAL LOW (ref 4.5–11.0)

## 2017-11-17 LAB — BASIC METABOLIC PANEL: Lab: 131 MMOL/L — ABNORMAL LOW (ref 137–147)

## 2017-11-17 MED ORDER — MAGNESIUM HYDROXIDE 2,400 MG/10 ML PO SUSP
20 mL | Freq: Two times a day (BID) | ORAL | 0 refills | Status: DC
Start: 2017-11-17 — End: 2017-11-19
  Administered 2017-11-17 – 2017-11-19 (×5): 20 mL via ORAL

## 2017-11-17 MED ORDER — OXYCODONE 5 MG PO TAB
5-15 mg | ORAL | 0 refills | Status: DC | PRN
Start: 2017-11-17 — End: 2017-11-19
  Administered 2017-11-17 – 2017-11-18 (×2): 5 mg via ORAL
  Administered 2017-11-18 (×3): 10 mg via ORAL
  Administered 2017-11-18: 01:00:00 15 mg via ORAL
  Administered 2017-11-19: 07:00:00 10 mg via ORAL

## 2017-11-17 MED ORDER — METHOCARBAMOL 750 MG PO TAB
750 mg | Freq: Three times a day (TID) | ORAL | 0 refills | Status: DC
Start: 2017-11-17 — End: 2017-11-18
  Administered 2017-11-17 – 2017-11-18 (×3): 750 mg via ORAL

## 2017-11-17 MED ORDER — BISACODYL 10 MG RE SUPP
10 mg | Freq: Two times a day (BID) | RECTAL | 0 refills | Status: DC
Start: 2017-11-17 — End: 2017-11-19
  Administered 2017-11-18 – 2017-11-19 (×2): 10 mg via RECTAL

## 2017-11-18 LAB — PHOSPHORUS: Lab: 2.6 mg/dL — ABNORMAL LOW (ref 2.0–4.5)

## 2017-11-18 LAB — BASIC METABOLIC PANEL: Lab: 133 MMOL/L — ABNORMAL LOW (ref 137–147)

## 2017-11-18 LAB — CBC: Lab: 7.7 K/UL — ABNORMAL LOW (ref 4.5–11.0)

## 2017-11-18 LAB — POC GLUCOSE
Lab: 191 mg/dL — ABNORMAL HIGH (ref 70–100)
Lab: 149 mg/dL — ABNORMAL HIGH (ref 70–100)
Lab: 161 mg/dL — ABNORMAL HIGH (ref 70–100)
Lab: 136 mg/dL — ABNORMAL HIGH (ref 70–100)

## 2017-11-18 LAB — MAGNESIUM: Lab: 2.4 mg/dL — ABNORMAL LOW (ref 1.6–2.6)

## 2017-11-18 MED ORDER — POLYETHYLENE GLYCOL 3350 17 GRAM PO PWPK
2 | Freq: Two times a day (BID) | ORAL | 0 refills | Status: DC
Start: 2017-11-18 — End: 2017-11-19
  Administered 2017-11-19 (×2): 34 g via ORAL

## 2017-11-18 MED ORDER — SENNOSIDES 8.6 MG PO TAB
2 | Freq: Two times a day (BID) | ORAL | 0 refills | Status: DC
Start: 2017-11-18 — End: 2017-11-19
  Administered 2017-11-18 – 2017-11-19 (×3): 2 via ORAL

## 2017-11-18 MED ORDER — CYCLOBENZAPRINE 10 MG PO TAB
10 mg | Freq: Three times a day (TID) | ORAL | 0 refills | Status: DC
Start: 2017-11-18 — End: 2017-11-19
  Administered 2017-11-18 – 2017-11-19 (×4): 10 mg via ORAL

## 2017-11-19 ENCOUNTER — Inpatient Hospital Stay: Admit: 2017-11-15 | Discharge: 2017-11-15 | Payer: MEDICARE

## 2017-11-19 ENCOUNTER — Inpatient Hospital Stay
Admit: 2017-11-15 | Discharge: 2017-11-19 | Disposition: A | Discharge status: Rehabilitation Facility | Payer: MEDICARE | Source: Other Acute Inpatient Hospital | Attending: Critical Care Medicine | Admitting: Critical Care Medicine

## 2017-11-19 DIAGNOSIS — M180 Bilateral primary osteoarthritis of first carpometacarpal joints: Principal | ICD-10-CM

## 2017-11-19 DIAGNOSIS — D649 Anemia, unspecified: ICD-10-CM

## 2017-11-19 DIAGNOSIS — I130 Hypertensive heart and chronic kidney disease with heart failure and stage 1 through stage 4 chronic kidney disease, or unspecified chronic kidney disease: ICD-10-CM

## 2017-11-19 DIAGNOSIS — J449 Chronic obstructive pulmonary disease, unspecified: ICD-10-CM

## 2017-11-19 DIAGNOSIS — G4733 Obstructive sleep apnea (adult) (pediatric): ICD-10-CM

## 2017-11-19 DIAGNOSIS — R269 Unspecified abnormalities of gait and mobility: ICD-10-CM

## 2017-11-19 DIAGNOSIS — N183 Chronic kidney disease, stage 3 (moderate): ICD-10-CM

## 2017-11-19 DIAGNOSIS — E871 Hypo-osmolality and hyponatremia: ICD-10-CM

## 2017-11-19 DIAGNOSIS — S0181XA Laceration without foreign body of other part of head, initial encounter: ICD-10-CM

## 2017-11-19 DIAGNOSIS — F329 Major depressive disorder, single episode, unspecified: ICD-10-CM

## 2017-11-19 DIAGNOSIS — R55 Syncope and collapse: ICD-10-CM

## 2017-11-19 DIAGNOSIS — J9612 Chronic respiratory failure with hypercapnia: ICD-10-CM

## 2017-11-19 DIAGNOSIS — Z9981 Dependence on supplemental oxygen: ICD-10-CM

## 2017-11-19 DIAGNOSIS — Z9049 Acquired absence of other specified parts of digestive tract: ICD-10-CM

## 2017-11-19 DIAGNOSIS — N3281 Overactive bladder: ICD-10-CM

## 2017-11-19 DIAGNOSIS — E782 Mixed hyperlipidemia: ICD-10-CM

## 2017-11-19 DIAGNOSIS — Z6841 Body Mass Index (BMI) 40.0 and over, adult: ICD-10-CM

## 2017-11-19 DIAGNOSIS — Z7982 Long term (current) use of aspirin: ICD-10-CM

## 2017-11-19 DIAGNOSIS — Z7902 Long term (current) use of antithrombotics/antiplatelets: ICD-10-CM

## 2017-11-19 DIAGNOSIS — Z87891 Personal history of nicotine dependence: ICD-10-CM

## 2017-11-19 DIAGNOSIS — E1122 Type 2 diabetes mellitus with diabetic chronic kidney disease: ICD-10-CM

## 2017-11-19 DIAGNOSIS — K219 Gastro-esophageal reflux disease without esophagitis: ICD-10-CM

## 2017-11-19 DIAGNOSIS — S060X1A Concussion with loss of consciousness of 30 minutes or less, initial encounter: ICD-10-CM

## 2017-11-19 DIAGNOSIS — S43004A Unspecified dislocation of right shoulder joint, initial encounter: Principal | ICD-10-CM

## 2017-11-19 DIAGNOSIS — G8911 Acute pain due to trauma: ICD-10-CM

## 2017-11-19 DIAGNOSIS — I2510 Atherosclerotic heart disease of native coronary artery without angina pectoris: ICD-10-CM

## 2017-11-19 DIAGNOSIS — Z9861 Coronary angioplasty status: ICD-10-CM

## 2017-11-19 DIAGNOSIS — M19049 Primary osteoarthritis, unspecified hand: ICD-10-CM

## 2017-11-19 DIAGNOSIS — M19041 Primary osteoarthritis, right hand: ICD-10-CM

## 2017-11-19 DIAGNOSIS — G5603 Carpal tunnel syndrome, bilateral upper limbs: ICD-10-CM

## 2017-11-19 DIAGNOSIS — M25532 Pain in left wrist: ICD-10-CM

## 2017-11-19 LAB — MAGNESIUM: Lab: 2.8 mg/dL — ABNORMAL HIGH (ref >60)

## 2017-11-19 LAB — BASIC METABOLIC PANEL: Lab: 134 MMOL/L — ABNORMAL LOW (ref 137–147)

## 2017-11-19 LAB — CBC: Lab: 7.2 K/UL — ABNORMAL LOW (ref 4.5–11.0)

## 2017-11-19 LAB — PHOSPHORUS: Lab: 2.7 mg/dL — ABNORMAL HIGH (ref >60)

## 2017-11-19 MED ORDER — PANTOPRAZOLE 40 MG PO TBEC
40 mg | ORAL_TABLET | Freq: Every day | ORAL | 3 refills | Status: CN
Start: 2017-11-19 — End: ?

## 2017-11-19 MED ORDER — TIZANIDINE 2 MG PO TAB
2 mg | ORAL_TABLET | Freq: Three times a day (TID) | ORAL | 0 refills | Status: AC | PRN
Start: 2017-11-19 — End: 2018-01-08

## 2017-11-19 MED ORDER — BUDESONIDE-FORMOTEROL 160-4.5 MCG/ACTUATION IN HFAA
2 | Freq: Two times a day (BID) | RESPIRATORY_TRACT | 3 refills | Status: CN
Start: 2017-11-19 — End: ?

## 2017-11-19 MED ORDER — IPRATROPIUM BROMIDE 0.02 % IN SOLN
1 | RESPIRATORY_TRACT | 0 refills | Status: SS | PRN
Start: 2017-11-19 — End: 2018-03-01

## 2017-11-19 MED ORDER — SPIRONOLACTONE 25 MG PO TAB
25 mg | ORAL_TABLET | Freq: Every day | ORAL | 3 refills | 46.00000 days | Status: AC
Start: 2017-11-19 — End: 2018-03-18

## 2017-11-19 MED ORDER — FLUTICASONE PROPIONATE 50 MCG/ACTUATION NA SPSN
2 | Freq: Every day | NASAL | 0 refills | Status: CN
Start: 2017-11-19 — End: ?

## 2017-11-19 MED ORDER — BACITRACIN ZINC 500 UNIT/GRAM TP OINT
Freq: Two times a day (BID) | 0 refills | Status: SS
Start: 2017-11-19 — End: 2018-03-01

## 2017-11-19 MED ORDER — IPRATROPIUM BROMIDE 42 MCG (0.06 %) NA SPRY
1 | NASAL | 12 refills | Status: CN | PRN
Start: 2017-11-19 — End: ?

## 2017-11-19 MED ORDER — ACETAMINOPHEN 325 MG PO TAB
650 mg | ORAL_TABLET | ORAL | 0 refills | Status: AC | PRN
Start: 2017-11-19 — End: ?

## 2017-11-19 MED ORDER — ASPIRIN 81 MG PO TBEC
81 mg | ORAL_TABLET | Freq: Every day | ORAL | 3 refills | Status: CN
Start: 2017-11-19 — End: ?

## 2017-11-19 MED ORDER — ALBUTEROL SULFATE 2.5 MG/0.5 ML IN NEBU
2.5 mg | RESPIRATORY_TRACT | 0 refills | Status: CN | PRN
Start: 2017-11-19 — End: ?

## 2017-11-19 MED ORDER — BUMETANIDE 2 MG PO TAB
2 mg | ORAL_TABLET | Freq: Every day | ORAL | 0 refills | Status: AC
Start: 2017-11-19 — End: 2017-12-10

## 2017-11-19 MED ORDER — INHALATIONAL SPACING DEVICE MISC SPCR
0 refills | Status: CN
Start: 2017-11-19 — End: ?

## 2017-11-20 ENCOUNTER — Encounter: Admit: 2017-11-20 | Discharge: 2017-11-20 | Payer: MEDICARE

## 2017-11-21 ENCOUNTER — Encounter: Admit: 2017-11-21 | Discharge: 2017-11-21 | Payer: MEDICARE

## 2017-11-21 DIAGNOSIS — Z95810 Presence of automatic (implantable) cardiac defibrillator: ICD-10-CM

## 2017-11-21 DIAGNOSIS — I255 Ischemic cardiomyopathy: Principal | ICD-10-CM

## 2017-11-22 ENCOUNTER — Encounter: Admit: 2017-11-22 | Discharge: 2017-11-22 | Payer: MEDICARE

## 2017-11-24 ENCOUNTER — Encounter: Admit: 2017-11-24 | Discharge: 2017-11-24 | Payer: MEDICARE

## 2017-11-27 ENCOUNTER — Encounter: Admit: 2017-11-27 | Discharge: 2017-11-27 | Payer: MEDICARE

## 2017-12-03 ENCOUNTER — Encounter: Admit: 2017-12-03 | Discharge: 2017-12-03 | Payer: MEDICARE

## 2017-12-07 ENCOUNTER — Encounter: Admit: 2017-12-07 | Discharge: 2017-12-07 | Payer: MEDICARE

## 2017-12-10 ENCOUNTER — Encounter: Admit: 2017-12-10 | Discharge: 2017-12-10 | Payer: MEDICARE

## 2017-12-10 MED ORDER — BUMETANIDE 2 MG PO TAB
2 mg | ORAL_TABLET | Freq: Two times a day (BID) | ORAL | 0 refills | Status: AC
Start: 2017-12-10 — End: 2018-02-25

## 2017-12-12 ENCOUNTER — Encounter: Admit: 2017-12-12 | Discharge: 2017-12-12 | Payer: MEDICARE

## 2017-12-13 ENCOUNTER — Ambulatory Visit: Admit: 2017-12-13 | Discharge: 2017-12-14 | Payer: MEDICARE

## 2017-12-14 ENCOUNTER — Encounter: Admit: 2017-12-14 | Discharge: 2017-12-14 | Payer: MEDICARE

## 2017-12-14 DIAGNOSIS — I5042 Chronic combined systolic (congestive) and diastolic (congestive) heart failure: Principal | ICD-10-CM

## 2017-12-14 MED ORDER — BACITRACIN ZINC 500 UNIT/GRAM TP OINT
Freq: Two times a day (BID) | TOPICAL | 0 refills | 14.00000 days | Status: AC
Start: 2017-12-14 — End: 2018-02-27

## 2017-12-14 MED ORDER — HYOSCYAMINE SULFATE 0.125 MG SL SUBL
125 ug | ORAL_TABLET | SUBLINGUAL | 0 refills | Status: SS | PRN
Start: 2017-12-14 — End: 2018-03-01

## 2017-12-15 ENCOUNTER — Encounter: Admit: 2017-12-15 | Discharge: 2017-12-15 | Payer: MEDICARE

## 2017-12-17 ENCOUNTER — Ambulatory Visit: Admit: 2017-12-17 | Discharge: 2017-12-18 | Payer: MEDICARE

## 2017-12-17 ENCOUNTER — Encounter: Admit: 2017-12-17 | Discharge: 2017-12-17 | Payer: MEDICARE

## 2017-12-17 DIAGNOSIS — Z9981 Dependence on supplemental oxygen: ICD-10-CM

## 2017-12-17 DIAGNOSIS — I5042 Chronic combined systolic (congestive) and diastolic (congestive) heart failure: ICD-10-CM

## 2017-12-17 DIAGNOSIS — N183 Chronic kidney disease, stage 3 (moderate): ICD-10-CM

## 2017-12-17 DIAGNOSIS — I429 Cardiomyopathy, unspecified: ICD-10-CM

## 2017-12-17 DIAGNOSIS — G4733 Obstructive sleep apnea (adult) (pediatric): ICD-10-CM

## 2017-12-17 DIAGNOSIS — J479 Bronchiectasis, uncomplicated: ICD-10-CM

## 2017-12-17 DIAGNOSIS — I509 Heart failure, unspecified: Principal | ICD-10-CM

## 2017-12-17 DIAGNOSIS — Z95828 Presence of other vascular implants and grafts: ICD-10-CM

## 2017-12-17 DIAGNOSIS — Z8679 Personal history of other diseases of the circulatory system: ICD-10-CM

## 2017-12-17 DIAGNOSIS — Z8614 Personal history of Methicillin resistant Staphylococcus aureus infection: ICD-10-CM

## 2017-12-17 DIAGNOSIS — J302 Other seasonal allergic rhinitis: ICD-10-CM

## 2017-12-17 DIAGNOSIS — K219 Gastro-esophageal reflux disease without esophagitis: ICD-10-CM

## 2017-12-17 DIAGNOSIS — M954 Acquired deformity of chest and rib: ICD-10-CM

## 2017-12-17 DIAGNOSIS — L03116 Cellulitis of left lower limb: ICD-10-CM

## 2017-12-17 DIAGNOSIS — J45909 Unspecified asthma, uncomplicated: ICD-10-CM

## 2017-12-17 DIAGNOSIS — N401 Enlarged prostate with lower urinary tract symptoms: ICD-10-CM

## 2017-12-17 DIAGNOSIS — R05 Cough: ICD-10-CM

## 2017-12-17 DIAGNOSIS — I10 Essential (primary) hypertension: ICD-10-CM

## 2017-12-17 DIAGNOSIS — E782 Mixed hyperlipidemia: ICD-10-CM

## 2017-12-17 DIAGNOSIS — J449 Chronic obstructive pulmonary disease, unspecified: ICD-10-CM

## 2017-12-17 DIAGNOSIS — M961 Postlaminectomy syndrome, not elsewhere classified: ICD-10-CM

## 2017-12-17 DIAGNOSIS — I2510 Atherosclerotic heart disease of native coronary artery without angina pectoris: ICD-10-CM

## 2017-12-17 DIAGNOSIS — R0600 Dyspnea, unspecified: ICD-10-CM

## 2017-12-17 DIAGNOSIS — J189 Pneumonia, unspecified organism: ICD-10-CM

## 2017-12-17 DIAGNOSIS — I714 Abdominal aortic aneurysm, without rupture: ICD-10-CM

## 2017-12-17 DIAGNOSIS — R609 Edema, unspecified: ICD-10-CM

## 2017-12-17 MED ORDER — PANTOPRAZOLE 40 MG PO TBEC
ORAL_TABLET | Freq: Two times a day (BID) | ORAL | 0 refills | 90.00000 days | Status: AC
Start: 2017-12-17 — End: 2018-02-25

## 2017-12-17 MED ORDER — HYDROCODONE-ACETAMINOPHEN 5-325 MG PO TAB
1 | ORAL_TABLET | ORAL | 0 refills | 30.00000 days | Status: AC | PRN
Start: 2017-12-17 — End: 2018-02-15

## 2017-12-18 ENCOUNTER — Encounter: Admit: 2017-12-18 | Discharge: 2017-12-18 | Payer: MEDICARE

## 2017-12-18 ENCOUNTER — Ambulatory Visit: Admit: 2017-12-18 | Discharge: 2017-12-18 | Payer: MEDICARE

## 2017-12-18 ENCOUNTER — Ambulatory Visit: Admit: 2017-12-18 | Discharge: 2017-12-19 | Payer: MEDICARE

## 2017-12-18 DIAGNOSIS — N183 Chronic kidney disease, stage 3 (moderate): ICD-10-CM

## 2017-12-18 DIAGNOSIS — I5042 Chronic combined systolic (congestive) and diastolic (congestive) heart failure: ICD-10-CM

## 2017-12-18 DIAGNOSIS — I10 Essential (primary) hypertension: ICD-10-CM

## 2017-12-18 DIAGNOSIS — N401 Enlarged prostate with lower urinary tract symptoms: ICD-10-CM

## 2017-12-18 DIAGNOSIS — G4733 Obstructive sleep apnea (adult) (pediatric): ICD-10-CM

## 2017-12-18 DIAGNOSIS — K219 Gastro-esophageal reflux disease without esophagitis: ICD-10-CM

## 2017-12-18 DIAGNOSIS — I714 Abdominal aortic aneurysm, without rupture: ICD-10-CM

## 2017-12-18 DIAGNOSIS — R609 Edema, unspecified: ICD-10-CM

## 2017-12-18 DIAGNOSIS — Z95828 Presence of other vascular implants and grafts: ICD-10-CM

## 2017-12-18 DIAGNOSIS — J45909 Unspecified asthma, uncomplicated: ICD-10-CM

## 2017-12-18 DIAGNOSIS — I255 Ischemic cardiomyopathy: ICD-10-CM

## 2017-12-18 DIAGNOSIS — Z8679 Personal history of other diseases of the circulatory system: ICD-10-CM

## 2017-12-18 DIAGNOSIS — S43004S Unspecified dislocation of right shoulder joint, sequela: Principal | ICD-10-CM

## 2017-12-18 DIAGNOSIS — Z23 Encounter for immunization: ICD-10-CM

## 2017-12-18 DIAGNOSIS — J189 Pneumonia, unspecified organism: ICD-10-CM

## 2017-12-18 DIAGNOSIS — J479 Bronchiectasis, uncomplicated: ICD-10-CM

## 2017-12-18 DIAGNOSIS — I429 Cardiomyopathy, unspecified: ICD-10-CM

## 2017-12-18 DIAGNOSIS — I2510 Atherosclerotic heart disease of native coronary artery without angina pectoris: Secondary | ICD-10-CM

## 2017-12-18 DIAGNOSIS — E782 Mixed hyperlipidemia: ICD-10-CM

## 2017-12-18 DIAGNOSIS — Z9981 Dependence on supplemental oxygen: ICD-10-CM

## 2017-12-18 DIAGNOSIS — T1490XA Injury, unspecified, initial encounter: ICD-10-CM

## 2017-12-18 DIAGNOSIS — D801 Nonfamilial hypogammaglobulinemia: ICD-10-CM

## 2017-12-18 DIAGNOSIS — Z959 Presence of cardiac and vascular implant and graft, unspecified: ICD-10-CM

## 2017-12-18 DIAGNOSIS — R0600 Dyspnea, unspecified: ICD-10-CM

## 2017-12-18 DIAGNOSIS — I509 Heart failure, unspecified: Principal | ICD-10-CM

## 2017-12-18 DIAGNOSIS — J302 Other seasonal allergic rhinitis: ICD-10-CM

## 2017-12-18 DIAGNOSIS — M961 Postlaminectomy syndrome, not elsewhere classified: ICD-10-CM

## 2017-12-18 DIAGNOSIS — J449 Chronic obstructive pulmonary disease, unspecified: ICD-10-CM

## 2017-12-18 DIAGNOSIS — L03116 Cellulitis of left lower limb: ICD-10-CM

## 2017-12-18 DIAGNOSIS — R338 Other retention of urine: Principal | ICD-10-CM

## 2017-12-18 DIAGNOSIS — Z8614 Personal history of Methicillin resistant Staphylococcus aureus infection: ICD-10-CM

## 2017-12-18 DIAGNOSIS — M954 Acquired deformity of chest and rib: ICD-10-CM

## 2017-12-18 DIAGNOSIS — R05 Cough: ICD-10-CM

## 2017-12-18 DIAGNOSIS — N138 Other obstructive and reflux uropathy: ICD-10-CM

## 2017-12-18 LAB — BASIC METABOLIC PANEL
Lab: 134 MMOL/L — ABNORMAL LOW (ref 137–147)
Lab: 150 mg/dL — ABNORMAL HIGH (ref 70–100)
Lab: 26 MMOL/L (ref 21–30)
Lab: 31 mg/dL — ABNORMAL HIGH (ref 7–25)
Lab: 4 MMOL/L (ref 3.5–5.1)
Lab: 12 (ref 3–12)

## 2017-12-18 LAB — MAGNESIUM: Lab: 1.3 mg/dL — ABNORMAL LOW (ref 1.6–2.6)

## 2017-12-19 ENCOUNTER — Encounter: Admit: 2017-12-19 | Discharge: 2017-12-19 | Payer: MEDICARE

## 2017-12-21 ENCOUNTER — Encounter: Admit: 2017-12-21 | Discharge: 2017-12-21 | Payer: MEDICARE

## 2017-12-21 DIAGNOSIS — N401 Enlarged prostate with lower urinary tract symptoms: ICD-10-CM

## 2017-12-21 DIAGNOSIS — R609 Edema, unspecified: ICD-10-CM

## 2017-12-21 DIAGNOSIS — R05 Cough: ICD-10-CM

## 2017-12-21 DIAGNOSIS — J479 Bronchiectasis, uncomplicated: ICD-10-CM

## 2017-12-21 DIAGNOSIS — Z95828 Presence of other vascular implants and grafts: ICD-10-CM

## 2017-12-21 DIAGNOSIS — G4733 Obstructive sleep apnea (adult) (pediatric): ICD-10-CM

## 2017-12-21 DIAGNOSIS — Z9981 Dependence on supplemental oxygen: ICD-10-CM

## 2017-12-21 DIAGNOSIS — J302 Other seasonal allergic rhinitis: ICD-10-CM

## 2017-12-21 DIAGNOSIS — J189 Pneumonia, unspecified organism: ICD-10-CM

## 2017-12-21 DIAGNOSIS — L03116 Cellulitis of left lower limb: ICD-10-CM

## 2017-12-21 DIAGNOSIS — S43004S Unspecified dislocation of right shoulder joint, sequela: Principal | ICD-10-CM

## 2017-12-21 DIAGNOSIS — J45909 Unspecified asthma, uncomplicated: ICD-10-CM

## 2017-12-21 DIAGNOSIS — Z8614 Personal history of Methicillin resistant Staphylococcus aureus infection: ICD-10-CM

## 2017-12-21 DIAGNOSIS — M961 Postlaminectomy syndrome, not elsewhere classified: ICD-10-CM

## 2017-12-21 DIAGNOSIS — I2510 Atherosclerotic heart disease of native coronary artery without angina pectoris: Secondary | ICD-10-CM

## 2017-12-21 DIAGNOSIS — M954 Acquired deformity of chest and rib: ICD-10-CM

## 2017-12-21 DIAGNOSIS — I509 Heart failure, unspecified: Principal | ICD-10-CM

## 2017-12-21 DIAGNOSIS — Z8679 Personal history of other diseases of the circulatory system: ICD-10-CM

## 2017-12-21 DIAGNOSIS — I429 Cardiomyopathy, unspecified: ICD-10-CM

## 2017-12-21 DIAGNOSIS — R0600 Dyspnea, unspecified: ICD-10-CM

## 2017-12-21 DIAGNOSIS — I714 Abdominal aortic aneurysm, without rupture: ICD-10-CM

## 2017-12-21 DIAGNOSIS — N183 Chronic kidney disease, stage 3 (moderate): ICD-10-CM

## 2017-12-21 DIAGNOSIS — I5042 Chronic combined systolic (congestive) and diastolic (congestive) heart failure: ICD-10-CM

## 2017-12-21 DIAGNOSIS — K219 Gastro-esophageal reflux disease without esophagitis: ICD-10-CM

## 2017-12-21 DIAGNOSIS — I10 Essential (primary) hypertension: ICD-10-CM

## 2017-12-21 DIAGNOSIS — E782 Mixed hyperlipidemia: ICD-10-CM

## 2017-12-21 DIAGNOSIS — J449 Chronic obstructive pulmonary disease, unspecified: ICD-10-CM

## 2017-12-28 ENCOUNTER — Encounter: Admit: 2017-12-28 | Discharge: 2017-12-28 | Payer: MEDICARE

## 2017-12-28 ENCOUNTER — Ambulatory Visit: Admit: 2017-12-28 | Discharge: 2017-12-28 | Payer: MEDICARE

## 2017-12-28 DIAGNOSIS — Z9981 Dependence on supplemental oxygen: ICD-10-CM

## 2017-12-28 DIAGNOSIS — N401 Enlarged prostate with lower urinary tract symptoms: ICD-10-CM

## 2017-12-28 DIAGNOSIS — I255 Ischemic cardiomyopathy: ICD-10-CM

## 2017-12-28 DIAGNOSIS — N183 Chronic kidney disease, stage 3 (moderate): ICD-10-CM

## 2017-12-28 DIAGNOSIS — I5042 Chronic combined systolic (congestive) and diastolic (congestive) heart failure: Principal | ICD-10-CM

## 2017-12-28 DIAGNOSIS — M954 Acquired deformity of chest and rib: ICD-10-CM

## 2017-12-28 DIAGNOSIS — Z8679 Personal history of other diseases of the circulatory system: ICD-10-CM

## 2017-12-28 DIAGNOSIS — G4733 Obstructive sleep apnea (adult) (pediatric): ICD-10-CM

## 2017-12-28 DIAGNOSIS — I714 Abdominal aortic aneurysm, without rupture: ICD-10-CM

## 2017-12-28 DIAGNOSIS — I951 Orthostatic hypotension: ICD-10-CM

## 2017-12-28 DIAGNOSIS — J449 Chronic obstructive pulmonary disease, unspecified: ICD-10-CM

## 2017-12-28 DIAGNOSIS — I509 Heart failure, unspecified: Principal | ICD-10-CM

## 2017-12-28 DIAGNOSIS — I10 Essential (primary) hypertension: ICD-10-CM

## 2017-12-28 DIAGNOSIS — E782 Mixed hyperlipidemia: ICD-10-CM

## 2017-12-28 DIAGNOSIS — K219 Gastro-esophageal reflux disease without esophagitis: ICD-10-CM

## 2017-12-28 DIAGNOSIS — J45909 Unspecified asthma, uncomplicated: ICD-10-CM

## 2017-12-28 DIAGNOSIS — J189 Pneumonia, unspecified organism: ICD-10-CM

## 2017-12-28 DIAGNOSIS — E871 Hypo-osmolality and hyponatremia: ICD-10-CM

## 2017-12-28 DIAGNOSIS — Z95828 Presence of other vascular implants and grafts: ICD-10-CM

## 2017-12-28 DIAGNOSIS — I2510 Atherosclerotic heart disease of native coronary artery without angina pectoris: Secondary | ICD-10-CM

## 2017-12-28 DIAGNOSIS — J479 Bronchiectasis, uncomplicated: ICD-10-CM

## 2017-12-28 DIAGNOSIS — I429 Cardiomyopathy, unspecified: ICD-10-CM

## 2017-12-28 DIAGNOSIS — R609 Edema, unspecified: ICD-10-CM

## 2017-12-28 DIAGNOSIS — L03116 Cellulitis of left lower limb: ICD-10-CM

## 2017-12-28 DIAGNOSIS — R0600 Dyspnea, unspecified: ICD-10-CM

## 2017-12-28 DIAGNOSIS — R05 Cough: ICD-10-CM

## 2017-12-28 DIAGNOSIS — M961 Postlaminectomy syndrome, not elsewhere classified: ICD-10-CM

## 2017-12-28 DIAGNOSIS — Z8614 Personal history of Methicillin resistant Staphylococcus aureus infection: ICD-10-CM

## 2017-12-28 DIAGNOSIS — J302 Other seasonal allergic rhinitis: ICD-10-CM

## 2017-12-28 LAB — BASIC METABOLIC PANEL
Lab: 1.1 mg/dL (ref 0.4–1.24)
Lab: 118 mg/dL — ABNORMAL HIGH (ref 70–100)
Lab: 13 — ABNORMAL HIGH (ref 3–12)
Lab: 134 MMOL/L — ABNORMAL LOW (ref 137–147)
Lab: 24 MMOL/L (ref 21–30)
Lab: 3.9 MMOL/L (ref 3.5–5.1)
Lab: 31 mg/dL — ABNORMAL HIGH (ref 7–25)
Lab: 9.4 mg/dL (ref 8.5–10.6)
Lab: 97 MMOL/L — ABNORMAL LOW (ref 98–110)
Lab: >60 mL/min (ref >60)
Lab: >60 mL/min (ref >60)

## 2017-12-28 MED ORDER — PERFLUTREN LIPID MICROSPHERES 1.1 MG/ML IV SUSP
1-20 mL | Freq: Once | INTRAVENOUS | 0 refills | Status: CP | PRN
Start: 2017-12-28 — End: ?
  Administered 2017-12-28: 19:00:00 2 mL via INTRAVENOUS

## 2017-12-28 MED ORDER — MIDODRINE 5 MG PO TAB
5 mg | ORAL_TABLET | Freq: Two times a day (BID) | ORAL | 11 refills | Status: AC
Start: 2017-12-28 — End: 2018-12-15

## 2018-01-02 ENCOUNTER — Encounter: Admit: 2018-01-02 | Discharge: 2018-01-02 | Payer: MEDICARE

## 2018-01-02 DIAGNOSIS — J4540 Moderate persistent asthma, uncomplicated: Principal | ICD-10-CM

## 2018-01-02 DIAGNOSIS — J189 Pneumonia, unspecified organism: ICD-10-CM

## 2018-01-02 DIAGNOSIS — R05 Cough: ICD-10-CM

## 2018-01-02 DIAGNOSIS — R0602 Shortness of breath: ICD-10-CM

## 2018-01-02 DIAGNOSIS — J479 Bronchiectasis, uncomplicated: ICD-10-CM

## 2018-01-02 DIAGNOSIS — J9612 Chronic respiratory failure with hypercapnia: ICD-10-CM

## 2018-01-02 DIAGNOSIS — J471 Bronchiectasis with (acute) exacerbation: ICD-10-CM

## 2018-01-02 DIAGNOSIS — R0989 Other specified symptoms and signs involving the circulatory and respiratory systems: ICD-10-CM

## 2018-01-03 ENCOUNTER — Encounter: Admit: 2018-01-03 | Discharge: 2018-01-03 | Payer: MEDICARE

## 2018-01-03 ENCOUNTER — Encounter: Admit: 2018-01-03 | Discharge: 2018-01-04 | Payer: MEDICARE

## 2018-01-08 ENCOUNTER — Encounter: Admit: 2018-01-08 | Discharge: 2018-01-08 | Payer: MEDICARE

## 2018-01-08 DIAGNOSIS — N183 Chronic kidney disease, stage 3 (moderate): ICD-10-CM

## 2018-01-08 DIAGNOSIS — N401 Enlarged prostate with lower urinary tract symptoms: ICD-10-CM

## 2018-01-08 DIAGNOSIS — M954 Acquired deformity of chest and rib: ICD-10-CM

## 2018-01-08 DIAGNOSIS — J302 Other seasonal allergic rhinitis: ICD-10-CM

## 2018-01-08 DIAGNOSIS — J449 Chronic obstructive pulmonary disease, unspecified: ICD-10-CM

## 2018-01-08 DIAGNOSIS — I5042 Chronic combined systolic (congestive) and diastolic (congestive) heart failure: ICD-10-CM

## 2018-01-08 DIAGNOSIS — I714 Abdominal aortic aneurysm, without rupture: ICD-10-CM

## 2018-01-08 DIAGNOSIS — R0600 Dyspnea, unspecified: ICD-10-CM

## 2018-01-08 DIAGNOSIS — I2510 Atherosclerotic heart disease of native coronary artery without angina pectoris: Secondary | ICD-10-CM

## 2018-01-08 DIAGNOSIS — G4733 Obstructive sleep apnea (adult) (pediatric): ICD-10-CM

## 2018-01-08 DIAGNOSIS — Z95828 Presence of other vascular implants and grafts: ICD-10-CM

## 2018-01-08 DIAGNOSIS — J479 Bronchiectasis, uncomplicated: ICD-10-CM

## 2018-01-08 DIAGNOSIS — Z8679 Personal history of other diseases of the circulatory system: ICD-10-CM

## 2018-01-08 DIAGNOSIS — R05 Cough: ICD-10-CM

## 2018-01-08 DIAGNOSIS — M961 Postlaminectomy syndrome, not elsewhere classified: ICD-10-CM

## 2018-01-08 DIAGNOSIS — K219 Gastro-esophageal reflux disease without esophagitis: ICD-10-CM

## 2018-01-08 DIAGNOSIS — Z9981 Dependence on supplemental oxygen: ICD-10-CM

## 2018-01-08 DIAGNOSIS — J189 Pneumonia, unspecified organism: ICD-10-CM

## 2018-01-08 DIAGNOSIS — I429 Cardiomyopathy, unspecified: ICD-10-CM

## 2018-01-08 DIAGNOSIS — L03116 Cellulitis of left lower limb: ICD-10-CM

## 2018-01-08 DIAGNOSIS — I10 Essential (primary) hypertension: ICD-10-CM

## 2018-01-08 DIAGNOSIS — I509 Heart failure, unspecified: Principal | ICD-10-CM

## 2018-01-08 DIAGNOSIS — Z8614 Personal history of Methicillin resistant Staphylococcus aureus infection: ICD-10-CM

## 2018-01-08 DIAGNOSIS — J45909 Unspecified asthma, uncomplicated: ICD-10-CM

## 2018-01-08 DIAGNOSIS — R609 Edema, unspecified: ICD-10-CM

## 2018-01-08 DIAGNOSIS — E782 Mixed hyperlipidemia: ICD-10-CM

## 2018-01-08 MED ORDER — AZITHROMYCIN 250 MG PO TAB
ORAL_TABLET | Freq: Every day | 0 refills | Status: AC
Start: 2018-01-08 — End: 2018-02-22

## 2018-01-09 ENCOUNTER — Ambulatory Visit: Admit: 2018-01-08 | Discharge: 2018-01-09 | Payer: MEDICARE

## 2018-01-09 ENCOUNTER — Encounter: Admit: 2018-01-09 | Discharge: 2018-01-09 | Payer: MEDICARE

## 2018-01-09 DIAGNOSIS — M5136 Other intervertebral disc degeneration, lumbar region: Principal | ICD-10-CM

## 2018-01-09 DIAGNOSIS — M47819 Spondylosis without myelopathy or radiculopathy, site unspecified: ICD-10-CM

## 2018-01-09 DIAGNOSIS — J984 Other disorders of lung: ICD-10-CM

## 2018-01-09 DIAGNOSIS — I10 Essential (primary) hypertension: ICD-10-CM

## 2018-01-09 DIAGNOSIS — J439 Emphysema, unspecified: ICD-10-CM

## 2018-01-09 DIAGNOSIS — Z79899 Other long term (current) drug therapy: ICD-10-CM

## 2018-01-09 DIAGNOSIS — K5909 Other constipation: ICD-10-CM

## 2018-01-09 DIAGNOSIS — I5042 Chronic combined systolic (congestive) and diastolic (congestive) heart failure: Secondary | ICD-10-CM

## 2018-01-09 DIAGNOSIS — J470 Bronchiectasis with acute lower respiratory infection: Secondary | ICD-10-CM

## 2018-01-09 DIAGNOSIS — M5432 Sciatica, left side: Secondary | ICD-10-CM

## 2018-01-09 MED ORDER — TRAMADOL 50 MG PO TAB
50 mg | ORAL_TABLET | Freq: Two times a day (BID) | ORAL | 0 refills | Status: AC | PRN
Start: 2018-01-09 — End: 2018-01-25

## 2018-01-11 ENCOUNTER — Encounter: Admit: 2018-01-11 | Discharge: 2018-01-11 | Payer: MEDICARE

## 2018-01-11 DIAGNOSIS — J471 Bronchiectasis with (acute) exacerbation: Principal | ICD-10-CM

## 2018-01-11 MED ORDER — LEVOFLOXACIN 750 MG PO TAB
750 mg | ORAL_TABLET | Freq: Every day | ORAL | 0 refills | 7.00000 days | Status: AC
Start: 2018-01-11 — End: ?

## 2018-01-11 MED ORDER — PREDNISONE 20 MG PO TAB
40 mg | ORAL_TABLET | Freq: Every day | ORAL | 0 refills | Status: AC
Start: 2018-01-11 — End: ?

## 2018-01-11 MED ORDER — PREDNISONE 10 MG PO TAB
20 mg | ORAL_TABLET | Freq: Every day | ORAL | 0 refills | Status: AC
Start: 2018-01-11 — End: ?

## 2018-01-14 ENCOUNTER — Encounter: Admit: 2018-01-14 | Discharge: 2018-01-14 | Payer: MEDICARE

## 2018-01-14 ENCOUNTER — Ambulatory Visit: Admit: 2018-01-14 | Discharge: 2018-01-14 | Payer: MEDICARE

## 2018-01-14 DIAGNOSIS — R05 Cough: ICD-10-CM

## 2018-01-14 DIAGNOSIS — Z95828 Presence of other vascular implants and grafts: ICD-10-CM

## 2018-01-14 DIAGNOSIS — I5042 Chronic combined systolic (congestive) and diastolic (congestive) heart failure: ICD-10-CM

## 2018-01-14 DIAGNOSIS — J302 Other seasonal allergic rhinitis: ICD-10-CM

## 2018-01-14 DIAGNOSIS — N183 Chronic kidney disease, stage 3 (moderate): ICD-10-CM

## 2018-01-14 DIAGNOSIS — B999 Unspecified infectious disease: ICD-10-CM

## 2018-01-14 DIAGNOSIS — R768 Other specified abnormal immunological findings in serum: ICD-10-CM

## 2018-01-14 DIAGNOSIS — L03116 Cellulitis of left lower limb: ICD-10-CM

## 2018-01-14 DIAGNOSIS — R0600 Dyspnea, unspecified: ICD-10-CM

## 2018-01-14 DIAGNOSIS — N138 Other obstructive and reflux uropathy: ICD-10-CM

## 2018-01-14 DIAGNOSIS — N401 Enlarged prostate with lower urinary tract symptoms: ICD-10-CM

## 2018-01-14 DIAGNOSIS — K219 Gastro-esophageal reflux disease without esophagitis: ICD-10-CM

## 2018-01-14 DIAGNOSIS — H109 Unspecified conjunctivitis: ICD-10-CM

## 2018-01-14 DIAGNOSIS — J4540 Moderate persistent asthma, uncomplicated: ICD-10-CM

## 2018-01-14 DIAGNOSIS — J479 Bronchiectasis, uncomplicated: ICD-10-CM

## 2018-01-14 DIAGNOSIS — J470 Bronchiectasis with acute lower respiratory infection: ICD-10-CM

## 2018-01-14 DIAGNOSIS — J449 Chronic obstructive pulmonary disease, unspecified: ICD-10-CM

## 2018-01-14 DIAGNOSIS — I429 Cardiomyopathy, unspecified: ICD-10-CM

## 2018-01-14 DIAGNOSIS — M954 Acquired deformity of chest and rib: ICD-10-CM

## 2018-01-14 DIAGNOSIS — Z8679 Personal history of other diseases of the circulatory system: ICD-10-CM

## 2018-01-14 DIAGNOSIS — D801 Nonfamilial hypogammaglobulinemia: ICD-10-CM

## 2018-01-14 DIAGNOSIS — R350 Frequency of micturition: ICD-10-CM

## 2018-01-14 DIAGNOSIS — Z9981 Dependence on supplemental oxygen: ICD-10-CM

## 2018-01-14 DIAGNOSIS — N3941 Urge incontinence: ICD-10-CM

## 2018-01-14 DIAGNOSIS — I10 Essential (primary) hypertension: ICD-10-CM

## 2018-01-14 DIAGNOSIS — I2510 Atherosclerotic heart disease of native coronary artery without angina pectoris: ICD-10-CM

## 2018-01-14 DIAGNOSIS — J189 Pneumonia, unspecified organism: ICD-10-CM

## 2018-01-14 DIAGNOSIS — I714 Abdominal aortic aneurysm, without rupture: ICD-10-CM

## 2018-01-14 DIAGNOSIS — J310 Chronic rhinitis: ICD-10-CM

## 2018-01-14 DIAGNOSIS — N3281 Overactive bladder: ICD-10-CM

## 2018-01-14 DIAGNOSIS — R609 Edema, unspecified: ICD-10-CM

## 2018-01-14 DIAGNOSIS — M961 Postlaminectomy syndrome, not elsewhere classified: ICD-10-CM

## 2018-01-14 DIAGNOSIS — E782 Mixed hyperlipidemia: ICD-10-CM

## 2018-01-14 DIAGNOSIS — J45909 Unspecified asthma, uncomplicated: ICD-10-CM

## 2018-01-14 DIAGNOSIS — R3915 Urgency of urination: ICD-10-CM

## 2018-01-14 DIAGNOSIS — I509 Heart failure, unspecified: Principal | ICD-10-CM

## 2018-01-14 DIAGNOSIS — G4733 Obstructive sleep apnea (adult) (pediatric): ICD-10-CM

## 2018-01-14 DIAGNOSIS — Z8614 Personal history of Methicillin resistant Staphylococcus aureus infection: ICD-10-CM

## 2018-01-14 MED ORDER — FINASTERIDE 5 MG PO TAB
5 mg | ORAL_TABLET | Freq: Every day | ORAL | 3 refills | Status: AC
Start: 2018-01-14 — End: 2019-02-19

## 2018-01-14 MED ORDER — DOXYCYCLINE HYCLATE 100 MG PO CAP
100 mg | ORAL_CAPSULE | Freq: Every day | ORAL | 1 refills | 8.00000 days | Status: AC
Start: 2018-01-14 — End: 2018-02-27

## 2018-01-15 ENCOUNTER — Encounter: Admit: 2018-01-15 | Discharge: 2018-01-15 | Payer: MEDICARE

## 2018-01-15 DIAGNOSIS — I2510 Atherosclerotic heart disease of native coronary artery without angina pectoris: ICD-10-CM

## 2018-01-15 DIAGNOSIS — N183 Chronic kidney disease, stage 3 (moderate): ICD-10-CM

## 2018-01-15 DIAGNOSIS — G4733 Obstructive sleep apnea (adult) (pediatric): ICD-10-CM

## 2018-01-15 DIAGNOSIS — E782 Mixed hyperlipidemia: ICD-10-CM

## 2018-01-15 DIAGNOSIS — R609 Edema, unspecified: ICD-10-CM

## 2018-01-15 DIAGNOSIS — J189 Pneumonia, unspecified organism: ICD-10-CM

## 2018-01-15 DIAGNOSIS — Z8614 Personal history of Methicillin resistant Staphylococcus aureus infection: ICD-10-CM

## 2018-01-15 DIAGNOSIS — J449 Chronic obstructive pulmonary disease, unspecified: ICD-10-CM

## 2018-01-15 DIAGNOSIS — Z8679 Personal history of other diseases of the circulatory system: ICD-10-CM

## 2018-01-15 DIAGNOSIS — J479 Bronchiectasis, uncomplicated: ICD-10-CM

## 2018-01-15 DIAGNOSIS — L03116 Cellulitis of left lower limb: ICD-10-CM

## 2018-01-15 DIAGNOSIS — I714 Abdominal aortic aneurysm, without rupture: ICD-10-CM

## 2018-01-15 DIAGNOSIS — I429 Cardiomyopathy, unspecified: ICD-10-CM

## 2018-01-15 DIAGNOSIS — J45909 Unspecified asthma, uncomplicated: ICD-10-CM

## 2018-01-15 DIAGNOSIS — K219 Gastro-esophageal reflux disease without esophagitis: ICD-10-CM

## 2018-01-15 DIAGNOSIS — R05 Cough: ICD-10-CM

## 2018-01-15 DIAGNOSIS — J302 Other seasonal allergic rhinitis: ICD-10-CM

## 2018-01-15 DIAGNOSIS — R0600 Dyspnea, unspecified: ICD-10-CM

## 2018-01-15 DIAGNOSIS — N401 Enlarged prostate with lower urinary tract symptoms: ICD-10-CM

## 2018-01-15 DIAGNOSIS — I10 Essential (primary) hypertension: ICD-10-CM

## 2018-01-15 DIAGNOSIS — M961 Postlaminectomy syndrome, not elsewhere classified: ICD-10-CM

## 2018-01-15 DIAGNOSIS — M954 Acquired deformity of chest and rib: ICD-10-CM

## 2018-01-15 DIAGNOSIS — Z95828 Presence of other vascular implants and grafts: ICD-10-CM

## 2018-01-15 DIAGNOSIS — I5042 Chronic combined systolic (congestive) and diastolic (congestive) heart failure: ICD-10-CM

## 2018-01-15 DIAGNOSIS — I509 Heart failure, unspecified: Principal | ICD-10-CM

## 2018-01-15 DIAGNOSIS — Z9981 Dependence on supplemental oxygen: ICD-10-CM

## 2018-01-17 ENCOUNTER — Encounter: Admit: 2018-01-17 | Discharge: 2018-01-17 | Payer: MEDICARE

## 2018-01-17 DIAGNOSIS — M954 Acquired deformity of chest and rib: ICD-10-CM

## 2018-01-17 DIAGNOSIS — Z9981 Dependence on supplemental oxygen: ICD-10-CM

## 2018-01-17 DIAGNOSIS — Z8679 Personal history of other diseases of the circulatory system: ICD-10-CM

## 2018-01-17 DIAGNOSIS — I429 Cardiomyopathy, unspecified: ICD-10-CM

## 2018-01-17 DIAGNOSIS — L03116 Cellulitis of left lower limb: ICD-10-CM

## 2018-01-17 DIAGNOSIS — I714 Abdominal aortic aneurysm, without rupture: ICD-10-CM

## 2018-01-17 DIAGNOSIS — G4733 Obstructive sleep apnea (adult) (pediatric): ICD-10-CM

## 2018-01-17 DIAGNOSIS — J449 Chronic obstructive pulmonary disease, unspecified: ICD-10-CM

## 2018-01-17 DIAGNOSIS — M961 Postlaminectomy syndrome, not elsewhere classified: ICD-10-CM

## 2018-01-17 DIAGNOSIS — E782 Mixed hyperlipidemia: ICD-10-CM

## 2018-01-17 DIAGNOSIS — Z95828 Presence of other vascular implants and grafts: ICD-10-CM

## 2018-01-17 DIAGNOSIS — R609 Edema, unspecified: ICD-10-CM

## 2018-01-17 DIAGNOSIS — I10 Essential (primary) hypertension: ICD-10-CM

## 2018-01-17 DIAGNOSIS — J45909 Unspecified asthma, uncomplicated: ICD-10-CM

## 2018-01-17 DIAGNOSIS — J189 Pneumonia, unspecified organism: ICD-10-CM

## 2018-01-17 DIAGNOSIS — Z8614 Personal history of Methicillin resistant Staphylococcus aureus infection: ICD-10-CM

## 2018-01-17 DIAGNOSIS — I2510 Atherosclerotic heart disease of native coronary artery without angina pectoris: Secondary | ICD-10-CM

## 2018-01-17 DIAGNOSIS — J302 Other seasonal allergic rhinitis: ICD-10-CM

## 2018-01-17 DIAGNOSIS — I509 Heart failure, unspecified: Principal | ICD-10-CM

## 2018-01-17 DIAGNOSIS — N183 Chronic kidney disease, stage 3 (moderate): ICD-10-CM

## 2018-01-17 DIAGNOSIS — R05 Cough: ICD-10-CM

## 2018-01-17 DIAGNOSIS — R0600 Dyspnea, unspecified: ICD-10-CM

## 2018-01-17 DIAGNOSIS — J479 Bronchiectasis, uncomplicated: ICD-10-CM

## 2018-01-17 DIAGNOSIS — K219 Gastro-esophageal reflux disease without esophagitis: ICD-10-CM

## 2018-01-17 DIAGNOSIS — N401 Enlarged prostate with lower urinary tract symptoms: ICD-10-CM

## 2018-01-17 DIAGNOSIS — I5042 Chronic combined systolic (congestive) and diastolic (congestive) heart failure: ICD-10-CM

## 2018-01-21 ENCOUNTER — Encounter: Admit: 2018-01-21 | Discharge: 2018-01-21 | Payer: MEDICARE

## 2018-01-23 ENCOUNTER — Encounter: Admit: 2018-01-23 | Discharge: 2018-01-23 | Payer: MEDICARE

## 2018-01-25 ENCOUNTER — Encounter: Admit: 2018-01-25 | Discharge: 2018-01-25 | Payer: MEDICARE

## 2018-01-25 ENCOUNTER — Ambulatory Visit: Admit: 2018-01-25 | Discharge: 2018-01-26 | Payer: MEDICARE

## 2018-01-25 DIAGNOSIS — Z95828 Presence of other vascular implants and grafts: ICD-10-CM

## 2018-01-25 DIAGNOSIS — N401 Enlarged prostate with lower urinary tract symptoms: ICD-10-CM

## 2018-01-25 DIAGNOSIS — J479 Bronchiectasis, uncomplicated: ICD-10-CM

## 2018-01-25 DIAGNOSIS — R609 Edema, unspecified: ICD-10-CM

## 2018-01-25 DIAGNOSIS — Z9981 Dependence on supplemental oxygen: ICD-10-CM

## 2018-01-25 DIAGNOSIS — Z8614 Personal history of Methicillin resistant Staphylococcus aureus infection: ICD-10-CM

## 2018-01-25 DIAGNOSIS — L03116 Cellulitis of left lower limb: ICD-10-CM

## 2018-01-25 DIAGNOSIS — M954 Acquired deformity of chest and rib: ICD-10-CM

## 2018-01-25 DIAGNOSIS — Z8679 Personal history of other diseases of the circulatory system: ICD-10-CM

## 2018-01-25 DIAGNOSIS — N183 Chronic kidney disease, stage 3 (moderate): ICD-10-CM

## 2018-01-25 DIAGNOSIS — I509 Heart failure, unspecified: Principal | ICD-10-CM

## 2018-01-25 DIAGNOSIS — G4733 Obstructive sleep apnea (adult) (pediatric): ICD-10-CM

## 2018-01-25 DIAGNOSIS — E782 Mixed hyperlipidemia: ICD-10-CM

## 2018-01-25 DIAGNOSIS — R0600 Dyspnea, unspecified: ICD-10-CM

## 2018-01-25 DIAGNOSIS — I429 Cardiomyopathy, unspecified: ICD-10-CM

## 2018-01-25 DIAGNOSIS — I2510 Atherosclerotic heart disease of native coronary artery without angina pectoris: Secondary | ICD-10-CM

## 2018-01-25 DIAGNOSIS — I10 Essential (primary) hypertension: ICD-10-CM

## 2018-01-25 DIAGNOSIS — J45909 Unspecified asthma, uncomplicated: ICD-10-CM

## 2018-01-25 DIAGNOSIS — J189 Pneumonia, unspecified organism: ICD-10-CM

## 2018-01-25 DIAGNOSIS — M961 Postlaminectomy syndrome, not elsewhere classified: ICD-10-CM

## 2018-01-25 DIAGNOSIS — K219 Gastro-esophageal reflux disease without esophagitis: ICD-10-CM

## 2018-01-25 DIAGNOSIS — R05 Cough: ICD-10-CM

## 2018-01-25 DIAGNOSIS — J302 Other seasonal allergic rhinitis: ICD-10-CM

## 2018-01-25 DIAGNOSIS — I5042 Chronic combined systolic (congestive) and diastolic (congestive) heart failure: ICD-10-CM

## 2018-01-25 DIAGNOSIS — I714 Abdominal aortic aneurysm, without rupture: ICD-10-CM

## 2018-01-25 DIAGNOSIS — J449 Chronic obstructive pulmonary disease, unspecified: ICD-10-CM

## 2018-01-25 MED ORDER — LIDOCAINE HCL 10 MG/ML (1 %) IJ SOLN
5 mL | Freq: Once | INTRAMUSCULAR | 0 refills | Status: CP
Start: 2018-01-25 — End: ?
  Administered 2018-01-25: 18:00:00 5 mL via INTRAMUSCULAR

## 2018-01-25 MED ORDER — TRAMADOL 50 MG PO TAB
50 mg | ORAL_TABLET | ORAL | 0 refills | Status: AC | PRN
Start: 2018-01-25 — End: 2018-12-15

## 2018-01-26 DIAGNOSIS — M4807 Spinal stenosis, lumbosacral region: Secondary | ICD-10-CM

## 2018-01-26 DIAGNOSIS — L89311 Pressure ulcer of right buttock, stage 1: Secondary | ICD-10-CM

## 2018-01-26 DIAGNOSIS — M4726 Other spondylosis with radiculopathy, lumbar region: ICD-10-CM

## 2018-01-26 DIAGNOSIS — J479 Bronchiectasis, uncomplicated: ICD-10-CM

## 2018-01-26 DIAGNOSIS — M7918 Myalgia, other site: ICD-10-CM

## 2018-01-26 DIAGNOSIS — M62830 Muscle spasm of back: Principal | ICD-10-CM

## 2018-01-28 ENCOUNTER — Encounter: Admit: 2018-01-28 | Discharge: 2018-01-28 | Payer: MEDICARE

## 2018-01-28 DIAGNOSIS — M4726 Other spondylosis with radiculopathy, lumbar region: Principal | ICD-10-CM

## 2018-01-29 ENCOUNTER — Encounter: Admit: 2018-01-29 | Discharge: 2018-01-29 | Payer: MEDICARE

## 2018-01-30 ENCOUNTER — Encounter: Admit: 2018-01-30 | Discharge: 2018-01-30 | Payer: MEDICARE

## 2018-01-30 ENCOUNTER — Ambulatory Visit: Admit: 2018-01-30 | Discharge: 2018-01-31 | Payer: MEDICARE

## 2018-01-30 DIAGNOSIS — Z9981 Dependence on supplemental oxygen: ICD-10-CM

## 2018-01-30 DIAGNOSIS — G4733 Obstructive sleep apnea (adult) (pediatric): ICD-10-CM

## 2018-01-30 DIAGNOSIS — I5042 Chronic combined systolic (congestive) and diastolic (congestive) heart failure: ICD-10-CM

## 2018-01-30 DIAGNOSIS — Z95828 Presence of other vascular implants and grafts: ICD-10-CM

## 2018-01-30 DIAGNOSIS — J189 Pneumonia, unspecified organism: ICD-10-CM

## 2018-01-30 DIAGNOSIS — R05 Cough: ICD-10-CM

## 2018-01-30 DIAGNOSIS — Z8679 Personal history of other diseases of the circulatory system: ICD-10-CM

## 2018-01-30 DIAGNOSIS — E782 Mixed hyperlipidemia: ICD-10-CM

## 2018-01-30 DIAGNOSIS — I714 Abdominal aortic aneurysm, without rupture: ICD-10-CM

## 2018-01-30 DIAGNOSIS — J479 Bronchiectasis, uncomplicated: ICD-10-CM

## 2018-01-30 DIAGNOSIS — J45909 Unspecified asthma, uncomplicated: ICD-10-CM

## 2018-01-30 DIAGNOSIS — M954 Acquired deformity of chest and rib: ICD-10-CM

## 2018-01-30 DIAGNOSIS — J449 Chronic obstructive pulmonary disease, unspecified: ICD-10-CM

## 2018-01-30 DIAGNOSIS — N401 Enlarged prostate with lower urinary tract symptoms: ICD-10-CM

## 2018-01-30 DIAGNOSIS — R609 Edema, unspecified: ICD-10-CM

## 2018-01-30 DIAGNOSIS — I429 Cardiomyopathy, unspecified: ICD-10-CM

## 2018-01-30 DIAGNOSIS — K219 Gastro-esophageal reflux disease without esophagitis: ICD-10-CM

## 2018-01-30 DIAGNOSIS — L03116 Cellulitis of left lower limb: ICD-10-CM

## 2018-01-30 DIAGNOSIS — M961 Postlaminectomy syndrome, not elsewhere classified: ICD-10-CM

## 2018-01-30 DIAGNOSIS — Z8614 Personal history of Methicillin resistant Staphylococcus aureus infection: ICD-10-CM

## 2018-01-30 DIAGNOSIS — R0600 Dyspnea, unspecified: ICD-10-CM

## 2018-01-30 DIAGNOSIS — N183 Chronic kidney disease, stage 3 (moderate): ICD-10-CM

## 2018-01-30 DIAGNOSIS — J302 Other seasonal allergic rhinitis: ICD-10-CM

## 2018-01-30 DIAGNOSIS — I10 Essential (primary) hypertension: ICD-10-CM

## 2018-01-30 DIAGNOSIS — I2510 Atherosclerotic heart disease of native coronary artery without angina pectoris: Secondary | ICD-10-CM

## 2018-01-30 DIAGNOSIS — I509 Heart failure, unspecified: Principal | ICD-10-CM

## 2018-01-31 ENCOUNTER — Ambulatory Visit: Admit: 2018-01-31 | Discharge: 2018-01-31 | Payer: MEDICARE

## 2018-01-31 ENCOUNTER — Encounter: Admit: 2018-01-31 | Discharge: 2018-01-31 | Payer: MEDICARE

## 2018-01-31 DIAGNOSIS — M954 Acquired deformity of chest and rib: ICD-10-CM

## 2018-01-31 DIAGNOSIS — N183 Chronic kidney disease, stage 3 (moderate): ICD-10-CM

## 2018-01-31 DIAGNOSIS — L03116 Cellulitis of left lower limb: ICD-10-CM

## 2018-01-31 DIAGNOSIS — M7918 Myalgia, other site: ICD-10-CM

## 2018-01-31 DIAGNOSIS — K219 Gastro-esophageal reflux disease without esophagitis: ICD-10-CM

## 2018-01-31 DIAGNOSIS — M961 Postlaminectomy syndrome, not elsewhere classified: ICD-10-CM

## 2018-01-31 DIAGNOSIS — J189 Pneumonia, unspecified organism: ICD-10-CM

## 2018-01-31 DIAGNOSIS — J479 Bronchiectasis, uncomplicated: ICD-10-CM

## 2018-01-31 DIAGNOSIS — J45909 Unspecified asthma, uncomplicated: ICD-10-CM

## 2018-01-31 DIAGNOSIS — I509 Heart failure, unspecified: Principal | ICD-10-CM

## 2018-01-31 DIAGNOSIS — M545 Low back pain: ICD-10-CM

## 2018-01-31 DIAGNOSIS — G4733 Obstructive sleep apnea (adult) (pediatric): ICD-10-CM

## 2018-01-31 DIAGNOSIS — N401 Enlarged prostate with lower urinary tract symptoms: ICD-10-CM

## 2018-01-31 DIAGNOSIS — I10 Essential (primary) hypertension: ICD-10-CM

## 2018-01-31 DIAGNOSIS — R600 Localized edema: Principal | ICD-10-CM

## 2018-01-31 DIAGNOSIS — G5603 Carpal tunnel syndrome, bilateral upper limbs: ICD-10-CM

## 2018-01-31 DIAGNOSIS — G5622 Lesion of ulnar nerve, left upper limb: Principal | ICD-10-CM

## 2018-01-31 DIAGNOSIS — I714 Abdominal aortic aneurysm, without rupture: ICD-10-CM

## 2018-01-31 DIAGNOSIS — Z9981 Dependence on supplemental oxygen: ICD-10-CM

## 2018-01-31 DIAGNOSIS — E782 Mixed hyperlipidemia: ICD-10-CM

## 2018-01-31 DIAGNOSIS — I5042 Chronic combined systolic (congestive) and diastolic (congestive) heart failure: ICD-10-CM

## 2018-01-31 DIAGNOSIS — R05 Cough: ICD-10-CM

## 2018-01-31 DIAGNOSIS — I2510 Atherosclerotic heart disease of native coronary artery without angina pectoris: ICD-10-CM

## 2018-01-31 DIAGNOSIS — R609 Edema, unspecified: ICD-10-CM

## 2018-01-31 DIAGNOSIS — R0600 Dyspnea, unspecified: ICD-10-CM

## 2018-01-31 DIAGNOSIS — I429 Cardiomyopathy, unspecified: ICD-10-CM

## 2018-01-31 DIAGNOSIS — Z8679 Personal history of other diseases of the circulatory system: ICD-10-CM

## 2018-01-31 DIAGNOSIS — J302 Other seasonal allergic rhinitis: ICD-10-CM

## 2018-01-31 DIAGNOSIS — J449 Chronic obstructive pulmonary disease, unspecified: ICD-10-CM

## 2018-01-31 DIAGNOSIS — Z8614 Personal history of Methicillin resistant Staphylococcus aureus infection: ICD-10-CM

## 2018-01-31 DIAGNOSIS — Z95828 Presence of other vascular implants and grafts: ICD-10-CM

## 2018-01-31 MED ORDER — CEPHALEXIN 500 MG PO CAP
500 mg | ORAL_CAPSULE | Freq: Four times a day (QID) | ORAL | 0 refills | Status: AC
Start: 2018-01-31 — End: 2018-02-05

## 2018-01-31 MED ORDER — LIDOCAINE HCL 10 MG/ML (1 %) IJ SOLN
10 mL | Freq: Once | INTRAMUSCULAR | 0 refills | Status: CP
Start: 2018-01-31 — End: ?
  Administered 2018-01-31: 17:00:00 10 mL via INTRAMUSCULAR

## 2018-02-01 ENCOUNTER — Ambulatory Visit: Admit: 2018-02-01 | Discharge: 2018-02-01 | Payer: MEDICARE

## 2018-02-01 ENCOUNTER — Encounter: Admit: 2018-02-01 | Discharge: 2018-02-01 | Payer: MEDICARE

## 2018-02-01 DIAGNOSIS — L03116 Cellulitis of left lower limb: ICD-10-CM

## 2018-02-01 DIAGNOSIS — I429 Cardiomyopathy, unspecified: ICD-10-CM

## 2018-02-01 DIAGNOSIS — G4733 Obstructive sleep apnea (adult) (pediatric): ICD-10-CM

## 2018-02-01 DIAGNOSIS — Z9981 Dependence on supplemental oxygen: ICD-10-CM

## 2018-02-01 DIAGNOSIS — M954 Acquired deformity of chest and rib: ICD-10-CM

## 2018-02-01 DIAGNOSIS — I10 Essential (primary) hypertension: ICD-10-CM

## 2018-02-01 DIAGNOSIS — I714 Abdominal aortic aneurysm, without rupture: ICD-10-CM

## 2018-02-01 DIAGNOSIS — I5042 Chronic combined systolic (congestive) and diastolic (congestive) heart failure: ICD-10-CM

## 2018-02-01 DIAGNOSIS — J189 Pneumonia, unspecified organism: ICD-10-CM

## 2018-02-01 DIAGNOSIS — K219 Gastro-esophageal reflux disease without esophagitis: ICD-10-CM

## 2018-02-01 DIAGNOSIS — Z8614 Personal history of Methicillin resistant Staphylococcus aureus infection: ICD-10-CM

## 2018-02-01 DIAGNOSIS — N183 Chronic kidney disease, stage 3 (moderate): ICD-10-CM

## 2018-02-01 DIAGNOSIS — E782 Mixed hyperlipidemia: ICD-10-CM

## 2018-02-01 DIAGNOSIS — N401 Enlarged prostate with lower urinary tract symptoms: ICD-10-CM

## 2018-02-01 DIAGNOSIS — Z95828 Presence of other vascular implants and grafts: ICD-10-CM

## 2018-02-01 DIAGNOSIS — I951 Orthostatic hypotension: ICD-10-CM

## 2018-02-01 DIAGNOSIS — J302 Other seasonal allergic rhinitis: ICD-10-CM

## 2018-02-01 DIAGNOSIS — J479 Bronchiectasis, uncomplicated: ICD-10-CM

## 2018-02-01 DIAGNOSIS — R05 Cough: ICD-10-CM

## 2018-02-01 DIAGNOSIS — R0600 Dyspnea, unspecified: ICD-10-CM

## 2018-02-01 DIAGNOSIS — I2510 Atherosclerotic heart disease of native coronary artery without angina pectoris: Secondary | ICD-10-CM

## 2018-02-01 DIAGNOSIS — I509 Heart failure, unspecified: Principal | ICD-10-CM

## 2018-02-01 DIAGNOSIS — M961 Postlaminectomy syndrome, not elsewhere classified: ICD-10-CM

## 2018-02-01 DIAGNOSIS — J449 Chronic obstructive pulmonary disease, unspecified: ICD-10-CM

## 2018-02-01 DIAGNOSIS — Z8679 Personal history of other diseases of the circulatory system: ICD-10-CM

## 2018-02-01 DIAGNOSIS — J45909 Unspecified asthma, uncomplicated: ICD-10-CM

## 2018-02-01 DIAGNOSIS — R609 Edema, unspecified: ICD-10-CM

## 2018-02-01 LAB — BASIC METABOLIC PANEL
Lab: 1.2 mg/dL (ref 0.4–1.24)
Lab: 136 MMOL/L — ABNORMAL LOW (ref 137–147)
Lab: 22 mg/dL (ref 7–25)
Lab: 3.8 MMOL/L (ref 3.5–5.1)
Lab: 57 mL/min — ABNORMAL LOW (ref >60)
Lab: 87 mg/dL (ref 70–100)
Lab: 9.6 mg/dL (ref 8.5–10.6)
Lab: >60 mL/min (ref >60)

## 2018-02-01 LAB — KAPPA/LAMBDA FREE LIGHT CHAINS
Lab: 0.9 (ref 0.26–1.65)
Lab: 3.5 mg/dL — ABNORMAL HIGH (ref 0.33–1.94)
Lab: 3.6 mg/dL — ABNORMAL HIGH (ref 0.57–2.63)

## 2018-02-04 ENCOUNTER — Encounter: Admit: 2018-02-04 | Discharge: 2018-02-04 | Payer: MEDICARE

## 2018-02-04 DIAGNOSIS — S43004S Unspecified dislocation of right shoulder joint, sequela: Principal | ICD-10-CM

## 2018-02-05 ENCOUNTER — Encounter: Admit: 2018-02-05 | Discharge: 2018-02-05 | Payer: MEDICARE

## 2018-02-05 DIAGNOSIS — L03116 Cellulitis of left lower limb: Principal | ICD-10-CM

## 2018-02-05 LAB — ELECTROPHORESIS-SERUM PROTEIN
Lab: 11 % (ref 9–21)
Lab: 14 % (ref 9–17)
Lab: 16 % — ABNORMAL HIGH (ref 5–15)
Lab: 49 % (ref 48–68)
Lab: 6.3 g/dL (ref 6.0–8.0)
Lab: 8.2 % — ABNORMAL HIGH (ref 2–6)

## 2018-02-05 MED ORDER — SULFAMETHOXAZOLE-TRIMETHOPRIM 800-160 MG PO TAB
1 | ORAL_TABLET | Freq: Two times a day (BID) | ORAL | 0 refills | Status: AC
Start: 2018-02-05 — End: ?

## 2018-02-06 ENCOUNTER — Ambulatory Visit: Admit: 2018-02-06 | Discharge: 2018-02-06 | Payer: MEDICARE

## 2018-02-06 ENCOUNTER — Encounter: Admit: 2018-02-06 | Discharge: 2018-02-06 | Payer: MEDICARE

## 2018-02-06 DIAGNOSIS — I10 Essential (primary) hypertension: ICD-10-CM

## 2018-02-06 DIAGNOSIS — R05 Cough: ICD-10-CM

## 2018-02-06 DIAGNOSIS — J189 Pneumonia, unspecified organism: ICD-10-CM

## 2018-02-06 DIAGNOSIS — J479 Bronchiectasis, uncomplicated: ICD-10-CM

## 2018-02-06 DIAGNOSIS — S43004S Unspecified dislocation of right shoulder joint, sequela: Principal | ICD-10-CM

## 2018-02-06 DIAGNOSIS — J45909 Unspecified asthma, uncomplicated: ICD-10-CM

## 2018-02-06 DIAGNOSIS — M961 Postlaminectomy syndrome, not elsewhere classified: ICD-10-CM

## 2018-02-06 DIAGNOSIS — R0600 Dyspnea, unspecified: ICD-10-CM

## 2018-02-06 DIAGNOSIS — G5603 Carpal tunnel syndrome, bilateral upper limbs: ICD-10-CM

## 2018-02-06 DIAGNOSIS — Z8679 Personal history of other diseases of the circulatory system: ICD-10-CM

## 2018-02-06 DIAGNOSIS — J302 Other seasonal allergic rhinitis: ICD-10-CM

## 2018-02-06 DIAGNOSIS — M954 Acquired deformity of chest and rib: ICD-10-CM

## 2018-02-06 DIAGNOSIS — I509 Heart failure, unspecified: Principal | ICD-10-CM

## 2018-02-06 DIAGNOSIS — L03116 Cellulitis of left lower limb: ICD-10-CM

## 2018-02-06 DIAGNOSIS — M19049 Primary osteoarthritis, unspecified hand: ICD-10-CM

## 2018-02-06 DIAGNOSIS — I255 Ischemic cardiomyopathy: Principal | ICD-10-CM

## 2018-02-06 DIAGNOSIS — Z9981 Dependence on supplemental oxygen: ICD-10-CM

## 2018-02-06 DIAGNOSIS — K219 Gastro-esophageal reflux disease without esophagitis: ICD-10-CM

## 2018-02-06 DIAGNOSIS — J449 Chronic obstructive pulmonary disease, unspecified: ICD-10-CM

## 2018-02-06 DIAGNOSIS — Z8614 Personal history of Methicillin resistant Staphylococcus aureus infection: ICD-10-CM

## 2018-02-06 DIAGNOSIS — E782 Mixed hyperlipidemia: ICD-10-CM

## 2018-02-06 DIAGNOSIS — I714 Abdominal aortic aneurysm, without rupture: ICD-10-CM

## 2018-02-06 DIAGNOSIS — I5042 Chronic combined systolic (congestive) and diastolic (congestive) heart failure: ICD-10-CM

## 2018-02-06 DIAGNOSIS — N183 Chronic kidney disease, stage 3 (moderate): ICD-10-CM

## 2018-02-06 DIAGNOSIS — I2510 Atherosclerotic heart disease of native coronary artery without angina pectoris: Secondary | ICD-10-CM

## 2018-02-06 DIAGNOSIS — Z95828 Presence of other vascular implants and grafts: ICD-10-CM

## 2018-02-06 DIAGNOSIS — I429 Cardiomyopathy, unspecified: ICD-10-CM

## 2018-02-06 DIAGNOSIS — N401 Enlarged prostate with lower urinary tract symptoms: ICD-10-CM

## 2018-02-06 DIAGNOSIS — R609 Edema, unspecified: ICD-10-CM

## 2018-02-06 DIAGNOSIS — G4733 Obstructive sleep apnea (adult) (pediatric): ICD-10-CM

## 2018-02-07 ENCOUNTER — Encounter: Admit: 2018-02-07 | Discharge: 2018-02-07 | Payer: MEDICARE

## 2018-02-08 ENCOUNTER — Encounter: Admit: 2018-02-08 | Discharge: 2018-02-08 | Payer: MEDICARE

## 2018-02-08 DIAGNOSIS — G5622 Lesion of ulnar nerve, left upper limb: Principal | ICD-10-CM

## 2018-02-08 MED ORDER — CEFAZOLIN INJ 1GM IVP
2 g | Freq: Once | INTRAVENOUS | 0 refills | Status: CN
Start: 2018-02-08 — End: ?

## 2018-02-12 ENCOUNTER — Encounter: Admit: 2018-02-12 | Discharge: 2018-02-12 | Payer: MEDICARE

## 2018-02-12 MED ORDER — ALLOPURINOL 300 MG PO TAB
300 mg | ORAL_TABLET | Freq: Every day | ORAL | 1 refills | 60.00000 days | Status: AC
Start: 2018-02-12 — End: 2018-08-30

## 2018-02-14 ENCOUNTER — Ambulatory Visit: Admit: 2018-02-14 | Discharge: 2018-02-14 | Payer: MEDICARE

## 2018-02-14 DIAGNOSIS — I5042 Chronic combined systolic (congestive) and diastolic (congestive) heart failure: Principal | ICD-10-CM

## 2018-02-15 ENCOUNTER — Encounter: Admit: 2018-02-15 | Discharge: 2018-02-15 | Payer: MEDICARE

## 2018-02-15 ENCOUNTER — Ambulatory Visit: Admit: 2018-02-15 | Discharge: 2018-02-16 | Payer: MEDICARE

## 2018-02-15 DIAGNOSIS — J479 Bronchiectasis, uncomplicated: ICD-10-CM

## 2018-02-15 DIAGNOSIS — M954 Acquired deformity of chest and rib: ICD-10-CM

## 2018-02-15 DIAGNOSIS — I429 Cardiomyopathy, unspecified: ICD-10-CM

## 2018-02-15 DIAGNOSIS — I714 Abdominal aortic aneurysm, without rupture: ICD-10-CM

## 2018-02-15 DIAGNOSIS — G4733 Obstructive sleep apnea (adult) (pediatric): ICD-10-CM

## 2018-02-15 DIAGNOSIS — N401 Enlarged prostate with lower urinary tract symptoms: ICD-10-CM

## 2018-02-15 DIAGNOSIS — Z8614 Personal history of Methicillin resistant Staphylococcus aureus infection: ICD-10-CM

## 2018-02-15 DIAGNOSIS — M961 Postlaminectomy syndrome, not elsewhere classified: ICD-10-CM

## 2018-02-15 DIAGNOSIS — R05 Cough: ICD-10-CM

## 2018-02-15 DIAGNOSIS — F419 Anxiety disorder, unspecified: Secondary | ICD-10-CM

## 2018-02-15 DIAGNOSIS — J45909 Unspecified asthma, uncomplicated: ICD-10-CM

## 2018-02-15 DIAGNOSIS — I509 Heart failure, unspecified: Principal | ICD-10-CM

## 2018-02-15 DIAGNOSIS — J302 Other seasonal allergic rhinitis: ICD-10-CM

## 2018-02-15 DIAGNOSIS — Z9981 Dependence on supplemental oxygen: ICD-10-CM

## 2018-02-15 DIAGNOSIS — J449 Chronic obstructive pulmonary disease, unspecified: ICD-10-CM

## 2018-02-15 DIAGNOSIS — Z8679 Personal history of other diseases of the circulatory system: ICD-10-CM

## 2018-02-15 DIAGNOSIS — R609 Edema, unspecified: ICD-10-CM

## 2018-02-15 DIAGNOSIS — Z95828 Presence of other vascular implants and grafts: ICD-10-CM

## 2018-02-15 DIAGNOSIS — J189 Pneumonia, unspecified organism: ICD-10-CM

## 2018-02-15 DIAGNOSIS — E782 Mixed hyperlipidemia: ICD-10-CM

## 2018-02-15 DIAGNOSIS — R0600 Dyspnea, unspecified: ICD-10-CM

## 2018-02-15 DIAGNOSIS — I5042 Chronic combined systolic (congestive) and diastolic (congestive) heart failure: ICD-10-CM

## 2018-02-15 DIAGNOSIS — I10 Essential (primary) hypertension: ICD-10-CM

## 2018-02-15 DIAGNOSIS — L03116 Cellulitis of left lower limb: ICD-10-CM

## 2018-02-15 DIAGNOSIS — N183 Chronic kidney disease, stage 3 (moderate): ICD-10-CM

## 2018-02-15 DIAGNOSIS — I2510 Atherosclerotic heart disease of native coronary artery without angina pectoris: Secondary | ICD-10-CM

## 2018-02-15 DIAGNOSIS — K219 Gastro-esophageal reflux disease without esophagitis: ICD-10-CM

## 2018-02-15 MED ORDER — VANCOMYCIN 1,000 MG IV SOLR
15 mg/kg | Freq: Two times a day (BID) | INTRAVENOUS | 0 refills | 30.00000 days | Status: AC
Start: 2018-02-15 — End: 2018-02-18

## 2018-02-15 MED ORDER — VANCOMYCIN 1,000 MG IV SOLR
15 mg/kg | Freq: Two times a day (BID) | INTRAVENOUS | 0 refills | 30.00000 days | Status: AC
Start: 2018-02-15 — End: 2018-02-15

## 2018-02-15 MED ORDER — SULFAMETHOXAZOLE-TRIMETHOPRIM 800-160 MG PO TAB
1 | ORAL_TABLET | Freq: Two times a day (BID) | ORAL | 0 refills | Status: AC
Start: 2018-02-15 — End: ?

## 2018-02-16 DIAGNOSIS — J479 Bronchiectasis, uncomplicated: ICD-10-CM

## 2018-02-16 DIAGNOSIS — R110 Nausea: ICD-10-CM

## 2018-02-16 DIAGNOSIS — L03116 Cellulitis of left lower limb: Principal | ICD-10-CM

## 2018-02-16 DIAGNOSIS — Z79899 Other long term (current) drug therapy: ICD-10-CM

## 2018-02-18 ENCOUNTER — Encounter: Admit: 2018-02-18 | Discharge: 2018-02-18 | Payer: MEDICARE

## 2018-02-18 DIAGNOSIS — L03116 Cellulitis of left lower limb: Principal | ICD-10-CM

## 2018-02-18 MED ORDER — VANCOMYCIN 1,750 MG IVPB
1750 mg | Freq: Two times a day (BID) | INTRAVENOUS | 11 refills | 10.00000 days | Status: AC
Start: 2018-02-18 — End: 2018-02-18

## 2018-02-18 MED ORDER — VANCOMYCIN 1,750 MG IVPB
1750 mg | INTRAVENOUS | 0 refills | 10.00000 days | Status: AC
Start: 2018-02-18 — End: 2018-02-21

## 2018-02-21 ENCOUNTER — Encounter: Admit: 2018-02-21 | Discharge: 2018-02-21 | Payer: MEDICARE

## 2018-02-21 DIAGNOSIS — L03116 Cellulitis of left lower limb: Secondary | ICD-10-CM

## 2018-02-21 MED ORDER — VANCOMYCIN 1,500 MG IVPB
1500 mg | INTRAVENOUS | 0 refills | 10.00000 days | Status: AC
Start: 2018-02-21 — End: ?

## 2018-02-22 ENCOUNTER — Encounter: Admit: 2018-02-22 | Discharge: 2018-02-22 | Payer: MEDICARE

## 2018-02-22 ENCOUNTER — Encounter: Admit: 2018-02-22 | Discharge: 2018-02-23 | Payer: MEDICARE

## 2018-02-22 DIAGNOSIS — J159 Unspecified bacterial pneumonia: Secondary | ICD-10-CM

## 2018-02-22 MED ORDER — LEVOFLOXACIN 750 MG PO TAB
750 mg | ORAL_TABLET | Freq: Every day | ORAL | 0 refills | 7.00000 days | Status: AC
Start: 2018-02-22 — End: 2018-02-27

## 2018-02-22 MED ORDER — PREDNISONE 20 MG PO TAB
40 mg | ORAL_TABLET | Freq: Every day | ORAL | 0 refills | Status: AC
Start: 2018-02-22 — End: 2018-02-27

## 2018-02-25 ENCOUNTER — Encounter: Admit: 2018-02-25 | Discharge: 2018-02-25 | Payer: MEDICARE

## 2018-02-25 MED ORDER — PANTOPRAZOLE 40 MG PO TBEC
ORAL_TABLET | Freq: Two times a day (BID) | ORAL | 0 refills | 90.00000 days | Status: AC
Start: 2018-02-25 — End: 2018-05-17

## 2018-02-25 MED ORDER — BUMETANIDE 2 MG PO TAB
2 mg | ORAL_TABLET | Freq: Two times a day (BID) | ORAL | 5 refills | Status: AC
Start: 2018-02-25 — End: 2018-03-04

## 2018-02-26 ENCOUNTER — Encounter: Admit: 2018-02-26 | Discharge: 2018-02-26 | Payer: MEDICARE

## 2018-02-27 ENCOUNTER — Encounter: Admit: 2018-02-27 | Discharge: 2018-02-27 | Payer: MEDICARE

## 2018-02-27 ENCOUNTER — Ambulatory Visit: Admit: 2018-02-27 | Discharge: 2018-02-27 | Payer: MEDICARE

## 2018-02-27 DIAGNOSIS — L03116 Cellulitis of left lower limb: Secondary | ICD-10-CM

## 2018-02-27 DIAGNOSIS — J479 Bronchiectasis, uncomplicated: Secondary | ICD-10-CM

## 2018-02-27 DIAGNOSIS — R0600 Dyspnea, unspecified: Secondary | ICD-10-CM

## 2018-02-27 DIAGNOSIS — I10 Essential (primary) hypertension: Secondary | ICD-10-CM

## 2018-02-27 DIAGNOSIS — I5023 Acute on chronic systolic (congestive) heart failure: ICD-10-CM

## 2018-02-27 DIAGNOSIS — I714 Abdominal aortic aneurysm, without rupture: Secondary | ICD-10-CM

## 2018-02-27 DIAGNOSIS — I509 Heart failure, unspecified: Secondary | ICD-10-CM

## 2018-02-27 DIAGNOSIS — Z8614 Personal history of Methicillin resistant Staphylococcus aureus infection: Secondary | ICD-10-CM

## 2018-02-27 DIAGNOSIS — K219 Gastro-esophageal reflux disease without esophagitis: Secondary | ICD-10-CM

## 2018-02-27 DIAGNOSIS — G4733 Obstructive sleep apnea (adult) (pediatric): Secondary | ICD-10-CM

## 2018-02-27 DIAGNOSIS — R600 Localized edema: ICD-10-CM

## 2018-02-27 DIAGNOSIS — I2510 Atherosclerotic heart disease of native coronary artery without angina pectoris: Secondary | ICD-10-CM

## 2018-02-27 DIAGNOSIS — N401 Enlarged prostate with lower urinary tract symptoms: Secondary | ICD-10-CM

## 2018-02-27 DIAGNOSIS — Z8679 Personal history of other diseases of the circulatory system: Secondary | ICD-10-CM

## 2018-02-27 DIAGNOSIS — J069 Acute upper respiratory infection, unspecified: ICD-10-CM

## 2018-02-27 DIAGNOSIS — E782 Mixed hyperlipidemia: Secondary | ICD-10-CM

## 2018-02-27 DIAGNOSIS — Z9981 Dependence on supplemental oxygen: Secondary | ICD-10-CM

## 2018-02-27 DIAGNOSIS — I429 Cardiomyopathy, unspecified: Secondary | ICD-10-CM

## 2018-02-27 DIAGNOSIS — M961 Postlaminectomy syndrome, not elsewhere classified: Secondary | ICD-10-CM

## 2018-02-27 DIAGNOSIS — J189 Pneumonia, unspecified organism: Secondary | ICD-10-CM

## 2018-02-27 DIAGNOSIS — N183 Chronic kidney disease, stage 3 (moderate): Secondary | ICD-10-CM

## 2018-02-27 DIAGNOSIS — J45909 Unspecified asthma, uncomplicated: Secondary | ICD-10-CM

## 2018-02-27 DIAGNOSIS — I5042 Chronic combined systolic (congestive) and diastolic (congestive) heart failure: ICD-10-CM

## 2018-02-27 DIAGNOSIS — J302 Other seasonal allergic rhinitis: Secondary | ICD-10-CM

## 2018-02-27 DIAGNOSIS — R05 Cough: Secondary | ICD-10-CM

## 2018-02-27 DIAGNOSIS — J449 Chronic obstructive pulmonary disease, unspecified: Secondary | ICD-10-CM

## 2018-02-27 DIAGNOSIS — R609 Edema, unspecified: Secondary | ICD-10-CM

## 2018-02-27 DIAGNOSIS — M954 Acquired deformity of chest and rib: Secondary | ICD-10-CM

## 2018-02-27 DIAGNOSIS — Z95828 Presence of other vascular implants and grafts: Secondary | ICD-10-CM

## 2018-02-27 MED ORDER — MIDODRINE 5 MG PO TAB
5 mg | Freq: Two times a day (BID) | ORAL | 0 refills | Status: DC
Start: 2018-02-27 — End: 2018-03-04
  Administered 2018-02-27 – 2018-03-04 (×10): 5 mg via ORAL

## 2018-02-27 MED ORDER — BUMETANIDE 1 MG PO TAB
2 mg | Freq: Two times a day (BID) | ORAL | 0 refills | Status: DC
Start: 2018-02-27 — End: 2018-02-28
  Administered 2018-02-28 (×2): 2 mg via ORAL

## 2018-02-27 MED ORDER — ONDANSETRON HCL (PF) 4 MG/2 ML IJ SOLN
4 mg | INTRAVENOUS | 0 refills | Status: DC | PRN
Start: 2018-02-27 — End: 2018-03-04

## 2018-02-27 MED ORDER — IPRATROPIUM BROMIDE 0.02 % IN SOLN
.5 mg | RESPIRATORY_TRACT | 0 refills | Status: DC | PRN
Start: 2018-02-27 — End: 2018-03-02

## 2018-02-27 MED ORDER — MONTELUKAST 10 MG PO TAB
10 mg | Freq: Every evening | ORAL | 0 refills | Status: DC
Start: 2018-02-27 — End: 2018-03-04
  Administered 2018-02-28 – 2018-03-04 (×5): 10 mg via ORAL

## 2018-02-27 MED ORDER — ENOXAPARIN 40 MG/0.4 ML SC SYRG
40 mg | Freq: Every day | SUBCUTANEOUS | 0 refills | Status: DC
Start: 2018-02-27 — End: 2018-03-04
  Administered 2018-03-01 – 2018-03-04 (×4): 40 mg via SUBCUTANEOUS

## 2018-02-27 MED ORDER — BUDESONIDE-FORMOTEROL 160-4.5 MCG/ACTUATION IN HFAA
2 | Freq: Two times a day (BID) | RESPIRATORY_TRACT | 0 refills | Status: DC
Start: 2018-02-27 — End: 2018-03-04
  Administered 2018-02-28: 2 via RESPIRATORY_TRACT

## 2018-02-27 MED ORDER — IPRATROPIUM-ALBUTEROL 20-100 MCG/ACTUATION IN MIST
1 | Freq: Four times a day (QID) | RESPIRATORY_TRACT | 0 refills | Status: DC
Start: 2018-02-27 — End: 2018-03-04
  Administered 2018-02-28: 1 via RESPIRATORY_TRACT

## 2018-02-27 MED ORDER — ALBUTEROL SULFATE 2.5 MG/0.5 ML IN NEBU
2.5 mg | RESPIRATORY_TRACT | 0 refills | Status: DC | PRN
Start: 2018-02-27 — End: 2018-03-02

## 2018-02-27 MED ORDER — ATORVASTATIN 40 MG PO TAB
40 mg | Freq: Every day | ORAL | 0 refills | Status: DC
Start: 2018-02-27 — End: 2018-03-04
  Administered 2018-02-27 – 2018-03-04 (×6): 40 mg via ORAL

## 2018-02-27 MED ORDER — ALLOPURINOL 300 MG PO TAB
300 mg | Freq: Every day | ORAL | 0 refills | Status: DC
Start: 2018-02-27 — End: 2018-03-04
  Administered 2018-02-28 – 2018-03-04 (×5): 300 mg via ORAL

## 2018-02-27 MED ORDER — BISACODYL 10 MG RE SUPP
10 mg | Freq: Every day | RECTAL | 0 refills | Status: DC | PRN
Start: 2018-02-27 — End: 2018-03-04

## 2018-02-27 MED ORDER — BENZONATATE 100 MG PO CAP
200 mg | ORAL | 0 refills | Status: DC | PRN
Start: 2018-02-27 — End: 2018-03-04

## 2018-02-27 MED ORDER — PANTOPRAZOLE 40 MG PO TBEC
40 mg | Freq: Every day | ORAL | 0 refills | Status: DC
Start: 2018-02-27 — End: 2018-03-04
  Administered 2018-02-28 – 2018-03-04 (×5): 40 mg via ORAL

## 2018-02-27 MED ORDER — CLOPIDOGREL 75 MG PO TAB
75 mg | Freq: Every day | ORAL | 0 refills | Status: DC
Start: 2018-02-27 — End: 2018-03-04
  Administered 2018-02-27 – 2018-03-04 (×6): 75 mg via ORAL

## 2018-02-27 MED ORDER — ONDANSETRON HCL 4 MG PO TAB
4 mg | ORAL | 0 refills | Status: DC | PRN
Start: 2018-02-27 — End: 2018-03-04

## 2018-02-27 MED ORDER — FINASTERIDE 5 MG PO TAB
5 mg | Freq: Every day | ORAL | 0 refills | Status: DC
Start: 2018-02-27 — End: 2018-03-04
  Administered 2018-02-27 – 2018-03-04 (×6): 5 mg via ORAL

## 2018-02-27 MED ORDER — GABAPENTIN 100 MG PO CAP
100 mg | Freq: Two times a day (BID) | ORAL | 0 refills | Status: DC
Start: 2018-02-27 — End: 2018-03-04
  Administered 2018-02-28 – 2018-03-04 (×9): 100 mg via ORAL

## 2018-02-27 MED ORDER — POLYETHYLENE GLYCOL 3350 17 GRAM PO PWPK
1 | Freq: Every day | ORAL | 0 refills | Status: DC | PRN
Start: 2018-02-27 — End: 2018-03-04

## 2018-02-27 MED ORDER — ALBUTEROL SULFATE 2.5 MG/0.5 ML IN NEBU
2.5 mg | RESPIRATORY_TRACT | 0 refills | Status: DC | PRN
Start: 2018-02-27 — End: 2018-02-28

## 2018-02-27 MED ORDER — IPRATROPIUM BROMIDE 0.02 % IN SOLN
.5 mg | RESPIRATORY_TRACT | 0 refills | Status: DC | PRN
Start: 2018-02-27 — End: 2018-02-28

## 2018-02-27 MED ORDER — DULOXETINE 60 MG PO CPDR
60 mg | Freq: Every day | ORAL | 0 refills | Status: DC
Start: 2018-02-27 — End: 2018-03-04
  Administered 2018-02-27 – 2018-03-04 (×6): 60 mg via ORAL

## 2018-02-27 MED ORDER — MELATONIN 3 MG PO TAB
3 mg | Freq: Every evening | ORAL | 0 refills | Status: DC | PRN
Start: 2018-02-27 — End: 2018-03-04
  Administered 2018-03-01 – 2018-03-04 (×4): 3 mg via ORAL

## 2018-02-27 MED ORDER — ACETAMINOPHEN 325 MG PO TAB
650 mg | ORAL | 0 refills | Status: DC | PRN
Start: 2018-02-27 — End: 2018-03-04
  Administered 2018-02-28 – 2018-03-04 (×4): 650 mg via ORAL

## 2018-02-27 MED ORDER — ASPIRIN 81 MG PO TBEC
81 mg | Freq: Every day | ORAL | 0 refills | Status: DC
Start: 2018-02-27 — End: 2018-03-04
  Administered 2018-02-28 – 2018-03-04 (×5): 81 mg via ORAL

## 2018-02-27 MED ORDER — SPIRONOLACTONE 25 MG PO TAB
25 mg | Freq: Every day | ORAL | 0 refills | Status: DC
Start: 2018-02-27 — End: 2018-03-04
  Administered 2018-02-28 – 2018-03-04 (×5): 25 mg via ORAL

## 2018-02-27 MED ORDER — TRAMADOL 50 MG PO TAB
50 mg | ORAL | 0 refills | Status: DC | PRN
Start: 2018-02-27 — End: 2018-03-04
  Administered 2018-02-28 – 2018-03-04 (×4): 50 mg via ORAL

## 2018-02-28 LAB — COMPREHENSIVE METABOLIC PANEL
Lab: 0.5 mg/dL (ref 0.3–1.2)
Lab: 1.2 mg/dL (ref 0.4–1.24)
Lab: 10 (ref 3–12)
Lab: 107 MMOL/L — ABNORMAL LOW (ref 98–110)
Lab: 143 MMOL/L — ABNORMAL LOW (ref >60)
Lab: 15 U/L (ref 7–56)
Lab: 17 U/L (ref 7–40)
Lab: 26 MMOL/L (ref 21–30)
Lab: 3.3 g/dL — ABNORMAL LOW (ref 3.5–5.0)
Lab: 32 mg/dL — ABNORMAL HIGH (ref 7–25)
Lab: 5.8 g/dL — ABNORMAL LOW (ref 6.0–8.0)
Lab: 58 mL/min — ABNORMAL LOW (ref >60)
Lab: 8.4 mg/dL — ABNORMAL LOW (ref 8.5–10.6)
Lab: 84 U/L (ref 25–110)
Lab: 95 mg/dL — ABNORMAL HIGH (ref 70–100)
Lab: >60 mL/min (ref >60)
Lab: 139 MMOL/L — ABNORMAL HIGH (ref 137–147)

## 2018-02-28 LAB — RVP VIRAL PANEL PCR

## 2018-02-28 LAB — CBC
Lab: 8 K/UL (ref 4.5–11.0)
Lab: 7.6 K/UL — ABNORMAL LOW (ref 4.5–11.0)

## 2018-02-28 LAB — SED RATE: Lab: 39 mm/h — ABNORMAL HIGH (ref 0–20)

## 2018-02-28 LAB — C REACTIVE PROTEIN (CRP): Lab: 0.6 mg/dL (ref <1.0)

## 2018-02-28 LAB — BNP (B-TYPE NATRIURETIC PEPTI): Lab: 165 pg/mL — ABNORMAL HIGH (ref 0–100)

## 2018-02-28 LAB — MAGNESIUM: Lab: 1.6 mg/dL (ref 1.6–2.6)

## 2018-02-28 LAB — TROPONIN-I: Lab: 0 ng/mL — ABNORMAL LOW (ref >60)

## 2018-02-28 MED ORDER — POTASSIUM CHLORIDE 20 MEQ PO TBTQ
40 meq | Freq: Once | ORAL | 0 refills | Status: CP
Start: 2018-02-28 — End: ?
  Administered 2018-02-28: 18:00:00 40 meq via ORAL

## 2018-02-28 MED ORDER — ZINC OXIDE 20 % TP OINT
Freq: Two times a day (BID) | TOPICAL | 0 refills | Status: DC
Start: 2018-02-28 — End: 2018-03-04
  Administered 2018-02-28 – 2018-03-01 (×3): via TOPICAL

## 2018-02-28 MED ORDER — BUMETANIDE 0.25 MG/ML IJ SOLN
2 mg | Freq: Two times a day (BID) | INTRAVENOUS | 0 refills | Status: DC
Start: 2018-02-28 — End: 2018-02-28
  Administered 2018-02-28: 17:00:00 2 mg via INTRAVENOUS

## 2018-02-28 MED ORDER — BUMETANIDE 0.25 MG/ML IJ SOLN
4 mg | Freq: Two times a day (BID) | INTRAVENOUS | 0 refills | Status: DC
Start: 2018-02-28 — End: 2018-03-01
  Administered 2018-02-28 – 2018-03-01 (×2): 4 mg via INTRAVENOUS

## 2018-02-28 MED ORDER — MAGNESIUM SULFATE IN D5W 1 GRAM/100 ML IV PGBK
1 g | INTRAVENOUS | 0 refills | Status: CP
Start: 2018-02-28 — End: ?
  Administered 2018-02-28 – 2018-03-01 (×3): 1 g via INTRAVENOUS

## 2018-02-28 MED ORDER — MAGNESIUM SULFATE IN D5W 1 GRAM/100 ML IV PGBK
1 g | Freq: Once | INTRAVENOUS | 0 refills | Status: CP
Start: 2018-02-28 — End: ?
  Administered 2018-03-01: 05:00:00 1 g via INTRAVENOUS

## 2018-02-28 MED ADMIN — IPRATROPIUM-ALBUTEROL 20-100 MCG/ACTUATION IN MIST [322811]: RESPIRATORY_TRACT | Stop: 2018-02-28 | NDC 00597002402

## 2018-03-01 ENCOUNTER — Encounter: Admit: 2018-03-01 | Discharge: 2018-03-01 | Payer: MEDICARE

## 2018-03-01 LAB — PROCALCITONIN: Lab: 0.2 ng/mL — ABNORMAL HIGH (ref <0.11)

## 2018-03-01 LAB — CBC: Lab: 7.1 K/UL — ABNORMAL HIGH (ref 4.5–11.0)

## 2018-03-01 LAB — MAGNESIUM: Lab: 2.2 mg/dL — ABNORMAL LOW (ref 1.6–2.6)

## 2018-03-01 LAB — COMPREHENSIVE METABOLIC PANEL: Lab: 140 MMOL/L — ABNORMAL LOW (ref >60)

## 2018-03-01 MED ORDER — BUMETANIDE 0.25 MG/ML IJ SOLN
4 mg | Freq: Two times a day (BID) | INTRAVENOUS | 0 refills | Status: CP
Start: 2018-03-01 — End: ?
  Administered 2018-03-01 – 2018-03-02 (×2): 4 mg via INTRAVENOUS

## 2018-03-01 MED ORDER — MULTIVIT-IRON-FA-CALCIUM-MINS 9 MG IRON-400 MCG PO TAB
1 | Freq: Every day | ORAL | 0 refills | Status: DC
Start: 2018-03-01 — End: 2018-03-04
  Administered 2018-03-01 – 2018-03-04 (×4): 1 via ORAL

## 2018-03-02 ENCOUNTER — Encounter: Admit: 2018-03-02 | Discharge: 2018-03-02 | Payer: MEDICARE

## 2018-03-02 LAB — COMPREHENSIVE METABOLIC PANEL: Lab: 139 MMOL/L — ABNORMAL LOW (ref 137–147)

## 2018-03-02 LAB — CBC: Lab: 7.1 K/UL — ABNORMAL LOW (ref >60)

## 2018-03-02 LAB — MAGNESIUM: Lab: 1.9 mg/dL — ABNORMAL LOW (ref >60)

## 2018-03-02 MED ORDER — MAGNESIUM SULFATE IN D5W 1 GRAM/100 ML IV PGBK
1 g | Freq: Once | INTRAVENOUS | 0 refills | Status: CP
Start: 2018-03-02 — End: ?
  Administered 2018-03-02: 16:00:00 1 g via INTRAVENOUS

## 2018-03-02 MED ORDER — BUMETANIDE 0.25 MG/ML IJ SOLN
3 mg | Freq: Two times a day (BID) | INTRAVENOUS | 0 refills | Status: DC
Start: 2018-03-02 — End: 2018-03-03
  Administered 2018-03-02 – 2018-03-03 (×2): 3 mg via INTRAVENOUS

## 2018-03-03 LAB — CBC: Lab: 6.1 K/UL — ABNORMAL LOW (ref 4.5–11.0)

## 2018-03-03 LAB — COMPREHENSIVE METABOLIC PANEL: Lab: 142 MMOL/L — ABNORMAL LOW (ref 137–147)

## 2018-03-03 LAB — MAGNESIUM: Lab: 1.7 mg/dL — ABNORMAL LOW (ref >60)

## 2018-03-03 LAB — URIC ACID: Lab: 7.7 mg/dL (ref 4.0–8.0)

## 2018-03-03 MED ORDER — MAGNESIUM SULFATE IN D5W 1 GRAM/100 ML IV PGBK
1 g | INTRAVENOUS | 0 refills | Status: CP
Start: 2018-03-03 — End: ?
  Administered 2018-03-03 (×3): 1 g via INTRAVENOUS

## 2018-03-03 MED ORDER — BUMETANIDE 1 MG PO TAB
2 mg | Freq: Every day | ORAL | 0 refills | Status: DC
Start: 2018-03-03 — End: 2018-03-04
  Administered 2018-03-03: 22:00:00 2 mg via ORAL

## 2018-03-03 MED ORDER — BUMETANIDE 1 MG PO TAB
3 mg | Freq: Every day | ORAL | 0 refills | Status: DC
Start: 2018-03-03 — End: 2018-03-04
  Administered 2018-03-04: 14:00:00 3 mg via ORAL

## 2018-03-03 MED ADMIN — SODIUM CHLORIDE 0.9 % IV SOLP [27838]: 250 mL | INTRAVENOUS | @ 16:00:00 | Stop: 2018-03-03 | NDC 00338004902

## 2018-03-04 ENCOUNTER — Inpatient Hospital Stay: Admit: 2018-03-02 | Discharge: 2018-03-02 | Payer: MEDICARE

## 2018-03-04 ENCOUNTER — Inpatient Hospital Stay: Admit: 2018-03-03 | Discharge: 2018-03-03 | Payer: MEDICARE

## 2018-03-04 ENCOUNTER — Encounter
Admit: 2018-02-27 | Discharge: 2018-03-04 | Disposition: A | Discharge status: Home / Self Care | Payer: MEDICARE | Source: Ambulatory Visit

## 2018-03-04 ENCOUNTER — Encounter: Admit: 2018-03-04 | Discharge: 2018-03-04 | Payer: MEDICARE

## 2018-03-04 ENCOUNTER — Encounter: Admit: 2018-02-27 | Discharge: 2018-02-27 | Payer: MEDICARE

## 2018-03-04 DIAGNOSIS — Z87891 Personal history of nicotine dependence: Secondary | ICD-10-CM

## 2018-03-04 DIAGNOSIS — Z8679 Personal history of other diseases of the circulatory system: Secondary | ICD-10-CM

## 2018-03-04 DIAGNOSIS — I872 Venous insufficiency (chronic) (peripheral): Secondary | ICD-10-CM

## 2018-03-04 DIAGNOSIS — J9811 Atelectasis: Secondary | ICD-10-CM

## 2018-03-04 DIAGNOSIS — Z95810 Presence of automatic (implantable) cardiac defibrillator: Secondary | ICD-10-CM

## 2018-03-04 DIAGNOSIS — K219 Gastro-esophageal reflux disease without esophagitis: Secondary | ICD-10-CM

## 2018-03-04 DIAGNOSIS — Z955 Presence of coronary angioplasty implant and graft: Secondary | ICD-10-CM

## 2018-03-04 DIAGNOSIS — I2510 Atherosclerotic heart disease of native coronary artery without angina pectoris: Secondary | ICD-10-CM

## 2018-03-04 DIAGNOSIS — N183 Chronic kidney disease, stage 3 (moderate): Secondary | ICD-10-CM

## 2018-03-04 DIAGNOSIS — Z79899 Other long term (current) drug therapy: Secondary | ICD-10-CM

## 2018-03-04 DIAGNOSIS — G4733 Obstructive sleep apnea (adult) (pediatric): Secondary | ICD-10-CM

## 2018-03-04 DIAGNOSIS — I130 Hypertensive heart and chronic kidney disease with heart failure and stage 1 through stage 4 chronic kidney disease, or unspecified chronic kidney disease: Principal | ICD-10-CM

## 2018-03-04 DIAGNOSIS — Z6838 Body mass index (BMI) 38.0-38.9, adult: Secondary | ICD-10-CM

## 2018-03-04 DIAGNOSIS — I5043 Acute on chronic combined systolic (congestive) and diastolic (congestive) heart failure: Secondary | ICD-10-CM

## 2018-03-04 DIAGNOSIS — J470 Bronchiectasis with acute lower respiratory infection: Secondary | ICD-10-CM

## 2018-03-04 DIAGNOSIS — Z8614 Personal history of Methicillin resistant Staphylococcus aureus infection: Secondary | ICD-10-CM

## 2018-03-04 LAB — CBC: Lab: 5.9 K/UL (ref 4.5–11.0)

## 2018-03-04 LAB — COMPREHENSIVE METABOLIC PANEL: Lab: 140 MMOL/L — ABNORMAL LOW (ref 137–147)

## 2018-03-04 MED ORDER — BUMETANIDE 1 MG PO TAB
3 mg | ORAL_TABLET | Freq: Every day | ORAL | 0 refills | Status: AC
Start: 2018-03-04 — End: 2018-04-12

## 2018-03-04 MED ORDER — BUMETANIDE 1 MG PO TAB
3 mg | ORAL_TABLET | Freq: Every day | ORAL | 0 refills | Status: AC
Start: 2018-03-04 — End: 2018-03-04

## 2018-03-04 MED ORDER — ZINC OXIDE 20 % TP OINT
Freq: Two times a day (BID) | 1 refills | 30.00000 days | Status: AC
Start: 2018-03-04 — End: 2019-02-07

## 2018-03-04 MED ORDER — BUMETANIDE 2 MG PO TAB
ORAL_TABLET | Freq: Every day | 0 refills | Status: AC
Start: 2018-03-04 — End: 2018-04-12

## 2018-03-04 MED ORDER — BUMETANIDE 2 MG PO TAB
2 mg | ORAL_TABLET | Freq: Every day | ORAL | 0 refills | Status: AC
Start: 2018-03-04 — End: 2018-03-04

## 2018-03-04 MED ORDER — POLYETHYLENE GLYCOL 3350 17 GRAM PO PWPK
17 g | ORAL | 0 refills | 18.00000 days | Status: AC | PRN
Start: 2018-03-04 — End: 2019-07-28

## 2018-03-05 ENCOUNTER — Encounter: Admit: 2018-03-05 | Discharge: 2018-03-05 | Payer: MEDICARE

## 2018-03-05 DIAGNOSIS — G4733 Obstructive sleep apnea (adult) (pediatric): Secondary | ICD-10-CM

## 2018-03-05 DIAGNOSIS — I2510 Atherosclerotic heart disease of native coronary artery without angina pectoris: Secondary | ICD-10-CM

## 2018-03-05 DIAGNOSIS — E782 Mixed hyperlipidemia: Secondary | ICD-10-CM

## 2018-03-05 DIAGNOSIS — Z8679 Personal history of other diseases of the circulatory system: Secondary | ICD-10-CM

## 2018-03-05 DIAGNOSIS — R05 Cough: Secondary | ICD-10-CM

## 2018-03-05 DIAGNOSIS — L03116 Cellulitis of left lower limb: Secondary | ICD-10-CM

## 2018-03-05 DIAGNOSIS — J189 Pneumonia, unspecified organism: Secondary | ICD-10-CM

## 2018-03-05 DIAGNOSIS — I10 Essential (primary) hypertension: Secondary | ICD-10-CM

## 2018-03-05 DIAGNOSIS — Z95828 Presence of other vascular implants and grafts: Secondary | ICD-10-CM

## 2018-03-05 DIAGNOSIS — I714 Abdominal aortic aneurysm, without rupture: Secondary | ICD-10-CM

## 2018-03-05 DIAGNOSIS — J45909 Unspecified asthma, uncomplicated: Secondary | ICD-10-CM

## 2018-03-05 DIAGNOSIS — M961 Postlaminectomy syndrome, not elsewhere classified: Secondary | ICD-10-CM

## 2018-03-05 DIAGNOSIS — J302 Other seasonal allergic rhinitis: Secondary | ICD-10-CM

## 2018-03-05 DIAGNOSIS — R0600 Dyspnea, unspecified: Secondary | ICD-10-CM

## 2018-03-05 DIAGNOSIS — Z9981 Dependence on supplemental oxygen: Secondary | ICD-10-CM

## 2018-03-05 DIAGNOSIS — N401 Enlarged prostate with lower urinary tract symptoms: Secondary | ICD-10-CM

## 2018-03-05 DIAGNOSIS — M954 Acquired deformity of chest and rib: Secondary | ICD-10-CM

## 2018-03-05 DIAGNOSIS — I429 Cardiomyopathy, unspecified: Secondary | ICD-10-CM

## 2018-03-05 DIAGNOSIS — I509 Heart failure, unspecified: Secondary | ICD-10-CM

## 2018-03-05 DIAGNOSIS — K219 Gastro-esophageal reflux disease without esophagitis: Secondary | ICD-10-CM

## 2018-03-05 DIAGNOSIS — J479 Bronchiectasis, uncomplicated: Secondary | ICD-10-CM

## 2018-03-05 DIAGNOSIS — J449 Chronic obstructive pulmonary disease, unspecified: Secondary | ICD-10-CM

## 2018-03-05 DIAGNOSIS — R609 Edema, unspecified: Secondary | ICD-10-CM

## 2018-03-05 DIAGNOSIS — N183 Chronic kidney disease, stage 3 (moderate): Secondary | ICD-10-CM

## 2018-03-05 DIAGNOSIS — Z8614 Personal history of Methicillin resistant Staphylococcus aureus infection: Secondary | ICD-10-CM

## 2018-03-05 DIAGNOSIS — I5042 Chronic combined systolic (congestive) and diastolic (congestive) heart failure: Secondary | ICD-10-CM

## 2018-03-05 MED ORDER — ALBUTEROL SULFATE 90 MCG/ACTUATION IN HFAA
2 | RESPIRATORY_TRACT | 11 refills | Status: AC | PRN
Start: 2018-03-05 — End: ?

## 2018-03-06 ENCOUNTER — Ambulatory Visit: Admit: 2018-03-05 | Discharge: 2018-03-06 | Payer: MEDICARE

## 2018-03-06 DIAGNOSIS — D801 Nonfamilial hypogammaglobulinemia: Secondary | ICD-10-CM

## 2018-03-06 DIAGNOSIS — Z9989 Dependence on other enabling machines and devices: Secondary | ICD-10-CM

## 2018-03-06 DIAGNOSIS — I5042 Chronic combined systolic (congestive) and diastolic (congestive) heart failure: Secondary | ICD-10-CM

## 2018-03-06 DIAGNOSIS — J189 Pneumonia, unspecified organism: Secondary | ICD-10-CM

## 2018-03-06 DIAGNOSIS — J4541 Moderate persistent asthma with (acute) exacerbation: Secondary | ICD-10-CM

## 2018-03-06 DIAGNOSIS — J471 Bronchiectasis with (acute) exacerbation: Secondary | ICD-10-CM

## 2018-03-06 DIAGNOSIS — J9612 Chronic respiratory failure with hypercapnia: Secondary | ICD-10-CM

## 2018-03-06 DIAGNOSIS — G4733 Obstructive sleep apnea (adult) (pediatric): Secondary | ICD-10-CM

## 2018-03-07 ENCOUNTER — Encounter: Admit: 2018-03-07 | Discharge: 2018-03-07 | Payer: MEDICARE

## 2018-03-07 ENCOUNTER — Ambulatory Visit: Admit: 2018-03-07 | Discharge: 2018-03-08 | Payer: MEDICARE

## 2018-03-07 DIAGNOSIS — Z95828 Presence of other vascular implants and grafts: Secondary | ICD-10-CM

## 2018-03-07 DIAGNOSIS — R05 Cough: Secondary | ICD-10-CM

## 2018-03-07 DIAGNOSIS — J189 Pneumonia, unspecified organism: Secondary | ICD-10-CM

## 2018-03-07 DIAGNOSIS — L03116 Cellulitis of left lower limb: Secondary | ICD-10-CM

## 2018-03-07 DIAGNOSIS — R0600 Dyspnea, unspecified: Secondary | ICD-10-CM

## 2018-03-07 DIAGNOSIS — G4733 Obstructive sleep apnea (adult) (pediatric): Secondary | ICD-10-CM

## 2018-03-07 DIAGNOSIS — M961 Postlaminectomy syndrome, not elsewhere classified: Secondary | ICD-10-CM

## 2018-03-07 DIAGNOSIS — I714 Abdominal aortic aneurysm, without rupture: Secondary | ICD-10-CM

## 2018-03-07 DIAGNOSIS — K219 Gastro-esophageal reflux disease without esophagitis: Secondary | ICD-10-CM

## 2018-03-07 DIAGNOSIS — I5042 Chronic combined systolic (congestive) and diastolic (congestive) heart failure: Secondary | ICD-10-CM

## 2018-03-07 DIAGNOSIS — I2510 Atherosclerotic heart disease of native coronary artery without angina pectoris: Secondary | ICD-10-CM

## 2018-03-07 DIAGNOSIS — J45909 Unspecified asthma, uncomplicated: Secondary | ICD-10-CM

## 2018-03-07 DIAGNOSIS — I10 Essential (primary) hypertension: Secondary | ICD-10-CM

## 2018-03-07 DIAGNOSIS — J449 Chronic obstructive pulmonary disease, unspecified: Secondary | ICD-10-CM

## 2018-03-07 DIAGNOSIS — I429 Cardiomyopathy, unspecified: Secondary | ICD-10-CM

## 2018-03-07 DIAGNOSIS — I509 Heart failure, unspecified: Secondary | ICD-10-CM

## 2018-03-07 DIAGNOSIS — M954 Acquired deformity of chest and rib: Secondary | ICD-10-CM

## 2018-03-07 DIAGNOSIS — E782 Mixed hyperlipidemia: Secondary | ICD-10-CM

## 2018-03-07 DIAGNOSIS — R609 Edema, unspecified: Secondary | ICD-10-CM

## 2018-03-07 DIAGNOSIS — J479 Bronchiectasis, uncomplicated: Secondary | ICD-10-CM

## 2018-03-07 DIAGNOSIS — N183 Chronic kidney disease, stage 3 (moderate): Secondary | ICD-10-CM

## 2018-03-07 DIAGNOSIS — Z9981 Dependence on supplemental oxygen: Secondary | ICD-10-CM

## 2018-03-07 DIAGNOSIS — Z8614 Personal history of Methicillin resistant Staphylococcus aureus infection: Secondary | ICD-10-CM

## 2018-03-07 DIAGNOSIS — N401 Enlarged prostate with lower urinary tract symptoms: Secondary | ICD-10-CM

## 2018-03-07 DIAGNOSIS — Z8679 Personal history of other diseases of the circulatory system: Secondary | ICD-10-CM

## 2018-03-07 DIAGNOSIS — J302 Other seasonal allergic rhinitis: Secondary | ICD-10-CM

## 2018-03-07 MED ORDER — LEVOFLOXACIN 750 MG PO TAB
750 mg | ORAL_TABLET | Freq: Every day | ORAL | 0 refills | 7.00000 days | Status: AC
Start: 2018-03-07 — End: ?

## 2018-03-08 ENCOUNTER — Encounter: Admit: 2018-03-08 | Discharge: 2018-03-08 | Payer: MEDICARE

## 2018-03-08 DIAGNOSIS — Z9981 Dependence on supplemental oxygen: Secondary | ICD-10-CM

## 2018-03-08 DIAGNOSIS — R609 Edema, unspecified: Secondary | ICD-10-CM

## 2018-03-08 DIAGNOSIS — Z95828 Presence of other vascular implants and grafts: Secondary | ICD-10-CM

## 2018-03-08 DIAGNOSIS — I714 Abdominal aortic aneurysm, without rupture: Secondary | ICD-10-CM

## 2018-03-08 DIAGNOSIS — I5042 Chronic combined systolic (congestive) and diastolic (congestive) heart failure: Secondary | ICD-10-CM

## 2018-03-08 DIAGNOSIS — I429 Cardiomyopathy, unspecified: Secondary | ICD-10-CM

## 2018-03-08 DIAGNOSIS — J189 Pneumonia, unspecified organism: Secondary | ICD-10-CM

## 2018-03-08 DIAGNOSIS — K219 Gastro-esophageal reflux disease without esophagitis: Secondary | ICD-10-CM

## 2018-03-08 DIAGNOSIS — I10 Essential (primary) hypertension: Secondary | ICD-10-CM

## 2018-03-08 DIAGNOSIS — R0600 Dyspnea, unspecified: Secondary | ICD-10-CM

## 2018-03-08 DIAGNOSIS — L03116 Cellulitis of left lower limb: Secondary | ICD-10-CM

## 2018-03-08 DIAGNOSIS — M954 Acquired deformity of chest and rib: Secondary | ICD-10-CM

## 2018-03-08 DIAGNOSIS — B999 Unspecified infectious disease: Secondary | ICD-10-CM

## 2018-03-08 DIAGNOSIS — N183 Chronic kidney disease, stage 3 (moderate): Secondary | ICD-10-CM

## 2018-03-08 DIAGNOSIS — J449 Chronic obstructive pulmonary disease, unspecified: Secondary | ICD-10-CM

## 2018-03-08 DIAGNOSIS — H109 Unspecified conjunctivitis: Secondary | ICD-10-CM

## 2018-03-08 DIAGNOSIS — J479 Bronchiectasis, uncomplicated: Secondary | ICD-10-CM

## 2018-03-08 DIAGNOSIS — D801 Nonfamilial hypogammaglobulinemia: Secondary | ICD-10-CM

## 2018-03-08 DIAGNOSIS — J4540 Moderate persistent asthma, uncomplicated: Secondary | ICD-10-CM

## 2018-03-08 DIAGNOSIS — J310 Chronic rhinitis: Secondary | ICD-10-CM

## 2018-03-08 DIAGNOSIS — I2510 Atherosclerotic heart disease of native coronary artery without angina pectoris: Secondary | ICD-10-CM

## 2018-03-08 DIAGNOSIS — J302 Other seasonal allergic rhinitis: Secondary | ICD-10-CM

## 2018-03-08 DIAGNOSIS — Z8614 Personal history of Methicillin resistant Staphylococcus aureus infection: Secondary | ICD-10-CM

## 2018-03-08 DIAGNOSIS — J45909 Unspecified asthma, uncomplicated: Secondary | ICD-10-CM

## 2018-03-08 DIAGNOSIS — G4733 Obstructive sleep apnea (adult) (pediatric): Secondary | ICD-10-CM

## 2018-03-08 DIAGNOSIS — R05 Cough: Secondary | ICD-10-CM

## 2018-03-08 DIAGNOSIS — Z8679 Personal history of other diseases of the circulatory system: Secondary | ICD-10-CM

## 2018-03-08 DIAGNOSIS — M961 Postlaminectomy syndrome, not elsewhere classified: Secondary | ICD-10-CM

## 2018-03-08 DIAGNOSIS — E782 Mixed hyperlipidemia: Secondary | ICD-10-CM

## 2018-03-08 DIAGNOSIS — N401 Enlarged prostate with lower urinary tract symptoms: Secondary | ICD-10-CM

## 2018-03-08 DIAGNOSIS — I509 Heart failure, unspecified: Secondary | ICD-10-CM

## 2018-03-12 ENCOUNTER — Encounter: Admit: 2018-03-12 | Discharge: 2018-03-12 | Payer: MEDICARE

## 2018-03-12 ENCOUNTER — Ambulatory Visit: Admit: 2018-03-12 | Discharge: 2018-03-13 | Payer: MEDICARE

## 2018-03-12 DIAGNOSIS — J45909 Unspecified asthma, uncomplicated: Secondary | ICD-10-CM

## 2018-03-12 DIAGNOSIS — Z8679 Personal history of other diseases of the circulatory system: Secondary | ICD-10-CM

## 2018-03-12 DIAGNOSIS — Z95828 Presence of other vascular implants and grafts: Secondary | ICD-10-CM

## 2018-03-12 DIAGNOSIS — G4733 Obstructive sleep apnea (adult) (pediatric): Secondary | ICD-10-CM

## 2018-03-12 DIAGNOSIS — M954 Acquired deformity of chest and rib: Secondary | ICD-10-CM

## 2018-03-12 DIAGNOSIS — M961 Postlaminectomy syndrome, not elsewhere classified: Secondary | ICD-10-CM

## 2018-03-12 DIAGNOSIS — Z8614 Personal history of Methicillin resistant Staphylococcus aureus infection: Secondary | ICD-10-CM

## 2018-03-12 DIAGNOSIS — R0600 Dyspnea, unspecified: Secondary | ICD-10-CM

## 2018-03-12 DIAGNOSIS — I5042 Chronic combined systolic (congestive) and diastolic (congestive) heart failure: Secondary | ICD-10-CM

## 2018-03-12 DIAGNOSIS — J302 Other seasonal allergic rhinitis: Secondary | ICD-10-CM

## 2018-03-12 DIAGNOSIS — I429 Cardiomyopathy, unspecified: Secondary | ICD-10-CM

## 2018-03-12 DIAGNOSIS — E782 Mixed hyperlipidemia: Secondary | ICD-10-CM

## 2018-03-12 DIAGNOSIS — N183 Chronic kidney disease, stage 3 (moderate): Secondary | ICD-10-CM

## 2018-03-12 DIAGNOSIS — N401 Enlarged prostate with lower urinary tract symptoms: Secondary | ICD-10-CM

## 2018-03-12 DIAGNOSIS — I509 Heart failure, unspecified: Secondary | ICD-10-CM

## 2018-03-12 DIAGNOSIS — J189 Pneumonia, unspecified organism: Secondary | ICD-10-CM

## 2018-03-12 DIAGNOSIS — Z9981 Dependence on supplemental oxygen: Secondary | ICD-10-CM

## 2018-03-12 DIAGNOSIS — L03116 Cellulitis of left lower limb: Secondary | ICD-10-CM

## 2018-03-12 DIAGNOSIS — R05 Cough: Secondary | ICD-10-CM

## 2018-03-12 DIAGNOSIS — I10 Essential (primary) hypertension: Secondary | ICD-10-CM

## 2018-03-12 DIAGNOSIS — J479 Bronchiectasis, uncomplicated: Secondary | ICD-10-CM

## 2018-03-12 DIAGNOSIS — R609 Edema, unspecified: Secondary | ICD-10-CM

## 2018-03-12 DIAGNOSIS — I714 Abdominal aortic aneurysm, without rupture: Secondary | ICD-10-CM

## 2018-03-12 DIAGNOSIS — K219 Gastro-esophageal reflux disease without esophagitis: Secondary | ICD-10-CM

## 2018-03-12 DIAGNOSIS — I2510 Atherosclerotic heart disease of native coronary artery without angina pectoris: Secondary | ICD-10-CM

## 2018-03-12 DIAGNOSIS — J449 Chronic obstructive pulmonary disease, unspecified: Secondary | ICD-10-CM

## 2018-03-12 MED ORDER — DULOXETINE 60 MG PO CPDR
60 mg | ORAL_CAPSULE | Freq: Every day | ORAL | 3 refills | 60.00000 days | Status: AC
Start: 2018-03-12 — End: 2019-04-03

## 2018-03-13 DIAGNOSIS — Z6841 Body Mass Index (BMI) 40.0 and over, adult: Secondary | ICD-10-CM

## 2018-03-13 DIAGNOSIS — J479 Bronchiectasis, uncomplicated: Secondary | ICD-10-CM

## 2018-03-13 DIAGNOSIS — I5042 Chronic combined systolic (congestive) and diastolic (congestive) heart failure: Secondary | ICD-10-CM

## 2018-03-13 DIAGNOSIS — N183 Chronic kidney disease, stage 3 (moderate): Secondary | ICD-10-CM

## 2018-03-13 DIAGNOSIS — J439 Emphysema, unspecified: Secondary | ICD-10-CM

## 2018-03-13 DIAGNOSIS — F331 Major depressive disorder, recurrent, moderate: Secondary | ICD-10-CM

## 2018-03-13 DIAGNOSIS — J984 Other disorders of lung: Secondary | ICD-10-CM

## 2018-03-14 ENCOUNTER — Encounter: Admit: 2018-03-14 | Discharge: 2018-03-14 | Payer: MEDICARE

## 2018-03-14 DIAGNOSIS — J9612 Chronic respiratory failure with hypercapnia: Secondary | ICD-10-CM

## 2018-03-14 DIAGNOSIS — J4540 Moderate persistent asthma, uncomplicated: Secondary | ICD-10-CM

## 2018-03-14 DIAGNOSIS — J439 Emphysema, unspecified: Secondary | ICD-10-CM

## 2018-03-14 DIAGNOSIS — J470 Bronchiectasis with acute lower respiratory infection: Secondary | ICD-10-CM

## 2018-03-14 DIAGNOSIS — R05 Cough: Secondary | ICD-10-CM

## 2018-03-14 MED ORDER — SODIUM CHLORIDE 3 % IN NEBU
4 mL | Freq: Two times a day (BID) | RESPIRATORY_TRACT | 11 refills | 30.00000 days | Status: AC
Start: 2018-03-14 — End: 2019-01-09

## 2018-03-15 ENCOUNTER — Encounter: Admit: 2018-03-15 | Discharge: 2018-03-15 | Payer: MEDICARE

## 2018-03-15 DIAGNOSIS — J4540 Moderate persistent asthma, uncomplicated: Secondary | ICD-10-CM

## 2018-03-15 DIAGNOSIS — J9612 Chronic respiratory failure with hypercapnia: Secondary | ICD-10-CM

## 2018-03-15 DIAGNOSIS — J439 Emphysema, unspecified: Secondary | ICD-10-CM

## 2018-03-15 DIAGNOSIS — J479 Bronchiectasis, uncomplicated: Secondary | ICD-10-CM

## 2018-03-15 DIAGNOSIS — I5023 Acute on chronic systolic (congestive) heart failure: Secondary | ICD-10-CM

## 2018-03-18 ENCOUNTER — Encounter: Admit: 2018-03-18 | Discharge: 2018-03-18 | Payer: MEDICARE

## 2018-03-18 ENCOUNTER — Ambulatory Visit: Admit: 2018-03-18 | Discharge: 2018-03-18 | Payer: MEDICARE

## 2018-03-18 DIAGNOSIS — I509 Heart failure, unspecified: Secondary | ICD-10-CM

## 2018-03-18 DIAGNOSIS — J4540 Moderate persistent asthma, uncomplicated: Secondary | ICD-10-CM

## 2018-03-18 DIAGNOSIS — Z8614 Personal history of Methicillin resistant Staphylococcus aureus infection: Secondary | ICD-10-CM

## 2018-03-18 DIAGNOSIS — I10 Essential (primary) hypertension: Secondary | ICD-10-CM

## 2018-03-18 DIAGNOSIS — M954 Acquired deformity of chest and rib: Secondary | ICD-10-CM

## 2018-03-18 DIAGNOSIS — M47816 Spondylosis without myelopathy or radiculopathy, lumbar region: Secondary | ICD-10-CM

## 2018-03-18 DIAGNOSIS — Z8679 Personal history of other diseases of the circulatory system: Secondary | ICD-10-CM

## 2018-03-18 DIAGNOSIS — Z9889 Other specified postprocedural states: Secondary | ICD-10-CM

## 2018-03-18 DIAGNOSIS — J984 Other disorders of lung: Secondary | ICD-10-CM

## 2018-03-18 DIAGNOSIS — I255 Ischemic cardiomyopathy: Secondary | ICD-10-CM

## 2018-03-18 DIAGNOSIS — R609 Edema, unspecified: Secondary | ICD-10-CM

## 2018-03-18 DIAGNOSIS — I5042 Chronic combined systolic (congestive) and diastolic (congestive) heart failure: Secondary | ICD-10-CM

## 2018-03-18 DIAGNOSIS — R05 Cough: Secondary | ICD-10-CM

## 2018-03-18 DIAGNOSIS — I714 Abdominal aortic aneurysm, without rupture: Secondary | ICD-10-CM

## 2018-03-18 DIAGNOSIS — E782 Mixed hyperlipidemia: Secondary | ICD-10-CM

## 2018-03-18 DIAGNOSIS — J189 Pneumonia, unspecified organism: Secondary | ICD-10-CM

## 2018-03-18 DIAGNOSIS — M5416 Radiculopathy, lumbar region: Secondary | ICD-10-CM

## 2018-03-18 DIAGNOSIS — N401 Enlarged prostate with lower urinary tract symptoms: Secondary | ICD-10-CM

## 2018-03-18 DIAGNOSIS — J439 Emphysema, unspecified: Secondary | ICD-10-CM

## 2018-03-18 DIAGNOSIS — I2510 Atherosclerotic heart disease of native coronary artery without angina pectoris: Secondary | ICD-10-CM

## 2018-03-18 DIAGNOSIS — J449 Chronic obstructive pulmonary disease, unspecified: Secondary | ICD-10-CM

## 2018-03-18 DIAGNOSIS — G4733 Obstructive sleep apnea (adult) (pediatric): Secondary | ICD-10-CM

## 2018-03-18 DIAGNOSIS — J479 Bronchiectasis, uncomplicated: Secondary | ICD-10-CM

## 2018-03-18 DIAGNOSIS — M5136 Other intervertebral disc degeneration, lumbar region: Secondary | ICD-10-CM

## 2018-03-18 DIAGNOSIS — R0600 Dyspnea, unspecified: Secondary | ICD-10-CM

## 2018-03-18 DIAGNOSIS — K219 Gastro-esophageal reflux disease without esophagitis: Secondary | ICD-10-CM

## 2018-03-18 DIAGNOSIS — L03116 Cellulitis of left lower limb: Secondary | ICD-10-CM

## 2018-03-18 DIAGNOSIS — Z959 Presence of cardiac and vascular implant and graft, unspecified: Secondary | ICD-10-CM

## 2018-03-18 DIAGNOSIS — N183 Chronic kidney disease, stage 3 (moderate): Secondary | ICD-10-CM

## 2018-03-18 DIAGNOSIS — I951 Orthostatic hypotension: Secondary | ICD-10-CM

## 2018-03-18 DIAGNOSIS — M4726 Other spondylosis with radiculopathy, lumbar region: Secondary | ICD-10-CM

## 2018-03-18 DIAGNOSIS — Z9981 Dependence on supplemental oxygen: Secondary | ICD-10-CM

## 2018-03-18 DIAGNOSIS — J45909 Unspecified asthma, uncomplicated: Secondary | ICD-10-CM

## 2018-03-18 DIAGNOSIS — Z95828 Presence of other vascular implants and grafts: Secondary | ICD-10-CM

## 2018-03-18 DIAGNOSIS — J302 Other seasonal allergic rhinitis: Secondary | ICD-10-CM

## 2018-03-18 DIAGNOSIS — M961 Postlaminectomy syndrome, not elsewhere classified: Secondary | ICD-10-CM

## 2018-03-18 DIAGNOSIS — Z95810 Presence of automatic (implantable) cardiac defibrillator: Secondary | ICD-10-CM

## 2018-03-18 DIAGNOSIS — I429 Cardiomyopathy, unspecified: Secondary | ICD-10-CM

## 2018-03-18 MED ORDER — SPIRONOLACTONE 25 MG PO TAB
25 mg | ORAL_TABLET | Freq: Two times a day (BID) | ORAL | 3 refills | 46.00000 days | Status: AC
Start: 2018-03-18 — End: 2019-04-23

## 2018-03-19 ENCOUNTER — Encounter: Admit: 2018-03-19 | Discharge: 2018-03-19 | Payer: MEDICARE

## 2018-03-19 DIAGNOSIS — M4317 Spondylolisthesis, lumbosacral region: Secondary | ICD-10-CM

## 2018-03-22 ENCOUNTER — Encounter: Admit: 2018-03-22 | Discharge: 2018-03-22 | Payer: MEDICARE

## 2018-03-28 ENCOUNTER — Encounter: Admit: 2018-03-28 | Discharge: 2018-03-28 | Payer: MEDICARE

## 2018-03-28 ENCOUNTER — Ambulatory Visit: Admit: 2018-03-28 | Discharge: 2018-03-28 | Payer: MEDICARE

## 2018-03-28 ENCOUNTER — Ambulatory Visit: Admit: 2018-03-28 | Discharge: 2018-03-29 | Payer: MEDICARE

## 2018-03-28 DIAGNOSIS — M954 Acquired deformity of chest and rib: ICD-10-CM

## 2018-03-28 DIAGNOSIS — G4733 Obstructive sleep apnea (adult) (pediatric): ICD-10-CM

## 2018-03-28 DIAGNOSIS — Z8679 Personal history of other diseases of the circulatory system: ICD-10-CM

## 2018-03-28 DIAGNOSIS — Z8614 Personal history of Methicillin resistant Staphylococcus aureus infection: ICD-10-CM

## 2018-03-28 DIAGNOSIS — N401 Enlarged prostate with lower urinary tract symptoms: ICD-10-CM

## 2018-03-28 DIAGNOSIS — K219 Gastro-esophageal reflux disease without esophagitis: ICD-10-CM

## 2018-03-28 DIAGNOSIS — J189 Pneumonia, unspecified organism: ICD-10-CM

## 2018-03-28 DIAGNOSIS — J302 Other seasonal allergic rhinitis: ICD-10-CM

## 2018-03-28 DIAGNOSIS — E782 Mixed hyperlipidemia: ICD-10-CM

## 2018-03-28 DIAGNOSIS — L03116 Cellulitis of left lower limb: ICD-10-CM

## 2018-03-28 DIAGNOSIS — R609 Edema, unspecified: ICD-10-CM

## 2018-03-28 DIAGNOSIS — R0600 Dyspnea, unspecified: ICD-10-CM

## 2018-03-28 DIAGNOSIS — Z9981 Dependence on supplemental oxygen: ICD-10-CM

## 2018-03-28 DIAGNOSIS — N183 Chronic kidney disease, stage 3 (moderate): ICD-10-CM

## 2018-03-28 DIAGNOSIS — I714 Abdominal aortic aneurysm, without rupture: ICD-10-CM

## 2018-03-28 DIAGNOSIS — I509 Heart failure, unspecified: Principal | ICD-10-CM

## 2018-03-28 DIAGNOSIS — M961 Postlaminectomy syndrome, not elsewhere classified: ICD-10-CM

## 2018-03-28 DIAGNOSIS — J449 Chronic obstructive pulmonary disease, unspecified: ICD-10-CM

## 2018-03-28 DIAGNOSIS — I5042 Chronic combined systolic (congestive) and diastolic (congestive) heart failure: ICD-10-CM

## 2018-03-28 DIAGNOSIS — J45909 Unspecified asthma, uncomplicated: ICD-10-CM

## 2018-03-28 DIAGNOSIS — I10 Essential (primary) hypertension: ICD-10-CM

## 2018-03-28 DIAGNOSIS — R05 Cough: ICD-10-CM

## 2018-03-28 DIAGNOSIS — Z95828 Presence of other vascular implants and grafts: ICD-10-CM

## 2018-03-28 DIAGNOSIS — I2510 Atherosclerotic heart disease of native coronary artery without angina pectoris: Secondary | ICD-10-CM

## 2018-03-28 DIAGNOSIS — I429 Cardiomyopathy, unspecified: ICD-10-CM

## 2018-03-28 DIAGNOSIS — J479 Bronchiectasis, uncomplicated: ICD-10-CM

## 2018-03-28 LAB — BASIC METABOLIC PANEL
Lab: 1.4 mg/dL — ABNORMAL HIGH (ref 0.4–1.24)
Lab: 101 MMOL/L (ref 98–110)
Lab: 139 MMOL/L (ref 137–147)
Lab: 25 MMOL/L (ref 21–30)
Lab: 39 mg/dL — ABNORMAL HIGH (ref 7–25)
Lab: 4 MMOL/L (ref 3.5–5.1)
Lab: 57 mL/min — ABNORMAL LOW (ref >60)
Lab: 9.7 mg/dL (ref 8.5–10.6)
Lab: 13 — ABNORMAL HIGH (ref 3–12)

## 2018-03-28 LAB — CBC
Lab: 101 FL — ABNORMAL HIGH (ref 80–100)
Lab: 13 g/dL — ABNORMAL LOW (ref 13.5–16.5)
Lab: 16 % — ABNORMAL HIGH (ref 11–15)
Lab: 214 10*3/uL — ABNORMAL LOW (ref 150–400)
Lab: 3.9 M/UL — ABNORMAL LOW (ref 4.4–5.5)
Lab: 33 g/dL (ref >60)
Lab: 34 pg (ref >60)
Lab: 39 % — ABNORMAL LOW (ref 40–50)
Lab: 6.4 10*3/uL (ref 4.5–11.0)
Lab: 8.3 FL (ref 7–11)

## 2018-03-29 ENCOUNTER — Encounter: Admit: 2018-03-29 | Discharge: 2018-03-29 | Payer: MEDICARE

## 2018-03-29 DIAGNOSIS — M4317 Spondylolisthesis, lumbosacral region: Principal | ICD-10-CM

## 2018-03-29 DIAGNOSIS — Z01818 Encounter for other preprocedural examination: Principal | ICD-10-CM

## 2018-03-29 DIAGNOSIS — I5042 Chronic combined systolic (congestive) and diastolic (congestive) heart failure: ICD-10-CM

## 2018-04-01 ENCOUNTER — Encounter: Admit: 2018-04-01 | Discharge: 2018-04-01 | Payer: MEDICARE

## 2018-04-04 ENCOUNTER — Ambulatory Visit: Admit: 2018-04-04 | Discharge: 2018-04-04 | Payer: MEDICARE

## 2018-04-04 ENCOUNTER — Encounter: Admit: 2018-04-04 | Discharge: 2018-04-04 | Payer: MEDICARE

## 2018-04-04 DIAGNOSIS — Z95810 Presence of automatic (implantable) cardiac defibrillator: Principal | ICD-10-CM

## 2018-04-04 DIAGNOSIS — I255 Ischemic cardiomyopathy: Principal | ICD-10-CM

## 2018-04-09 ENCOUNTER — Ambulatory Visit: Admit: 2018-04-09 | Discharge: 2018-04-10 | Payer: MEDICARE

## 2018-04-09 ENCOUNTER — Encounter: Admit: 2018-04-09 | Discharge: 2018-04-09 | Payer: MEDICARE

## 2018-04-09 DIAGNOSIS — M954 Acquired deformity of chest and rib: ICD-10-CM

## 2018-04-09 DIAGNOSIS — Z95828 Presence of other vascular implants and grafts: ICD-10-CM

## 2018-04-09 DIAGNOSIS — N183 Chronic kidney disease, stage 3 (moderate): ICD-10-CM

## 2018-04-09 DIAGNOSIS — K219 Gastro-esophageal reflux disease without esophagitis: ICD-10-CM

## 2018-04-09 DIAGNOSIS — J449 Chronic obstructive pulmonary disease, unspecified: ICD-10-CM

## 2018-04-09 DIAGNOSIS — J302 Other seasonal allergic rhinitis: ICD-10-CM

## 2018-04-09 DIAGNOSIS — I429 Cardiomyopathy, unspecified: ICD-10-CM

## 2018-04-09 DIAGNOSIS — I5042 Chronic combined systolic (congestive) and diastolic (congestive) heart failure: ICD-10-CM

## 2018-04-09 DIAGNOSIS — J479 Bronchiectasis, uncomplicated: ICD-10-CM

## 2018-04-09 DIAGNOSIS — R05 Cough: ICD-10-CM

## 2018-04-09 DIAGNOSIS — E782 Mixed hyperlipidemia: ICD-10-CM

## 2018-04-09 DIAGNOSIS — Z8614 Personal history of Methicillin resistant Staphylococcus aureus infection: ICD-10-CM

## 2018-04-09 DIAGNOSIS — L03116 Cellulitis of left lower limb: ICD-10-CM

## 2018-04-09 DIAGNOSIS — G4733 Obstructive sleep apnea (adult) (pediatric): ICD-10-CM

## 2018-04-09 DIAGNOSIS — R609 Edema, unspecified: ICD-10-CM

## 2018-04-09 DIAGNOSIS — I10 Essential (primary) hypertension: ICD-10-CM

## 2018-04-09 DIAGNOSIS — J45909 Unspecified asthma, uncomplicated: ICD-10-CM

## 2018-04-09 DIAGNOSIS — I2510 Atherosclerotic heart disease of native coronary artery without angina pectoris: ICD-10-CM

## 2018-04-09 DIAGNOSIS — Z8679 Personal history of other diseases of the circulatory system: ICD-10-CM

## 2018-04-09 DIAGNOSIS — I509 Heart failure, unspecified: Principal | ICD-10-CM

## 2018-04-09 DIAGNOSIS — Z9981 Dependence on supplemental oxygen: ICD-10-CM

## 2018-04-09 DIAGNOSIS — I714 Abdominal aortic aneurysm, without rupture: ICD-10-CM

## 2018-04-09 DIAGNOSIS — J189 Pneumonia, unspecified organism: ICD-10-CM

## 2018-04-09 DIAGNOSIS — N401 Enlarged prostate with lower urinary tract symptoms: ICD-10-CM

## 2018-04-09 DIAGNOSIS — M961 Postlaminectomy syndrome, not elsewhere classified: ICD-10-CM

## 2018-04-09 DIAGNOSIS — R0600 Dyspnea, unspecified: ICD-10-CM

## 2018-04-09 MED ORDER — BUPIVACAINE (PF) 0.5 % (5 MG/ML) IJ SOLN
5 mL | Freq: Once | INTRAMUSCULAR | 0 refills | Status: CP
Start: 2018-04-09 — End: ?
  Administered 2018-04-09: 19:00:00 5 mL via INTRAMUSCULAR

## 2018-04-09 NOTE — Progress Notes
[facility]  INTERVENTIONAL PAIN PROCEDURE HISTORY AND PHYSICAL    Chief Complaint   Patient presents with   ??? Lower Back - Pain   ??? Procedure     MBB L5-S1       HISTORY OF PRESENT ILLNESS:  Ruben Wood. is a 78 y.o. year old male who presents for injection.  Denies fevers, chills, or recent hospitalizations.  No allergies to contrast, latex, seafood, iodine, or shellfish.  Patient denies blood thinning medications.        Medical History:   Diagnosis Date   ??? AAA (abdominal aortic aneurysm) (HCC) 10/15/2012   ??? Asthma    ??? BPH with obstruction/lower urinary tract symptoms    ??? Bronchiectasis (HCC)    ??? CAD (coronary artery disease), native coronary artery 08/15/2012    07/11/2002- Cath @ [facility] showed Severe double vessel disease(OMB of circumflex and distal circ)  inferior-basilar dyskinesis.                 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  09/12/12   Cath    ??? Cardiomyopathy (HCC) 07/19/2014   ??? Cellulitis of left lower extremity 11/12/2014    10/2014: admitted with cellulitis, [place] negative for venous clot, MRI without osteomyelitis.    11/12/2014 In clinic today, still with cellulitis.  Per patient and his wife, the erythema may be extending.  Cultures from drainage in hospital with MSSA, but Blood Cx negative.  No e/o osteomyelitis.  My concern is that this antibiotic regimen is not adequately treating his cellulitis.  We will broaden coverage to get MRSA with doxycycline.  Patient and wife given strict call/return criteria.  I will see patient in 3-4 days for re-evaluation.  No fever today.  Plan:  Stop cefpodoxime and start doxycylcine  11/17/2014 Much better, no warmth and erythema minimal.   Plan: continue doxycyline for full 10 day course.      ??? 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

## 2018-04-09 NOTE — Patient Instructions
Discharge Instructions for Medial Branch Block    Important information following your procedure today: You may drive today    This injection is for diagnostic purposes, it is a test. Only short term results are expected.    1. Though the procedure is generally safe and complications are rare, we do ask that you be aware of any of the following:   ? Any swelling, persistent redness, new bleeding, or drainage from the site of the injection.  ? You should not experience a severe headache.  ? You should not run a fever over 101??? F.  ? New onset of sharp, severe back & or neck pain.  ? New onset of upper or lower extremity numbness or weakness.  ? New difficulty controlling bowel or bladder function after the injection.  ? New shortness of breath.    If any of these occur, please call to report this occurrence to a nurse at [phone]. If you are calling after 4:00 p.m. or on weekends or holidays please call [phone] and ask to have the resident physician on call for the physician paged or go to your local emergency room.  2. You may experience soreness at the injection site. Ice can be applied at 20 minute intervals. Avoid application of direct heat, hot showers or hot tubs today.  3. Patients taking a daily blood thinner can resume their regular dose this evening.  4. It is important that you take all medications ordered by your pain physician. Taking medication as ordered is an important part of your pain care plan. If you cannot continue the medication plan, please notify the physician.   5. Remain active today. Do the activities that would normally cause you pain.  6. It is important for you to keep track of the results of this test on paper.  ? Did you get relief?  ? Percentage of relief?  ? How long did it last?  Call back to report the results to a nurse on [facility] at [phone] . Use notes that you kept when giving your report. You may have to leave a

## 2018-04-09 NOTE — Procedures
Attending Surgeon: Curly Rim, MD    Anesthesia: Local    Pre-Procedure Diagnosis:   1. Lumbar facet arthropathy        Post-Procedure Diagnosis:   1. Lumbar facet arthropathy        [place] [facility] SPINE INJECT PVRT FACET MBB JT LMBR/SAC  Procedure: medial branch block    Laterality: bilateral    Location: lumbar - L4-5 and L5-S1      Consent:   Consent obtained: verbal and written  Consent given by: patient  Risks discussed: allergic reaction, bruising, infection, nerve damage, no change or worsening in pain, reaction to medication, seizure and weakness    Discussed with patient the purpose of the treatment/procedure, other ways of treating my condition, including no treatment/ procedure and the risks and benefits of the alternatives. Patient has decided to proceed with treatment/procedure.        Universal Protocol:  Relevant documents: relevant documents present and verified  Imaging studies: imaging studies available  Site marked: the operative site was marked  Patient identity confirmed: Patient identify confirmed verbally with patient.        Time out: Immediately prior to procedure a time out was called to verify the correct patient, procedure, equipment, support staff and site/side marked as required      Procedures Details:   Indications: diagnostic evaluation   Prep: chlorhexidine  Patient position: prone  Estimated Blood Loss: minimal  Specimens: none  Number of Levels: 2  Guidance: fluoroscopy  Needle and Epidural Catheter: quincke  Needle size: 25 G  Injection procedure: Incremental injection and Negative aspiration for blood  Patient tolerance: Patient tolerated the procedure well with no immediate complications. Pressure was applied, and hemostasis was accomplished.  Comments: DESCRIPTION OF PROCEDURE:  The procedure risks and benefits were explained, and informed consent was obtained from the patient.  The patient was placed in the prone position on the fluoroscopy table.  Blood pressure cuff and oxygen saturation monitors were attached and the patient was monitored throughout the entire procedure.  The L4 and L5 vertebral levels were identified with the use of fluoroscopy in AP view.  The skin was prepped using Chlorhexadine and draped in aseptic fashion.  The C-arm was obliqued to the left to visualize the junction of the transverse process and superior articular process of the L4 and L5 vertebrae.  The skin and subcutaneous tissue was anesthetized using 2 mL of 1 percent lidocaine with a 27-gauge needle.  A 3.5-inch 25-gauge spinal needle was advanced parallel to the x-ray beam towards the junction of the superior articular process and transverse process to the anatomic position of the L3 and L4 medial branches.  An equivalent needle was then advanced parallel towards the anatomic position of the L5 primary dorsal ramus at the sacral ala.  The needle tips were advanced slowly until the tip of the needle made contact with bone.  After negative aspiration, 0.5 mL of 0.5 percent bupivacaine was injected at each level.  The needles were then removed.    Attention was then taken to the right side where the L4 and L5 vertebral levels were identified with the use of fluoroscopy in AP view.  The C-arm was obliqued to visualize the junction of the transverse process and superior articular process of L4 and L5 on the right.  The skin and subcutaneous tissue was anesthetized using 2 mL of 1 percent lidocaine with a 27-gauge needle.  A 3.5-inch 25-gauge spinal needle was advanced parallel to the x-ray  beam towards the junction of the superior articular process and transverse process to the anatomic position of the L3 and L4 medial branches.  An equivalent needle was then advanced parallel towards the anatomic position of the L5 primary dorsal ramus at the sacral ala.  The needle tips were advanced slowly until the tip of the needle made

## 2018-04-10 ENCOUNTER — Encounter: Admit: 2018-04-10 | Discharge: 2018-04-10 | Payer: MEDICARE

## 2018-04-10 ENCOUNTER — Ambulatory Visit: Admit: 2018-04-09 | Discharge: 2018-04-10 | Payer: MEDICARE

## 2018-04-10 ENCOUNTER — Ambulatory Visit: Admit: 2018-04-10 | Discharge: 2018-04-11 | Payer: MEDICARE

## 2018-04-10 DIAGNOSIS — N183 Chronic kidney disease, stage 3 (moderate): ICD-10-CM

## 2018-04-10 DIAGNOSIS — I2510 Atherosclerotic heart disease of native coronary artery without angina pectoris: ICD-10-CM

## 2018-04-10 DIAGNOSIS — K219 Gastro-esophageal reflux disease without esophagitis: ICD-10-CM

## 2018-04-10 DIAGNOSIS — M954 Acquired deformity of chest and rib: ICD-10-CM

## 2018-04-10 DIAGNOSIS — R05 Cough: ICD-10-CM

## 2018-04-10 DIAGNOSIS — J45909 Unspecified asthma, uncomplicated: ICD-10-CM

## 2018-04-10 DIAGNOSIS — Z950 Presence of cardiac pacemaker: ICD-10-CM

## 2018-04-10 DIAGNOSIS — G4733 Obstructive sleep apnea (adult) (pediatric): ICD-10-CM

## 2018-04-10 DIAGNOSIS — M47816 Spondylosis without myelopathy or radiculopathy, lumbar region: Principal | ICD-10-CM

## 2018-04-10 DIAGNOSIS — Z95828 Presence of other vascular implants and grafts: ICD-10-CM

## 2018-04-10 DIAGNOSIS — J479 Bronchiectasis, uncomplicated: ICD-10-CM

## 2018-04-10 DIAGNOSIS — Z9981 Dependence on supplemental oxygen: ICD-10-CM

## 2018-04-10 DIAGNOSIS — I429 Cardiomyopathy, unspecified: ICD-10-CM

## 2018-04-10 DIAGNOSIS — I130 Hypertensive heart and chronic kidney disease with heart failure and stage 1 through stage 4 chronic kidney disease, or unspecified chronic kidney disease: ICD-10-CM

## 2018-04-10 DIAGNOSIS — Z955 Presence of coronary angioplasty implant and graft: ICD-10-CM

## 2018-04-10 DIAGNOSIS — Z8614 Personal history of Methicillin resistant Staphylococcus aureus infection: ICD-10-CM

## 2018-04-10 DIAGNOSIS — E782 Mixed hyperlipidemia: ICD-10-CM

## 2018-04-10 DIAGNOSIS — I509 Heart failure, unspecified: Principal | ICD-10-CM

## 2018-04-10 DIAGNOSIS — L03116 Cellulitis of left lower limb: ICD-10-CM

## 2018-04-10 DIAGNOSIS — J189 Pneumonia, unspecified organism: ICD-10-CM

## 2018-04-10 DIAGNOSIS — Z8679 Personal history of other diseases of the circulatory system: ICD-10-CM

## 2018-04-10 DIAGNOSIS — M961 Postlaminectomy syndrome, not elsewhere classified: ICD-10-CM

## 2018-04-10 DIAGNOSIS — J302 Other seasonal allergic rhinitis: ICD-10-CM

## 2018-04-10 DIAGNOSIS — J449 Chronic obstructive pulmonary disease, unspecified: ICD-10-CM

## 2018-04-10 DIAGNOSIS — I5042 Chronic combined systolic (congestive) and diastolic (congestive) heart failure: ICD-10-CM

## 2018-04-10 DIAGNOSIS — I10 Essential (primary) hypertension: ICD-10-CM

## 2018-04-10 DIAGNOSIS — I714 Abdominal aortic aneurysm, without rupture: ICD-10-CM

## 2018-04-10 DIAGNOSIS — Z8249 Family history of ischemic heart disease and other diseases of the circulatory system: ICD-10-CM

## 2018-04-10 DIAGNOSIS — R609 Edema, unspecified: ICD-10-CM

## 2018-04-10 DIAGNOSIS — Z87891 Personal history of nicotine dependence: ICD-10-CM

## 2018-04-10 DIAGNOSIS — R0600 Dyspnea, unspecified: ICD-10-CM

## 2018-04-10 DIAGNOSIS — N401 Enlarged prostate with lower urinary tract symptoms: ICD-10-CM

## 2018-04-10 LAB — POC POTASSIUM
Lab: 4.4 MMOL/L (ref 3.5–5.1)
Lab: 4.6 MMOL/L (ref 3.5–5.1)

## 2018-04-10 LAB — POC CREATININE, RAD
Lab: 1.5 mg/dL — ABNORMAL HIGH (ref 0.4–1.24)
Lab: 1.6 mg/dL — ABNORMAL HIGH (ref 0.4–1.24)

## 2018-04-10 LAB — POC SODIUM
Lab: 135 MMOL/L — ABNORMAL LOW (ref 137–147)
Lab: 135 MMOL/L — ABNORMAL LOW (ref 137–147)

## 2018-04-10 NOTE — Progress Notes
Component Value Date/Time    AST (SGOT) 41* 11/04/2014 12:26 AM    ALT (SGPT) 19 11/04/2014 12:26 AM    TOTAL BILIRUBIN 1.4* 11/04/2014 12:26 AM        Lab Results   Component Value Date    CHOL 148 12/15/2013    TRIG 122 12/15/2013    HDL 51 12/15/2013    LDL 88 12/15/2013    VLDL 24 12/15/2013    NONHDLCHOL 97 12/15/2013       BP Readings from Last 3 Encounters:   11/12/14 129/80   11/09/14 111/44   11/04/14 146/65     Plan:   78 y.o. with known CAD with CTO of RCA and obtuse marginal branch with high grade stenosis (90%) in mid left circ.    Advised heart healthy diet and daily exercise.    Continue high intensity statin, atorvastatin       ??? AAA (abdominal aortic aneurysm) (HCC) 10/15/2012     Duplex done at OSH in 2007 and 2008 ~ 4.1 cm    Duplex 10/15/12-AAA 7.3 cm AP x 7.3 cm       ??? History of MRSA infection 10/14/2012   ??? Chronic cough 08/29/2012   ??? CAD (coronary artery disease), native coronary artery 08/15/2012     07/11/2002- Cath @ [facility] showed Severe double vessel disease(OMB of circumflex and distal circ)  inferior-basilar dyskinesis.                  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    09/12/12   Cath - [place]:  CTO of the right coronary artery, CTO of the obtuse marginal branch and high-grade stenosis of   90% in the mid left circumflex artery No significant stenosis in the left anterior descending artery or left main vessel     Failed attempt in opening 2nd obtuse marginal CTO as described above.    10/14/12: Unsuccessful attempt to open CTO of OMB and mid-CFX by Dr. Mackey Birchwood      04/02/17: Cath by Dr. Steward Ros:   4. Chronic total occlusion of the circumflex.  5. Chronic total occlusion of the right coronary artery, status post percutaneous coronary intervention with drug-eluting stent times 2. 6. Small distal right coronary artery perforation without echo or hemodynamic evidence of compromise.    06/29/17-cardiac catheterization by Dr. Steward Ros. One DES placed to chronic total occlusion of the circumflex artery. The RCA stent was patent.     ??? OSA on CPAP 08/13/2012     CPAP at  DME: [facility]    Split night:  08/14/2017  AHI 43.2  REM n/a  Time below 88 %: 98 mins  Lowest sat: 83%     CPAP 10 cm h20 effective      ??? GERD (gastroesophageal reflux disease) 08/13/2012     -PPI  -follows with GI     ??? 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  Discussed the importance of diet.  Exercise as tolerated.        ??? Essential  hypertension 08/13/2012   ??? Chest wall deformity 07/09/2012     Chest wall reconstruction on 07/09/12  Lung herniation- bio bridge           Review of Systems   Constitution: Negative.   HENT: Negative.    Eyes: Negative.    Cardiovascular: Negative.    Respiratory: Negative.    Endocrine: Negative.    Hematologic/Lymphatic: Negative.    Skin: Negative.    Musculoskeletal: Negative.    Gastrointestinal: Negative.    Genitourinary: Negative.    Neurological: Negative.    Psychiatric/Behavioral: Negative.    Allergic/Immunologic: Negative.    All other systems reviewed and are negative.      Physical Exam  General Appearance: In NAD, obese,   Neck Veins:  JVP 7, neck veins fill in, difficult to assess  Chest Inspection: chest is normal in appearance   Respiratory Effort:accessory muscle use intermittently after coughing fits, breathing comfortably, no respiratory distress   Auscultation/Percussion: lungs clear to auscultation, no rales, rhonchi or wheezing   Cardiac Rhythm: regular rhythm and normal rate He is willing to take the risk and go ahead with the surgery.  I also personally feel that he should go for the surgery since he already has atrophy and weakness of the hand.  The surgery will help him to get his left upper extremity function back or at least prevent further deterioration.  If electrocautery will be used I recommend to deactivate his defibrillator mode of his ICD.  Please call us for any questions or concerns.  In case there is any question of decompensation of his heart failure or primary team wants to be extra cautious heart failure team can be consulted prior to or for post surgical management if needed.        60 minutes of time spent with patient and family.  Greater than 30 minutes of this time spent counseling regarding pros and cons of undergoing the surgery, risks involved in the surgery, decompensated heart failure, poor cardiac and pulmonary reserves, acute kidney injury secondary to dehydration, heart failure with reduced ejection fraction, medications and volume control.  Vanetta Shawl M.D  [facility] Transplant Cardiologist      Current Medications (including today's revisions)  ??? acetaminophen (TYLENOL) 325 mg tablet Take two tablets by mouth every 6 hours as needed for Pain.   ??? albuterol 0.083% (PROVENTIL; VENTOLIN) 2.5 mg /3 mL (0.083 %) nebulizer solution Inhale 3 mL solution by nebulizer as directed every 6 hours as needed for Wheezing or Shortness of Breath. Indications: asthma   ??? albuterol sulfate (PROAIR HFA) 90 mcg/actuation aerosol inhaler Inhale two puffs by mouth into the lungs every 4 hours as needed for Wheezing or Shortness of Breath.   ??? allopurinol (ZYLOPRIM) 300 mg tablet Take one tablet by mouth daily. Take with food.   ??? Ammonium Lactate-Emu Oil (EMU-LAC) 10 % crea Apply  topically to affected area. Apply to feet    ??? ascorbic acid (VITAMIN C) 500 mg tablet Take one tablet by mouth daily. lungs twice daily. Use with [place].  Indications: asthma, chronic cough, bronchiectasis   ??? spironolactone (ALDACTONE) 25 mg tablet Take one tablet by mouth twice daily. Take with food.   ??? tamsulosin (FLOMAX) 0.4 mg capsule Take 1 capsule by mouth daily.   ??? traMADol (ULTRAM) 50 mg tablet Take one tablet by mouth every 8 hours as needed for Pain.   ??? zinc oxide 20 % topical ointment Place twice a day  on the buttock wound

## 2018-04-11 ENCOUNTER — Encounter: Admit: 2018-04-11 | Discharge: 2018-04-11 | Payer: MEDICARE

## 2018-04-11 ENCOUNTER — Ambulatory Visit: Admit: 2018-04-11 | Discharge: 2018-04-11 | Payer: MEDICARE

## 2018-04-11 DIAGNOSIS — I429 Cardiomyopathy, unspecified: ICD-10-CM

## 2018-04-11 DIAGNOSIS — J302 Other seasonal allergic rhinitis: ICD-10-CM

## 2018-04-11 DIAGNOSIS — N183 Chronic kidney disease, stage 3 (moderate): ICD-10-CM

## 2018-04-11 DIAGNOSIS — I5022 Chronic systolic (congestive) heart failure: ICD-10-CM

## 2018-04-11 DIAGNOSIS — R609 Edema, unspecified: ICD-10-CM

## 2018-04-11 DIAGNOSIS — Z8679 Personal history of other diseases of the circulatory system: ICD-10-CM

## 2018-04-11 DIAGNOSIS — E782 Mixed hyperlipidemia: ICD-10-CM

## 2018-04-11 DIAGNOSIS — M961 Postlaminectomy syndrome, not elsewhere classified: ICD-10-CM

## 2018-04-11 DIAGNOSIS — I5042 Chronic combined systolic (congestive) and diastolic (congestive) heart failure: Secondary | ICD-10-CM

## 2018-04-11 DIAGNOSIS — N401 Enlarged prostate with lower urinary tract symptoms: ICD-10-CM

## 2018-04-11 DIAGNOSIS — L03116 Cellulitis of left lower limb: ICD-10-CM

## 2018-04-11 DIAGNOSIS — Z8614 Personal history of Methicillin resistant Staphylococcus aureus infection: ICD-10-CM

## 2018-04-11 DIAGNOSIS — G5621 Lesion of ulnar nerve, right upper limb: Principal | ICD-10-CM

## 2018-04-11 DIAGNOSIS — M4807 Spinal stenosis, lumbosacral region: Secondary | ICD-10-CM

## 2018-04-11 DIAGNOSIS — I2510 Atherosclerotic heart disease of native coronary artery without angina pectoris: ICD-10-CM

## 2018-04-11 DIAGNOSIS — Z01810 Encounter for preprocedural cardiovascular examination: Principal | ICD-10-CM

## 2018-04-11 DIAGNOSIS — G4733 Obstructive sleep apnea (adult) (pediatric): ICD-10-CM

## 2018-04-11 DIAGNOSIS — I509 Heart failure, unspecified: Principal | ICD-10-CM

## 2018-04-11 DIAGNOSIS — I714 Abdominal aortic aneurysm, without rupture: ICD-10-CM

## 2018-04-11 DIAGNOSIS — I255 Ischemic cardiomyopathy: Secondary | ICD-10-CM

## 2018-04-11 DIAGNOSIS — R0600 Dyspnea, unspecified: ICD-10-CM

## 2018-04-11 DIAGNOSIS — K219 Gastro-esophageal reflux disease without esophagitis: ICD-10-CM

## 2018-04-11 DIAGNOSIS — Z95828 Presence of other vascular implants and grafts: ICD-10-CM

## 2018-04-11 DIAGNOSIS — G5691 Unspecified mononeuropathy of right upper limb: ICD-10-CM

## 2018-04-11 DIAGNOSIS — J449 Chronic obstructive pulmonary disease, unspecified: ICD-10-CM

## 2018-04-11 DIAGNOSIS — Z9981 Dependence on supplemental oxygen: ICD-10-CM

## 2018-04-11 DIAGNOSIS — J984 Other disorders of lung: ICD-10-CM

## 2018-04-11 DIAGNOSIS — I10 Essential (primary) hypertension: ICD-10-CM

## 2018-04-11 DIAGNOSIS — J189 Pneumonia, unspecified organism: ICD-10-CM

## 2018-04-11 DIAGNOSIS — J439 Emphysema, unspecified: ICD-10-CM

## 2018-04-11 DIAGNOSIS — M954 Acquired deformity of chest and rib: ICD-10-CM

## 2018-04-11 DIAGNOSIS — J45909 Unspecified asthma, uncomplicated: ICD-10-CM

## 2018-04-11 DIAGNOSIS — R05 Cough: ICD-10-CM

## 2018-04-11 DIAGNOSIS — J479 Bronchiectasis, uncomplicated: ICD-10-CM

## 2018-04-11 MED ORDER — RXAMB AMITR/GABAPEN/EMU OIL 4/4/10% CREAM (COMPOUND)
Freq: Three times a day (TID) | TOPICAL | 11 refills | Status: AC
Start: 2018-04-11 — End: 2019-01-03
  Filled 2018-04-11: qty 50, 4d supply

## 2018-04-11 MED ORDER — GABAPENTIN 100 MG PO CAP
100 mg | ORAL_CAPSULE | Freq: Two times a day (BID) | ORAL | 3 refills | Status: AC
Start: 2018-04-11 — End: 2018-05-28

## 2018-04-11 NOTE — Progress Notes
??? acetaminophen (TYLENOL) 325 mg tablet Take two tablets by mouth every 6 hours as needed for Pain.   ??? albuterol 0.083% (PROVENTIL; VENTOLIN) 2.5 mg /3 mL (0.083 %) nebulizer solution Inhale 3 mL solution by nebulizer as directed every 6 hours as needed for Wheezing or Shortness of Breath. Indications: asthma   ??? albuterol sulfate (PROAIR HFA) 90 mcg/actuation aerosol inhaler Inhale two puffs by mouth into the lungs every 4 hours as needed for Wheezing or Shortness of Breath.   ??? allopurinol (ZYLOPRIM) 300 mg tablet Take one tablet by mouth daily. Take with food.   ??? AMITR/GABAPEN/EMU OIL 05/24/08% CREAM (COMPOUND) Apply 5 grams topically to affected area three times daily.   ??? Ammonium Lactate-Emu Oil (EMU-LAC) 10 % crea Apply  topically to affected area. Apply to feet    ??? ascorbic acid (VITAMIN C) 500 mg tablet Take one tablet by mouth daily.   ??? aspirin EC 81 mg tablet Take one tablet by mouth daily. Take with food.   ??? atorvastatin (LIPITOR) 40 mg tablet Take one tablet by mouth daily.   ??? benzonatate (TESSALON PERLES) 100 mg capsule Take two capsules by mouth every 8 hours as needed for Cough.   ??? budesonide/formoterol (SYMBICORT HFA) 160/4.5 mcg inhalation Inhale two puffs by mouth into the lungs twice daily. Rinse mouth after use.   ??? bumetanide (BUMEX) 1 mg tablet Take three tablets by mouth daily for 90 days.   ??? bumetanide (BUMEX) 2 mg tablet Take 1 tablet by mouth every day at 5 pm.   ??? cholecalciferol (VITAMIN D-3) 1,000 units tablet Take 1,000 Units by mouth daily.   ??? clopiDOGrel (PLAVIX) 75 mg tablet Take one tablet by mouth daily.   ??? docusate (COLACE) 100 mg capsule Take 100 mg by mouth daily as needed for Constipation.   ??? doxycycline (VIBRAMYCIN) 100 mg capsule Take 100 mg by mouth daily. For prophylaxis   ??? duloxetine DR (CYMBALTA) 60 mg capsule Take one capsule by mouth daily.   ??? finasteride (PROSCAR) 5 mg tablet Take one tablet by mouth daily. Head: Normocephalic and atraumatic.      Right Ear: External ear normal. There is no impacted cerumen.      Left Ear: External ear normal. There is no impacted cerumen.      Nose: No congestion or rhinorrhea.      Mouth/Throat:      Pharynx: No oropharyngeal exudate.   Eyes:      General: No scleral icterus.        Right eye: No discharge.         Left eye: No discharge.      Conjunctiva/sclera: Conjunctivae normal.      Pupils: Pupils are equal, round, and reactive to light.   Neck:      Musculoskeletal: Neck supple.      Thyroid: No thyromegaly.      Vascular: No JVD.   Cardiovascular:      Rate and Rhythm: Normal rate and regular rhythm.      Heart sounds: Normal heart sounds. No murmur. No friction rub. No gallop.    Pulmonary:      Effort: Pulmonary effort is normal. No respiratory distress.      Breath sounds: Wheezing and rhonchi present. No rales.   Abdominal:      General: Bowel sounds are normal. There is no distension.      Palpations: Abdomen is soft.      Tenderness: There is no abdominal tenderness.  There is no guarding or rebound.   Musculoskeletal:      Right lower leg: No edema.      Left lower leg: No edema.   Lymphadenopathy:      Cervical: No cervical adenopathy.   Skin:     General: Skin is warm and dry.      Coloration: Skin is not pale.      Findings: No erythema or rash.      Comments: Brawny skin changes worse on left than right   Neurological:      Mental Status: He is alert.      Comments: Weakness with grip strength on the left, atrophy of hand muscles noted       Labs Reviewed:   Lab Results   Component Value Date/Time    HGBA1C 6.8 (H) 04/03/2017 04:39 AM    HGBA1C 5.4 02/25/2016 11:50 AM    HGBA1C 6.2 (H) 03/01/2015 02:18 PM    HGBPOC 11.2 (L) 10/25/2012 10:58 AM    HGBPOC 12.9 (L) 10/25/2012 08:55 AM    TSH 2.160 03/01/2015 02:18 PM    FREET4R 0.99 12/15/2014 09:12 AM    CHOL 74 06/29/2017 08:06 AM    TRIG 161 (H) 06/29/2017 08:06 AM    HDL 28 (L) 06/29/2017 08:06 AM LDL 34 06/29/2017 08:06 AM    NA 139 03/28/2018 08:45 AM    K 4.0 03/28/2018 08:45 AM    CL 101 03/28/2018 08:45 AM    CO2 25 03/28/2018 08:45 AM    GAP 13 (H) 03/28/2018 08:45 AM    BUN 39 (H) 03/28/2018 08:45 AM    CR 1.6 (H) 04/10/2018 02:46 PM    CR 1.44 (H) 03/28/2018 08:45 AM    GLU 121 (H) 03/28/2018 08:45 AM    CA 9.7 03/28/2018 08:45 AM    PO4 2.7 11/19/2017 04:58 AM    ALBUMIN 3.2 (L) 03/04/2018 04:59 AM    TOTPROT 5.4 (L) 03/04/2018 04:59 AM    ALKPHOS 78 03/04/2018 04:59 AM    AST 13 03/04/2018 04:59 AM    ALT 12 03/04/2018 04:59 AM    TOTBILI 0.6 03/04/2018 04:59 AM    GFR 47 (L) 03/28/2018 08:45 AM    GFRAA 57 (L) 03/28/2018 08:45 AM    PSA 10.36 (H) 08/28/2017 05:18 PM         Assessment and Plan:    1. Cubital tunnel syndrome on right    2. Compression neuropathy of upper extremity, right    3. Chronic combined systolic and diastolic congestive heart failure, NYHA class 2 (HCC)    4. Mixed restrictive and obstructive lung disease (HCC)    5. Spinal stenosis of lumbosacral region      His chief concern today is the cubital tunnel syndrome as well as weakness of his left hand and atrophy of the left hand.  He does need a cubital tunnel release.  I do think that he would do well with a local block, but I do have concerns with him undergoing general anesthesia as do his heart failure physicians.  I will coordinate with Dr. Sherryll Burger, and the heart failure clinic, as well as Dr. Annia Belt.  He does appear well compensated from heart failure standpoint today, so did not make any medication adjustments.  He seems to be in a fairly stable place pulmonary wise today.  I did not make any changes to his regimen and there either.  I did restart his gabapentin that reports that this was  helpful.  We had been trying to minimize some of his medications and we stopped that given concerns about fluid retention, but he would like to restart that given the significant amount of pain that he has. Otherwise, you will get a phone call or letter.   If you are expecting results and have not heard from my office within 2 weeks of your testing, please send a MyChart message or call my office.     ??? Scheduling:  Our Scheduling phone number is [phone].  Same Day appointments are usually available. Please ask for an annual or yearly physical appointment if it has been over 1 year since our last appointment.  ??? Appointment Reminders on your cell phone: Make sure we have your cell phone number, and Text [place] to [phone].  ??? Support groups for many chronic illnesses are available through [facility]: [email or web address] or [phone].             Return in about 4 weeks (around 05/09/2018).

## 2018-04-11 NOTE — Patient Instructions
Future Appointments   Date Time Provider Department Center   04/11/2018  1:20 PM Kongmeng Santoro, [name], MD [facility] IM   04/16/2018  2:30 PM [place] HF NURSE [facility] CVM Exam   04/16/2018  3:00 PM Bunnie Philips, APRN-NP [facility] CVM Exam   04/18/2018 12:00 AM [facility] REMOTE MONITORING [facility] CVM Procedur   04/23/2018  1:00 PM [facility] PROC SCH [facility] SP[facility]   05/14/2018 11:00 AM [place] HF PACEMAKER [facility] CVM Procedur   05/14/2018 11:30 AM Bunnie Philips, APRN-NP [facility] CVM Exam   05/20/2018 12:00 AM [facility]IT[facility] CVM Procedur   05/27/2018 11:30 AM [name], Lenice Llamas, PA-C [facility] Urology   06/18/2018  3:30 PM Lady Saucier, MD [facility] IM   06/20/2018 12:00 AM [facility]OTE MONITORING [facility] CVM Procedur   07/04/2018  2:30 PM [facility]OTE MONITORING [facility] CVM Procedur   07/08/2018 10:40 AM Hyman Bower, DO [facility] IM   08/02/2018  1:30 PM Arneta Cliche, MD [facility] CVM Exam       If you are on Mychart, you will now be able to read your clinic note from me.  My hope is that this will improve your engagement and insight into your health care and improve the accuracy of your medical record.  If you find inaccuracies, I apologize.  Please bring any concerns or inaccuracies to your next appointment.  Please remember that medical language and documentation is very different from how you and I speak and our dictation software is not perfect.    General Instructions:  ??? How to reach me:   Please send a MyChart message to the General Medicine clinic or call Christine at [phone].    ??? How to get a medication refill:  Please use the MyChart Refill request or contact your pharmacy directly to request medication refills. Please allow 48 hours.     ??? How to receive your test results:  If you have signed up for MyChart, you will receive your test results and messages from me this way.  Otherwise, you will get a phone call or letter.   If you are expecting

## 2018-04-12 ENCOUNTER — Encounter: Admit: 2018-04-12 | Discharge: 2018-04-12 | Payer: MEDICARE

## 2018-04-12 MED ORDER — BUMETANIDE 2 MG PO TAB
2 mg | ORAL_TABLET | Freq: Two times a day (BID) | ORAL | 3 refills | Status: AC
Start: 2018-04-12 — End: 2018-04-17

## 2018-04-13 ENCOUNTER — Encounter: Admit: 2018-04-13 | Discharge: 2018-04-13 | Payer: MEDICARE

## 2018-04-15 ENCOUNTER — Encounter: Admit: 2018-04-15 | Discharge: 2018-04-15 | Payer: MEDICARE

## 2018-04-15 DIAGNOSIS — I255 Ischemic cardiomyopathy: Principal | ICD-10-CM

## 2018-04-15 LAB — BASIC METABOLIC PANEL
Lab: 1.4 (ref >=0.8-1.3)
Lab: 26
Lab: 45 — ABNORMAL HIGH (ref >=7-18)
Lab: 52 — ABNORMAL LOW
Lab: 8.8
Lab: 100
Lab: 138
Lab: 192 — ABNORMAL HIGH (ref >=65-110)
Lab: 4

## 2018-04-15 MED ORDER — POTASSIUM CHLORIDE 20 MEQ PO TBTQ
ORAL_TABLET | Freq: Two times a day (BID) | 0 refills
Start: 2018-04-15 — End: ?

## 2018-04-16 ENCOUNTER — Encounter: Admit: 2018-04-16 | Discharge: 2018-04-16 | Payer: MEDICARE

## 2018-04-16 DIAGNOSIS — M47816 Spondylosis without myelopathy or radiculopathy, lumbar region: Principal | ICD-10-CM

## 2018-04-17 ENCOUNTER — Encounter: Admit: 2018-04-17 | Discharge: 2018-04-17 | Payer: MEDICARE

## 2018-04-17 MED ORDER — BUMETANIDE 2 MG PO TAB
2 mg | ORAL_TABLET | Freq: Two times a day (BID) | ORAL | 3 refills | Status: AC
Start: 2018-04-17 — End: 2018-08-05

## 2018-04-17 MED ORDER — POTASSIUM CHLORIDE 20 MEQ PO TBTQ
20 meq | ORAL_TABLET | Freq: Every day | ORAL | 3 refills | 30.00000 days | Status: AC
Start: 2018-04-17 — End: 2018-08-05

## 2018-04-17 NOTE — Telephone Encounter
Patient will get BMP drawn on 3/3 at [facility].

## 2018-04-17 NOTE — Progress Notes
Patient called for routine CM follow up.   Wife reports he has not been feeling well x 2 days.   Ed states that he has a non productive cough and chest tightness, like wheezing.   No known sick contacts and no fever.   He is using everything prescribed to treat symptoms.   Weight today 280.8.  Was 281.4# yesterday .   States this is his baseline dry weight.   Refusing appointment at this time.   States he will call to be seen if symptoms worsen.

## 2018-04-18 ENCOUNTER — Ambulatory Visit: Admit: 2018-04-18 | Discharge: 2018-04-19 | Payer: MEDICARE

## 2018-04-18 MED ORDER — POTASSIUM CHLORIDE 20 MEQ PO TBTQ
ORAL_TABLET | Freq: Two times a day (BID) | 0 refills
Start: 2018-04-18 — End: ?

## 2018-04-19 DIAGNOSIS — I5042 Chronic combined systolic (congestive) and diastolic (congestive) heart failure: Principal | ICD-10-CM

## 2018-04-22 ENCOUNTER — Encounter: Admit: 2018-04-22 | Discharge: 2018-04-22 | Payer: MEDICARE

## 2018-04-22 MED ORDER — DOXYCYCLINE MONOHYDRATE 100 MG PO CAP
ORAL_CAPSULE | Freq: Every day | 0 refills | 10.00000 days | Status: AC
Start: 2018-04-22 — End: 2018-07-17

## 2018-04-23 ENCOUNTER — Encounter: Admit: 2018-04-23 | Discharge: 2018-04-23 | Payer: MEDICARE

## 2018-04-23 ENCOUNTER — Ambulatory Visit: Admit: 2018-04-23 | Discharge: 2018-04-24 | Payer: MEDICARE

## 2018-04-23 DIAGNOSIS — I5042 Chronic combined systolic (congestive) and diastolic (congestive) heart failure: ICD-10-CM

## 2018-04-23 DIAGNOSIS — E782 Mixed hyperlipidemia: ICD-10-CM

## 2018-04-23 DIAGNOSIS — Z8614 Personal history of Methicillin resistant Staphylococcus aureus infection: ICD-10-CM

## 2018-04-23 DIAGNOSIS — I10 Essential (primary) hypertension: ICD-10-CM

## 2018-04-23 DIAGNOSIS — J449 Chronic obstructive pulmonary disease, unspecified: ICD-10-CM

## 2018-04-23 DIAGNOSIS — J45909 Unspecified asthma, uncomplicated: ICD-10-CM

## 2018-04-23 DIAGNOSIS — I509 Heart failure, unspecified: Principal | ICD-10-CM

## 2018-04-23 DIAGNOSIS — M47816 Spondylosis without myelopathy or radiculopathy, lumbar region: Principal | ICD-10-CM

## 2018-04-23 DIAGNOSIS — J189 Pneumonia, unspecified organism: ICD-10-CM

## 2018-04-23 DIAGNOSIS — R0600 Dyspnea, unspecified: ICD-10-CM

## 2018-04-23 DIAGNOSIS — I714 Abdominal aortic aneurysm, without rupture: ICD-10-CM

## 2018-04-23 DIAGNOSIS — G4733 Obstructive sleep apnea (adult) (pediatric): ICD-10-CM

## 2018-04-23 DIAGNOSIS — Z9981 Dependence on supplemental oxygen: ICD-10-CM

## 2018-04-23 DIAGNOSIS — N401 Enlarged prostate with lower urinary tract symptoms: ICD-10-CM

## 2018-04-23 DIAGNOSIS — J479 Bronchiectasis, uncomplicated: ICD-10-CM

## 2018-04-23 DIAGNOSIS — L03116 Cellulitis of left lower limb: ICD-10-CM

## 2018-04-23 DIAGNOSIS — K219 Gastro-esophageal reflux disease without esophagitis: ICD-10-CM

## 2018-04-23 DIAGNOSIS — Z8679 Personal history of other diseases of the circulatory system: ICD-10-CM

## 2018-04-23 DIAGNOSIS — M961 Postlaminectomy syndrome, not elsewhere classified: ICD-10-CM

## 2018-04-23 DIAGNOSIS — I429 Cardiomyopathy, unspecified: ICD-10-CM

## 2018-04-23 DIAGNOSIS — M954 Acquired deformity of chest and rib: ICD-10-CM

## 2018-04-23 DIAGNOSIS — R609 Edema, unspecified: ICD-10-CM

## 2018-04-23 DIAGNOSIS — N183 Chronic kidney disease, stage 3 (moderate): ICD-10-CM

## 2018-04-23 DIAGNOSIS — J302 Other seasonal allergic rhinitis: ICD-10-CM

## 2018-04-23 DIAGNOSIS — I2510 Atherosclerotic heart disease of native coronary artery without angina pectoris: Secondary | ICD-10-CM

## 2018-04-23 DIAGNOSIS — R05 Cough: ICD-10-CM

## 2018-04-23 DIAGNOSIS — Z95828 Presence of other vascular implants and grafts: ICD-10-CM

## 2018-04-23 LAB — BASIC METABOLIC PANEL
Lab: 1.3 mg/dL — ABNORMAL HIGH (ref 0.4–1.24)
Lab: 10 10*3/uL (ref 3–12)
Lab: 101 MMOL/L (ref >60)
Lab: 3.9 MMOL/L — ABNORMAL LOW (ref >60)
Lab: 49 mL/min — ABNORMAL LOW (ref >60)
Lab: 60 mL/min — ABNORMAL LOW (ref >60)
Lab: 9.5 mg/dL (ref 8.5–10.6)

## 2018-04-23 LAB — PROSTATIC SPECIFIC ANTIGEN-PSA: Lab: 11 ng/mL — ABNORMAL HIGH (ref <6.01)

## 2018-04-23 MED ORDER — LIDOCAINE (PF) 20 MG/ML (2 %) IJ SOLN
5 mL | Freq: Once | INTRAMUSCULAR | 0 refills | Status: CP
Start: 2018-04-23 — End: ?
  Administered 2018-04-23: 19:00:00 5 mL via INTRAMUSCULAR

## 2018-04-23 MED ORDER — LIDOCAINE (PF) 10 MG/ML (1 %) IJ SOLN
2 mL | Freq: Once | INTRAMUSCULAR | 0 refills | Status: DC
Start: 2018-04-23 — End: 2018-04-23

## 2018-04-23 NOTE — Progress Notes
SPINE CENTER  INTERVENTIONAL PAIN PROCEDURE HISTORY AND PHYSICAL    Chief Complaint   Patient presents with   ??? Lower Back - Pain       HISTORY OF PRESENT ILLNESS:  Ruben Wood. is a 78 y.o. year old male who presents for injection.  Denies fevers, chills, or recent hospitalizations.  No allergies to contrast, latex, seafood, iodine, or shellfish.  Patient denies blood thinning medications.        Medical History:   Diagnosis Date   ??? AAA (abdominal aortic aneurysm) (HCC) 10/15/2012   ??? Asthma    ??? BPH with obstruction/lower urinary tract symptoms    ??? Bronchiectasis (HCC)    ??? CAD (coronary artery disease), native coronary artery 08/15/2012    07/11/2002- Cath @ [facility] showed Severe double vessel disease(OMB of circumflex and distal circ)  inferior-basilar dyskinesis.                 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  09/12/12   Cath    ??? Cardiomyopathy (HCC) 07/19/2014   ??? Cellulitis of left lower extremity 11/12/2014    10/2014: admitted with cellulitis, [place] negative for venous clot, MRI without osteomyelitis.    11/12/2014 In clinic today, still with cellulitis.  Per patient and his wife, the erythema may be extending.  Cultures from drainage in hospital with MSSA, but Blood Cx negative.  No e/o osteomyelitis.  My concern is that this antibiotic regimen is not adequately treating his cellulitis.  We will broaden coverage to get MRSA with doxycycline.  Patient and wife given strict call/return criteria.  I will see patient in 3-4 days for re-evaluation.  No fever today.  Plan:  Stop cefpodoxime and start doxycylcine  11/17/2014 Much better, no warmth and erythema minimal.   Plan: continue doxycyline for full 10 day course.      ??? 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 male appears stated age, in no acute distress  HEENT: Normocephalic, atraumatic  Neck: No thyroidmegaly  Cardiovascular: Well perfused  Pulmonary: Unlabored respirations  Extremities: No cyanosis, clubbing, or edema  Skin: Warm and dry  Psychiatric:  Appropriate mood and affect  Musculoskeletal: No atrophy  Neurologic: Antigravity strength in all extremities        IMPRESSION:    1. Lumbar spondylosis         PLAN:   Bilateral L3, L4, L5 MBB, #2/2

## 2018-04-24 ENCOUNTER — Ambulatory Visit: Admit: 2018-04-23 | Discharge: 2018-04-23 | Payer: MEDICARE

## 2018-04-24 ENCOUNTER — Ambulatory Visit: Admit: 2018-04-23 | Discharge: 2018-04-24 | Payer: MEDICARE

## 2018-04-24 ENCOUNTER — Encounter: Admit: 2018-04-24 | Discharge: 2018-04-24 | Payer: MEDICARE

## 2018-04-24 DIAGNOSIS — Z95810 Presence of automatic (implantable) cardiac defibrillator: ICD-10-CM

## 2018-04-24 DIAGNOSIS — Z8249 Family history of ischemic heart disease and other diseases of the circulatory system: ICD-10-CM

## 2018-04-24 DIAGNOSIS — Z87891 Personal history of nicotine dependence: ICD-10-CM

## 2018-04-24 DIAGNOSIS — M47816 Spondylosis without myelopathy or radiculopathy, lumbar region: Principal | ICD-10-CM

## 2018-04-24 DIAGNOSIS — Z8614 Personal history of Methicillin resistant Staphylococcus aureus infection: ICD-10-CM

## 2018-04-24 DIAGNOSIS — R972 Elevated prostate specific antigen [PSA]: ICD-10-CM

## 2018-04-24 DIAGNOSIS — Z955 Presence of coronary angioplasty implant and graft: ICD-10-CM

## 2018-04-24 MED ORDER — ATORVASTATIN 40 MG PO TAB
ORAL_TABLET | Freq: Every day | 3 refills | Status: AC
Start: 2018-04-24 — End: 2019-06-27

## 2018-04-29 ENCOUNTER — Encounter: Admit: 2018-04-29 | Discharge: 2018-04-29 | Payer: MEDICARE

## 2018-04-30 ENCOUNTER — Ambulatory Visit: Admit: 2018-04-02 | Discharge: 2018-04-02 | Payer: MEDICARE

## 2018-04-30 ENCOUNTER — Encounter: Admit: 2018-04-30 | Discharge: 2018-04-30 | Payer: MEDICARE

## 2018-04-30 ENCOUNTER — Ambulatory Visit: Admit: 2018-04-30 | Discharge: 2018-04-30 | Payer: MEDICARE

## 2018-04-30 ENCOUNTER — Ambulatory Visit: Admit: 2018-04-30 | Discharge: 2018-05-01 | Payer: MEDICARE

## 2018-04-30 DIAGNOSIS — Z95810 Presence of automatic (implantable) cardiac defibrillator: ICD-10-CM

## 2018-04-30 DIAGNOSIS — K219 Gastro-esophageal reflux disease without esophagitis: ICD-10-CM

## 2018-04-30 DIAGNOSIS — I2510 Atherosclerotic heart disease of native coronary artery without angina pectoris: ICD-10-CM

## 2018-04-30 DIAGNOSIS — Z7982 Long term (current) use of aspirin: ICD-10-CM

## 2018-04-30 DIAGNOSIS — Z8614 Personal history of Methicillin resistant Staphylococcus aureus infection: ICD-10-CM

## 2018-04-30 DIAGNOSIS — J189 Pneumonia, unspecified organism: ICD-10-CM

## 2018-04-30 DIAGNOSIS — M961 Postlaminectomy syndrome, not elsewhere classified: ICD-10-CM

## 2018-04-30 DIAGNOSIS — J479 Bronchiectasis, uncomplicated: ICD-10-CM

## 2018-04-30 DIAGNOSIS — R609 Edema, unspecified: ICD-10-CM

## 2018-04-30 DIAGNOSIS — Z9981 Dependence on supplemental oxygen: ICD-10-CM

## 2018-04-30 DIAGNOSIS — R05 Cough: ICD-10-CM

## 2018-04-30 DIAGNOSIS — Z8249 Family history of ischemic heart disease and other diseases of the circulatory system: ICD-10-CM

## 2018-04-30 DIAGNOSIS — G5622 Lesion of ulnar nerve, left upper limb: Principal | ICD-10-CM

## 2018-04-30 DIAGNOSIS — I714 Abdominal aortic aneurysm, without rupture: ICD-10-CM

## 2018-04-30 DIAGNOSIS — Z7902 Long term (current) use of antithrombotics/antiplatelets: ICD-10-CM

## 2018-04-30 DIAGNOSIS — Z87891 Personal history of nicotine dependence: ICD-10-CM

## 2018-04-30 DIAGNOSIS — G4733 Obstructive sleep apnea (adult) (pediatric): ICD-10-CM

## 2018-04-30 DIAGNOSIS — E782 Mixed hyperlipidemia: ICD-10-CM

## 2018-04-30 DIAGNOSIS — N183 Chronic kidney disease, stage 3 (moderate): ICD-10-CM

## 2018-04-30 DIAGNOSIS — J449 Chronic obstructive pulmonary disease, unspecified: ICD-10-CM

## 2018-04-30 DIAGNOSIS — I130 Hypertensive heart and chronic kidney disease with heart failure and stage 1 through stage 4 chronic kidney disease, or unspecified chronic kidney disease: ICD-10-CM

## 2018-04-30 DIAGNOSIS — I5042 Chronic combined systolic (congestive) and diastolic (congestive) heart failure: ICD-10-CM

## 2018-04-30 DIAGNOSIS — M4807 Spinal stenosis, lumbosacral region: ICD-10-CM

## 2018-04-30 DIAGNOSIS — I429 Cardiomyopathy, unspecified: ICD-10-CM

## 2018-04-30 DIAGNOSIS — Z8679 Personal history of other diseases of the circulatory system: ICD-10-CM

## 2018-04-30 DIAGNOSIS — L03116 Cellulitis of left lower limb: ICD-10-CM

## 2018-04-30 DIAGNOSIS — I10 Essential (primary) hypertension: ICD-10-CM

## 2018-04-30 DIAGNOSIS — N401 Enlarged prostate with lower urinary tract symptoms: ICD-10-CM

## 2018-04-30 DIAGNOSIS — Z95828 Presence of other vascular implants and grafts: ICD-10-CM

## 2018-04-30 DIAGNOSIS — J45909 Unspecified asthma, uncomplicated: ICD-10-CM

## 2018-04-30 DIAGNOSIS — R0600 Dyspnea, unspecified: ICD-10-CM

## 2018-04-30 DIAGNOSIS — J302 Other seasonal allergic rhinitis: ICD-10-CM

## 2018-04-30 DIAGNOSIS — M954 Acquired deformity of chest and rib: ICD-10-CM

## 2018-04-30 DIAGNOSIS — I509 Heart failure, unspecified: Principal | ICD-10-CM

## 2018-04-30 MED ORDER — PROPOFOL 10 MG/ML IV EMUL 20 ML (INFUSION)(AM)(OR)
INTRAVENOUS | 0 refills | Status: DC
Start: 2018-04-30 — End: 2018-04-30
  Administered 2018-04-30: 19:00:00 100 ug/kg/min via INTRAVENOUS

## 2018-04-30 MED ORDER — SODIUM CHLORIDE 0.9 % IV SOLP
0 refills | Status: DC
Start: 2018-04-30 — End: 2018-04-30
  Administered 2018-04-30: 19:00:00 via INTRAVENOUS

## 2018-04-30 MED ORDER — HYDROCODONE-ACETAMINOPHEN 5-325 MG PO TAB
1 | ORAL_TABLET | ORAL | 0 refills | 15.00000 days | Status: AC | PRN
Start: 2018-04-30 — End: 2018-12-26

## 2018-04-30 MED ORDER — ROPIVACAINE (PF) 5 MG/ML (0.5 %) IJ SOLN
0 refills | Status: CP
Start: 2018-04-30 — End: ?
  Administered 2018-04-30: 18:00:00 10 mL

## 2018-04-30 MED ORDER — LIDOCAINE (PF) 10 MG/ML (1 %) IJ SOLN
SUBCUTANEOUS | 0 refills | Status: CP
Start: 2018-04-30 — End: ?
  Administered 2018-04-30: 18:00:00 2 mL via SUBCUTANEOUS

## 2018-04-30 MED ORDER — METOCLOPRAMIDE HCL 5 MG/ML IJ SOLN
10 mg | Freq: Once | INTRAVENOUS | 0 refills | Status: DC | PRN
Start: 2018-04-30 — End: 2018-05-01

## 2018-04-30 MED ORDER — MEPIVACAINE (PF) 15 MG/ML (1.5 %) IJ SOLN
0 refills | Status: CP
Start: 2018-04-30 — End: ?
  Administered 2018-04-30: 18:00:00 10 mL

## 2018-04-30 MED ORDER — OXYCODONE 5 MG PO TAB
5-10 mg | Freq: Once | ORAL | 0 refills | Status: DC | PRN
Start: 2018-04-30 — End: 2018-05-01

## 2018-04-30 MED ORDER — LIDOCAINE (PF) 200 MG/10 ML (2 %) IJ SYRG
0 refills | Status: DC
Start: 2018-04-30 — End: 2018-04-30
  Administered 2018-04-30: 19:00:00 40 mg via INTRAVENOUS

## 2018-04-30 MED ORDER — LIDOCAINE (PF) 10 MG/ML (1 %) IJ SOLN
SUBCUTANEOUS | 0 refills | Status: CP
Start: 2018-04-30 — End: ?
  Administered 2018-04-30: 18:00:00 5 mL via SUBCUTANEOUS

## 2018-04-30 MED ORDER — HALOPERIDOL LACTATE 5 MG/ML IJ SOLN
1 mg | Freq: Once | INTRAVENOUS | 0 refills | Status: DC | PRN
Start: 2018-04-30 — End: 2018-05-01

## 2018-04-30 MED ORDER — LIDOCAINE (PF) 20 MG/ML (2 %) IJ SOLN
0 refills | Status: CP
Start: 2018-04-30 — End: ?
  Administered 2018-04-30: 18:00:00 15 mL

## 2018-04-30 MED ORDER — FENTANYL CITRATE (PF) 50 MCG/ML IJ SOLN
50 ug | INTRAVENOUS | 0 refills | Status: DC | PRN
Start: 2018-04-30 — End: 2018-05-01

## 2018-04-30 MED ORDER — ACETAMINOPHEN 500 MG PO TAB
1000 mg | Freq: Once | ORAL | 0 refills | Status: CP
Start: 2018-04-30 — End: ?
  Administered 2018-04-30: 18:00:00 1000 mg via ORAL

## 2018-04-30 MED ORDER — ROPIVACAINE (PF) 2 MG/ML (0.2 %) IJ SOLN
0 refills | Status: DC
Start: 2018-04-30 — End: 2018-04-30
  Administered 2018-04-30: 20:00:00 6.5 mL via INTRAMUSCULAR

## 2018-04-30 MED ORDER — LIDOCAINE (PF) 10 MG/ML (1 %) IJ SOLN
.1-2 mL | INTRAMUSCULAR | 0 refills | Status: DC | PRN
Start: 2018-04-30 — End: 2018-05-01

## 2018-04-30 MED ORDER — CEFAZOLIN INJ 1GM IVP
2 g | Freq: Once | INTRAVENOUS | 0 refills | Status: CP
Start: 2018-04-30 — End: ?
  Administered 2018-04-30: 19:00:00 2 g via INTRAVENOUS

## 2018-04-30 MED ORDER — SODIUM CHLORIDE 0.9 % IV SOLP
1000 mL | Freq: Once | INTRAVENOUS | 0 refills | Status: CP
Start: 2018-04-30 — End: ?

## 2018-04-30 MED ORDER — PROMETHAZINE 25 MG/ML IJ SOLN
6.25 mg | INTRAVENOUS | 0 refills | Status: DC | PRN
Start: 2018-04-30 — End: 2018-05-01

## 2018-04-30 MED ORDER — FENTANYL CITRATE (PF) 50 MCG/ML IJ SOLN
25 ug | INTRAVENOUS | 0 refills | Status: DC | PRN
Start: 2018-04-30 — End: 2018-05-01

## 2018-04-30 MED ADMIN — SODIUM CHLORIDE 0.9 % IV SOLP [27838]: 1000 mL | INTRAVENOUS | @ 18:00:00 | Stop: 2018-04-30 | NDC 00338004904

## 2018-05-01 ENCOUNTER — Encounter: Admit: 2018-05-01 | Discharge: 2018-05-01 | Payer: MEDICARE

## 2018-05-01 NOTE — Progress Notes
CM attempted to call patient to check in following left ulnar nerve decompression at elbow.   General message left on voice mail.   Will attempt next outreach in approximately 1 month.

## 2018-05-02 ENCOUNTER — Encounter: Admit: 2018-05-02 | Discharge: 2018-05-02 | Payer: MEDICARE

## 2018-05-02 DIAGNOSIS — Z95828 Presence of other vascular implants and grafts: ICD-10-CM

## 2018-05-02 DIAGNOSIS — J302 Other seasonal allergic rhinitis: ICD-10-CM

## 2018-05-02 DIAGNOSIS — N401 Enlarged prostate with lower urinary tract symptoms: ICD-10-CM

## 2018-05-02 DIAGNOSIS — L03116 Cellulitis of left lower limb: ICD-10-CM

## 2018-05-02 DIAGNOSIS — N183 Chronic kidney disease, stage 3 (moderate): ICD-10-CM

## 2018-05-02 DIAGNOSIS — I509 Heart failure, unspecified: Principal | ICD-10-CM

## 2018-05-02 DIAGNOSIS — J45909 Unspecified asthma, uncomplicated: ICD-10-CM

## 2018-05-02 DIAGNOSIS — M961 Postlaminectomy syndrome, not elsewhere classified: ICD-10-CM

## 2018-05-02 DIAGNOSIS — Z8614 Personal history of Methicillin resistant Staphylococcus aureus infection: ICD-10-CM

## 2018-05-02 DIAGNOSIS — K219 Gastro-esophageal reflux disease without esophagitis: ICD-10-CM

## 2018-05-02 DIAGNOSIS — J479 Bronchiectasis, uncomplicated: ICD-10-CM

## 2018-05-02 DIAGNOSIS — E782 Mixed hyperlipidemia: ICD-10-CM

## 2018-05-02 DIAGNOSIS — R0600 Dyspnea, unspecified: ICD-10-CM

## 2018-05-02 DIAGNOSIS — R05 Cough: ICD-10-CM

## 2018-05-02 DIAGNOSIS — J189 Pneumonia, unspecified organism: ICD-10-CM

## 2018-05-02 DIAGNOSIS — G4733 Obstructive sleep apnea (adult) (pediatric): ICD-10-CM

## 2018-05-02 DIAGNOSIS — M954 Acquired deformity of chest and rib: ICD-10-CM

## 2018-05-02 DIAGNOSIS — Z8679 Personal history of other diseases of the circulatory system: ICD-10-CM

## 2018-05-02 DIAGNOSIS — I429 Cardiomyopathy, unspecified: ICD-10-CM

## 2018-05-02 DIAGNOSIS — I10 Essential (primary) hypertension: ICD-10-CM

## 2018-05-02 DIAGNOSIS — J449 Chronic obstructive pulmonary disease, unspecified: ICD-10-CM

## 2018-05-02 DIAGNOSIS — I5042 Chronic combined systolic (congestive) and diastolic (congestive) heart failure: ICD-10-CM

## 2018-05-02 DIAGNOSIS — R609 Edema, unspecified: ICD-10-CM

## 2018-05-02 DIAGNOSIS — Z9981 Dependence on supplemental oxygen: ICD-10-CM

## 2018-05-02 DIAGNOSIS — I714 Abdominal aortic aneurysm, without rupture: ICD-10-CM

## 2018-05-02 DIAGNOSIS — I2510 Atherosclerotic heart disease of native coronary artery without angina pectoris: ICD-10-CM

## 2018-05-04 ENCOUNTER — Encounter: Admit: 2018-05-04 | Discharge: 2018-05-04 | Payer: MEDICARE

## 2018-05-06 ENCOUNTER — Encounter: Admit: 2018-05-06 | Discharge: 2018-05-06 | Payer: MEDICARE

## 2018-05-06 DIAGNOSIS — M47816 Spondylosis without myelopathy or radiculopathy, lumbar region: Principal | ICD-10-CM

## 2018-05-07 ENCOUNTER — Encounter: Admit: 2018-05-07 | Discharge: 2018-05-07 | Payer: MEDICARE

## 2018-05-08 ENCOUNTER — Encounter: Admit: 2018-05-08 | Discharge: 2018-05-08 | Payer: MEDICARE

## 2018-05-09 ENCOUNTER — Encounter: Admit: 2018-05-09 | Discharge: 2018-05-09 | Payer: MEDICARE

## 2018-05-09 DIAGNOSIS — I5042 Chronic combined systolic (congestive) and diastolic (congestive) heart failure: Principal | ICD-10-CM

## 2018-05-09 LAB — BASIC METABOLIC PANEL
Lab: 1.2
Lab: 138
Lab: 4
Lab: 41
Lab: 9.4
Lab: 99
Lab: 118
Lab: 28
Lab: 62

## 2018-05-13 ENCOUNTER — Encounter: Admit: 2018-05-13 | Discharge: 2018-05-13 | Payer: MEDICARE

## 2018-05-14 ENCOUNTER — Encounter: Admit: 2018-05-14 | Discharge: 2018-05-14 | Payer: MEDICARE

## 2018-05-15 ENCOUNTER — Encounter: Admit: 2018-05-15 | Discharge: 2018-05-15 | Payer: MEDICARE

## 2018-05-17 ENCOUNTER — Encounter: Admit: 2018-05-17 | Discharge: 2018-05-17 | Payer: MEDICARE

## 2018-05-17 MED ORDER — PANTOPRAZOLE 40 MG PO TBEC
ORAL_TABLET | Freq: Two times a day (BID) | ORAL | 9 refills | 90.00000 days | Status: AC
Start: 2018-05-17 — End: 2019-06-27

## 2018-05-20 ENCOUNTER — Ambulatory Visit: Admit: 2018-05-20 | Discharge: 2018-05-20 | Payer: MEDICARE

## 2018-05-20 ENCOUNTER — Encounter: Admit: 2018-05-20 | Discharge: 2018-05-20 | Payer: MEDICARE

## 2018-05-20 DIAGNOSIS — I5042 Chronic combined systolic (congestive) and diastolic (congestive) heart failure: Principal | ICD-10-CM

## 2018-05-20 DIAGNOSIS — R972 Elevated prostate specific antigen [PSA]: Principal | ICD-10-CM

## 2018-05-20 NOTE — Telephone Encounter
Ruben Wood that Ed received his [employer] vest, and feels it is working really well. They have been on home quarantine for weeks, with exception of occasional [place] country.

## 2018-05-23 ENCOUNTER — Encounter: Admit: 2018-05-23 | Discharge: 2018-05-23 | Payer: MEDICARE

## 2018-05-23 DIAGNOSIS — 1 ERRONEOUS ENCOUNTER--DISREGARD: Principal | ICD-10-CM

## 2018-05-24 ENCOUNTER — Encounter: Admit: 2018-05-24 | Discharge: 2018-05-24 | Payer: MEDICARE

## 2018-05-27 ENCOUNTER — Encounter: Admit: 2018-05-27 | Discharge: 2018-05-27 | Payer: MEDICARE

## 2018-05-28 ENCOUNTER — Encounter: Admit: 2018-05-28 | Discharge: 2018-05-28 | Payer: MEDICARE

## 2018-05-28 DIAGNOSIS — M5416 Radiculopathy, lumbar region: ICD-10-CM

## 2018-05-28 DIAGNOSIS — M4726 Other spondylosis with radiculopathy, lumbar region: Principal | ICD-10-CM

## 2018-05-28 MED ORDER — GABAPENTIN 400 MG PO CAP
400 mg | ORAL_CAPSULE | ORAL | 3 refills | Status: AC
Start: 2018-05-28 — End: ?
  Filled 2018-05-28 (×4): qty 50, 4d supply, fill #1

## 2018-05-28 NOTE — Telephone Encounter
Patient states he was previously taking gabapentin 400mg  q8hours.    Current prescription is for 100mg  bid.   States he is having trouble sleeping due to pain in feet.   Requesting increased dosing due to pain.   Will route to PCP to advise.

## 2018-05-30 ENCOUNTER — Encounter: Admit: 2018-05-30 | Discharge: 2018-05-30 | Payer: MEDICARE

## 2018-06-04 ENCOUNTER — Encounter: Admit: 2018-06-04 | Discharge: 2018-06-04 | Payer: MEDICARE

## 2018-06-05 ENCOUNTER — Encounter: Admit: 2018-06-05 | Discharge: 2018-06-05 | Payer: MEDICARE

## 2018-06-05 ENCOUNTER — Ambulatory Visit: Admit: 2018-06-05 | Discharge: 2018-06-06 | Payer: MEDICARE

## 2018-06-05 DIAGNOSIS — I5042 Chronic combined systolic (congestive) and diastolic (congestive) heart failure: ICD-10-CM

## 2018-06-05 DIAGNOSIS — J189 Pneumonia, unspecified organism: ICD-10-CM

## 2018-06-05 DIAGNOSIS — Z95828 Presence of other vascular implants and grafts: ICD-10-CM

## 2018-06-05 DIAGNOSIS — I2510 Atherosclerotic heart disease of native coronary artery without angina pectoris: ICD-10-CM

## 2018-06-05 DIAGNOSIS — R609 Edema, unspecified: ICD-10-CM

## 2018-06-05 DIAGNOSIS — J449 Chronic obstructive pulmonary disease, unspecified: ICD-10-CM

## 2018-06-05 DIAGNOSIS — Z9981 Dependence on supplemental oxygen: ICD-10-CM

## 2018-06-05 DIAGNOSIS — J45909 Unspecified asthma, uncomplicated: ICD-10-CM

## 2018-06-05 DIAGNOSIS — M961 Postlaminectomy syndrome, not elsewhere classified: ICD-10-CM

## 2018-06-05 DIAGNOSIS — I509 Heart failure, unspecified: Principal | ICD-10-CM

## 2018-06-05 DIAGNOSIS — K219 Gastro-esophageal reflux disease without esophagitis: ICD-10-CM

## 2018-06-05 DIAGNOSIS — I714 Abdominal aortic aneurysm, without rupture: ICD-10-CM

## 2018-06-05 DIAGNOSIS — G4733 Obstructive sleep apnea (adult) (pediatric): ICD-10-CM

## 2018-06-05 DIAGNOSIS — M954 Acquired deformity of chest and rib: ICD-10-CM

## 2018-06-05 DIAGNOSIS — I10 Essential (primary) hypertension: ICD-10-CM

## 2018-06-05 DIAGNOSIS — N401 Enlarged prostate with lower urinary tract symptoms: ICD-10-CM

## 2018-06-05 DIAGNOSIS — R05 Cough: ICD-10-CM

## 2018-06-05 DIAGNOSIS — R0600 Dyspnea, unspecified: ICD-10-CM

## 2018-06-05 DIAGNOSIS — J302 Other seasonal allergic rhinitis: ICD-10-CM

## 2018-06-05 DIAGNOSIS — I429 Cardiomyopathy, unspecified: ICD-10-CM

## 2018-06-05 DIAGNOSIS — J479 Bronchiectasis, uncomplicated: ICD-10-CM

## 2018-06-05 DIAGNOSIS — Z8679 Personal history of other diseases of the circulatory system: ICD-10-CM

## 2018-06-05 DIAGNOSIS — E782 Mixed hyperlipidemia: ICD-10-CM

## 2018-06-05 DIAGNOSIS — L03116 Cellulitis of left lower limb: ICD-10-CM

## 2018-06-05 DIAGNOSIS — N183 Chronic kidney disease, stage 3 (moderate): ICD-10-CM

## 2018-06-05 DIAGNOSIS — Z8614 Personal history of Methicillin resistant Staphylococcus aureus infection: ICD-10-CM

## 2018-06-05 NOTE — Progress Notes
Daily CardioMEMS Readings    Goal PA Diastolic: 75-30 mmHg   PA Diastolic Thresholds: 0-51 mmHg

## 2018-06-05 NOTE — Progress Notes
???   doxycycline (MONODOX) 100 mg capsule Take one capsule by mouth daily.   ??? doxycycline (VIBRAMYCIN) 100 mg capsule Take 100 mg by mouth daily. For prophylaxis   ??? duloxetine DR (CYMBALTA) 60 mg capsule Take one capsule by mouth daily.   ??? finasteride (PROSCAR) 5 mg tablet Take one tablet by mouth daily.   ??? fish oil- omega 3-DHA/EPA 300/1,000 mg capsule Take 1 capsule by mouth daily.   ??? gabapentin (NEURONTIN) 400 mg capsule Take one capsule by mouth every 8 hours for 90 days.   ??? HYDROcodone/acetaminophen (NORCO) 5/325 mg tablet Take one tablet by mouth every 4 hours as needed for Pain   ??? ipratropium bromide (ATROVENT) 42 mcg (0.06 %) nasal spray 1 spray to each nostril Q6H PRN   ??? midodrine (PROAMATINE) 5 mg tablet Take one tablet by mouth twice daily. Take AM dose first thing in AM, and second dose at 2-3 PM.   ??? milk of magnesia (CONC) 2,400 mg/10 mL oral suspension Take  by mouth daily as needed for Heartburn.   ??? montelukast (SINGULAIR) 10 mg tablet Take one tablet by mouth at bedtime daily.   ??? nitroglycerin (NITROSTAT) 0.4 mg tablet Place 0.4 mg under tongue every 5 minutes as needed for Chest Pain. Max of 3 tablets, call 911.   ??? pantoprazole DR (PROTONIX) 40 mg tablet TAKE (1) TABLET BY MOUTH TWO TIMES A DAY.   ??? polyethylene glycol 3350 (MIRALAX) 17 g packet Take one packet by mouth as Needed.   ??? potassium chloride SR (K-DUR) 20 mEq tablet Take one tablet by mouth daily. Take with a meal and a full glass of water.   ??? sodium chloride 3 % nebulizer solution Inhale 4 mL by mouth into the lungs twice daily. Use with [place].  Indications: asthma, chronic cough, bronchiectasis   ??? spironolactone (ALDACTONE) 25 mg tablet Take one tablet by mouth twice daily. Take with food.   ??? tamsulosin (FLOMAX) 0.4 mg capsule Take 1 capsule by mouth daily.   ??? traMADol (ULTRAM) 50 mg tablet Take one tablet by mouth every 8 hours as needed for Pain.

## 2018-06-06 ENCOUNTER — Encounter: Admit: 2018-06-06 | Discharge: 2018-06-06 | Payer: MEDICARE

## 2018-06-06 DIAGNOSIS — I2510 Atherosclerotic heart disease of native coronary artery without angina pectoris: Secondary | ICD-10-CM

## 2018-06-06 DIAGNOSIS — I714 Abdominal aortic aneurysm, without rupture: Principal | ICD-10-CM

## 2018-06-17 ENCOUNTER — Encounter: Admit: 2018-06-17 | Discharge: 2018-06-17 | Payer: MEDICARE

## 2018-06-17 DIAGNOSIS — I5042 Chronic combined systolic (congestive) and diastolic (congestive) heart failure: Principal | ICD-10-CM

## 2018-06-18 ENCOUNTER — Ambulatory Visit: Admit: 2018-06-18 | Discharge: 2018-06-19 | Payer: MEDICARE

## 2018-06-18 ENCOUNTER — Encounter: Admit: 2018-06-18 | Discharge: 2018-06-18 | Payer: MEDICARE

## 2018-06-18 DIAGNOSIS — M954 Acquired deformity of chest and rib: ICD-10-CM

## 2018-06-18 DIAGNOSIS — I2510 Atherosclerotic heart disease of native coronary artery without angina pectoris: Secondary | ICD-10-CM

## 2018-06-18 DIAGNOSIS — R0600 Dyspnea, unspecified: ICD-10-CM

## 2018-06-18 DIAGNOSIS — J449 Chronic obstructive pulmonary disease, unspecified: ICD-10-CM

## 2018-06-18 DIAGNOSIS — J302 Other seasonal allergic rhinitis: ICD-10-CM

## 2018-06-18 DIAGNOSIS — R609 Edema, unspecified: ICD-10-CM

## 2018-06-18 DIAGNOSIS — N401 Enlarged prostate with lower urinary tract symptoms: ICD-10-CM

## 2018-06-18 DIAGNOSIS — E782 Mixed hyperlipidemia: ICD-10-CM

## 2018-06-18 DIAGNOSIS — I714 Abdominal aortic aneurysm, without rupture: ICD-10-CM

## 2018-06-18 DIAGNOSIS — N183 Chronic kidney disease, stage 3 (moderate): ICD-10-CM

## 2018-06-18 DIAGNOSIS — L03116 Cellulitis of left lower limb: ICD-10-CM

## 2018-06-18 DIAGNOSIS — Z95828 Presence of other vascular implants and grafts: ICD-10-CM

## 2018-06-18 DIAGNOSIS — J479 Bronchiectasis, uncomplicated: ICD-10-CM

## 2018-06-18 DIAGNOSIS — Z8614 Personal history of Methicillin resistant Staphylococcus aureus infection: ICD-10-CM

## 2018-06-18 DIAGNOSIS — J45909 Unspecified asthma, uncomplicated: ICD-10-CM

## 2018-06-18 DIAGNOSIS — J471 Bronchiectasis with (acute) exacerbation: Principal | ICD-10-CM

## 2018-06-18 DIAGNOSIS — K219 Gastro-esophageal reflux disease without esophagitis: ICD-10-CM

## 2018-06-18 DIAGNOSIS — M961 Postlaminectomy syndrome, not elsewhere classified: ICD-10-CM

## 2018-06-18 DIAGNOSIS — I5042 Chronic combined systolic (congestive) and diastolic (congestive) heart failure: ICD-10-CM

## 2018-06-18 DIAGNOSIS — J189 Pneumonia, unspecified organism: ICD-10-CM

## 2018-06-18 DIAGNOSIS — I429 Cardiomyopathy, unspecified: ICD-10-CM

## 2018-06-18 DIAGNOSIS — I10 Essential (primary) hypertension: ICD-10-CM

## 2018-06-18 DIAGNOSIS — Z9981 Dependence on supplemental oxygen: ICD-10-CM

## 2018-06-18 DIAGNOSIS — Z8679 Personal history of other diseases of the circulatory system: ICD-10-CM

## 2018-06-18 DIAGNOSIS — G4733 Obstructive sleep apnea (adult) (pediatric): ICD-10-CM

## 2018-06-18 DIAGNOSIS — I509 Heart failure, unspecified: Principal | ICD-10-CM

## 2018-06-18 DIAGNOSIS — R05 Cough: ICD-10-CM

## 2018-06-19 ENCOUNTER — Encounter: Admit: 2018-06-19 | Discharge: 2018-06-19 | Payer: MEDICARE

## 2018-06-19 DIAGNOSIS — J4541 Moderate persistent asthma with (acute) exacerbation: ICD-10-CM

## 2018-06-19 DIAGNOSIS — K219 Gastro-esophageal reflux disease without esophagitis: ICD-10-CM

## 2018-06-19 DIAGNOSIS — G4733 Obstructive sleep apnea (adult) (pediatric): ICD-10-CM

## 2018-06-19 DIAGNOSIS — Z9989 Dependence on other enabling machines and devices: ICD-10-CM

## 2018-06-19 DIAGNOSIS — J9612 Chronic respiratory failure with hypercapnia: ICD-10-CM

## 2018-06-19 DIAGNOSIS — R05 Cough: ICD-10-CM

## 2018-06-19 DIAGNOSIS — I5042 Chronic combined systolic (congestive) and diastolic (congestive) heart failure: ICD-10-CM

## 2018-06-19 DIAGNOSIS — Z6841 Body Mass Index (BMI) 40.0 and over, adult: ICD-10-CM

## 2018-06-19 DIAGNOSIS — J479 Bronchiectasis, uncomplicated: Principal | ICD-10-CM

## 2018-06-20 ENCOUNTER — Encounter: Admit: 2018-06-20 | Discharge: 2018-06-20 | Payer: MEDICARE

## 2018-06-20 ENCOUNTER — Ambulatory Visit: Admit: 2018-06-20 | Discharge: 2018-06-20 | Payer: MEDICARE

## 2018-06-20 DIAGNOSIS — I5042 Chronic combined systolic (congestive) and diastolic (congestive) heart failure: Principal | ICD-10-CM

## 2018-07-04 ENCOUNTER — Encounter: Admit: 2018-07-04 | Discharge: 2018-07-04 | Payer: MEDICARE

## 2018-07-08 ENCOUNTER — Ambulatory Visit: Admit: 2018-07-08 | Discharge: 2018-07-08 | Payer: MEDICARE

## 2018-07-08 ENCOUNTER — Encounter: Admit: 2018-07-08 | Discharge: 2018-07-08 | Payer: MEDICARE

## 2018-07-08 DIAGNOSIS — J479 Bronchiectasis, uncomplicated: ICD-10-CM

## 2018-07-08 DIAGNOSIS — I714 Abdominal aortic aneurysm, without rupture: ICD-10-CM

## 2018-07-08 DIAGNOSIS — R0600 Dyspnea, unspecified: ICD-10-CM

## 2018-07-08 DIAGNOSIS — J302 Other seasonal allergic rhinitis: ICD-10-CM

## 2018-07-08 DIAGNOSIS — L03116 Cellulitis of left lower limb: ICD-10-CM

## 2018-07-08 DIAGNOSIS — R609 Edema, unspecified: ICD-10-CM

## 2018-07-08 DIAGNOSIS — M961 Postlaminectomy syndrome, not elsewhere classified: ICD-10-CM

## 2018-07-08 DIAGNOSIS — K219 Gastro-esophageal reflux disease without esophagitis: ICD-10-CM

## 2018-07-08 DIAGNOSIS — N183 Chronic kidney disease, stage 3 (moderate): ICD-10-CM

## 2018-07-08 DIAGNOSIS — R05 Cough: ICD-10-CM

## 2018-07-08 DIAGNOSIS — J189 Pneumonia, unspecified organism: ICD-10-CM

## 2018-07-08 DIAGNOSIS — D801 Nonfamilial hypogammaglobulinemia: ICD-10-CM

## 2018-07-08 DIAGNOSIS — J310 Chronic rhinitis: Principal | ICD-10-CM

## 2018-07-08 DIAGNOSIS — E782 Mixed hyperlipidemia: ICD-10-CM

## 2018-07-08 DIAGNOSIS — I10 Essential (primary) hypertension: ICD-10-CM

## 2018-07-08 DIAGNOSIS — G4733 Obstructive sleep apnea (adult) (pediatric): ICD-10-CM

## 2018-07-08 DIAGNOSIS — B999 Unspecified infectious disease: Secondary | ICD-10-CM

## 2018-07-08 DIAGNOSIS — I509 Heart failure, unspecified: Principal | ICD-10-CM

## 2018-07-08 DIAGNOSIS — I5042 Chronic combined systolic (congestive) and diastolic (congestive) heart failure: ICD-10-CM

## 2018-07-08 DIAGNOSIS — J449 Chronic obstructive pulmonary disease, unspecified: ICD-10-CM

## 2018-07-08 DIAGNOSIS — N401 Enlarged prostate with lower urinary tract symptoms: ICD-10-CM

## 2018-07-08 DIAGNOSIS — I2510 Atherosclerotic heart disease of native coronary artery without angina pectoris: Secondary | ICD-10-CM

## 2018-07-08 DIAGNOSIS — Z8614 Personal history of Methicillin resistant Staphylococcus aureus infection: ICD-10-CM

## 2018-07-08 DIAGNOSIS — I429 Cardiomyopathy, unspecified: ICD-10-CM

## 2018-07-08 DIAGNOSIS — Z8679 Personal history of other diseases of the circulatory system: ICD-10-CM

## 2018-07-08 DIAGNOSIS — Z9981 Dependence on supplemental oxygen: ICD-10-CM

## 2018-07-08 DIAGNOSIS — H109 Unspecified conjunctivitis: ICD-10-CM

## 2018-07-08 DIAGNOSIS — J45909 Unspecified asthma, uncomplicated: ICD-10-CM

## 2018-07-08 DIAGNOSIS — M954 Acquired deformity of chest and rib: ICD-10-CM

## 2018-07-08 DIAGNOSIS — Z95828 Presence of other vascular implants and grafts: ICD-10-CM

## 2018-07-08 NOTE — Progress Notes
Telehealth Visit Note    Date of Service: 07/08/2018    Subjective:      Obtained patient's verbal consent to treat them and their agreement to [facility] financial policy and NPP via this telehealth visit during the [facility]       Ruben Wood. Ruben Wood is a 78 y.o. male. With recurrent infections (Cellulitis and Pneumonia with bronchiectasis and intermittent hypogammaglobulinemia     He is doing quite well since our last visit in Jan of this year without any recurrent infections or hospitalizations. He has been compliant with doxycycline without sxs of pill esophagitis or any issues. No new cellulitis since then and no new pneumonia. No use of steroids since our last visit either and has been in his usual state of health and actually feels very well. Still reports allergic symptoms with rhinorrhea, congestion, post nasal drip, but no cough and is able to clear his secretions much better than before with the percussion vest that he was able to attain through pulmonary. Him and his wife have been mostly staying home with the pandemic and have not had any major issues.     Uses his albuterol rescue inhaler about once or twice a week and does not have nightly awakenings, he really does not have even monthly ones. His major limitation/issue is Dyspnea on exertion that is not associated with chest tightness or orthopnea no PND either and has not changed for the last 6 to 8 months. He attributes it to his deconditioning and obesity.     Rhinitis Control Assessment Test  #1. During the past week, how often did you have nasal congestion?: 3 (07/08/2018 10:21 AM)  #2. During the past week, how often did you sneeze?: 2 (07/08/2018 10:21 AM)  #3. During the past week, how often did you have watery eyes?: 1 (07/08/2018 10:21 AM)  #4. During the past week, to what extent did your nasal or other allergy symptoms interfere with your sleep?: 5 (07/08/2018 10:21 AM) #5. During the past week, how well were your nasal or other allergy symptoms controlled?: 3 (07/08/2018 10:21 AM)  #6. During the past week, how often did you avoid any activities (for example, visiting a house with a dog or cat, or gardening) because of your nasal or other allergy symptoms?: 3 (07/08/2018 10:21 AM)  RCAT Total Score: 17 (07/08/2018 10:21 AM)  ???  Asthma Control Test  #1. In the past 4 weeks, how much of the time did your asthma keep you from getting as much done at work, school or at home?: 5 (07/08/2018 10:19 AM)  #2. During the past 4 weeks, how often have you had shortness of breath?: 1 (07/08/2018 10:19 AM)  #3. During the past 4 weeks, how often did your asthma symptoms (wheezing, coughing, shortness of breath, chest tightness or pain) wake you up at night or earlier than usual in the morning?: 5 (07/08/2018 10:19 AM)  #4. During the past 4 weeks, how often have you used your rescue inhaler or nebulizer medication (such as albuterol)?: 2 (07/08/2018 10:19 AM)  #5. How would you rate your asthma control during the past 4 weeks?: 2 (07/08/2018 10:19 AM)  Total Score: 15 (07/08/2018 10:19 AM)       Review of Systems   Constitutional: Negative for chills and fever.   HENT: Positive for congestion, postnasal drip and rhinorrhea.    Eyes: Positive for itching.   Respiratory: Positive for cough, shortness of breath and wheezing. Negative  for chest tightness.    Cardiovascular: Positive for leg swelling. Negative for chest pain.   Gastrointestinal: Positive for nausea. Negative for abdominal pain and vomiting.   Genitourinary: Negative.    Musculoskeletal: Negative.    Skin: Negative.      Objective:         ??? acetaminophen (TYLENOL) 325 mg tablet Take two tablets by mouth every 6 hours as needed for Pain.   ??? albuterol 0.083% (PROVENTIL; VENTOLIN) 2.5 mg /3 mL (0.083 %) nebulizer solution Inhale 3 mL solution by nebulizer as directed every 6 hours as mouth every 4 hours as needed for Pain   ??? ipratropium bromide (ATROVENT) 42 mcg (0.06 %) nasal spray 1 spray to each nostril Q6H PRN   ??? midodrine (PROAMATINE) 5 mg tablet Take one tablet by mouth twice daily. Take AM dose first thing in AM, and second dose at 2-3 PM.   ??? milk of magnesia (CONC) 2,400 mg/10 mL oral suspension Take  by mouth daily as needed for Heartburn.   ??? montelukast (SINGULAIR) 10 mg tablet Take one tablet by mouth at bedtime daily.   ??? nitroglycerin (NITROSTAT) 0.4 mg tablet Place 0.4 mg under tongue every 5 minutes as needed for Chest Pain. Max of 3 tablets, call 911.   ??? pantoprazole DR (PROTONIX) 40 mg tablet TAKE (1) TABLET BY MOUTH TWO TIMES A DAY.   ??? polyethylene glycol 3350 (MIRALAX) 17 g packet Take one packet by mouth as Needed.   ??? potassium chloride SR (K-DUR) 20 mEq tablet Take one tablet by mouth daily. Take with a meal and a full glass of water.   ??? sodium chloride 3 % nebulizer solution Inhale 4 mL by mouth into the lungs twice daily. Use with [place].  Indications: asthma, chronic cough, bronchiectasis   ??? spironolactone (ALDACTONE) 25 mg tablet Take one tablet by mouth twice daily. Take with food.   ??? tamsulosin (FLOMAX) 0.4 mg capsule Take 1 capsule by mouth daily.   ??? traMADol (ULTRAM) 50 mg tablet Take one tablet by mouth every 8 hours as needed for Pain.   ??? zinc oxide 20 % topical ointment Place twice a day on the buttock wound     There were no vitals filed for this visit.  There is no height or weight on file to calculate BMI.     Physical Exam  Constitutional:       General: He is not in acute distress.     Appearance: Normal appearance. He is well-developed. He is obese.   HENT:      Head: Normocephalic and atraumatic.   Eyes:      Extraocular Movements: Extraocular movements intact.   Neck:      Musculoskeletal: Normal range of motion.   Pulmonary:      Effort: Pulmonary effort is normal.      Breath sounds: Normal breath sounds.   Neurological: General: No focal deficit present.      Mental Status: He is alert and oriented to person, place, and time. Mental status is at baseline.   Psychiatric:         Mood and Affect: Mood normal.            Assessment and Plan:    Problem   Hypogammaglobulinemia (Hcc)    02/2016 and 06/2016 - He has IgG 636 with normal IgA and IgM. He had a normal response to pneumococca vaccination; he had 17/23 positive serotypes. He has normal tetanus toxoid Ab and  CH50.  His T&B cell panel revealed a mildly low B-cell count at 68 right after a time of acute illness.  He had positive diphtheria antibody.    Likely multifactorial with significant comorbidities including frequent steroid use for bronchiectasis exacerbations, frequent infections, and possibly a nutritional component (low total protein).     Ref. Range 02/25/2016 09/14/2016 02/28/2017 04/19/2017 05/05/2017  09/11/17   IgG 762 - 1,488 MG/DL 454 (L) 098 (L) 119 (L) 472 (L) 492 (L) 737     I suspect his low levels are due to acute illness as they are often checked when he is actively ill, or has had a lot of steroids. IgA and IgM are within normal limits.    Over the past 2 years, he is continued to get ill within a week or 2 of discontinuing antibiotic therapy.  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 Prophylactic antimicrobial management with Doxycycline has been beneficial for him over past 3 months. He had 1 episode of pneumonia requiring higher-strength Levofloxacin; hospitalization subsequent to this infection was for CHF and fluid overload.     - Continue doxycycline 100 mg daily  - Recommend getting the pneumovax immunization next time he is in clinic in person and will need Repeat pneumococcal antibody levels 1 month afterwards.  Hopefully, he will have an improved response tnow that he is not on higher doses of steroids.      Chronic Rhinoconjunctivitis about the patient.    Hyman Bower, DO  Associate Professor  Division of Allergy, Immunology, and Rheumatology  Department of Medicine and Department of Pediatrics  [facility]        Total time 25 minutes.  Estimated counseling time 15 minutes. Additional time required for coordination of care.

## 2018-07-14 ENCOUNTER — Encounter: Admit: 2018-07-14 | Discharge: 2018-07-14 | Payer: MEDICARE

## 2018-07-14 DIAGNOSIS — R609 Edema, unspecified: ICD-10-CM

## 2018-07-14 DIAGNOSIS — L03116 Cellulitis of left lower limb: ICD-10-CM

## 2018-07-14 DIAGNOSIS — R0600 Dyspnea, unspecified: ICD-10-CM

## 2018-07-14 DIAGNOSIS — I714 Abdominal aortic aneurysm, without rupture: ICD-10-CM

## 2018-07-14 DIAGNOSIS — G4733 Obstructive sleep apnea (adult) (pediatric): ICD-10-CM

## 2018-07-14 DIAGNOSIS — I2510 Atherosclerotic heart disease of native coronary artery without angina pectoris: ICD-10-CM

## 2018-07-14 DIAGNOSIS — Z8614 Personal history of Methicillin resistant Staphylococcus aureus infection: ICD-10-CM

## 2018-07-14 DIAGNOSIS — N183 Chronic kidney disease, stage 3 (moderate): ICD-10-CM

## 2018-07-14 DIAGNOSIS — K219 Gastro-esophageal reflux disease without esophagitis: ICD-10-CM

## 2018-07-14 DIAGNOSIS — E782 Mixed hyperlipidemia: ICD-10-CM

## 2018-07-14 DIAGNOSIS — Z9981 Dependence on supplemental oxygen: ICD-10-CM

## 2018-07-14 DIAGNOSIS — M954 Acquired deformity of chest and rib: ICD-10-CM

## 2018-07-14 DIAGNOSIS — Z8679 Personal history of other diseases of the circulatory system: ICD-10-CM

## 2018-07-14 DIAGNOSIS — I5042 Chronic combined systolic (congestive) and diastolic (congestive) heart failure: ICD-10-CM

## 2018-07-14 DIAGNOSIS — J189 Pneumonia, unspecified organism: ICD-10-CM

## 2018-07-14 DIAGNOSIS — N401 Enlarged prostate with lower urinary tract symptoms: ICD-10-CM

## 2018-07-14 DIAGNOSIS — I429 Cardiomyopathy, unspecified: ICD-10-CM

## 2018-07-14 DIAGNOSIS — J479 Bronchiectasis, uncomplicated: ICD-10-CM

## 2018-07-14 DIAGNOSIS — R05 Cough: ICD-10-CM

## 2018-07-14 DIAGNOSIS — I509 Heart failure, unspecified: Principal | ICD-10-CM

## 2018-07-14 DIAGNOSIS — J45909 Unspecified asthma, uncomplicated: ICD-10-CM

## 2018-07-14 DIAGNOSIS — I10 Essential (primary) hypertension: ICD-10-CM

## 2018-07-14 DIAGNOSIS — M961 Postlaminectomy syndrome, not elsewhere classified: ICD-10-CM

## 2018-07-14 DIAGNOSIS — Z95828 Presence of other vascular implants and grafts: ICD-10-CM

## 2018-07-14 DIAGNOSIS — J302 Other seasonal allergic rhinitis: ICD-10-CM

## 2018-07-14 DIAGNOSIS — J449 Chronic obstructive pulmonary disease, unspecified: ICD-10-CM

## 2018-07-16 ENCOUNTER — Encounter: Admit: 2018-07-16 | Discharge: 2018-07-16 | Payer: MEDICARE

## 2018-07-17 ENCOUNTER — Encounter: Admit: 2018-07-17 | Discharge: 2018-07-17 | Payer: MEDICARE

## 2018-07-17 MED ORDER — DOXYCYCLINE MONOHYDRATE 100 MG PO CAP
ORAL_CAPSULE | Freq: Every day | 0 refills | 10.00000 days | Status: DC
Start: 2018-07-17 — End: 2018-10-07

## 2018-07-19 ENCOUNTER — Encounter: Admit: 2018-07-19 | Discharge: 2018-07-19 | Payer: MEDICARE

## 2018-07-22 ENCOUNTER — Ambulatory Visit: Admit: 2018-07-22 | Discharge: 2018-07-22

## 2018-07-22 ENCOUNTER — Encounter: Admit: 2018-07-22 | Discharge: 2018-07-22

## 2018-07-22 DIAGNOSIS — I5042 Chronic combined systolic (congestive) and diastolic (congestive) heart failure: Principal | ICD-10-CM

## 2018-07-22 DIAGNOSIS — J470 Bronchiectasis with acute lower respiratory infection: Secondary | ICD-10-CM

## 2018-07-22 DIAGNOSIS — Z1159 Encounter for screening for other viral diseases: Secondary | ICD-10-CM

## 2018-07-22 DIAGNOSIS — J302 Other seasonal allergic rhinitis: Secondary | ICD-10-CM

## 2018-07-22 DIAGNOSIS — Z20828 Contact with and (suspected) exposure to other viral communicable diseases: Secondary | ICD-10-CM

## 2018-07-23 ENCOUNTER — Encounter: Admit: 2018-07-23 | Discharge: 2018-07-23

## 2018-07-23 ENCOUNTER — Ambulatory Visit: Admit: 2018-07-23 | Discharge: 2018-07-23

## 2018-07-23 DIAGNOSIS — Z20828 Contact with and (suspected) exposure to other viral communicable diseases: Secondary | ICD-10-CM

## 2018-07-23 DIAGNOSIS — Z1159 Encounter for screening for other viral diseases: Secondary | ICD-10-CM

## 2018-07-23 DIAGNOSIS — J470 Bronchiectasis with acute lower respiratory infection: Principal | ICD-10-CM

## 2018-07-23 DIAGNOSIS — J302 Other seasonal allergic rhinitis: Secondary | ICD-10-CM

## 2018-07-23 MED ORDER — AZITHROMYCIN 250 MG PO TAB
ORAL_TABLET | Freq: Every day | 0 refills | Status: DC
Start: 2018-07-23 — End: 2018-08-06

## 2018-07-23 MED ORDER — PREDNISONE 20 MG PO TAB
40 mg | ORAL_TABLET | Freq: Every day | ORAL | 0 refills | Status: AC
Start: 2018-07-23 — End: ?

## 2018-07-23 NOTE — Telephone Encounter
Patient's wife called concerned as her husband is having issues with congestion, sore throat, and ear pain.  No openings available today.    Routing to Dr. Crecencio Mc to advise if a telehealth visit tomorrow with Sue Lush is ok or if he prefers me to advise differently.    Etta Grandchild, RN

## 2018-07-23 NOTE — Telephone Encounter
Can he do a telehealth with me at 12:40pm today?  If not, tomorrow will have to do.

## 2018-07-23 NOTE — Telephone Encounter
Patient's wife called requesting COVID testing order be faxed to [facility] @ [phone].  I also e-mailed this to [email or web address]    I called patient's wife to make her aware this has been transmitted successfully.    Etta Grandchild, RN

## 2018-07-23 NOTE — Telephone Encounter
I called patient's wife back and they agreed to 12:40 pm telehealth visit today with Dr. Crecencio Mc.    Etta Grandchild, RN

## 2018-07-23 NOTE — Patient Instructions
Future Appointments   Date Time Provider Department Center   08/02/2018 11:00 AM Vanetta Shawl I, MD [facility] CVM Exam   08/06/2018  2:00 PM Curly Rim, MD [facility] SPINE   08/22/2018 12:00 AM MAC REMOTE MONITORING MACREMOTEHRM CVM Procedur   09/23/2018 12:00 AM [facility] REMOTE MONITORING MACREMOTEHRM CVM Procedur   10/24/2018 12:00 AM [facility] REMOTE MONITORING MACREMOTEHRM CVM Procedur   11/25/2018 12:00 AM [facility] REMOTE MONITORING MACREMOTEHRM CVM Procedur   12/02/2018 11:15 AM [name], Lenice Llamas, PA-C [facility]rology   12/26/2018 12:00 AM MAC REMOTE MONITORING MACREMOTEHRM CVM Procedur   01/27/2019 12:00 AM [facility] REMOTE MONITORING MACREMOTEHRM CVM Procedur   02/27/2019 12:00 AM [facility] REMOTE MONITORING MACREMOTEHRM CVM Procedur   03/31/2019 12:00 AM [facility] REMOTE MONITORING MACREMOTEHRM CVM Procedur   05/01/2019 12:00 AM [facility] REMOTE MONITORING MACREMOTEHRM CVM Procedur   06/02/2019 12:00 AM [facility] REMOTE MONITORING MACREMOTEHRM CVM Procedur   07/03/2019 12:00 AM [facility] REMOTE MONITORING MACREMOTEHRM CVM Procedur   08/04/2019 12:00 AM MAC REMOTE MONITORING MACREMOTEHRM CVM Procedur   09/04/2019 12:00 AM MAC REMOTE MONITORING MACREMOTEHRM CVM Procedur   10/06/2019 12:00 AM MAC REMOTE MONITORING MACREMOTEHRM CVM Procedur   11/06/2019 12:00 AM MAC REMOTE MONITORING MACREMOTEHRM CVM Procedur   12/08/2019 12:00 AM MAC REMOTE MONITORING MACREMOTEHRM CVM Procedur   01/08/2020 12:00 AM MAC REMOTE MONITORING MACREMOTEHRM CVM Procedur   02/09/2020 12:00 AM MAC REMOTE MONITORING MACREMOTEHRM CVM Procedur   03/11/2020 12:00 AM MAC REMOTE MONITORING MACREMOTEHRM CVM Procedur   04/12/2020 12:00 AM MAC REMOTE MONITORING MACREMOTEHRM CVM Procedur   05/13/2020 12:00 AM MAC REMOTE MONITORING MACREMOTEHRM CVM Procedur   06/14/2020 12:00 AM [facility] REMOTE MONITORING MACREMOTEHRM CVM Procedur       If you are on Mychart, you will now be able to read your clinic note from me.  My hope is that this will improve your engagement and insight into your health care and improve the accuracy of your medical record.  If you find inaccuracies, I apologize.  Please bring any concerns or inaccuracies to your next appointment.  Please remember that medical language and documentation is very different from how you and I speak and our dictation software is not perfect.    General Instructions:  ??? How to reach me:   Please send a MyChart message to the General Medicine clinic or call Christine at [phone].    ??? How to get a medication refill:  Please use the MyChart Refill request or contact your pharmacy directly to request medication refills. Please allow 48 hours.     ??? How to receive your test results:  If you have signed up for MyChart, you will receive your test results and messages from me this way.  Otherwise, you will get a phone call or letter.   If you are expecting results and have not heard from my office within 2 weeks of your testing, please send a MyChart message or call my office.     ??? Scheduling:  Our Scheduling phone number is [phone].  Same Day appointments are usually available. Please ask for an annual or yearly physical appointment if it has been over 1 year since our last appointment.  ??? Appointment Reminders on your cell phone: Make sure we have your cell phone number, and Text [place] to [phone].  ??? Support groups for many chronic illnesses are available through [facility]: [email or web address] or [phone].

## 2018-07-23 NOTE — Progress Notes
Did patient read financial policy, consent to treat, and notice of privacy practices? Yes    Does the patient give verbal consent to each policy? Yes    Does the patient have any vitals to report? Yes    Weight and Temperature Vitals charted in O2? Yes    Is the patient in pain? 8/10    Screening questions completed? Yes    Is the patient able to acces the Mychart message with the start visit link? Yes    Is patient in "virtual waiting room" Yes    Asthma attack on Sunday,  Congestion, right ear pain with fullness.    Etta Grandchild, RN

## 2018-07-23 NOTE — Progress Notes
History of Present Illness  Ruben Wood. is a 78 y.o. male with multiple medical problems including CAD, Systolic and Diastolic HF, HLD, OSA on CPAP, morbid obesity, chronic pain, and COPD who presents today for comprehensive follow up.    Asthma attack over the weekend.   Sore throat x 1 day, ery sore, congested in right nostril and right ear. He is coughing more than baseline.  He is hoarse.  Increased SOB from baseline. No F/C. On doxycycline suppressive therapy for his bronchietasis.  Ed thinks this is allergy related as it started after sitting outside over the weekend.        Review of Systems   HENT: Positive for congestion, ear pain, rhinorrhea and sore throat. Negative for ear discharge.    Respiratory: Positive for cough and shortness of breath. Negative for chest tightness.    Cardiovascular: Negative for chest pain and leg swelling.   Gastrointestinal: Negative for diarrhea, nausea and vomiting.   Skin: Negative for wound.       Objective:         ??? acetaminophen (TYLENOL) 325 mg tablet Take two tablets by mouth every 6 hours as needed for Pain.   ??? albuterol 0.083% (PROVENTIL; VENTOLIN) 2.5 mg /3 mL (0.083 %) nebulizer solution Inhale 3 mL solution by nebulizer as directed every 6 hours as needed for Wheezing or Shortness of Breath. Indications: asthma   ??? albuterol sulfate (PROAIR HFA) 90 mcg/actuation aerosol inhaler Inhale two puffs by mouth into the lungs every 4 hours as needed for Wheezing or Shortness of Breath.   ??? allopurinol (ZYLOPRIM) 300 mg tablet Take one tablet by mouth daily. Take with food.   ??? AMITR/GABAPEN/EMU OIL 05/24/08% CREAM (COMPOUND) Apply 5 grams (10 pumps) topically to affected area three times daily.   ??? Ammonium Lactate-Emu Oil (EMU-LAC) 10 % crea Apply  topically to affected area. Apply to feet    ??? ascorbic acid (VITAMIN C) 500 mg tablet Take one tablet by mouth daily.   ??? aspirin EC 81 mg tablet Take one tablet by mouth daily. Take with food. ??? atorvastatin (LIPITOR) 40 mg tablet TAKE (1) TABLET BY MOUTH ONCE DAILY   ??? benzonatate (TESSALON PERLES) 100 mg capsule Take two capsules by mouth every 8 hours as needed for Cough.   ??? budesonide/formoterol (SYMBICORT HFA) 160/4.5 mcg inhalation Inhale two puffs by mouth into the lungs twice daily. Rinse mouth after use.   ??? bumetanide (BUMEX) 2 mg tablet Take one tablet by mouth twice daily. Start on 04/12/2018   ??? cholecalciferol (VITAMIN D-3) 1,000 units tablet Take 1,000 Units by mouth daily.   ??? clopiDOGrel (PLAVIX) 75 mg tablet Take one tablet by mouth daily.   ??? docusate (COLACE) 100 mg capsule Take 100 mg by mouth daily as needed for Constipation.   ??? doxycycline (MONODOX) 100 mg capsule Take one capsule by mouth daily.   ??? doxycycline (VIBRAMYCIN) 100 mg capsule Take 100 mg by mouth daily. For prophylaxis   ??? duloxetine DR (CYMBALTA) 60 mg capsule Take one capsule by mouth daily.   ??? finasteride (PROSCAR) 5 mg tablet Take one tablet by mouth daily.   ??? fish oil- omega 3-DHA/EPA 300/1,000 mg capsule Take 1 capsule by mouth daily.   ??? gabapentin (NEURONTIN) 400 mg capsule Take one capsule by mouth every 8 hours for 90 days.   ??? HYDROcodone/acetaminophen (NORCO) 5/325 mg tablet Take one tablet by mouth every 4 hours as needed for Pain   ???  ipratropium bromide (ATROVENT) 42 mcg (0.06 %) nasal spray 1 spray to each nostril Q6H PRN   ??? midodrine (PROAMATINE) 5 mg tablet Take one tablet by mouth twice daily. Take AM dose first thing in AM, and second dose at 2-3 PM.   ??? milk of magnesia (CONC) 2,400 mg/10 mL oral suspension Take  by mouth daily as needed for Heartburn.   ??? montelukast (SINGULAIR) 10 mg tablet Take one tablet by mouth at bedtime daily.   ??? nitroglycerin (NITROSTAT) 0.4 mg tablet Place 0.4 mg under tongue every 5 minutes as needed for Chest Pain. Max of 3 tablets, call 911.   ??? pantoprazole DR (PROTONIX) 40 mg tablet TAKE (1) TABLET BY MOUTH TWO TIMES A DAY. ??? polyethylene glycol 3350 (MIRALAX) 17 g packet Take one packet by mouth as Needed.   ??? potassium chloride SR (K-DUR) 20 mEq tablet Take one tablet by mouth daily. Take with a meal and a full glass of water.   ??? sodium chloride 3 % nebulizer solution Inhale 4 mL by mouth into the lungs twice daily. Use with [place].  Indications: asthma, chronic cough, bronchiectasis   ??? spironolactone (ALDACTONE) 25 mg tablet Take one tablet by mouth twice daily. Take with food.   ??? tamsulosin (FLOMAX) 0.4 mg capsule Take 1 capsule by mouth daily.   ??? traMADol (ULTRAM) 50 mg tablet Take one tablet by mouth every 8 hours as needed for Pain.   ??? zinc oxide 20 % topical ointment Place twice a day on the buttock wound     There were no vitals filed for this visit.    Body mass index is 41.84 kg/m???.     Wt Readings from Last 5 Encounters:   07/23/18 136.1 kg (300 lb)   06/18/18 131.5 kg (290 lb)   06/05/18 127.9 kg (282 lb)   04/30/18 128.2 kg (282 lb 9.6 oz)   04/23/18 130.6 kg (288 lb)       Physical Exam  HENT:      Head: Normocephalic and atraumatic.   Pulmonary:      Effort: Pulmonary effort is normal.   Neurological:      General: No focal deficit present.      Mental Status: He is alert.   Psychiatric:         Mood and Affect: Mood normal.       Labs Reviewed:   Lab Results   Component Value Date/Time    HGBA1C 6.8 (H) 04/03/2017 04:39 AM    HGBA1C 5.4 02/25/2016 11:50 AM    HGBA1C 6.2 (H) 03/01/2015 02:18 PM    HGBPOC 11.2 (L) 10/25/2012 10:58 AM    HGBPOC 12.9 (L) 10/25/2012 08:55 AM    TSH 2.160 03/01/2015 02:18 PM    FREET4R 0.99 12/15/2014 09:12 AM    CHOL 74 06/29/2017 08:06 AM    TRIG 161 (H) 06/29/2017 08:06 AM    HDL 28 (L) 06/29/2017 08:06 AM    LDL 34 06/29/2017 08:06 AM    NA 138 05/09/2018 11:25 AM    K 4.0 05/09/2018 11:25 AM    CL 99 05/09/2018 11:25 AM    CO2 28.8 05/09/2018 11:25 AM    GAP 10 04/23/2018 11:45 AM    BUN 41 05/09/2018 11:25 AM    CR 1.2 05/09/2018 11:25 AM    GLU 118 05/09/2018 11:25 AM CA 9.4 05/09/2018 11:25 AM    PO4 2.7 11/19/2017 04:58 AM    ALBUMIN 3.2 (L) 03/04/2018 04:59 AM  TOTPROT 5.4 (L) 03/04/2018 04:59 AM    ALKPHOS 78 03/04/2018 04:59 AM    AST 13 03/04/2018 04:59 AM    ALT 12 03/04/2018 04:59 AM    TOTBILI 0.6 03/04/2018 04:59 AM    GFR 62 05/09/2018 11:25 AM    GFRAA 60 (L) 04/23/2018 11:45 AM    PSA 11.60 (H) 04/23/2018 11:45 AM         Assessment and Plan:    1. Bronchiectasis with acute lower respiratory infection (HCC)    2. Seasonal allergies    3. Suspected COVID-19 virus infection      I think that he is having a more typical flare of his bronchiectasis most likely triggered from seasonal allergies.  However with a sore throat and ear pain, I do want him to get COVID-19 testing.  He lives outside of [place], so they are going to talk to their health department to see if we can get the COVID-19 testing done there rather than making him drive all the way down here.  I did put an order in that we can fax to their hospital or pelvic health department if needed.  They can call us back as soon as I get off the phone with them.  I am to treat this in the standard way that we have previously that has worked with prednisone and azithromycin.  He was given strict return/call/ED parameters in the event that his symptoms were to worsen.    RTC: 1-2 months for comprehensive follow up    There are no Patient Instructions on file for this visit.    No follow-ups on file.

## 2018-07-26 ENCOUNTER — Encounter: Admit: 2018-07-26 | Discharge: 2018-07-26

## 2018-07-26 NOTE — Telephone Encounter
In that case, we will continue to treat with the prednisone and azithromycin.

## 2018-07-26 NOTE — Telephone Encounter
I called the lab at [facility] to get COVID test results that were ordered a couple of days ago on this patient.    They state this gets sent out to [facility] and they do not have those results yet.  States it's 4 days.    Routing to Dr. Gibson Ramp as Farris Has, RN

## 2018-07-29 ENCOUNTER — Encounter: Admit: 2018-07-29 | Discharge: 2018-07-29

## 2018-07-29 DIAGNOSIS — Z1159 Encounter for screening for other viral diseases: Secondary | ICD-10-CM

## 2018-07-29 LAB — COVID-19 (SARS-COV-2) PCR
Lab: NEGATIVE
Lab: NEGATIVE

## 2018-07-29 NOTE — Telephone Encounter
COVID lab results received by fax which are negative.      Routing to Dr. Gibson Ramp to make aware.  I will notify the patient.    Myrtie Hawk, RN     I attempted to reach patient and it went to voicemail.  Per chart review I left information on answering machine as permission has been approved to do this per patient/ chart.  I advised to contact me with any further questions or concerns.  My contact information was provided.  Myrtie Hawk, RN

## 2018-07-29 NOTE — Telephone Encounter
Patient's wife returned my call stating that the [facility] notified them on Saturday and the Lab called them Sunday as well with the results.    She states Ruben Wood is feeling much better and the antibiotic and prednisone really took care of his symptoms.  She wanted to send her appreciation to [place] for getting him in right away and they were both very pleased to be able to complete through telehealth taking care of his symptoms so quickly.    Routing to Dr. Gibson Ramp as Farris Has, RN

## 2018-08-01 ENCOUNTER — Encounter: Admit: 2018-08-01 | Discharge: 2018-08-01

## 2018-08-02 ENCOUNTER — Encounter: Admit: 2018-08-02 | Discharge: 2018-08-02

## 2018-08-02 NOTE — Telephone Encounter
Patient had appointment scheduled with Dr. Manuella Ghazi today at 11:00. Wife was under the impression that the visit was a telehealth visit, not an in-office visit, so they will not make it to the appointment on time.   Wife reports patient has been dizzy the last few days. PA Diastolic is below thresholds on CardioMEMS (see below).    Current Meds:    --Bumex 2mg  BID  --KCl 13meq daily  --Spironolactone 25mg  BID       Reviewed with Dr. Manuella Ghazi. Dr. Manuella Ghazi would prefer to see patient in clinic rather than telehealth. V.O. from Dr. Manuella Ghazi:  Reschedule appointment to 6/19 at 13:00 (with [facility]).  BMP at appointment 6/19.  Instruct patient to hold bumex and potassium 6/12 thru 6/14. On 6/15,Restart bumex at 2mg  ONCE daily and potassium at 76meq once daily.  Notified wife of new orders. Wife verbalized understanding of instructions. Patient will arrive at 12:30 on 6/19 for labs.           Daily CardioMEMS Readings  Goal PA Diastolic: 62-95 mmHg   PA Diastolic Thresholds: 2-84 mmHg  Taken on PA Systolic PA Diastolic PA Mean Heart Rate   08-02-2018, 09:25 AM 25 mmHg 7 mmHg 12 mmHg 91 bpm   07-31-2018, 03:48 PM 24 mmHg 10 mmHg 14 mmHg 115 bpm   07-29-2018, 04:48 PM 25 mmHg 10 mmHg 15 mmHg 112 bpm   07-26-2018, 06:02 PM 29 mmHg 12 mmHg 18 mmHg 107 bpm   07-22-2018, 11:25 AM 23 mmHg 7 mmHg 12 mmHg 101 bpm   07-20-2018, 04:14 PM 26 mmHg 8 mmHg 14 mmHg 106 bpm   07-19-2018, 01:28 PM 27 mmHg 9 mmHg 15 mmHg 103 bpm   07-13-2018, 04:30 PM 31 mmHg 10 mmHg 17 mmHg 102 bpm   07-12-2018, 01:59 PM 29 mmHg 10 mmHg 17 mmHg 104 bpm

## 2018-08-02 NOTE — Progress Notes
Daily CardioMEMS Readings    Goal PA Diastolic: 94-50 mmHg   PA Diastolic Thresholds: 3-88 mmHg    Taken on PA Systolic PA Diastolic PA Mean Heart Rate   08-02-2018, 09:25 AM 25 mmHg 7 mmHg 12 mmHg 91 bpm   07-31-2018, 03:48 PM 24 mmHg 10 mmHg 14 mmHg 115 bpm   07-29-2018, 04:48 PM 25 mmHg 10 mmHg 15 mmHg 112 bpm   07-26-2018, 06:02 PM 29 mmHg 12 mmHg 18 mmHg 107 bpm   07-22-2018, 11:25 AM 23 mmHg 7 mmHg 12 mmHg 101 bpm   07-20-2018, 04:14 PM 26 mmHg 8 mmHg 14 mmHg 106 bpm   07-19-2018, 01:28 PM 27 mmHg 9 mmHg 15 mmHg 103 bpm   07-13-2018, 04:30 PM 31 mmHg 10 mmHg 17 mmHg 102 bpm   07-12-2018, 01:59 PM 29 mmHg 10 mmHg 17 mmHg 104 bpm   07-11-2018, 02:19 PM 20 mmHg 8 mmHg 12 mmHg 112 bpm   07-10-2018, 01:31 PM 26 mmHg 10 mmHg 15 mmHg 106 bpm   07-08-2018, 09:58 AM 26 mmHg 9 mmHg 15 mmHg 105 bpm   07-07-2018, 12:39 PM 29 mmHg 10 mmHg 16 mmHg 105 bpm   07-06-2018, 04:31 PM 35 mmHg 10 mmHg 19 mmHg 110 bpm   07-05-2018, 01:45 PM 25 mmHg 9 mmHg 14 mmHg 110 bpm   07-02-2018, 04:38 PM 31 mmHg 11 mmHg 17 mmHg 95 bpm   07-01-2018, 12:03 PM 25 mmHg 8 mmHg 14 mmHg 105 bpm   06-27-2018, 02:54 PM 21 mmHg 8 mmHg 12 mmHg 105 bpm   06-25-2018, 10:54 AM 35 mmHg 14 mmHg 22 mmHg 102 bpm

## 2018-08-05 ENCOUNTER — Encounter: Admit: 2018-08-05 | Discharge: 2018-08-05

## 2018-08-05 ENCOUNTER — Ambulatory Visit: Admit: 2018-08-05 | Discharge: 2018-08-05

## 2018-08-05 DIAGNOSIS — J449 Chronic obstructive pulmonary disease, unspecified: Secondary | ICD-10-CM

## 2018-08-05 DIAGNOSIS — N401 Enlarged prostate with lower urinary tract symptoms: Secondary | ICD-10-CM

## 2018-08-05 DIAGNOSIS — I509 Heart failure, unspecified: Secondary | ICD-10-CM

## 2018-08-05 DIAGNOSIS — E782 Mixed hyperlipidemia: Secondary | ICD-10-CM

## 2018-08-05 DIAGNOSIS — M961 Postlaminectomy syndrome, not elsewhere classified: Secondary | ICD-10-CM

## 2018-08-05 DIAGNOSIS — R0600 Dyspnea, unspecified: Secondary | ICD-10-CM

## 2018-08-05 DIAGNOSIS — R05 Cough: Secondary | ICD-10-CM

## 2018-08-05 DIAGNOSIS — K219 Gastro-esophageal reflux disease without esophagitis: Secondary | ICD-10-CM

## 2018-08-05 DIAGNOSIS — J45909 Unspecified asthma, uncomplicated: Secondary | ICD-10-CM

## 2018-08-05 DIAGNOSIS — I5042 Chronic combined systolic (congestive) and diastolic (congestive) heart failure: Secondary | ICD-10-CM

## 2018-08-05 DIAGNOSIS — Z95828 Presence of other vascular implants and grafts: Secondary | ICD-10-CM

## 2018-08-05 DIAGNOSIS — R609 Edema, unspecified: Secondary | ICD-10-CM

## 2018-08-05 DIAGNOSIS — I429 Cardiomyopathy, unspecified: Secondary | ICD-10-CM

## 2018-08-05 DIAGNOSIS — M954 Acquired deformity of chest and rib: Secondary | ICD-10-CM

## 2018-08-05 DIAGNOSIS — N183 Chronic kidney disease, stage 3 (moderate): Secondary | ICD-10-CM

## 2018-08-05 DIAGNOSIS — G4733 Obstructive sleep apnea (adult) (pediatric): Secondary | ICD-10-CM

## 2018-08-05 DIAGNOSIS — I714 Abdominal aortic aneurysm, without rupture: Secondary | ICD-10-CM

## 2018-08-05 DIAGNOSIS — J302 Other seasonal allergic rhinitis: Secondary | ICD-10-CM

## 2018-08-05 DIAGNOSIS — I10 Essential (primary) hypertension: Secondary | ICD-10-CM

## 2018-08-05 DIAGNOSIS — J479 Bronchiectasis, uncomplicated: Secondary | ICD-10-CM

## 2018-08-05 DIAGNOSIS — Z8679 Personal history of other diseases of the circulatory system: Secondary | ICD-10-CM

## 2018-08-05 DIAGNOSIS — L03116 Cellulitis of left lower limb: Secondary | ICD-10-CM

## 2018-08-05 DIAGNOSIS — Z8614 Personal history of Methicillin resistant Staphylococcus aureus infection: Secondary | ICD-10-CM

## 2018-08-05 DIAGNOSIS — Z9981 Dependence on supplemental oxygen: Secondary | ICD-10-CM

## 2018-08-05 DIAGNOSIS — I2510 Atherosclerotic heart disease of native coronary artery without angina pectoris: Secondary | ICD-10-CM

## 2018-08-05 DIAGNOSIS — J189 Pneumonia, unspecified organism: Secondary | ICD-10-CM

## 2018-08-05 LAB — BASIC METABOLIC PANEL
Lab: 1.1 mg/dL (ref 0.4–1.24)
Lab: 10 mg/dL (ref 8.5–10.6)
Lab: 102 MMOL/L (ref 98–110)
Lab: 11 (ref 3–12)
Lab: 137 MMOL/L (ref 137–147)
Lab: 24 MMOL/L (ref 21–30)
Lab: 39 mg/dL — ABNORMAL HIGH (ref 7–25)
Lab: 4.6 MMOL/L (ref 3.5–5.1)
Lab: 88 mg/dL (ref 70–100)
Lab: >60 mL/min (ref >60)
Lab: >60 mL/min (ref >60)

## 2018-08-05 MED ORDER — POTASSIUM CHLORIDE 10 MEQ PO CPER
10 meq | ORAL_CAPSULE | Freq: Every day | ORAL | 3 refills | 30.00000 days | Status: DC
Start: 2018-08-05 — End: 2018-12-06

## 2018-08-05 MED ORDER — RYBELSUS 3 MG PO TAB
1 | ORAL_TABLET | Freq: Every day | ORAL | 0 refills | Status: DC
Start: 2018-08-05 — End: 2018-08-05

## 2018-08-05 MED ORDER — BUMETANIDE 2 MG PO TAB
2 mg | ORAL_TABLET | Freq: Every day | ORAL | 3 refills | Status: DC
Start: 2018-08-05 — End: 2019-05-16

## 2018-08-05 MED ORDER — SEMAGLUTIDE 0.25 MG OR 0.5 MG(2 MG/1.5 ML) SC PNIJ
SUBCUTANEOUS | 1 refills | Status: DC
Start: 2018-08-05 — End: 2018-08-05

## 2018-08-05 NOTE — Progress Notes
Daily CardioMEMS Readings    Goal PA Diastolic: 78-24 mmHg   PA Diastolic Thresholds: 2-35 mmHg      Taken on PA Systolic PA Diastolic PA Mean Heart Rate   08-05-2018, 08:11 AM 31 mmHg 11 mmHg 17 mmHg 101 bpm   08-04-2018, 03:49 PM 33 mmHg 11 mmHg 17 mmHg 93 bpm   08-02-2018, 09:25 AM 25 mmHg 7 mmHg 12 mmHg 91 bpm   07-31-2018, 03:48 PM 24 mmHg 10 mmHg 14 mmHg 115 bpm   07-29-2018, 04:48 PM 25 mmHg 10 mmHg 15 mmHg 112 bpm   07-26-2018, 06:02 PM 29 mmHg 12 mmHg 18 mmHg 107 bpm   07-22-2018, 11:25 AM 23 mmHg 7 mmHg 12 mmHg 101 bpm   07-20-2018, 04:14 PM 26 mmHg 8 mmHg 14 mmHg 106 bpm   07-19-2018, 01:28 PM 27 mmHg 9 mmHg 15 mmHg 103 bpm   07-13-2018, 04:30 PM 31 mmHg 10 mmHg 17 mmHg 102 bpm   07-12-2018, 01:59 PM 29 mmHg 10 mmHg 17 mmHg 104 bpm   07-11-2018, 02:19 PM 20 mmHg 8 mmHg 12 mmHg 112 bpm   07-10-2018, 01:31 PM 26 mmHg 10 mmHg 15 mmHg 106 bpm   07-08-2018, 09:58 AM 26 mmHg 9 mmHg 15 mmHg 105 bpm   07-07-2018, 12:39 PM 29 mmHg 10 mmHg 16 mmHg 105 bpm   07-06-2018, 04:31 PM 35 mmHg 10 mmHg 19 mmHg 110 bpm   07-05-2018, 01:45 PM 25 mmHg 9 mmHg 14 mmHg 110 bpm   07-02-2018, 04:38 PM 31 mmHg 11 mmHg 17 mmHg 95 bpm   07-01-2018, 12:03 PM 25 mmHg 8 mmHg 14 mmHg 105 bpm   06-27-2018, 02:54 PM 21 mmHg 8 mmHg 12 mmHg 105 bpm   06-25-2018, 10:54 AM 35 mmHg 14 mmHg 22 mmHg 102 bpm   06-23-2018, 10:12 AM 32 mmHg 12 mmHg 19 mmHg 105 bpm   06-21-2018, 01:44 PM 30 mmHg 10 mmHg 16 mmHg 112 bpm   06-19-2018, 02:27 PM 29 mmHg 10 mmHg 16 mmHg 95 bpm

## 2018-08-05 NOTE — Progress Notes
Date of Service: 08/05/2018    Ruben Wood. Ruben Wood is a 78 y.o. male.       HPI    I had opportunity of seeing Ruben Wood at [facility] [facility] for advanced heart failure and transplantation.  A very pleasant 78 year old gentleman who usually follows with [name].  He saw Dr. Kathreen Cosier and 1 of our nurse practitioners [name] on 03/18/2018.He has a medical history of chronic heart failure with reduced ejection fraction which has recovered to an EF of 45%, CAD with drug-eluting stent, aneurysm of abdominal aorta, stage III CKD, hyperlipidemia, COPD with chronic bronchiectasis, hypertension, OSA, venous stasis and hypogammaglobulinemia. ???He also has a CardioMEMS device his goal is PAD 10-12 and threshold 6-14.His GDMT is limited due to orthostatic hypotension. ???For this reason Toprol-XL and losartan have been removed from his medications. ???He also takes Midodrine???5 mg twice daily. ???His symptoms of orthostatic hypotension today are???stable. ???Due to the symptoms his free light chains and serum electrophoresis pheresis were checked at the previous visit and showed normal kappa lambda ratio of 0.96. ???His kappa was 3.5 and his lambda was 3.64. ???His alpha proteins and his serum electrophoresis were elevated. ???Additionally a PYP scan was completed on 1/8 and negative for ATTR cardiac amyloidosis.  ???  He was hospitalized  on 1/8???1/13 for???pneumonia and???acute heart failure exacerbation. ???He was treated with IV Bumex for 3 days. ???He was sent home on Bumex 3 mg a.m./2 mg p.m. ???He has had chronic RLE greater than LLE and both cellulitis and DVT were ruled out this hospitalization.  ???  He reports feeling okay overall but feels???like his diuretic is not getting enough fluid off. ???He denies heart palpitations, chest pain,. ???He sleeps in the recliner and uses his CPAP machine. ???Occasionally wakes up short of breath still. ???He reports lower extremity edema, abdominal distention, cough. ???He saw his pulmonologist recently and is working on the above-mentioned interventions. ???He also takes Mucinex daily which he plans to increase to twice daily.  I took a look at his labs and his creatinine has been elevated on labs done on 01/26/2019.  His creatinine has bumped to 1.44 from 0.95.  I obtain point-of-care creatinine here also and it was 1.5-1.6.    Vitals:    08/05/18 1158   Pulse: 100   SpO2: 95%   Weight: 136.1 kg (300 lb)   Height: 1.803 m (5' 11)   PainSc: Zero     Body mass index is 41.84 kg/m???.     Past Medical History  Patient Active Problem List    Diagnosis Date Noted   ??? Complex care coordination 05/04/2017     Priority: High     Class: Acute   ??? Moderate episode of recurrent major depressive disorder (HCC) 03/12/2018   ??? Type 2 diabetes mellitus with hyperglycemia, without long-term current use of insulin (HCC) 03/12/2018   ??? Atrial fibrillation (HCC) 03/12/2018   ??? Orthostatic hypotension 12/28/2017   ??? Morbid obesity (HCC) 11/16/2017   ??? Chronic respiratory failure with hypercapnia (HCC) 11/16/2017   ??? Concussion with loss of consciousness of 30 minutes or less 11/16/2017   ??? Acute pain due to trauma 11/15/2017   ??? Episode of syncope 11/15/2017   ??? Facial laceration 11/15/2017   ??? Depression 11/15/2017   ??? Closed traumatic dislocation of right shoulder 11/15/2017   ??? Impaired mobility 11/15/2017   ??? Trauma 11/14/2017   ??? OAB (overactive bladder)      Urinary frequency,  urgency, urge urinary incontinence (UUI), nocturia, nocturnal enuresis.  -- trial Mirabegron 25 mg --> improved, but persistent sx's.  -- trial Mirabegron 50 mg.     ??? Urinary incontinence, urge      See OAB A&P note.     ??? Urinary urgency      See OAB A&P note.     ??? Nocturia      See OAB A&P note.     ??? Nocturnal enuresis    ??? Constipation    ??? Swelling of left hand 10/07/2017     Resolved, unclear etiology.     ??? Chronic combined systolic and diastolic CHF (congestive heart failure) (HCC) 07/13/2017 ??? Upper extremity pain, anterior, right 07/03/2017   ??? Right hand pain 07/03/2017   ??? Coronary artery disease due to lipid rich plaque 06/29/2017   ??? Oropharyngeal dysphagia 05/10/2017   ??? Bronchiectasis with acute lower respiratory infection (HCC) 05/10/2017   ??? Acute on chronic systolic congestive heart failure (HCC) 05/02/2017   ??? Acute on chronic systolic and diastolic heart failure, NYHA class 3 (HCC) 03/27/2017   ??? Urinary frequency 03/08/2017   ??? 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     ??? 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  D/c'd Tamsulosin d/t lack of symptom improvement.     ??? Enrolled in chronic care management 02/28/2017   ??? Elevated prostate specific antigen (PSA)      PNBx (03/01/2017): (L) high-grade prostatic intraepithelial neoplasia (HG PIN), 1/6 cores; [name], PA-C.     ??? Spondylolisthesis, lumbar region 12/13/2016   ??? Lactic acid acidosis 11/07/2016   ??? Hyperglycemia 11/07/2016   ??? Lumbar post-laminectomy syndrome      L4-5     ??? Iron deficiency 10/09/2016   ??? Spinal stenosis of lumbosacral region 09/20/2016   ??? Spondylolisthesis of lumbosacral region 09/20/2016   ??? Lumbar radiculopathy 09/20/2016   ??? Osteoarthritis of spine with radiculopathy, lumbosacral region 09/20/2016   ??? Venous ulcer of left leg (HCC) 09/13/2016   ??? 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 Lower extremity edema 07/13/2016   ??? 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 He had a normal response to pneumococca vaccination; he had 17/23 positive serotypes. He has normal tetanus toxoid Ab and CH50.  His T&B cell panel revealed a mildly low B-cell count at 68 right after a time of acute illness.  He had positive diphtheria antibody.    Likely multifactorial with significant comorbidities including frequent steroid use for bronchiectasis exacerbations, frequent infections, and possibly a nutritional component (low total protein).     Ref. Range 02/25/2016 09/14/2016 02/28/2017 04/19/2017 05/05/2017  09/11/17   IgG 762 - 1,488 MG/DL 161 (L) 096 (L) 045 (L) 472 (L) 492 (L) 737     I suspect his low levels are due to acute illness as they are often checked when he is actively ill, or has had a lot of steroids. IgA and IgM are within normal limits.    Over the past 2 years, he is continued to get ill within a week or 2 of discontinuing antibiotic therapy.  He is not an ideal candidate for immunoglobulin replacement therapy (  risk factors include blood clots and aseptic meningitis, among others). Prophylactic antimicrobial management with Doxycycline has been beneficial for him over past 3 months. He had 1 episode of pneumonia requiring higher-strength Levofloxacin; hospitalization subsequent to this infection was for CHF and fluid overload.     Plan:  -Continue doxycycline 100 mg daily  - Recommend getting the pneumovax immunization next time he is in clinic in person and will need Repeat pneumococcal antibody levels 1 month afterwards.          ??? Moderate persistent asthma without complication 02/25/2016     He has had asthma since sometime in his 20-30s. He gets cough, chest tightness, wheezing, shortness of breath that is year round with worsening in the Spring and Fall.   Triggers: URI, hot/cold air. Additionally he has been diagnosed with bronchiectasis and emphysema; his PFTs suggest both obstructive and restrictive disease and asthma is unlikely to be the sole cause of his dyspnea.    He has negative IgE immunocaps to aeroallergens which makes the possibility of allergic asthma highly unlikely. His IgE level was 17 and he had negative ANCAs, MPO and PR3. Repeatedly being treated with steroids now for flares.  Also on hypertonic saline 4 times a day, duo nebs, Symbicort, and Singulair as well as aggressive pulmonary clearance.    - Continue use of Aerobika  - Start use of EMST 150 Expiratory Muscle Strength Trainer  - Continue following with Dr. [name] for continued management.     ??? 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 He is not having issues with urticaria.    - We recommend IgE immunocaps to aeroallergens.      ??? Chronic Rhinoconjunctivitis 02/25/2016     Perennial, since childhood.  Nasal sprays are not helpful (Flonase, Nasacort) and patient had incorrect technique with sprays with resultant nosebleeds.  AIT several times in his life, only the first round was helpful.  02/2016 - CT sinuses normal.  02/2016 - Aeroallergen IgE negative.    Currently on Zyrtec 10mg  daily, flunisolide, and Singulair 10mg  daily.     - Continue current management. He would not be a good candidate for further testing and allergy immunotherapy.  - pt feels his is stable and doing well on this regiment.      ??? 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 Etiologies include likely atopic disease, but other considerations include vasculitidies such as EGPA. Less likely parasitic infection as no bothersome GI symptoms or serpiginous rashes.    - We recommend repeating CBC w/ diff.   - We will obtain ANCAs     ??? Recurrent infections 02/25/2016     He has had 5-10 rounds of antibiotics per year for sinus infections and bronchitis. He was previously having recurrent pneumonias (3-4 times a year for 5 years). Additionally he has had cellulitis in LE requiring hospitalization and IV Abx. Although these infections could be explained by underlying disease processes (obstructive/restrictive lung diseases and venous stasis requiring vein stripping), we did an immune evaluation and found hypgammaglobulinemia (IgG) with normal protective vaccine responses. IgG returns to near normal when the patient is well and not on steroids.    > Since Dec of 2019 he has not had any more infections whether skin or lung related. Continue to be on suppressive doxycyline      ??? Postoperative visit 02/23/2016   ??? S/P left pulmonary artery pressure sensor implant placement (CardioMEMs)  02/02/2016     *  Patient has CardioMEMs (Pulmonary Artery Pressure Sensor)* Please call Matthew Folks, CardioMEMs Program Coordinator [phone] or page Heart Failure Rounding Team if patient is admitted or presents to ED*  03/30/15 implant     ??? Ischemic cardiomyopathy 01/12/2016   ??? 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 Wound of skin 01/12/2016   ??? 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 Varicose veins of left lower extremity with pain 01/05/2016   ??? 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 Hypercholesterolemia 11/25/2015   ??? 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 Bronchiectasis without complication (HCC) 06/18/2015     Non-sputum producer.  Currently on Symbicort.  - Dr. [name] was able to get him a vest to wear at home for secretions and this has helped immensely with his secretions and breathing.      ??? Abnormal cortisol level 12/25/2014   ??? Venous stasis dermatitis of both lower extremities 11/04/2014   ??? Sacroiliac dysfunction 07/28/2014   ??? 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 Chronic combined systolic and diastolic congestive heart failure, NYHA class 2 (HCC) 07/19/2014 11/04/14: EF 20%, severely dilated LV with grade 1 diastolic dysfunction.  11/12/2014 In clinic today, patient appears fairly well compensated.  He does have some LEE, but no significant rales (just some at bases), no JVD sitting upright, and no sx to suggest decompensation.  We will continue metoprolol, spironolactone, and bumex at current doses.    11/17/2014 Edema much improved in lower ext, Cr trending down on last labs.  Will recheck labs today.  Advised to weigh himself daily.  Rechecking BMP today to ensure not overdoing diuretics.     On metoprolol, spironolactone.  Will need to explore allergy to ARB more as patient may benefit from ACEI.      03/30/15 CardioMEMS implant by Dr. Chales Abrahams  1. Normal cardiac output and cardiac index.  2. Normal pulmonary pressures and normal pulmonary capillary wedge pressure.  3. Successful insertion of CardioMEMS pulmonary artery pressure sensor     ??? 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      ??? CKD (chronic kidney disease) stage 3, GFR 30-59 ml/min (HCC) 08/26/2013   ??? Mixed dyslipidemia 08/26/2013     Hepatic Function    Lab Results   Component Value Date/Time    ALBUMIN 4.0 11/04/2014 12:26 AM    TOTAL PROTEIN 6.9 11/04/2014 12:26 AM    ALK PHOSPHATASE 61 11/04/2014 12:26 AM    Lab Results   Component Value Date/Time    AST (SGOT) 41* 11/04/2014 12:26 AM    ALT (SGPT) 19 11/04/2014 12:26 AM    TOTAL BILIRUBIN 1.4* 11/04/2014 12:26 AM        Lab  Results   Component Value Date    CHOL 148 12/15/2013    TRIG 122 12/15/2013    HDL 51 12/15/2013    LDL 88 12/15/2013 VLDL 24 16/11/9602    NONHDLCHOL 97 12/15/2013       BP Readings from Last 3 Encounters:   11/12/14 129/80   11/09/14 111/44   11/04/14 146/65     Plan:   78 y.o. with known CAD with CTO of RCA and obtuse marginal branch with high grade stenosis (90%) in mid left circ.    Advised heart healthy diet and daily exercise.    Continue high intensity statin, atorvastatin       ??? AAA (abdominal aortic aneurysm) (HCC) 10/15/2012     Duplex done at OSH in 2007 and 2008 ~ 4.1 cm    Duplex 10/15/12-AAA 7.3 cm AP x 7.3 cm       ??? History of MRSA infection 10/14/2012   ??? Chronic cough 08/29/2012   ??? CAD (coronary artery disease), native coronary artery 08/15/2012     07/11/2002- Cath @ [facility] showed Severe double vessel disease(OMB of circumflex and distal circ)  inferior-basilar dyskinesis.                  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    09/12/12   Cath - [place]:  CTO of the right coronary artery, CTO of the obtuse marginal branch and high-grade stenosis of   90% in the mid left circumflex artery No significant stenosis in the left anterior descending artery or left main vessel     Failed attempt in opening 2nd obtuse marginal CTO as described above.    10/14/12: Unsuccessful attempt to open CTO of OMB and mid-CFX by Dr. Mackey Birchwood      04/02/17: Cath by Dr. Steward Ros:   4. Chronic total occlusion of the circumflex.  5. Chronic total occlusion of the right coronary artery, status post percutaneous coronary intervention with drug-eluting stent times 2.  6. Small distal right coronary artery perforation without echo or hemodynamic evidence of compromise.    06/29/17-cardiac catheterization by Dr. Steward Ros. One DES placed to chronic total occlusion of the circumflex artery. The RCA stent was patent.     ??? OSA on CPAP 08/13/2012 CPAP at  DME: [facility]    Split night:  08/14/2017  AHI 43.2  REM n/a  Time below 88 %: 98 mins  Lowest sat: 83%     CPAP 10 cm h20 effective      ??? GERD (gastroesophageal reflux disease) 08/13/2012     -PPI  -follows with GI     ??? 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  Discussed the importance of diet.  Exercise as tolerated.        ??? Essential hypertension 08/13/2012   ??? Chest wall deformity 07/09/2012     Chest wall reconstruction on 07/09/12  Lung herniation- bio bridge           Review of Systems   Constitution: Negative.   HENT: Negative.  Eyes: Negative.    Cardiovascular: Negative.    Respiratory: Negative.    Endocrine: Negative.    Hematologic/Lymphatic: Negative.    Skin: Negative.    Musculoskeletal: Negative.    Gastrointestinal: Negative.    Genitourinary: Negative.    Neurological: Positive for dizziness.   Psychiatric/Behavioral: Negative.    Allergic/Immunologic: Negative.        Physical Exam  General Appearance: In NAD,???obese,???  Neck Veins: ???JVP 7, neck veins???fill in, difficult to assess  Chest Inspection: chest is normal in appearance   Respiratory Effort:accessory muscle use intermittently after coughing fits,???breathing comfortably, no respiratory distress   Auscultation/Percussion: lungs clear to auscultation, no rales, rhonchi or wheezing   Cardiac Rhythm: regular rhythm and normal rate   Cardiac Auscultation: S1, S2 normal, no rub, no definite S3 ???or S4   Murmurs: no murmur   Peripheral Circulation: normal peripheral circulation   Pedal Pulses: normal symmetric pedal pulses   Lower Extremity Edema:???Chronic venous stasis changes, LLE>???RLE 1+ pitting edema LLE non-pitting edema RLE Abdominal Exam:???Taut, non-tender, no obvious masses, bowel sounds normal   Gait &???Station: walks without assistance   Orientation: oriented to person, place and time   Affect &???Mood: appropriate and sustained affect   Language and Memory: patient responsive and seems to comprehend information   Neurologic Exam: neurological assessment grossly intact     Cardiovascular Studies      Problems Addressed Today  Encounter Diagnoses   Name Primary?   ??? Chronic combined systolic and diastolic congestive heart failure, NYHA class 2 (HCC) Yes       Assessment and Plan    1.  Heart failure with reduced ejection fraction.  EF now is around 45%.  NYHA class IV stage C heart failure.  NYHA class IV could be secondary to his obesity and pulmonary disease also.  He is at optimal pulmonary artery diastolic pressures from his CardioMEMS.  His threshold is 6-12 and he has been around 11 for last few days.  Creatinine is slightly elevated.  He may have some fluid also on board.    He has gained a lot of weight since I saw him last time and this appears to be purely caloric weight.  He admits eating more.  This is seriously affecting his overall health and worsening his diastolic heart failure.  ???    1.  Recommendations:    Start Bumex 2mg  by mouth once daily.Take additional 2mg  if you have swelling in feet.  - Start potassium SR  by mouth daily.  - BMP lab in a week.  - Physical therapy. We will send order to [facility].  We will start him on Ozempic for his obesity and diabetes.  We will see if that helps his weight.      40 minutes of time spent with patient and family.  Greater than 30 minutes of this time spent counseling regarding heart failure with reduced ejection fraction, medications and volume control.  Vanetta Shawl M.D  Advance Heart Failure and Transplant Cardiologist  ???  Current Medications (including today's revisions)  ??? acetaminophen (TYLENOL) 325 mg tablet Take two tablets by mouth every 6 hours as needed for Pain.   ??? albuterol 0.083% (PROVENTIL; VENTOLIN) 2.5 mg /3 mL (0.083 %) nebulizer solution Inhale 3 mL solution by nebulizer as directed every 6 hours as needed for Wheezing or Shortness of Breath. Indications: asthma   ??? albuterol sulfate (PROAIR HFA) 90 mcg/actuation aerosol inhaler Inhale two puffs by mouth into the  lungs every 4 hours as needed for Wheezing or Shortness of Breath.   ??? allopurinol (ZYLOPRIM) 300 mg tablet Take one tablet by mouth daily. Take with food.   ??? AMITR/GABAPEN/EMU OIL 05/24/08% CREAM (COMPOUND) Apply 5 grams (10 pumps) topically to affected area three times daily.   ??? Ammonium Lactate-Emu Oil (EMU-LAC) 10 % crea Apply  topically to affected area. Apply to feet    ??? ascorbic acid (VITAMIN C) 500 mg tablet Take one tablet by mouth daily.   ??? aspirin EC 81 mg tablet Take one tablet by mouth daily. Take with food.   ??? atorvastatin (LIPITOR) 40 mg tablet TAKE (1) TABLET BY MOUTH ONCE DAILY   ??? azithromycin (ZITHROMAX) 250 mg tablet Z-PAK: Take 2 tabs by mouth on day 1, followed by 1 tab by mouth daily on days 2-5.   ??? benzonatate (TESSALON PERLES) 100 mg capsule Take two capsules by mouth every 8 hours as needed for Cough.   ??? budesonide/formoterol (SYMBICORT HFA) 160/4.5 mcg inhalation Inhale two puffs by mouth into the lungs twice daily. Rinse mouth after use.   ??? bumetanide (BUMEX) 2 mg tablet Take one tablet by mouth twice daily. Start on 04/12/2018   ??? cholecalciferol (VITAMIN D-3) 1,000 units tablet Take 1,000 Units by mouth daily.   ??? clopiDOGrel (PLAVIX) 75 mg tablet Take one tablet by mouth daily.   ??? docusate (COLACE) 100 mg capsule Take 100 mg by mouth daily as needed for Constipation.   ??? doxycycline (MONODOX) 100 mg capsule Take one capsule by mouth daily.   ??? doxycycline (VIBRAMYCIN) 100 mg capsule Take 100 mg by mouth daily. For prophylaxis   ??? duloxetine DR (CYMBALTA) 60 mg capsule Take one capsule by mouth daily. ??? finasteride (PROSCAR) 5 mg tablet Take one tablet by mouth daily.   ??? fish oil- omega 3-DHA/EPA 300/1,000 mg capsule Take 1 capsule by mouth daily.   ??? gabapentin (NEURONTIN) 400 mg capsule Take one capsule by mouth every 8 hours for 90 days.   ??? HYDROcodone/acetaminophen (NORCO) 5/325 mg tablet Take one tablet by mouth every 4 hours as needed for Pain   ??? ipratropium bromide (ATROVENT) 42 mcg (0.06 %) nasal spray 1 spray to each nostril Q6H PRN   ??? midodrine (PROAMATINE) 5 mg tablet Take one tablet by mouth twice daily. Take AM dose first thing in AM, and second dose at 2-3 PM.   ??? milk of magnesia (CONC) 2,400 mg/10 mL oral suspension Take  by mouth daily as needed for Heartburn.   ??? montelukast (SINGULAIR) 10 mg tablet Take one tablet by mouth at bedtime daily.   ??? nitroglycerin (NITROSTAT) 0.4 mg tablet Place 0.4 mg under tongue every 5 minutes as needed for Chest Pain. Max of 3 tablets, call 911.   ??? pantoprazole DR (PROTONIX) 40 mg tablet TAKE (1) TABLET BY MOUTH TWO TIMES A DAY.   ??? polyethylene glycol 3350 (MIRALAX) 17 g packet Take one packet by mouth as Needed.   ??? potassium chloride SR (K-DUR) 20 mEq tablet Take one tablet by mouth daily. Take with a meal and a full glass of water.   ??? sodium chloride 3 % nebulizer solution Inhale 4 mL by mouth into the lungs twice daily. Use with [place].  Indications: asthma, chronic cough, bronchiectasis   ??? spironolactone (ALDACTONE) 25 mg tablet Take one tablet by mouth twice daily. Take with food.   ??? tamsulosin (FLOMAX) 0.4 mg capsule Take 1 capsule by mouth daily.   ???  traMADol (ULTRAM) 50 mg tablet Take one tablet by mouth every 8 hours as needed for Pain.   ??? zinc oxide 20 % topical ointment Place twice a day on the buttock wound

## 2018-08-06 ENCOUNTER — Encounter: Admit: 2018-08-06 | Discharge: 2018-08-06

## 2018-08-06 DIAGNOSIS — R739 Hyperglycemia, unspecified: Secondary | ICD-10-CM

## 2018-08-06 DIAGNOSIS — I5043 Acute on chronic combined systolic (congestive) and diastolic (congestive) heart failure: Secondary | ICD-10-CM

## 2018-08-06 MED ORDER — METFORMIN 500 MG PO TB24
ORAL_TABLET | Freq: Every day | 0 refills | Status: DC
Start: 2018-08-06 — End: 2018-08-06
  Filled 2018-08-06: qty 42, 28d supply

## 2018-08-06 MED ORDER — VICTOZA 2-PAK 0.6 MG/0.1 ML (18 MG/3 ML) SC PNIJ
Freq: Every day | 1 refills | Status: DC
Start: 2018-08-06 — End: 2018-08-06

## 2018-08-06 MED ORDER — SEMAGLUTIDE 0.25 MG OR 0.5 MG(2 MG/1.5 ML) SC PNIJ
SUBCUTANEOUS | 2 refills | Status: DC
Start: 2018-08-06 — End: 2018-10-01
  Filled 2018-08-06: qty 1, 42d supply, fill #1

## 2018-08-06 NOTE — Progress Notes
Heart Failure Clinic Pharmacist Note    Met with patient and spouse in clinic to discuss potentially starting GLP1 to promote weight loss and reduce risk of major cardiovascular events. Called patient and spouse 6/16 to discuss options. Patient has previously tried Victoza (liraglutide) and experienced GI side effects and it was therefore discontinued. Patient states he does not remember the side effects and would like to try the once weekly medication, Ozempic.     Discussed starting Ozempic 0.25 mg once weekly x4 weeks, then 0.5 mg weekly x4 weeks, with the goal to increase to 1 mg weekly thereafter. Could consider increasing dose to 2.4 mg weekly, based upon new clinical trail data from the STEP 4 trial.     Medication will be shipped from [facility] and patient is aware of price. E-mailed patient handout and reviewed injection technique, storage, and side effects. Will follow up with patient in ~2 weeks and provided patient with phone number for pharmacist if further questions arise.     Patient plans to get repeat BMP next week, will have patient also get A1C (last 04/03/17) and lipid panel (last 06/29/2017).     Time Spent: [place] min     Shearon Stalls, [place]

## 2018-08-06 NOTE — Telephone Encounter
Bartholomew Boards, MD  P Cvm Nurse Hf Team [place]            no change    Previous Messages      ----- Message -----   From: Ma Rings, RN   Sent: 08/05/2018  3:03 PM CDT   To: Bartholomew Boards, MD     Dr. Manuella Ghazi,     Labs from [place] today, 6/15.     Please advise,   Lili         Pt has been updated.

## 2018-08-06 NOTE — Progress Notes
From: Bartholomew Boards, MD   Sent: 08/06/2018  1:26 PM CDT   To: Shearon Stalls, PHARMD, Cvm Nurse Hf Team [place]   Subject: RE: Ozempic start                   Lets start it! Order A1C, FLP and BMP NEXT WEEK. How much is the co-pay?   ----- Message -----   From: Shearon Stalls, [place]   Sent: 08/06/2018  1:21 PM CDT   To: Bartholomew Boards, MD   Subject: Ozempic start                     Hi Dr. Manuella Ghazi-     Doristine Devoid news, [name] is going to start [name]! Can we order a A1C and lipid panel for him to get along with his BMP next week? I'll follow up with him regarding these.     Thanks!   Clarise Cruz

## 2018-08-07 ENCOUNTER — Encounter: Admit: 2018-08-07 | Discharge: 2018-08-07

## 2018-08-07 DIAGNOSIS — I5042 Chronic combined systolic (congestive) and diastolic (congestive) heart failure: Secondary | ICD-10-CM

## 2018-08-10 ENCOUNTER — Encounter: Admit: 2018-08-10 | Discharge: 2018-08-10

## 2018-08-11 ENCOUNTER — Encounter: Admit: 2018-08-11 | Discharge: 2018-08-11

## 2018-08-12 ENCOUNTER — Encounter: Admit: 2018-08-12 | Discharge: 2018-08-12

## 2018-08-12 DIAGNOSIS — I5043 Acute on chronic combined systolic (congestive) and diastolic (congestive) heart failure: Secondary | ICD-10-CM

## 2018-08-12 DIAGNOSIS — R739 Hyperglycemia, unspecified: Secondary | ICD-10-CM

## 2018-08-12 DIAGNOSIS — I5042 Chronic combined systolic (congestive) and diastolic (congestive) heart failure: Secondary | ICD-10-CM

## 2018-08-12 LAB — LIPID PROFILE
Lab: 17
Lab: 187 — ABNORMAL HIGH (ref 30–160)
Lab: 2.4
Lab: 38 — ABNORMAL LOW (ref 40–60)
Lab: 93

## 2018-08-12 LAB — BASIC METABOLIC PANEL
Lab: 1.4 — ABNORMAL HIGH (ref 0.8–1.3)
Lab: 134 — ABNORMAL LOW (ref 137–150)
Lab: 135 — ABNORMAL HIGH (ref 65–110)
Lab: 23
Lab: 26
Lab: 33 — ABNORMAL HIGH (ref 7.0–18.0)
Lab: 5
Lab: 52
Lab: 9.5
Lab: 99

## 2018-08-12 LAB — HEMOGLOBIN A1C: Lab: 6.7 — ABNORMAL HIGH (ref 4.5–6.2)

## 2018-08-12 NOTE — Telephone Encounter
Call pt advised no change in medication labs in 1 week.  Pt veb understanding. Requests lab order to be faxed to [facility].

## 2018-08-12 NOTE — Telephone Encounter
-----   Message from Bartholomew Boards, MD sent at 08/12/2018  4:37 PM CDT -----  Continue Bumex at current dose. BMP in a week  ----- Message -----  From: Raynelle Jan, BSN  Sent: 08/12/2018   3:11 PM CDT  To: Bartholomew Boards, MD    BMP Creat elevated, BUN elevated remains in line, trig elevated    Last OV 08/05/2018  Next OV not sched yet    AVS:   Recommendations:    Start Bumex 2mg  by mouth once daily.Take additional 2mg  if you have swelling in feet.  - Start potassium SR 75meq  by mouth daily.  - BMP lab in a week.  - Physical therapy. We will send order to [facility].    MEDS:  K+ SR 10 meq q day  -Bumex 2 mg q day, if dependent edema additional 2 mg  -ASA 81 mg q day  -Atorvastatin 40 mg q day  -Midodrine 5mg  BID  -Nitrostat 0.4 sl prn  -Semaglutide 0.25mg  inj q 7 days x28 days then 0.5 mg  inj  q 7 days

## 2018-08-13 ENCOUNTER — Encounter: Admit: 2018-08-13 | Discharge: 2018-08-13

## 2018-08-19 ENCOUNTER — Encounter: Admit: 2018-08-19 | Discharge: 2018-08-19

## 2018-08-19 NOTE — Progress Notes
Attempted to reach patient for routine CM follow up.   General message left with CM call back information.     Will attempt next outreach in approximately 1 month.

## 2018-08-22 ENCOUNTER — Encounter: Admit: 2018-08-22 | Discharge: 2018-08-22

## 2018-08-22 ENCOUNTER — Ambulatory Visit: Admit: 2018-08-22 | Discharge: 2018-08-22

## 2018-08-22 DIAGNOSIS — I5042 Chronic combined systolic (congestive) and diastolic (congestive) heart failure: Secondary | ICD-10-CM

## 2018-08-27 ENCOUNTER — Encounter: Admit: 2018-08-27 | Discharge: 2018-08-27

## 2018-08-27 NOTE — Progress Notes
Heart Failure Clinic Pharmacist Note    Called patient to follow up on Ozempic initiation. Spouse states patient is doing very well and denies any side effects. Confirms patient has had 3 doses of 0.25 mg dose. Reviewed dosing schedule; patient has 1 more dose of 0.25 mg, then will increase to 0.5 mg  Patient will be due for refill for 7/27 dose; will follow up with patient the week of 7/20 for refill.     Repeat BMP was previously requested by Dr. Manuella Ghazi (see encounter from 6/22), however, patient went to lab but there was no order. Patient requests we re-send BMP to [facility] (not [facility]). Patient plans to go on Thursday, 7/9. Will route to nursing team for this.     Shearon Stalls, [place]  [phone]

## 2018-08-29 ENCOUNTER — Encounter: Admit: 2018-08-29 | Discharge: 2018-08-29

## 2018-08-29 DIAGNOSIS — I5042 Chronic combined systolic (congestive) and diastolic (congestive) heart failure: Secondary | ICD-10-CM

## 2018-08-29 LAB — BASIC METABOLIC PANEL
Lab: 1.6 — ABNORMAL HIGH (ref 0.8–1.3)
Lab: 103
Lab: 124 — ABNORMAL HIGH (ref 65–110)
Lab: 137
Lab: 23
Lab: 25
Lab: 37 — ABNORMAL HIGH (ref 7.0–18.0)
Lab: 44
Lab: 45
Lab: 9.2

## 2018-08-29 MED ORDER — ALLOPURINOL 300 MG PO TAB
300 mg | ORAL_TABLET | Freq: Every day | ORAL | 1 refills | 60.00000 days | Status: DC
Start: 2018-08-29 — End: 2019-04-04
  Filled 2018-08-30: qty 90, 90d supply, fill #1

## 2018-08-29 NOTE — Telephone Encounter
Pharmacy requesting: allopurinol  LR: 02/12/2018  LOV: 04/11/2018  This is not a standing order. Routing to Dr. Gibson Ramp to approve.   Myrtie Hawk, RN

## 2018-08-30 ENCOUNTER — Encounter: Admit: 2018-08-30 | Discharge: 2018-08-30

## 2018-09-02 MED ORDER — CLOPIDOGREL 75 MG PO TAB
ORAL_TABLET | Freq: Every day | ORAL | 0 refills | 90.00000 days | Status: DC
Start: 2018-09-02 — End: 2018-09-11

## 2018-09-05 ENCOUNTER — Encounter: Admit: 2018-09-05 | Discharge: 2018-09-05

## 2018-09-10 ENCOUNTER — Ambulatory Visit: Admit: 2018-09-10 | Discharge: 2018-09-10

## 2018-09-10 ENCOUNTER — Encounter: Admit: 2018-09-10 | Discharge: 2018-09-10

## 2018-09-10 DIAGNOSIS — I255 Ischemic cardiomyopathy: Secondary | ICD-10-CM

## 2018-09-10 DIAGNOSIS — I5043 Acute on chronic combined systolic (congestive) and diastolic (congestive) heart failure: Secondary | ICD-10-CM

## 2018-09-10 DIAGNOSIS — Z95810 Presence of automatic (implantable) cardiac defibrillator: Secondary | ICD-10-CM

## 2018-09-10 NOTE — Telephone Encounter
Patient's wife, Danton Clap, left VM stating patient has had a deep cough for the past few days and today it seems deeper. States she has called Cardio to "see what his numbers might be doing" to see if the fluid being built up or if it is just congestive. Asking what Dr. Gibson Ramp might think.  Spoke with Danton Clap, denies any other symptoms or possible COVID exposure. States that she spoke with cardio and his numbers weren't high, today's have not been reported. Suggested an appointment with Seth Bake tomorrow morning, [name] refused unless Dr. Gibson Ramp recommends, she wants his input first. Routing to PCP to advise.    Marvis Repress, RN

## 2018-09-10 NOTE — Telephone Encounter
Lets do a telehealth with me tomorrow, Wed, at 11:20am.  I can then get a better sense of what the next steps will be. Thanks ,.

## 2018-09-10 NOTE — Telephone Encounter
Appointment scheduled, patient and wife aware. Further needs or questions denied at this time.    Marvis Repress, RN

## 2018-09-11 ENCOUNTER — Encounter: Admit: 2018-09-11 | Discharge: 2018-09-11

## 2018-09-11 DIAGNOSIS — I509 Heart failure, unspecified: Secondary | ICD-10-CM

## 2018-09-11 DIAGNOSIS — R05 Cough: Secondary | ICD-10-CM

## 2018-09-11 DIAGNOSIS — I2510 Atherosclerotic heart disease of native coronary artery without angina pectoris: Secondary | ICD-10-CM

## 2018-09-11 DIAGNOSIS — G4733 Obstructive sleep apnea (adult) (pediatric): Secondary | ICD-10-CM

## 2018-09-11 DIAGNOSIS — I714 Abdominal aortic aneurysm, without rupture: Secondary | ICD-10-CM

## 2018-09-11 DIAGNOSIS — Z9981 Dependence on supplemental oxygen: Secondary | ICD-10-CM

## 2018-09-11 DIAGNOSIS — K219 Gastro-esophageal reflux disease without esophagitis: Secondary | ICD-10-CM

## 2018-09-11 DIAGNOSIS — I5042 Chronic combined systolic (congestive) and diastolic (congestive) heart failure: Secondary | ICD-10-CM

## 2018-09-11 DIAGNOSIS — E782 Mixed hyperlipidemia: Secondary | ICD-10-CM

## 2018-09-11 DIAGNOSIS — J449 Chronic obstructive pulmonary disease, unspecified: Secondary | ICD-10-CM

## 2018-09-11 DIAGNOSIS — R0600 Dyspnea, unspecified: Secondary | ICD-10-CM

## 2018-09-11 DIAGNOSIS — L03116 Cellulitis of left lower limb: Secondary | ICD-10-CM

## 2018-09-11 DIAGNOSIS — J4540 Moderate persistent asthma, uncomplicated: Secondary | ICD-10-CM

## 2018-09-11 DIAGNOSIS — I2582 Chronic total occlusion of coronary artery: Secondary | ICD-10-CM

## 2018-09-11 DIAGNOSIS — R609 Edema, unspecified: Secondary | ICD-10-CM

## 2018-09-11 DIAGNOSIS — M954 Acquired deformity of chest and rib: Secondary | ICD-10-CM

## 2018-09-11 DIAGNOSIS — N401 Enlarged prostate with lower urinary tract symptoms: Secondary | ICD-10-CM

## 2018-09-11 DIAGNOSIS — Z95828 Presence of other vascular implants and grafts: Secondary | ICD-10-CM

## 2018-09-11 DIAGNOSIS — I10 Essential (primary) hypertension: Secondary | ICD-10-CM

## 2018-09-11 DIAGNOSIS — J189 Pneumonia, unspecified organism: Secondary | ICD-10-CM

## 2018-09-11 DIAGNOSIS — N183 Chronic kidney disease, stage 3 (moderate): Secondary | ICD-10-CM

## 2018-09-11 DIAGNOSIS — Z8614 Personal history of Methicillin resistant Staphylococcus aureus infection: Secondary | ICD-10-CM

## 2018-09-11 DIAGNOSIS — J45909 Unspecified asthma, uncomplicated: Secondary | ICD-10-CM

## 2018-09-11 DIAGNOSIS — J479 Bronchiectasis, uncomplicated: Secondary | ICD-10-CM

## 2018-09-11 DIAGNOSIS — I429 Cardiomyopathy, unspecified: Secondary | ICD-10-CM

## 2018-09-11 DIAGNOSIS — Z8679 Personal history of other diseases of the circulatory system: Secondary | ICD-10-CM

## 2018-09-11 DIAGNOSIS — M961 Postlaminectomy syndrome, not elsewhere classified: Secondary | ICD-10-CM

## 2018-09-11 DIAGNOSIS — J302 Other seasonal allergic rhinitis: Secondary | ICD-10-CM

## 2018-09-11 MED ORDER — CLOPIDOGREL 75 MG PO TAB
75 mg | ORAL_TABLET | Freq: Every day | ORAL | 1 refills | Status: CN
Start: 2018-09-11 — End: ?

## 2018-09-11 MED ORDER — PREDNISONE 20 MG PO TAB
40 mg | ORAL_TABLET | Freq: Every day | ORAL | 0 refills | Status: AC
Start: 2018-09-11 — End: ?

## 2018-09-11 MED ORDER — AZITHROMYCIN 250 MG PO TAB
ORAL_TABLET | Freq: Every day | 0 refills | Status: DC
Start: 2018-09-11 — End: 2018-11-14

## 2018-09-11 NOTE — Progress Notes
History of Present Illness  Ruben Wood. is a 78 y.o. male with multiple medical problems including CAD, Systolic and Diastolic HF, HLD, OSA on CPAP, morbid obesity, chronic pain, and COPD who presents today with cough.     Obtained patient's verbal consent to treat them and their agreement to [facility] financial policy and NPP via this telehealth visit during the [facility] Emergency    He has developed increased cough from his baseline as well as increased renal production from his baseline.  He has had more chest tightness and wheezing.  He is using albuterol which has helped.  He denies any fevers or chills or chest pain.  He denies any sore throat.  He denies any COVID-19 exposure.  He does have a runny nose at baseline secondary to his allergies, but this is unchanged overall.  He denies any signs of significant heart failure such as increase or change in lower extremity edema.  He did have his CardioMEMS numbers ran yesterday and everything checked out okay.  He feels like this is very similar to his previous asthma attacks.    We talked at length about his coronary artery disease and the need for Plavix going forward.  He had a drug-eluting stent placed in May 2019.  He has completed 12 months of dual antiplatelet therapy.  He has not had any anginal type symptoms.  He is otherwise on optimal medical therapy.       Review of Systems   HENT: Positive for congestion and rhinorrhea. Negative for ear discharge, ear pain and sore throat.    Respiratory: Positive for cough and shortness of breath. Negative for chest tightness.    Cardiovascular: Negative for chest pain and leg swelling.   Gastrointestinal: Negative for diarrhea, nausea and vomiting.   Skin: Negative for wound.       Objective:         ??? acetaminophen (TYLENOL) 325 mg tablet Take two tablets by mouth every 6 hours as needed for Pain.   ??? albuterol 0.083% (PROVENTIL; VENTOLIN) 2.5 mg /3 mL (0.083 %) nebulizer solution Inhale 3 mL solution by nebulizer as directed every 6 hours as needed for Wheezing or Shortness of Breath. Indications: asthma   ??? albuterol sulfate (PROAIR HFA) 90 mcg/actuation aerosol inhaler Inhale two puffs by mouth into the lungs every 4 hours as needed for Wheezing or Shortness of Breath.   ??? allopurinoL (ZYLOPRIM) 300 mg tablet Take one tablet by mouth daily. Take with food.   ??? AMITR/GABAPEN/EMU OIL 05/24/08% CREAM (COMPOUND) Apply 5 grams (10 pumps) topically to affected area three times daily.   ??? Ammonium Lactate-Emu Oil (EMU-LAC) 10 % crea Apply  topically to affected area. Apply to feet    ??? ascorbic acid (VITAMIN C) 500 mg tablet Take one tablet by mouth daily.   ??? aspirin EC 81 mg tablet Take one tablet by mouth daily. Take with food.   ??? atorvastatin (LIPITOR) 40 mg tablet TAKE (1) TABLET BY MOUTH ONCE DAILY   ??? azithromycin (ZITHROMAX) 250 mg tablet Z-PAK: Take 2 tabs by mouth on day 1, followed by 1 tab by mouth daily on days 2-5.   ??? benzonatate (TESSALON PERLES) 100 mg capsule Take two capsules by mouth every 8 hours as needed for Cough.   ??? budesonide/formoterol (SYMBICORT HFA) 160/4.5 mcg inhalation Inhale two puffs by mouth into the lungs twice daily. Rinse mouth after use.   ??? bumetanide (BUMEX) 2 mg tablet Take one tablet  by mouth daily. Take extra dose for swelling   ??? cholecalciferol (VITAMIN D-3) 1,000 units tablet Take 1,000 Units by mouth daily.   ??? docusate (COLACE) 100 mg capsule Take 100 mg by mouth daily as needed for Constipation.   ??? doxycycline (MONODOX) 100 mg capsule Take one capsule by mouth daily.   ??? duloxetine DR (CYMBALTA) 60 mg capsule Take one capsule by mouth daily.   ??? finasteride (PROSCAR) 5 mg tablet Take one tablet by mouth daily.   ??? fish oil- omega 3-DHA/EPA 300/1,000 mg capsule Take 1 capsule by mouth daily.   ??? HYDROcodone/acetaminophen (NORCO) 5/325 mg tablet Take one tablet by mouth every 4 hours as needed for Pain ??? ipratropium bromide (ATROVENT) 42 mcg (0.06 %) nasal spray 1 spray to each nostril Q6H PRN   ??? midodrine (PROAMATINE) 5 mg tablet Take one tablet by mouth twice daily. Take AM dose first thing in AM, and second dose at 2-3 PM.   ??? milk of magnesia (CONC) 2,400 mg/10 mL oral suspension Take  by mouth daily as needed for Heartburn.   ??? montelukast (SINGULAIR) 10 mg tablet Take one tablet by mouth at bedtime daily.   ??? nitroglycerin (NITROSTAT) 0.4 mg tablet Place 0.4 mg under tongue every 5 minutes as needed for Chest Pain. Max of 3 tablets, call 911.   ??? pantoprazole DR (PROTONIX) 40 mg tablet TAKE (1) TABLET BY MOUTH TWO TIMES A DAY.   ??? polyethylene glycol 3350 (MIRALAX) 17 g packet Take one packet by mouth as Needed.   ??? potassium chloride (MICRO-K) 10 mEq capsule Take one capsule by mouth daily. Take with a meal and a full glass of water.   ??? predniSONE (DELTASONE) 20 mg tablet Take two tablets by mouth daily for 5 days.   ??? semaglutide (OZEMPIC) 0.25 mg or 0.5 mg(2 mg/1.5 mL) injection PEN Inject one-quarter mg under the skin every 7 days for 28 days, THEN one-half mg every 7 days for 28 days.   ??? sodium chloride 3 % nebulizer solution Inhale 4 mL by mouth into the lungs twice daily. Use with [place].  Indications: asthma, chronic cough, bronchiectasis   ??? spironolactone (ALDACTONE) 25 mg tablet Take one tablet by mouth twice daily. Take with food.   ??? tamsulosin (FLOMAX) 0.4 mg capsule Take 1 capsule by mouth daily.   ??? traMADol (ULTRAM) 50 mg tablet Take one tablet by mouth every 8 hours as needed for Pain.   ??? zinc oxide 20 % topical ointment Place twice a day on the buttock wound     There were no vitals filed for this visit.    Body mass index is 40.59 kg/m???.     Wt Readings from Last 5 Encounters:   09/11/18 132 kg (291 lb)   08/05/18 136.1 kg (300 lb)   07/23/18 136.1 kg (300 lb)   06/18/18 131.5 kg (290 lb)   06/05/18 127.9 kg (282 lb)       Physical Exam  HENT: Head: Normocephalic and atraumatic.   Pulmonary:      Effort: Pulmonary effort is normal.   Neurological:      General: No focal deficit present.      Mental Status: He is alert.   Psychiatric:         Mood and Affect: Mood normal.       Labs Reviewed:   Lab Results   Component Value Date/Time    HGBA1C 6.7 (H) 08/12/2018    HGBA1C 6.8 (H)  04/03/2017 04:39 AM    HGBA1C 5.4 02/25/2016 11:50 AM    HGBPOC 11.2 (L) 10/25/2012 10:58 AM    HGBPOC 12.9 (L) 10/25/2012 08:55 AM    TSH 2.160 03/01/2015 02:18 PM    FREET4R 0.99 12/15/2014 09:12 AM    CHOL 93 08/12/2018    TRIG 187 (H) 08/12/2018    HDL 38 (L) 08/12/2018    LDL 17.6 08/12/2018    NA 161 08/29/2018    K 44 08/29/2018    CL 103 08/29/2018    CO2 25.2 08/29/2018    GAP 11 08/05/2018 11:31 AM    BUN 37.0 (H) 08/29/2018    CR 1.6 (H) 08/29/2018    GLU 124 (H) 08/29/2018    CA 9.2 08/29/2018    PO4 2.7 11/19/2017 04:58 AM    ALBUMIN 3.2 (L) 03/04/2018 04:59 AM    TOTPROT 5.4 (L) 03/04/2018 04:59 AM    ALKPHOS 78 03/04/2018 04:59 AM    AST 13 03/04/2018 04:59 AM    ALT 12 03/04/2018 04:59 AM    TOTBILI 0.6 03/04/2018 04:59 AM    GFR 45 08/29/2018    GFRAA >60 08/05/2018 11:31 AM    PSA 11.60 (H) 04/23/2018 11:45 AM         Assessment and Plan:    1. Bronchiectasis with acute lower respiratory infection (HCC)    2. 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 Coronary artery disease involving native coronary artery without angina pectoris, unspecified whether native or transplanted heart    4. Moderate persistent asthma without complication    5. Chronic combined systolic and diastolic congestive heart failure, NYHA class 2 (HCC)      I think that his symptoms are best explained by an asthma and bronchiectasis flare, most likely an allergic trigger.  He does not have any other infectious symptoms and does not have any COVID-19 exposure.  He is not having any signs or symptoms suggestive of heart failure to explain his symptoms.  His CardioMEMS numbers have been stable.  I am treating him with prednisone burst as well as azithromycin.  He will let me know how his symptoms do going forward.    Also, I have reviewed with him his indications for clopidogrel long-term.  I do not see a compelling reason why needs to be on this long-term.  He has gone 12 months at least with the Plavix since his last drug-eluting stent was placed.  I want him to double check this with his cardiologist the next time he sees Dr. Jacqulyn Ducking.    RTC: 1 week with Sue Lush via telehealth for annual Medicare wellness visit    Patient Instructions     Future Appointments   Date Time Provider Department Center   09/11/2018 11:20 AM Khalid Lacko, [name], MD [facility] IM   09/23/2018 12:00 AM [facility] CVM Procedur   10/01/2018 10:30 AM Vanetta Shawl I, MD [facility] CVM Exam   10/24/2018 12:00 AM [facility] CVM Procedur   11/25/2018 12:00 AM [facility] CVM Procedur   12/02/2018 11:15 AM Raelyn Mora, PA-C [facility] Urology   12/26/2018 12:00 AM [facility] CVM Procedur   01/15/2019 11:30 AM CT[facility] Radiolog   01/15/2019  1:00 PM Harley Alto, MD [facility] CVM Exam   01/27/2019 12:00 AM [facility] CVM Procedur   02/27/2019 12:00 AM [facility] CVM Procedur   03/31/2019  12:00 AM [facility] CVM Procedur   05/01/2019 12:00 AM [facility] CVM Procedur   06/02/2019 12:00 AM [facility] CVM Procedur   07/03/2019 12:00 AM [facility] CVM Procedur   08/04/2019 12:00 AM [facility] CVM Procedur   09/04/2019 12:00 AM [facility] CVM Procedur   10/06/2019 12:00 AM [facility] CVM Procedur   11/06/2019 12:00 AM [facility] CVM Procedur 12/08/2019 12:00 AM [facility] CVM Procedur   01/08/2020 12:00 AM [facility] CVM Procedur   02/09/2020 12:00 AM [facility] CVM Procedur   03/11/2020 12:00 AM [facility] CVM Procedur   04/12/2020 12:00 AM [facility] CVM Procedur   05/13/2020 12:00 AM [facility] CVM Procedur   06/14/2020 12:00 AM [facility] CVM Procedur       If you are on Mychart, you will now be able to read your clinic note from me.  My hope is that this will improve your engagement and insight into your health care and improve the accuracy of your medical record.  If you find inaccuracies, I apologize.  Please bring any concerns or inaccuracies to your next appointment.  Please remember that medical language and documentation is very different from how you and I speak and our dictation software is not perfect.    General Instructions:  ??? How to reach me:   Please send a MyChart message to the General Medicine clinic or call Christine at [phone].    ??? How to get a medication refill:  Please use the MyChart Refill request or contact your pharmacy directly to request medication refills. Please allow 48 hours.     ??? How to receive your test results:  If you have signed up for MyChart, you will receive your test results and messages from me this way.  Otherwise, you will get a phone call or letter.   If you are expecting results and have not heard from my office within 2 weeks of your testing, please send a MyChart message or call my office.     ??? Scheduling:  Our Scheduling phone number is [phone].  Same Day appointments are usually available. Please ask for an annual or yearly physical appointment if it has been over 1 year since our last appointment.  ??? Appointment Reminders on your cell phone: Make sure we have your cell phone number, and Text [place] to [phone]. ??? Support groups for many chronic illnesses are available through [facility]: [facility] or [phone].             Return in about 1 week (around 09/18/2018) for Telehealth Appt, Schedule for Medicare Wellness, Schedule with Jackquline Denmark, ARNP.

## 2018-09-11 NOTE — Progress Notes
Did patient read financial policy, consent to treat, and notice of privacy practices? Yes    Does the patient give verbal consent to each policy? Yes    Does the patient have any vitals to report? Yes    Weight Vitals charted in O2? Yes    Is the patient in pain? 0 = No pain    Screening questions completed? Yes    Is the patient able to acces the Mychart message with the start visit link? Yes    Is patient in "virtual waiting room" Yes    Cough productive yesterday, today dry cough, shortness of breath, wheezing, no fever.  Symptoms started 2-3  days ago.  Plavix needs refilled asking if Dr. Gibson Ramp could do that or if they need to request through cardiology    Myrtie Hawk, RN

## 2018-09-11 NOTE — Patient Instructions
Future Appointments   Date Time Provider Department Center   09/11/2018 11:20 AM Ellsworth Waldschmidt, [name], MD [facility] IM   09/23/2018 12:00 AM [facility] CVM Procedur   10/01/2018 10:30 AM Vanetta Shawl I, MD [facility] CVM Exam   10/24/2018 12:00 AM [facility] CVM Procedur   11/25/2018 12:00 AM [facility] CVM Procedur   12/02/2018 11:15 AM Raelyn Mora, PA-C [facility] Urology   12/26/2018 12:00 AM [facility] CVM Procedur   01/15/2019 11:30 AM [facility] Radiolog   01/15/2019  1:00 PM Harley Alto, MD [facility] CVM Exam   01/27/2019 12:00 AM [facility] CVM Procedur   02/27/2019 12:00 AM [facility] CVM Procedur   03/31/2019 12:00 AM [facility] CVM Procedur   05/01/2019 12:00 AM [facility] CVM Procedur   06/02/2019 12:00 AM [facility] CVM Procedur   07/03/2019 12:00 AM [facility] CVM Procedur   08/04/2019 12:00 AM [facility] CVM Procedur   09/04/2019 12:00 AM [facility] CVM Procedur   10/06/2019 12:00 AM [facility] CVM Procedur   11/06/2019 12:00 AM [facility] CVM Procedur   12/08/2019 12:00 AM [facility] CVM Procedur   01/08/2020 12:00 AM [facility] CVM Procedur   02/09/2020 12:00 AM [facility] CVM Procedur   03/11/2020 12:00 AM [facility] CVM Procedur   04/12/2020 12:00 AM [facility] CVM Procedur   05/13/2020 12:00 AM [facility] CVM Procedur   06/14/2020 12:00 AM [facility] CVM Procedur       If you are on Mychart, you will now be able to read your clinic note from me.  My hope is that this will improve your engagement and insight into your health care and improve the accuracy of your medical record.  If you find inaccuracies, I apologize.  Please bring any concerns or inaccuracies to your next appointment.  Please remember that medical language and documentation is very different from how you and I speak and our dictation software is not perfect.    General Instructions:  ??? How to reach me:   Please send a MyChart message to the General Medicine clinic or call Christine at [phone].    ??? How to get a medication refill:  Please use the MyChart Refill request or contact your pharmacy directly to request medication refills. Please allow 48 hours.     ??? How to receive your test results:  If you have signed up for MyChart, you will receive your test results and messages from me this way.  Otherwise, you will get a phone call or letter.   If you are expecting results and have not heard from my office within 2 weeks of your testing, please send a MyChart message or call my office.     ??? Scheduling:  Our Scheduling phone number is [phone].  Same Day appointments are usually available. Please ask for an annual or yearly physical appointment if it has been over 1 year since our last appointment.  ??? Appointment Reminders on your cell phone: Make sure we have your cell phone number, and Text [place] to [phone].  ??? Support groups for many chronic illnesses are available through [facility]: [email or web address] or [phone].

## 2018-09-12 ENCOUNTER — Ambulatory Visit: Admit: 2018-09-11 | Discharge: 2018-09-12

## 2018-09-12 DIAGNOSIS — I5042 Chronic combined systolic (congestive) and diastolic (congestive) heart failure: Secondary | ICD-10-CM

## 2018-09-12 DIAGNOSIS — J470 Bronchiectasis with acute lower respiratory infection: Secondary | ICD-10-CM

## 2018-09-13 ENCOUNTER — Encounter: Admit: 2018-09-13 | Discharge: 2018-09-13

## 2018-09-13 MED FILL — SEMAGLUTIDE 0.25 MG OR 0.5 MG(2 MG/1.5 ML) SC PNIJ: 0.25 mg or 0.5 mg(2 mg/1.5 mL) | SUBCUTANEOUS | 28 days supply | Qty: 1 | Fill #2 | Status: AC

## 2018-09-16 ENCOUNTER — Encounter: Admit: 2018-09-16 | Discharge: 2018-09-16

## 2018-09-17 ENCOUNTER — Encounter: Admit: 2018-09-17 | Discharge: 2018-09-17

## 2018-09-17 DIAGNOSIS — R05 Cough: Secondary | ICD-10-CM

## 2018-09-17 NOTE — Progress Notes
Called patient's wife.   Notified of orders for CXR and CBC.   She states patient just fell asleep, finally.   She will take him early tomorrow morning for testing.   CM cancelled Medicare Wellness televisit for tomorrow, per her request, so she could take him for testing.     Orders faxed to [facility], with successful fax confirmation.     Notify wife by phone that they can walk in anytime from 8-5pm for testing.   She is appreciative of these efforts.   If condition worsens, she will seek ED treatment, but plans of doing testing tomorrow.

## 2018-09-17 NOTE — Progress Notes
Reached out to patient for routine CM follow up.   Chart reviewed.   Patient with recent visit for congestion and rhinorrhea.   States she completed his antibiotics and steroid burst.     Feels like he is not improved.   Rhinorrhea has resolved.   Continues to feel congested.  Audible wheezes noted.   Continues to cough, but rarely productive.   Denies fever, ear or sinus pain, or sick contacts.     Will route to PCP to advise and for next steps.

## 2018-09-18 ENCOUNTER — Encounter: Admit: 2018-09-18 | Discharge: 2018-09-18

## 2018-09-18 NOTE — Telephone Encounter
[facility] called stating they did not receive the fax for chest xray.  Requesting order to be sent to [phone].  I printed order and included CBC order as well and faxed this to the number and got confirmation of fax transmission.    I called back to let them know that it went through on our end and to confirm they had received this.    Myrtie Hawk, RN

## 2018-09-18 NOTE — Telephone Encounter
Patient's wife, Danton Clap called stating that last night patient's cough became very productive and was able to cough up a lot a "stuff". Patient states this morning that he feels better and does not feel he needs the chest xray and lab work. Wife wanted Dr. Real Cons opinion. This nurse called and spoke with wife and strongly recommended patient continue with lab and imaging as ordered due to patient's history. Wife agreed and states she will take him in early this afternoon to have lab/xray done.    Marvis Repress, RN

## 2018-09-20 ENCOUNTER — Encounter: Admit: 2018-09-20 | Discharge: 2018-09-20

## 2018-09-20 NOTE — Telephone Encounter
I called patient to provide lab and chest x-ray test results.  Patient verbalizes understanding.  I inquired on how patient is feeling.  He states he is feeling better and symptoms have improved.  Instructed to contact us if anything worsens and if after hours to call provider on call.    Patient verbalizes understanding.  Routing to Dr. Gibson Ramp and Farris Has, RN

## 2018-09-23 ENCOUNTER — Encounter: Admit: 2018-09-23 | Discharge: 2018-09-23

## 2018-09-23 ENCOUNTER — Ambulatory Visit: Admit: 2018-09-23 | Discharge: 2018-09-23

## 2018-09-23 DIAGNOSIS — I5042 Chronic combined systolic (congestive) and diastolic (congestive) heart failure: Secondary | ICD-10-CM

## 2018-09-27 ENCOUNTER — Encounter: Admit: 2018-09-27 | Discharge: 2018-09-27

## 2018-09-27 DIAGNOSIS — Z95810 Presence of automatic (implantable) cardiac defibrillator: Secondary | ICD-10-CM

## 2018-09-27 DIAGNOSIS — I5042 Chronic combined systolic (congestive) and diastolic (congestive) heart failure: Secondary | ICD-10-CM

## 2018-09-27 DIAGNOSIS — I255 Ischemic cardiomyopathy: Secondary | ICD-10-CM

## 2018-10-01 ENCOUNTER — Encounter: Admit: 2018-10-01 | Discharge: 2018-10-01

## 2018-10-01 DIAGNOSIS — M961 Postlaminectomy syndrome, not elsewhere classified: Secondary | ICD-10-CM

## 2018-10-01 DIAGNOSIS — I2510 Atherosclerotic heart disease of native coronary artery without angina pectoris: Secondary | ICD-10-CM

## 2018-10-01 DIAGNOSIS — J45909 Unspecified asthma, uncomplicated: Secondary | ICD-10-CM

## 2018-10-01 DIAGNOSIS — N183 Chronic kidney disease, stage 3 (moderate): Secondary | ICD-10-CM

## 2018-10-01 DIAGNOSIS — I5042 Chronic combined systolic (congestive) and diastolic (congestive) heart failure: Secondary | ICD-10-CM

## 2018-10-01 DIAGNOSIS — K219 Gastro-esophageal reflux disease without esophagitis: Secondary | ICD-10-CM

## 2018-10-01 DIAGNOSIS — J302 Other seasonal allergic rhinitis: Secondary | ICD-10-CM

## 2018-10-01 DIAGNOSIS — J449 Chronic obstructive pulmonary disease, unspecified: Secondary | ICD-10-CM

## 2018-10-01 DIAGNOSIS — I10 Essential (primary) hypertension: Secondary | ICD-10-CM

## 2018-10-01 DIAGNOSIS — Z95828 Presence of other vascular implants and grafts: Secondary | ICD-10-CM

## 2018-10-01 DIAGNOSIS — N401 Enlarged prostate with lower urinary tract symptoms: Secondary | ICD-10-CM

## 2018-10-01 DIAGNOSIS — Z8614 Personal history of Methicillin resistant Staphylococcus aureus infection: Secondary | ICD-10-CM

## 2018-10-01 DIAGNOSIS — Z9981 Dependence on supplemental oxygen: Secondary | ICD-10-CM

## 2018-10-01 DIAGNOSIS — I714 Abdominal aortic aneurysm, without rupture: Secondary | ICD-10-CM

## 2018-10-01 DIAGNOSIS — J479 Bronchiectasis, uncomplicated: Secondary | ICD-10-CM

## 2018-10-01 DIAGNOSIS — E669 Obesity, unspecified: Secondary | ICD-10-CM

## 2018-10-01 DIAGNOSIS — I509 Heart failure, unspecified: Secondary | ICD-10-CM

## 2018-10-01 DIAGNOSIS — R05 Cough: Secondary | ICD-10-CM

## 2018-10-01 DIAGNOSIS — I429 Cardiomyopathy, unspecified: Secondary | ICD-10-CM

## 2018-10-01 DIAGNOSIS — J189 Pneumonia, unspecified organism: Secondary | ICD-10-CM

## 2018-10-01 DIAGNOSIS — R0600 Dyspnea, unspecified: Secondary | ICD-10-CM

## 2018-10-01 DIAGNOSIS — M954 Acquired deformity of chest and rib: Secondary | ICD-10-CM

## 2018-10-01 DIAGNOSIS — E782 Mixed hyperlipidemia: Secondary | ICD-10-CM

## 2018-10-01 DIAGNOSIS — G4733 Obstructive sleep apnea (adult) (pediatric): Secondary | ICD-10-CM

## 2018-10-01 DIAGNOSIS — L03116 Cellulitis of left lower limb: Secondary | ICD-10-CM

## 2018-10-01 DIAGNOSIS — R609 Edema, unspecified: Secondary | ICD-10-CM

## 2018-10-01 DIAGNOSIS — Z8679 Personal history of other diseases of the circulatory system: Secondary | ICD-10-CM

## 2018-10-01 MED ORDER — SEMAGLUTIDE 1 MG/DOSE (2 MG/1.5 ML) SC PNIJ
1 mg | SUBCUTANEOUS | 11 refills | Status: DC
Start: 2018-10-01 — End: 2018-11-15
  Filled 2018-10-01: qty 3, 28d supply, fill #1

## 2018-10-01 NOTE — Progress Notes
Heart Failure Clinic Pharmacist Note    Met with patient and spouse regarding Ozempic titration. Patient currently tolerating 0.5 mg dose well and has lost approximately 10 pounds since last visit. Patient and spouse agreeable to use up last of 0.5 mg pen to get 1 mg total dose next week (2 simultaneous injections) and will ship out 1 mg pen to home this week.     Patient does not endorse any side effects at this time and is happy with his results.    Doylene Canning, [place]

## 2018-10-01 NOTE — Progress Notes
Date of Service: 10/01/2018    Ruben Wood. Ruben Wood is a 78 y.o. male.       HPI      I had opportunity of seeing Ruben Wood at [facility] for advanced heart failure and transplantation. ???A very pleasant 78 year old gentleman who usually follows with [name]. ???He saw Dr. Kathreen Cosier and 1 of our nurse practitioners [name] on 03/18/2018.He has a medical history of chronic heart failure with reduced ejection fraction which has recovered to an EF of 45%, CAD with drug-eluting stent, aneurysm of abdominal aorta, stage III CKD, hyperlipidemia, COPD with chronic bronchiectasis, hypertension, OSA, venous stasis and hypogammaglobulinemia. ???He also has a CardioMEMS device his goal is PAD 10-12 and threshold 6-14.His GDMT is limited due to orthostatic hypotension. ???For this reason Toprol-XL and losartan have been removed from his medications. ???He also takes Midodrine???5 mg twice daily. ???His symptoms of orthostatic hypotension today are???stable. ???Due to the symptoms his free light chains and serum electrophoresis pheresis were checked at the previous visit and showed normal kappa lambda ratio of 0.96. ???His kappa was 3.5 and his lambda was 3.64. ???His alpha proteins and his serum electrophoresis were elevated. ???Additionally a PYP scan was completed on 1/8 and negative for ATTR cardiac amyloidosis.  ???  He was hospitalized  on 1/8???1/13 for???pneumonia and???acute heart failure exacerbation. ???He was treated with IV Bumex for 3 days. ???He was sent home on Bumex 3 mg a.m./2 mg p.m. ???He has had chronic RLE greater than LLE and both cellulitis and DVT were ruled out this hospitalization.  ???  He reports feeling okay overall but feels???like his diuretic is not getting enough fluid off. ???He denies heart palpitations, chest pain,. ???He sleeps in the recliner and uses his CPAP machine. ???Occasionally wakes up short of breath still. ???He reports lower extremity edema, abdominal distention, cough. ???He saw his pulmonologist recently and is working on the above-mentioned interventions. ???He also takes Mucinex daily which he plans to increase to twice daily.??????I took a look at his labs and his creatinine has been elevated on labs done on 01/26/2019. ???His creatinine has bumped to 1.44 from 0.95. ???I obtain point-of-care creatinine here also and it was 1.5-1.6.      Vitals:    10/01/18 0929   BP: 115/74   BP Source: Arm, Right Upper   Pulse: 88   Temp: 37 ???C (98.6 ???F)   SpO2: 92%   Weight: 132 kg (291 lb)   Height: 1.803 m (5' 11)   PainSc: Zero     Body mass index is 40.59 kg/m???.     Past Medical History  Patient Active Problem List    Diagnosis Date Noted   ??? Complex care coordination 05/04/2017     Priority: High     Class: Acute   ??? Moderate episode of recurrent major depressive disorder (HCC) 03/12/2018   ??? Type 2 diabetes mellitus with hyperglycemia, without long-term current use of insulin (HCC) 03/12/2018   ??? Atrial fibrillation (HCC) 03/12/2018   ??? Orthostatic hypotension 12/28/2017   ??? Morbid obesity (HCC) 11/16/2017   ??? Chronic respiratory failure with hypercapnia (HCC) 11/16/2017   ??? Concussion with loss of consciousness of 30 minutes or less 11/16/2017   ??? Acute pain due to trauma 11/15/2017   ??? Episode of syncope 11/15/2017   ??? Facial laceration 11/15/2017   ??? Depression 11/15/2017   ??? Closed traumatic dislocation of right shoulder 11/15/2017   ??? Impaired mobility 11/15/2017   ???  Trauma 11/14/2017   ??? OAB (overactive bladder)      Urinary frequency, urgency, urge urinary incontinence (UUI), nocturia, nocturnal enuresis.  -- trial Mirabegron 25 mg --> improved, but persistent sx's.  -- trial Mirabegron 50 mg.     ??? Urinary incontinence, urge      See OAB A&P note.     ??? Urinary urgency      See OAB A&P note.     ??? Nocturia      See OAB A&P note.     ??? Nocturnal enuresis    ??? Constipation    ??? Swelling of left hand 10/07/2017     Resolved, unclear etiology. ??? Chronic combined systolic and diastolic CHF (congestive heart failure) (HCC) 07/13/2017   ??? Upper extremity pain, anterior, right 07/03/2017   ??? Right hand pain 07/03/2017   ??? Coronary artery disease due to lipid rich plaque 06/29/2017   ??? Oropharyngeal dysphagia 05/10/2017   ??? Bronchiectasis with acute lower respiratory infection (HCC) 05/10/2017   ??? Acute on chronic systolic congestive heart failure (HCC) 05/02/2017   ??? Acute on chronic systolic and diastolic heart failure, NYHA class 3 (HCC) 03/27/2017   ??? Urinary frequency 03/08/2017   ??? 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     ??? 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  D/c'd Tamsulosin d/t lack of symptom improvement.     ??? Enrolled in chronic care management 02/28/2017   ??? Elevated prostate specific antigen (PSA)      PNBx (03/01/2017): (L) high-grade prostatic intraepithelial neoplasia (HG PIN), 1/6 cores; [name], PA-C.     ??? Spondylolisthesis, lumbar region 12/13/2016   ??? Lactic acid acidosis 11/07/2016   ??? Hyperglycemia 11/07/2016   ??? Lumbar post-laminectomy syndrome      L4-5     ??? Iron deficiency 10/09/2016   ??? Spinal stenosis of lumbosacral region 09/20/2016   ??? Spondylolisthesis of lumbosacral region 09/20/2016   ??? Lumbar radiculopathy 09/20/2016   ??? Osteoarthritis of spine with radiculopathy, lumbosacral region 09/20/2016   ??? Venous ulcer of left leg (HCC) 09/13/2016   ??? 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 Lower extremity edema 07/13/2016   ??? 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 He had a normal response to pneumococca vaccination; he had 17/23 positive serotypes. He has normal tetanus toxoid Ab and CH50.  His T&B cell panel revealed a mildly low B-cell count at 68 right after a time of acute illness.  He had positive diphtheria antibody.    Likely multifactorial with significant comorbidities including frequent steroid use for bronchiectasis exacerbations, frequent infections, and possibly a nutritional component (low total protein).     Ref. Range 02/25/2016 09/14/2016 02/28/2017 04/19/2017 05/05/2017  09/11/17   IgG 762 - 1,488 MG/DL 161 (L) 096 (L) 045 (L) 472 (L) 492 (L) 737     I suspect his low levels are due to acute illness as they are often checked when he is actively ill, or has had a lot of steroids. IgA and IgM are within normal limits.    Over the past 2 years, he is continued to get ill within a week or 2 of discontinuing  antibiotic therapy.  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 Prophylactic antimicrobial management with Doxycycline has been beneficial for him over past 3 months. He had 1 episode of pneumonia requiring higher-strength Levofloxacin; hospitalization subsequent to this infection was for CHF and fluid overload.     Plan:  -Continue doxycycline 100 mg daily  - Recommend getting the pneumovax immunization next time he is in clinic in person and will need Repeat pneumococcal antibody levels 1 month afterwards.          ??? Moderate persistent asthma without complication 02/25/2016     He has had asthma since sometime in his 20-30s. He gets cough, chest tightness, wheezing, shortness of breath that is year round with worsening in the Spring and Fall.   Triggers: URI, hot/cold air. Additionally he has been diagnosed with bronchiectasis and emphysema; his PFTs suggest both obstructive and restrictive disease and asthma is unlikely to be the sole cause of his dyspnea.    He has negative IgE immunocaps to aeroallergens which makes the possibility of allergic asthma highly unlikely. His IgE level was 17 and he had negative ANCAs, MPO and PR3.     Repeatedly being treated with steroids now for flares.  Also on hypertonic saline 4 times a day, duo nebs, Symbicort, and Singulair as well as aggressive pulmonary clearance.    - Continue use of Aerobika  - Start use of EMST 150 Expiratory Muscle Strength Trainer  - Continue following with Dr. [name] for continued management.     ??? 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 He is not having issues with urticaria.    - We recommend IgE immunocaps to aeroallergens.      ??? Chronic Rhinoconjunctivitis 02/25/2016     Perennial, since childhood.  Nasal sprays are not helpful (Flonase, Nasacort) and patient had incorrect technique with sprays with resultant nosebleeds.  AIT several times in his life, only the first round was helpful.  02/2016 - CT sinuses normal.  02/2016 - Aeroallergen IgE negative.    Currently on Zyrtec 10mg  daily, flunisolide, and Singulair 10mg  daily.     - Continue current management. He would not be a good candidate for further testing and allergy immunotherapy.  - pt feels his is stable and doing well on this regiment.      ??? 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 Etiologies include likely atopic disease, but other considerations include vasculitidies such as EGPA. Less likely parasitic infection as no bothersome GI symptoms or serpiginous rashes.    - We recommend repeating CBC w/ diff.   - We will obtain ANCAs     ??? Recurrent infections 02/25/2016     He has had 5-10 rounds of antibiotics per year for sinus infections and bronchitis. He was previously having recurrent pneumonias (3-4 times a year for 5 years). Additionally he has had cellulitis in LE requiring hospitalization and IV Abx. Although these infections could be explained by underlying disease processes (obstructive/restrictive lung diseases and venous stasis requiring vein stripping), we did an immune evaluation and found hypgammaglobulinemia (IgG) with normal protective vaccine responses. IgG returns to near normal when the patient is well and not on steroids.    > Since Dec of 2019 he has not had any more infections whether skin or lung related. Continue to be on suppressive doxycyline      ???  Postoperative visit 02/23/2016   ??? S/P left pulmonary artery pressure sensor implant placement (CardioMEMs)  02/02/2016     * Patient has CardioMEMs (Pulmonary Artery Pressure Sensor)* Please call Matthew Folks, CardioMEMs Program Coordinator [phone] or page Heart Failure Rounding Team if patient is admitted or presents to ED*  03/30/15 implant     ??? Ischemic cardiomyopathy 01/12/2016   ??? 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 Wound of skin 01/12/2016   ??? 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 Varicose veins of left lower extremity with pain 01/05/2016   ??? 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 Hypercholesterolemia 11/25/2015   ??? 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 Bronchiectasis without complication (HCC) 06/18/2015     Non-sputum producer.  Currently on Symbicort.  - Dr. [name] was able to get him a vest to wear at home for secretions and this has helped immensely with his secretions and breathing.      ??? Abnormal cortisol level 12/25/2014   ??? Venous stasis dermatitis of both lower extremities 11/04/2014   ??? Sacroiliac dysfunction 07/28/2014   ??? Osteoarthritis of spine with radiculopathy, lumbar region 07/28/2014 ??? Chronic combined systolic and diastolic congestive heart failure, NYHA class 2 (HCC) 07/19/2014     11/04/14: EF 20%, severely dilated LV with grade 1 diastolic dysfunction.  11/12/2014 In clinic today, patient appears fairly well compensated.  He does have some LEE, but no significant rales (just some at bases), no JVD sitting upright, and no sx to suggest decompensation.  We will continue metoprolol, spironolactone, and bumex at current doses.    11/17/2014 Edema much improved in lower ext, Cr trending down on last labs.  Will recheck labs today.  Advised to weigh himself daily.  Rechecking BMP today to ensure not overdoing diuretics.     On metoprolol, spironolactone.  Will need to explore allergy to ARB more as patient may benefit from ACEI.      03/30/15 CardioMEMS implant by Dr. Chales Abrahams  1. Normal cardiac output and cardiac index.  2. Normal pulmonary pressures and normal pulmonary capillary wedge pressure.  3. Successful insertion of CardioMEMS pulmonary artery pressure sensor     ??? 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      ??? CKD (chronic kidney disease) stage 3, GFR 30-59 ml/min (HCC) 08/26/2013   ??? Mixed dyslipidemia 08/26/2013     Hepatic Function    Lab Results   Component Value Date/Time    ALBUMIN 4.0 11/04/2014 12:26 AM    TOTAL PROTEIN 6.9 11/04/2014 12:26 AM    ALK PHOSPHATASE 61 11/04/2014 12:26 AM    Lab Results   Component Value Date/Time    AST (SGOT) 41* 11/04/2014 12:26 AM  ALT (SGPT) 19 11/04/2014 12:26 AM    TOTAL BILIRUBIN 1.4* 11/04/2014 12:26 AM        Lab Results Component Value Date    CHOL 148 12/15/2013    TRIG 122 12/15/2013    HDL 51 12/15/2013    LDL 88 12/15/2013    VLDL 24 12/15/2013    NONHDLCHOL 97 12/15/2013       BP Readings from Last 3 Encounters:   11/12/14 129/80   11/09/14 111/44   11/04/14 146/65     Plan:   78 y.o. with known CAD with CTO of RCA and obtuse marginal branch with high grade stenosis (90%) in mid left circ.    Advised heart healthy diet and daily exercise.    Continue high intensity statin, atorvastatin       ??? AAA (abdominal aortic aneurysm) (HCC) 10/15/2012     Duplex done at OSH in 2007 and 2008 ~ 4.1 cm    Duplex 10/15/12-AAA 7.3 cm AP x 7.3 cm       ??? History of MRSA infection 10/14/2012   ??? Chronic cough 08/29/2012   ??? CAD (coronary artery disease), native coronary artery 08/15/2012     07/11/2002- Cath @ [facility] showed Severe double vessel disease(OMB of circumflex and distal circ)  inferior-basilar dyskinesis.                  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    09/12/12   Cath - [facility]:  CTO of the right coronary artery, CTO of the obtuse marginal branch and high-grade stenosis of   90% in the mid left circumflex artery No significant stenosis in the left anterior descending artery or left main vessel     Failed attempt in opening 2nd obtuse marginal CTO as described above.    10/14/12: Unsuccessful attempt to open CTO of OMB and mid-CFX by Dr. Mackey Birchwood      04/02/17: Cath by Dr. Steward Ros:   4. Chronic total occlusion of the circumflex.  5. Chronic total occlusion of the right coronary artery, status post percutaneous coronary intervention with drug-eluting stent times 2.  6. Small distal right coronary artery perforation without echo or hemodynamic evidence of compromise. 06/29/17-cardiac catheterization by Dr. Steward Ros. One DES placed to chronic total occlusion of the circumflex artery. The RCA stent was patent.     ??? OSA on CPAP 08/13/2012     CPAP at  DME: [facility]    Split night:  08/14/2017  AHI 43.2  REM n/a  Time below 88 %: 98 mins  Lowest sat: 83%     CPAP 10 cm h20 effective      ??? GERD (gastroesophageal reflux disease) 08/13/2012     -PPI  -follows with GI     ??? 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  Discussed the importance of diet.  Exercise as tolerated.        ??? Essential hypertension 08/13/2012   ??? Chest wall deformity 07/09/2012     Chest wall reconstruction on 07/09/12  Lung herniation- bio bridge           Review of Systems   Constitution: Negative.   HENT: Negative.    Eyes: Negative.    Cardiovascular: Negative.    Respiratory: Negative.    Endocrine: Negative.    Hematologic/Lymphatic: Negative.    Skin: Negative.    Musculoskeletal: Negative.    Gastrointestinal: Negative.    Genitourinary: Negative.    Neurological: Negative.    Psychiatric/Behavioral: Negative.    Allergic/Immunologic: Negative.        Physical Exam  General Appearance: In NAD,???obese,???  Neck Veins: ???JVP 7, neck veins???fill in, difficult to assess  Chest Inspection: chest is normal in appearance   Respiratory Effort:accessory muscle use intermittently after coughing fits,???breathing comfortably, no respiratory distress   Auscultation/Percussion: lungs clear to auscultation, no rales, rhonchi or wheezing   Cardiac Rhythm: regular rhythm and normal rate   Cardiac Auscultation: S1, S2 normal, no rub, no definite S3 ???or S4   Murmurs: no murmur   Peripheral Circulation: normal peripheral circulation   Pedal Pulses: normal symmetric pedal pulses Lower Extremity Edema:???Chronic venous stasis changes, LLE>???RLE 1+ pitting edema LLE non-pitting edema RLE  Abdominal Exam:???Taut, non-tender, no obvious masses, bowel sounds normal   Gait &???Station: walks without assistance   Orientation: oriented to person, place and time   Affect &???Mood: appropriate and sustained affect   Language and Memory: patient responsive and seems to comprehend information   Neurologic Exam: neurological assessment grossly intact???  ???    Cardiovascular Studies      Problems Addressed Today  No diagnosis found.    Assessment and Plan    1. ???Heart failure with reduced ejection fraction. ???EF now is around 45%. ???NYHA class IV stage C heart failure. ???NYHA class IV could be secondary to his obesity and pulmonary disease also. ???He is at optimal pulmonary artery diastolic pressures from his CardioMEMS.  I saw him last time on 08/05/2018.  He had gained some weight therefore we increase Bumex to 2 mg by mouth once daily.  He has done remarkably well since then.  We also started him on Ozempic for obesity.  He is right now at 0.5 MCG per KG dose.  Weight has gone down by 10 pounds.  He is on Bumex 2 mg daily.  CardioMEMS numbers have been at target.???  1.??? Recommendations:     No change in medication today.  We will monitor CardioMEMS numbers and may be dropped his Bumex to 1 mg daily.  His Ozempic dose will be uptitrated to 1 mg/kg.  Continue cardiac rehab.      ???  ???  40 minutes of time spent with patient and family.  Greater than 30 minutes of this time spent counseling regarding heart failure with reduced ejection fraction, medications and volume control.  Vanetta Shawl M.D  Advance Heart Failure and Transplant Cardiologist  Current Medications (including today's revisions)  ??? acetaminophen (TYLENOL) 325 mg tablet Take two tablets by mouth every 6 hours as needed for Pain.   ??? albuterol 0.083% (PROVENTIL; VENTOLIN) 2.5 mg /3 mL (0.083 %) nebulizer solution Inhale 3 mL solution by nebulizer as directed every 6 hours as needed for Wheezing or Shortness of Breath. Indications: asthma   ??? albuterol sulfate (PROAIR HFA) 90 mcg/actuation aerosol inhaler Inhale two puffs by mouth into the lungs every 4 hours as needed for Wheezing or Shortness of Breath.   ??? allopurinoL (ZYLOPRIM) 300 mg tablet Take one tablet by mouth daily. Take  with food.   ??? AMITR/GABAPEN/EMU OIL 05/24/08% CREAM (COMPOUND) Apply 5 grams (10 pumps) topically to affected area three times daily.   ??? Ammonium Lactate-Emu Oil (EMU-LAC) 10 % crea Apply  topically to affected area. Apply to feet    ??? ascorbic acid (VITAMIN C) 500 mg tablet Take one tablet by mouth daily.   ??? aspirin EC 81 mg tablet Take one tablet by mouth daily. Take with food.   ??? atorvastatin (LIPITOR) 40 mg tablet TAKE (1) TABLET BY MOUTH ONCE DAILY   ??? azithromycin (ZITHROMAX) 250 mg tablet Z-PAK: Take 2 tabs by mouth on day 1, followed by 1 tab by mouth daily on days 2-5.   ??? benzonatate (TESSALON PERLES) 100 mg capsule Take two capsules by mouth every 8 hours as needed for Cough.   ??? budesonide/formoterol (SYMBICORT HFA) 160/4.5 mcg inhalation Inhale two puffs by mouth into the lungs twice daily. Rinse mouth after use.   ??? bumetanide (BUMEX) 2 mg tablet Take one tablet by mouth daily. Take extra dose for swelling   ??? cholecalciferol (VITAMIN D-3) 1,000 units tablet Take 1,000 Units by mouth daily.   ??? docusate (COLACE) 100 mg capsule Take 100 mg by mouth daily as needed for Constipation.   ??? doxycycline (MONODOX) 100 mg capsule Take one capsule by mouth daily.   ??? duloxetine DR (CYMBALTA) 60 mg capsule Take one capsule by mouth daily.   ??? finasteride (PROSCAR) 5 mg tablet Take one tablet by mouth daily.   ??? fish oil- omega 3-DHA/EPA 300/1,000 mg capsule Take 1 capsule by mouth daily.   ??? HYDROcodone/acetaminophen (NORCO) 5/325 mg tablet Take one tablet by mouth every 4 hours as needed for Pain ??? ipratropium bromide (ATROVENT) 42 mcg (0.06 %) nasal spray 1 spray to each nostril Q6H PRN   ??? midodrine (PROAMATINE) 5 mg tablet Take one tablet by mouth twice daily. Take AM dose first thing in AM, and second dose at 2-3 PM.   ??? milk of magnesia (CONC) 2,400 mg/10 mL oral suspension Take  by mouth daily as needed for Heartburn.   ??? montelukast (SINGULAIR) 10 mg tablet Take one tablet by mouth at bedtime daily.   ??? nitroglycerin (NITROSTAT) 0.4 mg tablet Place 0.4 mg under tongue every 5 minutes as needed for Chest Pain. Max of 3 tablets, call 911.   ??? pantoprazole DR (PROTONIX) 40 mg tablet TAKE (1) TABLET BY MOUTH TWO TIMES A DAY.   ??? polyethylene glycol 3350 (MIRALAX) 17 g packet Take one packet by mouth as Needed.   ??? potassium chloride (MICRO-K) 10 mEq capsule Take one capsule by mouth daily. Take with a meal and a full glass of water.   ??? semaglutide (OZEMPIC) 0.25 mg or 0.5 mg(2 mg/1.5 mL) injection PEN Inject one-quarter mg under the skin every 7 days for 28 days, THEN one-half mg every 7 days for 28 days.   ??? sodium chloride 3 % nebulizer solution Inhale 4 mL by mouth into the lungs twice daily. Use with [place].  Indications: asthma, chronic cough, bronchiectasis   ??? spironolactone (ALDACTONE) 25 mg tablet Take one tablet by mouth twice daily. Take with food.   ??? tamsulosin (FLOMAX) 0.4 mg capsule Take 1 capsule by mouth daily.   ??? traMADol (ULTRAM) 50 mg tablet Take one tablet by mouth every 8 hours as needed for Pain.   ??? zinc oxide 20 % topical ointment Place twice a day on the buttock wound

## 2018-10-01 NOTE — Progress Notes
Daily CardioMEMS Readings    Goal PA Diastolic: 67-61 mmHg   PA Diastolic Thresholds: 9-50 mmHg      Taken on PA Systolic PA Diastolic PA Mean Heart Rate   10-01-2018, 05:40 AM 28 mmHg 11 mmHg 17 mmHg 102 bpm   09-30-2018, 10:07 AM 28 mmHg 10 mmHg 16 mmHg 90 bpm   09-29-2018, 11:21 AM 26 mmHg 10 mmHg 16 mmHg 105 bpm   09-27-2018, 05:26 PM 28 mmHg 12 mmHg 17 mmHg 96 bpm   09-26-2018, 09:38 AM 30 mmHg 12 mmHg 18 mmHg 100 bpm   09-25-2018, 01:19 PM 28 mmHg 10 mmHg 16 mmHg 106 bpm   09-24-2018, 10:06 AM 27 mmHg 10 mmHg 15 mmHg 99 bpm   09-23-2018, 09:50 AM 28 mmHg 9 mmHg 15 mmHg 93 bpm   09-22-2018, 09:38 AM 27 mmHg 9 mmHg 15 mmHg 97 bpm   09-20-2018, 07:22 AM 23 mmHg 8 mmHg 13 mmHg 108 bpm   09-19-2018, 09:48 AM 27 mmHg 10 mmHg 15 mmHg 102 bpm   09-18-2018, 01:17 PM 29 mmHg 10 mmHg 16 mmHg 110 bpm   09-17-2018, 10:33 AM 27 mmHg 11 mmHg 16 mmHg 82 bpm   09-16-2018, 08:26 AM 25 mmHg 8 mmHg 14 mmHg 98 bpm   09-15-2018, 08:50 AM 25 mmHg 9 mmHg 15 mmHg 105 bpm   09-14-2018, 07:45 PM 30 mmHg 12 mmHg 18 mmHg 120 bpm   09-13-2018, 05:50 PM 26 mmHg 10 mmHg 15 mmHg 109 bpm   09-12-2018, 09:53 AM 27 mmHg 9 mmHg 15 mmHg 98 bpm   09-11-2018, 10:34 AM 29 mmHg 8 mmHg 15 mmHg 96 bpm   09-10-2018, 03:09 PM 32 mmHg 12 mmHg 18 mmHg 90 bpm   09-09-2018, 09:47 AM 28 mmHg 8 mmHg 15 mmHg 95 bpm   09-08-2018, 08:22 AM 27 mmHg 10 mmHg 15 mmHg 94 bpm   09-07-2018, 10:27 AM 30 mmHg 10 mmHg 17 mmHg 101 bpm   09-05-2018, 10:01 AM 23 mmHg 8 mmHg 13 mmHg 98 bpm   09-04-2018, 02:20 PM 25 mmHg 8 mmHg 14 mmHg 88 bpm   09-02-2018, 09:51 AM 25 mmHg 9 mmHg 14 mmHg 91 bpm   09-01-2018, 06:11 PM 27 mmHg 8 mmHg 14 mmHg 106 bpm   08-30-2018, 12:47 PM 28 mmHg 9 mmHg 15 mmHg 81 bpm   08-29-2018, 09:16 AM 31 mmHg 12 mmHg 18 mmHg 92 bpm   08-28-2018, 12:36 PM 27 mmHg 10 mmHg 16 mmHg 98 bpm

## 2018-10-02 ENCOUNTER — Ambulatory Visit: Admit: 2018-10-01 | Discharge: 2018-10-02

## 2018-10-02 DIAGNOSIS — I5032 Chronic diastolic (congestive) heart failure: Principal | ICD-10-CM

## 2018-10-07 ENCOUNTER — Encounter: Admit: 2018-10-07 | Discharge: 2018-10-07 | Payer: MEDICARE

## 2018-10-07 MED ORDER — DOXYCYCLINE MONOHYDRATE 100 MG PO CAP
ORAL_CAPSULE | Freq: Every day | 0 refills | 10.00000 days | Status: DC
Start: 2018-10-07 — End: 2019-02-07

## 2018-10-07 NOTE — Telephone Encounter
Pharmacy requesting a refill of Doxycycline. Patient last seen 07-08-18. No follow up scheduled. Per last OV note,   Over the past 2 years, he is continued to get ill within a week or 2 of discontinuing antibiotic therapy.  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 Prophylactic antimicrobial management with Doxycycline has been beneficial for him over past 3 months. He had 1 episode of pneumonia requiring higher-strength Levofloxacin; hospitalization subsequent to this infection was for CHF and fluid overload.     - Continue doxycycline 100 mg daily  - Recommend getting the pneumovax immunization next time he is in clinic in person and will need Repeat pneumococcal antibody levels 1 month afterwards.  Hopefully, he will have an improved response tnow that he is not on higher doses of steroids.        Unable to refill per Allergy/Immunology standing orders protocol as antibiotics not covered by protocol.  Will route to Dr. Hyman Bible for her approval/disapproval and to Susanne Greenhouse for scheduling. Patient needs 6 month follow up.

## 2018-10-18 ENCOUNTER — Encounter: Admit: 2018-10-18 | Discharge: 2018-10-18

## 2018-10-18 DIAGNOSIS — I5042 Chronic combined systolic (congestive) and diastolic (congestive) heart failure: Secondary | ICD-10-CM

## 2018-10-18 MED ORDER — PREDNISONE 10 MG PO TAB
10 mg | ORAL_TABLET | Freq: Every day | ORAL | 0 refills | Status: AC
Start: 2018-10-18 — End: ?

## 2018-10-18 NOTE — Telephone Encounter
During routine CM phone call, patient noted to be short of breath, coughing and some audible wheezing.     He reports this is his "usual" symptoms.   Wife just noticing this am that it is worse today than it has been in awhile.   No fever, lots of mucus.     Will route to PCP to advise

## 2018-10-18 NOTE — Telephone Encounter
Wife notified of above.   She verbalized understanding.   She will seek ED evaluation for patient if symptoms worsen.   CM will plan to call on Monday for update.

## 2018-10-18 NOTE — Telephone Encounter
Let's start with a prednisone burst, 40mg  Qday x 5 days.  If things get worse over the weekend, I want him to go to the ED. Please call on Monday to get update.

## 2018-10-18 NOTE — Progress Notes
Patient reached for routine CM outreach.   Wife [name] expressed some concerns.     1.  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   2.  PT- needs to be renewed. Was wondering if Dr. Gibson Ramp would order. It is done through the [facility].   Fax for referral is [phone].   Was previously ordered as strengthening of upper and lower extremities and cardio, not cardiac rehab.     When speaking to patient, he was having a lot of coughing and some audible wheezing.   Wife back on phone and stating it is worse than it has been recently.   Started to exacerbate this am.   Lots of mucus, but no fever.   Had just had his vibrating vest on and this can bring up more mucus.     Will route to PCP to advise.

## 2018-10-21 ENCOUNTER — Encounter: Admit: 2018-10-21 | Discharge: 2018-10-21

## 2018-10-21 DIAGNOSIS — J4550 Severe persistent asthma, uncomplicated: Secondary | ICD-10-CM

## 2018-10-21 MED ORDER — MONTELUKAST 10 MG PO TAB
10 mg | ORAL_TABLET | Freq: Every evening | ORAL | 1 refills | 90.00000 days | Status: AC
Start: 2018-10-21 — End: ?

## 2018-10-21 NOTE — Telephone Encounter
Called patient to check on respiratory status after starting steroid burst on Friday.   Patient is at PT and CM spoke with wife [place].     States prednisone has been helpful.   Not having coughing fits or choking on mucus as he was on Friday morning.  He is not having audible wheezing. States he has been able to cough up large amounts of mucus, which has helped respiratory symptoms.     Patient felt well enough to attend PT today.     Will route to PCP as FYI.

## 2018-10-21 NOTE — Progress Notes
PT order faxed through Epic to [facility] department.

## 2018-10-21 NOTE — Telephone Encounter
Patient last seen in clinic 4.28.20 with plan to continue medication. RN will refill per protocol.

## 2018-10-24 ENCOUNTER — Ambulatory Visit: Admit: 2018-10-24 | Discharge: 2018-10-24

## 2018-10-24 DIAGNOSIS — I5042 Chronic combined systolic (congestive) and diastolic (congestive) heart failure: Secondary | ICD-10-CM

## 2018-11-12 ENCOUNTER — Encounter: Admit: 2018-11-12 | Discharge: 2018-11-12 | Payer: MEDICARE

## 2018-11-13 ENCOUNTER — Encounter: Admit: 2018-11-13 | Discharge: 2018-11-13 | Payer: MEDICARE

## 2018-11-13 NOTE — Telephone Encounter
Patient's wife called stating her husband has a deep cough with congestion and his stomach hasn't been feeling well.  They feel he needs to see Dr. Gibson Ramp.    No clinic openings at all this week with any of the providers.    Routing to Dr. Gibson Ramp to advise  Myrtie Hawk, RN

## 2018-11-13 NOTE — Telephone Encounter
I called patient's wife and advised the ED per Dr. Real Cons recommendations.    She states he won't go to the ED,but was hoping Dr. Gibson Ramp would prescribe antibiotic and prednisone as this helped the last time.  She's willing to take him for a chest x-ray at their local hospital if Dr. Gibson Ramp wants that ordered.  They are aware that this won't get addressed until tomorrow.    She expressed he really needed to see Dr. Gibson Ramp for a possible foot injection, memory loss, and follow up the end of October. I booked a long patient return visit on 12/20/2018 during procedure clinic as this is the only opening.    Routing to Dr. Gibson Ramp to advise on rx for cough  Myrtie Hawk, RN

## 2018-11-13 NOTE — Telephone Encounter
Charted in another encounter

## 2018-11-14 ENCOUNTER — Encounter: Admit: 2018-11-14 | Discharge: 2018-11-14 | Payer: MEDICARE

## 2018-11-14 MED ORDER — AZITHROMYCIN 250 MG PO TAB
ORAL_TABLET | Freq: Every day | 0 refills | Status: DC
Start: 2018-11-14 — End: 2018-12-06

## 2018-11-14 MED ORDER — PREDNISONE 20 MG PO TAB
40 mg | ORAL_TABLET | Freq: Every day | ORAL | 0 refills | Status: AC
Start: 2018-11-14 — End: ?

## 2018-11-15 ENCOUNTER — Encounter: Admit: 2018-11-15 | Discharge: 2018-11-15 | Payer: MEDICARE

## 2018-11-15 NOTE — Telephone Encounter
Heart Failure Clinic Pharmacist Note    Received call earlier in the week from patient spouse regarding Ozempic RX from [facility] and discrepancy between amount dispensed. Requested Mindy Horton reach out to patient to try and resolve issue. Spouse states patient will no longer take medication. Offered to send RX to local pharmacy or explore patient assistance options. Spouse declined these options. No further follow up required.     Shearon Stalls, [place]  [phone]

## 2018-11-20 ENCOUNTER — Encounter: Admit: 2018-11-20 | Discharge: 2018-11-20 | Payer: MEDICARE

## 2018-11-20 NOTE — Telephone Encounter
I called patient's wife providing Dr. Real Cons verbal recommendation for him to be evaluated at ED or Urgent Care center.  She verbalizes understanding.  She's not sure if he will agree to go today, but has therapy tomorrow @ [facility] and will definitely be seen tomorrow if not today.  She will call me back with an update.    Myrtie Hawk, RN

## 2018-11-20 NOTE — Telephone Encounter
Patient's wife called stating that medication improved his symptoms last week, but the deep cough and congestion have returned today.  She is asking what Dr. Gibson Ramp would advise him to do.    Routing to Dr. Gibson Ramp to advise  Myrtie Hawk, RN

## 2018-11-21 ENCOUNTER — Encounter: Admit: 2018-11-21 | Discharge: 2018-11-21 | Payer: MEDICARE

## 2018-11-25 LAB — PROSTATIC SPECIFIC ANTIGEN-PSA: Lab: 2.9 ng/mL (ref 0.00–4.00)

## 2018-11-26 ENCOUNTER — Encounter: Admit: 2018-11-26 | Discharge: 2018-11-26 | Payer: MEDICARE

## 2018-12-02 ENCOUNTER — Encounter: Admit: 2018-12-02 | Discharge: 2018-12-02 | Payer: MEDICARE

## 2018-12-02 NOTE — Progress Notes
CM spoke to patient following evaluation by MD in [place].   States she was given an antibiotic, Levaquin and prednisone 10mg .   He was instructed to take 30mg  daily x 3 days.     CM will route to PCP to advise.

## 2018-12-02 NOTE — Telephone Encounter
Pt calling to cancel and reschedule appointment today due to he & wife both ill.    Per Ihor Gully PA-C, ok to reschedule for telehealth.

## 2018-12-02 NOTE — Telephone Encounter
Spoke to Mrs. Fernande Boyden, offered telehealth appt for 12/1.  She states pt would prefer in-person clinic appt.  Appt re-scheduled to 10/19.

## 2018-12-02 NOTE — Progress Notes
Call placed to patient.   Chart notes indicate patient had cancelled urology appointment today due to illness.   Patient was also having illness complaints 1+ weeks ago.     Wife reached by phone.   States they are currently in [place] at the [place] office waiting to be seen.     CM will touch base later today to determine outcome of physician's visit to communicate with PCP.

## 2018-12-04 ENCOUNTER — Inpatient Hospital Stay: Admit: 2018-12-04 | Discharge: 2018-12-04 | Payer: MEDICARE

## 2018-12-04 ENCOUNTER — Ambulatory Visit: Admit: 2018-12-04 | Discharge: 2018-12-05 | Payer: MEDICARE

## 2018-12-04 ENCOUNTER — Inpatient Hospital Stay: Admit: 2018-12-04 | Payer: MEDICARE

## 2018-12-04 ENCOUNTER — Encounter: Admit: 2018-12-04 | Discharge: 2018-12-04 | Payer: MEDICARE

## 2018-12-04 DIAGNOSIS — I2510 Atherosclerotic heart disease of native coronary artery without angina pectoris: Secondary | ICD-10-CM

## 2018-12-04 DIAGNOSIS — N401 Enlarged prostate with lower urinary tract symptoms: Secondary | ICD-10-CM

## 2018-12-04 DIAGNOSIS — J439 Emphysema, unspecified: Secondary | ICD-10-CM

## 2018-12-04 DIAGNOSIS — G4733 Obstructive sleep apnea (adult) (pediatric): Secondary | ICD-10-CM

## 2018-12-04 DIAGNOSIS — J449 Chronic obstructive pulmonary disease, unspecified: Secondary | ICD-10-CM

## 2018-12-04 DIAGNOSIS — E782 Mixed hyperlipidemia: Secondary | ICD-10-CM

## 2018-12-04 DIAGNOSIS — I714 Abdominal aortic aneurysm, without rupture: Secondary | ICD-10-CM

## 2018-12-04 DIAGNOSIS — I5042 Chronic combined systolic (congestive) and diastolic (congestive) heart failure: Secondary | ICD-10-CM

## 2018-12-04 DIAGNOSIS — R609 Edema, unspecified: Secondary | ICD-10-CM

## 2018-12-04 DIAGNOSIS — J479 Bronchiectasis, uncomplicated: Secondary | ICD-10-CM

## 2018-12-04 DIAGNOSIS — R05 Cough: Secondary | ICD-10-CM

## 2018-12-04 DIAGNOSIS — J302 Other seasonal allergic rhinitis: Secondary | ICD-10-CM

## 2018-12-04 DIAGNOSIS — I429 Cardiomyopathy, unspecified: Secondary | ICD-10-CM

## 2018-12-04 DIAGNOSIS — N183 CKD (chronic kidney disease) stage 3, GFR 30-59 ml/min: Secondary | ICD-10-CM

## 2018-12-04 DIAGNOSIS — K219 Gastro-esophageal reflux disease without esophagitis: Secondary | ICD-10-CM

## 2018-12-04 DIAGNOSIS — I10 Essential (primary) hypertension: Secondary | ICD-10-CM

## 2018-12-04 DIAGNOSIS — J189 Pneumonia, unspecified organism: Secondary | ICD-10-CM

## 2018-12-04 DIAGNOSIS — L03116 Cellulitis of left lower limb: Secondary | ICD-10-CM

## 2018-12-04 DIAGNOSIS — Z95828 Presence of other vascular implants and grafts: Secondary | ICD-10-CM

## 2018-12-04 DIAGNOSIS — J45909 Unspecified asthma, uncomplicated: Secondary | ICD-10-CM

## 2018-12-04 DIAGNOSIS — Z8614 Personal history of Methicillin resistant Staphylococcus aureus infection: Secondary | ICD-10-CM

## 2018-12-04 DIAGNOSIS — R0600 Dyspnea, unspecified: Secondary | ICD-10-CM

## 2018-12-04 DIAGNOSIS — M961 Postlaminectomy syndrome, not elsewhere classified: Secondary | ICD-10-CM

## 2018-12-04 DIAGNOSIS — Z9981 Dependence on supplemental oxygen: Secondary | ICD-10-CM

## 2018-12-04 DIAGNOSIS — I509 Heart failure, unspecified: Secondary | ICD-10-CM

## 2018-12-04 DIAGNOSIS — Z8679 Personal history of other diseases of the circulatory system: Secondary | ICD-10-CM

## 2018-12-04 DIAGNOSIS — M954 Acquired deformity of chest and rib: Secondary | ICD-10-CM

## 2018-12-04 MED ORDER — DOXYCYCLINE HYCLATE 100 MG PO TAB
100 mg | Freq: Two times a day (BID) | ORAL | 0 refills | Status: DC
Start: 2018-12-04 — End: 2018-12-05
  Administered 2018-12-05: 09:00:00 100 mg via ORAL

## 2018-12-04 MED ORDER — FINASTERIDE 5 MG PO TAB
5 mg | Freq: Every day | ORAL | 0 refills | Status: DC
Start: 2018-12-04 — End: 2018-12-15
  Administered 2018-12-05 – 2018-12-15 (×11): 5 mg via ORAL

## 2018-12-04 MED ORDER — BENZONATATE 100 MG PO CAP
200 mg | ORAL | 0 refills | Status: DC | PRN
Start: 2018-12-04 — End: 2018-12-15
  Administered 2018-12-06 – 2018-12-12 (×4): 200 mg via ORAL

## 2018-12-04 MED ORDER — MELATONIN 3 MG PO TAB
3 mg | Freq: Every evening | ORAL | 0 refills | Status: DC | PRN
Start: 2018-12-04 — End: 2018-12-15
  Administered 2018-12-10 – 2018-12-15 (×6): 3 mg via ORAL

## 2018-12-04 MED ORDER — MAGNESIUM OXIDE 400 MG (241.3 MG MAGNESIUM) PO TAB
400 mg | Freq: Two times a day (BID) | ORAL | 0 refills | Status: DC
Start: 2018-12-04 — End: 2018-12-15
  Administered 2018-12-05 – 2018-12-15 (×21): 400 mg via ORAL

## 2018-12-04 MED ORDER — SPIRONOLACTONE 25 MG PO TAB
25 mg | Freq: Two times a day (BID) | ORAL | 0 refills | Status: DC
Start: 2018-12-04 — End: 2018-12-15
  Administered 2018-12-05 – 2018-12-15 (×21): 25 mg via ORAL

## 2018-12-04 MED ORDER — ASCORBIC ACID (VITAMIN C) 500 MG PO TAB
500 mg | Freq: Every day | ORAL | 0 refills | Status: DC
Start: 2018-12-04 — End: 2018-12-15
  Administered 2018-12-05 – 2018-12-15 (×11): 500 mg via ORAL

## 2018-12-04 MED ORDER — ACETAMINOPHEN 325 MG PO TAB
650 mg | ORAL | 0 refills | Status: DC | PRN
Start: 2018-12-04 — End: 2018-12-15
  Administered 2018-12-06 – 2018-12-14 (×4): 650 mg via ORAL

## 2018-12-04 MED ORDER — CHOLECALCIFEROL (VITAMIN D3) 25 MCG (1,000 UNIT) PO TAB
1000 [IU] | Freq: Every day | ORAL | 0 refills | Status: DC
Start: 2018-12-04 — End: 2018-12-15
  Administered 2018-12-05 – 2018-12-15 (×11): 1000 [IU] via ORAL

## 2018-12-04 MED ORDER — ALBUTEROL SULFATE 90 MCG/ACTUATION IN HFAA
2 | RESPIRATORY_TRACT | 0 refills | Status: DC | PRN
Start: 2018-12-04 — End: 2018-12-15

## 2018-12-04 MED ORDER — ONDANSETRON HCL 4 MG PO TAB
4 mg | ORAL | 0 refills | Status: DC | PRN
Start: 2018-12-04 — End: 2018-12-15

## 2018-12-04 MED ORDER — ATORVASTATIN 40 MG PO TAB
40 mg | Freq: Every day | ORAL | 0 refills | Status: DC
Start: 2018-12-04 — End: 2018-12-15
  Administered 2018-12-05 – 2018-12-15 (×11): 40 mg via ORAL

## 2018-12-04 MED ORDER — POLYETHYLENE GLYCOL 3350 17 GRAM PO PWPK
17 g | ORAL | 0 refills | Status: DC | PRN
Start: 2018-12-04 — End: 2018-12-15
  Administered 2018-12-08 – 2018-12-12 (×4): 17 g via ORAL

## 2018-12-04 MED ORDER — SODIUM CHLORIDE 0.9 % IV SOLP
500 mL | INTRAVENOUS | 0 refills | Status: AC
Start: 2018-12-04 — End: ?
  Administered 2018-12-05: 04:00:00 500 mL via INTRAVENOUS

## 2018-12-04 MED ORDER — TAMSULOSIN 0.4 MG PO CAP
.4 mg | Freq: Every day | ORAL | 0 refills | Status: DC
Start: 2018-12-04 — End: 2018-12-15
  Administered 2018-12-05 – 2018-12-15 (×11): 0.4 mg via ORAL

## 2018-12-04 MED ORDER — DOCUSATE SODIUM 100 MG PO CAP
100 mg | Freq: Every day | ORAL | 0 refills | Status: DC | PRN
Start: 2018-12-04 — End: 2018-12-15
  Administered 2018-12-08 – 2018-12-12 (×4): 100 mg via ORAL

## 2018-12-04 MED ORDER — ONDANSETRON HCL (PF) 4 MG/2 ML IJ SOLN
4 mg | INTRAVENOUS | 0 refills | Status: DC | PRN
Start: 2018-12-04 — End: 2018-12-15

## 2018-12-04 MED ORDER — DULOXETINE 60 MG PO CPDR
60 mg | Freq: Every day | ORAL | 0 refills | Status: DC
Start: 2018-12-04 — End: 2018-12-15
  Administered 2018-12-05 – 2018-12-15 (×11): 60 mg via ORAL

## 2018-12-04 MED ORDER — MONTELUKAST 10 MG PO TAB
10 mg | Freq: Every evening | ORAL | 0 refills | Status: DC
Start: 2018-12-04 — End: 2018-12-15
  Administered 2018-12-05 – 2018-12-15 (×11): 10 mg via ORAL

## 2018-12-04 MED ORDER — ALLOPURINOL 300 MG PO TAB
300 mg | Freq: Every day | ORAL | 0 refills | Status: DC
Start: 2018-12-04 — End: 2018-12-05

## 2018-12-04 MED ORDER — CEFTRIAXONE INJ 1GM IVP
1 g | INTRAVENOUS | 0 refills | Status: DC
Start: 2018-12-04 — End: 2018-12-05

## 2018-12-04 MED ORDER — BUDESONIDE-FORMOTEROL 160-4.5 MCG/ACTUATION IN HFAA
2 | Freq: Two times a day (BID) | RESPIRATORY_TRACT | 0 refills | Status: DC
Start: 2018-12-04 — End: 2018-12-15
  Administered 2018-12-05 – 2018-12-14 (×2): 2 via RESPIRATORY_TRACT

## 2018-12-04 MED ORDER — PANTOPRAZOLE 40 MG PO TBEC
40 mg | Freq: Two times a day (BID) | ORAL | 0 refills | Status: DC
Start: 2018-12-04 — End: 2018-12-15
  Administered 2018-12-05 – 2018-12-15 (×22): 40 mg via ORAL

## 2018-12-04 MED ORDER — ASPIRIN 81 MG PO TBEC
81 mg | Freq: Every day | ORAL | 0 refills | Status: DC
Start: 2018-12-04 — End: 2018-12-15
  Administered 2018-12-05 – 2018-12-15 (×11): 81 mg via ORAL

## 2018-12-04 MED ORDER — CEFTRIAXONE INJ 1GM IVP
1 g | INTRAVENOUS | 0 refills | Status: DC
Start: 2018-12-04 — End: 2018-12-05
  Administered 2018-12-05: 09:00:00 1 g via INTRAVENOUS

## 2018-12-04 MED ORDER — HEPARIN, PORCINE (PF) 5,000 UNIT/0.5 ML IJ SYRG
5000 [IU] | SUBCUTANEOUS | 0 refills | Status: DC
Start: 2018-12-04 — End: 2018-12-05
  Administered 2018-12-05 (×2): 5000 [IU] via SUBCUTANEOUS

## 2018-12-04 MED ORDER — TRAMADOL 50 MG PO TAB
50 mg | ORAL | 0 refills | Status: DC | PRN
Start: 2018-12-04 — End: 2018-12-15
  Administered 2018-12-06: 07:00:00 50 mg via ORAL

## 2018-12-04 MED ORDER — ASPIRIN 325 MG PO TAB
325 mg | Freq: Once | ORAL | 0 refills | Status: CP
Start: 2018-12-04 — End: ?
  Administered 2018-12-05: 04:00:00 325 mg via ORAL

## 2018-12-04 NOTE — Progress Notes
Did patient read financial policy, consent to treat, and notice of privacy practices? Yes    Does the patient give verbal consent to each policy? Yes    Does the patient have any vitals to report? No    N/A Vitals charted in O2? N/A    Is the patient in pain? 10 = Worst pain ever    Screening questions completed? Yes    Is the patient able to acces the Mychart message with the start visit link? Yes    Is patient in "virtual waiting room" Yes    Cayman Kielbasa, RN

## 2018-12-04 NOTE — Progress Notes
History of Present Illness    Ruben Wood. is a 78 y.o. male with multiple medical problems including CAD, Systolic and Diastolic HF, HLD, OSA on CPAP, morbid obesity, chronic pain, and COPD who presents today with cough.     Obtained patient's verbal consent to treat them and their agreement to [facility] financial policy and NPP via this telehealth visit during the [facility]    Ed has bronchiectasis for which he is on chronic antibiotic suppressive therapy.  He started to develop acute worsening of his cough and shortness of breath about 1 month ago.  I started him on azithromycin and a prednisone burst.  His symptoms continued to worsen, so he was seen in his local emergency department.  He had a chest x-ray performed and he was told that he did not have any pneumonia.  He had lab work performed he was told that it did not appear that he had any heart failure.  We do not have those records to confirm.  He was prescribed levofloxacin as well as prednisone.  He has been on that regimen now for the past 3 days, but he does not feel like his symptoms are improving.  He endorses shortness of breath even at rest.  He is coughing nearly nonstop.  His cough is productive of sputum.  He does not have any change in his lower extremity edema.  He denies any fevers or chills.  His wife, who is also present on this visit, has upper respiratory infectious symptoms as well.       Review of Systems   HENT: Positive for congestion and rhinorrhea. Negative for ear discharge, ear pain and sore throat.    Respiratory: Positive for cough and shortness of breath. Negative for chest tightness.    Cardiovascular: Negative for chest pain and leg swelling.   Gastrointestinal: Negative for diarrhea, nausea and vomiting.   Skin: Negative for wound.       Objective:         ? acetaminophen (TYLENOL) 325 mg tablet Take two tablets by mouth every 6 hours as needed for Pain. ? albuterol 0.083% (PROVENTIL; VENTOLIN) 2.5 mg /3 mL (0.083 %) nebulizer solution Inhale 3 mL solution by nebulizer as directed every 6 hours as needed for Wheezing or Shortness of Breath. Indications: asthma   ? albuterol sulfate (PROAIR HFA) 90 mcg/actuation aerosol inhaler Inhale two puffs by mouth into the lungs every 4 hours as needed for Wheezing or Shortness of Breath.   ? allopurinoL (ZYLOPRIM) 300 mg tablet Take one tablet by mouth daily. Take with food.   ? AMITR/GABAPEN/EMU OIL 05/24/08% CREAM (COMPOUND) Apply 5 grams (10 pumps) topically to affected area three times daily.   ? Ammonium Lactate-Emu Oil (EMU-LAC) 10 % crea Apply  topically to affected area. Apply to feet    ? ascorbic acid (VITAMIN C) 500 mg tablet Take one tablet by mouth daily.   ? aspirin EC 81 mg tablet Take one tablet by mouth daily. Take with food.   ? atorvastatin (LIPITOR) 40 mg tablet TAKE (1) TABLET BY MOUTH ONCE DAILY   ? azithromycin (ZITHROMAX) 250 mg tablet Z-PAK: Take 2 tabs by mouth on day 1, followed by 1 tab by mouth daily on days 2-5.   ? benzonatate (TESSALON PERLES) 100 mg capsule Take two capsules by mouth every 8 hours as needed for Cough.   ? budesonide/formoterol (SYMBICORT HFA) 160/4.5 mcg inhalation Inhale two puffs by mouth into the lungs  twice daily. Rinse mouth after use.   ? bumetanide (BUMEX) 2 mg tablet Take one tablet by mouth daily. Take extra dose for swelling   ? cholecalciferol (VITAMIN D-3) 1,000 units tablet Take 1,000 Units by mouth daily.   ? docusate (COLACE) 100 mg capsule Take 100 mg by mouth daily as needed for Constipation.   ? doxycycline (MONODOX) 100 mg capsule Take one capsule by mouth daily.   ? duloxetine DR (CYMBALTA) 60 mg capsule Take one capsule by mouth daily.   ? finasteride (PROSCAR) 5 mg tablet Take one tablet by mouth daily.   ? fish oil- omega 3-DHA/EPA 300/1,000 mg capsule Take 1 capsule by mouth daily.   ? HYDROcodone/acetaminophen (NORCO) 5/325 mg tablet Take one tablet by mouth every 4 hours as needed for Pain   ? ipratropium bromide (ATROVENT) 42 mcg (0.06 %) nasal spray 1 spray to each nostril Q6H PRN   ? midodrine (PROAMATINE) 5 mg tablet Take one tablet by mouth twice daily. Take AM dose first thing in AM, and second dose at 2-3 PM.   ? milk of magnesia (CONC) 2,400 mg/10 mL oral suspension Take  by mouth daily as needed for Heartburn.   ? montelukast (SINGULAIR) 10 mg tablet Take one tablet by mouth at bedtime daily.   ? nitroglycerin (NITROSTAT) 0.4 mg tablet Place 0.4 mg under tongue every 5 minutes as needed for Chest Pain. Max of 3 tablets, call 911.   ? pantoprazole DR (PROTONIX) 40 mg tablet TAKE (1) TABLET BY MOUTH TWO TIMES A DAY.   ? polyethylene glycol 3350 (MIRALAX) 17 g packet Take one packet by mouth as Needed.   ? potassium chloride (MICRO-K) 10 mEq capsule Take one capsule by mouth daily. Take with a meal and a full glass of water.   ? sodium chloride 3 % nebulizer solution Inhale 4 mL by mouth into the lungs twice daily. Use with [place].  Indications: asthma, chronic cough, bronchiectasis   ? spironolactone (ALDACTONE) 25 mg tablet Take one tablet by mouth twice daily. Take with food.   ? tamsulosin (FLOMAX) 0.4 mg capsule Take 1 capsule by mouth daily.   ? traMADol (ULTRAM) 50 mg tablet Take one tablet by mouth every 8 hours as needed for Pain.   ? zinc oxide 20 % topical ointment Place twice a day on the buttock wound     Vitals:    12/04/18 1116   Temp: (!) 35.6 ?C (96 ?F)       Body mass index is 40.59 kg/m?[name]     Wt Readings from Last 5 Encounters:   10/01/18 132 kg (291 lb)   09/11/18 132 kg (291 lb)   08/05/18 136.1 kg (300 lb)   07/23/18 136.1 kg (300 lb)   06/18/18 131.5 kg (290 lb)       Physical Exam  Constitutional:       Appearance: He is ill-appearing.   HENT:      Head: Normocephalic and atraumatic.   Pulmonary:      Effort: Pulmonary effort is normal.      Comments: Coughing, using some mild accessory muscles (sternocleidomastoid).  Tachypnea. Neurological:      General: No focal deficit present.      Mental Status: He is alert.   Psychiatric:         Mood and Affect: Mood normal.       Labs Reviewed:   Lab Results   Component Value Date/Time    HGBA1C 6.7 (H)  08/12/2018    HGBA1C 6.8 (H) 04/03/2017 04:39 AM    HGBA1C 5.4 02/25/2016 11:50 AM    HGBPOC 11.2 (L) 10/25/2012 10:58 AM    HGBPOC 12.9 (L) 10/25/2012 08:55 AM    TSH 2.160 03/01/2015 02:18 PM    FREET4R 0.99 12/15/2014 09:12 AM    CHOL 93 08/12/2018    TRIG 187 (H) 08/12/2018    HDL 38 (L) 08/12/2018    LDL 17.6 08/12/2018    NA 161 08/29/2018    K 44 08/29/2018    CL 103 08/29/2018    CO2 25.2 08/29/2018    GAP 11 08/05/2018 11:31 AM    BUN 37.0 (H) 08/29/2018    CR 1.6 (H) 08/29/2018    GLU 124 (H) 08/29/2018    CA 9.2 08/29/2018    PO4 2.7 11/19/2017 04:58 AM    ALBUMIN 3.2 (L) 03/04/2018 04:59 AM    TOTPROT 5.4 (L) 03/04/2018 04:59 AM    ALKPHOS 78 03/04/2018 04:59 AM    AST 13 03/04/2018 04:59 AM    ALT 12 03/04/2018 04:59 AM    TOTBILI 0.6 03/04/2018 04:59 AM    GFR 45 08/29/2018    GFRAA >60 08/05/2018 11:31 AM    PSA 2.96 11/25/2018         Assessment and Plan:    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 Bronchiectasis without complication (HCC)    3. Chronic cough      Ed is here being seen with what I think is a flare of his COPD/bronchiectasis.  This is been an ongoing flare for about the past 3 weeks.  He has not responded to his typical therapies of azithromycin and prednisone.  He had another dose of levofloxacin with prednisone, but despite this he is not getting better, if anything getting a little bit worse.  I am going to admit him as an inpatient for further work-up and evaluation.  He will need to go on to the COVID-19 unit given that we need to rule that out.  I think this is less likely overall, but I do think it is an important thing to take off the table initially.  This does not seem to be heart failure related.  He has a CardioMEMS in place and his readings have been stable.    RTC: 1 week post discharge    I spent over 40 minutes in direct care, and over half of the visit was in counseling and coordination of care.      There are no Patient Instructions on file for this visit.    No follow-ups on file.

## 2018-12-05 ENCOUNTER — Encounter: Admit: 2018-12-05 | Discharge: 2018-12-05 | Payer: MEDICARE

## 2018-12-05 DIAGNOSIS — J449 Chronic obstructive pulmonary disease, unspecified: Secondary | ICD-10-CM

## 2018-12-05 DIAGNOSIS — I714 Abdominal aortic aneurysm, without rupture: Secondary | ICD-10-CM

## 2018-12-05 DIAGNOSIS — N401 Enlarged prostate with lower urinary tract symptoms: Secondary | ICD-10-CM

## 2018-12-05 DIAGNOSIS — E782 Mixed hyperlipidemia: Secondary | ICD-10-CM

## 2018-12-05 DIAGNOSIS — Z95828 Presence of other vascular implants and grafts: Secondary | ICD-10-CM

## 2018-12-05 DIAGNOSIS — R609 Edema, unspecified: Secondary | ICD-10-CM

## 2018-12-05 DIAGNOSIS — Z9981 Dependence on supplemental oxygen: Secondary | ICD-10-CM

## 2018-12-05 DIAGNOSIS — G4733 Obstructive sleep apnea (adult) (pediatric): Secondary | ICD-10-CM

## 2018-12-05 DIAGNOSIS — I509 Heart failure, unspecified: Secondary | ICD-10-CM

## 2018-12-05 DIAGNOSIS — J45909 Unspecified asthma, uncomplicated: Secondary | ICD-10-CM

## 2018-12-05 DIAGNOSIS — Z8614 Personal history of Methicillin resistant Staphylococcus aureus infection: Secondary | ICD-10-CM

## 2018-12-05 DIAGNOSIS — L03116 Cellulitis of left lower limb: Secondary | ICD-10-CM

## 2018-12-05 DIAGNOSIS — M954 Acquired deformity of chest and rib: Secondary | ICD-10-CM

## 2018-12-05 DIAGNOSIS — Z8679 Personal history of other diseases of the circulatory system: Secondary | ICD-10-CM

## 2018-12-05 DIAGNOSIS — I2510 Atherosclerotic heart disease of native coronary artery without angina pectoris: Secondary | ICD-10-CM

## 2018-12-05 DIAGNOSIS — R05 Cough: Secondary | ICD-10-CM

## 2018-12-05 DIAGNOSIS — I429 Cardiomyopathy, unspecified: Secondary | ICD-10-CM

## 2018-12-05 DIAGNOSIS — J189 Pneumonia, unspecified organism: Secondary | ICD-10-CM

## 2018-12-05 DIAGNOSIS — K219 Gastro-esophageal reflux disease without esophagitis: Secondary | ICD-10-CM

## 2018-12-05 DIAGNOSIS — J302 Other seasonal allergic rhinitis: Secondary | ICD-10-CM

## 2018-12-05 DIAGNOSIS — I10 Essential (primary) hypertension: Secondary | ICD-10-CM

## 2018-12-05 DIAGNOSIS — R0600 Dyspnea, unspecified: Secondary | ICD-10-CM

## 2018-12-05 DIAGNOSIS — J479 Bronchiectasis, uncomplicated: Secondary | ICD-10-CM

## 2018-12-05 DIAGNOSIS — N183 CKD (chronic kidney disease) stage 3, GFR 30-59 ml/min: Secondary | ICD-10-CM

## 2018-12-05 DIAGNOSIS — M961 Postlaminectomy syndrome, not elsewhere classified: Secondary | ICD-10-CM

## 2018-12-05 DIAGNOSIS — I5042 Chronic combined systolic (congestive) and diastolic (congestive) heart failure: Secondary | ICD-10-CM

## 2018-12-05 LAB — URINALYSIS DIPSTICK
Lab: NEGATIVE K/UL — ABNORMAL HIGH (ref 3–12)
Lab: NEGATIVE mL/min — ABNORMAL LOW (ref 0–0.20)
Lab: NEGATIVE mL/min — ABNORMAL LOW (ref 0–0.45)
Lab: NEGATIVE
Lab: NEGATIVE
Lab: NEGATIVE
Lab: POSITIVE — AB

## 2018-12-05 LAB — BASIC METABOLIC PANEL
Lab: 132 MMOL/L — ABNORMAL LOW (ref 137–147)
Lab: 3.7 MMOL/L (ref 3.5–5.1)

## 2018-12-05 LAB — FIBRINOGEN: Lab: 657 mg/dL — ABNORMAL HIGH (ref 200–400)

## 2018-12-05 LAB — PROCALCITONIN: Lab: 0.3 ng/mL

## 2018-12-05 LAB — D-DIMER: Lab: 908 ng{FEU}/mL — ABNORMAL HIGH (ref <500)

## 2018-12-05 LAB — CBC AND DIFF
Lab: 13 g/dL (ref 13.5–16.5)
Lab: 3.8 M/UL — ABNORMAL LOW (ref 4.4–5.5)
Lab: 5.3 10*3/uL (ref 4.5–11.0)
Lab: 0 % (ref 0–2)
Lab: 0 % (ref 0–5)
Lab: 0 10*3/uL (ref 0–0.20)
Lab: 0.6 10*3/uL — ABNORMAL LOW (ref 1.0–4.8)
Lab: 14 % — ABNORMAL HIGH (ref 4–12)
Lab: 15 % — ABNORMAL HIGH (ref >60)
Lab: 15 % — ABNORMAL LOW (ref 24–44)
Lab: 171 10*3/uL — ABNORMAL LOW (ref >60)
Lab: 3.2 10*3/uL (ref 1.8–7.0)
Lab: 3.7 M/UL — ABNORMAL LOW (ref 4.4–5.5)
Lab: 35 pg — ABNORMAL HIGH (ref 26–34)
Lab: 38 % — ABNORMAL LOW (ref 40–50)
Lab: 4.5 10*3/uL — ABNORMAL LOW (ref 4.5–11.0)
Lab: 7.8 FL (ref 7–11)
Lab: 71 % (ref 41–77)

## 2018-12-05 LAB — COMPREHENSIVE METABOLIC PANEL: Lab: 132 MMOL/L — ABNORMAL LOW (ref 137–147)

## 2018-12-05 LAB — STREPTOCOCCUS PNEUMO AG, URINE

## 2018-12-05 LAB — POC GLUCOSE
Lab: 134 mg/dL — ABNORMAL HIGH (ref 70–100)
Lab: 209 mg/dL — ABNORMAL HIGH (ref 70–100)

## 2018-12-05 LAB — URINALYSIS, MICROSCOPIC

## 2018-12-05 LAB — LEGIONELLA ANTIGEN URINE,RAN

## 2018-12-05 LAB — LACTIC ACID(LACTATE)
Lab: 2.1 MMOL/L — ABNORMAL HIGH (ref 0.5–2.0)
Lab: 0.9 MMOL/L — ABNORMAL LOW (ref 0.5–2.0)

## 2018-12-05 LAB — LIPID PROFILE: Lab: 106 mg/dL — ABNORMAL HIGH (ref <200)

## 2018-12-05 LAB — TROPONIN-I
Lab: 0 ng/mL — ABNORMAL HIGH (ref >40)
Lab: 0 ng/mL — ABNORMAL HIGH (ref 0.0–0.05)
Lab: 0 ng/mL (ref 0.0–0.05)

## 2018-12-05 LAB — GRAM STAIN

## 2018-12-05 LAB — PTT (APTT): Lab: 29 s (ref 24.0–36.5)

## 2018-12-05 LAB — PROTIME INR (PT): Lab: 1.1 (ref 0.8–1.2)

## 2018-12-05 LAB — HEMOGLOBIN A1C: Lab: 6.3 % — ABNORMAL HIGH (ref 4.0–6.0)

## 2018-12-05 LAB — COVID-19 (SARS-COV-2) PCR: Lab: DETECTED MMOL/L — AB (ref 20–28)

## 2018-12-05 LAB — PHOSPHORUS: Lab: 3.6 mg/dL — ABNORMAL LOW (ref 2.0–4.5)

## 2018-12-05 LAB — BNP (B-TYPE NATRIURETIC PEPTI): Lab: 108 pg/mL — ABNORMAL HIGH (ref <150)

## 2018-12-05 LAB — C REACTIVE PROTEIN (CRP): Lab: 6 mg/dL — ABNORMAL HIGH (ref <1.0)

## 2018-12-05 LAB — LDH-LACTATE DEHYDROGENASE: Lab: 368 U/L — ABNORMAL HIGH (ref 100–210)

## 2018-12-05 LAB — MAGNESIUM: Lab: 1.7 mg/dL — ABNORMAL HIGH (ref 1.6–2.6)

## 2018-12-05 MED ORDER — DOXYCYCLINE HYCLATE 100 MG PO TAB
100 mg | Freq: Two times a day (BID) | ORAL | 0 refills | Status: DC
Start: 2018-12-05 — End: 2018-12-15
  Administered 2018-12-06 – 2018-12-15 (×20): 100 mg via ORAL

## 2018-12-05 MED ORDER — BUMETANIDE 0.25 MG/ML IJ SOLN
1 mg | Freq: Once | INTRAVENOUS | 0 refills | Status: CP
Start: 2018-12-05 — End: ?
  Administered 2018-12-05: 22:00:00 1 mg via INTRAVENOUS

## 2018-12-05 MED ORDER — DEXAMETHASONE SODIUM PHOSPHATE 10 MG/ML IJ SOLN
6 mg | Freq: Every day | INTRAVENOUS | 0 refills | Status: CP
Start: 2018-12-05 — End: ?
  Administered 2018-12-05 – 2018-12-07 (×3): 6 mg via INTRAVENOUS

## 2018-12-05 MED ORDER — REMDESIVIR IVPB
100 mg | Freq: Every day | INTRAVENOUS | 0 refills | Status: CP
Start: 2018-12-05 — End: ?
  Administered 2018-12-06 – 2018-12-09 (×8): 100 mg via INTRAVENOUS

## 2018-12-05 MED ORDER — REMDESIVIR IVPB
200 mg | Freq: Once | INTRAVENOUS | 0 refills | Status: CP
Start: 2018-12-05 — End: ?
  Administered 2018-12-05 (×2): 200 mg via INTRAVENOUS

## 2018-12-05 MED ORDER — ENOXAPARIN 40 MG/0.4 ML SC SYRG
40 mg | Freq: Two times a day (BID) | SUBCUTANEOUS | 0 refills | Status: DC
Start: 2018-12-05 — End: 2018-12-15
  Administered 2018-12-06 – 2018-12-15 (×20): 40 mg via SUBCUTANEOUS

## 2018-12-05 MED ORDER — ALLOPURINOL 100 MG PO TAB
200 mg | Freq: Every day | ORAL | 0 refills | Status: DC
Start: 2018-12-05 — End: 2018-12-15
  Administered 2018-12-05 – 2018-12-15 (×11): 200 mg via ORAL

## 2018-12-05 MED ORDER — INSULIN ASPART 100 UNIT/ML SC FLEXPEN
0-6 [IU] | Freq: Before meals | SUBCUTANEOUS | 0 refills | Status: DC
Start: 2018-12-05 — End: 2018-12-15
  Administered 2018-12-05: 23:00:00 1 [IU] via SUBCUTANEOUS

## 2018-12-05 MED ORDER — BUMETANIDE 1 MG PO TAB
1 mg | Freq: Two times a day (BID) | ORAL | 0 refills | Status: DC
Start: 2018-12-05 — End: 2018-12-09
  Administered 2018-12-05 – 2018-12-09 (×9): 1 mg via ORAL

## 2018-12-05 NOTE — Progress Notes
Patient arrived to room # (1501) via wheelchair accompanied by [place]. Patient transferred to the bed with assistance. Bedside safety checks completed. Initial patient assessment completed. Refer to flowsheet for details.    Admission skin assessment completed with: Lana Fish RN and Dustin Flock RN     Pressure injury present on arrival?: No    1. Head/Face/Neck: No  2. Trunk/Back: No  3. Upper Extremities: No  4. Lower Extremities: No  5. Pelvic/Coccyx: No  6. Assessed for device associated injury? Yes  7. Malnutrition Screening Tool (Nursing Nutrition Assessment) Completed? Yes    See Doc Flowsheet for additional wound details.     INTERVENTIONS:     Patient arrived to the unit via wheelchair in stable condition. Alert and oriented x4. Assessment completed and charted as noted. Patient is short of air with ambulation requiring O2 at 2.5L to keep sats above 90%. AOD paged. Labs collected and COVID swab sent to lab. Breath sounds diminished in bilateral lower lobes with fine crackled noted in bilateral upper lobes. Patient ID band verified with patient. Call light within reach. Will cont plan of cares.

## 2018-12-06 ENCOUNTER — Encounter: Admit: 2018-12-06 | Discharge: 2018-12-06 | Payer: MEDICARE

## 2018-12-06 DIAGNOSIS — Z95810 Presence of automatic (implantable) cardiac defibrillator: Secondary | ICD-10-CM

## 2018-12-06 LAB — BASIC METABOLIC PANEL: Lab: 132 MMOL/L — ABNORMAL LOW (ref >60)

## 2018-12-06 LAB — FERRITIN: Lab: 299 ng/mL — ABNORMAL LOW (ref 30–300)

## 2018-12-06 LAB — PROTIME INR (PT): Lab: 1 mg/dL — ABNORMAL HIGH (ref 0.8–1.2)

## 2018-12-06 LAB — POC GLUCOSE
Lab: 166 mg/dL — ABNORMAL HIGH (ref 70–100)
Lab: 130 mg/dL — ABNORMAL HIGH (ref 70–100)
Lab: 139 mg/dL — ABNORMAL HIGH (ref 70–100)
Lab: 154 mg/dL — ABNORMAL HIGH (ref 70–100)

## 2018-12-06 LAB — D-DIMER: Lab: 106 ng{FEU}/mL — ABNORMAL HIGH (ref <500)

## 2018-12-06 LAB — PTT (APTT): Lab: 29 s — ABNORMAL LOW (ref >60)

## 2018-12-06 LAB — C REACTIVE PROTEIN (CRP): Lab: 7.3 mg/dL — ABNORMAL HIGH (ref <1.0)

## 2018-12-06 LAB — MAGNESIUM: Lab: 1.9 mg/dL — ABNORMAL HIGH (ref >60)

## 2018-12-06 LAB — CBC AND DIFF: Lab: 3.9 10*3/uL — ABNORMAL LOW (ref >60)

## 2018-12-06 MED ORDER — IMS MIXTURE TEMPLATE
400 mg | Freq: Three times a day (TID) | ORAL | 0 refills | Status: DC
Start: 2018-12-06 — End: 2018-12-15
  Administered 2018-12-06 – 2018-12-15 (×49): 400 mg via ORAL

## 2018-12-06 MED ORDER — AMITRIPTYLINE(#)/GABAPENTIN/EMU OIL 4/4/10%IN HRT TOPICAL CRM
TOPICAL | 0 refills | Status: DC
Start: 2018-12-06 — End: 2018-12-15
  Administered 2018-12-06 – 2018-12-14 (×3): via TOPICAL

## 2018-12-06 MED ORDER — BUMETANIDE 0.25 MG/ML IJ SOLN
2 mg | Freq: Once | INTRAVENOUS | 0 refills | Status: CP
Start: 2018-12-06 — End: ?
  Administered 2018-12-06: 18:00:00 2 mg via INTRAVENOUS

## 2018-12-06 MED ORDER — POTASSIUM CHLORIDE 20 MEQ PO TBTQ
40 meq | Freq: Once | ORAL | 0 refills | Status: CP
Start: 2018-12-06 — End: ?
  Administered 2018-12-06: 14:00:00 40 meq via ORAL

## 2018-12-06 MED ORDER — ZOLPIDEM 5 MG PO TAB
5 mg | Freq: Every evening | ORAL | 0 refills | Status: CP
Start: 2018-12-06 — End: ?
  Administered 2018-12-07: 02:00:00 5 mg via ORAL

## 2018-12-07 LAB — BASIC METABOLIC PANEL
Lab: 139 MMOL/L — ABNORMAL LOW (ref >60)
Lab: 4 MMOL/L — ABNORMAL LOW (ref >60)

## 2018-12-07 LAB — CBC AND DIFF: Lab: 3.3 K/UL — ABNORMAL LOW (ref 4.5–11.0)

## 2018-12-07 LAB — PROTIME INR (PT): Lab: 1 MMOL/L — ABNORMAL LOW (ref 0.8–1.2)

## 2018-12-07 LAB — PTT (APTT): Lab: 24 s — ABNORMAL LOW (ref >60)

## 2018-12-07 LAB — POC GLUCOSE
Lab: 155 mg/dL — ABNORMAL HIGH (ref 70–100)
Lab: 120 mg/dL — ABNORMAL HIGH (ref 70–100)
Lab: 117 mg/dL — ABNORMAL HIGH (ref 70–100)
Lab: 164 mg/dL — ABNORMAL HIGH (ref 70–100)

## 2018-12-07 LAB — D-DIMER: Lab: 808 ng{FEU}/mL — ABNORMAL HIGH (ref <500)

## 2018-12-07 LAB — CULTURE-RESP,LOWER W/SENSITIVITY: Lab: LOW

## 2018-12-07 LAB — C REACTIVE PROTEIN (CRP): Lab: 4 mg/dL — ABNORMAL HIGH (ref <1.0)

## 2018-12-07 LAB — FERRITIN: Lab: 333 ng/mL — ABNORMAL HIGH (ref 30–300)

## 2018-12-07 LAB — MAGNESIUM: Lab: 1.9 mg/dL — ABNORMAL LOW (ref >60)

## 2018-12-07 MED ORDER — DEXAMETHASONE SODIUM PHOSPHATE 10 MG/ML IJ SOLN
6 mg | Freq: Every day | INTRAVENOUS | 0 refills | Status: DC
Start: 2018-12-07 — End: 2018-12-09
  Administered 2018-12-08: 18:00:00 6 mg via INTRAVENOUS

## 2018-12-07 MED ORDER — BUMETANIDE 0.25 MG/ML IJ SOLN
2 mg | Freq: Once | INTRAVENOUS | 0 refills | Status: CP
Start: 2018-12-07 — End: ?
  Administered 2018-12-07: 19:00:00 2 mg via INTRAVENOUS

## 2018-12-07 MED ORDER — ZOLPIDEM 5 MG PO TAB
5 mg | Freq: Once | ORAL | 0 refills | Status: CP
Start: 2018-12-07 — End: ?
  Administered 2018-12-08: 05:00:00 5 mg via ORAL

## 2018-12-08 LAB — POC GLUCOSE
Lab: 157 mg/dL — ABNORMAL HIGH (ref 70–100)
Lab: 80 mg/dL (ref 70–100)
Lab: 170 mg/dL — ABNORMAL HIGH (ref 70–100)

## 2018-12-08 MED ORDER — ZOLPIDEM 5 MG PO TAB
5 mg | Freq: Every evening | ORAL | 0 refills | Status: DC | PRN
Start: 2018-12-08 — End: 2018-12-15
  Administered 2018-12-09 – 2018-12-15 (×7): 5 mg via ORAL

## 2018-12-09 ENCOUNTER — Inpatient Hospital Stay: Admit: 2018-12-09 | Discharge: 2018-12-09 | Payer: MEDICARE

## 2018-12-09 ENCOUNTER — Encounter: Admit: 2018-12-09 | Discharge: 2018-12-09 | Payer: MEDICARE

## 2018-12-09 LAB — MAGNESIUM: Lab: 1.7 mg/dL — ABNORMAL LOW (ref >60)

## 2018-12-09 LAB — CBC AND DIFF
Lab: 0 % (ref 0–2)
Lab: 0 % (ref 0–5)
Lab: 14 % (ref 11–15)
Lab: 190 10*3/uL (ref 150–400)
Lab: 3.5 M/UL — ABNORMAL LOW (ref >60)
Lab: 35 pg — ABNORMAL HIGH (ref 26–34)
Lab: 36 % — ABNORMAL LOW (ref 40–50)
Lab: 5.1 10*3/uL (ref 4.5–11.0)
Lab: 7.9 FL (ref 7–11)
Lab: 8 % (ref 4–12)
Lab: 8 % — ABNORMAL LOW (ref 24–44)
Lab: 84 % — ABNORMAL HIGH (ref 41–77)

## 2018-12-09 LAB — BASIC METABOLIC PANEL
Lab: 1.2 mg/dL (ref 0.4–1.24)
Lab: 113 mg/dL — ABNORMAL HIGH (ref 70–100)
Lab: 139 MMOL/L (ref 137–147)
Lab: 28 MMOL/L (ref 21–30)
Lab: 3.8 MMOL/L (ref 3.5–5.1)

## 2018-12-09 LAB — D-DIMER: Lab: 933 ng{FEU}/mL — ABNORMAL HIGH (ref <500)

## 2018-12-09 LAB — POC GLUCOSE
Lab: 106 mg/dL — ABNORMAL HIGH (ref 70–100)
Lab: 111 mg/dL — ABNORMAL HIGH (ref 70–100)
Lab: 154 mg/dL — ABNORMAL HIGH (ref 70–100)
Lab: 191 mg/dL — ABNORMAL HIGH (ref 70–100)
Lab: 86 mg/dL (ref 70–100)

## 2018-12-09 LAB — FERRITIN: Lab: 321 ng/mL — ABNORMAL HIGH (ref 30–300)

## 2018-12-09 LAB — C REACTIVE PROTEIN (CRP): Lab: 6.4 mg/dL — ABNORMAL HIGH (ref <1.0)

## 2018-12-09 LAB — PTT (APTT): Lab: 27 s (ref 24.0–36.5)

## 2018-12-09 LAB — PROTIME INR (PT): Lab: 1 FL — ABNORMAL HIGH (ref 0.8–1.2)

## 2018-12-09 LAB — BNP (B-TYPE NATRIURETIC PEPTI): Lab: 191 pg/mL — ABNORMAL HIGH (ref 0–100)

## 2018-12-09 MED ORDER — DEXAMETHASONE 2 MG PO TAB
6 mg | Freq: Every day | ORAL | 0 refills | Status: DC
Start: 2018-12-09 — End: 2018-12-14
  Administered 2018-12-09 – 2018-12-14 (×6): 6 mg via ORAL

## 2018-12-09 MED ORDER — BUMETANIDE 2 MG PO TAB
2 mg | Freq: Two times a day (BID) | ORAL | 0 refills | Status: DC
Start: 2018-12-09 — End: 2018-12-12
  Administered 2018-12-09 – 2018-12-12 (×6): 2 mg via ORAL

## 2018-12-10 LAB — PROTIME INR (PT): Lab: 1 MMOL/L — ABNORMAL LOW (ref 0.8–1.2)

## 2018-12-10 LAB — MAGNESIUM: Lab: 1.7 mg/dL — ABNORMAL LOW (ref 1.6–2.6)

## 2018-12-10 LAB — POC GLUCOSE
Lab: 162 mg/dL — ABNORMAL HIGH (ref 70–100)
Lab: 118 mg/dL — ABNORMAL HIGH (ref 70–100)
Lab: 126 mg/dL — ABNORMAL HIGH (ref 70–100)
Lab: 157 mg/dL — ABNORMAL HIGH (ref 70–100)

## 2018-12-10 LAB — C REACTIVE PROTEIN (CRP): Lab: 8.2 mg/dL — ABNORMAL HIGH (ref <1.0)

## 2018-12-10 LAB — CULTURE-BLOOD W/SENSITIVITY

## 2018-12-10 LAB — BASIC METABOLIC PANEL: Lab: 137 MMOL/L — ABNORMAL LOW (ref 137–147)

## 2018-12-10 LAB — FERRITIN: Lab: 311 ng/mL — ABNORMAL HIGH (ref 30–300)

## 2018-12-10 LAB — PTT (APTT): Lab: 29 s — ABNORMAL LOW (ref 24.0–36.5)

## 2018-12-10 LAB — CBC AND DIFF: Lab: 5.2 K/UL — ABNORMAL LOW (ref 4.5–11.0)

## 2018-12-10 LAB — D-DIMER: Lab: 843 ng{FEU}/mL — ABNORMAL HIGH (ref <500)

## 2018-12-10 MED ORDER — BISACODYL 5 MG PO TBEC
5 mg | Freq: Every day | ORAL | 0 refills | Status: DC
Start: 2018-12-10 — End: 2018-12-15
  Administered 2018-12-10 – 2018-12-15 (×6): 5 mg via ORAL

## 2018-12-11 LAB — POC GLUCOSE
Lab: 208 mg/dL — ABNORMAL HIGH (ref 70–100)
Lab: 132 mg/dL — ABNORMAL HIGH (ref 70–100)
Lab: 140 mg/dL — ABNORMAL HIGH (ref 70–100)
Lab: 233 mg/dL — ABNORMAL HIGH (ref 70–100)

## 2018-12-11 LAB — BASIC METABOLIC PANEL: Lab: 136 MMOL/L — ABNORMAL LOW (ref 137–147)

## 2018-12-11 LAB — CULTURE-BLOOD W/SENSITIVITY

## 2018-12-11 LAB — PTT (APTT): Lab: 28 s — ABNORMAL LOW (ref >60)

## 2018-12-11 LAB — D-DIMER: Lab: 105 ng{FEU}/mL — ABNORMAL HIGH (ref <500)

## 2018-12-11 LAB — CBC AND DIFF
Lab: 3.7 M/UL — ABNORMAL LOW (ref 4.4–5.5)
Lab: 7.4 10*3/uL — ABNORMAL LOW (ref >60)

## 2018-12-11 LAB — PROTIME INR (PT): Lab: 1 MMOL/L — ABNORMAL LOW (ref >60)

## 2018-12-11 LAB — MAGNESIUM: Lab: 1.6 mg/dL — ABNORMAL LOW (ref 1.6–2.6)

## 2018-12-11 MED ORDER — BUMETANIDE 2 MG PO TAB
2 mg | Freq: Once | ORAL | 0 refills | Status: CP
Start: 2018-12-11 — End: ?
  Administered 2018-12-11: 22:00:00 2 mg via ORAL

## 2018-12-12 ENCOUNTER — Inpatient Hospital Stay: Admit: 2018-12-12 | Discharge: 2018-12-12 | Payer: MEDICARE

## 2018-12-12 LAB — CBC AND DIFF
Lab: 102 FL — ABNORMAL HIGH (ref 80–100)
Lab: 38 % — ABNORMAL LOW (ref 40–50)
Lab: 7 K/UL — ABNORMAL LOW (ref 4.5–11.0)

## 2018-12-12 LAB — MAGNESIUM: Lab: 1.7 mg/dL — ABNORMAL LOW (ref 1.6–2.6)

## 2018-12-12 LAB — PTT (APTT): Lab: 29 s — ABNORMAL LOW (ref >60)

## 2018-12-12 LAB — POC GLUCOSE
Lab: 204 mg/dL — ABNORMAL HIGH (ref 70–100)
Lab: 110 mg/dL — ABNORMAL HIGH (ref 70–100)
Lab: 188 mg/dL — ABNORMAL HIGH (ref 70–100)
Lab: 171 mg/dL — ABNORMAL HIGH (ref 70–100)

## 2018-12-12 LAB — D-DIMER: Lab: 996 ng{FEU}/mL — ABNORMAL HIGH (ref <500)

## 2018-12-12 LAB — BASIC METABOLIC PANEL: Lab: 138 MMOL/L — ABNORMAL HIGH (ref 137–147)

## 2018-12-12 LAB — PROTIME INR (PT): Lab: 1 M/UL — ABNORMAL LOW (ref 0.8–1.2)

## 2018-12-12 MED ORDER — BUMETANIDE 2 MG PO TAB
2 mg | Freq: Two times a day (BID) | ORAL | 0 refills | Status: DC
Start: 2018-12-12 — End: 2018-12-15
  Administered 2018-12-13 – 2018-12-15 (×5): 2 mg via ORAL

## 2018-12-12 MED ORDER — POLYETHYLENE GLYCOL 3350 17 GRAM PO PWPK
1 | Freq: Two times a day (BID) | ORAL | 0 refills | Status: DC
Start: 2018-12-12 — End: 2018-12-15
  Administered 2018-12-13 – 2018-12-15 (×6): 17 g via ORAL

## 2018-12-12 MED ORDER — BISACODYL 10 MG RE SUPP
10 mg | Freq: Every day | RECTAL | 0 refills | Status: DC | PRN
Start: 2018-12-12 — End: 2018-12-15
  Administered 2018-12-13: 19:00:00 10 mg via RECTAL

## 2018-12-12 MED ORDER — BUMETANIDE 0.25 MG/ML IJ SOLN
2 mg | Freq: Once | INTRAVENOUS | 0 refills | Status: CP
Start: 2018-12-12 — End: ?
  Administered 2018-12-12: 20:00:00 2 mg via INTRAVENOUS

## 2018-12-12 MED ORDER — SENNOSIDES-DOCUSATE SODIUM 8.6-50 MG PO TAB
1 | Freq: Two times a day (BID) | ORAL | 0 refills | Status: DC
Start: 2018-12-12 — End: 2018-12-15
  Administered 2018-12-13 – 2018-12-15 (×6): 1 via ORAL

## 2018-12-13 LAB — CBC AND DIFF
Lab: 102 FL — ABNORMAL HIGH (ref 80–100)
Lab: 14 % (ref 11–15)
Lab: 3.6 M/UL — ABNORMAL LOW (ref >60)
Lab: 34 g/dL — ABNORMAL HIGH (ref >60)
Lab: 35 pg — ABNORMAL HIGH (ref >60)
Lab: 37 % — ABNORMAL LOW (ref 40–50)
Lab: 6.8 K/UL — ABNORMAL LOW (ref >60)

## 2018-12-13 LAB — POC GLUCOSE
Lab: 241 mg/dL — ABNORMAL HIGH (ref 70–100)
Lab: 91 mg/dL (ref 70–100)
Lab: 169 mg/dL — ABNORMAL HIGH (ref 70–100)

## 2018-12-13 LAB — PROTIME INR (PT): Lab: 1 mg/dL — ABNORMAL HIGH (ref 0.8–1.2)

## 2018-12-13 LAB — BASIC METABOLIC PANEL: Lab: 135 MMOL/L — ABNORMAL LOW (ref 137–147)

## 2018-12-13 LAB — D-DIMER: Lab: 115 ng{FEU}/mL — ABNORMAL HIGH (ref <500)

## 2018-12-13 LAB — PTT (APTT): Lab: 29 s — ABNORMAL HIGH (ref 24.0–36.5)

## 2018-12-13 LAB — MAGNESIUM: Lab: 1.7 mg/dL — ABNORMAL LOW (ref >60)

## 2018-12-14 ENCOUNTER — Encounter: Admit: 2018-12-14 | Discharge: 2018-12-14 | Payer: MEDICARE

## 2018-12-14 ENCOUNTER — Inpatient Hospital Stay: Admit: 2018-12-14 | Discharge: 2018-12-14 | Payer: MEDICARE

## 2018-12-14 LAB — TSH WITH FREE T4 REFLEX: Lab: 0.5 uU/mL — ABNORMAL LOW (ref 0.35–5.00)

## 2018-12-14 LAB — D-DIMER: Lab: 128 ng{FEU}/mL — ABNORMAL HIGH (ref <500)

## 2018-12-14 LAB — PTT (APTT): Lab: 31 s — ABNORMAL HIGH (ref 24.0–36.5)

## 2018-12-14 LAB — POC GLUCOSE
Lab: 197 mg/dL — ABNORMAL HIGH (ref 70–100)
Lab: 160 mg/dL — ABNORMAL HIGH (ref 70–100)
Lab: 166 mg/dL — ABNORMAL HIGH (ref 70–100)
Lab: 182 mg/dL — ABNORMAL HIGH (ref 70–100)

## 2018-12-14 LAB — BASIC METABOLIC PANEL: Lab: 135 MMOL/L — ABNORMAL LOW (ref 137–147)

## 2018-12-14 LAB — PROTIME INR (PT): Lab: 1 % — ABNORMAL LOW (ref 0.8–1.2)

## 2018-12-14 LAB — CBC AND DIFF: Lab: 6.6 K/UL — ABNORMAL LOW (ref 4.5–11.0)

## 2018-12-14 LAB — MAGNESIUM: Lab: 2 mg/dL — ABNORMAL LOW (ref 1.6–2.6)

## 2018-12-14 MED ORDER — SODIUM CHLORIDE 0.9 % IJ SOLN
50 mL | Freq: Once | INTRAVENOUS | 0 refills | Status: CP
Start: 2018-12-14 — End: ?
  Administered 2018-12-14: 19:00:00 50 mL via INTRAVENOUS

## 2018-12-14 MED ORDER — IOHEXOL 350 MG IODINE/ML IV SOLN
80 mL | Freq: Once | INTRAVENOUS | 0 refills | Status: CP
Start: 2018-12-14 — End: ?
  Administered 2018-12-14: 19:00:00 80 mL via INTRAVENOUS

## 2018-12-15 LAB — POC GLUCOSE
Lab: 189 mg/dL — ABNORMAL HIGH (ref 70–100)
Lab: 136 mg/dL — ABNORMAL HIGH (ref 70–100)
Lab: 135 mg/dL — ABNORMAL HIGH (ref 70–100)

## 2018-12-15 LAB — PTT (APTT): Lab: 30 s — ABNORMAL HIGH (ref >60)

## 2018-12-15 LAB — CBC AND DIFF: Lab: 7.6 K/UL — ABNORMAL LOW (ref 4.5–11.0)

## 2018-12-15 LAB — D-DIMER: Lab: 140 ng{FEU}/mL — ABNORMAL HIGH (ref <500)

## 2018-12-15 LAB — BASIC METABOLIC PANEL: Lab: 132 MMOL/L — ABNORMAL LOW (ref 137–147)

## 2018-12-15 LAB — PROTIME INR (PT): Lab: 1 MMOL/L (ref >60)

## 2018-12-15 LAB — MAGNESIUM: Lab: 2 mg/dL — ABNORMAL LOW (ref >60)

## 2018-12-16 ENCOUNTER — Encounter: Admit: 2018-12-16 | Discharge: 2018-12-16 | Payer: MEDICARE

## 2018-12-16 NOTE — Telephone Encounter
Hospital Discharge Follow Up      Reached Patient:No, transferred to Skilled Nursing Facility, [facility]     Admission Information:     Hospital Name: [facility]  Admission Date: 12/04/2018  Discharge Date: 12/14/2018  Admission Diagnosis: Shortness of breath / cough  Discharge Diagnosis: Acute on chronic respiratory failure with hypoxia, Covid 19 infection   Has there been a discharge within the last 30 days? No  If yes, reason: N/A  Hospital Services: Unplanned  Today's call is 1(business) days post discharge     Medication Reconciliation      ? acetaminophen (TYLENOL) 325 mg tablet Take two tablets by mouth every 6 hours as needed for Pain.   ? albuterol 0.083% (PROVENTIL; VENTOLIN) 2.5 mg /3 mL (0.083 %) nebulizer solution Inhale 3 mL solution by nebulizer as directed every 6 hours as needed for Wheezing or Shortness of Breath. Indications: asthma   ? albuterol sulfate (PROAIR HFA) 90 mcg/actuation aerosol inhaler Inhale two puffs by mouth into the lungs every 4 hours as needed for Wheezing or Shortness of Breath.   ? allopurinoL (ZYLOPRIM) 300 mg tablet Take one tablet by mouth daily. Take with food.   ? AMITR/GABAPEN/EMU OIL 05/24/08% CREAM (COMPOUND) Apply 5 grams (10 pumps) topically to affected area three times daily. (Patient taking differently: Apply  topically to affected area three times daily as needed.)   ? Ammonium Lactate-Emu Oil (EMU-LAC) 10 % crea Apply  topically to affected area. Apply to feet    ? ascorbic acid (VITAMIN C) 500 mg tablet Take one tablet by mouth daily.   ? aspirin EC 81 mg tablet Take one tablet by mouth daily. Take with food.   ? atorvastatin (LIPITOR) 40 mg tablet TAKE (1) TABLET BY MOUTH ONCE DAILY   ? benzonatate (TESSALON PERLES) 100 mg capsule Take two capsules by mouth every 8 hours as needed for Cough.   ? budesonide/formoterol (SYMBICORT HFA) 160/4.5 mcg inhalation Inhale two puffs by mouth into the lungs twice daily. Rinse mouth after use. ? bumetanide (BUMEX) 2 mg tablet Take one tablet by mouth daily. Take extra dose for swelling   ? cholecalciferol (VITAMIN D-3) 1,000 units tablet Take 1,000 Units by mouth daily.   ? docusate (COLACE) 100 mg capsule Take 100 mg by mouth daily as needed for Constipation.   ? doxycycline (MONODOX) 100 mg capsule Take one capsule by mouth daily.   ? duloxetine DR (CYMBALTA) 60 mg capsule Take one capsule by mouth daily.   ? finasteride (PROSCAR) 5 mg tablet Take one tablet by mouth daily.   ? fish oil- omega 3-DHA/EPA 300/1,000 mg capsule Take 1 capsule by mouth daily.   ? HYDROcodone/acetaminophen (NORCO) 5/325 mg tablet Take one tablet by mouth every 4 hours as needed for Pain   ? ipratropium bromide (ATROVENT) 42 mcg (0.06 %) nasal spray 1 spray to each nostril Q6H PRN   ? montelukast (SINGULAIR) 10 mg tablet Take one tablet by mouth at bedtime daily.   ? nitroglycerin (NITROSTAT) 0.4 mg tablet Place 0.4 mg under tongue every 5 minutes as needed for Chest Pain. Max of 3 tablets, call 911.   ? pantoprazole DR (PROTONIX) 40 mg tablet TAKE (1) TABLET BY MOUTH TWO TIMES A DAY.   ? polyethylene glycol 3350 (MIRALAX) 17 g packet Take one packet by mouth as Needed.   ? sodium chloride 3 % nebulizer solution Inhale 4 mL by mouth into the lungs twice daily. Use with [place].  Indications:  asthma, chronic cough, bronchiectasis (Patient taking differently: Inhale 4 mL by mouth into the lungs twice daily as needed. Use with [place].  Indications: asthma, chronic cough, bronchiectasis)   ? spironolactone (ALDACTONE) 25 mg tablet Take one tablet by mouth twice daily. Take with food.   ? tamsulosin (FLOMAX) 0.4 mg capsule Take 1 capsule by mouth daily.   ? zinc oxide 20 % topical ointment Place twice a day on the buttock wound          Scheduling Follow-up Appointment   Upcoming appointment date and time and with whom scheduled:   Future Appointments   Date Time Provider Department Center 12/20/2018  1:00 PM Comfort, [name], MD [facility] IM   12/25/2018  1:30 PM Vena Austria, MD [facility] CVM Exam   12/25/2018  2:00 PM [place] PACEMAKER 2 [facility] CVM Procedur   12/26/2018 12:00 AM [facility] REMOTE MONITORING [facility] CVM Procedur   12/31/2018 11:00 AM Pearson, Shela Commons, APRN-NP [facility] CVM Exam   01/09/2019 10:40 AM Hyman Bower, DO [facility] IM   01/15/2019 11:30 AM CT-MO[facility] Radiolog   01/15/2019  1:00 PM Harley Alto, MD [facility] CVM Exam   01/27/2019 12:00 AM [facility] REMOTE MONITORING [facility] CVM Procedur   01/31/2019 11:30 AM Vanetta Shawl I, MD [facility] CVM Exam   02/27/2019 12:00 AM [facility] REMOTE MONITORING [facility] CVM Procedur   03/31/2019 12:00 AM [facility] REMOTE MONITORING [facility] CVM Procedur   05/01/2019 12:00 AM [facility] REMOTE MONITORING [facility] CVM Procedur   06/02/2019 12:00 AM MAC REMOTE MONITORING [facility] CVM Procedur   07/03/2019 12:00 AM [facility] REMOTE MONITORING [facility] CVM Procedur   08/04/2019 12:00 AM [facility] REMOTE MONITORING [facility] CVM Procedur   09/04/2019 12:00 AM MAC REMOTE MONITORING [facility] CVM Procedur   10/06/2019 12:00 AM MAC REMOTE MONITORING [facility] CVM Procedur   11/06/2019 12:00 AM MAC REMOTE MONITORING [facility] CVM Procedur   12/08/2019 12:00 AM MAC REMOTE MONITORING [facility] CVM Procedur   01/08/2020 12:00 AM MAC REMOTE MONITORING [facility] CVM Procedur   02/09/2020 12:00 AM MAC REMOTE MONITORING [facility] CVM Procedur   03/11/2020 12:00 AM MAC REMOTE MONITORING [facility]HR[facility] CVM Procedur   04/12/2020 12:00 AM MAC REMOTE MONITORING [facility] CVM Procedur   05/13/2020 12:00 AM MAC REMOTE MONITORING [facility] CVM Procedur   06/14/2020 12:00 AM MAC REMOTE MONITORING [facility] CVM Procedur     PCP appointment scheduled?Yes, Date: 12/20/2018   PCP primary location: [facility]  Specialist appointment scheduled? Yes, with Cardiology 12/25/2018, 12/31/2018, 01/15/2019, 01/31/2019 and Allergy 01/09/2019 . Both PCP and Specialist appointment scheduled: Yes  Maurilio Lovely, RN

## 2018-12-17 ENCOUNTER — Encounter: Admit: 2018-12-17 | Discharge: 2018-12-17 | Payer: MEDICARE

## 2018-12-17 NOTE — Telephone Encounter
Patient's wife states he's doing worse since he's been there and declining there.  She states he is at [facility] (across the street from [place]).    I let her know that Dr. Gibson Ramp is unable to discharge as that needs to be done by the rehab facility, but I would update him on the facility he's at.    Myrtie Hawk, RN

## 2018-12-17 NOTE — Telephone Encounter
Patient's wife called stating patient is fit to be tied being in the rehab facility.    She's asking if Dr. Gibson Ramp can get him released from the facility.    Routing to Dr. Gibson Ramp to advise  Myrtie Hawk, RN

## 2018-12-19 ENCOUNTER — Encounter: Admit: 2018-12-19 | Discharge: 2018-12-19 | Payer: MEDICARE

## 2018-12-19 NOTE — Telephone Encounter
Patient's wife called stating she finally got her husband home last night.      States the place he was at was horrible.  Stating they never gave him any fluid pills and he was left with "stench".      She is asking to change his appointment tomorrow to telehealth instead as traveling is very difficult right now for both of the.     I expressed my apologies for how they were feeling and let her know that we definitely could change his appointment to telehealth tomorrow.  She was very grateful.  Myrtie Hawk, RN

## 2018-12-19 NOTE — Progress Notes
History of Present Illness  Ruben Wood. is a 78 y.o. male with multiple medical problems including CAD, Systolic and Diastolic HF, HLD, OSA on CPAP, morbid obesity, chronic pain, and COPD who presents today for hospital discharge follow up.     Obtained patient's verbal consent to treat them and their agreement to [facility] financial policy and NPP via this telehealth visit during the [facility] Emergency    He was admitted directly from clinic with increased cough and SOB and found to have COVID 19.  He was admitted from 12/04/2018-12/15/2018.     Discharge Summary:   Patient is morbidly obese 78 y.o. 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  Acute on chronic hypoxic respiratory failure?- Baseline uses prn (very infrequently) w/exertion  COVID-19 infection?(+) on 10/14  Chronic bronchitis with bronchiectasis   - Has had cough, malaise and worsening shortness of breath over the past month however has worsened in particular over the past week. Pt has failed outpatient therapy which is included short course of azithromycin, Levaquin and prednisone. PTA pt has as needed home oxygen however has been using this continuously recently. 10/14 COVID-19 positive; sputum culture with normal oral flora, Streptococcus pneumonia and Legionella urine antigen negative; Procalcitonin 0.32. Chest x-ray redemonstrates bibasilar scarring/atelectasis, compared to prior imaging and no significant changes noted; no significant pulmonary edema and no consolidation noted. ID was consulted and he completed convalescent plasma therapy, remdisivir and 10 days of dexamethasone. His O2 requirements were trending down at the time of discharge to a SNF (was on 6L and titrated down to 1L before discharge). a CTA was obtained that was negative for PE but evidence of fibrosis related to covid pneumonitis vs fibrosis from old infection was present. Requested was made to follow up in pulmonary clinic at the time of discharge, where he has seen Dr. [name] previously.     Today, he reports that he has very little energy. He is having more difficulty with breathing.  His legs are as swollen as they ever have been.  His SaO2 on his home reading is showing 74% on 2L of O2 NC and a HR of 38.  He denies CP.  He denies lightheadedness/dizziness, just no energy. He reports that he was not getting his diuretic when he was in the rehab and he feels like he has a lot of fluid retention.  He is still coughing a lot, but no F/C.          Review of Systems   Constitutional: Positive for fatigue. Negative for chills and fever.   HENT: Positive for congestion. Negative for ear discharge, ear pain, rhinorrhea and sore throat.    Respiratory: Positive for cough and shortness of breath. Negative for chest tightness.    Cardiovascular: Positive for leg swelling. Negative for chest pain.   Gastrointestinal: Negative for diarrhea, nausea and vomiting.   Skin: Negative for wound.       Objective:         ? acetaminophen (TYLENOL) 325 mg tablet Take two tablets by mouth every 6 hours as needed for Pain.   ? albuterol 0.083% (PROVENTIL; VENTOLIN) 2.5 mg /3 mL (0.083 %) nebulizer solution Inhale 3 mL solution by nebulizer as directed every  6 hours as needed for Wheezing or Shortness of Breath. Indications: asthma   ? albuterol sulfate (PROAIR HFA) 90 mcg/actuation aerosol inhaler Inhale two puffs by mouth into the lungs every 4 hours as needed for Wheezing or Shortness of Breath.   ? allopurinoL (ZYLOPRIM) 300 mg tablet Take one tablet by mouth daily. Take with food.   ? AMITR/GABAPEN/EMU OIL 05/24/08% CREAM (COMPOUND) Apply 5 grams (10 pumps) topically to affected area three times daily. (Patient taking differently: Apply  topically to affected area three times daily as needed.)   ? Ammonium Lactate-Emu Oil (EMU-LAC) 10 % crea Apply  topically to affected area. Apply to feet    ? ascorbic acid (VITAMIN C) 500 mg tablet Take one tablet by mouth daily.   ? aspirin EC 81 mg tablet Take one tablet by mouth daily. Take with food.   ? atorvastatin (LIPITOR) 40 mg tablet TAKE (1) TABLET BY MOUTH ONCE DAILY   ? benzonatate (TESSALON PERLES) 100 mg capsule Take two capsules by mouth every 8 hours as needed for Cough.   ? budesonide/formoterol (SYMBICORT HFA) 160/4.5 mcg inhalation Inhale two puffs by mouth into the lungs twice daily. Rinse mouth after use.   ? bumetanide (BUMEX) 2 mg tablet Take one tablet by mouth daily. Take extra dose for swelling   ? cholecalciferol (VITAMIN D-3) 1,000 units tablet Take 1,000 Units by mouth daily.   ? docusate (COLACE) 100 mg capsule Take 100 mg by mouth daily as needed for Constipation.   ? doxycycline (MONODOX) 100 mg capsule Take one capsule by mouth daily.   ? duloxetine DR (CYMBALTA) 60 mg capsule Take one capsule by mouth daily.   ? finasteride (PROSCAR) 5 mg tablet Take one tablet by mouth daily.   ? fish oil- omega 3-DHA/EPA 300/1,000 mg capsule Take 1 capsule by mouth daily.   ? HYDROcodone/acetaminophen (NORCO) 5/325 mg tablet Take one tablet by mouth every 4 hours as needed for Pain   ? ipratropium bromide (ATROVENT) 42 mcg (0.06 %) nasal spray 1 spray to each nostril Q6H PRN   ? montelukast (SINGULAIR) 10 mg tablet Take one tablet by mouth at bedtime daily.   ? nitroglycerin (NITROSTAT) 0.4 mg tablet Place 0.4 mg under tongue every 5 minutes as needed for Chest Pain. Max of 3 tablets, call 911.   ? pantoprazole DR (PROTONIX) 40 mg tablet TAKE (1) TABLET BY MOUTH TWO TIMES A DAY.   ? polyethylene glycol 3350 (MIRALAX) 17 g packet Take one packet by mouth as Needed. ? sodium chloride 3 % nebulizer solution Inhale 4 mL by mouth into the lungs twice daily. Use with [place].  Indications: asthma, chronic cough, bronchiectasis (Patient taking differently: Inhale 4 mL by mouth into the lungs twice daily as needed. Use with [place].  Indications: asthma, chronic cough, bronchiectasis)   ? spironolactone (ALDACTONE) 25 mg tablet Take one tablet by mouth twice daily. Take with food.   ? tamsulosin (FLOMAX) 0.4 mg capsule Take 1 capsule by mouth daily.   ? zinc oxide 20 % topical ointment Place twice a day on the buttock wound     There were no vitals filed for this visit.    There is no height or weight on file to calculate BMI.     Wt Readings from Last 5 Encounters:   12/07/18 130.5 kg (287 lb 9.6 oz)   10/01/18 132 kg (291 lb)   09/11/18 132 kg (291 lb)   08/05/18 136.1 kg (300 lb)  07/23/18 136.1 kg (300 lb)       Physical Exam  Constitutional:       Appearance: He is ill-appearing.   HENT:      Head: Normocephalic and atraumatic.   Pulmonary:      Effort: Pulmonary effort is normal.      Comments: Coughing, using some mild accessory muscles (sternocleidomastoid).  Tachypnea.   Neurological:      General: No focal deficit present.      Mental Status: He is alert.   Psychiatric:         Mood and Affect: Mood normal.       Labs Reviewed:   Lab Results   Component Value Date/Time    HGBA1C 6.3 (H) 12/05/2018 01:57 AM    HGBA1C 6.7 (H) 08/12/2018    HGBA1C 6.8 (H) 04/03/2017 04:39 AM    HGBPOC 11.2 (L) 10/25/2012 10:58 AM    HGBPOC 12.9 (L) 10/25/2012 08:55 AM    TSH 0.51 12/14/2018 04:58 AM    FREET4R 0.99 12/15/2014 09:12 AM    CHOL 106 12/05/2018 01:57 AM    TRIG 108 12/05/2018 01:57 AM    HDL 31 (L) 12/05/2018 01:57 AM    LDL 60 12/05/2018 01:57 AM    NA 132 (L) 12/15/2018 04:35 AM    K 4.4 12/15/2018 04:35 AM    CL 91 (L) 12/15/2018 04:35 AM    CO2 34 (H) 12/15/2018 04:35 AM    GAP 7 12/15/2018 04:35 AM    BUN 56 (H) 12/15/2018 04:35 AM    CR 1.51 (H) 12/15/2018 04:35 AM GLU 165 (H) 12/15/2018 04:35 AM    CA 9.2 12/15/2018 04:35 AM    PO4 3.6 12/04/2018 07:40 PM    ALBUMIN 4.2 12/04/2018 07:40 PM    TOTPROT 7.5 12/04/2018 07:40 PM    ALKPHOS 50 12/04/2018 07:40 PM    AST 38 12/04/2018 07:40 PM    ALT 21 12/04/2018 07:40 PM    TOTBILI 1.0 12/04/2018 07:40 PM    GFR 45 (L) 12/15/2018 04:35 AM    GFRAA 54 (L) 12/15/2018 04:35 AM    PSA 2.96 11/25/2018         Assessment and Plan:    1. Hospital discharge follow-up    2. COVID-19 virus infection    3. Heart failure with reduced ejection fraction (HCC)    4. Acute respiratory failure with hypoxia (HCC)    5. Bradycardia      Ed is not doing well at all.  I am very worried him.  His SaO2 is 74% on 2L and his pulse oximeter is showing a pulse rate of 38. He does not look well even on telehealth, although the image was of poor quality.  He needs emergent evaluation and I do not feel that it is safe for him to come all the way to [place] at this time.  I advised that they call 911 immediately to get him to the [facility] ED which is the closest ED for emergent eval and stabilization.  We will coordinate with his local hospital.    ADDENDUM:  He was seen in his local ED and he being transferred to [facility] inpatient.  His BNP is elevated.  His D Dimer is elevated and similar to the level it was on discharge.  His CXR is showing pleural effusion.    RTC: PRN pending eval from ED    I spent over 25 minutes in direct care, and over half of the  visit was in counseling and coordination of care.      There are no Patient Instructions on file for this visit.    No follow-ups on file.

## 2018-12-20 ENCOUNTER — Ambulatory Visit: Admit: 2018-12-20 | Discharge: 2018-12-21 | Payer: MEDICARE

## 2018-12-20 ENCOUNTER — Encounter: Admit: 2018-12-20 | Discharge: 2018-12-20 | Payer: MEDICARE

## 2018-12-20 ENCOUNTER — Inpatient Hospital Stay: Admit: 2018-12-20 | Discharge: 2018-12-20 | Payer: MEDICARE

## 2018-12-20 DIAGNOSIS — J302 Other seasonal allergic rhinitis: Secondary | ICD-10-CM

## 2018-12-20 DIAGNOSIS — Z95828 Presence of other vascular implants and grafts: Secondary | ICD-10-CM

## 2018-12-20 DIAGNOSIS — N401 Enlarged prostate with lower urinary tract symptoms: Secondary | ICD-10-CM

## 2018-12-20 DIAGNOSIS — R609 Edema, unspecified: Secondary | ICD-10-CM

## 2018-12-20 DIAGNOSIS — Z8614 Personal history of Methicillin resistant Staphylococcus aureus infection: Secondary | ICD-10-CM

## 2018-12-20 DIAGNOSIS — J479 Bronchiectasis, uncomplicated: Secondary | ICD-10-CM

## 2018-12-20 DIAGNOSIS — E782 Mixed hyperlipidemia: Secondary | ICD-10-CM

## 2018-12-20 DIAGNOSIS — R69 Illness, unspecified: Secondary | ICD-10-CM

## 2018-12-20 DIAGNOSIS — Z09 Encounter for follow-up examination after completed treatment for conditions other than malignant neoplasm: Secondary | ICD-10-CM

## 2018-12-20 DIAGNOSIS — J45909 Unspecified asthma, uncomplicated: Secondary | ICD-10-CM

## 2018-12-20 DIAGNOSIS — I10 Essential (primary) hypertension: Secondary | ICD-10-CM

## 2018-12-20 DIAGNOSIS — U071 Pneumonia due to COVID-19 virus: Secondary | ICD-10-CM

## 2018-12-20 DIAGNOSIS — R05 Cough: Secondary | ICD-10-CM

## 2018-12-20 DIAGNOSIS — J449 Chronic obstructive pulmonary disease, unspecified: Secondary | ICD-10-CM

## 2018-12-20 DIAGNOSIS — I509 Heart failure, unspecified: Secondary | ICD-10-CM

## 2018-12-20 DIAGNOSIS — I2510 Atherosclerotic heart disease of native coronary artery without angina pectoris: Secondary | ICD-10-CM

## 2018-12-20 DIAGNOSIS — I429 Cardiomyopathy, unspecified: Secondary | ICD-10-CM

## 2018-12-20 DIAGNOSIS — G4733 Obstructive sleep apnea (adult) (pediatric): Secondary | ICD-10-CM

## 2018-12-20 DIAGNOSIS — L03116 Cellulitis of left lower limb: Secondary | ICD-10-CM

## 2018-12-20 DIAGNOSIS — J189 Pneumonia, unspecified organism: Secondary | ICD-10-CM

## 2018-12-20 DIAGNOSIS — N183 CKD (chronic kidney disease) stage 3, GFR 30-59 ml/min: Secondary | ICD-10-CM

## 2018-12-20 DIAGNOSIS — I714 Abdominal aortic aneurysm, without rupture: Secondary | ICD-10-CM

## 2018-12-20 DIAGNOSIS — Z9981 Dependence on supplemental oxygen: Secondary | ICD-10-CM

## 2018-12-20 DIAGNOSIS — R0600 Dyspnea, unspecified: Secondary | ICD-10-CM

## 2018-12-20 DIAGNOSIS — Z8679 Personal history of other diseases of the circulatory system: Secondary | ICD-10-CM

## 2018-12-20 DIAGNOSIS — M954 Acquired deformity of chest and rib: Secondary | ICD-10-CM

## 2018-12-20 DIAGNOSIS — M961 Postlaminectomy syndrome, not elsewhere classified: Secondary | ICD-10-CM

## 2018-12-20 DIAGNOSIS — J9601 Acute respiratory failure with hypoxia: Secondary | ICD-10-CM

## 2018-12-20 DIAGNOSIS — R001 Bradycardia, unspecified: Secondary | ICD-10-CM

## 2018-12-20 DIAGNOSIS — I5042 Chronic combined systolic (congestive) and diastolic (congestive) heart failure: Secondary | ICD-10-CM

## 2018-12-20 DIAGNOSIS — I5020 Unspecified systolic (congestive) heart failure: Secondary | ICD-10-CM

## 2018-12-20 DIAGNOSIS — K219 Gastro-esophageal reflux disease without esophagitis: Secondary | ICD-10-CM

## 2018-12-20 MED ORDER — ALBUTEROL SULFATE 2.5 MG/0.5 ML IN NEBU
5 mg | Freq: Two times a day (BID) | RESPIRATORY_TRACT | 0 refills | Status: DC | PRN
Start: 2018-12-20 — End: 2018-12-22
  Administered 2018-12-21 – 2018-12-22 (×3): 5 mg via RESPIRATORY_TRACT

## 2018-12-20 MED ORDER — BUMETANIDE 0.25 MG/ML IJ SOLN
2 mg | Freq: Once | INTRAVENOUS | 0 refills | Status: CP
Start: 2018-12-20 — End: ?
  Administered 2018-12-21: 03:00:00 2 mg via INTRAVENOUS

## 2018-12-20 MED ORDER — SODIUM CHLORIDE 3 % IN NEBU
4 mL | Freq: Two times a day (BID) | RESPIRATORY_TRACT | 0 refills | Status: DC
Start: 2018-12-20 — End: 2018-12-26
  Administered 2018-12-21 – 2018-12-26 (×10): 4 mL via RESPIRATORY_TRACT

## 2018-12-20 NOTE — Progress Notes
Did patient read financial policy, consent to treat, and notice of privacy practices? Yes    Does the patient give verbal consent to each policy? Yes    Does the patient have any vitals to report? Yes    Oxygen Vitals charted in O2? Yes    Is the patient in pain? 0 = No pain    Screening questions completed? Yes    Is the patient able to acces the Mychart message with the start visit link? Yes    Is patient in "virtual waiting room" Yes    Patient's wife states he's been having trouble breathing.  States last night o2 sat was 85% without oxygen.  Used C-pap machine last night.  Patient has been on 2 liters of 02 today.    Patient's sounds short or breath on the phone with labored breathing noted by sound.    Myrtie Hawk, RN

## 2018-12-21 ENCOUNTER — Inpatient Hospital Stay: Admit: 2018-12-21 | Discharge: 2018-12-21 | Payer: MEDICARE

## 2018-12-21 ENCOUNTER — Encounter: Admit: 2018-12-21 | Discharge: 2018-12-20 | Payer: MEDICARE

## 2018-12-21 ENCOUNTER — Inpatient Hospital Stay: Admit: 2018-12-21 | Payer: MEDICARE

## 2018-12-21 LAB — BLOOD GASES, ARTERIAL
Lab: 219 mmHg — ABNORMAL HIGH (ref 80–100)
Lab: 27 MMOL/L (ref 21–28)
Lab: 3 MMOL/L
Lab: 37 mmHg (ref 35–45)
Lab: 7.4 — ABNORMAL HIGH (ref 7.35–7.45)
Lab: 99 % — ABNORMAL HIGH (ref 95–99)

## 2018-12-21 LAB — URINALYSIS DIPSTICK
Lab: NEGATIVE 10*3/uL (ref 0–0.20)
Lab: NEGATIVE K/UL (ref 0–0.45)
Lab: NEGATIVE
Lab: NEGATIVE
Lab: NEGATIVE
Lab: NEGATIVE

## 2018-12-21 LAB — TSH WITH FREE T4 REFLEX: Lab: 4.3 uU/mL — ABNORMAL HIGH (ref 0.35–5.00)

## 2018-12-21 LAB — COMPREHENSIVE METABOLIC PANEL
Lab: 133 MMOL/L — ABNORMAL LOW (ref 137–147)
Lab: 3.1 g/dL — ABNORMAL LOW (ref 3.5–5.0)
Lab: 4.2 MMOL/L — ABNORMAL LOW (ref 3.5–5.1)
Lab: 47 U/L — ABNORMAL HIGH (ref 7–40)
Lab: 6.4 g/dL (ref 6.0–8.0)
Lab: 70 U/L — ABNORMAL LOW (ref 25–110)
Lab: 8.6 mg/dL (ref 8.5–10.6)

## 2018-12-21 LAB — BNP (B-TYPE NATRIURETIC PEPTI): Lab: 207 pg/mL — ABNORMAL HIGH (ref 0–100)

## 2018-12-21 LAB — COVID-19 (SARS-COV-2) PCR

## 2018-12-21 LAB — CBC AND DIFF: Lab: 9 10*3/uL (ref 4.5–11.0)

## 2018-12-21 LAB — URINALYSIS, MICROSCOPIC

## 2018-12-21 LAB — LACTIC ACID (BG - RAPID LACTATE): Lab: 1.1 MMOL/L — ABNORMAL LOW (ref 0.5–2.0)

## 2018-12-21 LAB — MAGNESIUM: Lab: 1.7 mg/dL — ABNORMAL HIGH (ref 1.6–2.6)

## 2018-12-21 LAB — TROPONIN-I: Lab: 0 ng/mL — ABNORMAL HIGH (ref 0.0–0.05)

## 2018-12-21 MED ORDER — DULOXETINE 60 MG PO CPDR
60 mg | Freq: Every day | ORAL | 0 refills | Status: DC
Start: 2018-12-21 — End: 2018-12-26
  Administered 2018-12-21 – 2018-12-26 (×6): 60 mg via ORAL

## 2018-12-21 MED ORDER — VANCOMYCIN 1,750 MG IVPB
1750 mg | INTRAVENOUS | 0 refills | Status: DC
Start: 2018-12-21 — End: 2018-12-26
  Administered 2018-12-22 – 2018-12-26 (×10): 1750 mg via INTRAVENOUS

## 2018-12-21 MED ORDER — CEFTRIAXONE INJ 1GM IVP
1 g | INTRAVENOUS | 0 refills | Status: DC
Start: 2018-12-21 — End: 2018-12-21
  Administered 2018-12-21: 07:00:00 1 g via INTRAVENOUS

## 2018-12-21 MED ORDER — PERFLUTREN LIPID MICROSPHERES 1.1 MG/ML IV SUSP
1-20 mL | Freq: Once | INTRAVENOUS | 0 refills | Status: CP | PRN
Start: 2018-12-21 — End: ?
  Administered 2018-12-21: 17:00:00 2 mL via INTRAVENOUS

## 2018-12-21 MED ORDER — DOCUSATE SODIUM 50 MG/5 ML PO LIQD
100 mg | Freq: Two times a day (BID) | ORAL | 0 refills | Status: DC
Start: 2018-12-21 — End: 2018-12-21

## 2018-12-21 MED ORDER — MAGNESIUM SULFATE IN D5W 1 GRAM/100 ML IV PGBK
1 g | INTRAVENOUS | 0 refills | Status: CP
Start: 2018-12-21 — End: ?
  Administered 2018-12-22: 01:00:00 1 g via INTRAVENOUS

## 2018-12-21 MED ORDER — MELATONIN 5 MG PO TAB
5 mg | Freq: Once | ORAL | 0 refills | Status: CP
Start: 2018-12-21 — End: ?
  Administered 2018-12-22: 02:00:00 5 mg via ORAL

## 2018-12-21 MED ORDER — BUDESONIDE-FORMOTEROL 160-4.5 MCG/ACTUATION IN HFAA
2 | Freq: Two times a day (BID) | RESPIRATORY_TRACT | 0 refills | Status: DC
Start: 2018-12-21 — End: 2018-12-26
  Administered 2018-12-21: 10:00:00 2 via RESPIRATORY_TRACT

## 2018-12-21 MED ORDER — HYDROCODONE-ACETAMINOPHEN 5-325 MG PO TAB
1 | ORAL | 0 refills | Status: DC | PRN
Start: 2018-12-21 — End: 2018-12-21

## 2018-12-21 MED ORDER — ATORVASTATIN 40 MG PO TAB
40 mg | Freq: Every evening | ORAL | 0 refills | Status: DC
Start: 2018-12-21 — End: 2018-12-26
  Administered 2018-12-21 – 2018-12-26 (×6): 40 mg via ORAL

## 2018-12-21 MED ORDER — ALBUTEROL SULFATE 2.5 MG /3 ML (0.083 %) IN NEBU
2.5 mg | RESPIRATORY_TRACT | 0 refills | Status: DC | PRN
Start: 2018-12-21 — End: 2018-12-22

## 2018-12-21 MED ORDER — CEFTRIAXONE INJ 2GM IVP
2 g | INTRAVENOUS | 0 refills | Status: DC
Start: 2018-12-21 — End: 2018-12-26
  Administered 2018-12-22 – 2018-12-26 (×5): 2 g via INTRAVENOUS

## 2018-12-21 MED ORDER — BUMETANIDE 0.25 MG/ML IJ SOLN
4 mg | Freq: Once | INTRAVENOUS | 0 refills | Status: DC
Start: 2018-12-21 — End: 2018-12-21

## 2018-12-21 MED ORDER — AMITRIPTYLINE(#)/GABAPENTIN/EMU OIL 4/4/10%IN HRT TOPICAL CRM
TOPICAL | 0 refills | Status: DC
Start: 2018-12-21 — End: 2018-12-26
  Administered 2018-12-21 – 2018-12-23 (×2): via TOPICAL

## 2018-12-21 MED ORDER — SENNOSIDES 8.8 MG/5 ML PO SYRP
17.6 mg | Freq: Two times a day (BID) | ORAL | 0 refills | Status: DC
Start: 2018-12-21 — End: 2018-12-21

## 2018-12-21 MED ORDER — CYANOCOBALAMIN (VITAMIN B-12) 1,000 MCG/ML IJ SOLN
1000 ug | Freq: Once | INTRAMUSCULAR | 0 refills | Status: CP
Start: 2018-12-21 — End: ?
  Administered 2018-12-21: 17:00:00 1000 ug via INTRAMUSCULAR

## 2018-12-21 MED ORDER — TAMSULOSIN 0.4 MG PO CAP
.4 mg | Freq: Every day | ORAL | 0 refills | Status: DC
Start: 2018-12-21 — End: 2018-12-26
  Administered 2018-12-21 – 2018-12-26 (×6): 0.4 mg via ORAL

## 2018-12-21 MED ORDER — VANCOMYCIN RANDOM DOSING
1 | INTRAVENOUS | 0 refills | Status: DC
Start: 2018-12-21 — End: 2018-12-21

## 2018-12-21 MED ORDER — OSELTAMIVIR 6 MG/ML PO SUSR
75 mg | Freq: Two times a day (BID) | ORAL | 0 refills | Status: DC
Start: 2018-12-21 — End: 2018-12-21

## 2018-12-21 MED ORDER — BUMETANIDE 0.25 MG/ML IJ SOLN
2 mg | INTRAVENOUS | 0 refills | Status: CP
Start: 2018-12-21 — End: ?
  Administered 2018-12-21: 22:00:00 2 mg via INTRAVENOUS

## 2018-12-21 MED ORDER — POLYETHYLENE GLYCOL 3350 17 GRAM PO PWPK
1 | Freq: Every day | ORAL | 0 refills | Status: DC
Start: 2018-12-21 — End: 2018-12-26
  Administered 2018-12-21 – 2018-12-26 (×6): 17 g via ORAL

## 2018-12-21 MED ORDER — BUMETANIDE 0.25 MG/ML IJ SOLN
2 mg | Freq: Once | INTRAVENOUS | 0 refills | Status: CP
Start: 2018-12-21 — End: ?
  Administered 2018-12-21: 16:00:00 2 mg via INTRAVENOUS

## 2018-12-21 MED ORDER — MAGNESIUM SULFATE IN D5W 1 GRAM/100 ML IV PGBK
1 g | INTRAVENOUS | 0 refills | Status: CP
Start: 2018-12-21 — End: ?
  Administered 2018-12-21: 15:00:00 1 g via INTRAVENOUS

## 2018-12-21 MED ORDER — PANTOPRAZOLE 40 MG PO TBEC
40 mg | Freq: Every day | ORAL | 0 refills | Status: DC
Start: 2018-12-21 — End: 2018-12-26
  Administered 2018-12-21 – 2018-12-26 (×6): 40 mg via ORAL

## 2018-12-21 MED ORDER — VANCOMYCIN PHARMACY TO MANAGE
1 | 0 refills | Status: DC
Start: 2018-12-21 — End: 2018-12-26

## 2018-12-21 MED ORDER — BUMETANIDE 0.25 MG/ML IJ SOLN
2 mg | Freq: Once | INTRAVENOUS | 0 refills | Status: CP
Start: 2018-12-21 — End: ?
  Administered 2018-12-21: 22:00:00 2 mg via INTRAVENOUS

## 2018-12-21 MED ORDER — OSELTAMIVIR 6 MG/ML PO SUSR
30 mg | Freq: Two times a day (BID) | ORAL | 0 refills | Status: DC
Start: 2018-12-21 — End: 2018-12-21
  Administered 2018-12-21: 19:00:00 30 mg via ORAL

## 2018-12-21 MED ORDER — OSELTAMIVIR 6 MG/ML PO SUSR
75 mg | Freq: Every day | ORAL | 0 refills | Status: DC
Start: 2018-12-21 — End: 2018-12-21

## 2018-12-21 MED ORDER — ACETAMINOPHEN 325 MG PO TAB
650 mg | ORAL | 0 refills | Status: DC | PRN
Start: 2018-12-21 — End: 2018-12-26
  Administered 2018-12-21 – 2018-12-22 (×5): 650 mg via ORAL

## 2018-12-21 MED ORDER — OMEGA 3-DHA-EPA-FISH OIL 300-1,000 MG PO CPDR
1000 mg | Freq: Every day | ORAL | 0 refills | Status: DC
Start: 2018-12-21 — End: 2018-12-26
  Administered 2018-12-21 – 2018-12-26 (×6): 1000 mg via ORAL

## 2018-12-21 MED ORDER — MAGNESIUM SULFATE IN D5W 1 GRAM/100 ML IV PGBK
1 g | INTRAVENOUS | 0 refills | Status: CP
Start: 2018-12-21 — End: ?
  Administered 2018-12-21: 19:00:00 1 g via INTRAVENOUS

## 2018-12-21 MED ORDER — SENNOSIDES 8.6 MG PO TAB
1 | Freq: Two times a day (BID) | ORAL | 0 refills | Status: DC
Start: 2018-12-21 — End: 2018-12-26
  Administered 2018-12-21 – 2018-12-26 (×11): 1 via ORAL

## 2018-12-21 MED ORDER — ASPIRIN 81 MG PO TBEC
81 mg | Freq: Every day | ORAL | 0 refills | Status: DC
Start: 2018-12-21 — End: 2018-12-26
  Administered 2018-12-21 – 2018-12-26 (×6): 81 mg via ORAL

## 2018-12-21 MED ORDER — VANCOMYCIN 2,500 MG IVPB
2500 mg | Freq: Once | INTRAVENOUS | 0 refills | Status: CP
Start: 2018-12-21 — End: ?
  Administered 2018-12-21 (×2): 2500 mg via INTRAVENOUS

## 2018-12-21 MED ORDER — VANCOMYCIN (15-40KG) IVPB
15 mg/kg | INTRAVENOUS | 0 refills | Status: DC
Start: 2018-12-21 — End: 2018-12-21

## 2018-12-21 MED ORDER — FINASTERIDE 5 MG PO TAB
5 mg | Freq: Every day | ORAL | 0 refills | Status: DC
Start: 2018-12-21 — End: 2018-12-26
  Administered 2018-12-21 – 2018-12-26 (×6): 5 mg via ORAL

## 2018-12-21 MED ORDER — ENOXAPARIN 30 MG/0.3 ML SC SYRG
30 mg | Freq: Two times a day (BID) | SUBCUTANEOUS | 0 refills | Status: DC
Start: 2018-12-21 — End: 2018-12-26
  Administered 2018-12-21 – 2018-12-26 (×11): 30 mg via SUBCUTANEOUS

## 2018-12-21 MED ORDER — MONTELUKAST 10 MG PO TAB
10 mg | Freq: Every evening | ORAL | 0 refills | Status: DC
Start: 2018-12-21 — End: 2018-12-26
  Administered 2018-12-21 – 2018-12-26 (×6): 10 mg via ORAL

## 2018-12-21 MED ADMIN — SODIUM CHLORIDE 0.9 % IV SOLP [27838]: 250 mL | INTRAVENOUS | @ 15:00:00 | Stop: 2018-12-21 | NDC 00338004902

## 2018-12-21 NOTE — Progress Notes
Patient arrived to room # [phone]) via bed accompanied by transport. Patient transferred to the bed with assistance. Bedside safety checks completed. Initial patient assessment completed. Refer to flowsheet for details.    Admission skin assessment completed with: Apolonio Schneiders, RN    Pressure injury present on arrival?: Yes    1. Head/Face/Neck: No  2. Trunk/Back: No  3. Upper Extremities: No  4. Lower Extremities: No  5. Pelvic/Coccyx: Yes  6. Assessed for device associated injury? Yes  7. Malnutrition Screening Tool (Nursing Nutrition Assessment) Completed? No, NPO    See Doc Flowsheet for additional wound details.     INTERVENTIONS: stage two on left buttocks (appearing pressure related) along with several other non pressure related wounds. Wound consult placed. Criticaid applied to buttocks and coccyx + all other appropriate interventions based on braden score.

## 2018-12-22 ENCOUNTER — Inpatient Hospital Stay: Admit: 2018-12-22 | Discharge: 2018-12-22 | Payer: MEDICARE

## 2018-12-22 MED ORDER — CYANOCOBALAMIN (VITAMIN B-12) 1,000 MCG/ML IJ SOLN
1000 ug | Freq: Every day | INTRAMUSCULAR | 0 refills | Status: DC
Start: 2018-12-22 — End: 2018-12-26
  Administered 2018-12-22 – 2018-12-26 (×5): 1000 ug via INTRAMUSCULAR

## 2018-12-22 MED ORDER — IMS MIXTURE TEMPLATE
20 mg | Freq: Every day | ORAL | 0 refills | Status: DC
Start: 2018-12-22 — End: 2018-12-25
  Administered 2018-12-23 – 2018-12-25 (×6): 20 mg via ORAL

## 2018-12-22 MED ORDER — IMS MIXTURE TEMPLATE
10 mg | Freq: Once | ORAL | 0 refills | Status: CP
Start: 2018-12-22 — End: ?
  Administered 2018-12-23 (×2): 10 mg via ORAL

## 2018-12-22 MED ORDER — BUMETANIDE 0.25 MG/ML IJ SOLN
2 mg | Freq: Once | INTRAVENOUS | 0 refills | Status: CP
Start: 2018-12-22 — End: ?
  Administered 2018-12-22: 11:00:00 2 mg via INTRAVENOUS

## 2018-12-22 MED ORDER — BUMETANIDE 2 MG PO TAB
2 mg | Freq: Every day | ORAL | 0 refills | Status: DC
Start: 2018-12-22 — End: 2018-12-26
  Administered 2018-12-23 – 2018-12-26 (×4): 2 mg via ORAL

## 2018-12-22 MED ORDER — IPRATROPIUM BROMIDE 0.02 % IN SOLN
0.5 mg | Freq: Four times a day (QID) | RESPIRATORY_TRACT | 0 refills | Status: DC | PRN
Start: 2018-12-22 — End: 2018-12-25
  Administered 2018-12-22 – 2018-12-25 (×9): 0.5 mg via RESPIRATORY_TRACT

## 2018-12-22 MED ORDER — SPIRONOLACTONE 25 MG PO TAB
25 mg | Freq: Every day | ORAL | 0 refills | Status: DC
Start: 2018-12-22 — End: 2018-12-24
  Administered 2018-12-23 – 2018-12-24 (×2): 25 mg via ORAL

## 2018-12-22 MED ORDER — ALBUTEROL SULFATE 2.5 MG/0.5 ML IN NEBU
2.5 mg | Freq: Four times a day (QID) | RESPIRATORY_TRACT | 0 refills | Status: DC | PRN
Start: 2018-12-22 — End: 2018-12-25
  Administered 2018-12-22 – 2018-12-25 (×9): 2.5 mg via RESPIRATORY_TRACT

## 2018-12-23 ENCOUNTER — Inpatient Hospital Stay: Admit: 2018-12-23 | Discharge: 2018-12-23 | Payer: MEDICARE

## 2018-12-23 ENCOUNTER — Encounter: Admit: 2018-12-23 | Discharge: 2018-12-23 | Payer: MEDICARE

## 2018-12-23 MED ORDER — MUPIROCIN 2 % TP OINT
Freq: Two times a day (BID) | TOPICAL | 0 refills | Status: DC
Start: 2018-12-23 — End: 2018-12-26
  Administered 2018-12-24: 04:00:00 via TOPICAL

## 2018-12-23 MED ORDER — SULFAMETHOXAZOLE-TRIMETHOPRIM 800-160 MG PO TAB
1 | ORAL | 0 refills | Status: DC
Start: 2018-12-23 — End: 2018-12-26
  Administered 2018-12-25: 15:00:00 1 via ORAL

## 2018-12-23 MED ORDER — MELATONIN 5 MG PO TAB
5 mg | Freq: Once | ORAL | 0 refills | Status: AC
Start: 2018-12-23 — End: ?

## 2018-12-24 ENCOUNTER — Inpatient Hospital Stay: Admit: 2018-12-24 | Discharge: 2018-12-24 | Payer: MEDICARE

## 2018-12-24 MED ORDER — BENZONATATE 100 MG PO CAP
100 mg | Freq: Three times a day (TID) | ORAL | 0 refills | Status: DC | PRN
Start: 2018-12-24 — End: 2018-12-26
  Administered 2018-12-25 (×2): 100 mg via ORAL

## 2018-12-24 MED ORDER — BUMETANIDE 0.25 MG/ML IJ SOLN
2 mg | Freq: Once | INTRAVENOUS | 0 refills | Status: CP
Start: 2018-12-24 — End: ?
  Administered 2018-12-24: 2 mg via INTRAVENOUS

## 2018-12-24 MED ORDER — SPIRONOLACTONE 25 MG PO TAB
25 mg | Freq: Two times a day (BID) | ORAL | 0 refills | Status: DC
Start: 2018-12-24 — End: 2018-12-26
  Administered 2018-12-24 – 2018-12-26 (×4): 25 mg via ORAL

## 2018-12-24 MED ORDER — MELATONIN 3 MG PO TAB
3 mg | Freq: Every evening | ORAL | 0 refills | Status: DC | PRN
Start: 2018-12-24 — End: 2018-12-25
  Administered 2018-12-24: 08:00:00 3 mg via ORAL

## 2018-12-24 MED ORDER — MELATONIN 5 MG PO TAB
5 mg | Freq: Every evening | ORAL | 0 refills | Status: DC | PRN
Start: 2018-12-24 — End: 2018-12-26
  Administered 2018-12-25 – 2018-12-26 (×2): 5 mg via ORAL

## 2018-12-25 MED ORDER — ALBUTEROL SULFATE 2.5 MG/0.5 ML IN NEBU
2.5 mg | Freq: Two times a day (BID) | RESPIRATORY_TRACT | 0 refills | Status: DC | PRN
Start: 2018-12-25 — End: 2018-12-26
  Administered 2018-12-26 (×2): 2.5 mg via RESPIRATORY_TRACT

## 2018-12-25 MED ORDER — IMS MIXTURE TEMPLATE
30 mg | Freq: Every day | ORAL | 0 refills | Status: DC
Start: 2018-12-25 — End: 2018-12-26
  Administered 2018-12-26 (×2): 30 mg via ORAL

## 2018-12-25 MED ORDER — IPRATROPIUM BROMIDE 0.02 % IN SOLN
0.5 mg | Freq: Two times a day (BID) | RESPIRATORY_TRACT | 0 refills | Status: DC | PRN
Start: 2018-12-25 — End: 2018-12-26
  Administered 2018-12-26 (×2): 0.5 mg via RESPIRATORY_TRACT

## 2018-12-26 MED ORDER — PREDNISONE 10 MG PO TAB
30 mg | ORAL_TABLET | Freq: Every day | ORAL | 0 refills | Status: DC
Start: 2018-12-26 — End: 2019-02-07

## 2018-12-26 MED ORDER — SULFAMETHOXAZOLE-TRIMETHOPRIM 800-160 MG PO TAB
1 | ORAL_TABLET | ORAL | 0 refills | Status: DC
Start: 2018-12-26 — End: 2019-02-07

## 2018-12-27 ENCOUNTER — Encounter: Admit: 2018-12-27 | Discharge: 2018-12-27 | Payer: MEDICARE

## 2018-12-30 ENCOUNTER — Encounter: Admit: 2018-12-30 | Discharge: 2018-12-30 | Payer: MEDICARE

## 2018-12-30 NOTE — Progress Notes
Patient's wife reached for [facility] following hospitalization for covid 19 pneumonia.   Wife reports her husband is doing well.   She became sick first, but was not able to get the convalescent plasma, so she has taken longer to recover.   He feels like he is ready for for PT, which he will do locally.   She indicates much of his problems started during his post hospital stay at [facility].   She is wondering when to stop using his oxygen.   She plans to ask cardiology provider tomorrow about post [place] visit.     Wife is very appreciative of their recovery.   The experience has been very liberating.   She has lost 25# since her illness.   She and Ed are going to focus on health behaviors.   He is much stronger and able to walk with minimal stand by assistance.     She is very appreciative of Dr. [employer] care and the follow up by health system staff.

## 2018-12-31 ENCOUNTER — Encounter: Admit: 2018-12-31 | Discharge: 2018-12-31 | Payer: MEDICARE

## 2018-12-31 ENCOUNTER — Ambulatory Visit: Admit: 2018-12-31 | Discharge: 2018-12-31 | Payer: MEDICARE

## 2018-12-31 DIAGNOSIS — R05 Cough: Secondary | ICD-10-CM

## 2018-12-31 DIAGNOSIS — I429 Cardiomyopathy, unspecified: Secondary | ICD-10-CM

## 2018-12-31 DIAGNOSIS — J189 Pneumonia, unspecified organism: Secondary | ICD-10-CM

## 2018-12-31 DIAGNOSIS — R0600 Dyspnea, unspecified: Secondary | ICD-10-CM

## 2018-12-31 DIAGNOSIS — I509 Heart failure, unspecified: Secondary | ICD-10-CM

## 2018-12-31 DIAGNOSIS — K219 Gastro-esophageal reflux disease without esophagitis: Secondary | ICD-10-CM

## 2018-12-31 DIAGNOSIS — Z8614 Personal history of Methicillin resistant Staphylococcus aureus infection: Secondary | ICD-10-CM

## 2018-12-31 DIAGNOSIS — J449 Chronic obstructive pulmonary disease, unspecified: Secondary | ICD-10-CM

## 2018-12-31 DIAGNOSIS — Z8679 Personal history of other diseases of the circulatory system: Secondary | ICD-10-CM

## 2018-12-31 DIAGNOSIS — M961 Postlaminectomy syndrome, not elsewhere classified: Secondary | ICD-10-CM

## 2018-12-31 DIAGNOSIS — I2510 Atherosclerotic heart disease of native coronary artery without angina pectoris: Secondary | ICD-10-CM

## 2018-12-31 DIAGNOSIS — J45909 Unspecified asthma, uncomplicated: Secondary | ICD-10-CM

## 2018-12-31 DIAGNOSIS — I5042 Chronic combined systolic (congestive) and diastolic (congestive) heart failure: Secondary | ICD-10-CM

## 2018-12-31 DIAGNOSIS — N401 Enlarged prostate with lower urinary tract symptoms: Secondary | ICD-10-CM

## 2018-12-31 DIAGNOSIS — Z9981 Dependence on supplemental oxygen: Secondary | ICD-10-CM

## 2018-12-31 DIAGNOSIS — M954 Acquired deformity of chest and rib: Secondary | ICD-10-CM

## 2018-12-31 DIAGNOSIS — J479 Bronchiectasis, uncomplicated: Secondary | ICD-10-CM

## 2018-12-31 DIAGNOSIS — E782 Mixed hyperlipidemia: Secondary | ICD-10-CM

## 2018-12-31 DIAGNOSIS — R609 Edema, unspecified: Secondary | ICD-10-CM

## 2018-12-31 DIAGNOSIS — L89309 Pressure ulcer of unspecified buttock, unspecified stage: Secondary | ICD-10-CM

## 2018-12-31 DIAGNOSIS — J302 Other seasonal allergic rhinitis: Secondary | ICD-10-CM

## 2018-12-31 DIAGNOSIS — N183 CKD (chronic kidney disease) stage 3, GFR 30-59 ml/min: Secondary | ICD-10-CM

## 2018-12-31 DIAGNOSIS — G4733 Obstructive sleep apnea (adult) (pediatric): Secondary | ICD-10-CM

## 2018-12-31 DIAGNOSIS — I714 Abdominal aortic aneurysm, without rupture: Secondary | ICD-10-CM

## 2018-12-31 DIAGNOSIS — L03116 Cellulitis of left lower limb: Secondary | ICD-10-CM

## 2018-12-31 DIAGNOSIS — I10 Essential (primary) hypertension: Secondary | ICD-10-CM

## 2018-12-31 DIAGNOSIS — Z95828 Presence of other vascular implants and grafts: Secondary | ICD-10-CM

## 2018-12-31 LAB — BASIC METABOLIC PANEL
Lab: 1.3 mg/dL — ABNORMAL HIGH (ref 0.4–1.24)
Lab: 104 MMOL/L (ref 98–110)
Lab: 124 mg/dL — ABNORMAL HIGH (ref 70–100)
Lab: 139 MMOL/L (ref 137–147)
Lab: 26 MMOL/L (ref 21–30)
Lab: 37 mg/dL — ABNORMAL HIGH (ref 7–25)
Lab: 4 MMOL/L (ref 3.5–5.1)
Lab: 53 mL/min — ABNORMAL LOW (ref >60)
Lab: 9.3 mg/dL (ref 8.5–10.6)
Lab: >60 mL/min (ref >60)
Lab: 9 (ref 3–12)

## 2018-12-31 NOTE — Telephone Encounter
I called and spoke with patient's wife.  She will keep the area dry.  I also explained that Dr. Gibson Ramp was going to order home health wound care services, but she understands he can't do this because he's going to outpatient PT.    She will schedule a hospital follow up within the next two weeks with Mallory Shirk.    Myrtie Hawk, RN

## 2018-12-31 NOTE — Telephone Encounter
The mainstay of treatment will be keeping the area of dry and frequent offloading.  At a minimum, he should change positions every 2 hours, but ideally more frequently than that. I am also going to set up home health for wound care.   Janice Norrie, can you please help [place] set this up?

## 2018-12-31 NOTE — Progress Notes
Daily CardioMEMS Readings    Goal PA Diastolic: 34-91 mmHg   PA Diastolic Thresholds: 7-91 mmHg    Taken on PA Systolic PA Diastolic PA Mean Heart Rate   12-31-2018, 06:15 AM 34 mmHg 14 mmHg 21 mmHg 108 bpm   12-25-2018, 08:32 AM 25 mmHg 5 mmHg 13 mmHg 77 bpm   12-24-2018, 09:35 AM 32 mmHg 7 mmHg 15 mmHg 78 bpm   12-23-2018, 10:32 AM 33 mmHg 12 mmHg 18 mmHg 63 bpm   12-20-2018, 12:38 PM 33 mmHg 11 mmHg 18 mmHg 120 bpm   12-04-2018, 02:26 PM 26 mmHg 11 mmHg 16 mmHg 128 bpm   12-02-2018, 07:32 AM 30 mmHg 11 mmHg 18 mmHg 116 bpm   11-28-2018, 09:54 AM 33 mmHg 12 mmHg 19 mmHg 98 bpm   11-27-2018, 12:29 PM 28 mmHg 11 mmHg 17 mmHg 105 bpm   11-25-2018, 09:33 AM 25 mmHg 9 mmHg 14 mmHg 102 bpm   11-24-2018, 08:36 AM 31 mmHg 10 mmHg 17 mmHg 98 bpm   11-21-2018, 09:16 AM 25 mmHg 9 mmHg 14 mmHg 104 bpm   11-20-2018, 04:16 PM 30 mmHg 11 mmHg 17 mmHg 104 bpm   11-19-2018, 02:00 PM 25 mmHg 11 mmHg 15 mmHg 109 bpm   11-18-2018, 08:51 AM 25 mmHg 9 mmHg 14 mmHg 99 bpm   11-17-2018, 08:56 AM 26 mmHg 10 mmHg 16 mmHg 94 bpm   11-15-2018, 03:24 PM 29 mmHg 12 mmHg 17 mmHg 110 bpm

## 2018-12-31 NOTE — Progress Notes
Date of Service: 12/31/2018    Ruben Hovel. is a 78 y.o. male.   He follows with Dr. Kathreen Cosier    HPI     Ruben Wood is seen today in a 8-month follow up visit. 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 He had also previously been following with Dr. Vita Barley in the weight reduction clinic but since not following the diet has regained much of the weight. There have been several discussions about this patient and the potential for using immunoglobulin replacement therapy due to persistent hypogammaglobulinemia.    He did see Dr. Harley Alto in an annual visit regarding his abdominal aortic aneurysm on 09/06/2017 with a CT of the abdomen the same day revealing aneurysmal sac size continues to decrease, now measuring 4.5 cm, previously 5 cm one year ago.  He asked that the patient return in 1 year with repeat noncontrast CT of the abdomen and pelvis.    He has had 2 recent hospital admissions.  The first was from 10/14?12/15/2018 with acute respiratory failure and hypoxia as a result of COVID-19 infection.  He was followed by ID and completed convalescent plasma therapy, remdesivir, and 10 days of dexamethasone.  He was discharged to SNF on 10/25.  His midodrine was discontinued during this hospitalization.  Unfortunately he required readmission to [facility] 10/30?12/26/2018 with dyspnea/hypoxic respiratory failure.  Notably he had missed several days of diuretic while in a inpatient SNF the week prior.  He was treated in the MICU with IV diuresis, ceftriaxone for suspected UTI, and vancomycin for left lower extremity cellulitis.  He was also treated with prolonged high-dose dexamethasone taper due to concern for post Covid fibroproliferative pulmonary disorder.  He did have lower extremity arterial evaluation during his stay.  He was also followed by the cardiology team in regard to his heart failure.  He was discharged with Bumex 2 mg daily and spironolactone 25 mg twice daily.      Ruben Wood presents today accompanied by his wife, [name].  He reports that overall his breathing has been much improved since his hospital discharge.  He is feeling slightly more short of breath today and has not yet had his usual medications.  He continues to have lower extremity edema which has remained stable since discharge.  He also has continued tenderness of his bilateral lower extremities.  He denies any chest pain, palpitation or heart racing, lightheadedness, syncope, or strokelike symptoms.       Vitals:    12/31/18 1055   BP: (!) 140/49   BP Source: Arm, Left Upper   Pulse: 87   SpO2: 94%   Weight: 129.3 kg (285 lb)   PainSc: Zero     Body mass index is 39.75 kg/m?[name]     Past Medical History  Patient Active Problem List    Diagnosis Date Noted   ? Complex care coordination 05/04/2017     Priority: High     Class: Acute   ? Acute cystitis 12/21/2018   ? Pneumonia due to COVID-19 virus 12/05/2018   ? Chronic bronchitis with acute  exacerbation (HCC) 12/05/2018   ? COVID-19 virus infection 12/05/2018   ? Heart failure with reduced ejection fraction (HCC) 12/05/2018   ? Cough 12/04/2018   ? 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 Type 2 diabetes mellitus with hyperglycemia, without long-term current use of insulin (HCC) 03/12/2018   ? Atrial fibrillation (HCC) 03/12/2018   ? Orthostatic hypotension 12/28/2017   ? Morbid obesity (HCC) 11/16/2017 ? Chronic respiratory failure with hypercapnia (HCC) 11/16/2017   ? Concussion with loss of consciousness of 30 minutes or less 11/16/2017   ? Acute pain due to trauma 11/15/2017   ? Episode of syncope 11/15/2017   ? Facial laceration 11/15/2017   ? Depression 11/15/2017   ? Closed traumatic dislocation of right shoulder 11/15/2017   ? Impaired mobility 11/15/2017   ? Trauma 11/14/2017   ? OAB (overactive bladder)      Urinary frequency, urgency, urge urinary incontinence (UUI), nocturia, nocturnal enuresis.  -- trial Mirabegron 25 mg --> improved, but persistent sx's.  -- trial Mirabegron 50 mg.     ? Urinary incontinence, urge      See OAB A&P note.     ? Urinary urgency      See OAB A&P note.     ? Nocturia      See OAB A&P note.     ? Nocturnal enuresis    ? Constipation    ? Swelling of left hand 10/07/2017     Resolved, unclear etiology.     ? Chronic combined systolic and diastolic CHF (congestive heart failure) (HCC) 07/13/2017   ? Upper extremity pain, anterior, right 07/03/2017   ? Right hand pain 07/03/2017   ? Coronary artery disease due to lipid rich plaque 06/29/2017   ? Oropharyngeal dysphagia 05/10/2017   ? Bronchiectasis with acute lower respiratory infection (HCC) 05/10/2017   ? Acute on chronic respiratory failure with hypoxia (HCC) 05/02/2017   ? Acute on chronic systolic congestive heart failure (HCC) 05/02/2017   ? Acute on chronic systolic and diastolic heart failure, NYHA class 3 (HCC) 03/27/2017   ? Urinary frequency 03/08/2017   ? 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     ? 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  D/c'd Tamsulosin d/t lack of symptom improvement.     ? Enrolled in chronic care management 02/28/2017   ? Elevated prostate specific antigen (PSA) PNBx (03/01/2017): (L) high-grade prostatic intraepithelial neoplasia (HG PIN), 1/6 cores; [name], PA-C.     ? Spondylolisthesis, lumbar region 12/13/2016   ? Acute respiratory failure with hypoxia (HCC) 11/07/2016   ? Lactic acid acidosis 11/07/2016   ? Hyperglycemia 11/07/2016   ? Lumbar post-laminectomy syndrome      L4-5     ? Iron deficiency 10/09/2016   ? Spinal stenosis of lumbosacral region 09/20/2016   ? Spondylolisthesis of lumbosacral region 09/20/2016   ? Lumbar radiculopathy 09/20/2016   ? Osteoarthritis of spine with radiculopathy, lumbosacral region 09/20/2016   ? Venous ulcer of left leg (HCC) 09/13/2016   ? 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 Lower extremity edema 07/13/2016   ? 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 He had a normal response to pneumococca vaccination; he had 17/23 positive serotypes. He has normal tetanus toxoid Ab and CH50.  His T&B cell panel revealed a mildly low B-cell count at 68 right after a time of acute illness.  He had positive diphtheria antibody.    Likely multifactorial with significant comorbidities including frequent steroid use for bronchiectasis exacerbations, frequent infections, and possibly a nutritional component (low total protein).     Ref. Range 02/25/2016 09/14/2016 02/28/2017 04/19/2017 05/05/2017  09/11/17   IgG 762 - 1,488 MG/DL 161 (L) 096 (L) 045 (L) 472 (L) 492 (L) 737     I suspect his low levels are due to acute illness as they are often checked when he is actively ill, or has had a lot of steroids. IgA and IgM are within normal limits.    Over the past 2 years, he is continued to get ill within a week or 2 of discontinuing antibiotic therapy.  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 Prophylactic antimicrobial management with Doxycycline has been beneficial for him over past 3 months. He had 1 episode of pneumonia requiring higher-strength Levofloxacin; hospitalization subsequent to this infection was for CHF and fluid overload.     Plan:  -Continue doxycycline 100 mg daily  - Recommend getting the pneumovax immunization next time he is in clinic in person and will need Repeat pneumococcal antibody levels 1 month afterwards.          ? Moderate persistent asthma without complication 02/25/2016     He has had asthma since sometime in his 20-30s. He gets cough, chest tightness, wheezing, shortness of breath that is year round with worsening in the Spring and Fall.   Triggers: URI, hot/cold air. Additionally he has been diagnosed with bronchiectasis and emphysema; his PFTs suggest both obstructive and restrictive disease and asthma is unlikely to be the sole cause of his dyspnea.    He has negative IgE immunocaps to aeroallergens which makes the possibility of allergic asthma highly unlikely. His IgE level was 17 and he had negative ANCAs, MPO and PR3.     Repeatedly being treated with steroids now for flares.  Also on hypertonic saline 4 times a day, duo nebs, Symbicort, and Singulair as well as aggressive pulmonary clearance.    - Continue use of Aerobika  - Start use of EMST 150 Expiratory Muscle Strength Trainer  - Continue following with Dr. [name] for continued management.     ? 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 He is not having issues with urticaria.    - We recommend IgE immunocaps to aeroallergens.      ? Chronic Rhinoconjunctivitis 02/25/2016     Perennial, since childhood.  Nasal sprays are not helpful (Flonase, Nasacort) and patient had incorrect technique with sprays with resultant nosebleeds.  AIT several times in his life, only the first round was helpful.  02/2016 - CT sinuses normal.  02/2016 - Aeroallergen IgE negative. Currently on Zyrtec 10mg  daily, flunisolide, and Singulair 10mg  daily.     - Continue current management. He would not be a good candidate for further testing and allergy immunotherapy.  - pt feels his is stable and doing well on this regiment.      ? 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 Etiologies include likely atopic disease, but other considerations include vasculitidies such  as EGPA. Less likely parasitic infection as no bothersome GI symptoms or serpiginous rashes.    - We recommend repeating CBC w/ diff.   - We will obtain ANCAs     ? Recurrent infections 02/25/2016     He has had 5-10 rounds of antibiotics per year for sinus infections and bronchitis. He was previously having recurrent pneumonias (3-4 times a year for 5 years). Additionally he has had cellulitis in LE requiring hospitalization and IV Abx. Although these infections could be explained by underlying disease processes (obstructive/restrictive lung diseases and venous stasis requiring vein stripping), we did an immune evaluation and found hypgammaglobulinemia (IgG) with normal protective vaccine responses. IgG returns to near normal when the patient is well and not on steroids.    > Since Dec of 2019 he has not had any more infections whether skin or lung related. Continue to be on suppressive doxycyline      ? Postoperative visit 02/23/2016   ? S/P left pulmonary artery pressure sensor implant placement (CardioMEMs)  02/02/2016     * Patient has CardioMEMs (Pulmonary Artery Pressure Sensor)* Please call Matthew Folks, CardioMEMs Program Coordinator [phone] or page Heart Failure Rounding Team if patient is admitted or presents to ED*  03/30/15 implant     ? Ischemic cardiomyopathy 01/12/2016   ? Other male erectile dysfunction 01/12/2016   ? Wound of skin 01/12/2016   ? 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 Varicose veins of left lower extremity with pain 01/05/2016 ? ICD (implantable cardioverter-defibrillator), biventricular, in situ 12/07/2015   ? Hypercholesterolemia 11/25/2015   ? 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 Bronchiectasis without complication (HCC) 06/18/2015     Non-sputum producer.  Currently on Symbicort.  - Dr. [name] was able to get him a vest to wear at home for secretions and this has helped immensely with his secretions and breathing.      ? Abnormal cortisol level 12/25/2014   ? Venous stasis dermatitis of both lower extremities 11/04/2014   ? Sacroiliac dysfunction 07/28/2014   ? Osteoarthritis of spine with radiculopathy, lumbar region 07/28/2014   ? Chronic combined systolic and diastolic congestive heart failure, NYHA class 2 (HCC) 07/19/2014     11/04/14: EF 20%, severely dilated LV with grade 1 diastolic dysfunction.  11/12/2014 In clinic today, patient appears fairly well compensated.  He does have some LEE, but no significant rales (just some at bases), no JVD sitting upright, and no sx to suggest decompensation.  We will continue metoprolol, spironolactone, and bumex at current doses.    11/17/2014 Edema much improved in lower ext, Cr trending down on last labs.  Will recheck labs today.  Advised to weigh himself daily.  Rechecking BMP today to ensure not overdoing diuretics.     On metoprolol, spironolactone.  Will need to explore allergy to ARB more as patient may benefit from ACEI.      03/30/15 CardioMEMS implant by Dr. Chales Abrahams  1. Normal cardiac output and cardiac index.  2. Normal pulmonary pressures and normal pulmonary capillary wedge pressure.  3. Successful insertion of CardioMEMS pulmonary artery pressure sensor ? Chronic  total occlusion of native coronary artery 03/09/2014   ? 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      ? CKD (chronic kidney disease) 08/26/2013   ? Mixed dyslipidemia 08/26/2013     Hepatic Function    Lab Results   Component Value Date/Time    ALBUMIN 4.0 11/04/2014 12:26 AM    TOTAL PROTEIN 6.9 11/04/2014 12:26 AM    ALK PHOSPHATASE 61 11/04/2014 12:26 AM    Lab Results   Component Value Date/Time    AST (SGOT) 41* 11/04/2014 12:26 AM    ALT (SGPT) 19 11/04/2014 12:26 AM    TOTAL BILIRUBIN 1.4* 11/04/2014 12:26 AM        Lab Results   Component Value Date    CHOL 148 12/15/2013    TRIG 122 12/15/2013    HDL 51 12/15/2013    LDL 88 12/15/2013    VLDL 24 12/15/2013    NONHDLCHOL 97 12/15/2013       BP Readings from Last 3 Encounters:   11/12/14 129/80   11/09/14 111/44   11/04/14 146/65     Plan:   78 y.o. with known CAD with CTO of RCA and obtuse marginal branch with high grade stenosis (90%) in mid left circ.    Advised heart healthy diet and daily exercise.    Continue high intensity statin, atorvastatin       ? AAA (abdominal aortic aneurysm) (HCC) 10/15/2012     Duplex done at OSH in 2007 and 2008 ~ 4.1 cm    Duplex 10/15/12-AAA 7.3 cm AP x 7.3 cm       ? History of MRSA infection 10/14/2012   ? Chronic cough 08/29/2012   ? CAD (coronary artery disease), native coronary artery 08/15/2012     07/11/2002- Cath @ [facility] showed Severe double vessel disease(OMB of circumflex and distal circ)  inferior-basilar dyskinesis.                  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    09/12/12   Cath - [place]:  CTO of the right coronary artery, CTO of the obtuse marginal branch and high-grade stenosis of   90% in the mid left circumflex artery No significant stenosis in the left anterior descending artery or left main vessel     Failed attempt in opening 2nd obtuse marginal CTO as described above.    10/14/12: Unsuccessful attempt to open CTO of OMB and mid-CFX by Dr. Mackey Birchwood      04/02/17: Cath by Dr. Steward Ros:   4. Chronic total occlusion of the circumflex.  5. Chronic total occlusion of the right coronary artery, status post percutaneous coronary intervention with drug-eluting stent times 2.  6. Small distal right coronary artery perforation without echo or hemodynamic evidence of compromise.    06/29/17-cardiac catheterization by Dr. Steward Ros. One DES placed to chronic total occlusion of the circumflex artery. The RCA stent was patent.     ? OSA on CPAP 08/13/2012  CPAP at  DME: [facility]    Split night:  08/14/2017  AHI 43.2  REM n/a  Time below 88 %: 98 mins  Lowest sat: 83%     CPAP 10 cm h20 effective      ? COPD (chronic obstructive pulmonary disease) (HCC) 08/13/2012     04/28/13: PFTs with more restrictive pattern with FEV1 50%, FEV1/FVC 89%, DLCO 65%.    12/27/13: PFTs with FEV1 82%, FEV1/FVC 113%, and DLCO 77%     ? GERD (gastroesophageal reflux disease) 08/13/2012     -PPI  -follows with GI     ? 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  Discussed the importance of diet.  Exercise as tolerated.        ? Essential hypertension 08/13/2012   ? Chest wall deformity 07/09/2012 Chest wall reconstruction on 07/09/12  Lung herniation- bio bridge         Review of Systems   Constitution: Negative.   HENT: Negative.    Eyes: Negative.    Cardiovascular: Negative.    Respiratory: Negative.    Endocrine: Negative.    Hematologic/Lymphatic: Negative.    Skin: Negative.    Musculoskeletal: Negative.    Gastrointestinal: Negative.    Genitourinary: Negative.    Neurological: Negative.    Psychiatric/Behavioral: Negative.    Allergic/Immunologic: Negative.    All other systems reviewed and are negative.    Physical Exam  General Appearance: morbidly obese, no acute distress  Skin: warm, dry  HEENT: EOMI, mucous membranes moist, oropharynx is clear  Neck Veins: difficult to assess given his body habitus  Chest Inspection: chest is normal in appearance  Auscultation/Percussion: lungs clear to auscultation, diminished in bases, no rales, rhonchi, or wheezing  Cardiac Rhythm: regular rhythm & normal rate  Cardiac Auscultation: distant heart tones, but otherwise normal S1 & S2, no S3 or S4, no rub  Murmurs: no cardiac murmurs ?  Extremities: 1-2+ left, 1+ right lower extremity edema. Chronic skin and venous stasis changes.  Abdominal Exam: obese, non-tender, no masses, bowel sounds+, nonpalpable abdominal aorta  Liver & Spleen: difficult to assess due to obese body habitus  Neurologic Exam: oriented to time, place and person; no focal neurologic deficits    Cardiovascular Studies  Echocardiogram 05/03/2017: LVEF 40-45%, LV mildly dilated with eccentric hypertrophy.  Normal RV size and function.  PASP 37 mmHg.    LHC 06/29/17  1. A 100% chronic total occlusion of the left circumflex obtuse marginal-2, now status post balloon angioplasty and drug-eluting stent 2.5 x 38 Xience Sierra, postdilated with a 3.0 NC balloon distally and a 3.5 NC balloon proximally.  2. Patent stent in the proximal mid RCA.  3. A 30% distal left main area stenosis with circumferential calcium noted on the OCT. 4. Hemostasis achieved with Angio-Seal device in the right common femoral artery access.    Echocardiogram 12/28/2017: LV mildly dilated, moderate concentric hypertrophy, LVEF 45% with LVIDD 5.83 cm.  IVS 1.42 cm.  RV normal size and ejection fraction.  LA mildly dilated.  CVP 10-15 mmHg.  Trace poly-valvular regurgitation.  PASP 40 mmHg.    Echocardiogram 12/21/2018: LV not well seen, moderately dilated, LVEF 51%, grade 1 LV diastolic dysfunction.  RV not well seen, normal size, wall thickness and systolic function.  LA mildly dilated.  CVP 5-10 mmHg.  Mild?severe MVR, mild TVR.  PASP 37 mmHg.      Daily CardioMEMS Readings  ?  Goal PA Diastolic: 11-13 mmHg   PA Diastolic Thresholds: 9-15 mmHg  ?  ?  Taken on PA Systolic PA Diastolic PA Mean Heart Rate   [phone], 06:15 AM 34 mmHg 14 mmHg 21 mmHg 108 bpm   12-25-2018, 08:32 AM 25 mmHg 5 mmHg 13 mmHg 77 bpm   12-24-2018, 09:35 AM 32 mmHg 7 mmHg 15 mmHg 78 bpm   12-23-2018, 10:32 AM 33 mmHg 12 mmHg 18 mmHg 63 bpm   12-20-2018, 12:38 PM 33 mmHg 11 mmHg 18 mmHg 120 bpm   12-04-2018, 02:26 PM 26 mmHg 11 mmHg 16 mmHg 128 bpm   12-02-2018, 07:32 AM 30 mmHg 11 mmHg 18 mmHg 116 bpm   11-28-2018, 09:54 AM 33 mmHg 12 mmHg 19 mmHg 98 bpm   11-27-2018, 12:29 PM 28 mmHg 11 mmHg 17 mmHg 105 bpm   11-25-2018, 09:33 AM 25 mmHg 9 mmHg 14 mmHg 102 bpm   11-24-2018, 08:36 AM 31 mmHg 10 mmHg 17 mmHg 98 bpm   11-21-2018, 09:16 AM 25 mmHg 9 mmHg 14 mmHg 104 bpm   11-20-2018, 04:16 PM 30 mmHg 11 mmHg 17 mmHg 104 bpm   11-19-2018, 02:00 PM 25 mmHg 11 mmHg 15 mmHg 109 bpm   11-18-2018, 08:51 AM 25 mmHg 9 mmHg 14 mmHg 99 bpm   11-17-2018, 08:56 AM 26 mmHg 10 mmHg 16 mmHg 94 bpm   11-15-2018, 03:24 PM 29 mmHg 12 mmHg 17 mmHg 110 bpm             Basic Metabolic Profile    Lab Results   Component Value Date/Time    NA 137 12/26/2018 05:19 AM    K 4.6 12/26/2018 05:19 AM    CA 8.8 12/26/2018 05:19 AM    CL 104 12/26/2018 05:19 AM    CO2 24 12/26/2018 05:19 AM    GAP 9 12/26/2018 05:19 AM Lab Results   Component Value Date/Time    BUN 52 (H) 12/26/2018 05:19 AM    CR 1.39 (H) 12/26/2018 05:19 AM    GLU 182 (H) 12/26/2018 05:19 AM            Problems Addressed Today  No diagnosis found.    Assessment and Plan     1. Chronic combined systolic and diastolic heart failure?with improved ejection fraction 51% on most recent echo 12/21/2018 (previously as low as?25% with grade 1 left ventricular diastolic dysfunction 10/27/2015). ?He does have CardioMEMS PA sensor monitor (placed 04/09/15) with CardioMEMS PAD reading today of 14, with goal PAD of 11?13 and threshold of 9?15. Currently has New York Heart Association class III symptoms, stage C. ?He will continue with Bumex 2?mg daily, Spironolactone 25 mg twice daily.  His metoprolol and losartan were discontinued in the past due to hypotension. ?He will refocus on?low-sodium diet and 2 L fluid restriction.   2. CKD, stage III and hyponatremia.  Baseline creatinine 1.3?1.7. BoMP from 11/5 reveals creatinine at 1.39.  BMP will be repeated today.  3. Ischemic cardiomyopathy with LVEF as above. ?He will continue spironlactone as above. He has been off of metoprolol XL and?losartan due to?hypotension and lightheadedness in the past.   4. Orthostatic hypotension.  Improved.  His midodrine was discontinued during his hospitalization in October 2020.  5. CAD. LHC 5/1 with  DES to the CTO left circumflex by Dr. Alden Server.  Prior left cardiac catheterization 04/02/17 with DES x2 to the RCA w/ small distal RCA perforation, trace pericardial effusion; known total occlusion of the CX.  Left main and LAD have 30-40% dz. Continue ASA 81 mg, Plavix 75 mg, atorvastatin 40 mg daily.  6. Status post CRT-D placement On 11/25/2015 by Dr. Betti Cruz. ?He continues to have regular schedule device interrogations. In hospital device interrogation 12/23/2018 revealed normal functioning device with 2 short AT events noted.  PVC count 2.7%.  CRT pacing 95%. 7. L lower extremity cellulitis, edema and varicose veins. Left lower extremity venous Doppler 12/12 was negative for DVT.  Continue with Keflex therapy as directed by Dr. Crecencio Mc.  He previously has had right lower extremity endovenous radiofrequency ablation of the right small?saphenous vein on 5/17 followed by RLE venous doppler on 5/22. He follows with Dr. Junita Push.    8. Morbid obesity. ?He had lost a significant amount of weight through the?[facility] clinic directed by Dr. Vita Barley. ?Unfortunately he regained this weight after going off the program November 2017.  He and his wife continue to work on weight loss.  9. Infrarenal abdominal aortic aneurysm, post endovascular repair, September of 2014.? He follows with Dr. 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  He has a follow-up appointment with Dr. Elizabeth Palau on 11/25 with his annual CT prior.  10. Chronic bronchiectasis.  He currently follows with Dr. Lambert Mody and in the allergy clinic.  He is on maintenance antibiotic prophylaxis with doxycycline and prednisone is tapered down to 20 mg orally.    11. Recent UTI during hospitalization.  He is finishing his course of Bactrim.  12. COVID-19 positive requiring hospitalization 10/14?12/15/2018.  He was treated with remdesivir, convalescent plasma, and dexamethasone.      Ruben Wood will follow-up with Dr. Elizabeth Palau on 11/25, heart failure nurse practitioner Herby Abraham on 12/4 in video telehealth visit, and Dr. Vanetta Shawl on 12/11.  We will monitor his CardioMEMS PAD readings closely with adjustments in Bumex dosing accordingly. ?He has been encouraged to contact us prior with any questions, concerns, or increasing symptoms.  ?  Thank you for allowing me to participate in the care of this patient. If you have any questions please do not hesitate to contact our office. Bunnie Philips, ANP-BC, MSN, [facility] - CVM Advance Practice Provider  Heart Failure APP Coordinator  [facility] [place] [facility]   Phone [phone]  - Fax [phone] - [email or web address]  [address], [place], [place] [address]  Collaborating Physician [name]      _________________________________________________________________________________  DISEASE PREVENTION:   Lipids/Statin treatment: Lipitor (atorvastatin)  Lab Results   Component Value Date/Time    CHOL 106 12/05/2018 01:57 AM    TRIG 108 12/05/2018 01:57 AM    HDL 31 (L) 12/05/2018 01:57 AM    LDL 60 12/05/2018 01:57 AM    VLDL 22 12/05/2018 01:57 AM    NONHDLCHOL 75 12/05/2018 01:57 AM    CHOLHDLC 2.4 08/12/2018     Goal LDL < 70, Triglycerides < 130 mg/dl.     HTN: controlled. Goal Systolic < 130, diastolic < 90.     Diabetes:  Yes   Lab Results   Component Value Date/Time    HGBA1C 6.3 (H) 12/05/2018  01:57 AM    HGBA1C 6.7 (H) 08/12/2018    HGBA1C 6.8 (H) 04/03/2017 04:39 AM    Follows with PCP for goal A1c < 7.0    Tobacco: denies.      Obesity/Nutrition/Physical Activity: Body mass index is 39.75 kg/m?[name] Discussed exercise recommendation of 30 min most days of the week and diet management with emphasis on vegetables, fruit and lean meat.  ____________________________________________________________________________________      ?         Current Medications (including today's revisions)  ? acetaminophen (TYLENOL) 325 mg tablet Take two tablets by mouth every 6 hours as needed for Pain.   ? albuterol 0.083% (PROVENTIL; VENTOLIN) 2.5 mg /3 mL (0.083 %) nebulizer solution Inhale 3 mL solution by nebulizer as directed every 6 hours as needed for Wheezing or Shortness of Breath. Indications: asthma   ? albuterol sulfate (PROAIR HFA) 90 mcg/actuation aerosol inhaler Inhale two puffs by mouth into the lungs every 4 hours as needed for Wheezing or Shortness of Breath. ? allopurinoL (ZYLOPRIM) 300 mg tablet Take one tablet by mouth daily. Take with food.   ? AMITR/GABAPEN/EMU OIL 05/24/08% CREAM (COMPOUND) Apply 5 grams (10 pumps) topically to affected area three times daily.   ? Ammonium Lactate-Emu Oil (EMU-LAC) 10 % crea Apply  topically to affected area. Apply to feet    ? ascorbic acid (VITAMIN C) 500 mg tablet Take one tablet by mouth daily.   ? aspirin EC 81 mg tablet Take one tablet by mouth daily. Take with food.   ? atorvastatin (LIPITOR) 40 mg tablet TAKE (1) TABLET BY MOUTH ONCE DAILY   ? benzonatate (TESSALON PERLES) 100 mg capsule Take two capsules by mouth every 8 hours as needed for Cough.   ? budesonide/formoterol (SYMBICORT HFA) 160/4.5 mcg inhalation Inhale two puffs by mouth into the lungs twice daily. Rinse mouth after use.   ? bumetanide (BUMEX) 2 mg tablet Take one tablet by mouth daily. Take extra dose for swelling   ? cholecalciferol (VITAMIN D-3) 1,000 units tablet Take 1,000 Units by mouth daily.   ? docusate (COLACE) 100 mg capsule Take 100 mg by mouth daily as needed for Constipation.   ? doxycycline (MONODOX) 100 mg capsule Take one capsule by mouth daily.   ? duloxetine DR (CYMBALTA) 60 mg capsule Take one capsule by mouth daily.   ? finasteride (PROSCAR) 5 mg tablet Take one tablet by mouth daily.   ? fish oil- omega 3-DHA/EPA 300/1,000 mg capsule Take 1 capsule by mouth daily.   ? gabapentin (NEURONTIN) 400 mg capsule Take 1 capsule by mouth three times daily as needed.   ? ipratropium bromide (ATROVENT) 42 mcg (0.06 %) nasal spray 1 spray to each nostril Q6H PRN   ? montelukast (SINGULAIR) 10 mg tablet Take one tablet by mouth at bedtime daily.   ? nitroglycerin (NITROSTAT) 0.4 mg tablet Place 0.4 mg under tongue every 5 minutes as needed for Chest Pain. Max of 3 tablets, call 911.   ? pantoprazole DR (PROTONIX) 40 mg tablet TAKE (1) TABLET BY MOUTH TWO TIMES A DAY.   ? polyethylene glycol 3350 (MIRALAX) 17 g packet Take one packet by mouth as Needed.   ? predniSONE (DELTASONE) 10 mg tablet Take three tablets by mouth daily. For four days, then 2 tablets daily until further directed by pulmonology   ? sodium chloride 3 % nebulizer solution Inhale 4 mL by mouth into the lungs twice daily. Use with [place].  Indications: asthma, chronic  cough, bronchiectasis (Patient taking differently: Inhale 4 mL by mouth into the lungs twice daily as needed. Use with [place].  Indications: asthma, chronic cough, bronchiectasis)   ? spironolactone (ALDACTONE) 25 mg tablet Take one tablet by mouth twice daily. Take with food.   ? tamsulosin (FLOMAX) 0.4 mg capsule Take 1 capsule by mouth daily.   ? trimethoprim/sulfamethoxazole (BACTRIM DS) 160/800 mg tablet Take one tablet by mouth three times weekly.   ? zinc oxide 20 % topical ointment Place twice a day on the buttock wound

## 2018-12-31 NOTE — Telephone Encounter
Patient's wife called stating patient has bed sores to his buttock area.  [facility] but that's not healing the sores.  Asking if she should use bacitracin zinc ointment or if Dr. Gibson Ramp had any other suggestions.    Routing to Dr. Gibson Ramp to advise  Myrtie Hawk, RN

## 2018-12-31 NOTE — Progress Notes
Daily CardioMEMS Readings    Goal PA Diastolic: 01-75 mmHg   PA Diastolic Thresholds: 1-02 mmHg      Taken on PA Systolic PA Diastolic PA Mean Heart Rate   12-31-2018, 06:15 AM 34 mmHg 14 mmHg 21 mmHg 108 bpm   12-25-2018, 08:32 AM 25 mmHg 5 mmHg 13 mmHg 77 bpm   12-24-2018, 09:35 AM 32 mmHg 7 mmHg 15 mmHg 78 bpm   12-23-2018, 10:32 AM 33 mmHg 12 mmHg 18 mmHg 63 bpm   12-20-2018, 12:38 PM 33 mmHg 11 mmHg 18 mmHg 120 bpm   12-04-2018, 02:26 PM 26 mmHg 11 mmHg 16 mmHg 128 bpm   12-02-2018, 07:32 AM 30 mmHg 11 mmHg 18 mmHg 116 bpm   11-28-2018, 09:54 AM 33 mmHg 12 mmHg 19 mmHg 98 bpm   11-27-2018, 12:29 PM 28 mmHg 11 mmHg 17 mmHg 105 bpm   11-25-2018, 09:33 AM 25 mmHg 9 mmHg 14 mmHg 102 bpm   11-24-2018, 08:36 AM 31 mmHg 10 mmHg 17 mmHg 98 bpm   11-21-2018, 09:16 AM 25 mmHg 9 mmHg 14 mmHg 104 bpm   11-20-2018, 04:16 PM 30 mmHg 11 mmHg 17 mmHg 104 bpm   11-19-2018, 02:00 PM 25 mmHg 11 mmHg 15 mmHg 109 bpm   11-18-2018, 08:51 AM 25 mmHg 9 mmHg 14 mmHg 99 bpm   11-17-2018, 08:56 AM 26 mmHg 10 mmHg 16 mmHg 94 bpm   11-15-2018, 03:24 PM 29 mmHg 12 mmHg 17 mmHg 110 bpm   11-14-2018, 09:15 AM 27 mmHg 9 mmHg 15 mmHg 99 bpm   11-13-2018, 04:18 PM 26 mmHg 9 mmHg 15 mmHg 81 bpm   11-12-2018, 03:39 PM 27 mmHg 10 mmHg 16 mmHg 96 bpm   11-10-2018, 11:35 AM 30 mmHg 9 mmHg 16 mmHg 90 bpm   11-09-2018, 11:03 AM 30 mmHg 10 mmHg 16 mmHg 87 bpm   11-08-2018, 10:19 AM 31 mmHg 10 mmHg 17 mmHg 90 bpm   11-07-2018, 09:27 AM 27 mmHg 9 mmHg 14 mmHg 93 bpm   11-06-2018, 09:38 AM 28 mmHg 10 mmHg 16 mmHg 90 bpm   11-04-2018, 10:07 AM 27 mmHg 7 mmHg 14 mmHg 94 bpm   11-02-2018, 06:35 PM 32 mmHg 11 mmHg 18 mmHg 98 bpm   10-31-2018, 09:30 AM 27 mmHg 7 mmHg 14 mmHg 88 bpm   10-30-2018, 02:16 PM 28 mmHg 9 mmHg 15 mmHg 88 bpm

## 2019-01-01 ENCOUNTER — Encounter: Admit: 2019-01-01 | Discharge: 2019-01-01 | Payer: MEDICARE

## 2019-01-03 ENCOUNTER — Ambulatory Visit: Admit: 2019-01-03 | Discharge: 2019-01-04 | Payer: MEDICARE

## 2019-01-03 ENCOUNTER — Encounter: Admit: 2019-01-03 | Discharge: 2019-01-03 | Payer: MEDICARE

## 2019-01-03 DIAGNOSIS — I2510 Atherosclerotic heart disease of native coronary artery without angina pectoris: Secondary | ICD-10-CM

## 2019-01-03 DIAGNOSIS — R0600 Dyspnea, unspecified: Secondary | ICD-10-CM

## 2019-01-03 DIAGNOSIS — J302 Other seasonal allergic rhinitis: Secondary | ICD-10-CM

## 2019-01-03 DIAGNOSIS — R05 Cough: Secondary | ICD-10-CM

## 2019-01-03 DIAGNOSIS — J479 Bronchiectasis, uncomplicated: Secondary | ICD-10-CM

## 2019-01-03 DIAGNOSIS — J189 Pneumonia, unspecified organism: Secondary | ICD-10-CM

## 2019-01-03 DIAGNOSIS — Z8679 Personal history of other diseases of the circulatory system: Secondary | ICD-10-CM

## 2019-01-03 DIAGNOSIS — J4550 Severe persistent asthma, uncomplicated: Secondary | ICD-10-CM

## 2019-01-03 DIAGNOSIS — R609 Edema, unspecified: Secondary | ICD-10-CM

## 2019-01-03 DIAGNOSIS — K219 Gastro-esophageal reflux disease without esophagitis: Secondary | ICD-10-CM

## 2019-01-03 DIAGNOSIS — I429 Cardiomyopathy, unspecified: Secondary | ICD-10-CM

## 2019-01-03 DIAGNOSIS — I714 Abdominal aortic aneurysm, without rupture: Secondary | ICD-10-CM

## 2019-01-03 DIAGNOSIS — L03116 Cellulitis of left lower limb: Secondary | ICD-10-CM

## 2019-01-03 DIAGNOSIS — N401 Enlarged prostate with lower urinary tract symptoms: Secondary | ICD-10-CM

## 2019-01-03 DIAGNOSIS — Z8614 Personal history of Methicillin resistant Staphylococcus aureus infection: Secondary | ICD-10-CM

## 2019-01-03 DIAGNOSIS — U071 COVID-19 virus infection: Secondary | ICD-10-CM

## 2019-01-03 DIAGNOSIS — M954 Acquired deformity of chest and rib: Secondary | ICD-10-CM

## 2019-01-03 DIAGNOSIS — L89150 Pressure ulcer of sacral region, unstageable: Secondary | ICD-10-CM

## 2019-01-03 DIAGNOSIS — I10 Essential (primary) hypertension: Secondary | ICD-10-CM

## 2019-01-03 DIAGNOSIS — R399 Unspecified symptoms and signs involving the genitourinary system: Secondary | ICD-10-CM

## 2019-01-03 DIAGNOSIS — I5042 Chronic combined systolic (congestive) and diastolic (congestive) heart failure: Secondary | ICD-10-CM

## 2019-01-03 DIAGNOSIS — J45909 Unspecified asthma, uncomplicated: Secondary | ICD-10-CM

## 2019-01-03 DIAGNOSIS — N183 CKD (chronic kidney disease) stage 3, GFR 30-59 ml/min: Secondary | ICD-10-CM

## 2019-01-03 DIAGNOSIS — M4807 Spinal stenosis, lumbosacral region: Secondary | ICD-10-CM

## 2019-01-03 DIAGNOSIS — G4733 Obstructive sleep apnea (adult) (pediatric): Secondary | ICD-10-CM

## 2019-01-03 DIAGNOSIS — M961 Postlaminectomy syndrome, not elsewhere classified: Secondary | ICD-10-CM

## 2019-01-03 DIAGNOSIS — R0902 Hypoxemia: Secondary | ICD-10-CM

## 2019-01-03 DIAGNOSIS — I509 Heart failure, unspecified: Secondary | ICD-10-CM

## 2019-01-03 DIAGNOSIS — Z09 Encounter for follow-up examination after completed treatment for conditions other than malignant neoplasm: Secondary | ICD-10-CM

## 2019-01-03 DIAGNOSIS — E782 Mixed hyperlipidemia: Secondary | ICD-10-CM

## 2019-01-03 DIAGNOSIS — J9611 Chronic respiratory failure with hypoxia: Secondary | ICD-10-CM

## 2019-01-03 DIAGNOSIS — Z9981 Dependence on supplemental oxygen: Secondary | ICD-10-CM

## 2019-01-03 DIAGNOSIS — G933 Postviral fatigue syndrome: Secondary | ICD-10-CM

## 2019-01-03 DIAGNOSIS — Z95828 Presence of other vascular implants and grafts: Secondary | ICD-10-CM

## 2019-01-03 DIAGNOSIS — J449 Chronic obstructive pulmonary disease, unspecified: Secondary | ICD-10-CM

## 2019-01-03 MED ORDER — RXAMB AMITR/GABAPEN/EMU OIL 4/4/10% CREAM (COMPOUND)
Freq: Three times a day (TID) | TOPICAL | 11 refills | Status: DC
Start: 2019-01-03 — End: 2019-01-03

## 2019-01-03 MED ORDER — RXAMB AMITR/GABAPEN/EMU OIL 4/4/10% CREAM (COMPOUND)
Freq: Three times a day (TID) | TOPICAL | 11 refills | Status: DC
Start: 2019-01-03 — End: 2019-07-28
  Filled 2019-01-03: qty 50, 7d supply, fill #1

## 2019-01-03 MED ORDER — TAMSULOSIN 0.4 MG PO CAP
.4 mg | ORAL_CAPSULE | Freq: Every day | ORAL | 3 refills | 90.00000 days | Status: AC
Start: 2019-01-03 — End: ?

## 2019-01-03 NOTE — Progress Notes
History of Present Illness  Ruben Wood. is a 78 y.o. male with multiple medical problems including CAD, Systolic and Diastolic HF, HLD, OSA on CPAP, morbid obesity, chronic pain, and COPD who presents today for hospital discharge follow up.     Obtained patient's verbal consent to treat them and their agreement to [facility] financial policy and NPP via this telehealth visit during the [facility]    [name] has had 2 recent admissions for COVID-19.  During his hospital follow-up with me via telehealth, his oxygen was very low and he was quite ill.  We did have a 911 called and [facility] was [facility] department and was ultimately transferred to the [facility] intensive care unit.  Fortunately, he turned around relatively quickly on BiPAP and high FiO2.  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  He was started on dexamethasone and had a prolonged taper on that.    Today, he notes that he is feeling better overall, but still having profound fatigue.  Very minimal activity, such as even walking across the room or going from his car in the driveway to the front door really wiped him out.  His breathing is not back to baseline, but it is improving overall.  He is using his oxygen with any activity.  He is watching his oxygen levels closely.  He will be starting home physical therapy.    He has developed decubitus ulcers in the sacral region.  He denies any fevers or chills or drainage.  He has not had any recent urinary incontinence.  Wound care nurse will be coming out to the house later today for that.    He would like a refill of the tamsulosin.  He uses this for lower urinary tract symptoms and that has helped. He would also like a refill of the gabapentin/amitriptyline/emu oil cream for his feet.  This has been quite effective.        Review of Systems   Constitutional: Positive for fatigue. Negative for chills and fever.   HENT: Positive for congestion. Negative for ear discharge, ear pain, rhinorrhea and sore throat.    Respiratory: Positive for cough and shortness of breath. Negative for chest tightness.    Cardiovascular: Positive for leg swelling. Negative for chest pain.   Gastrointestinal: Negative for diarrhea, nausea and vomiting.   Skin: Positive for wound.       Objective:         ? acetaminophen (TYLENOL) 325 mg tablet Take two tablets by mouth every 6 hours as needed for Pain.   ? albuterol 0.083% (PROVENTIL; VENTOLIN) 2.5 mg /3 mL (0.083 %) nebulizer solution Inhale 3 mL solution by nebulizer as directed every 6 hours as needed for Wheezing or Shortness of Breath. Indications: asthma   ? albuterol sulfate (PROAIR HFA) 90 mcg/actuation aerosol inhaler Inhale two puffs by mouth into the lungs every 4 hours as needed for Wheezing or Shortness of Breath.   ? allopurinoL (ZYLOPRIM) 300 mg tablet Take one tablet by mouth daily. Take with food.   ? AMITR/GABAPEN/EMU OIL 05/24/08% CREAM (COMPOUND) Apply 5 grams (10 pumps) topically to affected area three times daily.   ? Ammonium Lactate-Emu Oil (EMU-LAC) 10 % crea Apply  topically to affected area. Apply to feet    ? ascorbic acid (VITAMIN C)  500 mg tablet Take one tablet by mouth daily.   ? aspirin EC 81 mg tablet Take one tablet by mouth daily. Take with food.   ? atorvastatin (LIPITOR) 40 mg tablet TAKE (1) TABLET BY MOUTH ONCE DAILY   ? benzonatate (TESSALON PERLES) 100 mg capsule Take two capsules by mouth every 8 hours as needed for Cough.   ? budesonide/formoterol (SYMBICORT HFA) 160/4.5 mcg inhalation Inhale two puffs by mouth into the lungs twice daily. Rinse mouth after use. ? bumetanide (BUMEX) 2 mg tablet Take one tablet by mouth daily. Take extra dose for swelling   ? cholecalciferol (VITAMIN D-3) 1,000 units tablet Take 1,000 Units by mouth daily.   ? docusate (COLACE) 100 mg capsule Take 100 mg by mouth daily as needed for Constipation.   ? doxycycline (MONODOX) 100 mg capsule Take one capsule by mouth daily.   ? duloxetine DR (CYMBALTA) 60 mg capsule Take one capsule by mouth daily.   ? finasteride (PROSCAR) 5 mg tablet Take one tablet by mouth daily.   ? fish oil- omega 3-DHA/EPA 300/1,000 mg capsule Take 1 capsule by mouth daily.   ? gabapentin (NEURONTIN) 400 mg capsule Take 1 capsule by mouth three times daily as needed.   ? ipratropium bromide (ATROVENT) 42 mcg (0.06 %) nasal spray 1 spray to each nostril Q6H PRN   ? montelukast (SINGULAIR) 10 mg tablet Take one tablet by mouth at bedtime daily.   ? nitroglycerin (NITROSTAT) 0.4 mg tablet Place 0.4 mg under tongue every 5 minutes as needed for Chest Pain. Max of 3 tablets, call 911.   ? nystatin (NYSTOP) 100,000 unit/g topical powder Apply  topically to affected area four times daily.   ? pantoprazole DR (PROTONIX) 40 mg tablet TAKE (1) TABLET BY MOUTH TWO TIMES A DAY.   ? polyethylene glycol 3350 (MIRALAX) 17 g packet Take one packet by mouth as Needed.   ? predniSONE (DELTASONE) 10 mg tablet Take three tablets by mouth daily. For four days, then 2 tablets daily until further directed by pulmonology   ? sodium chloride 3 % nebulizer solution Inhale 4 mL by mouth into the lungs twice daily. Use with [place].  Indications: asthma, chronic cough, bronchiectasis (Patient taking differently: Inhale 4 mL by mouth into the lungs twice daily as needed. Use with [place].  Indications: asthma, chronic cough, bronchiectasis)   ? spironolactone (ALDACTONE) 25 mg tablet Take one tablet by mouth twice daily. Take with food.   ? tamsulosin (FLOMAX) 0.4 mg capsule Take one capsule by mouth daily. ? trimethoprim/sulfamethoxazole (BACTRIM DS) 160/800 mg tablet Take one tablet by mouth three times weekly.   ? zinc oxide 20 % topical ointment Place twice a day on the buttock wound     There were no vitals filed for this visit.    There is no height or weight on file to calculate BMI.     Wt Readings from Last 5 Encounters:   01/03/19 129.3 kg (285 lb)   12/31/18 129.3 kg (285 lb)   12/21/18 127.5 kg (281 lb)   12/07/18 130.5 kg (287 lb 9.6 oz)   10/01/18 132 kg (291 lb)       Physical Exam  Constitutional:       Comments: Frail appearing   HENT:      Head: Normocephalic and atraumatic.   Pulmonary:      Effort: Pulmonary effort is normal.   Neurological:      General: No focal deficit present.  Mental Status: He is alert.   Psychiatric:         Mood and Affect: Mood normal.       Labs Reviewed:   Lab Results   Component Value Date/Time    HGBA1C 6.3 (H) 12/05/2018 01:57 AM    HGBA1C 6.7 (H) 08/12/2018    HGBA1C 6.8 (H) 04/03/2017 04:39 AM    HGBPOC 11.2 (L) 10/25/2012 10:58 AM    HGBPOC 12.9 (L) 10/25/2012 08:55 AM    TSH 4.37 12/20/2018 08:09 PM    FREET4R 0.99 12/15/2014 09:12 AM    CHOL 106 12/05/2018 01:57 AM    TRIG 108 12/05/2018 01:57 AM    HDL 31 (L) 12/05/2018 01:57 AM    LDL 60 12/05/2018 01:57 AM    NA 139 12/31/2018 12:04 PM    K 4.0 12/31/2018 12:04 PM    CL 104 12/31/2018 12:04 PM    CO2 26 12/31/2018 12:04 PM    GAP 9 12/31/2018 12:04 PM    BUN 37 (H) 12/31/2018 12:04 PM    CR 1.30 (H) 12/31/2018 12:04 PM    GLU 124 (H) 12/31/2018 12:04 PM    CA 9.3 12/31/2018 12:04 PM    PO4 3.2 12/21/2018 10:34 PM    ALBUMIN 2.9 (L) 12/26/2018 05:19 AM    TOTPROT 5.7 (L) 12/26/2018 05:19 AM    ALKPHOS 69 12/26/2018 05:19 AM    AST 22 12/26/2018 05:19 AM    ALT 38 12/26/2018 05:19 AM    TOTBILI 0.3 12/26/2018 05:19 AM    GFR 53 (L) 12/31/2018 12:04 PM    GFRAA >60 12/31/2018 12:04 PM    PSA 2.96 11/25/2018         Assessment and Plan:    1. Hospital discharge follow-up    2. COVID-19 3. Lower urinary tract symptoms (LUTS)    4. Hypoxia    5. Postviral fatigue syndrome    6. Pressure injury of sacral region, unstageable (HCC)    7. Spinal stenosis of lumbosacral region      Overall, he seems to be moving in the right direction from a Covid recovery standpoint.  He still has profound fatigue and his breathing is not back to normal.  However, I think he is in the realm of what we would expect at this stage of his recovery.  I want him to start with physical therapy as he is already had that set up and will start tomorrow.  He will continue using the oxygen with activity.  I did talk to him at length about the expected recovery phase.    He has developed what sounds like decubitus ulcers.  Unfortunately, it was too difficult to assess these via telehealth format.  We did set him up for wound care with a home health nurse, so we will have to rely on their assessment.    I did provide a refill of tamsulosin as well as the gabapentin/amitriptyline/emu oil cream.    RTC: 1 month for comprehensive follow-up      There are no Patient Instructions on file for this visit.    No follow-ups on file.

## 2019-01-03 NOTE — Progress Notes
Obtained patient's verbal consent to treat them and their agreement to [place] financial policy and NPP via this telehealth visit during the Coronavirus Public Health Emergency

## 2019-01-03 NOTE — Progress Notes
Did patient read financial policy, consent to treat, and notice of privacy practices? Yes    Does the patient give verbal consent to each policy? Yes    Does the patient have any vitals to report? Yes    N/A Vitals charted in O2? No    Is the patient in pain? 6/10    Screening questions completed? Yes    Is the patient able to acces the Mychart message with the start visit link? Yes    Is patient in "virtual waiting room" Yes

## 2019-01-03 NOTE — Progress Notes
Telehealth Visit Note    Date of Service: 01/03/2019    Subjective:      Obtained patient's verbal consent to treat them and their agreement to [name] financial policy and NPP via this telehealth visit during the [facility]       Ruben Wood. Ruben Wood is a 78 y.o. male.    History of Present Illness    Pt being seen for post-hospital follow-up appointment.  See hospital discharge information below.  Pt currently on prednisone 20 mg.  He reports his breathing is still a struggle.  Notes some shortness with exertion.  Pulse ox is 93 to 94% at rest.  He is using 4 liters with exertion.  Pt is currently on 20 mg of prednisone.  Minimal cough.  No chest pain with deep inspiration.  Leg cellulitis better.  Minimal pain with urination.  No fevers or chills.  Some insomnia with prednisone.  Pt saw Ruben Wood on Tuesday. CardioMEMs was 14.   He took extra Bumex on Wednesday.  Pt has restarted doxycycline.    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 Notably he missed several days of diuretics while in an inpatient rehab facility the prior week. On admission febrile and requiring 60L O2/min 100% FiO2. ?He was treated with IV diuresis, ceftriaxone for a suspected UTI and vancomycin for suspected left lower extremity cellulitis. ?He was was started on a prolonged high dose dexamethasone taper due to concern for a post-covid fibroproliferative pulmonary disorder. ?He was stabilized and transferred to the floor 11/1.   ? 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 Pt with edema and possible CHF, however, still with hypoxia after adequate diuresis  ?  CT Chest on 11/2: Decrease in patchy, mid and lower lung predominant groundglass opacities within both lungs, with increasing areas of reticulation, traction bronchiectasis, and architectural distortion in the areas, compatible with developing fibrosis. Again, findings are likely related to reported prior COVID 19 infection. Mild cardiomegaly and at least moderate coronary artery calcification.   ?  Pt started on dexamethasone due to concern for fibrosis related to covid, transitioned to prednisone tomorrow per pulm recs. Pt will take 30mg  for total 7 days, then 20mg  until follow up with pulm as outpt.   ?  Pt satting well on 2-3 L oxygen at rest, 4L needed on ex ox with RT. CM to arrange home oxygen. Will need restart doxycycline on discharge. Pulm consult followed during admission and will follow as outpt. Cont bipap QHS, albuterol and atrovent nebs, symbicort and hypertonic saline nebs. Cont PJP prophylaxis while on high dose steroid.   ?  2)ID- UTI, cellulitis  Pt treated with vancomycin and rocephin. Stopped on 11/5, will monitor off antibiotics for sx of recurrence. Once pt improved from respiratory status, could consider outpt evaluation by vascular surgery, will defer to PCP to arrange in future. ID followed during admission, appreciate assistance. Cont amitryptiline/gaba/emu oil for legs  ? Art [place] Right leg: Interval development of moderate to moderately severe predominantly calcified plaque in the distal right superficial femoral artery with spectral findings compatible with 50-70% stenosis and minimal findings of arterial insufficiency distally; Left  leg Tortuosity and collaterals in the mid superficial femoral artery with mild arterial insufficiency changes distally without visualization of a focal critical stenosis Interval development of abnormally low arterial resistance throughout the trifurcation and dorsalis pedis arteries with no focal stenosis identified. Findings could be secondary to hyperemia associated with wounds or other sources of infection. Mild superimposed arterial insufficiency could also contribute to these findings   ?  3)Cardio-  coronary disease status post stenting, heart failure with reduced ejection fraction, ischemic cardiomyopathy status post AICD, AAA status post EVAR in 2014, hyperlipidemia  Pt aggressively diuresed with bumex. Daily CardioMEMs on 11/2 PAD is 12 mmHg; goal PAD 11-13 mmHg, reading on 11/4 was 5. Cont asa, fish oil, bumex 2mg  daily, sprinolactone 25mg  bid. CHF team following, appreciate their assistance  ?  4)Renal-CKD III, BPH  -cont finasteride and flomax  ?  5)FEN- cont vitamin B 12 injection for deficiency  ?  6)GI- cont PPI while on steroids    CT Chest from 11-2:  CT Chest     Clinical Indication: ?Evaluate coated fibrosis     Technique: Multiple contiguous axial CT images were obtained through the   chest without IV contrast. Post processing coronal and sagittal   reconstruction images were made from the axial images.     IV contrast: None.     Comparison: CT chest 12/14/2018     Findings:     Evaluation of the mediastinum and hila, including the vasculature and for   lymphadenopathy, is limited without the use of IV contrast.     Axilla, Mediastinum and Hila: No axillary, mediastinal, or hilar   adenopathy. Heart and Great Vessels: Heart is mildly enlarged without significant   pericardial effusion. Cardiac conduction device remains in place. At least   moderate coronary artery calcification. CardioMEMs device again seen   within the left lower lobe pulmonary artery.     Airway, Lungs and Pleura: Mild emphysema. Decrease in previously seen mid   and lower lung predominant groundglass opacities, with increasing   reticulation, traction bronchiectasis, and architectural distortion in   these areas. No new consolidation or groundglass opacity. No pleural   effusion.[name]     Upper Abdomen: Surgical clips in the gallbladder fossa. Partial   visualization of a left lateral abdominal wall hernia.     Chest Wall and Osseous Structures: Mild bilateral gynecomastia. Thoracic   spondylosis. Old rib fractures. No aggressive osseous lesions.     IMPRESSION     1. Decrease in patchy, mid and lower lung predominant groundglass   opacities within both lungs, with increasing areas of reticulation,   traction bronchiectasis, and architectural distortion in the areas,   compatible with developing fibrosis. Again, findings are likely related to   reported prior COVID 19 infection.   2. Mild cardiomegaly and at least moderate coronary artery calcification.       ?Finalized by Francis Dowse, M.D. on 12/24/2018 7:58 AM. Dictated by Francis Dowse, M.D. on 12/24/2018 7:47 AM.        Review of Systems  Baseline shortness of breath and marked fatigue.  Feels globally weak as well.  Remainder of 14 point ROS discussed and reported as negative.    Results for Ruben Wood, Ruben Wood ED (MRN [id number]) as of 03/09/2018 12:53  ? Ref. Range 05/21/2017 04:27 07/02/2017 19:00 08/28/2017 17:18   Absolute Eosinophil Count Latest Ref Range: 0 - 0.45 K/UL 0.10 0.10 0.40   ?  Results for Ruben Wood, Ruben A JR.  ED (MRN [id number]) as of 03/09/2018 12:53  ? Ref. Range 02/25/2016 16:10 09/14/2016 12:57 10/06/2016 12:21 02/28/2017 16:15 04/19/2017 11:45 05/05/2017 04:01 09/11/2017 13:56 C-ANCA Latest Units: TITER <20,NEGATIVE ? ? ? ? ? ?   P-ANCA Latest Units: TITER <20,NEGATIVE ? ? ? ? ? ?   Myeloperoxidase AB Unknown <0.2.Marland KitchenMarland Kitchen ? ? ? ? ? ?   Serine Protease3 AB Unknown <0.2.Marland KitchenMarland Kitchen ? ? ? ? ? ?   Complement CH50 Latest Ref Range: 41 - 95 U/mL 67 ? ? ? ? ? ?   IgG Latest Ref Range: 762 - 1,488 MG/DL 454 (L) 098 (L) ? 531 (L) 472 (L) 492 (L) 737 (L)   IgM Latest Ref Range: 38 - 328 MG/DL 119 ? ? 147 ? ? 829   IgE Latest Ref Range: <165 IU/ML 17 ? 31 ? ? ? ?   IgA Latest Ref Range: 70 - 390 MG/DL 562 ? ? 130 ? ? 865   ?  ?  03-02-2018:  CT Chest:  IMPRESSION    1. ?Mild emphysema with scattered areas of scarring. Mild lower lobe   bronchiectasis with areas of peribronchial thickening and mild patchy and   reticular opacities, greater on the left, which may be from chronic   scarring and/or pneumonitis possibly from aspiration. No superimposed   consolidation or pleural effusion.    2. Mild cardiomegaly. Coronary artery and aortic valve calcifications.    3. No thoracic lymphadenopathy.      ?Finalized by Ancil Boozer, M.D. on 03/02/2018 11:19 AM. Dictated by Ancil Boozer, M.D. on 03/02/2018 11:05 AM.  ?  ECHO 12-2017:  Interpretation Summary   ?  ?  ? Technically difficult study with poor endocardial visualization.  ? Wall motion abnormality as below is similar to prior echo.  ? Valves not seen but no doppler evidence of significant valvular disease.  ?  ? LV is moderately dilated by volume criteria.   ? Reduced left ventricular systolic function with EF = 45%.  ? Estimated pulmonary artery systolic pressure is 40?mmHg.  ? In comparison to prior study dated 05/03/17, no significant changes are seen. ?  ?   ?  Nuclear Report  ?  [facility]???????????????????????????  Divison of Nuclear Cardiac Imaging  Consultation Report  ?  EXAMINATION:?8m?Technetium-Pyrophosphate imaging for transthyretin cardiac amyloidosis.  ?  Date of Study: ?02/27/18  Study #: ?[id number]  [place] MRN: ?[id number] [place] Billing ID: ?[id number]  Referring Physician: ?Vena Austria, MD  Requested by: ?Lamont Snowball, MD  BMI: 38.5?kg/m2  ?  INDICATIONS FOR STUDY (HISTORY):?This is a 78 year old gentleman being evaluated for moderate concentric left ventricular hypertrophy and reduced global left ventricular ejection fraction.  ?  PROCEDURAL DETAILS:?Two hours after weight-adjusted intravenous injection of 21.8?mCi of 59m?Technetium-Pyrophosphate, Planar?and SPECT?images were obtained. The exam was performed supine with low energy-high resolution collimators adjusting acquisitions of 750,000 counts. Heart-contralateral lung (H/CL) ratio was calculated at the two hour acquisition. ?Comparisons of heart uptake to rib uptake were made, and then graded on a scale of 0-3.  ?  FINDINGS:   2 hours post tracer injection there is normal rib and bone uptake but no myocardial uptake of tracer.?  ?  SCINTIGRAPIC (Planar/SPECT):  Both planar and SPECT images are negative for myocardial uptake of tracer.  ?  Two hour heart/contralateral lung (H/CL) ratio: ?1.115?this metric is negative and not suggestive of ATTR cardiac amyloidosis and falls within the low equivocal range.  ?  ?  Visual Semi-Quantitative Grading Scale Analysis:?  This study is grade 0 and not suggestive of ATTR cardiac amyloidosis.  ?  SUMMARY/OPINION:   This study is negative and not suggestive of ATTR cardiac amyloidosis. ?The low equivocal heart contralateral lung ratio is likely due to variable attenuation and slight residual blood pool activity. ?The negative study does not exclude AL cardiac amyloidosis      Objective:         ? acetaminophen (TYLENOL) 325 mg tablet Take two tablets by mouth every 6 hours as needed for Pain.   ? albuterol 0.083% (PROVENTIL; VENTOLIN) 2.5 mg /3 mL (0.083 %) nebulizer solution Inhale 3 mL solution by nebulizer as directed every 6 hours as needed for Wheezing or Shortness of Breath. Indications: asthma ? albuterol sulfate (PROAIR HFA) 90 mcg/actuation aerosol inhaler Inhale two puffs by mouth into the lungs every 4 hours as needed for Wheezing or Shortness of Breath.   ? allopurinoL (ZYLOPRIM) 300 mg tablet Take one tablet by mouth daily. Take with food.   ? AMITR/GABAPEN/EMU OIL 05/24/08% CREAM (COMPOUND) Apply 5 grams (10 pumps) topically to affected area three times daily.   ? Ammonium Lactate-Emu Oil (EMU-LAC) 10 % crea Apply  topically to affected area. Apply to feet    ? ascorbic acid (VITAMIN C) 500 mg tablet Take one tablet by mouth daily.   ? aspirin EC 81 mg tablet Take one tablet by mouth daily. Take with food.   ? atorvastatin (LIPITOR) 40 mg tablet TAKE (1) TABLET BY MOUTH ONCE DAILY   ? benzonatate (TESSALON PERLES) 100 mg capsule Take two capsules by mouth every 8 hours as needed for Cough.   ? budesonide/formoterol (SYMBICORT HFA) 160/4.5 mcg inhalation Inhale two puffs by mouth into the lungs twice daily. Rinse mouth after use.   ? bumetanide (BUMEX) 2 mg tablet Take one tablet by mouth daily. Take extra dose for swelling   ? cholecalciferol (VITAMIN D-3) 1,000 units tablet Take 1,000 Units by mouth daily.   ? docusate (COLACE) 100 mg capsule Take 100 mg by mouth daily as needed for Constipation.   ? doxycycline (MONODOX) 100 mg capsule Take one capsule by mouth daily.   ? duloxetine DR (CYMBALTA) 60 mg capsule Take one capsule by mouth daily.   ? finasteride (PROSCAR) 5 mg tablet Take one tablet by mouth daily.   ? fish oil- omega 3-DHA/EPA 300/1,000 mg capsule Take 1 capsule by mouth daily.   ? gabapentin (NEURONTIN) 400 mg capsule Take 1 capsule by mouth three times daily as needed.   ? ipratropium bromide (ATROVENT) 42 mcg (0.06 %) nasal spray 1 spray to each nostril Q6H PRN   ? montelukast (SINGULAIR) 10 mg tablet Take one tablet by mouth at bedtime daily. ? nitroglycerin (NITROSTAT) 0.4 mg tablet Place 0.4 mg under tongue every 5 minutes as needed for Chest Pain. Max of 3 tablets, call 911.   ? nystatin (NYSTOP) 100,000 unit/g topical powder Apply  topically to affected area four times daily.   ? pantoprazole DR (PROTONIX) 40 mg tablet TAKE (1) TABLET BY MOUTH TWO TIMES A DAY.   ? polyethylene glycol 3350 (MIRALAX) 17 g packet Take one packet by mouth as Needed.   ? predniSONE (DELTASONE) 10 mg tablet Take three tablets by mouth daily. For four days, then 2 tablets daily until further directed by pulmonology   ? sodium chloride 3 % nebulizer solution Inhale 4 mL by mouth into the lungs twice daily. Use with [place].  Indications:  asthma, chronic cough, bronchiectasis (Patient taking differently: Inhale 4 mL by mouth into the lungs twice daily as needed. Use with [place].  Indications: asthma, chronic cough, bronchiectasis)   ? spironolactone (ALDACTONE) 25 mg tablet Take one tablet by mouth twice daily. Take with food.   ? tamsulosin (FLOMAX) 0.4 mg capsule Take 1 capsule by mouth daily.   ? trimethoprim/sulfamethoxazole (BACTRIM DS) 160/800 mg tablet Take one tablet by mouth three times weekly.   ? zinc oxide 20 % topical ointment Place twice a day on the buttock wound     Vitals:    01/03/19 0728   Weight: 129.3 kg (285 lb)   Height: 182.9 cm (72)   PainSc: Eight     Body mass index is 38.65 kg/m?[name]     Telehealth Patient Reported Ruben Wood     Row Name 01/03/19 0981                Pain Score  EIGHT buttocks              Physical Exam    Appears to feel relatively good.  Skin color is good.  Breathing comfortable.       Assessment and Plan:    Mr. 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    -- S/p hospitalization  -- Prednisone 20 gm for 1 week then decrease to 10 mg until we see each other in follow-up. -- Can come off bactrim once his prednisone drops to 10 mg.  -- Restarted Doxycycline  -- Continue aggressive pulmonary hygiene  -- Will likely need repeat CT in 4 to 6 months to assess for stability of bronchiectasis/scarring/post-COVID fibroproliferative disease  -- Continue oxygen for chronic respiratory failure with hypoxia.   ?  OSA  -- Continue current CPAP settings.  -- No complaints  ?  Allergic Asthma  - Hospitalized 02-2018 for decompensated heart failure, cellulitis and possible pneumonia.  - Hospitalized 04-20-2017 for combination of acute on chronic CHF exacerbation with probable asthma component as well.  - Hospitalized again in May 2019 for decompensated heart failure and probable asthma component as well.  - Hospitalized September 25th s/p fall with shoulder dislocation. ?Developed MRSA pneumonia while inpatient and at rehab.  - Asthma/Bronchiectasis is currently stable on therapy?but overall poorly controlled.??Difficult to ascertain asthma from CHF at times.??Symbicort, albuterol, ipratropium, montelukast.  - Pt lungs are clear of wheeze on exam. ?Some mild bibasilar crackles.  - History of eosinophilia.  ?  Results for CAROLE, GRANILLO (MRN [id number]) as of 10/03/2017 09:23  ? Ref. Range 08/28/2017 17:18   Eosinophils Latest Ref Range: 0 - 5 % 5   Absolute Eosinophil Count Latest Ref Range: 0 - 0.45 K/UL 0.40   ?  ?  Allergic Rhinitis  - Fall worst time of year  - Some increased sinus drainage. ?Mucus cough controlled with use of mucinex.  ?????????????>?Continue Singulair  ?????????????>?Remainder per Allergy  ?  Chronic sinusitis:?unchanged  - Stable  ?  Bronchiectasis  -?Proven RML bronchiectasis on previous outside CT scan  -?Continue aggressive pulmonary hygeine with flutter valve as needed. ?Doing much better with addition of VEST and 3% saline therapy.  Continue.  - Cough control is better.  ?  Concern for aspiration:?swallow study negative previously.  ?  GERD:?Continue PPI per GI (stable)  ? S/p lung herniation with bio-bridge repair.  - Follow-up outpatient with Dr.  V. ?Likely not repeat surgical candidate.  ?  AAA s/p endovascular repair:?Follow up with Dr. Chales Abrahams as scheduled  ?  CAD/CHF: currently compensated  -- ?Pt was doing well  -- ?Re-initiate cardiac rehab once he has recovered from his injuries.   ?  Morbid obesity  - Reports some weight gain with prednisone use  ?????????????>?Encouraged continued activity and attempts at weight loss  ?  BPH:?Previously referred to urology  ?  Chronic respiratory failure with hypoxia:  -- Pt requires supplemental oxygen with exertion and at rest prn.    Cellulitis:  -- Improving.  ?  ?  RTC?in?6 weeks via telehealth appointment.  Flu vaccine?UTD.  UTD on pneumonia vaccines  Call with changes in symptoms or worsening lung issues.                           30 minutes spent on this patient's encounter with counseling and coordination of care taking >50% of the visit.

## 2019-01-06 ENCOUNTER — Encounter: Admit: 2019-01-06 | Discharge: 2019-01-06 | Payer: MEDICARE

## 2019-01-09 ENCOUNTER — Ambulatory Visit: Admit: 2019-01-09 | Discharge: 2019-01-10 | Payer: MEDICARE

## 2019-01-09 ENCOUNTER — Encounter: Admit: 2019-01-09 | Discharge: 2019-01-09 | Payer: MEDICARE

## 2019-01-09 DIAGNOSIS — E782 Mixed hyperlipidemia: Secondary | ICD-10-CM

## 2019-01-09 DIAGNOSIS — M954 Acquired deformity of chest and rib: Secondary | ICD-10-CM

## 2019-01-09 DIAGNOSIS — I2510 Atherosclerotic heart disease of native coronary artery without angina pectoris: Secondary | ICD-10-CM

## 2019-01-09 DIAGNOSIS — Z8614 Personal history of Methicillin resistant Staphylococcus aureus infection: Secondary | ICD-10-CM

## 2019-01-09 DIAGNOSIS — I509 Heart failure, unspecified: Secondary | ICD-10-CM

## 2019-01-09 DIAGNOSIS — J479 Bronchiectasis, uncomplicated: Secondary | ICD-10-CM

## 2019-01-09 DIAGNOSIS — L03116 Cellulitis of left lower limb: Secondary | ICD-10-CM

## 2019-01-09 DIAGNOSIS — Z8679 Personal history of other diseases of the circulatory system: Secondary | ICD-10-CM

## 2019-01-09 DIAGNOSIS — R609 Edema, unspecified: Secondary | ICD-10-CM

## 2019-01-09 DIAGNOSIS — M961 Postlaminectomy syndrome, not elsewhere classified: Secondary | ICD-10-CM

## 2019-01-09 DIAGNOSIS — I714 Abdominal aortic aneurysm, without rupture: Secondary | ICD-10-CM

## 2019-01-09 DIAGNOSIS — J302 Other seasonal allergic rhinitis: Secondary | ICD-10-CM

## 2019-01-09 DIAGNOSIS — N183 CKD (chronic kidney disease) stage 3, GFR 30-59 ml/min: Secondary | ICD-10-CM

## 2019-01-09 DIAGNOSIS — G4733 Obstructive sleep apnea (adult) (pediatric): Secondary | ICD-10-CM

## 2019-01-09 DIAGNOSIS — Z95828 Presence of other vascular implants and grafts: Secondary | ICD-10-CM

## 2019-01-09 DIAGNOSIS — Z9981 Dependence on supplemental oxygen: Secondary | ICD-10-CM

## 2019-01-09 DIAGNOSIS — I10 Essential (primary) hypertension: Secondary | ICD-10-CM

## 2019-01-09 DIAGNOSIS — K219 Gastro-esophageal reflux disease without esophagitis: Secondary | ICD-10-CM

## 2019-01-09 DIAGNOSIS — J45909 Unspecified asthma, uncomplicated: Secondary | ICD-10-CM

## 2019-01-09 DIAGNOSIS — I429 Cardiomyopathy, unspecified: Secondary | ICD-10-CM

## 2019-01-09 DIAGNOSIS — R0600 Dyspnea, unspecified: Secondary | ICD-10-CM

## 2019-01-09 DIAGNOSIS — R05 Cough: Secondary | ICD-10-CM

## 2019-01-09 DIAGNOSIS — I5042 Chronic combined systolic (congestive) and diastolic (congestive) heart failure: Secondary | ICD-10-CM

## 2019-01-09 DIAGNOSIS — N401 Enlarged prostate with lower urinary tract symptoms: Secondary | ICD-10-CM

## 2019-01-09 DIAGNOSIS — J449 Chronic obstructive pulmonary disease, unspecified: Secondary | ICD-10-CM

## 2019-01-09 DIAGNOSIS — J189 Pneumonia, unspecified organism: Secondary | ICD-10-CM

## 2019-01-09 MED ORDER — ALBUTEROL SULFATE 2.5 MG /3 ML (0.083 %) IN NEBU
3 mL | RESPIRATORY_TRACT | 11 refills | Status: AC | PRN
Start: 2019-01-09 — End: ?

## 2019-01-09 MED ORDER — SODIUM CHLORIDE 3 % IN NEBU
4 mL | Freq: Two times a day (BID) | RESPIRATORY_TRACT | 11 refills | 30.00000 days | Status: AC
Start: 2019-01-09 — End: ?

## 2019-01-09 MED ORDER — BUDESONIDE-FORMOTEROL 160-4.5 MCG/ACTUATION IN HFAA
2 | Freq: Two times a day (BID) | RESPIRATORY_TRACT | 11 refills | 30.00000 days | Status: AC
Start: 2019-01-09 — End: ?

## 2019-01-09 MED ORDER — IPRATROPIUM BROMIDE 0.03 % NA SPRY
2 | Freq: Two times a day (BID) | NASAL | 3 refills | 30.00000 days | Status: AC
Start: 2019-01-09 — End: ?

## 2019-01-09 NOTE — Patient Instructions
Saline nebulizers twice daily  Continue the albuterol nebulizers as needed.   Continue all other medications.   Restart the ipratropium 0.03% nose spray 1-2 each nostril up to twice daily as needed for drip. Watch for nose bleeds. When using nasal sprays, make sure to aim up and out toward the eye on the same side to effectively hit the outside of the nose and avoid the septum in the middle.

## 2019-01-10 DIAGNOSIS — J4550 Severe persistent asthma, uncomplicated: Principal | ICD-10-CM

## 2019-01-12 ENCOUNTER — Encounter: Admit: 2019-01-12 | Discharge: 2019-01-12 | Payer: MEDICARE

## 2019-01-12 DIAGNOSIS — I509 Heart failure, unspecified: Secondary | ICD-10-CM

## 2019-01-12 DIAGNOSIS — R05 Cough: Secondary | ICD-10-CM

## 2019-01-12 DIAGNOSIS — L03116 Cellulitis of left lower limb: Secondary | ICD-10-CM

## 2019-01-12 DIAGNOSIS — M954 Acquired deformity of chest and rib: Secondary | ICD-10-CM

## 2019-01-12 DIAGNOSIS — G4733 Obstructive sleep apnea (adult) (pediatric): Secondary | ICD-10-CM

## 2019-01-12 DIAGNOSIS — I2510 Atherosclerotic heart disease of native coronary artery without angina pectoris: Secondary | ICD-10-CM

## 2019-01-12 DIAGNOSIS — N183 CKD (chronic kidney disease) stage 3, GFR 30-59 ml/min: Secondary | ICD-10-CM

## 2019-01-12 DIAGNOSIS — I10 Essential (primary) hypertension: Secondary | ICD-10-CM

## 2019-01-12 DIAGNOSIS — Z8614 Personal history of Methicillin resistant Staphylococcus aureus infection: Secondary | ICD-10-CM

## 2019-01-12 DIAGNOSIS — M961 Postlaminectomy syndrome, not elsewhere classified: Secondary | ICD-10-CM

## 2019-01-12 DIAGNOSIS — Z8679 Personal history of other diseases of the circulatory system: Secondary | ICD-10-CM

## 2019-01-12 DIAGNOSIS — J189 Pneumonia, unspecified organism: Secondary | ICD-10-CM

## 2019-01-12 DIAGNOSIS — I714 Abdominal aortic aneurysm, without rupture: Secondary | ICD-10-CM

## 2019-01-12 DIAGNOSIS — I5042 Chronic combined systolic (congestive) and diastolic (congestive) heart failure: Secondary | ICD-10-CM

## 2019-01-12 DIAGNOSIS — J449 Chronic obstructive pulmonary disease, unspecified: Secondary | ICD-10-CM

## 2019-01-12 DIAGNOSIS — N401 Enlarged prostate with lower urinary tract symptoms: Secondary | ICD-10-CM

## 2019-01-12 DIAGNOSIS — J302 Other seasonal allergic rhinitis: Secondary | ICD-10-CM

## 2019-01-12 DIAGNOSIS — R0600 Dyspnea, unspecified: Secondary | ICD-10-CM

## 2019-01-12 DIAGNOSIS — J45909 Unspecified asthma, uncomplicated: Secondary | ICD-10-CM

## 2019-01-12 DIAGNOSIS — I429 Cardiomyopathy, unspecified: Secondary | ICD-10-CM

## 2019-01-12 DIAGNOSIS — J479 Bronchiectasis, uncomplicated: Secondary | ICD-10-CM

## 2019-01-12 DIAGNOSIS — Z95828 Presence of other vascular implants and grafts: Secondary | ICD-10-CM

## 2019-01-12 DIAGNOSIS — K219 Gastro-esophageal reflux disease without esophagitis: Secondary | ICD-10-CM

## 2019-01-12 DIAGNOSIS — E782 Mixed hyperlipidemia: Secondary | ICD-10-CM

## 2019-01-12 DIAGNOSIS — Z9981 Dependence on supplemental oxygen: Secondary | ICD-10-CM

## 2019-01-12 DIAGNOSIS — R609 Edema, unspecified: Secondary | ICD-10-CM

## 2019-01-13 ENCOUNTER — Encounter: Admit: 2019-01-13 | Discharge: 2019-01-13 | Payer: MEDICARE

## 2019-01-13 NOTE — Telephone Encounter
I called patient's wife and she states that [facility] advised the ED and patient refused this.    Patient wanted to see Dr. Carlis Abbott and [facility] nurse called Dr. Ainsley Spinner office and cancelled the appointment and instructed to go the ED.    [name] called Dr. Carlis Abbott and explained he needed to see her and not go to the ED.  Dr. Carlis Abbott looked at his leg which had improved as the day went on prior to his visit.  She didn't feel this was a blood clot or infection.    Dr. Carlis Abbott feels that rectum may be having issues due to possible reaction to medication to nystatin cream.  She prescribed two doses of fluconazole 150 mg to take now and then take 72 hours from now.  Keep effected area covered with Vaseline.    Routing to Dr. Gibson Ramp as Farris Has, RN

## 2019-01-13 NOTE — Telephone Encounter
Thanks for the update. Please update [place] after his appt with Dr. Carlis Abbott.

## 2019-01-13 NOTE — Telephone Encounter
Patient's wife called stating he had to be seen yesterday for his leg swelling, redness, and warm to touch.  States he was lifeless and concerned.    She called back leaving a second message stating that he has an appointment today with her physician Dr. Carlis Abbott to have his leg evaluated @ 11:00 am.  She will call back after the visit to provide [place] with an update.    Routing to Dr. Gibson Ramp to make aware.    Myrtie Hawk, RN

## 2019-01-15 ENCOUNTER — Encounter: Admit: 2019-01-15 | Discharge: 2019-01-15 | Payer: MEDICARE

## 2019-01-15 ENCOUNTER — Ambulatory Visit: Admit: 2019-01-15 | Discharge: 2019-01-15 | Payer: MEDICARE

## 2019-01-15 DIAGNOSIS — I714 Abdominal aortic aneurysm, without rupture: Secondary | ICD-10-CM

## 2019-01-15 DIAGNOSIS — N401 Enlarged prostate with lower urinary tract symptoms: Secondary | ICD-10-CM

## 2019-01-15 DIAGNOSIS — Z8614 Personal history of Methicillin resistant Staphylococcus aureus infection: Secondary | ICD-10-CM

## 2019-01-15 DIAGNOSIS — R05 Cough: Secondary | ICD-10-CM

## 2019-01-15 DIAGNOSIS — N183 CKD (chronic kidney disease) stage 3, GFR 30-59 ml/min: Secondary | ICD-10-CM

## 2019-01-15 DIAGNOSIS — E782 Mixed hyperlipidemia: Secondary | ICD-10-CM

## 2019-01-15 DIAGNOSIS — M954 Acquired deformity of chest and rib: Secondary | ICD-10-CM

## 2019-01-15 DIAGNOSIS — I340 Nonrheumatic mitral (valve) insufficiency: Secondary | ICD-10-CM

## 2019-01-15 DIAGNOSIS — R609 Edema, unspecified: Secondary | ICD-10-CM

## 2019-01-15 DIAGNOSIS — I5042 Chronic combined systolic (congestive) and diastolic (congestive) heart failure: Secondary | ICD-10-CM

## 2019-01-15 DIAGNOSIS — I2510 Atherosclerotic heart disease of native coronary artery without angina pectoris: Secondary | ICD-10-CM

## 2019-01-15 DIAGNOSIS — I10 Essential (primary) hypertension: Secondary | ICD-10-CM

## 2019-01-15 DIAGNOSIS — K219 Gastro-esophageal reflux disease without esophagitis: Secondary | ICD-10-CM

## 2019-01-15 DIAGNOSIS — J189 Pneumonia, unspecified organism: Secondary | ICD-10-CM

## 2019-01-15 DIAGNOSIS — I429 Cardiomyopathy, unspecified: Secondary | ICD-10-CM

## 2019-01-15 DIAGNOSIS — Z9981 Dependence on supplemental oxygen: Secondary | ICD-10-CM

## 2019-01-15 DIAGNOSIS — R0600 Dyspnea, unspecified: Secondary | ICD-10-CM

## 2019-01-15 DIAGNOSIS — J302 Other seasonal allergic rhinitis: Secondary | ICD-10-CM

## 2019-01-15 DIAGNOSIS — G4733 Obstructive sleep apnea (adult) (pediatric): Secondary | ICD-10-CM

## 2019-01-15 DIAGNOSIS — M961 Postlaminectomy syndrome, not elsewhere classified: Secondary | ICD-10-CM

## 2019-01-15 DIAGNOSIS — J479 Bronchiectasis, uncomplicated: Secondary | ICD-10-CM

## 2019-01-15 DIAGNOSIS — J449 Chronic obstructive pulmonary disease, unspecified: Secondary | ICD-10-CM

## 2019-01-15 DIAGNOSIS — Z95828 Presence of other vascular implants and grafts: Secondary | ICD-10-CM

## 2019-01-15 DIAGNOSIS — Z8679 Personal history of other diseases of the circulatory system: Secondary | ICD-10-CM

## 2019-01-15 DIAGNOSIS — L03116 Cellulitis of left lower limb: Secondary | ICD-10-CM

## 2019-01-15 DIAGNOSIS — J45909 Unspecified asthma, uncomplicated: Secondary | ICD-10-CM

## 2019-01-15 DIAGNOSIS — I509 Heart failure, unspecified: Secondary | ICD-10-CM

## 2019-01-15 NOTE — Progress Notes
Date of Service: 01/15/2019    Ruben Wood. Ruben Wood is a 78 y.o. male.       HPI     Mr. Ruben Wood comes today for a follow-up visit.  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  He had a CT scan done today noncontrast that shows a significantly reduced aneurysm size measuring 4.2 cm.    He also has coronary artery disease a prior history of nonischemic cardiomyopathy who underwent two-vessel CTO revascularization by Dr. Alden Wood a couple of years ago with improvement in EF to 45%.  He follows with Dr. Burnett Sheng at this time.    The patient really is not having any active cardiac symptoms but has had a healthy year having gotten Covid a month or so ago and requiring not 1 but 2 hospitalizations.  He is still not recovered and feels short winded and his last CT showed improving but presents of consolidation and some fibrosis.  He likely got this from his physical therapy or rehab sessions otherwise they live completely isolated and [place].  Unfortunately his wife also got it and was admitted to [facility] but has since recovered much better than he has.    The echocardiogram in October 30th showed possibly mild to moderately reduced EF is a quality was very poor of the image but also showed eccentric mitral regurgitation of moderate to severe intensity which is a new finding and prior to echoes has not shown that.  The patient has no orthopnea or PND.  He does have CardioMEMS in place and also follows with our advanced heart failure colleagues including Dr. Sherryll Wood    He is a non-smoker and does not use alcohol    His review of systems today is positive for fatigue tiredness shortness of breath         Vitals:    01/15/19 1259   BP: 130/76   BP Source: Arm, Left Upper   Pulse: 99   Weight: 131.5 kg (290 lb)   Height: 1.803 m (5' 11)   PainSc: Zero     Body mass index is 40.45 kg/m?[name]     Past Medical History  Patient Active Problem List Diagnosis Date Noted   ? Complex care coordination 05/04/2017     Priority: High     Class: Acute   ? Acute cystitis 12/21/2018   ? Pneumonia due to COVID-19 virus 12/05/2018   ? Chronic bronchitis with acute exacerbation (HCC) 12/05/2018   ? COVID-19 virus infection 12/05/2018   ? Heart failure with reduced ejection fraction (HCC) 12/05/2018   ? Cough 12/04/2018   ? 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 Type 2 diabetes mellitus with hyperglycemia, without long-term current use of insulin (HCC) 03/12/2018   ? Atrial fibrillation (HCC) 03/12/2018   ? Orthostatic hypotension 12/28/2017   ? Morbid obesity (HCC) 11/16/2017   ? Chronic respiratory failure with hypercapnia (HCC) 11/16/2017   ? Concussion with loss of consciousness of 30 minutes or less 11/16/2017   ? Acute pain due to trauma 11/15/2017   ? Episode of syncope 11/15/2017   ? Facial laceration 11/15/2017   ? Depression 11/15/2017   ? Closed traumatic dislocation of right shoulder 11/15/2017   ? Impaired mobility 11/15/2017   ? Trauma 11/14/2017   ? OAB (overactive bladder)      Urinary frequency, urgency, urge urinary incontinence (UUI),  nocturia, nocturnal enuresis.  -- trial Mirabegron 25 mg --> improved, but persistent sx's.  -- trial Mirabegron 50 mg.     ? Urinary incontinence, urge      See OAB A&P note.     ? Urinary urgency      See OAB A&P note.     ? Nocturia      See OAB A&P note.     ? Nocturnal enuresis    ? Constipation    ? Swelling of left hand 10/07/2017     Resolved, unclear etiology.     ? Chronic combined systolic and diastolic CHF (congestive heart failure) (HCC) 07/13/2017   ? Upper extremity pain, anterior, right 07/03/2017   ? Right hand pain 07/03/2017   ? Coronary artery disease due to lipid rich plaque 06/29/2017   ? Oropharyngeal dysphagia 05/10/2017   ? Bronchiectasis with acute lower respiratory infection (HCC) 05/10/2017 ? Acute on chronic respiratory failure with hypoxia (HCC) 05/02/2017   ? Acute on chronic systolic congestive heart failure (HCC) 05/02/2017   ? Acute on chronic systolic and diastolic heart failure, NYHA class 3 (HCC) 03/27/2017   ? Urinary frequency 03/08/2017   ? 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     ? 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  D/c'd Tamsulosin d/t lack of symptom improvement.     ? Enrolled in chronic care management 02/28/2017   ? Elevated prostate specific antigen (PSA)      PNBx (03/01/2017): (L) high-grade prostatic intraepithelial neoplasia (HG PIN), 1/6 cores; [name], PA-C.     ? Spondylolisthesis, lumbar region 12/13/2016   ? Acute respiratory failure with hypoxia (HCC) 11/07/2016   ? Lactic acid acidosis 11/07/2016   ? Hyperglycemia 11/07/2016   ? Lumbar post-laminectomy syndrome      L4-5     ? Iron deficiency 10/09/2016   ? Spinal stenosis of lumbosacral region 09/20/2016   ? Spondylolisthesis of lumbosacral region 09/20/2016   ? Lumbar radiculopathy 09/20/2016   ? Osteoarthritis of spine with radiculopathy, lumbosacral region 09/20/2016   ? Venous ulcer of left leg (HCC) 09/13/2016   ? 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 Lower extremity edema 07/13/2016   ? 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 He had a normal response to pneumococca vaccination; he had 17/23 positive serotypes. He has normal tetanus toxoid Ab and CH50.  His T&B cell panel revealed a mildly low B-cell count at 68 right after a time of acute illness.  He had positive diphtheria antibody. Likely multifactorial with significant comorbidities including frequent steroid use for bronchiectasis exacerbations, frequent infections, and possibly a nutritional component (low total protein).    Currently on doxycycline once daily for recurrent infections, which he feels has been tremendously helpful.     Plan:  - Continue doxycycline 100 mg daily  - Recommend getting the pneumovax immunization next time he is in clinic in person and will need repeat pneumococcal antibody levels 1 month afterwards once he has had resolution of his COVID issues.          ? Moderate persistent asthma with acute exacerbation 02/25/2016     He has had asthma  since sometime in his 20-30s. He gets cough, chest tightness, wheezing, shortness of breath that is year round with worsening in the Spring and Fall.   Triggers: URI, hot/cold air. Additionally he has been diagnosed with bronchiectasis and emphysema; his PFTs suggest both obstructive and restrictive disease and asthma is unlikely to be the sole cause of his dyspnea.    He has negative IgE immunocaps to aeroallergens which makes the possibility of allergic asthma highly unlikely. His IgE level was 17 and he had negative ANCAs, MPO and PR3.     Repeatedly being treated with steroids now for flares.  Also on hypertonic saline 3 times a day although not currently using them), DuoNebs, Symbicort, and Singulair as well as aggressive pulmonary clearance with vest and Aerobika.        ? 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 He is not having issues with urticaria.    - We recommend IgE immunocaps to aeroallergens.      ? Chronic Rhinoconjunctivitis 02/25/2016     Perennial, since childhood.  Nasal sprays are not helpful (Flonase, Nasacort) and patient had incorrect technique with sprays with resultant nosebleeds.  AIT several times in his life, only the first round was helpful.  02/2016 - CT sinuses normal.  02/2016 - Aeroallergen IgE negative. Currently on Zyrtec 10mg  daily and Singulair 10mg  daily off flunisolide and Atrovent.   Also has significant drip when he eats, concerning for gustatory rhinitis.      ? Recurrent infections 02/25/2016     Recurrent sinopulmonary infections requiring antibiotics 5-10 times per year.   Cellulitis and non-healing wounds.   Many underlying illnesses predisposing him to these infections in addition to intermittent hypogammaglobulinemia associated with active infections and systemic steroids.      ? Postoperative visit 02/23/2016   ? S/P left pulmonary artery pressure sensor implant placement (CardioMEMs)  02/02/2016     * Patient has CardioMEMs (Pulmonary Artery Pressure Sensor)* Please call Matthew Folks, CardioMEMs Program Coordinator [phone] or [facility] Failure Rounding Team if patient is admitted or presents to ED*  03/30/15 implant     ? Ischemic cardiomyopathy 01/12/2016   ? Other male erectile dysfunction 01/12/2016   ? Wound of skin 01/12/2016   ? 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 Varicose veins of left lower extremity with pain 01/05/2016   ? ICD (implantable cardioverter-defibrillator), biventricular, in situ 12/07/2015   ? Hypercholesterolemia 11/25/2015   ? 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 Bronchiectasis without complication (HCC) 06/18/2015     Non-sputum producer.  Currently on Symbicort.  - Dr. [name] was able to get him a vest to wear at home for secretions and this has helped immensely with his secretions and breathing.      ? Abnormal cortisol level 12/25/2014   ? Venous stasis dermatitis of both lower extremities 11/04/2014 ? Sacroiliac dysfunction 07/28/2014   ? Osteoarthritis of spine with radiculopathy, lumbar region 07/28/2014   ? Chronic combined systolic and diastolic congestive heart failure, NYHA class 2 (HCC) 07/19/2014     11/04/14: EF 20%, severely dilated LV  with grade 1 diastolic dysfunction.  11/12/2014 In clinic today, patient appears fairly well compensated.  He does have some LEE, but no significant rales (just some at bases), no JVD sitting upright, and no sx to suggest decompensation.  We will continue metoprolol, spironolactone, and bumex at current doses.    11/17/2014 Edema much improved in lower ext, Cr trending down on last labs.  Will recheck labs today.  Advised to weigh himself daily.  Rechecking BMP today to ensure not overdoing diuretics.     On metoprolol, spironolactone.  Will need to explore allergy to ARB more as patient may benefit from ACEI.      03/30/15 CardioMEMS implant by Dr. Chales Abrahams  1. Normal cardiac output and cardiac index.  2. Normal pulmonary pressures and normal pulmonary capillary wedge pressure.  3. Successful insertion of CardioMEMS pulmonary artery pressure sensor     ? 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      ? CKD (chronic kidney disease) 08/26/2013   ? Mixed dyslipidemia 08/26/2013     Hepatic Function    Lab Results   Component Value Date/Time    ALBUMIN 4.0 11/04/2014 12:26 AM    TOTAL PROTEIN 6.9 11/04/2014 12:26 AM    ALK PHOSPHATASE 61 11/04/2014 12:26 AM    Lab Results   Component Value Date/Time    AST (SGOT) 41* 11/04/2014 12:26 AM    ALT (SGPT) 19 11/04/2014 12:26 AM TOTAL BILIRUBIN 1.4* 11/04/2014 12:26 AM        Lab Results   Component Value Date    CHOL 148 12/15/2013    TRIG 122 12/15/2013    HDL 51 12/15/2013    LDL 88 12/15/2013    VLDL 24 12/15/2013    NONHDLCHOL 97 12/15/2013       BP Readings from Last 3 Encounters:   11/12/14 129/80   11/09/14 111/44   11/04/14 146/65     Plan:   78 y.o. with known CAD with CTO of RCA and obtuse marginal branch with high grade stenosis (90%) in mid left circ.    Advised heart healthy diet and daily exercise.    Continue high intensity statin, atorvastatin       ? AAA (abdominal aortic aneurysm) (HCC) 10/15/2012     Duplex done at OSH in 2007 and 2008 ~ 4.1 cm    Duplex 10/15/12-AAA 7.3 cm AP x 7.3 cm       ? History of MRSA infection 10/14/2012   ? Chronic cough 08/29/2012   ? CAD (coronary artery disease), native coronary artery 08/15/2012     07/11/2002- Cath @ [facility] showed Severe double vessel disease(OMB of circumflex and distal circ)  inferior-basilar dyskinesis.                  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    09/12/12   Cath - [place]:  CTO of the right coronary artery, CTO of the  obtuse marginal branch and high-grade stenosis of   90% in the mid left circumflex artery No significant stenosis in the left anterior descending artery or left main vessel     Failed attempt in opening 2nd obtuse marginal CTO as described above.    10/14/12: Unsuccessful attempt to open CTO of OMB and mid-CFX by Dr. Mackey Birchwood      04/02/17: Cath by Dr. Steward Ros:   4. Chronic total occlusion of the circumflex.  5. Chronic total occlusion of the right coronary artery, status post percutaneous coronary intervention with drug-eluting stent times 2.  6. Small distal right coronary artery perforation without echo or hemodynamic evidence of compromise. 06/29/17-cardiac catheterization by Dr. Steward Ros. One DES placed to chronic total occlusion of the circumflex artery. The RCA stent was patent.     ? OSA on CPAP 08/13/2012     CPAP at  DME: [facility]    Split night:  08/14/2017  AHI 43.2  REM n/a  Time below 88 %: 98 mins  Lowest sat: 83%     CPAP 10 cm h20 effective      ? COPD (chronic obstructive pulmonary disease) (HCC) 08/13/2012     04/28/13: PFTs with more restrictive pattern with FEV1 50%, FEV1/FVC 89%, DLCO 65%.    12/27/13: PFTs with FEV1 82%, FEV1/FVC 113%, and DLCO 77%     ? GERD (gastroesophageal reflux disease) 08/13/2012     -PPI  -follows with GI     ? 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  Discussed the importance of diet.  Exercise as tolerated.        ? Essential hypertension 08/13/2012   ? Chest wall deformity 07/09/2012     Chest wall reconstruction on 07/09/12  Lung herniation- bio bridge           Review of Systems   Constitution: Negative.   HENT: Negative.    Eyes: Negative.    Cardiovascular: Negative.    Respiratory: Negative.    Endocrine: Negative.    Hematologic/Lymphatic: Negative.    Skin: Negative.    Musculoskeletal: Negative.    Gastrointestinal: Negative.    Genitourinary: Negative.    Neurological: Negative.    Psychiatric/Behavioral: Negative.    Allergic/Immunologic: Negative.        Physical Exam    ?  General Appearance: no acute distress, obese  Skin: warm &?intact ?  HEENT: EOMI, mucous membranes moist, oropharynx is clear ?  Neck Veins: neck veins are flat &?not distended ?  Carotid Arteries: no bruits ?  Chest Inspection: chest incisions are well healed ?  Auscultation/Percussion: lungs clear to auscultation, no rales, rhonchi, or wheezing ? Cardiac Rhythm: regular rhythm &?normal rate ?  Cardiac Auscultation: distant heart tones, but otherwise normal S1 &?S2, no S3 or S4, no rub ?  Murmurs: no cardiac murmurs ?  Extremities: 1-2+ bilateral pedal edema  Abdominal Exam: obese, non-tender, no masses, bowel sounds+, nonpalpable abdominal aorta ?  Liver &?Spleen: difficult to assess due to obese body habitus ?  Neurologic Exam: oriented to time, place and person; no focal neurologic deficit      Problems Addressed Today  Encounter Diagnoses   Name Primary?   ? Abdominal aortic aneurysm (  AAA) without rupture (HCC) Yes   ? Coronary artery disease involving native coronary artery without angina pectoris, unspecified whether native or transplanted heart    ? Nonrheumatic mitral valve regurgitation        Assessment and Plan     Abdominal aortic aneurysm status post endovascular repair: The patient is quite asymptomatic and doing really well with a CT done today shows a sac of 4.2 cm.  I think we can safely wait another 2 years before repeating a noncontrast CT    New onset of moderate to severe mitral regurgitation: This was per an echo done last month while he was in the hospital recovering from [facility] and is a new finding.  I hope this is transient but is of unknown explain etiology.  We are scheduling a follow-up visit with Dr. Jacqulyn Ducking in 3 months with a repeat echocardiogram.  He will continue to follow-up with our heart failure colleagues also.  He also has a CardioMEMS in place.  I am not making any changes to his medication regimen at this time           Current Medications (including today's revisions)  ? acetaminophen (TYLENOL) 325 mg tablet Take two tablets by mouth every 6 hours as needed for Pain.   ? albuterol 0.083% (PROVENTIL) 2.5 mg /3 mL (0.083 %) nebulizer solution Inhale 3 mL solution by nebulizer as directed every 6 hours as needed for Wheezing or Shortness of Breath. Indications: asthma ? albuterol sulfate (PROAIR HFA) 90 mcg/actuation aerosol inhaler Inhale two puffs by mouth into the lungs every 4 hours as needed for Wheezing or Shortness of Breath.   ? allopurinoL (ZYLOPRIM) 300 mg tablet Take one tablet by mouth daily. Take with food.   ? AMITR/GABAPEN/EMU OIL 05/24/08% CREAM (COMPOUND) Apply 5 grams (10 pumps) topically to affected area three times daily.   ? Ammonium Lactate-Emu Oil (EMU-LAC) 10 % crea Apply  topically to affected area. Apply to feet    ? ascorbic acid (VITAMIN C) 500 mg tablet Take one tablet by mouth daily.   ? aspirin EC 81 mg tablet Take one tablet by mouth daily. Take with food.   ? atorvastatin (LIPITOR) 40 mg tablet TAKE (1) TABLET BY MOUTH ONCE DAILY   ? benzonatate (TESSALON PERLES) 100 mg capsule Take two capsules by mouth every 8 hours as needed for Cough.   ? budesonide-formoterol (SYMBICORT HFA) 160-4.5 mcg/actuation inhalation Inhale two puffs by mouth into the lungs twice daily. Rinse mouth after use.   ? bumetanide (BUMEX) 2 mg tablet Take one tablet by mouth daily. Take extra dose for swelling   ? cholecalciferol (VITAMIN D-3) 1,000 units tablet Take 1,000 Units by mouth daily.   ? docusate (COLACE) 100 mg capsule Take 100 mg by mouth daily as needed for Constipation.   ? doxycycline (MONODOX) 100 mg capsule Take one capsule by mouth daily.   ? duloxetine DR (CYMBALTA) 60 mg capsule Take one capsule by mouth daily.   ? finasteride (PROSCAR) 5 mg tablet Take one tablet by mouth daily.   ? fish oil- omega 3-DHA/EPA 300/1,000 mg capsule Take 1 capsule by mouth daily.   ? gabapentin (NEURONTIN) 400 mg capsule Take 1 capsule by mouth three times daily as needed.   ? ipratropium (ATROVENT) 0.03 % nasal spray Apply two sprays to each nostril as directed every 12 hours.   ? montelukast (SINGULAIR) 10 mg tablet Take one tablet by mouth at bedtime daily. ? nitroglycerin (NITROSTAT) 0.4 mg  tablet Place 0.4 mg under tongue every 5 minutes as needed for Chest Pain. Max of 3 tablets, call 911.   ? nystatin (NYSTOP) 100,000 unit/g topical powder Apply  topically to affected area four times daily.   ? pantoprazole DR (PROTONIX) 40 mg tablet TAKE (1) TABLET BY MOUTH TWO TIMES A DAY.   ? polyethylene glycol 3350 (MIRALAX) 17 g packet Take one packet by mouth as Needed.   ? predniSONE (DELTASONE) 10 mg tablet Take three tablets by mouth daily. For four days, then 2 tablets daily until further directed by pulmonology   ? sodium chloride 3 % nebulizer solution Inhale 4 mL by mouth into the lungs twice daily. Use with [place].  Indications: asthma, chronic cough, bronchiectasis   ? spironolactone (ALDACTONE) 25 mg tablet Take one tablet by mouth twice daily. Take with food.   ? tamsulosin (FLOMAX) 0.4 mg capsule Take one capsule by mouth daily.   ? trimethoprim/sulfamethoxazole (BACTRIM DS) 160/800 mg tablet Take one tablet by mouth three times weekly.   ? zinc oxide 20 % topical ointment Place twice a day on the buttock wound

## 2019-01-15 NOTE — Telephone Encounter
Orders entered.  Will route to Red team for follow up.

## 2019-01-15 NOTE — Telephone Encounter
-----   Message from Erle Crocker, MD sent at 01/15/2019  1:32 PM CST -----  Thurmond Butts    His echo in october showed new moderate to severe MR    I am ordering a repeat and a follow up with you.    Everlean Patterson

## 2019-01-21 ENCOUNTER — Encounter: Admit: 2019-01-21 | Discharge: 2019-01-21 | Payer: MEDICARE

## 2019-01-21 NOTE — Telephone Encounter
Renewal RX for Afflovest received via fax from [employer].   E-mailed to Princella Ion MA.  Routing to Princella Ion MA to have Dr.Sharpe sign and then fax back.

## 2019-01-23 ENCOUNTER — Encounter: Admit: 2019-01-23 | Discharge: 2019-01-23 | Payer: MEDICARE

## 2019-01-24 ENCOUNTER — Ambulatory Visit: Admit: 2019-01-24 | Discharge: 2019-01-25 | Payer: MEDICARE

## 2019-01-24 ENCOUNTER — Encounter: Admit: 2019-01-24 | Discharge: 2019-01-24 | Payer: MEDICARE

## 2019-01-24 DIAGNOSIS — J479 Bronchiectasis, uncomplicated: Secondary | ICD-10-CM

## 2019-01-24 DIAGNOSIS — M954 Acquired deformity of chest and rib: Secondary | ICD-10-CM

## 2019-01-24 DIAGNOSIS — Z95828 Presence of other vascular implants and grafts: Secondary | ICD-10-CM

## 2019-01-24 DIAGNOSIS — R05 Cough: Secondary | ICD-10-CM

## 2019-01-24 DIAGNOSIS — I5042 Chronic combined systolic (congestive) and diastolic (congestive) heart failure: Secondary | ICD-10-CM

## 2019-01-24 DIAGNOSIS — J45909 Unspecified asthma, uncomplicated: Secondary | ICD-10-CM

## 2019-01-24 DIAGNOSIS — J302 Other seasonal allergic rhinitis: Secondary | ICD-10-CM

## 2019-01-24 DIAGNOSIS — Z8679 Personal history of other diseases of the circulatory system: Secondary | ICD-10-CM

## 2019-01-24 DIAGNOSIS — I429 Cardiomyopathy, unspecified: Secondary | ICD-10-CM

## 2019-01-24 DIAGNOSIS — I714 Abdominal aortic aneurysm, without rupture: Secondary | ICD-10-CM

## 2019-01-24 DIAGNOSIS — J189 Pneumonia, unspecified organism: Secondary | ICD-10-CM

## 2019-01-24 DIAGNOSIS — L03116 Cellulitis of left lower limb: Secondary | ICD-10-CM

## 2019-01-24 DIAGNOSIS — E782 Mixed hyperlipidemia: Secondary | ICD-10-CM

## 2019-01-24 DIAGNOSIS — M961 Postlaminectomy syndrome, not elsewhere classified: Secondary | ICD-10-CM

## 2019-01-24 DIAGNOSIS — K219 Gastro-esophageal reflux disease without esophagitis: Secondary | ICD-10-CM

## 2019-01-24 DIAGNOSIS — Z9981 Dependence on supplemental oxygen: Secondary | ICD-10-CM

## 2019-01-24 DIAGNOSIS — I5030 Unspecified diastolic (congestive) heart failure: Secondary | ICD-10-CM

## 2019-01-24 DIAGNOSIS — I2510 Atherosclerotic heart disease of native coronary artery without angina pectoris: Secondary | ICD-10-CM

## 2019-01-24 DIAGNOSIS — Z8614 Personal history of Methicillin resistant Staphylococcus aureus infection: Secondary | ICD-10-CM

## 2019-01-24 DIAGNOSIS — I509 Heart failure, unspecified: Secondary | ICD-10-CM

## 2019-01-24 DIAGNOSIS — R609 Edema, unspecified: Secondary | ICD-10-CM

## 2019-01-24 DIAGNOSIS — N401 Enlarged prostate with lower urinary tract symptoms: Secondary | ICD-10-CM

## 2019-01-24 DIAGNOSIS — J449 Chronic obstructive pulmonary disease, unspecified: Secondary | ICD-10-CM

## 2019-01-24 DIAGNOSIS — R0600 Dyspnea, unspecified: Secondary | ICD-10-CM

## 2019-01-24 DIAGNOSIS — N183 CKD (chronic kidney disease) stage 3, GFR 30-59 ml/min: Secondary | ICD-10-CM

## 2019-01-24 DIAGNOSIS — G4733 Obstructive sleep apnea (adult) (pediatric): Secondary | ICD-10-CM

## 2019-01-24 DIAGNOSIS — I10 Essential (primary) hypertension: Secondary | ICD-10-CM

## 2019-01-24 NOTE — Progress Notes
Telehealth Visit Note    Date of Service: 01/24/2019    Obtained patient's verbal consent to treat them and their agreement to [facility] financial policy and NPP via this telehealth visit during the [facility] Emergency.    This video home visit was conducted via communication between the patient and physician/provider due to COVID-19.  We have found that certain health care needs can be provided without an in-person visit.. This service lets [place] provide the care patients' need.  If a prescription is necessary we can send it directly to their pharmacy.  If testing is necessary this can be arranged.    Ruben Wood is a 78 y.o. male.     HPI     I had the pleasure of speaking to Ruben Wood, who is accompanied by his wife today via [facility] telehealth for heart failure follow-up.  He was last seen in cardiology office by Dr. Harley Alto on November 25.  He has medical history significant for abdominal aortic aneurysm with endovascular repair, he had a CT scan on November 25 (noncontrast) that showed significantly reduced aneurysm size of 4.2 cm.  In addition he has had coronary artery disease and had two-vessel CTO by Dr. Alden Server.  He usually follows with Dr. Sherryll Burger.  Of note he had COVID-19 infection in October, he had not felt like he was fully recovered.  He has implanted CardioMEMS PA sensor. Today he tells me that he feels okay.  His home weights have been about the same.  He does have difficulty adhering to fluid restriction, and it sounds like he has 3 or 432 ounce bottles of water a day.  He tells me he is able to walk about 20 feet before becoming short of breath or having worsening back pain.  He has had home physical therapy.  He denies any chest pain or palpitations.  He does have some peripheral edema and abdominal bloating which is chronic for him.  He does use his CPAP at night, he sleeps in a recliner as he has for about a year after he had a dislocated right shoulder.  He has an occasional cough, he denies any fever or chills.  Of note he had been hospitalized recently and had to go to take night rehab hospital last hospitalization was from October 30 through November 5 for dyspnea.  He tells me that after he got out of rehab he had a sore on his bottom, home health has been coming to see him 3 times a week and they tell me he is going to be seen twice weekly starting next week.  He has been taking his medications as prescribed.         Telehealth Patient Reported Vitals     Row Name 01/24/19 1018                Weight:  129.3 kg (285 lb)        Height:  1.803 m (5' 11)        Pain Score:  Zero        Pain Score  Zero                 There is no height or weight on file to calculate BMI.     Past Medical History  Patient Active Problem List    Diagnosis Date Noted   ? Complex care coordination 05/04/2017     Priority: High     Class: Acute   ?  Acute cystitis 12/21/2018   ? Pneumonia due to COVID-19 virus 12/05/2018   ? Chronic bronchitis with acute exacerbation (HCC) 12/05/2018   ? COVID-19 virus infection 12/05/2018   ? Heart failure with reduced ejection fraction (HCC) 12/05/2018   ? Cough 12/04/2018   ? 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 Type 2 diabetes mellitus with hyperglycemia, without long-term current use of insulin (HCC) 03/12/2018 ? Atrial fibrillation (HCC) 03/12/2018   ? Orthostatic hypotension 12/28/2017   ? Morbid obesity (HCC) 11/16/2017   ? Chronic respiratory failure with hypercapnia (HCC) 11/16/2017   ? Concussion with loss of consciousness of 30 minutes or less 11/16/2017   ? Acute pain due to trauma 11/15/2017   ? Episode of syncope 11/15/2017   ? Facial laceration 11/15/2017   ? Depression 11/15/2017   ? Closed traumatic dislocation of right shoulder 11/15/2017   ? Impaired mobility 11/15/2017   ? Trauma 11/14/2017   ? OAB (overactive bladder)      Urinary frequency, urgency, urge urinary incontinence (UUI), nocturia, nocturnal enuresis.  -- trial Mirabegron 25 mg --> improved, but persistent sx's.  -- trial Mirabegron 50 mg.     ? Urinary incontinence, urge      See OAB A&P note.     ? Urinary urgency      See OAB A&P note.     ? Nocturia      See OAB A&P note.     ? Nocturnal enuresis    ? Constipation    ? Swelling of left hand 10/07/2017     Resolved, unclear etiology.     ? Chronic combined systolic and diastolic CHF (congestive heart failure) (HCC) 07/13/2017   ? Upper extremity pain, anterior, right 07/03/2017   ? Right hand pain 07/03/2017   ? Coronary artery disease due to lipid rich plaque 06/29/2017   ? Oropharyngeal dysphagia 05/10/2017   ? Bronchiectasis with acute lower respiratory infection (HCC) 05/10/2017   ? Acute on chronic respiratory failure with hypoxia (HCC) 05/02/2017   ? Acute on chronic systolic congestive heart failure (HCC) 05/02/2017   ? Acute on chronic systolic and diastolic heart failure, NYHA class 3 (HCC) 03/27/2017   ? Urinary frequency 03/08/2017   ? 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     ? 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 D/c'd Tamsulosin d/t lack of symptom improvement.     ? Enrolled in chronic care management 02/28/2017   ? Elevated prostate specific antigen (PSA)      PNBx (03/01/2017): (L) high-grade prostatic intraepithelial neoplasia (HG PIN), 1/6 cores; [name], PA-C.     ? Spondylolisthesis, lumbar region 12/13/2016   ? Acute respiratory failure with hypoxia (HCC) 11/07/2016   ? Lactic acid acidosis 11/07/2016   ? Hyperglycemia 11/07/2016   ? Lumbar post-laminectomy syndrome      L4-5     ? Iron deficiency 10/09/2016   ? Spinal stenosis of lumbosacral region 09/20/2016   ? Spondylolisthesis of lumbosacral region 09/20/2016   ? Lumbar radiculopathy 09/20/2016   ? Osteoarthritis of spine with radiculopathy, lumbosacral region 09/20/2016   ? Venous ulcer of left leg (HCC) 09/13/2016   ? 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 Lower extremity  edema 07/13/2016   ? 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 He had a normal response to pneumococca vaccination; he had 17/23 positive serotypes. He has normal tetanus toxoid Ab and CH50.  His T&B cell panel revealed a mildly low B-cell count at 68 right after a time of acute illness.  He had positive diphtheria antibody.    Likely multifactorial with significant comorbidities including frequent steroid use for bronchiectasis exacerbations, frequent infections, and possibly a nutritional component (low total protein).    Currently on doxycycline once daily for recurrent infections, which he feels has been tremendously helpful.     Plan:  - Continue doxycycline 100 mg daily  - Recommend getting the pneumovax immunization next time he is in clinic in person and will need repeat pneumococcal antibody levels 1 month afterwards once he has had resolution of his COVID issues.          ? Moderate persistent asthma with acute exacerbation 02/25/2016 He has had asthma since sometime in his 20-30s. He gets cough, chest tightness, wheezing, shortness of breath that is year round with worsening in the Spring and Fall.   Triggers: URI, hot/cold air. Additionally he has been diagnosed with bronchiectasis and emphysema; his PFTs suggest both obstructive and restrictive disease and asthma is unlikely to be the sole cause of his dyspnea.    He has negative IgE immunocaps to aeroallergens which makes the possibility of allergic asthma highly unlikely. His IgE level was 17 and he had negative ANCAs, MPO and PR3.     Repeatedly being treated with steroids now for flares.  Also on hypertonic saline 3 times a day although not currently using them), DuoNebs, Symbicort, and Singulair as well as aggressive pulmonary clearance with vest and Aerobika.        ? 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 He is not having issues with urticaria.    - We recommend IgE immunocaps to aeroallergens.      ? Chronic Rhinoconjunctivitis 02/25/2016     Perennial, since childhood.  Nasal sprays are not helpful (Flonase, Nasacort) and patient had incorrect technique with sprays with resultant nosebleeds.  AIT several times in his life, only the first round was helpful.  02/2016 - CT sinuses normal.  02/2016 - Aeroallergen IgE negative.    Currently on Zyrtec 10mg  daily and Singulair 10mg  daily off flunisolide and Atrovent.   Also has significant drip when he eats, concerning for gustatory rhinitis.      ? Recurrent infections 02/25/2016     Recurrent sinopulmonary infections requiring antibiotics 5-10 times per year.   Cellulitis and non-healing wounds.   Many underlying illnesses predisposing him to these infections in addition to intermittent hypogammaglobulinemia associated with active infections and systemic steroids.      ? Postoperative visit 02/23/2016   ? S/P left pulmonary artery pressure sensor implant placement (CardioMEMs)  02/02/2016 * Patient has CardioMEMs (Pulmonary Artery Pressure Sensor)* Please call Matthew Folks, CardioMEMs Program Coordinator [phone] or page Heart Failure Rounding Team if patient is admitted or presents to ED*  03/30/15 implant     ? Ischemic cardiomyopathy 01/12/2016   ? Other male erectile dysfunction 01/12/2016   ? Wound of skin 01/12/2016   ? 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 Varicose veins of left lower extremity with pain 01/05/2016   ?  ICD (implantable cardioverter-defibrillator), biventricular, in situ 12/07/2015   ? Hypercholesterolemia 11/25/2015   ? 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 Bronchiectasis without complication (HCC) 06/18/2015     Non-sputum producer.  Currently on Symbicort.  - Dr. [name] was able to get him a vest to wear at home for secretions and this has helped immensely with his secretions and breathing.      ? Abnormal cortisol level 12/25/2014   ? Venous stasis dermatitis of both lower extremities 11/04/2014   ? Sacroiliac dysfunction 07/28/2014   ? Osteoarthritis of spine with radiculopathy, lumbar region 07/28/2014   ? Chronic combined systolic and diastolic congestive heart failure, NYHA class 2 (HCC) 07/19/2014     11/04/14: EF 20%, severely dilated LV with grade 1 diastolic dysfunction.  11/12/2014 In clinic today, patient appears fairly well compensated.  He does have some LEE, but no significant rales (just some at bases), no JVD sitting upright, and no sx to suggest decompensation.  We will continue metoprolol, spironolactone, and bumex at current doses. 11/17/2014 Edema much improved in lower ext, Cr trending down on last labs.  Will recheck labs today.  Advised to weigh himself daily.  Rechecking BMP today to ensure not overdoing diuretics.     On metoprolol, spironolactone.  Will need to explore allergy to ARB more as patient may benefit from ACEI.      03/30/15 CardioMEMS implant by Dr. Chales Abrahams  1. Normal cardiac output and cardiac index.  2. Normal pulmonary pressures and normal pulmonary capillary wedge pressure.  3. Successful insertion of CardioMEMS pulmonary artery pressure sensor     ? 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      ? CKD (chronic kidney disease) 08/26/2013   ? Mixed dyslipidemia 08/26/2013     Hepatic Function    Lab Results   Component Value Date/Time    ALBUMIN 4.0 11/04/2014 12:26 AM    TOTAL PROTEIN 6.9 11/04/2014 12:26 AM    ALK PHOSPHATASE 61 11/04/2014 12:26 AM    Lab Results   Component Value Date/Time    AST (SGOT) 41* 11/04/2014 12:26 AM    ALT (SGPT) 19 11/04/2014 12:26 AM    TOTAL BILIRUBIN 1.4* 11/04/2014 12:26 AM        Lab Results   Component Value Date    CHOL 148 12/15/2013    TRIG 122 12/15/2013    HDL 51 12/15/2013    LDL 88 12/15/2013    VLDL 24 12/15/2013    NONHDLCHOL 97 12/15/2013       BP Readings from Last 3 Encounters:   11/12/14 129/80   11/09/14 111/44   11/04/14 146/65     Plan:   78 y.o. with known CAD with CTO of RCA and obtuse marginal branch with high grade stenosis (90%)  in mid left circ.    Advised heart healthy diet and daily exercise.    Continue high intensity statin, atorvastatin ? AAA (abdominal aortic aneurysm) (HCC) 10/15/2012     Duplex done at OSH in 2007 and 2008 ~ 4.1 cm    Duplex 10/15/12-AAA 7.3 cm AP x 7.3 cm       ? History of MRSA infection 10/14/2012   ? Chronic cough 08/29/2012   ? CAD (coronary artery disease), native coronary artery 08/15/2012     07/11/2002- Cath @ [facility] showed Severe double vessel disease(OMB of circumflex and distal circ)  inferior-basilar dyskinesis.                  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    09/12/12   Cath - :  CTO of the right coronary artery, CTO of the obtuse marginal branch and high-grade stenosis of   90% in the mid left circumflex artery No significant stenosis in the left anterior descending artery or left main vessel     Failed attempt in opening 2nd obtuse marginal CTO as described above.    10/14/12: Unsuccessful attempt to open CTO of OMB and mid-CFX by Dr. Mackey Birchwood      04/02/17: Cath by Dr. Steward Ros:   4. Chronic total occlusion of the circumflex.  5. Chronic total occlusion of the right coronary artery, status post percutaneous coronary intervention with drug-eluting stent times 2.  6. Small distal right coronary artery perforation without echo or hemodynamic evidence of compromise.    06/29/17-cardiac catheterization by Dr. Steward Ros. One DES placed to chronic total occlusion of the circumflex artery. The RCA stent was patent.     ? OSA on CPAP 08/13/2012     CPAP at  DME: [facility]    Split night:  08/14/2017  AHI 43.2  REM n/a  Time below 88 %: 98 mins  Lowest sat: 83%     CPAP 10 cm h20 effective      ? COPD (chronic obstructive pulmonary disease) (HCC) 08/13/2012     04/28/13: PFTs with more restrictive pattern with FEV1 50%, FEV1/FVC 89%, DLCO 65%.    12/27/13: PFTs with FEV1 82%, FEV1/FVC 113%, and DLCO 77% ? GERD (gastroesophageal reflux disease) 08/13/2012     -PPI  -follows with GI     ? 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  Discussed the importance of diet.  Exercise as tolerated.        ? Essential hypertension 08/13/2012   ? Chest wall deformity 07/09/2012     Chest wall reconstruction on 07/09/12  Lung herniation- bio bridge           Review of Systems   Constitution: Negative.   HENT: Negative.    Eyes: Negative.    Cardiovascular: Negative.    Respiratory: Negative.    Endocrine: Negative.    Hematologic/Lymphatic: Negative.    Skin: Negative.    Musculoskeletal: Negative.    Gastrointestinal: Negative.    Genitourinary: Negative.    Neurological: Negative.  Psychiatric/Behavioral: Negative.    Allergic/Immunologic: Negative.        Physical exam limited d/t video tele health visit:  General Appearance: patient in no apparent distress, obese.  Skin: pink, no jaundice, exposed skin is intact  Eyes: conjunctivae and lids normal, pupils are equal and round   Lips: no pallor or cyanosis   Neck Veins: VESSELS:JVP difficult to appreciate    Respiratory Effort: breathing comfortably, no respiratory distress   Lower extremities: not visualized  Orientation: oriented to time, place and person   PSYCH: Appropriate   Language and Memory: patient responsive and seems to comprehend information   NEURO: Alert and conversant  Hands are discolored (appears to be chronic)      Cardiovascular Studies  Daily CardioMEMS Readings  ?  Goal PA Diastolic: 11-13 mmHg   PA Diastolic Thresholds: 9-15 mmHg  ?  Taken on PA Systolic PA Diastolic PA Mean Heart Rate   16-11-9602, 08:02 AM 26 mmHg 10 mmHg 16 mmHg 115 bpm   01-23-2019, 10:51 AM 33 mmHg 10 mmHg 17 mmHg 108 bpm 01-22-2019, 07:02 AM 25 mmHg 9 mmHg 14 mmHg 117 bpm         Problems Addressed Today  No diagnosis found.    Assessment and Plan  1.  Heart failure with reduced ejection fraction: His most recent echocardiogram December 28, 2017 revealed an EF of 45%.  This is improved from 35% in February 2019.  He describes New [place] Heart Association functional class III symptoms today.  He has implanted CardioMEMS, his PA diastolic today is 10, and within his target range of 11-13.  He continues to take Bumex 2 mg daily, he tells me that he averages taking an extra dose about twice a week.  He is scheduled for follow-up with Dr. Sherryll Burger next on December 11, they are very appreciative of the care he has received from Dr. Sherryll Burger.  2.  Abdominal aortic aneurysm status post endovascular repair, and he is recently seeing Dr. Chales Abrahams And had a CT of the abdomen and pelvis, this revealed further mild decrease in size of the infrarenal abdominal aortic aneurysm sac status post aorto iliac stent graft repair, persistent geographic hepatic steatosis.  3.  Coronary artery disease with drug-eluting stent: He is not describing any anginal type symptoms today, he continues to take aspirin 81 mg daily, atorvastatin 40 mg daily.  His last ischemic work-up on Jun 29, 2017 showed 100% chronic total occlusion of the left circumflex obtuse marginal 2 now status post balloon angioplasty and drug-eluting stent with 2.5 x 38 Xience Sierra, patent stent in the mid RCA, 30% distal left main stenosis with circumferential calcium noted on the OCT.  4.  Ischemic cardiomyopathy: His GDMT includes spironolactone 25 mg twice daily.  Toprol and losartan have been discontinued due to orthostatic hypotension.  Of note he has had a previous kappa 3.5, lambda 3.64, PYP was negative for ATTR cardiac amyloidosis.  5.  Skin breakdown: I have sent a message to Dr. Crecencio Mc to see if Ruben Wood would need a referral to wound care clinic. 6.  OSA: He reports compliance with CPAP use.  Total visit of 25 minutes with [name]., of which 20 minutes were dedicated to advanced HF counseling/education and care coordination. Additional time was spent on reviewing interim medical documentation.   Provider Call Started at [phone]  Provider Call Ended at [phone]    Current Medications (including today's revisions)  ? acetaminophen (TYLENOL) 325 mg tablet Take  two tablets by mouth every 6 hours as needed for Pain.   ? albuterol 0.083% (PROVENTIL) 2.5 mg /3 mL (0.083 %) nebulizer solution Inhale 3 mL solution by nebulizer as directed every 6 hours as needed for Wheezing or Shortness of Breath. Indications: asthma   ? albuterol sulfate (PROAIR HFA) 90 mcg/actuation aerosol inhaler Inhale two puffs by mouth into the lungs every 4 hours as needed for Wheezing or Shortness of Breath.   ? allopurinoL (ZYLOPRIM) 300 mg tablet Take one tablet by mouth daily. Take with food.   ? AMITR/GABAPEN/EMU OIL 05/24/08% CREAM (COMPOUND) Apply 5 grams (10 pumps) topically to affected area three times daily.   ? Ammonium Lactate-Emu Oil (EMU-LAC) 10 % crea Apply  topically to affected area. Apply to feet    ? ascorbic acid (VITAMIN C) 500 mg tablet Take one tablet by mouth daily.   ? aspirin EC 81 mg tablet Take one tablet by mouth daily. Take with food.   ? atorvastatin (LIPITOR) 40 mg tablet TAKE (1) TABLET BY MOUTH ONCE DAILY   ? benzonatate (TESSALON PERLES) 100 mg capsule Take two capsules by mouth every 8 hours as needed for Cough.   ? budesonide-formoterol (SYMBICORT HFA) 160-4.5 mcg/actuation inhalation Inhale two puffs by mouth into the lungs twice daily. Rinse mouth after use.   ? bumetanide (BUMEX) 2 mg tablet Take one tablet by mouth daily. Take extra dose for swelling   ? cholecalciferol (VITAMIN D-3) 1,000 units tablet Take 1,000 Units by mouth daily.   ? docusate (COLACE) 100 mg capsule Take 100 mg by mouth daily as needed for Constipation. ? doxycycline (MONODOX) 100 mg capsule Take one capsule by mouth daily.   ? duloxetine DR (CYMBALTA) 60 mg capsule Take one capsule by mouth daily.   ? finasteride (PROSCAR) 5 mg tablet Take one tablet by mouth daily.   ? fish oil- omega 3-DHA/EPA 300/1,000 mg capsule Take 1 capsule by mouth daily.   ? gabapentin (NEURONTIN) 400 mg capsule Take 1 capsule by mouth three times daily as needed.   ? ipratropium (ATROVENT) 0.03 % nasal spray Apply two sprays to each nostril as directed every 12 hours.   ? montelukast (SINGULAIR) 10 mg tablet Take one tablet by mouth at bedtime daily.   ? nitroglycerin (NITROSTAT) 0.4 mg tablet Place 0.4 mg under tongue every 5 minutes as needed for Chest Pain. Max of 3 tablets, call 911.   ? nystatin (NYSTOP) 100,000 unit/g topical powder Apply  topically to affected area four times daily.   ? pantoprazole DR (PROTONIX) 40 mg tablet TAKE (1) TABLET BY MOUTH TWO TIMES A DAY.   ? polyethylene glycol 3350 (MIRALAX) 17 g packet Take one packet by mouth as Needed.   ? predniSONE (DELTASONE) 10 mg tablet Take three tablets by mouth daily. For four days, then 2 tablets daily until further directed by pulmonology   ? sodium chloride 3 % nebulizer solution Inhale 4 mL by mouth into the lungs twice daily. Use with [place].  Indications: asthma, chronic cough, bronchiectasis   ? spironolactone (ALDACTONE) 25 mg tablet Take one tablet by mouth twice daily. Take with food.   ? tamsulosin (FLOMAX) 0.4 mg capsule Take one capsule by mouth daily.   ? trimethoprim/sulfamethoxazole (BACTRIM DS) 160/800 mg tablet Take one tablet by mouth three times weekly.   ? zinc oxide 20 % topical ointment Place twice a day on the buttock wound  Telehealth Patient Reported Vitals     Row Name 01/24/19 1018                Weight:  129.3 kg (285 lb)        Height:  1.803 m (5' 11)        Pain Score:  Zero        Pain Score  Zero

## 2019-01-24 NOTE — Patient Instructions
Thank you for coming to [facility]. Your instructions today:     1.  Recommendations:   2.  Next follow up appointment in       For up to date information on the COVID-19 virus, visit the [facility] website.  ? General supportive care during cold and flu season and infection prevention reminders:   o Wash hands often with soap and water for at least 20 seconds  o Cover your mouth and nose  o Stay home if sick and symptoms mild or manageable  ? If you must be around people wear a mask    ? If you are having symptoms of a lower respiratory infection (cough, shortness of breath) and/or fever AND either traveled in last 30 days (internationally or to region of exposure) OR known exposure to patient with COVID19:    o Call your primary care provider for questions or health needs.   ? Tell your doctor about your recent travel and your symptoms    o In a medical emergency, call 911 or go to the nearest emergency room.      If you wish to contact us, please call and leave a message for the heart failure nurses at [phone] during regular office hours Mon-Fri, 8am-4pm. After hours and on holidays and weekends, please call: [phone] to reach the on call cardiologist for non urgent questions or issues.     To schedule or change an appointment call [phone].    Vanetta Shawl, MD  Herby Abraham, APRN  Mick Sell, RN  Magdalen Spatz, RN    [facility]  Phone: [phone] Fax: [phone]    Your Heart Failure Symptom Awareness and Action Plan  Every Day Action Plan  ? Weigh yourself in the morning before breakfast. Write it down and compare it to yesterday's weight.  ? Take your medicine, as prescribed. Please call if you have concerns about the side effects, cost or refills.  ? Check for worsened swelling in your feet, ankles and stomach  ? Follow a 2000mg  salt diet.  ? Keep all healthcare appointments    Green Zone Good! Symptoms are under control ? No shortness of breath  ? No increase in ankle swelling  ? No weight gain  ? No chest pain  ? No change in your usual activity  ? Continue to follow your everyday action plan.    Yellow Zone  If you have any of these symptoms, please call the heart failure nurses: [phone] ? Increased shortness of breath with activity  ? Weight gain of 3 pounds in one day or 5 pounds in a week  ? Increased swelling in your ankles or legs  ? Increased swelling in your stomach  ? Increasing fatigue  ? You may need an adjustment of your medications.    Red Zone  These are urgent symptoms. Please call the heart failure nurses: [phone] ? Shortness of breath at rest or waking up at night feeling short of breath or coughing  ? Increased number of pillows used or needing to sit upright to sleep  ? Chest tightness at rest  ? Dizziness, lightheadedness or feeling faint You need to schedule an appointment   Emergency Zone ? call 911 ? Worsening chest tightness or pain that is not relieved by medication  ? Severe shortness of breath and a cough with pink, frothy sputum

## 2019-01-27 ENCOUNTER — Ambulatory Visit: Admit: 2019-01-27 | Discharge: 2019-01-27 | Payer: MEDICARE

## 2019-01-27 DIAGNOSIS — I5042 Chronic combined systolic (congestive) and diastolic (congestive) heart failure: Secondary | ICD-10-CM

## 2019-01-28 ENCOUNTER — Encounter: Admit: 2019-01-28 | Discharge: 2019-01-28 | Payer: MEDICARE

## 2019-01-28 NOTE — Telephone Encounter
[facility] left VM asking about script for Afflovest.  Script signed 01/24/19 by Dr.Sharpe and faxed back to [facility] [phone]    Fax successful @ 1330.     Script sent to medical records to be scanned into the chart.

## 2019-01-31 ENCOUNTER — Ambulatory Visit: Admit: 2019-01-31 | Discharge: 2019-01-31 | Payer: MEDICARE

## 2019-01-31 ENCOUNTER — Encounter: Admit: 2019-01-31 | Discharge: 2019-01-31 | Payer: MEDICARE

## 2019-01-31 DIAGNOSIS — N401 Enlarged prostate with lower urinary tract symptoms: Secondary | ICD-10-CM

## 2019-01-31 DIAGNOSIS — R05 Cough: Secondary | ICD-10-CM

## 2019-01-31 DIAGNOSIS — M954 Acquired deformity of chest and rib: Secondary | ICD-10-CM

## 2019-01-31 DIAGNOSIS — J189 Pneumonia, unspecified organism: Secondary | ICD-10-CM

## 2019-01-31 DIAGNOSIS — L03116 Cellulitis of left lower limb: Secondary | ICD-10-CM

## 2019-01-31 DIAGNOSIS — G4733 Obstructive sleep apnea (adult) (pediatric): Secondary | ICD-10-CM

## 2019-01-31 DIAGNOSIS — N183 CKD (chronic kidney disease) stage 3, GFR 30-59 ml/min: Secondary | ICD-10-CM

## 2019-01-31 DIAGNOSIS — I714 Abdominal aortic aneurysm, without rupture: Secondary | ICD-10-CM

## 2019-01-31 DIAGNOSIS — E782 Mixed hyperlipidemia: Secondary | ICD-10-CM

## 2019-01-31 DIAGNOSIS — J302 Other seasonal allergic rhinitis: Secondary | ICD-10-CM

## 2019-01-31 DIAGNOSIS — I429 Cardiomyopathy, unspecified: Secondary | ICD-10-CM

## 2019-01-31 DIAGNOSIS — I5042 Chronic combined systolic (congestive) and diastolic (congestive) heart failure: Secondary | ICD-10-CM

## 2019-01-31 DIAGNOSIS — R609 Edema, unspecified: Secondary | ICD-10-CM

## 2019-01-31 DIAGNOSIS — K219 Gastro-esophageal reflux disease without esophagitis: Secondary | ICD-10-CM

## 2019-01-31 DIAGNOSIS — J479 Bronchiectasis, uncomplicated: Secondary | ICD-10-CM

## 2019-01-31 DIAGNOSIS — Z8614 Personal history of Methicillin resistant Staphylococcus aureus infection: Secondary | ICD-10-CM

## 2019-01-31 DIAGNOSIS — R0600 Dyspnea, unspecified: Secondary | ICD-10-CM

## 2019-01-31 DIAGNOSIS — I2510 Atherosclerotic heart disease of native coronary artery without angina pectoris: Secondary | ICD-10-CM

## 2019-01-31 DIAGNOSIS — I509 Heart failure, unspecified: Secondary | ICD-10-CM

## 2019-01-31 DIAGNOSIS — M961 Postlaminectomy syndrome, not elsewhere classified: Secondary | ICD-10-CM

## 2019-01-31 DIAGNOSIS — Z9981 Dependence on supplemental oxygen: Secondary | ICD-10-CM

## 2019-01-31 DIAGNOSIS — I10 Essential (primary) hypertension: Secondary | ICD-10-CM

## 2019-01-31 DIAGNOSIS — J449 Chronic obstructive pulmonary disease, unspecified: Secondary | ICD-10-CM

## 2019-01-31 DIAGNOSIS — J45909 Unspecified asthma, uncomplicated: Secondary | ICD-10-CM

## 2019-01-31 DIAGNOSIS — Z95828 Presence of other vascular implants and grafts: Secondary | ICD-10-CM

## 2019-01-31 DIAGNOSIS — Z8679 Personal history of other diseases of the circulatory system: Secondary | ICD-10-CM

## 2019-01-31 MED ORDER — JARDIANCE 10 MG PO TAB
10 mg | ORAL_TABLET | Freq: Every day | ORAL | 3 refills | Status: AC
Start: 2019-01-31 — End: ?

## 2019-01-31 NOTE — Progress Notes
Date of Service: 01/31/2019    Ruben Wood. Ruben Wood is a 78 y.o. male.       HPI    Ruben Wood is seen today in a 32-month follow up visit. 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 He had also previously been following with Dr. Vita Barley in the weight reduction clinic but since not following the diet has regained much of the weight. There have been several discussions about this patient and the potential for using immunoglobulin replacement therapy due to persistent hypogammaglobulinemia.     ?  He did see Dr. Harley Alto in an annual visit regarding his abdominal aortic aneurysm on 01/15/19 with a CT of the abdomen the same day revealing aneurysmal sac size continues to decrease, now measuring 4.2 cm, previously 5 cm one year ago.  He asked that the patient return in 1 year with repeat noncontrast CT of the abdomen and pelvis.  ? He has had 2 recent hospital admissions.  The first was from 10/14?12/15/2018 with acute respiratory failure and hypoxia as a result of COVID-19 infection.  He was followed by ID and completed convalescent plasma therapy, remdesivir, and 10 days of dexamethasone.  He was discharged to SNF on 10/25.  His midodrine was discontinued during this hospitalization.  Unfortunately he required readmission to [facility] 10/30?12/26/2018 with dyspnea/hypoxic respiratory failure.  Notably he had missed several days of diuretic while in a inpatient SNF the week prior.  He was treated in the MICU with IV diuresis, ceftriaxone for suspected UTI, and vancomycin for left lower extremity cellulitis.  He was also treated with prolonged high-dose dexamethasone taper due to concern for post Covid fibroproliferative pulmonary disorder.  He did have lower extremity arterial evaluation during his stay.  He was also followed by the cardiology team in regard to his heart failure.  He was discharged with Bumex 2 mg daily and spironolactone 25 mg twice daily.  ?  Blood pressure today is 126/60 mmHg and heart rate 106 bpm.  He is saturating 92% at room air.  He had echocardiogram done on October 30 which is revealing  moderately dilated LV with end-diastolic diameter of 6.8 cm and a EF of 51% by Simpson's technique.  He was also found to have moderate to severe mitral regurgitation.  This is new since 2019 echocardiogram  He continues to have some shortness of breath and fatigue which I believe is secondary to Covid infection.  His CardioMEMS PA diastolic pressure is at target today at 11 mmHg.      Vitals:    01/31/19 1058   BP: 126/60   Pulse: 106   Temp: 37.1 ?C (98.7 ?F)   TempSrc: Temporal   SpO2: 92%   Weight: 132.9 kg (293 lb)   Height: 1.803 m (5' 11)     Body mass index is 40.87 kg/m?[name]     Past Medical History  Patient Active Problem List    Diagnosis Date Noted   ? Complex care coordination 05/04/2017 Priority: High     Class: Acute   ? Acute cystitis 12/21/2018   ?  Pneumonia due to COVID-19 virus 12/05/2018   ? Chronic bronchitis with acute exacerbation (HCC) 12/05/2018   ? COVID-19 virus infection 12/05/2018   ? Heart failure with reduced ejection fraction (HCC) 12/05/2018   ? Cough 12/04/2018   ? 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 Type 2 diabetes mellitus with hyperglycemia, without long-term current use of insulin (HCC) 03/12/2018   ? Atrial fibrillation (HCC) 03/12/2018   ? Orthostatic hypotension 12/28/2017   ? Morbid obesity (HCC) 11/16/2017   ? Chronic respiratory failure with hypercapnia (HCC) 11/16/2017   ? Concussion with loss of consciousness of 30 minutes or less 11/16/2017   ? Acute pain due to trauma 11/15/2017   ? Episode of syncope 11/15/2017   ? Facial laceration 11/15/2017   ? Depression 11/15/2017   ? Closed traumatic dislocation of right shoulder 11/15/2017   ? Impaired mobility 11/15/2017   ? Trauma 11/14/2017   ? OAB (overactive bladder)      Urinary frequency, urgency, urge urinary incontinence (UUI), nocturia, nocturnal enuresis.  -- trial Mirabegron 25 mg --> improved, but persistent sx's.  -- trial Mirabegron 50 mg.     ? Urinary incontinence, urge      See OAB A&P note.     ? Urinary urgency      See OAB A&P note.     ? Nocturia      See OAB A&P note.     ? Nocturnal enuresis    ? Constipation    ? Swelling of left hand 10/07/2017     Resolved, unclear etiology.     ? Chronic combined systolic and diastolic CHF (congestive heart failure) (HCC) 07/13/2017   ? Upper extremity pain, anterior, right 07/03/2017   ? Right hand pain 07/03/2017   ? Coronary artery disease due to lipid rich plaque 06/29/2017   ? Oropharyngeal dysphagia 05/10/2017   ? Bronchiectasis with acute lower respiratory infection (HCC) 05/10/2017   ? Acute on chronic respiratory failure with hypoxia (HCC) 05/02/2017 ? Acute on chronic systolic congestive heart failure (HCC) 05/02/2017   ? Acute on chronic systolic and diastolic heart failure, NYHA class 3 (HCC) 03/27/2017   ? Urinary frequency 03/08/2017   ? 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     ? 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  D/c'd Tamsulosin d/t lack of symptom improvement.     ? Enrolled in chronic care management 02/28/2017   ? Elevated prostate specific antigen (PSA)      PNBx (03/01/2017): (L) high-grade prostatic intraepithelial neoplasia (HG PIN), 1/6 cores; [name], PA-C.     ? Spondylolisthesis, lumbar region 12/13/2016   ? Acute respiratory failure with hypoxia (HCC) 11/07/2016   ? Lactic acid acidosis 11/07/2016   ? Hyperglycemia 11/07/2016   ? Lumbar post-laminectomy syndrome      L4-5     ? Iron deficiency 10/09/2016   ? Spinal stenosis of lumbosacral region 09/20/2016   ? Spondylolisthesis of lumbosacral region 09/20/2016   ? Lumbar radiculopathy 09/20/2016   ? Osteoarthritis of spine with radiculopathy, lumbosacral region 09/20/2016   ? Venous ulcer of left leg (HCC) 09/13/2016   ? 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 Lower extremity edema 07/13/2016   ?  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 He had a normal response to pneumococca vaccination; he had 17/23 positive serotypes. He has normal tetanus toxoid Ab and CH50.  His T&B cell panel revealed a mildly low B-cell count at 68 right after a time of acute illness.  He had positive diphtheria antibody. Likely multifactorial with significant comorbidities including frequent steroid use for bronchiectasis exacerbations, frequent infections, and possibly a nutritional component (low total protein).    Currently on doxycycline once daily for recurrent infections, which he feels has been tremendously helpful.     Plan:  - Continue doxycycline 100 mg daily  - Recommend getting the pneumovax immunization next time he is in clinic in person and will need repeat pneumococcal antibody levels 1 month afterwards once he has had resolution of his COVID issues.          ? Moderate persistent asthma with acute exacerbation 02/25/2016     He has had asthma since sometime in his 20-30s. He gets cough, chest tightness, wheezing, shortness of breath that is year round with worsening in the Spring and Fall.   Triggers: URI, hot/cold air. Additionally he has been diagnosed with bronchiectasis and emphysema; his PFTs suggest both obstructive and restrictive disease and asthma is unlikely to be the sole cause of his dyspnea.    He has negative IgE immunocaps to aeroallergens which makes the possibility of allergic asthma highly unlikely. His IgE level was 17 and he had negative ANCAs, MPO and PR3.     Repeatedly being treated with steroids now for flares.  Also on hypertonic saline 3 times a day although not currently using them), DuoNebs, Symbicort, and Singulair as well as aggressive pulmonary clearance with vest and Aerobika.        ? 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 He is not having issues with urticaria.    - We recommend IgE immunocaps to aeroallergens.      ? Chronic Rhinoconjunctivitis 02/25/2016     Perennial, since childhood.  Nasal sprays are not helpful (Flonase, Nasacort) and patient had incorrect technique with sprays with resultant nosebleeds.  AIT several times in his life, only the first round was helpful.  02/2016 - CT sinuses normal.  02/2016 - Aeroallergen IgE negative. Currently on Zyrtec 10mg  daily and Singulair 10mg  daily off flunisolide and Atrovent.   Also has significant drip when he eats, concerning for gustatory rhinitis.      ? Recurrent infections 02/25/2016     Recurrent sinopulmonary infections requiring antibiotics 5-10 times per year.   Cellulitis and non-healing wounds.   Many underlying illnesses predisposing him to these infections in addition to intermittent hypogammaglobulinemia associated with active infections and systemic steroids.      ? Postoperative visit 02/23/2016   ? S/P left pulmonary artery pressure sensor implant placement (CardioMEMs)  02/02/2016     * Patient has CardioMEMs (Pulmonary Artery Pressure Sensor)* Please call Matthew Folks, CardioMEMs Program Coordinator [phone] or page Heart Failure Rounding Team if patient is admitted or presents to ED*  03/30/15 implant     ? Ischemic cardiomyopathy 01/12/2016   ? Other male erectile dysfunction 01/12/2016   ? Wound of skin 01/12/2016   ? 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 Varicose veins of left lower extremity with pain 01/05/2016   ? ICD (implantable cardioverter-defibrillator),  biventricular, in situ 12/07/2015   ? Hypercholesterolemia 11/25/2015   ? 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 Bronchiectasis without complication (HCC) 06/18/2015     Non-sputum producer.  Currently on Symbicort.  - Dr. [name] was able to get him a vest to wear at home for secretions and this has helped immensely with his secretions and breathing.      ? Abnormal cortisol level 12/25/2014   ? Venous stasis dermatitis of both lower extremities 11/04/2014 ? Sacroiliac dysfunction 07/28/2014   ? Osteoarthritis of spine with radiculopathy, lumbar region 07/28/2014   ? Chronic combined systolic and diastolic congestive heart failure, NYHA class 2 (HCC) 07/19/2014     11/04/14: EF 20%, severely dilated LV with grade 1 diastolic dysfunction.  11/12/2014 In clinic today, patient appears fairly well compensated.  He does have some LEE, but no significant rales (just some at bases), no JVD sitting upright, and no sx to suggest decompensation.  We will continue metoprolol, spironolactone, and bumex at current doses.    11/17/2014 Edema much improved in lower ext, Cr trending down on last labs.  Will recheck labs today.  Advised to weigh himself daily.  Rechecking BMP today to ensure not overdoing diuretics.     On metoprolol, spironolactone.  Will need to explore allergy to ARB more as patient may benefit from ACEI.      03/30/15 CardioMEMS implant by Dr. Chales Abrahams  1. Normal cardiac output and cardiac index.  2. Normal pulmonary pressures and normal pulmonary capillary wedge pressure.  3. Successful insertion of CardioMEMS pulmonary artery pressure sensor     ? 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      ? CKD (chronic kidney disease) 08/26/2013   ? Mixed dyslipidemia 08/26/2013     Hepatic Function    Lab Results   Component Value Date/Time    ALBUMIN 4.0 11/04/2014 12:26 AM    TOTAL PROTEIN 6.9 11/04/2014 12:26 AM    ALK PHOSPHATASE 61 11/04/2014 12:26 AM    Lab Results   Component Value Date/Time    AST (SGOT) 41* 11/04/2014 12:26 AM    ALT (SGPT) 19 11/04/2014 12:26 AM TOTAL BILIRUBIN 1.4* 11/04/2014 12:26 AM        Lab Results   Component Value Date    CHOL 148 12/15/2013    TRIG 122 12/15/2013    HDL 51 12/15/2013    LDL 88 12/15/2013    VLDL 24 12/15/2013    NONHDLCHOL 97 12/15/2013       BP Readings from Last 3 Encounters:   11/12/14 129/80   11/09/14 111/44   11/04/14 146/65     Plan:   78 y.o. with known CAD with CTO of RCA and obtuse marginal branch with high grade stenosis (90%) in mid left circ.  Advised heart healthy diet and daily exercise.    Continue high intensity statin, atorvastatin       ? AAA (abdominal aortic aneurysm) (HCC) 10/15/2012     Duplex done at OSH in 2007 and 2008 ~ 4.1 cm    Duplex 10/15/12-AAA 7.3 cm AP x 7.3 cm       ? History of MRSA infection 10/14/2012   ? Chronic cough 08/29/2012   ? CAD (coronary artery disease), native coronary artery 08/15/2012     07/11/2002- Cath @ [facility] showed Severe double vessel disease(OMB of circumflex and distal circ)  inferior-basilar dyskinesis.                  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    09/12/12   Cath - [place]:  CTO of the right coronary artery, CTO of the obtuse marginal branch and high-grade stenosis of   90% in the mid left circumflex artery No significant stenosis in the left anterior descending artery or left main vessel     Failed attempt in opening 2nd obtuse marginal CTO as described above.    10/14/12: Unsuccessful attempt to open CTO of OMB and mid-CFX by Dr. Mackey Birchwood      04/02/17: Cath by Dr. Steward Ros:   4. Chronic total occlusion of the circumflex.  5. Chronic total occlusion of the right coronary artery, status post percutaneous coronary intervention with drug-eluting stent times 2.  6. Small distal right coronary artery perforation without echo or hemodynamic evidence of compromise. 06/29/17-cardiac catheterization by Dr. Steward Ros. One DES placed to chronic total occlusion of the circumflex artery. The RCA stent was patent.     ? OSA on CPAP 08/13/2012     CPAP at  DME: [facility]    Split night:  08/14/2017  AHI 43.2  REM n/a  Time below 88 %: 98 mins  Lowest sat: 83%     CPAP 10 cm h20 effective      ? COPD (chronic obstructive pulmonary disease) (HCC) 08/13/2012     04/28/13: PFTs with more restrictive pattern with FEV1 50%, FEV1/FVC 89%, DLCO 65%.    12/27/13: PFTs with FEV1 82%, FEV1/FVC 113%, and DLCO 77%     ? GERD (gastroesophageal reflux disease) 08/13/2012     -PPI  -follows with GI     ? 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  Discussed the importance of diet.  Exercise as tolerated.        ? Essential hypertension 08/13/2012   ? Chest wall deformity 07/09/2012     Chest wall reconstruction on 07/09/12  Lung herniation- bio bridge           Review of Systems   Constitution: Positive for decreased appetite and malaise/fatigue.   HENT: Negative.    Eyes: Negative.    Cardiovascular: Negative.    Respiratory: Positive for sputum production.    Endocrine: Negative.    Hematologic/Lymphatic: Negative.    Skin: Negative.    Musculoskeletal: Negative.    Gastrointestinal: Positive for nausea.  Genitourinary: Negative.    Neurological: Negative.    Psychiatric/Behavioral: Negative.    Allergic/Immunologic: Negative.    All other systems reviewed and are negative.      Physical Exam  General Appearance: no acute distress, obese  Skin: warm &?intact ?  HEENT: EOMI, mucous membranes moist, oropharynx is clear ?  Neck Veins: neck veins are flat &?not distended ?  Carotid Arteries: no bruits ?  Chest Inspection: chest incisions are well healed ? Auscultation/Percussion: lungs clear to auscultation, no rales, rhonchi, or wheezing ?  Cardiac Rhythm: regular rhythm &?normal rate ?  Cardiac Auscultation: distant heart tones, but otherwise normal S1 &?S2, no S3 or S4, no rub ?  Murmurs: no cardiac murmurs ?  Extremities:?1-2+ bilateral pedal edema  Abdominal Exam: obese, non-tender, no masses, bowel sounds+, nonpalpable abdominal aorta ?  Liver &?Spleen: difficult to assess due to obese body habitus ?  Neurologic Exam: oriented to time, place and person; no focal neurologic deficit    Cardiovascular Studies      Problems Addressed Today  No diagnosis found.    Assessment and Plan    1. ?Heart failure with reduced ejection fraction. ?Last EF was is around 45%.  Repeat echo I believe the EF is around the same range however there is slight LV dilation. ?NYHA class IV stage C heart failure. ?NYHA class IV could be secondary to his obesity and pulmonary disease also. ?He is?at optimal?pulmonary artery diastolic pressures from his CardioMEMS.      I saw him last time on 08/05/2018.  He had gained some weight therefore we increase Bumex to 2 mg by mouth once daily.  He has done remarkably well since then.  CardioMEMS numbers have been at target.  I will see if we can get Jardiance for him.    I repeated chest x-ray to make sure he does not have pneumonia.  X-ray does not reveal any consolidation.  I believe he continues to have basal atelectasis and recommended him to use incentive spirometry which we provided.    Looking at his echocardiogram I believe mitral regurgitation is mild to moderate and not more than that.  I believe repeating echocardiogram in 3 months is optimal.    I will work with our pharmacy to get him Jardiance approved.  ?  ?  We will continue to follow him closely  ?  ? 40 minutes of time spent with patient and family. ?Greater than 30 minutes of this time spent counseling regarding heart failure with reduced ejection fraction, medications and volume control.  Vanetta Shawl M.D  [facility] Failure and Transplant Cardiologist    Current Medications (including today's revisions)  ? acetaminophen (TYLENOL) 325 mg tablet Take two tablets by mouth every 6 hours as needed for Pain.   ? albuterol 0.083% (PROVENTIL) 2.5 mg /3 mL (0.083 %) nebulizer solution Inhale 3 mL solution by nebulizer as directed every 6 hours as needed for Wheezing or Shortness of Breath. Indications: asthma   ? albuterol sulfate (PROAIR HFA) 90 mcg/actuation aerosol inhaler Inhale two puffs by mouth into the lungs every 4 hours as needed for Wheezing or Shortness of Breath.   ? allopurinoL (ZYLOPRIM) 300 mg tablet Take one tablet by mouth daily. Take with food.   ? AMITR/GABAPEN/EMU OIL 05/24/08% CREAM (COMPOUND) Apply 5 grams (10 pumps) topically to affected area three times daily.   ? Ammonium Lactate-Emu Oil (EMU-LAC) 10 % crea Apply  topically to affected area. Apply to feet    ?  ascorbic acid (VITAMIN C) 500 mg tablet Take one tablet by mouth daily.   ? aspirin EC 81 mg tablet Take one tablet by mouth daily. Take with food.   ? atorvastatin (LIPITOR) 40 mg tablet TAKE (1) TABLET BY MOUTH ONCE DAILY   ? azithromycin (ZITHROMAX) 250 mg tablet    ? benzonatate (TESSALON PERLES) 100 mg capsule Take two capsules by mouth every 8 hours as needed for Cough.   ? budesonide-formoterol (SYMBICORT HFA) 160-4.5 mcg/actuation inhalation Inhale two puffs by mouth into the lungs twice daily. Rinse mouth after use.   ? bumetanide (BUMEX) 2 mg tablet Take one tablet by mouth daily. Take extra dose for swelling   ? cholecalciferol (VITAMIN D-3) 1,000 units tablet Take 1,000 Units by mouth daily.   ? docusate (COLACE) 100 mg capsule Take 100 mg by mouth daily as needed for Constipation. ? doxycycline (MONODOX) 100 mg capsule Take one capsule by mouth daily.   ? duloxetine DR (CYMBALTA) 60 mg capsule Take one capsule by mouth daily.   ? finasteride (PROSCAR) 5 mg tablet Take one tablet by mouth daily.   ? fish oil- omega 3-DHA/EPA 300/1,000 mg capsule Take 1 capsule by mouth daily.   ? fluconazole (DIFLUCAN) 150 mg tablet    ? gabapentin (NEURONTIN) 400 mg capsule Take 1 capsule by mouth three times daily as needed.   ? ipratropium (ATROVENT) 0.03 % nasal spray Apply two sprays to each nostril as directed every 12 hours.   ? montelukast (SINGULAIR) 10 mg tablet Take one tablet by mouth at bedtime daily.   ? nitroglycerin (NITROSTAT) 0.4 mg tablet Place 0.4 mg under tongue every 5 minutes as needed for Chest Pain. Max of 3 tablets, call 911.   ? nystatin (NYSTOP) 100,000 unit/g topical powder Apply  topically to affected area four times daily.   ? pantoprazole DR (PROTONIX) 40 mg tablet TAKE (1) TABLET BY MOUTH TWO TIMES A DAY.   ? polyethylene glycol 3350 (MIRALAX) 17 g packet Take one packet by mouth as Needed.   ? predniSONE (DELTASONE) 10 mg tablet Take three tablets by mouth daily. For four days, then 2 tablets daily until further directed by pulmonology   ? sodium chloride 3 % nebulizer solution Inhale 4 mL by mouth into the lungs twice daily. Use with [place].  Indications: asthma, chronic cough, bronchiectasis   ? spironolactone (ALDACTONE) 25 mg tablet Take one tablet by mouth twice daily. Take with food.   ? tamsulosin (FLOMAX) 0.4 mg capsule Take one capsule by mouth daily.   ? trimethoprim/sulfamethoxazole (BACTRIM DS) 160/800 mg tablet Take one tablet by mouth three times weekly.   ? zinc oxide 20 % topical ointment Place twice a day on the buttock wound

## 2019-01-31 NOTE — Progress Notes
Daily CardioMEMS Readings    Goal PA Diastolic: 17-40 mmHg   PA Diastolic Thresholds: 8-14 mmHg      Taken on PA Systolic PA Diastolic PA Mean Heart Rate   01-31-2019, 07:36 AM 29 mmHg 11 mmHg 17 mmHg 127 bpm   01-30-2019, 10:21 AM 34 mmHg 14 mmHg 20 mmHg 117 bpm   01-29-2019, 08:47 AM 24 mmHg 9 mmHg 14 mmHg 127 bpm   01-27-2019, 08:07 AM 24 mmHg 8 mmHg 14 mmHg 122 bpm   01-25-2019, 09:45 AM 30 mmHg 11 mmHg 17 mmHg 108 bpm   01-24-2019, 08:02 AM 26 mmHg 10 mmHg 16 mmHg 115 bpm   01-23-2019, 10:51 AM 33 mmHg 10 mmHg 17 mmHg 108 bpm   01-22-2019, 07:02 AM 25 mmHg 9 mmHg 14 mmHg 117 bpm   01-21-2019, 08:11 AM 25 mmHg 9 mmHg 14 mmHg 108 bpm   01-20-2019, 08:32 AM 27 mmHg 9 mmHg 15 mmHg 110 bpm   01-19-2019, 01:51 PM 27 mmHg 10 mmHg 16 mmHg 120 bpm   01-18-2019, 09:08 AM 29 mmHg 9 mmHg 15 mmHg 113 bpm   01-17-2019, 07:28 AM 30 mmHg 8 mmHg 16 mmHg 105 bpm   01-16-2019, 12:00 PM 27 mmHg 8 mmHg 14 mmHg 119 bpm   01-15-2019, 06:49 AM 27 mmHg 10 mmHg 16 mmHg 108 bpm   01-14-2019, 08:28 AM 30 mmHg 11 mmHg 17 mmHg 112 bpm   01-13-2019, 07:54 AM 30 mmHg 8 mmHg 15 mmHg 115 bpm   01-12-2019, 08:06 PM 30 mmHg 8 mmHg 15 mmHg 98 bpm   01-12-2019, 09:05 AM 34 mmHg 11 mmHg 19 mmHg 104 bpm   01-11-2019, 07:19 PM 36 mmHg 11 mmHg 19 mmHg 99 bpm   01-11-2019, 07:01 AM 31 mmHg 9 mmHg 17 mmHg 96 bpm   01-10-2019, 08:54 PM 29 mmHg 9 mmHg 16 mmHg 103 bpm   01-10-2019, 07:49 AM 28 mmHg 10 mmHg 16 mmHg 104 bpm   01-09-2019, 08:15 PM 33 mmHg 10 mmHg 18 mmHg 109 bpm   01-09-2019, 07:35 AM 31 mmHg 11 mmHg 17 mmHg 83 bpm   01-08-2019, 08:13 PM 34 mmHg 11 mmHg 19 mmHg 110 bpm   01-08-2019, 07:20 AM 31 mmHg 11 mmHg 18 mmHg 103 bpm   01-07-2019, 07:44 PM 32 mmHg 10 mmHg 18 mmHg 101 bpm   01-07-2019, 07:22 AM 30 mmHg 9 mmHg 15 mmHg 95 bpm

## 2019-02-03 ENCOUNTER — Encounter: Admit: 2019-02-03 | Discharge: 2019-02-03 | Payer: MEDICARE

## 2019-02-03 NOTE — Telephone Encounter
Patient's wife called concerned because her husband is having extreme swelling and pain to his legs left is worse than right, but it is quite swollen especially in the calf area.  Asking if anything could be done at [facility].    Routing to Dr. Gibson Ramp to advise if I should recommend ED due to ? Blood clot.

## 2019-02-03 NOTE — Telephone Encounter
I called patient's wife and I discussed concern for blood clot, and she is also wondering if it could be gout.    I advised that patient really needed to be seen in order to assess what's going on.  She was in agreement.  She will call Dr. Carlis Abbott to see if he can be seen there close to his home otherwise will let me know so I can get him in with a covering provider.    Myrtie Hawk, RN

## 2019-02-04 ENCOUNTER — Encounter: Admit: 2019-02-04 | Discharge: 2019-02-04 | Payer: MEDICARE

## 2019-02-05 ENCOUNTER — Encounter: Admit: 2019-02-05 | Discharge: 2019-02-05 | Payer: MEDICARE

## 2019-02-06 MED ORDER — DOXYCYCLINE MONOHYDRATE 100 MG PO CAP
ORAL_CAPSULE | Freq: Every day | 5 refills | 8.00000 days | Status: AC
Start: 2019-02-06 — End: ?

## 2019-02-07 ENCOUNTER — Encounter: Admit: 2019-02-07 | Discharge: 2019-02-07 | Payer: MEDICARE

## 2019-02-07 ENCOUNTER — Ambulatory Visit: Admit: 2019-02-07 | Discharge: 2019-02-08 | Payer: MEDICARE

## 2019-02-07 DIAGNOSIS — Z8679 Personal history of other diseases of the circulatory system: Secondary | ICD-10-CM

## 2019-02-07 DIAGNOSIS — Z9981 Dependence on supplemental oxygen: Secondary | ICD-10-CM

## 2019-02-07 DIAGNOSIS — N183 CKD (chronic kidney disease) stage 3, GFR 30-59 ml/min: Secondary | ICD-10-CM

## 2019-02-07 DIAGNOSIS — J479 Bronchiectasis, uncomplicated: Secondary | ICD-10-CM

## 2019-02-07 DIAGNOSIS — J449 Chronic obstructive pulmonary disease, unspecified: Secondary | ICD-10-CM

## 2019-02-07 DIAGNOSIS — I714 Abdominal aortic aneurysm, without rupture: Secondary | ICD-10-CM

## 2019-02-07 DIAGNOSIS — J189 Pneumonia, unspecified organism: Secondary | ICD-10-CM

## 2019-02-07 DIAGNOSIS — L03116 Cellulitis of left lower limb: Secondary | ICD-10-CM

## 2019-02-07 DIAGNOSIS — I429 Cardiomyopathy, unspecified: Secondary | ICD-10-CM

## 2019-02-07 DIAGNOSIS — M954 Acquired deformity of chest and rib: Secondary | ICD-10-CM

## 2019-02-07 DIAGNOSIS — K219 Gastro-esophageal reflux disease without esophagitis: Secondary | ICD-10-CM

## 2019-02-07 DIAGNOSIS — G4733 Obstructive sleep apnea (adult) (pediatric): Secondary | ICD-10-CM

## 2019-02-07 DIAGNOSIS — I2510 Atherosclerotic heart disease of native coronary artery without angina pectoris: Secondary | ICD-10-CM

## 2019-02-07 DIAGNOSIS — J45909 Unspecified asthma, uncomplicated: Secondary | ICD-10-CM

## 2019-02-07 DIAGNOSIS — I872 Venous insufficiency (chronic) (peripheral): Secondary | ICD-10-CM

## 2019-02-07 DIAGNOSIS — I5042 Chronic combined systolic (congestive) and diastolic (congestive) heart failure: Secondary | ICD-10-CM

## 2019-02-07 DIAGNOSIS — R609 Edema, unspecified: Secondary | ICD-10-CM

## 2019-02-07 DIAGNOSIS — E782 Mixed hyperlipidemia: Secondary | ICD-10-CM

## 2019-02-07 DIAGNOSIS — Z95828 Presence of other vascular implants and grafts: Secondary | ICD-10-CM

## 2019-02-07 DIAGNOSIS — I509 Heart failure, unspecified: Secondary | ICD-10-CM

## 2019-02-07 DIAGNOSIS — J302 Other seasonal allergic rhinitis: Secondary | ICD-10-CM

## 2019-02-07 DIAGNOSIS — R0600 Dyspnea, unspecified: Secondary | ICD-10-CM

## 2019-02-07 DIAGNOSIS — R05 Cough: Secondary | ICD-10-CM

## 2019-02-07 DIAGNOSIS — M961 Postlaminectomy syndrome, not elsewhere classified: Secondary | ICD-10-CM

## 2019-02-07 DIAGNOSIS — I10 Essential (primary) hypertension: Secondary | ICD-10-CM

## 2019-02-07 DIAGNOSIS — N401 Enlarged prostate with lower urinary tract symptoms: Secondary | ICD-10-CM

## 2019-02-07 DIAGNOSIS — Z8614 Personal history of Methicillin resistant Staphylococcus aureus infection: Secondary | ICD-10-CM

## 2019-02-07 MED ORDER — BACITRACIN ZINC 500 UNIT/GRAM TP OINT
Freq: Three times a day (TID) | 1 refills | 14.00000 days | Status: DC
Start: 2019-02-07 — End: 2019-07-28

## 2019-02-07 NOTE — Telephone Encounter
Ruben Boards, MD  Ruben Sidles, RN; P Cvm Nurse Hf Team [place]            No change, BMP IN 2 WEEKS      Called patient and spoke to wife on recent lab results resulted 12/17 with Dr. Trena Platt recommendations. She verbalizes understanding. BMP lab faxed to [place] as preferred per pt's wife.    She states she noticed patient has lost weight since last visit, and is unsure if this is due to his new medication Jardiance.    Weight today 12/18 is 284 lb. On-going SOB, improved with spirometry provided at [facility] 12/11. Will route to provider and pharm for recs.

## 2019-02-07 NOTE — Progress Notes
Did patient read financial policy, consent to treat, and notice of privacy practices? Yes    Does the patient give verbal consent to each policy? Yes    Does the patient have any vitals to report? Yes    Temperature, Pulse, Blood Pressure and Oxygen Vitals charted in O2? Yes    Is the patient in pain? 5/10    Screening questions completed? Yes    Is the patient able to acces the Mychart message with the start visit link? Yes    Is patient in "virtual waiting room" Yes    Myrtie Hawk, RN

## 2019-02-07 NOTE — Patient Instructions
Future Appointments   Date Time Provider Department Center   02/10/2019  4:40 PM Aloysius Heinle, [name], MD [facility]ENMED IM   02/27/2019 12:00 AM [facility]CR[facility]M CVM Procedur   03/03/2019  4:00 PM Vena Austria, MD [facility] CVM Exam   03/14/2019 11:00 AM Lady Saucier, MD [facility]APULM IM   03/31/2019 12:00 AM [facility]CREMOTEHRM CVM Procedur   05/01/2019 12:00 AM [facility]OTE MONIT[facility] MACREMOTEHRM CVM Procedur   05/16/2019  2:00 PM [place] [facility] 2 [facility] CVM Procedur   05/16/2019  3:30 PM Vanetta Shawl I, MD MACHFC CVM Exam   06/02/2019 12:00 AM [facility] MACREMOTEHRM CVM Procedur   07/03/2019 12:00 AM [facility] REMOTE MONIT[facility]ING MACREMOTEHRM CVM Procedur   08/04/2019 12:00 AM [facility]OTE [facility]IT[facility] MACREMOTEHRM CVM Procedur   09/04/2019 12:00 AM [facility]OTE [facility]IT[facility] MACREMOTEHRM CVM Procedur   10/06/2019 12:00 AM [facility]OTE [facility]IT[facility] MACREMOTEHRM CVM Procedur   11/06/2019 12:00 AM [facility]CREMOTEHRM CVM Procedur   12/08/2019 12:00 AM [facility]OTE [facility]IT[facility]ING MACREMOTEHRM CVM Procedur   01/08/2020 12:00 AM [facility]OTE [facility]IT[facility]ING MACREMOTEHRM CVM Procedur   02/09/2020 12:00 AM [facility]OTE MONIT[facility]ING MACREMOTEHRM CVM Procedur   03/11/2020 12:00 AM [facility]OTE MONIT[facility] MACREMOTEHRM CVM Procedur   04/12/2020 12:00 AM [facility] REMOTE MONITORING MACREMOTEHRM CVM Procedur   05/13/2020 12:00 AM [facility]OTE [facility]IT[facility]ING MACREMOTEHRM CVM Procedur   06/14/2020 12:00 AM [facility] REMOTE MONITORING MACREMOTEHRM CVM Procedur       If you are on Mychart, you will now be able to read your clinic note from me.  My hope is that this will improve your engagement and insight into your health care and improve the accuracy of your medical record.  If you find inaccuracies, I apologize.  Please bring any concerns or inaccuracies to your next appointment.  Please remember that medical language and documentation is very different from how you and I speak and our dictation software is not perfect.    General Instructions: ? How to reach me:   Please send a MyChart message to the General Medicine clinic or call Wynona Canes at [phone].    ? How to get a medication refill:  Please use the MyChart Refill request or contact your pharmacy directly to request medication refills. Please allow 48 hours.     ? How to receive your test results:  If you have signed up for MyChart, you will receive your test results and messages from me this way.  Otherwise, you will get a phone call or letter.   If you are expecting results and have not heard from my office within 2 weeks of your testing, please send a MyChart message or call my office.     ? Scheduling:  Our Scheduling phone number is [phone].  Same Day appointments are usually available. Please ask for an annual or yearly physical appointment if it has been over 1 year since our last appointment.  ? Appointment Reminders on your cell phone: Make sure we have your cell phone number, and Text [facility] to [phone].  ? Support groups for many chronic illnesses are available through [facility]: [email or web address] or [phone].

## 2019-02-07 NOTE — Progress Notes
History of Present Illness    Ruben Wood. is a 78 y.o. male with multiple medical problems including CAD, Systolic and Diastolic HF, HLD, OSA on CPAP, morbid obesity, chronic pain, and COPD who presents today for acute visit.     Obtained patient's verbal consent to treat them and their agreement to [facility] financial policy and NPP via this telehealth visit during the [facility] Emergency    Ruben Wood has been having lower extremity swelling.  This is worse later in the day.  It tends to be better first thing in the morning.  He has shortness of breath and dyspnea on exertion, but this is at his baseline.  He reports that his CardioMEMS readings have been stable.  He does have known heart failure with reduced ejection fraction.  He does not have any skin breakdown of the lower extremities, but he has noticed a knot underneath it and just distal to his right knee.  This is just above where his compression stocking line is.    He has a decubitus ulcer.  This can be painful.  He has home health nursing coming out a dressing that every week.  He has been putting Vaseline on it only.  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  He reports that that is under better control currently.  He denies any drainage from the decubitus ulcer.      Review of Systems   Constitutional: Negative for chills and fever.   Respiratory: Positive for shortness of breath. Negative for chest tightness.    Cardiovascular: Positive for leg swelling.   Skin: Positive for wound. Negative for color change.       Objective:         ? acetaminophen (TYLENOL) 325 mg tablet Take two tablets by mouth every 6 hours as needed for Pain.   ? albuterol 0.083% (PROVENTIL) 2.5 mg /3 mL (0.083 %) nebulizer solution Inhale 3 mL solution by nebulizer as directed every 6 hours as needed for Wheezing or Shortness of Breath. Indications: asthma ? albuterol sulfate (PROAIR HFA) 90 mcg/actuation aerosol inhaler Inhale two puffs by mouth into the lungs every 4 hours as needed for Wheezing or Shortness of Breath.   ? allopurinoL (ZYLOPRIM) 300 mg tablet Take one tablet by mouth daily. Take with food.   ? AMITR/GABAPEN/EMU OIL 05/24/08% CREAM (COMPOUND) Apply 5 grams (10 pumps) topically to affected area three times daily.   ? Ammonium Lactate-Emu Oil (EMU-LAC) 10 % crea Apply  topically to affected area. Apply to feet    ? ascorbic acid (VITAMIN C) 500 mg tablet Take one tablet by mouth daily.   ? aspirin EC 81 mg tablet Take one tablet by mouth daily. Take with food.   ? atorvastatin (LIPITOR) 40 mg tablet TAKE (1) TABLET BY MOUTH ONCE DAILY   ? bacitracin 500 unit/g topical ointment Apply to affected area TID   ? benzonatate (TESSALON PERLES) 100 mg capsule Take two capsules by mouth every 8 hours as needed for Cough.   ? budesonide-formoterol (SYMBICORT HFA) 160-4.5 mcg/actuation inhalation Inhale two puffs by mouth into the lungs twice daily. Rinse mouth after use.   ? bumetanide (BUMEX) 2 mg tablet Take one tablet by mouth daily. Take extra dose for swelling   ? cholecalciferol (VITAMIN D-3) 1,000 units tablet Take 1,000 Units by mouth daily.   ? docusate (COLACE) 100 mg capsule Take 100 mg by mouth daily as needed for Constipation.   ? doxycycline (  MONODOX) 100 mg capsule Take one capsule by mouth daily.   ? duloxetine DR (CYMBALTA) 60 mg capsule Take one capsule by mouth daily.   ? empagliflozin (JARDIANCE) 10 mg tablet Take one tablet by mouth daily.   ? finasteride (PROSCAR) 5 mg tablet Take one tablet by mouth daily.   ? fish oil- omega 3-DHA/EPA 300/1,000 mg capsule Take 1 capsule by mouth daily.   ? gabapentin (NEURONTIN) 400 mg capsule Take 1 capsule by mouth three times daily as needed.   ? ipratropium (ATROVENT) 0.03 % nasal spray Apply two sprays to each nostril as directed every 12 hours. ? montelukast (SINGULAIR) 10 mg tablet Take one tablet by mouth at bedtime daily.   ? nitroglycerin (NITROSTAT) 0.4 mg tablet Place 0.4 mg under tongue every 5 minutes as needed for Chest Pain. Max of 3 tablets, call 911.   ? nystatin (NYSTOP) 100,000 unit/g topical powder Apply  topically to affected area four times daily.   ? pantoprazole DR (PROTONIX) 40 mg tablet TAKE (1) TABLET BY MOUTH TWO TIMES A DAY.   ? polyethylene glycol 3350 (MIRALAX) 17 g packet Take one packet by mouth as Needed.   ? sodium chloride 3 % nebulizer solution Inhale 4 mL by mouth into the lungs twice daily. Use with [place].  Indications: asthma, chronic cough, bronchiectasis   ? spironolactone (ALDACTONE) 25 mg tablet Take one tablet by mouth twice daily. Take with food.   ? tamsulosin (FLOMAX) 0.4 mg capsule Take one capsule by mouth daily.     There were no vitals filed for this visit.    There is no height or weight on file to calculate BMI.     Wt Readings from Last 5 Encounters:   01/31/19 132.9 kg (293 lb)   01/15/19 131.5 kg (290 lb)   01/03/19 129.3 kg (285 lb)   12/31/18 129.3 kg (285 lb)   12/21/18 127.5 kg (281 lb)       Physical Exam  Constitutional:       Comments: Frail appearing   HENT:      Head: Normocephalic and atraumatic.   Pulmonary:      Effort: Pulmonary effort is normal.      Comments: Wearing oxygen  Musculoskeletal:      Right lower leg: Edema present.      Left lower leg: Edema present.   Neurological:      General: No focal deficit present.      Mental Status: He is alert.   Psychiatric:         Mood and Affect: Mood normal.       Labs Reviewed:   Lab Results   Component Value Date/Time    HGBA1C 6.3 (H) 12/05/2018 01:57 AM    HGBA1C 6.7 (H) 08/12/2018    HGBA1C 6.8 (H) 04/03/2017 04:39 AM    HGBPOC 11.2 (L) 10/25/2012 10:58 AM    HGBPOC 12.9 (L) 10/25/2012 08:55 AM    TSH 4.37 12/20/2018 08:09 PM    FREET4R 0.99 12/15/2014 09:12 AM    CHOL 106 12/05/2018 01:57 AM    TRIG 108 12/05/2018 01:57 AM HDL 31 (L) 12/05/2018 01:57 AM    LDL 60 12/05/2018 01:57 AM    NA 139 02/06/2019    K 3.9 02/06/2019    CL 100 02/06/2019    CO2 28.3 02/06/2019    GAP 9 12/31/2018 12:04 PM    BUN 25.0 (H) 02/06/2019    CR 1.5 (H) 02/06/2019  GLU 115 (H) 02/06/2019    CA 9.0 02/06/2019    PO4 3.2 12/21/2018 10:34 PM    ALBUMIN 2.9 (L) 12/26/2018 05:19 AM    TOTPROT 5.7 (L) 12/26/2018 05:19 AM    ALKPHOS 69 12/26/2018 05:19 AM    AST 22 12/26/2018 05:19 AM    ALT 38 12/26/2018 05:19 AM    TOTBILI 0.3 12/26/2018 05:19 AM    GFR 48 02/06/2019    GFRAA >60 12/31/2018 12:04 PM    PSA 2.96 11/25/2018         Assessment and Plan:    1. Pressure injury of skin of buttock, unspecified injury stage, unspecified laterality    2. Venous insufficiency    3. Venous stasis dermatitis of both lower extremities    4. Chronic combined systolic and diastolic congestive heart failure, NYHA class 2 (HCC)      For the decubitus ulcer, I sent in a bacitracin zinc combination ointment.  I talked about the importance of offloading this is much as possible.  I also talked with importance of keeping this area dry.  If this does not start to improve, I want him to see our wound clinic here on the [place] campus.    I think the swelling in his lower extremities is secondary to venous insufficiency.  I think the mainstay of her treatment will be to keep his legs up and to use compression stockings.  Given that he has a knot which I think is probably a fluid collection, I want him to get the above the knee level compression stockings.  He had an ultrasound done earlier this week locally that did not show any DVT.    His heart failure seems to be stable.  His CardioMEMS readings have been stable.  He is on goal-directed medical therapy.  I do not think that this is the explanation of his lower extremity swelling.  I continued him on his current regimen.     RTC: 12/21 as scheduled      Patient Instructions     Future Appointments Date Time Provider Department Center   02/10/2019  4:40 PM Candice Lunney, [name], MD [facility]EN[facility] IM   02/27/2019 12:00 AM [facility]OTE MONITORING [facility]CREMOTEHRM CVM Procedur   03/03/2019  4:00 PM Vena Austria, MD [facility]UCL CVM Exam   03/14/2019 11:00 AM Lady Saucier, MD QVAPULM IM   03/31/2019 12:00 AM [facility] REMOTE MONITORING MACREMOTEHRM CVM Procedur   05/01/2019 12:00 AM [facility] REMOTE MONITORING MACREMOTEHRM CVM Procedur   05/16/2019  2:00 PM [place] ECHO 2 [facility] CVM Procedur   05/16/2019  3:30 PM Vanetta Shawl I, MD MACHFC CVM Exam   06/02/2019 12:00 AM [facility] REMOTE MONITORING MACREMOTEHRM CVM Procedur   07/03/2019 12:00 AM [facility] REMOTE MONITORING MACREMOTEHRM CVM Procedur   08/04/2019 12:00 AM [facility] REMOTE MONITORING MACREMOTEHRM CVM Procedur   09/04/2019 12:00 AM MAC REMOTE MONITORING MACREMOTEHRM CVM Procedur   10/06/2019 12:00 AM MAC REMOTE MONITORING MACREMOTEHRM CVM Procedur   11/06/2019 12:00 AM [facility] REMOTE MONITORING MACREMOTEHRM CVM Procedur   12/08/2019 12:00 AM MAC REMOTE MONITORING MACREMOTEHRM CVM Procedur   01/08/2020 12:00 AM [facility] REMOTE MONITORING MACREMOTEHRM CVM Procedur   02/09/2020 12:00 AM MAC REMOTE MONITORING MACREMOTEHRM CVM Procedur   03/11/2020 12:00 AM [facility] REMOTE MONITORING MACREMOTEHRM CVM Procedur   04/12/2020 12:00 AM [facility] REMOTE MONITORING MACREMOTEHRM CVM Procedur   05/13/2020 12:00 AM [facility] REMOTE MONITORING MACREMOTEHRM CVM Procedur   06/14/2020 12:00 AM [facility] REMOTE MONITORING MACREMOTEHRM CVM Procedur  If you are on Mychart, you will now be able to read your clinic note from me.  My hope is that this will improve your engagement and insight into your health care and improve the accuracy of your medical record.  If you find inaccuracies, I apologize.  Please bring any concerns or inaccuracies to your next appointment.  Please remember that medical language and documentation is very different from how you and I speak and our dictation software is not perfect.    General Instructions: ? How to reach me:   Please send a MyChart message to the General Medicine clinic or call Wynona Canes at [phone].    ? How to get a medication refill:  Please use the MyChart Refill request or contact your pharmacy directly to request medication refills. Please allow 48 hours.     ? How to receive your test results:  If you have signed up for MyChart, you will receive your test results and messages from me this way.  Otherwise, you will get a phone call or letter.   If you are expecting results and have not heard from my office within 2 weeks of your testing, please send a MyChart message or call my office.     ? Scheduling:  Our Scheduling phone number is [phone].  Same Day appointments are usually available. Please ask for an annual or yearly physical appointment if it has been over 1 year since our last appointment.  ? Appointment Reminders on your cell phone: Make sure we have your cell phone number, and Text [facility] to [phone].  ? Support groups for many chronic illnesses are available through [facility]: [email or web address] or [phone].             No follow-ups on file.

## 2019-02-08 DIAGNOSIS — L89309 Pressure ulcer of unspecified buttock, unspecified stage: Principal | ICD-10-CM

## 2019-02-10 ENCOUNTER — Encounter: Admit: 2019-02-10 | Discharge: 2019-02-10 | Payer: MEDICARE

## 2019-02-10 ENCOUNTER — Ambulatory Visit: Admit: 2019-02-10 | Discharge: 2019-02-11 | Payer: MEDICARE

## 2019-02-10 DIAGNOSIS — Z8679 Personal history of other diseases of the circulatory system: Secondary | ICD-10-CM

## 2019-02-10 DIAGNOSIS — G4733 Obstructive sleep apnea (adult) (pediatric): Secondary | ICD-10-CM

## 2019-02-10 DIAGNOSIS — I714 Abdominal aortic aneurysm, without rupture: Secondary | ICD-10-CM

## 2019-02-10 DIAGNOSIS — R05 Cough: Secondary | ICD-10-CM

## 2019-02-10 DIAGNOSIS — M954 Acquired deformity of chest and rib: Secondary | ICD-10-CM

## 2019-02-10 DIAGNOSIS — N183 CKD (chronic kidney disease) stage 3, GFR 30-59 ml/min: Secondary | ICD-10-CM

## 2019-02-10 DIAGNOSIS — J302 Other seasonal allergic rhinitis: Secondary | ICD-10-CM

## 2019-02-10 DIAGNOSIS — I10 Essential (primary) hypertension: Secondary | ICD-10-CM

## 2019-02-10 DIAGNOSIS — I5042 Chronic combined systolic (congestive) and diastolic (congestive) heart failure: Secondary | ICD-10-CM

## 2019-02-10 DIAGNOSIS — J439 Emphysema, unspecified: Secondary | ICD-10-CM

## 2019-02-10 DIAGNOSIS — M961 Postlaminectomy syndrome, not elsewhere classified: Secondary | ICD-10-CM

## 2019-02-10 DIAGNOSIS — K219 Gastro-esophageal reflux disease without esophagitis: Secondary | ICD-10-CM

## 2019-02-10 DIAGNOSIS — J45909 Unspecified asthma, uncomplicated: Secondary | ICD-10-CM

## 2019-02-10 DIAGNOSIS — J189 Pneumonia, unspecified organism: Secondary | ICD-10-CM

## 2019-02-10 DIAGNOSIS — R0600 Dyspnea, unspecified: Secondary | ICD-10-CM

## 2019-02-10 DIAGNOSIS — L89309 Pressure ulcer of unspecified buttock, unspecified stage: Secondary | ICD-10-CM

## 2019-02-10 DIAGNOSIS — Z9981 Dependence on supplemental oxygen: Secondary | ICD-10-CM

## 2019-02-10 DIAGNOSIS — J479 Bronchiectasis, uncomplicated: Secondary | ICD-10-CM

## 2019-02-10 DIAGNOSIS — J449 Chronic obstructive pulmonary disease, unspecified: Secondary | ICD-10-CM

## 2019-02-10 DIAGNOSIS — I509 Heart failure, unspecified: Secondary | ICD-10-CM

## 2019-02-10 DIAGNOSIS — I429 Cardiomyopathy, unspecified: Secondary | ICD-10-CM

## 2019-02-10 DIAGNOSIS — I2510 Atherosclerotic heart disease of native coronary artery without angina pectoris: Secondary | ICD-10-CM

## 2019-02-10 DIAGNOSIS — N401 Enlarged prostate with lower urinary tract symptoms: Secondary | ICD-10-CM

## 2019-02-10 DIAGNOSIS — R609 Edema, unspecified: Secondary | ICD-10-CM

## 2019-02-10 DIAGNOSIS — Z95828 Presence of other vascular implants and grafts: Secondary | ICD-10-CM

## 2019-02-10 DIAGNOSIS — Z8614 Personal history of Methicillin resistant Staphylococcus aureus infection: Secondary | ICD-10-CM

## 2019-02-10 DIAGNOSIS — E782 Mixed hyperlipidemia: Secondary | ICD-10-CM

## 2019-02-10 DIAGNOSIS — L03116 Cellulitis of left lower limb: Secondary | ICD-10-CM

## 2019-02-10 NOTE — Progress Notes
Did patient read financial policy, consent to treat, and notice of privacy practices? Yes    Does the patient give verbal consent to each policy? Yes    Does the patient have any vitals to report? No    N/A Vitals charted in O2? N/A    Is the patient in pain? 5/10    Screening questions completed? Yes    Is the patient able to acces the Mychart message with the start visit link? Yes    Is patient in "virtual waiting room" Yes    Shunte Senseney, RN

## 2019-02-10 NOTE — Progress Notes
History of Present Illness  Ruben Wood. is a 78 y.o. male with multiple medical problems including CAD, Systolic and Diastolic HF, HLD, OSA on CPAP, morbid obesity, chronic pain, and COPD who presents today for acute visit.     Obtained patient's verbal consent to treat them and their agreement to [facility] financial policy and NPP via this telehealth visit during the [facility]    Ed reports that he is doing well today.  He did get the above-the-knee compression stockings.  This helped with some of the swelling on the lower extremity.  He has had some increased swelling in the upper leg now.  Does have some mild increased shortness of breath from his baseline as well as a mild increase in cough.  He denies any change in orthopnea or PND however.  He does have known congestive heart failure and he feels like he is having some slight decompensation.  His CardioMEMS numbers, however, have been stable.  As far as the cough goes, he denies any fevers or chills or increased sputum production.  He denies any rhinorrhea.    The wound on his buttock region is improving.  The cream that I prescribed at last visit seems to be helping.  He is trying to work on offloading that is much as possible.  He denies any surrounding erythema or fevers or chills or drainage.  He has home health coming out to help with wound care.      Review of Systems   Constitutional: Negative for chills and fever.   Respiratory: Positive for shortness of breath. Negative for chest tightness.    Cardiovascular: Positive for leg swelling.   Skin: Positive for wound. Negative for color change.       Objective:         ? acetaminophen (TYLENOL) 325 mg tablet Take two tablets by mouth every 6 hours as needed for Pain.   ? albuterol 0.083% (PROVENTIL) 2.5 mg /3 mL (0.083 %) nebulizer solution Inhale 3 mL solution by nebulizer as directed every 6 hours as needed for Wheezing or Shortness of Breath. Indications: asthma ? albuterol sulfate (PROAIR HFA) 90 mcg/actuation aerosol inhaler Inhale two puffs by mouth into the lungs every 4 hours as needed for Wheezing or Shortness of Breath.   ? allopurinoL (ZYLOPRIM) 300 mg tablet Take one tablet by mouth daily. Take with food.   ? AMITR/GABAPEN/EMU OIL 05/24/08% CREAM (COMPOUND) Apply 5 grams (10 pumps) topically to affected area three times daily.   ? Ammonium Lactate-Emu Oil (EMU-LAC) 10 % crea Apply  topically to affected area. Apply to feet    ? ascorbic acid (VITAMIN C) 500 mg tablet Take one tablet by mouth daily.   ? aspirin EC 81 mg tablet Take one tablet by mouth daily. Take with food.   ? atorvastatin (LIPITOR) 40 mg tablet TAKE (1) TABLET BY MOUTH ONCE DAILY   ? bacitracin 500 unit/g topical ointment Apply to affected area TID   ? benzonatate (TESSALON PERLES) 100 mg capsule Take two capsules by mouth every 8 hours as needed for Cough.   ? budesonide-formoterol (SYMBICORT HFA) 160-4.5 mcg/actuation inhalation Inhale two puffs by mouth into the lungs twice daily. Rinse mouth after use.   ? bumetanide (BUMEX) 2 mg tablet Take one tablet by mouth daily. Take extra dose for swelling   ? cholecalciferol (VITAMIN D-3) 1,000 units tablet Take 1,000 Units by mouth daily.   ? docusate (COLACE) 100 mg capsule Take 100  mg by mouth daily as needed for Constipation.   ? doxycycline (MONODOX) 100 mg capsule Take one capsule by mouth daily.   ? duloxetine DR (CYMBALTA) 60 mg capsule Take one capsule by mouth daily.   ? empagliflozin (JARDIANCE) 10 mg tablet Take one tablet by mouth daily.   ? finasteride (PROSCAR) 5 mg tablet Take one tablet by mouth daily.   ? fish oil- omega 3-DHA/EPA 300/1,000 mg capsule Take 1 capsule by mouth daily.   ? gabapentin (NEURONTIN) 400 mg capsule Take 1 capsule by mouth three times daily as needed.   ? ipratropium (ATROVENT) 0.03 % nasal spray Apply two sprays to each nostril as directed every 12 hours. ? montelukast (SINGULAIR) 10 mg tablet Take one tablet by mouth at bedtime daily.   ? nitroglycerin (NITROSTAT) 0.4 mg tablet Place 0.4 mg under tongue every 5 minutes as needed for Chest Pain. Max of 3 tablets, call 911.   ? nystatin (NYSTOP) 100,000 unit/g topical powder Apply  topically to affected area four times daily.   ? pantoprazole DR (PROTONIX) 40 mg tablet TAKE (1) TABLET BY MOUTH TWO TIMES A DAY.   ? polyethylene glycol 3350 (MIRALAX) 17 g packet Take one packet by mouth as Needed.   ? sodium chloride 3 % nebulizer solution Inhale 4 mL by mouth into the lungs twice daily. Use with [place].  Indications: asthma, chronic cough, bronchiectasis   ? spironolactone (ALDACTONE) 25 mg tablet Take one tablet by mouth twice daily. Take with food.   ? tamsulosin (FLOMAX) 0.4 mg capsule Take one capsule by mouth daily.     There were no vitals filed for this visit.    There is no height or weight on file to calculate BMI.     Wt Readings from Last 5 Encounters:   01/31/19 132.9 kg (293 lb)   01/15/19 131.5 kg (290 lb)   01/03/19 129.3 kg (285 lb)   12/31/18 129.3 kg (285 lb)   12/21/18 127.5 kg (281 lb)       Physical Exam  Constitutional:       Comments: Frail appearing   HENT:      Head: Normocephalic and atraumatic.   Pulmonary:      Effort: Pulmonary effort is normal.      Comments: Wearing oxygen  Musculoskeletal:      Right lower leg: Edema present.      Left lower leg: Edema present.   Neurological:      General: No focal deficit present.      Mental Status: He is alert.   Psychiatric:         Mood and Affect: Mood normal.       Labs Reviewed:   Lab Results   Component Value Date/Time    HGBA1C 6.3 (H) 12/05/2018 01:57 AM    HGBA1C 6.7 (H) 08/12/2018    HGBA1C 6.8 (H) 04/03/2017 04:39 AM    HGBPOC 11.2 (L) 10/25/2012 10:58 AM    HGBPOC 12.9 (L) 10/25/2012 08:55 AM    TSH 4.37 12/20/2018 08:09 PM    FREET4R 0.99 12/15/2014 09:12 AM    CHOL 106 12/05/2018 01:57 AM    TRIG 108 12/05/2018 01:57 AM HDL 31 (L) 12/05/2018 01:57 AM    LDL 60 12/05/2018 01:57 AM    NA 139 02/06/2019    K 3.9 02/06/2019    CL 100 02/06/2019    CO2 28.3 02/06/2019    GAP 9 12/31/2018 12:04 PM    BUN 25.0 (  H) 02/06/2019    CR 1.5 (H) 02/06/2019    GLU 115 (H) 02/06/2019    CA 9.0 02/06/2019    PO4 3.2 12/21/2018 10:34 PM    ALBUMIN 2.9 (L) 12/26/2018 05:19 AM    TOTPROT 5.7 (L) 12/26/2018 05:19 AM    ALKPHOS 69 12/26/2018 05:19 AM    AST 22 12/26/2018 05:19 AM    ALT 38 12/26/2018 05:19 AM    TOTBILI 0.3 12/26/2018 05:19 AM    GFR 48 02/06/2019    GFRAA >60 12/31/2018 12:04 PM    PSA 2.96 11/25/2018         Assessment and Plan:    1. Chronic combined systolic and diastolic congestive heart failure, NYHA class 2 (HCC)    2. Mixed restrictive and obstructive lung disease (HCC)    3. Pressure injury of skin of buttock, unspecified injury stage, unspecified laterality      I think it Ed might be having some mild decompensation from heart failure standpoint.  I did advise him to take an extra dose of Lasix tomorrow and on the following day to see if we can get a little bit more fluid pulled off on his legs.  I want him continue to elevate his legs as much as possible and I want him to continue to wear the above-the-knee compression stockings.    He is having mild increased cough and shortness of breath.  He does have known restrictive disease as well as obstructive disease of his lungs and he does have frequent exacerbations of these.  At this age, I do not think we need to give him prednisone or an antibiotic, but we will need to watch this very closely.  He does have frequent exacerbations.    We will continue with the wound care for the pressure ulcer.  This does seem to be improving overall.  We will continue to try to keep the area dry as possible and offload it is much as he can.     RTC: 11:20am on 02/19/2019.       There are no Patient Instructions on file for this visit.    Return in 9 days (on 02/19/2019) for Telehealth Appt.

## 2019-02-13 ENCOUNTER — Encounter: Admit: 2019-02-13 | Discharge: 2019-02-13 | Payer: MEDICARE

## 2019-02-17 ENCOUNTER — Encounter: Admit: 2019-02-17 | Discharge: 2019-02-17 | Payer: MEDICARE

## 2019-02-17 DIAGNOSIS — N401 Enlarged prostate with lower urinary tract symptoms: Secondary | ICD-10-CM

## 2019-02-18 ENCOUNTER — Encounter: Admit: 2019-02-18 | Discharge: 2019-02-18 | Payer: MEDICARE

## 2019-02-18 DIAGNOSIS — I5042 Chronic combined systolic (congestive) and diastolic (congestive) heart failure: Secondary | ICD-10-CM

## 2019-02-18 MED ORDER — FINASTERIDE 5 MG PO TAB
ORAL_TABLET | Freq: Every day | ORAL | 1 refills | 90.00000 days | Status: DC
Start: 2019-02-18 — End: 2019-07-28

## 2019-02-19 ENCOUNTER — Encounter: Admit: 2019-02-19 | Discharge: 2019-02-19 | Payer: MEDICARE

## 2019-02-19 ENCOUNTER — Ambulatory Visit: Admit: 2019-02-19 | Discharge: 2019-02-20 | Payer: MEDICARE

## 2019-02-19 DIAGNOSIS — J189 Pneumonia, unspecified organism: Secondary | ICD-10-CM

## 2019-02-19 DIAGNOSIS — R05 Cough: Secondary | ICD-10-CM

## 2019-02-19 DIAGNOSIS — L03116 Cellulitis of left lower limb: Secondary | ICD-10-CM

## 2019-02-19 DIAGNOSIS — J449 Chronic obstructive pulmonary disease, unspecified: Secondary | ICD-10-CM

## 2019-02-19 DIAGNOSIS — I890 Lymphedema, not elsewhere classified: Secondary | ICD-10-CM

## 2019-02-19 DIAGNOSIS — R609 Edema, unspecified: Secondary | ICD-10-CM

## 2019-02-19 DIAGNOSIS — I429 Cardiomyopathy, unspecified: Secondary | ICD-10-CM

## 2019-02-19 DIAGNOSIS — Z8614 Personal history of Methicillin resistant Staphylococcus aureus infection: Secondary | ICD-10-CM

## 2019-02-19 DIAGNOSIS — I5042 Chronic combined systolic (congestive) and diastolic (congestive) heart failure: Secondary | ICD-10-CM

## 2019-02-19 DIAGNOSIS — Z9981 Dependence on supplemental oxygen: Secondary | ICD-10-CM

## 2019-02-19 DIAGNOSIS — I10 Essential (primary) hypertension: Secondary | ICD-10-CM

## 2019-02-19 DIAGNOSIS — N401 Enlarged prostate with lower urinary tract symptoms: Secondary | ICD-10-CM

## 2019-02-19 DIAGNOSIS — N183 CKD (chronic kidney disease) stage 3, GFR 30-59 ml/min: Secondary | ICD-10-CM

## 2019-02-19 DIAGNOSIS — M954 Acquired deformity of chest and rib: Secondary | ICD-10-CM

## 2019-02-19 DIAGNOSIS — J479 Bronchiectasis, uncomplicated: Secondary | ICD-10-CM

## 2019-02-19 DIAGNOSIS — J45909 Unspecified asthma, uncomplicated: Secondary | ICD-10-CM

## 2019-02-19 DIAGNOSIS — R0600 Dyspnea, unspecified: Secondary | ICD-10-CM

## 2019-02-19 DIAGNOSIS — I714 Abdominal aortic aneurysm, without rupture: Secondary | ICD-10-CM

## 2019-02-19 DIAGNOSIS — J302 Other seasonal allergic rhinitis: Secondary | ICD-10-CM

## 2019-02-19 DIAGNOSIS — Z95828 Presence of other vascular implants and grafts: Secondary | ICD-10-CM

## 2019-02-19 DIAGNOSIS — I2510 Atherosclerotic heart disease of native coronary artery without angina pectoris: Secondary | ICD-10-CM

## 2019-02-19 DIAGNOSIS — M961 Postlaminectomy syndrome, not elsewhere classified: Secondary | ICD-10-CM

## 2019-02-19 DIAGNOSIS — E782 Mixed hyperlipidemia: Secondary | ICD-10-CM

## 2019-02-19 DIAGNOSIS — I509 Heart failure, unspecified: Secondary | ICD-10-CM

## 2019-02-19 DIAGNOSIS — G4733 Obstructive sleep apnea (adult) (pediatric): Secondary | ICD-10-CM

## 2019-02-19 DIAGNOSIS — Z8679 Personal history of other diseases of the circulatory system: Secondary | ICD-10-CM

## 2019-02-19 DIAGNOSIS — K219 Gastro-esophageal reflux disease without esophagitis: Secondary | ICD-10-CM

## 2019-02-19 DIAGNOSIS — J9612 Chronic respiratory failure with hypercapnia: Secondary | ICD-10-CM

## 2019-02-19 NOTE — Progress Notes
History of Present Illness    Ruben Wood. is a 78 y.o. male with multiple medical problems including CAD, Systolic and Diastolic HF, HLD, OSA on CPAP, morbid obesity, chronic pain, and COPD who presents today for acute visit.     Obtained patient's verbal consent to treat them and their agreement to [facility] financial policy and NPP via this telehealth visit during the [facility]    He has been having persistent troubles with swelling in his lower extremities.  This is a longstanding problem, but it has been worse lately.  We have tried compression stockings, but this has not helped much.  I did increase his diuretic for a few days last week, but this did not seem to improve his swelling at all.  He does have shortness of breath and cough.  This is slightly increased from his baseline.  He denies any fevers, but he has had increased O2 requirements lately.  He has had his oxygen dipped down into the upper 80s without oxygen, so has been wearing 2 L of oxygen more regularly.  He denies any fevers or chills.  He denies any change in his sputum.  His CardioMEMS readings have been stable.      Review of Systems   Constitutional: Negative for chills and fever.   Respiratory: Positive for shortness of breath. Negative for chest tightness.    Cardiovascular: Positive for leg swelling.   Skin: Positive for wound. Negative for color change.       Objective:         ? acetaminophen (TYLENOL) 325 mg tablet Take two tablets by mouth every 6 hours as needed for Pain.   ? albuterol 0.083% (PROVENTIL) 2.5 mg /3 mL (0.083 %) nebulizer solution Inhale 3 mL solution by nebulizer as directed every 6 hours as needed for Wheezing or Shortness of Breath. Indications: asthma   ? albuterol sulfate (PROAIR HFA) 90 mcg/actuation aerosol inhaler Inhale two puffs by mouth into the lungs every 4 hours as needed for Wheezing or Shortness of Breath. ? allopurinoL (ZYLOPRIM) 300 mg tablet Take one tablet by mouth daily. Take with food.   ? AMITR/GABAPEN/EMU OIL 05/24/08% CREAM (COMPOUND) Apply 5 grams (10 pumps) topically to affected area three times daily.   ? Ammonium Lactate-Emu Oil (EMU-LAC) 10 % crea Apply  topically to affected area. Apply to feet    ? ascorbic acid (VITAMIN C) 500 mg tablet Take one tablet by mouth daily.   ? aspirin EC 81 mg tablet Take one tablet by mouth daily. Take with food.   ? atorvastatin (LIPITOR) 40 mg tablet TAKE (1) TABLET BY MOUTH ONCE DAILY   ? bacitracin 500 unit/g topical ointment Apply to affected area TID   ? benzonatate (TESSALON PERLES) 100 mg capsule Take two capsules by mouth every 8 hours as needed for Cough.   ? budesonide-formoterol (SYMBICORT HFA) 160-4.5 mcg/actuation inhalation Inhale two puffs by mouth into the lungs twice daily. Rinse mouth after use.   ? bumetanide (BUMEX) 2 mg tablet Take one tablet by mouth daily. Take extra dose for swelling   ? cholecalciferol (VITAMIN D-3) 1,000 units tablet Take 1,000 Units by mouth daily.   ? docusate (COLACE) 100 mg capsule Take 100 mg by mouth daily as needed for Constipation.   ? doxycycline (MONODOX) 100 mg capsule Take one capsule by mouth daily.   ? duloxetine DR (CYMBALTA) 60 mg capsule Take one capsule by mouth daily.   ? empagliflozin (JARDIANCE)  10 mg tablet Take one tablet by mouth daily.   ? finasteride (PROSCAR) 5 mg tablet TAKE (1) TABLET BY MOUTH ONCE DAILY   ? fish oil- omega 3-DHA/EPA 300/1,000 mg capsule Take 1 capsule by mouth daily.   ? gabapentin (NEURONTIN) 400 mg capsule Take 1 capsule by mouth three times daily as needed.   ? ipratropium (ATROVENT) 0.03 % nasal spray Apply two sprays to each nostril as directed every 12 hours.   ? montelukast (SINGULAIR) 10 mg tablet Take one tablet by mouth at bedtime daily. ? nitroglycerin (NITROSTAT) 0.4 mg tablet Place 0.4 mg under tongue every 5 minutes as needed for Chest Pain. Max of 3 tablets, call 911.   ? nystatin (NYSTOP) 100,000 unit/g topical powder Apply  topically to affected area four times daily.   ? pantoprazole DR (PROTONIX) 40 mg tablet TAKE (1) TABLET BY MOUTH TWO TIMES A DAY.   ? polyethylene glycol 3350 (MIRALAX) 17 g packet Take one packet by mouth as Needed.   ? sodium chloride 3 % nebulizer solution Inhale 4 mL by mouth into the lungs twice daily. Use with [place].  Indications: asthma, chronic cough, bronchiectasis   ? spironolactone (ALDACTONE) 25 mg tablet Take one tablet by mouth twice daily. Take with food.   ? tamsulosin (FLOMAX) 0.4 mg capsule Take one capsule by mouth daily.     There were no vitals filed for this visit.    There is no height or weight on file to calculate BMI.     Wt Readings from Last 5 Encounters:   01/31/19 132.9 kg (293 lb)   01/15/19 131.5 kg (290 lb)   01/03/19 129.3 kg (285 lb)   12/31/18 129.3 kg (285 lb)   12/21/18 127.5 kg (281 lb)       Physical Exam  Constitutional:       Comments: Frail appearing   HENT:      Head: Normocephalic and atraumatic.   Pulmonary:      Effort: Pulmonary effort is normal.      Comments: Wearing oxygen  Musculoskeletal:      Right lower leg: Edema present.      Left lower leg: Edema present.   Neurological:      General: No focal deficit present.      Mental Status: He is alert.   Psychiatric:         Mood and Affect: Mood normal.       Labs Reviewed:   Lab Results   Component Value Date/Time    HGBA1C 6.3 (H) 12/05/2018 01:57 AM    HGBA1C 6.7 (H) 08/12/2018    HGBA1C 6.8 (H) 04/03/2017 04:39 AM    HGBPOC 11.2 (L) 10/25/2012 10:58 AM    HGBPOC 12.9 (L) 10/25/2012 08:55 AM    TSH 4.37 12/20/2018 08:09 PM    FREET4R 0.99 12/15/2014 09:12 AM    CHOL 106 12/05/2018 01:57 AM    TRIG 108 12/05/2018 01:57 AM    HDL 31 (L) 12/05/2018 01:57 AM    LDL 60 12/05/2018 01:57 AM    NA 140 02/17/2019 K 3.8 02/17/2019    CL 99 02/17/2019    CO2 28.9 02/17/2019    GAP 9 12/31/2018 12:04 PM    BUN 18 02/17/2019    CR 1.2 02/17/2019    GLU 110 02/17/2019    CA 8.7 02/17/2019    PO4 3.2 12/21/2018 10:34 PM    ALBUMIN 2.9 (L) 12/26/2018 05:19 AM    TOTPROT 5.7 (  L) 12/26/2018 05:19 AM    ALKPHOS 69 12/26/2018 05:19 AM    AST 22 12/26/2018 05:19 AM    ALT 38 12/26/2018 05:19 AM    TOTBILI 0.3 12/26/2018 05:19 AM    GFR 62 02/17/2019    GFRAA >60 12/31/2018 12:04 PM    PSA 2.96 11/25/2018         Assessment and Plan:    1. Chronic combined systolic and diastolic congestive heart failure, NYHA class 2 (HCC)    2. Bronchiectasis without complication (HCC)    3. Chronic respiratory failure with hypercapnia (HCC)    4. Lymphedema      He is here today with a chief concern of lower extremity swelling.  I think that the explanation for this is lymphedema.  I have lower suspicion for decompensated heart failure.  I will correlate with his heart failure team to see what his recent CardioMEMS readings have been.  He did not get any clinical benefit with increasing his diuretics for a few days.  I talked to them about doing Ace bandage wraps.  He does have home health nursing he will come out tomorrow and they can help with this.  I want him to do Ace bandage wrap from the toes all the way up as high as they can on the leg to help try to mobilize this fluid.  He will need to leave this on for a few days.    As for the cough and shortness of breath, he does have a bronchiectasis with frequent flares.  He is on doxycycline.  He occasionally needs prednisone, but at this time I will hold off as he does feel like his symptoms are little better than they were a couple of days ago.  He will keep me in the loop, but in the meantime we will continue with the current plan with his inhalers as well as the vast and incentive spirometry.    RTC: 4 weeks for follow up      There are no Patient Instructions on file for this visit. Return in 29 days (on 03/20/2019).

## 2019-02-19 NOTE — Progress Notes
02/19/2019 @ 11:08 am LVM to get ready for telehealth visit today @ 11:20 am.  Myrtie Hawk, RN   Did patient read financial policy, consent to treat, and notice of privacy practices? Yes    Does the patient give verbal consent to each policy? Yes    Does the patient have any vitals to report? No    N/A Vitals charted in O2? N/A    Is the patient in pain? 6/10    Screening questions completed? Yes    Is the patient able to acces the Mychart message with the start visit link? Yes    Is patient in "virtual waiting room" Yes    Buttock wound pain level 6/10 staying the same, bilateral leg and feet swelling.    Myrtie Hawk, RN

## 2019-02-20 ENCOUNTER — Encounter: Admit: 2019-02-20 | Discharge: 2019-02-20 | Payer: MEDICARE

## 2019-02-20 DIAGNOSIS — I890 Lymphedema, not elsewhere classified: Secondary | ICD-10-CM

## 2019-02-20 MED ORDER — MISCELLANEOUS MEDICAL SUPPLY MISC MISC
0 refills | 1.00000 days | Status: AC
Start: 2019-02-20 — End: ?

## 2019-02-20 MED ORDER — MISCELLANEOUS MEDICAL SUPPLY MISC MISC
0 refills | 1.00000 days | Status: DC
Start: 2019-02-20 — End: 2019-02-20

## 2019-02-20 NOTE — Telephone Encounter
Rx under misc supply. Thanks

## 2019-02-20 NOTE — Telephone Encounter
Order printed and faxed to [facility] @ [phone].  Confirmation received of transmission.    I called patients wife LVM letting her know order was faxed.  Provided my contact information advising she follow up with me with any further questions or concerns.  I let her know I would be available until 3:00 pm today then we were closed for the holiday returning on Monday.  Also provided on call provider contact number [phone]    Etta Grandchild, RN

## 2019-02-20 NOTE — Telephone Encounter
Patients wife called stating Home Health needs a faxed order for them to wrap his legs/ feet and reason they need to be wrapped faxed to [phone].    Routing to Dr. Crecencio Mc for written order for me to fax    Etta Grandchild, RN

## 2019-02-24 ENCOUNTER — Encounter: Admit: 2019-02-24 | Discharge: 2019-02-24 | Payer: MEDICARE

## 2019-02-24 NOTE — Telephone Encounter
Spoke with [employer]. She was not at home but will discuss with patient and follow up tomorrow.   Virgilio Frees, RN

## 2019-02-24 NOTE — Telephone Encounter
He is on a decent dose of duloxetine. We have been seeing increased depression/anxiety post covid, so this may also be contributing.  Would he be interested in doing any talk therapy?  I would be happy to refer to Dr. Wanita Chamberlain.  I would prefer to avoid more medications if we can get away with it.

## 2019-02-24 NOTE — Telephone Encounter
Ruben Wood called stating that the patient is increasingly depressed. Home Health providers were out today and expressed their concern as well. Ruben Wood states that he is not eating and in inactive. States that he is currently on Duloxetine and is wondering if that could be increased or something else prescribed.  Routing to PCP to advise.  Virgilio Frees, RN

## 2019-02-27 ENCOUNTER — Encounter: Admit: 2019-02-27 | Discharge: 2019-02-27 | Payer: MEDICARE

## 2019-02-27 ENCOUNTER — Ambulatory Visit: Admit: 2019-02-27 | Discharge: 2019-02-27 | Payer: MEDICARE

## 2019-02-27 DIAGNOSIS — M545 Low back pain: Secondary | ICD-10-CM

## 2019-02-27 DIAGNOSIS — R5381 Other malaise: Secondary | ICD-10-CM

## 2019-02-27 DIAGNOSIS — I5042 Chronic combined systolic (congestive) and diastolic (congestive) heart failure: Secondary | ICD-10-CM

## 2019-02-27 NOTE — Telephone Encounter
Patient's wife called stating HH PT thought it may be helpful for patient to go back out to facility for PT.     Patient and wife state that this may be helpful for him and he would like to do this, but concerned as to the number of COVID cases in the community and the possibility of exposure.    They would like Dr. Lowella Bandy opinion on this.    Routing to Dr. Crecencio Mc to advise  Etta Grandchild, RN

## 2019-02-27 NOTE — Telephone Encounter
I think this would be okay, so long as everyone is doing the standard precautions: masks, washing hands, sanitizing, social distancing when they can.  I think it would be helpful for him to get out of the house.

## 2019-02-27 NOTE — Telephone Encounter
Requesting order for outpatient speech therapy as well to be faxed to [phone].    Routing to Dr. Crecencio Mc to approve/ sign Speech Therapy Orders.    Etta Grandchild, RN

## 2019-02-27 NOTE — Telephone Encounter
The PT order is in.

## 2019-02-27 NOTE — Telephone Encounter
They will start out patient Physical Therapy and Speech Therapy on Friday.    They need an order faxed to [phone]    Routing to Dr. Crecencio Mc to approve PT and ST orders  Etta Grandchild, RN

## 2019-03-03 ENCOUNTER — Encounter: Admit: 2019-03-03 | Discharge: 2019-03-03 | Payer: MEDICARE

## 2019-03-03 ENCOUNTER — Ambulatory Visit: Admit: 2019-03-03 | Discharge: 2019-03-03 | Payer: MEDICARE

## 2019-03-03 DIAGNOSIS — Z789 Other specified health status: Secondary | ICD-10-CM

## 2019-03-03 DIAGNOSIS — K219 Gastro-esophageal reflux disease without esophagitis: Secondary | ICD-10-CM

## 2019-03-03 DIAGNOSIS — N401 Enlarged prostate with lower urinary tract symptoms: Secondary | ICD-10-CM

## 2019-03-03 DIAGNOSIS — L03116 Cellulitis of left lower limb: Secondary | ICD-10-CM

## 2019-03-03 DIAGNOSIS — J45909 Unspecified asthma, uncomplicated: Secondary | ICD-10-CM

## 2019-03-03 DIAGNOSIS — Z959 Presence of cardiac and vascular implant and graft, unspecified: Secondary | ICD-10-CM

## 2019-03-03 DIAGNOSIS — I714 Abdominal aortic aneurysm, without rupture: Secondary | ICD-10-CM

## 2019-03-03 DIAGNOSIS — N1831 Stage 3a chronic kidney disease: Secondary | ICD-10-CM

## 2019-03-03 DIAGNOSIS — I5042 Chronic combined systolic (congestive) and diastolic (congestive) heart failure: Secondary | ICD-10-CM

## 2019-03-03 DIAGNOSIS — E782 Mixed hyperlipidemia: Secondary | ICD-10-CM

## 2019-03-03 DIAGNOSIS — I429 Cardiomyopathy, unspecified: Secondary | ICD-10-CM

## 2019-03-03 DIAGNOSIS — J439 Emphysema, unspecified: Secondary | ICD-10-CM

## 2019-03-03 DIAGNOSIS — Z95828 Presence of other vascular implants and grafts: Secondary | ICD-10-CM

## 2019-03-03 DIAGNOSIS — J449 Chronic obstructive pulmonary disease, unspecified: Secondary | ICD-10-CM

## 2019-03-03 DIAGNOSIS — D801 Nonfamilial hypogammaglobulinemia: Secondary | ICD-10-CM

## 2019-03-03 DIAGNOSIS — I5020 Unspecified systolic (congestive) heart failure: Secondary | ICD-10-CM

## 2019-03-03 DIAGNOSIS — I10 Essential (primary) hypertension: Secondary | ICD-10-CM

## 2019-03-03 DIAGNOSIS — I2510 Atherosclerotic heart disease of native coronary artery without angina pectoris: Secondary | ICD-10-CM

## 2019-03-03 DIAGNOSIS — Z8614 Personal history of Methicillin resistant Staphylococcus aureus infection: Secondary | ICD-10-CM

## 2019-03-03 DIAGNOSIS — Z8679 Personal history of other diseases of the circulatory system: Secondary | ICD-10-CM

## 2019-03-03 DIAGNOSIS — R0600 Dyspnea, unspecified: Secondary | ICD-10-CM

## 2019-03-03 DIAGNOSIS — M954 Acquired deformity of chest and rib: Secondary | ICD-10-CM

## 2019-03-03 DIAGNOSIS — R609 Edema, unspecified: Secondary | ICD-10-CM

## 2019-03-03 DIAGNOSIS — G4733 Obstructive sleep apnea (adult) (pediatric): Secondary | ICD-10-CM

## 2019-03-03 DIAGNOSIS — J302 Other seasonal allergic rhinitis: Secondary | ICD-10-CM

## 2019-03-03 DIAGNOSIS — I5022 Chronic systolic (congestive) heart failure: Secondary | ICD-10-CM

## 2019-03-03 DIAGNOSIS — I255 Ischemic cardiomyopathy: Secondary | ICD-10-CM

## 2019-03-03 DIAGNOSIS — N183 CKD (chronic kidney disease) stage 3, GFR 30-59 ml/min: Secondary | ICD-10-CM

## 2019-03-03 DIAGNOSIS — J189 Pneumonia, unspecified organism: Secondary | ICD-10-CM

## 2019-03-03 DIAGNOSIS — Z9981 Dependence on supplemental oxygen: Secondary | ICD-10-CM

## 2019-03-03 DIAGNOSIS — I509 Heart failure, unspecified: Secondary | ICD-10-CM

## 2019-03-03 DIAGNOSIS — R1310 Dysphagia, unspecified: Secondary | ICD-10-CM

## 2019-03-03 DIAGNOSIS — J479 Bronchiectasis, uncomplicated: Secondary | ICD-10-CM

## 2019-03-03 DIAGNOSIS — M961 Postlaminectomy syndrome, not elsewhere classified: Secondary | ICD-10-CM

## 2019-03-03 DIAGNOSIS — I5032 Chronic diastolic (congestive) heart failure: Secondary | ICD-10-CM

## 2019-03-03 DIAGNOSIS — E669 Obesity, unspecified: Secondary | ICD-10-CM

## 2019-03-03 DIAGNOSIS — Z95810 Presence of automatic (implantable) cardiac defibrillator: Secondary | ICD-10-CM

## 2019-03-03 DIAGNOSIS — R05 Cough: Secondary | ICD-10-CM

## 2019-03-03 DIAGNOSIS — I340 Nonrheumatic mitral (valve) insufficiency: Secondary | ICD-10-CM

## 2019-03-03 NOTE — Progress Notes
Date of Service: 03/03/2019    [name]. Ruben Wood is a 79 y.o. male.       HPI     Mr. Ruben Wood. was seen today in the Cardiovascular Medicine Clinic at [facility].    I had the very nice pleasure of seeing Mr. Ruben Wood today for cardiovascular follow-up.     Ed had COVID infection and required 2 separate recent hospitalization. The first was from 12/04/18 through 12/15/2018 with acute respiratory failure and hypoxia as a result of COVID-19 infection. ?He was followed by ID and completed convalescent plasma therapy, remdesivir, and 10 days of dexamethasone. ?He was discharged to SNF on 12/15/18. ?His midodrine was discontinued during this hospitalization. ?Unfortunately he required re-admission for recurrent dyspnea and hypoxic respiratory failure after developing gross volume overload at the SNF ?Notably he had missed several days of diuretic while in a inpatient SNF?the week prior. ?He was hospitalized from 12/15/18 through 12/26/18.  He was treated in the MICU with IV diuresis, ceftriaxone for suspected UTI, and vancomycin for left lower extremity cellulitis. 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 ?He did have lower extremity arterial evaluation during his stay, but his arterial duplex was negative for any high grade stenosis. ?He was also followed by the cardiology team in regard to his heart failure. ?He was discharged with Bumex 2 mg daily and spironolactone 25 mg twice daily.      Dictation on: 03/03/2019  8:47 PM by: Burnett Sheng [RFERRELL]          Vitals:    03/03/19 1528   BP: 118/60   BP Source: Arm, Left Upper   Patient Position: Sitting   Pulse: 87   SpO2: 93%   Weight: 126.1 kg (278 lb)   Height: 1.803 m (5' 11)   PainSc: Zero     Body mass index is 38.77 kg/m?[name]     Past Medical History  Patient Active Problem List    Diagnosis Date Noted ? Complex care coordination 05/04/2017     Priority: High     Class: Acute   ? Acute cystitis 12/21/2018   ? Pneumonia due to COVID-19 virus 12/05/2018   ? Chronic bronchitis with acute exacerbation (HCC) 12/05/2018   ? COVID-19 virus infection 12/05/2018   ? Heart failure with reduced ejection fraction (HCC) 12/05/2018   ? Cough 12/04/2018   ? 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 Type 2 diabetes mellitus with hyperglycemia, without long-term current use of insulin (HCC) 03/12/2018   ? Atrial fibrillation (HCC) 03/12/2018   ? Orthostatic hypotension 12/28/2017   ? Morbid obesity (HCC) 11/16/2017   ? Chronic respiratory failure with hypercapnia (HCC) 11/16/2017   ? Concussion with loss of consciousness of 30 minutes or less 11/16/2017   ? Acute pain due to trauma 11/15/2017   ? Episode of syncope 11/15/2017   ? Facial laceration 11/15/2017   ? Depression 11/15/2017   ? Closed traumatic dislocation of right shoulder 11/15/2017   ? Impaired mobility 11/15/2017   ? Trauma 11/14/2017   ? OAB (overactive bladder)      Urinary frequency, urgency, urge urinary incontinence (UUI), nocturia, nocturnal enuresis.  -- trial Mirabegron 25 mg --> improved, but persistent sx's.  -- trial Mirabegron 50 mg.     ? Urinary incontinence, urge      See OAB A&P note.     ?  Urinary urgency      See OAB A&P note.     ? Nocturia      See OAB A&P note.     ? Nocturnal enuresis    ? Constipation    ? Swelling of left hand 10/07/2017     Resolved, unclear etiology.     ? Chronic combined systolic and diastolic CHF (congestive heart failure) (HCC) 07/13/2017   ? Upper extremity pain, anterior, right 07/03/2017   ? Right hand pain 07/03/2017   ? Coronary artery disease due to lipid rich plaque 06/29/2017   ? Oropharyngeal dysphagia 05/10/2017   ? Bronchiectasis with acute lower respiratory infection (HCC) 05/10/2017   ? Acute on chronic respiratory failure with hypoxia (HCC) 05/02/2017 ? Acute on chronic systolic congestive heart failure (HCC) 05/02/2017   ? Acute on chronic systolic and diastolic heart failure, NYHA class 3 (HCC) 03/27/2017   ? Urinary frequency 03/08/2017   ? 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     ? 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  D/c'd Tamsulosin d/t lack of symptom improvement.     ? Enrolled in chronic care management 02/28/2017   ? Elevated prostate specific antigen (PSA)      PNBx (03/01/2017): (L) high-grade prostatic intraepithelial neoplasia (HG PIN), 1/6 cores; [name], PA-C.     ? Spondylolisthesis, lumbar region 12/13/2016   ? Acute respiratory failure with hypoxia (HCC) 11/07/2016   ? Lactic acid acidosis 11/07/2016   ? Hyperglycemia 11/07/2016   ? Lumbar post-laminectomy syndrome      L4-5     ? Iron deficiency 10/09/2016   ? Spinal stenosis of lumbosacral region 09/20/2016   ? Spondylolisthesis of lumbosacral region 09/20/2016   ? Lumbar radiculopathy 09/20/2016   ? Osteoarthritis of spine with radiculopathy, lumbosacral region 09/20/2016   ? Venous ulcer of left leg (HCC) 09/13/2016   ? 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 Lower extremity edema 07/13/2016   ? 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 He had a normal response to pneumococca vaccination; he had 17/23 positive serotypes. He has normal tetanus toxoid Ab and CH50.  His T&B cell panel revealed a mildly low B-cell count at 68 right after a time of acute illness.  He had positive diphtheria antibody. Likely multifactorial with significant comorbidities including frequent steroid use for bronchiectasis exacerbations, frequent infections, and possibly a nutritional component (low total protein).    Currently on doxycycline once daily for recurrent infections, which he feels has been tremendously helpful.     Plan:  - Continue doxycycline 100 mg daily  - Recommend getting the pneumovax immunization next time he is in clinic in person and will need repeat pneumococcal antibody levels 1 month afterwards once he has had resolution of his COVID issues.          ? Moderate persistent asthma with acute exacerbation 02/25/2016     He has had asthma since sometime in his 20-30s. He gets cough, chest tightness, wheezing, shortness of breath that is year round with worsening in the Spring and Fall.   Triggers: URI, hot/cold air. Additionally he has been diagnosed with bronchiectasis and emphysema; his PFTs  suggest both obstructive and restrictive disease and asthma is unlikely to be the sole cause of his dyspnea.    He has negative IgE immunocaps to aeroallergens which makes the possibility of allergic asthma highly unlikely. His IgE level was 17 and he had negative ANCAs, MPO and PR3.     Repeatedly being treated with steroids now for flares.  Also on hypertonic saline 3 times a day although not currently using them), DuoNebs, Symbicort, and Singulair as well as aggressive pulmonary clearance with vest and Aerobika.        ? 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 He is not having issues with urticaria.    - We recommend IgE immunocaps to aeroallergens.      ? Chronic Rhinoconjunctivitis 02/25/2016     Perennial, since childhood.  Nasal sprays are not helpful (Flonase, Nasacort) and patient had incorrect technique with sprays with resultant nosebleeds.  AIT several times in his life, only the first round was helpful.  02/2016 - CT sinuses normal.  02/2016 - Aeroallergen IgE negative. Currently on Zyrtec 10mg  daily and Singulair 10mg  daily off flunisolide and Atrovent.   Also has significant drip when he eats, concerning for gustatory rhinitis.      ? Recurrent infections 02/25/2016     Recurrent sinopulmonary infections requiring antibiotics 5-10 times per year.   Cellulitis and non-healing wounds.   Many underlying illnesses predisposing him to these infections in addition to intermittent hypogammaglobulinemia associated with active infections and systemic steroids.      ? Postoperative visit 02/23/2016   ? S/P left pulmonary artery pressure sensor implant placement (CardioMEMs)  02/02/2016     * Patient has CardioMEMs (Pulmonary Artery Pressure Sensor)* Please call Matthew Folks, CardioMEMs Program Coordinator [phone] or page Heart Failure Rounding Team if patient is admitted or presents to ED*  03/30/15 implant     ? Ischemic cardiomyopathy 01/12/2016   ? Other male erectile dysfunction 01/12/2016   ? Wound of skin 01/12/2016   ? 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 Varicose veins of left lower extremity with pain 01/05/2016   ? ICD (implantable cardioverter-defibrillator), biventricular, in situ 12/07/2015   ? Hypercholesterolemia 11/25/2015   ? 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 Bronchiectasis without complication (HCC) 06/18/2015     Non-sputum producer.  Currently on Symbicort.  - Dr. [name] was able to get him a vest to wear at home for secretions and this has helped immensely with his secretions and breathing.      ? Abnormal cortisol level 12/25/2014   ? Venous stasis dermatitis of both lower extremities 11/04/2014 ? Sacroiliac dysfunction 07/28/2014   ? Osteoarthritis of spine with radiculopathy, lumbar region 07/28/2014   ? Chronic combined systolic and diastolic congestive heart failure, NYHA class 2 (HCC) 07/19/2014     11/04/14: EF 20%, severely dilated LV with grade 1 diastolic dysfunction.  11/12/2014 In clinic today, patient appears fairly well compensated.  He does have some LEE, but no significant rales (just some at bases), no JVD sitting upright, and no sx to suggest decompensation.  We will  continue metoprolol, spironolactone, and bumex at current doses.    11/17/2014 Edema much improved in lower ext, Cr trending down on last labs.  Will recheck labs today.  Advised to weigh himself daily.  Rechecking BMP today to ensure not overdoing diuretics.     On metoprolol, spironolactone.  Will need to explore allergy to ARB more as patient may benefit from ACEI.      03/30/15 CardioMEMS implant by Dr. Chales Abrahams  1. Normal cardiac output and cardiac index.  2. Normal pulmonary pressures and normal pulmonary capillary wedge pressure.  3. Successful insertion of CardioMEMS pulmonary artery pressure sensor     ? 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      ? CKD (chronic kidney disease) 08/26/2013   ? Mixed dyslipidemia 08/26/2013     Hepatic Function    Lab Results   Component Value Date/Time    ALBUMIN 4.0 11/04/2014 12:26 AM    TOTAL PROTEIN 6.9 11/04/2014 12:26 AM    ALK PHOSPHATASE 61 11/04/2014 12:26 AM    Lab Results   Component Value Date/Time    AST (SGOT) 41* 11/04/2014 12:26 AM    ALT (SGPT) 19 11/04/2014 12:26 AM TOTAL BILIRUBIN 1.4* 11/04/2014 12:26 AM        Lab Results   Component Value Date    CHOL 148 12/15/2013    TRIG 122 12/15/2013    HDL 51 12/15/2013    LDL 88 12/15/2013    VLDL 24 12/15/2013    NONHDLCHOL 97 12/15/2013       BP Readings from Last 3 Encounters:   11/12/14 129/80   11/09/14 111/44   11/04/14 146/65     Plan:   79 y.o. with known CAD with CTO of RCA and obtuse marginal branch with high grade stenosis (90%) in mid left circ.    Advised heart healthy diet and daily exercise.    Continue high intensity statin, atorvastatin       ? AAA (abdominal aortic aneurysm) (HCC) 10/15/2012     Duplex done at OSH in 2007 and 2008 ~ 4.1 cm    Duplex 10/15/12-AAA 7.3 cm AP x 7.3 cm       ? History of MRSA infection 10/14/2012   ? Chronic cough 08/29/2012   ? CAD (coronary artery disease), native coronary artery 08/15/2012     07/11/2002- Cath @ [facility] showed Severe double vessel disease(OMB of circumflex and distal circ)  inferior-basilar dyskinesis.                  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    09/12/12   Cath - [facility]:  CTO of the right coronary artery, CTO of the obtuse marginal branch and high-grade stenosis of   90% in the mid left circumflex artery No significant stenosis in the left anterior descending artery or left main vessel     Failed attempt in opening 2nd obtuse marginal CTO as  described above.    10/14/12: Unsuccessful attempt to open CTO of OMB and mid-CFX by Dr. Mackey Birchwood      04/02/17: Cath by Dr. Steward Ros:   4. Chronic total occlusion of the circumflex.  5. Chronic total occlusion of the right coronary artery, status post percutaneous coronary intervention with drug-eluting stent times 2.  6. Small distal right coronary artery perforation without echo or hemodynamic evidence of compromise. 06/29/17-cardiac catheterization by Dr. Steward Ros. One DES placed to chronic total occlusion of the circumflex artery. The RCA stent was patent.     ? OSA on CPAP 08/13/2012     CPAP at  DME: [facility]    Split night:  08/14/2017  AHI 43.2  REM n/a  Time below 88 %: 98 mins  Lowest sat: 83%     CPAP 10 cm h20 effective      ? COPD (chronic obstructive pulmonary disease) (HCC) 08/13/2012     04/28/13: PFTs with more restrictive pattern with FEV1 50%, FEV1/FVC 89%, DLCO 65%.    12/27/13: PFTs with FEV1 82%, FEV1/FVC 113%, and DLCO 77%     ? GERD (gastroesophageal reflux disease) 08/13/2012     -PPI  -follows with GI     ? 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  Discussed the importance of diet.  Exercise as tolerated.        ? Essential hypertension 08/13/2012   ? Chest wall deformity 07/09/2012     Chest wall reconstruction on 07/09/12  Lung herniation- bio bridge       I reviewed and confirmed this patient's problem list, active medications, allergies, and past medical, social, family & tobacco histories.       Review of Systems   Constitution: Positive for decreased appetite.   HENT: Negative.    Eyes: Negative.    Cardiovascular: Positive for dyspnea on exertion and leg swelling.   Respiratory: Positive for shortness of breath.    Endocrine: Negative.    Hematologic/Lymphatic: Negative.    Skin: Negative.    Musculoskeletal: Negative.    Gastrointestinal: Negative.    Genitourinary: Negative.    Neurological: Positive for loss of balance.   Psychiatric/Behavioral: Negative.    Allergic/Immunologic: Negative.        Physical Exam  General Appearance: morbidly obese, no acute distress  Skin: warm & intact  HEENT: EOMI, mucous membranes moist, oropharynx is clear Neck Veins: neck veins are flat & not distended  Carotid Arteries: no bruits  Chest Inspection: device in right deltopectoral groove  Auscultation/Percussion: lungs clear to auscultation, no rales, rhonchi, or wheezing  Cardiac Rhythm: regular rhythm & normal rate  Cardiac Auscultation: distant heart tones, but otherwise normal S1 & S2, no S3 or S4, no rub  Murmurs: no cardiac murmurs ?  Extremities: trace bilateral edema in lower extremities. Several varicose veins. Chronic venous stasis changes.  Abdominal Exam: obese, non-tender, no masses, bowel sounds+, nonpalpable abdominal aorta  Liver & Spleen: difficult to assess due to obese body habitus  Neurologic Exam: oriented to time, place and person; no focal neurologic deficits  ?      Assessment and Plan     79 y.o. male with the following medical problems:    ?  Recent hospitalizations for acute hypoxic respiratory failure & COVID as detailed above.   ? CAD: chronic total occlusion of the RCA, mid-LCX & OM2.  ? CTO PCI of the RCA 04/02/17  ? CTO PCI of the LCX 06/29/17  ? Chronic combined systolic and diastolic heart failure.  ? Severe left ventricular dysfunction s/p CRT-D ([facility]) on 11/25/15.  ? CardioMEMS pulmonary artery pressure sensor (03/31/17).  ? Mixed (ischemic and nonischemic) cardiomyopathy.   ? Large infrarenal AAA s/p endovascular repair (10/25/12).  ? Hypertension.  ? Chronic kidney disease.  ? Dyslipidemia.  ? Obesity, Body mass index is 38.77 kg/m?.  ? Significant former tobacco abuse.  ? COPD.  ? OSA on CPAP.  ? 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       Dictation on: 03/03/2019  8:54 PM by: Burnett Sheng [RFERRELL]            Current Medications (including today's revisions)  Current Outpatient Medications on File Prior to Visit   Medication Sig Dispense Refill   ? acetaminophen (TYLENOL) 325 mg tablet Take two tablets by mouth every 6 hours as needed for Pain. 30 tablet 0 ? albuterol 0.083% (PROVENTIL) 2.5 mg /3 mL (0.083 %) nebulizer solution Inhale 3 mL solution by nebulizer as directed every 6 hours as needed for Wheezing or Shortness of Breath. Indications: asthma 270 mL 11   ? albuterol sulfate (PROAIR HFA) 90 mcg/actuation aerosol inhaler Inhale two puffs by mouth into the lungs every 4 hours as needed for Wheezing or Shortness of Breath. 18 g 11   ? allopurinoL (ZYLOPRIM) 300 mg tablet Take one tablet by mouth daily. Take with food. 90 tablet 1   ? AMITR/GABAPEN/EMU OIL 05/24/08% CREAM (COMPOUND) Apply 5 grams (10 pumps) topically to affected area three times daily. 50 g 11   ? Ammonium Lactate-Emu Oil (EMU-LAC) 10 % crea Apply  topically to affected area. Apply to feet      ? ascorbic acid (VITAMIN C) 500 mg tablet Take one tablet by mouth daily. 90 tablet 3   ? aspirin EC 81 mg tablet Take one tablet by mouth daily. Take with food. 90 tablet 3   ? atorvastatin (LIPITOR) 40 mg tablet TAKE (1) TABLET BY MOUTH ONCE DAILY 90 tablet 3   ? bacitracin 500 unit/g topical ointment Apply to affected area TID 28 g 1   ? benzonatate (TESSALON PERLES) 100 mg capsule Take two capsules by mouth every 8 hours as needed for Cough. 20 capsule 0   ? budesonide-formoterol (SYMBICORT HFA) 160-4.5 mcg/actuation inhalation Inhale two puffs by mouth into the lungs twice daily. Rinse mouth after use. 10.2 g 11   ? bumetanide (BUMEX) 2 mg tablet Take one tablet by mouth daily. Take extra dose for swelling 180 tablet 3   ? cholecalciferol (VITAMIN D-3) 1,000 units tablet Take 1,000 Units by mouth daily.     ? docusate (COLACE) 100 mg capsule Take 100 mg by mouth daily as needed for Constipation.     ? doxycycline (MONODOX) 100 mg capsule Take one capsule by mouth daily. 90 capsule 5   ? duloxetine DR (CYMBALTA) 60 mg capsule Take one capsule by mouth daily. 90 capsule 3   ? empagliflozin (JARDIANCE) 10 mg tablet Take one tablet by mouth daily. 90 tablet 3 ? finasteride (PROSCAR) 5 mg tablet TAKE (1) TABLET BY MOUTH ONCE DAILY 90 tablet 1   ? fish oil- omega 3-DHA/EPA 300/1,000 mg capsule Take  1 capsule by mouth daily.     ? gabapentin (NEURONTIN) 400 mg capsule Take 1 capsule by mouth three times daily as needed.     ? ipratropium (ATROVENT) 0.03 % nasal spray Apply two sprays to each nostril as directed every 12 hours. 30 mL 3   ? Miscellaneous Medical Supply misc Rx: Please wrap legs with ACE bandage from toes as high up to the leg as possible bilaterally  Dx: Lymphedema 2 each 0   ? montelukast (SINGULAIR) 10 mg tablet Take one tablet by mouth at bedtime daily. 90 tablet 1   ? nitroglycerin (NITROSTAT) 0.4 mg tablet Place 0.4 mg under tongue every 5 minutes as needed for Chest Pain. Max of 3 tablets, call 911.     ? nystatin (NYSTOP) 100,000 unit/g topical powder Apply  topically to affected area four times daily.     ? pantoprazole DR (PROTONIX) 40 mg tablet TAKE (1) TABLET BY MOUTH TWO TIMES A DAY. 60 tablet 9   ? polyethylene glycol 3350 (MIRALAX) 17 g packet Take one packet by mouth as Needed.     ? sodium chloride 3 % nebulizer solution Inhale 4 mL by mouth into the lungs twice daily. Use with [place].  Indications: asthma, chronic cough, bronchiectasis 60 each 11   ? spironolactone (ALDACTONE) 25 mg tablet Take one tablet by mouth twice daily. Take with food. 180 tablet 3   ? tamsulosin (FLOMAX) 0.4 mg capsule Take one capsule by mouth daily. 90 capsule 3     No current facility-administered medications on file prior to visit.                Complexity of medical decision making was high due to this patient's multiple medical co-morbidities. Total time spent on today's office visit was 54.  This includes face-to-face in person visit with patient as well as nonface-to-face time including review of the EMR, labs, radiologic studies, echocardiogram, formulation of treatment plan, after visit summary, future disposition, and lastly on documentation.

## 2019-03-03 NOTE — Progress Notes
Daily CardioMEMS Readings    Goal PA Diastolic: 11-13 mmHg   PA Diastolic Thresholds: 9-15 mmHg      Taken on PA Systolic PA Diastolic PA Mean Heart Rate   [id number], 11:00 AM 26 mmHg 10 mmHg 15 mmHg 106 bpm   03-02-2019, 11:30 AM 29 mmHg 9 mmHg 16 mmHg 102 bpm   03-01-2019, 02:17 PM 29 mmHg 9 mmHg 16 mmHg 106 bpm   02-28-2019, 10:46 AM 27 mmHg 9 mmHg 15 mmHg 106 bpm   02-27-2019, 08:44 AM 29 mmHg 10 mmHg 17 mmHg 117 bpm   02-26-2019, 01:24 PM 30 mmHg 11 mmHg 17 mmHg 111 bpm   02-25-2019, 11:28 AM 26 mmHg 8 mmHg 14 mmHg 111 bpm   02-24-2019, 09:24 AM 28 mmHg 10 mmHg 16 mmHg 114 bpm   02-23-2019, 12:34 PM 29 mmHg 11 mmHg 17 mmHg 110 bpm   02-22-2019, 12:09 PM 27 mmHg 11 mmHg 16 mmHg 112 bpm   02-21-2019, 01:17 PM 29 mmHg 11 mmHg 17 mmHg 109 bpm   02-20-2019, 07:39 AM 25 mmHg 9 mmHg 14 mmHg 119 bpm   02-19-2019, 10:32 AM 30 mmHg 10 mmHg 17 mmHg 103 bpm   02-18-2019, 11:35 AM 27 mmHg 8 mmHg 15 mmHg 107 bpm   02-17-2019, 07:45 AM 22 mmHg 7 mmHg 12 mmHg 124 bpm   02-16-2019, 02:43 PM 32 mmHg 11 mmHg 18 mmHg 112 bpm   02-15-2019, 02:14 PM 30 mmHg 11 mmHg 17 mmHg 107 bpm   02-14-2019, 12:02 PM 24 mmHg 7 mmHg 13 mmHg 110 bpm   02-13-2019, 08:39 AM 25 mmHg 9 mmHg 14 mmHg 117 bpm   02-12-2019, 08:39 AM 25 mmHg 9 mmHg 14 mmHg 113 bpm   02-11-2019, 03:06 PM 28 mmHg 10 mmHg 16 mmHg 105 bpm   02-10-2019, 08:44 AM 25 mmHg 9 mmHg 14 mmHg 116 bpm   02-09-2019, 11:29 AM 31 mmHg 11 mmHg 17 mmHg 110 bpm   02-08-2019, 02:15 PM 30 mmHg 10 mmHg 17 mmHg 112 bpm   02-07-2019, 08:14 AM 30 mmHg 11 mmHg 17 mmHg 107 bpm   02-06-2019, 10:48 AM 31 mmHg 12 mmHg 18 mmHg 102 bpm   02-05-2019, 08:22 AM 27 mmHg 9 mmHg 15 mmHg 114 bpm   02-04-2019, 02:36 PM 36 mmHg 15 mmHg 22 mmHg 105 bpm   02-04-2019, 02:32 PM 33 mmHg 13 mmHg 19 mmHg 103 bpm   02-03-2019, 10:13 AM 25 mmHg 8 mmHg 14 mmHg 113 bpm

## 2019-03-12 ENCOUNTER — Encounter

## 2019-03-12 DIAGNOSIS — E1165 Type 2 diabetes mellitus with hyperglycemia: Secondary | ICD-10-CM

## 2019-03-12 DIAGNOSIS — J470 Bronchiectasis with acute lower respiratory infection: Secondary | ICD-10-CM

## 2019-03-12 DIAGNOSIS — J9612 Chronic respiratory failure with hypercapnia: Secondary | ICD-10-CM

## 2019-03-12 DIAGNOSIS — F331 Major depressive disorder, recurrent, moderate: Secondary | ICD-10-CM

## 2019-03-12 DIAGNOSIS — J209 Acute bronchitis, unspecified: Secondary | ICD-10-CM

## 2019-03-12 DIAGNOSIS — I5042 Chronic combined systolic (congestive) and diastolic (congestive) heart failure: Secondary | ICD-10-CM

## 2019-03-12 MED ORDER — PREDNISONE 20 MG PO TAB
40 mg | ORAL_TABLET | Freq: Every day | ORAL | 0 refills | Status: AC
Start: 2019-03-12 — End: ?

## 2019-03-12 MED ORDER — AZITHROMYCIN 250 MG PO TAB
ORAL_TABLET | Freq: Every day | 0 refills | Status: DC
Start: 2019-03-12 — End: 2019-07-25

## 2019-03-12 NOTE — Progress Notes
Did patient read financial policy, consent to treat, and notice of privacy practices? Yes    Does the patient give verbal consent to each policy? Yes    Does the patient have any vitals to report? Yes    Weight Vitals charted in O2? Yes    Is the patient in pain? 0 = No pain    Screening questions completed? Yes    Is the patient able to acces the Mychart message with the start visit link? Yes    Is patient in "virtual waiting room" Yes

## 2019-03-12 NOTE — Progress Notes
History of Present Illness  Ruben Wood. is a 79 y.o. male     Obtained patient's verbal consent to treat them and their agreement to [facility] financial policy and NPP via this telehealth visit during the [facility]    Over the past several days, [name] has had increased cough from his baseline as well as increase sputum production.  He denies any chest pain.  He does have increased shortness of breath.  He is on oxygen chronically and he has not noticed any need to increase his oxygen levels at this stage.  He denies any fevers or chills.  He denies any chest pain.  He does have known bronchiectasis and is on chronic antibiotic suppressive therapy with doxycycline which he has been taking regularly.    He notes that his heart failure seems to be under good control currently.  His swelling in his feet or ankles has improved.  He denies any significant abdominal distention.  He does have some increase shortness of breath as mentioned above.  He is on goal-directed medical therapy intolerant as well with any side effects.    He does have diabetes and is on Jardiance.  His A1c most recently was 6.3%.  He is tolerating Jardiance well.  His blood sugar this morning was in the 140s.    He does have depression.  This has been relatively stable.  I am treating him with duloxetine.  He is going to start doing a support group for COVID-19 and [employer] [place].       Review of Systems  Per HPI    Objective:         ? acetaminophen (TYLENOL) 325 mg tablet Take two tablets by mouth every 6 hours as needed for Pain.   ? albuterol 0.083% (PROVENTIL) 2.5 mg /3 mL (0.083 %) nebulizer solution Inhale 3 mL solution by nebulizer as directed every 6 hours as needed for Wheezing or Shortness of Breath. Indications: asthma   ? albuterol sulfate (PROAIR HFA) 90 mcg/actuation aerosol inhaler Inhale two puffs by mouth into the lungs every 4 hours as needed for Wheezing or Shortness of Breath. ? allopurinoL (ZYLOPRIM) 300 mg tablet Take one tablet by mouth daily. Take with food.   ? AMITR/GABAPEN/EMU OIL 05/24/08% CREAM (COMPOUND) Apply 5 grams (10 pumps) topically to affected area three times daily.   ? Ammonium Lactate-Emu Oil (EMU-LAC) 10 % crea Apply  topically to affected area. Apply to feet    ? ascorbic acid (VITAMIN C) 500 mg tablet Take one tablet by mouth daily.   ? aspirin EC 81 mg tablet Take one tablet by mouth daily. Take with food.   ? atorvastatin (LIPITOR) 40 mg tablet TAKE (1) TABLET BY MOUTH ONCE DAILY   ? azithromycin (ZITHROMAX) 250 mg tablet Z-PAK: Take 2 tabs by mouth on day 1, followed by 1 tab by mouth daily on days 2-5.   ? bacitracin 500 unit/g topical ointment Apply to affected area TID   ? benzonatate (TESSALON PERLES) 100 mg capsule Take two capsules by mouth every 8 hours as needed for Cough.   ? budesonide-formoterol (SYMBICORT HFA) 160-4.5 mcg/actuation inhalation Inhale two puffs by mouth into the lungs twice daily. Rinse mouth after use.   ? bumetanide (BUMEX) 2 mg tablet Take one tablet by mouth daily. Take extra dose for swelling   ? cholecalciferol (VITAMIN D-3) 1,000 units tablet Take 1,000 Units by mouth daily.   ? docusate (COLACE) 100 mg capsule Take  100 mg by mouth daily as needed for Constipation.   ? doxycycline (MONODOX) 100 mg capsule Take one capsule by mouth daily.   ? duloxetine DR (CYMBALTA) 60 mg capsule Take one capsule by mouth daily.   ? empagliflozin (JARDIANCE) 10 mg tablet Take one tablet by mouth daily.   ? finasteride (PROSCAR) 5 mg tablet TAKE (1) TABLET BY MOUTH ONCE DAILY   ? fish oil- omega 3-DHA/EPA 300/1,000 mg capsule Take 1 capsule by mouth daily.   ? gabapentin (NEURONTIN) 400 mg capsule Take 1 capsule by mouth three times daily as needed.   ? ipratropium (ATROVENT) 0.03 % nasal spray Apply two sprays to each nostril as directed every 12 hours. ? Miscellaneous Medical Supply misc Rx: Please wrap legs with ACE bandage from toes as high up to the leg as possible bilaterally  Dx: Lymphedema   ? montelukast (SINGULAIR) 10 mg tablet Take one tablet by mouth at bedtime daily.   ? nitroglycerin (NITROSTAT) 0.4 mg tablet Place 0.4 mg under tongue every 5 minutes as needed for Chest Pain. Max of 3 tablets, call 911.   ? nystatin (NYSTOP) 100,000 unit/g topical powder Apply  topically to affected area four times daily.   ? pantoprazole DR (PROTONIX) 40 mg tablet TAKE (1) TABLET BY MOUTH TWO TIMES A DAY.   ? polyethylene glycol 3350 (MIRALAX) 17 g packet Take one packet by mouth as Needed.   ? predniSONE (DELTASONE) 20 mg tablet Take two tablets by mouth daily with breakfast for 5 days.   ? sodium chloride 3 % nebulizer solution Inhale 4 mL by mouth into the lungs twice daily. Use with [place].  Indications: asthma, chronic cough, bronchiectasis   ? spironolactone (ALDACTONE) 25 mg tablet Take one tablet by mouth twice daily. Take with food.   ? tamsulosin (FLOMAX) 0.4 mg capsule Take one capsule by mouth daily.     Vitals:    03/12/19 1116   PainSc: Zero     Vitals:    03/12/19 1116   PainSc: Zero       There is no height or weight on file to calculate BMI.     Physical Exam  Constitutional:       Appearance: Normal appearance.   HENT:      Head: Normocephalic and atraumatic.   Pulmonary:      Effort: Pulmonary effort is normal.   Neurological:      General: No focal deficit present.      Mental Status: He is alert.   Psychiatric:         Mood and Affect: Mood normal.         Labwork reviewed:  Lab Results   Component Value Date/Time    HGBA1C 6.3 (H) 12/05/2018 01:57 AM    HGBA1C 6.7 (H) 08/12/2018    HGBA1C 6.8 (H) 04/03/2017 04:39 AM    HGBPOC 11.2 (L) 10/25/2012 10:58 AM    HGBPOC 12.9 (L) 10/25/2012 08:55 AM    TSH 4.37 12/20/2018 08:09 PM    FREET4R 0.99 12/15/2014 09:12 AM    CHOL 106 12/05/2018 01:57 AM    TRIG 108 12/05/2018 01:57 AM HDL 31 (L) 12/05/2018 01:57 AM    LDL 60 12/05/2018 01:57 AM    NA 140 02/17/2019    K 3.8 02/17/2019    CL 99 02/17/2019    CO2 28.9 02/17/2019    GAP 9 12/31/2018 12:04 PM    BUN 18 02/17/2019    CR 1.2 02/17/2019  GLU 110 02/17/2019    CA 8.7 02/17/2019    PO4 3.2 12/21/2018 10:34 PM    ALBUMIN 2.9 (L) 12/26/2018 05:19 AM    TOTPROT 5.7 (L) 12/26/2018 05:19 AM    ALKPHOS 69 12/26/2018 05:19 AM    AST 22 12/26/2018 05:19 AM    ALT 38 12/26/2018 05:19 AM    TOTBILI 0.3 12/26/2018 05:19 AM    GFR 62 02/17/2019    GFRAA >60 12/31/2018 12:04 PM    PSA 2.96 11/25/2018              Assessment and Plan:    1. Chronic bronchitis with acute exacerbation (HCC)    2. Chronic respiratory failure with hypercapnia (HCC)    3. Chronic combined systolic and diastolic CHF (congestive heart failure) (HCC)    4. Bronchiectasis with acute lower respiratory infection (HCC)    5. Type 2 diabetes mellitus with hyperglycemia, without long-term current use of insulin (HCC)    6. Moderate episode of recurrent major depressive disorder (HCC)      I am seeing him today with increased cough and sputum production that is quite consistent with his prior episodes of bronchiectasis with an acute exacerbation.  I will treat him with a prednisone burst with 40 mg of prednisone x5 days as well as start him on azithromycin.  He will me know if things are not improving quickly.  In the past, sometimes we have needed to do a prednisone taper, but hoping we can do a burst.  His diabetes has been doing really well, so I want to avoid having steroid-induced hyperglycemia.    From a heart failure standpoint, he seems to be relatively well compensated.  His lower extremity edema is better than it usually is.  I did not make any changes to his medications today.    From a diabetes standpoint, we will continue with the Jardiance.  His A1c most recently was 6.2%. From a depression standpoint, I continued his duloxetine.  I did encourage him to do the support group at [employer] [place].  I think this is a good idea especially with his recovery from COVID-19.    There are no Patient Instructions on file for this visit.    Return in about 4 weeks (around 04/09/2019) for Telehealth Appt.

## 2019-03-13 DIAGNOSIS — J42 Unspecified chronic bronchitis: Secondary | ICD-10-CM

## 2019-03-14 ENCOUNTER — Encounter: Admit: 2019-03-14 | Discharge: 2019-03-14 | Payer: MEDICARE

## 2019-03-14 ENCOUNTER — Ambulatory Visit: Admit: 2019-03-14 | Discharge: 2019-03-15 | Payer: MEDICARE

## 2019-03-14 DIAGNOSIS — J45909 Unspecified asthma, uncomplicated: Secondary | ICD-10-CM

## 2019-03-14 DIAGNOSIS — Z8614 Personal history of Methicillin resistant Staphylococcus aureus infection: Secondary | ICD-10-CM

## 2019-03-14 DIAGNOSIS — N183 CKD (chronic kidney disease) stage 3, GFR 30-59 ml/min: Secondary | ICD-10-CM

## 2019-03-14 DIAGNOSIS — G4733 Obstructive sleep apnea (adult) (pediatric): Secondary | ICD-10-CM

## 2019-03-14 DIAGNOSIS — M961 Postlaminectomy syndrome, not elsewhere classified: Secondary | ICD-10-CM

## 2019-03-14 DIAGNOSIS — I714 Abdominal aortic aneurysm, without rupture: Secondary | ICD-10-CM

## 2019-03-14 DIAGNOSIS — I10 Essential (primary) hypertension: Secondary | ICD-10-CM

## 2019-03-14 DIAGNOSIS — N401 Enlarged prostate with lower urinary tract symptoms: Secondary | ICD-10-CM

## 2019-03-14 DIAGNOSIS — J4550 Severe persistent asthma, uncomplicated: Secondary | ICD-10-CM

## 2019-03-14 DIAGNOSIS — J449 Chronic obstructive pulmonary disease, unspecified: Secondary | ICD-10-CM

## 2019-03-14 DIAGNOSIS — J479 Bronchiectasis, uncomplicated: Secondary | ICD-10-CM

## 2019-03-14 DIAGNOSIS — M954 Acquired deformity of chest and rib: Secondary | ICD-10-CM

## 2019-03-14 DIAGNOSIS — I509 Heart failure, unspecified: Secondary | ICD-10-CM

## 2019-03-14 DIAGNOSIS — E782 Mixed hyperlipidemia: Secondary | ICD-10-CM

## 2019-03-14 DIAGNOSIS — Z8679 Personal history of other diseases of the circulatory system: Secondary | ICD-10-CM

## 2019-03-14 DIAGNOSIS — I5042 Chronic combined systolic (congestive) and diastolic (congestive) heart failure: Secondary | ICD-10-CM

## 2019-03-14 DIAGNOSIS — Z95828 Presence of other vascular implants and grafts: Secondary | ICD-10-CM

## 2019-03-14 DIAGNOSIS — J302 Other seasonal allergic rhinitis: Secondary | ICD-10-CM

## 2019-03-14 DIAGNOSIS — K219 Gastro-esophageal reflux disease without esophagitis: Secondary | ICD-10-CM

## 2019-03-14 DIAGNOSIS — I2510 Atherosclerotic heart disease of native coronary artery without angina pectoris: Secondary | ICD-10-CM

## 2019-03-14 DIAGNOSIS — R05 Cough: Secondary | ICD-10-CM

## 2019-03-14 DIAGNOSIS — L03116 Cellulitis of left lower limb: Secondary | ICD-10-CM

## 2019-03-14 DIAGNOSIS — R0600 Dyspnea, unspecified: Secondary | ICD-10-CM

## 2019-03-14 DIAGNOSIS — I429 Cardiomyopathy, unspecified: Secondary | ICD-10-CM

## 2019-03-14 DIAGNOSIS — R609 Edema, unspecified: Secondary | ICD-10-CM

## 2019-03-14 DIAGNOSIS — Z9981 Dependence on supplemental oxygen: Secondary | ICD-10-CM

## 2019-03-14 DIAGNOSIS — J189 Pneumonia, unspecified organism: Secondary | ICD-10-CM

## 2019-03-14 NOTE — Progress Notes
Telehealth Visit Note    Date of Service: 03/14/2019    Subjective:      Obtained patient's verbal consent to treat them and their agreement to [facility] financial policy and NPP via this telehealth visit during the [facility]       Ruben Wood. Ruben Wood is a 79 y.o. male.    History of Present Illness    Pt is being seen today via telehealth for pulmonary follow-up.  Since our last appointment he reports having some increased wheeze with increased cough with mucus production.  He was seen by Dr. Sherryll Burger in cardiology on 01-31-19 and a CXR showed some bibasilar reticulations and chronic changes.  He did not have consolidation.  He was given antibiotics by Dr. Crecencio Mc a couple days ago.  He held off on taking the medication.  His cough is getting worse again.  Pt plans on taking antibiotics and prednisone due to worsening cough.  I supported him in this decision.  His oxygen level will drop into the upper 80's on occasion.  Sometimes this correlates with shortness of breath and other times it does not.  He will place oxygen on when having difficulty with breathing.  His walking is a struggle.  He is very weak.  He is trying to work on weight control.  He continues to attend PT and rehab which I encouraged.       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 Notably he missed several days of diuretics while in an inpatient rehab facility the prior week. On admission febrile and requiring 60L O2/min 100% FiO2. ?He was treated with IV diuresis, ceftriaxone for a suspected UTI and vancomycin for suspected left lower extremity cellulitis. ?He was was started on a prolonged high dose dexamethasone taper due to concern for a post-covid fibroproliferative pulmonary disorder. ?He was stabilized and transferred to the floor 11/1.   ?  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 Pt with edema and possible CHF, however, still with hypoxia after adequate diuresis  ?  CT Chest on 11/2: Decrease in patchy, mid and lower lung predominant groundglass opacities within both lungs, with increasing areas of reticulation, traction bronchiectasis, and architectural distortion in the areas, compatible with developing fibrosis. Again, findings are likely related to reported prior COVID 19 infection. Mild cardiomegaly and at least moderate coronary artery calcification.   ?  Pt started on dexamethasone due to concern for fibrosis related to covid, transitioned to prednisone tomorrow per pulm recs. Pt will take 30mg  for total 7 days, then 20mg  until follow up with pulm as outpt.   ? Pt satting well on 2-3 L oxygen at rest, 4L needed on ex ox with RT. CM to arrange home oxygen. Will need restart doxycycline on discharge. Pulm consult followed during admission and will follow as outpt. Cont bipap QHS, albuterol and atrovent nebs, symbicort and hypertonic saline nebs. Cont PJP prophylaxis while on high dose steroid.   ?  2)ID- UTI, cellulitis  Pt treated with vancomycin and rocephin. Stopped on 11/5, will monitor off antibiotics for sx of recurrence. Once pt improved from respiratory status, could  consider outpt evaluation by vascular surgery, will defer to PCP to arrange in future. ID followed during admission, appreciate assistance. Cont amitryptiline/gaba/emu oil for legs  ?  Art [place] Right leg: Interval development of moderate to moderately severe predominantly calcified plaque in the distal right superficial femoral artery with spectral findings compatible with 50-70% stenosis and minimal findings of arterial insufficiency distally; Left leg Tortuosity and collaterals in the mid superficial femoral artery with mild arterial insufficiency changes distally without visualization of a focal critical stenosis Interval development of abnormally low arterial resistance throughout the trifurcation and dorsalis pedis arteries with no focal stenosis identified. Findings could be secondary to hyperemia associated with wounds or other sources of infection. Mild superimposed arterial insufficiency could also contribute to these findings   ?  3)Cardio- ?coronary disease status post stenting, heart failure with reduced ejection fraction, ischemic cardiomyopathy status post AICD, AAA status post EVAR in 2014, hyperlipidemia  Pt aggressively diuresed with bumex. Daily CardioMEMs on 11/2 PAD is 12 mmHg; goal PAD 11-13 mmHg, reading on 11/4 was 5. Cont asa, fish oil, bumex 2mg  daily, sprinolactone 25mg  bid. CHF team following, appreciate their assistance  ?  4)Renal-CKD III, BPH  -cont finasteride and flomax  ? 5)FEN- cont vitamin B 12 injection for deficiency  ?  6)GI- cont PPI while on steroids  ?  CT Chest from 11-2:  CT Chest     Clinical Indication: ?Evaluate coated fibrosis     Technique: Multiple contiguous axial CT images were obtained through the   chest without IV contrast. Post processing coronal and sagittal   reconstruction images were made from the axial images.     IV contrast: None.     Comparison: CT chest 12/14/2018     Findings:     Evaluation of the mediastinum and hila, including the vasculature and for   lymphadenopathy, is limited without the use of IV contrast.     Axilla, Mediastinum and Hila: No axillary, mediastinal, or hilar   adenopathy.     Heart and Great Vessels: Heart is mildly enlarged without significant   pericardial effusion. Cardiac conduction device remains in place. At least   moderate coronary artery calcification. CardioMEMs device again seen   within the left lower lobe pulmonary artery.     Airway, Lungs and Pleura: Mild emphysema. Decrease in previously seen mid   and lower lung predominant groundglass opacities, with increasing   reticulation, traction bronchiectasis, and architectural distortion in   these areas. No new consolidation or groundglass opacity. No pleural   effusion.[name]     Upper Abdomen: Surgical clips in the gallbladder fossa. Partial   visualization of a left lateral abdominal wall hernia.     Chest Wall and Osseous Structures: Mild bilateral gynecomastia. Thoracic   spondylosis. Old rib fractures. No aggressive osseous lesions.     IMPRESSION     1. Decrease in patchy, mid and lower lung predominant groundglass   opacities within both lungs, with increasing areas of reticulation,   traction bronchiectasis, and architectural distortion in the areas,   compatible with developing fibrosis. Again, findings are likely related to   reported prior COVID 19 infection.   2. Mild cardiomegaly and at least moderate coronary artery calcification. ?Finalized by Francis Dowse, M.D. on 12/24/2018 7:58 AM. Dictated by Francis Dowse, M.D. on 12/24/2018 7:47 AM.   ?  Review of Systems  Baseline shortness of breath and marked fatigue.  Feels globally weak as well.  Remainder of 14 point  ROS discussed and reported as negative.  ?  Results for DECORION, VOTH ED (MRN [id number]) as of 03/09/2018 12:53  ? Ref. Range 05/21/2017 04:27 07/02/2017 19:00 08/28/2017 17:18   Absolute Eosinophil Count Latest Ref Range: 0 - 0.45 K/UL 0.10 0.10 0.40   ?  Results for NASARIO, MASEK ED (MRN [id number]) as of 03/09/2018 12:53  ? Ref. Range 02/25/2016 16:10 09/14/2016 12:57 10/06/2016 12:21 02/28/2017 16:15 04/19/2017 11:45 05/05/2017 04:01 09/11/2017 13:56   C-ANCA Latest Units: TITER <20,NEGATIVE ? ? ? ? ? ?   P-ANCA Latest Units: TITER <20,NEGATIVE ? ? ? ? ? ?   Myeloperoxidase AB Unknown <0.2.Marland KitchenMarland Kitchen ? ? ? ? ? ?   Serine Protease3 AB Unknown <0.2.Marland KitchenMarland Kitchen ? ? ? ? ? ?   Complement CH50 Latest Ref Range: 41 - 95 U/mL 67 ? ? ? ? ? ?   IgG Latest Ref Range: 762 - 1,488 MG/DL 191 (L) 478 (L) ? 531 (L) 472 (L) 492 (L) 737 (L)   IgM Latest Ref Range: 38 - 328 MG/DL 295 ? ? 621 ? ? 308   IgE Latest Ref Range: <165 IU/ML 17 ? 31 ? ? ? ?   IgA Latest Ref Range: 70 - 390 MG/DL 657 ? ? 846 ? ? 962   ?  ?  03-02-2018:  CT Chest:  IMPRESSION    1. ?Mild emphysema with scattered areas of scarring. Mild lower lobe   bronchiectasis with areas of peribronchial thickening and mild patchy and   reticular opacities, greater on the left, which may be from chronic   scarring and/or pneumonitis possibly from aspiration. No superimposed   consolidation or pleural effusion.    2. Mild cardiomegaly. Coronary artery and aortic valve calcifications.    3. No thoracic lymphadenopathy.      ?Finalized by Ancil Boozer, M.D. on 03/02/2018 11:19 AM. Dictated by Ancil Boozer, M.D. on 03/02/2018 11:05 AM.  ?  ECHO 12-2017:  Interpretation Summary   ?  ?  ? Technically difficult study with poor endocardial visualization. ? Wall motion abnormality as below is similar to prior echo.  ? Valves not seen but no doppler evidence of significant valvular disease.  ?  ? LV is moderately dilated by volume criteria.   ? Reduced left ventricular systolic function with EF = 45%.  ? Estimated pulmonary artery systolic pressure is 40?mmHg.  ? In comparison to prior study dated 05/03/17, no significant changes are seen. ?  ?   ?  Nuclear Report  ?  [facility]???????????????????????????  Divison of Nuclear Cardiac Imaging  Consultation Report  ?  EXAMINATION:?18m?Technetium-Pyrophosphate imaging for transthyretin cardiac amyloidosis.  ?  Date of Study: ?02/27/18  Study #: ?[id number]  [place] MRN: ?[id number]  Silas Billing ID: ?[id number]  Referring Physician: ?Vena Austria, MD  Requested by: ?Lamont Snowball, MD  BMI: 38.5?kg/m2  ?  INDICATIONS FOR STUDY (HISTORY):?This is a 79 year old gentleman being evaluated for moderate concentric left ventricular hypertrophy and reduced global left ventricular ejection fraction.  ?  PROCEDURAL DETAILS:?Two hours after weight-adjusted intravenous injection of 21.8?mCi of 66m?Technetium-Pyrophosphate, Planar?and SPECT?images were obtained. The exam was performed supine with low energy-high resolution collimators adjusting acquisitions of 750,000 counts. Heart-contralateral lung (H/CL) ratio was calculated at the two hour acquisition. ?Comparisons of heart uptake to rib uptake were made, and then graded on a scale of 0-3.  ?  FINDINGS:   2 hours post tracer injection there is normal  rib and bone uptake but no myocardial uptake of tracer.?  ?  SCINTIGRAPIC (Planar/SPECT):  Both planar and SPECT images are negative for myocardial uptake of tracer.  ?  Two hour heart/contralateral lung (H/CL) ratio: ?1.115?this metric is negative and not suggestive of ATTR cardiac amyloidosis and falls within the low equivocal range.  ?  ?  Visual Semi-Quantitative Grading Scale Analysis:? This study is grade 0 and not suggestive of ATTR cardiac amyloidosis.  ?  SUMMARY/OPINION:   This study is negative and not suggestive of ATTR cardiac amyloidosis. ?The low equivocal heart contralateral lung ratio is likely due to variable attenuation and slight residual blood pool activity. ?The negative study does not exclude AL cardiac amyloidosis    Objective:         ? acetaminophen (TYLENOL) 325 mg tablet Take two tablets by mouth every 6 hours as needed for Pain.   ? albuterol 0.083% (PROVENTIL) 2.5 mg /3 mL (0.083 %) nebulizer solution Inhale 3 mL solution by nebulizer as directed every 6 hours as needed for Wheezing or Shortness of Breath. Indications: asthma   ? albuterol sulfate (PROAIR HFA) 90 mcg/actuation aerosol inhaler Inhale two puffs by mouth into the lungs every 4 hours as needed for Wheezing or Shortness of Breath.   ? allopurinoL (ZYLOPRIM) 300 mg tablet Take one tablet by mouth daily. Take with food.   ? AMITR/GABAPEN/EMU OIL 05/24/08% CREAM (COMPOUND) Apply 5 grams (10 pumps) topically to affected area three times daily.   ? Ammonium Lactate-Emu Oil (EMU-LAC) 10 % crea Apply  topically to affected area. Apply to feet    ? ascorbic acid (VITAMIN C) 500 mg tablet Take one tablet by mouth daily.   ? aspirin EC 81 mg tablet Take one tablet by mouth daily. Take with food.   ? atorvastatin (LIPITOR) 40 mg tablet TAKE (1) TABLET BY MOUTH ONCE DAILY   ? azithromycin (ZITHROMAX) 250 mg tablet Z-PAK: Take 2 tabs by mouth on day 1, followed by 1 tab by mouth daily on days 2-5.   ? bacitracin 500 unit/g topical ointment Apply to affected area TID   ? benzonatate (TESSALON PERLES) 100 mg capsule Take two capsules by mouth every 8 hours as needed for Cough.   ? budesonide-formoterol (SYMBICORT HFA) 160-4.5 mcg/actuation inhalation Inhale two puffs by mouth into the lungs twice daily. Rinse mouth after use.   ? bumetanide (BUMEX) 2 mg tablet Take one tablet by mouth daily. Take extra dose for swelling ? cholecalciferol (VITAMIN D-3) 1,000 units tablet Take 1,000 Units by mouth daily.   ? docusate (COLACE) 100 mg capsule Take 100 mg by mouth daily as needed for Constipation.   ? doxycycline (MONODOX) 100 mg capsule Take one capsule by mouth daily.   ? duloxetine DR (CYMBALTA) 60 mg capsule Take one capsule by mouth daily.   ? empagliflozin (JARDIANCE) 10 mg tablet Take one tablet by mouth daily.   ? finasteride (PROSCAR) 5 mg tablet TAKE (1) TABLET BY MOUTH ONCE DAILY   ? fish oil- omega 3-DHA/EPA 300/1,000 mg capsule Take 1 capsule by mouth daily.   ? gabapentin (NEURONTIN) 400 mg capsule Take 1 capsule by mouth three times daily as needed.   ? ipratropium (ATROVENT) 0.03 % nasal spray Apply two sprays to each nostril as directed every 12 hours.   ? Miscellaneous Medical Supply misc Rx: Please wrap legs with ACE bandage from toes as high up to the leg as possible bilaterally  Dx: Lymphedema   ? montelukast (  SINGULAIR) 10 mg tablet Take one tablet by mouth at bedtime daily.   ? nitroglycerin (NITROSTAT) 0.4 mg tablet Place 0.4 mg under tongue every 5 minutes as needed for Chest Pain. Max of 3 tablets, call 911.   ? nystatin (NYSTOP) 100,000 unit/g topical powder Apply  topically to affected area four times daily.   ? pantoprazole DR (PROTONIX) 40 mg tablet TAKE (1) TABLET BY MOUTH TWO TIMES A DAY.   ? polyethylene glycol 3350 (MIRALAX) 17 g packet Take one packet by mouth as Needed.   ? predniSONE (DELTASONE) 20 mg tablet Take two tablets by mouth daily with breakfast for 5 days.   ? sodium chloride 3 % nebulizer solution Inhale 4 mL by mouth into the lungs twice daily. Use with [place].  Indications: asthma, chronic cough, bronchiectasis   ? spironolactone (ALDACTONE) 25 mg tablet Take one tablet by mouth twice daily. Take with food.   ? tamsulosin (FLOMAX) 0.4 mg capsule Take one capsule by mouth daily.     There were no vitals filed for this visit.  There is no height or weight on file to calculate BMI. Physical Exam    Appears to be at his baseline via monitor.  Will occasionally cough during our conversation.  Morbidly obese.  NAD.  Breathing comfortable.  Able to speak in complete sentences.        Assessment and Plan:    Mr. 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    Acute Bronchitis  -- Felt he was developing increased cough with mucus and wheeze.  -- Prescribed antibiotics and prednisone by PCP  -- Planning on initiating this therapy today.  -- I encouraged him to do this as well.  -- No obvious fevers.  -- Likely just a developing bronchitis  -- Reviewed his CXR from 01-31-19  IMPRESSION       1. No new consolidation.   2. Bibasilar opacities with reticulation better characterized on recent   chest CT.       ?Finalized by Delmar Landau, M.D. on 01/31/2019 1:23 PM. Dictated by   Delmar Landau, M.D. on 01/31/2019 1:20 PM.   ??  OSA  -- Continue current CPAP settings.  -- No complaints  ?  Allergic Asthma  - COVID infection in October 2020  - Hospitalized 02-2018 for decompensated heart failure, cellulitis and possible pneumonia.  - Hospitalized 04-20-2017 for combination of acute on chronic CHF exacerbation with probable asthma component as well.  - Hospitalized again in May 2019 for decompensated heart failure and probable asthma component as well.  - Hospitalized September 25th s/p fall with shoulder dislocation. ?Developed MRSA pneumonia while inpatient and at rehab.  - Asthma/Bronchiectasis is currently stable on therapy?but overall poorly controlled.??Difficult to ascertain asthma from CHF at times.??Symbicort, albuterol, ipratropium, montelukast.  - Pt lungs are clear of wheeze on exam. ?Some mild bibasilar crackles.  - History of eosinophilia.  ?  Results for TYCE, TRINER (MRN [id number]) as of 10/03/2017 09:23  ? Ref. Range 08/28/2017 17:18 Eosinophils Latest Ref Range: 0 - 5 % 5   Absolute Eosinophil Count Latest Ref Range: 0 - 0.45 K/UL 0.40   ?  ?  Allergic Rhinitis  - Fall worst time of year  - Some increased sinus drainage. ?Mucus cough controlled with use of mucinex.  ?????????????>?Continue Singulair  ?????????????>?Remainder per Allergy  ?  Chronic sinusitis:?unchanged  -  Stable  ?  Bronchiectasis  -?Proven RML bronchiectasis on previous outside CT scan  -?Continue aggressive pulmonary hygeine with flutter valve as needed.??Doing much better with addition of VEST and 3% saline therapy. ?Continue.  - Cough?control is better.  ?  Concern for aspiration:?swallow study negative previously.  ?  GERD:?Continue PPI per GI?(stable)  ?  S/p lung herniation with bio-bridge repair.  - Follow-up outpatient with Dr. Seth Bake. ?Likely not repeat surgical candidate.  ?  AAA s/p endovascular repair:?Follow up with Dr. Chales Abrahams as scheduled  ?  CAD/CHF: currently compensated  -- ?Pt was doing well  -- ?Re-initiate cardiac rehab once he has recovered from his injuries.   ?  Morbid obesity  - Reports some weight gain with prednisone use  ?????????????>?Encouraged continued activity and attempts at weight loss  ?  BPH:?Previously referred to urology  ?  Chronic respiratory failure with hypoxia:  -- Pt requires supplemental oxygen with exertion and at rest prn.  ?  Cellulitis:  -- Improving.  ?  ?  RTC?in 4 to 6 months via telehealth/in-person appointment.  Flu vaccine?UTD.  UTD on pneumonia vaccines  Call with changes in symptoms or worsening lung issues.  COVID vaccination encouraged once available.                           25 minutes spent on this patient's encounter with counseling and coordination of care taking >50% of the visit.

## 2019-03-31 ENCOUNTER — Ambulatory Visit: Admit: 2019-03-31 | Discharge: 2019-03-31 | Payer: MEDICARE

## 2019-03-31 DIAGNOSIS — I5042 Chronic combined systolic (congestive) and diastolic (congestive) heart failure: Secondary | ICD-10-CM

## 2019-04-02 ENCOUNTER — Encounter: Admit: 2019-04-02 | Discharge: 2019-04-02 | Payer: MEDICARE

## 2019-04-02 NOTE — Telephone Encounter
Pharmacy requesting: duloxetine  LR: 03/12/2018  LOV: 03/12/19  This is not a standing order. Routing to Dr. Crecencio Mc to approve.   Etta Grandchild, RN

## 2019-04-03 MED ORDER — DULOXETINE 60 MG PO CPDR
60 mg | ORAL_CAPSULE | Freq: Every day | ORAL | 3 refills | 60.00000 days | Status: AC
Start: 2019-04-03 — End: ?

## 2019-04-04 ENCOUNTER — Encounter: Admit: 2019-04-04 | Discharge: 2019-04-04 | Payer: MEDICARE

## 2019-04-04 MED ORDER — ALLOPURINOL 300 MG PO TAB
ORAL_TABLET | Freq: Every day | ORAL | 1 refills | 60.00000 days | Status: DC
Start: 2019-04-04 — End: 2019-07-28

## 2019-04-04 NOTE — Telephone Encounter
Pharmacy requesting: allopurinol  LR:08/29/2018  LOV:03/12/2019  Per IM general medicine protocol, medication was signed and sent to pharmacy.   Etta Grandchild, RN

## 2019-04-08 ENCOUNTER — Encounter: Admit: 2019-04-08 | Discharge: 2019-04-08 | Payer: MEDICARE

## 2019-04-08 ENCOUNTER — Ambulatory Visit: Admit: 2019-04-08 | Discharge: 2019-04-09 | Payer: MEDICARE

## 2019-04-08 DIAGNOSIS — J9612 Chronic respiratory failure with hypercapnia: Secondary | ICD-10-CM

## 2019-04-08 DIAGNOSIS — B948 Sequelae of other specified infectious and parasitic diseases: Secondary | ICD-10-CM

## 2019-04-08 DIAGNOSIS — J209 Acute bronchitis, unspecified: Secondary | ICD-10-CM

## 2019-04-08 NOTE — Progress Notes
History of Present Illness  Ruben Wood. is a 79 y.o. male. Ruben Wood is accompanied at his telehealth visit by his wife Ruben Wood.     Obtained patient's verbal consent to treat them and their agreement to [facility] financial policy and NPP via this telehealth visit during the [facility]    Ruben Wood is a 79 year old male who returns with persistent cough and allergies. He reports that once he starts coughing it goes on continuously. He started taking azithromycin and has continued this medication through today. He has been using his chest vibrator to lessen the phlegm and has been using his inhalers. He has had no fever or chills. He is currently using his oxygen which he states he hasn't used a lot lately; however, he just felt like he needed it today. His oxygen level is currently 95. He does not feel as if he is retaining fluid and he denies lower extremity edema.    Ruben Wood's wife tells me he is going to physical therapy and speech therapy and they are working on his memory. She is not sure how much that will help his memory and wonders if there is a medication that will help his memory.    He is scheduled to receive his first COVID-19 vaccination on 04/10/2019.        Review of Systems  Per HPI    Objective:         ? acetaminophen (TYLENOL) 325 mg tablet Take two tablets by mouth every 6 hours as needed for Pain.   ? albuterol 0.083% (PROVENTIL) 2.5 mg /3 mL (0.083 %) nebulizer solution Inhale 3 mL solution by nebulizer as directed every 6 hours as needed for Wheezing or Shortness of Breath. Indications: asthma   ? albuterol sulfate (PROAIR HFA) 90 mcg/actuation aerosol inhaler Inhale two puffs by mouth into the lungs every 4 hours as needed for Wheezing or Shortness of Breath.   ? allopurinoL (ZYLOPRIM) 300 mg tablet TAKE (1) TABLET BY MOUTH ONCE DAILY WITH FOOD.   ? AMITR/GABAPEN/EMU OIL 05/24/08% CREAM (COMPOUND) Apply 5 grams (10 pumps) topically to affected area three times daily. ? Ammonium Lactate-Emu Oil (EMU-LAC) 10 % crea Apply  topically to affected area. Apply to feet    ? ascorbic acid (VITAMIN C) 500 mg tablet Take one tablet by mouth daily.   ? aspirin EC 81 mg tablet Take one tablet by mouth daily. Take with food.   ? atorvastatin (LIPITOR) 40 mg tablet TAKE (1) TABLET BY MOUTH ONCE DAILY   ? azithromycin (ZITHROMAX) 250 mg tablet Z-PAK: Take 2 tabs by mouth on day 1, followed by 1 tab by mouth daily on days 2-5.   ? bacitracin 500 unit/g topical ointment Apply to affected area TID   ? benzonatate (TESSALON PERLES) 100 mg capsule Take two capsules by mouth every 8 hours as needed for Cough.   ? budesonide-formoterol (SYMBICORT HFA) 160-4.5 mcg/actuation inhalation Inhale two puffs by mouth into the lungs twice daily. Rinse mouth after use.   ? bumetanide (BUMEX) 2 mg tablet Take one tablet by mouth daily. Take extra dose for swelling   ? cholecalciferol (VITAMIN D-3) 1,000 units tablet Take 1,000 Units by mouth daily.   ? docusate (COLACE) 100 mg capsule Take 100 mg by mouth daily as needed for Constipation.   ? doxycycline (MONODOX) 100 mg capsule Take one capsule by mouth daily.   ? duloxetine DR (CYMBALTA) 60 mg capsule Take one capsule by mouth daily.   ?  empagliflozin (JARDIANCE) 10 mg tablet Take one tablet by mouth daily.   ? finasteride (PROSCAR) 5 mg tablet TAKE (1) TABLET BY MOUTH ONCE DAILY   ? fish oil- omega 3-DHA/EPA 300/1,000 mg capsule Take 1 capsule by mouth daily.   ? gabapentin (NEURONTIN) 400 mg capsule Take 1 capsule by mouth three times daily as needed.   ? ipratropium (ATROVENT) 0.03 % nasal spray Apply two sprays to each nostril as directed every 12 hours.   ? Miscellaneous Medical Supply misc Rx: Please wrap legs with ACE bandage from toes as high up to the leg as possible bilaterally  Dx: Lymphedema   ? montelukast (SINGULAIR) 10 mg tablet Take one tablet by mouth at bedtime daily.   ? nitroglycerin (NITROSTAT) 0.4 mg tablet Place 0.4 mg under tongue every 5 minutes as needed for Chest Pain. Max of 3 tablets, call 911.   ? nystatin (NYSTOP) 100,000 unit/g topical powder Apply  topically to affected area four times daily.   ? pantoprazole DR (PROTONIX) 40 mg tablet TAKE (1) TABLET BY MOUTH TWO TIMES A DAY.   ? polyethylene glycol 3350 (MIRALAX) 17 g packet Take one packet by mouth as Needed.   ? sodium chloride 3 % nebulizer solution Inhale 4 mL by mouth into the lungs twice daily. Use with [place].  Indications: asthma, chronic cough, bronchiectasis   ? spironolactone (ALDACTONE) 25 mg tablet Take one tablet by mouth twice daily. Take with food.   ? tamsulosin (FLOMAX) 0.4 mg capsule Take one capsule by mouth daily.     Vitals:    04/08/19 1316   Height: 180.3 cm (71)   PainSc: Zero     Vitals:    04/08/19 1316   Height: 180.3 cm (71)   PainSc: Zero       Body mass index is 39.05 kg/m?[name]     Physical Exam  Constitutional:       Comments: He appears relaxed and comfortably breathing. He is wearing oxygen currently.    Pulmonary:      Comments: He has nonlabored breathing. I do not see any accessory muscle use.   Neurological:      Comments: At his baseline.   Psychiatric:         Mood and Affect: Mood and affect normal.         Labwork reviewed:  Lab Results   Component Value Date/Time    HGBA1C 6.3 (H) 12/05/2018 01:57 AM    HGBA1C 6.7 (H) 08/12/2018    HGBA1C 6.8 (H) 04/03/2017 04:39 AM    HGBPOC 11.2 (L) 10/25/2012 10:58 AM    HGBPOC 12.9 (L) 10/25/2012 08:55 AM    TSH 4.37 12/20/2018 08:09 PM    FREET4R 0.99 12/15/2014 09:12 AM    CHOL 106 12/05/2018 01:57 AM    TRIG 108 12/05/2018 01:57 AM    HDL 31 (L) 12/05/2018 01:57 AM    LDL 60 12/05/2018 01:57 AM    NA 140 02/17/2019    K 3.8 02/17/2019    CL 99 02/17/2019    CO2 28.9 02/17/2019    GAP 9 12/31/2018 12:04 PM    BUN 18 02/17/2019    CR 1.2 02/17/2019    GLU 110 02/17/2019    CA 8.7 02/17/2019    PO4 3.2 12/21/2018 10:34 PM    ALBUMIN 2.9 (L) 12/26/2018 05:19 AM    TOTPROT 5.7 (L) 12/26/2018 05:19 AM ALKPHOS 69 12/26/2018 05:19 AM    AST 22 12/26/2018 05:19 AM  ALT 38 12/26/2018 05:19 AM    TOTBILI 0.3 12/26/2018 05:19 AM    GFR 62 02/17/2019    GFRAA >60 12/31/2018 12:04 PM    PSA 2.96 11/25/2018              Assessment and Plan:  1. 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  2. Chronic bronchiectasis with an acute exacerbation.  3. Chronic respiratory failure with hypoxia and hypercapnia.     Today Ed appears to be having an acute exacerbation of his chronic bronchiectasis which is typical symptoms for him. He did start the azithromycin and prednisone that he had on hand and I encouraged him to continue a full 5 day course of that. He will call us if he is not improving. I also think he is having some underlying post acute COVID syndrome. He has not been able to get back to his pre-COVID baseline status on many levels. I talked to him about the expected course of this and continue to work with him over time to improve his symptoms and improve his quality of life the best that we can.     Total of 30 minutes were spent on the same day of the visit including preparing to see the patient, obtaining and/or reviewing separately obtained history, performing a medically appropriate examination and/or evaluation, counseling and educating the patient/family/caregiver, ordering medications, tests, or procedures, referring and communication with other health care professionals, documenting clinical information in the electronic or other health record, independently interpreting results and communicating results to the patient/family/caregiver, and care coordination.         There are no Patient Instructions on file for this visit.    Return in about 8 weeks (around 06/03/2019) for In Person OR Telehealth, First Available.     ATTESTATION  Documentation services were performed by [employer] after patient consented to recording for virtual documentation specialist and provider reviewed before signing. DAXMammie Lorenzo.

## 2019-04-08 NOTE — Progress Notes
Did patient read financial policy, consent to treat, and notice of privacy practices? Yes    Does the patient give verbal consent to each policy? Yes    Does the patient have any vitals to report? No    N/A Vitals charted in O2? N/A    Is the patient in pain? 0 = No pain    Screening questions completed? Yes    Is the patient able to acces the Mychart message with the start visit link? Yes    Is patient in "virtual waiting room" Yes    Maycel Riffe, RN

## 2019-04-15 ENCOUNTER — Encounter: Admit: 2019-04-15 | Discharge: 2019-04-15 | Payer: MEDICARE

## 2019-04-23 ENCOUNTER — Encounter: Admit: 2019-04-23 | Discharge: 2019-04-23 | Payer: MEDICARE

## 2019-04-23 ENCOUNTER — Ambulatory Visit: Admit: 2019-04-23 | Discharge: 2019-04-24 | Payer: MEDICARE

## 2019-04-23 DIAGNOSIS — Z95828 Presence of other vascular implants and grafts: Secondary | ICD-10-CM

## 2019-04-23 DIAGNOSIS — M961 Postlaminectomy syndrome, not elsewhere classified: Secondary | ICD-10-CM

## 2019-04-23 DIAGNOSIS — R0600 Dyspnea, unspecified: Secondary | ICD-10-CM

## 2019-04-23 DIAGNOSIS — M954 Acquired deformity of chest and rib: Secondary | ICD-10-CM

## 2019-04-23 DIAGNOSIS — E1165 Type 2 diabetes mellitus with hyperglycemia: Secondary | ICD-10-CM

## 2019-04-23 DIAGNOSIS — I714 Abdominal aortic aneurysm, without rupture: Secondary | ICD-10-CM

## 2019-04-23 DIAGNOSIS — J479 Bronchiectasis, uncomplicated: Secondary | ICD-10-CM

## 2019-04-23 DIAGNOSIS — I2510 Atherosclerotic heart disease of native coronary artery without angina pectoris: Secondary | ICD-10-CM

## 2019-04-23 DIAGNOSIS — I10 Essential (primary) hypertension: Secondary | ICD-10-CM

## 2019-04-23 DIAGNOSIS — J45909 Unspecified asthma, uncomplicated: Secondary | ICD-10-CM

## 2019-04-23 DIAGNOSIS — I429 Cardiomyopathy, unspecified: Secondary | ICD-10-CM

## 2019-04-23 DIAGNOSIS — J189 Pneumonia, unspecified organism: Secondary | ICD-10-CM

## 2019-04-23 DIAGNOSIS — R05 Cough: Secondary | ICD-10-CM

## 2019-04-23 DIAGNOSIS — R609 Edema, unspecified: Secondary | ICD-10-CM

## 2019-04-23 DIAGNOSIS — I5042 Chronic combined systolic (congestive) and diastolic (congestive) heart failure: Secondary | ICD-10-CM

## 2019-04-23 DIAGNOSIS — J449 Chronic obstructive pulmonary disease, unspecified: Secondary | ICD-10-CM

## 2019-04-23 DIAGNOSIS — J302 Other seasonal allergic rhinitis: Secondary | ICD-10-CM

## 2019-04-23 DIAGNOSIS — G4733 Obstructive sleep apnea (adult) (pediatric): Secondary | ICD-10-CM

## 2019-04-23 DIAGNOSIS — Z8614 Personal history of Methicillin resistant Staphylococcus aureus infection: Secondary | ICD-10-CM

## 2019-04-23 DIAGNOSIS — E782 Mixed hyperlipidemia: Secondary | ICD-10-CM

## 2019-04-23 DIAGNOSIS — K219 Gastro-esophageal reflux disease without esophagitis: Secondary | ICD-10-CM

## 2019-04-23 DIAGNOSIS — N183 CKD (chronic kidney disease) stage 3, GFR 30-59 ml/min (HCC): Secondary | ICD-10-CM

## 2019-04-23 DIAGNOSIS — R413 Other amnesia: Secondary | ICD-10-CM

## 2019-04-23 DIAGNOSIS — I509 Heart failure, unspecified: Secondary | ICD-10-CM

## 2019-04-23 DIAGNOSIS — Z9981 Dependence on supplemental oxygen: Secondary | ICD-10-CM

## 2019-04-23 DIAGNOSIS — J470 Bronchiectasis with acute lower respiratory infection: Secondary | ICD-10-CM

## 2019-04-23 DIAGNOSIS — R6889 Other general symptoms and signs: Secondary | ICD-10-CM

## 2019-04-23 DIAGNOSIS — Z8679 Personal history of other diseases of the circulatory system: Secondary | ICD-10-CM

## 2019-04-23 DIAGNOSIS — N401 Enlarged prostate with lower urinary tract symptoms: Secondary | ICD-10-CM

## 2019-04-23 DIAGNOSIS — L03116 Cellulitis of left lower limb: Secondary | ICD-10-CM

## 2019-04-23 MED ORDER — SPIRONOLACTONE 25 MG PO TAB
ORAL_TABLET | Freq: Two times a day (BID) | ORAL | 3 refills | 90.00000 days | Status: AC
Start: 2019-04-23 — End: ?

## 2019-04-24 DIAGNOSIS — Z1159 Encounter for screening for other viral diseases: Secondary | ICD-10-CM

## 2019-04-28 ENCOUNTER — Encounter: Admit: 2019-04-28 | Discharge: 2019-04-28 | Payer: MEDICARE

## 2019-04-28 DIAGNOSIS — E1165 Type 2 diabetes mellitus with hyperglycemia: Secondary | ICD-10-CM

## 2019-04-28 DIAGNOSIS — R413 Other amnesia: Secondary | ICD-10-CM

## 2019-04-28 LAB — COMPREHENSIVE METABOLIC PANEL
Lab: 0.6 (ref 0.2–1.0)
Lab: 1.4 — AB (ref 0.8–1.3)
Lab: 100 (ref 98–106)
Lab: 103 (ref 46–116)
Lab: 12 — AB (ref 16–63)
Lab: 139 (ref 137–150)
Lab: 147 — AB (ref 65–110)
Lab: 15 (ref 15–37)
Lab: 27 — AB (ref 7.0–18.0)
Lab: 3.5 (ref 3.5–5.2)
Lab: 32 (ref 21–32.0)
Lab: 4.6 (ref 3.6–5.2)
Lab: 52
Lab: 7.4 (ref 6.4–8.2)
Lab: 9.8 (ref 8.5–10.5)

## 2019-04-28 LAB — HEMOGLOBIN A1C: Lab: 6.2 — AB (ref 4.5–6.0)

## 2019-04-28 LAB — TSH WITH FREE T4 REFLEX: Lab: 2.7 (ref 0.34–4.82)

## 2019-04-28 NOTE — Progress Notes
Patient's wife Ruben Wood reached for routine CM follow up.   Chart reviewed.   Patient having ongoing issues with memory.   Dr, Comfort placed referral to neuropsychologist for evaluation.   This appointment has not been scheduled yet.   Wife has mentioned concerns about memory loss to CM previously, and it seems to be worse since covid infection.   Patient and wife both received 1st dose of covid vaccine.   Wife states she had significant reaction, but patient only had a sore arm.   They are scheduled for second dose.   Patient with recent bronchiectasis and lower respiratory infection.   He is receiving therapy.     Wife had to end call due to visitor at door.   No other CM needs.   Gave wife CM number to contact if other issues arise.

## 2019-04-29 ENCOUNTER — Encounter: Admit: 2019-04-29 | Discharge: 2019-04-29 | Payer: MEDICARE

## 2019-04-29 LAB — HEPATITIS C ANTIBODY W REFLEX HCV PCR QUANT

## 2019-04-29 LAB — MICROALB/CR RATIO-URINE RANDOM
Lab: 12
Lab: 63
Lab: 7.6

## 2019-04-29 LAB — VITAMIN B12: Lab: 294 (ref 232–1245)

## 2019-05-01 ENCOUNTER — Ambulatory Visit: Admit: 2019-05-01 | Discharge: 2019-05-01 | Payer: MEDICARE

## 2019-05-16 ENCOUNTER — Ambulatory Visit: Admit: 2019-05-16 | Discharge: 2019-05-16 | Payer: MEDICARE

## 2019-05-16 ENCOUNTER — Encounter: Admit: 2019-05-16 | Discharge: 2019-05-16 | Payer: MEDICARE

## 2019-05-16 DIAGNOSIS — I5042 Chronic combined systolic (congestive) and diastolic (congestive) heart failure: Secondary | ICD-10-CM

## 2019-05-16 MED ORDER — PERFLUTREN LIPID MICROSPHERES 1.1 MG/ML IV SUSP
1-20 mL | Freq: Once | INTRAVENOUS | 0 refills | Status: CP | PRN
Start: 2019-05-16 — End: ?
  Administered 2019-05-16: 20:00:00 3 mL via INTRAVENOUS

## 2019-05-16 MED ORDER — BUMETANIDE 2 MG PO TAB
2 mg | ORAL_TABLET | ORAL | 3 refills | Status: AC
Start: 2019-05-16 — End: ?

## 2019-05-16 NOTE — Progress Notes
Daily CardioMEMS Readings    Goal PA Diastolic: 11-13 mmHg   PA Diastolic Thresholds: 9-15 mmHg    Taken on PA Systolic PA Diastolic PA Mean Heart Rate   [id number], 08:32 AM 28 mmHg 10 mmHg 16 mmHg 111 bpm   05-14-2019, 09:02 AM 27 mmHg 10 mmHg 15 mmHg 105 bpm   05-13-2019, 08:58 AM 30 mmHg 11 mmHg 17 mmHg 92 bpm   05-10-2019, 03:52 PM 30 mmHg 10 mmHg 17 mmHg 99 bpm   05-09-2019, 07:45 AM 32 mmHg 11 mmHg 18 mmHg 93 bpm   05-08-2019, 08:25 AM 36 mmHg 12 mmHg 19 mmHg 85 bpm   05-07-2019, 04:52 PM 34 mmHg 11 mmHg 18 mmHg 92 bpm   05-06-2019, 03:21 PM 34 mmHg 13 mmHg 20 mmHg 94 bpm   05-05-2019, 08:45 AM 34 mmHg 13 mmHg 19 mmHg 81 bpm   05-03-2019, 03:03 PM 31 mmHg 9 mmHg 16 mmHg 87 bpm   05-02-2019, 12:03 PM 33 mmHg 12 mmHg 19 mmHg 93 bpm   05-01-2019, 08:50 AM 33 mmHg 11 mmHg 18 mmHg 86 bpm   04-30-2019, 03:56 PM 32 mmHg 12 mmHg 18 mmHg 99 bpm   04-29-2019, 09:07 AM 31 mmHg 13 mmHg 18 mmHg 81 bpm   04-28-2019, 08:32 AM 27 mmHg 9 mmHg 14 mmHg 95 bpm   04-27-2019, 04:35 PM 31 mmHg 10 mmHg 17 mmHg 98 bpm   04-26-2019, 04:34 PM 28 mmHg 9 mmHg 15 mmHg 94 bpm   04-25-2019, 07:09 AM 28 mmHg 10 mmHg 16 mmHg 88 bpm   04-24-2019, 08:48 AM 31 mmHg 11 mmHg 18 mmHg 95 bpm   04-23-2019, 09:30 AM 27 mmHg 10 mmHg 16 mmHg 98 bpm   04-22-2019, 09:29 AM 29 mmHg 9 mmHg 16 mmHg 93 bpm   04-21-2019, 08:59 AM 28 mmHg 9 mmHg 15 mmHg 93 bpm

## 2019-05-21 ENCOUNTER — Encounter: Admit: 2019-05-21 | Discharge: 2019-05-21 | Payer: MEDICARE

## 2019-05-21 ENCOUNTER — Ambulatory Visit: Admit: 2019-05-21 | Discharge: 2019-05-22 | Payer: MEDICARE

## 2019-05-21 DIAGNOSIS — I509 Heart failure, unspecified: Secondary | ICD-10-CM

## 2019-05-21 DIAGNOSIS — J479 Bronchiectasis, uncomplicated: Secondary | ICD-10-CM

## 2019-05-21 DIAGNOSIS — Z8614 Personal history of Methicillin resistant Staphylococcus aureus infection: Secondary | ICD-10-CM

## 2019-05-21 DIAGNOSIS — Z8679 Personal history of other diseases of the circulatory system: Secondary | ICD-10-CM

## 2019-05-21 DIAGNOSIS — Z95828 Presence of other vascular implants and grafts: Secondary | ICD-10-CM

## 2019-05-21 DIAGNOSIS — N401 Enlarged prostate with lower urinary tract symptoms: Secondary | ICD-10-CM

## 2019-05-21 DIAGNOSIS — N3281 Overactive bladder: Secondary | ICD-10-CM

## 2019-05-21 DIAGNOSIS — I714 Abdominal aortic aneurysm, without rupture: Secondary | ICD-10-CM

## 2019-05-21 DIAGNOSIS — N183 CKD (chronic kidney disease) stage 3, GFR 30-59 ml/min (HCC): Secondary | ICD-10-CM

## 2019-05-21 DIAGNOSIS — I5042 Chronic combined systolic (congestive) and diastolic (congestive) heart failure: Secondary | ICD-10-CM

## 2019-05-21 DIAGNOSIS — M954 Acquired deformity of chest and rib: Secondary | ICD-10-CM

## 2019-05-21 DIAGNOSIS — J449 Chronic obstructive pulmonary disease, unspecified: Secondary | ICD-10-CM

## 2019-05-21 DIAGNOSIS — K219 Gastro-esophageal reflux disease without esophagitis: Secondary | ICD-10-CM

## 2019-05-21 DIAGNOSIS — N4231 Prostatic intraepithelial neoplasia: Secondary | ICD-10-CM

## 2019-05-21 DIAGNOSIS — E782 Mixed hyperlipidemia: Secondary | ICD-10-CM

## 2019-05-21 DIAGNOSIS — J302 Other seasonal allergic rhinitis: Secondary | ICD-10-CM

## 2019-05-21 DIAGNOSIS — I429 Cardiomyopathy, unspecified: Secondary | ICD-10-CM

## 2019-05-21 DIAGNOSIS — M961 Postlaminectomy syndrome, not elsewhere classified: Secondary | ICD-10-CM

## 2019-05-21 DIAGNOSIS — I2510 Atherosclerotic heart disease of native coronary artery without angina pectoris: Secondary | ICD-10-CM

## 2019-05-21 DIAGNOSIS — G4733 Obstructive sleep apnea (adult) (pediatric): Secondary | ICD-10-CM

## 2019-05-21 DIAGNOSIS — L03116 Cellulitis of left lower limb: Secondary | ICD-10-CM

## 2019-05-21 DIAGNOSIS — I10 Essential (primary) hypertension: Secondary | ICD-10-CM

## 2019-05-21 DIAGNOSIS — J189 Pneumonia, unspecified organism: Secondary | ICD-10-CM

## 2019-05-21 DIAGNOSIS — R609 Edema, unspecified: Secondary | ICD-10-CM

## 2019-05-21 DIAGNOSIS — Z9981 Dependence on supplemental oxygen: Secondary | ICD-10-CM

## 2019-05-21 DIAGNOSIS — J45909 Unspecified asthma, uncomplicated: Secondary | ICD-10-CM

## 2019-05-21 DIAGNOSIS — R0600 Dyspnea, unspecified: Secondary | ICD-10-CM

## 2019-05-21 DIAGNOSIS — Z87898 Personal history of other specified conditions: Secondary | ICD-10-CM

## 2019-05-21 DIAGNOSIS — R05 Cough: Secondary | ICD-10-CM

## 2019-05-21 NOTE — Assessment & Plan Note
Last PSA (Oct 2020) lower than prior results, c/w 5-ARI use.  Recommend hold off on further routine prostate cancer early detection/ screening unless clinically indicated.

## 2019-05-21 NOTE — Progress Notes
pvr 13ml

## 2019-05-21 NOTE — Assessment & Plan Note
See elevated PSA A&P note.

## 2019-05-21 NOTE — Progress Notes
Date of Service: 05/21/2019       Subjective:             Ruben Gladd. is a 79 y.o. male.      Chief Complaint   Patient presents with   ? Enlarged Prostrate - Bph         History of Present Illness  Very pleasant Caucasian gentleman w/ multiple medical & CV co-morbidities, w/ BPH/ LUTS & elevated PSA.  PSA hx detailed below.  Previously followed by Dr. Oswaldo Milian.    (+)BPH/ LUTS.  TRUS Prostate (03/01/2017): Prostate volume = 38.3 mL.  -- taking Tamsulosin.  -- started Finasteride ~ Oct. 23, 2019.    (+)h/o acute urinary retention during inpt rehab following fall & (R) shoulder dislocation ~ 11/14/2017.  Passed voiding trial on 12/17/2017.    (+)OAB, including urianry frequency, urgency, urge urinary incontinence (UUI), nocturnal enuresis, & nocturia.  -- Mirabegron 25 mg --> (+)improved, but still bothersome OAB sx's.  -- Mirabegron 50 mg --> no longer taking this med.    (+)taking diuretic.  (+)exacerbates OAB sx's, but he states sx's are manageable at this time.    (+)elevated PSA.  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    Currently, pt states he is voiding well w/o obstructive LUTS.    Presents to [facility]rology Clinic today for routine f/u.           Review of Systems      Objective:         ? acetaminophen (TYLENOL) 325 mg tablet Take two tablets by mouth every 6 hours as needed for Pain.   ? albuterol 0.083% (PROVENTIL) 2.5 mg /3 mL (0.083 %) nebulizer solution Inhale 3 mL solution by nebulizer as directed every 6 hours as needed for Wheezing or Shortness of Breath. Indications: asthma   ? albuterol sulfate (PROAIR HFA) 90 mcg/actuation aerosol inhaler Inhale two puffs by mouth into the lungs every 4 hours as needed for Wheezing or Shortness of Breath.   ? allopurinoL (ZYLOPRIM) 300 mg tablet TAKE (1) TABLET BY MOUTH ONCE DAILY WITH FOOD.   ? AMITR/GABAPEN/EMU OIL 05/24/08% CREAM (COMPOUND) Apply 5 grams (10 pumps) topically to affected area three times daily.   ? Ammonium Lactate-Emu Oil (EMU-LAC) 10 % crea Apply  topically to affected area. Apply to feet    ? ascorbic acid (VITAMIN C) 500 mg tablet Take one tablet by mouth daily.   ? aspirin EC 81 mg tablet Take one tablet by mouth daily. Take with food.   ? atorvastatin (LIPITOR) 40 mg tablet TAKE (1) TABLET BY MOUTH ONCE DAILY   ? azithromycin (ZITHROMAX) 250 mg tablet Z-PAK: Take 2 tabs by mouth on day 1, followed by 1 tab by mouth daily on days 2-5.   ? bacitracin 500 unit/g topical ointment Apply to affected area TID   ? benzonatate (TESSALON PERLES) 100 mg capsule Take two capsules by mouth every 8 hours as needed for Cough.   ? budesonide-formoterol (SYMBICORT HFA) 160-4.5 mcg/actuation inhalation Inhale two puffs by mouth into the lungs twice daily. Rinse mouth after use.   ? bumetanide (BUMEX) 2 mg tablet Take one tablet by mouth every 48 hours. Take extra dose for swelling   ? cholecalciferol (VITAMIN D-3) 1,000 units tablet Take 1,000 Units by mouth daily.   ? docusate (COLACE) 100 mg capsule Take 100 mg by mouth daily as needed for Constipation.   ? doxycycline (MONODOX) 100  mg capsule Take one capsule by mouth daily.   ? duloxetine DR (CYMBALTA) 60 mg capsule Take one capsule by mouth daily.   ? empagliflozin (JARDIANCE) 10 mg tablet Take one tablet by mouth daily.   ? finasteride (PROSCAR) 5 mg tablet TAKE (1) TABLET BY MOUTH ONCE DAILY   ? fish oil- omega 3-DHA/EPA 300/1,000 mg capsule Take 1 capsule by mouth daily.   ? gabapentin (NEURONTIN) 400 mg capsule Take 1 capsule by mouth three times daily as needed.   ? ipratropium (ATROVENT) 0.03 % nasal spray Apply two sprays to each nostril as directed every 12 hours.   ? Miscellaneous Medical Supply misc Rx: Please wrap legs with ACE bandage from toes as high up to the leg as possible bilaterally  Dx: Lymphedema   ? montelukast (SINGULAIR) 10 mg tablet Take one tablet by mouth at bedtime daily.   ? nitroglycerin (NITROSTAT) 0.4 mg tablet Place 0.4 mg under tongue every 5 minutes as needed for Chest Pain. Max of 3 tablets, call 911.   ? nystatin (NYSTOP) 100,000 unit/g topical powder Apply  topically to affected area four times daily.   ? pantoprazole DR (PROTONIX) 40 mg tablet TAKE (1) TABLET BY MOUTH TWO TIMES A DAY.   ? polyethylene glycol 3350 (MIRALAX) 17 g packet Take one packet by mouth as Needed.   ? sodium chloride 3 % nebulizer solution Inhale 4 mL by mouth into the lungs twice daily. Use with [place].  Indications: asthma, chronic cough, bronchiectasis   ? spironolactone (ALDACTONE) 25 mg tablet Take one tablet by mouth twice daily with food.   ? tamsulosin (FLOMAX) 0.4 mg capsule Take one capsule by mouth daily.         Vitals:    05/21/19 1036   Temp: 36.3 ?C (97.3 ?F)   Weight: 123.8 kg (273 lb)   Height: 180.3 cm (71)   PainSc: Zero       Body mass index is 38.08 kg/m?[name]       Physical Exam   Constitutional: He is oriented to person, place, and time. He appears well-developed and well-nourished. No distress.   HENT:   Head: Normocephalic and atraumatic.   Pulmonary/Chest: Effort normal. No respiratory distress.   Abdominal: He exhibits no distension.   Obese.   Genitourinary:    Testes and penis normal.   Right testis shows no mass, no swelling and no tenderness. Right testis is descended. Left testis shows no mass, no swelling and no tenderness. Left testis is descended. Circumcised. No hypospadias, penile erythema or penile tenderness. No discharge found.   Lymphadenopathy:        Right: No inguinal adenopathy present.        Left: No inguinal adenopathy present.   Neurological: He is alert and oriented to person, place, and time.   Skin: Skin is warm and dry. No erythema. No pallor.   Psychiatric: He has a normal mood and affect. His behavior is normal.   Vitals reviewed.             [place] PVR = 13 mL.        Labs:  Christiana PSA Hx:  Lab Results   Component Value Date    PSA 2.96 11/25/2018    PSA 11.60 (H) 04/23/2018    PSA 10.36 (H) 08/28/2017    PSA 9.11 (H) 01/22/2017           Assessment and Plan:    Problem   Oab (Overactive Bladder)  Urinary frequency, urgency, urge urinary incontinence (UUI), nocturia, nocturnal enuresis.  -- trial Mirabegron 25 mg --> improved, but persistent sx's.  -- trial Mirabegron 50 mg.     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     Bph With Obstruction/Lower Urinary Tract Symptoms    TRUS Prostate (03/01/2017): Prostate volume = 38.3 mL.  D/c'd Tamsulosin d/t lack of symptom improvement.     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           BPH with obstruction/lower urinary tract symptoms  Continue Tamsulosin 0.4 mg q day.  Continue Finasteride 5 mg q day.  RTC 1 yr, repeat bladder scan.    History of elevated prostate specific antigen (PSA)  Last PSA (Oct 2020) lower than prior results, c/w 5-ARI use.  Recommend hold off on further routine prostate cancer early detection/ screening unless clinically indicated.    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    OAB (overactive bladder)  No longer on Mirabegron.  OAB sx's not bothersome enough at this time to re-start med or other management options.               Marin Roberts, PA-C  Urology

## 2019-05-21 NOTE — Assessment & Plan Note
Continue Tamsulosin 0.4 mg q day.  Continue Finasteride 5 mg q day.  RTC 1 yr, repeat bladder scan.

## 2019-05-21 NOTE — Assessment & Plan Note
No longer on Mirabegron.  OAB sx's not bothersome enough at this time to re-start med or other management options.

## 2019-06-02 ENCOUNTER — Ambulatory Visit: Admit: 2019-06-02 | Discharge: 2019-06-02 | Payer: MEDICARE

## 2019-06-02 DIAGNOSIS — I5042 Chronic combined systolic (congestive) and diastolic (congestive) heart failure: Secondary | ICD-10-CM

## 2019-06-12 MED ORDER — POTASSIUM CHLORIDE 20 MEQ PO TBTQ
ORAL_TABLET | Freq: Every day | 0 refills
Start: 2019-06-12 — End: ?

## 2019-06-27 ENCOUNTER — Encounter: Admit: 2019-06-27 | Discharge: 2019-06-27 | Payer: MEDICARE

## 2019-06-27 DIAGNOSIS — J4540 Moderate persistent asthma, uncomplicated: Secondary | ICD-10-CM

## 2019-06-27 DIAGNOSIS — K219 Gastro-esophageal reflux disease without esophagitis: Secondary | ICD-10-CM

## 2019-06-27 DIAGNOSIS — J4541 Moderate persistent asthma with (acute) exacerbation: Secondary | ICD-10-CM

## 2019-06-27 MED ORDER — ATORVASTATIN 40 MG PO TAB
ORAL_TABLET | Freq: Every day | 1 refills | Status: AC
Start: 2019-06-27 — End: ?

## 2019-06-27 MED ORDER — PANTOPRAZOLE 40 MG PO TBEC
ORAL_TABLET | Freq: Two times a day (BID) | ORAL | 5 refills | 90.00000 days | Status: AC
Start: 2019-06-27 — End: ?

## 2019-06-27 NOTE — Telephone Encounter
Pharmacy requesting:Lipitor  Liver Function Panel  Lab Results   Component Value Date/Time    TOTBILI 0.6 04/28/2019    DBILI 0.3 11/04/2014 04:29 AM    ALBUMIN 3.5 04/28/2019    ALKPHOS 103 04/28/2019    AST 15 04/28/2019    ALT 12 (A) 04/28/2019    TOTPROT 7.4 04/28/2019     LR: 04/24/2018  LOV: 04/23/2019  This is on protocol however last ALT is out of range.  Routing to Dr. Crecencio Mc to approve  Etta Grandchild, RN

## 2019-06-27 NOTE — Telephone Encounter
LOV 03/14/2019  Next OV 09/12/2019.    Pt to continue Reflux medication as he is stable on medication.    Ok to refill per protocol.

## 2019-07-03 ENCOUNTER — Ambulatory Visit: Admit: 2019-07-03 | Discharge: 2019-07-03 | Payer: MEDICARE

## 2019-07-03 DIAGNOSIS — I5042 Chronic combined systolic (congestive) and diastolic (congestive) heart failure: Secondary | ICD-10-CM

## 2019-07-24 ENCOUNTER — Encounter: Admit: 2019-07-24 | Discharge: 2019-07-24 | Payer: MEDICARE

## 2019-07-24 MED ORDER — SULFAMETHOXAZOLE-TRIMETHOPRIM 800-160 MG PO TAB
ORAL_TABLET | Freq: Two times a day (BID) | 0 refills
Start: 2019-07-24 — End: ?

## 2019-07-24 NOTE — Telephone Encounter
Nurse received a voicemail from patients wife requesting a refill on his antibiotic.    Per wife, provider wrote script for patient to have on hand in case of respiratory  flare up and he had one recently.    Wife stated the refill request has sent before but refused.    Routing to provider for review.    Olevia Bowens, RN

## 2019-07-24 NOTE — Telephone Encounter
Pharmacy requesting:Sulfamethoxazole     There is no current record of patient prescribed this medication from Dr. Crecencio Mc, or that they take it. Per protocol the medication refill request will be denied.    Vilinda Boehringer, RN

## 2019-07-25 MED ORDER — AZITHROMYCIN 250 MG PO TAB
ORAL_TABLET | Freq: Every day | 0 refills | Status: DC
Start: 2019-07-25 — End: 2019-07-28

## 2019-07-28 ENCOUNTER — Ambulatory Visit: Admit: 2019-07-28 | Discharge: 2019-07-29 | Payer: MEDICARE

## 2019-07-28 ENCOUNTER — Encounter: Admit: 2019-07-28 | Discharge: 2019-07-28 | Payer: MEDICARE

## 2019-07-28 DIAGNOSIS — D801 Nonfamilial hypogammaglobulinemia: Secondary | ICD-10-CM

## 2019-07-28 DIAGNOSIS — Z79899 Other long term (current) drug therapy: Secondary | ICD-10-CM

## 2019-07-28 DIAGNOSIS — E1165 Type 2 diabetes mellitus with hyperglycemia: Secondary | ICD-10-CM

## 2019-07-28 DIAGNOSIS — B999 Unspecified infectious disease: Secondary | ICD-10-CM

## 2019-07-28 DIAGNOSIS — J209 Acute bronchitis, unspecified: Secondary | ICD-10-CM

## 2019-07-28 DIAGNOSIS — J439 Emphysema, unspecified: Secondary | ICD-10-CM

## 2019-07-28 DIAGNOSIS — N401 Enlarged prostate with lower urinary tract symptoms: Secondary | ICD-10-CM

## 2019-07-28 DIAGNOSIS — J9612 Chronic respiratory failure with hypercapnia: Secondary | ICD-10-CM

## 2019-07-28 MED ORDER — AZITHROMYCIN 250 MG PO TAB
ORAL_TABLET | Freq: Every day | 1 refills | Status: DC
Start: 2019-07-28 — End: 2019-08-28

## 2019-07-28 NOTE — Progress Notes
Did patient read financial policy, consent to treat, and notice of privacy practices? Yes    Does the patient give verbal consent to each policy? Yes    Does the patient have any vitals to report? No    N/A Vitals charted in O2? N/A    Is the patient in pain? 0 = No pain    Screening questions completed? Yes    Is the patient able to acces the Mychart message with the start visit link? Yes    Is patient in virtual waiting room Yes  Etta Grandchild, RN

## 2019-07-28 NOTE — Progress Notes
History of Present Illness  Ruben Wood. is a 79 y.o. male     Obtained patient's verbal consent to treat them and their agreement to [facility] financial policy and NPP via this telehealth visit during the [facility] Emergency    Ed has chronic bronchiectasis and seems to be having a flare of this today.  He is having increased cough and congestion from his baseline.  Typically during this he will take a course of azithromycin and that helps.  Sometimes he needs steroids.  He is on chronic antibiotic suppressive therapy with doxycycline.  He has been using his inhalers.  He denies any chest pain today.    His blood sugar levels have been doing well.  This seems to be mostly steroid-induced.  He is on Jardiance, but this is mostly for his heart failure.  He denies any significant swelling in his feet or ankles so long as he takes his water pill every day.  He denies any chest pain as mentioned above.    We went through all of his medications in detail today.  He does have polypharmacy and overall would like to get him on less medications in total.  We decided that we will stop finasteride and allopurinol today.  Is not had any gout attacks and that diagnosis is in question anyway.  His urinary symptoms have been well controlled.  He does take tamsulosin and finasteride currently, but will stop the finasteride only to start.       Review of Systems  Per HPI    Objective:         ? acetaminophen (TYLENOL) 325 mg tablet Take two tablets by mouth every 6 hours as needed for Pain.   ? albuterol 0.083% (PROVENTIL) 2.5 mg /3 mL (0.083 %) nebulizer solution Inhale 3 mL solution by nebulizer as directed every 6 hours as needed for Wheezing or Shortness of Breath. Indications: asthma   ? albuterol sulfate (PROAIR HFA) 90 mcg/actuation aerosol inhaler Inhale two puffs by mouth into the lungs every 4 hours as needed for Wheezing or Shortness of Breath.   ? aspirin EC 81 mg tablet Take one tablet by mouth daily. Take with food.   ? atorvastatin (LIPITOR) 40 mg tablet TAKE (1) TABLET BY MOUTH ONCE DAILY   ? azithromycin (ZITHROMAX) 250 mg tablet Z-PAK: Take 2 tabs by mouth on day 1, followed by 1 tab by mouth daily on days 2-5.   ? budesonide-formoterol (SYMBICORT HFA) 160-4.5 mcg/actuation inhalation Inhale two puffs by mouth into the lungs twice daily. Rinse mouth after use.   ? bumetanide (BUMEX) 2 mg tablet Take one tablet by mouth every 48 hours. Take extra dose for swelling   ? cholecalciferol (VITAMIN D-3) 1,000 units tablet Take 1,000 Units by mouth daily.   ? doxycycline (MONODOX) 100 mg capsule Take one capsule by mouth daily.   ? duloxetine DR (CYMBALTA) 60 mg capsule Take one capsule by mouth daily.   ? empagliflozin (JARDIANCE) 10 mg tablet Take one tablet by mouth daily.   ? fish oil- omega 3-DHA/EPA 300/1,000 mg capsule Take 1 capsule by mouth daily.   ? gabapentin (NEURONTIN) 400 mg capsule Take 1 capsule by mouth three times daily as needed.   ? ipratropium (ATROVENT) 0.03 % nasal spray Apply two sprays to each nostril as directed every 12 hours.   ? Miscellaneous Medical Supply misc Rx: Please wrap legs with ACE bandage from toes as high up to the leg as  possible bilaterally  Dx: Lymphedema   ? montelukast (SINGULAIR) 10 mg tablet Take one tablet by mouth at bedtime daily.   ? nitroglycerin (NITROSTAT) 0.4 mg tablet Place 0.4 mg under tongue every 5 minutes as needed for Chest Pain. Max of 3 tablets, call 911.   ? pantoprazole DR (PROTONIX) 40 mg tablet TAKE (1) TABLET BY MOUTH TWO TIMES A DAY.   ? sodium chloride 3 % nebulizer solution Inhale 4 mL by mouth into the lungs twice daily. Use with [place].  Indications: asthma, chronic cough, bronchiectasis   ? spironolactone (ALDACTONE) 25 mg tablet Take one tablet by mouth twice daily with food.   ? tamsulosin (FLOMAX) 0.4 mg capsule Take one capsule by mouth daily.     Vitals:    07/28/19 1318   PainSc: Zero     Vitals:    07/28/19 1318   PainSc: Zero       There is no height or weight on file to calculate BMI.     Physical Exam  Constitutional:       Comments: Frail   HENT:      Head: Normocephalic and atraumatic.   Pulmonary:      Effort: Pulmonary effort is normal.   Neurological:      General: No focal deficit present.      Mental Status: He is alert.   Psychiatric:         Mood and Affect: Mood normal.         Labwork reviewed:  Lab Results   Component Value Date/Time    HGBA1C 6.2 (A) 04/28/2019 12:00 AM    HGBA1C 6.3 (H) 12/05/2018 01:57 AM    HGBA1C 6.7 (H) 08/12/2018 12:00 AM    HGBPOC 11.2 (L) 10/25/2012 10:58 AM    HGBPOC 12.9 (L) 10/25/2012 08:55 AM    MCALBR 7.6 04/29/2019 12:00 AM    TSH 2.75 04/28/2019 12:00 AM    FREET4R 0.99 12/15/2014 09:12 AM    CHOL 106 12/05/2018 01:57 AM    TRIG 108 12/05/2018 01:57 AM    HDL 31 (L) 12/05/2018 01:57 AM    LDL 60 12/05/2018 01:57 AM    NA 139 04/28/2019 12:00 AM    K 4.6 04/28/2019 12:00 AM    CL 100 04/28/2019 12:00 AM    CO2 32.0 04/28/2019 12:00 AM    GAP 9 12/31/2018 12:04 PM    BUN 27.0 (A) 04/28/2019 12:00 AM    CR 1.4 (A) 04/28/2019 12:00 AM    GLU 147 (A) 04/28/2019 12:00 AM    CA 9.8 04/28/2019 12:00 AM    PO4 3.2 12/21/2018 10:34 PM    ALBUMIN 3.5 04/28/2019 12:00 AM    TOTPROT 7.4 04/28/2019 12:00 AM    ALKPHOS 103 04/28/2019 12:00 AM    AST 15 04/28/2019 12:00 AM    ALT 12 (A) 04/28/2019 12:00 AM    TOTBILI 0.6 04/28/2019 12:00 AM    GFR 52 04/28/2019 12:00 AM    GFRAA >60 12/31/2018 12:04 PM    PSA 2.96 11/25/2018 12:00 AM              Assessment and Plan:    1. Chronic bronchitis with acute exacerbation (HCC)    2. Mixed restrictive and obstructive lung disease (HCC)    3. Type 2 diabetes mellitus with hyperglycemia, without long-term current use of insulin (HCC)    4. Recurrent infections    5. Chronic respiratory failure with hypercapnia (HCC)    6.  Hypogammaglobulinemia (HCC)    7. BPH with obstruction/lower urinary tract symptoms    8. Polypharmacy      Today, he is having an exacerbation of his chronic bronchitis.  I will give him azithromycin.  Once he is through that course, he will continue back on his daily doxycycline antibiotic suppressive regimen.  We will try to avoid steroids if possible.  He will continue to use his oxygen as needed.  He does follow closely with Dr. Barbette Or as well as Dr. Lambert Mody.    Today we reviewed all his medications in detail.  Over time we will try to reduce his medications to the bare minimum.  He will stop finasteride and allopurinol today.  We will continue to monitor his symptoms closely.    There are no Patient Instructions on file for this visit.    Return in about 3 months (around 10/28/2019) for 40 minute appointment, Schedule for Medicare Wellness.

## 2019-07-29 DIAGNOSIS — J42 Unspecified chronic bronchitis: Secondary | ICD-10-CM

## 2019-08-04 ENCOUNTER — Encounter: Admit: 2019-08-04 | Discharge: 2019-08-04 | Payer: MEDICARE

## 2019-08-04 ENCOUNTER — Ambulatory Visit: Admit: 2019-08-04 | Discharge: 2019-08-04 | Payer: MEDICARE

## 2019-08-04 DIAGNOSIS — I5042 Chronic combined systolic (congestive) and diastolic (congestive) heart failure: Secondary | ICD-10-CM

## 2019-08-04 NOTE — Telephone Encounter
Ruben Wood PT with [facility] called to let Dr. Crecencio Mc know that they are preparing to d/c him in the next month.  He has been getting outpatient therapy with them since January and they feel he now needs to transition towards home exercise program.  Asking if Dr. Crecencio Mc has any concerns with this.    Routing to Dr. Crecencio Mc to advise    Etta Grandchild, RN

## 2019-08-05 NOTE — Telephone Encounter
I think that sounds good. We will re consult if we start backsliding.

## 2019-08-27 ENCOUNTER — Ambulatory Visit: Admit: 2019-08-27 | Discharge: 2019-08-28 | Payer: MEDICARE

## 2019-08-27 ENCOUNTER — Encounter: Admit: 2019-08-27 | Discharge: 2019-08-27 | Payer: MEDICARE

## 2019-08-27 MED ORDER — FINASTERIDE 5 MG PO TAB
5 mg | ORAL_TABLET | Freq: Every day | ORAL | 3 refills | Status: AC
Start: 2019-08-27 — End: ?

## 2019-08-27 NOTE — Telephone Encounter
Finasteride refill per protocol- Seen 05/03/19 continue Finasteride for BPH with obstruction/lower urinary tract symptoms.

## 2019-08-28 ENCOUNTER — Encounter: Admit: 2019-08-28 | Discharge: 2019-08-28 | Payer: MEDICARE

## 2019-08-28 DIAGNOSIS — R413 Other amnesia: Secondary | ICD-10-CM

## 2019-08-28 DIAGNOSIS — J209 Acute bronchitis, unspecified: Secondary | ICD-10-CM

## 2019-08-28 MED ORDER — AZITHROMYCIN 250 MG PO TAB
ORAL_TABLET | Freq: Every day | 1 refills | Status: AC
Start: 2019-08-28 — End: ?

## 2019-08-28 MED ORDER — PREDNISONE 20 MG PO TAB
40 mg | ORAL_TABLET | Freq: Every day | ORAL | 0 refills | Status: DC
Start: 2019-08-28 — End: 2019-09-02

## 2019-08-28 NOTE — Telephone Encounter
I sent in our usual first step: prednisone and azithromycin.

## 2019-08-28 NOTE — Telephone Encounter
Pt was informed of medications being sent to pharmacy. Patient verbalized understanding. Patient denied having any questions.    David Stall, RN

## 2019-08-28 NOTE — Telephone Encounter
Pt's wife lvm on nurse line. Pt c/o a deep cough that started a few days ago.    RN spoke to pt and his wife. Pt reports a productive cough with yellow sputum occasionally, watery eyes, runny nose. Patient denies having a fever and reports he is covid vaccinated. Patient reports that he is short of breath but he says that is baseline for him.  Pt said he is out of all antibiotics and was wondering if Dr. Crecencio Mc would send something to his pharmacy.  RN offered appt to pt but pt declined as they have prior engagements.     Routing to PCP for review.    David Stall, RN

## 2019-09-01 ENCOUNTER — Encounter: Admit: 2019-09-01 | Discharge: 2019-09-01 | Payer: MEDICARE

## 2019-09-01 NOTE — Telephone Encounter
Patients wife called stating patient has been using prednisone and antibiotic and is not doing any better.    Asking Dr. Crecencio Mc to advise  Etta Grandchild, RN

## 2019-09-01 NOTE — Telephone Encounter
I think we need an in person visit then to assess things.  Sometimes he goes locally to get an exam, xray, and labs. That might be easier. If needed, we can doublebook tomorrow.

## 2019-09-01 NOTE — Telephone Encounter
I called LVM offering patient to be seen tomorrow @ 8:40 am or 9:40 am by Dr. Crecencio Mc.  Otherwise recommended that he be seen locally if they prefer.  Asked her to call me back and leave me a message so I could schedule the appointment first thing tomorrow morning.    Etta Grandchild, RN

## 2019-09-02 ENCOUNTER — Encounter: Admit: 2019-09-02 | Discharge: 2019-09-02 | Payer: MEDICARE

## 2019-09-02 ENCOUNTER — Ambulatory Visit: Admit: 2019-09-02 | Discharge: 2019-09-02 | Payer: MEDICARE

## 2019-09-02 ENCOUNTER — Ambulatory Visit: Admit: 2019-09-02 | Discharge: 2019-09-03 | Payer: MEDICARE

## 2019-09-02 DIAGNOSIS — E1165 Type 2 diabetes mellitus with hyperglycemia: Secondary | ICD-10-CM

## 2019-09-02 DIAGNOSIS — J470 Bronchiectasis with acute lower respiratory infection: Secondary | ICD-10-CM

## 2019-09-02 DIAGNOSIS — I5042 Chronic combined systolic (congestive) and diastolic (congestive) heart failure: Secondary | ICD-10-CM

## 2019-09-02 LAB — COMPREHENSIVE METABOLIC PANEL
Lab: 0.8 mg/dL — ABNORMAL HIGH (ref 0.3–1.2)
Lab: 1.3 mg/dL — ABNORMAL HIGH (ref 0.4–1.24)
Lab: 102 MMOL/L — ABNORMAL HIGH (ref 98–110)
Lab: 11 10*3/uL — ABNORMAL LOW (ref 3–12)
Lab: 141 MMOL/L (ref 137–147)
Lab: 149 mg/dL — ABNORMAL HIGH (ref 70–100)
Lab: 20 U/L (ref 7–40)
Lab: 21 U/L (ref 7–56)
Lab: 28 MMOL/L (ref 21–30)
Lab: 34 mg/dL — ABNORMAL HIGH (ref 7–25)
Lab: 4.3 g/dL — ABNORMAL LOW (ref 3.5–5.0)
Lab: 50 mL/min — ABNORMAL LOW (ref >60)
Lab: 7.1 g/dL (ref 6.0–8.0)
Lab: 81 U/L (ref 25–110)
Lab: 9.4 mg/dL (ref 8.5–10.6)
Lab: >60 mL/min (ref >60)

## 2019-09-02 LAB — HEMOGLOBIN A1C: Lab: 5.9 % (ref 4.0–6.0)

## 2019-09-02 LAB — CBC AND DIFF
Lab: 0 10*3/uL (ref 0–0.20)
Lab: 7.3 10*3/uL (ref 4.5–11.0)

## 2019-09-02 LAB — BNP (B-TYPE NATRIURETIC PEPTI): Lab: 196 pg/mL — ABNORMAL HIGH (ref 0–100)

## 2019-09-02 MED ORDER — PREDNISONE 20 MG PO TAB
ORAL_TABLET | Freq: Every day | ORAL | 0 refills | Status: CN
Start: 2019-09-02 — End: ?

## 2019-09-02 MED ORDER — LEVOFLOXACIN 750 MG PO TAB
750 mg | ORAL_TABLET | Freq: Every day | ORAL | 0 refills | 7.00000 days | Status: AC
Start: 2019-09-02 — End: ?

## 2019-09-02 MED ORDER — PREDNISONE 5 MG PO TAB
5 mg | ORAL_TABLET | Freq: Every day | ORAL | 0 refills | Status: CN
Start: 2019-09-02 — End: ?

## 2019-09-02 MED ORDER — PREDNISONE 20 MG PO TAB
40 mg | ORAL_TABLET | Freq: Every day | ORAL | 0 refills | Status: AC
Start: 2019-09-02 — End: ?

## 2019-09-02 NOTE — Patient Instructions
Future Appointments   Date Time Provider Department Center   09/02/2019  1:15 PM GENERAL ROOM [facility] Radiolog   09/04/2019 12:00 AM MAC REMOTE MONITORING MACREMOTEHRM CVM Procedur   09/12/2019 11:00 AM Deveron Furlong, MD Q[facility]APULM IM   09/17/2019  2:00 PM Spresser, Lynnell Grain, PhD [facility] IM   10/06/2019 12:00 AM MAC REMOTE MONITORING MACREMOTEHRM CVM Procedur   11/06/2019 12:00 AM MAC REMOTE MONITORING MACREMOTEHRM CVM Procedur   11/20/2019  1:30 PM Vanetta Shawl I, MD MACSTAVECL CVM Exam   11/26/2019 10:40 AM Kelvin Burpee, [name], MD [facility] IM   12/08/2019 12:00 AM MAC REMOTE MONITORING MACREMOTEHRM CVM Procedur   01/08/2020 12:00 AM MAC REMOTE MONITORING MACREMOTEHRM CVM Procedur   02/09/2020 12:00 AM MAC REMOTE MONITORING MACREMOTEHRM CVM Procedur   03/11/2020 12:00 AM MAC REMOTE MONITORING MACREMOTEHRM CVM Procedur   04/12/2020 12:00 AM MAC REMOTE MONITORING MACREMOTEHRM CVM Procedur   05/13/2020 12:00 AM MAC REMOTE MONITORING MACREMOTEHRM CVM Procedur   05/19/2020 11:15 AM [name], Lenice Llamas, PA-C [facility]   06/14/2020 12:00 AM MAC REMOTE MONITORING MACREMOTEHRM CVM Procedur       If you are on Mychart, you will now be able to read your clinic note from me.  My hope is that this will improve your engagement and insight into your health care and improve the accuracy of your medical record.  If you find inaccuracies, I apologize.  Please bring any concerns or inaccuracies to your next appointment.  Please remember that medical language and documentation is very different from how you and I speak and our dictation software is not perfect.    General Instructions:  ? How to reach me:   Please send a MyChart message to the General Medicine clinic or call Wynona Canes at [phone].    ? How to get a medication refill:  Please use the MyChart Refill request or contact your pharmacy directly to request medication refills. Please allow 48 hours.     ? How to receive your test results:  If you have signed up for MyChart, you will receive your test results and messages from me this way.  Otherwise, you will get a phone call or letter.   If you are expecting results and have not heard from my office within 2 weeks of your testing, please send a MyChart message or call my office.     ? Scheduling:  Our Scheduling phone number is [phone].  Same Day appointments are usually available. Please ask for an annual or yearly physical appointment if it has been over 1 year since our last appointment.  ? Appointment Reminders on your cell phone: Make sure we have your cell phone number, and Text Henry to [phone].  ? Support groups for many chronic illnesses are available through [facility]: [email or web address] or [phone].

## 2019-09-02 NOTE — Progress Notes
History of Present Illness  Ruben Wood. is a 79 y.o. male     [name] is here today as an urgent add on.  He has been having increased cough and mucus production as well as some intermittent shortness of breath.  He does have known bronchiectasis and has frequent flares of this.  He is on antibiotic suppressive therapy with doxycycline.  He started having increased mucus production and cough about a week ago.  I put him on a course of azithromycin and prednisone burst.  He notes that this has not really helped as much as it has in the past.  He does not have shortness of breath all the time, but at times he will feel like he gets mucus stuck and then it will pop and then he will build to breathe more easily.  He denies any chest pain or pressure heaviness.  Is not noticed any lower extremity swelling.  He denies any fevers or chills.  He has been using his vest as well as the flutter valve regularly.         Review of Systems  Per HPI    Objective:         ? acetaminophen (TYLENOL) 325 mg tablet Take two tablets by mouth every 6 hours as needed for Pain.   ? albuterol 0.083% (PROVENTIL) 2.5 mg /3 mL (0.083 %) nebulizer solution Inhale 3 mL solution by nebulizer as directed every 6 hours as needed for Wheezing or Shortness of Breath. Indications: asthma   ? albuterol sulfate (PROAIR HFA) 90 mcg/actuation aerosol inhaler Inhale two puffs by mouth into the lungs every 4 hours as needed for Wheezing or Shortness of Breath.   ? aspirin EC 81 mg tablet Take one tablet by mouth daily. Take with food.   ? atorvastatin (LIPITOR) 40 mg tablet TAKE (1) TABLET BY MOUTH ONCE DAILY   ? azithromycin (ZITHROMAX) 250 mg tablet Z-PAK: Take 2 tabs by mouth on day 1, followed by 1 tab by mouth daily on days 2-5.   ? budesonide-formoterol (SYMBICORT HFA) 160-4.5 mcg/actuation inhalation Inhale two puffs by mouth into the lungs twice daily. Rinse mouth after use.   ? bumetanide (BUMEX) 2 mg tablet Take one tablet by mouth every 48 hours. Take extra dose for swelling   ? cholecalciferol (VITAMIN D-3) 1,000 units tablet Take 1,000 Units by mouth daily.   ? doxycycline (MONODOX) 100 mg capsule Take one capsule by mouth daily.   ? duloxetine DR (CYMBALTA) 60 mg capsule Take one capsule by mouth daily.   ? empagliflozin (JARDIANCE) 10 mg tablet Take one tablet by mouth daily.   ? finasteride (PROSCAR) 5 mg tablet Take one tablet by mouth daily.   ? fish oil- omega 3-DHA/EPA 300/1,000 mg capsule Take 1 capsule by mouth daily.   ? gabapentin (NEURONTIN) 400 mg capsule Take 1 capsule by mouth three times daily as needed.   ? ipratropium (ATROVENT) 0.03 % nasal spray Apply two sprays to each nostril as directed every 12 hours.   ? Miscellaneous Medical Supply misc Rx: Please wrap legs with ACE bandage from toes as high up to the leg as possible bilaterally  Dx: Lymphedema   ? montelukast (SINGULAIR) 10 mg tablet Take one tablet by mouth at bedtime daily.   ? nitroglycerin (NITROSTAT) 0.4 mg tablet Place 0.4 mg under tongue every 5 minutes as needed for Chest Pain. Max of 3 tablets, call 911.   ? pantoprazole DR (PROTONIX) 40 mg tablet  TAKE (1) TABLET BY MOUTH TWO TIMES A DAY.   ? predniSONE (DELTASONE) 20 mg tablet Take two tablets by mouth daily for 5 days.   ? sodium chloride 3 % nebulizer solution Inhale 4 mL by mouth into the lungs twice daily. Use with [place].  Indications: asthma, chronic cough, bronchiectasis   ? spironolactone (ALDACTONE) 25 mg tablet Take one tablet by mouth twice daily with food.   ? tamsulosin (FLOMAX) 0.4 mg capsule Take one capsule by mouth daily.     Vitals:    09/02/19 0906   BP: 110/67   Pulse: 88   SpO2: 94%   Weight: 133.8 kg (295 lb)   Height: (P) 180.3 cm (71)   PainSc: (P) Zero       Body mass index is 41.14 kg/m? (pended).     Physical Exam  Vitals and nursing note reviewed.   Constitutional:       General: He is not in acute distress.     Appearance: He is well-developed. He is not diaphoretic. HENT:      Head: Normocephalic and atraumatic.   Neck:      Thyroid: No thyromegaly.      Vascular: No JVD.   Cardiovascular:      Rate and Rhythm: Normal rate and regular rhythm.      Heart sounds: Normal heart sounds. No murmur. No friction rub. No gallop.    Pulmonary:      Effort: Pulmonary effort is normal. No respiratory distress.      Breath sounds: Wheezing present. No rales.   Musculoskeletal:      Right lower leg: No edema.      Left lower leg: No edema.   Neurological:      Mental Status: He is alert.         Labwork reviewed:  Lab Results   Component Value Date/Time    HGBA1C 6.2 (A) 04/28/2019 12:00 AM    HGBA1C 6.3 (H) 12/05/2018 01:57 AM    HGBA1C 6.7 (H) 08/12/2018 12:00 AM    HGBPOC 11.2 (L) 10/25/2012 10:58 AM    HGBPOC 12.9 (L) 10/25/2012 08:55 AM    MCALBR 7.6 04/29/2019 12:00 AM    TSH 2.75 04/28/2019 12:00 AM    FREET4R 0.99 12/15/2014 09:12 AM    CHOL 106 12/05/2018 01:57 AM    TRIG 108 12/05/2018 01:57 AM    HDL 31 (L) 12/05/2018 01:57 AM    LDL 60 12/05/2018 01:57 AM    NA 139 04/28/2019 12:00 AM    K 4.6 04/28/2019 12:00 AM    CL 100 04/28/2019 12:00 AM    CO2 32.0 04/28/2019 12:00 AM    GAP 9 12/31/2018 12:04 PM    BUN 27.0 (A) 04/28/2019 12:00 AM    CR 1.4 (A) 04/28/2019 12:00 AM    GLU 147 (A) 04/28/2019 12:00 AM    CA 9.8 04/28/2019 12:00 AM    PO4 3.2 12/21/2018 10:34 PM    ALBUMIN 3.5 04/28/2019 12:00 AM    TOTPROT 7.4 04/28/2019 12:00 AM    ALKPHOS 103 04/28/2019 12:00 AM    AST 15 04/28/2019 12:00 AM    ALT 12 (A) 04/28/2019 12:00 AM    TOTBILI 0.6 04/28/2019 12:00 AM    GFR 52 04/28/2019 12:00 AM    GFRAA >60 12/31/2018 12:04 PM    PSA 2.96 11/25/2018 12:00 AM              Assessment and Plan:  1. Bronchiectasis with acute lower respiratory infection (HCC)    2. Type 2 diabetes mellitus with hyperglycemia, without long-term current use of insulin (HCC)    3. Chronic combined systolic and diastolic congestive heart failure, NYHA class 2 (HCC)    4. Essential (primary) hypertension And is here today with increased cough and shortness of breath.  This is typical flare of his bronchiectasis.  I am going to start him on levofloxacin 750 mg once daily for 7 days as well as a prednisone burst with 40 mg x 5 days.  I have not seen any evidence of decompensated heart failure, so he will continue with his current goal-directed medical therapy.  His blood pressure is at target.  I did check an A1c today with his labs given that he has type 2 diabetes.  This has typically been under good control.     Total of 40 minutes were spent on the same day of the visit including preparing to see the patient, obtaining and/or reviewing separately obtained history, performing a medically appropriate examination and/or evaluation, counseling and educating the patient/family/caregiver, ordering medications, tests, or procedures, referring and communication with other health care professionals, documenting clinical information in the electronic or other health record, independently interpreting results and communicating results to the patient/family/caregiver, and care coordination.           There are no Patient Instructions on file for this visit.    No follow-ups on file.

## 2019-09-03 ENCOUNTER — Encounter: Admit: 2019-09-03 | Discharge: 2019-09-03 | Payer: MEDICARE

## 2019-09-03 DIAGNOSIS — I10 Essential (primary) hypertension: Secondary | ICD-10-CM

## 2019-09-03 DIAGNOSIS — J470 Bronchiectasis with acute lower respiratory infection: Secondary | ICD-10-CM

## 2019-09-04 ENCOUNTER — Ambulatory Visit: Admit: 2019-09-04 | Discharge: 2019-09-04 | Payer: MEDICARE

## 2019-09-04 DIAGNOSIS — I5042 Chronic combined systolic (congestive) and diastolic (congestive) heart failure: Secondary | ICD-10-CM

## 2019-09-11 NOTE — Patient Instructions
Clinic Visit Summary:   Please contact Pulmonary Nurse Coordinator with signs and symptoms of worsening productive cough with thick secretions, blood in sputum, chest tightness/pain, shortness of breath, fever, chills, night sweats, or any questions or concerns.     Desir?e Evans Poppa, BSN,RN - Pulmonary RN Coordinator    Phone [phone]        Fax [phone]  For refills on medications, please have your pharmacy fax a refill authorization request form to our office at Fax) [phone]. Please allow at least 3 business days for refill requests.   For urgent issues after business hours/weekends/holidays call [phone] and request for the pulmonary fellow to be paged

## 2019-09-12 ENCOUNTER — Encounter: Admit: 2019-09-12 | Discharge: 2019-09-12 | Payer: MEDICARE

## 2019-09-12 ENCOUNTER — Ambulatory Visit: Admit: 2019-09-12 | Discharge: 2019-09-13 | Payer: MEDICARE

## 2019-09-12 DIAGNOSIS — L03116 Cellulitis of left lower limb: Secondary | ICD-10-CM

## 2019-09-12 DIAGNOSIS — R609 Edema, unspecified: Secondary | ICD-10-CM

## 2019-09-12 DIAGNOSIS — I5042 Chronic combined systolic (congestive) and diastolic (congestive) heart failure: Secondary | ICD-10-CM

## 2019-09-12 DIAGNOSIS — R05 Cough: Secondary | ICD-10-CM

## 2019-09-12 DIAGNOSIS — E782 Mixed hyperlipidemia: Secondary | ICD-10-CM

## 2019-09-12 DIAGNOSIS — I509 Heart failure, unspecified: Secondary | ICD-10-CM

## 2019-09-12 DIAGNOSIS — Z8679 Personal history of other diseases of the circulatory system: Secondary | ICD-10-CM

## 2019-09-12 DIAGNOSIS — U071 COVID-19 virus infection: Secondary | ICD-10-CM

## 2019-09-12 DIAGNOSIS — M961 Postlaminectomy syndrome, not elsewhere classified: Secondary | ICD-10-CM

## 2019-09-12 DIAGNOSIS — G4733 Obstructive sleep apnea (adult) (pediatric): Secondary | ICD-10-CM

## 2019-09-12 DIAGNOSIS — I714 Abdominal aortic aneurysm, without rupture: Secondary | ICD-10-CM

## 2019-09-12 DIAGNOSIS — R0600 Dyspnea, unspecified: Secondary | ICD-10-CM

## 2019-09-12 DIAGNOSIS — Z95828 Presence of other vascular implants and grafts: Secondary | ICD-10-CM

## 2019-09-12 DIAGNOSIS — J449 Chronic obstructive pulmonary disease, unspecified: Secondary | ICD-10-CM

## 2019-09-12 DIAGNOSIS — J189 Pneumonia, unspecified organism: Secondary | ICD-10-CM

## 2019-09-12 DIAGNOSIS — J302 Other seasonal allergic rhinitis: Secondary | ICD-10-CM

## 2019-09-12 DIAGNOSIS — K219 Gastro-esophageal reflux disease without esophagitis: Secondary | ICD-10-CM

## 2019-09-12 DIAGNOSIS — J479 Bronchiectasis, uncomplicated: Secondary | ICD-10-CM

## 2019-09-12 DIAGNOSIS — I429 Cardiomyopathy, unspecified: Secondary | ICD-10-CM

## 2019-09-12 DIAGNOSIS — I2510 Atherosclerotic heart disease of native coronary artery without angina pectoris: Secondary | ICD-10-CM

## 2019-09-12 DIAGNOSIS — I10 Essential (primary) hypertension: Secondary | ICD-10-CM

## 2019-09-12 DIAGNOSIS — M954 Acquired deformity of chest and rib: Secondary | ICD-10-CM

## 2019-09-12 DIAGNOSIS — N183 CKD (chronic kidney disease) stage 3, GFR 30-59 ml/min (HCC): Secondary | ICD-10-CM

## 2019-09-12 DIAGNOSIS — J9611 Chronic respiratory failure with hypoxia: Secondary | ICD-10-CM

## 2019-09-12 DIAGNOSIS — N401 Enlarged prostate with lower urinary tract symptoms: Secondary | ICD-10-CM

## 2019-09-12 DIAGNOSIS — J45909 Unspecified asthma, uncomplicated: Secondary | ICD-10-CM

## 2019-09-12 DIAGNOSIS — J4550 Severe persistent asthma, uncomplicated: Secondary | ICD-10-CM

## 2019-09-12 DIAGNOSIS — Z8614 Personal history of Methicillin resistant Staphylococcus aureus infection: Secondary | ICD-10-CM

## 2019-09-12 DIAGNOSIS — Z9981 Dependence on supplemental oxygen: Secondary | ICD-10-CM

## 2019-09-12 NOTE — Progress Notes
Did patient read financial policy, consent to treat, and notice of privacy practices? Yes    Does the patient give verbal consent to each policy? Yes    Does the patient have any vitals to report? No    N/A Vitals charted in O2? No    Is the patient in pain? 0 = No pain    Screening questions completed? Yes    Is the patient able to acces the Mychart message with the start visit link? Yes    Is patient in virtual waiting room Yes

## 2019-09-12 NOTE — Progress Notes
Telehealth Visit Note    Date of Service: 09/12/2019    Subjective:      Obtained patient's verbal consent to treat them and their agreement to [facility] financial policy and NPP via this telehealth visit during the [facility] Emergency       Ruben Wood. Ruben Wood is a 79 y.o. male.    History of Present Illness    09-12-19 visit: Pt seen via telehealth for pulmonary follow-up.  [name] was seen by Dr. Crecencio Mc last Tuesday.  He was started on a Z-pak and prednisone.  He was not getting better.  Seen in clinic.  He was started on Levaquin and prednisone. He was never febrile.  Cough was much worse with chest congestion.  He was able to recover without hospitalization.  Currently back to his baseline.  Shortness of breath is stable.  Pt remains limited in his activities for a number of reasons.  Cough is stable.  No wheeze.  Some new LE edema.  Weight may be up.  He and his wife will double check with heart failure team to make certain CardioMemss recordings stable.         Review of Systems  See above.        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 Notably he missed several days of diuretics while in an inpatient rehab facility the prior week. On admission febrile and requiring 60L O2/min 100% FiO2. ?He was treated with IV diuresis, ceftriaxone for a suspected UTI and vancomycin for suspected left lower extremity cellulitis. ?He was was started on a prolonged high dose dexamethasone taper due to concern for a post-covid fibroproliferative pulmonary disorder. ?He was stabilized and transferred to the floor 11/1.   ?  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 Pt with edema and possible CHF, however, still with hypoxia after adequate diuresis  ?  CT Chest on 11/2: Decrease in patchy, mid and lower lung predominant groundglass opacities within both lungs, with increasing areas of reticulation, traction bronchiectasis, and architectural distortion in the areas, compatible with developing fibrosis. Again, findings are likely related to reported prior COVID 19 infection. Mild cardiomegaly and at least moderate coronary artery calcification.   ?  Pt started on dexamethasone due to concern for fibrosis related to covid, transitioned to prednisone tomorrow per pulm recs. Pt will take 30mg  for total 7 days, then 20mg  until follow up with pulm as outpt.   ?  Pt satting well on 2-3 L oxygen at rest, 4L needed on ex ox with RT. CM to arrange home oxygen. Will need restart doxycycline on discharge. Pulm consult followed during admission and will follow as outpt. Cont bipap QHS, albuterol and atrovent nebs, symbicort and hypertonic saline nebs. Cont PJP prophylaxis while on high dose steroid.   ?  2)ID- UTI, cellulitis  Pt treated with vancomycin and rocephin. Stopped on 11/5, will monitor off antibiotics for sx of recurrence. Once pt improved from respiratory status, could consider outpt evaluation by vascular surgery, will defer to PCP to arrange in future. ID followed during admission, appreciate assistance. Cont amitryptiline/gaba/emu oil for legs  ?  Art [place] Right leg: Interval  development of moderate to moderately severe predominantly calcified plaque in the distal right superficial femoral artery with spectral findings compatible with 50-70% stenosis and minimal findings of arterial insufficiency distally; Left leg Tortuosity and collaterals in the mid superficial femoral artery with mild arterial insufficiency changes distally without visualization of a focal critical stenosis Interval development of abnormally low arterial resistance throughout the trifurcation and dorsalis pedis arteries with no focal stenosis identified. Findings could be secondary to hyperemia associated with wounds or other sources of infection. Mild superimposed arterial insufficiency could also contribute to these findings   ?  3)Cardio- ?coronary disease status post stenting, heart failure with reduced ejection fraction, ischemic cardiomyopathy status post AICD, AAA status post EVAR in 2014, hyperlipidemia  Pt aggressively diuresed with bumex. Daily CardioMEMs on 11/2 PAD is 12 mmHg; goal PAD 11-13 mmHg, reading on 11/4 was 5. Cont asa, fish oil, bumex 2mg  daily, sprinolactone 25mg  bid. CHF team following, appreciate their assistance  ?  4)Renal-CKD III, BPH  -cont finasteride and flomax  ?  5)FEN- cont vitamin B 12 injection for deficiency  ?  6)GI- cont PPI while on steroids  ?  CT Chest from 11-2:  CT Chest     Clinical Indication: ?Evaluate coated fibrosis     Technique: Multiple contiguous axial CT images were obtained through the   chest without IV contrast. Post processing coronal and sagittal   reconstruction images were made from the axial images.     IV contrast: None.     Comparison: CT chest 12/14/2018     Findings:     Evaluation of the mediastinum and hila, including the vasculature and for   lymphadenopathy, is limited without the use of IV contrast.     Axilla, Mediastinum and Hila: No axillary, mediastinal, or hilar   adenopathy.     Heart and Great Vessels: Heart is mildly enlarged without significant   pericardial effusion. Cardiac conduction device remains in place. At least   moderate coronary artery calcification. CardioMEMs device again seen   within the left lower lobe pulmonary artery.     Airway, Lungs and Pleura: Mild emphysema. Decrease in previously seen mid   and lower lung predominant groundglass opacities, with increasing reticulation, traction bronchiectasis, and architectural distortion in   these areas. No new consolidation or groundglass opacity. No pleural   effusion.[name]     Upper Abdomen: Surgical clips in the gallbladder fossa. Partial   visualization of a left lateral abdominal wall hernia.     Chest Wall and Osseous Structures: Mild bilateral gynecomastia. Thoracic   spondylosis. Old rib fractures. No aggressive osseous lesions.     IMPRESSION     1. Decrease in patchy, mid and lower lung predominant groundglass   opacities within both lungs, with increasing areas of reticulation,   traction bronchiectasis, and architectural distortion in the areas,   compatible with developing fibrosis. Again, findings are likely related to   reported prior COVID 19 infection.   2. Mild cardiomegaly and at least moderate coronary artery calcification.       ?Finalized by Francis Dowse, M.D. on 12/24/2018 7:58 AM. Dictated by Francis Dowse, M.D. on 12/24/2018 7:47 AM.   ?  Review of Systems  Baseline shortness of breath and marked fatigue.  Feels globally weak as well.  Remainder of 14 point ROS discussed and reported as negative.  ?  Results for DANNE, HEMPLE ED (MRN [id number]) as of 03/09/2018 12:53  ? Ref. Range 05/21/2017 04:27  07/02/2017 19:00 08/28/2017 17:18   Absolute Eosinophil Count Latest Ref Range: 0 - 0.45 K/UL 0.10 0.10 0.40   ?  Results for KEALA, GORMLEY ED (MRN [id number]) as of 03/09/2018 12:53  ? Ref. Range 02/25/2016 16:10 09/14/2016 12:57 10/06/2016 12:21 02/28/2017 16:15 04/19/2017 11:45 05/05/2017 04:01 09/11/2017 13:56   C-ANCA Latest Units: TITER <20,NEGATIVE ? ? ? ? ? ?   P-ANCA Latest Units: TITER <20,NEGATIVE ? ? ? ? ? ?   Myeloperoxidase AB Unknown <0.2.Marland KitchenMarland Kitchen ? ? ? ? ? ?   Serine Protease3 AB Unknown <0.2.Marland KitchenMarland Kitchen ? ? ? ? ? ?   Complement CH50 Latest Ref Range: 41 - 95 U/mL 67 ? ? ? ? ? ?   IgG Latest Ref Range: 762 - 1,488 MG/DL 454 (L) 098 (L) ? 531 (L) 472 (L) 492 (L) 737 (L)   IgM Latest Ref Range: 38 - 328 MG/DL 119 ? ? 147 ? ? 829   IgE Latest Ref Range: <165 IU/ML 17 ? 31 ? ? ? ?   IgA Latest Ref Range: 70 - 390 MG/DL 562 ? ? 130 ? ? 865   ?  ?  03-02-2018:  CT Chest:  IMPRESSION    1. ?Mild emphysema with scattered areas of scarring. Mild lower lobe   bronchiectasis with areas of peribronchial thickening and mild patchy and   reticular opacities, greater on the left, which may be from chronic   scarring and/or pneumonitis possibly from aspiration. No superimposed   consolidation or pleural effusion.    2. Mild cardiomegaly. Coronary artery and aortic valve calcifications.    3. No thoracic lymphadenopathy.      ?Finalized by Ancil Boozer, M.D. on 03/02/2018 11:19 AM. Dictated by Ancil Boozer, M.D. on 03/02/2018 11:05 AM.  ?  ECHO 12-2017:  Interpretation Summary   ?  ?  ? Technically difficult study with poor endocardial visualization.  ? Wall motion abnormality as below is similar to prior echo.  ? Valves not seen but no doppler evidence of significant valvular disease.  ?  ? LV is moderately dilated by volume criteria.   ? Reduced left ventricular systolic function with EF = 45%.  ? Estimated pulmonary artery systolic pressure is 40?mmHg.  ? In comparison to prior study dated 05/03/17, no significant changes are seen. ?  ?   ?  Nuclear Report  ?  [facility]???????????????????????????  Divison of Nuclear Cardiac Imaging  Consultation Report  ?  EXAMINATION:?12m?Technetium-Pyrophosphate imaging for transthyretin cardiac amyloidosis.  ?  Date of Study: ?02/27/18  Study #: ?[id number]  [place] MRN: ?[id number]  [place] Billing ID: ?[id number]  Referring Physician: ?Vena Austria, MD  Requested by: ?Lamont Snowball, MD  BMI: 38.5?kg/m2  ?  INDICATIONS FOR STUDY (HISTORY):?This is a 79 year old gentleman being evaluated for moderate concentric left ventricular hypertrophy and reduced global left ventricular ejection fraction.  ?  PROCEDURAL DETAILS:?Two hours after weight-adjusted intravenous injection of 21.8?mCi of 33m?Technetium-Pyrophosphate, Planar?and SPECT?images were obtained. The exam was performed supine with low energy-high resolution collimators adjusting acquisitions of 750,000 counts. Heart-contralateral lung (H/CL) ratio was calculated at the two hour acquisition. ?Comparisons of heart uptake to rib uptake were made, and then graded on a scale of 0-3.  ?  FINDINGS:   2 hours post tracer injection there is normal rib and bone uptake but no myocardial uptake of tracer.?  ?  SCINTIGRAPIC (Planar/SPECT):  Both planar and SPECT images are negative for myocardial uptake of  tracer.  ?  Two hour heart/contralateral lung (H/CL) ratio: ?1.115?this metric is negative and not suggestive of ATTR cardiac amyloidosis and falls within the low equivocal range.  ?  ?  Visual Semi-Quantitative Grading Scale Analysis:?  This study is grade 0 and not suggestive of ATTR cardiac amyloidosis.  ?  SUMMARY/OPINION:   This study is negative and not suggestive of ATTR cardiac amyloidosis. ?The low equivocal heart contralateral lung ratio is likely due to variable attenuation and slight residual blood pool activity. ?The negative study does not exclude AL cardiac amyloidosis    Objective:         ? acetaminophen (TYLENOL) 325 mg tablet Take two tablets by mouth every 6 hours as needed for Pain.   ? albuterol 0.083% (PROVENTIL) 2.5 mg /3 mL (0.083 %) nebulizer solution Inhale 3 mL solution by nebulizer as directed every 6 hours as needed for Wheezing or Shortness of Breath. Indications: asthma   ? albuterol sulfate (PROAIR HFA) 90 mcg/actuation aerosol inhaler Inhale two puffs by mouth into the lungs every 4 hours as needed for Wheezing or Shortness of Breath.   ? aspirin EC 81 mg tablet Take one tablet by mouth daily. Take with food.   ? atorvastatin (LIPITOR) 40 mg tablet TAKE (1) TABLET BY MOUTH ONCE DAILY   ? azithromycin (ZITHROMAX) 250 mg tablet Z-PAK: Take 2 tabs by mouth on day 1, followed by 1 tab by mouth daily on days 2-5.   ? budesonide-formoterol (SYMBICORT HFA) 160-4.5 mcg/actuation inhalation Inhale two puffs by mouth into the lungs twice daily. Rinse mouth after use.   ? bumetanide (BUMEX) 2 mg tablet Take one tablet by mouth every 48 hours. Take extra dose for swelling   ? cholecalciferol (VITAMIN D-3) 1,000 units tablet Take 1,000 Units by mouth daily.   ? doxycycline (MONODOX) 100 mg capsule Take one capsule by mouth daily.   ? duloxetine DR (CYMBALTA) 60 mg capsule Take one capsule by mouth daily.   ? empagliflozin (JARDIANCE) 10 mg tablet Take one tablet by mouth daily.   ? finasteride (PROSCAR) 5 mg tablet Take one tablet by mouth daily.   ? fish oil- omega 3-DHA/EPA 300/1,000 mg capsule Take 1 capsule by mouth daily.   ? gabapentin (NEURONTIN) 400 mg capsule Take 1 capsule by mouth three times daily as needed.   ? ipratropium (ATROVENT) 0.03 % nasal spray Apply two sprays to each nostril as directed every 12 hours.   ? Miscellaneous Medical Supply misc Rx: Please wrap legs with ACE bandage from toes as high up to the leg as possible bilaterally  Dx: Lymphedema   ? montelukast (SINGULAIR) 10 mg tablet Take one tablet by mouth at bedtime daily.   ? nitroglycerin (NITROSTAT) 0.4 mg tablet Place 0.4 mg under tongue every 5 minutes as needed for Chest Pain. Max of 3 tablets, call 911.   ? pantoprazole DR (PROTONIX) 40 mg tablet TAKE (1) TABLET BY MOUTH TWO TIMES A DAY.   ? sodium chloride 3 % nebulizer solution Inhale 4 mL by mouth into the lungs twice daily. Use with [place].  Indications: asthma, chronic cough, bronchiectasis   ? spironolactone (ALDACTONE) 25 mg tablet Take one tablet by mouth twice daily with food.   ? tamsulosin (FLOMAX) 0.4 mg capsule Take one capsule by mouth daily.     There were no vitals filed for this visit.  There is no height or weight on file to calculate BMI.  Physical Exam    Appears to be at his baseline via monitor.  Will occasionally cough during our conversation.  Morbidly obese.  NAD. Breathing comfortable.  Able to speak in complete sentences.        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    Recent exacerbation treated with antibiotics and prednisone.  Levaquin seemed to help the most.  Will remember this for future bronchitis symptoms.  Offered pt to contact our office with pulmonary symptoms.  ??  OSA  -- Continue current CPAP settings.  -- No complaints  ?  Allergic Asthma  - Acute Bronchitis July 2021.  Treated at home.  No admission.  - COVID infection in October 2020  - Hospitalized 02-2018 for decompensated heart failure, cellulitis and possible pneumonia.  - Hospitalized 04-20-2017 for combination of acute on chronic CHF exacerbation with probable asthma component as well.  - Hospitalized again in May 2019 for decompensated heart failure and probable asthma component as well.  - Hospitalized September 25th s/p fall with shoulder dislocation. ?Developed MRSA pneumonia while inpatient and at rehab.  - Asthma/Bronchiectasis is currently stable on therapy.??Difficult to ascertain asthma from CHF at times.??  - Continue Symbicort, albuterol, ipratropium, montelukast.  - History of eosinophilia.  ?  Results for SIRIUS, LUMPP (MRN [id number]) as of 10/03/2017 09:23  ? Ref. Range 08/28/2017 17:18   Eosinophils Latest Ref Range: 0 - 5 % 5   Absolute Eosinophil Count Latest Ref Range: 0 - 0.45 K/UL 0.40   ?  ?  Allergic Rhinitis  - Fall worst time of year  - Some increased sinus drainage. ?Mucus cough controlled with use of mucinex.  ?????????????>?Continue Singulair  ?????????????>?Remainder per Allergy  ?  Chronic sinusitis:?unchanged  - Stable  ?  Bronchiectasis  -?Proven RML bronchiectasis on previous outside CT scan  -?Continue aggressive pulmonary hygeine with flutter valve as needed.??Doing much better with addition of VEST and 3% saline therapy. ?Continue.  - Cough?control is better.  ?  Concern for aspiration:?swallow study negative previously.  ?  GERD:?Continue PPI per GI?(stable)  ?  S/p lung herniation with bio-bridge repair.  - Follow-up outpatient with Dr. Seth Bake. ?Likely not repeat surgical candidate.  ?  AAA s/p endovascular repair:?Follow up with Dr. Chales Abrahams as scheduled  ?  CAD/CHF: currently compensated  -- ?Pt was doing well  -- ?Re-initiate cardiac rehab once he has recovered from his injuries.   ?  Morbid obesity  - Reports some weight gain with prednisone use  ?????????????>?Encouraged continued activity and attempts at weight loss  ?  BPH:?Previously referred to urology  ?  Chronic respiratory failure with hypoxia:  -- Pt requires supplemental oxygen with exertion and at rest prn.  ?    ?  ?  RTC?in 6 months.  Flu vaccine?UTD.  UTD on pneumonia vaccines  Call with changes in symptoms or worsening lung issues.  COVID vaccinated.                           25 minutes spent on this patient's encounter with counseling and coordination of care taking >50% of the visit.

## 2019-09-17 ENCOUNTER — Encounter: Admit: 2019-09-17 | Discharge: 2019-09-17 | Payer: MEDICARE

## 2019-09-17 NOTE — Telephone Encounter
Patient's wife called stating that patient moved differently but no injury and is having right shoulder discomfort.  He saw chiropractor today and had a spine adjustment and ultrasound.  He is using a topical cream and will ice and use OTC ibuprofen.    Advised an appointment if symptoms do not improve.    Etta Grandchild, RN

## 2019-09-18 ENCOUNTER — Encounter: Admit: 2019-09-18 | Discharge: 2019-09-18 | Payer: MEDICARE

## 2019-09-18 NOTE — Telephone Encounter
Received notification that  [employer] has not been connected since 09-10-2018.      Patient was instructed to look at his/her transmitter to make sure that it is plugged into power and send a manual transmission to reconnect the transmitter. If he/she has any questions about how to send a transmission or if the transmitter does not appear to be working properly, they need to contact the device company directly. Patient was provided with that contact number. Requested the patient send [place] a MyChart message or contact our device nurses at [phone] to let us know after they have sent their transmission.   Called preferred  Phone number [phone], Spoke with [place]. Asked that we call laster or send my chart. MyChart is setup and active last log in on 09-17-19. MyChart message sent     CDJ

## 2019-10-06 ENCOUNTER — Ambulatory Visit: Admit: 2019-10-06 | Discharge: 2019-10-06 | Payer: MEDICARE

## 2019-10-06 ENCOUNTER — Encounter: Admit: 2019-10-06 | Discharge: 2019-10-06 | Payer: MEDICARE

## 2019-10-06 DIAGNOSIS — R05 Cough: Secondary | ICD-10-CM

## 2019-10-06 DIAGNOSIS — J209 Acute bronchitis, unspecified: Secondary | ICD-10-CM

## 2019-10-06 DIAGNOSIS — Z20822 Encounter for screening laboratory testing for COVID-19 virus: Secondary | ICD-10-CM

## 2019-10-06 DIAGNOSIS — I5042 Chronic combined systolic (congestive) and diastolic (congestive) heart failure: Secondary | ICD-10-CM

## 2019-10-06 MED ORDER — AZITHROMYCIN 250 MG PO TAB
ORAL_TABLET | Freq: Every day | ORAL | 0 refills | Status: AC
Start: 2019-10-06 — End: ?

## 2019-10-06 NOTE — Telephone Encounter
From review of the chart, it does look like he typically does get azithromycin about once a month forsimilar indications.  zpack sent in, resume doxy when done.    I do want to add, that we are recommending all persons get a covid-19 test when having symptoms, regardless of vaccination status.  Please give him hotline # and the order is in.    Dr. Annia Belt

## 2019-10-06 NOTE — Telephone Encounter
Pt's wife lvm on nurse line reporting that Ed has a deep cough and respiratory issues since Friday. [name], pt's wife reports that pt has had a bad weekend.  Pt's wife is asking if he can get an antibiotic sent to their preferred pharmacy.    Routing to Dr. Annia Belt, covering physician for review.     David Stall, RN

## 2019-10-07 ENCOUNTER — Encounter: Admit: 2019-10-07 | Discharge: 2019-10-07 | Payer: MEDICARE

## 2019-10-07 DIAGNOSIS — J470 Bronchiectasis with acute lower respiratory infection: Secondary | ICD-10-CM

## 2019-10-07 DIAGNOSIS — J4541 Moderate persistent asthma with (acute) exacerbation: Secondary | ICD-10-CM

## 2019-10-07 MED ORDER — LEVOFLOXACIN 750 MG PO TAB
750 mg | ORAL_TABLET | Freq: Every day | ORAL | 0 refills | 7.00000 days | Status: AC
Start: 2019-10-07 — End: ?

## 2019-10-07 MED ORDER — PREDNISONE 20 MG PO TAB
40 mg | ORAL_TABLET | Freq: Every day | ORAL | 0 refills | Status: AC
Start: 2019-10-07 — End: ?

## 2019-10-13 ENCOUNTER — Encounter: Admit: 2019-10-13 | Discharge: 2019-10-13 | Payer: MEDICARE

## 2019-10-13 NOTE — Telephone Encounter
Patient/ patient's wife asking if once he's feeling better if Dr. Crecencio Mc thinks patient should get the COVID booster shot?  States they can get it locally but wanted to know Dr. Lowella Bandy recommendations.    Etta Grandchild, RN

## 2019-10-13 NOTE — Telephone Encounter
Yes, I definitely want him to get the booster shot.

## 2019-10-13 NOTE — Telephone Encounter
I called patient's wife to make aware.  She verbalizes understanding.    Etta Grandchild, RN

## 2019-10-15 ENCOUNTER — Encounter: Admit: 2019-10-15 | Discharge: 2019-10-15 | Payer: MEDICARE

## 2019-10-15 ENCOUNTER — Ambulatory Visit: Admit: 2019-10-15 | Discharge: 2019-10-15 | Payer: MEDICARE

## 2019-10-20 ENCOUNTER — Encounter: Admit: 2019-10-20 | Discharge: 2019-10-20 | Payer: MEDICARE

## 2019-10-20 NOTE — Telephone Encounter
Patients wife called concerned as he is still having deep congestion.  Also have edema to feet and legs.  He was using knee high compression stocking, but can no longer get them on due to the pain and they are red and angry.  States he's almost out of tramadol and this help for his back and leg pain.    Routing to Dr. Crecencio Mc to advise on  1.  Cough  2.  Edema  3. Tramadol refill    Etta Grandchild, RN

## 2019-10-20 NOTE — Telephone Encounter
I called and scheduled in clinic visit    Etta Grandchild, RN

## 2019-10-20 NOTE — Telephone Encounter
Let's try to add him on tomorrow. Could they do a visit tomorrow at 2pm?  In person would be most ideal, but telehealth would be better than nothing.

## 2019-10-21 ENCOUNTER — Encounter: Admit: 2019-10-21 | Discharge: 2019-10-21 | Payer: MEDICARE

## 2019-10-21 ENCOUNTER — Ambulatory Visit: Admit: 2019-10-21 | Discharge: 2019-10-21 | Payer: MEDICARE

## 2019-10-21 DIAGNOSIS — K219 Gastro-esophageal reflux disease without esophagitis: Secondary | ICD-10-CM

## 2019-10-21 DIAGNOSIS — I2510 Atherosclerotic heart disease of native coronary artery without angina pectoris: Secondary | ICD-10-CM

## 2019-10-21 DIAGNOSIS — I429 Cardiomyopathy, unspecified: Secondary | ICD-10-CM

## 2019-10-21 DIAGNOSIS — R0600 Dyspnea, unspecified: Secondary | ICD-10-CM

## 2019-10-21 DIAGNOSIS — I10 Essential (primary) hypertension: Secondary | ICD-10-CM

## 2019-10-21 DIAGNOSIS — I509 Heart failure, unspecified: Secondary | ICD-10-CM

## 2019-10-21 DIAGNOSIS — R609 Edema, unspecified: Secondary | ICD-10-CM

## 2019-10-21 DIAGNOSIS — J470 Bronchiectasis with acute lower respiratory infection: Secondary | ICD-10-CM

## 2019-10-21 DIAGNOSIS — E782 Mixed hyperlipidemia: Secondary | ICD-10-CM

## 2019-10-21 DIAGNOSIS — L03116 Cellulitis of left lower limb: Secondary | ICD-10-CM

## 2019-10-21 DIAGNOSIS — Z8679 Personal history of other diseases of the circulatory system: Secondary | ICD-10-CM

## 2019-10-21 DIAGNOSIS — I5042 Chronic combined systolic (congestive) and diastolic (congestive) heart failure: Secondary | ICD-10-CM

## 2019-10-21 DIAGNOSIS — M5431 Sciatica, right side: Secondary | ICD-10-CM

## 2019-10-21 DIAGNOSIS — N401 Enlarged prostate with lower urinary tract symptoms: Secondary | ICD-10-CM

## 2019-10-21 DIAGNOSIS — J4541 Moderate persistent asthma with (acute) exacerbation: Secondary | ICD-10-CM

## 2019-10-21 DIAGNOSIS — I739 Peripheral vascular disease, unspecified: Secondary | ICD-10-CM

## 2019-10-21 DIAGNOSIS — G4733 Obstructive sleep apnea (adult) (pediatric): Secondary | ICD-10-CM

## 2019-10-21 DIAGNOSIS — M961 Postlaminectomy syndrome, not elsewhere classified: Secondary | ICD-10-CM

## 2019-10-21 DIAGNOSIS — J479 Bronchiectasis, uncomplicated: Secondary | ICD-10-CM

## 2019-10-21 DIAGNOSIS — J189 Pneumonia, unspecified organism: Secondary | ICD-10-CM

## 2019-10-21 DIAGNOSIS — M954 Acquired deformity of chest and rib: Secondary | ICD-10-CM

## 2019-10-21 DIAGNOSIS — J449 Chronic obstructive pulmonary disease, unspecified: Secondary | ICD-10-CM

## 2019-10-21 DIAGNOSIS — J45909 Unspecified asthma, uncomplicated: Secondary | ICD-10-CM

## 2019-10-21 DIAGNOSIS — Z95828 Presence of other vascular implants and grafts: Secondary | ICD-10-CM

## 2019-10-21 DIAGNOSIS — Z8614 Personal history of Methicillin resistant Staphylococcus aureus infection: Secondary | ICD-10-CM

## 2019-10-21 DIAGNOSIS — R05 Cough: Secondary | ICD-10-CM

## 2019-10-21 DIAGNOSIS — I714 Abdominal aortic aneurysm, without rupture: Secondary | ICD-10-CM

## 2019-10-21 DIAGNOSIS — J302 Other seasonal allergic rhinitis: Secondary | ICD-10-CM

## 2019-10-21 DIAGNOSIS — I872 Venous insufficiency (chronic) (peripheral): Secondary | ICD-10-CM

## 2019-10-21 DIAGNOSIS — Z9981 Dependence on supplemental oxygen: Secondary | ICD-10-CM

## 2019-10-21 DIAGNOSIS — N183 CKD (chronic kidney disease) stage 3, GFR 30-59 ml/min (HCC): Secondary | ICD-10-CM

## 2019-10-21 LAB — CBC AND DIFF
Lab: 0 10*3/uL (ref 0–0.20)
Lab: 0.8 K/UL — ABNORMAL LOW (ref >60)
Lab: 18 % — ABNORMAL LOW (ref 24–44)
Lab: 4 M/UL — ABNORMAL LOW (ref 4.4–5.5)
Lab: 5.7 10*3/uL (ref 4.5–11.0)

## 2019-10-21 LAB — COMPREHENSIVE METABOLIC PANEL
Lab: 0.9 mg/dL (ref 0.3–1.2)
Lab: 1.6 mg/dL — ABNORMAL HIGH (ref 0.4–1.24)
Lab: 103 mg/dL — ABNORMAL HIGH (ref 70–100)
Lab: 140 MMOL/L (ref 137–147)
Lab: 16 U/L (ref 7–56)
Lab: 17 U/L (ref 7–40)
Lab: 28 mg/dL — ABNORMAL HIGH (ref 7–25)
Lab: 32 MMOL/L — ABNORMAL HIGH (ref 21–30)
Lab: 4.3 MMOL/L (ref 3.5–5.1)
Lab: 48 mL/min — ABNORMAL LOW (ref >60)
Lab: 6.8 g/dL (ref 6.0–8.0)
Lab: 68 U/L — ABNORMAL HIGH (ref 25–110)
Lab: 8 10*3/uL (ref 3–12)
Lab: 9.1 mg/dL (ref 8.5–10.6)

## 2019-10-21 LAB — BNP (B-TYPE NATRIURETIC PEPTI): Lab: 100 pg/mL — ABNORMAL HIGH (ref 0–100)

## 2019-10-21 MED ORDER — PREDNISONE 5 MG PO TAB
5 mg | ORAL_TABLET | Freq: Every day | ORAL | 0 refills | Status: AC
Start: 2019-10-21 — End: ?

## 2019-10-21 MED ORDER — TRAMADOL 50 MG PO TAB
50 mg | ORAL_TABLET | Freq: Two times a day (BID) | ORAL | 0 refills | Status: AC | PRN
Start: 2019-10-21 — End: ?

## 2019-10-21 MED ORDER — PREDNISONE 20 MG PO TAB
ORAL_TABLET | Freq: Every day | ORAL | 0 refills | Status: AC
Start: 2019-10-21 — End: ?

## 2019-10-21 MED ORDER — DOXYCYCLINE HYCLATE 100 MG PO TAB
100 mg | ORAL_TABLET | Freq: Two times a day (BID) | ORAL | 0 refills | 8.00000 days | Status: AC
Start: 2019-10-21 — End: ?

## 2019-10-21 NOTE — Progress Notes
History of Present Illness  Ruben Wood. is a 79 y.o. male here today for an urgent add on urgent care visit.     Ruben Wood has several issues that he wanted to discuss today.    First, he is having ongoing shortness of breath and wheezing and increased oxygen needs from his baseline.  He has known asthma as well as severe bronchiectasis and gets frequent flares of this.  We have done 2 rounds of levofloxacin as well as one round of a prednisone burst.  He has not noticed any significant improvement with this.  He normally wears about 2 L of oxygen at baseline, but is not wearing it more frequently throughout the day and needing to go up to around 4 L at times.  He does not have any fevers or chills.  He did have a negative Covid test already.    He is having some increased swelling in his lower extremities.  He has significant hemosiderin skin changes.  He has noticed that his left foot in particular has been more red and warm lately.  Is also been much more tender.    He is having significant sciatica on the right side.  This is a sharp shooting pain that can go down the posterior aspect of his right leg.  He would like a refill of the tramadol.  He has tried physical therapy as well as cupping and other physical therapy treatments, but nothing has really helped.       Review of Systems  Per HPI    Objective:         ? acetaminophen (TYLENOL) 325 mg tablet Take two tablets by mouth every 6 hours as needed for Pain.   ? albuterol 0.083% (PROVENTIL) 2.5 mg /3 mL (0.083 %) nebulizer solution Inhale 3 mL solution by nebulizer as directed every 6 hours as needed for Wheezing or Shortness of Breath. Indications: asthma   ? albuterol sulfate (PROAIR HFA) 90 mcg/actuation aerosol inhaler Inhale two puffs by mouth into the lungs every 4 hours as needed for Wheezing or Shortness of Breath.   ? aspirin EC 81 mg tablet Take one tablet by mouth daily. Take with food.   ? atorvastatin (LIPITOR) 40 mg tablet TAKE (1) TABLET BY MOUTH ONCE DAILY   ? budesonide-formoterol (SYMBICORT HFA) 160-4.5 mcg/actuation inhalation Inhale two puffs by mouth into the lungs twice daily. Rinse mouth after use.   ? bumetanide (BUMEX) 2 mg tablet Take one tablet by mouth every 48 hours. Take extra dose for swelling   ? cholecalciferol (VITAMIN D-3) 1,000 units tablet Take 1,000 Units by mouth daily.   ? doxycycline (MONODOX) 100 mg capsule Take one capsule by mouth daily.   ? doxycycline hyclate (VIBRACIN) 100 mg tablet Take one tablet by mouth twice daily for 7 days.   ? duloxetine DR (CYMBALTA) 60 mg capsule Take one capsule by mouth daily.   ? empagliflozin (JARDIANCE) 10 mg tablet Take one tablet by mouth daily.   ? finasteride (PROSCAR) 5 mg tablet Take one tablet by mouth daily.   ? fish oil- omega 3-DHA/EPA 300/1,000 mg capsule Take 1 capsule by mouth daily.   ? gabapentin (NEURONTIN) 400 mg capsule Take 1 capsule by mouth three times daily as needed.   ? ipratropium (ATROVENT) 0.03 % nasal spray Apply two sprays to each nostril as directed every 12 hours.   ? levoFLOXacin (LEVAQUIN) 750 mg tablet Take one tablet by mouth daily.   ? Miscellaneous  Medical Supply misc Rx: Please wrap legs with ACE bandage from toes as high up to the leg as possible bilaterally  Dx: Lymphedema   ? montelukast (SINGULAIR) 10 mg tablet Take one tablet by mouth at bedtime daily.   ? nitroglycerin (NITROSTAT) 0.4 mg tablet Place 0.4 mg under tongue every 5 minutes as needed for Chest Pain. Max of 3 tablets, call 911.   ? pantoprazole DR (PROTONIX) 40 mg tablet TAKE (1) TABLET BY MOUTH TWO TIMES A DAY.   ? predniSONE (DELTASONE) 20 mg tablet Take two tablets by mouth daily for 5 days, THEN one tablet daily for 5 days, THEN one-half tablet daily for 5 days.   ? predniSONE (DELTASONE) 5 mg tablet Take one tablet by mouth daily for 5 days. To complete steroid taper   ? sodium chloride 3 % nebulizer solution Inhale 4 mL by mouth into the lungs twice daily. Use with [place]. Indications: asthma, chronic cough, bronchiectasis   ? spironolactone (ALDACTONE) 25 mg tablet Take one tablet by mouth twice daily with food.   ? tamsulosin (FLOMAX) 0.4 mg capsule Take one capsule by mouth daily.   ? traMADoL (ULTRAM) 50 mg tablet Take one tablet by mouth every 12 hours as needed for Pain.     Vitals:    10/21/19 1337   BP: 124/80   Pulse: 78   Resp: 18   Temp: 36.6 ?C (97.9 ?F)   TempSrc: Oral   SpO2: 92%   Weight: 132 kg (291 lb)   PainSc: Ten     Vitals:    10/21/19 1337   BP: 124/80   Pulse: 78   Resp: 18   Temp: 36.6 ?C (97.9 ?F)   TempSrc: Oral   SpO2: 92%   Weight: 132 kg (291 lb)   PainSc: Ten       Body mass index is 40.59 kg/m?[name]     Physical Exam  Vitals and nursing note reviewed.   Constitutional:       General: He is not in acute distress.     Appearance: He is well-developed. He is not diaphoretic.   HENT:      Head: Normocephalic and atraumatic.   Neck:      Thyroid: No thyromegaly.      Vascular: No JVD.   Cardiovascular:      Rate and Rhythm: Normal rate and regular rhythm.      Heart sounds: Murmur heard.   No friction rub. No gallop.    Pulmonary:      Effort: Pulmonary effort is normal. No respiratory distress.      Breath sounds: Wheezing present. No rales.   Musculoskeletal:      Right lower leg: Edema present.      Left lower leg: Edema present.   Skin:     Findings: Erythema present.      Comments: Hemosiderin skin changes, bright erythematous skin changes on left foot with increased warmth and edema   Neurological:      Mental Status: He is alert. Mental status is at baseline.         Labwork reviewed:  Lab Results   Component Value Date/Time    HGBA1C 5.9 09/02/2019 09:50 AM    HGBA1C 6.2 (A) 04/28/2019 12:00 AM    HGBA1C 6.3 (H) 12/05/2018 01:57 AM    HGBPOC 11.2 (L) 10/25/2012 10:58 AM    HGBPOC 12.9 (L) 10/25/2012 08:55 AM    MCALBR 7.6 04/29/2019 12:00 AM    TSH  2.75 04/28/2019 12:00 AM    FREET4R 0.99 12/15/2014 09:12 AM    CHOL 106 12/05/2018 01:57 AM    TRIG 108 12/05/2018 01:57 AM    HDL 31 (L) 12/05/2018 01:57 AM    LDL 60 12/05/2018 01:57 AM    NA 141 09/02/2019 09:50 AM    K 4.7 09/02/2019 09:50 AM    CL 102 09/02/2019 09:50 AM    CO2 28 09/02/2019 09:50 AM    GAP 11 09/02/2019 09:50 AM    BUN 34 (H) 09/02/2019 09:50 AM    CR 1.36 (H) 09/02/2019 09:50 AM    GLU 149 (H) 09/02/2019 09:50 AM    CA 9.4 09/02/2019 09:50 AM    PO4 3.2 12/21/2018 10:34 PM    ALBUMIN 4.3 09/02/2019 09:50 AM    TOTPROT 7.1 09/02/2019 09:50 AM    ALKPHOS 81 09/02/2019 09:50 AM    AST 20 09/02/2019 09:50 AM    ALT 21 09/02/2019 09:50 AM    TOTBILI 0.8 09/02/2019 09:50 AM    GFR 50 (L) 09/02/2019 09:50 AM    GFRAA >60 09/02/2019 09:50 AM    PSA 2.96 11/25/2018 12:00 AM              Assessment and Plan:    1. Moderate persistent asthma with acute exacerbation    2. Bronchiectasis with acute lower respiratory infection (HCC)    3. Chronic combined systolic and diastolic congestive heart failure, NYHA class 2 (HCC)    4. Essential hypertension    5. Sciatica of right side    6. Venous insufficiency    7. Peripheral vascular disease (HCC)    8. Cellulitis of left lower extremity      He is here today with several issues to discuss.  The most pressing is his respiratory issues.  I think that he is having an asthma exacerbation today.  I am going to give him a prednisone taper of 40 mg x 5 days, 20 mg x 5 days, 10 mg x 5 days, and 5 mg x 5 days.  I may give him doxycycline, but this is more for cellulitis on his lower extremity than for his lungs.  I do not think that he has any decompensated heart failure at this time, but I am getting check chest x-ray and a BNP today.  He has completed a course of levofloxacin, Seidel think he needs another round of that at this time.    For his sciatica, I am can refer him down to the spine center.  I did put a refill in for tramadol.    He has a new problem of cellulitis on the left foot.  He does have severe venous insufficiency and I am concerned about some underlying peripheral vascular disease.  I will get a get ABI testing and I want him to see the vascular center.  In the meantime, we will treat the cellulitis with doxycycline.    Total of 40 minutes were spent on the same day of the visit including preparing to see the patient, obtaining and/or reviewing separately obtained history, performing a medically appropriate examination and/or evaluation, counseling and educating the patient/family/caregiver, ordering medications, tests, or procedures, referring and communication with other health care professionals, documenting clinical information in the electronic or other health record, independently interpreting results and communicating results to the patient/family/caregiver, and care coordination.         There are no Patient Instructions on file for this visit.  No follow-ups on file.

## 2019-10-23 ENCOUNTER — Encounter: Admit: 2019-10-23 | Discharge: 2019-10-23 | Payer: MEDICARE

## 2019-10-23 NOTE — Progress Notes
-----   Message -----  From: Minette Brine, RN  Sent: 10/21/2019   3:40 PM CDT  To: [facility] Nurse Interventional Team [place]    Hello,    Please schedule patient with Dr. Chales Abrahams, prev established for AAA.  Patient has ABIs and LE ultrasound ordered.  He has known venous insufficiency, PCP is ruling out arterial component.  If testing proves no arterial issues, patient can be set up with Dr. Junita Push or Dr. Theodoro Grist.      Thank you,  Kait

## 2019-10-23 NOTE — Progress Notes
Msg sent to scheduling to offer appt with Dr. Chales Abrahams at [facility] clinic on 10/6 with ABI/LE duplex same day.

## 2019-10-28 ENCOUNTER — Encounter: Admit: 2019-10-28 | Discharge: 2019-10-28 | Payer: MEDICARE

## 2019-10-28 NOTE — Telephone Encounter
Patients wife called stating he continues to have respirator issues and asking if antibiotic could be refilled.  Also states patient fell a couple of days ago and has wound to his knee.  He didn't want to be seen for this, but hoping antibiotic prescribed for respirator issues would help prevent infection for this as well.    Routing to Dr. Crecencio Mc to advise  Etta Grandchild, RN

## 2019-10-28 NOTE — Telephone Encounter
We have done several rounds of antibiotics without any improvement, so I am not confident this will do anything.  He may need a bronchoscopy if this continues to get a culture and sensitivities.     Dr. [name], do you have other thoughts?  I am starting to run out of ideas.  Do you think a bronch would add any value?

## 2019-10-28 NOTE — Telephone Encounter
I called patient to make aware.    Brecken Dewoody, RN

## 2019-10-29 ENCOUNTER — Encounter: Admit: 2019-10-29 | Discharge: 2019-10-29 | Payer: MEDICARE

## 2019-10-29 DIAGNOSIS — R05 Cough: Secondary | ICD-10-CM

## 2019-10-29 DIAGNOSIS — J470 Bronchiectasis with acute lower respiratory infection: Secondary | ICD-10-CM

## 2019-11-03 ENCOUNTER — Encounter: Admit: 2019-11-03 | Discharge: 2019-11-03 | Payer: MEDICARE

## 2019-11-03 DIAGNOSIS — R05 Cough: Secondary | ICD-10-CM

## 2019-11-03 LAB — CULTURE-RESP,LOWER W/SENSITIVITY

## 2019-11-03 NOTE — Telephone Encounter
Ruben Wood, LVM stating that she just wanted Dr.Sharpe to know that Ed was doing 'much better' and appreciated all the attention and help he provided. She and he are grateful.    Dr.Sharpe advised.

## 2019-11-03 NOTE — Telephone Encounter
Patients wife called to give an update stating patient is feeling much better.  Wanted to say thank you for all of the care and concern we provided.    Routing to Dr. Crecencio Mc as Latanya Maudlin, RN

## 2019-11-06 ENCOUNTER — Ambulatory Visit: Admit: 2019-11-06 | Discharge: 2019-11-06 | Payer: MEDICARE

## 2019-11-06 ENCOUNTER — Encounter: Admit: 2019-11-06 | Discharge: 2019-11-06 | Payer: MEDICARE

## 2019-11-06 DIAGNOSIS — J449 Chronic obstructive pulmonary disease, unspecified: Secondary | ICD-10-CM

## 2019-11-06 DIAGNOSIS — I509 Heart failure, unspecified: Secondary | ICD-10-CM

## 2019-11-06 DIAGNOSIS — J302 Other seasonal allergic rhinitis: Secondary | ICD-10-CM

## 2019-11-06 DIAGNOSIS — E782 Mixed hyperlipidemia: Secondary | ICD-10-CM

## 2019-11-06 DIAGNOSIS — I2510 Atherosclerotic heart disease of native coronary artery without angina pectoris: Secondary | ICD-10-CM

## 2019-11-06 DIAGNOSIS — M954 Acquired deformity of chest and rib: Secondary | ICD-10-CM

## 2019-11-06 DIAGNOSIS — R0600 Dyspnea, unspecified: Secondary | ICD-10-CM

## 2019-11-06 DIAGNOSIS — L03116 Cellulitis of left lower limb: Secondary | ICD-10-CM

## 2019-11-06 DIAGNOSIS — N183 CKD (chronic kidney disease) stage 3, GFR 30-59 ml/min (HCC): Secondary | ICD-10-CM

## 2019-11-06 DIAGNOSIS — M47816 Spondylosis without myelopathy or radiculopathy, lumbar region: Secondary | ICD-10-CM

## 2019-11-06 DIAGNOSIS — I5042 Chronic combined systolic (congestive) and diastolic (congestive) heart failure: Secondary | ICD-10-CM

## 2019-11-06 DIAGNOSIS — Z95828 Presence of other vascular implants and grafts: Secondary | ICD-10-CM

## 2019-11-06 DIAGNOSIS — M4317 Spondylolisthesis, lumbosacral region: Secondary | ICD-10-CM

## 2019-11-06 DIAGNOSIS — Z8614 Personal history of Methicillin resistant Staphylococcus aureus infection: Secondary | ICD-10-CM

## 2019-11-06 DIAGNOSIS — G4733 Obstructive sleep apnea (adult) (pediatric): Secondary | ICD-10-CM

## 2019-11-06 DIAGNOSIS — M961 Postlaminectomy syndrome, not elsewhere classified: Secondary | ICD-10-CM

## 2019-11-06 DIAGNOSIS — J189 Pneumonia, unspecified organism: Secondary | ICD-10-CM

## 2019-11-06 DIAGNOSIS — R609 Edema, unspecified: Secondary | ICD-10-CM

## 2019-11-06 DIAGNOSIS — J479 Bronchiectasis, uncomplicated: Secondary | ICD-10-CM

## 2019-11-06 DIAGNOSIS — R05 Cough: Secondary | ICD-10-CM

## 2019-11-06 DIAGNOSIS — J45909 Unspecified asthma, uncomplicated: Secondary | ICD-10-CM

## 2019-11-06 DIAGNOSIS — K219 Gastro-esophageal reflux disease without esophagitis: Secondary | ICD-10-CM

## 2019-11-06 DIAGNOSIS — I10 Essential (primary) hypertension: Secondary | ICD-10-CM

## 2019-11-06 DIAGNOSIS — Z8679 Personal history of other diseases of the circulatory system: Secondary | ICD-10-CM

## 2019-11-06 DIAGNOSIS — Z9981 Dependence on supplemental oxygen: Secondary | ICD-10-CM

## 2019-11-06 DIAGNOSIS — I714 Abdominal aortic aneurysm, without rupture: Secondary | ICD-10-CM

## 2019-11-06 DIAGNOSIS — I429 Cardiomyopathy, unspecified: Secondary | ICD-10-CM

## 2019-11-06 DIAGNOSIS — N401 Enlarged prostate with lower urinary tract symptoms: Secondary | ICD-10-CM

## 2019-11-06 NOTE — Progress Notes
[facility] CLINIC NOTE       SUBJECTIVE: 79 year old pleasant male with history of ICD placement, neuropathy, and L4-S1 laminectomy presents with LBP and right leg which has been present for many years but has been progressively worsening over the past couple of months. Pain radiates to the anteromedial thigh, does not go past the knee. Aggravated by walking, relieved by sitting and medications. Pain described as gnawing and aching.   He is also reporting L sided back and buttock pain. Pain currently 8/10.   Patient is status post bilateral L3, L4, L5 MBB x2 in March 2020 with good relief.  Unable to have the RFA due to to Covid.    Has been using Tramadol, Tylenol, Gabapentin, massage therapy.with minimal relief.           Review of Systems    Current Outpatient Medications:   ?  acetaminophen (TYLENOL) 325 mg tablet, Take two tablets by mouth every 6 hours as needed for Pain., Disp: 30 tablet, Rfl: 0  ?  albuterol 0.083% (PROVENTIL) 2.5 mg /3 mL (0.083 %) nebulizer solution, Inhale 3 mL solution by nebulizer as directed every 6 hours as needed for Wheezing or Shortness of Breath. Indications: asthma, Disp: 270 mL, Rfl: 11  ?  albuterol sulfate (PROAIR HFA) 90 mcg/actuation aerosol inhaler, Inhale two puffs by mouth into the lungs every 4 hours as needed for Wheezing or Shortness of Breath., Disp: 18 g, Rfl: 11  ?  allopurinoL (ZYLOPRIM) 300 mg tablet, Take 300 mg by mouth daily. Take with food., Disp: , Rfl:   ?  amitriptyline/gabapentin/emu oil(#) (AMITRIPTYLINE/GABAPENTIN/EMU OIL(#)) 05/24/08 %, Apply  topically to affected area., Disp: , Rfl:   ?  ascorbic acid-ascorbate sodium 500 mg chew, Chew  by mouth., Disp: , Rfl:   ?  aspirin EC 81 mg tablet, Take one tablet by mouth daily. Take with food., Disp: 90 tablet, Rfl: 3  ?  atorvastatin (LIPITOR) 40 mg tablet, TAKE (1) TABLET BY MOUTH ONCE DAILY, Disp: 90 tablet, Rfl: 1  ?  benzonatate (TESSALON PERLES) 100 mg capsule, Take 100 mg by mouth every 8 hours., Disp: , Rfl:   ?  budesonide-formoterol (SYMBICORT HFA) 160-4.5 mcg/actuation inhalation, Inhale two puffs by mouth into the lungs twice daily. Rinse mouth after use., Disp: 10.2 g, Rfl: 11  ?  bumetanide (BUMEX) 2 mg tablet, Take one tablet by mouth every 48 hours. Take extra dose for swelling, Disp: 180 tablet, Rfl: 3  ?  cholecalciferol (VITAMIN D-3) 1,000 units tablet, Take 1,000 Units by mouth daily., Disp: , Rfl:   ?  doxycycline (MONODOX) 100 mg capsule, Take one capsule by mouth daily., Disp: 90 capsule, Rfl: 5  ?  duloxetine DR (CYMBALTA) 60 mg capsule, Take one capsule by mouth daily., Disp: 90 capsule, Rfl: 3  ?  empagliflozin (JARDIANCE) 10 mg tablet, Take one tablet by mouth daily., Disp: 90 tablet, Rfl: 3  ?  finasteride (PROSCAR) 5 mg tablet, Take one tablet by mouth daily., Disp: 90 tablet, Rfl: 3  ?  fish oil- omega 3-DHA/EPA 300/1,000 mg capsule, Take 1 capsule by mouth daily., Disp: , Rfl:   ?  gabapentin (NEURONTIN) 400 mg capsule, Take 1 capsule by mouth three times daily as needed., Disp: , Rfl:   ?  ipratropium (ATROVENT) 0.03 % nasal spray, Apply two sprays to each nostril as directed every 12 hours., Disp: 30 mL, Rfl: 3  ?  levoFLOXacin (LEVAQUIN) 750 mg tablet, Take one tablet by mouth daily.,  Disp: 7 tablet, Rfl: 0  ?  Miscellaneous Medical Supply misc, Rx: Please wrap legs with ACE bandage from toes as high up to the leg as possible bilaterally Dx: Lymphedema, Disp: 2 each, Rfl: 0  ?  montelukast (SINGULAIR) 10 mg tablet, Take one tablet by mouth at bedtime daily., Disp: 90 tablet, Rfl: 1  ?  nitroglycerin (NITROSTAT) 0.4 mg tablet, Place 0.4 mg under tongue every 5 minutes as needed for Chest Pain. Max of 3 tablets, call 911., Disp: , Rfl:   ?  pantoprazole DR (PROTONIX) 40 mg tablet, TAKE (1) TABLET BY MOUTH TWO TIMES A DAY., Disp: 60 tablet, Rfl: 5  ?  sodium chloride 3 % nebulizer solution, Inhale 4 mL by mouth into the lungs twice daily. Use with [place].  Indications: asthma, chronic cough, bronchiectasis, Disp: 60 each, Rfl: 11  ?  spironolactone (ALDACTONE) 25 mg tablet, Take one tablet by mouth twice daily with food., Disp: 180 tablet, Rfl: 3  ?  tamsulosin (FLOMAX) 0.4 mg capsule, Take one capsule by mouth daily., Disp: 90 capsule, Rfl: 3  ?  traMADoL (ULTRAM) 50 mg tablet, Take one tablet by mouth every 12 hours as needed for Pain., Disp: 60 tablet, Rfl: 0  ?  zinc sulfate 220 mg (50 mg elemental zinc) capsule, Take 220 mg by mouth daily., Disp: , Rfl:   Allergies   Allergen Reactions   ? Chlorhexidine BLISTERS and EDEMA     Physical Exam  Vitals:    11/06/19 1108   BP: 110/63   BP Source: Arm, Right Upper   Patient Position: Sitting   Resp: 20   Weight: 133.8 kg (295 lb)   Height: 180.3 cm (71)   PainSc: Ten        Pain Score: Ten (varies)  Body mass index is 41.14 kg/m?.    Gen: Alert & Oriented X 3  HEENT: EOMI  Neck: Supple, no elevated JVP  Heart: Extremities well perfused  Lungs: non labored breathing  Abdomen: Soft, non-tender, non-distended  Skin: Chronic vascular stasis changes in bilateral lower extremities  Ext: purposeful movement of extremities       LOWER EXTREMITIES  MS:   Root Right Left   Hip Flexion L2 5 5   Knee Flexion L5/S1 5 5   Knee Extension L3 5 5   Dorsiflexion L4 5 5   Plantarflexion S1 5 5   EHL Extension L5 5 5     Gait was smooth and symmetric with equal arm swing.        + TTP L PSIS, bilateral GTBs. + TTP along the RT paramidline L1-L2 regions. TTP bilateral L4-5-S1 facets.   No tenderness to palpation along the spinous process,  paraspinal musculature,  gluteal musculature.     Full ROM bilateral lower extremities.      Negative slump testing.  Positive modified FABERs on the L.            IMPRESSION:  1. Lumbar post-laminectomy syndrome    2. Spondylolisthesis of lumbosacral region    3. Lumbar spondylosis      79 yo pleasant M with prior L4-S1 laminectomy presents with chronic lower back pain and right radicular pain.  Overall pain likely multifactorial?due to facet arthropathy, left SI arthropathy, right L4 radiculopathy.      PLAN:    1) Lifestyle modifications: Limit or modify activities that exacerbate pain.    2) Medications: Continue current medications.  3) Rehabilitation:  Continue with HEP.  4)  Consults/Referrals: Not indicated at this time.   5) Imaging/Interventions:  Will obtain updated x-rays at this time. Plan on RFA to bilateral L3, L4, L5 MBB's.  If no improvement noted will consider CT of the lumbar spine.  MRI contraindicated given history of ICD placement.  Left SI joint injection or ESIs may be considered in the future.  6) Follow up: F/u for procedure.     ATTESTATION    I personally performed the key portions of the E/M visit, discussed case with fellow and concur with fellow documentation of history, physical exam, assessment, and treatment plan unless otherwise noted.    Mr. Chervenak is an 79 year old male with history of cardiomyopathy, coronary artery disease, obstructive sleep apnea, and chronic kidney disease, who presents for follow-up on back pain.  Patient was last seen in spring 2020, at which point we provided him with 2 medial branch blocks.  The first of provided 6 hours brief for at least 75% reduction in pain.  The second medial branch block provided 4 hours of relief with the head was sent benefit.  Subsequently he did not have radiofrequency ablation performed due to community outbreak of COVID-19.  Since that time he has had increasing pain in the low back.  He continues to have axial pain, but has developed some symptoms in the right leg as well as the left posterior lateral hip.  He denies neurologic changes.  Denies recent falls.  VAS pain score is rated as a 10/10.    On examination he demonstrates significant tenderness of bilateral L4-L5 and L5-S1 facet joints.  Facet loading is grossly positive.  He has some tenderness to the left PSIS, although less than facet joints.  FABER maneuver is positive on the left.  He is otherwise neurologically intact.  No upper motor neuron signs.    CT lumbar spine from 09/26/2016 was personally viewed and demonstrated lumbar laminectomy at L4-L5.  There is facet arthropathy worst at L4-5 and L5-S1.  There is multilevel central and neuroforaminal stenosis.  This is most significantly seen on the right at [facility], which is moderate in severity.    Mr. 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  I would recommend proceeding with lumbar radiofrequency ablation as he has had significant benefit in the past.  We discussed risk and benefits of procedure including pain, bleeding, infection, and damage nearby structures.  He would like to proceed.  We will perform without sedation.  We discussed continuing with gabapentin and tramadol.  I would avoid adjusting the medication at this time.  We will get updated radiographs.  We will see him back for procedure.    Staff name: Curly Rim, MD  Date: 11/06/19

## 2019-11-20 ENCOUNTER — Encounter: Admit: 2019-11-20 | Discharge: 2019-11-20 | Payer: MEDICARE

## 2019-11-20 ENCOUNTER — Ambulatory Visit: Admit: 2019-11-20 | Discharge: 2019-11-21 | Payer: MEDICARE

## 2019-11-20 DIAGNOSIS — L03116 Cellulitis of left lower limb: Secondary | ICD-10-CM

## 2019-11-20 DIAGNOSIS — M954 Acquired deformity of chest and rib: Secondary | ICD-10-CM

## 2019-11-20 DIAGNOSIS — R0600 Dyspnea, unspecified: Secondary | ICD-10-CM

## 2019-11-20 DIAGNOSIS — Z9981 Dependence on supplemental oxygen: Secondary | ICD-10-CM

## 2019-11-20 DIAGNOSIS — M961 Postlaminectomy syndrome, not elsewhere classified: Secondary | ICD-10-CM

## 2019-11-20 DIAGNOSIS — K219 Gastro-esophageal reflux disease without esophagitis: Secondary | ICD-10-CM

## 2019-11-20 DIAGNOSIS — I10 Essential (primary) hypertension: Secondary | ICD-10-CM

## 2019-11-20 DIAGNOSIS — I714 Abdominal aortic aneurysm, without rupture: Secondary | ICD-10-CM

## 2019-11-20 DIAGNOSIS — J479 Bronchiectasis, uncomplicated: Secondary | ICD-10-CM

## 2019-11-20 DIAGNOSIS — N183 CKD (chronic kidney disease) stage 3, GFR 30-59 ml/min (HCC): Secondary | ICD-10-CM

## 2019-11-20 DIAGNOSIS — J189 Pneumonia, unspecified organism: Secondary | ICD-10-CM

## 2019-11-20 DIAGNOSIS — Z8679 Personal history of other diseases of the circulatory system: Secondary | ICD-10-CM

## 2019-11-20 DIAGNOSIS — I5042 Chronic combined systolic (congestive) and diastolic (congestive) heart failure: Secondary | ICD-10-CM

## 2019-11-20 DIAGNOSIS — J302 Other seasonal allergic rhinitis: Secondary | ICD-10-CM

## 2019-11-20 DIAGNOSIS — N401 Enlarged prostate with lower urinary tract symptoms: Secondary | ICD-10-CM

## 2019-11-20 DIAGNOSIS — E782 Mixed hyperlipidemia: Secondary | ICD-10-CM

## 2019-11-20 DIAGNOSIS — I2510 Atherosclerotic heart disease of native coronary artery without angina pectoris: Secondary | ICD-10-CM

## 2019-11-20 DIAGNOSIS — I429 Cardiomyopathy, unspecified: Secondary | ICD-10-CM

## 2019-11-20 DIAGNOSIS — G4733 Obstructive sleep apnea (adult) (pediatric): Secondary | ICD-10-CM

## 2019-11-20 DIAGNOSIS — Z95828 Presence of other vascular implants and grafts: Secondary | ICD-10-CM

## 2019-11-20 DIAGNOSIS — R053 Chronic cough: Secondary | ICD-10-CM

## 2019-11-20 DIAGNOSIS — J45909 Unspecified asthma, uncomplicated: Secondary | ICD-10-CM

## 2019-11-20 DIAGNOSIS — R609 Edema, unspecified: Secondary | ICD-10-CM

## 2019-11-20 DIAGNOSIS — J449 Chronic obstructive pulmonary disease, unspecified: Secondary | ICD-10-CM

## 2019-11-20 DIAGNOSIS — I509 Heart failure, unspecified: Secondary | ICD-10-CM

## 2019-11-20 DIAGNOSIS — Z8614 Personal history of Methicillin resistant Staphylococcus aureus infection: Secondary | ICD-10-CM

## 2019-11-20 MED ORDER — CEPHALEXIN 500 MG PO CAP
500 mg | ORAL_CAPSULE | Freq: Four times a day (QID) | ORAL | 0 refills | Status: AC
Start: 2019-11-20 — End: ?

## 2019-11-20 NOTE — Telephone Encounter
Spoke with [employer]. We have placed a referral for you to see Vascular.  If you do not hear from scheduling you can call [phone] to follow up on your referral status and schedule an appointment. I scheduled pt an appt for tomorrow, 11/21/19, with Lauren for possible cellulitis. [name]     Charlaine Dalton, LPN

## 2019-11-20 NOTE — Telephone Encounter
Pt wife called inquiring about a referral to vascular center for pt and believes pt may have cellulitis again.     Charlaine Dalton, LPN

## 2019-11-20 NOTE — Telephone Encounter
Pts wife lvm saying that pt saw Dr. Chauncey Cruel today and will not be coming in on 10/1 for the urgent care appt with our NP. Dr. Clelia Croft gave pt an antibiotic for the cellulitis.     RN canceled appt per pt's request.   David Stall, RN

## 2019-11-20 NOTE — Progress Notes
Daily CardioMEMS Readings    Goal PA Diastolic: 11-13 mmHg   PA Diastolic Thresholds: 9-15 mmHg

## 2019-11-24 NOTE — Progress Notes
Date of Service: 11/25/2019    Ruben Wood. Ruben Wood is a 79 y.o. male.   He is followed by Dr. Dickie La and Dr. Elizabeth Palau.    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    He had also previously been following with Dr. Vita Barley in the weight reduction clinic but since not following the diet has regained much of the weight. There have been several discussions about this patient and the potential for using immunoglobulin replacement therapy due to persistent hypogammaglobulinemia.     He did see Dr. Harley Alto in an annual visit regarding his abdominal aortic aneurysm on?01/15/19 with a CT of the abdomen the same day revealing aneurysmal sac size continues to decrease, now measuring 4.2?cm, previously 5 cm one year ago. ?He asked that the patient return in 1 year with repeat noncontrast CT of the abdomen and pelvis.    He has had 2 recent hospital admissions. ?The first was from 10/14?12/15/2018 with acute respiratory failure and hypoxia as a result of COVID-19 infection. ?He was followed by ID and completed convalescent plasma therapy, remdesivir, and 10 days of dexamethasone. ?He was discharged to SNF on 10/25. ?His midodrine was discontinued during this hospitalization. ?Unfortunately he required readmission?to [facility] 10/30?12/26/2018 with dyspnea/hypoxic respiratory failure. ?Notably he had missed several days of diuretic while in a inpatient SNF?the week prior. ?He was treated in the MICU with IV diuresis, ceftriaxone for suspected UTI, and vancomycin for left lower extremity cellulitis. 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 ?He did have lower extremity arterial evaluation during his stay. ?He was also followed by the cardiology team in regard to his heart failure. ?He was discharged with Bumex 2 mg daily and spironolactone 25 mg twice daily.  ?He had COVID infection recently which took a heavy toll on his health.?  Also lately has been battling with pneumonia and has been on long-term antibiotic course.  Unfortunately [place] also fell and injured his left knee.  He continues to be short of breath and is needing more oxygen which is concerning to me.    He was seen by Dr. Sherryll Burger on 11/20/2019. His blood pressure was 110/62?mmHg and heart rate 93?bpm with saturating 95% on oxygen. ?His weight was 302 pounds compared to 273 when I saw him last time.   There was discussion about his echo change 04/2019 in which his EF has dropped down to 30% (previously  50%).  He is needing more oxygen and his weight is up at least 30-40 pounds.  Dr. Sherryll Burger is concerned that he may have worsening mitral regurgitation.  After discussion it was decided that he will need a right heart catheterization with echocardiogram if he continues to having high oxygen requirement, weight gain and no improvement in his overall health I think better strategy would be to get him admitted for further work-up. There is also concerned that with his CardioMEMS there is a discrepancy between the clinical status and  numbers obtained.    He returns today for follow-up,, accompanied by his wife.  He reports his symptoms have continued to worsen.  His weight is now up with additional 2 pounds.  He is short of breath with minimal exertion and occasionally at rest (he reports shortness of breath getting worse every day).  He reports having an increased cough as well. He is wearing his oxygen continuously.  He is sleeping in the recliner for both his back and breathing.  He reports dizziness with minimal movement.  He has swelling in his lower extremity and cellulitis that he states is worsening.  He also has abdominal bloating.  He also reports having an ulcer on his buttocks that is getting worse.  He is using his CPAP at night.  He denies paroxysmal nocturnal dyspnea, chest pain, irregular heartbeat/palpitations,  and/or syncope/near syncope.      His CardioMEMS readings were reviewed today.  His PAD has been 12-15 mmHg (goal PAD at this time is 12-13 mmHg and PAD threshold is 10-15 mmHg).  These readings are similar to what he has been running the concern that there may be a discrepancy given his clinical condition which is deteriorating.    We discussed that with his worsening symptoms we would proceed with the plan for admission along with echocardiogram and right heart catheterization.  He and his wife agree.  He has a CPAP with him.         Vitals:    11/25/19 1134   BP: 135/64   BP Source: Arm, Right Upper   Patient Position: Sitting   Pulse: 54   SpO2: 94%   Weight: (!) 138 kg (304 lb 3.2 oz)   Height: 1.803 m (5' 11)   PainSc: Zero     Body mass index is 42.43 kg/m?[name]     Past Medical History  Patient Active Problem List    Diagnosis Date Noted   ? Complex care coordination 05/04/2017     Priority: High     Class: Acute   ? Acute on chronic combined systolic (congestive) and diastolic (congestive) heart failure (HCC) 11/25/2019   ? Chronic bronchitis (HCC) 11/25/2019   ? Pneumonia due to COVID-19 virus 12/05/2018   ? Chronic bronchitis with acute exacerbation (HCC) 12/05/2018   ? COVID-19 virus infection 12/05/2018   ? 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 Type 2 diabetes mellitus with hyperglycemia, without long-term current use of insulin (HCC) 03/12/2018 ? Atrial fibrillation (HCC) 03/12/2018   ? Morbid obesity (HCC) 11/16/2017   ? Chronic respiratory failure with hypercapnia (HCC) 11/16/2017   ? Depression 11/15/2017   ? OAB (overactive bladder)      Urinary frequency, urgency, urge urinary incontinence (UUI), nocturia, nocturnal enuresis.  -- trial Mirabegron 25 mg --> improved, but persistent sx's.  -- trial Mirabegron 50 mg.     ? Urinary incontinence, urge      See OAB A&P note.     ? Coronary artery disease due to lipid rich plaque 06/29/2017   ? Bronchiectasis with acute lower respiratory infection (HCC) 05/10/2017   ? 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     ? 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  D/c'd Tamsulosin d/t lack of symptom improvement.     ? Enrolled  in chronic care management 02/28/2017   ? History of elevated prostate specific antigen (PSA)      PNBx (03/01/2017): (L) high-grade prostatic intraepithelial neoplasia (HG PIN), 1/6 cores; [name], PA-C.     ? Spondylolisthesis, lumbar region 12/13/2016   ? Lumbar post-laminectomy syndrome      L4-5     ? Spinal stenosis of lumbosacral region 09/20/2016   ? Spondylolisthesis of lumbosacral region 09/20/2016   ? Osteoarthritis of spine with radiculopathy, lumbosacral region 09/20/2016   ? 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 He had a normal response to pneumococca vaccination; he had 17/23 positive serotypes. He has normal tetanus toxoid Ab and CH50.  His T&B cell panel revealed a mildly low B-cell count at 68 right after a time of acute illness.  He had positive diphtheria antibody.    Likely multifactorial with significant comorbidities including frequent steroid use for bronchiectasis exacerbations, frequent infections, and possibly a nutritional component (low total protein).    Currently on doxycycline once daily for recurrent infections, which he feels has been tremendously helpful.     Plan:  - Continue doxycycline 100 mg daily  - Recommend getting the pneumovax immunization next time he is in clinic in person and will need repeat pneumococcal antibody levels 1 month afterwards once he has had resolution of his COVID issues.          ? Moderate persistent asthma with acute exacerbation 02/25/2016     He has had asthma since sometime in his 20-30s. He gets cough, chest tightness, wheezing, shortness of breath that is year round with worsening in the Spring and Fall.   Triggers: URI, hot/cold air. Additionally he has been diagnosed with bronchiectasis and emphysema; his PFTs suggest both obstructive and restrictive disease and asthma is unlikely to be the sole cause of his dyspnea.    He has negative IgE immunocaps to aeroallergens which makes the possibility of allergic asthma highly unlikely. His IgE level was 17 and he had negative ANCAs, MPO and PR3.     Repeatedly being treated with steroids now for flares.  Also on hypertonic saline 3 times a day although not currently using them), DuoNebs, Symbicort, and Singulair as well as aggressive pulmonary clearance with vest and Aerobika.        ? 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 He is not having issues with urticaria.    - We recommend IgE immunocaps to aeroallergens.      ? Recurrent infections 02/25/2016     Recurrent sinopulmonary infections requiring antibiotics 5-10 times per year.   Cellulitis and non-healing wounds.   Many underlying illnesses predisposing him to these infections in addition to intermittent hypogammaglobulinemia associated with active infections and systemic steroids.      ? S/P left pulmonary artery pressure sensor implant placement (CardioMEMs)  02/02/2016     * Patient has CardioMEMs (Pulmonary Artery Pressure Sensor)* Please call Matthew Folks, CardioMEMs Program Coordinator [phone] or page Heart Failure Rounding Team if patient is admitted or presents to ED*  03/30/15 implant     ? Ischemic cardiomyopathy 01/12/2016   ? 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 ICD (implantable cardioverter-defibrillator), biventricular, in situ 12/07/2015   ? Hypercholesterolemia 11/25/2015   ?  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 Bronchiectasis without complication (HCC) 06/18/2015     Non-sputum producer.  Currently on Symbicort.  - Dr. [name] was able to get him a vest to wear at home for secretions and this has helped immensely with his secretions and breathing.      ? Osteoarthritis of spine with radiculopathy, lumbar region 07/28/2014   ? Chronic combined systolic and diastolic congestive heart failure, NYHA class 2 (HCC) 07/19/2014     11/04/14: EF 20%, severely dilated LV with grade 1 diastolic dysfunction.  11/12/2014 In clinic today, patient appears fairly well compensated.  He does have some LEE, but no significant rales (just some at bases), no JVD sitting upright, and no sx to suggest decompensation.  We will continue metoprolol, spironolactone, and bumex at current doses.    11/17/2014 Edema much improved in lower ext, Cr trending down on last labs.  Will recheck labs today.  Advised to weigh himself daily.  Rechecking BMP today to ensure not overdoing diuretics.     On metoprolol, spironolactone.  Will need to explore allergy to ARB more as patient may benefit from ACEI.      03/30/15 CardioMEMS implant by Dr. Chales Abrahams  1. Normal cardiac output and cardiac index.  2. Normal pulmonary pressures and normal pulmonary capillary wedge pressure.  3. Successful insertion of CardioMEMS pulmonary artery pressure sensor     ? 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      ? Mixed dyslipidemia 08/26/2013     Hepatic Function    Lab Results   Component Value Date/Time    ALBUMIN 4.0 11/04/2014 12:26 AM    TOTAL PROTEIN 6.9 11/04/2014 12:26 AM    ALK PHOSPHATASE 61 11/04/2014 12:26 AM    Lab Results   Component Value Date/Time    AST (SGOT) 41* 11/04/2014 12:26 AM    ALT (SGPT) 19 11/04/2014 12:26 AM    TOTAL BILIRUBIN 1.4* 11/04/2014 12:26 AM        Lab Results   Component Value Date    CHOL 148 12/15/2013    TRIG 122 12/15/2013    HDL 51 12/15/2013    LDL 88 12/15/2013    VLDL 24 12/15/2013    NONHDLCHOL 97 12/15/2013       BP Readings from Last 3 Encounters:   11/12/14 129/80   11/09/14 111/44   11/04/14 146/65     Plan:   79 y.o. with known CAD with CTO of RCA and obtuse marginal branch with high grade stenosis (90%) in mid left circ.    Advised heart healthy diet and daily exercise.    Continue high intensity statin, atorvastatin       ? AAA (abdominal aortic aneurysm) (HCC) 10/15/2012     Duplex done at OSH in  2007 and 2008 ~ 4.1 cm    Duplex 10/15/12-AAA 7.3 cm AP x 7.3 cm       ? History of MRSA infection 10/14/2012   ? CAD (coronary artery disease), native coronary artery 08/15/2012     07/11/2002- Cath @ [facility] showed Severe double vessel disease(OMB of circumflex and distal circ)  inferior-basilar dyskinesis.                  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    09/12/12   Cath - [facility]:  CTO of the right coronary artery, CTO of the obtuse marginal branch and high-grade stenosis of   90% in the mid left circumflex artery No significant stenosis in the left anterior descending artery or left main vessel     Failed attempt in opening 2nd obtuse marginal CTO as described above.    10/14/12: Unsuccessful attempt to open CTO of OMB and mid-CFX by Dr. Mackey Birchwood      04/02/17: Cath by Dr. Steward Ros:   4. Chronic total occlusion of the circumflex.  5. Chronic total occlusion of the right coronary artery, status post percutaneous coronary intervention with drug-eluting stent times 2.  6. Small distal right coronary artery perforation without echo or hemodynamic evidence of compromise.    06/29/17-cardiac catheterization by Dr. Steward Ros. One DES placed to chronic total occlusion of the circumflex artery. The RCA stent was patent.     ? OSA on CPAP 08/13/2012     CPAP at  DME: [facility]    Split night:  08/14/2017  AHI 43.2  REM n/a  Time below 88 %: 98 mins  Lowest sat: 83%     CPAP 10 cm h20 effective      ? COPD (chronic obstructive pulmonary disease) (HCC) 08/13/2012     04/28/13: PFTs with more restrictive pattern with FEV1 50%, FEV1/FVC 89%, DLCO 65%.    12/27/13: PFTs with FEV1 82%, FEV1/FVC 113%, and DLCO 77%     ? 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  Discussed the importance of diet.  Exercise as tolerated.        ? Essential hypertension 08/13/2012   ? Chest wall deformity 07/09/2012     Chest wall reconstruction on 07/09/12  Lung herniation- bio bridge           Review of Systems   Constitutional: Positive for malaise/fatigue and weight gain.   HENT: Positive for congestion.    Eyes: Negative.    Cardiovascular: Positive for dyspnea on exertion and leg swelling.   Respiratory: Positive for cough.    Endocrine: Negative.    Hematologic/Lymphatic: Negative.    Skin: Negative.    Gastrointestinal: Positive for bloating.   Genitourinary: Negative.    Neurological: Positive for dizziness.   Psychiatric/Behavioral: Negative.    Allergic/Immunologic: Negative.        Physical Exam  Constitutional: No acute distress  HENT:  Head - normocephalic   Eyes: Conjunctivae normal  Neck: Difficult to assess due to body habitus  Cardiac Rhythm: Normal rate,  regular rhythm  Cardiac Ausculation:  S1/S2 normal, no definitive S3 or S4, no gallop or friction rub  Murmurs: Murmur heard  Lower Extremity Edema: Nonpitting lower extremity edema, skin reddened.  Pulmonary/Chest: Course lung sounds, coughs with each deep breath, appears to be having some distress after coughing - slow to recover, wearing oxygen  Abdominal: Soft, round, non-tender, no obvious masses, bowel sounds present  Musculoskeletal: Moves all extremities, sitting in wheelchair  Skin: Warm / dry, no erythema   Neurological: Alert and oriented to person, place, and time; no focal deficits  Psychiatric: Normal mood and affect; judgment, behavior, and thought content normal  Language and Memory: patient responsive and seems to comprehend information   Vital signs reviewed    Cardiovascular Studies  05/16/19 echocardiogram  1.  Slightly dilated left ventricle.  Moderately depressed function with ejection fraction=30%.  Mild diastolic dysfunction with normal filling pressure.  2.  Moderate mitral regurgitation.  3.  Normal pulmonary artery pressure.  4.  Slightly dilated right ventricle.  Normal ejection fraction.  Mild tricuspid regurgitation.    06/2017 heart catheterization with PCI  CORONARY ARTERY ANATOMY:    1. Left main originates from the left coronary cusp and bifurcates into LAD and left circumflex.  There was 30% distal left main area stenosis that appeared to be with circumferential calcium noted on the OCT, and catheter dampening using the 6-French guide.  The left main bifurcates into LAD and left circumflex.  2. The LAD has mild 20 to 30 percent proximal vessel disease otherwise no significant disease.  It is a type 3 LAD that wraps around the apex and gave rise to multiple septal branches, and gave rise to 2 diagonal branches with no significant disease.  3. The left circumflex artery gave rise to 2 OM branches.  The OM 2 has 100% chronic total occlusion of the proximal vessel, now with 20 to 30 percent disease in the proximal mid left circumflex.  These are now status post balloon angioplasty with drug-eluting stent 2.5 x 38 Xience Sierra postdilated with a 3.5 NC balloon proximally and a 3.0 NC balloon in the distal stent, with reduction of stenosis to 0% and TIMI-3 flow distally.  4. Right coronary artery originates from the right coronary cusp and bifurcates into the PDA and posterolateral branch.  There was a patent stent in the proximal mid RCA.  ?CONCLUSION:    1. A 100% chronic total occlusion of the left circumflex obtuse marginal-2, now status post balloon angioplasty and drug-eluting stent 2.5 x 38 Xience Sierra, postdilated with a 3.0 NC balloon distally and a 3.5 NC balloon proximally.  2. Patent stent in the proximal mid RCA.  3. A 30% distal left main area stenosis with circumferential calcium noted on the OCT.  4. Hemostasis achieved with Angio-Seal device in the right common femoral artery access.  ?RECOMMENDATIONS:  The patient was reloaded with 300 mg of Plavix.  Patient to continue aspirin and Plavix for at least 6 months from now in addition to high-intensity statin beta blocker.  Plan above discussed with Dr. Steward Ros, interventional cardiology attending.  ?  I was present during the entire procedure. I assisted and supervised the fellow's action and agree with the assessment and recommendations as outlined above.  Cardiovascular Health Factors  Vitals BP Readings from Last 3 Encounters:   11/25/19 135/64   11/06/19 110/63   10/21/19 124/80     Wt Readings from Last 3 Encounters:   11/25/19 (!) 138 kg (304  lb 3.2 oz)   11/20/19 (!) 137 kg (302 lb)   11/06/19 133.8 kg (295 lb)     BMI Readings from Last 3 Encounters:   11/25/19 42.43 kg/m?   11/20/19 42.12 kg/m?   11/06/19 41.14 kg/m?      Smoking Social History     Tobacco Use   Smoking Status Former Smoker   ? Packs/day: 2.00   ? Years: 40.00   ? Pack years: 80.00   ? Types: Cigarettes   ? Quit date: 07/09/1998   ? Years since quitting: 21.3   Smokeless Tobacco Never Used      Lipid Profile Cholesterol   Date Value Ref Range Status   12/05/2018 106 <200 MG/DL Final     HDL   Date Value Ref Range Status   12/05/2018 31 (L) >40 MG/DL Final     LDL   Date Value Ref Range Status   12/05/2018 60 <100 mg/dL Final     Triglycerides   Date Value Ref Range Status   12/05/2018 108 <150 MG/DL Final      Blood Sugar Hemoglobin A1C   Date Value Ref Range Status   09/02/2019 5.9 4.0 - 6.0 % Final     Comment:     The ADA recommends that most patients with type 1 and type 2 diabetes maintain   an A1c level <7%.       Glucose   Date Value Ref Range Status   10/21/2019 103 (H) 70 - 100 MG/DL Final   16/11/9602 540 (H) 70 - 100 MG/DL Final   98/12/9145 829 (A) 65 - 110 Final     Glucose Fasting   Date Value Ref Range Status   12/15/2014 111  Final     Glucose, POC   Date Value Ref Range Status   12/25/2018 202 (H) 70 - 100 MG/DL Final   56/21/3086 578 (H) 70 - 100 MG/DL Final   46/96/2952 841 (H) 70 - 100 MG/DL Final          Problems Addressed Today  Encounter Diagnoses   Name Primary?   ? Chronic combined systolic and diastolic congestive heart failure (HCC) Yes   ? Ischemic cardiomyopathy    ? Coronary artery disease involving native coronary artery of native heart without angina pectoris    ? ICD (implantable cardioverter-defibrillator) in place    ? Nonrheumatic mitral valve regurgitation    ? Essential hypertension    ? Obstructive sleep apnea    ? Chronic ulcer of buttock (HCC)        Assessment and Plan     Acute on Chronic Combined Systolic and Diastolic Heart Failure:    -Echo on 05/16/2019 showed an EF of 30%.  -GDMT: Spironolactone 25 mg twice daily, Jardiance 10 mg daily, Bumex 2 mg daily (losartan and Toprol-XL discontinued due to orthostatic hypotension)  -His CardioMEMS readings were reviewed today.  His PAD has been 12-15 mmHg (goal PAD at this time is 12-13 mmHg and PAD threshold is 10-15 mmHg).  These readings are similar to what he has been running the concern that there may be a discrepancy given his clinical condition which is deteriorating.  -Today, he describes NYHA Functional Class III-IV symptoms, appears hypervolemic by exam.  He was seen by Dr. Sherryll Burger on 9/30.  He has had overall 30-40 pound weight gain and increased use of oxygen.  There is concerned that this may be worsening mitral valve and/or heart failure.  It was discussed  that if his condition continues to worsen he would be admitted for a right heart catheterization and echocardiogram.  He reports that his shortness of breath is getting worse every day.  He has now developed a cough that is making it more difficult to breathe.  His weight is up another 2 pounds from last week.  Given all this we will plan to admit him today.  He and his wife are agreeable.    Ischemic Cardiomyopathy:    -GDMT as described above.  -02/2018 PYP scan negative for ATTR amyloidosis.  His 3.5 and lambda 3.64.    Coronary Artery Disease:  -History of CTO PCI 03/2017, 06/2017  -He s not describing any anginal type symptoms, continue on aspirin and Lipitor.    ICD in situ:  -Last device check 12/23/2018, he will need a device check during his admission.    Mitral Regurgitation:  -Echo from 05/16/2019 shows moderate mitral regurgitation.  -As stated above he has had increased shortness of breath with minimal exertion and volume overload.  Will admit for further evaluation.    Hypertension:  -Controlled on current regimen, continue to monitor.    Chronic Kidney Disease - Stage III:  -Most recent creatinine on 10/03/2019 was 1.68 (baseline appears to be 1.2?1.6).    Obstructive Sleep Apnea:  -Using CPAP with good compliance.  He has brought his CPAP machine with him in anticipation for admission.    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  He will need consultation by wound team during his admission.    Follow Up Visit: Admit today for evaluation of his cardiac function and progression of mitral regurgitation.      I have personally documented the HPI, exam and medical decision making.  Patient education: I reviewed recent lab results and current medications, medication instructions, discussed heart failure signs & symptoms,  low  sodium diet, fluid restriction and daily weights. I have instructed the patient on the plan of care and they verbalize understanding of the plan. Please see AVS for full patient teaching. Patient advised to call our office if s/he has any problems, questions, worsening symptoms, or concerns prior to the next appointment.         Total time 40 minutes.  Estimated counseling time 25 minutes.     Thank you for allowing me to participate in the care of this patient. If you have any questions please do not hesitate to contact our office.      Justus Memory, DNP, APRN, NP-C  Collaborating Physician: [name]  [facility]  Phone: [phone] Fax: [phone]         Current Medications (including today's revisions)  ? acetaminophen (TYLENOL) 325 mg tablet Take two tablets by mouth every 6 hours as needed for Pain.   ? albuterol 0.083% (PROVENTIL) 2.5 mg /3 mL (0.083 %) nebulizer solution Inhale 3 mL solution by nebulizer as directed every 6 hours as needed for Wheezing or Shortness of Breath. Indications: asthma   ? albuterol sulfate (PROAIR HFA) 90 mcg/actuation aerosol inhaler Inhale two puffs by mouth into the lungs every 4 hours as needed for Wheezing or Shortness of Breath.   ? allopurinoL (ZYLOPRIM) 300 mg tablet Take 300 mg by mouth daily. Take with food.   ? amitriptyline/gabapentin/emu oil(#) (AMITRIPTYLINE/GABAPENTIN/EMU OIL(#)) 05/24/08 % Apply  topically to affected area.   ? ascorbic  acid-ascorbate sodium 500 mg chew Chew  by mouth.   ? aspirin EC 81 mg tablet Take one tablet by mouth daily. Take with food.   ? atorvastatin (LIPITOR) 40 mg tablet TAKE (1) TABLET BY MOUTH ONCE DAILY   ? benzonatate (TESSALON PERLES) 100 mg capsule Take 100 mg by mouth every 8 hours.   ? budesonide-formoterol (SYMBICORT HFA) 160-4.5 mcg/actuation inhalation Inhale two puffs by mouth into the lungs twice daily. Rinse mouth after use.   ? bumetanide (BUMEX) 2 mg tablet Take one tablet by mouth every 48 hours. Take extra dose for swelling (Patient taking differently: Take 2 mg by mouth daily. Take extra dose for swelling)   ? cephalexin (KEFLEX) 500 mg capsule Take one capsule by mouth four times daily.   ? cholecalciferol (VITAMIN D-3) 1,000 units tablet Take 1,000 Units by mouth daily.   ? duloxetine DR (CYMBALTA) 60 mg capsule Take one capsule by mouth daily.   ? empagliflozin (JARDIANCE) 10 mg tablet Take one tablet by mouth daily.   ? finasteride (PROSCAR) 5 mg tablet Take one tablet by mouth daily.   ? fish oil- omega 3-DHA/EPA 300/1,000 mg capsule Take 1 capsule by mouth daily.   ? gabapentin (NEURONTIN) 400 mg capsule Take 1 capsule by mouth three times daily as needed.   ? ipratropium (ATROVENT) 0.03 % nasal spray Apply two sprays to each nostril as directed every 12 hours.   ? levoFLOXacin (LEVAQUIN) 750 mg tablet Take one tablet by mouth daily.   ? Miscellaneous Medical Supply misc Rx: Please wrap legs with ACE bandage from toes as high up to the leg as possible bilaterally  Dx: Lymphedema   ? montelukast (SINGULAIR) 10 mg tablet Take one tablet by mouth at bedtime daily.   ? nitroglycerin (NITROSTAT) 0.4 mg tablet Place 0.4 mg under tongue every 5 minutes as needed for Chest Pain. Max of 3 tablets, call 911.   ? pantoprazole DR (PROTONIX) 40 mg tablet TAKE (1) TABLET BY MOUTH TWO TIMES A DAY.   ? sodium chloride 3 % nebulizer solution Inhale 4 mL by mouth into the lungs twice daily. Use with [place].  Indications: asthma, chronic cough, bronchiectasis   ? spironolactone (ALDACTONE) 25 mg tablet Take one tablet by mouth twice daily with food.   ? tamsulosin (FLOMAX) 0.4 mg capsule Take one capsule by mouth daily.   ? traMADoL (ULTRAM) 50 mg tablet Take one tablet by mouth every 12 hours as needed for Pain.   ? zinc sulfate 220 mg (50 mg elemental zinc) capsule Take 220 mg by mouth daily.

## 2019-11-25 ENCOUNTER — Inpatient Hospital Stay: Admit: 2019-11-25 | Discharge: 2019-11-25 | Payer: MEDICARE

## 2019-11-25 ENCOUNTER — Encounter: Admit: 2019-11-25 | Discharge: 2019-11-25 | Payer: MEDICARE

## 2019-11-25 ENCOUNTER — Inpatient Hospital Stay: Admit: 2019-11-25 | Payer: MEDICARE

## 2019-11-25 ENCOUNTER — Ambulatory Visit: Admit: 2019-11-25 | Discharge: 2019-11-25 | Payer: MEDICARE

## 2019-11-25 DIAGNOSIS — G4733 Obstructive sleep apnea (adult) (pediatric): Secondary | ICD-10-CM

## 2019-11-25 DIAGNOSIS — Z8614 Personal history of Methicillin resistant Staphylococcus aureus infection: Secondary | ICD-10-CM

## 2019-11-25 DIAGNOSIS — J189 Pneumonia, unspecified organism: Secondary | ICD-10-CM

## 2019-11-25 DIAGNOSIS — J449 Chronic obstructive pulmonary disease, unspecified: Secondary | ICD-10-CM

## 2019-11-25 DIAGNOSIS — R053 Chronic cough: Secondary | ICD-10-CM

## 2019-11-25 DIAGNOSIS — Z95828 Presence of other vascular implants and grafts: Secondary | ICD-10-CM

## 2019-11-25 DIAGNOSIS — L98419 Non-pressure chronic ulcer of buttock with unspecified severity: Secondary | ICD-10-CM

## 2019-11-25 DIAGNOSIS — Z95810 Presence of automatic (implantable) cardiac defibrillator: Secondary | ICD-10-CM

## 2019-11-25 DIAGNOSIS — I255 Ischemic cardiomyopathy: Secondary | ICD-10-CM

## 2019-11-25 DIAGNOSIS — K219 Gastro-esophageal reflux disease without esophagitis: Secondary | ICD-10-CM

## 2019-11-25 DIAGNOSIS — I2510 Atherosclerotic heart disease of native coronary artery without angina pectoris: Secondary | ICD-10-CM

## 2019-11-25 DIAGNOSIS — I10 Essential (primary) hypertension: Secondary | ICD-10-CM

## 2019-11-25 DIAGNOSIS — I509 Heart failure, unspecified: Secondary | ICD-10-CM

## 2019-11-25 DIAGNOSIS — I340 Nonrheumatic mitral (valve) insufficiency: Secondary | ICD-10-CM

## 2019-11-25 DIAGNOSIS — J45909 Unspecified asthma, uncomplicated: Secondary | ICD-10-CM

## 2019-11-25 DIAGNOSIS — I714 Abdominal aortic aneurysm, without rupture: Secondary | ICD-10-CM

## 2019-11-25 DIAGNOSIS — N401 Enlarged prostate with lower urinary tract symptoms: Secondary | ICD-10-CM

## 2019-11-25 DIAGNOSIS — I429 Cardiomyopathy, unspecified: Secondary | ICD-10-CM

## 2019-11-25 DIAGNOSIS — E782 Mixed hyperlipidemia: Secondary | ICD-10-CM

## 2019-11-25 DIAGNOSIS — R0600 Dyspnea, unspecified: Secondary | ICD-10-CM

## 2019-11-25 DIAGNOSIS — M954 Acquired deformity of chest and rib: Secondary | ICD-10-CM

## 2019-11-25 DIAGNOSIS — L03116 Cellulitis of left lower limb: Secondary | ICD-10-CM

## 2019-11-25 DIAGNOSIS — Z9981 Dependence on supplemental oxygen: Secondary | ICD-10-CM

## 2019-11-25 DIAGNOSIS — I5042 Chronic combined systolic (congestive) and diastolic (congestive) heart failure: Secondary | ICD-10-CM

## 2019-11-25 DIAGNOSIS — I5043 Acute on chronic combined systolic (congestive) and diastolic (congestive) heart failure: Secondary | ICD-10-CM

## 2019-11-25 DIAGNOSIS — J479 Bronchiectasis, uncomplicated: Secondary | ICD-10-CM

## 2019-11-25 DIAGNOSIS — Z8679 Personal history of other diseases of the circulatory system: Secondary | ICD-10-CM

## 2019-11-25 DIAGNOSIS — N183 CKD (chronic kidney disease) stage 3, GFR 30-59 ml/min (HCC): Secondary | ICD-10-CM

## 2019-11-25 DIAGNOSIS — J302 Other seasonal allergic rhinitis: Secondary | ICD-10-CM

## 2019-11-25 DIAGNOSIS — M961 Postlaminectomy syndrome, not elsewhere classified: Secondary | ICD-10-CM

## 2019-11-25 DIAGNOSIS — R609 Edema, unspecified: Secondary | ICD-10-CM

## 2019-11-25 LAB — CBC AND DIFF
Lab: 101 FL — ABNORMAL HIGH (ref 80–100)
Lab: 14 % (ref 11–15)
Lab: 225 10*3/uL (ref 150–400)
Lab: 32 % — ABNORMAL LOW (ref 40–50)
Lab: 34 g/dL (ref 32.0–36.0)
Lab: 34 pg — ABNORMAL HIGH (ref 26–34)
Lab: 6 10*3/uL (ref 4.5–11.0)

## 2019-11-25 LAB — COMPREHENSIVE METABOLIC PANEL
Lab: 1.2 mg/dL (ref 0.4–1.24)
Lab: 101 MMOL/L — ABNORMAL LOW (ref 98–110)
Lab: 140 MMOL/L (ref 137–147)
Lab: 16 U/L (ref 7–56)
Lab: 19 U/L (ref 7–40)
Lab: 24 mg/dL (ref 7–25)
Lab: 27 MMOL/L (ref 21–30)
Lab: 56 mL/min — ABNORMAL LOW (ref >60)
Lab: 6.6 g/dL — ABNORMAL LOW (ref 6.0–8.0)
Lab: 67 U/L (ref 25–110)
Lab: 9.3 mg/dL (ref 8.5–10.6)
Lab: 97 mg/dL (ref 70–100)
Lab: >60 mL/min (ref >60)
Lab: 12 (ref 3–12)

## 2019-11-25 LAB — IRON + BINDING CAPACITY + %SAT+ FERRITIN
Lab: 147 ng/mL (ref 30–300)
Lab: 323 ug/dL (ref 270–380)
Lab: 36 ug/dL — ABNORMAL LOW (ref 50–185)

## 2019-11-25 LAB — THYROID STIMULATING HORMONE-TSH: Lab: 1.7 uU/mL (ref 0.35–5.00)

## 2019-11-25 LAB — MAGNESIUM: Lab: 1.6 mg/dL — ABNORMAL LOW (ref 1.6–2.6)

## 2019-11-25 LAB — BNP (B-TYPE NATRIURETIC PEPTI): Lab: 263 pg/mL — ABNORMAL HIGH (ref 0–100)

## 2019-11-25 MED ORDER — FINASTERIDE 5 MG PO TAB
5 mg | Freq: Every day | ORAL | 0 refills | Status: AC
Start: 2019-11-25 — End: ?
  Administered 2019-11-26 – 2019-12-03 (×8): 5 mg via ORAL

## 2019-11-25 MED ORDER — DAPAGLIFLOZIN 5 MG PO TAB
10 mg | Freq: Every day | ORAL | 0 refills | Status: AC
Start: 2019-11-25 — End: ?
  Administered 2019-11-25 – 2019-12-03 (×9): 10 mg via ORAL

## 2019-11-25 MED ORDER — CEFAZOLIN INJ 1GM IVP
2 g | INTRAVENOUS | 0 refills | Status: AC
Start: 2019-11-25 — End: ?
  Administered 2019-11-25 – 2019-11-26 (×2): 2 g via INTRAVENOUS

## 2019-11-25 MED ORDER — ALBUTEROL SULFATE 2.5 MG /3 ML (0.083 %) IN NEBU
2.5 mg | RESPIRATORY_TRACT | 0 refills | Status: DC | PRN
Start: 2019-11-25 — End: 2019-11-25

## 2019-11-25 MED ORDER — ACETAMINOPHEN 325 MG PO TAB
650 mg | ORAL | 0 refills | Status: AC | PRN
Start: 2019-11-25 — End: ?

## 2019-11-25 MED ORDER — ENOXAPARIN 40 MG/0.4 ML SC SYRG
40 mg | Freq: Every day | SUBCUTANEOUS | 0 refills | Status: DC
Start: 2019-11-25 — End: 2019-11-25

## 2019-11-25 MED ORDER — ATORVASTATIN 40 MG PO TAB
40 mg | Freq: Every day | ORAL | 0 refills | Status: AC
Start: 2019-11-25 — End: ?
  Administered 2019-11-26 – 2019-12-02 (×7): 40 mg via ORAL

## 2019-11-25 MED ORDER — MAGNESIUM SULFATE IN D5W 1 GRAM/100 ML IV PGBK
1 g | INTRAVENOUS | 0 refills | Status: CP
Start: 2019-11-25 — End: ?
  Administered 2019-11-26: 01:00:00 1 g via INTRAVENOUS

## 2019-11-25 MED ORDER — SODIUM CHLORIDE 0.9 % TKO FLUID
INTRAVENOUS | 0 refills | Status: AC
Start: 2019-11-25 — End: ?
  Administered 2019-11-25 – 2019-11-30 (×2): via INTRAVENOUS

## 2019-11-25 MED ORDER — BUDESONIDE-FORMOTEROL 160-4.5 MCG/ACTUATION IN HFAA
2 | Freq: Two times a day (BID) | RESPIRATORY_TRACT | 0 refills | Status: AC
Start: 2019-11-25 — End: ?
  Administered 2019-11-25: 23:00:00 2 via RESPIRATORY_TRACT

## 2019-11-25 MED ORDER — EMU OIL 120ML
TOPICAL | 0 refills | Status: AC | PRN
Start: 2019-11-25 — End: ?
  Administered 2019-11-27: 19:00:00 120.000 mL via TOPICAL

## 2019-11-25 MED ORDER — BUMETANIDE 0.25 MG/ML IJ SOLN
2 mg | Freq: Once | INTRAVENOUS | 0 refills | Status: CP
Start: 2019-11-25 — End: ?
  Administered 2019-11-25: 22:00:00 2 mg via INTRAVENOUS

## 2019-11-25 MED ORDER — MAGNESIUM SULFATE IN D5W 1 GRAM/100 ML IV PGBK
1 g | INTRAVENOUS | 0 refills | Status: AC
Start: 2019-11-25 — End: ?

## 2019-11-25 MED ORDER — SODIUM CHLORIDE 3 % IN NEBU
4 mL | Freq: Two times a day (BID) | RESPIRATORY_TRACT | 0 refills | Status: AC
Start: 2019-11-25 — End: ?
  Administered 2019-11-26 – 2019-12-03 (×15): 4 mL via RESPIRATORY_TRACT

## 2019-11-25 MED ORDER — MAGNESIUM SULFATE IN D5W 1 GRAM/100 ML IV PGBK
1 g | INTRAVENOUS | 0 refills | Status: AC
Start: 2019-11-25 — End: ?
  Administered 2019-11-26: 03:00:00 1 g via INTRAVENOUS

## 2019-11-25 MED ORDER — SODIUM CHLORIDE 3 % IN NEBU
4 mL | Freq: Two times a day (BID) | RESPIRATORY_TRACT | 0 refills | Status: DC
Start: 2019-11-25 — End: 2019-11-25

## 2019-11-25 MED ORDER — VANCOMYCIN PHARMACY TO MANAGE
1 | 0 refills | Status: DC
Start: 2019-11-25 — End: 2019-11-25

## 2019-11-25 MED ORDER — SPIRONOLACTONE 25 MG PO TAB
25 mg | Freq: Every day | ORAL | 0 refills | Status: AC
Start: 2019-11-25 — End: ?
  Administered 2019-11-25 – 2019-12-03 (×8): 25 mg via ORAL

## 2019-11-25 MED ORDER — DULOXETINE 60 MG PO CPDR
60 mg | Freq: Every day | ORAL | 0 refills | Status: AC
Start: 2019-11-25 — End: ?
  Administered 2019-11-26 – 2019-12-03 (×8): 60 mg via ORAL

## 2019-11-25 MED ORDER — POTASSIUM CHLORIDE 20 MEQ PO TBTQ
20 meq | Freq: Once | ORAL | 0 refills | Status: CP
Start: 2019-11-25 — End: ?
  Administered 2019-11-25: 23:00:00 20 meq via ORAL

## 2019-11-25 MED ORDER — IPRATROPIUM BROMIDE 42 MCG (0.06 %) NA SPRY
2 | Freq: Two times a day (BID) | NASAL | 0 refills | Status: AC
Start: 2019-11-25 — End: ?
  Administered 2019-11-26: 01:00:00 2 via NASAL

## 2019-11-25 MED ORDER — MONTELUKAST 10 MG PO TAB
10 mg | Freq: Every evening | ORAL | 0 refills | Status: AC
Start: 2019-11-25 — End: ?
  Administered 2019-11-26 – 2019-12-03 (×8): 10 mg via ORAL

## 2019-11-25 MED ORDER — MELATONIN 5 MG PO TAB
5 mg | Freq: Every evening | ORAL | 0 refills | Status: AC | PRN
Start: 2019-11-25 — End: ?
  Administered 2019-11-26 – 2019-12-03 (×7): 5 mg via ORAL

## 2019-11-25 MED ORDER — ENOXAPARIN 40 MG/0.4 ML SC SYRG
40 mg | Freq: Two times a day (BID) | SUBCUTANEOUS | 0 refills | Status: AC
Start: 2019-11-25 — End: ?

## 2019-11-25 MED ORDER — PANTOPRAZOLE 40 MG PO TBEC
40 mg | Freq: Two times a day (BID) | ORAL | 0 refills | Status: AC
Start: 2019-11-25 — End: ?
  Administered 2019-11-26 – 2019-12-03 (×13): 40 mg via ORAL

## 2019-11-25 MED ORDER — MAGNESIUM SULFATE IN D5W 1 GRAM/100 ML IV PGBK
1 g | INTRAVENOUS | 0 refills | Status: CP
Start: 2019-11-25 — End: ?
  Administered 2019-11-25: 23:00:00 1 g via INTRAVENOUS

## 2019-11-25 MED ORDER — PERFLUTREN LIPID MICROSPHERES 1.1 MG/ML IV SUSP
1-20 mL | Freq: Once | INTRAVENOUS | 0 refills | Status: AC | PRN
Start: 2019-11-25 — End: ?

## 2019-11-25 MED ORDER — ALBUTEROL SULFATE 90 MCG/ACTUATION IN HFAA
2 | RESPIRATORY_TRACT | 0 refills | Status: AC | PRN
Start: 2019-11-25 — End: ?

## 2019-11-25 MED ORDER — POLYETHYLENE GLYCOL 3350 17 GRAM PO PWPK
1 | Freq: Two times a day (BID) | ORAL | 0 refills | Status: AC | PRN
Start: 2019-11-25 — End: ?
  Administered 2019-11-25 – 2019-12-01 (×5): 17 g via ORAL

## 2019-11-25 MED ORDER — VANCOMYCIN 1,500 MG IVPB
1500 mg | Freq: Two times a day (BID) | INTRAVENOUS | 0 refills | Status: DC
Start: 2019-11-25 — End: 2019-11-25

## 2019-11-25 MED ORDER — TAMSULOSIN 0.4 MG PO CAP
.4 mg | Freq: Every day | ORAL | 0 refills | Status: AC
Start: 2019-11-25 — End: ?
  Administered 2019-11-26 – 2019-12-03 (×8): 0.4 mg via ORAL

## 2019-11-25 MED ORDER — CEFEPIME IVPB
2 g | INTRAVENOUS | 0 refills | Status: DC
Start: 2019-11-25 — End: 2019-11-25

## 2019-11-25 MED ORDER — CEPHALEXIN 500 MG PO CAP
500 mg | Freq: Four times a day (QID) | ORAL | 0 refills | Status: DC
Start: 2019-11-25 — End: 2019-11-25

## 2019-11-25 MED ORDER — VANCOMYCIN 2,250 MG IVPB
2250 mg | Freq: Once | INTRAVENOUS | 0 refills | Status: DC
Start: 2019-11-25 — End: 2019-11-25

## 2019-11-25 MED ORDER — TRAMADOL 50 MG PO TAB
50 mg | Freq: Two times a day (BID) | ORAL | 0 refills | Status: AC | PRN
Start: 2019-11-25 — End: ?
  Administered 2019-11-26: 05:00:00 50 mg via ORAL

## 2019-11-25 MED ORDER — METHYLPREDNISOLONE SOD SUC(PF) 125 MG/2 ML IJ SOLR
125 mg | Freq: Once | INTRAVENOUS | 0 refills | Status: CP
Start: 2019-11-25 — End: ?
  Administered 2019-11-25: 22:00:00 125 mg via INTRAVENOUS

## 2019-11-25 MED ORDER — ASPIRIN 81 MG PO TBEC
81 mg | Freq: Every day | ORAL | 0 refills | Status: AC
Start: 2019-11-25 — End: ?
  Administered 2019-11-26 – 2019-12-03 (×7): 81 mg via ORAL

## 2019-11-25 MED ORDER — ALBUTEROL SULFATE 2.5 MG /3 ML (0.083 %) IN NEBU
2.5 mg | Freq: Two times a day (BID) | RESPIRATORY_TRACT | 0 refills | Status: AC | PRN
Start: 2019-11-25 — End: ?
  Administered 2019-11-26: 11:00:00 2.5 mg via RESPIRATORY_TRACT

## 2019-11-25 MED ORDER — NITROGLYCERIN 0.4 MG SL SUBL
.4 mg | SUBLINGUAL | 0 refills | Status: AC | PRN
Start: 2019-11-25 — End: ?

## 2019-11-25 NOTE — H&P (View-Only)
Heart Failure H&P Note    NAME:Ruben Wood.                                                                   MRN: [id number]                 DOB:04-08-1940          AGE: 79 y.o.  ADMISSION DATE: 11/25/2019             DAYS ADMITTED: LOS: 0 days      Chief Complaint:  Evaluation and recommendations re: heart failure.        History of Present Illness: Ruben Wood. is a 79 y.o. 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 There have been several discussions about this patient potentially using immunoglobulin replacement therapy due to persistent hypogammaglobulinemia.     He was seen in the HF clinic on 11/20/19 by Dr. Vanetta Shawl with increased weight and concerns for left lower extremity cellulitis.  He was started on Keflex 500 mg every 6 hours.  He followed up in heart failure clinic on 11/25/19 with concern for worsening hypervolemia and cellulitis and hospital admission was recommended.  He currently complains of fatigue, shortness of breath, dyspnea on exertion, orthopnea, abdominal fullness, peripheral edema and weight change. He currently denies palpitations, chest pain and muscle cramping.    Principal Problem:    Acute on chronic combined systolic (congestive) and diastolic (congestive) heart failure (HCC)  Active Problems:    Complex care coordination    OSA on CPAP    COPD (chronic obstructive pulmonary disease) (HCC)    Morbid obesity with BMI of 40.0-44.9, adult (HCC)    Essential hypertension    CAD (coronary artery disease), native coronary artery    Cellulitis of left lower extremity    ICD (implantable cardioverter-defibrillator), biventricular, in situ    Ischemic cardiomyopathy    S/P left pulmonary artery pressure sensor implant placement (CardioMEMs)     Hypogammaglobulinemia (HCC)    Chronic respiratory failure with hypercapnia (HCC)    Type 2 diabetes mellitus with hyperglycemia, without long-term current use of insulin (HCC)    Atrial fibrillation (HCC)    Chronic bronchitis (HCC)      Plan:  Today:  1. Admit to heart failure service with attending physician Dr. Vanetta Shawl.    2. Bumex 2 mg IV x 1.   3. Consult pulmonology for concern for COPD exacerbation/possible pneumonia.  Will give Solu-Medrol 125 mg IV x1.  Start vancomycin and cefepime.  Obtain sputum culture.  Continue nebulizer treatments and Aerobika flutter valve.  4. Echocardiogram ordered.  5. Chest x-ray ordered.  6. Plan for RHC in the next few days once closer to euvolemic.  We will plan to calibrate CardioMEMS PA sensor at time of the procedure.  7. ID consult  for left lower extremity cellulitis.  Continue PTA Keflex 500 mg every 6 hours.  Culture of left knee wound ordered.  Blood culture x2.  8. Abdominal ultrasound 10/5: No significant abdominal ascites noted.  9. Daily CardioMEMS readings.  PAD 15 mmHg today.  Goal 12?13 with threshold 10?15.  Concern that PAD readings may not be accurate and need device needs calibration.  10. Wound team consulted for coccyx wounds.  11. Continue PTA spironolactone  12. CRT-D, [facility], interrogation ordered.    Ongoing:  1. BMP once a day. Magnesium level daily.  Keep Potassium greater than 4.0 and Magnesium greater than 2.0.  2. 2000mg  sodium dietary restriction.  3. Fluid Restriction:1.5  4. Strict I/O. Goal output: net neg 2L/24 hour  5. Follow up appointment with a member of the HF team or PCP within 7 calendar days of discharge.    Patient examined and discussed with Dr. Vanetta Shawl    Glennis Brink, APRN-NP  Pager: [phone]  Available on Voalte       Assessment   Acute on chronic systolic and diastolic HFrEF,  EF: 30%.  ICM  Moderate MR  Last echocardiogram in March showed LVEF of 30% with moderate MR.  Major Complications or Comorbidities [facility]): acute/ acute on chronic systolic and/or diastolic heart failure  NYHA functional class IV (unable to carry on any physical activity without symptoms of HF, or symptoms of HF at rest),   ACC Stage C (structural heart disease with prior or current symptoms of HF)  Admission BNP: (!) [id number]  Date 11/24/19 0700 - 11/25/19 0659(Not Admitted) 11/25/19 0700 - 11/26/19 0659   Shift 0700-0659 24 Hour Total 0700-0659 24 Hour Total   INTAKE   P.O.   200 200   Shift Total   200 200   OUTPUT   Shift Total       NET   200 200   Weight (kg)         Goal Dry Weight: ~ 285 lbs   Admission )Weight: (!) 138 kg (304 lb 3.2 oz)   Most recent weights (inpatient):   Vitals:    11/25/19 1338   Weight: (!) 138 kg (304 lb 3.2 oz)       Diuretic Therapy   CardioMEMS PAD   Prior to admission dose  Bumex 2 mg daily    Given on admission  10/5: Bumex 2 mg IV  10/5: PAD 15   Daily Dosing         Intake/Output: N/A    GDMT PTA Name & Dose Changes   HF approved beta blockers: No (Hypotension)    ACE/ARB/ Angiotensin II Receptor Blocker Neprilysin Inhibitor: No  Hypotension    SGLT2 inhibitor  Jardiance 10 mg daily  replace with formulary Farxiga 10 mg daily   Aldosterone Antagonist:  Spironolactone 25 mg BID  continued on admission   Isordil/hydralazine: No (N/A - patient not Black/African American)    Ivabradine: No; Not treated with maximally tolerated dose beta blockers or beta blockers contraindicated    HRM Device Therapy: Yes (CRT-D)    Anticoagulation for current or history of atrial fibrillation/flutter: na      CKD, stage III  -Baseline creatinine 1.3?1.7  -Admission creatinine 1.24  -Avoid nephrotoxins  > Monitor daily    CRT-D in situ  -Placed 11/25/2015 by Dr. Betti Cruz  -Last device interrogation in office on 12/23/2018: 2 short AT events, PVC count 2.7%.  CRT pacing 95%.  > Device interrogation ordered  10/5      CAD  - LHC 06/21/18 with DES to the CTO left circumflex by Dr. Alden Server.    - Prior left cardiac catheterization 04/02/17 with DES x2 to the RCA w/ small distal?RCA perforation, trace?pericardial effusion;?known total occlusion of the CX.??Left main and LAD have 30-40%?dz.   > Continue ASA 81 mg, atorvastatin 40 mg daily.      Lower extremity cellulitis  > Continue PTA Keflex 500 mg 4 times daily  > Consult ID  > Culture left knee wound      Chronic bronchitis/COPD exacerbation/possible pneumonia  Chronic bronchiectasis  Prior COVID-19 infection with hospitalization  -He follows with Dr. Lambert Mody  -Previously on antibiotic prophylaxis with doxycycline, and multiple prednisone tapers  -COVID-19 hospitalization 10/14?12/15/2018: Treated with remdesivir, convalescent plasma, dexamethasone  -Has been wearing 4 L O2 at home for several weeks.  > 10/5: Consult pulmonology.  Sputum culture ordered.    > Solu-Medrol 125 mg x 1 IV.   > 10/5: Start vancomycin and cefepime.   > Continue PTA nebulizer and inhaler therapies    Morbid obesity  - Body mass index is 42.43 kg/m?[name]      Infrarenal abdominal aortic aneurysm,   - post endovascular repair, September of 2014?   - He follows with Dr. Chales Abrahams and was seen on 01/15/2019 with a CT of the abdomen the same day revealing aneurysmal sac size continues to decrease, now measuring 4.2 cm, previously 5 cm one year ago.    >follow-up appointment with Dr. Elizabeth Palau on 12/02/2019     Alzheimer's  -Patient's wife reports patient recently being diagnosed with Alzheimer's        Fluids, Electrolytes, Nutrition: No IVF, monitor daily, cardiac low-sodium  Prophylaxis: Lovenox  Code: Full  Disposition: Admit to heart failure service    Review of Systems:  Langley Krom. reports today increasing cough over the last several weeks with sputum production and worsening dyspnea on exertion and shortness of breath with minimal activity. His weight continues to elevate.      A comprehensive review of systems was negative except for: Constitutional: positive for fatigue  Respiratory: positive for cough, sputum, increased work of breathing, wheezing, dyspnea on exertion or chronic bronchitis  Cardiovascular: positive for dyspnea, fatigue, orthopnea, lower extremity edema  Gastrointestinal: positive for Abdominal distention  Integument/breast: positive for Weeping of left lower extremity, coccyx pressure wound    Medical History:   Diagnosis Date   ? AAA (abdominal aortic aneurysm) (HCC) 10/15/2012   ? Asthma    ? BPH with obstruction/lower urinary tract symptoms    ? Bronchiectasis (HCC)    ? CAD (coronary artery disease), native coronary artery 08/15/2012    07/11/2002- Cath @ [facility] showed Severe double vessel disease(OMB of circumflex and distal circ)  inferior-basilar dyskinesis.                 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  09/12/12   Cath    ? Cardiomyopathy (HCC) 07/19/2014   ? Cellulitis of left lower extremity 11/12/2014    10/2014: admitted with cellulitis, [place] negative for venous clot, MRI without osteomyelitis.    11/12/2014 In clinic today, still with cellulitis.  Per patient and his wife, the erythema may be extending.  Cultures from drainage in hospital with MSSA, but Blood Cx  negative.  No e/o osteomyelitis.  My concern is that this antibiotic regimen is not adequately treating his cellulitis.  We will broaden coverage to get MRSA with doxycycline.  Patient and wife given strict call/return criteria.  I will see patient in 3-4 days for re-evaluation.  No fever today.  Plan:  Stop cefpodoxime and start doxycylcine 11/17/2014 Much better, no warmth and erythema minimal.   Plan: continue doxycyline for full 10 day course.      ? Chest wall deformity 07/09/2012    Chest wall reconstruction on 07/09/12    ? CHF (congestive heart failure) (HCC)    ? 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 Chronic cough 08/29/2012   ? 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 COPD (chronic obstructive pulmonary disease) (HCC) 08/13/2012   ? Dyspnea    ? Edema    ? GERD (gastroesophageal reflux disease) 08/13/2012   ? History of CHF (congestive heart failure) 08/13/2012   ? History of MRSA infection 10/14/2012   ? 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    ? Hypertension 08/13/2012   ? 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 Morbid obesity (HCC) 08/13/2012   ? On supplemental oxygen therapy    ? OSA on CPAP 08/13/2012   ? Pneumonia 04/2012    Georgina Pillion- [facility]   ? Seasonal allergic reaction      Surgical History:   Procedure Laterality Date   ? LUNG SURGERY  2015    Bio bridge in left lung/rib cage was broken  from coughing   ? BACK SURGERY  Jan 2016   ? Right Heart Catheterization Right 11/06/2014    Performed by Cath, Physician at [facility] CATH LAB   ? CARDIAC DEFIBRILLATOR PLACEMENT  2017   ? Right Heart Catheterization With Insertion Pulmonary Artery Sensor Right 03/30/2015    Performed by Harley Alto, MD at [facility] CATH LAB   ? Insert CRT-D and Leads Right 11/25/2015    Performed by Deniece Ree, MD at [facility] EP LAB   ? Fluoroscopy N/A 11/25/2015    Performed by Deniece Ree, MD at [facility] EP LAB   ? Defibrillation Threshold Testing at ICD Implant N/A 11/25/2015    Performed by Deniece Ree, MD at [facility] EP LAB   ? VARICOSE VEIN SURGERY Left 01/05/2016    left small saphenous endovenous ablation with left leg microphlebectomy-Dr. Junita Push   ? HX MICROPHLEBECTOMY Right 07/06/2016    [place]   ? HX ENDOVENOUS ABLATION OF THE SMALL SAPHENOUS VEIN Right 07/06/2016    [place]   ? ANGIOGRAPHY CORONARY ARTERY WITH LEFT HEART CATHETERIZATION N/A 04/02/2017    Performed by Nat Math, MD at [facility] CATH LAB   ? PERCUTANEOUS CORONARY STENT PLACEMENT WITH ANGIOPLASTY N/A 04/02/2017    Performed by Nat Math, MD at [facility] CATH LAB   ? PERCUTANEOUS CORONARY STENT PLACEMENT WITH ANGIOPLASTY N/A 06/29/2017    Performed by Nat Math, MD at  [facility] CATH LAB   ? LEFT ULNAR NERVE DECOMPRESSION AT ELBOW Left 04/30/2018    Performed by Letitia Neri, MD at [facility] OR   ? BRONCHOSCOPY     ? CARDIAC CATHERIZATION  several years ago    [place]   ? DOPPLER ECHOCARDIOGRAPHY     ? HERNIA REPAIR     ? HX CHOLECYSTECTOMY     ? HX HEART CATHETERIZATION     ? HX LUMBAR LAMINECTOMY     ? HX PACEMAKER PLACEMENT       Family History   Problem Relation Age of Onset   ? Cancer Mother         liver   ? Stroke Sister    ? Cancer Father    ? Coronary Artery Disease Father      Social History     Socioeconomic History   ? Marital status: Married     Spouse name: [name]   ? Number of children: 0   ? Years of education: Not on file   ? Highest education level: Not on file   Occupational History   ? Occupation: former Therapist, music: RETIRED     Comment: lives in a 80 year old house with wife   Tobacco Use   ? Smoking status: Former Smoker     Packs/day: 2.00     Years: 40.00     Pack years: 80.00     Types: Cigarettes     Quit date: 07/09/1998     Years since quitting: 21.3   ? Smokeless tobacco: Never Used   Vaping Use   ? Vaping Use: Never used   Substance and Sexual Activity   ? Alcohol use: Not Currently   ? Drug use: Never   ? Sexual activity: Never   Other Topics Concern   ? Not on file   Social History Narrative   ? Not on file              Objective:    Allergies:   Allergies   Allergen Reactions   ? Chlorhexidine BLISTERS and EDEMA        Medications:  Scheduled Meds:Continuous Infusions:  PRN and Respiratory Meds:    Medications Prior to Admission   Medication Sig Dispense Refill Last Dose   ? acetaminophen (TYLENOL) 325 mg tablet Take two tablets by mouth every 6 hours as needed for Pain. 30 tablet 0    ? albuterol 0.083% (PROVENTIL) 2.5 mg /3 mL (0.083 %) nebulizer solution Inhale 3 mL solution by nebulizer as directed every 6 hours as needed for Wheezing or Shortness of Breath. Indications: asthma 270 mL 11    ? albuterol sulfate (PROAIR HFA) 90 mcg/actuation aerosol inhaler Inhale two puffs by mouth into the lungs every 4 hours as needed for Wheezing or Shortness of Breath. 18 g 11    ? allopurinoL (ZYLOPRIM) 300 mg tablet Take 300 mg by mouth daily. Take with food.      ? amitriptyline/gabapentin/emu oil(#) (AMITRIPTYLINE/GABAPENTIN/EMU OIL(#)) 05/24/08 % Apply  topically to affected area.      ? ascorbic acid-ascorbate sodium 500 mg chew Chew  by mouth.      ? aspirin EC 81 mg tablet Take one tablet by mouth daily. Take with food. 90 tablet 3    ? atorvastatin (LIPITOR) 40 mg tablet TAKE (1) TABLET BY MOUTH ONCE DAILY 90 tablet 1    ? benzonatate (TESSALON PERLES) 100 mg capsule Take 100  mg by mouth every 8 hours.      ? budesonide-formoterol (SYMBICORT HFA) 160-4.5 mcg/actuation inhalation Inhale two puffs by mouth into the lungs twice daily. Rinse mouth after use. 10.2 g 11    ? bumetanide (BUMEX) 2 mg tablet Take one tablet by mouth every 48 hours. Take extra dose for swelling (Patient taking differently: Take 2 mg by mouth daily. Take extra dose for swelling) 180 tablet 3    ? cephalexin (KEFLEX) 500 mg capsule Take one capsule by mouth four times daily. 56 capsule 0    ? cholecalciferol (VITAMIN D-3) 1,000 units tablet Take 1,000 Units by mouth daily.      ? duloxetine DR (CYMBALTA) 60 mg capsule Take one capsule by mouth daily. 90 capsule 3    ? empagliflozin (JARDIANCE) 10 mg tablet Take one tablet by mouth daily. 90 tablet 3    ? finasteride (PROSCAR) 5 mg tablet Take one tablet by mouth daily. 90 tablet 3    ? fish oil- omega 3-DHA/EPA 300/1,000 mg capsule Take 1 capsule by mouth daily.      ? gabapentin (NEURONTIN) 400 mg capsule Take 1 capsule by mouth three times daily as needed.      ? ipratropium (ATROVENT) 0.03 % nasal spray Apply two sprays to each nostril as directed every 12 hours. 30 mL 3    ? levoFLOXacin (LEVAQUIN) 750 mg tablet Take one tablet by mouth daily. 7 tablet 0    ? Miscellaneous Medical Supply misc Rx: Please wrap legs with ACE bandage from toes as high up to the leg as possible bilaterally  Dx: Lymphedema 2 each 0    ? montelukast (SINGULAIR) 10 mg tablet Take one tablet by mouth at bedtime daily. 90 tablet 1    ? nitroglycerin (NITROSTAT) 0.4 mg tablet Place 0.4 mg under tongue every 5 minutes as needed for Chest Pain. Max of 3 tablets, call 911.      ? pantoprazole DR (PROTONIX) 40 mg tablet TAKE (1) TABLET BY MOUTH TWO TIMES A DAY. 60 tablet 5    ? sodium chloride 3 % nebulizer solution Inhale 4 mL by mouth into the lungs twice daily. Use with [place].  Indications: asthma, chronic cough, bronchiectasis 60 each 11    ? spironolactone (ALDACTONE) 25 mg tablet Take one tablet by mouth twice daily with food. 180 tablet 3    ? tamsulosin (FLOMAX) 0.4 mg capsule Take one capsule by mouth daily. 90 capsule 3    ? traMADoL (ULTRAM) 50 mg tablet Take one tablet by mouth every 12 hours as needed for Pain. 60 tablet 0    ? zinc sulfate 220 mg (50 mg elemental zinc) capsule Take 220 mg by mouth daily.                                Vital Signs:  Last Filed                Vital Signs: 24 Hour Range   BP: 140/76 (10/05 1338)  Temp: 36.3 ?C (97.4 ?F) (10/05 1338)  Pulse: 98 (10/05 1338)  SpO2: 95 % (10/05 1338)  Height: 180.3 cm (5' 11) (10/05 1338)  BP: (135-140)/(64-76)   Temp:  [36.3 ?C (97.4 ?F)]   Pulse:  [54-98]   SpO2:  [94 %-95 %]            Wt Readings from Last 10 Encounters:   11/25/19 [name])  138 kg (304 lb 3.2 oz)   11/25/19 (!) 138 kg (304 lb 3.2 oz)   11/20/19 (!) 137 kg (302 lb)   11/06/19 133.8 kg (295 lb)   10/21/19 132 kg (291 lb)   09/12/19 133.8 kg (295 lb)   09/02/19 133.8 kg (295 lb)   05/21/19 123.8 kg (273 lb)   05/16/19 123.8 kg (273 lb)   05/16/19 123.8 kg (273 lb)       Physical Exam:    General Appearance: no distress, obese  Skin: warm and dry  Lips & Oral Mucosa: no pallor or cyanosis   Digits and Nails: normal color, smooth symmetric nails and digits  Eyes: conjunctivae and lids normal  Neck Veins: JVP 12cm, positive HJR?difficult to assess given body habitus  Chest Inspection: left chest incision well healed  Auscultation/Percussion: breathing comfortably, lung sounds diminished and coarse to auscultation, no rales or rhonchi, Wheezing throughout, O2 per NC 4 L  Cardiac Auscultation: Regular rhythm, S1, S2, no S3 or S4, no murmur  Radial Arteries: normal symmetric radial pulses  Pedal Pulses: pulses 2+, symmetric  Lower Extremity Edema: 2?3+ left, 1-2+ right lower extremity edema  Abdominal Exam: soft, non-tender, bowel sounds normal, no hepatomegaly  Gait & Station: Seated in chair  Muscle Strength: normal strength and tone  Orientation: clear historian, good insight    Laboratory Review:   CBC w diff    Lab Results   Component Value Date/Time    WBC 6.0 11/25/2019 02:45 PM    RBC 3.24 (L) 11/25/2019 02:45 PM    HGB 11.2 (L) 11/25/2019 02:45 PM    HCT 32.9 (L) 11/25/2019 02:45 PM    MCV 101.6 (H) 11/25/2019 02:45 PM    MCH 34.6 (H) 11/25/2019 02:45 PM    MCHC 34.1 11/25/2019 02:45 PM    RDW 14.5 11/25/2019 02:45 PM    PLTCT 225 11/25/2019 02:45 PM    MPV 7.7 11/25/2019 02:45 PM    Lab Results   Component Value Date/Time    NEUT 70 11/25/2019 02:45 PM    ANC 4.23 11/25/2019 02:45 PM    LYMA 12 (L) 11/25/2019 02:45 PM    ALC 0.70 (L) 11/25/2019 02:45 PM    MONA 12 11/25/2019 02:45 PM    AMC 0.73 11/25/2019 02:45 PM    EOSA 5 11/25/2019 02:45 PM    AEC 0.29 11/25/2019 02:45 PM    BASA 1 11/25/2019 02:45 PM    ABC 0.04 11/25/2019 02:45 PM         Chemistry    Lab Results   Component Value Date/Time    NA 140 11/25/2019 02:45 PM    K 4.0 11/25/2019 02:45 PM    CL 101 11/25/2019 02:45 PM    CO2 27 11/25/2019 02:45 PM    GAP 12 11/25/2019 02:45 PM    BUN 24 11/25/2019 02:45 PM    CR 1.24 11/25/2019 02:45 PM    GLU 97 11/25/2019 02:45 PM    Lab Results   Component Value Date/Time    CA 9.3 11/25/2019 02:45 PM    PO4 3.2 12/21/2018 10:34 PM    ALBUMIN 3.7 11/25/2019 02:45 PM    TOTPROT 6.6 11/25/2019 02:45 PM    ALKPHOS 67 11/25/2019 02:45 PM    AST 19 11/25/2019 02:45 PM    ALT 16 11/25/2019 02:45 PM    TOTBILI 0.9 11/25/2019 02:45 PM    GFR 56 (L) 11/25/2019 02:45 PM    GFRAA >60 11/25/2019 02:45  PM            Renal Function    Lab Results   Component Value Date/Time    NA 140 11/25/2019 02:45 PM    K 4.0 11/25/2019 02:45 PM    CL 101 11/25/2019 02:45 PM    CO2 27 11/25/2019 02:45 PM    GAP 12 11/25/2019 02:45 PM    BUN 24 11/25/2019 02:45 PM    BUN 28 (H) 10/21/2019 03:15 PM    BUN 34 (H) 09/02/2019 09:50 AM    Lab Results   Component Value Date/Time    CR 1.24 11/25/2019 02:45 PM    CR 1.68 (H) 10/21/2019 03:15 PM    CR 1.36 (H) 09/02/2019 09:50 AM    GLU 97 11/25/2019 02:45 PM    CA 9.3 11/25/2019 02:45 PM    PO4 3.2 12/21/2018 10:34 PM    ALBUMIN 3.7 11/25/2019 02:45 PM        Lipid Profile INR   Lab Results   Component Value Date    CHOL 106 12/05/2018    TRIG 108 12/05/2018    HDL 31 (L) 12/05/2018    LDL 60 12/05/2018    VLDL 22 12/05/2018    NONHDLCHOL 75 12/05/2018    CHOLHDLC 2.4 08/12/2018         Lab Results   Component Value Date    INR 1.0 12/15/2018          Chest X-Ray: 10/5: In process     Tele/ECG: V paced, 80s?90s    Echocardiogram Details:   Echo Results  (Last 3 results in the past 3 years)    Echo EF LVIDD LA Size IVS LVPW Rest PAP    (05/16/19)   30    (05/16/19)   5.87    (05/16/19)   4.44    (05/16/19)   1.33    (05/16/19)   1.00    (12/21/18)   37       (12/28/17)   45    (12/21/18)   6.84    (12/21/18)   4.30    (12/21/18)   0.86    (12/21/18)   0.88    (12/28/17)   40       (05/03/17)   45    (12/28/17)   5.83    (05/03/17)   3.36    (12/28/17)   1.42    (12/28/17)   1.26    (05/03/17)   37            ABD [place] 10/5:  1. No significant abdominopelvic ascites.   2. Diffuse hepatic steatosis.   3. Right renal cortical thinning.

## 2019-11-25 NOTE — Progress Notes
Daily CardioMEMS Readings    Goal PA Diastolic: 12-13 mmHg   PA Diastolic Thresholds: 10-15 mmHg

## 2019-11-25 NOTE — Consults
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 ID was consulted for assistance in management of his cellulitis. Patient was started on cephalexin last week after seen in HF clinic 9/30 and has had improvement in his erythema and pain since then. Given this, will transition the vancomycin/cefepime initially ordered on admission to cefazolin monotherapy to help minimize potential antimicrobial adverse effects and nephrotoxicity with anticipated diuresis.    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    Thank you for the interesting consultation and involving [place] in this patient's care. ID will continue to follow.    Youlanda Mighty, MD  Please use Voalte to contact ID.    The complexity of the medical decision making in this case is high due to the multisystem nature of the infectious disease process, and concerns regarding the complexity of the patient's illness.  This includes the susceptibility of the organisms being treated and potential for resistances, the potential for drug toxicity and interactions, the concerns about immunologic function, and interplay of other issues.    History of Present Illness    Ruben Grahan. is a 79 y.o. 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 ID was consulted for assistance in management of his cellulitis.    Patient was recently seen in HF clinic 9/30 where he was noted to be ~30 lbs up from his previous appointment with them, with increased SOB, dyspnea on exertion and BLE leg swelling. He typically has worse swelling on the L leg and this was the case then. He also had developed significant pain in both legs. He was prescribed cephalexin at that visit and returned for follow-up today in HF clinic. There he had gained 2 lbs with continued SOB, increased cough, abdominal bloating and concerns for increasing LE edema.     On admission he was afebrile and hemodynamically stable on his home 4L O2 with admission labs notable for a WBC of 6.0k, creatinine 1.24 and BNP 263. He does report feeling cold and hot at times over the past  few weeks but not persistently and no known fevers. He has chronic lightheadedness with standing. He has a chronic cough that had been worse in the past couple months but improved with antibiotics prescribed by Dr. Crecencio Mc and Dr. [name]. His sputum at that time was yellow-green but currently is white-clear, although he does still cough extremely frequently. He has noticed increased SOB, dyspnea on exertion and abdominal fullness but has remained on his home 4L O2. His leg swelling fluctuates widely, even throughout the day. His wife thinks the swelling this afternoon is improved compared to this morning and especially compared to a week or two ago. His pain in his legs is also significantly improved compared to last week, now just a 2/10 with occasional twinges of sharp pain. His pain is mainly located in feet and lower calves/shins now. He has a couple open wounds on his LLE that are healing but denies having any drainage from them, no purulence. The erythema in his legs has also significantly improved in the past week and also seems to vary throughout the day, seemingly improved compared to this AM. He has had some vomiting with coughing episodes but denies nausea, vomiting, dysuria, arthralgias or myalgias.    Past Medical History     Medical History:   Diagnosis Date   ? AAA (abdominal aortic aneurysm) (HCC) 10/15/2012   ? Asthma    ? BPH with obstruction/lower urinary tract symptoms    ? Bronchiectasis (HCC)    ? CAD (coronary artery disease), native coronary artery 08/15/2012    07/11/2002- Cath @ [facility] showed Severe double vessel disease(OMB of circumflex and distal circ)  inferior-basilar dyskinesis.                 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  09/12/12   Cath    ? Cardiomyopathy (HCC) 07/19/2014   ? Cellulitis of left lower extremity 11/12/2014    10/2014: admitted with cellulitis, [place] negative for venous clot, MRI without osteomyelitis.    11/12/2014 In clinic today, still with cellulitis.  Per patient and his wife, the erythema may be extending.  Cultures from drainage in hospital with MSSA, but Blood Cx negative.  No e/o osteomyelitis.  My concern is that this antibiotic regimen is not adequately treating his cellulitis.  We will broaden coverage to get MRSA with doxycycline.  Patient and wife given strict call/return criteria.  I will see patient in 3-4 days for re-evaluation.  No fever today.  Plan:  Stop cefpodoxime and start doxycylcine  11/17/2014 Much better, no warmth and erythema minimal.   Plan: continue doxycyline for full 10 day course.      ? Chest wall deformity 07/09/2012    Chest wall reconstruction on 07/09/12    ? CHF (congestive heart failure) (HCC)    ? 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 Chronic cough 08/29/2012   ? 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 COPD (chronic obstructive pulmonary disease) (HCC) 08/13/2012   ? Dyspnea    ? Edema    ? GERD (gastroesophageal reflux disease) 08/13/2012   ? History of CHF (congestive heart failure) 08/13/2012   ? History of MRSA infection 10/14/2012   ?  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    ? Hypertension 08/13/2012   ? 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 Morbid obesity (HCC) 08/13/2012   ? On supplemental oxygen therapy    ? OSA on CPAP 08/13/2012   ? Pneumonia 04/2012    [facility]   ? Seasonal allergic reaction        Past Surgical History     Surgical History:   Procedure Laterality Date   ? LUNG SURGERY  2015    Bio bridge in left lung/rib cage was broken  from coughing   ? BACK SURGERY  Jan 2016   ? Right Heart Catheterization Right 11/06/2014    Performed by Cath, Physician at [facility] CATH LAB   ? CARDIAC DEFIBRILLATOR PLACEMENT  2017   ? Right Heart Catheterization With Insertion Pulmonary Artery Sensor Right 03/30/2015    Performed by Harley Alto, MD at [facility] CATH LAB   ? Insert CRT-D and Leads Right 11/25/2015    Performed by Deniece Ree, MD at [facility] EP LAB   ? Fluoroscopy N/A 11/25/2015    Performed by Deniece Ree, MD at [facility] EP LAB   ? Defibrillation Threshold Testing at ICD Implant N/A 11/25/2015    Performed by Deniece Ree, MD at [facility] EP LAB   ? VARICOSE VEIN SURGERY Left 01/05/2016    left small saphenous endovenous ablation with left leg microphlebectomy-Dr. Junita Push   ? HX MICROPHLEBECTOMY Right 07/06/2016    [facility]   ? HX ENDOVENOUS ABLATION OF THE SMALL SAPHENOUS VEIN Right 07/06/2016    [facility]   ? ANGIOGRAPHY CORONARY ARTERY WITH LEFT HEART CATHETERIZATION N/A 04/02/2017    Performed by Nat Math, MD at [facility] CATH LAB   ? PERCUTANEOUS CORONARY STENT PLACEMENT WITH ANGIOPLASTY N/A 04/02/2017    Performed by Nat Math, MD at [facility] CATH LAB   ? PERCUTANEOUS CORONARY STENT PLACEMENT WITH ANGIOPLASTY N/A 06/29/2017    Performed by Nat Math, MD at [facility] CATH LAB   ? LEFT ULNAR NERVE DECOMPRESSION AT ELBOW Left 04/30/2018    Performed by Letitia Neri, MD at [facility] OR   ? BRONCHOSCOPY     ? CARDIAC CATHERIZATION  several years ago    [place]   ? DOPPLER ECHOCARDIOGRAPHY     ? HERNIA REPAIR     ? HX CHOLECYSTECTOMY     ? HX HEART CATHETERIZATION     ? HX LUMBAR LAMINECTOMY     ? HX PACEMAKER PLACEMENT         Family History     Family History   Problem Relation Age of Onset   ? Cancer Mother         liver   ? Stroke Sister    ? Cancer Father    ? Coronary Artery Disease Father        Social History     Social History     Socioeconomic History   ? Marital status: Married     Spouse name: [name]   ? Number of children: 0   ? Years of education: Not  on file   ? Highest education level: Not on file   Occupational History   ? Occupation: former Therapist, music: RETIRED     Comment: lives in a 80 year old house with wife   Tobacco Use   ? Smoking status: Former Smoker Packs/day: 2.00     Years: 40.00     Pack years: 80.00     Types: Cigarettes     Quit date: 07/09/1998     Years since quitting: 21.3   ? Smokeless tobacco: Never Used   Vaping Use   ? Vaping Use: Never used   Substance and Sexual Activity   ? Alcohol use: Not Currently   ? Drug use: Never   ? Sexual activity: Never   Other Topics Concern   ? Not on file   Social History Narrative   ? Not on file       Medications     Prior to Admission Medications   Prescriptions Last Dose Informant Patient Reported? Taking?   Miscellaneous Medical Supply misc   No No   Sig: Rx: Please wrap legs with ACE bandage from toes as high up to the leg as possible bilaterally  Dx: Lymphedema   acetaminophen (TYLENOL) 325 mg tablet  Self No No   Sig: Take two tablets by mouth every 6 hours as needed for Pain.   albuterol 0.083% (PROVENTIL) 2.5 mg /3 mL (0.083 %) nebulizer solution   No No   Sig: Inhale 3 mL solution by nebulizer as directed every 6 hours as needed for Wheezing or Shortness of Breath. Indications: asthma   albuterol sulfate (PROAIR HFA) 90 mcg/actuation aerosol inhaler   No No   Sig: Inhale two puffs by mouth into the lungs every 4 hours as needed for Wheezing or Shortness of Breath.   allopurinoL (ZYLOPRIM) 300 mg tablet   Yes No   Sig: Take 300 mg by mouth daily. Take with food.   amitriptyline/gabapentin/emu oil(#) (AMITRIPTYLINE/GABAPENTIN/EMU OIL(#)) 05/24/08 %   Yes No   Sig: Apply  topically to affected area.   ascorbic acid-ascorbate sodium 500 mg chew   Yes No   Sig: Chew  by mouth.   aspirin EC 81 mg tablet  Self No No   Sig: Take one tablet by mouth daily. Take with food.   atorvastatin (LIPITOR) 40 mg tablet   No No   Sig: TAKE (1) TABLET BY MOUTH ONCE DAILY   benzonatate (TESSALON PERLES) 100 mg capsule   Yes No   Sig: Take 100 mg by mouth every 8 hours.   budesonide-formoterol (SYMBICORT HFA) 160-4.5 mcg/actuation inhalation   No No   Sig: Inhale two puffs by mouth into the lungs twice daily. Rinse mouth after use.   bumetanide (BUMEX) 2 mg tablet   No No   Sig: Take one tablet by mouth every 48 hours. Take extra dose for swelling   Patient taking differently: Take 2 mg by mouth daily. Take extra dose for swelling   cephalexin (KEFLEX) 500 mg capsule   No No   Sig: Take one capsule by mouth four times daily.   cholecalciferol (VITAMIN D-3) 1,000 units tablet  Self Yes No   Sig: Take 1,000 Units by mouth daily.   duloxetine DR (CYMBALTA) 60 mg capsule   No No   Sig: Take one capsule by mouth daily.   empagliflozin (JARDIANCE) 10 mg tablet   No No   Sig: Take one tablet by mouth  daily.   finasteride (PROSCAR) 5 mg tablet   No No   Sig: Take one tablet by mouth daily.   fish oil- omega 3-DHA/EPA 300/1,000 mg capsule  Self Yes No   Sig: Take 1 capsule by mouth daily.   gabapentin (NEURONTIN) 400 mg capsule   Yes No   Sig: Take 1 capsule by mouth three times daily as needed.   ipratropium (ATROVENT) 0.03 % nasal spray   No No   Sig: Apply two sprays to each nostril as directed every 12 hours.   levoFLOXacin (LEVAQUIN) 750 mg tablet   No No   Sig: Take one tablet by mouth daily.   montelukast (SINGULAIR) 10 mg tablet   No No   Sig: Take one tablet by mouth at bedtime daily.   nitroglycerin (NITROSTAT) 0.4 mg tablet  Self Yes No   Sig: Place 0.4 mg under tongue every 5 minutes as needed for Chest Pain. Max of 3 tablets, call 911.   pantoprazole DR (PROTONIX) 40 mg tablet   No No   Sig: TAKE (1) TABLET BY MOUTH TWO TIMES A DAY.   sodium chloride 3 % nebulizer solution   No No   Sig: Inhale 4 mL by mouth into the lungs twice daily. Use with [place].  Indications: asthma, chronic cough, bronchiectasis   spironolactone (ALDACTONE) 25 mg tablet   No No   Sig: Take one tablet by mouth twice daily with food.   tamsulosin (FLOMAX) 0.4 mg capsule   No No   Sig: Take one capsule by mouth daily.   traMADoL (ULTRAM) 50 mg tablet   No No   Sig: Take one tablet by mouth every 12 hours as needed for Pain.   zinc sulfate 220 mg (50 mg elemental zinc) capsule   Yes No   Sig: Take 220 mg by mouth daily.      Facility-Administered Medications: None       Scheduled Meds:[START ON 11/26/2019] aspirin EC tablet 81 mg, 81 mg, Oral, QDAY  atorvastatin (LIPITOR) tablet 40 mg, 40 mg, Oral, QDAY  budesonide-formoterol HFA (SYMBICORT) 160-4.5 mcg/actuation inhalation 2 puff, 2 puff, Inhalation, BID  ceFAZolin (ANCEF) IVP 2 g, 2 g, Intravenous, Q8H*  dapagliflozin (FARXIGA) tablet 10 mg, 10 mg, Oral, QDAY  [START ON 11/26/2019] duloxetine DR (CYMBALTA) capsule 60 mg, 60 mg, Oral, QDAY  enoxaparin (LOVENOX) syringe 40 mg, 40 mg, Subcutaneous, BID  [START ON 11/26/2019] finasteride (PROSCAR) tablet 5 mg, 5 mg, Oral, QDAY  ipratropium bromide (ATROVENT) nasal spray 2 spray, 2 spray, Each Nostril, Q12H*  montelukast (SINGULAIR) tablet 10 mg, 10 mg, Oral, QHS  pantoprazole DR (PROTONIX) tablet 40 mg, 40 mg, Oral, GNF(62-13)  spironolactone (ALDACTONE) tablet 25 mg, 25 mg, Oral, QDAY  [START ON 11/26/2019] tamsulosin (FLOMAX) capsule 0.4 mg, 0.4 mg, Oral, QDAY    Continuous Infusions:  PRN and Respiratory Meds:acetaminophen Q6H PRN, albuterol 0.083% Q6H PRN, albuterol sulfate Q4H PRN, emu PRN, nitroglycerin Q5 MIN PRN, perflutren lipid microspheres Once PRN, polyethylene glycol 3350 BID PRN, traMADoL Q12H PRN      Allergies     Allergies   Allergen Reactions   ? Chlorhexidine BLISTERS and EDEMA       Review of Systems   A comprehensive 14-point review of systems was negative except for those listed above.    Physical Examination                          Vital Signs:  Last Filed                 Vital Signs: 24 Hour Range   BP: 140/76 (10/05 1338)  Temp: 36.3 ?C (97.4 ?F) (10/05 1338)  Pulse: 98 (10/05 1338)  SpO2: 95 % (10/05 1338)  Height: 180.3 cm (71) (10/05 1338) BP: (135-140)/(64-76)   Temp:  [36.3 ?C (97.4 ?F)]   Pulse:  [54-98]   SpO2:  [94 %-95 %]      Vitals:    11/25/19 1338   Weight: (!) 138 kg (304 lb 3.2 oz)       Intake/Output Summary:  (Last 24 hours)    Intake/Output Summary (Last 24 hours) at 11/25/2019 1734  Last data filed at 11/25/2019 1400  Gross per 24 hour   Intake 200 ml   Output ?   Net 200 ml           General appearance: alert, oriented, NAD, elderly Caucasian male, pleasant, chronically ill appearing  HENT: Normocephalic, atraumatic, patent nares, moist mucous membranes, no oropharyngeal lesions or thrush  Eyes: Conjunctiva normal, sclera nonicteric, PERRL, EOM grossly intact  Neck: supple, trachea midline, no lymphadenopathy  Lungs: clear to auscultation bilaterally, no wheezing, rhonchi, rales appreciated  Heart: Regular rhythm, regular rate, no murmur   Abdomen: soft, obese, non-tender, nondistended, normal bowel sounds,   no hepatosplenomegaly, no masses   Ext:  No clubbing, cyanosis. BLE edema L>R w/ 2+ pitting edema, slightly TTP on LLE  Skin: Two small healing wounds on LLE w/o purulent drainage. Hyperpigmented changes to BLE noted consistent w/ chronic venous stasis changes. Erythema only present over bilateral feet  Lymph: no cervical, axillary or inguinal adenopathy  Neuro: alert, oriented, able to move all extremities  Psych: mood and affect appropriate    Lines: LUE peripheral IV    Labs   Hematology:  Recent Labs     11/25/19  1445   WBC 6.0   HGB 11.2*   HCT 32.9*   PLTCT 225     Chemistry:  Recent Labs     11/25/19  1445   NA 140   K 4.0   CL 101   CO2 27   BUN 24   CR 1.24   GFR 56*   GLU 97   CA 9.3   ALBUMIN 3.7   ALKPHOS 67   AST 19   ALT 16   TOTBILI 0.9         Microbiology, Radiology and other Diagnostics Review   Micro:  10/5 BCx pending    Microbiology data was personally reviewed by myself.      Radiology:  10/5 CXR  Stable chest radiograph demonstrating mild cardiomegaly and bibasilar   pulmonary opacities, which may reflect areas of fibrosis/scarring.     Pertinent radiology images were personally reviewed by myself and I agree with the above impressions.      Youlanda Mighty, MD   Division of Infectious Diseases 11/25/2019  Please use Voalte to contact ID.

## 2019-11-25 NOTE — Progress Notes
BRIEF PULMONARY NOTE:  Patient unavailable for consult evaluation this afternoon, so Dr Rondel Oh will see him first thing tomorrow morning.

## 2019-11-25 NOTE — Consults
Wound Ostomy Note    NAME:Ruben Wood.                                                                   MRN: [id number]                 DOB:02/19/1941          AGE: 79 y.o.  ADMISSION DATE: 11/25/2019             DAYS ADMITTED: LOS: 0 days      Reason for Consult/Visit: pressure injury Stage II or greater and wound not pressure    Assessment/Plan:    Principal Problem:    Acute on chronic combined systolic (congestive) and diastolic (congestive) heart failure (HCC)  Active Problems:    Complex care coordination    OSA on CPAP    COPD (chronic obstructive pulmonary disease) (HCC)    Morbid obesity with BMI of 40.0-44.9, adult (HCC)    Essential hypertension    CAD (coronary artery disease), native coronary artery    Cellulitis of left lower extremity    ICD (implantable cardioverter-defibrillator), biventricular, in situ    Ischemic cardiomyopathy    S/P left pulmonary artery pressure sensor implant placement ([employer])     Hypogammaglobulinemia (HCC)    Chronic respiratory failure with hypercapnia (HCC)    Type 2 diabetes mellitus with hyperglycemia, without long-term current use of insulin (HCC)    Atrial fibrillation (HCC)    Chronic bronchitis (HCC)    Consult: coccyx + LLE    Pt presents with red, dry flaky skin on his buttocks.  No pressure injury identified.  Pt's wife states that he had an adhesive dressing on his buttocks that he may have had a reaction to. Discussed trying A&D ointment on his skin.    Pt also has a non-healing wound on his left knee and shin.  Yellow slough noted on the left knee.  We discussed using A&D ointment to help clear away the slough.  Pt c/o pain in both legs and is interested in trying emu oil.  Pt's RN at bedside during my assessment.    Recommend: Consider emu oil for his lower legs.    Plan:     Buttocks: Apply vitamin A&D ointment to area TID and PRN soiling per floor RN.  Continue q2hr turning schedule using foam wedge for support.  Place an egg crate cushion in his chair.    Left knee/shin: Clean BLE with soap and water. Apply A&D ointment to wound base, cover with a primapore or cover with gauze and wrap with kerlix or cotton kling wrap.  Dressing changes daily per floor RN.      buttocks        Wounds 11/25/19 1341 Ulcer (Not for pressure) Left Leg (Active)   11/25/19 1341 Leg   Wound Type: Ulcer (Not for pressure)   Pressure Injury Stages:    Pressure Injury Present On Inpatient Admission:    Wound/Pressure Injury Orientation: Left   Wound Location Comments:    Wound Description (Comments):    Wound Type::    Wound Image    11/25/19 1400   Wound Dressing Status Dressing reinforced 11/25/19 1400   Wound Drainage Description Serous 11/25/19 1400  Wound Drainage Amount Scant 11/25/19 1400   Wound Base Assessment Yellow;Slough;Pink;Moist 11/25/19 1400   Surrounding Skin Assessment Intact 11/25/19 1400   Wound Site Closure Wound Adhesive Bandage 11/25/19 1400   Wound Status (Wound Team Only) Being Treated 11/25/19 1400   Wound Length (cm) 1.5 cm 11/25/19 1400   Wound Width (cm) 1.5 cm 11/25/19 1400   Wound Depth (cm) 0.3 cm 11/25/19 1400   Wound Surface Area (cm^2) 2.25 cm^2 11/25/19 1400   Wound Volume (cm^3) 0.675 cm^3 11/25/19 1400   Number of days: 0     --Wound care and maintenance orders placed per skin integrity protocol.     Lora Havens, RN, BSN, CMSRN, [employer]  Wound Ostomy Nursing Consult Service  Available via [employer] or [employer] M-F [phone]  Office number [phone]  Contact Team On Call after 4:00pm M-F/weekends/holidays

## 2019-11-25 NOTE — Progress Notes
RT Adult Assessment Note    NAME:Ruben Wood.             MRN: [id number]             DOB:02/06/1941          AGE: 79 y.o.  ADMISSION DATE: 11/25/2019             DAYS ADMITTED: LOS: 0 days    RT Treatment Plan:  Protocol Plan: Medications  Albuterol: MDI PRN;Nebulizer BID & PRN  Symbicort (Home regimen only): BID    Protocol Plan: Procedures  PAP: Place a nursing order for IS Q1h While Awake for any of Lung Expansion indicators  Oxygen/Humidity: O2 to keep SpO2 > 92%, if not on any RT modality, D/C protocol if greater than 24 hours on room air  SpO2: Continuous (Document SpO2 result Qshift);BID & PRN    Additional Comments:  Impressions of the patient: A/O, No distress currently noted  Intervention(s)/outcome(s): home regimen ordered, LE/AM/O2 OSA  Patient education that was completed:   Recommendations to the care team:     Vital Signs:  Pulse: 86  RR: 18 PER MINUTE  SpO2: 95 %  O2 Device: Cannula  Liter Flow: 4 Lpm  O2%:    Breath Sounds: Coarse crackles  Respiratory Effort: Non-Labored

## 2019-11-26 ENCOUNTER — Encounter: Admit: 2019-11-26 | Discharge: 2019-11-26 | Payer: MEDICARE

## 2019-11-26 ENCOUNTER — Inpatient Hospital Stay: Admit: 2019-11-26 | Discharge: 2019-11-26 | Payer: MEDICARE

## 2019-11-26 LAB — COVID-19 (SARS-COV-2) PCR

## 2019-11-26 MED ADMIN — IPRATROPIUM-ALBUTEROL 0.5 MG-3 MG(2.5 MG BASE)/3 ML IN NEBU [77459]: 3 mL | RESPIRATORY_TRACT | @ 18:00:00 | NDC 69097084034

## 2019-11-26 MED ADMIN — CEFTRIAXONE INJ 2GM IVP [210254]: 2 g | INTRAVENOUS | @ 15:00:00 | NDC 00781320990

## 2019-11-26 MED ADMIN — BUMETANIDE 0.25 MG/ML IJ SOLN [9308]: 2 mg | INTRAVENOUS | @ 14:00:00 | Stop: 2019-11-26 | NDC 00409141234

## 2019-11-26 MED ADMIN — PERFLUTREN LIPID MICROSPHERES 1.1 MG/ML IV SUSP [79178]: 2 mL | INTRAVENOUS | @ 16:00:00 | Stop: 2019-11-26 | NDC 11994001116

## 2019-11-26 MED ADMIN — VANCOMYCIN 5 GRAM IV SOLR [8444]: 2000 mg | INTRAVENOUS | @ 17:00:00 | Stop: 2019-11-26 | NDC 00409650901

## 2019-11-26 MED ADMIN — IPRATROPIUM-ALBUTEROL 0.5 MG-3 MG(2.5 MG BASE)/3 ML IN NEBU [77459]: 3 mL | RESPIRATORY_TRACT | @ 22:00:00 | NDC 69097084034

## 2019-11-26 MED ADMIN — WATER FOR INJECTION, STERILE IJ SOLN [79513]: 20 mL | INTRAVENOUS | @ 15:00:00 | Stop: 2019-11-26 | NDC 00409488723

## 2019-11-26 MED ADMIN — METHYLPREDNISOLONE SOD SUC(PF) 125 MG/2 ML IJ SOLR [301233]: 62.5 mg | INTRAVENOUS | @ 20:00:00 | Stop: 2019-11-27 | NDC 00009004725

## 2019-11-26 MED ADMIN — WATER FOR INJECTION, STERILE IJ SOLN [79513]: 20 mL | INTRAVENOUS | @ 06:00:00 | Stop: 2019-11-26 | NDC 00409488723

## 2019-11-26 NOTE — Telephone Encounter
Patients wife called to apologize for not cancelling patients appointment as he is in the hospital.    I returned call advising no apology needed and the we are thinking of both of them.  I let her know Dr. Crecencio Mc was already aware and appointment had been cancelled.    Etta Grandchild, RN

## 2019-11-27 ENCOUNTER — Inpatient Hospital Stay: Admit: 2019-11-27 | Discharge: 2019-11-27 | Payer: MEDICARE

## 2019-11-27 MED ADMIN — CEFTRIAXONE INJ 2GM IVP [210254]: 2 g | INTRAVENOUS | @ 14:00:00 | NDC 00781320990

## 2019-11-27 MED ADMIN — BUDESONIDE 0.5 MG/2 ML IN NBSP [81378]: 0.5 mg | RESPIRATORY_TRACT | @ 03:00:00 | NDC 00487970101

## 2019-11-27 MED ADMIN — IPRATROPIUM-ALBUTEROL 0.5 MG-3 MG(2.5 MG BASE)/3 ML IN NEBU [77459]: 3 mL | RESPIRATORY_TRACT | @ 21:00:00 | NDC 69097084034

## 2019-11-27 MED ADMIN — IPRATROPIUM-ALBUTEROL 0.5 MG-3 MG(2.5 MG BASE)/3 ML IN NEBU [77459]: 3 mL | RESPIRATORY_TRACT | @ 13:00:00 | NDC 69097084034

## 2019-11-27 MED ADMIN — BUMETANIDE 0.25 MG/ML IJ SOLN [9308]: 2 mg | INTRAVENOUS | @ 19:00:00 | Stop: 2019-11-27 | NDC 00409141234

## 2019-11-27 MED ADMIN — METHYLPREDNISOLONE SOD SUC(PF) 125 MG/2 ML IJ SOLR [301233]: 62.5 mg | INTRAVENOUS | @ 01:00:00 | Stop: 2019-11-27 | NDC 00009004725

## 2019-11-27 MED ADMIN — SODIUM CHLORIDE 0.9 % IV SOLP [27838]: 1250 mg | INTRAVENOUS | @ 15:00:00 | NDC 00338004902

## 2019-11-27 MED ADMIN — IPRATROPIUM-ALBUTEROL 0.5 MG-3 MG(2.5 MG BASE)/3 ML IN NEBU [77459]: 3 mL | RESPIRATORY_TRACT | @ 17:00:00 | NDC 69097084034

## 2019-11-27 MED ADMIN — BUDESONIDE 0.5 MG/2 ML IN NBSP [81378]: 1 mg | RESPIRATORY_TRACT | @ 13:00:00 | NDC 00487970101

## 2019-11-27 MED ADMIN — SODIUM CHLORIDE 0.9 % IV SOLP [27838]: 1250 mg | INTRAVENOUS | @ 03:00:00 | NDC 00338004902

## 2019-11-27 MED ADMIN — VANCOMYCIN 5 GRAM IV SOLR [8444]: 1250 mg | INTRAVENOUS | @ 15:00:00 | NDC 00409650901

## 2019-11-27 MED ADMIN — GABAPENTIN 400 MG PO CAP [18307]: 400 mg | ORAL | @ 01:00:00 | NDC 00904666761

## 2019-11-27 MED ADMIN — PREDNISONE 20 MG PO TAB [6496]: 40 mg | ORAL | @ 14:00:00 | NDC 00054001820

## 2019-11-27 MED ADMIN — VANCOMYCIN 5 GRAM IV SOLR [8444]: 1250 mg | INTRAVENOUS | @ 03:00:00 | NDC 00409650901

## 2019-11-27 MED ADMIN — GABAPENTIN 400 MG PO CAP [18307]: 400 mg | ORAL | @ 13:00:00 | NDC 00904666761

## 2019-11-27 MED ADMIN — IPRATROPIUM-ALBUTEROL 0.5 MG-3 MG(2.5 MG BASE)/3 ML IN NEBU [77459]: 3 mL | RESPIRATORY_TRACT | @ 03:00:00 | NDC 69097084034

## 2019-11-27 MED ADMIN — VITS A AND D-WHITE PET-LANOLIN TP OINT [171773]: TOPICAL | @ 19:00:00 | NDC 41100081122

## 2019-11-27 MED ADMIN — TRAZODONE 50 MG PO TAB [8085]: 25 mg | ORAL | @ 03:00:00 | Stop: 2019-11-27 | NDC 00904686861

## 2019-11-28 ENCOUNTER — Encounter: Admit: 2019-11-28 | Discharge: 2019-11-28 | Payer: MEDICARE

## 2019-11-28 ENCOUNTER — Inpatient Hospital Stay: Admit: 2019-11-28 | Discharge: 2019-11-28 | Payer: MEDICARE

## 2019-11-28 MED ADMIN — BUDESONIDE 0.5 MG/2 ML IN NBSP [81378]: 1 mg | RESPIRATORY_TRACT | @ 14:00:00 | NDC 00487970101

## 2019-11-28 MED ADMIN — VANCOMYCIN 5 GRAM IV SOLR [8444]: 1250 mg | INTRAVENOUS | @ 15:00:00 | Stop: 2019-11-28 | NDC 00409650901

## 2019-11-28 MED ADMIN — CEFTRIAXONE INJ 2GM IVP [210254]: 2 g | INTRAVENOUS | @ 18:00:00 | NDC 00781320990

## 2019-11-28 MED ADMIN — IPRATROPIUM-ALBUTEROL 0.5 MG-3 MG(2.5 MG BASE)/3 ML IN NEBU [77459]: 3 mL | RESPIRATORY_TRACT | @ 14:00:00 | NDC 69097084034

## 2019-11-28 MED ADMIN — IPRATROPIUM-ALBUTEROL 0.5 MG-3 MG(2.5 MG BASE)/3 ML IN NEBU [77459]: 3 mL | RESPIRATORY_TRACT | @ 21:00:00 | NDC 69097084034

## 2019-11-28 MED ADMIN — SODIUM CHLORIDE 0.9 % IV SOLP [27838]: 1250 mg | INTRAVENOUS | @ 15:00:00 | Stop: 2019-11-28 | NDC 00338004902

## 2019-11-28 MED ADMIN — BUDESONIDE 0.5 MG/2 ML IN NBSP [81378]: 1 mg | RESPIRATORY_TRACT | @ 02:00:00 | NDC 00487970101

## 2019-11-28 MED ADMIN — IPRATROPIUM-ALBUTEROL 0.5 MG-3 MG(2.5 MG BASE)/3 ML IN NEBU [77459]: 3 mL | RESPIRATORY_TRACT | @ 02:00:00 | NDC 69097084034

## 2019-11-28 MED ADMIN — IPRATROPIUM-ALBUTEROL 0.5 MG-3 MG(2.5 MG BASE)/3 ML IN NEBU [77459]: 3 mL | RESPIRATORY_TRACT | @ 18:00:00 | NDC 69097084034

## 2019-11-28 MED ADMIN — GABAPENTIN 400 MG PO CAP [18307]: 400 mg | ORAL | @ 15:00:00 | NDC 00904666761

## 2019-11-28 MED ADMIN — VANCOMYCIN 5 GRAM IV SOLR [8444]: 1250 mg | INTRAVENOUS | @ 03:00:00 | NDC 00409650901

## 2019-11-28 MED ADMIN — TRAZODONE 50 MG PO TAB [8085]: 25 mg | ORAL | @ 03:00:00 | NDC 00904686861

## 2019-11-28 MED ADMIN — PREDNISONE 20 MG PO TAB [6496]: 40 mg | ORAL | @ 14:00:00 | NDC 00054001820

## 2019-11-28 MED ADMIN — BUMETANIDE 0.25 MG/ML IJ SOLN [9308]: 2 mg | INTRAVENOUS | @ 21:00:00 | Stop: 2019-11-28 | NDC 00409141234

## 2019-11-28 MED ADMIN — GABAPENTIN 400 MG PO CAP [18307]: 400 mg | ORAL | @ 02:00:00 | NDC 00904666761

## 2019-11-28 MED ADMIN — SODIUM CHLORIDE 0.9 % IV SOLP [27838]: 1250 mg | INTRAVENOUS | @ 03:00:00 | NDC 00338004902

## 2019-11-29 MED ADMIN — IPRATROPIUM-ALBUTEROL 0.5 MG-3 MG(2.5 MG BASE)/3 ML IN NEBU [77459]: 3 mL | RESPIRATORY_TRACT | @ 01:00:00 | NDC 69097084034

## 2019-11-29 MED ADMIN — BUMETANIDE 0.25 MG/ML IJ SOLN [9308]: 2 mg | INTRAVENOUS | @ 12:00:00 | Stop: 2019-11-29 | NDC 00409141234

## 2019-11-29 MED ADMIN — BENZONATATE 100 MG PO CAP [988]: 200 mg | ORAL | @ 16:00:00 | NDC 42806071401

## 2019-11-29 MED ADMIN — IPRATROPIUM-ALBUTEROL 0.5 MG-3 MG(2.5 MG BASE)/3 ML IN NEBU [77459]: 3 mL | RESPIRATORY_TRACT | @ 22:00:00 | NDC 69097084034

## 2019-11-29 MED ADMIN — SODIUM CHLORIDE 0.9 % IV SOLP [27838]: 1750 mg | INTRAVENOUS | @ 14:00:00 | NDC 00338004902

## 2019-11-29 MED ADMIN — PREDNISONE 20 MG PO TAB [6496]: 40 mg | ORAL | @ 14:00:00 | NDC 00054001820

## 2019-11-29 MED ADMIN — VANCOMYCIN 5 GRAM IV SOLR [8444]: 1750 mg | INTRAVENOUS | @ 14:00:00 | NDC 00409650901

## 2019-11-29 MED ADMIN — BUMETANIDE 0.25 MG/ML IJ SOLN [9308]: 1 mg | INTRAVENOUS | @ 20:00:00 | Stop: 2019-11-29 | NDC 00409141234

## 2019-11-29 MED ADMIN — GABAPENTIN 400 MG PO CAP [18307]: 400 mg | ORAL | @ 14:00:00 | NDC 00904666761

## 2019-11-29 MED ADMIN — IPRATROPIUM-ALBUTEROL 0.5 MG-3 MG(2.5 MG BASE)/3 ML IN NEBU [77459]: 3 mL | RESPIRATORY_TRACT | @ 11:00:00 | NDC 69097084034

## 2019-11-29 MED ADMIN — CEFTRIAXONE INJ 2GM IVP [210254]: 2 g | INTRAVENOUS | @ 18:00:00 | NDC 00781320990

## 2019-11-29 MED ADMIN — BUDESONIDE 0.5 MG/2 ML IN NBSP [81378]: 1 mg | RESPIRATORY_TRACT | @ 02:00:00 | NDC 00487970101

## 2019-11-29 MED ADMIN — IPRATROPIUM-ALBUTEROL 0.5 MG-3 MG(2.5 MG BASE)/3 ML IN NEBU [77459]: 3 mL | RESPIRATORY_TRACT | @ 17:00:00 | NDC 69097084034

## 2019-11-29 MED ADMIN — MAGNESIUM CITRATE PO SOLN [4711]: 296 mL | ORAL | @ 03:00:00 | Stop: 2019-11-29 | NDC 00904678744

## 2019-11-29 MED ADMIN — GABAPENTIN 400 MG PO CAP [18307]: 400 mg | ORAL | @ 01:00:00 | NDC 00904666761

## 2019-11-29 MED ADMIN — BISACODYL 5 MG PO TBEC [13632]: 5 mg | ORAL | @ 14:00:00 | NDC 00904640761

## 2019-11-29 MED ADMIN — BUDESONIDE 0.5 MG/2 ML IN NBSP [81378]: 1 mg | RESPIRATORY_TRACT | @ 11:00:00 | NDC 00487970101

## 2019-11-30 MED ADMIN — IPRATROPIUM-ALBUTEROL 0.5 MG-3 MG(2.5 MG BASE)/3 ML IN NEBU [77459]: 3 mL | RESPIRATORY_TRACT | @ 02:00:00 | NDC 69097084034

## 2019-11-30 MED ADMIN — CEFTRIAXONE INJ 2GM IVP [210254]: 2 g | INTRAVENOUS | @ 18:00:00 | NDC 00781320990

## 2019-11-30 MED ADMIN — BUMETANIDE 0.25 MG/ML IJ SOLN [9308]: 2 mg | INTRAVENOUS | @ 16:00:00 | Stop: 2019-11-30 | NDC 00409141234

## 2019-11-30 MED ADMIN — IPRATROPIUM-ALBUTEROL 0.5 MG-3 MG(2.5 MG BASE)/3 ML IN NEBU [77459]: 3 mL | RESPIRATORY_TRACT | @ 22:00:00 | NDC 69097084034

## 2019-11-30 MED ADMIN — IPRATROPIUM-ALBUTEROL 0.5 MG-3 MG(2.5 MG BASE)/3 ML IN NEBU [77459]: 3 mL | RESPIRATORY_TRACT | @ 18:00:00 | NDC 69097084034

## 2019-11-30 MED ADMIN — SODIUM CHLORIDE 0.9 % IV SOLP [27838]: 1750 mg | INTRAVENOUS | @ 14:00:00 | NDC 00338004902

## 2019-11-30 MED ADMIN — VANCOMYCIN 5 GRAM IV SOLR [8444]: 1750 mg | INTRAVENOUS | @ 14:00:00 | NDC 00409650901

## 2019-11-30 MED ADMIN — BUDESONIDE 0.5 MG/2 ML IN NBSP [81378]: 1 mg | RESPIRATORY_TRACT | @ 02:00:00 | NDC 00487970101

## 2019-11-30 MED ADMIN — TRAZODONE 50 MG PO TAB [8085]: 25 mg | ORAL | @ 03:00:00 | NDC 00904686861

## 2019-11-30 MED ADMIN — GABAPENTIN 400 MG PO CAP [18307]: 400 mg | ORAL | @ 01:00:00 | NDC 00904666761

## 2019-11-30 MED ADMIN — BUDESONIDE 0.5 MG/2 ML IN NBSP [81378]: 0.5 mg | RESPIRATORY_TRACT | @ 14:00:00 | NDC 00487970101

## 2019-11-30 MED ADMIN — BENZONATATE 100 MG PO CAP [988]: 200 mg | ORAL | @ 01:00:00 | NDC 42806071401

## 2019-11-30 MED ADMIN — GABAPENTIN 400 MG PO CAP [18307]: 400 mg | ORAL | @ 14:00:00 | NDC 00904666761

## 2019-11-30 MED ADMIN — IPRATROPIUM-ALBUTEROL 0.5 MG-3 MG(2.5 MG BASE)/3 ML IN NEBU [77459]: 3 mL | RESPIRATORY_TRACT | @ 14:00:00 | NDC 69097084034

## 2019-11-30 MED ADMIN — BUMETANIDE 0.25 MG/ML IJ SOLN [9308]: 2 mg | INTRAVENOUS | @ 22:00:00 | Stop: 2019-11-30 | NDC 00409141234

## 2019-11-30 MED ADMIN — BENZONATATE 100 MG PO CAP [988]: 200 mg | ORAL | @ 14:00:00 | NDC 42806071401

## 2019-11-30 MED ADMIN — PREDNISONE 20 MG PO TAB [6496]: 40 mg | ORAL | @ 14:00:00 | NDC 00054001820

## 2019-12-01 ENCOUNTER — Encounter: Admit: 2019-12-01 | Discharge: 2019-12-01 | Payer: MEDICARE

## 2019-12-01 MED ADMIN — BENZONATATE 100 MG PO CAP [988]: 200 mg | ORAL | @ 01:00:00 | NDC 42806071401

## 2019-12-01 MED ADMIN — PREDNISONE 20 MG PO TAB [6496]: 40 mg | ORAL | @ 13:00:00 | Stop: 2019-12-01 | NDC 00054001820

## 2019-12-01 MED ADMIN — IPRATROPIUM-ALBUTEROL 0.5 MG-3 MG(2.5 MG BASE)/3 ML IN NEBU [77459]: 3 mL | RESPIRATORY_TRACT | @ 18:00:00 | NDC 69097084034

## 2019-12-01 MED ADMIN — GUAIFENESIN 100 MG/5 ML PO LIQD [79220]: 200 mg | ORAL | @ 16:00:00 | NDC 00121174405

## 2019-12-01 MED ADMIN — IPRATROPIUM-ALBUTEROL 0.5 MG-3 MG(2.5 MG BASE)/3 ML IN NEBU [77459]: 3 mL | RESPIRATORY_TRACT | @ 02:00:00 | NDC 69097084034

## 2019-12-01 MED ADMIN — BENZONATATE 100 MG PO CAP [988]: 200 mg | ORAL | @ 13:00:00 | NDC 42806071401

## 2019-12-01 MED ADMIN — TRAZODONE 50 MG PO TAB [8085]: 25 mg | ORAL | @ 01:00:00 | NDC 00904686861

## 2019-12-01 MED ADMIN — BUMETANIDE 2 MG PO TAB [9311]: 2 mg | ORAL | @ 22:00:00 | NDC 60687053511

## 2019-12-01 MED ADMIN — VANCOMYCIN 5 GRAM IV SOLR [8444]: 1750 mg | INTRAVENOUS | @ 13:00:00 | Stop: 2019-12-01 | NDC 00409650901

## 2019-12-01 MED ADMIN — BUMETANIDE 0.25 MG/ML IJ SOLN [9308]: 2 mg | INTRAVENOUS | @ 17:00:00 | Stop: 2019-12-01 | NDC 00409141234

## 2019-12-01 MED ADMIN — BUDESONIDE 0.5 MG/2 ML IN NBSP [81378]: 1 mg | RESPIRATORY_TRACT | @ 13:00:00 | NDC 00487970101

## 2019-12-01 MED ADMIN — GABAPENTIN 400 MG PO CAP [18307]: 400 mg | ORAL | @ 13:00:00 | NDC 00904666761

## 2019-12-01 MED ADMIN — GABAPENTIN 400 MG PO CAP [18307]: 400 mg | ORAL | @ 01:00:00 | NDC 00904666761

## 2019-12-01 MED ADMIN — IPRATROPIUM-ALBUTEROL 0.5 MG-3 MG(2.5 MG BASE)/3 ML IN NEBU [77459]: 3 mL | RESPIRATORY_TRACT | @ 13:00:00 | NDC 69097084034

## 2019-12-01 MED ADMIN — BUDESONIDE 0.5 MG/2 ML IN NBSP [81378]: 1 mg | RESPIRATORY_TRACT | @ 02:00:00 | NDC 00487970101

## 2019-12-01 MED ADMIN — CEFTRIAXONE INJ 2GM IVP [210254]: 2 g | INTRAVENOUS | @ 17:00:00 | NDC 00781320990

## 2019-12-01 NOTE — Telephone Encounter
Patient is admitted for CHF exacerbation and is having a cardiac cath tomorrow. Will cancel and do his spine procedure at a later date.

## 2019-12-02 ENCOUNTER — Encounter: Admit: 2019-12-02 | Discharge: 2019-12-02 | Payer: MEDICARE

## 2019-12-02 MED ADMIN — GABAPENTIN 400 MG PO CAP [18307]: 400 mg | ORAL | @ 01:00:00 | NDC 00904666761

## 2019-12-02 MED ADMIN — GABAPENTIN 400 MG PO CAP [18307]: 400 mg | ORAL | @ 10:00:00 | NDC 00904666761

## 2019-12-02 MED ADMIN — BUDESONIDE 0.5 MG/2 ML IN NBSP [81378]: 1 mg | RESPIRATORY_TRACT | @ 14:00:00 | NDC 00487970101

## 2019-12-02 MED ADMIN — GUAIFENESIN 100 MG/5 ML PO LIQD [79220]: 200 mg | ORAL | @ 01:00:00 | NDC 00121174405

## 2019-12-02 MED ADMIN — IPRATROPIUM-ALBUTEROL 0.5 MG-3 MG(2.5 MG BASE)/3 ML IN NEBU [77459]: 3 mL | RESPIRATORY_TRACT | @ 19:00:00 | NDC 69097084034

## 2019-12-02 MED ADMIN — TRAZODONE 50 MG PO TAB [8085]: 25 mg | ORAL | @ 01:00:00 | NDC 00904686861

## 2019-12-02 MED ADMIN — GUAIFENESIN 100 MG/5 ML PO LIQD [79220]: 200 mg | ORAL | @ 10:00:00 | NDC 00121174405

## 2019-12-02 MED ADMIN — IPRATROPIUM-ALBUTEROL 0.5 MG-3 MG(2.5 MG BASE)/3 ML IN NEBU [77459]: 3 mL | RESPIRATORY_TRACT | @ 14:00:00 | NDC 69097084034

## 2019-12-02 MED ADMIN — CEFTRIAXONE INJ 2GM IVP [210254]: 2 g | INTRAVENOUS | @ 18:00:00 | NDC 00781320990

## 2019-12-02 MED ADMIN — BUDESONIDE 0.5 MG/2 ML IN NBSP [81378]: 1 mg | RESPIRATORY_TRACT | @ 02:00:00 | NDC 00487970101

## 2019-12-02 MED ADMIN — IPRATROPIUM-ALBUTEROL 0.5 MG-3 MG(2.5 MG BASE)/3 ML IN NEBU [77459]: 3 mL | RESPIRATORY_TRACT | @ 02:00:00 | NDC 69097084034

## 2019-12-02 MED ADMIN — BENZONATATE 100 MG PO CAP [988]: 200 mg | ORAL | @ 21:00:00 | NDC 42806071401

## 2019-12-02 MED ADMIN — IPRATROPIUM-ALBUTEROL 0.5 MG-3 MG(2.5 MG BASE)/3 ML IN NEBU [77459]: 3 mL | RESPIRATORY_TRACT | @ 22:00:00 | NDC 69097084034

## 2019-12-02 MED ADMIN — PREDNISONE 20 MG PO TAB [6496]: 20 mg | ORAL | @ 14:00:00 | Stop: 2019-12-07 | NDC 00054001820

## 2019-12-02 MED ADMIN — BENZONATATE 100 MG PO CAP [988]: 200 mg | ORAL | @ 10:00:00 | NDC 42806071401

## 2019-12-02 MED ADMIN — BENZONATATE 100 MG PO CAP [988]: 200 mg | ORAL | @ 01:00:00 | NDC 42806071401

## 2019-12-03 ENCOUNTER — Encounter: Admit: 2019-12-03 | Discharge: 2019-12-03 | Payer: MEDICARE

## 2019-12-03 MED ADMIN — GABAPENTIN 400 MG PO CAP [18307]: 400 mg | ORAL | @ 14:00:00 | Stop: 2019-12-03 | NDC 00904666761

## 2019-12-03 MED ADMIN — PREDNISONE 20 MG PO TAB [6496]: 20 mg | ORAL | @ 14:00:00 | Stop: 2019-12-03 | NDC 60687014511

## 2019-12-03 MED ADMIN — BUDESONIDE 0.5 MG/2 ML IN NBSP [81378]: 0.5 mg | RESPIRATORY_TRACT | @ 14:00:00 | Stop: 2019-12-03 | NDC 00487970101

## 2019-12-03 MED ADMIN — BUDESONIDE 0.5 MG/2 ML IN NBSP [81378]: 1 mg | RESPIRATORY_TRACT | @ 02:00:00 | NDC 00487970101

## 2019-12-03 MED ADMIN — IPRATROPIUM-ALBUTEROL 0.5 MG-3 MG(2.5 MG BASE)/3 ML IN NEBU [77459]: 3 mL | RESPIRATORY_TRACT | @ 17:00:00 | Stop: 2019-12-03 | NDC 69097084034

## 2019-12-03 MED ADMIN — GABAPENTIN 400 MG PO CAP [18307]: 400 mg | ORAL | @ 01:00:00 | NDC 00904666761

## 2019-12-03 MED ADMIN — BUMETANIDE 2 MG PO TAB [9311]: 2 mg | ORAL | @ 14:00:00 | Stop: 2019-12-03 | NDC 50268013211

## 2019-12-03 MED ADMIN — TRAZODONE 50 MG PO TAB [8085]: 25 mg | ORAL | @ 01:00:00 | NDC 00904686861

## 2019-12-03 MED ADMIN — GUAIFENESIN 100 MG/5 ML PO LIQD [79220]: 200 mg | ORAL | @ 12:00:00 | Stop: 2019-12-03 | NDC 00121174405

## 2019-12-03 MED ADMIN — FLU VACC QS2021-22(65YR UP)-PF 240 MCG/0.7 ML IM SYRG [457008]: 0.7 mL | INTRAMUSCULAR | @ 18:00:00 | Stop: 2019-12-03 | NDC 49281012188

## 2019-12-03 MED ADMIN — GUAIFENESIN 100 MG/5 ML PO LIQD [79220]: 200 mg | ORAL | @ 01:00:00 | NDC 00121174405

## 2019-12-03 MED ADMIN — IPRATROPIUM-ALBUTEROL 0.5 MG-3 MG(2.5 MG BASE)/3 ML IN NEBU [77459]: 3 mL | RESPIRATORY_TRACT | @ 14:00:00 | Stop: 2019-12-03 | NDC 69097084034

## 2019-12-03 MED ADMIN — IPRATROPIUM-ALBUTEROL 0.5 MG-3 MG(2.5 MG BASE)/3 ML IN NEBU [77459]: 3 mL | RESPIRATORY_TRACT | @ 02:00:00 | NDC 69097084034

## 2019-12-03 MED ADMIN — BENZONATATE 100 MG PO CAP [988]: 200 mg | ORAL | @ 01:00:00 | NDC 42806071401

## 2019-12-03 MED ADMIN — BENZONATATE 100 MG PO CAP [988]: 200 mg | ORAL | @ 12:00:00 | Stop: 2019-12-03 | NDC 42806071401

## 2019-12-03 NOTE — Telephone Encounter
Message received from [facility] with [facility] requesting clarification for prednisone order they received. Script sent with sig: 10 mg tabs. Take two tablets by mouth daily for 3 days, THEN two tablets daily for 5 days.

## 2019-12-04 ENCOUNTER — Encounter: Admit: 2019-12-04 | Discharge: 2019-12-04 | Payer: MEDICARE

## 2019-12-04 NOTE — Telephone Encounter
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  Discharge Diagnosis: (Principal)Acute on chronic combined systolic and diastolic heart failure, OSA on CPAP, COPD, Morbid obesity with BMI of 40.0-44.9 adult, Essential hypertension, CAD native coronary artery, Cellulitis of left lower extremity, ICD biventriuclar in situ, Ischemic ardiomyopathy, 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 No  If yes, reason: N/A  Hospital Services: Unplanned  Today's call is 1(business) days post discharge      Discharge Instruction Review   Did patient receive and understand discharge instructions? Yes    Home Health ordered? No                 Agency name/telephone number: N/A   Has Home Health agency contacted patient? No   Caregiver assistance in the home? No   Are there concerns regarding the patient's ADL'S? No  Is patient a fall risk? Yes    Special diet? No If yes, type: Patient states he has not been following the cardiac or diabetic diet at home and has been eating a regular diet      Medication Reconciliation    Changes to pre-hospital medications? Yes   CHANGE how you take:  bumetanide (BUMEX)  JARDIANCE  spironolactone (ALDACTONE)  STOP taking:  cephalexin 500 mg capsule (KEFLEX)  gabapentin 400 mg capsule (NEURONTIN)  levofloxacin 750 mg tablet (LEVAQUIN)  Were new prescriptions filled?Yes   START taking:  prednisone (DELTASONE) Start taking on: December 04, 2019  Meds reviewed and reconciled?Yes  ? acetaminophen (TYLENOL) 325 mg tablet Take two tablets by mouth every 6 hours as needed for Pain.   ? albuterol 0.083% (PROVENTIL) 2.5 mg /3 mL (0.083 %) nebulizer solution Inhale 3 mL solution by nebulizer as directed every 6 hours as needed for Wheezing or Shortness of Breath. Indications: asthma (Patient taking differently: Inhale 3 mL solution by nebulizer as directed three times daily as needed for Wheezing or Shortness of Breath. Indications: asthma)   ? albuterol sulfate (PROAIR HFA) 90 mcg/actuation aerosol inhaler Inhale two puffs by mouth into the lungs every 4 hours as needed for Wheezing or Shortness of Breath.   ? allopurinoL (ZYLOPRIM) 300 mg tablet Take 300 mg by mouth daily. Take with food.   ? amitriptyline/gabapentin/emu oil(#) (AMITRIPTYLINE/GABAPENTIN/EMU OIL(#)) 05/24/08 % Apply  topically to affected area twice daily as needed.   ? ASCORBIC ACID-ASCORBATE SODIUM PO Take 500 mg by mouth every morning.   ? aspirin EC 81 mg tablet Take one tablet by mouth daily. Take with food.   ? atorvastatin (LIPITOR) 40 mg tablet TAKE (1) TABLET BY MOUTH ONCE DAILY   ? benzonatate (TESSALON PERLES) 100 mg capsule Take 100 mg by mouth every 8 hours.   ? budesonide-formoterol (SYMBICORT HFA) 160-4.5 mcg/actuation inhalation Inhale two puffs by mouth into the lungs twice daily. Rinse mouth after use.   ? bumetanide (BUMEX) 2 mg tablet Take one tablet by mouth  twice daily.   ? cholecalciferol (VITAMIN D-3) 1,000 units tablet Take 1,000 Units by mouth daily.   ? duloxetine DR (CYMBALTA) 60 mg capsule Take one capsule by mouth daily.   ? empagliflozin (JARDIANCE) 25 mg tablet Take one tablet by mouth daily.   ? finasteride (PROSCAR) 5 mg tablet Take one tablet by mouth daily.   ? fish oil- omega 3-DHA/EPA 300/1,000 mg capsule Take 1 capsule by mouth daily.   ? ipratropium (ATROVENT) 0.03 % nasal spray Apply two sprays to each nostril as directed every 12 hours.   ? Miscellaneous Medical Supply misc Rx: Please wrap legs with ACE bandage from toes as high up to the leg as possible bilaterally  Dx: Lymphedema   ? montelukast (SINGULAIR) 10 mg tablet Take one tablet by mouth at bedtime daily.   ? nitroglycerin (NITROSTAT) 0.4 mg tablet Place 0.4 mg under tongue every 5 minutes as needed for Chest Pain. Max of 3 tablets, call 911.   ? pantoprazole DR (PROTONIX) 40 mg tablet TAKE (1) TABLET BY MOUTH TWO TIMES A DAY.   ? predniSONE (DELTASONE) 10 mg tablet Take two tablets by mouth daily for 3 days, THEN two tablets daily for 5 days.   ? sodium chloride 3 % nebulizer solution Inhale 4 mL by mouth into the lungs twice daily. Use with [place].  Indications: asthma, chronic cough, bronchiectasis (Patient taking differently: Inhale 4 mL by mouth into the lungs three times daily. Use with [place].  Indications: asthma, chronic cough, bronchiectasis)   ? spironolactone (ALDACTONE) 25 mg tablet Take one tablet by mouth daily. Take with food.   ? tamsulosin (FLOMAX) 0.4 mg capsule Take one capsule by mouth daily.   ? traMADoL (ULTRAM) 50 mg tablet Take one tablet by mouth every 12 hours as needed for Pain.   ? zinc sulfate 220 mg (50 mg elemental zinc) capsule Take 220 mg by mouth daily.         Understanding Condition   Having any current symptoms? Yes, Patient states he still has a productive cough with yellow sputum but denies fever/chills, nausea/vomiting, diarrhea/constipation.  Patient states he is not short of breath so far today but really hasn't been moving around.  Patient states that his edema in his left leg has decreased and states it looks good.  Patient denies weighing himself this morning because he states he already ate and states it wouldnt be accurate.  Patient denies checking his blood pressure at home.  Patient denies chest pain, palpitations, nausea/vomiting, fever/chills, diarrhea/constipation.  Patient understands when to seek additional medical care? Yes   Other instructions provided :    Call your doctor (your cardiologist, if you have one) if:  -you gain more than 2 pounds in 24 hours.  -you gain more than 5 pounds in 1 week.  -any of your symptoms get worse     Scheduling Follow-up Appointment   Upcoming appointment date and time and with whom scheduled:   Future Appointments   Date Time Provider Department Center   12/08/2019 12:00 AM [facility] C[facility] Procedur   12/08/2019 10:15 AM Harley Alto, MD [facility] CVM Exam   12/08/2019 11:30 AM Fredricka Bonine, APRN-NP [facility] CVM Exam   01/08/2020 12:00 AM [facility] CVM Procedur   02/09/2020 12:00 AM [facility] CVM Procedur   03/11/2020 12:00 AM [facility] CVM Procedur   03/12/2020 11:20 AM Deveron Furlong, MD [facility] IM   04/12/2020  12:00 AM [facility] CVM Procedur   05/13/2020 12:00 AM [facility] CVM Procedur   05/19/2020 11:15 AM [name], Lenice Llamas, PA-C [facility] Urology   06/14/2020 12:00 AM [facility] CVM Procedur     PCP appointment scheduled?No, patient only wants to see pcp listed and no openings for telehealth or in person appointments until january.  Sending message to pcp and his nurse to advise for appointment.  PCP primary location: [facility]  Specialist appointment scheduled? Yes, with Cardiology 12/08/2019 and 12/08/2019; Pulmonary 03/12/2020; Urology 05/19/2020  Both PCP and Specialist appointment scheduled: No  Is assistance with transportation needed?No   MyChart message sent? Active in MyChart. No message sent.     [name], RN

## 2019-12-08 ENCOUNTER — Encounter: Admit: 2019-12-08 | Discharge: 2019-12-08 | Payer: MEDICARE

## 2019-12-08 ENCOUNTER — Ambulatory Visit: Admit: 2019-12-08 | Discharge: 2019-12-08 | Payer: MEDICARE

## 2019-12-08 DIAGNOSIS — J479 Bronchiectasis, uncomplicated: Secondary | ICD-10-CM

## 2019-12-08 DIAGNOSIS — I714 Abdominal aortic aneurysm, without rupture: Secondary | ICD-10-CM

## 2019-12-08 DIAGNOSIS — Z8679 Personal history of other diseases of the circulatory system: Secondary | ICD-10-CM

## 2019-12-08 DIAGNOSIS — I5042 Chronic combined systolic (congestive) and diastolic (congestive) heart failure: Secondary | ICD-10-CM

## 2019-12-08 DIAGNOSIS — M961 Postlaminectomy syndrome, not elsewhere classified: Secondary | ICD-10-CM

## 2019-12-08 DIAGNOSIS — I509 Heart failure, unspecified: Secondary | ICD-10-CM

## 2019-12-08 DIAGNOSIS — Z8614 Personal history of Methicillin resistant Staphylococcus aureus infection: Secondary | ICD-10-CM

## 2019-12-08 DIAGNOSIS — R609 Edema, unspecified: Secondary | ICD-10-CM

## 2019-12-08 DIAGNOSIS — J45909 Unspecified asthma, uncomplicated: Secondary | ICD-10-CM

## 2019-12-08 DIAGNOSIS — J189 Pneumonia, unspecified organism: Secondary | ICD-10-CM

## 2019-12-08 DIAGNOSIS — I10 Essential (primary) hypertension: Secondary | ICD-10-CM

## 2019-12-08 DIAGNOSIS — L03116 Cellulitis of left lower limb: Secondary | ICD-10-CM

## 2019-12-08 DIAGNOSIS — Z95828 Presence of other vascular implants and grafts: Secondary | ICD-10-CM

## 2019-12-08 DIAGNOSIS — I2510 Atherosclerotic heart disease of native coronary artery without angina pectoris: Secondary | ICD-10-CM

## 2019-12-08 DIAGNOSIS — N401 Enlarged prostate with lower urinary tract symptoms: Secondary | ICD-10-CM

## 2019-12-08 DIAGNOSIS — N183 CKD (chronic kidney disease) stage 3, GFR 30-59 ml/min (HCC): Secondary | ICD-10-CM

## 2019-12-08 DIAGNOSIS — K219 Gastro-esophageal reflux disease without esophagitis: Secondary | ICD-10-CM

## 2019-12-08 DIAGNOSIS — I429 Cardiomyopathy, unspecified: Secondary | ICD-10-CM

## 2019-12-08 DIAGNOSIS — R053 Chronic cough: Secondary | ICD-10-CM

## 2019-12-08 DIAGNOSIS — Z9981 Dependence on supplemental oxygen: Secondary | ICD-10-CM

## 2019-12-08 DIAGNOSIS — J449 Chronic obstructive pulmonary disease, unspecified: Secondary | ICD-10-CM

## 2019-12-08 DIAGNOSIS — R0600 Dyspnea, unspecified: Secondary | ICD-10-CM

## 2019-12-08 DIAGNOSIS — M954 Acquired deformity of chest and rib: Secondary | ICD-10-CM

## 2019-12-08 DIAGNOSIS — G4733 Obstructive sleep apnea (adult) (pediatric): Secondary | ICD-10-CM

## 2019-12-08 DIAGNOSIS — J302 Other seasonal allergic rhinitis: Secondary | ICD-10-CM

## 2019-12-08 DIAGNOSIS — E782 Mixed hyperlipidemia: Secondary | ICD-10-CM

## 2019-12-08 NOTE — Progress Notes
Patient no showed to appointment with Herby Abraham on 12/08/19 at 11:30am

## 2019-12-08 NOTE — Telephone Encounter
Call pt re missed appt today 12/08/19 with Herby Abraham ARNP, pt apologizes states he was unaware of appt.  Pt states he will be @ [place] tomorrow if he can reschedule, he lives 2.5 hrs away.  Per Dnan ok to sched with her tomnorrow, per [employer] tomorrow will work for appt.  Advised pt appt tomorrow 12/09/19 @ 1130 with dana Hyacinth Meeker ARNP, pt verb understanding.

## 2019-12-08 NOTE — Progress Notes
Date of Service: 12/08/2019    Ruben Wood. Ruben Wood is a 79 y.o. male.       HPI       Ruben Wood comes today for follow-up visit.  He was just discharged from [facility] after being admitted for what appeared to have been concerns for significantly decompensated congestive heart failure.  There was also concerned that his CardioMEMS device might need to be recalibrated as the numbers were pretty normal/in his symptoms of congestion and edema are more concerning for worsening disease in terms of heart failure.  The whole picture was complicated further by his having had Covid and requiring high-dose dexamethasone for Covid fibroproliferative pulmonary disorder.    Anyhow the right heart catheterization indicated stable hemodynamics with low normal cardiac index and cardiac output and the filling pressures were not too high.  There were however significant swings with respiration.  He was aggressively diuresed which resulted in significant weight loss of fluid and today he reports that he is a remarkably new man and feeling a lot more energetic.  He reports no orthopnea or PND and only a little bit of edema.  His blood pressure at home stays well controlled and is not reporting any angina.    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  While in the hospital last week he had a venous duplex that showed no acute DVT and did show evidence of some reflux disease.  His arterial duplex showed monophasic waveforms in the left lower extremity that were somewhat concerning for a more proximal iliac artery stenosis or occlusion.  The patient however does not report any claudication-like symptoms or discomfort in the left leg or discoloration.    Past medical history:    1. Coronary artery disease, CAD with prior stents  2. Ischemic cardiomyopathy with most recent EF 40 to 45%  3. Stage III kidney disease  4. Mixed dyslipidemia  5. COPD with chronic bronchiectasis  6. Obstructive sleep apnea  7. Chronic venous stasis  8. Hypertension  9. Has previously seen Dr. 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 Abdominal aortic aneurysm status post repair         Vitals:    12/08/19 1009   BP: (!) 142/68   BP Source: Arm, Left Upper   Patient Position: Sitting   Pulse: 54   SpO2: 99%   Weight: (!) 137.4 kg (303 lb)   Height: 1.803 m (5' 11)   PainSc: Zero     Body mass index is 42.26 kg/m?[name]     Past Medical History  Patient Active Problem List    Diagnosis Date Noted   ? Complex care coordination 05/04/2017     Priority: High     Class: Acute   ? 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 Acute on chronic combined systolic (congestive) and diastolic (congestive) heart failure (HCC) 11/25/2019   ? Chronic bronchitis (HCC) 11/25/2019   ? Pneumonia due to COVID-19 virus 12/05/2018   ? Chronic bronchitis with acute exacerbation (HCC) 12/05/2018   ? COVID-19 virus infection 12/05/2018   ? 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 Type 2 diabetes mellitus with hyperglycemia, without long-term current use of insulin (HCC) 03/12/2018   ? Atrial fibrillation (HCC) 03/12/2018   ? Morbid  obesity (HCC) 11/16/2017   ? Chronic respiratory failure with hypercapnia (HCC) 11/16/2017   ? Depression 11/15/2017   ? OAB (overactive bladder)      Urinary frequency, urgency, urge urinary incontinence (UUI), nocturia, nocturnal enuresis.  -- trial Mirabegron 25 mg --> improved, but persistent sx's.  -- trial Mirabegron 50 mg.     ? Urinary incontinence, urge      See OAB A&P note.     ? Coronary artery disease due to lipid rich plaque 06/29/2017   ? Bronchiectasis with acute lower respiratory infection (HCC) 05/10/2017   ? 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     ? 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  D/c'd Tamsulosin d/t lack of symptom improvement.     ? Enrolled in chronic care management 02/28/2017   ? History of elevated prostate specific antigen (PSA)      PNBx (03/01/2017): (L) high-grade prostatic intraepithelial neoplasia (HG PIN), 1/6 cores; [name], PA-C.     ? Spondylolisthesis, lumbar region 12/13/2016   ? Lumbar post-laminectomy syndrome      L4-5     ? Spinal stenosis of lumbosacral region 09/20/2016   ? Spondylolisthesis of lumbosacral region 09/20/2016   ? Osteoarthritis of spine with radiculopathy, lumbosacral region 09/20/2016   ? 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 He had a normal response to pneumococca vaccination; he had 17/23 positive serotypes. He has normal tetanus toxoid Ab and CH50.  His T&B cell panel revealed a mildly low B-cell count at 68 right after a time of acute illness.  He had positive diphtheria antibody.    Likely multifactorial with significant comorbidities including frequent steroid use for bronchiectasis exacerbations, frequent infections, and possibly a nutritional component (low total protein).    Currently on doxycycline once daily for recurrent infections, which he feels has been tremendously helpful.     Plan:  - Continue doxycycline 100 mg daily  - Recommend getting the pneumovax immunization next time he is in clinic in person and will need repeat pneumococcal antibody levels 1 month afterwards once he has had resolution of his COVID issues.          ? Moderate persistent asthma with acute exacerbation 02/25/2016     He has had asthma since sometime in his 20-30s. He gets cough, chest tightness, wheezing, shortness of breath that is year round with worsening in the Spring and Fall.   Triggers: URI, hot/cold air. Additionally he has been diagnosed with bronchiectasis and emphysema; his PFTs suggest both obstructive and restrictive disease and asthma is unlikely to be the sole cause of his dyspnea.    He has negative IgE immunocaps to aeroallergens which makes the possibility of allergic asthma highly unlikely. His IgE level was 17 and he had negative ANCAs, MPO and PR3.     Repeatedly being treated with steroids now for flares.  Also on hypertonic saline 3 times a day although not currently using them), DuoNebs, Symbicort, and Singulair as well as aggressive pulmonary clearance with vest and Aerobika.        ? Dermatographic urticaria 02/25/2016     Positive saline  reaction on SPT today. He is not having issues with urticaria.    - We recommend IgE immunocaps to aeroallergens.      ? Recurrent infections 02/25/2016     Recurrent sinopulmonary infections requiring antibiotics 5-10 times per year.   Cellulitis and non-healing wounds.   Many underlying illnesses predisposing him to these infections in addition to intermittent hypogammaglobulinemia associated with active infections and systemic steroids.      ? S/P left pulmonary artery pressure sensor implant placement (CardioMEMs)  02/02/2016     * Patient has CardioMEMs (Pulmonary Artery Pressure Sensor)* Please call Matthew Folks, CardioMEMs Program Coordinator [phone] or page Heart Failure Rounding Team if patient is admitted or presents to ED*  03/30/15 implant     ? Ischemic cardiomyopathy 01/12/2016   ? 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 ICD (implantable cardioverter-defibrillator), biventricular, in situ 12/07/2015   ? Hypercholesterolemia 11/25/2015   ? 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 Bronchiectasis without complication (HCC) 06/18/2015     Non-sputum producer.  Currently on Symbicort.  - Dr. [name] was able to get him a vest to wear at home for secretions and this has helped immensely with his secretions and breathing.      ? Cellulitis of left lower extremity 11/12/2014     10/2014: admitted with cellulitis, [place] negative for venous clot, MRI without osteomyelitis.      11/12/2014 In clinic today, still with cellulitis.  Per patient and his wife, the erythema may be extending.  Cultures from drainage in hospital with MSSA, but Blood Cx negative.  No e/o osteomyelitis.  My concern is that this antibiotic regimen is not adequately treating his cellulitis.  We will broaden coverage to get MRSA with doxycycline.  Patient and wife given strict call/return criteria.  I will see patient in 3-4 days for re-evaluation.  No fever today.   Plan:  Stop cefpodoxime and start doxycylcine    11/17/2014 Much better, no warmth and erythema minimal.    Plan: continue doxycyline for full 10 day course.        ? Osteoarthritis of spine with radiculopathy, lumbar region 07/28/2014   ? Chronic combined systolic and diastolic congestive heart failure, NYHA class 2 (HCC) 07/19/2014     11/04/14: EF 20%, severely dilated LV with grade 1 diastolic dysfunction.  11/12/2014 In clinic today, patient appears fairly well compensated.  He does have some LEE, but no significant rales (just some at bases), no JVD sitting upright, and no sx to suggest decompensation.  We will continue metoprolol, spironolactone, and bumex at current doses.    11/17/2014 Edema much improved in lower ext, Cr trending down on last labs.  Will recheck labs today.  Advised to weigh himself daily.  Rechecking BMP today to ensure not overdoing diuretics.     On metoprolol, spironolactone.  Will need to explore allergy to ARB more as patient may benefit from ACEI.      03/30/15 CardioMEMS implant by Dr. Chales Abrahams  1. Normal cardiac output and cardiac index.  2. Normal pulmonary pressures and normal pulmonary capillary wedge pressure.  3. Successful insertion of  CardioMEMS pulmonary artery pressure sensor     ? 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      ? Mixed dyslipidemia 08/26/2013     Hepatic Function    Lab Results   Component Value Date/Time    ALBUMIN 4.0 11/04/2014 12:26 AM    TOTAL PROTEIN 6.9 11/04/2014 12:26 AM    ALK PHOSPHATASE 61 11/04/2014 12:26 AM    Lab Results   Component Value Date/Time    AST (SGOT) 41* 11/04/2014 12:26 AM    ALT (SGPT) 19 11/04/2014 12:26 AM    TOTAL BILIRUBIN 1.4* 11/04/2014 12:26 AM        Lab Results   Component Value Date    CHOL 148 12/15/2013    TRIG 122 12/15/2013    HDL 51 12/15/2013    LDL 88 12/15/2013    VLDL 24 12/15/2013    NONHDLCHOL 97 12/15/2013       BP Readings from Last 3 Encounters:   11/12/14 129/80   11/09/14 111/44   11/04/14 146/65     Plan:   79 y.o. with known CAD with CTO of RCA and obtuse marginal branch with high grade stenosis (90%) in mid left circ.    Advised heart healthy diet and daily exercise.    Continue high intensity statin, atorvastatin       ? AAA (abdominal aortic aneurysm) (HCC) 10/15/2012     Duplex done at OSH in 2007 and 2008 ~ 4.1 cm    Duplex 10/15/12-AAA 7.3 cm AP x 7.3 cm       ? History of MRSA infection 10/14/2012   ? CAD (coronary artery disease), native coronary artery 08/15/2012     07/11/2002- Cath @ [facility] showed Severe double vessel disease(OMB of circumflex and distal circ)  inferior-basilar dyskinesis.                  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    09/12/12   Cath - [facility]:  CTO of the right coronary artery, CTO of the obtuse marginal branch and high-grade stenosis of   90% in the mid left circumflex artery No significant stenosis in the left anterior descending artery or left main vessel     Failed attempt in opening 2nd obtuse marginal CTO as described above.    10/14/12: Unsuccessful attempt to open CTO of OMB and mid-CFX by Dr. Mackey Birchwood      04/02/17: Cath by Dr. Steward Ros:   4. Chronic total occlusion of the circumflex.  5. Chronic total occlusion of the right coronary artery, status post percutaneous coronary intervention with drug-eluting stent times 2.  6. Small distal right coronary artery perforation without echo or hemodynamic evidence of compromise.    06/29/17-cardiac catheterization by Dr. Steward Ros. One DES placed to chronic total occlusion of the circumflex artery. The RCA stent was patent.     ? OSA on CPAP 08/13/2012     CPAP at  DME: [facility]    Split night:  08/14/2017  AHI 43.2  REM n/a  Time below 88 %: 98 mins  Lowest sat: 83%     CPAP 10 cm h20 effective      ? COPD (chronic obstructive pulmonary disease) (HCC) 08/13/2012     04/28/13: PFTs with more restrictive pattern with FEV1 50%, FEV1/FVC 89%, DLCO 65%.    12/27/13: PFTs with FEV1 82%, FEV1/FVC 113%, and DLCO 77%     ? 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  Discussed the importance of diet.  Exercise as tolerated. ? Essential hypertension 08/13/2012   ? Chest wall deformity 07/09/2012     Chest wall reconstruction on 07/09/12  Lung herniation- bio bridge           Review of Systems   All other systems reviewed and are negative.      Physical Exam    General Appearance: no acute distress, obese  Skin: warm &?intact ?  HEENT: EOMI, mucous membranes moist, oropharynx is clear ?  Neck Veins: neck veins are flat &?not distended ?  Carotid Arteries: no bruits ?  Chest Inspection: chest incisions are well healed ?  Auscultation/Percussion: lungs clear to auscultation, no rales, rhonchi, or wheezing ?  Cardiac Rhythm: regular rhythm &?normal rate ?  Cardiac Auscultation: distant heart tones, but otherwise normal S1 &?S2, no S3 or S4, no rub ?  Murmurs: no cardiac murmurs ?  Extremities:?1-2+ bilateral pedal edema  Abdominal Exam: obese, non-tender, no masses, bowel sounds+, nonpalpable abdominal aorta ?  Liver &?Spleen: difficult to assess due to obese body habitus ?  Neurologic Exam: oriented to time, place and person; no focal neurologic deficit      Cardiovascular Health Factors  Vitals BP Readings from Last 3 Encounters:   12/08/19 (!) 142/68   12/03/19 135/49   11/25/19 135/64     Wt Readings from Last 3 Encounters:   12/08/19 (!) 137.4 kg (303 lb)   12/03/19 (!) 137.6 kg (303 lb 6.4 oz)   11/25/19 (!) 138 kg (304 lb 3.2 oz)     BMI Readings from Last 3 Encounters:   12/08/19 42.26 kg/m?   12/03/19 42.32 kg/m?   11/25/19 42.43 kg/m?      Smoking Social History     Tobacco Use   Smoking Status Former Smoker   ? Packs/day: 2.00   ? Years: 40.00   ? Pack years: 80.00   ? Types: Cigarettes   ? Quit date: 07/09/1998   ? Years since quitting: 21.4   Smokeless Tobacco Never Used      Lipid Profile Cholesterol   Date Value Ref Range Status   12/05/2018 106 <200 MG/DL Final     HDL   Date Value Ref Range Status   12/05/2018 31 (L) >40 MG/DL Final     LDL   Date Value Ref Range Status   12/05/2018 60 <100 mg/dL Final     Triglycerides Date Value Ref Range Status   12/05/2018 108 <150 MG/DL Final      Blood Sugar Hemoglobin A1C   Date Value Ref Range Status   11/25/2019 5.9 4.0 - 6.0 % Final     Comment:  The ADA recommends that most patients with type 1 and type 2 diabetes maintain   an A1c level <7%.       Glucose   Date Value Ref Range Status   12/03/2019 129 (H) 70 - 100 MG/DL Final   16/11/9602 540 (H) 70 - 100 MG/DL Final   98/12/9145 829 (H) 70 - 100 MG/DL Final     Glucose Fasting   Date Value Ref Range Status   12/15/2014 111  Final     Glucose, POC   Date Value Ref Range Status   12/25/2018 202 (H) 70 - 100 MG/DL Final   56/21/3086 578 (H) 70 - 100 MG/DL Final   46/96/2952 841 (H) 70 - 100 MG/DL Final          Problems Addressed Today  Encounter Diagnoses   Name Primary?   ? Abdominal aortic aneurysm (AAA) without rupture (HCC) Yes   ? History of repair of aneurysm of abdominal aorta using endovascular stent graft    ? Coronary artery disease involving native coronary artery without angina pectoris, unspecified whether native or transplanted heart        Assessment and Plan     Cardiac issues: He appears well compensated today.  He might benefit from intermittent outpatient Lasix therapy in our infusion clinic.  I have discussed this with Dr. Sherryll Burger today.  He will continue to follow closely with our advanced heart failure colleagues including for coronary artery disease issues but I will keep an eye on that as well currently no medication changes    Obesity: His wife is concerned of his incessant snacking especially at night after dinner.  He is frustrated by himself and was asking if there is any medication that can stop his impulse to eat.  He will be visiting with Dr. Crecencio Mc tomorrow and I suggested he discuss this with him as I am not aware of any such medication.  He had significantly benefited by following with our physicians in the internal medicine weight loss program and I suggested he discuss that as well if it would be a good idea for him.    Abdominal aortic aneurysm: The sac has shrunk progressively in the last 7 years and I will repeat a noncontrast CT next year which will be 2 years from the last CT.    Concern for monophasic waveform in the left lower extremity arterial duplex: He has a good posterior tibial pulse today in the left leg and no symptoms L but with limited mobility because of his other issues.  We will continue conservative management.  I do not want to give him contrast load either with a CTA or with angiography at this time.    He will continue to follow closely with our advanced heart failure colleagues and I will see him back in follow-up in 1 year with a noncontrast CT of the abdomen and pelvis         Current Medications (including today's revisions)  ? acetaminophen (TYLENOL) 325 mg tablet Take two tablets by mouth every 6 hours as needed for Pain.   ? albuterol 0.083% (PROVENTIL) 2.5 mg /3 mL (0.083 %) nebulizer solution Inhale 3 mL solution by nebulizer as directed every 6 hours as needed for Wheezing or Shortness of Breath. Indications: asthma (Patient taking differently: Inhale 3 mL solution by nebulizer as directed three times daily as needed for Wheezing or Shortness of Breath. Indications: asthma)   ? albuterol sulfate (PROAIR HFA) 90 mcg/actuation aerosol  inhaler Inhale two puffs by mouth into the lungs every 4 hours as needed for Wheezing or Shortness of Breath.   ? allopurinoL (ZYLOPRIM) 300 mg tablet Take 300 mg by mouth daily. Take with food.   ? amitriptyline/gabapentin/emu oil(#) (AMITRIPTYLINE/GABAPENTIN/EMU OIL(#)) 05/24/08 % Apply  topically to affected area twice daily as needed.   ? ASCORBIC ACID-ASCORBATE SODIUM PO Take 500 mg by mouth every morning.   ? aspirin EC 81 mg tablet Take one tablet by mouth daily. Take with food.   ? atorvastatin (LIPITOR) 40 mg tablet TAKE (1) TABLET BY MOUTH ONCE DAILY   ? benzonatate (TESSALON PERLES) 100 mg capsule Take 100 mg by mouth every 8 hours.   ? budesonide-formoterol (SYMBICORT HFA) 160-4.5 mcg/actuation inhalation Inhale two puffs by mouth into the lungs twice daily. Rinse mouth after use.   ? bumetanide (BUMEX) 2 mg tablet Take one tablet by mouth twice daily.   ? cholecalciferol (VITAMIN D-3) 1,000 units tablet Take 1,000 Units by mouth daily.   ? duloxetine DR (CYMBALTA) 60 mg capsule Take one capsule by mouth daily.   ? empagliflozin (JARDIANCE) 25 mg tablet Take one tablet by mouth daily.   ? finasteride (PROSCAR) 5 mg tablet Take one tablet by mouth daily.   ? fish oil- omega 3-DHA/EPA 300/1,000 mg capsule Take 1 capsule by mouth daily.   ? ipratropium (ATROVENT) 0.03 % nasal spray Apply two sprays to each nostril as directed every 12 hours.   ? Miscellaneous Medical Supply misc Rx: Please wrap legs with ACE bandage from toes as high up to the leg as possible bilaterally  Dx: Lymphedema   ? montelukast (SINGULAIR) 10 mg tablet Take one tablet by mouth at bedtime daily.   ? nitroglycerin (NITROSTAT) 0.4 mg tablet Place 0.4 mg under tongue every 5 minutes as needed for Chest Pain. Max of 3 tablets, call 911.   ? pantoprazole DR (PROTONIX) 40 mg tablet TAKE (1) TABLET BY MOUTH TWO TIMES A DAY.   ? predniSONE (DELTASONE) 10 mg tablet Take two tablets by mouth daily for 3 days, THEN one tablet daily for 5 days.   ? sodium chloride 3 % nebulizer solution Inhale 4 mL by mouth into the lungs twice daily. Use with [place].  Indications: asthma, chronic cough, bronchiectasis (Patient taking differently: Inhale 4 mL by mouth into the lungs three times daily. Use with [place].  Indications: asthma, chronic cough, bronchiectasis)   ? spironolactone (ALDACTONE) 25 mg tablet Take one tablet by mouth daily. Take with food.   ? tamsulosin (FLOMAX) 0.4 mg capsule Take one capsule by mouth daily.   ? traMADoL (ULTRAM) 50 mg tablet Take one tablet by mouth every 12 hours as needed for Pain.   ? zinc sulfate 220 mg (50 mg elemental zinc) capsule Take 220 mg by mouth daily.

## 2019-12-08 NOTE — Progress Notes
Daily CardioMEMS Readings    Goal PA Diastolic: 55-20 mmHg   PA Diastolic Thresholds: 80-22 mmHg

## 2019-12-08 NOTE — Patient Instructions
1y F/U; CT abd/pelvis w/o contrast

## 2019-12-09 ENCOUNTER — Encounter: Admit: 2019-12-09 | Discharge: 2019-12-09 | Payer: MEDICARE

## 2019-12-09 ENCOUNTER — Ambulatory Visit: Admit: 2019-12-09 | Discharge: 2019-12-09 | Payer: MEDICARE

## 2019-12-09 DIAGNOSIS — I509 Heart failure, unspecified: Secondary | ICD-10-CM

## 2019-12-09 DIAGNOSIS — Z8679 Personal history of other diseases of the circulatory system: Secondary | ICD-10-CM

## 2019-12-09 DIAGNOSIS — R0600 Dyspnea, unspecified: Secondary | ICD-10-CM

## 2019-12-09 DIAGNOSIS — N401 Enlarged prostate with lower urinary tract symptoms: Secondary | ICD-10-CM

## 2019-12-09 DIAGNOSIS — Z95828 Presence of other vascular implants and grafts: Secondary | ICD-10-CM

## 2019-12-09 DIAGNOSIS — Z9981 Dependence on supplemental oxygen: Secondary | ICD-10-CM

## 2019-12-09 DIAGNOSIS — G4733 Obstructive sleep apnea (adult) (pediatric): Secondary | ICD-10-CM

## 2019-12-09 DIAGNOSIS — G629 Polyneuropathy, unspecified: Secondary | ICD-10-CM

## 2019-12-09 DIAGNOSIS — J45909 Unspecified asthma, uncomplicated: Secondary | ICD-10-CM

## 2019-12-09 DIAGNOSIS — M961 Postlaminectomy syndrome, not elsewhere classified: Secondary | ICD-10-CM

## 2019-12-09 DIAGNOSIS — I5042 Chronic combined systolic (congestive) and diastolic (congestive) heart failure: Secondary | ICD-10-CM

## 2019-12-09 DIAGNOSIS — I255 Ischemic cardiomyopathy: Secondary | ICD-10-CM

## 2019-12-09 DIAGNOSIS — J449 Chronic obstructive pulmonary disease, unspecified: Secondary | ICD-10-CM

## 2019-12-09 DIAGNOSIS — I10 Essential (primary) hypertension: Secondary | ICD-10-CM

## 2019-12-09 DIAGNOSIS — R739 Hyperglycemia, unspecified: Secondary | ICD-10-CM

## 2019-12-09 DIAGNOSIS — J302 Other seasonal allergic rhinitis: Secondary | ICD-10-CM

## 2019-12-09 DIAGNOSIS — I714 Abdominal aortic aneurysm, without rupture: Secondary | ICD-10-CM

## 2019-12-09 DIAGNOSIS — E782 Mixed hyperlipidemia: Secondary | ICD-10-CM

## 2019-12-09 DIAGNOSIS — R609 Edema, unspecified: Secondary | ICD-10-CM

## 2019-12-09 DIAGNOSIS — J189 Pneumonia, unspecified organism: Secondary | ICD-10-CM

## 2019-12-09 DIAGNOSIS — Z09 Encounter for follow-up examination after completed treatment for conditions other than malignant neoplasm: Secondary | ICD-10-CM

## 2019-12-09 DIAGNOSIS — K219 Gastro-esophageal reflux disease without esophagitis: Secondary | ICD-10-CM

## 2019-12-09 DIAGNOSIS — N183 CKD (chronic kidney disease) stage 3, GFR 30-59 ml/min (HCC): Secondary | ICD-10-CM

## 2019-12-09 DIAGNOSIS — R053 Chronic cough: Secondary | ICD-10-CM

## 2019-12-09 DIAGNOSIS — I429 Cardiomyopathy, unspecified: Secondary | ICD-10-CM

## 2019-12-09 DIAGNOSIS — M954 Acquired deformity of chest and rib: Secondary | ICD-10-CM

## 2019-12-09 DIAGNOSIS — L03116 Cellulitis of left lower limb: Secondary | ICD-10-CM

## 2019-12-09 DIAGNOSIS — I2510 Atherosclerotic heart disease of native coronary artery without angina pectoris: Secondary | ICD-10-CM

## 2019-12-09 DIAGNOSIS — J4541 Moderate persistent asthma with (acute) exacerbation: Secondary | ICD-10-CM

## 2019-12-09 DIAGNOSIS — Z8614 Personal history of Methicillin resistant Staphylococcus aureus infection: Secondary | ICD-10-CM

## 2019-12-09 DIAGNOSIS — I5043 Acute on chronic combined systolic (congestive) and diastolic (congestive) heart failure: Secondary | ICD-10-CM

## 2019-12-09 DIAGNOSIS — J479 Bronchiectasis, uncomplicated: Secondary | ICD-10-CM

## 2019-12-09 MED ORDER — SPIRONOLACTONE 25 MG PO TAB
25 mg | ORAL_TABLET | Freq: Two times a day (BID) | ORAL | 3 refills | 90.00000 days | Status: AC
Start: 2019-12-09 — End: ?

## 2019-12-09 MED ORDER — OZEMPIC 0.25 MG OR 0.5 MG(2 MG/1.5 ML) SC PNIJ
SUBCUTANEOUS | 3 refills | Status: AC
Start: 2019-12-09 — End: ?

## 2019-12-09 MED ORDER — AMITRIPTYLINE(#)/GABAPENTIN/EMU OIL 4/4/10%IN HRT TOPICAL CRM
Freq: Three times a day (TID) | 3 refills | 30.00000 days | Status: AC | PRN
Start: 2019-12-09 — End: ?

## 2019-12-09 MED ORDER — TOPIRAMATE 25 MG PO TAB
25 mg | ORAL_TABLET | Freq: Two times a day (BID) | ORAL | 3 refills | Status: AC
Start: 2019-12-09 — End: ?

## 2019-12-09 NOTE — Patient Instructions
Future Appointments   Date Time Provider Department Center   01/08/2020 12:00 AM [facility] Procedur   02/09/2020 12:00 AM [facility] Procedur   03/11/2020 12:00 AM [facility] Procedur   03/12/2020 11:20 AM Deveron Furlong, MD [facility] IM   04/12/2020 12:00 AM [facility] Procedur   05/13/2020 12:00 AM [facility] Procedur   05/19/2020 11:15 AM [name], Lenice Llamas, PA-C [facility]   06/14/2020 12:00 AM [facility] CVM Procedur       If you are on Mychart, you will now be able to read your clinic note from me.  My hope is that this will improve your engagement and insight into your health care and improve the accuracy of your medical record.  If you find inaccuracies, I apologize.  Please bring any concerns or inaccuracies to your next appointment.  Please remember that medical language and documentation is very different from how you and I speak and our dictation software is not perfect.    General Instructions:  ? How to reach me:   Please send a MyChart message to the General Medicine clinic or call Wynona Canes at [phone].    ? How to get a medication refill:  Please use the MyChart Refill request or contact your pharmacy directly to request medication refills. Please allow 48 hours.     ? How to receive your test results:  If you have signed up for MyChart, you will receive your test results and messages from me this way.  Otherwise, you will get a phone call or letter.   If you are expecting results and have not heard from my office within 2 weeks of your testing, please send a MyChart message or call my office.     ? Scheduling:  Our Scheduling phone number is [phone].  Same Day appointments are usually available. Please ask for an annual or yearly physical appointment if it has been over 1 year since our last appointment.  ? Appointment Reminders on your cell phone: Make sure we have your cell phone number, and Text [place] to [phone].  ? Support groups for many chronic illnesses are available through [facility]: [email or web address] or [phone].

## 2019-12-09 NOTE — Progress Notes
History of Present Illness  Ruben Bandstra. is a 79 y.o. male    He was admitted from 11/25/19 until 12/03/19 for acute on chronic systolic heart failure.     Discharge Summary:   Ruben Mckell. is a 79 y.o. 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 There have been several discussions about this patient?potentially?using immunoglobulin replacement therapy due to persistent hypogammaglobulinemia.   ?  He was seen in the HF clinic?on 11/20/19 by Dr. Vanetta Shawl with increased weight and concerns for left lower extremity cellulitis. ?He was started on Keflex 500 mg every 6 hours. ?He followed up in heart failure clinic on?11/25/19 with concern for?worsening?hypervolemia?and cellulitis and hospital admission was recommended.?  ?  He was admitted 11/25/2019 and followed by the Advanced Heart Failure Rounding Service for acute on chronic HFrEF.  He was diuresed with IV Bumex and CardioMEMS PAD readings were monitored daily.  There was concerned that CardioMEMS PAD readings may not be accurate therefore on 12/02/19 he underwent RHC and the CardioMEMS did not need recalibrating.  His new CardioMEMS target is 15 with thresholds 12-17.  Discussed this with Tish Men CardioMEMS coordinator his PTA spironolactone and SGLT2 inhibitor were continued, it was discussed with him that he would benefit from up-titration of his Jardiance to 25 mg in outpatient setting, plan to start this on 10/14. ?  He was followed by infectious disease for bilateral lower extremity cellulitis L>R treated with IV antibiotic therapy (vancomycin and ceftriaxone).  On day of discharge his white blood cell count was 7.1, T-max was 98.8.  He is scheduled for follow-up with Dr. Adella Hare on 10/18 at 10: 15.  ?  He also underwent lower extremity venous Doppler which was negative for DVT.  Lower extremity arterial Doppler was abnormal and vascular physician was consulted for further recommendations. Vascular team recommended bilateral venous insufficiency study which did not reveal residual reflux. Therefore vascular team recommend patient wear support socks as tolerated but did not feel he needed any further testing/interventions.   ?  He was followed by pulmonology and treated for acute asthma exacerbation with steroids taper, inhalers, nebulizer treatments, Singulair, Aerobika. Hia  He will discharged with prednisone 20 mg daily until 10/17 then reduce to 10 mg daily for 5 days.  He has oxygen concentrator for the trip home.  He was given a flu vaccination on day of discharge and was recommended to get his third Covid vaccination 4 weeks from then..  ?  Iron panel 10/5: Total iron 36, 11% saturation, TIBC 323, ferritin 147. ?We will consider IV iron as outpatient after infection is cleared.?  ?  ?  He will be seen in post hospital follow up on 10/18 at 1130 by Herby Abraham, HF, NP repeat BMP will be performed.    Today, reports that he is feeling better overall.  He has not had any reaccumulation of fluid in his lower extremities.  His breathing is much more comfortable.  He has not had any chest pain or pressure heaviness.  He denies any chest wheezing or tightness.    We spent the majority of our time today  talking about his weight.  Over the past 6 months or more, he has gained over 30 pounds.  The past several months of been difficult for him as he had a COVID-19 infection that was quite severe and he had a very prolonged recovery.  He has been very and active.  Then with his lung issues, he has had several rounds of prednisone which case this often increases his appetite and eats more than he usually does.  Between the dietary increase in calories and the lack of activity, this has been a bad combination for him in terms of weight gain.  He is interested in medication options.  He is very motivated to lose weight.    They asked about the COVID-19 booster.  He had the [facility] series.        Review of Systems  Per HPI    Objective:         ? acetaminophen (TYLENOL) 325 mg tablet Take two tablets by mouth every 6 hours as needed for Pain.   ? albuterol 0.083% (PROVENTIL) 2.5 mg /3 mL (0.083 %) nebulizer solution Inhale 3 mL solution by nebulizer as directed every 6 hours as needed for Wheezing or Shortness of Breath. Indications: asthma (Patient taking differently: Inhale 3 mL solution by nebulizer as directed three times daily as needed for Wheezing or Shortness of Breath. Indications: asthma)   ? albuterol sulfate (PROAIR HFA) 90 mcg/actuation aerosol inhaler Inhale two puffs by mouth into the lungs every 4 hours as needed for Wheezing or Shortness of Breath.   ? allopurinoL (ZYLOPRIM) 300 mg tablet Take 300 mg by mouth daily. Take with food.   ? amitriptyline/gabapentin/emu oil(#) (AMITRIPTYLINE/GABAPENTIN/EMU OIL(#)) 05/24/08 % Apply to affected area TID PRN   ? ASCORBIC ACID-ASCORBATE SODIUM PO Take 500 mg by mouth every morning.   ? aspirin EC 81 mg tablet Take one tablet by mouth daily. Take with food.   ? atorvastatin (LIPITOR) 40 mg tablet TAKE (1) TABLET BY MOUTH ONCE DAILY   ? benzonatate (TESSALON PERLES) 100 mg capsule Take 100 mg by mouth every 8 hours.   ? budesonide-formoterol (SYMBICORT HFA) 160-4.5 mcg/actuation inhalation Inhale two puffs by mouth into the lungs twice daily. Rinse mouth after use.   ? bumetanide (BUMEX) 2 mg tablet Take one tablet by mouth twice daily.   ? cholecalciferol (VITAMIN D-3) 1,000 units tablet Take 1,000 Units by mouth daily.   ? duloxetine DR (CYMBALTA) 60 mg capsule Take one capsule by mouth daily.   ? empagliflozin (JARDIANCE) 25 mg tablet Take one tablet by mouth daily.   ? finasteride (PROSCAR) 5 mg tablet Take one tablet by mouth daily.   ? fish oil- omega 3-DHA/EPA 300/1,000 mg capsule Take 1 capsule by mouth daily.   ? ipratropium (ATROVENT) 0.03 % nasal spray Apply two sprays to each nostril as directed every 12 hours.   ? Miscellaneous Medical Supply misc Rx: Please wrap legs with ACE bandage from toes as high up to the leg as possible bilaterally  Dx: Lymphedema   ? montelukast (SINGULAIR) 10 mg tablet Take one tablet by mouth at bedtime daily.   ? nitroglycerin (NITROSTAT) 0.4 mg tablet Place 0.4 mg under tongue every 5 minutes as needed for Chest Pain. Max of 3 tablets, call 911.   ? pantoprazole DR (PROTONIX) 40 mg tablet TAKE (1) TABLET BY MOUTH TWO TIMES A DAY.   ? predniSONE (DELTASONE) 10 mg tablet Take two tablets by mouth daily for 3 days, THEN  one tablet daily for 5 days.   ? semaglutide (OZEMPIC) 0.25 mg or 0.5 mg(2 mg/1.5 mL) injection PEN Inject one-quarter mg under the skin every 7 days for 28 days, THEN one-half mg every 7 days for 62 days.   ? sodium chloride 3 % nebulizer solution Inhale 4 mL by mouth into the lungs twice daily. Use with [place].  Indications: asthma, chronic cough, bronchiectasis (Patient taking differently: Inhale 4 mL by mouth into the lungs three times daily. Use with [place].  Indications: asthma, chronic cough, bronchiectasis)   ? spironolactone (ALDACTONE) 25 mg tablet Take one tablet by mouth twice daily. Take with food.   ? tamsulosin (FLOMAX) 0.4 mg capsule Take one capsule by mouth daily.   ? topiramate (TOPAMAX) 25 mg tablet Take one tablet by mouth every 12 hours.   ? traMADoL (ULTRAM) 50 mg tablet Take one tablet by mouth every 12 hours as needed for Pain.   ? zinc sulfate 220 mg (50 mg elemental zinc) capsule Take 220 mg by mouth daily. Vitals:    12/09/19 1239   BP: 120/75   Pulse: 86   SpO2: 93%   Weight: (!) 137.4 kg (303 lb)   Height: 180.3 cm (70.98)   PainSc: Three       Body mass index is 42.28 kg/m?[name]     Wt Readings from Last 20 Encounters:   12/09/19 (!) 137.4 kg (303 lb)   12/09/19 (!) 137.4 kg (303 lb)   12/08/19 (!) 137.4 kg (303 lb)   12/03/19 (!) 137.6 kg (303 lb 6.4 oz)   11/25/19 (!) 138 kg (304 lb 3.2 oz)   11/20/19 (!) 137 kg (302 lb)   11/06/19 133.8 kg (295 lb)   10/21/19 132 kg (291 lb)   09/12/19 133.8 kg (295 lb)   09/02/19 133.8 kg (295 lb)   05/21/19 123.8 kg (273 lb)   05/16/19 123.8 kg (273 lb)   05/16/19 123.8 kg (273 lb)   03/14/19 127 kg (280 lb)   03/03/19 126.1 kg (278 lb)   01/31/19 132.9 kg (293 lb)   01/15/19 131.5 kg (290 lb)   01/03/19 129.3 kg (285 lb)   12/31/18 129.3 kg (285 lb)   12/21/18 127.5 kg (281 lb)         Physical Exam  Vitals and nursing note reviewed.   Constitutional:       General: He is not in acute distress.     Appearance: He is well-developed. He is not diaphoretic.   HENT:      Head: Normocephalic and atraumatic.   Neck:      Thyroid: No thyromegaly.      Vascular: No JVD.   Cardiovascular:      Rate and Rhythm: Normal rate and regular rhythm.      Heart sounds: Normal heart sounds. No murmur heard.   No friction rub. No gallop.    Pulmonary:      Effort: Pulmonary effort is normal. No respiratory distress.      Breath sounds: Normal breath sounds. No wheezing or rales.   Neurological:      Mental Status: He is alert.         Labwork reviewed:  Lab Results   Component Value Date/Time    HGBA1C 5.9 11/25/2019 02:45 PM    HGBA1C 5.9 09/02/2019 09:50 AM    HGBA1C 6.2 (A) 04/28/2019 12:00 AM    HGBPOC 11.2 (L) 10/25/2012 10:58 AM    HGBPOC 12.9 (L) 10/25/2012  08:55 AM    MCALBR 7.6 04/29/2019 12:00 AM    TSH 1.79 11/25/2019 02:45 PM    FREET4R 0.99 12/15/2014 09:12 AM    CHOL 106 12/05/2018 01:57 AM    TRIG 108 12/05/2018 01:57 AM    HDL 31 (L) 12/05/2018 01:57 AM    LDL 60 12/05/2018 01:57 AM    NA 142 12/03/2019 04:51 AM    K 4.3 12/03/2019 04:51 AM    CL 100 12/03/2019 04:51 AM    CO2 34 (H) 12/03/2019 04:51 AM    GAP 8 12/03/2019 04:51 AM    BUN 33 (H) 12/03/2019 04:51 AM    CR 1.4 (H) 12/09/2019 11:51 AM    CR 1.28 (H) 12/03/2019 04:51 AM    GLU 129 (H) 12/03/2019 04:51 AM    CA 8.5 12/03/2019 04:51 AM    PO4 3.2 12/21/2018 10:34 PM    ALBUMIN 3.7 11/25/2019 02:45 PM    TOTPROT 6.6 11/25/2019 02:45 PM    ALKPHOS 67 11/25/2019 02:45 PM    AST 19 11/25/2019 02:45 PM    ALT 16 11/25/2019 02:45 PM    TOTBILI 0.9 11/25/2019 02:45 PM    GFR 54 (L) 12/03/2019 04:51 AM    GFRAA >60 12/03/2019 04:51 AM    PSA 2.96 11/25/2018 12:00 AM              Assessment and Plan:    1. Hospital discharge follow-up    2. Morbid obesity (HCC)    3. Chronic combined systolic and diastolic congestive heart failure, NYHA class 2 (HCC)    4. Ischemic cardiomyopathy    5. Moderate persistent asthma with acute exacerbation    6. Hyperglycemia    7. Peripheral polyneuropathy      I reviewed the discharge summary as well as relevant imaging and laboratory data from that hospital stay.  I also reviewed the transitions of care phone call.    Majority of our discussion was focused around his obesity.  He has gained a significant amount of weight over the last several months as the COVID-19 infection led to significant reduction in his activity level.  He is also had increased appetite with the multiple rounds of prednisone that he has had for his asthma and bronchiectasis flares.  We talked about our strategies going forward.  I am going to start him on semaglutide 0.25 mg q. weekly x4 weeks and then increase that to 0.5 mg q. weekly thereafter.  Also start him on topiramate 25 mg twice daily.  This will be both for weight loss as well as to help with his neuropathy pain.  I would like him to be seen in about 4 weeks for reevaluation on his weight.    As for the neuropathy, I am going to start him on topiramate as mentioned above.  We are also stopping gabapentin as this can cause fluid retention.  He can continue with duloxetine.    From a heart failure standpoint, he appears well compensated today.  He will continue to follow closely with the heart failure clinic.  He is on goal-directed medical therapy, so no changes were made to his medicines today.    Total of 40 minutes were spent on the same day of the visit including preparing to see the patient, obtaining and/or reviewing separately obtained history, performing a medically appropriate examination and/or evaluation, counseling and educating the patient/family/caregiver, ordering medications, tests, or procedures, referring and communication with other health care professionals, documenting clinical  information in the electronic or other health record, independently interpreting results and communicating results to the patient/family/caregiver, and care coordination.         Patient Instructions     Future Appointments   Date Time Provider Department Center   01/08/2020 12:00 AM [facility] CVM Procedur   02/09/2020 12:00 AM [facility] CVM Procedur   03/11/2020 12:00 AM [facility] CVM Procedur   03/12/2020 11:20 AM Deveron Furlong, MD [facility] IM   04/12/2020 12:00 AM [facility]OTE [facility]IT[facility] CVM Procedur   05/13/2020 12:00 AM [facility]OTE [facility]IT[facility]OTEHRM CVM Procedur   05/19/2020 11:15 AM [name], Lenice Llamas, PA-C [facility]   06/14/2020 12:00 AM [facility]OTE [facility]ITORING [facility]CREMOTEHRM CVM Procedur       If you are on Mychart, you will now be able to read your clinic note from me.  My hope is that this will improve your engagement and insight into your health care and improve the accuracy of your medical record.  If you find inaccuracies, I apologize.  Please bring any concerns or inaccuracies to your next appointment.  Please remember that medical language and documentation is very different from how you and I speak and our dictation software is not perfect.    General Instructions:  ? How to reach me:   Please send a MyChart message to the General Medicine clinic or call Wynona Canes at [phone].    ? How to get a medication refill:  Please use the MyChart Refill request or contact your pharmacy directly to request medication refills. Please allow 48 hours.     ? How to receive your test results:  If you have signed up for MyChart, you will receive your test results and messages from me this way.  Otherwise, you will get a phone call or letter.   If you are expecting results and have not heard from my office within 2 weeks of your testing, please send a MyChart message or call my office.     ? Scheduling:  Our Scheduling phone number is [phone].  Same Day appointments are usually available. Please ask for an annual or yearly physical appointment if it has been over 1 year since our last appointment.  ? Appointment Reminders on your cell phone: Make sure we have your cell phone number, and Text [place] to [phone].  ? Support groups for many chronic illnesses are available through [facility]: [email or web address] or [phone].             Return in about 4 weeks (around 01/06/2020).

## 2019-12-09 NOTE — Progress Notes
Date of Service: 12/09/2019    Ruben Wood. Ruben Wood is a 79 y.o. male.       HPI     I had the pleasure of seeing Ruben Wood, who is accompanied by his wife Ruben Wood today in [place] cardiovascular medicine clinic for post hospital follow-up.  He was hospitalized at [facility] from October 5 through October 13 for acute on chronic heart failure with combined systolic and diastolic dysfunction.  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  He had been seen in heart failure clinic on September 30 by Dr. Sherryll Burger, he had increased weight and lower extremity cellulitis.  He had follow-up appointment on 10/5 with worsening hypervolemia and cellulitis and was admitted to the hospital.  He was diuresed with IV Bumex and his CardioMEMS readings were monitored daily.  There was concerned that the CardioMEMS reading may not be accurate therefore he underwent a right heart cath on December 02, 2019 that did not need any recalibration as the PA diastolic was congruent with the right heart cath PA diastolic.  His new CardioMEMS target is 15 with thresholds 12-17.  His Jardiance was uptitrated to 25 mg.  He was seen by infectious disease for bilateral lower extremity cellulitis and was treated with IV vancomycin and ceftriaxone, on day of discharge his white blood cell count was 7.1 with T-max 98.8.  He was scheduled for a follow-up appointment with Dr. Adella Hare today.  He underwent lower extremity venous Doppler that was negative for DVT, lower extremity arterial Doppler was abnormal, vascular physician was consulted for further recommendation.  They recommended patient wear support stockings as tolerated.  Pulmonary was consulted for acute asthma exacerbation, he was started on a steroid taper, inhaler, nebulizer treatments, Singulair and aerobika.  He was discharged on prednisone 20 mg daily until 10/17 then reduce to 10 mg daily for 5 days.  He was given a flu vaccination on day of discharge and was recommended to get his third Covid vaccination 4 weeks from then.  He did have an iron panel on 10/5 that showed iron deficiency however with recent treatment of cellulitis deferred IV iron to an outpatient clinic assessment.    He was scheduled to see me yesterday however missed the appointment when he came back to see Dr. Harley Alto.  At the visit yesterday with Dr. Adella Hare, there were no medication changes made.  He had referred him to Dr. Crecencio Mc to talk about [place] weight loss program.  He had concerns for monophasic waveform on the left lower extremity arterial duplex and since he had a good posterior tibial pulse in the leg but no symptoms planned to have conservative management.    Today he reports that his breathing is markedly improved since he was diuresed, we discussed that he had diuresed 15 L.  He reports he has more energy and is able to do more.  He is pleased that his cellulitis on his hand has improved.  His CardioMEMS PA diastolic today is 13, within range.  He has occasional episodes of lightheadedness, his wife attributes this to changes in blood glucose levels.  They do attribute the weight gain to eating out more frequently since being [place] the  road.  He had been taking spironolactone 25 mg twice daily instead of daily, however he is taking Bumex dose as prescribed.  He does do home vest therapy and inhaled saline treatments.    He had a post hospital follow-up phone call on 10/14 by Ruben Flemings, RN       Vitals:    12/09/19 1128   BP: 114/66   BP Source: Arm, Left Upper   Patient Position: Sitting   Pulse: 92   SpO2: (!) 91%   Weight: (!) 137.4 kg (303 lb) Height: 1.803 m (5' 11)   PainSc: Zero     Body mass index is 42.26 kg/m?Ruben Wood     Past Medical History  Patient Active Problem List    Diagnosis Date Noted   ? Complex care coordination 05/04/2017     Priority: High     Class: Acute   ? 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 Acute on chronic combined systolic (congestive) and diastolic (congestive) heart failure (HCC) 11/25/2019   ? Chronic bronchitis (HCC) 11/25/2019   ? Pneumonia due to COVID-19 virus 12/05/2018   ? Chronic bronchitis with acute exacerbation (HCC) 12/05/2018   ? COVID-19 virus infection 12/05/2018   ? 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 Type 2 diabetes mellitus with hyperglycemia, without long-term current use of insulin (HCC) 03/12/2018   ? Atrial fibrillation (HCC) 03/12/2018   ? Morbid obesity (HCC) 11/16/2017   ? Chronic respiratory failure with hypercapnia (HCC) 11/16/2017   ? Depression 11/15/2017   ? OAB (overactive bladder)      Urinary frequency, urgency, urge urinary incontinence (UUI), nocturia, nocturnal enuresis.  -- trial Mirabegron 25 mg --> improved, but persistent sx's.  -- trial Mirabegron 50 mg.     ? Urinary incontinence, urge      See OAB A&P note.     ? Coronary artery disease due to lipid rich plaque 06/29/2017   ? Bronchiectasis with acute lower respiratory infection (HCC) 05/10/2017   ? 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     ? 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  D/c'd Tamsulosin d/t lack of symptom improvement.     ? Enrolled in chronic care management 02/28/2017   ? History of elevated prostate specific antigen (PSA)      PNBx (03/01/2017): (L) high-grade prostatic intraepithelial neoplasia (HG PIN), 1/6 cores; [name], PA-C.     ? Spondylolisthesis, lumbar region 12/13/2016   ? Lumbar post-laminectomy syndrome      L4-5     ? Spinal stenosis of lumbosacral region 09/20/2016   ? Spondylolisthesis of lumbosacral region 09/20/2016   ? Osteoarthritis of spine with radiculopathy, lumbosacral region 09/20/2016   ? 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 He had a normal response to pneumococca vaccination; he had 17/23 positive serotypes. He has normal tetanus toxoid Ab and CH50.  His T&B cell panel revealed a mildly low B-cell count at 68 right after a time of acute illness.  He had positive diphtheria antibody.    Likely multifactorial with significant comorbidities including frequent steroid use for bronchiectasis exacerbations, frequent infections,  and possibly a nutritional component (low total protein).    Currently on doxycycline once daily for recurrent infections, which he feels has been tremendously helpful.     Plan:  - Continue doxycycline 100 mg daily  - Recommend getting the pneumovax immunization next time he is in clinic in person and will need repeat pneumococcal antibody levels 1 month afterwards once he has had resolution of his COVID issues.          ? Moderate persistent asthma with acute exacerbation 02/25/2016     He has had asthma since sometime in his 20-30s. He gets cough, chest tightness, wheezing, shortness of breath that is year round with worsening in the Spring and Fall.   Triggers: URI, hot/cold air. Additionally he has been diagnosed with bronchiectasis and emphysema; his PFTs suggest both obstructive and restrictive disease and asthma is unlikely to be the sole cause of his dyspnea.    He has negative IgE immunocaps to aeroallergens which makes the possibility of allergic asthma highly unlikely. His IgE level was 17 and he had negative ANCAs, MPO and PR3.     Repeatedly being treated with steroids now for flares.  Also on hypertonic saline 3 times a day although not currently using them), DuoNebs, Symbicort, and Singulair as well as aggressive pulmonary clearance with vest and Aerobika.        ? 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 He is not having issues with urticaria.    - We recommend IgE immunocaps to aeroallergens.      ? Recurrent infections 02/25/2016     Recurrent sinopulmonary infections requiring antibiotics 5-10 times per year.   Cellulitis and non-healing wounds.   Many underlying illnesses predisposing him to these infections in addition to intermittent hypogammaglobulinemia associated with active infections and systemic steroids.      ? S/P left pulmonary artery pressure sensor implant placement (CardioMEMs)  02/02/2016     * Patient has CardioMEMs (Pulmonary Artery Pressure Sensor)* Please call Matthew Folks, CardioMEMs Program Coordinator [phone] or page Heart Failure Rounding Team if patient is admitted or presents to ED*  03/30/15 implant     ? Ischemic cardiomyopathy 01/12/2016   ? 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 ICD (implantable cardioverter-defibrillator), biventricular, in situ 12/07/2015   ? Hypercholesterolemia 11/25/2015   ? 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 Bronchiectasis without complication (HCC) 06/18/2015     Non-sputum producer.  Currently on Symbicort.  - Dr. [name] was able to get him a vest to wear at home for secretions and this has helped immensely with his secretions and breathing.      ? Cellulitis of left lower extremity 11/12/2014     10/2014: admitted with cellulitis, [place] negative for venous clot, MRI without osteomyelitis.      11/12/2014 In clinic today, still with cellulitis.  Per patient and his wife, the erythema may be extending.  Cultures from drainage in hospital with MSSA, but Blood Cx negative.  No e/o osteomyelitis.  My concern is that this antibiotic regimen is not adequately treating his cellulitis.  We  will broaden coverage to get MRSA with doxycycline.  Patient and wife given strict call/return criteria.  I will see patient in 3-4 days for re-evaluation.  No fever today.   Plan:  Stop cefpodoxime and start doxycylcine    11/17/2014 Much better, no warmth and erythema minimal.    Plan: continue doxycyline for full 10 day course.        ? Osteoarthritis of spine with radiculopathy, lumbar region 07/28/2014   ? Chronic combined systolic and diastolic congestive heart failure, NYHA class 2 (HCC) 07/19/2014     11/04/14: EF 20%, severely dilated LV with grade 1 diastolic dysfunction.  11/12/2014 In clinic today, patient appears fairly well compensated.  He does have some LEE, but no significant rales (just some at bases), no JVD sitting upright, and no sx to suggest decompensation.  We will continue metoprolol, spironolactone, and bumex at current doses.    11/17/2014 Edema much improved in lower ext, Cr trending down on last labs.  Will recheck labs today.  Advised to weigh himself daily.  Rechecking BMP today to ensure not overdoing diuretics.     On metoprolol, spironolactone.  Will need to explore allergy to ARB more as patient may benefit from ACEI.      03/30/15 CardioMEMS implant by Dr. Chales Abrahams  1. Normal cardiac output and cardiac index.  2. Normal pulmonary pressures and normal pulmonary capillary wedge pressure.  3. Successful insertion of CardioMEMS pulmonary artery pressure sensor     ? 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      ? Mixed dyslipidemia 08/26/2013     Hepatic Function    Lab Results   Component Value Date/Time    ALBUMIN 4.0 11/04/2014 12:26 AM    TOTAL PROTEIN 6.9 11/04/2014 12:26 AM    ALK PHOSPHATASE 61 11/04/2014 12:26 AM    Lab Results   Component Value Date/Time    AST (SGOT) 41* 11/04/2014 12:26 AM    ALT (SGPT) 19 11/04/2014 12:26 AM    TOTAL BILIRUBIN 1.4* 11/04/2014 12:26 AM        Lab Results   Component Value Date    CHOL 148 12/15/2013    TRIG 122 12/15/2013    HDL 51 12/15/2013    LDL 88 12/15/2013    VLDL 24 12/15/2013    NONHDLCHOL 97 12/15/2013       BP Readings from Last 3 Encounters:   11/12/14 129/80   11/09/14 111/44   11/04/14 146/65     Plan:   79 y.o. with known CAD with CTO of RCA and obtuse marginal branch with high grade stenosis (90%) in mid left circ.    Advised heart healthy diet and daily exercise.    Continue high intensity statin, atorvastatin       ? AAA (abdominal aortic aneurysm) (HCC) 10/15/2012     Duplex done at OSH in 2007 and 2008 ~ 4.1 cm    Duplex 10/15/12-AAA 7.3 cm AP x 7.3 cm       ? History of MRSA infection 10/14/2012   ? CAD (coronary artery disease), native coronary artery 08/15/2012  07/11/2002- Cath @ [facility] showed Severe double vessel disease(OMB of circumflex and distal circ)  inferior-basilar dyskinesis.                  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    09/12/12   Cath - [facility]:  CTO of the right coronary artery, CTO of the obtuse marginal branch and high-grade stenosis of   90% in the mid left circumflex artery No significant stenosis in the left anterior descending artery or left main vessel     Failed attempt in opening 2nd obtuse marginal CTO as described above.    10/14/12: Unsuccessful attempt to open CTO of OMB and mid-CFX by Dr. Mackey Birchwood      04/02/17: Cath by Dr. Steward Ros:   4. Chronic total occlusion of the circumflex.  5. Chronic total occlusion of the right coronary artery, status post percutaneous coronary intervention with drug-eluting stent times 2.  6. Small distal right coronary artery perforation without echo or hemodynamic evidence of compromise.    06/29/17-cardiac catheterization by Dr. Steward Ros. One DES placed to chronic total occlusion of the circumflex artery. The RCA stent was patent.     ? OSA on CPAP 08/13/2012     CPAP at  DME: [facility]    Split night:  08/14/2017  AHI 43.2  REM n/a  Time below 88 %: 98 mins  Lowest sat: 83%     CPAP 10 cm h20 effective      ? COPD (chronic obstructive pulmonary disease) (HCC) 08/13/2012     04/28/13: PFTs with more restrictive pattern with FEV1 50%, FEV1/FVC 89%, DLCO 65%.    12/27/13: PFTs with FEV1 82%, FEV1/FVC 113%, and DLCO 77%     ? 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  Discussed the importance of diet.  Exercise as tolerated.        ? Essential hypertension 08/13/2012   ? Chest wall deformity 07/09/2012     Chest wall reconstruction on 07/09/12  Lung herniation- bio bridge           Review of Systems   Constitutional: Positive for malaise/fatigue.   HENT: Negative.    Eyes: Negative.    Cardiovascular: Negative.    Respiratory: Negative.    Endocrine: Negative.    Hematologic/Lymphatic: Negative.    Skin: Negative.    Musculoskeletal: Negative.    Gastrointestinal: Negative.    Genitourinary: Negative.    Neurological: Negative.    Psychiatric/Behavioral: Negative.    Allergic/Immunologic: Negative.        Physical Exam  General Appearance: In NAD, obese  Neck Veins: normal JVP, neck veins are not distended; no HJR   Chest Inspection: chest is normal in appearance   Respiratory Effort: breathing comfortably, no respiratory distress   Auscultation/Percussion: lungs clear to auscultation, no rales, rhonchi or wheezing   Cardiac Rhythm: regular rhythm and normal rate   Cardiac Auscultation: S1, S2 normal, no rub, no definite S3  or S4   Murmurs: no murmur   Peripheral Circulation: normal peripheral circulation  Pedal Pulses: normal symmetric pedal pulses   Lower Extremity Edema: Trace lower extremity edema, venous discoloration  Abdominal Exam: soft, non-tender, no obvious masses, bowel sounds normal   Gait & Station: Seated in a wheelchair  Orientation: oriented to person, place and time   Affect & Mood: appropriate and sustained affect   Language and Memory: patient responsive and seems to comprehend information   Neurologic Exam: neurological assessment grossly intact   Vital signs were reviewed    Cardiovascular Studies    Echocardiogram?10/6: LV severely dilated, severe predominantly basal septal hypertrophy, LV EF 35%, unable to assess diastolic function.  RV moderately dilated with normal wall thickness and systolic function.  IVS 1.79 cm.  LA moderately dilated, RA mildly dilated.  CVP 0-5 mmHg.  MVR moderate?severe, eccentric regurgitation, mean gradient 3-4 mmHg.  Mild TVR.  PASP 37 mmHg.  Cardiovascular Health Factors  Vitals BP Readings from Last 3 Encounters:   12/09/19 114/66   12/08/19 (!) 142/68   12/03/19 135/49     Wt Readings from Last 3 Encounters:   12/09/19 (!) 137.4 kg (303 lb)   12/08/19 (!) 137.4 kg (303 lb)   12/03/19 (!) 137.6 kg (303 lb 6.4 oz)     BMI Readings from Last 3 Encounters:   12/09/19 42.26 kg/m?   12/08/19 42.26 kg/m?   12/03/19 42.32 kg/m?      Smoking Social History     Tobacco Use   Smoking Status Former Smoker   ? Packs/day: 2.00   ? Years: 40.00   ? Pack years: 80.00   ? Types: Cigarettes   ? Quit date: 07/09/1998   ? Years since quitting: 21.4   Smokeless Tobacco Never Used      Lipid Profile Cholesterol   Date Value Ref Range Status   12/05/2018 106 <200 MG/DL Final     HDL   Date Value Ref Range Status   12/05/2018 31 (L) >40 MG/DL Final     LDL   Date Value Ref Range Status   12/05/2018 60 <100 mg/dL Final     Triglycerides   Date Value Ref Range Status   12/05/2018 108 <150 MG/DL Final      Blood Sugar Hemoglobin A1C   Date Value Ref Range Status   11/25/2019 5.9 4.0 - 6.0 % Final     Comment:     The ADA recommends that most patients with type 1 and type 2 diabetes maintain   an A1c level <7%.       Glucose   Date Value Ref Range Status   12/03/2019 129 (H) 70 - 100 MG/DL Final   16/11/9602 540 (H) 70 - 100 MG/DL Final   98/12/9145 829 (H) 70 - 100 MG/DL Final     Glucose Fasting   Date Value Ref Range Status   12/15/2014 111  Final     Glucose, POC   Date Value Ref Range Status   12/25/2018 202 (H) 70 - 100 MG/DL Final   56/21/3086 578 (H) 70 - 100 MG/DL Final   46/96/2952 841 (H) 70 - 100 MG/DL Final          Problems Addressed Today  Encounter Diagnoses   Name Primary?   ? Acute on chronic combined systolic (congestive) and diastolic (congestive) heart failure (HCC) Yes       Assessment and Plan     ?  Chronic?systolic and diastolic?HFrEF, ?EF: 35%.  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 ?RV moderately dilated with normal wall thickness and systolic function. ?IVS 1.79 cm. ?LA moderately dilated, RA mildly dilated. ?CVP 0-5 mmHg. ?MVR moderate?severe, eccentric regurgitation, mean gradient 3-4 mmHg. ?Mild TVR. ?PASP 37 mmHg.  Major Complications or Comorbidities (MCC):?acute/ acute on chronic systolic and/or diastolic heart failure  NYHA functional class?III  -He usually follows with Dr. Sherryll Burger  -He has been taking Bumex 2 mg p.o. twice daily, he appears euvolemic on physical exam.  He has CardioMEMS in situ, and the PA diastolic is 13 today, his range has been 12-17..  ?  CKD, stage III  - Baseline creatinine 1.3?1.7  On day of discharge the creatinine was 1.28, point of care labs today show creatinine 1.4.  ?  CRT-D in situ  - Placed 11/25/2015 by Dr. Betti Cruz  - 10/5 Device interrogation shows BiV pacing 92%, AP 13%. ?Most recent AMS 9/23, 10 seconds, AT, looks similar to 2-1 atrial flutter. ?Overall burden less than 1%. ?  ?  Paroxysmal AFib/Flutter  - pt currently not on a/c  - recent device interrogation shows ~10 seconds of AT w/ HR < 180 bpm; rhythm similar to 2:! Aflutter  - CHADs2VASc score 5 (HF,HTN, age, DM2)  > will defer starting a/c at this time  ?  CAD  -?LHC 06/29/17: DES to the CTO left circumflex obtuse marginal?2 by Dr. Alden Server. ?Patent stent in proximal RCA, 30% distal left main stenosis with circumferential calcium noted on the OCT-?Prior left cardiac catheterization 04/02/17 with DES x2 to the RCA w/ small distal?RCA perforation, trace?pericardial effusion;?known total occlusion of the CX.??Left main and LAD have 30-40%?dz.?  >?Continue ASA 81 mg, atorvastatin 40 mg daily.  ?  PAD  - 10/7 ABI ?+ doppler BLE shows normal ABI to BLE  - 10/7 RLE arterial duplex shows 50-75% stenosis to mid R SFA with elevated velocity, turbulence and ratio  - 10/7 LLE arterial duplex shows 50-75% stenosis to L EIA, 50-75% stenosis to distal L CFA and proximal L SFA w/ elevated velocity and ratio along w/ monophasic waveform; focal stenosis unable to be ruled out  - 10/8 PV venous insufficiency BLE study shows no residual reflux   - pt established with Dr Elizabeth Palau; plan for conservative treatment with Jonne Ply and statin   ?  Chronic bronchitis/COPD?/bronchiectasis  Asthma exacerbation   Prior COVID-19 infection with hospitalization  Oxygen dependent   - Pt follows with Dr. Lambert Mody from Pulmonary team, scheduled to see him next on 1/21  - 10/6 Per Pulmonary-continue prednisone 10 mg daily for 3 more days and continue home vest therapy as well as inhalers as prescribed.  ?  Morbid obesity:-?Body mass index is 42.43 kg/m?, they will be speaking to Dr. Crecencio Mc about  weight loss program.  ?  OSA:> CPAP nightly           Current Medications (including today's revisions)  ? acetaminophen (TYLENOL) 325 mg tablet Take two tablets by mouth every 6 hours as needed for Pain.   ? albuterol 0.083% (PROVENTIL) 2.5 mg /3 mL (0.083 %) nebulizer solution Inhale 3 mL solution by nebulizer as directed every 6 hours as needed for Wheezing or Shortness of Breath. Indications: asthma (Patient taking differently: Inhale 3 mL solution by nebulizer as directed three times daily as needed for Wheezing or Shortness of Breath. Indications: asthma)   ? albuterol sulfate (PROAIR HFA) 90 mcg/actuation aerosol inhaler Inhale two puffs by mouth into the lungs every  4 hours as needed for Wheezing or Shortness of Breath.   ? allopurinoL (ZYLOPRIM) 300 mg tablet Take 300 mg by mouth daily. Take with food.   ? amitriptyline/gabapentin/emu oil(#) (AMITRIPTYLINE/GABAPENTIN/EMU OIL(#)) 05/24/08 % Apply  topically to affected area twice daily as needed.   ? ASCORBIC ACID-ASCORBATE SODIUM PO Take 500 mg by mouth every morning.   ? aspirin EC 81 mg tablet Take one tablet by mouth daily. Take with food.   ? atorvastatin (LIPITOR) 40 mg tablet TAKE (1) TABLET BY MOUTH ONCE DAILY   ? benzonatate (TESSALON PERLES) 100 mg capsule Take 100 mg by mouth every 8 hours.   ? budesonide-formoterol (SYMBICORT HFA) 160-4.5 mcg/actuation inhalation Inhale two puffs by mouth into the lungs twice daily. Rinse mouth after use.   ? bumetanide (BUMEX) 2 mg tablet Take one tablet by mouth twice daily.   ? cholecalciferol (VITAMIN D-3) 1,000 units tablet Take 1,000 Units by mouth daily.   ? duloxetine DR (CYMBALTA) 60 mg capsule Take one capsule by mouth daily.   ? empagliflozin (JARDIANCE) 25 mg tablet Take one tablet by mouth daily.   ? finasteride (PROSCAR) 5 mg tablet Take one tablet by mouth daily.   ? fish oil- omega 3-DHA/EPA 300/1,000 mg capsule Take 1 capsule by mouth daily.   ? ipratropium (ATROVENT) 0.03 % nasal spray Apply two sprays to each nostril as directed every 12 hours.   ? Miscellaneous Medical Supply misc Rx: Please wrap legs with ACE bandage from toes as high up to the leg as possible bilaterally  Dx: Lymphedema   ? montelukast (SINGULAIR) 10 mg tablet Take one tablet by mouth at bedtime daily.   ? nitroglycerin (NITROSTAT) 0.4 mg tablet Place 0.4 mg under tongue every 5 minutes as needed for Chest Pain. Max of 3 tablets, call 911.   ? pantoprazole DR (PROTONIX) 40 mg tablet TAKE (1) TABLET BY MOUTH TWO TIMES A DAY.   ? predniSONE (DELTASONE) 10 mg tablet Take two tablets by mouth daily for 3 days, THEN one tablet daily for 5 days.   ? sodium chloride 3 % nebulizer solution Inhale 4 mL by mouth into the lungs twice daily. Use with [place].  Indications: asthma, chronic cough, bronchiectasis (Patient taking differently: Inhale 4 mL by mouth into the lungs three times daily. Use with [place].  Indications: asthma, chronic cough, bronchiectasis)   ? spironolactone (ALDACTONE) 25 mg tablet Take one tablet by mouth daily. Take with food.   ? tamsulosin (FLOMAX) 0.4 mg capsule Take one capsule by mouth daily.   ? traMADoL (ULTRAM) 50 mg tablet Take one tablet by mouth every 12 hours as needed for Pain.   ? zinc sulfate 220 mg (50 mg elemental zinc) capsule Take 220 mg by mouth daily.

## 2019-12-15 ENCOUNTER — Encounter: Admit: 2019-12-15 | Discharge: 2019-12-15 | Payer: MEDICARE

## 2019-12-15 DIAGNOSIS — M5431 Sciatica, right side: Secondary | ICD-10-CM

## 2019-12-15 DIAGNOSIS — G629 Polyneuropathy, unspecified: Secondary | ICD-10-CM

## 2019-12-15 MED ORDER — TOPIRAMATE 50 MG PO TAB
50 mg | ORAL_TABLET | Freq: Two times a day (BID) | ORAL | 1 refills | Status: AC
Start: 2019-12-15 — End: ?

## 2019-12-15 MED ORDER — TRAMADOL 50 MG PO TAB
50 mg | ORAL_TABLET | Freq: Two times a day (BID) | ORAL | 0 refills | Status: AC | PRN
Start: 2019-12-15 — End: ?

## 2019-12-15 NOTE — Telephone Encounter
I called patient to make aware.  Sent in new rx for 50 mg BID dosing as transcribed.    Etta Grandchild, RN

## 2019-12-15 NOTE — Telephone Encounter
Patient's wife requesting:Tramadol  LR:10/21/2019  LOV: 12/09/2019  This is not a standing order. Routing to Dr. Crecencio Mc to approve.   Etta Grandchild, RN

## 2019-12-15 NOTE — Telephone Encounter
I think we should try a higher dose first if we could. I started low intentionally to ensure no side effects. Would he be open to increasing to 50mg  BID.  If that doesn't work, we can ADD a lower dose of gabapentin to this.

## 2019-12-15 NOTE — Telephone Encounter
Patient's wife called stating new medication Dr. Crecencio Mc prescribed in place of Gabapentin isn't working.  Requesting this to be d/c'd and for Gabapentin to be reordered.    Routing to Dr. Crecencio Mc to advise  Etta Grandchild, RN

## 2019-12-17 ENCOUNTER — Encounter: Admit: 2019-12-17 | Discharge: 2019-12-17 | Payer: MEDICARE

## 2019-12-17 NOTE — Telephone Encounter
Received notification that  [employer] has not been connected since 07/21.     Patient was instructed to look at his/her transmitter to make sure that it is plugged into power and send a manual transmission to reconnect the transmitter. If he/she has any questions about how to send a transmission or if the transmitter does not appear to be working properly, they need to contact the device company directly. Patient was provided with that contact number. Requested the patient send [place] a MyChart message or contact our device nurses at [phone] to let us know after they have sent their transmission. Called preferred phone number [phone], Spoke with Wife Ruben Wood, they  Will send one later on today. [name] verbalized understanding.    CDJ

## 2019-12-23 ENCOUNTER — Encounter: Admit: 2019-12-23 | Discharge: 2019-12-23 | Payer: MEDICARE

## 2019-12-23 MED ORDER — GABAPENTIN 100 MG PO CAP
100 mg | ORAL_CAPSULE | ORAL | 3 refills | Status: AC
Start: 2019-12-23 — End: ?

## 2019-12-23 NOTE — Telephone Encounter
Patient's wife called stating she spoke with Dr. Crecencio Mc yesterday and he was going to send in gabapentin for him.    Patient has an old prescription for this but it has expired.    Routing to Dr. Crecencio Mc to advise.    Etta Grandchild, RN

## 2019-12-23 NOTE — Telephone Encounter
Gabapentin100mg  TID sent the pharmacy. We will likely need to titrate up, but let's start with this.

## 2019-12-23 NOTE — Telephone Encounter
I called patients wife to let her know that medication was sent to the pharmacy.    Etta Grandchild, RN

## 2019-12-25 ENCOUNTER — Encounter: Admit: 2019-12-25 | Discharge: 2019-12-25 | Payer: MEDICARE

## 2019-12-25 NOTE — Telephone Encounter
James and Clarissa LVM from [employer] requesting information on patient, wanting to know if patient is on oxygen. They would like to provide/ship out a POC unit to the patient. They are also asking for documentation of use.    We can call back [phone] or fax chart notes to [phone].    OV note dated 09/12/2019 faxed to [employer].    Faxed INpt note from 11/25/19 w/ Betsey Holiday APRN.    Pt is on 4L at rest. To keep sats above 92%.    I discussed the information with Fayrene Fearing, and noted that I faxed the notes from Epic to him.  Provided NPI for Dr.Sharpe.    Call lasted 9 minutes.

## 2020-01-05 ENCOUNTER — Encounter: Admit: 2020-01-05 | Discharge: 2020-01-05 | Payer: MEDICARE

## 2020-01-05 DIAGNOSIS — J4550 Severe persistent asthma, uncomplicated: Secondary | ICD-10-CM

## 2020-01-05 DIAGNOSIS — R399 Unspecified symptoms and signs involving the genitourinary system: Secondary | ICD-10-CM

## 2020-01-05 MED ORDER — MONTELUKAST 10 MG PO TAB
10 mg | ORAL_TABLET | Freq: Every evening | ORAL | 1 refills | 90.00000 days | Status: AC
Start: 2020-01-05 — End: ?

## 2020-01-05 MED ORDER — TAMSULOSIN 0.4 MG PO CAP
ORAL_CAPSULE | Freq: Every day | ORAL | 1 refills | 90.00000 days | Status: AC
Start: 2020-01-05 — End: ?

## 2020-01-05 NOTE — Telephone Encounter
Patient's wife called stating that he's having extreme pain and would like this to be discussed at his appointment tomorrow.    Has procedure scheduled December 7th for his back, but cannot go that long in this much pain.      Also will be bringing pharmacy forms to be signed at appointment tomorrow or to be given to Dr. Crecencio Mc for when he returns.

## 2020-01-05 NOTE — Telephone Encounter
Pharmacy requesting:Flomax  LR:01/03/2019  LOV: 12/09/2019  Per IM general medicine protocol, medication was signed and sent to pharmacy.   Etta Grandchild, RN

## 2020-01-05 NOTE — Telephone Encounter
Refill Request received.    Patient last seen 09/12/19 with plan to continue Singulair.    Follow up appt scheduled for 03/12/2020.    3 month and 1 refills e-scribed per protocol.

## 2020-01-06 ENCOUNTER — Encounter: Admit: 2020-01-06 | Discharge: 2020-01-06 | Payer: MEDICARE

## 2020-01-06 ENCOUNTER — Ambulatory Visit: Admit: 2020-01-06 | Discharge: 2020-01-07 | Payer: MEDICARE

## 2020-01-06 DIAGNOSIS — I509 Heart failure, unspecified: Secondary | ICD-10-CM

## 2020-01-06 DIAGNOSIS — J45909 Unspecified asthma, uncomplicated: Secondary | ICD-10-CM

## 2020-01-06 DIAGNOSIS — R053 Chronic cough: Secondary | ICD-10-CM

## 2020-01-06 DIAGNOSIS — E782 Mixed hyperlipidemia: Secondary | ICD-10-CM

## 2020-01-06 DIAGNOSIS — J4541 Moderate persistent asthma with (acute) exacerbation: Secondary | ICD-10-CM

## 2020-01-06 DIAGNOSIS — I5042 Chronic combined systolic (congestive) and diastolic (congestive) heart failure: Secondary | ICD-10-CM

## 2020-01-06 DIAGNOSIS — M5431 Sciatica, right side: Secondary | ICD-10-CM

## 2020-01-06 DIAGNOSIS — L03116 Cellulitis of left lower limb: Secondary | ICD-10-CM

## 2020-01-06 DIAGNOSIS — I714 Abdominal aortic aneurysm, without rupture: Secondary | ICD-10-CM

## 2020-01-06 DIAGNOSIS — J449 Chronic obstructive pulmonary disease, unspecified: Secondary | ICD-10-CM

## 2020-01-06 DIAGNOSIS — Z8614 Personal history of Methicillin resistant Staphylococcus aureus infection: Secondary | ICD-10-CM

## 2020-01-06 DIAGNOSIS — J479 Bronchiectasis, uncomplicated: Secondary | ICD-10-CM

## 2020-01-06 DIAGNOSIS — I10 Essential (primary) hypertension: Secondary | ICD-10-CM

## 2020-01-06 DIAGNOSIS — I2510 Atherosclerotic heart disease of native coronary artery without angina pectoris: Secondary | ICD-10-CM

## 2020-01-06 DIAGNOSIS — Z95828 Presence of other vascular implants and grafts: Secondary | ICD-10-CM

## 2020-01-06 DIAGNOSIS — M954 Acquired deformity of chest and rib: Secondary | ICD-10-CM

## 2020-01-06 DIAGNOSIS — J302 Other seasonal allergic rhinitis: Secondary | ICD-10-CM

## 2020-01-06 DIAGNOSIS — R0600 Dyspnea, unspecified: Secondary | ICD-10-CM

## 2020-01-06 DIAGNOSIS — I429 Cardiomyopathy, unspecified: Secondary | ICD-10-CM

## 2020-01-06 DIAGNOSIS — R609 Edema, unspecified: Secondary | ICD-10-CM

## 2020-01-06 DIAGNOSIS — G4733 Obstructive sleep apnea (adult) (pediatric): Secondary | ICD-10-CM

## 2020-01-06 DIAGNOSIS — J189 Pneumonia, unspecified organism: Secondary | ICD-10-CM

## 2020-01-06 DIAGNOSIS — Z8679 Personal history of other diseases of the circulatory system: Secondary | ICD-10-CM

## 2020-01-06 DIAGNOSIS — K219 Gastro-esophageal reflux disease without esophagitis: Secondary | ICD-10-CM

## 2020-01-06 DIAGNOSIS — M961 Postlaminectomy syndrome, not elsewhere classified: Secondary | ICD-10-CM

## 2020-01-06 DIAGNOSIS — N183 CKD (chronic kidney disease) stage 3, GFR 30-59 ml/min (HCC): Secondary | ICD-10-CM

## 2020-01-06 DIAGNOSIS — Z9981 Dependence on supplemental oxygen: Secondary | ICD-10-CM

## 2020-01-06 DIAGNOSIS — K5900 Constipation, unspecified: Secondary | ICD-10-CM

## 2020-01-06 DIAGNOSIS — N401 Enlarged prostate with lower urinary tract symptoms: Secondary | ICD-10-CM

## 2020-01-06 MED ORDER — KETOROLAC 60 MG/2 ML IM SOLN
60 mg | Freq: Once | INTRAMUSCULAR | 0 refills | Status: CP
Start: 2020-01-06 — End: ?
  Administered 2020-01-06: 18:00:00 60 mg via INTRAMUSCULAR

## 2020-01-06 MED ORDER — DOXYCYCLINE HYCLATE 100 MG PO TAB
100 mg | ORAL_TABLET | Freq: Two times a day (BID) | ORAL | 0 refills | 8.00000 days | Status: AC
Start: 2020-01-06 — End: ?

## 2020-01-06 MED ORDER — DULOXETINE 60 MG PO CPDR
60 mg | ORAL_CAPSULE | Freq: Every day | ORAL | 1 refills | 60.00000 days | Status: AC
Start: 2020-01-06 — End: ?

## 2020-01-06 MED ORDER — PREDNISONE 20 MG PO TAB
20 mg | ORAL_TABLET | Freq: Every day | ORAL | 0 refills | Status: AC
Start: 2020-01-06 — End: ?

## 2020-01-08 ENCOUNTER — Ambulatory Visit: Admit: 2020-01-08 | Discharge: 2020-01-08 | Payer: MEDICARE

## 2020-01-08 DIAGNOSIS — I5042 Chronic combined systolic (congestive) and diastolic (congestive) heart failure: Secondary | ICD-10-CM

## 2020-01-09 ENCOUNTER — Encounter: Admit: 2020-01-09 | Discharge: 2020-01-09 | Payer: MEDICARE

## 2020-01-09 NOTE — Telephone Encounter
Patients wife call providing fax number for form that needed to be completed by patient for medication assistance program.      Forms faxed to [phone] confirmation of transmission received.     Notified patient's wife that form had been faxed.  Once completed and returned I will fax to medication assistance program.    Etta Grandchild, RN

## 2020-01-13 ENCOUNTER — Encounter: Admit: 2020-01-13 | Discharge: 2020-01-13 | Payer: MEDICARE

## 2020-01-13 NOTE — Telephone Encounter
Faxed [place] paperwork to get help for cost of Symbicort.

## 2020-01-19 ENCOUNTER — Ambulatory Visit: Admit: 2020-01-19 | Discharge: 2020-01-20 | Payer: MEDICARE

## 2020-01-19 ENCOUNTER — Encounter: Admit: 2020-01-19 | Discharge: 2020-01-19 | Payer: MEDICARE

## 2020-01-19 DIAGNOSIS — I5042 Chronic combined systolic (congestive) and diastolic (congestive) heart failure: Secondary | ICD-10-CM

## 2020-01-19 DIAGNOSIS — I10 Essential (primary) hypertension: Secondary | ICD-10-CM

## 2020-01-19 DIAGNOSIS — M4807 Spinal stenosis, lumbosacral region: Secondary | ICD-10-CM

## 2020-01-19 DIAGNOSIS — J479 Bronchiectasis, uncomplicated: Secondary | ICD-10-CM

## 2020-01-19 DIAGNOSIS — M5431 Sciatica, right side: Secondary | ICD-10-CM

## 2020-01-19 LAB — BASIC METABOLIC PANEL: Lab: 139

## 2020-01-19 LAB — BNP (B-TYPE NATRIURETIC PEPTI): Lab: 126 MMOL/L — AB (ref 5–450)

## 2020-01-19 MED ORDER — BENZONATATE 100 MG PO CAP
100 mg | ORAL_CAPSULE | ORAL | 0 refills
Start: 2020-01-19 — End: ?

## 2020-01-19 MED ORDER — OXYCODONE 5 MG PO TAB
5-10 mg | ORAL_TABLET | ORAL | 0 refills | 6.00000 days | Status: AC | PRN
Start: 2020-01-19 — End: ?

## 2020-01-19 MED ORDER — OXYCODONE 5 MG PO TAB
5-10 mg | ORAL_TABLET | ORAL | 0 refills | 6.00000 days | Status: DC | PRN
Start: 2020-01-19 — End: 2020-01-19

## 2020-01-19 NOTE — Progress Notes
History of Present Illness  Ruben Wood. is a 79 y.o. male     Obtained patient's verbal consent to treat them and their agreement to [facility] financial policy and NPP via this telehealth visit during the [facility]    Ed reports that he is not feeling well today.  He feels like he is filling up with fluid.  He is having increased shortness of breath as well as increased cough and a gurgling sound.  His oxygen levels have been stable.  He does continue to wear his oxygen on a regular basis.  He does have known heart failure but also has bronchiectasis and has frequent infections from this.  He has not had any fevers or chills.  His sputum has not changed any color.  He does have increased lower extremity edema however.  He is under the care of the advanced heart failure center.  He does have a CardioMEMS in place and he just submitted some readings this morning.  He has not had any chest pain.    He is also struggling with severe back pain.  This is very debilitating.  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  The pain is 10/10 in severity.  He is scheduled to see the spine center next week.  He has had to visit the local emergency department because of the pain and they gave him some opioid pain medications.  He does not feel like this is helping much.  He is also on gabapentin and duloxetine.    He has also not had a bowel movement about a week.  Is been taking MiraLAX once a day as well as Colace.  He thinks this is related to the oxycodone that he was given.       Review of Systems  Per HPI    Objective:         ? acetaminophen (TYLENOL) 325 mg tablet Take two tablets by mouth every 6 hours as needed for Pain.   ? albuterol 0.083% (PROVENTIL) 2.5 mg /3 mL (0.083 %) nebulizer solution Inhale 3 mL solution by nebulizer as directed every 6 hours as needed for Wheezing or Shortness of Breath. Indications: asthma (Patient taking differently: Inhale 3 mL solution by nebulizer as directed three times daily as needed for Wheezing or Shortness of Breath. Indications: asthma)   ? albuterol sulfate (PROAIR HFA) 90 mcg/actuation aerosol inhaler Inhale two puffs by mouth into the lungs every 4 hours as needed for Wheezing or Shortness of Breath.   ? allopurinoL (ZYLOPRIM) 300 mg tablet Take 300 mg by mouth daily. Take with food.   ? amitriptyline/gabapentin/emu oil(#) (AMITRIPTYLINE/GABAPENTIN/EMU OIL(#)) 05/24/08 % Apply to affected area TID PRN   ? ASCORBIC ACID-ASCORBATE SODIUM PO Take 500 mg by mouth every morning.   ? aspirin EC 81 mg tablet Take one tablet by mouth daily. Take with food.   ? atorvastatin (LIPITOR) 40 mg tablet TAKE (1) TABLET BY MOUTH ONCE DAILY   ? benzonatate (TESSALON PERLES) 100 mg capsule Take 100 mg by mouth every 8 hours.   ? budesonide-formoterol (SYMBICORT HFA) 160-4.5 mcg/actuation inhalation Inhale two puffs by mouth into the lungs twice daily. Rinse mouth after use.   ? bumetanide (BUMEX) 2 mg tablet Take one tablet by mouth twice daily.   ? cholecalciferol (VITAMIN D-3) 1,000 units tablet Take 1,000 Units by mouth daily.   ? duloxetine DR (CYMBALTA) 60 mg capsule Take  one capsule by mouth daily.   ? empagliflozin (JARDIANCE) 25 mg tablet Take one tablet by mouth daily.   ? finasteride (PROSCAR) 5 mg tablet Take one tablet by mouth daily.   ? fish oil- omega 3-DHA/EPA 300/1,000 mg capsule Take 1 capsule by mouth daily.   ? gabapentin (NEURONTIN) 100 mg capsule Take one capsule by mouth every 8 hours.   ? ipratropium (ATROVENT) 0.03 % nasal spray Apply two sprays to each nostril as directed every 12 hours.   ? Miscellaneous Medical Supply misc Rx: Please wrap legs with ACE bandage from toes as high up to the leg as possible bilaterally  Dx: Lymphedema   ? montelukast (SINGULAIR) 10 mg tablet Take one tablet by mouth at bedtime daily.   ? nitroglycerin (NITROSTAT) 0.4 mg tablet Place 0.4 mg under tongue every 5 minutes as needed for Chest Pain. Max of 3 tablets, call 911.   ? oxyCODONE (ROXICODONE) 5 mg tablet Take one tablet to two tablets by mouth every 8 hours as needed for Pain   ? pantoprazole DR (PROTONIX) 40 mg tablet TAKE (1) TABLET BY MOUTH TWO TIMES A DAY.   ? semaglutide (OZEMPIC) 0.25 mg or 0.5 mg(2 mg/1.5 mL) injection PEN Inject one-quarter mg under the skin every 7 days for 28 days, THEN one-half mg every 7 days for 62 days.   ? sodium chloride 3 % nebulizer solution Inhale 4 mL by mouth into the lungs twice daily. Use with [place].  Indications: asthma, chronic cough, bronchiectasis (Patient taking differently: Inhale 4 mL by mouth into the lungs three times daily. Use with [place].  Indications: asthma, chronic cough, bronchiectasis)   ? spironolactone (ALDACTONE) 25 mg tablet Take one tablet by mouth twice daily. Take with food.   ? tamsulosin (FLOMAX) 0.4 mg capsule TAKE (1) CAPSULE BY MOUTH ONCE DAILY.   ? traMADoL (ULTRAM) 50 mg tablet Take one tablet by mouth every 12 hours as needed for Pain.   ? zinc sulfate 220 mg (50 mg elemental zinc) capsule Take 220 mg by mouth daily.     Vitals:    01/19/20 0930   PainSc: Ten     Vitals:    01/19/20 0930   PainSc: Ten       There is no height or weight on file to calculate BMI.     Physical Exam  Constitutional:       Comments: He appears uncomfortable   HENT:      Head: Normocephalic and atraumatic.   Pulmonary:      Effort: Pulmonary effort is normal.      Comments: No accessory muscle use  Neurological:      General: No focal deficit present.      Mental Status: He is alert.   Psychiatric:         Mood and Affect: Mood normal.         Labwork reviewed:  Lab Results   Component Value Date/Time    HGBA1C 5.9 11/25/2019 02:45 PM    HGBA1C 5.9 09/02/2019 09:50 AM    HGBA1C 6.2 (A) 04/28/2019 12:00 AM    HGBPOC 11.2 (L) 10/25/2012 10:58 AM    HGBPOC 12.9 (L) 10/25/2012 08:55 AM    MCALBR 7.6 04/29/2019 12:00 AM    TSH 1.79 11/25/2019 02:45 PM FREET4R 0.99 12/15/2014 09:12 AM    CHOL 106 12/05/2018 01:57 AM    TRIG 108 12/05/2018 01:57 AM    HDL 31 (L) 12/05/2018 01:57 AM  LDL 60 12/05/2018 01:57 AM    NA 142 12/03/2019 04:51 AM    K 4.3 12/03/2019 04:51 AM    CL 100 12/03/2019 04:51 AM    CO2 34 (H) 12/03/2019 04:51 AM    GAP 8 12/03/2019 04:51 AM    BUN 33 (H) 12/03/2019 04:51 AM    CR 1.4 (H) 12/09/2019 11:51 AM    CR 1.28 (H) 12/03/2019 04:51 AM    GLU 129 (H) 12/03/2019 04:51 AM    CA 8.5 12/03/2019 04:51 AM    PO4 3.2 12/21/2018 10:34 PM    ALBUMIN 3.7 11/25/2019 02:45 PM    TOTPROT 6.6 11/25/2019 02:45 PM    ALKPHOS 67 11/25/2019 02:45 PM    AST 19 11/25/2019 02:45 PM    ALT 16 11/25/2019 02:45 PM    TOTBILI 0.9 11/25/2019 02:45 PM    GFR 54 (L) 12/03/2019 04:51 AM    GFRAA >60 12/03/2019 04:51 AM    PSA 2.96 11/25/2018 12:00 AM              Assessment and Plan:    1. Spinal stenosis of lumbosacral region    2. Bilateral sciatica    3. Chronic combined systolic and diastolic congestive heart failure, NYHA class 2 (HCC)    4. Bronchiectasis without complication (HCC)    5. Essential (primary) hypertension       For his spinal stenosis and sciatica, the pain is significantly uncontrolled at this time.  He is scheduled to see Dr. Katrinka Blazing in the spine center next week.  In the meantime, we will do our best to treat this medically.  I did provide him a short course of oxycodone 5 to 10 mg every 8 hours as needed.  I did talk to him at length about the risk and benefits of this medication and that this will be a short-term medication.  I want him to continue gabapentin and duloxetine in the meantime as well for pain control.    I am worried about his increased cough and shortness of breath.  His overall symptoms appear most consistent with decompensated heart failure.  I did reach out to Dr. Sherryll Burger to see what his most recent CardioMEMS readings are.  I am getting get a BNP as well as a BMP and we will fax those down to [facility] which is more local.  I also want a CXR.     Total of 40 minutes were spent on the same day of the visit including preparing to see the patient, obtaining and/or reviewing separately obtained history, performing a medically appropriate examination and/or evaluation, counseling and educating the patient/family/caregiver, ordering medications, tests, or procedures, referring and communication with other health care professionals, documenting clinical information in the electronic or other health record, independently interpreting results and communicating results to the patient/family/caregiver, and care coordination.         Patient Instructions     Future Appointments   Date Time Provider Department Center   01/27/2020  3:30 PM Joanie Coddington, MD [facility] SPINE   02/03/2020 11:00 AM Albakour, Alyssa Salena Saner, APRN-NP [facility] IM   02/09/2020 12:00 AM [facility] CVM Procedur   03/11/2020 12:00 AM [facility] CVM Procedur   03/12/2020 11:20 AM Deveron Furlong, MD [facility] IM   03/16/2020 12:00 PM Arneta Cliche, MD [facility] CVM Exam   03/19/2020  9:40 AM Hyman Bower A, DO [facility] IM   03/23/2020 11:40 AM Cecely Rengel,  [name], MD [facility] IM   04/12/2020 12:00 AM [facility] CVM Procedur   05/13/2020 12:00 AM [facility] CVM Procedur   05/19/2020 11:15 AM [name], Lenice Llamas, PA-C [facility] Urology   06/14/2020 12:00 AM [facility] CVM Procedur       If you are on Mychart, you will now be able to read your clinic note from me.  My hope is that this will improve your engagement and insight into your health care and improve the accuracy of your medical record.  If you find inaccuracies, I apologize.  Please bring any concerns or inaccuracies to your next appointment.  Please remember that medical language and documentation is very different from how you and I speak and our dictation software is not perfect.    General Instructions:  ? How to reach me:   Please send a MyChart message to the General Medicine clinic or call Wynona Canes at [phone].    ? How to get a medication refill:  Please use the MyChart Refill request or contact your pharmacy directly to request medication refills. Please allow 48 hours.     ? How to receive your test results:  If you have signed up for MyChart, you will receive your test results and messages from me this way.  Otherwise, you will get a phone call or letter.   If you are expecting results and have not heard from my office within 2 weeks of your testing, please send a MyChart message or call my office.     ? Scheduling:  Our Scheduling phone number is [phone].  Same Day appointments are usually available. Please ask for an annual or yearly physical appointment if it has been over 1 year since our last appointment.  ? Appointment Reminders on your cell phone: Make sure we have your cell phone number, and Text [place] to [phone].  ? Support groups for many chronic illnesses are available through [facility]: [facility] or [phone].             No follow-ups on file.

## 2020-01-19 NOTE — Telephone Encounter
patient's wife states they went to the pharmacy and they did not have order for tessalon perls.      Routing to Dr. Crecencio Mc to advise/ approve.    Etta Grandchild, RN

## 2020-01-19 NOTE — Progress Notes
Did patient read financial policy, consent to treat, and notice of privacy practices? Yes    Does the patient give verbal consent to each policy? Yes    Does the patient have any vitals to report? No    N/A Vitals charted in O2? N/A    Is the patient in pain? 10 = Worst pain ever    Screening questions completed? Yes    Is the patient able to acces the Mychart message with the start visit link? Yes    Is patient in "virtual waiting room" Yes    Etta Grandchild, RN

## 2020-01-19 NOTE — Patient Instructions
Future Appointments   Date Time Provider Department Center   01/27/2020  3:30 PM Joanie Coddington, MD [facility] SPINE   02/03/2020 11:00 AM Albakour, Alyssa Salena Saner, APRN-NP [facility] IM   02/09/2020 12:00 AM [facility] CVM Procedur   03/11/2020 12:00 AM [facility] CVM Procedur   03/12/2020 11:20 AM Deveron Furlong, MD [facility] IM   03/16/2020 12:00 PM Arneta Cliche, MD [facility] CVM Exam   03/19/2020  9:40 AM Hyman Bower A, DO [facility] IM   03/23/2020 11:40 AM Alba Kriesel, [name], MD [facility] IM   04/12/2020 12:00 AM [facility] CVM Procedur   05/13/2020 12:00 AM [facility] CVM Procedur   05/19/2020 11:15 AM [name], Lenice Llamas, PA-C [facility] Urology   06/14/2020 12:00 AM [facility] CVM Procedur       If you are on Mychart, you will now be able to read your clinic note from me.  My hope is that this will improve your engagement and insight into your health care and improve the accuracy of your medical record.  If you find inaccuracies, I apologize.  Please bring any concerns or inaccuracies to your next appointment.  Please remember that medical language and documentation is very different from how you and I speak and our dictation software is not perfect.    General Instructions:  ? How to reach me:   Please send a MyChart message to the General Medicine clinic or call Wynona Canes at [phone].    ? How to get a medication refill:  Please use the MyChart Refill request or contact your pharmacy directly to request medication refills. Please allow 48 hours.     ? How to receive your test results:  If you have signed up for MyChart, you will receive your test results and messages from me this way.  Otherwise, you will get a phone call or letter.   If you are expecting results and have not heard from my office within 2 weeks of your testing, please send a MyChart message or call my office.     ? Scheduling:  Our Scheduling phone number is [phone].  Same Day appointments are usually available. Please ask for an annual or yearly physical appointment if it has been over 1 year since our last appointment.  ? Appointment Reminders on your cell phone: Make sure we have your cell phone number, and Text [place] to [phone].  ? Support groups for many chronic illnesses are available through [facility]: [email or web address] or [phone].

## 2020-01-20 ENCOUNTER — Encounter: Admit: 2020-01-20 | Discharge: 2020-01-20 | Payer: MEDICARE

## 2020-01-20 NOTE — Telephone Encounter
I called patient and he states he's feeling same as yesterday.    Patient states he hasn't lost very much fluid.  During call patient had frequent dry coughing    Routing to Dr. Crecencio Mc    Etta Grandchild, RN

## 2020-01-20 NOTE — Telephone Encounter
-----   Message from [name], MD sent at 01/19/2020  6:36 PM CST -----  I talked to Ruben Wood. This appears like a decompensated heart failure episode with AKI. We can try to do this outpatient, but he may need inpatient admission if we cannot turn this around.     Tonight, I want him to take 4mg  Bumex (double his normal dose).     Tomorrow, 11/30, he will take bumex 2mg  x 3 (spread out by 6 hours).     I will want BMP on Wednesday at [facility], as early as we can get it in the morning.     Monday, can you please fax a BMP order for Wednesday to [facility]?  Also, can you please check in with Ruben Wood on Tuesday to see how his symptoms are?

## 2020-01-20 NOTE — Telephone Encounter
Let's see how his labs look tomorrow after extra bumex today. I was able touch base with Dr. Sherryll Burger who is aware of the plan.

## 2020-01-21 ENCOUNTER — Encounter: Admit: 2020-01-21 | Discharge: 2020-01-21 | Payer: MEDICARE

## 2020-01-21 DIAGNOSIS — I5042 Chronic combined systolic (congestive) and diastolic (congestive) heart failure: Secondary | ICD-10-CM

## 2020-01-21 LAB — BASIC METABOLIC PANEL
Lab: 1.7 — AB (ref 0.8–1.3)
Lab: 131 — AB (ref 65–110)
Lab: 140
Lab: 32 — AB (ref 21–32)
Lab: 4
Lab: 41
Lab: 41 — AB (ref 7–18)
Lab: 9.4
Lab: 98

## 2020-01-22 ENCOUNTER — Encounter: Admit: 2020-01-22 | Discharge: 2020-01-22 | Payer: MEDICARE

## 2020-01-22 ENCOUNTER — Ambulatory Visit: Admit: 2020-01-22 | Discharge: 2020-01-23 | Payer: MEDICARE

## 2020-01-22 DIAGNOSIS — Z8614 Personal history of Methicillin resistant Staphylococcus aureus infection: Secondary | ICD-10-CM

## 2020-01-22 DIAGNOSIS — R609 Edema, unspecified: Secondary | ICD-10-CM

## 2020-01-22 DIAGNOSIS — I2510 Atherosclerotic heart disease of native coronary artery without angina pectoris: Secondary | ICD-10-CM

## 2020-01-22 DIAGNOSIS — N179 Acute kidney failure, unspecified: Secondary | ICD-10-CM

## 2020-01-22 DIAGNOSIS — K5900 Constipation, unspecified: Secondary | ICD-10-CM

## 2020-01-22 DIAGNOSIS — Z8679 Personal history of other diseases of the circulatory system: Secondary | ICD-10-CM

## 2020-01-22 DIAGNOSIS — L03116 Cellulitis of left lower limb: Secondary | ICD-10-CM

## 2020-01-22 DIAGNOSIS — I429 Cardiomyopathy, unspecified: Secondary | ICD-10-CM

## 2020-01-22 DIAGNOSIS — I5042 Chronic combined systolic (congestive) and diastolic (congestive) heart failure: Secondary | ICD-10-CM

## 2020-01-22 DIAGNOSIS — J189 Pneumonia, unspecified organism: Secondary | ICD-10-CM

## 2020-01-22 DIAGNOSIS — M961 Postlaminectomy syndrome, not elsewhere classified: Secondary | ICD-10-CM

## 2020-01-22 DIAGNOSIS — J479 Bronchiectasis, uncomplicated: Secondary | ICD-10-CM

## 2020-01-22 DIAGNOSIS — Z95828 Presence of other vascular implants and grafts: Secondary | ICD-10-CM

## 2020-01-22 DIAGNOSIS — I5043 Acute on chronic combined systolic (congestive) and diastolic (congestive) heart failure: Principal | ICD-10-CM

## 2020-01-22 DIAGNOSIS — R053 Chronic cough: Secondary | ICD-10-CM

## 2020-01-22 DIAGNOSIS — I714 Abdominal aortic aneurysm, without rupture: Secondary | ICD-10-CM

## 2020-01-22 DIAGNOSIS — N401 Enlarged prostate with lower urinary tract symptoms: Secondary | ICD-10-CM

## 2020-01-22 DIAGNOSIS — J449 Chronic obstructive pulmonary disease, unspecified: Secondary | ICD-10-CM

## 2020-01-22 DIAGNOSIS — K219 Gastro-esophageal reflux disease without esophagitis: Secondary | ICD-10-CM

## 2020-01-22 DIAGNOSIS — I10 Essential (primary) hypertension: Secondary | ICD-10-CM

## 2020-01-22 DIAGNOSIS — E782 Mixed hyperlipidemia: Secondary | ICD-10-CM

## 2020-01-22 DIAGNOSIS — N183 CKD (chronic kidney disease) stage 3, GFR 30-59 ml/min (HCC): Secondary | ICD-10-CM

## 2020-01-22 DIAGNOSIS — R0600 Dyspnea, unspecified: Secondary | ICD-10-CM

## 2020-01-22 DIAGNOSIS — G4733 Obstructive sleep apnea (adult) (pediatric): Secondary | ICD-10-CM

## 2020-01-22 DIAGNOSIS — M954 Acquired deformity of chest and rib: Secondary | ICD-10-CM

## 2020-01-22 DIAGNOSIS — Z9981 Dependence on supplemental oxygen: Secondary | ICD-10-CM

## 2020-01-22 DIAGNOSIS — J45909 Unspecified asthma, uncomplicated: Secondary | ICD-10-CM

## 2020-01-22 DIAGNOSIS — I509 Heart failure, unspecified: Secondary | ICD-10-CM

## 2020-01-22 DIAGNOSIS — J302 Other seasonal allergic rhinitis: Secondary | ICD-10-CM

## 2020-01-22 MED ORDER — DOXYCYCLINE HYCLATE 100 MG PO TAB
100 mg | ORAL_TABLET | Freq: Two times a day (BID) | ORAL | 0 refills | 8.00000 days | Status: AC
Start: 2020-01-22 — End: ?

## 2020-01-22 NOTE — Progress Notes
History of Present Illness  Ruben Wood. is a 79 y.o. male     Obtained patient's verbal consent to treat them and their agreement to [facility] financial policy and NPP via this telehealth visit during the [facility] Emergency    I have seen today in follow-up.  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  He had a increased lower extremity swelling and increase shortness of breath from his baseline.  He also developed an AKI which we had attributed to his volume retention and congestive nephropathy.  We had him increase his Bumex to 3 times daily for the past several days.  His kidney function has remained stable, but it has not improved.  He notes subjective improvement in his shortness of breath as well as a lower extremity swelling.  He also feels like he is breathing much easier in general.  He denies any fevers or chills.  He does have chronic bronchiectasis he notes that this feels different than the flares of bronchiectasis that he has had in the past and feels more like heart failure.    He does have a new problem today of redness and warmth in the left lower extremity.  He has known venous insufficiency and hypertension in the lower extremities and he has had many battles with cellulitis in the past.  This feels similar to him.  The redness is starting to expand.  It does feel warm to the touch to him.  He does not have any fevers or chills systemically.    Lastly, he has not had a bowel movement about a week.  He has been taking MiraLAX and to started Metamucil.  He is also been using Colace.  He was given some oxycodone for his back pain he thinks that this is what made things back up on him.  He does not have any abdominal pain.       Review of Systems  Per HPI    Objective:         ? acetaminophen (TYLENOL) 325 mg tablet Take two tablets by mouth every 6 hours as needed for Pain.   ? albuterol 0.083% (PROVENTIL) 2.5 mg /3 mL (0.083 %) nebulizer solution Inhale 3 mL solution by nebulizer as directed every 6 hours as needed for Wheezing or Shortness of Breath. Indications: asthma (Patient taking differently: Inhale 3 mL solution by nebulizer as directed three times daily as needed for Wheezing or Shortness of Breath. Indications: asthma)   ? albuterol sulfate (PROAIR HFA) 90 mcg/actuation aerosol inhaler Inhale two puffs by mouth into the lungs every 4 hours as needed for Wheezing or Shortness of Breath.   ? allopurinoL (ZYLOPRIM) 300 mg tablet Take 300 mg by mouth daily. Take with food.   ? amitriptyline/gabapentin/emu oil(#) (AMITRIPTYLINE/GABAPENTIN/EMU OIL(#)) 05/24/08 % Apply to affected area TID PRN   ? ASCORBIC ACID-ASCORBATE SODIUM PO Take 500 mg by mouth every morning.   ? aspirin EC 81 mg tablet Take one tablet by mouth daily. Take with food.   ? atorvastatin (LIPITOR) 40 mg tablet TAKE (1) TABLET BY MOUTH ONCE DAILY   ? benzonatate (TESSALON PERLES) 100 mg capsule Take one capsule by mouth every 8 hours.   ? budesonide-formoterol (SYMBICORT HFA) 160-4.5 mcg/actuation inhalation Inhale two puffs by mouth into the lungs twice daily. Rinse mouth after use.   ? bumetanide (BUMEX) 2 mg tablet Take one tablet by mouth twice daily.   ?  cholecalciferol (VITAMIN D-3) 1,000 units tablet Take 1,000 Units by mouth daily.   ? doxycycline hyclate (VIBRACIN) 100 mg tablet Take one tablet by mouth twice daily for 7 days.   ? duloxetine DR (CYMBALTA) 60 mg capsule Take one capsule by mouth daily.   ? empagliflozin (JARDIANCE) 25 mg tablet Take one tablet by mouth daily.   ? finasteride (PROSCAR) 5 mg tablet Take one tablet by mouth daily.   ? fish oil- omega 3-DHA/EPA 300/1,000 mg capsule Take 1 capsule by mouth daily.   ? gabapentin (NEURONTIN) 100 mg capsule Take one capsule by mouth every 8 hours.   ? ipratropium (ATROVENT) 0.03 % nasal spray Apply two sprays to each nostril as directed every 12 hours.   ? Miscellaneous Medical Supply misc Rx: Please wrap legs with ACE bandage from toes as high up to the leg as possible bilaterally  Dx: Lymphedema   ? montelukast (SINGULAIR) 10 mg tablet Take one tablet by mouth at bedtime daily.   ? nitroglycerin (NITROSTAT) 0.4 mg tablet Place 0.4 mg under tongue every 5 minutes as needed for Chest Pain. Max of 3 tablets, call 911.   ? oxyCODONE (ROXICODONE) 5 mg tablet Take one tablet to two tablets by mouth every 8 hours as needed for Pain   ? pantoprazole DR (PROTONIX) 40 mg tablet TAKE (1) TABLET BY MOUTH TWO TIMES A DAY.   ? semaglutide (OZEMPIC) 0.25 mg or 0.5 mg(2 mg/1.5 mL) injection PEN Inject one-quarter mg under the skin every 7 days for 28 days, THEN one-half mg every 7 days for 62 days.   ? sodium chloride 3 % nebulizer solution Inhale 4 mL by mouth into the lungs twice daily. Use with [place].  Indications: asthma, chronic cough, bronchiectasis (Patient taking differently: Inhale 4 mL by mouth into the lungs three times daily. Use with [place].  Indications: asthma, chronic cough, bronchiectasis)   ? spironolactone (ALDACTONE) 25 mg tablet Take one tablet by mouth twice daily. Take with food.   ? tamsulosin (FLOMAX) 0.4 mg capsule TAKE (1) CAPSULE BY MOUTH ONCE DAILY.   ? traMADoL (ULTRAM) 50 mg tablet Take one tablet by mouth every 12 hours as needed for Pain.   ? zinc sulfate 220 mg (50 mg elemental zinc) capsule Take 220 mg by mouth daily.     There were no vitals filed for this visit.    There is no height or weight on file to calculate BMI.     Physical Exam  Constitutional:       Appearance: He is not ill-appearing or diaphoretic.   HENT:      Head: Normocephalic and atraumatic.   Pulmonary:      Effort: Pulmonary effort is normal.      Comments: No accessory muscle use  Musculoskeletal:      Right lower leg: Edema present.      Left lower leg: Edema present.   Skin:     Findings: Erythema present.      Comments: He has hyperpigmentation in the lower extremities bilaterally with liposclerosis present.  On the left lower extremity does appear more erythematous than the right.  There is a shallow ulcer that I can see on the anterior aspect.  When his wife touches his left leg, she notes that it feels warmer to the touch compared to his right.   Neurological:      General: No focal deficit present.      Mental Status: He is alert.   Psychiatric:  Mood and Affect: Mood normal.         Labwork reviewed:  Lab Results   Component Value Date/Time    HGBA1C 5.9 11/25/2019 02:45 PM    HGBA1C 5.9 09/02/2019 09:50 AM    HGBA1C 6.2 (A) 04/28/2019 12:00 AM    HGBPOC 11.2 (L) 10/25/2012 10:58 AM    HGBPOC 12.9 (L) 10/25/2012 08:55 AM    MCALBR 7.6 04/29/2019 12:00 AM    TSH 1.79 11/25/2019 02:45 PM    FREET4R 0.99 12/15/2014 09:12 AM    CHOL 106 12/05/2018 01:57 AM    TRIG 108 12/05/2018 01:57 AM    HDL 31 (L) 12/05/2018 01:57 AM    LDL 60 12/05/2018 01:57 AM    NA 140 01/21/2020 12:00 AM    K 4.0 01/21/2020 12:00 AM    CL 98 01/21/2020 12:00 AM    CO2 32.5 (A) 01/21/2020 12:00 AM    GAP 8 12/03/2019 04:51 AM    BUN 41.0 (A) 01/21/2020 12:00 AM    CR 1.7 (A) 01/21/2020 12:00 AM    GLU 131 (A) 01/21/2020 12:00 AM    CA 9.4 01/21/2020 12:00 AM    PO4 3.2 12/21/2018 10:34 PM    ALBUMIN 3.7 11/25/2019 02:45 PM    TOTPROT 6.6 11/25/2019 02:45 PM    ALKPHOS 67 11/25/2019 02:45 PM    AST 19 11/25/2019 02:45 PM    ALT 16 11/25/2019 02:45 PM    TOTBILI 0.9 11/25/2019 02:45 PM    GFR 41 01/21/2020 12:00 AM    GFRAA >60 12/03/2019 04:51 AM    PSA 2.96 11/25/2018 12:00 AM              Assessment and Plan:    1. Acute on chronic combined systolic and diastolic congestive heart failure (HCC)    2. Left leg cellulitis    3. Constipation, unspecified constipation type    4. Acute kidney injury superimposed on CKD (HCC)      I am seeing Ed for acute on chronic decompensated heart failure.  He does have evidence of volume overload with objective evidence with an elevated BNP level.  He has been having subjective improvement with Bumex 3 times a day.  He typically takes it just twice a day.  I want him to continue with the 3 times a day regimen today and tomorrow.  On a recheck a BMP tomorrow which we can get [facility] at [facility].  He does have an acute kidney injury which I think is congestive nephropathy and I expect this to start improving as his volume status improves.    He does have a new problem of left leg cellulitis.  This is a little difficult to assess in a virtual format, but everything is pointing towards a true underlying skin infection.  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  I want a start with treating him with doxycycline 100 mg twice daily.  He does have MRSA risk factors and I think the doxycycline should get [place] coverage for that.    As for the constipation, I want him to continue with the MiraLAX 3 times a day as needed.  I advised that he use a suppository today to try to get things moving.  He will continue with Metamucil and docusate orally.    Total of 40 minutes were spent on the same day of the visit including preparing to see the patient,  obtaining and/or reviewing separately obtained history, performing a medically appropriate examination and/or evaluation, counseling and educating the patient/family/caregiver, ordering medications, tests, or procedures, referring and communication with other health care professionals, documenting clinical information in the electronic or other health record, independently interpreting results and communicating results to the patient/family/caregiver, and care coordination.         There are no Patient Instructions on file for this visit.    No follow-ups on file.

## 2020-01-26 ENCOUNTER — Encounter: Admit: 2020-01-26 | Discharge: 2020-01-26 | Payer: MEDICARE

## 2020-01-26 ENCOUNTER — Ambulatory Visit: Admit: 2020-01-26 | Discharge: 2020-01-26 | Payer: MEDICARE

## 2020-01-26 DIAGNOSIS — R1084 Generalized abdominal pain: Secondary | ICD-10-CM

## 2020-01-26 DIAGNOSIS — N179 Acute kidney failure, unspecified: Secondary | ICD-10-CM

## 2020-01-26 LAB — BASIC METABOLIC PANEL
Lab: 1.5 M/UL — AB (ref 0.8–1.3)
Lab: 100
Lab: 141
Lab: 30
Lab: 4
Lab: 8.9 MMOL/L — ABNORMAL LOW (ref 3.5–5.1)

## 2020-01-27 ENCOUNTER — Encounter: Admit: 2020-01-27 | Discharge: 2020-01-27 | Payer: MEDICARE

## 2020-01-27 ENCOUNTER — Ambulatory Visit: Admit: 2020-01-27 | Discharge: 2020-01-27 | Payer: MEDICARE

## 2020-01-27 DIAGNOSIS — I10 Essential (primary) hypertension: Secondary | ICD-10-CM

## 2020-01-27 DIAGNOSIS — M961 Postlaminectomy syndrome, not elsewhere classified: Secondary | ICD-10-CM

## 2020-01-27 DIAGNOSIS — I509 Heart failure, unspecified: Secondary | ICD-10-CM

## 2020-01-27 DIAGNOSIS — R0600 Dyspnea, unspecified: Secondary | ICD-10-CM

## 2020-01-27 DIAGNOSIS — Z9981 Dependence on supplemental oxygen: Secondary | ICD-10-CM

## 2020-01-27 DIAGNOSIS — N401 Enlarged prostate with lower urinary tract symptoms: Secondary | ICD-10-CM

## 2020-01-27 DIAGNOSIS — J189 Pneumonia, unspecified organism: Secondary | ICD-10-CM

## 2020-01-27 DIAGNOSIS — K219 Gastro-esophageal reflux disease without esophagitis: Secondary | ICD-10-CM

## 2020-01-27 DIAGNOSIS — M954 Acquired deformity of chest and rib: Secondary | ICD-10-CM

## 2020-01-27 DIAGNOSIS — L03116 Cellulitis of left lower limb: Secondary | ICD-10-CM

## 2020-01-27 DIAGNOSIS — I5042 Chronic combined systolic (congestive) and diastolic (congestive) heart failure: Secondary | ICD-10-CM

## 2020-01-27 DIAGNOSIS — J302 Other seasonal allergic rhinitis: Secondary | ICD-10-CM

## 2020-01-27 DIAGNOSIS — M47816 Spondylosis without myelopathy or radiculopathy, lumbar region: Secondary | ICD-10-CM

## 2020-01-27 DIAGNOSIS — Z8614 Personal history of Methicillin resistant Staphylococcus aureus infection: Secondary | ICD-10-CM

## 2020-01-27 DIAGNOSIS — J45909 Unspecified asthma, uncomplicated: Secondary | ICD-10-CM

## 2020-01-27 DIAGNOSIS — Z95828 Presence of other vascular implants and grafts: Secondary | ICD-10-CM

## 2020-01-27 DIAGNOSIS — J479 Bronchiectasis, uncomplicated: Secondary | ICD-10-CM

## 2020-01-27 DIAGNOSIS — E782 Mixed hyperlipidemia: Secondary | ICD-10-CM

## 2020-01-27 DIAGNOSIS — I714 Abdominal aortic aneurysm, without rupture: Secondary | ICD-10-CM

## 2020-01-27 DIAGNOSIS — I2510 Atherosclerotic heart disease of native coronary artery without angina pectoris: Secondary | ICD-10-CM

## 2020-01-27 DIAGNOSIS — Z8679 Personal history of other diseases of the circulatory system: Secondary | ICD-10-CM

## 2020-01-27 DIAGNOSIS — J449 Chronic obstructive pulmonary disease, unspecified: Secondary | ICD-10-CM

## 2020-01-27 DIAGNOSIS — I429 Cardiomyopathy, unspecified: Secondary | ICD-10-CM

## 2020-01-27 DIAGNOSIS — N183 CKD (chronic kidney disease) stage 3, GFR 30-59 ml/min (HCC): Secondary | ICD-10-CM

## 2020-01-27 DIAGNOSIS — R609 Edema, unspecified: Secondary | ICD-10-CM

## 2020-01-27 DIAGNOSIS — R053 Chronic cough: Secondary | ICD-10-CM

## 2020-01-27 DIAGNOSIS — G4733 Obstructive sleep apnea (adult) (pediatric): Secondary | ICD-10-CM

## 2020-01-27 MED ORDER — FENTANYL CITRATE (PF) 50 MCG/ML IJ SOLN
25-100 ug | Freq: Once | INTRAVENOUS | 0 refills | Status: CP
Start: 2020-01-27 — End: ?
  Administered 2020-01-27: 22:00:00 75 ug via INTRAVENOUS

## 2020-01-27 MED ORDER — BUPIVACAINE (PF) 0.5 % (5 MG/ML) IJ SOLN
3 mL | Freq: Once | INTRAMUSCULAR | 0 refills | Status: CP
Start: 2020-01-27 — End: ?
  Administered 2020-01-27: 22:00:00 3 mL via INTRAMUSCULAR

## 2020-01-27 MED ORDER — MIDAZOLAM 1 MG/ML IJ SOLN
1-5 mg | Freq: Once | INTRAVENOUS | 0 refills | Status: CP
Start: 2020-01-27 — End: ?
  Administered 2020-01-27: 22:00:00 1 mg via INTRAVENOUS

## 2020-01-27 NOTE — Progress Notes
Sedation physician present in room. Recent vitals and patient condition reviewed between sedation physician and nurse. Reassessment completed. Determination made to proceed with planned sedation.

## 2020-01-27 NOTE — Patient Instructions
Discharge Instructions for Radiofrequency Ablation    Important information following your procedure today: You may NOT drive today    1. Go directly home and rest. You may resume your regular activities and exercise tomorrow.   2. You may experience soreness at the injection site. Apply ice at 20 minute intervals for             the next 24 hours. Avoid application of direct heat, hot showers or hot tubs today.  3. It is not uncommon to experience an increase in pain for several days and up to a week after the procedure.  4. Though the procedure is generally safe and complications are rare, we do ask that you be aware of any of the following:   ? Any swelling, persistent redness, new bleeding, or drainage from the site of the injection.  ? You should not experience a severe headache.  ? You should not run a fever over 101? F.  ? New onset of sharp, severe back & or neck pain.  ? New onset of upper or lower extremity numbness or weakness.  ? New difficulty controlling bowel or bladder function after the injection.  ? New shortness of breath.    If any of these occur, please call to report this occurrence to Dr. Katrinka Blazing at [phone]. If you are calling after 4:00 p.m. or on weekends and holidays please call [phone] and ask to have the resident physician on call for the physician paged or go to your local emergency room.  5. The beneficial effect from the radiofrequency procedure may take several weeks to be demonstrated.  6. Take medications as directed.   7. Please call the nurse at the number listed above with any questions.        The following medications were used: Lidocaine , Bupivicaine  , Versed and Fentanyl    If you are unable to keep your upcoming appointment, please notify the [facility] scheduler at [phone] at least 24 hours in advance.        Recovery After Procedural Sedation (Adult)  You have been given medicine by vein to make you sleep during your surgery. This may have included both a pain medicine and sleeping medicine. Most of the effects have worn off. But you may still have some drowsiness for the next 6 to 8 hours.   Home care  Follow these guidelines when you get home:   ? For the next 8 hours, you should be watched by a responsible adult. This person should make sure your condition is not getting worse.  ? Don't drink any alcohol?for the next 24 hours.  ? Don't drive, operate dangerous machinery, or make important business or personal decisions?during the next 24 hours.  Note: Your healthcare provider may tell you not to take any medicine by mouth for pain or sleep in the next 4 hours. These medicines may react with the medicines you were given in the hospital. This could cause a much stronger response than usual.   Follow-up care  Follow up with your healthcare provider if you are not alert and back to your usual level of activity within 12 hours.   When to seek medical advice  Call your healthcare provider right away if any of these occur:   ? Drowsiness gets worse  ? Weakness or dizziness gets worse  ? Repeated vomiting  ? You can't be awakened  ? Fever  ? New rash  [employer] last reviewed this educational content  on 10/21/2017  ? 2000-2021 [employer], [place]. All rights reserved. This information is not intended as a substitute for professional medical care. Always follow your healthcare professional's instructions.

## 2020-01-27 NOTE — Progress Notes
[facility]  INTERVENTIONAL PAIN PROCEDURE HISTORY AND PHYSICAL    Chief Complaint   Patient presents with   ? Lower Back - Pain       HISTORY OF PRESENT ILLNESS:    Patient returns today for interventional treatment of axial lumbar pain. Patient denies any recent fevers, chills, infection, antibiotics, coagulopathy or contra-indicated anticoagulants. Risks of the procedure were discussed including but not limited to bleeding, infection, damage to surrounding structures and reaction to medications. Patient reports understanding and has elected to proceed with the procedure.       Medical History:   Diagnosis Date   ? AAA (abdominal aortic aneurysm) (HCC) 10/15/2012   ? Asthma    ? BPH with obstruction/lower urinary tract symptoms    ? Bronchiectasis (HCC)    ? CAD (coronary artery disease), native coronary artery 08/15/2012    07/11/2002- Cath @ [facility] showed Severe double vessel disease(OMB of circumflex and distal circ)  inferior-basilar dyskinesis.                 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  09/12/12   Cath    ? Cardiomyopathy (HCC) 07/19/2014   ? Cellulitis of left lower extremity 11/12/2014    10/2014: admitted with cellulitis, [place] negative for venous clot, MRI without osteomyelitis.    11/12/2014 In clinic today, still with cellulitis.  Per patient and his wife, the erythema may be extending.  Cultures from drainage in hospital with MSSA, but Blood Cx negative.  No e/o osteomyelitis.  My concern is that this antibiotic regimen is not adequately treating his cellulitis.  We will broaden coverage to get MRSA with doxycycline.  Patient and wife given strict call/return criteria.  I will see patient in 3-4 days for re-evaluation.  No fever today.  Plan:  Stop cefpodoxime and start doxycylcine  11/17/2014 Much better, no warmth and erythema minimal.   Plan: continue doxycyline for full 10 day course.      ? Chest wall deformity 07/09/2012    Chest wall reconstruction on 07/09/12    ? CHF (congestive heart failure) (HCC)    ? 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 Chronic cough 08/29/2012   ? 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 COPD (chronic obstructive pulmonary disease) (HCC) 08/13/2012   ? Dyspnea    ? Edema    ? GERD (gastroesophageal reflux disease) 08/13/2012   ? History of CHF (congestive heart failure) 08/13/2012   ? History of MRSA infection 10/14/2012   ? 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    ? Hypertension 08/13/2012   ? 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 Morbid obesity (HCC) 08/13/2012   ? On supplemental oxygen therapy    ? OSA on CPAP 08/13/2012   ? Pneumonia 04/2012    [facility]   ? Seasonal allergic reaction        Surgical History:   Procedure Laterality Date   ? LUNG SURGERY  2015    Bio bridge in left lung/rib cage was broken  from coughing   ? BACK SURGERY  Jan 2016   ? Right Heart Catheterization Right 11/06/2014    Performed by Cath, Physician at [facility] CATH LAB   ? CARDIAC DEFIBRILLATOR PLACEMENT  2017   ? Right Heart Catheterization With Insertion Pulmonary Artery Sensor Right 03/30/2015    Performed by Harley Alto, MD at [facility] CATH LAB   ? Insert CRT-D and Leads Right 11/25/2015    Performed by Deniece Ree, MD at [facility] EP LAB   ? Fluoroscopy N/A 11/25/2015 Performed by Deniece Ree, MD at [facility] EP LAB   ? Defibrillation Threshold Testing at ICD Implant N/A 11/25/2015    Performed by Deniece Ree, MD at [facility] EP LAB   ? VARICOSE VEIN SURGERY Left 01/05/2016    left small saphenous endovenous ablation with left leg microphlebectomy-Dr. Junita Push   ? HX MICROPHLEBECTOMY Right 07/06/2016    [place]   ? HX ENDOVENOUS ABLATION OF THE SMALL SAPHENOUS VEIN Right 07/06/2016    [facility]   ? ANGIOGRAPHY CORONARY ARTERY WITH LEFT HEART CATHETERIZATION N/A 04/02/2017    Performed by Nat Math, MD at [facility] CATH LAB   ? PERCUTANEOUS CORONARY STENT PLACEMENT WITH ANGIOPLASTY N/A 04/02/2017    Performed by Nat Math, MD at [facility] CATH LAB   ? PERCUTANEOUS CORONARY STENT PLACEMENT WITH ANGIOPLASTY N/A 06/29/2017    Performed by Nat Math, MD at [facility] CATH LAB   ? LEFT ULNAR NERVE DECOMPRESSION AT ELBOW Left 04/30/2018    Performed by Letitia Neri, MD at [facility] OR   ? BRONCHOSCOPY     ? CARDIAC CATHERIZATION  several years ago    [place]   ? DOPPLER ECHOCARDIOGRAPHY     ? HERNIA REPAIR     ? HX CHOLECYSTECTOMY     ? HX HEART CATHETERIZATION     ? HX LUMBAR LAMINECTOMY     ? HX PACEMAKER PLACEMENT         family history includes Cancer in his father and mother; Coronary Artery Disease in his father; Stroke in his sister.    Social History     Socioeconomic History   ? Marital status: Married     Spouse name: [name]   ? Number of children: 0   ? Years of education: Not on file   ? Highest education level: Not on file   Occupational History   ? Occupation: former Therapist, music: RETIRED     Comment: lives in a 80 year old house with wife   Tobacco Use   ? Smoking status: Former Smoker     Packs/day: 2.00     Years: 40.00     Pack years: 80.00     Types: Cigarettes     Quit date: 07/09/1998     Years since quitting: 21.5   ? Smokeless tobacco: Never Used   Vaping Use   ? Vaping Use: Never used   Substance and Sexual Activity   ? Alcohol use: Not Currently ? Drug use: Never   ? Sexual activity: Never   Other Topics Concern   ? Not on file   Social History Narrative   ? Not on file       Allergies  Allergen Reactions   ? Chlorhexidine BLISTERS and EDEMA       Vitals:    01/27/20 1447   BP: 135/89   BP Source: Arm, Left Upper   Patient Position: Sitting   Pulse: 84   Resp: 18   SpO2: 94%   Weight: 131.5 kg (290 lb)   Height: 180.3 cm (71)   PainSc: Nine       REVIEW OF SYSTEMS: 10 point ROS obtained and negative except as above.      PHYSICAL EXAM:  General: Alert, cooperative, no acute distress.  HEENT: Normocephalic, atraumatic.  Neck: Supple.  Lungs: Unlabored respirations, bilateral and equal chest excursion.  Heart: Regular rate.  Skin: Warm and dry to touch.  Abdomen: Nondistended.  MSK: TTP lumbar  Neurological: Alert and oriented x3.          IMPRESSION:    1. Lumbar spondylosis         PLAN: Other Bilateral L3-5 RFA      General Pre Procedural Sedation ASA Classification      I have discussed risks and alternatives of this type of sedation and procedure with: patient and significant other    NPO Status:Acceptable    Pregnancy Status: No    Prior Anesthetic Types: Anxiolysis    Patient's had previous experience with anesthesia and/or sedation complications: No    Family history of sedation complications: No    Airway: Airway assessment performed Mallampati  II (soft palate, uvula, fauces visible)    Head and Neck: No abnormalities noted    Mouth: No abnormalities noted    Medications for Procedural Sedation: Midazolam and Fentanyl    Anesthesia Classification:  ASA II (A normal patient with mild systemic disease)    Patient remains a candidate for procedure: Yes    The intention for the procedure today is Anxiolysis/Analgesia.

## 2020-01-27 NOTE — Progress Notes
Pain Procedure Plan Of Care    Risk of injury related to procedure  Patient identification, allergies verified, fall precautions implemented    Risk of injury and impaired skin integrity  Positioning devices applied as appropriate for procedure, patient transported with staff assistance    Management of Pain  Pain assessment completed on arrival, PAR scoring following procedure and at discharge, sedation administered as ordered, patient positioned for comfort    Risk of anxiety related to procedure and disease process  Patient education on procedure and expectations, provide coping support to patient, provide relaxation techniques    Outcomes:  The patient is free of injury during and following their procedure.  Skin is intact and free of bruising.  The patients pain is managed during their stay.  Alleviation of patient anxiety exhibited.

## 2020-01-27 NOTE — Procedures
Attending Surgeon: Joanie Coddington, MD    Anesthesia: Local    Pre-Procedure Diagnosis:   1. Lumbar spondylosis        Post-Procedure Diagnosis:   1. Lumbar spondylosis        DESTRUCTION OF NERVE W/ FLUORO LUMBAR/SACRAL    Laterality: bilateral (L4-5, L5-S1 facets)    Location: Lumbar/Sacral -  L3, L4 and L5      Consent:   Consent obtained: verbal and written  Consent given by: patient  Risks discussed: allergic reaction, bleeding, bruising, infection, nerve damage, no change or worsening in pain, sensation loss and skin burn    Discussed with patient the purpose of the treatment/procedure, other ways of treating my condition, including no treatment/ procedure and the risks and benefits of the alternatives. Patient has decided to proceed with treatment/procedure.        Universal Protocol:  Relevant documents: relevant documents present and verified  Test results: test results available and properly labeled  Imaging studies: imaging studies available  Required items: required blood products, implants, devices, and special equipment available  Site marked: the operative site was marked  Patient identity confirmed: Patient identify confirmed verbally with patient.        Time out: Immediately prior to procedure a time out was called to verify the correct patient, procedure, equipment, support staff and site/side marked as required      Procedures Details:   Indications: pain     Prep: chlorhexidine  Local anesthetic:  0.25% bupivacaine - 0.63mL and 0.5% bupivacaine - 0.75mL  Sedation: anxiolysis  Sedation Medications: Fentanyl 75 mcg (Versed 1 mg)  Patient position: prone  Estimated Blood Loss: minimal  Specimens: none  Number of Levels Ablated: 2  Guidance: fluoroscopy  Needle size: 18 G  Active Needle Tip Length: 10mm  Neurolytic Technique: Radiofrequency Ablation    Motor Testing: Motor testing complete per protocol   Injection procedure: Incremental injection and Negative aspiration for blood  Patient tolerance: Patient tolerated the procedure well with no immediate complications. Pressure was applied, and hemostasis was accomplished.  Comments:                   INTERVENTIONAL PAIN MANAGEMENT PROCEDURE REPORT    Radiofrequency Ablation (RFA) of Lumbar Medial Branch Nerves    Procedure Title(s):    1. Radiofrequency ablation of bilateral L3 - L5 medial branch nerves   2. Intraoperative fluoroscopy    Anesthesia: Local                       Anxiolysis Yes 75 mcg fentanyl, 1 mg midazolam           Procedural Sedation No    Indications: Ruben Wood. is a 79 y.o. male with a diagnosis of lumbar spondylosis. The patient's history and physical exam were reviewed. The patient has failed conservative measures including physical therapy and medication management. On exam the patient exhibits significant tenderness in the above stated levels which is exacerbated by extension and lateral flexion to the painful sides. The patient has had Two medial branch blocks with greater than 75% reduction in pain for the duration of the local anesthetic. The risks, benefits and alternatives to the procedure were discussed, and all questions were answered to the patient's satisfaction. The patient agreed to proceed, and written informed consent was obtained.     Procedure in Detail: IV was started? Yes    The patient was brought into the procedure room  and placed in the prone position on the fluoroscopy table. Standard monitors were placed, and vital signs were observed throughout the procedure. The area of the lumbar spine and upper buttocks were prepped with Hibaclens and draped in a sterile manner. AP fluoroscopy with oblique tilt to the right was used to identify and mark the junction between the superior articular process and transverse process at the L4-L5 levels. The right sacral ala was identified and marked. The skin and subcutaneous tissues in these identified areas were anesthetized with 1% lidocaine. A 18-gauge, 5 inch, 15 mm active tip radiofrequency probe was advanced toward each of these points under fluoroscopic guidance. Once bone was contacted, negative aspiration was confirmed. Motor stimulation at 2 Hz and 2 volts was negative. 0.5 mL of 1% lidocaine was injected prior to lesioning, which was performed for 90 seconds at 80 degrees centigrade.     The same procedure was then performed on the opposite side: Yes.    The probes were removed with a 1% lidocaine flush. The patient's back was cleaned, and bandages were placed at the needle insertion sites.    Disposition: The patient tolerated the procedure well, and there were no apparent complications. Vital signs remained stable throughout the procedure. The patient was taken to the recovery area where discharge instructions for the procedure were given.    Estimated Blood Loss: Minimal    Specimens: None    Complications: None              Estimated blood loss: none or minimal  Specimens: none  Patient tolerated the procedure well with no immediate complications. Pressure was applied, and hemostasis was accomplished.

## 2020-01-28 ENCOUNTER — Encounter: Admit: 2020-01-28 | Discharge: 2020-01-28 | Payer: MEDICARE

## 2020-01-28 ENCOUNTER — Ambulatory Visit: Admit: 2020-01-28 | Discharge: 2020-01-29 | Payer: MEDICARE

## 2020-01-28 DIAGNOSIS — L03116 Cellulitis of left lower limb: Secondary | ICD-10-CM

## 2020-01-28 DIAGNOSIS — K5909 Other constipation: Secondary | ICD-10-CM

## 2020-01-28 NOTE — Progress Notes
History of Present Illness  Ruben Wood. is a 79 y.o. 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  We have had him on higher doses of Bumex, he has been taking it 3 times daily.  With this, his weight has been coming down.  He is down about 10 pounds in total.  The swelling in his lower extremities is improved.  His creatinine has been trending down as well.  He does note that he has shortness of breath, but his recovery with activity has shortened.  He has not any chest pain or pressure heaviness.    We have also been treating him for cellulitis in the lower extremity on the left.  The redness has improved as has the warmth.  He denies any fevers or chills.    Lastly, he continues to struggle with constipation.  Is been over a week since he had a bowel movement.  He has tried MiraLAX, docusate, Metamucil, and suppositories.  He has not had any results yet.  We did get an x-ray done 2 days ago which not show any evidence of bowel obstruction.       Review of Systems  Per HPI    Objective:         ? acetaminophen (TYLENOL) 325 mg tablet Take two tablets by mouth every 6 hours as needed for Pain.   ? albuterol 0.083% (PROVENTIL) 2.5 mg /3 mL (0.083 %) nebulizer solution Inhale 3 mL solution by nebulizer as directed every 6 hours as needed for Wheezing or Shortness of Breath. Indications: asthma (Patient taking differently: Inhale 3 mL solution by nebulizer as directed three times daily as needed for Wheezing or Shortness of Breath. Indications: asthma)   ? albuterol sulfate (PROAIR HFA) 90 mcg/actuation aerosol inhaler Inhale two puffs by mouth into the lungs every 4 hours as needed for Wheezing or Shortness of Breath.   ? allopurinoL (ZYLOPRIM) 300 mg tablet Take 300 mg by mouth daily. Take with food.   ? amitriptyline/gabapentin/emu oil(#) (AMITRIPTYLINE/GABAPENTIN/EMU OIL(#)) 05/24/08 % Apply to affected area TID PRN   ? ASCORBIC ACID-ASCORBATE SODIUM PO Take 500 mg by mouth every morning.   ? aspirin EC 81 mg tablet Take one tablet by mouth daily. Take with food.   ? atorvastatin (LIPITOR) 40 mg tablet TAKE (1) TABLET BY MOUTH ONCE DAILY   ? benzonatate (TESSALON PERLES) 100 mg capsule Take one capsule by mouth every 8 hours.   ? budesonide-formoterol (SYMBICORT HFA) 160-4.5 mcg/actuation inhalation Inhale two puffs by mouth into the lungs twice daily. Rinse mouth after use.   ? bumetanide (BUMEX) 2 mg tablet Take one tablet by mouth twice daily.   ? cholecalciferol (VITAMIN D-3) 1,000 units tablet Take 1,000 Units by mouth daily.   ? duloxetine DR (CYMBALTA) 60 mg capsule Take one capsule by mouth daily.   ? empagliflozin (JARDIANCE) 25 mg tablet Take one tablet by mouth daily.   ? finasteride (PROSCAR) 5 mg tablet Take one tablet by mouth daily.   ? fish oil- omega 3-DHA/EPA 300/1,000 mg capsule Take 1 capsule by mouth daily.   ? gabapentin (NEURONTIN) 100 mg capsule Take one capsule by mouth every 8 hours.   ? ipratropium (ATROVENT) 0.03 % nasal spray Apply two sprays to each nostril as directed every 12 hours.   ? Miscellaneous Medical Supply misc Rx: Please wrap legs with ACE bandage from toes as high up  to the leg as possible bilaterally  Dx: Lymphedema   ? montelukast (SINGULAIR) 10 mg tablet Take one tablet by mouth at bedtime daily.   ? nitroglycerin (NITROSTAT) 0.4 mg tablet Place 0.4 mg under tongue every 5 minutes as needed for Chest Pain. Max of 3 tablets, call 911.   ? oxyCODONE (ROXICODONE) 5 mg tablet Take one tablet to two tablets by mouth every 8 hours as needed for Pain   ? pantoprazole DR (PROTONIX) 40 mg tablet TAKE (1) TABLET BY MOUTH TWO TIMES A DAY.   ? semaglutide (OZEMPIC) 0.25 mg or 0.5 mg(2 mg/1.5 mL) injection PEN Inject one-quarter mg under the skin every 7 days for 28 days, THEN one-half mg every 7 days for 62 days.   ? sodium chloride 3 % nebulizer solution Inhale 4 mL by mouth into the lungs twice daily. Use with [place].  Indications: asthma, chronic cough, bronchiectasis (Patient taking differently: Inhale 4 mL by mouth into the lungs three times daily. Use with [place].  Indications: asthma, chronic cough, bronchiectasis)   ? spironolactone (ALDACTONE) 25 mg tablet Take one tablet by mouth twice daily. Take with food.   ? tamsulosin (FLOMAX) 0.4 mg capsule TAKE (1) CAPSULE BY MOUTH ONCE DAILY.   ? zinc sulfate 220 mg (50 mg elemental zinc) capsule Take 220 mg by mouth daily.     There were no vitals filed for this visit.    There is no height or weight on file to calculate BMI.     Physical Exam  Constitutional:       Appearance: Normal appearance.   HENT:      Head: Normocephalic and atraumatic.   Pulmonary:      Effort: Pulmonary effort is normal.   Musculoskeletal:      Right lower leg: Edema present.      Left lower leg: Edema present.   Skin:     Findings: Erythema present.      Comments: Erythema of left lower extremity, liposclerosis present, no drainage. Per wife, Ruben Wood, slightly warmer than right to touch.    Neurological:      General: No focal deficit present.      Mental Status: He is alert.   Psychiatric:         Mood and Affect: Mood normal.         Labwork reviewed:  Lab Results   Component Value Date/Time    HGBA1C 5.9 11/25/2019 02:45 PM    HGBA1C 5.9 09/02/2019 09:50 AM    HGBA1C 6.2 (A) 04/28/2019 12:00 AM    HGBPOC 11.2 (L) 10/25/2012 10:58 AM    HGBPOC 12.9 (L) 10/25/2012 08:55 AM    MCALBR 7.6 04/29/2019 12:00 AM    TSH 1.79 11/25/2019 02:45 PM    FREET4R 0.99 12/15/2014 09:12 AM    CHOL 106 12/05/2018 01:57 AM    TRIG 108 12/05/2018 01:57 AM    HDL 31 (L) 12/05/2018 01:57 AM    LDL 60 12/05/2018 01:57 AM    NA 141 01/26/2020 12:00 AM    K 4.0 01/26/2020 12:00 AM    CL 100 01/26/2020 12:00 AM    CO2 30.0 01/26/2020 12:00 AM    GAP 8 12/03/2019 04:51 AM    BUN 24.0 (A) 01/26/2020 12:00 AM    CR 1.5 (A) 01/26/2020 12:00 AM    GLU 116 (A) 01/26/2020 12:00 AM    CA 8.9 01/26/2020 12:00 AM    PO4 3.2 12/21/2018 10:34 PM    ALBUMIN  3.7 11/25/2019 02:45 PM    TOTPROT 6.6 11/25/2019 02:45 PM    ALKPHOS 67 11/25/2019 02:45 PM    AST 19 11/25/2019 02:45 PM    ALT 16 11/25/2019 02:45 PM    TOTBILI 0.9 11/25/2019 02:45 PM    GFR 48 01/26/2020 12:00 AM    GFRAA >60 12/03/2019 04:51 AM    PSA 2.96 11/25/2018 12:00 AM              Assessment and Plan:    1. Acute on chronic combined systolic (congestive) and diastolic (congestive) heart failure (HCC)    2. Acute kidney injury superimposed on CKD (HCC)    3. Other constipation    4. Cellulitis of left lower extremity      1.  He is still acutely decompensated, but he is improving.  I want him to continue Bumex 3 times daily.  We will recheck labs on Friday, 01/30/2020.    2.  His creatinine level is improving.  This is secondary to decompensated heart failure.  We will repeat a BMP on 01/30/2020.    3.  For his constipation, I advised that he do a fleets enema today.  If not having any results, repeat this in 2 hours.    4.  His cellulitis is improving.  We will continue doxycycline for planned 7-day course.    Patient Instructions     Future Appointments   Date Time Provider Department Center   01/28/2020 11:00 AM Tarsha Blando, [name], MD [facility]   02/03/2020 11:00 AM Albakour, Alyssa C, APRN-NP [facility]   02/09/2020 12:00 AM [facility] Proced[facility]   03/11/2020 12:00 AM [facility] Proced[facility]   03/12/2020 11:20 AM Deveron Furlong, MD [facility]   03/16/2020 12:00 PM Arneta Cliche, MD [facility] Exam   03/19/2020  9:40 AM Hyman Bower A, DO [facility]   03/23/2020 11:40 AM Ranesha Val, [name], MD [facility]   04/12/2020 12:00 AM [facility] Proced[facility]   05/13/2020 12:00 AM [facility] Procedur   05/19/2020 11:15 AM [name], Lenice Llamas, PA-C [facility]   06/14/2020 12:00 AM [facility] Procedur       If you are on Mychart, you will now be able to read your clinic note from me.  My hope is that this will improve your engagement and insight into your health care and improve the accuracy of your medical record.  If you find inaccuracies, I apologize.  Please bring any concerns or inaccuracies to your next appointment.  Please remember that medical language and documentation is very different from how you and I speak and our dictation software is not perfect.    General Instructions:  ? How to reach me:   Please send a MyChart message to the General Medicine clinic or call Wynona Canes at [phone].    ? How to get a medication refill:  Please use the MyChart Refill request or contact your pharmacy directly to request medication refills. Please allow 48 hours.     ? How to receive your test results:  If you have signed up for MyChart, you will receive your test results and messages from me this way.  Otherwise, you will get a phone call or letter.   If you are expecting results and have not heard from my office within 2 weeks of your testing, please send a MyChart message or call  my office.     ? Scheduling:  Our Scheduling phone number is [phone].  Same Day appointments are usually available. Please ask for an annual or yearly physical appointment if it has been over 1 year since our last appointment.  ? Appointment Reminders on your cell phone: Make sure we have your cell phone number, and Text [place] to [phone].  ? Support groups for many chronic illnesses are available through [facility]: [facility] or [phone].             No follow-ups on file.

## 2020-01-28 NOTE — Patient Instructions
Future Appointments   Date Time Provider Department Center   01/28/2020 11:00 AM Brendolyn Stockley, [name], MD [facility]   02/03/2020 11:00 AM Albakour, Alyssa C, APRN-NP [facility]   02/09/2020 12:00 AM [facility] Proced[facility]   03/11/2020 12:00 AM [facility] Procedur   03/12/2020 11:20 AM Deveron Furlong, MD [facility]   03/16/2020 12:00 PM Arneta Cliche, MD [facility] Exam   03/19/2020  9:40 AM Hyman Bower A, DO [facility]   03/23/2020 11:40 AM Maika Mcelveen, [name], MD [facility]   04/12/2020 12:00 AM [facility] Procedur   05/13/2020 12:00 AM [facility] Procedur   05/19/2020 11:15 AM [name], Lenice Llamas, PA-C [facility]   06/14/2020 12:00 AM [facility] Procedur       If you are on Mychart, you will now be able to read your clinic note from me.  My hope is that this will improve your engagement and insight into your health care and improve the accuracy of your medical record.  If you find inaccuracies, I apologize.  Please bring any concerns or inaccuracies to your next appointment.  Please remember that medical language and documentation is very different from how you and I speak and our dictation software is not perfect.    General Instructions:  ? How to reach me:   Please send a MyChart message to the General Medicine clinic or call Wynona Canes at [phone].    ? How to get a medication refill:  Please use the MyChart Refill request or contact your pharmacy directly to request medication refills. Please allow 48 hours.     ? How to receive your test results:  If you have signed up for MyChart, you will receive your test results and messages from me this way.  Otherwise, you will get a phone call or letter.   If you are expecting results and have not heard from my office within 2 weeks of your testing, please send a MyChart message or call my office.     ? Scheduling:  Our Scheduling phone number is [phone].  Same Day appointments are usually available. Please ask for an annual or yearly physical appointment if it has been over 1 year since our last appointment.  ? Appointment Reminders on your cell phone: Make sure we have your cell phone number, and Text [place] to [phone].  ? Support groups for many chronic illnesses are available through [facility]: [email or web address] or [phone].

## 2020-01-28 NOTE — Telephone Encounter
Patients wife called to let Dr. Crecencio Mc know that patients trip to the bathroom was successful and believes the constipation has ended.  They wanted to apologize for ending the video visit abruptly, but patient needed to get to the bathroom.  They want to thank Dr. Crecencio Mc.    Routing to Dr. Crecencio Mc as Latanya Maudlin, RN

## 2020-01-28 NOTE — Telephone Encounter
That is great to hear!

## 2020-01-29 DIAGNOSIS — N179 Acute kidney failure, unspecified: Secondary | ICD-10-CM

## 2020-01-29 DIAGNOSIS — N189 Chronic kidney disease, unspecified: Secondary | ICD-10-CM

## 2020-01-29 DIAGNOSIS — I5043 Acute on chronic combined systolic (congestive) and diastolic (congestive) heart failure: Secondary | ICD-10-CM

## 2020-01-30 ENCOUNTER — Encounter: Admit: 2020-01-30 | Discharge: 2020-01-30 | Payer: MEDICARE

## 2020-01-30 DIAGNOSIS — N179 Acute kidney failure, unspecified: Secondary | ICD-10-CM

## 2020-01-30 DIAGNOSIS — I5043 Acute on chronic combined systolic (congestive) and diastolic (congestive) heart failure: Secondary | ICD-10-CM

## 2020-01-30 LAB — BASIC METABOLIC PANEL
Lab: 1.6 — AB (ref 0.8–1.3)
Lab: 102
Lab: 143
Lab: 25 — AB (ref 7–18)
Lab: 3.8
Lab: 31
Lab: 44
Lab: 8.9
Lab: 98

## 2020-01-30 NOTE — Telephone Encounter
Patient's wife called stating that [facility] need lab order re-sent to them as they couldn't find the order to draw lab work Dr. Crecencio Mc wanted.    Order reprinted and faxed again.    Patients wife states he's having increased cough and congestion and concerned on what to do for the weekend.    Routing to Dr. Crecencio Mc to advise  Etta Grandchild, RN

## 2020-01-30 NOTE — Telephone Encounter
I called patients wife to let her know order was re-sent and confirmation received of fax transmission.  She will bring patient back to get labs as Dr. Crecencio Mc needs this for further determination on current symptoms.    Etta Grandchild, RN

## 2020-01-30 NOTE — Telephone Encounter
I called and spoke with patients wife advising we have not yet got lab results, but will look for this on Monday.    Provided Dr. Lowella Bandy recommendations.    Etta Grandchild, RN

## 2020-01-30 NOTE — Telephone Encounter
I would like to see his labs first. If the cough gets worse over the weekend, I would recommend eval locally to rule infectious causes. Recently, we have been working more on volume.

## 2020-02-02 ENCOUNTER — Encounter: Admit: 2020-02-02 | Discharge: 2020-02-02 | Payer: MEDICARE

## 2020-02-02 MED ORDER — SODIUM CHLORIDE 3 % IN NEBU
4 mL | Freq: Two times a day (BID) | RESPIRATORY_TRACT | 11 refills | 30.00000 days | Status: AC
Start: 2020-02-02 — End: ?

## 2020-02-02 NOTE — Telephone Encounter
Patient's wife left voicemail requesting hypertonic saline neb refill to be sent to [facility].    Refill Request received.    Patient last seen 09/12/19 with plan to continue 3% saline therapy.    Follow up appt scheduled for 03/12/20.    Refilled per protocol.

## 2020-02-02 NOTE — Telephone Encounter
-----   Message from [name], MD sent at 02/02/2020  8:39 AM CST -----  Wynona Canes, please notify patient of results.  Thanks.      His labs are stable. Can you please see how he is feeling today?

## 2020-02-02 NOTE — Telephone Encounter
I called patient to discuss lab results.    States he's having respiratory issues right now with a lot of mucous production.  Notes coughing causing tightness and choking sensation.  His cough has been producing a lot of clear mucous and no fever.    Routing to Dr. Crecencio Mc to advise  Etta Grandchild, RN

## 2020-02-05 ENCOUNTER — Encounter: Admit: 2020-02-05 | Discharge: 2020-02-05 | Payer: MEDICARE

## 2020-02-05 ENCOUNTER — Ambulatory Visit: Admit: 2020-02-05 | Discharge: 2020-02-06 | Payer: MEDICARE

## 2020-02-05 DIAGNOSIS — J471 Bronchiectasis with (acute) exacerbation: Secondary | ICD-10-CM

## 2020-02-05 DIAGNOSIS — I5042 Chronic combined systolic (congestive) and diastolic (congestive) heart failure: Secondary | ICD-10-CM

## 2020-02-05 DIAGNOSIS — J4550 Severe persistent asthma, uncomplicated: Secondary | ICD-10-CM

## 2020-02-05 DIAGNOSIS — G629 Polyneuropathy, unspecified: Secondary | ICD-10-CM

## 2020-02-05 MED ORDER — SODIUM CHLORIDE 3 % IN NEBU
4 mL | Freq: Two times a day (BID) | RESPIRATORY_TRACT | 11 refills | 30.00000 days | Status: DC
Start: 2020-02-05 — End: 2020-02-05

## 2020-02-05 MED ORDER — LEVOFLOXACIN 750 MG PO TAB
750 mg | ORAL_TABLET | Freq: Every day | ORAL | 0 refills | 7.00000 days | Status: AC
Start: 2020-02-05 — End: ?

## 2020-02-05 MED ORDER — PREDNISONE 10 MG PO TAB
ORAL_TABLET | Freq: Every day | ORAL | 0 refills | Status: AC
Start: 2020-02-05 — End: ?
  Filled 2020-03-10: qty 20, 8d supply, fill #1

## 2020-02-05 MED ORDER — ALBUTEROL SULFATE 2.5 MG /3 ML (0.083 %) IN NEBU
3 mL | RESPIRATORY_TRACT | 11 refills | Status: AC | PRN
Start: 2020-02-05 — End: ?

## 2020-02-05 MED ORDER — IPRATROPIUM BROMIDE 21 MCG (0.03 %) NA SPRY
2 | Freq: Two times a day (BID) | NASAL | 3 refills | 25.00000 days | Status: AC
Start: 2020-02-05 — End: ?

## 2020-02-05 MED ORDER — AMITRIPTYLINE(#)/GABAPENTIN/EMU OIL 4/4/10%IN HRT TOPICAL CRM
Freq: Three times a day (TID) | 3 refills | 30.00000 days | Status: AC | PRN
Start: 2020-02-05 — End: ?
  Filled 2020-02-09: qty 4, 34d supply, fill #1

## 2020-02-05 MED ORDER — SODIUM CHLORIDE 3 % IN NEBU
4 mL | Freq: Two times a day (BID) | RESPIRATORY_TRACT | 11 refills | 30.00000 days | Status: AC
Start: 2020-02-05 — End: ?

## 2020-02-05 NOTE — Telephone Encounter
I called and scheduled telehealth visit today @ 1:20 pm.    Etta Grandchild, RN

## 2020-02-05 NOTE — Telephone Encounter
Ruben Wood, can they do a telehealth visit with me today at 1:20pm?  I need to try to differentiate HF vs asthma/bronchiectasis.
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
## 2020-02-05 NOTE — Telephone Encounter
[facility] called stating they do not have 4 ml inventory of sodium chloride nebulizer solution.  Requesting new order for to dispense 15 ml vials which are in stock quantity 900 ml for 30 day supply.  Patient would be instructed to draw up only 4 ml for nebulizer solution and needles and syringes would be supplied to do this.    Routing to Dr. Crecencio Mc to approve.  Order pended  Etta Grandchild, RN

## 2020-02-05 NOTE — Patient Instructions
Future Appointments   Date Time Provider Department Center   02/09/2020 12:00 AM [facility] CVM Procedur   03/11/2020 12:00 AM [facility]IT[facility] CVM Procedur   03/12/2020 11:20 AM Deveron Furlong, MD QVAPULM IM   03/16/2020 12:00 PM Arneta Cliche, MD M[facility]FC CVM Exam   03/19/2020  9:40 AM Hyman Bower A, DO [facility]ALL[facility]G IM   03/23/2020 11:40 AM Can Lucci, [name], MD MPGENMED IM   04/12/2020 12:00 AM [facility]OTE [facility]IT[facility]ING [facility]CREMOTEHRM CVM Procedur   05/13/2020 12:00 AM [facility]OTE [facility]IT[facility]ING [facility]CREMOTEHRM CVM Procedur   05/19/2020 11:15 AM [name], Lenice Llamas, PA-C [facility] Urology   06/14/2020 12:00 AM MAC REMOTE MONITORING MACREMOTEHRM CVM Procedur       If you are on Mychart, you will now be able to read your clinic note from me.  My hope is that this will improve your engagement and insight into your health care and improve the accuracy of your medical record.  If you find inaccuracies, I apologize.  Please bring any concerns or inaccuracies to your next appointment.  Please remember that medical language and documentation is very different from how you and I speak and our dictation software is not perfect.    General Instructions:  ? How to reach me:   Please send a MyChart message to the General Medicine clinic or call Wynona Canes at [phone].    ? How to get a medication refill:  Please use the MyChart Refill request or contact your pharmacy directly to request medication refills. Please allow 48 hours.     ? How to receive your test results:  If you have signed up for MyChart, you will receive your test results and messages from me this way.  Otherwise, you will get a phone call or letter.   If you are expecting results and have not heard from my office within 2 weeks of your testing, please send a MyChart message or call my office.     ? Scheduling:  Our Scheduling phone number is [phone].  Same Day appointments are usually available. Please ask for an annual or yearly physical appointment if it has been over 1 year since our last appointment.  ? Appointment Reminders on your cell phone: Make sure we have your cell phone number, and Text [place] to [phone].  ? Support groups for many chronic illnesses are available through [facility]: [email or web address] or [phone].

## 2020-02-06 ENCOUNTER — Encounter: Admit: 2020-02-06 | Discharge: 2020-02-06 | Payer: MEDICARE

## 2020-02-09 ENCOUNTER — Encounter: Admit: 2020-02-09 | Discharge: 2020-02-09 | Payer: MEDICARE

## 2020-02-09 NOTE — Telephone Encounter
Patient's wife called asking if there is anything we can do.  Patient has had a really rough day and not doing well.  States he had an asthma attack today and isn't improving.    Asking if another antibiotic could be prescribed or what could be done.    Routing to Dr. Crecencio Mc to advise  Etta Grandchild, RN

## 2020-02-09 NOTE — Telephone Encounter
I spoke with Dr. Crecencio Mc about this.  He advised patient to be seen in ED as he's tried many options and with no improvement he's concerned for patient.    I called and spoke with patients wife and patient advising ED.    Patient verbalizes understanding.    Etta Grandchild, RN

## 2020-02-13 ENCOUNTER — Encounter: Admit: 2020-02-13 | Discharge: 2020-02-13 | Payer: MEDICARE

## 2020-02-18 ENCOUNTER — Encounter: Admit: 2020-02-18 | Discharge: 2020-02-18 | Payer: MEDICARE

## 2020-02-18 DIAGNOSIS — L03116 Cellulitis of left lower limb: Secondary | ICD-10-CM

## 2020-02-18 MED ORDER — DOXYCYCLINE MONOHYDRATE 100 MG PO CAP
ORAL_CAPSULE | Freq: Every day | 1 refills | 10.00000 days | Status: AC
Start: 2020-02-18 — End: ?

## 2020-02-18 MED ORDER — DOXYCYCLINE HYCLATE 100 MG PO TAB
ORAL_TABLET | Freq: Two times a day (BID) | 0 refills
Start: 2020-02-18 — End: ?

## 2020-02-18 NOTE — Telephone Encounter
Pharmacy and paitent requesting:doxycline ordered by Dr. Barbette Or previously for daily maintenance dose   LR:not on med list  LOV: 02/05/2020  This is not a standing order. Routing to Dr. Crecencio Mc to approve.   Etta Grandchild, RN

## 2020-02-18 NOTE — Telephone Encounter
Pharmacy requesting:doxicycline  This was refused as therapy was completed  Etta Grandchild, RN

## 2020-02-24 ENCOUNTER — Encounter: Admit: 2020-02-24 | Discharge: 2020-02-24 | Payer: MEDICARE

## 2020-02-24 MED ORDER — NYSTATIN 100,000 UNIT/GRAM TP OINT
Freq: Two times a day (BID) | TOPICAL | 1 refills | 30.00000 days | Status: AC | PRN
Start: 2020-02-24 — End: ?
  Filled 2020-02-25: qty 30, 7d supply, fill #1

## 2020-02-24 NOTE — Telephone Encounter
I called patients wife and she would like this to stay with [facility] to be mailed to them due to issues with local pharmacy.    Etta Grandchild, RN

## 2020-02-24 NOTE — Telephone Encounter
Let's try some nystatin ointment next. I sent this to the [facility] pharmacy, but not sure if that is correct pharmacy.

## 2020-02-24 NOTE — Telephone Encounter
Patient's wife called stating she's been trying to treat patient's yeast infection to belly and private area with OTC medication, but it's not responding.    Asking if Dr. Crecencio Mc could prescribe something for him for this.    Routing to Dr. Crecencio Mc to advise  Etta Grandchild, RN

## 2020-02-25 ENCOUNTER — Encounter: Admit: 2020-02-25 | Discharge: 2020-02-25 | Payer: MEDICARE

## 2020-03-04 ENCOUNTER — Encounter: Admit: 2020-03-04 | Discharge: 2020-03-04 | Payer: MEDICARE

## 2020-03-04 NOTE — Telephone Encounter
Ruben Wood lvm saying that they would like the appointment tomorrow, 1/14 at 305p.     Future Appointments   Date Time Provider [place]   03/05/2020  3:05 PM Comfort, Corky Crafts, MD [facility] IM   03/11/2020 12:00 AM [facility]OTE [facility]IT[facility] CVM Procedur   03/12/2020 11:20 AM Silver Huguenin, MD QVAPULM IM   03/16/2020  9:40 AM Caprice Renshaw, MD [facility] SPINE   03/16/2020 12:00 PM Bartholomew Boards, MD [facility] CVM Exam   03/19/2020  9:40 AM Debby Freiberg A, DO [facility]ALLERG IM   03/23/2020 11:40 AM Comfort, Corky Crafts, MD [facility] IM   04/12/2020 12:00 AM [facility]OTE [facility]IT[facility]CREM[facility]HRM CVM Procedur   05/13/2020 12:00 AM [facility]OTE [facility]IT[facility]ING [facility]CREMOTEHRM CVM Procedur   05/19/2020 11:15 AM [name], Jeronimo Greaves, PA-C [facility] Urology   06/14/2020 12:00 AM [facility]OTE [facility]IT[facility]CR[facility] CVM Procedur     RN was unable to reach [name]. RN lvm letting them know that RN scheduled him for an in-person appointment with Dr. Gibson Ramp on 1/14.   Karen Kays, RN

## 2020-03-04 NOTE — Telephone Encounter
RN left detailed message for patient and wife so that they can get scheduled for an appointment tomorrow with Dr. Crecencio Mc.     Routing to [place], [employer] they leave a Neurosurgeon.     David Stall, RN

## 2020-03-04 NOTE — Telephone Encounter
Please schedule in one of my open procedure clinic slots tomorrow.

## 2020-03-04 NOTE — Telephone Encounter
[name] lvm saying that the patient is "wheezing and rattling around." Alafaya said that she would like to know Dr. Lowella Bandy thoughts.     Routing to Dr. Crecencio Mc.   David Stall, RN

## 2020-03-05 ENCOUNTER — Inpatient Hospital Stay: Admit: 2020-03-05 | Payer: MEDICARE

## 2020-03-05 ENCOUNTER — Ambulatory Visit: Admit: 2020-03-05 | Discharge: 2020-03-05 | Payer: MEDICARE

## 2020-03-05 ENCOUNTER — Encounter: Admit: 2020-03-05 | Discharge: 2020-03-05 | Payer: MEDICARE

## 2020-03-05 ENCOUNTER — Inpatient Hospital Stay: Admit: 2020-03-05 | Discharge: 2020-03-05 | Payer: MEDICARE

## 2020-03-05 DIAGNOSIS — J302 Other seasonal allergic rhinitis: Secondary | ICD-10-CM

## 2020-03-05 DIAGNOSIS — K219 Gastro-esophageal reflux disease without esophagitis: Secondary | ICD-10-CM

## 2020-03-05 DIAGNOSIS — J470 Bronchiectasis with acute lower respiratory infection: Secondary | ICD-10-CM

## 2020-03-05 DIAGNOSIS — J9621 Acute and chronic respiratory failure with hypoxia: Secondary | ICD-10-CM

## 2020-03-05 DIAGNOSIS — R609 Edema, unspecified: Secondary | ICD-10-CM

## 2020-03-05 DIAGNOSIS — I5042 Chronic combined systolic (congestive) and diastolic (congestive) heart failure: Secondary | ICD-10-CM

## 2020-03-05 DIAGNOSIS — M954 Acquired deformity of chest and rib: Secondary | ICD-10-CM

## 2020-03-05 DIAGNOSIS — I509 Heart failure, unspecified: Secondary | ICD-10-CM

## 2020-03-05 DIAGNOSIS — J189 Pneumonia, unspecified organism: Secondary | ICD-10-CM

## 2020-03-05 DIAGNOSIS — N183 CKD (chronic kidney disease) stage 3, GFR 30-59 ml/min (HCC): Secondary | ICD-10-CM

## 2020-03-05 DIAGNOSIS — N401 Enlarged prostate with lower urinary tract symptoms: Secondary | ICD-10-CM

## 2020-03-05 DIAGNOSIS — Z95828 Presence of other vascular implants and grafts: Secondary | ICD-10-CM

## 2020-03-05 DIAGNOSIS — R053 Chronic cough: Secondary | ICD-10-CM

## 2020-03-05 DIAGNOSIS — G4733 Obstructive sleep apnea (adult) (pediatric): Secondary | ICD-10-CM

## 2020-03-05 DIAGNOSIS — D801 Nonfamilial hypogammaglobulinemia: Secondary | ICD-10-CM

## 2020-03-05 DIAGNOSIS — J45901 Unspecified asthma with (acute) exacerbation: Secondary | ICD-10-CM

## 2020-03-05 DIAGNOSIS — R0600 Dyspnea, unspecified: Secondary | ICD-10-CM

## 2020-03-05 DIAGNOSIS — J479 Bronchiectasis, uncomplicated: Secondary | ICD-10-CM

## 2020-03-05 DIAGNOSIS — Z9981 Dependence on supplemental oxygen: Secondary | ICD-10-CM

## 2020-03-05 DIAGNOSIS — J45909 Unspecified asthma, uncomplicated: Secondary | ICD-10-CM

## 2020-03-05 DIAGNOSIS — J449 Chronic obstructive pulmonary disease, unspecified: Secondary | ICD-10-CM

## 2020-03-05 DIAGNOSIS — I714 Abdominal aortic aneurysm, without rupture: Secondary | ICD-10-CM

## 2020-03-05 DIAGNOSIS — E782 Mixed hyperlipidemia: Secondary | ICD-10-CM

## 2020-03-05 DIAGNOSIS — I2510 Atherosclerotic heart disease of native coronary artery without angina pectoris: Secondary | ICD-10-CM

## 2020-03-05 DIAGNOSIS — I10 Essential (primary) hypertension: Secondary | ICD-10-CM

## 2020-03-05 DIAGNOSIS — I429 Cardiomyopathy, unspecified: Secondary | ICD-10-CM

## 2020-03-05 DIAGNOSIS — Z8679 Personal history of other diseases of the circulatory system: Secondary | ICD-10-CM

## 2020-03-05 DIAGNOSIS — L03116 Cellulitis of left lower limb: Secondary | ICD-10-CM

## 2020-03-05 DIAGNOSIS — M961 Postlaminectomy syndrome, not elsewhere classified: Secondary | ICD-10-CM

## 2020-03-05 DIAGNOSIS — Z8614 Personal history of Methicillin resistant Staphylococcus aureus infection: Secondary | ICD-10-CM

## 2020-03-05 LAB — COMPREHENSIVE METABOLIC PANEL
Lab: 1.6 mg/dL — ABNORMAL HIGH (ref 0.3–1.2)
Lab: 1.6 mg/dL — ABNORMAL HIGH (ref 0.4–1.24)
Lab: 122 mg/dL — ABNORMAL HIGH (ref 70–100)
Lab: 137 MMOL/L (ref 137–147)
Lab: 14 U/L (ref 7–56)
Lab: 15 — ABNORMAL HIGH (ref 3–12)
Lab: 16 U/L (ref 7–40)
Lab: 23 MMOL/L (ref 21–30)
Lab: 33 mg/dL — ABNORMAL HIGH (ref 7–25)
Lab: 4.2 MMOL/L (ref 3.5–5.1)
Lab: 4.3 g/dL (ref 3.5–5.0)
Lab: 41 mL/min — ABNORMAL LOW (ref >60)
Lab: 57 U/L (ref 25–110)
Lab: 6.7 g/dL (ref 6.0–8.0)
Lab: 9.6 mg/dL (ref 8.5–10.6)
Lab: 99 MMOL/L (ref 98–110)

## 2020-03-05 LAB — CBC AND DIFF
Lab: 0 K/UL (ref 0–0.20)
Lab: 0.4 K/UL (ref 0–0.45)
Lab: 0.6 K/UL (ref 0–0.80)
Lab: 1 % (ref 0–2)
Lab: 1.4 K/UL (ref 1.0–4.8)
Lab: 101 FL — ABNORMAL HIGH (ref 80–100)
Lab: 11 % (ref 4–12)
Lab: 15 % — ABNORMAL HIGH (ref 11–15)
Lab: 23 % — ABNORMAL LOW (ref 24–44)
Lab: 240 K/UL (ref 150–400)
Lab: 3.5 K/UL (ref 1.8–7.0)
Lab: 34 g/dL (ref 32.0–36.0)
Lab: 34 pg — ABNORMAL HIGH (ref 26–34)
Lab: 58 % (ref 41–77)
Lab: 6.2 K/UL (ref 4.5–11.0)
Lab: 7 % — ABNORMAL HIGH (ref 0–5)
Lab: 7.6 FL (ref 7–11)

## 2020-03-05 LAB — RVP VIRAL PANEL PCR

## 2020-03-05 LAB — PROTIME INR (PT): Lab: 1 M/UL — ABNORMAL LOW (ref 0.8–1.2)

## 2020-03-05 LAB — SED RATE: Lab: 36 mm/h — ABNORMAL HIGH (ref 0–20)

## 2020-03-05 LAB — POC GLUCOSE: Lab: 154 mg/dL — ABNORMAL HIGH (ref 70–100)

## 2020-03-05 LAB — TSH WITH FREE T4 REFLEX: Lab: 2.4 uU/mL (ref 0.35–5.00)

## 2020-03-05 LAB — C REACTIVE PROTEIN (CRP): Lab: 0.9 mg/dL (ref <1.0)

## 2020-03-05 LAB — PROCALCITONIN: Lab: 0.4 ng/mL

## 2020-03-05 LAB — BNP (B-TYPE NATRIURETIC PEPTI): Lab: 80 pg/mL (ref 0–100)

## 2020-03-05 MED ORDER — BUMETANIDE 2 MG PO TAB
2 mg | Freq: Two times a day (BID) | ORAL | 0 refills | Status: AC
Start: 2020-03-05 — End: ?
  Administered 2020-03-06 – 2020-03-10 (×9): 2 mg via ORAL

## 2020-03-05 MED ORDER — POLYETHYLENE GLYCOL 3350 17 GRAM PO PWPK
1 | Freq: Every day | ORAL | 0 refills | Status: AC | PRN
Start: 2020-03-05 — End: ?
  Administered 2020-03-06 – 2020-03-08 (×3): 17 g via ORAL

## 2020-03-05 MED ORDER — ASPIRIN 81 MG PO TBEC
81 mg | Freq: Every day | ORAL | 0 refills | Status: AC
Start: 2020-03-05 — End: ?
  Administered 2020-03-06 – 2020-03-08 (×3): 81 mg via ORAL

## 2020-03-05 MED ORDER — ONDANSETRON 4 MG PO TBDI
4 mg | ORAL | 0 refills | Status: AC | PRN
Start: 2020-03-05 — End: ?

## 2020-03-05 MED ORDER — MONTELUKAST 10 MG PO TAB
10 mg | Freq: Every evening | ORAL | 0 refills | Status: AC
Start: 2020-03-05 — End: ?
  Administered 2020-03-06 – 2020-03-10 (×5): 10 mg via ORAL

## 2020-03-05 MED ORDER — FINASTERIDE 5 MG PO TAB
5 mg | Freq: Every day | ORAL | 0 refills | Status: AC
Start: 2020-03-05 — End: ?
  Administered 2020-03-06 – 2020-03-10 (×5): 5 mg via ORAL

## 2020-03-05 MED ORDER — ENOXAPARIN 40 MG/0.4 ML SC SYRG
40 mg | Freq: Every day | SUBCUTANEOUS | 0 refills | Status: AC
Start: 2020-03-05 — End: ?
  Administered 2020-03-06: 04:00:00 40 mg via SUBCUTANEOUS

## 2020-03-05 MED ORDER — ATORVASTATIN 40 MG PO TAB
40 mg | Freq: Every day | ORAL | 0 refills | Status: AC
Start: 2020-03-05 — End: ?
  Administered 2020-03-06 – 2020-03-10 (×4): 40 mg via ORAL

## 2020-03-05 MED ORDER — INSULIN ASPART 100 UNIT/ML SC FLEXPEN
0-6 [IU] | Freq: Before meals | SUBCUTANEOUS | 0 refills | Status: AC
Start: 2020-03-05 — End: ?
  Administered 2020-03-06: 15:00:00 1 [IU] via SUBCUTANEOUS

## 2020-03-05 MED ORDER — ALLOPURINOL 100 MG PO TAB
300 mg | Freq: Every day | ORAL | 0 refills | Status: AC
Start: 2020-03-05 — End: ?
  Administered 2020-03-06 – 2020-03-10 (×5): 300 mg via ORAL

## 2020-03-05 MED ORDER — GABAPENTIN 100 MG PO CAP
100 mg | ORAL | 0 refills | Status: AC
Start: 2020-03-05 — End: ?
  Administered 2020-03-06 – 2020-03-10 (×15): 100 mg via ORAL

## 2020-03-05 MED ORDER — BUDESONIDE-FORMOTEROL 160-4.5 MCG/ACTUATION IN HFAA
2 | Freq: Two times a day (BID) | RESPIRATORY_TRACT | 0 refills | Status: AC
Start: 2020-03-05 — End: ?
  Administered 2020-03-06: 04:00:00 2 via RESPIRATORY_TRACT

## 2020-03-05 MED ORDER — ACETAMINOPHEN 325 MG PO TAB
650 mg | ORAL | 0 refills | Status: AC | PRN
Start: 2020-03-05 — End: ?

## 2020-03-05 MED ORDER — SPIRONOLACTONE 50 MG PO TAB
25 mg | Freq: Two times a day (BID) | ORAL | 0 refills | Status: AC
Start: 2020-03-05 — End: ?
  Administered 2020-03-06 – 2020-03-10 (×10): 25 mg via ORAL

## 2020-03-05 MED ORDER — ONDANSETRON HCL (PF) 4 MG/2 ML IJ SOLN
4 mg | INTRAVENOUS | 0 refills | Status: AC | PRN
Start: 2020-03-05 — End: ?

## 2020-03-05 MED ORDER — MELATONIN 3 MG PO TAB
3 mg | Freq: Every evening | ORAL | 0 refills | Status: AC | PRN
Start: 2020-03-05 — End: ?
  Administered 2020-03-07 – 2020-03-10 (×4): 3 mg via ORAL

## 2020-03-05 MED ORDER — CEFEPIME 2G/100ML NS IVPB (MB+)
2 g | Freq: Two times a day (BID) | INTRAVENOUS | 0 refills | Status: AC
Start: 2020-03-05 — End: ?
  Administered 2020-03-06 – 2020-03-07 (×6): 2 g via INTRAVENOUS

## 2020-03-05 MED ORDER — DULOXETINE 60 MG PO CPDR
60 mg | Freq: Every day | ORAL | 0 refills | Status: AC
Start: 2020-03-05 — End: ?
  Administered 2020-03-06 – 2020-03-10 (×4): 60 mg via ORAL

## 2020-03-05 MED ORDER — BENZONATATE 100 MG PO CAP
100 mg | ORAL | 0 refills | Status: AC
Start: 2020-03-05 — End: ?
  Administered 2020-03-06 – 2020-03-10 (×14): 100 mg via ORAL

## 2020-03-05 MED ORDER — PREDNISONE 20 MG PO TAB
40 mg | Freq: Every day | ORAL | 0 refills | Status: AC
Start: 2020-03-05 — End: ?

## 2020-03-05 MED ORDER — METHYLPREDNISOLONE SOD SUC(PF) 125 MG/2 ML IJ SOLR
62.5 mg | Freq: Once | INTRAVENOUS | 0 refills | Status: CP
Start: 2020-03-05 — End: ?
  Administered 2020-03-06: 04:00:00 62.5 mg via INTRAVENOUS

## 2020-03-05 MED ORDER — TAMSULOSIN 0.4 MG PO CAP
.4 mg | Freq: Every day | ORAL | 0 refills | Status: AC
Start: 2020-03-05 — End: ?
  Administered 2020-03-07 – 2020-03-10 (×4): 0.4 mg via ORAL

## 2020-03-05 MED ORDER — ZINC SULFATE 50 MG ZINC (220 MG) PO CAP
220 mg | Freq: Every day | ORAL | 0 refills | Status: AC
Start: 2020-03-05 — End: ?
  Administered 2020-03-06 – 2020-03-10 (×5): 220 mg via ORAL

## 2020-03-05 MED ORDER — ALBUTEROL SULFATE 2.5 MG/0.5 ML IN NEBU
2.5 mg | RESPIRATORY_TRACT | 0 refills | Status: AC | PRN
Start: 2020-03-05 — End: ?
  Administered 2020-03-06 (×2): 2.5 mg via RESPIRATORY_TRACT

## 2020-03-05 MED ORDER — SODIUM CHLORIDE 3 % IN NEBU
4 mL | Freq: Two times a day (BID) | RESPIRATORY_TRACT | 0 refills | Status: AC
Start: 2020-03-05 — End: ?
  Administered 2020-03-06 – 2020-03-07 (×4): 4 mL via RESPIRATORY_TRACT

## 2020-03-05 MED ORDER — CHOLECALCIFEROL (VITAMIN D3) 25 MCG (1,000 UNIT) PO TAB
1000 [IU] | Freq: Every day | ORAL | 0 refills | Status: AC
Start: 2020-03-05 — End: ?
  Administered 2020-03-06 – 2020-03-10 (×5): 1000 [IU] via ORAL

## 2020-03-05 MED ORDER — PANTOPRAZOLE 40 MG PO TBEC
40 mg | Freq: Two times a day (BID) | ORAL | 0 refills | Status: AC
Start: 2020-03-05 — End: ?
  Administered 2020-03-06 – 2020-03-10 (×8): 40 mg via ORAL

## 2020-03-05 NOTE — Progress Notes
Pt admitted into room 7115. Pt refusing fall safety bundle. Attempted to put chair alarm on recliner and patient refused to let me. Sts he will be up frequently and isn't going to wait for someone to help. Educated patient on fall safety bundle's benefits and risks if not implicated, pt still refuses to comply

## 2020-03-05 NOTE — Progress Notes
History of Present Illness  Ruben Wood. is a 80 y.o. male.    Ruben Wood is here today with several days of increased shortness of breath coughing and wheezing.  He does have sputum production, but he describes this as clear and unchanged from his baseline.  He does have a history of asthma as well as bronchiectasis.  He has frequent flares of both of these.  He was recently seen in his local emergency department at [facility] where he was given prednisone as well as an antibiotic.  He does not member the name of that antibiotic.  He finished this about a week ago.  This helped his symptoms, but then they started to come right back.  He has been using his albuterol nebulizer and this helps, but for only a short time.  He has chronic lower extremity swelling.  He does have a history of heart failure.  He typically sleeps in the recliner.  His oxygen level has been lower than average.  It is not uncommon for him to have oxygen desaturations with activity, but his oxygen level at rest today was 90%.       Review of Systems  Per HPI    Objective:         ? acetaminophen (TYLENOL) 325 mg tablet Take two tablets by mouth every 6 hours as needed for Pain.   ? albuterol 0.083% (PROVENTIL) 2.5 mg /3 mL (0.083 %) nebulizer solution Inhale 3 mL solution by nebulizer as directed every 6 hours as needed for Wheezing or Shortness of Breath. Indications: asthma   ? albuterol sulfate (PROAIR HFA) 90 mcg/actuation aerosol inhaler Inhale two puffs by mouth into the lungs every 4 hours as needed for Wheezing or Shortness of Breath.   ? allopurinoL (ZYLOPRIM) 300 mg tablet Take 300 mg by mouth daily. Take with food.   ? AMITR/GABAPEN/EMU OIL 05/24/08% CREAM (COMPOUND) Apply topically to affected area three times daily as needed.   ? ASCORBIC ACID-ASCORBATE SODIUM PO Take 500 mg by mouth every morning.   ? aspirin EC 81 mg tablet Take one tablet by mouth daily. Take with food.   ? atorvastatin (LIPITOR) 40 mg tablet TAKE (1) TABLET BY MOUTH ONCE DAILY   ? benzonatate (TESSALON PERLES) 100 mg capsule Take one capsule by mouth every 8 hours.   ? budesonide-formoterol (SYMBICORT HFA) 160-4.5 mcg/actuation inhalation Inhale two puffs by mouth into the lungs twice daily. Rinse mouth after use.   ? bumetanide (BUMEX) 2 mg tablet Take one tablet by mouth twice daily.   ? cholecalciferol (VITAMIN D-3) 1,000 units tablet Take 1,000 Units by mouth daily.   ? doxycycline monohydrate (MONODOX) 100 mg capsule TAKE ONE CAPSULE BY MOUTH DAILY.   ? duloxetine DR (CYMBALTA) 60 mg capsule Take one capsule by mouth daily.   ? empagliflozin (JARDIANCE) 25 mg tablet Take one tablet by mouth daily.   ? finasteride (PROSCAR) 5 mg tablet Take one tablet by mouth daily.   ? fish oil- omega 3-DHA/EPA 300/1,000 mg capsule Take 1 capsule by mouth daily.   ? gabapentin (NEURONTIN) 100 mg capsule Take one capsule by mouth every 8 hours.   ? ipratropium bromide (ATROVENT) 21 mcg (0.03 %) nasal spray Apply two sprays to each nostril as directed every 12 hours.   ? Miscellaneous Medical Supply misc Rx: Please wrap legs with ACE bandage from toes as high up to the leg as possible bilaterally  Dx: Lymphedema   ? montelukast (SINGULAIR) 10  mg tablet Take one tablet by mouth at bedtime daily.   ? nitroglycerin (NITROSTAT) 0.4 mg tablet Place 0.4 mg under tongue every 5 minutes as needed for Chest Pain. Max of 3 tablets, call 911.   ? nystatin (MYCOSTATIN) 100,000 unit/g topical ointment Apply to affected areas twice daily as needed   ? oxyCODONE (ROXICODONE) 5 mg tablet Take one tablet to two tablets by mouth every 8 hours as needed for Pain   ? pantoprazole DR (PROTONIX) 40 mg tablet TAKE (1) TABLET BY MOUTH TWO TIMES A DAY.   ? semaglutide (OZEMPIC) 0.25 mg or 0.5 mg(2 mg/1.5 mL) injection PEN Inject one-quarter mg under the skin every 7 days for 28 days, THEN one-half mg every 7 days for 62 days.   ? sodium chloride (NEBUSAL) 3 % nebulizer solution Inhale 4 mL by mouth into the lungs twice daily. Please instruct patient to only use 4 ml daily. Use with [place].   ? spironolactone (ALDACTONE) 25 mg tablet Take one tablet by mouth twice daily. Take with food.   ? tamsulosin (FLOMAX) 0.4 mg capsule TAKE (1) CAPSULE BY MOUTH ONCE DAILY.   ? zinc sulfate 220 mg (50 mg elemental zinc) capsule Take 220 mg by mouth daily.     Vitals:    03/05/20 1435   BP: 127/62   Pulse: 83   SpO2: (!) 90%   Height: 180.3 cm (71)   PainSc: Nine     Body mass index is 40.45 kg/m?[name]     Wt Readings from Last 20 Encounters:   01/27/20 131.5 kg (290 lb)   01/06/20 136.1 kg (300 lb)   12/09/19 (!) 137.4 kg (303 lb)   12/09/19 (!) 137.4 kg (303 lb)   12/08/19 (!) 137.4 kg (303 lb)   12/03/19 (!) 137.6 kg (303 lb 6.4 oz)   11/25/19 (!) 138 kg (304 lb 3.2 oz)   11/20/19 (!) 137 kg (302 lb)   11/06/19 133.8 kg (295 lb)   10/21/19 132 kg (291 lb)   09/12/19 133.8 kg (295 lb)   09/02/19 133.8 kg (295 lb)   05/21/19 123.8 kg (273 lb)   05/16/19 123.8 kg (273 lb)   05/16/19 123.8 kg (273 lb)   03/14/19 127 kg (280 lb)   03/03/19 126.1 kg (278 lb)   01/31/19 132.9 kg (293 lb)   01/15/19 131.5 kg (290 lb)   01/03/19 129.3 kg (285 lb)         Physical Exam  Vitals and nursing note reviewed.   Constitutional:       General: He is not in acute distress.     Appearance: He is well-developed. He is not diaphoretic.   HENT:      Head: Normocephalic and atraumatic.   Neck:      Thyroid: No thyromegaly.      Vascular: No JVD.   Cardiovascular:      Rate and Rhythm: Normal rate and regular rhythm.      Heart sounds: Normal heart sounds. No murmur heard.   No friction rub. No gallop.    Pulmonary:      Effort: Pulmonary effort is normal. No respiratory distress.      Breath sounds: Wheezing and rhonchi present. No rales.      Comments: Accessory muscle use  Musculoskeletal:      Right lower leg: Edema present.      Left lower leg: Edema present.   Neurological:      Mental Status: He is  alert.         Labwork reviewed:  Lab Results   Component Value Date/Time    HGBA1C 5.9 11/25/2019 02:45 PM    HGBA1C 5.9 09/02/2019 09:50 AM    HGBA1C 6.2 (A) 04/28/2019 12:00 AM    HGBPOC 11.2 (L) 10/25/2012 10:58 AM    HGBPOC 12.9 (L) 10/25/2012 08:55 AM    MCALBR 7.6 04/29/2019 12:00 AM    TSH 1.79 11/25/2019 02:45 PM    FREET4R 0.99 12/15/2014 09:12 AM    CHOL 106 12/05/2018 01:57 AM    TRIG 108 12/05/2018 01:57 AM    HDL 31 (L) 12/05/2018 01:57 AM    LDL 60 12/05/2018 01:57 AM    NA 143 01/30/2020 12:00 AM    K 3.8 01/30/2020 12:00 AM    CL 102 01/30/2020 12:00 AM    CO2 31.8 01/30/2020 12:00 AM    GAP 8 12/03/2019 04:51 AM    BUN 25.0 (A) 01/30/2020 12:00 AM    CR 1.6 (A) 01/30/2020 12:00 AM    GLU 98 01/30/2020 12:00 AM    CA 8.9 01/30/2020 12:00 AM    PO4 3.2 12/21/2018 10:34 PM    ALBUMIN 3.7 11/25/2019 02:45 PM    TOTPROT 6.6 11/25/2019 02:45 PM    ALKPHOS 67 11/25/2019 02:45 PM    AST 19 11/25/2019 02:45 PM    ALT 16 11/25/2019 02:45 PM    TOTBILI 0.9 11/25/2019 02:45 PM    GFR 44 01/30/2020 12:00 AM    GFRAA >60 12/03/2019 04:51 AM    PSA 2.96 11/25/2018 12:00 AM              Assessment and Plan:    1. Severe asthma with exacerbation, unspecified whether persistent    2. Bronchiectasis with acute lower respiratory infection (HCC)    3. Acute on chronic respiratory failure with hypoxia (HCC)    4. Hypogammaglobulinemia (HCC)    5. Chronic combined systolic and diastolic congestive heart failure, NYHA class 2 (HCC)      He is here today with acute on chronic hypoxic respiratory failure most likely secondary to an asthma exacerbation.  He does have some mild accessory muscle use.  His oxygen did improve with 2 L of oxygen.  I am going to admit him as an inpatient for further work-up and treatment.  I have lower suspicion for heart failure at this time, but we certainly need to keep this in mind.    Total of 40 minutes were spent on the same day of the visit including preparing to see the patient, obtaining and/or reviewing separately obtained history, performing a medically appropriate examination and/or evaluation, counseling and educating the patient/family/caregiver, ordering medications, tests, or procedures, referring and communication with other health care professionals, documenting clinical information in the electronic or other health record, independently interpreting results and communicating results to the patient/family/caregiver, and care coordination.         There are no Patient Instructions on file for this visit.    No follow-ups on file.

## 2020-03-06 ENCOUNTER — Inpatient Hospital Stay: Admit: 2020-03-06 | Discharge: 2020-03-05 | Payer: MEDICARE

## 2020-03-06 MED ADMIN — SODIUM CHLORIDE 0.9 % IV SOLP [27838]: 250 mL | INTRAVENOUS | @ 04:00:00 | Stop: 2020-03-06 | NDC 00338004902

## 2020-03-06 MED ADMIN — PREDNISONE 20 MG PO TAB [6496]: 40 mg | ORAL | @ 16:00:00 | NDC 00054001820

## 2020-03-06 MED ADMIN — NYSTATIN 100,000 UNIT/GRAM TP POWD [39136]: TOPICAL | @ 19:00:00 | NDC 00832046515

## 2020-03-06 NOTE — H&P (View-Only)
Admission History and Physical Examination      Name:  Ruben Wood.                                             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 Lambert Mody from Pulmonary  - Takes doxycycline for antibiotic ppx  - Patient reports he uses 4 lpm of oxygen but only when he is feeling short of breath  - PTA inhalers: 3% NS BID, Symbicort BID, and PRN albuterol  - Prior sputum cultures reviewed, all negative or normal oropharyngeal flora  - Procal 0.43 on admission  - RVP negative on admission  - History and symptoms sound more like bronchiectasis exacerbation as opposed to asthma vs even less likely PNA or CHF  Plan:  > IV solumedrol 62.5 mg once followed by prednisone 40 mg daily  > Start Cefepime 2 g q12h for potential bronchiectasis flare  > Obtain 2-view CXR  > Consult Pulmonology  > Continue hypertonic saline nebs, Symbicort  > Albuterol q4h and PRN    Chronic combined heart failure  Ischemic cardiomyopathy  SSS s/p ICD  Atrial fibrillation  CAD  - Per Cardiology not on [facility] for Afib (although unclear why). Considering [facility] as an outpatient  - Most recent echocardiogram 11/26/19: EF of 35%, RV moderately dilated with normal function, moderate to severe MR  - Has CardioMEMs in place  - PTA regimen: Bumex 2 mg BID, Jardiance 25 mg daily, and aldactone 25 mg BID  - Does not appear fluid overloaded on exam on admission  Plan:  > Obtain BNP, CXR  > CardioMEMs reading in AM  > Continue PTA Bumex 2 mg BID, Aldactone 25 mg BID  > Continue PTA ASA 81 mg daily    CKD stage III  - Baseline Cr ~ 1.3 - 1.6  Plan:  > Monitor    DM II  - Most recent A1c was 5.9% on 11/25/19  Plan:  > Hold PTA Jardiance and Ozempic while inpatient  > LDCF as needed    OSA  > CPAP qhs    HTN  HLD  > Continue PTA atorvastatin 40 mg daily, bumex 2 mg BID, and aldactone 25 mg BID  AAA  -?10/2012 s/p endovascular repair   -?He follows with Dr. Chales Abrahams and was seen on?01/15/2019?with a CT of the abdomen the same day revealing aneurysmal sac size continues to decrease, now measuring 4.2?cm, previously 5 cm one year prior. Recommended repeat CT 2 years.    Morbid obesity  - BMI of 40.4      FEN: No IVF, replace lytes PRN, cardiac diet  Ppx: Lovenox  Code status: FULL  Dispo: Admit to medicine    Total time 70 minutes with > 50% of time spent counseling patient regarding their current clinical status including symptoms, vitals, labs, imaging, symptom management, medication management and side effects, and plan of care.    Leonia Reeves MD  Internal Medicine, Hospitalist  Reachable by [facility] or Team Pager  __________________________________________________________________________________  Primary Care Physician: Comfort, [name]  Verified    Chief Complaint:  They think my asthma is acting up  History of Present Illness: Ruben Wood. is a 80 y.o. male with a PMHx of HTN, HLD, DM II, CAD, combined diastolic and systolic heart failure, ICM, Afib, SSS s/p ICD, CKD, COPD, bronchiectasis, asthma, OSA, AAA, and morbid obesity who is directly admitted for possible asthma exacerbation.  Patient reports a several week history of worsening dyspnea, wheezing, and sputum production.  It is a little difficult to elicit a direct history from the patient as a lot of his dyspnea, wheezing, and sputum production are labile and chronic in nature.  He reports his sputum is white in color but very thick.  The amount is increased from his baseline sputum production.  He denies any hemoptysis.  He denies any recent fevers or chills.  He reports he was given steroids and antibiotics 2 weeks ago after he went to his local ER for similar symptoms.  He reports some improvement for several days while taking the medications however his symptoms returned and continued to worsen afterwards.  He denies any recent weight gain and in fact has lost some weight.  He reports he is now down to 290 pounds.  He denies any increased lower extremity edema.  He sleeps in a recliner so he is unsure if he has orthopnea.  He denies any PND.  He is prescribed oxygen however he does not wear it all the time at home.  He seems to only wear it when he is feeling short of breath.  He reports it is turned on 4 L/min.  He does not use oxygen at night with his CPAP.    Medical History:   Diagnosis Date   ? AAA (abdominal aortic aneurysm) (HCC) 10/15/2012   ? Asthma    ? BPH with obstruction/lower urinary tract symptoms    ? Bronchiectasis (HCC)    ? 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  09/12/12   Cath    ? Cardiomyopathy (HCC) 07/19/2014   ? Cellulitis of left lower extremity 11/12/2014    10/2014: admitted with cellulitis, [place] negative for venous clot, MRI without osteomyelitis.    11/12/2014 In clinic today, still with cellulitis.  Per  patient and his wife, the erythema may be extending.  Cultures from drainage in hospital with MSSA, but Blood Cx negative.  No e/o osteomyelitis.  My concern is that this antibiotic regimen is not adequately treating his cellulitis.  We will broaden coverage to get MRSA with doxycycline.  Patient and wife given strict call/return criteria.  I will see patient in 3-4 days for re-evaluation.  No fever today.  Plan:  Stop cefpodoxime and start doxycylcine  11/17/2014 Much better, no warmth and erythema minimal.   Plan: continue doxycyline for full 10 day course.      ? Chest wall deformity 07/09/2012    Chest wall reconstruction on 07/09/12    ? CHF (congestive heart failure) (HCC)    ? 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 Chronic cough 08/29/2012   ? 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 COPD (chronic obstructive pulmonary disease) (HCC) 08/13/2012   ? Dyspnea    ? Edema    ? GERD (gastroesophageal reflux disease) 08/13/2012   ? History of CHF (congestive heart failure) 08/13/2012   ? History of MRSA infection 10/14/2012   ? 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    ? Hypertension 08/13/2012   ? 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 Morbid obesity (HCC) 08/13/2012   ? On supplemental oxygen therapy    ? OSA on CPAP 08/13/2012   ? Pneumonia 04/2012    [facility]   ? Seasonal allergic reaction      Surgical History:   Procedure Laterality Date   ? LUNG SURGERY  2015    Bio bridge in left lung/rib cage was broken  from coughing   ? BACK SURGERY  Jan 2016   ? Right Heart Catheterization Right 11/06/2014    Performed by Cath, Physician at [facility] CATH LAB   ? CARDIAC DEFIBRILLATOR PLACEMENT  2017   ? Right Heart Catheterization With Insertion Pulmonary Artery Sensor Right 03/30/2015    Performed by Harley Alto, MD at [facility] CATH LAB   ? Insert CRT-D and Leads Right 11/25/2015    Performed by Deniece Ree, MD at [facility] EP LAB   ? Fluoroscopy N/A 11/25/2015    Performed by Deniece Ree, MD at [facility] EP LAB   ? Defibrillation Threshold Testing at ICD Implant N/A 11/25/2015    Performed by Deniece Ree, MD at [facility] EP LAB   ? VARICOSE VEIN SURGERY Left 01/05/2016    left small saphenous endovenous ablation with left leg microphlebectomy-Dr. Junita Push   ? HX MICROPHLEBECTOMY Right 07/06/2016    [facility]   ? HX ENDOVENOUS ABLATION OF THE SMALL SAPHENOUS VEIN Right 07/06/2016    [facility]   ? ANGIOGRAPHY CORONARY ARTERY WITH LEFT HEART CATHETERIZATION N/A 04/02/2017    Performed by Nat Math, MD at [facility] CATH LAB   ? PERCUTANEOUS CORONARY STENT PLACEMENT WITH ANGIOPLASTY N/A 04/02/2017    Performed by Nat Math, MD at [facility] CATH LAB   ?  PERCUTANEOUS CORONARY STENT PLACEMENT WITH ANGIOPLASTY N/A 06/29/2017    Performed by Nat Math, MD at [facility] CATH LAB   ? LEFT ULNAR NERVE DECOMPRESSION AT ELBOW Left 04/30/2018    Performed by Letitia Neri, MD at [facility] OR   ? BRONCHOSCOPY     ? CARDIAC CATHERIZATION  several years ago    [place]   ? DOPPLER ECHOCARDIOGRAPHY     ? HERNIA REPAIR     ? HX CHOLECYSTECTOMY     ? HX HEART CATHETERIZATION     ? HX LUMBAR LAMINECTOMY     ? HX PACEMAKER PLACEMENT       Family History   Problem Relation Age of Onset   ? Cancer Mother         liver   ? Stroke Sister    ? Cancer Father    ? Coronary Artery Disease Father      Social History     Socioeconomic History   ? Marital status: Married     Spouse name: [name]   ? Number of children: 0   ? Years of education: Not on file   ? Highest education level: Not on file   Occupational History   ? Occupation: former Therapist, music: RETIRED     Comment: lives in a 81 year old house with wife   Tobacco Use   ? Smoking status: Former Smoker     Packs/day: 2.00     Years: 40.00     Pack years: 80.00     Types: Cigarettes     Quit date: 07/09/1998     Years since quitting: 21.6   ? Smokeless tobacco: Never Used   Vaping Use   ? Vaping Use: Never used   Substance and Sexual Activity   ? Alcohol use: Not Currently   ? Drug use: Never   ? Sexual activity: Never   Other Topics Concern   ? Not on file   Social History Narrative   ? Not on file     Social Determinants of Health     Financial Resource Strain: Not on file   Food Insecurity: Not on file   Transportation Needs: Not on file   Physical Activity: Not on file   Stress: Not on file   Social Connections: Not on file   Intimate Partner Violence: Not on file      Vaping/E-liquid Use   ? Vaping Use Never User      Vaping/E-liquid Substances   ? CBD No    ? Nicotine No    ? Other No    ? Flavored No    ? THC No    ? Unknown No              Immunizations (includes history and patient reported):   Immunization History   Administered Date(s) Administered   ? COVID-19 (MODERNA), mRNA vacc, 100 mcg/0.5 mL (PF) 04/10/2019, 05/09/2019   ? FLU VACCINE >3YO (Preservative Free) 02/07/1999, 11/30/2008, 12/20/2010   ? Flu Vaccine =>65 YO High-Dose (PF) 11/09/2015, 11/13/2016, 12/18/2017   ? Flu Vaccine =>65 YO High-Dose Quadrivalent (PF) 12/03/2019   ? Flu Vaccine Quadrivalent Recombinant =>18 YO PF 02/07/2019   ? Flu Vaccine Trivalent >64 Yo High-dose (Preservative Free) 11/21/2013, 12/04/2014   ? Pneumococcal Vaccine (23-Val Adult) 02/19/2006, 09/11/2006, 04/27/2016   ? Pneumococcal Vaccine(13-Val Peds/immunocompromised adult) 05/11/2012, 12/04/2014   ? Varicella-Zoster Vaccine - live (ZOSTAVAX) 12/27/2012  Allergies:  Chlorhexidine    Medications:  Medications Prior to Admission   Medication Sig   ? acetaminophen (TYLENOL) 325 mg tablet Take two tablets by mouth every 6 hours as needed for Pain.   ? albuterol 0.083% (PROVENTIL) 2.5 mg /3 mL (0.083 %) nebulizer solution Inhale 3 mL solution by nebulizer as directed every 6 hours as needed for Wheezing or Shortness of Breath. Indications: asthma   ? albuterol sulfate (PROAIR HFA) 90 mcg/actuation aerosol inhaler Inhale two puffs by mouth into the lungs every 4 hours as needed for Wheezing or Shortness of Breath.   ? allopurinoL (ZYLOPRIM) 300 mg tablet Take 300 mg by mouth daily. Take with food.   ? AMITR/GABAPEN/EMU OIL 05/24/08% CREAM (COMPOUND) Apply topically to affected area three times daily as needed.   ? ASCORBIC ACID-ASCORBATE SODIUM PO Take 500 mg by mouth every morning.   ? aspirin EC 81 mg tablet Take one tablet by mouth daily. Take with food.   ? atorvastatin (LIPITOR) 40 mg tablet TAKE (1) TABLET BY MOUTH ONCE DAILY   ? benzonatate (TESSALON PERLES) 100 mg capsule Take one capsule by mouth every 8 hours.   ? budesonide-formoterol (SYMBICORT HFA) 160-4.5 mcg/actuation inhalation Inhale two puffs by mouth into the lungs twice daily. Rinse mouth after use.   ? bumetanide (BUMEX) 2 mg tablet Take one tablet by mouth twice daily.   ? cholecalciferol (VITAMIN D-3) 1,000 units tablet Take 1,000 Units by mouth daily.   ? doxycycline monohydrate (MONODOX) 100 mg capsule TAKE ONE CAPSULE BY MOUTH DAILY.   ? duloxetine DR (CYMBALTA) 60 mg capsule Take one capsule by mouth daily.   ? empagliflozin (JARDIANCE) 25 mg tablet Take one tablet by mouth daily.   ? finasteride (PROSCAR) 5 mg tablet Take one tablet by mouth daily.   ? fish oil- omega 3-DHA/EPA 300/1,000 mg capsule Take 1 capsule by mouth daily.   ? gabapentin (NEURONTIN) 100 mg capsule Take one capsule by mouth every 8 hours.   ? ipratropium bromide (ATROVENT) 21 mcg (0.03 %) nasal spray Apply two sprays to each nostril as directed every 12 hours.   ? Miscellaneous Medical Supply misc Rx: Please wrap legs with ACE bandage from toes as high up to the leg as possible bilaterally  Dx: Lymphedema   ? montelukast (SINGULAIR) 10 mg tablet Take one tablet by mouth at bedtime daily.   ? nitroglycerin (NITROSTAT) 0.4 mg tablet Place 0.4 mg under tongue every 5 minutes as needed for Chest Pain. Max of 3 tablets, call 911.   ? nystatin (MYCOSTATIN) 100,000 unit/g topical ointment Apply to affected areas twice daily as needed   ? oxyCODONE (ROXICODONE) 5 mg tablet Take one tablet to two tablets by mouth every 8 hours as needed for Pain   ? pantoprazole DR (PROTONIX) 40 mg tablet TAKE (1) TABLET BY MOUTH TWO TIMES A DAY.   ? semaglutide (OZEMPIC) 0.25 mg or 0.5 mg(2 mg/1.5 mL) injection PEN Inject one-quarter mg under the skin every 7 days for 28 days, THEN one-half mg every 7 days for 62 days.   ? sodium chloride (NEBUSAL) 3 % nebulizer solution Inhale 4 mL by mouth into the lungs twice daily. Please instruct patient to only use 4 ml daily. Use with [place].   ? spironolactone (ALDACTONE) 25 mg tablet Take one tablet by mouth twice daily. Take with food.   ? tamsulosin (FLOMAX) 0.4 mg capsule TAKE (1) CAPSULE BY MOUTH ONCE DAILY.   ? zinc sulfate 220  mg (50 mg elemental zinc) capsule Take 220 mg by mouth daily.     Review of Systems:  A 14 point review of systems was negative except for: Fatigue, dyspnea, cough, sputum production, wheezing, constipation, venous stasis    Physical Exam:  Vital Signs: Last Filed In 24 Hours Vital Signs: 24 Hour Range   BP: 127/62 (01/14 1435)  Pulse: 83 (01/14 1435)  SpO2: 90 % (01/14 1435)  Height: 180.3 cm (71) (01/14 1435) BP: (127)/(62)   Pulse:  [83]   SpO2:  [90 %]           General appearance: alert, well-developed, well-nourished, cooperative, no distress and morbidly obese  Head: Normocephalic, without obvious abnormality, atraumatic  Eyes: negative findings: conjunctivae and sclerae normal, corneas clear and pupils equal, round, reactive to light and accomodation  Neck: supple, symmetrical, trachea midline and unable to assess JVD d/t neck size  Lungs: End-expiratory wheezing noted throughout all lung fields. No crackles or wheezes  Heart: regular rate and rhythm, S1, S2 normal, no murmur, click, rub or gallop  Abdomen: Obese, soft, non-distended, non-tender, bowel sounds present  Extremities: Bilateral venous stasis dermatitis noted (L>R), mild pitting edema of LLE  Neurologic: Grossly normal  Peripheral pulses:  2+ and symmetric  Skin: Skin color, texture, turgor normal. No rashes or lesions    Lab/Radiology/Other Diagnostic Tests:  24-hour labs:    Results for orders placed or performed during the hospital encounter of 03/05/20 (from the past 24 hour(s))   RVP VIRAL PANEL PCR    Collection Time: 03/05/20  7:04 PM   Result Value Ref Range    Specimen Source Resp Panel       FLOCKED SWAB  NASOPHARYNGEAL  Use of this test on specimens other than nasopharyngeal swabs has not been   cleared by the [place] Food and Drug Administration. The performance characteristics   were determined by the [facility].      Adenovirus NOT DETECTED DN-NOT DETECTED    Coronavirus 229E NOT DETECTED DN-NOT DETECTED    Coronavirus HKU1 NOT DETECTED DN-NOT DETECTED    Coronavirus NL63 NOT DETECTED DN-NOT DETECTED    Coronavirus OC43 NOT DETECTED DN-NOT DETECTED    SARS-CoV-2 NOT DETECTED DN-NOT DETECTED    Human Metapneumovirus NOT DETECTED DN-NOT DETECTED    Human Rhinovirus/ENTEROVIRUS NOT DETECTED DN-NOT DETECTED    Influenza A H1N1 2009 NOT DETECTED DN-NOT DETECTED    Influenza A H1 NOT DETECTED DN-NOT DETECTED    Influenza A H3 NOT DETECTED DN-NOT DETECTED    Influenza B NOT DETECTED DN-NOT DETECTED Parainfluenza virus 1 NOT DETECTED DN-NOT DETECTED    Parainfluenza virus 2 NOT DETECTED DN-NOT DETECTED    Parainfluenza virus 3 NOT DETECTED DN-NOT DETECTED    Parainfluenza virus 4 NOT DETECTED DN-NOT DETECTED    RSV NOT DETECTED DN-NOT DETECTED    Bordetella parapertussis NOT DETECTED DN-NOT DETECTED    Bordetella pertussis NOT DETECTED DN-NOT DETECTED    Chlamydia pneumoniae NOT DETECTED DN-NOT DETECTED    Mycoplasma pneumoniae NOT DETECTED DN-NOT DETECTED   CBC AND DIFF    Collection Time: 03/05/20  8:40 PM   Result Value Ref Range    White Blood Cells 6.2 4.5 - 11.0 K/UL    RBC 4.00 (L) 4.4 - 5.5 M/UL    Hemoglobin 13.9 13.5 - 16.5 GM/DL    Hematocrit 45.4 40 - 50 %    MCV 101.6 (H) 80 - 100 FL    MCH 34.8 (  H) 26 - 34 PG    MCHC 34.3 32.0 - 36.0 G/DL    RDW 16.1 (H) 11 - 15 %    Platelet Count 240 150 - 400 K/UL    MPV 7.6 7 - 11 FL    Neutrophils 58 41 - 77 %    Lymphocytes 23 (L) 24 - 44 %    Monocytes 11 4 - 12 %    Eosinophils 7 (H) 0 - 5 %    Basophils 1 0 - 2 %    Absolute Neutrophil Count 3.58 1.8 - 7.0 K/UL    Absolute Lymph Count 1.41 1.0 - 4.8 K/UL    Absolute Monocyte Count 0.69 0 - 0.80 K/UL    Absolute Eosinophil Count 0.44 0 - 0.45 K/UL    Absolute Basophil Count 0.06 0 - 0.20 K/UL   PROTIME INR (PT)    Collection Time: 03/05/20  8:40 PM   Result Value Ref Range    INR 1.0 0.8 - 1.2   BNP (B-TYPE NATRIURETIC PEPTI)    Collection Time: 03/05/20  8:40 PM   Result Value Ref Range    B Type Natriuretic Peptide 80.0 0 - 100 PG/ML   TSH WITH FREE T4 REFLEX    Collection Time: 03/05/20  8:40 PM   Result Value Ref Range    TSH 2.49 0.35 - 5.00 MCU/ML   PROCALCITONIN    Collection Time: 03/05/20  8:40 PM   Result Value Ref Range    Procalcitonin 0.43 ng/mL   C REACTIVE PROTEIN (CRP)    Collection Time: 03/05/20  8:40 PM   Result Value Ref Range    C-Reactive Protein 0.93 <1.0 MG/DL   SED RATE    Collection Time: 03/05/20  8:40 PM   Result Value Ref Range    Sed Rate -ESR 36 (H) 0 - 20 MM/HR   POC GLUCOSE    Collection Time: 03/05/20  9:48 PM   Result Value Ref Range    Glucose, POC 154 (H) 70 - 100 MG/DL        Pertinent radiology reviewed. including recent radiologic studies    Raj Janus, MD

## 2020-03-06 NOTE — Progress Notes
Vascular Access Team consulted to obtain lab specimen.    Ultrasound Used: No    How Many Attempts: 1    Location of Unsuccessful Attempts: NA    At 2040 and 2042 approximately 35 ml/20 ml of blood obtained from RAC. Patient tolerated the procedure well. Specimen labeled and sent to lab.

## 2020-03-06 NOTE — Progress Notes
RT Adult Assessment Note    NAME:Ruben Wood.             MRN: [id number]             DOB:[birth year]          AGE: 80 y.o.  ADMISSION DATE: 03/05/2020             DAYS ADMITTED: LOS: 0 days    RT Treatment Plan:  Protocol Plan: Medications  Albuterol: Nebulizer PRN  Symbicort (Home regimen only): BID    Protocol Plan: Procedures  Vest Airway Clearance: BID  Oxygen/Humidity: O2 to keep SpO2 > 92%, if not on any RT modality, D/C protocol if greater than 24 hours on room air  SpO2: Continuous (Document SpO2 result Qshift)    Additional Comments:  Impressions of the patient: No distress  Intervention(s)/outcome(s):   Patient education that was completed:   Recommendations to the care team:     Vital Signs:  Pulse: 84  RR: 18 PER MINUTE  SpO2: 93 %  O2 Device: Cannula  Liter Flow: 2 Lpm  O2%:    Breath Sounds:    Respiratory Effort: Non-Labored

## 2020-03-07 MED ADMIN — AMOXICILLIN-POT CLAVULANATE 875-125 MG PO TAB [33228]: 875 mg | ORAL | @ 22:00:00 | NDC 00093227534

## 2020-03-07 MED ADMIN — METHYLPREDNISOLONE SOD SUC(PF) 125 MG/2 ML IJ SOLR [301233]: 62.5 mg | INTRAVENOUS | @ 16:00:00 | NDC 00009004725

## 2020-03-07 MED ADMIN — TRAZODONE 50 MG PO TAB [8085]: 50 mg | ORAL | @ 03:00:00 | NDC 00904686861

## 2020-03-07 MED ADMIN — PREDNISONE 20 MG PO TAB [6496]: 40 mg | ORAL | @ 02:00:00 | NDC 00054001820

## 2020-03-07 MED ADMIN — ALBUTEROL SULFATE 2.5 MG/0.5 ML IN NEBU [93139]: 2.5 mg | RESPIRATORY_TRACT | NDC 00487990130

## 2020-03-07 MED ADMIN — ENOXAPARIN 40 MG/0.4 ML SC SYRG [85052]: 40 mg | SUBCUTANEOUS | @ 16:00:00 | NDC 60505079200

## 2020-03-07 MED ADMIN — ALBUTEROL SULFATE 2.5 MG/0.5 ML IN NEBU [93139]: 2.5 mg | RESPIRATORY_TRACT | @ 12:00:00 | NDC 00487990130

## 2020-03-08 ENCOUNTER — Encounter: Admit: 2020-03-08 | Discharge: 2020-03-08 | Payer: MEDICARE

## 2020-03-08 MED ADMIN — AMOXICILLIN-POT CLAVULANATE 875-125 MG PO TAB [33228]: 875 mg | ORAL | @ 14:00:00 | NDC 00093227534

## 2020-03-08 MED ADMIN — ALBUTEROL SULFATE 2.5 MG/0.5 ML IN NEBU [93139]: 2.5 mg | RESPIRATORY_TRACT | @ 12:00:00 | NDC 00487990130

## 2020-03-08 MED ADMIN — MINERAL OIL-HYDROPHIL PETROLAT TP OINT [79011]: TOPICAL | @ 18:00:00 | NDC 61924018404

## 2020-03-08 MED ADMIN — ALBUTEROL SULFATE 2.5 MG/0.5 ML IN NEBU [93139]: 2.5 mg | RESPIRATORY_TRACT | @ 06:00:00 | NDC 00487990130

## 2020-03-08 MED ADMIN — TRAZODONE 50 MG PO TAB [8085]: 50 mg | ORAL | @ 06:00:00 | NDC 00904686861

## 2020-03-08 MED ADMIN — AMOXICILLIN-POT CLAVULANATE 875-125 MG PO TAB [33228]: 875 mg | ORAL | @ 23:00:00 | NDC 00093227534

## 2020-03-08 MED ADMIN — METHYLPREDNISOLONE SOD SUC(PF) 125 MG/2 ML IJ SOLR [301233]: 62.5 mg | INTRAVENOUS | @ 14:00:00 | NDC 00009004725

## 2020-03-08 MED ADMIN — ALBUTEROL SULFATE 2.5 MG/0.5 ML IN NEBU [93139]: 2.5 mg | RESPIRATORY_TRACT | NDC 00487990130

## 2020-03-09 ENCOUNTER — Encounter: Admit: 2020-03-09 | Discharge: 2020-03-09 | Payer: MEDICARE

## 2020-03-09 ENCOUNTER — Inpatient Hospital Stay: Admit: 2020-03-09 | Discharge: 2020-03-09 | Payer: MEDICARE

## 2020-03-09 MED ORDER — BARIUM SULFATE 98 % PO SUSR
130 mL | Freq: Once | ORAL | 0 refills | Status: CP
Start: 2020-03-09 — End: ?
  Administered 2020-03-09: 17:00:00 130 mL via ORAL

## 2020-03-09 MED ORDER — BARIUM SULFATE 40 % (W/V) PO SUSP
10 mL | Freq: Once | ORAL | 0 refills | Status: CP
Start: 2020-03-09 — End: ?
  Administered 2020-03-09: 17:00:00 10 mL via ORAL

## 2020-03-09 MED ORDER — BARIUM SULFATE 700 MG PO TAB
700 mg | Freq: Once | ORAL | 0 refills | Status: CP
Start: 2020-03-09 — End: ?
  Administered 2020-03-09: 17:00:00 700 mg via ORAL

## 2020-03-09 MED ORDER — SOD BICARB-CITRIC AC-SIMETH 2.21-1.53 GRAM/4 GRAM PO GREP
1 | Freq: Once | ORAL | 0 refills | Status: CP
Start: 2020-03-09 — End: ?

## 2020-03-09 MED ADMIN — TRAZODONE 50 MG PO TAB [8085]: 50 mg | ORAL | @ 06:00:00 | NDC 00904686861

## 2020-03-09 MED ADMIN — ALBUTEROL SULFATE 2.5 MG/0.5 ML IN NEBU [93139]: 2.5 mg | RESPIRATORY_TRACT | @ 11:00:00 | NDC 00487990130

## 2020-03-09 MED ADMIN — SENNOSIDES-DOCUSATE SODIUM 8.6-50 MG PO TAB [40926]: 1 | ORAL | @ 04:00:00 | NDC 70000052601

## 2020-03-09 MED ADMIN — LACTULOSE 10 GRAM/15 ML PO LIQUID GROUP [280001]: 20 g | ORAL | @ 17:00:00 | Stop: 2020-03-09 | NDC 00121115430

## 2020-03-09 MED ADMIN — AMOXICILLIN-POT CLAVULANATE 875-125 MG PO TAB [33228]: 875 mg | ORAL | @ 14:00:00 | NDC 00093227534

## 2020-03-09 MED ADMIN — METHYLPREDNISOLONE SOD SUC(PF) 125 MG/2 ML IJ SOLR [301233]: 62.5 mg | INTRAVENOUS | @ 14:00:00 | NDC 00009004725

## 2020-03-09 MED ADMIN — SENNOSIDES-DOCUSATE SODIUM 8.6-50 MG PO TAB [40926]: 1 | ORAL | @ 14:00:00 | NDC 70000052601

## 2020-03-09 MED ADMIN — POLYETHYLENE GLYCOL 3350 17 GRAM PO PWPK [25424]: 17 g | ORAL | @ 04:00:00 | NDC 00904693186

## 2020-03-09 NOTE — Telephone Encounter
Wife left VM message this AM that Ruben Wood would not be able to keep telehealth appointment today because he is In-Pt at [facility]. RN called back and left VM message that we got her message, that we are wishing Ruben Wood well, and will let Dr Katrinka Blazing know. RN noted that he is already rescheduled for telehealth 03/16/2020.

## 2020-03-10 ENCOUNTER — Encounter: Admit: 2020-03-10 | Discharge: 2020-03-10 | Payer: MEDICARE

## 2020-03-10 MED ADMIN — POLYETHYLENE GLYCOL 3350 17 GRAM PO PWPK [25424]: 17 g | ORAL | @ 03:00:00 | NDC 00904693186

## 2020-03-10 MED ADMIN — AMOXICILLIN-POT CLAVULANATE 875-125 MG PO TAB [33228]: 875 mg | ORAL | NDC 00093227534

## 2020-03-10 MED ADMIN — SENNOSIDES-DOCUSATE SODIUM 8.6-50 MG PO TAB [40926]: 1 | ORAL | @ 03:00:00 | NDC 00904672361

## 2020-03-10 MED ADMIN — POLYETHYLENE GLYCOL 3350 17 GRAM PO PWPK [25424]: 17 g | ORAL | @ 15:00:00 | Stop: 2020-03-11 | NDC 00904693186

## 2020-03-10 MED ADMIN — BUDESONIDE 0.25 MG/2 ML IN NBSP [81321]: 0.25 mg | RESPIRATORY_TRACT | @ 11:00:00 | Stop: 2020-03-11 | NDC 00487960101

## 2020-03-10 MED ADMIN — ALBUTEROL SULFATE 2.5 MG/0.5 ML IN NEBU [93139]: 2.5 mg | RESPIRATORY_TRACT | @ 12:00:00 | Stop: 2020-03-11 | NDC 00487990130

## 2020-03-10 MED ADMIN — AMOXICILLIN-POT CLAVULANATE 875-125 MG PO TAB [33228]: 875 mg | ORAL | @ 15:00:00 | Stop: 2020-03-11 | NDC 00093227534

## 2020-03-10 MED ADMIN — METHYLPREDNISOLONE SOD SUC(PF) 125 MG/2 ML IJ SOLR [301233]: 62.5 mg | INTRAVENOUS | @ 15:00:00 | Stop: 2020-03-11 | NDC 00009004725

## 2020-03-10 MED ADMIN — LACTULOSE 10 GRAM/15 ML PO LIQUID GROUP [280001]: 20 g | ORAL | @ 17:00:00 | Stop: 2020-03-10 | NDC 00121115430

## 2020-03-10 MED ADMIN — TRAZODONE 50 MG PO TAB [8085]: 50 mg | ORAL | @ 06:00:00 | Stop: 2020-03-11 | NDC 00904686861

## 2020-03-10 MED ADMIN — ARFORMOTEROL 15 MCG/2 ML IN NEBU [135894]: 15 ug | RESPIRATORY_TRACT | @ 12:00:00 | Stop: 2020-03-11 | NDC 70748025701

## 2020-03-10 MED FILL — BUDESONIDE 0.25 MG/2 ML IN NBSP: 0.25 mg/2 mL | RESPIRATORY_TRACT | 30 days supply | Qty: 120 | Fill #1 | Status: CP

## 2020-03-10 MED FILL — ARFORMOTEROL 15 MCG/2 ML IN NEBU: 15 mcg/2 mL | RESPIRATORY_TRACT | 30 days supply | Qty: 120 | Fill #1 | Status: CP

## 2020-03-10 MED FILL — AMOXICILLIN-POT CLAVULANATE 875-125 MG PO TAB: 875/125 mg | ORAL | 6 days supply | Qty: 12 | Fill #1 | Status: CP

## 2020-03-11 ENCOUNTER — Encounter: Admit: 2020-03-11 | Discharge: 2020-03-11 | Payer: MEDICARE

## 2020-03-11 NOTE — Telephone Encounter
Advised by Dr.Cedano pt needed appt for this Friday rescheduled as he is being discharged from the hospital today.    I moved appt to 1/25, overbooking to accommodate appt.    Left this information on VM.

## 2020-03-15 NOTE — Patient Instructions
Clinic Visit Summary:   Please contact Pulmonary Nurse Coordinator with signs and symptoms of worsening productive cough with thick secretions, blood in sputum, chest tightness/pain, shortness of breath, fever, chills, night sweats, or any questions or concerns.     Desire Evans Poppa, BSN,RN - Pulmonary RN Coordinator    Phone [phone]        Fax [phone]  For refills on medications, please have your pharmacy fax a refill authorization request form to our office at Fax) [phone]. Please allow at least 3 business days for refill requests.   For urgent issues after business hours/weekends/holidays call [phone] and request for the pulmonary fellow to be paged

## 2020-03-16 ENCOUNTER — Encounter: Admit: 2020-03-16 | Discharge: 2020-03-16 | Payer: MEDICARE

## 2020-03-16 NOTE — Progress Notes
Daily CardioMEMS Readings    Goal PA Diastolic: 14-15 mmHg   PA Diastolic Thresholds: 12-17 mmHg  No readings sent recently.

## 2020-03-18 NOTE — Progress Notes
Date of Service: 03/19/2020         Ruben Wood. Ruben Wood is a 80 y.o. male.    History of Present Illness    03-19-20 clinic visit post-hospitalization follow-up. Pt admitted to hospital 03-05-20 to 03-10-20 for an exacerbation of his lung disease. He was admitted due to increased SOB, cough and increased sputum production.  Prior to admission patient recently treated with p.o. antibiotics along with course of steroids with symptom improvement for about a week however symptoms recurred shortly after. Etiology for patient's presentation not quite clear.  On admission chest x-ray without signs of consolidation, sputum culture with no growth.  Was treated with a few days of IV cefepime however this switched over to Augmentin to complete a 10-day course.  Was also on IV Solu-Medrol and this will be tapered over 8-day course with subsequent decrease of prednisone every other day by 10 mg until complete.  Seen by pulmonary team and patient Symbicort which the patient was not taking consistently was discontinued, was started on nebulized Pulmicort and Brovana.  Patient also had esophagram which was largely unremarkable.  Was also seen by heart failure team with CardioMEMS parameters showing euvolemic state.      Today in clinic Ruben Wood notes continued stability of his breathing since being discharged. Reports less secretions in general. He feels like the nebulized therapies are working better than previous inhalers. Plan to continue nebulized budesonide and arformoterol. His wife reports mold infestation of crawl space and floor boards in home. This was recently discovered and is being remediated. This may be contributing to his sinus drainage and other symptoms.        Review of Systems  See above.        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 Notably he missed several days of diuretics while in an inpatient rehab facility the prior week. On admission febrile and requiring 60L O2/min 100% FiO2. ?He was treated with IV diuresis, ceftriaxone for a suspected UTI and vancomycin for suspected left lower extremity cellulitis. ?He was was started on a prolonged high dose dexamethasone taper due to concern for a post-covid fibroproliferative pulmonary disorder. ?He was stabilized and transferred to the floor 11/1.   ?  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 Pt with edema and possible CHF, however, still with hypoxia after adequate diuresis  ?  CT Chest on 11/2: Decrease in patchy, mid and lower lung predominant groundglass opacities within both lungs, with increasing areas of reticulation, traction bronchiectasis, and architectural distortion in the areas, compatible with developing fibrosis. Again, findings are likely related to reported prior COVID 19 infection. Mild cardiomegaly and at least moderate coronary artery calcification.   ?  Pt started on dexamethasone due to concern for fibrosis related to covid, transitioned to prednisone tomorrow per pulm recs. Pt will take 30mg  for total 7 days, then 20mg  until follow up with pulm as outpt.   ?  Pt satting well on 2-3 L oxygen at rest, 4L needed on ex ox with RT. CM to arrange home oxygen. Will need restart doxycycline on discharge. Pulm consult followed during admission and will follow as outpt.  Cont bipap QHS, albuterol and atrovent nebs, symbicort and hypertonic saline nebs. Cont PJP prophylaxis while on high dose steroid.   ?  2)ID- UTI, cellulitis  Pt treated with vancomycin and rocephin. Stopped on 11/5, will monitor off antibiotics for sx of recurrence. Once pt improved from respiratory status, could consider outpt evaluation by vascular surgery, will defer to PCP to arrange in future. ID followed during admission, appreciate assistance. Cont amitryptiline/gaba/emu oil for legs  ?  Art [place] Right leg: Interval development of moderate to moderately severe predominantly calcified plaque in the distal right superficial femoral artery with spectral findings compatible with 50-70% stenosis and minimal findings of arterial insufficiency distally; Left leg Tortuosity and collaterals in the mid superficial femoral artery with mild arterial insufficiency changes distally without visualization of a focal critical stenosis Interval development of abnormally low arterial resistance throughout the trifurcation and dorsalis pedis arteries with no focal stenosis identified. Findings could be secondary to hyperemia associated with wounds or other sources of infection. Mild superimposed arterial insufficiency could also contribute to these findings   ?  3)Cardio- ?coronary disease status post stenting, heart failure with reduced ejection fraction, ischemic cardiomyopathy status post AICD, AAA status post EVAR in 2014, hyperlipidemia  Pt aggressively diuresed with bumex. Daily CardioMEMs on 11/2 PAD is 12 mmHg; goal PAD 11-13 mmHg, reading on 11/4 was 5. Cont asa, fish oil, bumex 2mg  daily, sprinolactone 25mg  bid. CHF team following, appreciate their assistance  ?  4)Renal-CKD III, BPH  -cont finasteride and flomax  ?  5)FEN- cont vitamin B 12 injection for deficiency  ?  6)GI- cont PPI while on steroids  ?  CT Chest from 11-2:  CT Chest     Clinical Indication: ?Evaluate coated fibrosis     Technique: Multiple contiguous axial CT images were obtained through the   chest without IV contrast. Post processing coronal and sagittal   reconstruction images were made from the axial images.     IV contrast: None.     Comparison: CT chest 12/14/2018     Findings:     Evaluation of the mediastinum and hila, including the vasculature and for   lymphadenopathy, is limited without the use of IV contrast.     Axilla, Mediastinum and Hila: No axillary, mediastinal, or hilar   adenopathy.     Heart and Great Vessels: Heart is mildly enlarged without significant   pericardial effusion. Cardiac conduction device remains in place. At least   moderate coronary artery calcification. CardioMEMs device again seen   within the left lower lobe pulmonary artery.     Airway, Lungs and Pleura: Mild emphysema. Decrease in previously seen mid   and lower lung predominant groundglass opacities, with increasing   reticulation, traction bronchiectasis, and architectural distortion in   these areas. No new consolidation or groundglass opacity. No pleural   effusion.[name]     Upper Abdomen: Surgical clips in the gallbladder fossa. Partial   visualization of a left lateral abdominal wall hernia.     Chest Wall and Osseous Structures: Mild bilateral gynecomastia. Thoracic   spondylosis. Old rib fractures. No aggressive osseous lesions.     IMPRESSION     1. Decrease in patchy, mid and lower lung predominant groundglass   opacities within both lungs, with increasing areas of reticulation,   traction bronchiectasis, and architectural distortion in the areas,   compatible with developing fibrosis. Again, findings are likely related to   reported prior COVID 19 infection.   2. Mild cardiomegaly  and at least moderate coronary artery calcification.       ?Finalized by Francis Dowse, M.D. on 12/24/2018 7:58 AM. Dictated by Francis Dowse, M.D. on 12/24/2018 7:47 AM.   ?  Review of Systems  Baseline shortness of breath and marked fatigue.  Feels globally weak as well.  Remainder of 14 point ROS discussed and reported as negative.  ?  Results for NASHEED, PACIFICO ED (MRN [id number]) as of 03/09/2018 12:53  ? Ref. Range 05/21/2017 04:27 07/02/2017 19:00 08/28/2017 17:18   Absolute Eosinophil Count Latest Ref Range: 0 - 0.45 K/UL 0.10 0.10 0.40   ?  Results for NOX, DIGENOVA ED (MRN [id number]) as of 03/09/2018 12:53  ? Ref. Range 02/25/2016 16:10 09/14/2016 12:57 10/06/2016 12:21 02/28/2017 16:15 04/19/2017 11:45 05/05/2017 04:01 09/11/2017 13:56   C-ANCA Latest Units: TITER <20,NEGATIVE ? ? ? ? ? ?   P-ANCA Latest Units: TITER <20,NEGATIVE ? ? ? ? ? ?   Myeloperoxidase AB Unknown <0.2.Marland KitchenMarland Kitchen ? ? ? ? ? ?   Serine Protease3 AB Unknown <0.2.Marland KitchenMarland Kitchen ? ? ? ? ? ?   Complement CH50 Latest Ref Range: 41 - 95 U/mL 67 ? ? ? ? ? ?   IgG Latest Ref Range: 762 - 1,488 MG/DL 956 (L) 213 (L) ? 531 (L) 472 (L) 492 (L) 737 (L)   IgM Latest Ref Range: 38 - 328 MG/DL 086 ? ? 578 ? ? 469   IgE Latest Ref Range: <165 IU/ML 17 ? 31 ? ? ? ?   IgA Latest Ref Range: 70 - 390 MG/DL 629 ? ? 528 ? ? 413   ?  ?  03-02-2018:  CT Chest:  IMPRESSION    1. ?Mild emphysema with scattered areas of scarring. Mild lower lobe   bronchiectasis with areas of peribronchial thickening and mild patchy and   reticular opacities, greater on the left, which may be from chronic   scarring and/or pneumonitis possibly from aspiration. No superimposed   consolidation or pleural effusion.    2. Mild cardiomegaly. Coronary artery and aortic valve calcifications.    3. No thoracic lymphadenopathy.      ?Finalized by Ancil Boozer, M.D. on 03/02/2018 11:19 AM. Dictated by Ancil Boozer, M.D. on 03/02/2018 11:05 AM.  ?  ECHO 12-2017:  Interpretation Summary   ?  ?  ? Technically difficult study with poor endocardial visualization.  ? Wall motion abnormality as below is similar to prior echo.  ? Valves not seen but no doppler evidence of significant valvular disease.  ?  ? LV is moderately dilated by volume criteria.   ? Reduced left ventricular systolic function with EF = 45%.  ? Estimated pulmonary artery systolic pressure is 40?mmHg.  ? In comparison to prior study dated 05/03/17, no significant changes are seen. ?  ?   ?  Nuclear Report  ?  [facility]???????????????????????????  Divison of Nuclear Cardiac Imaging  Consultation Report  ?  EXAMINATION:?56m?Technetium-Pyrophosphate imaging for transthyretin cardiac amyloidosis.  ?  Date of Study: ?02/27/18  Study #: ?[id number]  [place] MRN: ?[id number]  [place] Billing ID: ?[id number]  Referring Physician: ?Vena Austria, MD  Requested by: ?Lamont Snowball, MD  BMI: 38.5?kg/m2  ?  INDICATIONS FOR STUDY (HISTORY):?This is a 80 year old gentleman being evaluated for moderate concentric left ventricular hypertrophy and reduced global left ventricular ejection fraction.  ?  PROCEDURAL DETAILS:?Two hours after weight-adjusted intravenous injection of 21.8?mCi of 56m?Technetium-Pyrophosphate, Planar?and  SPECT?images were obtained. The exam was performed supine with low energy-high resolution collimators adjusting acquisitions of 750,000 counts. Heart-contralateral lung (H/CL) ratio was calculated at the two hour acquisition. ?Comparisons of heart uptake to rib uptake were made, and then graded on a scale of 0-3.  ?  FINDINGS:   2 hours post tracer injection there is normal rib and bone uptake but no myocardial uptake of tracer.?  ?  SCINTIGRAPIC (Planar/SPECT):  Both planar and SPECT images are negative for myocardial uptake of tracer.  ?  Two hour heart/contralateral lung (H/CL) ratio: ?1.115?this metric is negative and not suggestive of ATTR cardiac amyloidosis and falls within the low equivocal range.  ?  ?  Visual Semi-Quantitative Grading Scale Analysis:?  This study is grade 0 and not suggestive of ATTR cardiac amyloidosis.  ?  SUMMARY/OPINION:   This study is negative and not suggestive of ATTR cardiac amyloidosis. ?The low equivocal heart contralateral lung ratio is likely due to variable attenuation and slight residual blood pool activity. ?The negative study does not exclude AL cardiac amyloidosis    Objective:         ? acetaminophen (TYLENOL) 325 mg tablet Take two tablets by mouth every 6 hours as needed for Pain.   ? albuterol 0.083% (PROVENTIL) 2.5 mg /3 mL (0.083 %) nebulizer solution Inhale 3 mL solution by nebulizer as directed every 6 hours as needed for Wheezing or Shortness of Breath. Indications: asthma   ? albuterol sulfate (PROAIR HFA) 90 mcg/actuation aerosol inhaler Inhale two puffs by mouth into the lungs every 4 hours as needed for Wheezing or Shortness of Breath.   ? allopurinoL (ZYLOPRIM) 300 mg tablet Take 300 mg by mouth daily. Take with food.   ? AMITR/GABAPEN/EMU OIL 05/24/08% CREAM (COMPOUND) Apply topically to affected area three times daily as needed.   ? arformoteroL (BROVANA) 15 mcg/2 mL nebulizer solution Inhale 2 mL solution by nebulizer as directed twice daily. Dx: J44.9   ? ASCORBIC ACID-ASCORBATE SODIUM PO Take 500 mg by mouth every morning.   ? aspirin EC 81 mg tablet Take one tablet by mouth daily. Take with food.   ? atorvastatin (LIPITOR) 40 mg tablet TAKE (1) TABLET BY MOUTH ONCE DAILY   ? benzonatate (TESSALON PERLES) 100 mg capsule Take one capsule by mouth every 8 hours.   ? budesonide (PULMICORT) 0.25 mg/2 mL nebulizer solution Inhale 2 mL solution by nebulizer as directed twice daily. Dx: J44.9   ? bumetanide (BUMEX) 2 mg tablet Take one tablet by mouth twice daily.   ? cholecalciferol (VITAMIN D-3) 1,000 units tablet Take 1,000 Units by mouth daily.   ? duloxetine DR (CYMBALTA) 60 mg capsule Take one capsule by mouth daily.   ? empagliflozin (JARDIANCE) 25 mg tablet Take one tablet by mouth daily.   ? finasteride (PROSCAR) 5 mg tablet Take one tablet by mouth daily.   ? fish oil- omega 3-DHA/EPA 300/1,000 mg capsule Take 1 capsule by mouth daily.   ? gabapentin (NEURONTIN) 100 mg capsule Take one capsule by mouth every 8 hours.   ? ipratropium bromide (ATROVENT) 21 mcg (0.03 %) nasal spray Apply two sprays to each nostril as directed every 12 hours.   ? Miscellaneous Medical Supply misc Rx: Please wrap legs with ACE bandage from toes as high up to the leg as possible bilaterally  Dx: Lymphedema   ? montelukast (SINGULAIR) 10 mg tablet Take one tablet by mouth at bedtime daily.   ? nitroglycerin (NITROSTAT) 0.4 mg tablet  Place 0.4 mg under tongue every 5 minutes as needed for Chest Pain. Max of 3 tablets, call 911.   ? nystatin (MYCOSTATIN) 100,000 unit/g topical ointment Apply to affected areas twice daily as needed   ? oxyCODONE (ROXICODONE) 5 mg tablet Take one tablet to two tablets by mouth every 8 hours as needed for Pain   ? pantoprazole DR (PROTONIX) 40 mg tablet TAKE (1) TABLET BY MOUTH TWO TIMES A DAY.   ? predniSONE (DELTASONE) 10 mg tablet Take four tablets by mouth daily for 2 days, THEN three tablets daily for 2 days, THEN two tablets daily for 2 days, THEN one tablet daily for 2 days.   ? spironolactone (ALDACTONE) 25 mg tablet Take one tablet by mouth twice daily. Take with food.   ? tamsulosin (FLOMAX) 0.4 mg capsule TAKE (1) CAPSULE BY MOUTH ONCE DAILY.   ? zinc sulfate 220 mg (50 mg elemental zinc) capsule Take 220 mg by mouth daily.     There were no vitals filed for this visit.  There is no height or weight on file to calculate BMI.         Physical Exam  Vitals reviewed.   Constitutional:       General: He is not in acute distress.     Appearance: He is well-developed. He is not diaphoretic.      Comments: Vitals reviewed.  Pt alert and oriented times 3 in NAD. Breathing comfortably in clinic.   HENT:      Head: Normocephalic and atraumatic.      Right Ear: External ear normal.      Left Ear: External ear normal.      Nose: Nose normal.      Mouth/Throat:      Pharynx: No oropharyngeal exudate.   Eyes:      General:         Right eye: No discharge.         Left eye: No discharge.      Conjunctiva/sclera: Conjunctivae normal.      Pupils: Pupils are equal, round, and reactive to light.   Neck:      Thyroid: No thyromegaly.      Trachea: No tracheal deviation.   Cardiovascular:      Rate and Rhythm: Normal rate and regular rhythm.      Heart sounds: Normal heart sounds. No murmur heard.   No friction rub. No gallop.    Pulmonary:      Effort: Pulmonary effort is normal. No respiratory distress.      Breath sounds: Normal breath sounds. No stridor. No wheezing or rales.   Chest:      Chest wall: No tenderness.   Abdominal:      General: Bowel sounds are normal.      Palpations: Abdomen is soft.   Musculoskeletal:         General: No tenderness or deformity. Normal range of motion.      Cervical back: Normal range of motion and neck supple.   Lymphadenopathy:      Cervical: No cervical adenopathy.   Skin:     General: Skin is warm and dry.      Coloration: Skin is not pale.      Findings: No erythema or rash.   Neurological:      Mental Status: He is alert and oriented to person, place, and time.   Psychiatric:         Behavior: Behavior normal.  Thought Content: Thought content normal.         Judgment: Judgment normal.            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    Recent exacerbation treated with antibiotics and prednisone.  Levaquin seemed to help the most.  Will remember this for future bronchitis symptoms.  Offered pt to contact our office with pulmonary symptoms.   -- Continued inhaled therapies with nebulized budesonide and arformoterol. Prn albuterol nebs. Encouraged prn use of albuterol nebs now that he is on arformoterol as to not overuse beta agonists.  -- Mold remediation taking place. We will see if his symptoms remain under better control once potential trigger has been removed from environment.    ??  OSA  -- Continue current CPAP settings.  -- No complaints  ?  Allergic Asthma  - Hospitalized January 2021 for ? Asthma exacerbation.  - Acute Bronchitis July 2021.  Treated at home.  No admission.  - COVID infection in October 2020  - Hospitalized 02-2018 for decompensated heart failure, cellulitis and possible pneumonia.  - Hospitalized 04-20-2017 for combination of acute on chronic CHF exacerbation with probable asthma component as well.  - Hospitalized again in May 2019 for decompensated heart failure and probable asthma component as well.  - Hospitalized September 25th s/p fall with shoulder dislocation. ?Developed MRSA pneumonia while inpatient and at rehab.  - Asthma/Bronchiectasis is currently stable on therapy.??Difficult to ascertain asthma from CHF at times.??  - Continue Brovana and Pulmicort with  Montelukast. Prn use of albuterol nebs.  - History of eosinophilia.  ?  Results for BURDETTE, [place] (MRN [id number]) as of 10/03/2017 09:23  ? Ref. Range 08/28/2017 17:18   Eosinophils Latest Ref Range: 0 - 5 % 5   Absolute Eosinophil Count Latest Ref Range: 0 - 0.45 K/UL 0.40   ?  ?  Allergic Rhinitis  - Fall worst time of year  - Some increased sinus drainage. ?Mucus cough controlled with use of mucinex.  ?????????????>?Continue Singulair  ?????????????>?Remainder per Allergy  ?  Chronic sinusitis:?unchanged  - Stable  ?  Bronchiectasis  -?Proven RML bronchiectasis on previous outside CT scan  -?Continue aggressive pulmonary hygeine with flutter valve as needed.??Doing much better with addition of VEST and 3% saline therapy. ?Continue.  - Cough?control is better.  ?  Concern for aspiration:?swallow study negative previously.  ?  GERD:?Continue PPI per GI?(stable)  ?  S/p lung herniation with bio-bridge repair.  - Follow-up outpatient with Dr. Seth Bake. ?Likely not repeat surgical candidate.  ?  AAA s/p endovascular repair:?Follow up with Dr. Chales Abrahams as scheduled  ?  CAD/CHF: currently compensated  -- ?Pt was doing well  -- ?Re-initiate cardiac rehab once he has recovered from his injuries.   ?  Morbid obesity  - Reports some weight gain with prednisone use  ?????????????>?Encouraged continued activity and attempts at weight loss  ?  BPH:?Previously referred to urology  ?  Chronic respiratory failure with hypoxia:  -- Pt requires supplemental oxygen with exertion and at rest prn.  ?    ?  ?  RTC?in 6 months.  Flu vaccine?UTD.  UTD on pneumonia vaccines  Call with changes in symptoms or worsening lung issues.  COVID vaccinated.  30 minutes spent on this patient's encounter with counseling and coordination of care taking >50% of the visit.

## 2020-03-19 ENCOUNTER — Encounter: Admit: 2020-03-19 | Discharge: 2020-03-19 | Payer: MEDICARE

## 2020-03-19 ENCOUNTER — Ambulatory Visit: Admit: 2020-03-19 | Discharge: 2020-03-20 | Payer: MEDICARE

## 2020-03-19 DIAGNOSIS — N401 Enlarged prostate with lower urinary tract symptoms: Secondary | ICD-10-CM

## 2020-03-19 DIAGNOSIS — I5042 Chronic combined systolic (congestive) and diastolic (congestive) heart failure: Secondary | ICD-10-CM

## 2020-03-19 DIAGNOSIS — J471 Bronchiectasis with (acute) exacerbation: Secondary | ICD-10-CM

## 2020-03-19 DIAGNOSIS — J189 Pneumonia, unspecified organism: Secondary | ICD-10-CM

## 2020-03-19 DIAGNOSIS — I509 Heart failure, unspecified: Secondary | ICD-10-CM

## 2020-03-19 DIAGNOSIS — J449 Chronic obstructive pulmonary disease, unspecified: Secondary | ICD-10-CM

## 2020-03-19 DIAGNOSIS — R609 Edema, unspecified: Secondary | ICD-10-CM

## 2020-03-19 DIAGNOSIS — J302 Other seasonal allergic rhinitis: Secondary | ICD-10-CM

## 2020-03-19 DIAGNOSIS — J479 Bronchiectasis, uncomplicated: Secondary | ICD-10-CM

## 2020-03-19 DIAGNOSIS — J4550 Severe persistent asthma, uncomplicated: Secondary | ICD-10-CM

## 2020-03-19 DIAGNOSIS — G4733 Obstructive sleep apnea (adult) (pediatric): Secondary | ICD-10-CM

## 2020-03-19 DIAGNOSIS — K219 Gastro-esophageal reflux disease without esophagitis: Secondary | ICD-10-CM

## 2020-03-19 DIAGNOSIS — R053 Chronic cough: Secondary | ICD-10-CM

## 2020-03-19 DIAGNOSIS — M954 Acquired deformity of chest and rib: Secondary | ICD-10-CM

## 2020-03-19 DIAGNOSIS — J45909 Unspecified asthma, uncomplicated: Secondary | ICD-10-CM

## 2020-03-19 DIAGNOSIS — M961 Postlaminectomy syndrome, not elsewhere classified: Secondary | ICD-10-CM

## 2020-03-19 DIAGNOSIS — E782 Mixed hyperlipidemia: Secondary | ICD-10-CM

## 2020-03-19 DIAGNOSIS — I2510 Atherosclerotic heart disease of native coronary artery without angina pectoris: Secondary | ICD-10-CM

## 2020-03-19 DIAGNOSIS — R0600 Dyspnea, unspecified: Secondary | ICD-10-CM

## 2020-03-19 DIAGNOSIS — U071 COVID-19 virus infection: Secondary | ICD-10-CM

## 2020-03-19 DIAGNOSIS — N183 CKD (chronic kidney disease) stage 3, GFR 30-59 ml/min (HCC): Secondary | ICD-10-CM

## 2020-03-19 DIAGNOSIS — Z8679 Personal history of other diseases of the circulatory system: Secondary | ICD-10-CM

## 2020-03-19 DIAGNOSIS — J438 Other emphysema: Secondary | ICD-10-CM

## 2020-03-19 DIAGNOSIS — I714 Abdominal aortic aneurysm, without rupture: Secondary | ICD-10-CM

## 2020-03-19 DIAGNOSIS — Z8614 Personal history of Methicillin resistant Staphylococcus aureus infection: Secondary | ICD-10-CM

## 2020-03-19 DIAGNOSIS — Z9981 Dependence on supplemental oxygen: Secondary | ICD-10-CM

## 2020-03-19 DIAGNOSIS — I429 Cardiomyopathy, unspecified: Secondary | ICD-10-CM

## 2020-03-19 DIAGNOSIS — I10 Essential (primary) hypertension: Secondary | ICD-10-CM

## 2020-03-19 DIAGNOSIS — Z95828 Presence of other vascular implants and grafts: Secondary | ICD-10-CM

## 2020-03-19 DIAGNOSIS — L03116 Cellulitis of left lower limb: Secondary | ICD-10-CM

## 2020-03-19 MED ORDER — BUDESONIDE 0.25 MG/2 ML IN NBSP
0.25 mg | Freq: Two times a day (BID) | RESPIRATORY_TRACT | 11 refills | Status: CN
Start: 2020-03-19 — End: ?

## 2020-03-19 MED ORDER — ALBUTEROL SULFATE 2.5 MG /3 ML (0.083 %) IN NEBU
3 mL | RESPIRATORY_TRACT | 11 refills | Status: AC | PRN
Start: 2020-03-19 — End: ?
  Filled 2020-03-19: qty 270, 30d supply, fill #1

## 2020-03-19 MED ORDER — ARFORMOTEROL 15 MCG/2 ML IN NEBU
15 ug | Freq: Two times a day (BID) | RESPIRATORY_TRACT | 11 refills | Status: CN
Start: 2020-03-19 — End: ?

## 2020-03-23 ENCOUNTER — Encounter: Admit: 2020-03-23 | Discharge: 2020-03-23 | Payer: MEDICARE

## 2020-03-23 ENCOUNTER — Ambulatory Visit: Admit: 2020-03-23 | Discharge: 2020-03-24 | Payer: MEDICARE

## 2020-03-23 DIAGNOSIS — J439 Emphysema, unspecified: Secondary | ICD-10-CM

## 2020-03-23 DIAGNOSIS — J4540 Moderate persistent asthma, uncomplicated: Secondary | ICD-10-CM

## 2020-03-23 DIAGNOSIS — L03116 Cellulitis of left lower limb: Secondary | ICD-10-CM

## 2020-03-23 DIAGNOSIS — Z20822 Encounter for screening laboratory testing for COVID-19 virus: Secondary | ICD-10-CM

## 2020-03-23 DIAGNOSIS — I5042 Chronic combined systolic (congestive) and diastolic (congestive) heart failure: Secondary | ICD-10-CM

## 2020-03-23 DIAGNOSIS — K219 Gastro-esophageal reflux disease without esophagitis: Secondary | ICD-10-CM

## 2020-03-23 LAB — COVID-19 (SARS-COV-2) PCR

## 2020-03-23 MED ORDER — DOXYCYCLINE HYCLATE 100 MG PO TAB
100 mg | ORAL_TABLET | Freq: Two times a day (BID) | ORAL | 0 refills | 8.00000 days | Status: AC
Start: 2020-03-23 — End: ?
  Filled 2020-03-23: qty 14, 7d supply, fill #1

## 2020-03-23 MED ORDER — PANTOPRAZOLE 40 MG PO TBEC
40 mg | ORAL_TABLET | Freq: Two times a day (BID) | ORAL | 3 refills | 90.00000 days | Status: AC
Start: 2020-03-23 — End: ?
  Filled 2020-03-23: qty 180, 90d supply, fill #1

## 2020-03-23 NOTE — Patient Instructions
Future Appointments   Date Time Provider Department Center   04/06/2020  8:00 AM Joanie Coddington, MD [facility]   04/12/2020 12:00 AM [facility] Procedur   05/13/2020 12:00 AM [facility] Procedur   05/14/2020 11:20 AM Hyman Bower A, DO [facility] IM   06/01/2020 11:15 AM Davene Costain, Lenice Llamas, PA-C [facility]   06/14/2020 12:00 AM [facility] C[facility] Procedur   08/27/2020 10:40 AM Deveron Furlong, MD [facility] IM       If you are on Mychart, you will now be able to read your clinic note from me.  My hope is that this will improve your engagement and insight into your health care and improve the accuracy of your medical record.  If you find inaccuracies, I apologize.  Please bring any concerns or inaccuracies to your next appointment.  Please remember that medical language and documentation is very different from how you and I speak and our dictation software is not perfect.    General Instructions:  ? How to reach me:   Please send a MyChart message to the General Medicine clinic or call Wynona Canes at [phone].    ? How to get a medication refill:  Please use the MyChart Refill request or contact your pharmacy directly to request medication refills. Please allow 48 hours.     ? How to receive your test results:  If you have signed up for MyChart, you will receive your test results and messages from me this way.  Otherwise, you will get a phone call or letter.   If you are expecting results and have not heard from my office within 2 weeks of your testing, please send a MyChart message or call my office.     ? Scheduling:  Our Scheduling phone number is [phone].  Same Day appointments are usually available. Please ask for an annual or yearly physical appointment if it has been over 1 year since our last appointment.  ? Appointment Reminders on your cell phone: Make sure we have your cell phone number, and Text [place] to [phone].  ? Support groups for many chronic illnesses are available through [facility]: [email or web address] or [phone].

## 2020-03-23 NOTE — Progress Notes
History of Present Illness  Ruben Wood. is a 80 y.o. male    Ed was recently admitted for increased shortness of breath, cough and increased urine production, as well as hypoxia.  He had about a week long hospital stay.  His chest x-ray did not show any signs of consolidation and sputum culture had no growth.  He was treated for a few days with IV cefepime, but then switched over to Augmentin to complete a 10-day course.  He is also given IV Solu-Medrol with a prednisone taper thereafter.  He was seen by the pulmonary team who placed him on nebulized Pulmicort and Brovana.  He also had an esophagram which was largely unremarkable as it was thought that aspiration from acid reflux was possibly contributing to his frequent pulmonary exacerbations.  He was also seen by the heart failure team and his CardioMEMS parameters were showing a euvolemic state.    Today, he notes that he is feeling better overall.  His breathing is much better.  He likes the nebulized treatments and seems like it is working better for him overall.  He is still coughing and having some sputum production, but is much better than what it has been historically.  He does note though today that he has a new onset headache as well as some upper respiratory symptoms of sinus pressure and pain.  This is very unusual for him.  He is worried that this could possibly be Covid.  He has not had any specific Covid contacts and is fully vaccinated.  He has not had the booster vaccine, however.    He also has chronic venous stasis dermatitis.  He has some weeping on the left lower extremity with some increased redness and pain and swelling.       Review of Systems  Per HPI    Objective:         ? acetaminophen (TYLENOL) 325 mg tablet Take two tablets by mouth every 6 hours as needed for Pain.   ? albuterol 0.083% (PROVENTIL) 2.5 mg /3 mL (0.083 %) nebulizer solution Inhale 3 mL solution by nebulizer as directed every 6 hours as needed for Wheezing or Shortness of Breath. Indications: asthma   ? albuterol sulfate (PROAIR HFA) 90 mcg/actuation aerosol inhaler Inhale two puffs by mouth into the lungs every 4 hours as needed for Wheezing or Shortness of Breath.   ? allopurinoL (ZYLOPRIM) 300 mg tablet Take 300 mg by mouth daily. Take with food.   ? AMITR/GABAPEN/EMU OIL 05/24/08% CREAM (COMPOUND) Apply topically to affected area three times daily as needed.   ? arformoteroL (BROVANA) 15 mcg/2 mL nebulizer solution Inhale 2 mL solution by nebulizer as directed twice daily. Dx: J44.9   ? ASCORBIC ACID-ASCORBATE SODIUM PO Take 500 mg by mouth every morning.   ? aspirin EC 81 mg tablet Take one tablet by mouth daily. Take with food.   ? atorvastatin (LIPITOR) 40 mg tablet TAKE (1) TABLET BY MOUTH ONCE DAILY   ? benzonatate (TESSALON PERLES) 100 mg capsule Take one capsule by mouth every 8 hours.   ? budesonide (PULMICORT) 0.25 mg/2 mL nebulizer solution Inhale 2 mL solution by nebulizer as directed twice daily. Dx: J44.9   ? bumetanide (BUMEX) 2 mg tablet Take one tablet by mouth twice daily.   ? cholecalciferol (VITAMIN D-3) 1,000 units tablet Take 1,000 Units by mouth daily.   ? doxycycline hyclate (VIBRACIN) 100 mg tablet Take one tablet by mouth twice daily for 7  days.   ? duloxetine DR (CYMBALTA) 60 mg capsule Take one capsule by mouth daily.   ? empagliflozin (JARDIANCE) 25 mg tablet Take one tablet by mouth daily.   ? finasteride (PROSCAR) 5 mg tablet Take one tablet by mouth daily.   ? fish oil- omega 3-DHA/EPA 300/1,000 mg capsule Take 1 capsule by mouth daily.   ? gabapentin (NEURONTIN) 100 mg capsule Take one capsule by mouth every 8 hours.   ? ipratropium bromide (ATROVENT) 21 mcg (0.03 %) nasal spray Apply two sprays to each nostril as directed every 12 hours.   ? Miscellaneous Medical Supply misc Rx: Please wrap legs with ACE bandage from toes as high up to the leg as possible bilaterally  Dx: Lymphedema   ? montelukast (SINGULAIR) 10 mg tablet Take one tablet by mouth at bedtime daily.   ? nitroglycerin (NITROSTAT) 0.4 mg tablet Place 0.4 mg under tongue every 5 minutes as needed for Chest Pain. Max of 3 tablets, call 911.   ? nystatin (MYCOSTATIN) 100,000 unit/g topical ointment Apply to affected areas twice daily as needed   ? oxyCODONE (ROXICODONE) 5 mg tablet Take one tablet to two tablets by mouth every 8 hours as needed for Pain   ? pantoprazole DR (PROTONIX) 40 mg tablet Take one tablet by mouth twice daily.   ? spironolactone (ALDACTONE) 25 mg tablet Take one tablet by mouth twice daily. Take with food.   ? tamsulosin (FLOMAX) 0.4 mg capsule TAKE (1) CAPSULE BY MOUTH ONCE DAILY.   ? zinc sulfate 220 mg (50 mg elemental zinc) capsule Take 220 mg by mouth daily.     Vitals:    03/23/20 1110   BP: 139/77   Pulse: 98   Temp: 36.7 ?C (98.1 ?F)   SpO2: (!) 91%   Weight: 135.6 kg (299 lb)   Height: 180.3 cm (70.98)   PainSc: Zero       Body mass index is 41.73 kg/m?[name]     Physical Exam  Vitals and nursing note reviewed.   Constitutional:       General: He is not in acute distress.     Appearance: He is well-developed. He is not diaphoretic.   HENT:      Head: Normocephalic and atraumatic.   Neck:      Thyroid: No thyromegaly.      Vascular: No JVD.   Cardiovascular:      Rate and Rhythm: Normal rate and regular rhythm.      Heart sounds: Normal heart sounds. No murmur heard.   No friction rub. No gallop.    Pulmonary:      Effort: Pulmonary effort is normal. No respiratory distress.      Breath sounds: Normal breath sounds. No wheezing or rales.   Musculoskeletal:      Right lower leg: Edema present.      Left lower leg: Edema present.   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         Labwork reviewed:  Lab Results   Component Value Date/Time    HGBA1C 5.9 11/25/2019 02:45 PM    HGBA1C 5.9 09/02/2019 09:50 AM    HGBA1C 6.2 (A) 04/28/2019 12:00 AM    HGBPOC 11.2 (L) 10/25/2012 10:58 AM    HGBPOC 12.9 (L) 10/25/2012 08:55 AM  MCALBR 7.6 04/29/2019 12:00 AM    TSH 2.49 03/05/2020 08:40 PM    FREET4R 0.99 12/15/2014 09:12 AM    CHOL 106 12/05/2018 01:57 AM    TRIG 108 12/05/2018 01:57 AM    HDL 31 (L) 12/05/2018 01:57 AM    LDL 60 12/05/2018 01:57 AM    NA 138 03/10/2020 04:22 AM    K 4.5 03/10/2020 04:22 AM    CL 99 03/10/2020 04:22 AM    CO2 28 03/10/2020 04:22 AM    GAP 11 03/10/2020 04:22 AM    BUN 52 (H) 03/10/2020 04:22 AM    CR 1.46 (H) 03/10/2020 04:22 AM    GLU 125 (H) 03/10/2020 04:22 AM    CA 9.1 03/10/2020 04:22 AM    PO4 4.1 03/08/2020 03:51 AM    ALBUMIN 3.9 03/10/2020 04:22 AM    TOTPROT 6.2 03/10/2020 04:22 AM    ALKPHOS 59 03/10/2020 04:22 AM    AST 16 03/10/2020 04:22 AM    ALT 16 03/10/2020 04:22 AM    TOTBILI 0.8 03/10/2020 04:22 AM    GFR 44 01/30/2020 12:00 AM    GFRAA >60 12/03/2019 04:51 AM    PSA 2.96 11/25/2018 12:00 AM              Assessment and Plan:    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 Chronic combined systolic and diastolic congestive heart failure, NYHA class 2 (HCC)    3. Moderate persistent asthma, unspecified whether complicated    4. Other specified symptoms and signs involving the circulatory and respiratory systems    5. New daily persistent headache (ndph)    6. Rhinorrhea    7. Encounter for screening laboratory testing for COVID-19 virus    8. Chronic GERD    9. Cellulitis of left lower extremity      Ed is here for follow-up.  From a pulmonary standpoint, he seems to be doing better overall.  His breathing is much easier and his exam today had much less rhonchi and wheezing than what it typically does.  The nebulized treatments appear to be more effective for him overall, so we will continue with that.  Continue to follow with the pulmonary clinic.    From a heart standpoint, he appears euvolemic today.  He does have a CardioMEMS in place and he is on goal-directed medical therapy, so we will continue with the current course.    He is having some upper respiratory infectious symptoms as well as a new onset headache which is unusual for him.  I did do a Covid 19 PCR swab while he was in clinic today to rule that out.  This could be another viral type infection, which case we will continue to treat this supportively.  For the headache I advised that he use acetaminophen 1000 mg 3 times daily as needed.  I advised that he use some Flonase for the rhinorrhea.    For the GERD, he will need to be on pantoprazole going forward.  I want him on 40 mg twice daily for least 3 months and then we can start to taper it after that.    Lastly, he has a new onset cellulitis.  He does have venous stasis dermatitis with skin breakdown.  He has a lot of Lipodermal sclerosis.  I will place him on doxycycline for a 7-day course.  He will let me know if this does not improve things.  Total of 40 minutes were spent on the same day of the visit including preparing to see the patient, obtaining and/or reviewing separately obtained history, performing a medically appropriate examination and/or evaluation, counseling and educating the patient/family/caregiver, ordering medications, tests, or procedures, referring and communication with other health care professionals, documenting clinical information in the electronic or other health record, independently interpreting results and communicating results to the patient/family/caregiver, and care coordination.         Patient Instructions     Future Appointments   Date Time Provider Department Center   04/06/2020  8:00 AM Joanie Coddington, MD [facility]   04/12/2020 12:00 AM [facility]IT[facility] Procedur   05/13/2020 12:00 AM [facility]OTE [facility]IT[facility] Procedur   05/14/2020 11:20 AM Hyman Bower A, DO [facility] IM   06/01/2020 11:15 AM Davene Costain, Lenice Llamas, PA-C [facility]   06/14/2020 12:00 AM [facility]OTE [facility]IT[facility] C[facility] Procedur   08/27/2020 10:40 AM Deveron Furlong, MD [facility] IM       If you are on Mychart, you will now be able to read your clinic note from me.  My hope is that this will improve your engagement and insight into your health care and improve the accuracy of your medical record.  If you find inaccuracies, I apologize.  Please bring any concerns or inaccuracies to your next appointment.  Please remember that medical language and documentation is very different from how you and I speak and our dictation software is not perfect.    General Instructions:  ? How to reach me:   Please send a MyChart message to the General Medicine clinic or call Wynona Canes at [phone].    ? How to get a medication refill:  Please use the MyChart Refill request or contact your pharmacy directly to request medication refills. Please allow 48 hours.     ? How to receive your test results:  If you have signed up for MyChart, you will receive your test results and messages from me this way.  Otherwise, you will get a phone call or letter.   If you are expecting results and have not heard from my office within 2 weeks of your testing, please send a MyChart message or call my office.     ? Scheduling:  Our Scheduling phone number is [phone].  Same Day appointments are usually available. Please ask for an annual or yearly physical appointment if it has been over 1 year since our last appointment.  ? Appointment Reminders on your cell phone: Make sure we have your cell phone number, and Text [place] to [phone].  ? Support groups for many chronic illnesses are available through [facility]: [email or web address] or [phone].             Return in 29 days (on 04/21/2020).

## 2020-03-24 ENCOUNTER — Encounter: Admit: 2020-03-24 | Discharge: 2020-03-24 | Payer: MEDICARE

## 2020-03-24 DIAGNOSIS — J4541 Moderate persistent asthma with (acute) exacerbation: Secondary | ICD-10-CM

## 2020-03-24 DIAGNOSIS — G4452 New daily persistent headache (NDPH): Secondary | ICD-10-CM

## 2020-03-24 DIAGNOSIS — R0989 Other specified symptoms and signs involving the circulatory and respiratory systems: Secondary | ICD-10-CM

## 2020-03-24 DIAGNOSIS — J3489 Other specified disorders of nose and nasal sinuses: Secondary | ICD-10-CM

## 2020-03-24 MED ORDER — ALBUTEROL SULFATE 90 MCG/ACTUATION IN HFAA
2 | RESPIRATORY_TRACT | 11 refills | Status: AC | PRN
Start: 2020-03-24 — End: ?
  Filled 2020-03-24: qty 18, 17d supply, fill #1

## 2020-03-24 NOTE — Telephone Encounter
LOV 03/19/2020, albuterol in POC to continue. FU appt 08/2020.  Refilled per protocol.

## 2020-04-01 ENCOUNTER — Encounter

## 2020-04-01 DIAGNOSIS — M5431 Sciatica, right side: Secondary | ICD-10-CM

## 2020-04-01 MED ORDER — DULOXETINE 60 MG PO CPDR
60 mg | ORAL_CAPSULE | Freq: Every day | ORAL | 1 refills | 60.00000 days | Status: AC
Start: 2020-04-01 — End: ?

## 2020-04-01 MED ORDER — ATORVASTATIN 40 MG PO TAB
40 mg | ORAL_TABLET | Freq: Every day | ORAL | 1 refills | Status: AC
Start: 2020-04-01 — End: ?

## 2020-04-01 NOTE — Telephone Encounter
Hepatic Function    Lab Results   Component Value Date/Time    ALBUMIN 3.9 03/10/2020 04:22 AM    TOTPROT 6.2 03/10/2020 04:22 AM    ALKPHOS 59 03/10/2020 04:22 AM    Lab Results   Component Value Date/Time    AST 16 03/10/2020 04:22 AM    ALT 16 03/10/2020 04:22 AM    TOTBILI 0.8 03/10/2020 04:22 AM        Fax from [facility] in [place] received requesting scripts for duloxetine and atorvastatin. Medications on standing protocol. Patient has been seen in last 12 months. New prescription sent to the pharmacy.   Vinie Sill, RN

## 2020-04-02 ENCOUNTER — Encounter

## 2020-04-02 DIAGNOSIS — Z95828 Presence of other vascular implants and grafts: Secondary | ICD-10-CM

## 2020-04-02 DIAGNOSIS — J302 Other seasonal allergic rhinitis: Secondary | ICD-10-CM

## 2020-04-02 DIAGNOSIS — K219 Gastro-esophageal reflux disease without esophagitis: Secondary | ICD-10-CM

## 2020-04-02 DIAGNOSIS — R609 Edema, unspecified: Secondary | ICD-10-CM

## 2020-04-02 DIAGNOSIS — I714 Abdominal aortic aneurysm, without rupture: Secondary | ICD-10-CM

## 2020-04-02 DIAGNOSIS — J449 Chronic obstructive pulmonary disease, unspecified: Secondary | ICD-10-CM

## 2020-04-02 DIAGNOSIS — J438 Other emphysema: Secondary | ICD-10-CM

## 2020-04-02 DIAGNOSIS — G4733 Obstructive sleep apnea (adult) (pediatric): Secondary | ICD-10-CM

## 2020-04-02 DIAGNOSIS — R0600 Dyspnea, unspecified: Secondary | ICD-10-CM

## 2020-04-02 DIAGNOSIS — I5042 Chronic combined systolic (congestive) and diastolic (congestive) heart failure: Secondary | ICD-10-CM

## 2020-04-02 DIAGNOSIS — J189 Pneumonia, unspecified organism: Secondary | ICD-10-CM

## 2020-04-02 DIAGNOSIS — Z9981 Dependence on supplemental oxygen: Secondary | ICD-10-CM

## 2020-04-02 DIAGNOSIS — J45909 Unspecified asthma, uncomplicated: Secondary | ICD-10-CM

## 2020-04-02 DIAGNOSIS — Z8679 Personal history of other diseases of the circulatory system: Secondary | ICD-10-CM

## 2020-04-02 DIAGNOSIS — L03116 Cellulitis of left lower limb: Secondary | ICD-10-CM

## 2020-04-02 DIAGNOSIS — M961 Postlaminectomy syndrome, not elsewhere classified: Secondary | ICD-10-CM

## 2020-04-02 DIAGNOSIS — N183 CKD (chronic kidney disease) stage 3, GFR 30-59 ml/min (HCC): Secondary | ICD-10-CM

## 2020-04-02 DIAGNOSIS — E782 Mixed hyperlipidemia: Secondary | ICD-10-CM

## 2020-04-02 DIAGNOSIS — J470 Bronchiectasis with acute lower respiratory infection: Secondary | ICD-10-CM

## 2020-04-02 DIAGNOSIS — J9621 Acute and chronic respiratory failure with hypoxia: Secondary | ICD-10-CM

## 2020-04-02 DIAGNOSIS — I2510 Atherosclerotic heart disease of native coronary artery without angina pectoris: Secondary | ICD-10-CM

## 2020-04-02 DIAGNOSIS — R053 Chronic cough: Secondary | ICD-10-CM

## 2020-04-02 DIAGNOSIS — I10 Essential (primary) hypertension: Secondary | ICD-10-CM

## 2020-04-02 DIAGNOSIS — J479 Bronchiectasis, uncomplicated: Secondary | ICD-10-CM

## 2020-04-02 DIAGNOSIS — I509 Heart failure, unspecified: Secondary | ICD-10-CM

## 2020-04-02 DIAGNOSIS — N401 Enlarged prostate with lower urinary tract symptoms: Secondary | ICD-10-CM

## 2020-04-02 DIAGNOSIS — M954 Acquired deformity of chest and rib: Secondary | ICD-10-CM

## 2020-04-02 DIAGNOSIS — Z8614 Personal history of Methicillin resistant Staphylococcus aureus infection: Secondary | ICD-10-CM

## 2020-04-02 DIAGNOSIS — I429 Cardiomyopathy, unspecified: Secondary | ICD-10-CM

## 2020-04-02 LAB — CBC: Lab: 4.8 K/UL (ref 4.5–11.0)

## 2020-04-02 LAB — TROPONIN-I: Lab: 0 ng/mL — ABNORMAL LOW (ref 0.0–0.05)

## 2020-04-02 MED ORDER — OXYCODONE 5 MG PO TAB
5-10 mg | ORAL | 0 refills | Status: AC | PRN
Start: 2020-04-02 — End: ?

## 2020-04-02 MED ORDER — ZOLPIDEM 5 MG PO TAB
5 mg | Freq: Every evening | ORAL | 0 refills | Status: AC | PRN
Start: 2020-04-02 — End: ?
  Administered 2020-04-03 – 2020-04-26 (×23): 5 mg via ORAL

## 2020-04-02 MED ORDER — ARFORMOTEROL 15 MCG/2 ML IN NEBU
15 ug | Freq: Two times a day (BID) | RESPIRATORY_TRACT | 0 refills
Start: 2020-04-02 — End: ?

## 2020-04-02 MED ORDER — ONDANSETRON HCL (PF) 4 MG/2 ML IJ SOLN
4 mg | INTRAVENOUS | 0 refills | Status: AC | PRN
Start: 2020-04-02 — End: ?

## 2020-04-02 MED ORDER — ENOXAPARIN 40 MG/0.4 ML SC SYRG
40 mg | Freq: Every day | SUBCUTANEOUS | 0 refills | Status: AC
Start: 2020-04-02 — End: ?
  Administered 2020-04-03: 04:00:00 40 mg via SUBCUTANEOUS

## 2020-04-02 MED ORDER — METHYLPREDNISOLONE SOD SUC(PF) 125 MG/2 ML IJ SOLR
62.5 mg | Freq: Two times a day (BID) | INTRAVENOUS | 0 refills | Status: AC
Start: 2020-04-02 — End: ?
  Administered 2020-04-03 – 2020-04-07 (×9): 62.5 mg via INTRAVENOUS

## 2020-04-02 MED ORDER — IPRATROPIUM-ALBUTEROL 0.5 MG-3 MG(2.5 MG BASE)/3 ML IN NEBU
3 mL | RESPIRATORY_TRACT | 0 refills | Status: DC | PRN
Start: 2020-04-02 — End: 2020-04-03

## 2020-04-02 MED ORDER — DULOXETINE 60 MG PO CPDR
60 mg | Freq: Every day | ORAL | 0 refills | Status: AC
Start: 2020-04-02 — End: ?
  Administered 2020-04-03 – 2020-04-26 (×24): 60 mg via ORAL

## 2020-04-02 MED ORDER — SPIRONOLACTONE 25 MG PO TAB
25 mg | Freq: Two times a day (BID) | ORAL | 0 refills | Status: AC
Start: 2020-04-02 — End: ?
  Administered 2020-04-03 – 2020-04-26 (×47): 25 mg via ORAL

## 2020-04-02 MED ORDER — PANTOPRAZOLE 40 MG PO TBEC
40 mg | Freq: Two times a day (BID) | ORAL | 0 refills | Status: AC
Start: 2020-04-02 — End: ?
  Administered 2020-04-03 – 2020-04-26 (×48): 40 mg via ORAL

## 2020-04-02 MED ORDER — ACETAMINOPHEN 325 MG PO TAB
650 mg | ORAL | 0 refills | Status: AC | PRN
Start: 2020-04-02 — End: ?
  Administered 2020-04-11 – 2020-04-21 (×6): 650 mg via ORAL

## 2020-04-02 MED ORDER — ATORVASTATIN 40 MG PO TAB
40 mg | Freq: Every day | ORAL | 0 refills | Status: AC
Start: 2020-04-02 — End: ?
  Administered 2020-04-03 – 2020-04-26 (×24): 40 mg via ORAL

## 2020-04-02 MED ORDER — ALBUTEROL SULFATE 90 MCG/ACTUATION IN HFAA
2 | RESPIRATORY_TRACT | 0 refills | Status: AC | PRN
Start: 2020-04-02 — End: ?

## 2020-04-02 MED ORDER — IPRATROPIUM-ALBUTEROL 0.5 MG-3 MG(2.5 MG BASE)/3 ML IN NEBU
3 mL | Freq: Four times a day (QID) | RESPIRATORY_TRACT | 0 refills | Status: AC
Start: 2020-04-02 — End: ?

## 2020-04-02 MED ORDER — DOXYCYCLINE HYCLATE 100 MG PO TAB
100 mg | Freq: Two times a day (BID) | ORAL | 0 refills | Status: AC
Start: 2020-04-02 — End: ?
  Administered 2020-04-03 – 2020-04-07 (×10): 100 mg via ORAL

## 2020-04-02 MED ORDER — FINASTERIDE 5 MG PO TAB
5 mg | Freq: Every day | ORAL | 0 refills | Status: AC
Start: 2020-04-02 — End: ?
  Administered 2020-04-03 – 2020-04-26 (×24): 5 mg via ORAL

## 2020-04-02 MED ORDER — BUDESONIDE 0.25 MG/2 ML IN NBSP
0.25 mg | Freq: Two times a day (BID) | RESPIRATORY_TRACT | 0 refills | Status: AC
Start: 2020-04-02 — End: ?
  Administered 2020-04-03 – 2020-04-26 (×47): 0.25 mg via RESPIRATORY_TRACT

## 2020-04-02 MED ORDER — BENZONATATE 100 MG PO CAP
100 mg | ORAL | 0 refills | Status: AC
Start: 2020-04-02 — End: ?
  Administered 2020-04-03 – 2020-04-26 (×62): 100 mg via ORAL

## 2020-04-02 MED ORDER — ARFORMOTEROL 15 MCG/2 ML IN NEBU
15 ug | Freq: Two times a day (BID) | RESPIRATORY_TRACT | 0 refills | Status: AC
Start: 2020-04-02 — End: ?
  Administered 2020-04-03 – 2020-04-26 (×46): 15 ug via RESPIRATORY_TRACT

## 2020-04-02 MED ORDER — BUDESONIDE 0.25 MG/2 ML IN NBSP
0.25 mg | Freq: Two times a day (BID) | RESPIRATORY_TRACT | 0 refills
Start: 2020-04-02 — End: ?

## 2020-04-02 MED ORDER — GABAPENTIN 100 MG PO CAP
100 mg | ORAL | 0 refills | Status: AC
Start: 2020-04-02 — End: ?
  Administered 2020-04-03 – 2020-04-07 (×15): 100 mg via ORAL

## 2020-04-02 MED ORDER — MONTELUKAST 10 MG PO TAB
10 mg | Freq: Every evening | ORAL | 0 refills | Status: AC
Start: 2020-04-02 — End: ?
  Administered 2020-04-03 – 2020-04-26 (×24): 10 mg via ORAL

## 2020-04-02 MED ORDER — BUMETANIDE 2 MG PO TAB
2 mg | Freq: Two times a day (BID) | ORAL | 0 refills | Status: AC
Start: 2020-04-02 — End: ?
  Administered 2020-04-03: 15:00:00 2 mg via ORAL

## 2020-04-02 MED ORDER — ALLOPURINOL 300 MG PO TAB
300 mg | Freq: Every day | ORAL | 0 refills | Status: AC
Start: 2020-04-02 — End: ?
  Administered 2020-04-03 – 2020-04-26 (×24): 300 mg via ORAL

## 2020-04-02 MED ORDER — TAMSULOSIN 0.4 MG PO CAP
.4 mg | Freq: Every evening | ORAL | 0 refills | Status: AC
Start: 2020-04-02 — End: ?
  Administered 2020-04-03 – 2020-04-26 (×23): 0.4 mg via ORAL

## 2020-04-02 MED ORDER — ONDANSETRON 4 MG PO TBDI
4 mg | ORAL | 0 refills | Status: AC | PRN
Start: 2020-04-02 — End: ?

## 2020-04-02 MED ORDER — ASPIRIN 81 MG PO TBEC
81 mg | Freq: Every day | ORAL | 0 refills | Status: AC
Start: 2020-04-02 — End: ?
  Administered 2020-04-03 – 2020-04-05 (×3): 81 mg via ORAL

## 2020-04-02 MED ORDER — BISACODYL 10 MG RE SUPP
10 mg | Freq: Every day | RECTAL | 0 refills | Status: AC | PRN
Start: 2020-04-02 — End: ?

## 2020-04-02 MED ORDER — POLYETHYLENE GLYCOL 3350 17 GRAM PO PWPK
1 | Freq: Every day | ORAL | 0 refills | Status: AC | PRN
Start: 2020-04-02 — End: ?
  Administered 2020-04-04: 14:00:00 17 g via ORAL

## 2020-04-02 MED ORDER — MELATONIN 5 MG PO TAB
5 mg | Freq: Every evening | ORAL | 0 refills | Status: AC | PRN
Start: 2020-04-02 — End: ?
  Administered 2020-04-03 – 2020-04-26 (×19): 5 mg via ORAL

## 2020-04-02 NOTE — Telephone Encounter
Voalte message below received:   Attending is Poddutoori.  Diagnosis is acute on chronic hypoxia secondary to asthma exacerbation    Admitting contacted spoke with Nehemiah Settle providing information.    I was told they would call when a bed became available likely within the next 30 minutes.    I called patients wife to make aware.    Etta Grandchild, RN

## 2020-04-02 NOTE — Telephone Encounter
Patients wife called stating patient has cough and shortness of breath.  She is concerned by this as is Actuary that is a firend of theirs.    I returned call and patients wife provided information below:    States he has slight antibodies to COVID so unsure if he had COVID prior to being tested at [facility].    [place] Genetic[place], molecular COVID tested him today with rapid testing that was negative.    States she does not have any antibodies on testing.    Advised Telehealth visit today @ 4:00 pm.  Patient and wife are in agreement to this.    Etta Grandchild, RN

## 2020-04-02 NOTE — Progress Notes
History of Present Illness  Ruben Wood. is a 80 y.o. male    Obtained patient's verbal consent to treat them and their agreement to [facility] financial policy and NPP via this telehealth visit during the [facility]    Ed was recently admitted for increased shortness of breath, cough and increased urine production, as well as hypoxia.  He had about a week long hospital stay.  His chest x-ray did not show any signs of consolidation and sputum culture had no growth.  He was treated for a few days with IV cefepime, but then switched over to Augmentin to complete a 10-day course.  He is also given IV Solu-Medrol with a prednisone taper thereafter.  He was seen by the pulmonary team who placed him on nebulized Pulmicort and Brovana.  He also had an esophagram which was largely unremarkable as it was thought that aspiration from acid reflux was possibly contributing to his frequent pulmonary exacerbations.  He was also seen by the heart failure team and his CardioMEMS parameters were showing a euvolemic state.    Today, I am seeing him because his symptoms have returned.  He has been doing quite well at home for the past couple of weeks, but over the past couple of days he is started to have increased cough and shortness of breath.  Today, his symptoms got acutely worse.  His coughing is nearly nonstop.  He is become much more short of breath and hypoxic.  He had to put his oxygen back on up at 4 L as his oxygen saturation was down around 88%.  On 4 L, he is up to 94%.  He feels short of breath and he feels like there is a gurgling sensation in his chest.  He does not have any fevers or chills.  He did go to get a Covid test locally at [facility] and per his report he had what sounds like an antibody test that had an equivocal IgM level, but his rapid test was negative.  His wife, who is also on the call, tested negative for Covid.  He has been vaccinated, but he has suffered from acute COVID-19 himself in early 2021.        Review of Systems  Per HPI    Objective:         ? acetaminophen (TYLENOL) 325 mg tablet Take two tablets by mouth every 6 hours as needed for Pain.   ? albuterol 0.083% (PROVENTIL) 2.5 mg /3 mL (0.083 %) nebulizer solution Inhale 3 mL solution by nebulizer as directed every 6 hours as needed for Wheezing or Shortness of Breath. Indications: asthma   ? albuterol sulfate (PROAIR HFA) 90 mcg/actuation HFA aerosol inhaler Inhale two puffs by mouth into the lungs every 4 hours as needed for Wheezing or Shortness of Breath.   ? allopurinoL (ZYLOPRIM) 300 mg tablet Take 300 mg by mouth daily. Take with food.   ? AMITR/GABAPEN/EMU OIL 05/24/08% CREAM (COMPOUND) Apply topically to affected area three times daily as needed.   ? arformoteroL (BROVANA) 15 mcg/2 mL nebulizer solution Inhale 2 mL solution by nebulizer as directed twice daily. Dx: J44.9   ? ASCORBIC ACID-ASCORBATE SODIUM PO Take 500 mg by mouth every morning.   ? aspirin EC 81 mg tablet Take one tablet by mouth daily. Take with food.   ? atorvastatin (LIPITOR) 40 mg tablet Take one tablet by mouth daily.   ? benzonatate (TESSALON PERLES) 100 mg capsule Take  one capsule by mouth every 8 hours.   ? budesonide (PULMICORT) 0.25 mg/2 mL nebulizer solution Inhale 2 mL solution by nebulizer as directed twice daily. Dx: J44.9   ? bumetanide (BUMEX) 2 mg tablet Take one tablet by mouth twice daily.   ? cholecalciferol (VITAMIN D-3) 1,000 units tablet Take 1,000 Units by mouth daily.   ? duloxetine DR (CYMBALTA) 60 mg capsule Take one capsule by mouth daily.   ? empagliflozin (JARDIANCE) 25 mg tablet Take one tablet by mouth daily.   ? finasteride (PROSCAR) 5 mg tablet Take one tablet by mouth daily.   ? fish oil- omega 3-DHA/EPA 300/1,000 mg capsule Take 1 capsule by mouth daily.   ? gabapentin (NEURONTIN) 100 mg capsule Take one capsule by mouth every 8 hours.   ? ipratropium bromide (ATROVENT) 21 mcg (0.03 %) nasal spray Apply two sprays to each nostril as directed every 12 hours.   ? Miscellaneous Medical Supply misc Rx: Please wrap legs with ACE bandage from toes as high up to the leg as possible bilaterally  Dx: Lymphedema   ? montelukast (SINGULAIR) 10 mg tablet Take one tablet by mouth at bedtime daily.   ? nitroglycerin (NITROSTAT) 0.4 mg tablet Place 0.4 mg under tongue every 5 minutes as needed for Chest Pain. Max of 3 tablets, call 911.   ? nystatin (MYCOSTATIN) 100,000 unit/g topical ointment Apply to affected areas twice daily as needed   ? oxyCODONE (ROXICODONE) 5 mg tablet Take one tablet to two tablets by mouth every 8 hours as needed for Pain   ? pantoprazole DR (PROTONIX) 40 mg tablet Take one tablet by mouth twice daily.   ? spironolactone (ALDACTONE) 25 mg tablet Take one tablet by mouth twice daily. Take with food.   ? tamsulosin (FLOMAX) 0.4 mg capsule TAKE (1) CAPSULE BY MOUTH ONCE DAILY.   ? zinc sulfate 220 mg (50 mg elemental zinc) capsule Take 220 mg by mouth daily.     Vitals:    04/02/20 1544   Weight: 136.1 kg (300 lb)   Height: 180.3 cm (5' 11)       Body mass index is 41.84 kg/m?[name]     Physical Exam  Constitutional:       Appearance: Normal appearance.   HENT:      Head: Normocephalic and atraumatic.   Pulmonary:      Comments: He is using mild accessory muscles the sternocleidomastoids as well as abdominal muscles, he is wearing oxygen with current saturations 94% on 4 L  Neurological:      General: No focal deficit present.      Mental Status: He is alert.   Psychiatric:         Mood and Affect: Mood normal.         Labwork reviewed:  Lab Results   Component Value Date/Time    HGBA1C 5.9 11/25/2019 02:45 PM    HGBA1C 5.9 09/02/2019 09:50 AM    HGBA1C 6.2 (A) 04/28/2019 12:00 AM    HGBPOC 11.2 (L) 10/25/2012 10:58 AM    HGBPOC 12.9 (L) 10/25/2012 08:55 AM    MCALBR 7.6 04/29/2019 12:00 AM    TSH 2.49 03/05/2020 08:40 PM    FREET4R 0.99 12/15/2014 09:12 AM    CHOL 106 12/05/2018 01:57 AM    TRIG 108 12/05/2018 01:57 AM    HDL 31 (L) 12/05/2018 01:57 AM    LDL 60 12/05/2018 01:57 AM    NA 138 03/10/2020 04:22 AM  K 4.5 03/10/2020 04:22 AM    CL 99 03/10/2020 04:22 AM    CO2 28 03/10/2020 04:22 AM    GAP 11 03/10/2020 04:22 AM    BUN 52 (H) 03/10/2020 04:22 AM    CR 1.46 (H) 03/10/2020 04:22 AM    GLU 125 (H) 03/10/2020 04:22 AM    CA 9.1 03/10/2020 04:22 AM    PO4 4.1 03/08/2020 03:51 AM    ALBUMIN 3.9 03/10/2020 04:22 AM    TOTPROT 6.2 03/10/2020 04:22 AM    ALKPHOS 59 03/10/2020 04:22 AM    AST 16 03/10/2020 04:22 AM    ALT 16 03/10/2020 04:22 AM    TOTBILI 0.8 03/10/2020 04:22 AM    GFR 44 01/30/2020 12:00 AM    GFRAA >60 12/03/2019 04:51 AM    PSA 2.96 11/25/2018 12:00 AM              Assessment and Plan:    1. Acute on chronic respiratory failure with hypoxia (HCC)    2. Other emphysema (HCC)    3. Bronchiectasis with acute lower respiratory infection (HCC)    4. Chronic combined systolic and diastolic congestive heart failure, NYHA class 2 (HCC)      Ed is being seen today for acute worsening of his shortness of breath and cough.  I am worried about another exacerbation of his underlying COPD/asthma.  Given his increased need for oxygen up to 4 L as well as his use of accessory muscles, I think our best course of action is to admit him inpatient for further work-up and treatment.  His overall presentation seems most consistent with a flare of his COPD, but he does have known history of chronic systolic and diastolic heart failure.  He does have a CardioMEMS in place and his readings have been stable.  As of late, his heart failure has been under stable control.    Total of 40 minutes were spent on the same day of the visit including preparing to see the patient, obtaining and/or reviewing separately obtained history, performing a medically appropriate examination and/or evaluation, counseling and educating the patient/family/caregiver, ordering medications, tests, or procedures, referring and communication with other health care professionals, documenting clinical information in the electronic or other health record, independently interpreting results and communicating results to the patient/family/caregiver, and care coordination.         There are no Patient Instructions on file for this visit.    No follow-ups on file.

## 2020-04-03 LAB — COMPREHENSIVE METABOLIC PANEL
Lab: 1.5 mg/dL — ABNORMAL HIGH (ref 0.4–1.24)
Lab: 101 MMOL/L — ABNORMAL LOW (ref 98–110)
Lab: 12 (ref 3–12)
Lab: 13 U/L (ref 7–40)
Lab: 14 U/L (ref 7–56)
Lab: 140 MMOL/L — ABNORMAL LOW (ref 137–147)
Lab: 152 mg/dL — ABNORMAL HIGH (ref 70–100)
Lab: 27 MMOL/L (ref 21–30)
Lab: 30 mg/dL — ABNORMAL HIGH (ref 7–25)
Lab: 45 mL/min — ABNORMAL LOW (ref >60)
Lab: 60 U/L (ref 25–110)

## 2020-04-03 MED ADMIN — ALBUTEROL SULFATE 2.5 MG /3 ML (0.083 %) IN NEBU [250]: 2.5 mg | RESPIRATORY_TRACT | @ 06:00:00 | Stop: 2020-04-03 | NDC 76204020001

## 2020-04-03 MED ADMIN — IPRATROPIUM-ALBUTEROL 0.5 MG-3 MG(2.5 MG BASE)/3 ML IN NEBU [77459]: 3 mL | RESPIRATORY_TRACT | @ 18:00:00 | Stop: 2020-04-03 | NDC 00487020101

## 2020-04-03 MED ADMIN — IPRATROPIUM-ALBUTEROL 0.5 MG-3 MG(2.5 MG BASE)/3 ML IN NEBU [77459]: 3 mL | RESPIRATORY_TRACT | @ 22:00:00 | NDC 00487020101

## 2020-04-03 MED ADMIN — ALBUTEROL SULFATE 2.5 MG /3 ML (0.083 %) IN NEBU [250]: 2.5 mg | RESPIRATORY_TRACT | @ 14:00:00 | Stop: 2020-04-03 | NDC 76204020001

## 2020-04-03 NOTE — Progress Notes
RT Adult Assessment Note    NAME:Ruben Wood.             MRN: [id number]             DOB:[birth year]          AGE: 80 y.o.  ADMISSION DATE: 04/02/2020             DAYS ADMITTED: LOS: 0 days    RT Treatment Plan:  Protocol Plan: Medications  Albuterol: Nebulizer Q4h while awake & PRN;MDI PRN  Ipratropium: Nebulizer Q4h while awake & PRN  Brovana: BID  Pulmicort (Home regimen only): BID    Protocol Plan: Procedures  PAP: Place a nursing order for "IS Q1h While Awake" for any of Lung Expansion indicators  Oxygen/Humidity: O2 to keep SpO2 > 92%, if not on any RT modality, D/C protocol if greater than 24 hours on room air  SpO2: Continuous (Document SpO2 result Qshift)  Comment: home O2 2-4 lpm. Bronchiectasis    Additional Comments:  Impressions of the patient: pt eating dinner in chair  Intervention(s)/outcome(s): set up home cpap  Patient education that was completed: n/a  Recommendations to the care team: Pt home reg put in. Pt exempt from RT protocol due to bronchiectasis    Vital Signs:  Pulse: 95  RR: 18 PER MINUTE  SpO2: 93 %  O2 Device: Nasal cannula  Liter Flow: (S) 2 Lpm (SpO2 was 88 on RA)  O2%:    Breath Sounds:    Respiratory Effort: SOA (Short of Air)

## 2020-04-03 NOTE — H&P (View-Only)
Name:  Ruben Wood.                                             MRN:  [id number]   Admission Date:  04/02/2020                     Assessment/Plan:      Asthma/COPD exacerbation  Acute on chronic hypoxic respiratory failure  - Follows with Dr. Lambert Mody from Pulmonary  - admitted from pcp clinic with cc of productive cough, dyspnea, and increased oxygen needs for few days  - admission COVID pending  - procalcitonin pending  - sputum culture pending  - chest xray xray pending  - started on steroids and doxy   - continue PTA inhalers   - pulmonary consulted    Chronic combined heart failure  Ischemic cardiomyopathy  SSS s/p ICD  Atrial fibrillation  CAD  - Per Cardiology not on [facility] for Afib (although unclear why). 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 Chales Abrahams and was seen on?01/15/2019?with a CT of the abdomen the same day revealing aneurysmal sac size continues to decrease, now measuring 4.2?cm, previously 5 cm one year prior. Recommended repeat CT 2 years.  ?  Morbid obesity  - BMI of 41.84    Severe OSA  - CPAP qhs  ______________________________________________________________________________    Primary Care Physician: Comfort, [name]     Chief Complaint: dyspnea, cough     History of Present Illness: Ruben Wood. is a 80 y.o. male with PMH of as below admitted from pcp clinic with cc of productive cough, dyspnea, and increased oxygen needs for few days, his last admission was on 03/05/2020-03/10/2020 for similar complaints, he was managed for asthma/copd and bronchiectasis exacerbation with steroids and abx. He denies any fever and chills, no chest pain. He has been using his inhalers wo any improvement.     Medical History:   Diagnosis Date   ? AAA (abdominal aortic aneurysm) (HCC) 10/15/2012   ? Asthma    ? BPH with obstruction/lower urinary tract symptoms    ? Bronchiectasis (HCC)    ? CAD (coronary artery disease), native coronary artery 08/15/2012    07/11/2002- Cath @ [facility] showed Severe double vessel disease(OMB of circumflex and distal circ)  inferior-basilar dyskinesis.                 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  09/12/12   Cath    ? Cardiomyopathy (HCC) 07/19/2014   ? Cellulitis of left lower extremity 11/12/2014    10/2014: admitted with cellulitis, [place] negative for venous clot, MRI without osteomyelitis.    11/12/2014 In clinic today, still with cellulitis.  Per patient and his wife, the erythema may be extending.  Cultures from drainage in hospital with MSSA, but Blood Cx negative.  No e/o  osteomyelitis.  My concern is that this antibiotic regimen is not adequately treating his cellulitis.  We will broaden coverage to get MRSA with doxycycline.  Patient and wife given strict call/return criteria.  I will see patient in 3-4 days for re-evaluation.  No fever today.  Plan:  Stop cefpodoxime and start doxycylcine  11/17/2014 Much better, no warmth and erythema minimal.   Plan: continue doxycyline for full 10 day course.      ? Chest wall deformity 07/09/2012    Chest wall reconstruction on 07/09/12    ? CHF (congestive heart failure) (HCC)    ? 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 Chronic cough 08/29/2012   ? 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 COPD (chronic obstructive pulmonary disease) (HCC) 08/13/2012   ? Dyspnea    ? Edema    ? GERD (gastroesophageal reflux disease) 08/13/2012   ? History of CHF (congestive heart failure) 08/13/2012   ? History of MRSA infection 10/14/2012   ? 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    ? Hypertension 08/13/2012   ? 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 Morbid obesity (HCC) 08/13/2012   ? On supplemental oxygen therapy    ? OSA on CPAP 08/13/2012   ? Pneumonia 04/2012    [facility]   ? Seasonal allergic reaction      Surgical History:   Procedure Laterality Date   ? LUNG SURGERY  2015    Bio bridge in left lung/rib cage was broken  from coughing   ? BACK SURGERY  Jan 2016   ? Right Heart Catheterization Right 11/06/2014    Performed by Cath, Physician at [facility] CATH LAB   ? CARDIAC DEFIBRILLATOR PLACEMENT  2017   ? Right Heart Catheterization With Insertion Pulmonary Artery Sensor Right 03/30/2015    Performed by Harley Alto, MD at [facility] CATH LAB   ? Insert CRT-D and Leads Right 11/25/2015    Performed by Deniece Ree, MD at [facility] EP LAB   ? Fluoroscopy N/A 11/25/2015    Performed by Deniece Ree, MD at [facility] EP LAB   ? Defibrillation Threshold Testing at ICD Implant N/A 11/25/2015    Performed by Deniece Ree, MD at [facility] EP LAB   ? VARICOSE VEIN SURGERY Left 01/05/2016    left small saphenous endovenous ablation with left leg microphlebectomy-Dr. Junita Push   ? HX MICROPHLEBECTOMY Right 07/06/2016    [place]   ? HX ENDOVENOUS ABLATION OF THE SMALL SAPHENOUS VEIN Right 07/06/2016    [place]spiger   ? ANGIOGRAPHY CORONARY ARTERY WITH LEFT HEART CATHETERIZATION N/A 04/02/2017    Performed by Nat Math, MD at [facility] CATH LAB   ? PERCUTANEOUS CORONARY STENT PLACEMENT WITH ANGIOPLASTY N/A 04/02/2017    Performed by Nat Math, MD at [facility] CATH LAB   ? PERCUTANEOUS CORONARY STENT PLACEMENT WITH ANGIOPLASTY N/A 06/29/2017    Performed by Nat Math, MD at [facility] CATH LAB   ?  LEFT ULNAR NERVE DECOMPRESSION AT ELBOW Left 04/30/2018    Performed by Letitia Neri, MD at [facility] OR   ? BRONCHOSCOPY     ? CARDIAC CATHERIZATION  several years ago    [place]   ? DOPPLER ECHOCARDIOGRAPHY     ? HERNIA REPAIR     ? HX CHOLECYSTECTOMY     ? HX HEART CATHETERIZATION     ? HX LUMBAR LAMINECTOMY     ? HX PACEMAKER PLACEMENT       Family History   Problem Relation Age of Onset   ? Cancer Mother         liver   ? Stroke Sister    ? Cancer Father    ? Coronary Artery Disease Father    ? None Reported Sister      Social History     Socioeconomic History   ? Marital status: Married     Spouse name: [name]   ? Number of children: 0   ? Years of education: Not on file   ? Highest education level: Not on file   Occupational History   ? Occupation: former Therapist, music: RETIRED     Comment: lives in a 81 year old house with wife   Tobacco Use   ? Smoking status: Former Smoker     Packs/day: 2.00     Years: 40.00     Pack years: 80.00     Types: Cigarettes     Quit date: 07/09/1998     Years since quitting: 21.7   ? Smokeless tobacco: Never Used   Vaping Use   ? Vaping Use: Never used   Substance and Sexual Activity   ? Alcohol use: Not Currently   ? Drug use: Never   ? Sexual activity: Not Currently   Other Topics Concern   ? Not on file   Social History Narrative   ? Not on file      Immunizations (includes history and patient reported):   Immunization History   Administered Date(s) Administered   ? COVID-19 (MODERNA), mRNA vacc, 100 mcg/0.5 mL (PF) 04/10/2019, 05/09/2019   ? FLU VACCINE >3YO (Preservative Free) 02/07/1999, 11/30/2008, 12/20/2010   ? Flu Vaccine =>65 YO High-Dose (PF) 11/09/2015, 11/13/2016, 12/18/2017   ? Flu Vaccine =>65 YO High-Dose Quadrivalent (PF) 12/03/2019   ? Flu Vaccine Quadrivalent Recombinant =>18 YO PF 02/07/2019   ? Flu Vaccine Trivalent >64 Yo High-dose (Preservative Free) 11/21/2013, 12/04/2014   ? Pneumococcal Vaccine (23-Val Adult) 02/19/2006, 09/11/2006, 04/27/2016   ? Pneumococcal Vaccine(13-Val Peds/immunocompromised adult) 05/11/2012, 12/04/2014   ? Varicella-Zoster Vaccine - live (ZOSTAVAX) 12/27/2012           Allergies:  Chlorhexidine    Medications:  Medications Prior to Admission   Medication Sig   ? acetaminophen (TYLENOL) 325 mg tablet Take two tablets by mouth every 6 hours as needed for Pain.   ? albuterol 0.083% (PROVENTIL) 2.5 mg /3 mL (0.083 %) nebulizer solution Inhale 3 mL solution by nebulizer as directed every 6 hours as needed for Wheezing or Shortness of Breath. Indications: asthma   ? albuterol sulfate (PROAIR HFA) 90 mcg/actuation HFA aerosol inhaler Inhale two puffs by mouth into the lungs every 4 hours as needed for Wheezing or Shortness of Breath.   ? allopurinoL (ZYLOPRIM) 300 mg tablet Take 300 mg by mouth daily. Take with food.   ? AMITR/GABAPEN/EMU OIL 05/24/08% CREAM (COMPOUND) Apply topically to affected area three times daily  as needed.   ? arformoteroL (BROVANA) 15 mcg/2 mL nebulizer solution Inhale 2 mL solution by nebulizer as directed twice daily. Dx: J44.9   ? ASCORBIC ACID-ASCORBATE SODIUM PO Take 500 mg by mouth every morning.   ? aspirin EC 81 mg tablet Take one tablet by mouth daily. Take with food.   ? atorvastatin (LIPITOR) 40 mg tablet Take one tablet by mouth daily.   ? benzonatate (TESSALON PERLES) 100 mg capsule Take one capsule by mouth every 8 hours.   ? budesonide (PULMICORT) 0.25 mg/2 mL nebulizer solution Inhale 2 mL solution by nebulizer as directed twice daily. Dx: J44.9   ? bumetanide (BUMEX) 2 mg tablet Take one tablet by mouth twice daily.   ? cholecalciferol (VITAMIN D-3) 1,000 units tablet Take 1,000 Units by mouth daily.   ? duloxetine DR (CYMBALTA) 60 mg capsule Take one capsule by mouth daily.   ? empagliflozin (JARDIANCE) 25 mg tablet Take one tablet by mouth daily.   ? finasteride (PROSCAR) 5 mg tablet Take one tablet by mouth daily.   ? fish oil- omega 3-DHA/EPA 300/1,000 mg capsule Take 1 capsule by mouth daily.   ? gabapentin (NEURONTIN) 100 mg capsule Take one capsule by mouth every 8 hours.   ? ipratropium bromide (ATROVENT) 21 mcg (0.03 %) nasal spray Apply two sprays to each nostril as directed every 12 hours.   ? Miscellaneous Medical Supply misc Rx: Please wrap legs with ACE bandage from toes as high up to the leg as possible bilaterally  Dx: Lymphedema   ? montelukast (SINGULAIR) 10 mg tablet Take one tablet by mouth at bedtime daily.   ? nitroglycerin (NITROSTAT) 0.4 mg tablet Place 0.4 mg under tongue every 5 minutes as needed for Chest Pain. Max of 3 tablets, call 911.   ? nystatin (MYCOSTATIN) 100,000 unit/g topical ointment Apply to affected areas twice daily as needed   ? oxyCODONE (ROXICODONE) 5 mg tablet Take one tablet to two tablets by mouth every 8 hours as needed for Pain   ? pantoprazole DR (PROTONIX) 40 mg tablet Take one tablet by mouth twice daily.   ? spironolactone (ALDACTONE) 25 mg tablet Take one tablet by mouth twice daily. Take with food.   ? tamsulosin (FLOMAX) 0.4 mg capsule TAKE (1) CAPSULE BY MOUTH ONCE DAILY.   ? zinc sulfate 220 mg (50 mg elemental zinc) capsule Take 220 mg by mouth daily.     Review of Systems:  A comprehensive  12 point review of organ systems reviewed and was negative except for the ones mentioned in HOPI    Physical Exam:  Vital Signs: Last Filed In 24 Hours Vital Signs: 24 Hour Range   BP: 144/57 (02/11 1946)  Temp: 36.7 ?C (98 ?F) (02/11 1946)  Pulse: 98 (02/11 1946)  Respirations: 18 PER MINUTE (02/11 1946)  SpO2: 91 % (02/11 1946)  Height: 180.3 cm (5' 11) (02/11 1544) BP: (144)/(57)   Temp:  [36.7 ?C (98 ?F)]   Pulse:  [98]   Respirations:  [18 PER MINUTE]   SpO2:  [91 %]           General:  Alert, awake, oriented x 3 , cooperative, no distress, appears stated age  Head:  Normocephalic, without obvious abnormality, atraumatic  Eyes:  Conjunctivae/corneas clear   Nose: Nares normal. Mucosa normal.  No drainage or sinus tenderness  Throat: Lips, mucosa and tongue normal  Neck:    Supple, symmetrical, trachea midline, no adenopathy, thyroid: no enlargement/tenderness/nodules  Lungs:  Clear to auscultation bilaterally  Heart:   Regular rate and rhythm, S1, S2 normal, no murmur  Abdomen:  Soft, non-tender.  Bowel sounds normal.  No masses.  No organomegaly.  Extremities: Extremities normal, atraumatic, no cyanosis or edema  Skin: Skin color, texture, turgor normal.    Lymph nodes:  Cervical, supraclavicular and axillary nodes normal  Neurologic: Non focal grossly    Lab/Radiology/Other Diagnostic Tests:  24-hour labs:  No results found for this visit on 04/02/20 (from the past 24 hour(s)).     Pertinent radiology reviewed.    No results found.

## 2020-04-04 MED ADMIN — LACTULOSE 10 GRAM/15 ML PO LIQUID GROUP [280001]: 20 g | ORAL | @ 21:00:00 | NDC 00121457715

## 2020-04-04 MED ADMIN — DAPAGLIFLOZIN 5 MG PO TAB [320171]: 10 mg | ORAL | @ 23:00:00 | NDC 00310620530

## 2020-04-04 MED ADMIN — SENNOSIDES-DOCUSATE SODIUM 8.6-50 MG PO TAB [40926]: 1 | ORAL | @ 03:00:00 | NDC 60687062211

## 2020-04-04 MED ADMIN — IPRATROPIUM-ALBUTEROL 0.5 MG-3 MG(2.5 MG BASE)/3 ML IN NEBU [77459]: 3 mL | RESPIRATORY_TRACT | @ 20:00:00 | NDC 00487020101

## 2020-04-04 MED ADMIN — BUMETANIDE 2 MG PO TAB [9311]: 2 mg | ORAL | @ 23:00:00 | NDC 50268013211

## 2020-04-04 MED ADMIN — IPRATROPIUM-ALBUTEROL 0.5 MG-3 MG(2.5 MG BASE)/3 ML IN NEBU [77459]: 3 mL | RESPIRATORY_TRACT | @ 23:00:00 | NDC 00487020101

## 2020-04-04 MED ADMIN — ENOXAPARIN 40 MG/0.4 ML SC SYRG [85052]: 40 mg | SUBCUTANEOUS | @ 03:00:00 | NDC 00781324602

## 2020-04-04 MED ADMIN — ENOXAPARIN 40 MG/0.4 ML SC SYRG [85052]: 40 mg | SUBCUTANEOUS | @ 15:00:00 | NDC 00781324602

## 2020-04-04 MED ADMIN — SENNOSIDES-DOCUSATE SODIUM 8.6-50 MG PO TAB [40926]: 1 | ORAL | @ 15:00:00 | NDC 60687062211

## 2020-04-04 MED ADMIN — BUMETANIDE 0.25 MG/ML IJ SOLN [9308]: 2 mg | INTRAVENOUS | @ 02:00:00 | NDC 00641600801

## 2020-04-04 MED ADMIN — IPRATROPIUM-ALBUTEROL 0.5 MG-3 MG(2.5 MG BASE)/3 ML IN NEBU [77459]: 3 mL | RESPIRATORY_TRACT | @ 15:00:00 | NDC 00487020101

## 2020-04-04 MED ADMIN — IPRATROPIUM-ALBUTEROL 0.5 MG-3 MG(2.5 MG BASE)/3 ML IN NEBU [77459]: 3 mL | RESPIRATORY_TRACT | @ 03:00:00 | NDC 00487020101

## 2020-04-04 MED ADMIN — BUMETANIDE 2 MG PO TAB [9311]: 2 mg | ORAL | @ 15:00:00 | NDC 50268013211

## 2020-04-04 MED ADMIN — GUAIFENESIN 600 MG PO TA12 [321343]: 600 mg | ORAL | @ 19:00:00 | NDC 68084057211

## 2020-04-05 MED ADMIN — IPRATROPIUM-ALBUTEROL 0.5 MG-3 MG(2.5 MG BASE)/3 ML IN NEBU [77459]: 3 mL | RESPIRATORY_TRACT | @ 23:00:00 | NDC 00378967131

## 2020-04-05 MED ADMIN — LACTULOSE 10 GRAM/15 ML PO LIQUID GROUP [280001]: 20 g | ORAL | @ 15:00:00 | Stop: 2020-04-05 | NDC 00121457715

## 2020-04-05 MED ADMIN — IPRATROPIUM-ALBUTEROL 0.5 MG-3 MG(2.5 MG BASE)/3 ML IN NEBU [77459]: 3 mL | RESPIRATORY_TRACT | @ 20:00:00 | NDC 00487020101

## 2020-04-05 MED ADMIN — IPRATROPIUM-ALBUTEROL 0.5 MG-3 MG(2.5 MG BASE)/3 ML IN NEBU [77459]: 3 mL | RESPIRATORY_TRACT | @ 03:00:00 | NDC 00487020101

## 2020-04-05 MED ADMIN — IPRATROPIUM-ALBUTEROL 0.5 MG-3 MG(2.5 MG BASE)/3 ML IN NEBU [77459]: 3 mL | RESPIRATORY_TRACT | @ 15:00:00 | NDC 00487020101

## 2020-04-05 MED ADMIN — SENNOSIDES-DOCUSATE SODIUM 8.6-50 MG PO TAB [40926]: 1 | ORAL | @ 04:00:00 | NDC 60687062211

## 2020-04-05 MED ADMIN — SENNOSIDES-DOCUSATE SODIUM 8.6-50 MG PO TAB [40926]: 1 | ORAL | @ 16:00:00 | NDC 60687062211

## 2020-04-05 MED ADMIN — GUAIFENESIN 600 MG PO TA12 [321343]: 600 mg | ORAL | @ 04:00:00 | NDC 68084057211

## 2020-04-05 MED ADMIN — ENOXAPARIN 40 MG/0.4 ML SC SYRG [85052]: 40 mg | SUBCUTANEOUS | @ 04:00:00 | NDC 00781324602

## 2020-04-05 MED ADMIN — LACTULOSE 10 GRAM/15 ML PO LIQUID GROUP [280001]: 20 g | ORAL | @ 19:00:00 | NDC 00121457715

## 2020-04-05 MED ADMIN — DAPAGLIFLOZIN 5 MG PO TAB [320171]: 10 mg | ORAL | @ 15:00:00 | NDC 00310620530

## 2020-04-05 MED ADMIN — BUMETANIDE 2 MG PO TAB [9311]: 2 mg | ORAL | @ 19:00:00 | Stop: 2020-04-05 | NDC 50268013211

## 2020-04-05 MED ADMIN — LACTULOSE 10 GRAM/15 ML PO LIQUID GROUP [280001]: 20 g | ORAL | @ 04:00:00 | NDC 00121457715

## 2020-04-05 MED ADMIN — GUAIFENESIN 600 MG PO TA12 [321343]: 600 mg | ORAL | @ 16:00:00 | NDC 68084057211

## 2020-04-05 MED ADMIN — ENOXAPARIN 40 MG/0.4 ML SC SYRG [85052]: 40 mg | SUBCUTANEOUS | @ 15:00:00 | Stop: 2020-04-05 | NDC 00781324602

## 2020-04-05 NOTE — Telephone Encounter
[name], pt's spouse, LVM stating Ed is currently in the hospital and having trouble with afib.  [name] states she was told to get in touch with Dr. Jacqulyn Ducking.    I called [name] back but the connection was bad and I could not hear what she was saying.  Will wait on call back from [place].

## 2020-04-06 MED ADMIN — IPRATROPIUM-ALBUTEROL 0.5 MG-3 MG(2.5 MG BASE)/3 ML IN NEBU [77459]: 3 mL | RESPIRATORY_TRACT | @ 20:00:00 | NDC 00487020101

## 2020-04-06 MED ADMIN — IPRATROPIUM-ALBUTEROL 0.5 MG-3 MG(2.5 MG BASE)/3 ML IN NEBU [77459]: 3 mL | RESPIRATORY_TRACT | @ 23:00:00 | NDC 00487020101

## 2020-04-06 MED ADMIN — LACTULOSE 10 GRAM/15 ML PO LIQUID GROUP [280001]: 20 g | ORAL | @ 20:00:00 | NDC 00121457715

## 2020-04-06 MED ADMIN — GUAIFENESIN 600 MG PO TA12 [321343]: 600 mg | ORAL | @ 15:00:00 | NDC 68084057211

## 2020-04-06 MED ADMIN — CODEINE-GUAIFENESIN 10-100 MG/5 ML PO LIQD [36663]: 10 mL | ORAL | @ 18:00:00 | NDC 00121177505

## 2020-04-06 MED ADMIN — SENNOSIDES-DOCUSATE SODIUM 8.6-50 MG PO TAB [40926]: 1 | ORAL | @ 15:00:00 | NDC 60687062211

## 2020-04-06 MED ADMIN — IPRATROPIUM-ALBUTEROL 0.5 MG-3 MG(2.5 MG BASE)/3 ML IN NEBU [77459]: 3 mL | RESPIRATORY_TRACT | @ 02:00:00 | NDC 00487020101

## 2020-04-06 MED ADMIN — APIXABAN 2.5 MG PO TAB [315777]: 2.5 mg | ORAL | @ 03:00:00 | NDC 00003089331

## 2020-04-06 MED ADMIN — LACTULOSE 10 GRAM/15 ML PO LIQUID GROUP [280001]: 20 g | ORAL | @ 03:00:00 | NDC 00121457715

## 2020-04-06 MED ADMIN — DAPAGLIFLOZIN 5 MG PO TAB [320171]: 10 mg | ORAL | @ 15:00:00 | NDC 00310620530

## 2020-04-06 MED ADMIN — IPRATROPIUM-ALBUTEROL 0.5 MG-3 MG(2.5 MG BASE)/3 ML IN NEBU [77459]: 3 mL | RESPIRATORY_TRACT | @ 15:00:00 | NDC 00487020101

## 2020-04-06 MED ADMIN — SENNOSIDES-DOCUSATE SODIUM 8.6-50 MG PO TAB [40926]: 1 | ORAL | @ 03:00:00 | NDC 60687062211

## 2020-04-06 MED ADMIN — GUAIFENESIN 600 MG PO TA12 [321343]: 600 mg | ORAL | @ 03:00:00 | NDC 68084057211

## 2020-04-06 MED ADMIN — BUMETANIDE 2 MG PO TAB [9311]: 2 mg | ORAL | @ 15:00:00 | NDC 50268013211

## 2020-04-06 MED ADMIN — LACTULOSE 10 GRAM/15 ML PO LIQUID GROUP [280001]: 20 g | ORAL | @ 14:00:00 | NDC 00121457715

## 2020-04-06 MED ADMIN — LACTULOSE 10 GRAM/15 ML PO LIQUID GROUP [280001]: 20 g | ORAL | NDC 00121457715

## 2020-04-06 MED ADMIN — APIXABAN 2.5 MG PO TAB [315777]: 2.5 mg | ORAL | @ 15:00:00 | NDC 00003089331

## 2020-04-07 MED ADMIN — IPRATROPIUM-ALBUTEROL 0.5 MG-3 MG(2.5 MG BASE)/3 ML IN NEBU [77459]: 3 mL | RESPIRATORY_TRACT | @ 20:00:00 | NDC 00487020101

## 2020-04-07 MED ADMIN — LACTULOSE 10 GRAM/15 ML PO LIQUID GROUP [280001]: 20 g | ORAL | @ 03:00:00 | NDC 00121457715

## 2020-04-07 MED ADMIN — DAPAGLIFLOZIN 5 MG PO TAB [320171]: 10 mg | ORAL | @ 15:00:00 | NDC 00310620530

## 2020-04-07 MED ADMIN — LACTULOSE 10 GRAM/15 ML PO LIQUID GROUP [280001]: 20 g | ORAL | NDC 00121457715

## 2020-04-07 MED ADMIN — APIXABAN 2.5 MG PO TAB [315777]: 2.5 mg | ORAL | @ 15:00:00 | NDC 00003089331

## 2020-04-07 MED ADMIN — LACTULOSE 10 GRAM/15 ML PO LIQUID GROUP [280001]: 20 g | ORAL | @ 15:00:00 | NDC 00121457715

## 2020-04-07 MED ADMIN — IPRATROPIUM-ALBUTEROL 0.5 MG-3 MG(2.5 MG BASE)/3 ML IN NEBU [77459]: 3 mL | RESPIRATORY_TRACT | @ 02:00:00 | NDC 00487020101

## 2020-04-07 MED ADMIN — SENNOSIDES-DOCUSATE SODIUM 8.6-50 MG PO TAB [40926]: 1 | ORAL | @ 15:00:00 | NDC 60687062211

## 2020-04-07 MED ADMIN — PREDNISONE 20 MG PO TAB [6496]: 40 mg | ORAL | @ 15:00:00 | NDC 00054001820

## 2020-04-07 MED ADMIN — SENNOSIDES-DOCUSATE SODIUM 8.6-50 MG PO TAB [40926]: 1 | ORAL | @ 03:00:00 | NDC 60687062211

## 2020-04-07 MED ADMIN — BUMETANIDE 2 MG PO TAB [9311]: 2 mg | ORAL | @ 15:00:00 | Stop: 2020-04-07 | NDC 50268013211

## 2020-04-07 MED ADMIN — GUAIFENESIN 600 MG PO TA12 [321343]: 600 mg | ORAL | @ 03:00:00 | NDC 68084057211

## 2020-04-07 MED ADMIN — CODEINE-GUAIFENESIN 10-100 MG/5 ML PO LIQD [36663]: 10 mL | ORAL | @ 03:00:00 | NDC 00121177505

## 2020-04-07 MED ADMIN — APIXABAN 2.5 MG PO TAB [315777]: 2.5 mg | ORAL | @ 03:00:00 | NDC 00003089331

## 2020-04-07 MED ADMIN — CODEINE-GUAIFENESIN 10-100 MG/5 ML PO LIQD [36663]: 10 mL | ORAL | @ 19:00:00 | NDC 00121177505

## 2020-04-07 MED ADMIN — GUAIFENESIN 600 MG PO TA12 [321343]: 600 mg | ORAL | @ 15:00:00 | NDC 68084057211

## 2020-04-07 MED ADMIN — BUMETANIDE 1 MG PO TAB [9310]: 1 mg | ORAL | @ 19:00:00 | Stop: 2020-04-07 | NDC 60687038495

## 2020-04-07 MED ADMIN — LACTULOSE 10 GRAM/15 ML PO LIQUID GROUP [280001]: 20 g | ORAL | @ 20:00:00 | NDC 00121457715

## 2020-04-07 MED ADMIN — INSULIN ASPART 100 UNIT/ML SC FLEXPEN [87504]: 4 [IU] | SUBCUTANEOUS | NDC 00169633910

## 2020-04-07 MED ADMIN — IPRATROPIUM-ALBUTEROL 0.5 MG-3 MG(2.5 MG BASE)/3 ML IN NEBU [77459]: 3 mL | RESPIRATORY_TRACT | NDC 00487020101

## 2020-04-07 MED ADMIN — IPRATROPIUM-ALBUTEROL 0.5 MG-3 MG(2.5 MG BASE)/3 ML IN NEBU [77459]: 3 mL | RESPIRATORY_TRACT | @ 16:00:00 | NDC 00487020101

## 2020-04-07 MED ADMIN — BUMETANIDE 2 MG PO TAB [9311]: 2 mg | ORAL | NDC 50268013211

## 2020-04-08 MED ADMIN — IPRATROPIUM-ALBUTEROL 0.5 MG-3 MG(2.5 MG BASE)/3 ML IN NEBU [77459]: 3 mL | RESPIRATORY_TRACT | @ 02:00:00 | NDC 00487020101

## 2020-04-08 MED ADMIN — SENNOSIDES-DOCUSATE SODIUM 8.6-50 MG PO TAB [40926]: 1 | ORAL | @ 03:00:00 | NDC 60687062211

## 2020-04-08 MED ADMIN — BUMETANIDE 1 MG PO TAB [9310]: 3 mg | ORAL | NDC 00904701604

## 2020-04-08 MED ADMIN — IPRATROPIUM-ALBUTEROL 0.5 MG-3 MG(2.5 MG BASE)/3 ML IN NEBU [77459]: 3 mL | RESPIRATORY_TRACT | @ 07:00:00 | Stop: 2020-04-08 | NDC 00487020101

## 2020-04-08 MED ADMIN — APIXABAN 2.5 MG PO TAB [315777]: 2.5 mg | ORAL | @ 16:00:00 | NDC 00003089331

## 2020-04-08 MED ADMIN — PREDNISONE 20 MG PO TAB [6496]: 40 mg | ORAL | @ 16:00:00 | NDC 60687014511

## 2020-04-08 MED ADMIN — BUMETANIDE 2 MG PO TAB [9311]: 3 mg | ORAL | @ 23:00:00 | NDC 50268013211

## 2020-04-08 MED ADMIN — FUROSEMIDE 10 MG/ML IJ SOLN [3291]: 100 mg | INTRAVENOUS | @ 13:00:00 | Stop: 2020-04-08 | NDC 55150032301

## 2020-04-08 MED ADMIN — IPRATROPIUM-ALBUTEROL 0.5 MG-3 MG(2.5 MG BASE)/3 ML IN NEBU [77459]: 3 mL | RESPIRATORY_TRACT | @ 23:00:00 | Stop: 2020-04-08 | NDC 00378967131

## 2020-04-08 MED ADMIN — VANCOMYCIN 5 GRAM IV SOLR [8444]: 2500 mg | INTRAVENOUS | @ 17:00:00 | Stop: 2020-04-08 | NDC 63323029561

## 2020-04-08 MED ADMIN — IPRATROPIUM-ALBUTEROL 0.5 MG-3 MG(2.5 MG BASE)/3 ML IN NEBU [77459]: 3 mL | RESPIRATORY_TRACT | @ 17:00:00 | Stop: 2020-04-08 | NDC 00378967131

## 2020-04-08 MED ADMIN — GABAPENTIN 400 MG PO CAP [18307]: 400 mg | ORAL | @ 16:00:00 | NDC 00904666761

## 2020-04-08 MED ADMIN — SODIUM CHLORIDE 0.9 % IV SOLP [27838]: 2500 mg | INTRAVENOUS | @ 17:00:00 | Stop: 2020-04-08 | NDC 00338004903

## 2020-04-08 MED ADMIN — FUROSEMIDE 10 MG/ML IJ SOLN [3291]: 100 mg | INTRAVENOUS | @ 13:00:00 | Stop: 2020-04-08 | NDC 63323028001

## 2020-04-08 MED ADMIN — LACTULOSE 10 GRAM/15 ML PO LIQUID GROUP [280001]: 20 g | ORAL | @ 03:00:00 | NDC 00121457715

## 2020-04-08 MED ADMIN — GABAPENTIN 100 MG PO CAP [18309]: 400 mg | ORAL | @ 03:00:00 | NDC 67877022205

## 2020-04-08 MED ADMIN — CODEINE-GUAIFENESIN 10-100 MG/5 ML PO LIQD [36663]: 10 mL | ORAL | @ 03:00:00 | NDC 50383008705

## 2020-04-08 MED ADMIN — GUAIFENESIN 600 MG PO TA12 [321343]: 600 mg | ORAL | @ 03:00:00 | NDC 68084057211

## 2020-04-08 MED ADMIN — LACTULOSE 10 GRAM/15 ML PO LIQUID GROUP [280001]: 20 g | ORAL | @ 23:00:00 | NDC 00121115430

## 2020-04-08 MED ADMIN — DAPAGLIFLOZIN 5 MG PO TAB [320171]: 10 mg | ORAL | @ 17:00:00 | NDC 00310620530

## 2020-04-08 MED ADMIN — IPRATROPIUM-ALBUTEROL 0.5 MG-3 MG(2.5 MG BASE)/3 ML IN NEBU [77459]: 3 mL | RESPIRATORY_TRACT | @ 20:00:00 | Stop: 2020-04-08 | NDC 00378967131

## 2020-04-08 MED ADMIN — LACTULOSE 10 GRAM/15 ML PO LIQUID GROUP [280001]: 20 g | ORAL | @ 16:00:00 | NDC 00121115430

## 2020-04-08 MED ADMIN — APIXABAN 2.5 MG PO TAB [315777]: 2.5 mg | ORAL | @ 03:00:00 | NDC 00003089331

## 2020-04-08 MED ADMIN — BUMETANIDE 0.25 MG/ML IJ SOLN [9308]: 3 mg | INTRAVENOUS | @ 08:00:00 | Stop: 2020-04-08 | NDC 00641600801

## 2020-04-08 MED ADMIN — LACTULOSE 10 GRAM/15 ML PO LIQUID GROUP [280001]: 20 g | ORAL | NDC 00121457715

## 2020-04-08 MED ADMIN — SODIUM CHLORIDE 0.65 % NA SPRA [29676]: 2 | NASAL | @ 16:00:00 | NDC 00904386575

## 2020-04-08 MED ADMIN — GUAIFENESIN 600 MG PO TA12 [321343]: 600 mg | ORAL | @ 16:00:00 | NDC 68084057211

## 2020-04-08 MED ADMIN — SENNOSIDES-DOCUSATE SODIUM 8.6-50 MG PO TAB [40926]: 2 | ORAL | @ 17:00:00 | NDC 60687062211

## 2020-04-08 MED ADMIN — FUROSEMIDE 10 MG/ML IJ SOLN [3291]: 100 mg | INTRAVENOUS | @ 13:00:00 | Stop: 2020-04-08 | NDC 70860030242

## 2020-04-08 MED ADMIN — BUMETANIDE 2 MG PO TAB [9311]: 3 mg | ORAL | NDC 50268013211

## 2020-04-08 MED ADMIN — GABAPENTIN 300 MG PO CAP [18308]: 400 mg | ORAL | @ 03:00:00 | NDC 67877022305

## 2020-04-08 MED ADMIN — CEFEPIME 2 GRAM IJ SOLR [78195]: 2 g | INTRAVENOUS | @ 18:00:00 | NDC 60505614700

## 2020-04-08 MED ADMIN — INSULIN ASPART 100 UNIT/ML SC FLEXPEN [87504]: 1 [IU] | SUBCUTANEOUS | @ 18:00:00 | NDC 00169633910

## 2020-04-08 MED ADMIN — LACTULOSE 10 GRAM/15 ML PO LIQUID GROUP [280001]: 20 g | ORAL | @ 20:00:00 | NDC 00121115430

## 2020-04-08 MED ADMIN — SODIUM CHLORIDE 0.9 % IV PGBK (MB+) [95161]: 2 g | INTRAVENOUS | @ 18:00:00 | NDC 00338915930

## 2020-04-08 MED ADMIN — BUMETANIDE 1 MG PO TAB [9310]: 3 mg | ORAL | @ 23:00:00 | NDC 00904701604

## 2020-04-08 MED ADMIN — POLYETHYLENE GLYCOL 3350 17 GRAM PO PWPK [25424]: 34 g | ORAL | @ 17:00:00 | NDC 00904693186

## 2020-04-08 MED FILL — BUDESONIDE 0.25 MG/2 ML IN NBSP: 0.25 mg/2 mL | RESPIRATORY_TRACT | 30 days supply | Qty: 120 | Fill #1 | Status: AC

## 2020-04-09 MED ADMIN — PREDNISONE 20 MG PO TAB [6496]: 40 mg | ORAL | @ 14:00:00 | NDC 00054001820

## 2020-04-09 MED ADMIN — SENNOSIDES-DOCUSATE SODIUM 8.6-50 MG PO TAB [40926]: 2 | ORAL | @ 14:00:00 | NDC 60687062211

## 2020-04-09 MED ADMIN — LACTULOSE 10 GRAM/15 ML PO LIQUID GROUP [280001]: 20 g | ORAL | @ 19:00:00 | NDC 00121115430

## 2020-04-09 MED ADMIN — DAPAGLIFLOZIN 5 MG PO TAB [320171]: 10 mg | ORAL | @ 14:00:00 | NDC 00310620530

## 2020-04-09 MED ADMIN — IPRATROPIUM-ALBUTEROL 0.5 MG-3 MG(2.5 MG BASE)/3 ML IN NEBU [77459]: 3 mL | RESPIRATORY_TRACT | @ 06:00:00 | NDC 00378967131

## 2020-04-09 MED ADMIN — APIXABAN 2.5 MG PO TAB [315777]: 2.5 mg | ORAL | @ 03:00:00 | NDC 00003089331

## 2020-04-09 MED ADMIN — LACTULOSE 10 GRAM/15 ML PO LIQUID GROUP [280001]: 20 g | ORAL | @ 03:00:00 | NDC 00121115430

## 2020-04-09 MED ADMIN — CEFEPIME 2 GRAM IJ SOLR [78195]: 2 g | INTRAVENOUS | @ 17:00:00 | NDC 00409973501

## 2020-04-09 MED ADMIN — IPRATROPIUM-ALBUTEROL 0.5 MG-3 MG(2.5 MG BASE)/3 ML IN NEBU [77459]: 3 mL | RESPIRATORY_TRACT | @ 10:00:00 | NDC 00378967131

## 2020-04-09 MED ADMIN — LACTULOSE 10 GRAM/15 ML PO LIQUID GROUP [280001]: 20 g | ORAL | @ 14:00:00 | NDC 00121115430

## 2020-04-09 MED ADMIN — SODIUM CHLORIDE 0.9 % IV PGBK (MB+) [95161]: 2 g | INTRAVENOUS | @ 04:00:00 | NDC 00338915930

## 2020-04-09 MED ADMIN — SENNOSIDES-DOCUSATE SODIUM 8.6-50 MG PO TAB [40926]: 2 | ORAL | @ 03:00:00 | NDC 60687062211

## 2020-04-09 MED ADMIN — SODIUM CHLORIDE 0.9 % IV PGBK (MB+) [95161]: 2 g | INTRAVENOUS | @ 17:00:00 | NDC 00338915930

## 2020-04-09 MED ADMIN — LACTULOSE 10 GRAM/15 ML PO LIQUID GROUP [280001]: 20 g | ORAL | @ 23:00:00 | NDC 00121115430

## 2020-04-09 MED ADMIN — IPRATROPIUM-ALBUTEROL 0.5 MG-3 MG(2.5 MG BASE)/3 ML IN NEBU [77459]: 3 mL | RESPIRATORY_TRACT | @ 23:00:00 | NDC 00378967131

## 2020-04-09 MED ADMIN — IPRATROPIUM-ALBUTEROL 0.5 MG-3 MG(2.5 MG BASE)/3 ML IN NEBU [77459]: 3 mL | RESPIRATORY_TRACT | @ 02:00:00 | NDC 00378967131

## 2020-04-09 MED ADMIN — IPRATROPIUM-ALBUTEROL 0.5 MG-3 MG(2.5 MG BASE)/3 ML IN NEBU [77459]: 3 mL | RESPIRATORY_TRACT | @ 19:00:00 | NDC 00378967131

## 2020-04-09 MED ADMIN — POLYETHYLENE GLYCOL 3350 17 GRAM PO PWPK [25424]: 34 g | ORAL | @ 03:00:00 | NDC 00904693186

## 2020-04-09 MED ADMIN — GABAPENTIN 400 MG PO CAP [18307]: 400 mg | ORAL | @ 14:00:00 | NDC 00904666761

## 2020-04-09 MED ADMIN — IPRATROPIUM-ALBUTEROL 0.5 MG-3 MG(2.5 MG BASE)/3 ML IN NEBU [77459]: 3 mL | RESPIRATORY_TRACT | @ 15:00:00 | NDC 00378967131

## 2020-04-09 MED ADMIN — POTASSIUM CHLORIDE 20 MEQ/15 ML PO LIQD [6432]: 40 meq | ORAL | @ 14:00:00 | Stop: 2020-04-09 | NDC 00121252030

## 2020-04-09 MED ADMIN — APIXABAN 2.5 MG PO TAB [315777]: 2.5 mg | ORAL | @ 14:00:00 | NDC 00003089331

## 2020-04-09 MED ADMIN — GABAPENTIN 400 MG PO CAP [18307]: 400 mg | ORAL | @ 03:00:00 | NDC 00904666761

## 2020-04-09 MED ADMIN — BUMETANIDE 1 MG PO TAB [9310]: 3 mg | ORAL | @ 23:00:00 | NDC 50268013111

## 2020-04-09 MED ADMIN — POLYETHYLENE GLYCOL 3350 17 GRAM PO PWPK [25424]: 34 g | ORAL | @ 14:00:00 | NDC 00904693186

## 2020-04-09 MED ADMIN — GUAIFENESIN 600 MG PO TA12 [321343]: 600 mg | ORAL | @ 14:00:00 | NDC 68084057211

## 2020-04-09 MED ADMIN — BUMETANIDE 0.5 MG PO TAB [9309]: 3 mg | ORAL | @ 14:00:00 | NDC 50268013011

## 2020-04-09 MED ADMIN — CEFEPIME 2 GRAM IJ SOLR [78195]: 2 g | INTRAVENOUS | @ 04:00:00 | NDC 60505614700

## 2020-04-09 MED ADMIN — BUMETANIDE 2 MG PO TAB [9311]: 3 mg | ORAL | @ 23:00:00 | NDC 50268013211

## 2020-04-09 MED ADMIN — GUAIFENESIN 600 MG PO TA12 [321343]: 600 mg | ORAL | @ 03:00:00 | NDC 68084057211

## 2020-04-10 MED ADMIN — GABAPENTIN 400 MG PO CAP [18307]: 400 mg | ORAL | @ 03:00:00 | NDC 00904666761

## 2020-04-10 MED ADMIN — PIPERACILLIN-TAZOBACTAM 4.5 GRAM IV SOLR [80419]: 4.5 g | INTRAVENOUS | @ 23:00:00 | NDC 60505615900

## 2020-04-10 MED ADMIN — CEFEPIME 2 GRAM IJ SOLR [78195]: 2 g | INTRAVENOUS | @ 03:00:00 | NDC 60505614700

## 2020-04-10 MED ADMIN — APIXABAN 2.5 MG PO TAB [315777]: 2.5 mg | ORAL | @ 15:00:00 | NDC 00003089331

## 2020-04-10 MED ADMIN — DAPAGLIFLOZIN 5 MG PO TAB [320171]: 10 mg | ORAL | @ 15:00:00 | NDC 00310620530

## 2020-04-10 MED ADMIN — CEFEPIME 2 GRAM IJ SOLR [78195]: 2 g | INTRAVENOUS | @ 15:00:00 | Stop: 2020-04-10 | NDC 60505614700

## 2020-04-10 MED ADMIN — IPRATROPIUM-ALBUTEROL 0.5 MG-3 MG(2.5 MG BASE)/3 ML IN NEBU [77459]: 3 mL | RESPIRATORY_TRACT | @ 19:00:00 | NDC 60687040579

## 2020-04-10 MED ADMIN — APIXABAN 2.5 MG PO TAB [315777]: 2.5 mg | ORAL | @ 03:00:00 | NDC 00003089331

## 2020-04-10 MED ADMIN — BUMETANIDE 0.5 MG PO TAB [9309]: 2 mg | ORAL | @ 23:00:00 | NDC 50268013011

## 2020-04-10 MED ADMIN — GUAIFENESIN 600 MG PO TA12 [321343]: 600 mg | ORAL | @ 03:00:00 | NDC 68084057211

## 2020-04-10 MED ADMIN — BUMETANIDE 0.5 MG PO TAB [9309]: 3 mg | ORAL | @ 15:00:00 | Stop: 2020-04-10 | NDC 50268013011

## 2020-04-10 MED ADMIN — IPRATROPIUM-ALBUTEROL 0.5 MG-3 MG(2.5 MG BASE)/3 ML IN NEBU [77459]: 3 mL | RESPIRATORY_TRACT | @ 15:00:00 | NDC 60687040579

## 2020-04-10 MED ADMIN — GABAPENTIN 400 MG PO CAP [18307]: 400 mg | ORAL | @ 15:00:00 | NDC 00904666761

## 2020-04-10 MED ADMIN — PIPERACILLIN-TAZOBACTAM 4.5 GRAM IV SOLR [80419]: 4.5 g | INTRAVENOUS | @ 17:00:00 | Stop: 2020-04-10 | NDC 60505615900

## 2020-04-10 MED ADMIN — PREDNISONE 20 MG PO TAB [6496]: 40 mg | ORAL | @ 15:00:00 | Stop: 2020-04-10 | NDC 00054001820

## 2020-04-10 MED ADMIN — SODIUM CHLORIDE 0.9 % IV PGBK (MB+) [95161]: 4.5 g | INTRAVENOUS | @ 23:00:00 | NDC 00338915930

## 2020-04-10 MED ADMIN — SODIUM CHLORIDE 0.9 % IV PGBK (MB+) [95161]: 4.5 g | INTRAVENOUS | @ 17:00:00 | Stop: 2020-04-10 | NDC 00338915930

## 2020-04-10 MED ADMIN — IPRATROPIUM-ALBUTEROL 0.5 MG-3 MG(2.5 MG BASE)/3 ML IN NEBU [77459]: 3 mL | RESPIRATORY_TRACT | @ 10:00:00 | NDC 60687040579

## 2020-04-10 MED ADMIN — SODIUM CHLORIDE 0.9 % IV PGBK (MB+) [95161]: 2 g | INTRAVENOUS | @ 03:00:00 | NDC 00338915930

## 2020-04-10 MED ADMIN — IPRATROPIUM-ALBUTEROL 0.5 MG-3 MG(2.5 MG BASE)/3 ML IN NEBU [77459]: 3 mL | RESPIRATORY_TRACT | @ 02:00:00 | NDC 60687040579

## 2020-04-10 MED ADMIN — SODIUM CHLORIDE 0.9 % IV PGBK (MB+) [95161]: 2 g | INTRAVENOUS | @ 15:00:00 | Stop: 2020-04-10 | NDC 00338915930

## 2020-04-10 MED ADMIN — IPRATROPIUM-ALBUTEROL 0.5 MG-3 MG(2.5 MG BASE)/3 ML IN NEBU [77459]: 3 mL | RESPIRATORY_TRACT | @ 06:00:00 | NDC 60687040579

## 2020-04-10 MED ADMIN — GUAIFENESIN 600 MG PO TA12 [321343]: 600 mg | ORAL | @ 15:00:00 | NDC 68084057211

## 2020-04-10 MED ADMIN — IPRATROPIUM-ALBUTEROL 0.5 MG-3 MG(2.5 MG BASE)/3 ML IN NEBU [77459]: 3 mL | RESPIRATORY_TRACT | @ 23:00:00 | NDC 60687040579

## 2020-04-10 MED ADMIN — LACTULOSE 10 GRAM/15 ML PO LIQUID GROUP [280001]: 20 g | ORAL | @ 03:00:00 | NDC 00121115430

## 2020-04-11 MED ADMIN — IPRATROPIUM-ALBUTEROL 0.5 MG-3 MG(2.5 MG BASE)/3 ML IN NEBU [77459]: 3 mL | RESPIRATORY_TRACT | @ 03:00:00 | NDC 00487020101

## 2020-04-11 MED ADMIN — SODIUM CHLORIDE 0.65 % NA SPRA [29676]: 2 | NASAL | @ 15:00:00 | NDC 00904386575

## 2020-04-11 MED ADMIN — SODIUM CHLORIDE 0.9 % IV PGBK (MB+) [95161]: 4.5 g | INTRAVENOUS | @ 11:00:00 | NDC 00338915930

## 2020-04-11 MED ADMIN — INSULIN ASPART 100 UNIT/ML SC FLEXPEN [87504]: 1 [IU] | SUBCUTANEOUS | NDC 00169633910

## 2020-04-11 MED ADMIN — IPRATROPIUM-ALBUTEROL 0.5 MG-3 MG(2.5 MG BASE)/3 ML IN NEBU [77459]: 3 mL | RESPIRATORY_TRACT | @ 07:00:00 | NDC 00487020101

## 2020-04-11 MED ADMIN — IPRATROPIUM-ALBUTEROL 0.5 MG-3 MG(2.5 MG BASE)/3 ML IN NEBU [77459]: 3 mL | RESPIRATORY_TRACT | @ 15:00:00 | NDC 00487020101

## 2020-04-11 MED ADMIN — GUAIFENESIN 600 MG PO TA12 [321343]: 600 mg | ORAL | @ 15:00:00 | NDC 68084057211

## 2020-04-11 MED ADMIN — POLYETHYLENE GLYCOL 3350 17 GRAM PO PWPK [25424]: 17 g | ORAL | @ 15:00:00 | NDC 00904693186

## 2020-04-11 MED ADMIN — CODEINE-GUAIFENESIN 10-100 MG/5 ML PO LIQD [36663]: 15 mL | ORAL | @ 20:00:00 | NDC 00121177505

## 2020-04-11 MED ADMIN — SODIUM CHLORIDE 3 % IN NEBU [7327]: 4 mL | RESPIRATORY_TRACT | @ 19:00:00 | Stop: 2020-04-11 | NDC 00487900360

## 2020-04-11 MED ADMIN — GUAIFENESIN 600 MG PO TA12 [321343]: 600 mg | ORAL | @ 04:00:00 | NDC 68084057211

## 2020-04-11 MED ADMIN — APIXABAN 2.5 MG PO TAB [315777]: 2.5 mg | ORAL | @ 15:00:00 | NDC 00003089331

## 2020-04-11 MED ADMIN — IPRATROPIUM-ALBUTEROL 0.5 MG-3 MG(2.5 MG BASE)/3 ML IN NEBU [77459]: 3 mL | RESPIRATORY_TRACT | @ 19:00:00 | NDC 00487020101

## 2020-04-11 MED ADMIN — PIPERACILLIN-TAZOBACTAM 4.5 GRAM IV SOLR [80419]: 4.5 g | INTRAVENOUS | @ 17:00:00 | NDC 60505615900

## 2020-04-11 MED ADMIN — GABAPENTIN 400 MG PO CAP [18307]: 400 mg | ORAL | @ 15:00:00 | NDC 00904666761

## 2020-04-11 MED ADMIN — DAPAGLIFLOZIN 5 MG PO TAB [320171]: 10 mg | ORAL | @ 17:00:00 | NDC 00310620530

## 2020-04-11 MED ADMIN — PIPERACILLIN-TAZOBACTAM 4.5 GRAM IV SOLR [80419]: 4.5 g | INTRAVENOUS | @ 11:00:00 | NDC 60505615900

## 2020-04-11 MED ADMIN — POLYETHYLENE GLYCOL 3350 17 GRAM PO PWPK [25424]: 34 g | ORAL | @ 04:00:00 | NDC 00904693186

## 2020-04-11 MED ADMIN — SODIUM CHLORIDE 0.9 % IV PGBK (MB+) [95161]: 4.5 g | INTRAVENOUS | @ 04:00:00 | NDC 00338915930

## 2020-04-11 MED ADMIN — PREDNISONE 20 MG PO TAB [6496]: 30 mg | ORAL | @ 15:00:00 | NDC 00054001820

## 2020-04-11 MED ADMIN — PIPERACILLIN-TAZOBACTAM 4.5 GRAM IV SOLR [80419]: 4.5 g | INTRAVENOUS | @ 23:00:00 | NDC 60505615900

## 2020-04-11 MED ADMIN — SODIUM CHLORIDE 0.9 % IV PGBK (MB+) [95161]: 4.5 g | INTRAVENOUS | @ 17:00:00 | NDC 00338915930

## 2020-04-11 MED ADMIN — PREDNISONE 10 MG PO TAB [6494]: 30 mg | ORAL | @ 15:00:00 | NDC 00054001720

## 2020-04-11 MED ADMIN — BUMETANIDE 2 MG PO TAB [9311]: 2 mg | ORAL | @ 15:00:00 | NDC 50268013211

## 2020-04-11 MED ADMIN — SODIUM CHLORIDE 0.9 % IV PGBK (MB+) [95161]: 4.5 g | INTRAVENOUS | @ 23:00:00 | NDC 00338915930

## 2020-04-11 MED ADMIN — BUMETANIDE 2 MG PO TAB [9311]: 2 mg | ORAL | @ 23:00:00 | NDC 50268013211

## 2020-04-11 MED ADMIN — CODEINE-GUAIFENESIN 10-100 MG/5 ML PO LIQD [36663]: 10 mL | ORAL | @ 13:00:00 | Stop: 2020-04-11 | NDC 00121177505

## 2020-04-11 MED ADMIN — SODIUM CHLORIDE 3 % IN NEBU [7327]: 4 mL | RESPIRATORY_TRACT | @ 04:00:00 | NDC 00487900360

## 2020-04-11 MED ADMIN — GABAPENTIN 400 MG PO CAP [18307]: 400 mg | ORAL | @ 04:00:00 | NDC 00904666761

## 2020-04-11 MED ADMIN — APIXABAN 2.5 MG PO TAB [315777]: 2.5 mg | ORAL | @ 04:00:00 | NDC 00003089331

## 2020-04-11 MED ADMIN — IPRATROPIUM-ALBUTEROL 0.5 MG-3 MG(2.5 MG BASE)/3 ML IN NEBU [77459]: 3 mL | RESPIRATORY_TRACT | @ 10:00:00 | NDC 00487020101

## 2020-04-11 MED ADMIN — PIPERACILLIN-TAZOBACTAM 4.5 GRAM IV SOLR [80419]: 4.5 g | INTRAVENOUS | @ 04:00:00 | NDC 60505615900

## 2020-04-11 MED ADMIN — IPRATROPIUM-ALBUTEROL 0.5 MG-3 MG(2.5 MG BASE)/3 ML IN NEBU [77459]: 3 mL | RESPIRATORY_TRACT | @ 23:00:00 | NDC 00378967131

## 2020-04-11 MED ADMIN — SENNOSIDES-DOCUSATE SODIUM 8.6-50 MG PO TAB [40926]: 2 | ORAL | @ 04:00:00 | NDC 60687062211

## 2020-04-12 MED ADMIN — SODIUM CHLORIDE 0.9 % IV PGBK (MB+) [95161]: 4.5 g | INTRAVENOUS | @ 05:00:00 | NDC 00338915930

## 2020-04-12 MED ADMIN — APIXABAN 2.5 MG PO TAB [315777]: 2.5 mg | ORAL | @ 15:00:00 | NDC 00003089331

## 2020-04-12 MED ADMIN — SENNOSIDES-DOCUSATE SODIUM 8.6-50 MG PO TAB [40926]: 2 | ORAL | @ 15:00:00 | NDC 60687062211

## 2020-04-12 MED ADMIN — PREDNISONE 20 MG PO TAB [6496]: 30 mg | ORAL | @ 15:00:00 | NDC 00054001820

## 2020-04-12 MED ADMIN — PIPERACILLIN-TAZOBACTAM 4.5 GRAM IV SOLR [80419]: 4.5 g | INTRAVENOUS | @ 05:00:00 | NDC 60505615900

## 2020-04-12 MED ADMIN — SODIUM CHLORIDE 0.9 % IV PGBK (MB+) [95161]: 4.5 g | INTRAVENOUS | @ 23:00:00 | NDC 00338915930

## 2020-04-12 MED ADMIN — GUAIFENESIN 600 MG PO TA12 [321343]: 600 mg | ORAL | @ 05:00:00 | NDC 68084057211

## 2020-04-12 MED ADMIN — IPRATROPIUM-ALBUTEROL 0.5 MG-3 MG(2.5 MG BASE)/3 ML IN NEBU [77459]: 3 mL | RESPIRATORY_TRACT | @ 19:00:00 | NDC 60687040579

## 2020-04-12 MED ADMIN — BUMETANIDE 0.25 MG/ML IJ SOLN [9308]: 3 mg | INTRAVENOUS | @ 22:00:00 | Stop: 2020-04-12 | NDC 00409141234

## 2020-04-12 MED ADMIN — GABAPENTIN 400 MG PO CAP [18307]: 400 mg | ORAL | @ 15:00:00 | NDC 00904666761

## 2020-04-12 MED ADMIN — POLYETHYLENE GLYCOL 3350 17 GRAM PO PWPK [25424]: 34 g | ORAL | @ 05:00:00 | NDC 00904693186

## 2020-04-12 MED ADMIN — PIPERACILLIN-TAZOBACTAM 4.5 GRAM IV SOLR [80419]: 4.5 g | INTRAVENOUS | @ 18:00:00 | NDC 60505615900

## 2020-04-12 MED ADMIN — PIPERACILLIN-TAZOBACTAM 4.5 GRAM IV SOLR [80419]: 4.5 g | INTRAVENOUS | @ 23:00:00 | NDC 60505615900

## 2020-04-12 MED ADMIN — SODIUM CHLORIDE 0.9 % IV PGBK (MB+) [95161]: 4.5 g | INTRAVENOUS | @ 18:00:00 | NDC 00338915930

## 2020-04-12 MED ADMIN — BUMETANIDE 2 MG PO TAB [9311]: 2 mg | ORAL | @ 23:00:00 | NDC 50268013211

## 2020-04-12 MED ADMIN — DAPAGLIFLOZIN 5 MG PO TAB [320171]: 10 mg | ORAL | @ 23:00:00 | NDC 00310620530

## 2020-04-12 MED ADMIN — GUAIFENESIN 600 MG PO TA12 [321343]: 600 mg | ORAL | @ 15:00:00 | NDC 68084057211

## 2020-04-12 MED ADMIN — IPRATROPIUM-ALBUTEROL 0.5 MG-3 MG(2.5 MG BASE)/3 ML IN NEBU [77459]: 3 mL | RESPIRATORY_TRACT | @ 02:00:00 | NDC 00487020101

## 2020-04-12 MED ADMIN — POLYETHYLENE GLYCOL 3350 17 GRAM PO PWPK [25424]: 34 g | ORAL | @ 15:00:00 | NDC 00904693186

## 2020-04-12 MED ADMIN — SODIUM CHLORIDE 0.9 % IV PGBK (MB+) [95161]: 4.5 g | INTRAVENOUS | @ 10:00:00 | NDC 00338915930

## 2020-04-12 MED ADMIN — BUMETANIDE 2 MG PO TAB [9311]: 2 mg | ORAL | @ 15:00:00 | NDC 50268013211

## 2020-04-12 MED ADMIN — IPRATROPIUM-ALBUTEROL 0.5 MG-3 MG(2.5 MG BASE)/3 ML IN NEBU [77459]: 3 mL | RESPIRATORY_TRACT | @ 07:00:00 | NDC 00378967131

## 2020-04-12 MED ADMIN — IPRATROPIUM-ALBUTEROL 0.5 MG-3 MG(2.5 MG BASE)/3 ML IN NEBU [77459]: 3 mL | RESPIRATORY_TRACT | @ 15:00:00 | NDC 00487020101

## 2020-04-12 MED ADMIN — PIPERACILLIN-TAZOBACTAM 4.5 GRAM IV SOLR [80419]: 4.5 g | INTRAVENOUS | @ 10:00:00 | NDC 60505615900

## 2020-04-12 MED ADMIN — APIXABAN 2.5 MG PO TAB [315777]: 2.5 mg | ORAL | @ 05:00:00 | NDC 00003089331

## 2020-04-12 MED ADMIN — PREDNISONE 10 MG PO TAB [6494]: 30 mg | ORAL | @ 15:00:00 | NDC 00054001720

## 2020-04-12 MED ADMIN — IPRATROPIUM-ALBUTEROL 0.5 MG-3 MG(2.5 MG BASE)/3 ML IN NEBU [77459]: 3 mL | RESPIRATORY_TRACT | @ 23:00:00 | NDC 00487020101

## 2020-04-12 MED ADMIN — IPRATROPIUM-ALBUTEROL 0.5 MG-3 MG(2.5 MG BASE)/3 ML IN NEBU [77459]: 3 mL | RESPIRATORY_TRACT | @ 11:00:00 | NDC 60687040579

## 2020-04-12 MED ADMIN — GABAPENTIN 400 MG PO CAP [18307]: 400 mg | ORAL | @ 05:00:00 | NDC 00904666761

## 2020-04-13 ENCOUNTER — Inpatient Hospital Stay: Admit: 2020-04-13 | Discharge: 2020-04-13 | Payer: MEDICARE

## 2020-04-13 ENCOUNTER — Encounter: Admit: 2020-04-13 | Discharge: 2020-04-13 | Payer: MEDICARE

## 2020-04-13 MED ADMIN — DAPAGLIFLOZIN 5 MG PO TAB [320171]: 10 mg | ORAL | @ 14:00:00 | NDC 00310620530

## 2020-04-13 MED ADMIN — PREDNISONE 20 MG PO TAB [6496]: 30 mg | ORAL | @ 14:00:00 | NDC 00054001820

## 2020-04-13 MED ADMIN — IPRATROPIUM-ALBUTEROL 0.5 MG-3 MG(2.5 MG BASE)/3 ML IN NEBU [77459]: 3 mL | RESPIRATORY_TRACT | @ 16:00:00 | NDC 60687040579

## 2020-04-13 MED ADMIN — APIXABAN 2.5 MG PO TAB [315777]: 2.5 mg | ORAL | @ 14:00:00 | NDC 00003089331

## 2020-04-13 MED ADMIN — PREDNISONE 10 MG PO TAB [6494]: 30 mg | ORAL | @ 14:00:00 | NDC 00054001720

## 2020-04-13 MED ADMIN — PIPERACILLIN-TAZOBACTAM 4.5 GRAM IV SOLR [80419]: 4.5 g | INTRAVENOUS | @ 17:00:00 | NDC 60505615900

## 2020-04-13 MED ADMIN — IPRATROPIUM-ALBUTEROL 0.5 MG-3 MG(2.5 MG BASE)/3 ML IN NEBU [77459]: 3 mL | RESPIRATORY_TRACT | @ 20:00:00 | NDC 69097017348

## 2020-04-13 MED ADMIN — SODIUM CHLORIDE 0.65 % NA SPRA [29676]: 2 | NASAL | @ 05:00:00 | NDC 00904386575

## 2020-04-13 MED ADMIN — BUMETANIDE 0.25 MG/ML IJ SOLN [9308]: 3 mg | INTRAVENOUS | @ 17:00:00 | Stop: 2020-04-13 | NDC 00409141234

## 2020-04-13 MED ADMIN — SODIUM CHLORIDE 0.9 % IV PGBK (MB+) [95161]: 4.5 g | INTRAVENOUS | @ 10:00:00 | NDC 00338915930

## 2020-04-13 MED ADMIN — SODIUM CHLORIDE 0.9 % IV PGBK (MB+) [95161]: 4.5 g | INTRAVENOUS | @ 05:00:00 | NDC 00338915930

## 2020-04-13 MED ADMIN — GUAIFENESIN 600 MG PO TA12 [321343]: 600 mg | ORAL | @ 14:00:00 | NDC 68084057211

## 2020-04-13 MED ADMIN — PIPERACILLIN-TAZOBACTAM 4.5 GRAM IV SOLR [80419]: 4.5 g | INTRAVENOUS | @ 23:00:00 | NDC 60505615900

## 2020-04-13 MED ADMIN — BUMETANIDE 2 MG PO TAB [9311]: 2 mg | ORAL | @ 14:00:00 | NDC 50268013211

## 2020-04-13 MED ADMIN — IPRATROPIUM-ALBUTEROL 0.5 MG-3 MG(2.5 MG BASE)/3 ML IN NEBU [77459]: 3 mL | RESPIRATORY_TRACT | @ 10:00:00 | NDC 00487020101

## 2020-04-13 MED ADMIN — IPRATROPIUM-ALBUTEROL 0.5 MG-3 MG(2.5 MG BASE)/3 ML IN NEBU [77459]: 3 mL | RESPIRATORY_TRACT | @ 03:00:00 | NDC 00378967131

## 2020-04-13 MED ADMIN — GABAPENTIN 400 MG PO CAP [18307]: 400 mg | ORAL | @ 07:00:00 | NDC 00904666761

## 2020-04-13 MED ADMIN — POLYETHYLENE GLYCOL 3350 17 GRAM PO PWPK [25424]: 34 g | ORAL | @ 14:00:00 | NDC 00904693186

## 2020-04-13 MED ADMIN — BUMETANIDE 2 MG PO TAB [9311]: 2 mg | ORAL | @ 23:00:00 | NDC 50268013211

## 2020-04-13 MED ADMIN — GABAPENTIN 400 MG PO CAP [18307]: 400 mg | ORAL | @ 14:00:00 | NDC 00904666761

## 2020-04-13 MED ADMIN — SENNOSIDES-DOCUSATE SODIUM 8.6-50 MG PO TAB [40926]: 2 | ORAL | @ 14:00:00 | NDC 60687062211

## 2020-04-13 MED ADMIN — IPRATROPIUM-ALBUTEROL 0.5 MG-3 MG(2.5 MG BASE)/3 ML IN NEBU [77459]: 3 mL | RESPIRATORY_TRACT | @ 07:00:00 | NDC 00378967131

## 2020-04-13 MED ADMIN — SODIUM CHLORIDE 0.9 % IV PGBK (MB+) [95161]: 4.5 g | INTRAVENOUS | @ 23:00:00 | NDC 00338915930

## 2020-04-13 MED ADMIN — APIXABAN 2.5 MG PO TAB [315777]: 2.5 mg | ORAL | @ 05:00:00 | NDC 00003089331

## 2020-04-13 MED ADMIN — PIPERACILLIN-TAZOBACTAM 4.5 GRAM IV SOLR [80419]: 4.5 g | INTRAVENOUS | @ 05:00:00 | NDC 60505615900

## 2020-04-13 MED ADMIN — PIPERACILLIN-TAZOBACTAM 4.5 GRAM IV SOLR [80419]: 4.5 g | INTRAVENOUS | @ 10:00:00 | NDC 60505615900

## 2020-04-13 MED ADMIN — SODIUM CHLORIDE 0.9 % IV PGBK (MB+) [95161]: 4.5 g | INTRAVENOUS | @ 17:00:00 | NDC 00338915930

## 2020-04-13 MED ADMIN — GUAIFENESIN 600 MG PO TA12 [321343]: 600 mg | ORAL | @ 05:00:00 | NDC 68084057211

## 2020-04-13 MED ADMIN — IPRATROPIUM-ALBUTEROL 0.5 MG-3 MG(2.5 MG BASE)/3 ML IN NEBU [77459]: 3 mL | RESPIRATORY_TRACT | @ 23:00:00 | NDC 69097017348

## 2020-04-13 MED ADMIN — CODEINE-GUAIFENESIN 10-100 MG/5 ML PO LIQD [36663]: 15 mL | ORAL | @ 06:00:00 | NDC 50383008705

## 2020-04-14 ENCOUNTER — Encounter: Admit: 2020-04-14 | Discharge: 2020-04-14 | Payer: MEDICARE

## 2020-04-14 MED ADMIN — BUMETANIDE 0.25 MG/ML IJ SOLN [9308]: 4 mg | INTRAVENOUS | @ 17:00:00 | NDC 00409141234

## 2020-04-14 MED ADMIN — IPRATROPIUM-ALBUTEROL 0.5 MG-3 MG(2.5 MG BASE)/3 ML IN NEBU [77459]: 3 mL | RESPIRATORY_TRACT | @ 03:00:00 | NDC 60687040579

## 2020-04-14 MED ADMIN — GUAIFENESIN 600 MG PO TA12 [321343]: 600 mg | ORAL | @ 15:00:00 | NDC 68084057211

## 2020-04-14 MED ADMIN — POTASSIUM CHLORIDE 20 MEQ PO TBTQ [35943]: 40 meq | ORAL | @ 22:00:00 | Stop: 2020-04-14 | NDC 00245531989

## 2020-04-14 MED ADMIN — PIPERACILLIN-TAZOBACTAM 4.5 GRAM IV SOLR [80419]: 4.5 g | INTRAVENOUS | @ 17:00:00 | NDC 60505615900

## 2020-04-14 MED ADMIN — APIXABAN 2.5 MG PO TAB [315777]: 2.5 mg | ORAL | @ 15:00:00 | NDC 00003089331

## 2020-04-14 MED ADMIN — PREDNISONE 10 MG PO TAB [6494]: 30 mg | ORAL | @ 15:00:00 | NDC 00054001720

## 2020-04-14 MED ADMIN — APIXABAN 2.5 MG PO TAB [315777]: 2.5 mg | ORAL | @ 03:00:00 | NDC 00003089331

## 2020-04-14 MED ADMIN — IPRATROPIUM-ALBUTEROL 0.5 MG-3 MG(2.5 MG BASE)/3 ML IN NEBU [77459]: 3 mL | RESPIRATORY_TRACT | @ 23:00:00 | NDC 60687040579

## 2020-04-14 MED ADMIN — SENNOSIDES-DOCUSATE SODIUM 8.6-50 MG PO TAB [40926]: 2 | ORAL | @ 15:00:00 | NDC 60687062211

## 2020-04-14 MED ADMIN — METOLAZONE 5 MG PO TAB [10588]: 5 mg | ORAL | @ 22:00:00 | Stop: 2020-04-14 | NDC 00185005501

## 2020-04-14 MED ADMIN — PIPERACILLIN-TAZOBACTAM 4.5 GRAM IV SOLR [80419]: 4.5 g | INTRAVENOUS | @ 10:00:00 | NDC 60505615900

## 2020-04-14 MED ADMIN — IPRATROPIUM-ALBUTEROL 0.5 MG-3 MG(2.5 MG BASE)/3 ML IN NEBU [77459]: 3 mL | RESPIRATORY_TRACT | @ 19:00:00 | NDC 60687040579

## 2020-04-14 MED ADMIN — PREDNISONE 20 MG PO TAB [6496]: 30 mg | ORAL | @ 15:00:00 | NDC 00054001820

## 2020-04-14 MED ADMIN — GUAIFENESIN 600 MG PO TA12 [321343]: 600 mg | ORAL | @ 03:00:00 | NDC 68084057211

## 2020-04-14 MED ADMIN — SODIUM CHLORIDE 0.9 % IV PGBK (MB+) [95161]: 4.5 g | INTRAVENOUS | @ 22:00:00 | NDC 00338915930

## 2020-04-14 MED ADMIN — SODIUM CHLORIDE 0.9 % IV PGBK (MB+) [95161]: 4.5 g | INTRAVENOUS | @ 10:00:00 | NDC 00338915930

## 2020-04-14 MED ADMIN — DAPAGLIFLOZIN 5 MG PO TAB [320171]: 10 mg | ORAL | @ 15:00:00 | NDC 00310620530

## 2020-04-14 MED ADMIN — IPRATROPIUM-ALBUTEROL 0.5 MG-3 MG(2.5 MG BASE)/3 ML IN NEBU [77459]: 3 mL | RESPIRATORY_TRACT | @ 11:00:00 | NDC 60687040579

## 2020-04-14 MED ADMIN — PIPERACILLIN-TAZOBACTAM 4.5 GRAM IV SOLR [80419]: 4.5 g | INTRAVENOUS | @ 22:00:00 | NDC 60505615900

## 2020-04-14 MED ADMIN — BUMETANIDE 2 MG PO TAB [9311]: 2 mg | ORAL | @ 15:00:00 | Stop: 2020-04-14 | NDC 50268013211

## 2020-04-14 MED ADMIN — SODIUM CHLORIDE 0.9 % IV PGBK (MB+) [95161]: 4.5 g | INTRAVENOUS | @ 04:00:00 | NDC 00338915930

## 2020-04-14 MED ADMIN — SODIUM CHLORIDE 0.9 % IV PGBK (MB+) [95161]: 4.5 g | INTRAVENOUS | @ 17:00:00 | NDC 00338915930

## 2020-04-14 MED ADMIN — PIPERACILLIN-TAZOBACTAM 4.5 GRAM IV SOLR [80419]: 4.5 g | INTRAVENOUS | @ 04:00:00 | NDC 60505615900

## 2020-04-14 MED ADMIN — POLYETHYLENE GLYCOL 3350 17 GRAM PO PWPK [25424]: 34 g | ORAL | @ 15:00:00 | NDC 00904693186

## 2020-04-14 MED ADMIN — SENNOSIDES-DOCUSATE SODIUM 8.6-50 MG PO TAB [40926]: 2 | ORAL | @ 03:00:00 | NDC 60687062211

## 2020-04-14 MED ADMIN — POLYETHYLENE GLYCOL 3350 17 GRAM PO PWPK [25424]: 34 g | ORAL | @ 03:00:00 | NDC 00904693186

## 2020-04-14 MED ADMIN — GABAPENTIN 400 MG PO CAP [18307]: 400 mg | ORAL | @ 03:00:00 | NDC 00904666761

## 2020-04-14 MED ADMIN — BUMETANIDE 0.25 MG/ML IJ SOLN [9308]: 4 mg | INTRAVENOUS | @ 23:00:00 | Stop: 2020-04-14 | NDC 00409141234

## 2020-04-14 MED ADMIN — IPRATROPIUM-ALBUTEROL 0.5 MG-3 MG(2.5 MG BASE)/3 ML IN NEBU [77459]: 3 mL | RESPIRATORY_TRACT | @ 07:00:00 | NDC 60687040579

## 2020-04-14 MED ADMIN — GABAPENTIN 400 MG PO CAP [18307]: 400 mg | ORAL | @ 15:00:00 | NDC 00904666761

## 2020-04-14 MED ADMIN — IPRATROPIUM-ALBUTEROL 0.5 MG-3 MG(2.5 MG BASE)/3 ML IN NEBU [77459]: 3 mL | RESPIRATORY_TRACT | @ 15:00:00 | NDC 60687040579

## 2020-04-15 ENCOUNTER — Encounter: Admit: 2020-04-15 | Discharge: 2020-04-15 | Payer: MEDICARE

## 2020-04-15 MED ADMIN — PREDNISONE 10 MG PO TAB [6494]: 30 mg | ORAL | @ 14:00:00 | NDC 00054001720

## 2020-04-15 MED ADMIN — IPRATROPIUM-ALBUTEROL 0.5 MG-3 MG(2.5 MG BASE)/3 ML IN NEBU [77459]: 3 mL | RESPIRATORY_TRACT | @ 03:00:00 | NDC 60687040579

## 2020-04-15 MED ADMIN — IPRATROPIUM-ALBUTEROL 0.5 MG-3 MG(2.5 MG BASE)/3 ML IN NEBU [77459]: 3 mL | RESPIRATORY_TRACT | @ 10:00:00 | NDC 60687040579

## 2020-04-15 MED ADMIN — IPRATROPIUM-ALBUTEROL 0.5 MG-3 MG(2.5 MG BASE)/3 ML IN NEBU [77459]: 3 mL | RESPIRATORY_TRACT | @ 23:00:00 | NDC 00487020101

## 2020-04-15 MED ADMIN — BUMETANIDE 0.25 MG/ML IJ SOLN [9308]: 4 mg | INTRAVENOUS | @ 19:00:00 | NDC 00409141234

## 2020-04-15 MED ADMIN — SODIUM CHLORIDE 0.9 % IV PGBK (MB+) [95161]: 4.5 g | INTRAVENOUS | @ 10:00:00 | NDC 00338915930

## 2020-04-15 MED ADMIN — BUMETANIDE 0.25 MG/ML IJ SOLN [9308]: 4 mg | INTRAVENOUS | @ 14:00:00 | Stop: 2020-04-15 | NDC 00409141234

## 2020-04-15 MED ADMIN — SODIUM CHLORIDE 0.9 % IV PGBK (MB+) [95161]: 4.5 g | INTRAVENOUS | @ 23:00:00 | NDC 00338915930

## 2020-04-15 MED ADMIN — SENNOSIDES-DOCUSATE SODIUM 8.6-50 MG PO TAB [40926]: 2 | ORAL | @ 14:00:00 | NDC 60687062211

## 2020-04-15 MED ADMIN — APIXABAN 2.5 MG PO TAB [315777]: 2.5 mg | ORAL | @ 03:00:00 | NDC 00003089331

## 2020-04-15 MED ADMIN — DAPAGLIFLOZIN 5 MG PO TAB [320171]: 10 mg | ORAL | @ 14:00:00 | NDC 00310620530

## 2020-04-15 MED ADMIN — PIPERACILLIN-TAZOBACTAM 4.5 GRAM IV SOLR [80419]: 4.5 g | INTRAVENOUS | @ 04:00:00 | NDC 60505615900

## 2020-04-15 MED ADMIN — PREDNISONE 20 MG PO TAB [6496]: 30 mg | ORAL | @ 14:00:00 | NDC 00054001820

## 2020-04-15 MED ADMIN — GABAPENTIN 400 MG PO CAP [18307]: 400 mg | ORAL | @ 03:00:00 | NDC 00904666761

## 2020-04-15 MED ADMIN — PIPERACILLIN-TAZOBACTAM 4.5 GRAM IV SOLR [80419]: 4.5 g | INTRAVENOUS | @ 17:00:00 | NDC 60505615900

## 2020-04-15 MED ADMIN — POLYETHYLENE GLYCOL 3350 17 GRAM PO PWPK [25424]: 34 g | ORAL | @ 14:00:00 | NDC 00904693186

## 2020-04-15 MED ADMIN — SODIUM CHLORIDE 0.9 % IV PGBK (MB+) [95161]: 4.5 g | INTRAVENOUS | @ 04:00:00 | NDC 00338915930

## 2020-04-15 MED ADMIN — APIXABAN 2.5 MG PO TAB [315777]: 2.5 mg | ORAL | @ 14:00:00 | NDC 00003089331

## 2020-04-15 MED ADMIN — IPRATROPIUM-ALBUTEROL 0.5 MG-3 MG(2.5 MG BASE)/3 ML IN NEBU [77459]: 3 mL | RESPIRATORY_TRACT | @ 16:00:00 | NDC 60687040579

## 2020-04-15 MED ADMIN — POTASSIUM CHLORIDE 20 MEQ PO TBTQ [35943]: 40 meq | ORAL | @ 19:00:00 | Stop: 2020-04-15 | NDC 00245531989

## 2020-04-15 MED ADMIN — PIPERACILLIN-TAZOBACTAM 4.5 GRAM IV SOLR [80419]: 4.5 g | INTRAVENOUS | @ 23:00:00 | NDC 60505615900

## 2020-04-15 MED ADMIN — GUAIFENESIN 600 MG PO TA12 [321343]: 600 mg | ORAL | @ 03:00:00 | NDC 68084057211

## 2020-04-15 MED ADMIN — GABAPENTIN 400 MG PO CAP [18307]: 400 mg | ORAL | @ 14:00:00 | NDC 00904666761

## 2020-04-15 MED ADMIN — BUMETANIDE 0.25 MG/ML IJ SOLN [9308]: 4 mg | INTRAVENOUS | @ 03:00:00 | NDC 00409141234

## 2020-04-15 MED ADMIN — SODIUM CHLORIDE 0.9 % IV PGBK (MB+) [95161]: 4.5 g | INTRAVENOUS | @ 17:00:00 | NDC 00338915930

## 2020-04-15 MED ADMIN — GUAIFENESIN 600 MG PO TA12 [321343]: 600 mg | ORAL | @ 14:00:00 | NDC 68084057211

## 2020-04-15 MED ADMIN — IPRATROPIUM-ALBUTEROL 0.5 MG-3 MG(2.5 MG BASE)/3 ML IN NEBU [77459]: 3 mL | RESPIRATORY_TRACT | @ 19:00:00 | NDC 60687040579

## 2020-04-15 MED ADMIN — PIPERACILLIN-TAZOBACTAM 4.5 GRAM IV SOLR [80419]: 4.5 g | INTRAVENOUS | @ 10:00:00 | NDC 60505615900

## 2020-04-16 ENCOUNTER — Encounter: Admit: 2020-04-16 | Discharge: 2020-04-16 | Payer: MEDICARE

## 2020-04-16 MED ADMIN — IPRATROPIUM-ALBUTEROL 0.5 MG-3 MG(2.5 MG BASE)/3 ML IN NEBU [77459]: 3 mL | RESPIRATORY_TRACT | @ 15:00:00 | NDC 60687040579

## 2020-04-16 MED ADMIN — POLYETHYLENE GLYCOL 3350 17 GRAM PO PWPK [25424]: 34 g | ORAL | @ 14:00:00 | NDC 00904693186

## 2020-04-16 MED ADMIN — SODIUM CHLORIDE 0.9 % IV PGBK (MB+) [95161]: 4.5 g | INTRAVENOUS | @ 17:00:00 | Stop: 2020-04-17 | NDC 00338915930

## 2020-04-16 MED ADMIN — GABAPENTIN 400 MG PO CAP [18307]: 400 mg | ORAL | @ 03:00:00 | NDC 00904666761

## 2020-04-16 MED ADMIN — PIPERACILLIN-TAZOBACTAM 4.5 GRAM IV SOLR [80419]: 4.5 g | INTRAVENOUS | @ 12:00:00 | Stop: 2020-04-17 | NDC 60505615900

## 2020-04-16 MED ADMIN — SENNOSIDES-DOCUSATE SODIUM 8.6-50 MG PO TAB [40926]: 2 | ORAL | @ 14:00:00 | NDC 60687062211

## 2020-04-16 MED ADMIN — PREDNISONE 20 MG PO TAB [6496]: 30 mg | ORAL | @ 14:00:00 | Stop: 2020-04-16 | NDC 00054001820

## 2020-04-16 MED ADMIN — IPRATROPIUM-ALBUTEROL 0.5 MG-3 MG(2.5 MG BASE)/3 ML IN NEBU [77459]: 3 mL | RESPIRATORY_TRACT | @ 19:00:00 | NDC 60687040579

## 2020-04-16 MED ADMIN — APIXABAN 2.5 MG PO TAB [315777]: 2.5 mg | ORAL | @ 03:00:00 | NDC 00003089331

## 2020-04-16 MED ADMIN — PIPERACILLIN-TAZOBACTAM 4.5 GRAM IV SOLR [80419]: 4.5 g | INTRAVENOUS | @ 17:00:00 | Stop: 2020-04-17 | NDC 60505615900

## 2020-04-16 MED ADMIN — BUMETANIDE 0.25 MG/ML IJ SOLN [9308]: 4 mg | INTRAVENOUS | @ 03:00:00 | NDC 00409141234

## 2020-04-16 MED ADMIN — SODIUM CHLORIDE 0.9 % IV PGBK (MB+) [95161]: 4.5 g | INTRAVENOUS | @ 06:00:00 | NDC 00338915930

## 2020-04-16 MED ADMIN — DAPAGLIFLOZIN 5 MG PO TAB [320171]: 10 mg | ORAL | @ 14:00:00 | NDC 00310620530

## 2020-04-16 MED ADMIN — APIXABAN 2.5 MG PO TAB [315777]: 2.5 mg | ORAL | @ 14:00:00 | NDC 00003089331

## 2020-04-16 MED ADMIN — INSULIN GLARGINE 100 UNIT/ML (3 ML) SC INJ PEN [163596]: 10 [IU] | SUBCUTANEOUS | @ 04:00:00 | NDC 00088221905

## 2020-04-16 MED ADMIN — SODIUM CHLORIDE 0.9 % IV PGBK (MB+) [95161]: 4.5 g | INTRAVENOUS | @ 12:00:00 | Stop: 2020-04-17 | NDC 00338915930

## 2020-04-16 MED ADMIN — IPRATROPIUM-ALBUTEROL 0.5 MG-3 MG(2.5 MG BASE)/3 ML IN NEBU [77459]: 3 mL | RESPIRATORY_TRACT | @ 03:00:00 | NDC 60687040579

## 2020-04-16 MED ADMIN — PREDNISONE 10 MG PO TAB [6494]: 30 mg | ORAL | @ 14:00:00 | Stop: 2020-04-16 | NDC 00054001720

## 2020-04-16 MED ADMIN — IPRATROPIUM-ALBUTEROL 0.5 MG-3 MG(2.5 MG BASE)/3 ML IN NEBU [77459]: 3 mL | RESPIRATORY_TRACT | @ 23:00:00 | NDC 60687040579

## 2020-04-16 MED ADMIN — BUMETANIDE 2 MG PO TAB [9311]: 4 mg | ORAL | @ 14:00:00 | Stop: 2020-04-16 | NDC 50268013211

## 2020-04-16 MED ADMIN — GABAPENTIN 400 MG PO CAP [18307]: 400 mg | ORAL | @ 14:00:00 | NDC 00904666761

## 2020-04-16 MED ADMIN — GUAIFENESIN 600 MG PO TA12 [321343]: 600 mg | ORAL | @ 14:00:00 | NDC 68084057211

## 2020-04-16 MED ADMIN — GUAIFENESIN 600 MG PO TA12 [321343]: 600 mg | ORAL | @ 03:00:00 | NDC 68084057211

## 2020-04-16 MED ADMIN — PIPERACILLIN-TAZOBACTAM 4.5 GRAM IV SOLR [80419]: 4.5 g | INTRAVENOUS | @ 06:00:00 | NDC 60505615900

## 2020-04-17 ENCOUNTER — Encounter: Admit: 2020-04-17 | Discharge: 2020-04-17 | Payer: MEDICARE

## 2020-04-17 MED ADMIN — NYSTATIN 100,000 UNIT/GRAM TP POWD [39136]: TOPICAL | @ 12:00:00 | NDC 00832046515

## 2020-04-17 MED ADMIN — SENNOSIDES-DOCUSATE SODIUM 8.6-50 MG PO TAB [40926]: 2 | ORAL | @ 16:00:00 | NDC 60687062211

## 2020-04-17 MED ADMIN — PIPERACILLIN-TAZOBACTAM 4.5 GRAM IV SOLR [80419]: 4.5 g | INTRAVENOUS | @ 04:00:00 | Stop: 2020-04-17 | NDC 60505615900

## 2020-04-17 MED ADMIN — IPRATROPIUM-ALBUTEROL 0.5 MG-3 MG(2.5 MG BASE)/3 ML IN NEBU [77459]: 3 mL | RESPIRATORY_TRACT | @ 15:00:00 | NDC 60687040579

## 2020-04-17 MED ADMIN — PIPERACILLIN-TAZOBACTAM 4.5 GRAM IV SOLR [80419]: 4.5 g | INTRAVENOUS | Stop: 2020-04-17 | NDC 60505615900

## 2020-04-17 MED ADMIN — DAPAGLIFLOZIN 5 MG PO TAB [320171]: 10 mg | ORAL | @ 16:00:00 | NDC 00310620530

## 2020-04-17 MED ADMIN — PREDNISONE 20 MG PO TAB [6496]: 20 mg | ORAL | @ 16:00:00 | NDC 00054001820

## 2020-04-17 MED ADMIN — IPRATROPIUM-ALBUTEROL 0.5 MG-3 MG(2.5 MG BASE)/3 ML IN NEBU [77459]: 3 mL | RESPIRATORY_TRACT | @ 20:00:00 | NDC 60687040579

## 2020-04-17 MED ADMIN — GABAPENTIN 400 MG PO CAP [18307]: 400 mg | ORAL | @ 02:00:00 | NDC 00904666761

## 2020-04-17 MED ADMIN — SODIUM CHLORIDE 0.9 % IV PGBK (MB+) [95161]: 4.5 g | INTRAVENOUS | @ 04:00:00 | Stop: 2020-04-17 | NDC 00338055318

## 2020-04-17 MED ADMIN — BUMETANIDE 2 MG PO TAB [9311]: 2 mg | ORAL | @ 23:00:00 | NDC 50268013211

## 2020-04-17 MED ADMIN — SODIUM CHLORIDE 0.9 % IV PGBK (MB+) [95161]: 4.5 g | INTRAVENOUS | Stop: 2020-04-17 | NDC 00338915930

## 2020-04-17 MED ADMIN — APIXABAN 2.5 MG PO TAB [315777]: 2.5 mg | ORAL | @ 16:00:00 | NDC 00003089331

## 2020-04-17 MED ADMIN — POTASSIUM CHLORIDE 20 MEQ PO TBTQ [35943]: 40 meq | ORAL | @ 23:00:00 | Stop: 2020-04-17 | NDC 00245531989

## 2020-04-17 MED ADMIN — BUMETANIDE 2 MG PO TAB [9311]: 2 mg | ORAL | @ 16:00:00 | NDC 50268013211

## 2020-04-17 MED ADMIN — IPRATROPIUM-ALBUTEROL 0.5 MG-3 MG(2.5 MG BASE)/3 ML IN NEBU [77459]: 3 mL | RESPIRATORY_TRACT | @ 10:00:00 | NDC 60687040579

## 2020-04-17 MED ADMIN — INSULIN ASPART 100 UNIT/ML SC FLEXPEN [87504]: 4 [IU] | SUBCUTANEOUS | @ 18:00:00 | NDC 00169633910

## 2020-04-17 MED ADMIN — INSULIN GLARGINE 100 UNIT/ML (3 ML) SC INJ PEN [163596]: 15 [IU] | SUBCUTANEOUS | @ 03:00:00 | NDC 00088221905

## 2020-04-17 MED ADMIN — GUAIFENESIN 600 MG PO TA12 [321343]: 600 mg | ORAL | @ 16:00:00 | NDC 68084057211

## 2020-04-17 MED ADMIN — POLYETHYLENE GLYCOL 3350 17 GRAM PO PWPK [25424]: 34 g | ORAL | @ 16:00:00 | NDC 00904693186

## 2020-04-17 MED ADMIN — APIXABAN 2.5 MG PO TAB [315777]: 2.5 mg | ORAL | @ 02:00:00 | NDC 00003089331

## 2020-04-17 MED ADMIN — IPRATROPIUM-ALBUTEROL 0.5 MG-3 MG(2.5 MG BASE)/3 ML IN NEBU [77459]: 3 mL | RESPIRATORY_TRACT | @ 23:00:00 | NDC 00487020101

## 2020-04-17 MED ADMIN — IPRATROPIUM-ALBUTEROL 0.5 MG-3 MG(2.5 MG BASE)/3 ML IN NEBU [77459]: 3 mL | RESPIRATORY_TRACT | @ 03:00:00 | NDC 00378967131

## 2020-04-17 MED ADMIN — GABAPENTIN 400 MG PO CAP [18307]: 400 mg | ORAL | @ 16:00:00 | NDC 00904666761

## 2020-04-17 MED ADMIN — GUAIFENESIN 600 MG PO TA12 [321343]: 600 mg | ORAL | @ 02:00:00 | NDC 68084057211

## 2020-04-18 ENCOUNTER — Encounter: Admit: 2020-04-18 | Discharge: 2020-04-18 | Payer: MEDICARE

## 2020-04-18 MED ADMIN — PREDNISONE 20 MG PO TAB [6496]: 20 mg | ORAL | @ 15:00:00 | NDC 00054001820

## 2020-04-18 MED ADMIN — GABAPENTIN 400 MG PO CAP [18307]: 400 mg | ORAL | @ 03:00:00 | NDC 00904666761

## 2020-04-18 MED ADMIN — SENNOSIDES-DOCUSATE SODIUM 8.6-50 MG PO TAB [40926]: 2 | ORAL | @ 15:00:00 | NDC 60687062211

## 2020-04-18 MED ADMIN — DAPAGLIFLOZIN 5 MG PO TAB [320171]: 10 mg | ORAL | @ 15:00:00 | NDC 00310620530

## 2020-04-18 MED ADMIN — POLYETHYLENE GLYCOL 3350 17 GRAM PO PWPK [25424]: 34 g | ORAL | @ 15:00:00 | NDC 00904693186

## 2020-04-18 MED ADMIN — IPRATROPIUM-ALBUTEROL 0.5 MG-3 MG(2.5 MG BASE)/3 ML IN NEBU [77459]: 3 mL | RESPIRATORY_TRACT | @ 15:00:00 | NDC 60687040579

## 2020-04-18 MED ADMIN — CODEINE-GUAIFENESIN 10-100 MG/5 ML PO LIQD [36663]: 15 mL | ORAL | @ 10:00:00 | NDC 50383008705

## 2020-04-18 MED ADMIN — GUAIFENESIN 600 MG PO TA12 [321343]: 600 mg | ORAL | @ 03:00:00 | NDC 68084057211

## 2020-04-18 MED ADMIN — IPRATROPIUM-ALBUTEROL 0.5 MG-3 MG(2.5 MG BASE)/3 ML IN NEBU [77459]: 3 mL | RESPIRATORY_TRACT | @ 11:00:00 | NDC 60687040579

## 2020-04-18 MED ADMIN — BUMETANIDE 0.25 MG/ML IJ SOLN [9308]: 3 mg | INTRAVENOUS | @ 22:00:00 | Stop: 2020-04-18 | NDC 00409141234

## 2020-04-18 MED ADMIN — IPRATROPIUM-ALBUTEROL 0.5 MG-3 MG(2.5 MG BASE)/3 ML IN NEBU [77459]: 3 mL | RESPIRATORY_TRACT | @ 19:00:00 | NDC 60687040579

## 2020-04-18 MED ADMIN — IPRATROPIUM-ALBUTEROL 0.5 MG-3 MG(2.5 MG BASE)/3 ML IN NEBU [77459]: 3 mL | RESPIRATORY_TRACT | @ 07:00:00 | NDC 60687040579

## 2020-04-18 MED ADMIN — APIXABAN 2.5 MG PO TAB [315777]: 2.5 mg | ORAL | @ 15:00:00 | NDC 00003089331

## 2020-04-18 MED ADMIN — IPRATROPIUM-ALBUTEROL 0.5 MG-3 MG(2.5 MG BASE)/3 ML IN NEBU [77459]: 3 mL | RESPIRATORY_TRACT | @ 22:00:00 | NDC 60687040579

## 2020-04-18 MED ADMIN — APIXABAN 2.5 MG PO TAB [315777]: 2.5 mg | ORAL | @ 03:00:00 | NDC 00003089331

## 2020-04-18 MED ADMIN — IPRATROPIUM-ALBUTEROL 0.5 MG-3 MG(2.5 MG BASE)/3 ML IN NEBU [77459]: 3 mL | RESPIRATORY_TRACT | @ 03:00:00 | NDC 60687040579

## 2020-04-18 MED ADMIN — GUAIFENESIN 600 MG PO TA12 [321343]: 600 mg | ORAL | @ 15:00:00 | NDC 68084057211

## 2020-04-18 MED ADMIN — BUMETANIDE 2 MG PO TAB [9311]: 2 mg | ORAL | @ 15:00:00 | Stop: 2020-04-18 | NDC 50268013211

## 2020-04-18 MED ADMIN — GABAPENTIN 400 MG PO CAP [18307]: 400 mg | ORAL | @ 15:00:00 | NDC 00904666761

## 2020-04-19 ENCOUNTER — Encounter: Admit: 2020-04-19 | Discharge: 2020-04-19 | Payer: MEDICARE

## 2020-04-19 MED ADMIN — SENNOSIDES-DOCUSATE SODIUM 8.6-50 MG PO TAB [40926]: 2 | ORAL | @ 16:00:00 | NDC 60687062211

## 2020-04-19 MED ADMIN — APIXABAN 2.5 MG PO TAB [315777]: 2.5 mg | ORAL | @ 16:00:00 | Stop: 2020-04-19 | NDC 00003089331

## 2020-04-19 MED ADMIN — BUMETANIDE 0.25 MG/ML IJ SOLN [9308]: 3 mg | INTRAVENOUS | @ 18:00:00 | NDC 70860040541

## 2020-04-19 MED ADMIN — PREDNISONE 20 MG PO TAB [6496]: 20 mg | ORAL | @ 16:00:00 | Stop: 2020-04-19 | NDC 00054001820

## 2020-04-19 MED ADMIN — IPRATROPIUM-ALBUTEROL 0.5 MG-3 MG(2.5 MG BASE)/3 ML IN NEBU [77459]: 3 mL | RESPIRATORY_TRACT | @ 19:00:00 | NDC 60687040579

## 2020-04-19 MED ADMIN — DAPAGLIFLOZIN 5 MG PO TAB [320171]: 10 mg | ORAL | @ 16:00:00 | NDC 00310620530

## 2020-04-19 MED ADMIN — NYSTATIN 100,000 UNIT/ML PO SUSP [5751]: 500000 [IU] | ORAL | @ 19:00:00 | NDC 66689003701

## 2020-04-19 MED ADMIN — GABAPENTIN 400 MG PO CAP [18307]: 400 mg | ORAL | @ 03:00:00 | NDC 00904666761

## 2020-04-19 MED ADMIN — GUAIFENESIN 600 MG PO TA12 [321343]: 600 mg | ORAL | @ 16:00:00 | NDC 68084057211

## 2020-04-19 MED ADMIN — BUMETANIDE 0.25 MG/ML IJ SOLN [9308]: 3 mg | INTRAVENOUS | @ 18:00:00 | NDC 00409141234

## 2020-04-19 MED ADMIN — GUAIFENESIN 600 MG PO TA12 [321343]: 600 mg | ORAL | @ 03:00:00 | NDC 68084057211

## 2020-04-19 MED ADMIN — APIXABAN 2.5 MG PO TAB [315777]: 2.5 mg | ORAL | @ 03:00:00 | NDC 00003089331

## 2020-04-19 MED ADMIN — DIPHENHYDRAMINE(#)/LIDOCAINE/ANTACID 1:1:1 PO SUSP [210058]: 10 mL | ORAL | @ 05:00:00 | NDC 54029393809

## 2020-04-19 MED ADMIN — GABAPENTIN 400 MG PO CAP [18307]: 400 mg | ORAL | @ 11:00:00 | NDC 00904666761

## 2020-04-19 MED ADMIN — NYSTATIN 100,000 UNIT/ML PO SUSP [5751]: 500000 [IU] | ORAL | NDC 66689003701

## 2020-04-19 MED ADMIN — IPRATROPIUM-ALBUTEROL 0.5 MG-3 MG(2.5 MG BASE)/3 ML IN NEBU [77459]: 3 mL | RESPIRATORY_TRACT | @ 03:00:00 | NDC 60687040579

## 2020-04-19 MED ADMIN — IPRATROPIUM-ALBUTEROL 0.5 MG-3 MG(2.5 MG BASE)/3 ML IN NEBU [77459]: 3 mL | RESPIRATORY_TRACT | @ 23:00:00 | NDC 60687040579

## 2020-04-19 MED ADMIN — IPRATROPIUM-ALBUTEROL 0.5 MG-3 MG(2.5 MG BASE)/3 ML IN NEBU [77459]: 3 mL | RESPIRATORY_TRACT | @ 15:00:00 | NDC 60687040579

## 2020-04-19 MED ADMIN — POLYETHYLENE GLYCOL 3350 17 GRAM PO PWPK [25424]: 34 g | ORAL | @ 16:00:00 | NDC 00904693186

## 2020-04-20 ENCOUNTER — Encounter: Admit: 2020-04-20 | Discharge: 2020-04-20 | Payer: MEDICARE

## 2020-04-20 MED ADMIN — NYSTATIN 100,000 UNIT/ML PO SUSP [5751]: 500000 [IU] | ORAL | @ 15:00:00 | NDC 66689003701

## 2020-04-20 MED ADMIN — GABAPENTIN 400 MG PO CAP [18307]: 400 mg | ORAL | @ 04:00:00 | NDC 00904666761

## 2020-04-20 MED ADMIN — NYSTATIN 100,000 UNIT/ML PO SUSP [5751]: 500000 [IU] | ORAL | @ 04:00:00 | NDC 66689003701

## 2020-04-20 MED ADMIN — IPRATROPIUM-ALBUTEROL 0.5 MG-3 MG(2.5 MG BASE)/3 ML IN NEBU [77459]: 3 mL | RESPIRATORY_TRACT | @ 23:00:00 | NDC 60687040579

## 2020-04-20 MED ADMIN — PREDNISONE 10 MG PO TAB [6494]: 10 mg | ORAL | @ 15:00:00 | Stop: 2020-04-23 | NDC 00054001720

## 2020-04-20 MED ADMIN — BUMETANIDE 0.25 MG/ML IJ SOLN [9308]: 3 mg | INTRAVENOUS | NDC 70860040541

## 2020-04-20 MED ADMIN — DAPAGLIFLOZIN 5 MG PO TAB [320171]: 10 mg | ORAL | @ 15:00:00 | NDC 00310620530

## 2020-04-20 MED ADMIN — NYSTATIN 100,000 UNIT/ML PO SUSP [5751]: 500000 [IU] | ORAL | @ 20:00:00 | NDC 66689003701

## 2020-04-20 MED ADMIN — GABAPENTIN 400 MG PO CAP [18307]: 400 mg | ORAL | @ 15:00:00 | NDC 00904666761

## 2020-04-20 MED ADMIN — IPRATROPIUM-ALBUTEROL 0.5 MG-3 MG(2.5 MG BASE)/3 ML IN NEBU [77459]: 3 mL | RESPIRATORY_TRACT | @ 16:00:00 | NDC 60687040579

## 2020-04-20 MED ADMIN — RIVAROXABAN 20 MG PO TAB [309661]: 20 mg | ORAL | @ 23:00:00 | NDC 50458057901

## 2020-04-20 MED ADMIN — SENNOSIDES-DOCUSATE SODIUM 8.6-50 MG PO TAB [40926]: 2 | ORAL | @ 04:00:00 | NDC 60687062211

## 2020-04-20 MED ADMIN — SENNOSIDES-DOCUSATE SODIUM 8.6-50 MG PO TAB [40926]: 2 | ORAL | @ 15:00:00 | NDC 60687062211

## 2020-04-20 MED ADMIN — POLYETHYLENE GLYCOL 3350 17 GRAM PO PWPK [25424]: 34 g | ORAL | @ 15:00:00 | NDC 00904693186

## 2020-04-20 MED ADMIN — RIVAROXABAN 20 MG PO TAB [309661]: 20 mg | ORAL | @ 04:00:00 | NDC 50458057901

## 2020-04-20 MED ADMIN — GUAIFENESIN 600 MG PO TA12 [321343]: 600 mg | ORAL | @ 15:00:00 | NDC 68084057211

## 2020-04-20 MED ADMIN — NYSTATIN 100,000 UNIT/ML PO SUSP [5751]: 500000 [IU] | ORAL | @ 23:00:00 | NDC 66689003701

## 2020-04-20 MED ADMIN — GUAIFENESIN 600 MG PO TA12 [321343]: 600 mg | ORAL | @ 04:00:00 | NDC 68084057211

## 2020-04-20 MED ADMIN — BUMETANIDE 0.25 MG/ML IJ SOLN [9308]: 3 mg | INTRAVENOUS | @ 15:00:00 | NDC 70860040541

## 2020-04-20 MED ADMIN — IPRATROPIUM-ALBUTEROL 0.5 MG-3 MG(2.5 MG BASE)/3 ML IN NEBU [77459]: 3 mL | RESPIRATORY_TRACT | @ 19:00:00 | NDC 60687040579

## 2020-04-20 MED ADMIN — METOLAZONE 5 MG PO TAB [10588]: 5 mg | ORAL | @ 17:00:00 | Stop: 2020-04-20 | NDC 51079002401

## 2020-04-20 MED ADMIN — IPRATROPIUM-ALBUTEROL 0.5 MG-3 MG(2.5 MG BASE)/3 ML IN NEBU [77459]: 3 mL | RESPIRATORY_TRACT | @ 04:00:00 | NDC 60687040579

## 2020-04-21 ENCOUNTER — Encounter: Admit: 2020-04-21 | Discharge: 2020-04-21 | Payer: MEDICARE

## 2020-04-21 MED ADMIN — POLYETHYLENE GLYCOL 3350 17 GRAM PO PWPK [25424]: 34 g | ORAL | @ 14:00:00 | NDC 00904693186

## 2020-04-21 MED ADMIN — NYSTATIN 100,000 UNIT/ML PO SUSP [5751]: 500000 [IU] | ORAL | @ 23:00:00 | NDC 66689003701

## 2020-04-21 MED ADMIN — IPRATROPIUM-ALBUTEROL 0.5 MG-3 MG(2.5 MG BASE)/3 ML IN NEBU [77459]: 3 mL | RESPIRATORY_TRACT | @ 18:00:00 | NDC 60687040579

## 2020-04-21 MED ADMIN — GUAIFENESIN 600 MG PO TA12 [321343]: 600 mg | ORAL | @ 03:00:00 | NDC 68084057211

## 2020-04-21 MED ADMIN — BUMETANIDE 0.25 MG/ML IJ SOLN [9308]: 3 mg | INTRAVENOUS | @ 23:00:00 | NDC 70860040541

## 2020-04-21 MED ADMIN — PREDNISONE 10 MG PO TAB [6494]: 10 mg | ORAL | @ 14:00:00 | Stop: 2020-04-23 | NDC 00054001720

## 2020-04-21 MED ADMIN — IPRATROPIUM-ALBUTEROL 0.5 MG-3 MG(2.5 MG BASE)/3 ML IN NEBU [77459]: 3 mL | RESPIRATORY_TRACT | @ 03:00:00 | NDC 60687040579

## 2020-04-21 MED ADMIN — AMITRIPTYLINE(#)/GABAPENTIN/EMU OIL 4/4/10%IN HRT TOPICAL CRM [212271]: TOPICAL | @ 05:00:00 | NDC 54029302509

## 2020-04-21 MED ADMIN — GUAIFENESIN 600 MG PO TA12 [321343]: 600 mg | ORAL | @ 14:00:00 | NDC 68084057211

## 2020-04-21 MED ADMIN — BUMETANIDE 0.25 MG/ML IJ SOLN [9308]: 3 mg | INTRAVENOUS | @ 14:00:00 | NDC 00409141234

## 2020-04-21 MED ADMIN — GABAPENTIN 400 MG PO CAP [18307]: 400 mg | ORAL | @ 14:00:00 | NDC 00904666761

## 2020-04-21 MED ADMIN — POTASSIUM CHLORIDE 20 MEQ PO TBTQ [35943]: 40 meq | ORAL | @ 14:00:00 | Stop: 2020-04-21 | NDC 00245531989

## 2020-04-21 MED ADMIN — IPRATROPIUM-ALBUTEROL 0.5 MG-3 MG(2.5 MG BASE)/3 ML IN NEBU [77459]: 3 mL | RESPIRATORY_TRACT | @ 15:00:00 | NDC 60687040579

## 2020-04-21 MED ADMIN — NYSTATIN 100,000 UNIT/ML PO SUSP [5751]: 500000 [IU] | ORAL | @ 14:00:00 | NDC 66689003701

## 2020-04-21 MED ADMIN — IPRATROPIUM-ALBUTEROL 0.5 MG-3 MG(2.5 MG BASE)/3 ML IN NEBU [77459]: 3 mL | RESPIRATORY_TRACT | @ 23:00:00 | NDC 60687040579

## 2020-04-21 MED ADMIN — SENNOSIDES-DOCUSATE SODIUM 8.6-50 MG PO TAB [40926]: 2 | ORAL | @ 03:00:00 | NDC 60687062211

## 2020-04-21 MED ADMIN — GABAPENTIN 400 MG PO CAP [18307]: 400 mg | ORAL | @ 03:00:00 | NDC 00904666761

## 2020-04-21 MED ADMIN — DAPAGLIFLOZIN 5 MG PO TAB [320171]: 10 mg | ORAL | @ 14:00:00 | NDC 00310620530

## 2020-04-21 MED ADMIN — NYSTATIN 100,000 UNIT/ML PO SUSP [5751]: 500000 [IU] | ORAL | @ 18:00:00 | NDC 66689003701

## 2020-04-21 MED ADMIN — SENNOSIDES-DOCUSATE SODIUM 8.6-50 MG PO TAB [40926]: 2 | ORAL | @ 14:00:00 | NDC 60687062211

## 2020-04-21 MED ADMIN — NYSTATIN 100,000 UNIT/ML PO SUSP [5751]: 500000 [IU] | ORAL | @ 03:00:00 | NDC 66689003701

## 2020-04-21 MED ADMIN — RIVAROXABAN 20 MG PO TAB [309661]: 20 mg | ORAL | @ 23:00:00 | NDC 50458057901

## 2020-04-22 ENCOUNTER — Encounter: Admit: 2020-04-22 | Discharge: 2020-04-22 | Payer: MEDICARE

## 2020-04-22 DIAGNOSIS — Z8614 Personal history of Methicillin resistant Staphylococcus aureus infection: Secondary | ICD-10-CM

## 2020-04-22 DIAGNOSIS — J45909 Unspecified asthma, uncomplicated: Secondary | ICD-10-CM

## 2020-04-22 DIAGNOSIS — I714 Abdominal aortic aneurysm, without rupture: Secondary | ICD-10-CM

## 2020-04-22 DIAGNOSIS — N183 CKD (chronic kidney disease) stage 3, GFR 30-59 ml/min (HCC): Secondary | ICD-10-CM

## 2020-04-22 DIAGNOSIS — Z95828 Presence of other vascular implants and grafts: Secondary | ICD-10-CM

## 2020-04-22 DIAGNOSIS — R053 Chronic cough: Secondary | ICD-10-CM

## 2020-04-22 DIAGNOSIS — N401 Enlarged prostate with lower urinary tract symptoms: Secondary | ICD-10-CM

## 2020-04-22 DIAGNOSIS — M954 Acquired deformity of chest and rib: Secondary | ICD-10-CM

## 2020-04-22 DIAGNOSIS — I2510 Atherosclerotic heart disease of native coronary artery without angina pectoris: Secondary | ICD-10-CM

## 2020-04-22 DIAGNOSIS — E782 Mixed hyperlipidemia: Secondary | ICD-10-CM

## 2020-04-22 DIAGNOSIS — I429 Cardiomyopathy, unspecified: Secondary | ICD-10-CM

## 2020-04-22 DIAGNOSIS — R0600 Dyspnea, unspecified: Secondary | ICD-10-CM

## 2020-04-22 DIAGNOSIS — Z9981 Dependence on supplemental oxygen: Secondary | ICD-10-CM

## 2020-04-22 DIAGNOSIS — J479 Bronchiectasis, uncomplicated: Secondary | ICD-10-CM

## 2020-04-22 DIAGNOSIS — J449 Chronic obstructive pulmonary disease, unspecified: Secondary | ICD-10-CM

## 2020-04-22 DIAGNOSIS — I5042 Chronic combined systolic (congestive) and diastolic (congestive) heart failure: Secondary | ICD-10-CM

## 2020-04-22 DIAGNOSIS — J302 Other seasonal allergic rhinitis: Secondary | ICD-10-CM

## 2020-04-22 DIAGNOSIS — R609 Edema, unspecified: Secondary | ICD-10-CM

## 2020-04-22 DIAGNOSIS — I10 Essential (primary) hypertension: Secondary | ICD-10-CM

## 2020-04-22 DIAGNOSIS — J189 Pneumonia, unspecified organism: Secondary | ICD-10-CM

## 2020-04-22 DIAGNOSIS — K219 Gastro-esophageal reflux disease without esophagitis: Secondary | ICD-10-CM

## 2020-04-22 DIAGNOSIS — Z8679 Personal history of other diseases of the circulatory system: Secondary | ICD-10-CM

## 2020-04-22 DIAGNOSIS — M961 Postlaminectomy syndrome, not elsewhere classified: Secondary | ICD-10-CM

## 2020-04-22 DIAGNOSIS — I509 Heart failure, unspecified: Secondary | ICD-10-CM

## 2020-04-22 DIAGNOSIS — G4733 Obstructive sleep apnea (adult) (pediatric): Secondary | ICD-10-CM

## 2020-04-22 DIAGNOSIS — L03116 Cellulitis of left lower limb: Secondary | ICD-10-CM

## 2020-04-22 MED ADMIN — PREDNISONE 10 MG PO TAB [6494]: 10 mg | ORAL | @ 15:00:00 | Stop: 2020-04-22 | NDC 00054001720

## 2020-04-22 MED ADMIN — SENNOSIDES-DOCUSATE SODIUM 8.6-50 MG PO TAB [40926]: 2 | ORAL | @ 15:00:00 | Stop: 2020-04-22 | NDC 60687062211

## 2020-04-22 MED ADMIN — NYSTATIN 100,000 UNIT/ML PO SUSP [5751]: 500000 [IU] | ORAL | @ 19:00:00 | NDC 66689003701

## 2020-04-22 MED ADMIN — RIVAROXABAN 20 MG PO TAB [309661]: 20 mg | ORAL | @ 23:00:00 | NDC 50458057901

## 2020-04-22 MED ADMIN — IPRATROPIUM-ALBUTEROL 0.5 MG-3 MG(2.5 MG BASE)/3 ML IN NEBU [77459]: 3 mL | RESPIRATORY_TRACT | @ 02:00:00 | NDC 60687040579

## 2020-04-22 MED ADMIN — BUMETANIDE 2 MG PO TAB [9311]: 3 mg | ORAL | @ 19:00:00 | Stop: 2020-04-22 | NDC 60687053511

## 2020-04-22 MED ADMIN — IPRATROPIUM-ALBUTEROL 0.5 MG-3 MG(2.5 MG BASE)/3 ML IN NEBU [77459]: 3 mL | RESPIRATORY_TRACT | @ 19:00:00 | NDC 60687040579

## 2020-04-22 MED ADMIN — GABAPENTIN 400 MG PO CAP [18307]: 400 mg | ORAL | @ 15:00:00 | NDC 00904666761

## 2020-04-22 MED ADMIN — GUAIFENESIN 600 MG PO TA12 [321343]: 600 mg | ORAL | @ 15:00:00 | NDC 68084057211

## 2020-04-22 MED ADMIN — IPRATROPIUM-ALBUTEROL 0.5 MG-3 MG(2.5 MG BASE)/3 ML IN NEBU [77459]: 3 mL | RESPIRATORY_TRACT | @ 23:00:00 | NDC 60687040579

## 2020-04-22 MED ADMIN — SENNOSIDES-DOCUSATE SODIUM 8.6-50 MG PO TAB [40926]: 2 | ORAL | @ 04:00:00 | NDC 60687062211

## 2020-04-22 MED ADMIN — GUAIFENESIN 600 MG PO TA12 [321343]: 600 mg | ORAL | @ 04:00:00 | NDC 68084057211

## 2020-04-22 MED ADMIN — DAPAGLIFLOZIN 5 MG PO TAB [320171]: 10 mg | ORAL | @ 15:00:00 | NDC 00310620530

## 2020-04-22 MED ADMIN — NYSTATIN 100,000 UNIT/ML PO SUSP [5751]: 500000 [IU] | ORAL | @ 04:00:00 | NDC 66689003701

## 2020-04-22 MED ADMIN — GABAPENTIN 400 MG PO CAP [18307]: 400 mg | ORAL | @ 04:00:00 | NDC 00904666761

## 2020-04-22 MED ADMIN — NYSTATIN 100,000 UNIT/ML PO SUSP [5751]: 500000 [IU] | ORAL | @ 23:00:00 | NDC 66689003701

## 2020-04-22 MED ADMIN — NYSTATIN 100,000 UNIT/ML PO SUSP [5751]: 500000 [IU] | ORAL | @ 15:00:00 | NDC 66689003701

## 2020-04-22 MED ADMIN — IPRATROPIUM-ALBUTEROL 0.5 MG-3 MG(2.5 MG BASE)/3 ML IN NEBU [77459]: 3 mL | RESPIRATORY_TRACT | @ 15:00:00 | NDC 60687040579

## 2020-04-23 ENCOUNTER — Encounter: Admit: 2020-04-23 | Discharge: 2020-04-23 | Payer: MEDICARE

## 2020-04-23 ENCOUNTER — Inpatient Hospital Stay: Admit: 2020-04-23 | Discharge: 2020-04-23 | Payer: MEDICARE

## 2020-04-23 MED ADMIN — GUAIFENESIN 600 MG PO TA12 [321343]: 600 mg | ORAL | @ 03:00:00 | NDC 68084057211

## 2020-04-23 MED ADMIN — BUMETANIDE 2 MG PO TAB [9311]: 3 mg | ORAL | @ 16:00:00 | NDC 50268013211

## 2020-04-23 MED ADMIN — NYSTATIN 100,000 UNIT/ML PO SUSP [5751]: 500000 [IU] | ORAL | @ 03:00:00 | NDC 66689003701

## 2020-04-23 MED ADMIN — IPRATROPIUM-ALBUTEROL 0.5 MG-3 MG(2.5 MG BASE)/3 ML IN NEBU [77459]: 3 mL | RESPIRATORY_TRACT | @ 04:00:00 | NDC 60687040579

## 2020-04-23 MED ADMIN — NYSTATIN 100,000 UNIT/ML PO SUSP [5751]: 500000 [IU] | ORAL | NDC 66689003701

## 2020-04-23 MED ADMIN — GABAPENTIN 400 MG PO CAP [18307]: 400 mg | ORAL | @ 03:00:00 | NDC 00904666761

## 2020-04-23 MED ADMIN — NYSTATIN 100,000 UNIT/ML PO SUSP [5751]: 500000 [IU] | ORAL | @ 16:00:00 | NDC 66689003701

## 2020-04-23 MED ADMIN — RIVAROXABAN 20 MG PO TAB [309661]: 20 mg | ORAL | NDC 50458057901

## 2020-04-23 MED ADMIN — BUMETANIDE 2 MG PO TAB [9311]: 3 mg | ORAL | NDC 50268013211

## 2020-04-23 MED ADMIN — NYSTATIN 100,000 UNIT/ML PO SUSP [5751]: 500000 [IU] | ORAL | @ 18:00:00 | NDC 66689003701

## 2020-04-23 MED ADMIN — SENNOSIDES-DOCUSATE SODIUM 8.6-50 MG PO TAB [40926]: 1 | ORAL | @ 16:00:00 | NDC 60687062211

## 2020-04-23 MED ADMIN — SENNOSIDES-DOCUSATE SODIUM 8.6-50 MG PO TAB [40926]: 1 | ORAL | @ 03:00:00 | NDC 60687062211

## 2020-04-23 MED ADMIN — IPRATROPIUM-ALBUTEROL 0.5 MG-3 MG(2.5 MG BASE)/3 ML IN NEBU [77459]: 3 mL | RESPIRATORY_TRACT | @ 16:00:00 | NDC 60687040579

## 2020-04-23 MED ADMIN — GABAPENTIN 400 MG PO CAP [18307]: 400 mg | ORAL | @ 16:00:00 | NDC 00904666761

## 2020-04-23 MED ADMIN — GUAIFENESIN 600 MG PO TA12 [321343]: 600 mg | ORAL | @ 16:00:00 | NDC 68084057211

## 2020-04-23 MED ADMIN — DAPAGLIFLOZIN 5 MG PO TAB [320171]: 10 mg | ORAL | @ 16:00:00 | NDC 00310620530

## 2020-04-23 MED ADMIN — DICLOFENAC SODIUM 1 % TP GEL [168032]: 2 g | TOPICAL | @ 21:00:00 | NDC 45802095301

## 2020-04-23 MED ADMIN — IPRATROPIUM-ALBUTEROL 0.5 MG-3 MG(2.5 MG BASE)/3 ML IN NEBU [77459]: 3 mL | RESPIRATORY_TRACT | @ 23:00:00 | NDC 60687040579

## 2020-04-23 MED ADMIN — IPRATROPIUM-ALBUTEROL 0.5 MG-3 MG(2.5 MG BASE)/3 ML IN NEBU [77459]: 3 mL | RESPIRATORY_TRACT | @ 19:00:00 | NDC 60687040579

## 2020-04-24 ENCOUNTER — Encounter: Admit: 2020-04-24 | Discharge: 2020-04-24 | Payer: MEDICARE

## 2020-04-24 MED ADMIN — POTASSIUM CHLORIDE 20 MEQ PO TBTQ [35943]: 40 meq | ORAL | @ 18:00:00 | Stop: 2020-04-24 | NDC 00245531989

## 2020-04-24 MED ADMIN — BUMETANIDE 2 MG PO TAB [9311]: 3 mg | ORAL | NDC 50268013211

## 2020-04-24 MED ADMIN — SENNOSIDES-DOCUSATE SODIUM 8.6-50 MG PO TAB [40926]: 1 | ORAL | @ 03:00:00 | NDC 60687062211

## 2020-04-24 MED ADMIN — AMITRIPTYLINE(#)/GABAPENTIN/EMU OIL 4/4/10%IN HRT TOPICAL CRM [212271]: TOPICAL | @ 22:00:00 | NDC 54029302509

## 2020-04-24 MED ADMIN — IPRATROPIUM-ALBUTEROL 0.5 MG-3 MG(2.5 MG BASE)/3 ML IN NEBU [77459]: 3 mL | RESPIRATORY_TRACT | @ 23:00:00 | NDC 60687040579

## 2020-04-24 MED ADMIN — NYSTATIN 100,000 UNIT/ML PO SUSP [5751]: 500000 [IU] | ORAL | @ 03:00:00 | NDC 66689003701

## 2020-04-24 MED ADMIN — SENNOSIDES-DOCUSATE SODIUM 8.6-50 MG PO TAB [40926]: 1 | ORAL | @ 15:00:00 | NDC 70000052601

## 2020-04-24 MED ADMIN — CEFAZOLIN INJ 1GM IVP [210319]: 2 g | INTRAVENOUS | NDC 60505614200

## 2020-04-24 MED ADMIN — RIVAROXABAN 20 MG PO TAB [309661]: 20 mg | ORAL | NDC 50458057901

## 2020-04-24 MED ADMIN — IPRATROPIUM-ALBUTEROL 0.5 MG-3 MG(2.5 MG BASE)/3 ML IN NEBU [77459]: 3 mL | RESPIRATORY_TRACT | @ 15:00:00 | NDC 60687040579

## 2020-04-24 MED ADMIN — GABAPENTIN 400 MG PO CAP [18307]: 400 mg | ORAL | @ 15:00:00 | NDC 00904666761

## 2020-04-24 MED ADMIN — IPRATROPIUM-ALBUTEROL 0.5 MG-3 MG(2.5 MG BASE)/3 ML IN NEBU [77459]: 3 mL | RESPIRATORY_TRACT | @ 03:00:00 | NDC 60687040579

## 2020-04-24 MED ADMIN — NYSTATIN 100,000 UNIT/ML PO SUSP [5751]: 500000 [IU] | ORAL | @ 15:00:00 | NDC 66689003701

## 2020-04-24 MED ADMIN — GUAIFENESIN 600 MG PO TA12 [321343]: 600 mg | ORAL | @ 03:00:00 | NDC 68084057211

## 2020-04-24 MED ADMIN — GUAIFENESIN 600 MG PO TA12 [321343]: 600 mg | ORAL | @ 15:00:00 | NDC 68084057211

## 2020-04-24 MED ADMIN — NYSTATIN 100,000 UNIT/ML PO SUSP [5751]: 500000 [IU] | ORAL | @ 20:00:00 | NDC 66689003701

## 2020-04-24 MED ADMIN — DAPAGLIFLOZIN 5 MG PO TAB [320171]: 10 mg | ORAL | @ 16:00:00 | NDC 00310620530

## 2020-04-24 MED ADMIN — IPRATROPIUM-ALBUTEROL 0.5 MG-3 MG(2.5 MG BASE)/3 ML IN NEBU [77459]: 3 mL | RESPIRATORY_TRACT | @ 20:00:00 | NDC 60687040579

## 2020-04-24 MED ADMIN — BUMETANIDE 2 MG PO TAB [9311]: 3 mg | ORAL | @ 15:00:00 | NDC 50268013211

## 2020-04-24 MED ADMIN — NYSTATIN 100,000 UNIT/ML PO SUSP [5751]: 500000 [IU] | ORAL | NDC 66689003701

## 2020-04-24 MED ADMIN — GABAPENTIN 400 MG PO CAP [18307]: 400 mg | ORAL | @ 03:00:00 | NDC 00904666761

## 2020-04-25 ENCOUNTER — Encounter: Admit: 2020-04-25 | Discharge: 2020-04-25 | Payer: MEDICARE

## 2020-04-25 MED ADMIN — IPRATROPIUM-ALBUTEROL 0.5 MG-3 MG(2.5 MG BASE)/3 ML IN NEBU [77459]: 3 mL | RESPIRATORY_TRACT | @ 19:00:00 | NDC 60687040579

## 2020-04-25 MED ADMIN — IPRATROPIUM-ALBUTEROL 0.5 MG-3 MG(2.5 MG BASE)/3 ML IN NEBU [77459]: 3 mL | RESPIRATORY_TRACT | @ 23:00:00 | NDC 60687040579

## 2020-04-25 MED ADMIN — CEFAZOLIN INJ 1GM IVP [210319]: 2 g | INTRAVENOUS | NDC 60505614200

## 2020-04-25 MED ADMIN — NYSTATIN 100,000 UNIT/ML PO SUSP [5751]: 500000 [IU] | ORAL | @ 03:00:00 | NDC 66689003701

## 2020-04-25 MED ADMIN — GUAIFENESIN 600 MG PO TA12 [321343]: 600 mg | ORAL | @ 16:00:00 | NDC 68084057211

## 2020-04-25 MED ADMIN — DAPAGLIFLOZIN 5 MG PO TAB [320171]: 10 mg | ORAL | @ 16:00:00 | NDC 00310620530

## 2020-04-25 MED ADMIN — GABAPENTIN 400 MG PO CAP [18307]: 400 mg | ORAL | @ 16:00:00 | NDC 00904666761

## 2020-04-25 MED ADMIN — GABAPENTIN 400 MG PO CAP [18307]: 400 mg | ORAL | @ 03:00:00 | NDC 00904666761

## 2020-04-25 MED ADMIN — NYSTATIN 100,000 UNIT/ML PO SUSP [5751]: 500000 [IU] | ORAL | @ 21:00:00 | NDC 66689003701

## 2020-04-25 MED ADMIN — BUMETANIDE 2 MG PO TAB [9311]: 3 mg | ORAL | @ 16:00:00 | NDC 50268013211

## 2020-04-25 MED ADMIN — NYSTATIN 100,000 UNIT/ML PO SUSP [5751]: 500000 [IU] | ORAL | @ 16:00:00 | NDC 66689003701

## 2020-04-25 MED ADMIN — CEFAZOLIN INJ 1GM IVP [210319]: 2 g | INTRAVENOUS | @ 08:00:00 | NDC 60505614200

## 2020-04-25 MED ADMIN — IPRATROPIUM-ALBUTEROL 0.5 MG-3 MG(2.5 MG BASE)/3 ML IN NEBU [77459]: 3 mL | RESPIRATORY_TRACT | @ 15:00:00 | NDC 60687040579

## 2020-04-25 MED ADMIN — SENNOSIDES-DOCUSATE SODIUM 8.6-50 MG PO TAB [40926]: 1 | ORAL | @ 16:00:00 | NDC 70000052601

## 2020-04-25 MED ADMIN — RIVAROXABAN 20 MG PO TAB [309661]: 20 mg | ORAL | NDC 50458057901

## 2020-04-25 MED ADMIN — WATER FOR INJECTION, STERILE IJ SOLN [79513]: 20 mL | INTRAVENOUS | @ 16:00:00 | Stop: 2020-04-25 | NDC 00409488723

## 2020-04-25 MED ADMIN — CEFAZOLIN INJ 1GM IVP [210319]: 2 g | INTRAVENOUS | @ 16:00:00 | NDC 60505614200

## 2020-04-25 MED ADMIN — IPRATROPIUM-ALBUTEROL 0.5 MG-3 MG(2.5 MG BASE)/3 ML IN NEBU [77459]: 3 mL | RESPIRATORY_TRACT | @ 04:00:00 | NDC 60687040579

## 2020-04-25 MED ADMIN — WATER FOR INJECTION, STERILE IJ SOLN [79513]: INTRAVENOUS | @ 09:00:00 | Stop: 2020-04-25 | NDC 00409488723

## 2020-04-25 MED ADMIN — POLYETHYLENE GLYCOL 3350 17 GRAM PO PWPK [25424]: 34 g | ORAL | @ 16:00:00 | NDC 00904693186

## 2020-04-25 MED ADMIN — BUMETANIDE 2 MG PO TAB [9311]: 3 mg | ORAL | NDC 50268013211

## 2020-04-25 MED ADMIN — GUAIFENESIN 600 MG PO TA12 [321343]: 600 mg | ORAL | @ 03:00:00 | NDC 68084057211

## 2020-04-25 MED ADMIN — SENNOSIDES-DOCUSATE SODIUM 8.6-50 MG PO TAB [40926]: 1 | ORAL | @ 03:00:00 | NDC 70000052601

## 2020-04-26 ENCOUNTER — Encounter: Admit: 2020-04-26 | Discharge: 2020-04-26 | Payer: MEDICARE

## 2020-04-26 MED ADMIN — CEFAZOLIN INJ 1GM IVP [210319]: 2 g | INTRAVENOUS | @ 15:00:00 | Stop: 2020-04-26 | NDC 60505614200

## 2020-04-26 MED ADMIN — BUMETANIDE 2 MG PO TAB [9311]: 3 mg | ORAL | @ 15:00:00 | Stop: 2020-04-26 | NDC 50268013211

## 2020-04-26 MED ADMIN — BUMETANIDE 0.25 MG/ML IJ SOLN [9308]: 1 mg | INTRAVENOUS | @ 03:00:00 | Stop: 2020-04-26 | NDC 70860040541

## 2020-04-26 MED ADMIN — NYSTATIN 100,000 UNIT/ML PO SUSP [5751]: 500000 [IU] | ORAL | NDC 66689003701

## 2020-04-26 MED ADMIN — POTASSIUM CHLORIDE 20 MEQ PO TBTQ [35943]: 20 meq | ORAL | @ 03:00:00 | Stop: 2020-04-26 | NDC 00245531989

## 2020-04-26 MED ADMIN — NYSTATIN 100,000 UNIT/ML PO SUSP [5751]: 500000 [IU] | ORAL | @ 18:00:00 | Stop: 2020-04-26 | NDC 66689003701

## 2020-04-26 MED ADMIN — IPRATROPIUM-ALBUTEROL 0.5 MG-3 MG(2.5 MG BASE)/3 ML IN NEBU [77459]: 3 mL | RESPIRATORY_TRACT | @ 19:00:00 | Stop: 2020-04-26 | NDC 60687040579

## 2020-04-26 MED ADMIN — GABAPENTIN 400 MG PO CAP [18307]: 400 mg | ORAL | @ 02:00:00 | NDC 00904666761

## 2020-04-26 MED ADMIN — IPRATROPIUM-ALBUTEROL 0.5 MG-3 MG(2.5 MG BASE)/3 ML IN NEBU [77459]: 3 mL | RESPIRATORY_TRACT | @ 02:00:00 | NDC 60687040579

## 2020-04-26 MED ADMIN — DAPAGLIFLOZIN 5 MG PO TAB [320171]: 10 mg | ORAL | @ 15:00:00 | Stop: 2020-04-26 | NDC 00310620530

## 2020-04-26 MED ADMIN — GABAPENTIN 400 MG PO CAP [18307]: 400 mg | ORAL | @ 15:00:00 | Stop: 2020-04-26 | NDC 00904666761

## 2020-04-26 MED ADMIN — NYSTATIN 100,000 UNIT/ML PO SUSP [5751]: 500000 [IU] | ORAL | @ 15:00:00 | Stop: 2020-04-26 | NDC 66689003701

## 2020-04-26 MED ADMIN — SENNOSIDES-DOCUSATE SODIUM 8.6-50 MG PO TAB [40926]: 1 | ORAL | @ 02:00:00 | NDC 70000052601

## 2020-04-26 MED ADMIN — GUAIFENESIN 600 MG PO TA12 [321343]: 600 mg | ORAL | @ 02:00:00 | NDC 68084057211

## 2020-04-26 MED ADMIN — GUAIFENESIN 600 MG PO TA12 [321343]: 600 mg | ORAL | @ 15:00:00 | Stop: 2020-04-26 | NDC 68084057211

## 2020-04-26 MED ADMIN — BUMETANIDE 2 MG PO TAB [9311]: 3 mg | ORAL | @ 15:00:00 | Stop: 2020-04-26 | NDC 60687053511

## 2020-04-26 MED ADMIN — SODIUM CHLORIDE 3 % IN NEBU [7327]: 4 mL | RESPIRATORY_TRACT | @ 15:00:00 | Stop: 2020-04-26 | NDC 00487900360

## 2020-04-26 MED ADMIN — SENNOSIDES-DOCUSATE SODIUM 8.6-50 MG PO TAB [40926]: 1 | ORAL | @ 15:00:00 | Stop: 2020-04-26 | NDC 60687062211

## 2020-04-26 MED ADMIN — CEFAZOLIN INJ 1GM IVP [210319]: 2 g | INTRAVENOUS | @ 08:00:00 | Stop: 2020-04-26 | NDC 60505614200

## 2020-04-26 MED ADMIN — IPRATROPIUM-ALBUTEROL 0.5 MG-3 MG(2.5 MG BASE)/3 ML IN NEBU [77459]: 3 mL | RESPIRATORY_TRACT | @ 15:00:00 | Stop: 2020-04-26 | NDC 60687040579

## 2020-04-26 MED ADMIN — NYSTATIN 100,000 UNIT/ML PO SUSP [5751]: 500000 [IU] | ORAL | @ 02:00:00 | NDC 66689003701

## 2020-04-26 MED FILL — BUMETANIDE 1 MG PO TAB: 1 mg | ORAL | 30 days supply | Qty: 180 | Fill #1 | Status: CP

## 2020-04-26 MED FILL — GABAPENTIN 100 MG PO CAP: 100 mg | ORAL | 11 days supply | Qty: 90 | Fill #1 | Status: CP

## 2020-04-26 MED FILL — METOLAZONE 2.5 MG PO TAB: 2.5 mg | ORAL | 30 days supply | Qty: 90 | Fill #1 | Status: CP

## 2020-04-26 MED FILL — ARFORMOTEROL 15 MCG/2 ML IN NEBU: 15 mcg/2 mL | RESPIRATORY_TRACT | 30 days supply | Qty: 120 | Fill #1 | Status: CP

## 2020-04-26 MED FILL — PEN NEEDLE, DIABETIC 31 GAUGE X 3/16" MISC NDLE: 31 gauge x 3/16" | 25 days supply | Qty: 100 | Fill #1 | Status: CP

## 2020-04-26 MED FILL — INSULIN GLARGINE 100 UNIT/ML (3 ML) SC INJ PEN: 100 unit/mL (3 mL) | SUBCUTANEOUS | 100 days supply | Qty: 15 | Fill #1 | Status: CP

## 2020-04-26 MED FILL — RIVAROXABAN 20 MG PO TAB: 20 mg | ORAL | 30 days supply | Qty: 90 | Fill #1 | Status: CP

## 2020-04-26 MED FILL — INSULIN ASPART 100 UNIT/ML SC FLEXPEN: 100 unit/mL (3 mL) | SUBCUTANEOUS | 72 days supply | Qty: 45 | Fill #1 | Status: CP

## 2020-04-26 MED FILL — CEPHALEXIN 500 MG PO CAP: 500 mg | ORAL | 5 days supply | Qty: 20 | Fill #1 | Status: CP

## 2020-04-27 ENCOUNTER — Encounter: Admit: 2020-04-27 | Discharge: 2020-04-27 | Payer: MEDICARE

## 2020-04-27 NOTE — Telephone Encounter
Hospital Discharge Follow Up      Reached Patient:Yes     Admission Information:     Hospital Name: [facility]  Admission Date: 04/02/20  Discharge Date: 04/26/20  Admission Diagnosis: Shortness of breath / cough  Discharge Diagnosis: acute on chronic hypoxia secondary to asthma exacerbation, dyspnea  Has there been a discharge within the last 30 days? Yes  If yes, reason: admitted 03/05/20 for asthma exacerbation  Hospital Services: Unplanned  Today's call is 1(business) days post discharge      Discharge Instruction Review   Did patient receive and understand discharge instructions? Yes    Home Health ordered? No                 Agency name/telephone number: N/A   Has Home Health agency contacted patient? No   Caregiver assistance in the home? No   Are there concerns regarding the patient's ADL'S? No  Is patient a fall risk? Yes    Special diet? Yes If yes, type: cardiac diet, fluid restriction      Medication Reconciliation    Changes to pre-hospital medications? Yes   START taking:  cephalexin (KEFLEX)  insulin aspart (U-100) (NOVOLOG FLEXPEN U-100  INSULIN)  insulin glargine (LANTUS SOLOSTAR U-100 INSULIN)  metOLazone (ZAROXOLYN)  polyethylene glycol 3350 (MIRALAX)  rivaroxaban (XARELTO)  senna/docusate (SENOKOT-S)  CHANGE how you take:  bumetanide (BUMEX)  Were new prescriptions filled?Yes  Meds reviewed and reconciled?Yes  ? acetaminophen (TYLENOL) 325 mg tablet Take two tablets by mouth every 6 hours as needed for Pain.   ? albuterol 0.083% (PROVENTIL) 2.5 mg /3 mL (0.083 %) nebulizer solution Inhale 3 mL solution by nebulizer as directed every 6 hours as needed for Wheezing or Shortness of Breath. Indications: asthma   ? albuterol sulfate (PROAIR HFA) 90 mcg/actuation HFA aerosol inhaler Inhale two puffs by mouth into the lungs every 4 hours as needed for Wheezing or Shortness of Breath.   ? allopurinoL (ZYLOPRIM) 300 mg tablet Take 300 mg by mouth daily. Take with food.   ? AMITR/GABAPEN/EMU OIL 05/24/08% CREAM (COMPOUND) Apply topically to affected area three times daily as needed.   ? arformoteroL (BROVANA) 15 mcg/2 mL nebulizer solution Inhale 2 mL solution by nebulizer as directed twice daily. Dx: J44.9   ? ASCORBIC ACID-ASCORBATE SODIUM PO Take 500 mg by mouth every morning.   ? aspirin EC 81 mg tablet Take one tablet by mouth daily. Take with food.   ? atorvastatin (LIPITOR) 40 mg tablet Take one tablet by mouth daily.   ? benzonatate (TESSALON PERLES) 100 mg capsule Take one capsule by mouth every 8 hours.   ? budesonide (PULMICORT) 0.25 mg/2 mL nebulizer solution Inhale 2 mL solution by nebulizer as directed twice daily. Dx: J44.9   ? bumetanide (BUMEX) 1 mg tablet Take three tablets by mouth twice daily.   ? cephalexin (KEFLEX) 500 mg capsule Take two capsules by mouth every 12 hours for 5 days.   ? cholecalciferol (VITAMIN D-3) 1,000 units tablet Take 1,000 Units by mouth daily.   ? duloxetine DR (CYMBALTA) 60 mg capsule Take one capsule by mouth daily.   ? empagliflozin (JARDIANCE) 25 mg tablet Take one tablet by mouth daily.   ? finasteride (PROSCAR) 5 mg tablet Take one tablet by mouth daily.   ? fish oil- omega 3-DHA/EPA 300/1,000 mg capsule Take 1 capsule by mouth daily.   ? gabapentin (NEURONTIN) 100 mg capsule Take four capsules by mouth twice daily.   ?  insulin aspart (U-100) (NOVOLOG FLEXPEN U-100 INSULIN) 100 unit/mL (3 mL) PEN Inject seven Units under the skin three times daily with meals.   ? insulin glargine (LANTUS SOLOSTAR U-100 INSULIN) 100 unit/mL (3 mL) subcutaneous PEN Inject fifteen Units under the skin at bedtime daily.   ? insulin pen needles (disposable) (BD ULTRA-FINE MINI PEN NEEDLE) 31 gauge x 3/16 pen needle Use with insulin pens   ? ipratropium bromide (ATROVENT) 21 mcg (0.03 %) nasal spray Apply two sprays to each nostril as directed every 12 hours.   ? metOLazone (ZAROXOLYN) 2.5 mg tablet Take one tablet by mouth daily as needed. PER OUTPATIENT CARDIOLOGY PROVIDER AS NEEDED FOR FLUID RETENTION   ? Miscellaneous Medical Supply misc Rx: Please wrap legs with ACE bandage from toes as high up to the leg as possible bilaterally  Dx: Lymphedema   ? montelukast (SINGULAIR) 10 mg tablet Take one tablet by mouth at bedtime daily.   ? nitroglycerin (NITROSTAT) 0.4 mg tablet Place 0.4 mg under tongue every 5 minutes as needed for Chest Pain. Max of 3 tablets, call 911.   ? oxyCODONE (ROXICODONE) 5 mg tablet Take one tablet to two tablets by mouth every 8 hours as needed for Pain   ? pantoprazole DR (PROTONIX) 40 mg tablet Take one tablet by mouth twice daily.   ? polyethylene glycol 3350 (MIRALAX) 17 g packet Take two packets by mouth twice daily.   ? predniSONE (DELTASONE) 10 mg tablet Take 10 mg by mouth as Needed. Takes only when instructed by PCP for COPD exacerbations   ? rivaroxaban (XARELTO) 20 mg tablet Take one tablet by mouth daily with dinner. Take with food.   ? senna/docusate (SENOKOT-S) 8.6/50 mg tablet Take one tablet by mouth twice daily.   ? spironolactone (ALDACTONE) 25 mg tablet Take one tablet by mouth twice daily. Take with food.   ? tamsulosin (FLOMAX) 0.4 mg capsule TAKE (1) CAPSULE BY MOUTH ONCE DAILY.   ? zinc sulfate 220 mg (50 mg elemental zinc) capsule Take 220 mg by mouth daily.         Understanding Condition   Having any current symptoms? Yes, Shortness of breath  Patient understands when to seek additional medical care? Yes   Other instructions provided : resources, hospital follow up, medication changes      Scheduling Follow-up Appointment   Upcoming appointment date and time and with whom scheduled:   Future Appointments   Date Time Provider Department Center   04/29/2020  2:30 PM Anselm Lis, APRN-NP [facility] IM   05/03/2020  2:30 PM Eliberto Ivory, APRN-NP [id number] CVM Exam   05/13/2020 12:00 AM [facility]ITORING [facility] CVM Procedur   05/14/2020 11:20 AM Hyman Bower A, DO [facility] IM   06/01/2020 11:15 AM Raelyn Mora, PA-C [facility] Urology   06/14/2020 12:00 AM [facility] MONIT[facility] CVM Procedur   08/27/2020 10:40 AM Deveron Furlong, MD [facility] IM     PCP appointment scheduled?Yes, Date: 05/18/20- declines seeing another provider and this is first available   PCP primary location: [facility]  Specialist appointment scheduled? Yes, with pulmonary  04/29/20  Both PCP and Specialist appointment scheduled: Yes  Is assistance with transportation needed?No   MyChart message sent? Active in MyChart. No message sent.      Spoke with patient who states things are a little rough since being discharged. States I am just getting acclimated to being home. States he is using a  walker to get around, he is able to get around home without difficulty with walker and assistance from wife. States his shortness of air is the status quo right now. States he had some dizziness when he first got up this morning, states its gone now. Also reports a headache yesterday which is gone today. He denies any chest pain, nausea, vomiting, fevers, chills, blurred vision, or new numbness/tingling since being discharged. States he has no questions or concerns at this time, he knows to call if anything comes up.     Shelah Lewandowsky, RN

## 2020-04-28 ENCOUNTER — Encounter: Admit: 2020-04-28 | Discharge: 2020-04-28 | Payer: MEDICARE

## 2020-04-28 MED ORDER — NYSTATIN 100,000 UNIT/ML PO SUSP
500000 [IU] | Freq: Four times a day (QID) | ORAL | 1 refills | 12.00000 days | Status: AC
Start: 2020-04-28 — End: ?

## 2020-04-28 NOTE — Telephone Encounter
Called patient to schedule Watchman consult per referral from [facility], [place] (note dated 04/05/2020)  Patient had extended stay in hospital and waited until discharge to contact patient.  Talked to patient and his wife, Ruben Wood, to schedule appointment.  Patient scheduled via telehealth to speak with Dr. Betti Cruz on 05/31/2020 around other appointments  [name] verbalized understanding of date/time/telehealth

## 2020-04-28 NOTE — Telephone Encounter
Nystatin s/s sent to pharmacy

## 2020-04-28 NOTE — Telephone Encounter
I called patients wife to let her know rx was approved and sent to the pharmacy.    Etta Grandchild, RN

## 2020-04-28 NOTE — Telephone Encounter
Patient's wife requesting:medication for thrush  States patient was getting treatment for this in the hospital, but not sent home with rx to continue treating.  Patient still has thrush symptoms and food doesn't taste good to him.      Pended nystatin as ordered in hospital for Dr. Crecencio Mc to approve.  If ordered this morning they would like to go to [place] if afternoon [employer], RN

## 2020-04-29 ENCOUNTER — Ambulatory Visit: Admit: 2020-04-29 | Discharge: 2020-04-30 | Payer: MEDICARE

## 2020-04-29 ENCOUNTER — Encounter: Admit: 2020-04-29 | Discharge: 2020-04-29 | Payer: MEDICARE

## 2020-04-29 DIAGNOSIS — I509 Heart failure, unspecified: Secondary | ICD-10-CM

## 2020-04-29 DIAGNOSIS — J302 Other seasonal allergic rhinitis: Secondary | ICD-10-CM

## 2020-04-29 DIAGNOSIS — G4733 Obstructive sleep apnea (adult) (pediatric): Secondary | ICD-10-CM

## 2020-04-29 DIAGNOSIS — R053 Chronic cough: Secondary | ICD-10-CM

## 2020-04-29 DIAGNOSIS — L03116 Cellulitis of left lower limb: Secondary | ICD-10-CM

## 2020-04-29 DIAGNOSIS — J479 Bronchiectasis, uncomplicated: Secondary | ICD-10-CM

## 2020-04-29 DIAGNOSIS — M954 Acquired deformity of chest and rib: Secondary | ICD-10-CM

## 2020-04-29 DIAGNOSIS — I5042 Chronic combined systolic (congestive) and diastolic (congestive) heart failure: Secondary | ICD-10-CM

## 2020-04-29 DIAGNOSIS — Z8679 Personal history of other diseases of the circulatory system: Secondary | ICD-10-CM

## 2020-04-29 DIAGNOSIS — R0600 Dyspnea, unspecified: Secondary | ICD-10-CM

## 2020-04-29 DIAGNOSIS — J45909 Unspecified asthma, uncomplicated: Secondary | ICD-10-CM

## 2020-04-29 DIAGNOSIS — R609 Edema, unspecified: Secondary | ICD-10-CM

## 2020-04-29 DIAGNOSIS — I429 Cardiomyopathy, unspecified: Secondary | ICD-10-CM

## 2020-04-29 DIAGNOSIS — N183 CKD (chronic kidney disease) stage 3, GFR 30-59 ml/min (HCC): Secondary | ICD-10-CM

## 2020-04-29 DIAGNOSIS — I714 Abdominal aortic aneurysm, without rupture: Secondary | ICD-10-CM

## 2020-04-29 DIAGNOSIS — Z8614 Personal history of Methicillin resistant Staphylococcus aureus infection: Secondary | ICD-10-CM

## 2020-04-29 DIAGNOSIS — J189 Pneumonia, unspecified organism: Secondary | ICD-10-CM

## 2020-04-29 DIAGNOSIS — I10 Essential (primary) hypertension: Secondary | ICD-10-CM

## 2020-04-29 DIAGNOSIS — J449 Chronic obstructive pulmonary disease, unspecified: Secondary | ICD-10-CM

## 2020-04-29 DIAGNOSIS — Z95828 Presence of other vascular implants and grafts: Secondary | ICD-10-CM

## 2020-04-29 DIAGNOSIS — I2510 Atherosclerotic heart disease of native coronary artery without angina pectoris: Secondary | ICD-10-CM

## 2020-04-29 DIAGNOSIS — Z7189 Other specified counseling: Secondary | ICD-10-CM

## 2020-04-29 DIAGNOSIS — E782 Mixed hyperlipidemia: Secondary | ICD-10-CM

## 2020-04-29 DIAGNOSIS — J4541 Moderate persistent asthma with (acute) exacerbation: Secondary | ICD-10-CM

## 2020-04-29 DIAGNOSIS — Z9981 Dependence on supplemental oxygen: Secondary | ICD-10-CM

## 2020-04-29 DIAGNOSIS — K219 Gastro-esophageal reflux disease without esophagitis: Secondary | ICD-10-CM

## 2020-04-29 DIAGNOSIS — M961 Postlaminectomy syndrome, not elsewhere classified: Secondary | ICD-10-CM

## 2020-04-29 DIAGNOSIS — N401 Enlarged prostate with lower urinary tract symptoms: Secondary | ICD-10-CM

## 2020-04-29 NOTE — Assessment & Plan Note
-   recent prolonged admission with subsequent weakness  - encouraged to follow-up with outpatient PT for further intervention and support

## 2020-04-29 NOTE — Patient Instructions
-   Continue with previously planned follow-up with Dr. [name] in July  - We will send an order for 3% Hypertonic Saline to the [facility] and ask them to send it through Medicare part B for lowest cost.  - Recommend performing nebulized regimen in the following order to maximize deposition in the lungs  1. DuoNeb  2. Hypertonic saline 3%  3. Vest/Aerobika   4. Pulmicort  5. Rosalyn Gess    My nurse is Shearon Stalls, Charity fundraiser.  She can be reached at [phone].    Please contact my nurse with any signs and symptoms of worsening productive cough with thick secretions, blood in sputum, chest tightness/pain, shortness of breath, fever, chills, night sweats, or any questions or concerns.    For refills on medications, please have your pharmacy fax a refill authorization request form to our office at [phone]. Please allow at least 3 business days for refill requests.    For urgent issues after business hours/weekends/holidays call [phone] and request for the pulmonary fellow to be paged.

## 2020-04-29 NOTE — Progress Notes
Due to the COVID 19, this clinic visit was transitioned to a telehealth visit for the safety of the patient, community and health system.    Obtained patient's verbal consent to treat them and their agreement to [facility] financial policy and NPP via this telehealth visit during the [facility] Emergency    Hospital Follow-up Visit    Admission Diagnosis: Dyspnea  Admitted: 2/11 - 04/26/20  Primary Pulm Provider: Dr. Maryjean Morn, [name] 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 ?Patient was hospitalized in mid January for several days secondary to acute bronchitis and exacerbation of asthma. ?He returned to his prior baseline following treatment with antibiotics and steroids. 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 Stay was complicated by respiratory decompensation requiring transfer to ICU and comfort flow. Treated with broad spectrum antibiotics and aggressive diuresis. Resumed PTA duoneb, budesonide and aformoterol twice daily with vest therapy. Producing clear sputum. Remains on baseline 4L oxygen.  Today he presents with his wife for follow-up. Breathing symptoms are back to baseline. Reports he continues to feel weak but getting stronger daily. Increasing distance walking and tolerating without need for Albuterol. Planning to start outpatient PT in the near future.   Felt that hypertonic saline was previously helpful, but was unable to get due to pandemic shortages and OOP cost. Nebulized treatments are currently free of cost and interested in resuming this.   He continues to watch his fluid closely at home and watching fluid intake. Unsure of current CardioMEM readings - plan to follow-up with cardiology team.       Review of Systems Constitutional: Negative for chills, fever and malaise/fatigue.   Respiratory: Positive for cough and sputum production. Negative for shortness of breath and wheezing.    Gastrointestinal: Negative for heartburn.   Psychiatric/Behavioral: The patient does not have insomnia.    All other systems reviewed and are negative.          Current Outpatient Medications:   ?  acetaminophen (TYLENOL) 325 mg tablet, Take two tablets by mouth every 6 hours as needed for Pain., Disp: 30 tablet, Rfl: 0  ?  albuterol 0.083% (PROVENTIL) 2.5 mg /3 mL (0.083 %) nebulizer solution, Inhale 3 mL solution by nebulizer as directed every 6 hours as needed for Wheezing or Shortness of Breath. Indications: asthma, Disp: 270 mL, Rfl: 11  ?  albuterol sulfate (PROAIR HFA) 90 mcg/actuation HFA aerosol inhaler, Inhale two puffs by mouth into the lungs every 4 hours as needed for Wheezing or Shortness of Breath., Disp: 18 g, Rfl: 11  ?  allopurinoL (ZYLOPRIM) 300 mg tablet, Take 300 mg by mouth daily. Take with food., Disp: , Rfl:   ?  AMITR/GABAPEN/EMU OIL 05/24/08% CREAM (COMPOUND), Apply topically to affected area three times daily as needed., Disp: 100 g, Rfl: 3  ?  arformoteroL (BROVANA) 15 mcg/2 mL nebulizer solution, Inhale 2 mL solution by nebulizer as directed twice daily. Dx: J44.9, Disp: 120 mL, Rfl: 11  ?  ASCORBIC ACID-ASCORBATE SODIUM PO, Take 500 mg by mouth every morning., Disp: , Rfl:   ?  aspirin EC 81 mg tablet, Take one tablet by mouth daily. Take with food., Disp: 90 tablet, Rfl: 3  ?  atorvastatin (LIPITOR) 40 mg tablet, Take one tablet by mouth daily., Disp: 90  tablet, Rfl: 1  ?  benzonatate (TESSALON PERLES) 100 mg capsule, Take one capsule by mouth every 8 hours., Disp: 21 capsule, Rfl: 0  ?  budesonide (PULMICORT) 0.25 mg/2 mL nebulizer solution, Inhale 2 mL solution by nebulizer as directed twice daily. Dx: J44.9, Disp: 120 mL, Rfl: 11  ?  bumetanide (BUMEX) 1 mg tablet, Take three tablets by mouth twice daily., Disp: 180 tablet, Rfl: 3  ?  cephalexin (KEFLEX) 500 mg capsule, Take two capsules by mouth every 12 hours for 5 days., Disp: 20 capsule, Rfl: 0  ?  cholecalciferol (VITAMIN D-3) 1,000 units tablet, Take 1,000 Units by mouth daily., Disp: , Rfl:   ?  duloxetine DR (CYMBALTA) 60 mg capsule, Take one capsule by mouth daily., Disp: 90 capsule, Rfl: 1  ?  empagliflozin (JARDIANCE) 25 mg tablet, Take one tablet by mouth daily., Disp: 90 tablet, Rfl: 3  ?  finasteride (PROSCAR) 5 mg tablet, Take one tablet by mouth daily., Disp: 90 tablet, Rfl: 3  ?  fish oil- omega 3-DHA/EPA 300/1,000 mg capsule, Take 1 capsule by mouth daily., Disp: , Rfl:   ?  gabapentin (NEURONTIN) 100 mg capsule, Take four capsules by mouth twice daily., Disp: 90 capsule, Rfl: 1  ?  insulin aspart (U-100) (NOVOLOG FLEXPEN U-100 INSULIN) 100 unit/mL (3 mL) PEN, Inject seven Units under the skin three times daily with meals., Disp: 45 mL, Rfl: 1  ?  insulin glargine (LANTUS SOLOSTAR U-100 INSULIN) 100 unit/mL (3 mL) subcutaneous PEN, Inject fifteen Units under the skin at bedtime daily., Disp: 45 mL, Rfl: 3  ?  insulin pen needles (disposable) (BD ULTRA-FINE MINI PEN NEEDLE) 31 gauge x 3/16 pen needle, Use with insulin pens, Disp: 300 each, Rfl: 3  ?  ipratropium bromide (ATROVENT) 21 mcg (0.03 %) nasal spray, Apply two sprays to each nostril as directed every 12 hours., Disp: 30 mL, Rfl: 3  ?  metOLazone (ZAROXOLYN) 2.5 mg tablet, Take one tablet by mouth daily as needed. PER OUTPATIENT CARDIOLOGY PROVIDER AS NEEDED FOR FLUID RETENTION, Disp: 90 tablet, Rfl: 0  ?  Miscellaneous Medical Supply misc, Rx: Please wrap legs with ACE bandage from toes as high up to the leg as possible bilaterally Dx: Lymphedema, Disp: 2 each, Rfl: 0  ?  montelukast (SINGULAIR) 10 mg tablet, Take one tablet by mouth at bedtime daily., Disp: 90 tablet, Rfl: 1  ?  nitroglycerin (NITROSTAT) 0.4 mg tablet, Place 0.4 mg under tongue every 5 minutes as needed for Chest Pain. Max of 3 tablets, call 911., Disp: , Rfl:   ?  nystatin (MYCOSTATIN) 100,000 units/mL oral suspension, Swish and Swallow 5 mL by mouth as directed four times daily., Disp: 900 mL, Rfl: 1  ?  oxyCODONE (ROXICODONE) 5 mg tablet, Take one tablet to two tablets by mouth every 8 hours as needed for Pain, Disp: 42 tablet, Rfl: 0  ?  pantoprazole DR (PROTONIX) 40 mg tablet, Take one tablet by mouth twice daily., Disp: 180 tablet, Rfl: 3  ?  polyethylene glycol 3350 (MIRALAX) 17 g packet, Take two packets by mouth twice daily., Disp: 12 each, Rfl: 3  ?  predniSONE (DELTASONE) 10 mg tablet, Take 10 mg by mouth as Needed. Takes only when instructed by PCP for COPD exacerbations, Disp: , Rfl:   ?  rivaroxaban (XARELTO) 20 mg tablet, Take one tablet by mouth daily with dinner. Take with food., Disp: 90 tablet, Rfl: 1  ?  senna/docusate (SENOKOT-S) 8.6/50 mg tablet, Take  one tablet by mouth twice daily., Disp: 90 tablet, Rfl: 3  ?  spironolactone (ALDACTONE) 25 mg tablet, Take one tablet by mouth twice daily. Take with food., Disp: 180 tablet, Rfl: 3  ?  tamsulosin (FLOMAX) 0.4 mg capsule, TAKE (1) CAPSULE BY MOUTH ONCE DAILY., Disp: 90 capsule, Rfl: 1  ?  zinc sulfate 220 mg (50 mg elemental zinc) capsule, Take 220 mg by mouth daily., Disp: , Rfl:      There were no vitals filed for this visit.    Assessment/Plan:    Problem   Complex Care Coordination   Moderate Persistent Asthma With Acute Exacerbation    He has had asthma since sometime in his 20-30s. He gets cough, chest tightness, wheezing, shortness of breath that is year round with worsening in the Spring and Fall.   Triggers: URI, hot/cold air. Additionally he has been diagnosed with bronchiectasis and emphysema; his PFTs suggest both obstructive and restrictive disease and asthma is unlikely to be the sole cause of his dyspnea.    He has negative IgE immunocaps to aeroallergens which makes the possibility of allergic asthma highly unlikely. His IgE level was 17 and he had negative ANCAs, MPO and PR3.     Repeatedly being treated with steroids now for flares.  Also on hypertonic saline 3 times a day although not currently using them), DuoNebs, Symbicort, and Singulair as well as aggressive pulmonary clearance with vest and Aerobika.        Bronchiectasis Without Complication (Hcc)    Non-sputum producer.  Currently on Symbicort.  - Dr. [name] was able to get him a vest to wear at home for secretions and this has helped immensely with his secretions and breathing.      Osa On Cpap    CPAP at  DME: [facility]    Split night:  08/14/2017  AHI 43.2  REM n/a  Time below 88 %: 98 mins  Lowest sat: 83%     CPAP 10 cm h20 effective          OSA on CPAP  - continue nightly CPAP compliance    Moderate persistent asthma with acute exacerbation  - continue Singluair with Pulmicort and Brovana twice daily     Bronchiectasis without complication (HCC)  - add HTS 3% twice daily; will attempt to run through medicare part B  - recommend using DuoNeb prior to HTS 3% to reduce potential from bronchospasm  - continue Vest twice daily with PEP     Complex care coordination  - recent prolonged admission with subsequent weakness  - encouraged to follow-up with outpatient PT for further intervention and support      Keep currently scheduled follow-up in 4 mo with Dr. [name].  Anselm Lis, APRN-NP        Future Appointments   Date Time Provider Department Center   05/13/2020 12:00 AM [facility] CVM Procedur   05/14/2020 11:20 AM Hyman Bower A, DO [facility]ALL[facility]G IM   05/18/2020  1:00 PM Comfort, [name], MD [facility]PG[facility] IM   05/18/2020  2:00 PM Pearson, Shela Commons, APRN-NP MACHFC CVM Exam   05/31/2020 10:30 AM Donnelly Stager, MD MACLIBCL CVM Exam   06/01/2020 11:15 AM Raelyn Mora, PA-C [facility] Urology   06/14/2020 12:00 AM [facility] CVM Procedur   08/27/2020 10:40 AM Deveron Furlong, MD QVAPULM IM

## 2020-04-29 NOTE — Progress Notes
Did patient read financial policy, consent to treat, and notice of privacy practices? Yes    Does the patient give verbal consent to each policy? Yes    Does the patient have any vitals to report? Yes    Weight Vitals charted in O2? Yes    Is the patient in pain? 0 = No pain    Screening questions completed? Yes    Is the patient able to acces the Mychart message with the start visit link? Yes    Is patient in "virtual waiting room" No

## 2020-04-29 NOTE — Assessment & Plan Note
-   continue Singluair with Pulmicort and Brovana twice daily

## 2020-04-29 NOTE — Assessment & Plan Note
-   continue nightly CPAP compliance

## 2020-04-29 NOTE — Assessment & Plan Note
-   add HTS 3% twice daily; will attempt to run through medicare part B  - recommend using DuoNeb prior to HTS 3% to reduce potential from bronchospasm  - continue Vest twice daily with PEP

## 2020-04-30 ENCOUNTER — Encounter: Admit: 2020-04-30 | Discharge: 2020-04-30 | Payer: MEDICARE

## 2020-04-30 MED ORDER — SODIUM CHLORIDE 3 % IN NEBU
4 mL | Freq: Two times a day (BID) | RESPIRATORY_TRACT | 11 refills | 30.00000 days | Status: AC
Start: 2020-04-30 — End: ?
  Filled 2020-05-02: qty 240, 30d supply, fill #1

## 2020-04-30 NOTE — Telephone Encounter
Rx sent to [facility].  Will follow-up with the pharmacy regarding cost.  Shearon Stalls, RN

## 2020-04-30 NOTE — Telephone Encounter
-----   Message from Anselm Lis, APRN-NP sent at 04/29/2020  2:57 PM CST -----  Please send order for HTS 3% BID to [facility] with request to run through Part B. Pt reports he is now receiving nebulized medication free of cost (though he is not sure why/how long). Please update patient with any information we can get regarding any assistance he is currently receiving and if it could apply to HTS.Thanks!

## 2020-05-03 ENCOUNTER — Encounter: Admit: 2020-05-03 | Discharge: 2020-05-03 | Payer: MEDICARE

## 2020-05-04 ENCOUNTER — Encounter: Admit: 2020-05-04 | Discharge: 2020-05-04 | Payer: MEDICARE

## 2020-05-06 ENCOUNTER — Encounter: Admit: 2020-05-06 | Discharge: 2020-05-06 | Payer: MEDICARE

## 2020-05-06 NOTE — Telephone Encounter
PC to pt to see if trouble sending PA readings. Spoke to pt's wife. Pt is currently inpatient at [facility] for severe nose bleed. Wife to update [place] with status when he goes home.

## 2020-05-07 ENCOUNTER — Encounter: Admit: 2020-05-07 | Discharge: 2020-05-07 | Payer: MEDICARE

## 2020-05-07 NOTE — Progress Notes
Patient's wife [name] reached by phone for routine CM follow up.     Chart reviewed. Noted patient had been admitted to [facility] for nosebleed.   Wife reports this started on Sunday.    He was initially treated in [place] and transferred to [place].   He had a prolonged stay in ED waiting for bed.   He was discharged yesterday.   He did have some lung issues while hospitalized- initially started on oral antibiotics and then received IV antibiotics while inpatient.   Was not discharged with antibiotics for home.     No further bleeding currently.   ENT in [place] discussed plan if patient has another nose bleed.     Wife wanted to let PCP and Dr Sherryll Burger in cardiology know that patient's Xarelto was held Sunday x4 days. He resumed it last night.     He does have some swelling to the LLE, which is not unusual for the patient.   Unable to use ted hose on that leg due to some healing wounds.   Encouraged her to monitor leg edema and consider using ace wrap to promote venous return.     Will route to PCP and cardiology as Lorain Childes

## 2020-05-10 ENCOUNTER — Encounter: Admit: 2020-05-10 | Discharge: 2020-05-10 | Payer: MEDICARE

## 2020-05-13 ENCOUNTER — Encounter: Admit: 2020-05-13 | Discharge: 2020-05-13 | Payer: MEDICARE

## 2020-05-13 DIAGNOSIS — W19XXXS Unspecified fall, sequela: Secondary | ICD-10-CM

## 2020-05-13 NOTE — Telephone Encounter
The referral has been placed to PT.  Please fax this to [facility].  Thank you.

## 2020-05-13 NOTE — Telephone Encounter
Notified patient of referral placed. Faxed to [facility] at [phone]

## 2020-05-13 NOTE — Telephone Encounter
Received call from wife asking if Dr. Crecencio Mc can put in a referral for Physical Therapy. Patient would like referral to go to [facility] in [place], [place].    Routing to Dr. Crecencio Mc to advise.

## 2020-05-14 ENCOUNTER — Encounter: Admit: 2020-05-14 | Discharge: 2020-05-14 | Payer: MEDICARE

## 2020-05-14 ENCOUNTER — Ambulatory Visit: Admit: 2020-05-14 | Discharge: 2020-05-15 | Payer: MEDICARE

## 2020-05-14 DIAGNOSIS — N401 Enlarged prostate with lower urinary tract symptoms: Secondary | ICD-10-CM

## 2020-05-14 DIAGNOSIS — R053 Chronic cough: Secondary | ICD-10-CM

## 2020-05-14 DIAGNOSIS — G4733 Obstructive sleep apnea (adult) (pediatric): Secondary | ICD-10-CM

## 2020-05-14 DIAGNOSIS — R609 Edema, unspecified: Secondary | ICD-10-CM

## 2020-05-14 DIAGNOSIS — M961 Postlaminectomy syndrome, not elsewhere classified: Secondary | ICD-10-CM

## 2020-05-14 DIAGNOSIS — M954 Acquired deformity of chest and rib: Secondary | ICD-10-CM

## 2020-05-14 DIAGNOSIS — R0600 Dyspnea, unspecified: Secondary | ICD-10-CM

## 2020-05-14 DIAGNOSIS — Z9981 Dependence on supplemental oxygen: Secondary | ICD-10-CM

## 2020-05-14 DIAGNOSIS — J4541 Moderate persistent asthma with (acute) exacerbation: Secondary | ICD-10-CM

## 2020-05-14 DIAGNOSIS — J45909 Unspecified asthma, uncomplicated: Secondary | ICD-10-CM

## 2020-05-14 DIAGNOSIS — R040 Epistaxis: Secondary | ICD-10-CM

## 2020-05-14 DIAGNOSIS — J479 Bronchiectasis, uncomplicated: Secondary | ICD-10-CM

## 2020-05-14 DIAGNOSIS — I429 Cardiomyopathy, unspecified: Secondary | ICD-10-CM

## 2020-05-14 DIAGNOSIS — J439 Emphysema, unspecified: Secondary | ICD-10-CM

## 2020-05-14 DIAGNOSIS — E782 Mixed hyperlipidemia: Secondary | ICD-10-CM

## 2020-05-14 DIAGNOSIS — I2510 Atherosclerotic heart disease of native coronary artery without angina pectoris: Secondary | ICD-10-CM

## 2020-05-14 DIAGNOSIS — J449 Chronic obstructive pulmonary disease, unspecified: Secondary | ICD-10-CM

## 2020-05-14 DIAGNOSIS — K219 Gastro-esophageal reflux disease without esophagitis: Secondary | ICD-10-CM

## 2020-05-14 DIAGNOSIS — I5042 Chronic combined systolic (congestive) and diastolic (congestive) heart failure: Secondary | ICD-10-CM

## 2020-05-14 DIAGNOSIS — L03116 Cellulitis of left lower limb: Secondary | ICD-10-CM

## 2020-05-14 DIAGNOSIS — J189 Pneumonia, unspecified organism: Secondary | ICD-10-CM

## 2020-05-14 DIAGNOSIS — N183 CKD (chronic kidney disease) stage 3, GFR 30-59 ml/min (HCC): Secondary | ICD-10-CM

## 2020-05-14 DIAGNOSIS — I509 Heart failure, unspecified: Secondary | ICD-10-CM

## 2020-05-14 DIAGNOSIS — J302 Other seasonal allergic rhinitis: Secondary | ICD-10-CM

## 2020-05-14 DIAGNOSIS — B999 Unspecified infectious disease: Secondary | ICD-10-CM

## 2020-05-14 DIAGNOSIS — I714 Abdominal aortic aneurysm, without rupture: Secondary | ICD-10-CM

## 2020-05-14 DIAGNOSIS — U071 Pneumonia due to COVID-19 virus: Secondary | ICD-10-CM

## 2020-05-14 DIAGNOSIS — Z8679 Personal history of other diseases of the circulatory system: Secondary | ICD-10-CM

## 2020-05-14 DIAGNOSIS — Z8614 Personal history of Methicillin resistant Staphylococcus aureus infection: Secondary | ICD-10-CM

## 2020-05-14 DIAGNOSIS — J9612 Chronic respiratory failure with hypercapnia: Secondary | ICD-10-CM

## 2020-05-14 DIAGNOSIS — I10 Essential (primary) hypertension: Secondary | ICD-10-CM

## 2020-05-14 DIAGNOSIS — Z95828 Presence of other vascular implants and grafts: Secondary | ICD-10-CM

## 2020-05-14 MED ORDER — DOXYCYCLINE HYCLATE 100 MG PO TAB
100 mg | ORAL_TABLET | Freq: Every day | ORAL | 3 refills | 8.00000 days | Status: AC
Start: 2020-05-14 — End: ?

## 2020-05-15 ENCOUNTER — Encounter: Admit: 2020-05-15 | Discharge: 2020-05-15 | Payer: MEDICARE

## 2020-05-15 DIAGNOSIS — J449 Chronic obstructive pulmonary disease, unspecified: Secondary | ICD-10-CM

## 2020-05-15 DIAGNOSIS — M961 Postlaminectomy syndrome, not elsewhere classified: Secondary | ICD-10-CM

## 2020-05-15 DIAGNOSIS — J479 Bronchiectasis, uncomplicated: Secondary | ICD-10-CM

## 2020-05-15 DIAGNOSIS — D801 Nonfamilial hypogammaglobulinemia: Secondary | ICD-10-CM

## 2020-05-15 DIAGNOSIS — M954 Acquired deformity of chest and rib: Secondary | ICD-10-CM

## 2020-05-15 DIAGNOSIS — R0600 Dyspnea, unspecified: Secondary | ICD-10-CM

## 2020-05-15 DIAGNOSIS — I2510 Atherosclerotic heart disease of native coronary artery without angina pectoris: Secondary | ICD-10-CM

## 2020-05-15 DIAGNOSIS — R609 Edema, unspecified: Secondary | ICD-10-CM

## 2020-05-15 DIAGNOSIS — N401 Enlarged prostate with lower urinary tract symptoms: Secondary | ICD-10-CM

## 2020-05-15 DIAGNOSIS — Z9981 Dependence on supplemental oxygen: Secondary | ICD-10-CM

## 2020-05-15 DIAGNOSIS — R053 Chronic cough: Secondary | ICD-10-CM

## 2020-05-15 DIAGNOSIS — K219 Gastro-esophageal reflux disease without esophagitis: Secondary | ICD-10-CM

## 2020-05-15 DIAGNOSIS — E782 Mixed hyperlipidemia: Secondary | ICD-10-CM

## 2020-05-15 DIAGNOSIS — N183 CKD (chronic kidney disease) stage 3, GFR 30-59 ml/min (HCC): Secondary | ICD-10-CM

## 2020-05-15 DIAGNOSIS — I429 Cardiomyopathy, unspecified: Secondary | ICD-10-CM

## 2020-05-15 DIAGNOSIS — I5042 Chronic combined systolic (congestive) and diastolic (congestive) heart failure: Secondary | ICD-10-CM

## 2020-05-15 DIAGNOSIS — I509 Heart failure, unspecified: Secondary | ICD-10-CM

## 2020-05-15 DIAGNOSIS — Z8614 Personal history of Methicillin resistant Staphylococcus aureus infection: Secondary | ICD-10-CM

## 2020-05-15 DIAGNOSIS — U071 Pneumonia due to COVID-19 virus: Secondary | ICD-10-CM

## 2020-05-15 DIAGNOSIS — L03116 Cellulitis of left lower limb: Secondary | ICD-10-CM

## 2020-05-15 DIAGNOSIS — I714 Abdominal aortic aneurysm, without rupture: Secondary | ICD-10-CM

## 2020-05-15 DIAGNOSIS — J45909 Unspecified asthma, uncomplicated: Secondary | ICD-10-CM

## 2020-05-15 DIAGNOSIS — J302 Other seasonal allergic rhinitis: Secondary | ICD-10-CM

## 2020-05-15 DIAGNOSIS — J189 Pneumonia, unspecified organism: Secondary | ICD-10-CM

## 2020-05-15 DIAGNOSIS — G4733 Obstructive sleep apnea (adult) (pediatric): Secondary | ICD-10-CM

## 2020-05-15 DIAGNOSIS — I10 Essential (primary) hypertension: Secondary | ICD-10-CM

## 2020-05-15 DIAGNOSIS — Z8679 Personal history of other diseases of the circulatory system: Secondary | ICD-10-CM

## 2020-05-15 DIAGNOSIS — Z95828 Presence of other vascular implants and grafts: Secondary | ICD-10-CM

## 2020-05-18 ENCOUNTER — Ambulatory Visit: Admit: 2020-05-18 | Discharge: 2020-05-18 | Payer: MEDICARE

## 2020-05-18 ENCOUNTER — Encounter: Admit: 2020-05-18 | Discharge: 2020-05-18 | Payer: MEDICARE

## 2020-05-18 DIAGNOSIS — I480 Paroxysmal atrial fibrillation: Secondary | ICD-10-CM

## 2020-05-18 DIAGNOSIS — N401 Enlarged prostate with lower urinary tract symptoms: Secondary | ICD-10-CM

## 2020-05-18 DIAGNOSIS — I5042 Chronic combined systolic (congestive) and diastolic (congestive) heart failure: Secondary | ICD-10-CM

## 2020-05-18 DIAGNOSIS — R040 Epistaxis: Secondary | ICD-10-CM

## 2020-05-18 DIAGNOSIS — I509 Heart failure, unspecified: Secondary | ICD-10-CM

## 2020-05-18 DIAGNOSIS — U071 Pneumonia due to COVID-19 virus: Secondary | ICD-10-CM

## 2020-05-18 DIAGNOSIS — J9612 Chronic respiratory failure with hypercapnia: Secondary | ICD-10-CM

## 2020-05-18 DIAGNOSIS — N183 Stage 3 chronic kidney disease, unspecified whether stage 3a or 3b CKD (HCC): Secondary | ICD-10-CM

## 2020-05-18 DIAGNOSIS — J302 Other seasonal allergic rhinitis: Secondary | ICD-10-CM

## 2020-05-18 DIAGNOSIS — D62 Acute posthemorrhagic anemia: Secondary | ICD-10-CM

## 2020-05-18 DIAGNOSIS — J479 Bronchiectasis, uncomplicated: Secondary | ICD-10-CM

## 2020-05-18 DIAGNOSIS — M961 Postlaminectomy syndrome, not elsewhere classified: Secondary | ICD-10-CM

## 2020-05-18 DIAGNOSIS — I10 Essential (primary) hypertension: Secondary | ICD-10-CM

## 2020-05-18 DIAGNOSIS — G4733 Obstructive sleep apnea (adult) (pediatric): Secondary | ICD-10-CM

## 2020-05-18 DIAGNOSIS — I2510 Atherosclerotic heart disease of native coronary artery without angina pectoris: Secondary | ICD-10-CM

## 2020-05-18 DIAGNOSIS — E1165 Type 2 diabetes mellitus with hyperglycemia: Secondary | ICD-10-CM

## 2020-05-18 DIAGNOSIS — Z9981 Dependence on supplemental oxygen: Secondary | ICD-10-CM

## 2020-05-18 DIAGNOSIS — K219 Gastro-esophageal reflux disease without esophagitis: Secondary | ICD-10-CM

## 2020-05-18 DIAGNOSIS — I255 Ischemic cardiomyopathy: Secondary | ICD-10-CM

## 2020-05-18 DIAGNOSIS — M954 Acquired deformity of chest and rib: Secondary | ICD-10-CM

## 2020-05-18 DIAGNOSIS — Z8614 Personal history of Methicillin resistant Staphylococcus aureus infection: Secondary | ICD-10-CM

## 2020-05-18 DIAGNOSIS — Z8679 Personal history of other diseases of the circulatory system: Secondary | ICD-10-CM

## 2020-05-18 DIAGNOSIS — I5043 Acute on chronic combined systolic (congestive) and diastolic (congestive) heart failure: Secondary | ICD-10-CM

## 2020-05-18 DIAGNOSIS — R609 Edema, unspecified: Secondary | ICD-10-CM

## 2020-05-18 DIAGNOSIS — R0600 Dyspnea, unspecified: Secondary | ICD-10-CM

## 2020-05-18 DIAGNOSIS — J209 Acute bronchitis, unspecified: Secondary | ICD-10-CM

## 2020-05-18 DIAGNOSIS — I714 Abdominal aortic aneurysm, without rupture: Secondary | ICD-10-CM

## 2020-05-18 DIAGNOSIS — D801 Nonfamilial hypogammaglobulinemia: Secondary | ICD-10-CM

## 2020-05-18 DIAGNOSIS — L03116 Cellulitis of left lower limb: Secondary | ICD-10-CM

## 2020-05-18 DIAGNOSIS — J189 Pneumonia, unspecified organism: Secondary | ICD-10-CM

## 2020-05-18 DIAGNOSIS — I429 Cardiomyopathy, unspecified: Secondary | ICD-10-CM

## 2020-05-18 DIAGNOSIS — J470 Bronchiectasis with acute lower respiratory infection: Secondary | ICD-10-CM

## 2020-05-18 DIAGNOSIS — Z95828 Presence of other vascular implants and grafts: Secondary | ICD-10-CM

## 2020-05-18 DIAGNOSIS — F331 Major depressive disorder, recurrent, moderate: Secondary | ICD-10-CM

## 2020-05-18 DIAGNOSIS — J449 Chronic obstructive pulmonary disease, unspecified: Secondary | ICD-10-CM

## 2020-05-18 DIAGNOSIS — R053 Chronic cough: Secondary | ICD-10-CM

## 2020-05-18 DIAGNOSIS — J45909 Unspecified asthma, uncomplicated: Secondary | ICD-10-CM

## 2020-05-18 DIAGNOSIS — E782 Mixed hyperlipidemia: Secondary | ICD-10-CM

## 2020-05-18 DIAGNOSIS — Z23 Encounter for immunization: Secondary | ICD-10-CM

## 2020-05-18 DIAGNOSIS — Z95810 Presence of automatic (implantable) cardiac defibrillator: Secondary | ICD-10-CM

## 2020-05-18 LAB — CBC AND DIFF
Lab: 0 K/UL (ref 0–0.20)
Lab: 0.2 K/UL (ref 0–0.45)
Lab: 0.8 K/UL — ABNORMAL HIGH (ref 0–0.80)
Lab: 1 % (ref 0–2)
Lab: 1 K/UL (ref 1.0–4.8)
Lab: 102 FL — ABNORMAL HIGH (ref 80–100)
Lab: 11 % (ref 4–12)
Lab: 13 % — ABNORMAL LOW (ref 24–44)
Lab: 17 % — ABNORMAL HIGH (ref >60)
Lab: 3 % (ref 0–5)
Lab: 34 g/dL (ref 32.0–36.0)
Lab: 35 pg — ABNORMAL HIGH (ref 26–34)
Lab: 400 K/UL (ref 150–400)
Lab: 5.7 K/UL (ref 1.8–7.0)
Lab: 7.4 FL (ref 7–11)
Lab: 72 % (ref 41–77)
Lab: 8 K/UL — ABNORMAL LOW (ref 4.5–11.0)

## 2020-05-18 LAB — MICROALB/CR RATIO-URINE RANDOM
Lab: 68 mg/dL
Lab: 7.2 ug/mL (ref <19)

## 2020-05-18 LAB — BASIC METABOLIC PANEL
Lab: 135 MMOL/L — ABNORMAL LOW (ref 137–147)
Lab: 15 g/dL — ABNORMAL HIGH (ref 3–12)
Lab: 3.5 MMOL/L (ref 3.5–5.1)
Lab: 33 MMOL/L — ABNORMAL HIGH (ref 21–30)

## 2020-05-18 LAB — IMMUNOGLOBULIN G (IGG): Lab: 684 mg/dL — ABNORMAL LOW (ref 762–1488)

## 2020-05-18 MED ORDER — FLASH GLUCOSE SCANNING READER MISC MISC
ORAL | 0 refills | 30.00000 days | Status: AC
Start: 2020-05-18 — End: ?
  Filled 2020-07-30: qty 240, 30d supply, fill #1

## 2020-05-18 MED ORDER — FREESTYLE LIBRE 14 DAY SENSOR MISC KIT
ORAL | 0 refills | 30.00000 days | Status: AC
Start: 2020-05-18 — End: ?

## 2020-05-18 NOTE — Progress Notes
History of Present Illness  Ruben Wood. is a 80 y.o. male     He was recently admitted to [facility] for epistaxis that could not be controlled with conservative measures.  He was seen by ENT who packed it.  He did lose additional blood as his hemoglobin trended down to 9.7 on discharge.  Since being home, he has not had any recurrence of bleeding from his nose.    He had some questions about his oxygen.  When he was recently hospitalized here at [place], he had a really difficult time from a respiratory standpoint.  He had difficulty with getting his oxygen levels adequately titrated.  He ended up being discharged on 4 L of oxygen.  When he came out of [facility], they wanted him to be on 6 L and so he now has a different machine at home that he does not like as much.  He is sitting here in clinic today on 4 L and satting in the mid 90s and having a much more comfortable respiratory status than he typically does.    He does have COPD with bronchiectasis with frequent flares.  He recently saw Dr. Barbette Or who restarted him on doxycycline.  He is having continued cough which is at his baseline.  He has not had any change in sputum production or wheezing.    Heart failure standpoint, he appears to be doing better overall.  He has been taking the Bumex.  He has not needed any metolazone.  He has had several episodes when he stands up and will feel a little bit lightheaded like he is going to pass out.  He has not had any fall and syncope fortunately.    He does have depression.  He has had multiple recent hospitalizations.  He has been on duloxetine.       Review of Systems  Per HPI    Objective:         ? acetaminophen (TYLENOL) 325 mg tablet Take two tablets by mouth every 6 hours as needed for Pain.   ? albuterol 0.083% (PROVENTIL) 2.5 mg /3 mL (0.083 %) nebulizer solution Inhale 3 mL solution by nebulizer as directed every 6 hours as needed for Wheezing or Shortness of Breath. Indications: asthma   ? albuterol sulfate (PROAIR HFA) 90 mcg/actuation HFA aerosol inhaler Inhale two puffs by mouth into the lungs every 4 hours as needed for Wheezing or Shortness of Breath.   ? allopurinoL (ZYLOPRIM) 300 mg tablet Take 300 mg by mouth daily. Take with food.   ? AMITR/GABAPEN/EMU OIL 05/24/08% CREAM (COMPOUND) Apply topically to affected area three times daily as needed.   ? arformoteroL (BROVANA) 15 mcg/2 mL nebulizer solution Inhale 2 mL solution by nebulizer as directed twice daily. Dx: J44.9   ? ASCORBIC ACID-ASCORBATE SODIUM PO Take 500 mg by mouth every morning.   ? aspirin EC 81 mg tablet Take one tablet by mouth daily. Take with food.   ? atorvastatin (LIPITOR) 40 mg tablet Take one tablet by mouth daily.   ? benzonatate (TESSALON PERLES) 100 mg capsule Take one capsule by mouth every 8 hours.   ? budesonide (PULMICORT) 0.25 mg/2 mL nebulizer solution Inhale 2 mL solution by nebulizer as directed twice daily. Dx: J44.9   ? bumetanide (BUMEX) 1 mg tablet Take three tablets by mouth twice daily.   ? cholecalciferol (VITAMIN D-3) 1,000 units tablet Take 1,000 Units by mouth daily.   ? doxycycline hyclate (VIBRACIN)  100 mg tablet Take one tablet by mouth daily for 30 days.   ? duloxetine DR (CYMBALTA) 60 mg capsule Take one capsule by mouth daily.   ? empagliflozin (JARDIANCE) 25 mg tablet Take one tablet by mouth daily.   ? finasteride (PROSCAR) 5 mg tablet Take one tablet by mouth daily.   ? fish oil- omega 3-DHA/EPA 300/1,000 mg capsule Take 1 capsule by mouth daily.   ? flash glucose scanning reader (FREESTYLE LIBRE) reader Use as directed.   ? flash glucose sensor (FREESTYLE LIBRE 14 DAY SENSOR) sensor Type 2 diabetes  Indications: type 2 diabetes mellitus   ? gabapentin (NEURONTIN) 100 mg capsule Take four capsules by mouth twice daily.   ? insulin aspart (U-100) (NOVOLOG FLEXPEN U-100 INSULIN) 100 unit/mL (3 mL) PEN Inject seven Units under the skin three times daily with meals.   ? insulin glargine (LANTUS SOLOSTAR U-100 INSULIN) 100 unit/mL (3 mL) subcutaneous PEN Inject fifteen Units under the skin at bedtime daily.   ? insulin pen needles (disposable) (BD ULTRA-FINE MINI PEN NEEDLE) 31 gauge x 3/16 pen needle Use with insulin pens   ? ipratropium bromide (ATROVENT) 21 mcg (0.03 %) nasal spray Apply two sprays to each nostril as directed every 12 hours.   ? metOLazone (ZAROXOLYN) 2.5 mg tablet Take one tablet by mouth daily as needed. PER OUTPATIENT CARDIOLOGY PROVIDER AS NEEDED FOR FLUID RETENTION   ? Miscellaneous Medical Supply misc Rx: Please wrap legs with ACE bandage from toes as high up to the leg as possible bilaterally  Dx: Lymphedema   ? montelukast (SINGULAIR) 10 mg tablet Take one tablet by mouth at bedtime daily.   ? nitroglycerin (NITROSTAT) 0.4 mg tablet Place 0.4 mg under tongue every 5 minutes as needed for Chest Pain. Max of 3 tablets, call 911.   ? nystatin (MYCOSTATIN) 100,000 units/mL oral suspension Swish and Swallow 5 mL by mouth as directed four times daily.   ? oxyCODONE (ROXICODONE) 5 mg tablet Take one tablet to two tablets by mouth every 8 hours as needed for Pain   ? pantoprazole DR (PROTONIX) 40 mg tablet Take one tablet by mouth twice daily.   ? polyethylene glycol 3350 (MIRALAX) 17 g packet Take two packets by mouth twice daily.   ? predniSONE (DELTASONE) 10 mg tablet Take 10 mg by mouth as Needed. Takes only when instructed by PCP for COPD exacerbations   ? rivaroxaban (XARELTO) 20 mg tablet Take one tablet by mouth daily with dinner. Take with food.   ? senna/docusate (SENOKOT-S) 8.6/50 mg tablet Take one tablet by mouth twice daily.   ? sodium chloride (NEBUSAL) 3 % nebulizer solution Inhale 4 mL by mouth into the lungs twice daily. Dx:  J47.9   ? spironolactone (ALDACTONE) 25 mg tablet Take one tablet by mouth twice daily. Take with food.   ? tamsulosin (FLOMAX) 0.4 mg capsule TAKE (1) CAPSULE BY MOUTH ONCE DAILY.   ? zinc sulfate 220 mg (50 mg elemental zinc) capsule Take 220 mg by mouth daily.     Vitals:    05/18/20 1250   BP: 111/69   BP Source: Arm, Right Upper   Patient Position: Sitting   Pulse: 96   Resp: 18   Temp: 36.7 ?C (98.1 ?F)   TempSrc: Oral   SpO2: 95%   Weight: 131.8 kg (290 lb 9.6 oz)   Height: 180.3 cm (5' 11)   PainSc: Six       Body mass index is 40.53  kg/m?.     Physical Exam  Vitals and nursing note reviewed.   Constitutional:       General: He is not in acute distress.     Appearance: He is well-developed. He is not diaphoretic.   HENT:      Head: Normocephalic and atraumatic.   Neck:      Thyroid: No thyromegaly.      Vascular: No JVD.   Cardiovascular:      Rate and Rhythm: Normal rate and regular rhythm.      Heart sounds: Normal heart sounds. No murmur heard.    No friction rub. No gallop.   Pulmonary:      Effort: Pulmonary effort is normal. No respiratory distress.      Breath sounds: Rhonchi present. No wheezing or rales.   Musculoskeletal:      Right lower leg: Edema present.      Left lower leg: Edema present.      Comments: Trace to 1+ pitting edema in the lower extremities bilaterally   Skin:     Comments: Hyperpigmentation with lipodermatosclerosis   Neurological:      Mental Status: He is alert.         Labwork reviewed:  Lab Results   Component Value Date/Time    HGBA1C 6.7 (H) 04/04/2020 05:40 AM    HGBA1C 5.9 11/25/2019 02:45 PM    HGBA1C 5.9 09/02/2019 09:50 AM    HGBPOC 14.3 04/08/2020 06:58 AM    HGBPOC 11.2 (L) 10/25/2012 10:58 AM    HGBPOC 12.9 (L) 10/25/2012 08:55 AM    MCALBR 7.6 04/29/2019 12:00 AM    TSH 2.49 03/05/2020 08:40 PM    FREET4R 0.99 12/15/2014 09:12 AM    CHOL 161 04/04/2020 05:40 AM    TRIG 152 (H) 04/04/2020 05:40 AM    HDL 53 04/04/2020 05:40 AM    LDL 87 04/04/2020 05:40 AM    NA 137 04/26/2020 05:33 AM    K 3.9 04/26/2020 05:33 AM    CL 92 (L) 04/26/2020 05:33 AM    CO2 36 (H) 04/26/2020 05:33 AM    GAP 9 04/26/2020 05:33 AM    BUN 58 (H) 04/26/2020 05:33 AM    CR 1.68 (H) 04/26/2020 05:33 AM    GLU 120 (H) 04/26/2020 05:33 AM    CA 8.9 04/26/2020 05:33 AM    PO4 4.1 03/08/2020 03:51 AM    ALBUMIN 4.0 04/17/2020 05:14 AM    TOTPROT 6.4 04/17/2020 05:14 AM    ALKPHOS 84 04/17/2020 05:14 AM    AST 13 04/17/2020 05:14 AM    ALT 18 04/17/2020 05:14 AM    TOTBILI 0.9 04/17/2020 05:14 AM    GFR 44 01/30/2020 12:00 AM    GFRAA >60 12/03/2019 04:51 AM    PSA 2.96 11/25/2018 12:00 AM              Assessment and Plan:    1. Epistaxis    2. Chronic respiratory failure with hypercapnia (HCC)    3. Acute blood loss anemia    4. Acute on chronic combined systolic and diastolic congestive heart failure, NYHA class 2 (HCC)    5. Type 2 diabetes mellitus with hyperglycemia, without long-term current use of insulin (HCC)    6. Need for pneumococcal vaccination    7. Need for COVID-19 vaccine    8. Moderate episode of recurrent major depressive disorder [facility])      He is here for follow-up after hospitalization [employer]  with recurrent epistaxis.  He is on chronic anticoagulation which may have been contributing.  He is doing better now and has not had any recurrence of the bleeding.  He did have anemia from acute blood loss, I do want a repeat his CBC today.    From a chronic respiratory failure standpoint, this is multifactorial given his heart failure as well as his COPD and chronic bronchiectasis.  He does appear to be stable on 4 L, so we will continue with that at home.  I did place a new order.    From a heart failure standpoint, he appears stable today.  He is having some presyncope symptoms which sounds like orthostatic hypotension.  I advised her to reduce his spironolactone to 25 mg once daily.  I also did check some labs today including CBC and a BMP.    From a diabetes standpoint, we will check a urine micro creatinine today.  He is on insulin and seems to be tolerating this well.  He is not had any hypoglycemic episodes.  I am going to refer him to our diabetes educators as well as our pharmacy team to help with titration.  I put in for a freestyle libre which I think will help with blood sugar checks.    His depression appears stable, so we will continue with the duloxetine.    Given the Pneumovax as well as the Moderna COVID-19 booster vaccine today    Total of 40 minutes were spent on the same day of the visit including preparing to see the patient, obtaining and/or reviewing separately obtained history, performing a medically appropriate examination and/or evaluation, counseling and educating the patient/family/caregiver, ordering medications, tests, or procedures, referring and communication with other health care professionals, documenting clinical information in the electronic or other health record, independently interpreting results and communicating results to the patient/family/caregiver, and care coordination.           There are no Patient Instructions on file for this visit.    Return in 4 weeks (on 06/15/2020).

## 2020-05-18 NOTE — Progress Notes
Patient presents for Pneumovax-23 vaccination.  Patient verified name and date of birth.  Consent signed for this vaccination.  Patient gave verbal consent for this RN to administer.  Injected 0.5 mL into patient's left deltoid.  Patient tolerated well.  Vaccine Information Statement (VIS) sheet denied by patient.   Donnella Bi, RN    Patient presents for [facility] booster COVID-19 vaccination.  Patient verified name and date of birth.  Consent signed for this vaccination.  Patient gave verbal consent for this RN to administer.  Injected 0.25 mL into patient's left deltoid.  Patient tolerated well.  Vaccine Information Statement (VIS) sheet denied by patient.  Patient understood requirement to wait 15 minute observation period.  Patient waited in waiting area.  Patient left clinic with no concerns.  Donnella Bi, RN

## 2020-05-18 NOTE — Progress Notes
Date of Service: 05/18/2020    Ruben Wood. Ruben Wood is a 80 y.o. male.   He follows with Dr. Vanetta Shawl.    HPI     Ruben Wood. is seen today in the heart failure clinic in a post hospital visit.  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     [name] was recently hospitalized at [facility] 2/11?04/26/2020 for acute asthma exacerbation who presented again?with progressive shortness of breath and cough that did not improve with 5 day doxycycline course, ultimately?requiring transfer to the ICU on 2/17.?PTA diuretic regimen was increased and patient was started on?antipseudomonal?antibiotic course. He was subsequently weaned off comfort flow and respiratory status stable on HFNC approaching baseline O2 requirement.?Diuretics back to PTA dose.  He was started on anticoagulation with Xarelto for his PAF.  He was treated for left lower extremity cellulitis just prior to discharge and was discharged with Keflex to finish 7 days. He was discharged to home with PT.    Patient's wife did report on 3/18 that patient had admission to [facility] 3/13-3/17 for epistaxis.  His Xarelto was held x4 days and then resumed.  He was treated with IV and oral antibiotics for possible aspiration pneumonia.    [name] presents today accompanied by his wife, Ruben Wood.  He reports a weight on his home scale at 290.6 pounds.  He is lost approximately 10 pounds since his hospital discharge.  He has been working on diet modification in an effort to lose weight.  He is having some occasional dizziness.  When discussing his medications he reports taking his metolazone 2-3 times since his hospital discharge.  When asked what causes him to use this medication he states just when I think I have a little extra fluid.  His last dose was 2 to 3 days ago.  When further discussing it sounds like there may be a correlation between his metolazone dosing and his dizziness.  His dyspnea on exertion has remained stable and he has good days and bad days.  He denies chest pain, palpitation or heart racing, syncope, strokelike symptoms.  His lower extremity edema remains stable.  He denies any further epistaxis or signs or symptoms of bleeding.           Vitals:    05/18/20 1359   BP: 107/59   BP Source: Arm, Right Upper   Patient Position: Sitting   Pulse: 97   SpO2: 100%   Weight: 131.8 kg (290 lb 9.6 oz)   Height: 180.3 cm (5' 11)   PainSc: Zero     Body mass index is 40.53 kg/m?Ruben Wood     Past Medical History  Patient Active Problem List    Diagnosis Date Noted   ? Complex care coordination 05/04/2017     Priority: High     Class: Acute   ? Epistaxis 05/14/2020     Was a problem after starting anticoagulation 04/2020, but this has been an ongoing issue for him. On O2 via NC at baseline.         ? Gout 04/03/2020   ? Dyspnea 04/02/2020   ? 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 Chronic  combined systolic (congestive) and diastolic (congestive) heart failure (HCC) 11/25/2019   ? Chronic bronchitis (HCC) 11/25/2019   ? Chronic bronchitis with acute exacerbation (HCC) 12/05/2018   ? 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 Type 2 diabetes mellitus with hyperglycemia, without long-term current use of insulin (HCC) 03/12/2018   ? Atrial fibrillation (HCC) 03/12/2018   ? Chronic respiratory failure with hypercapnia (HCC) 11/16/2017   ? Depression 11/15/2017   ? OAB (overactive bladder)      Urinary frequency, urgency, urge urinary incontinence (UUI), nocturia, nocturnal enuresis.  -- trial Mirabegron 25 mg --> improved, but persistent sx's.  -- trial Mirabegron 50 mg.     ? Urinary incontinence, urge      See OAB A&P note.     ? Coronary artery disease due to lipid rich plaque 06/29/2017   ? Bronchiectasis with acute lower respiratory infection (HCC) 05/10/2017   ? 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     ? 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  D/c'd Tamsulosin d/t lack of symptom improvement.     ? Enrolled in chronic care management 02/28/2017   ? History of elevated prostate specific antigen (PSA)      PNBx (03/01/2017): (L) high-grade prostatic intraepithelial neoplasia (HG PIN), 1/6 cores; [name], PA-C.     ? Spondylolisthesis, lumbar region 12/13/2016   ? Lumbar post-laminectomy syndrome      L4-5     ? Spinal stenosis of lumbosacral region 09/20/2016   ? Spondylolisthesis of lumbosacral region 09/20/2016   ? Osteoarthritis of spine with radiculopathy, lumbosacral region 09/20/2016   ? 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 He had a normal response to pneumococca vaccination; he had 17/23 positive serotypes. He has normal tetanus toxoid Ab and CH50.  His T&B cell panel revealed a mildly low B-cell count at 68 right after a time of acute illness.  He had positive diphtheria antibody.    Likely multifactorial with significant comorbidities including frequent steroid use for bronchiectasis exacerbations, frequent infections, and possibly a nutritional component (low total protein).    Previously on doxycycline once daily for recurrent infections, which he initially felt had been tremendously helpful, but he has had a couple episodes of what may be pneumonia in early 2022 when he was off.     Plan:  - Restart doxycycline 100 mg daily.  - Recommend getting the Pneumovax immunization next time he is in clinic in person and will need repeat pneumococcal antibody levels 1 month afterwards once he has had resolution of his COVID issues.          ? Moderate persistent asthma with acute exacerbation 02/25/2016     He has had asthma since sometime in his 20-30s. He gets cough, chest tightness, wheezing, shortness of breath that is year round with worsening in the Spring and Fall.   Triggers: URI, hot/cold air. Additionally he has been diagnosed with bronchiectasis and emphysema; his PFTs suggest both obstructive and restrictive disease and asthma is unlikely to be the sole cause of his dyspnea.    He has negative IgE immunocaps to  aeroallergens which makes the possibility of allergic asthma highly unlikely. His IgE level was 17 and he had negative ANCAs, MPO and PR3.     Repeatedly being treated with steroids now for flares.  Also on hypertonic saline nebs, DuoNebs, budesnoide/Brovana via neb, and Singulair as well as aggressive pulmonary clearance with vest and Aerobika.        ? 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 He is not having issues with urticaria.    - We recommend IgE immunocaps to aeroallergens.      ? Recurrent infections 02/25/2016     Recurrent sinopulmonary infections requiring antibiotics 5-10 times per year.   Cellulitis and non-healing wounds.   Many underlying illnesses predisposing him to these infections in addition to intermittent hypogammaglobulinemia associated with active infections and systemic steroids.       ? S/P left pulmonary artery pressure sensor implant placement (CardioMEMs)  02/02/2016     * Patient has CardioMEMs (Pulmonary Artery Pressure Sensor)* Please call Matthew Folks, CardioMEMs Program Coordinator [phone] or page Heart Failure Rounding Team if patient is admitted or presents to ED*  03/30/15 implant     ? Ischemic cardiomyopathy 01/12/2016   ? 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 ICD (implantable cardioverter-defibrillator), biventricular, in situ 12/07/2015   ? Hypercholesterolemia 11/25/2015   ? 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 Bronchiectasis without complication (HCC) 06/18/2015     Non-sputum producer.  Currently on Symbicort.  - Dr. [name] was able to get him a vest to wear at home for secretions and this has helped immensely with his secretions and breathing.      ? Cellulitis of left lower extremity 11/12/2014     10/2014: admitted with cellulitis, [place] negative for venous clot, MRI without osteomyelitis.      11/12/2014 In clinic today, still with cellulitis.  Per patient and his wife, the erythema may be extending.  Cultures from drainage in hospital with MSSA, but Blood Cx negative.  No e/o osteomyelitis.  My concern is that this antibiotic regimen is not adequately treating his cellulitis.  We will broaden coverage to get MRSA with doxycycline.  Patient and wife given strict call/return criteria.  I will see patient in 3-4 days for re-evaluation.  No fever today.   Plan:  Stop cefpodoxime and start doxycylcine    11/17/2014 Much better, no warmth and erythema minimal.    Plan: continue doxycyline for full 10 day course.        ? Osteoarthritis of spine with radiculopathy, lumbar region 07/28/2014   ? Acute on chronic combined systolic and diastolic congestive heart failure, NYHA class 2 (HCC) 07/19/2014     11/04/14: EF 20%, severely dilated LV with grade 1 diastolic dysfunction.  11/12/2014 In clinic today, patient appears fairly well compensated.  He does have some LEE, but no significant rales (just some at bases), no JVD sitting upright, and no sx to suggest decompensation.  We will continue metoprolol, spironolactone, and bumex at current doses.    11/17/2014 Edema much improved in lower ext, Cr trending down on last labs.  Will recheck labs today.  Advised to weigh himself daily.  Rechecking BMP today to ensure not overdoing diuretics.     On metoprolol, spironolactone.  Will need to explore allergy to ARB more as patient may benefit from ACEI.      03/30/15 CardioMEMS implant by Dr. Chales Abrahams  1. Normal cardiac output and cardiac index.  2. Normal pulmonary pressures and normal pulmonary capillary wedge pressure.  3. Successful insertion of CardioMEMS pulmonary artery pressure sensor     ? 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      ? Stage 3 chronic kidney disease (HCC) 08/26/2013   ? Mixed dyslipidemia 08/26/2013     Hepatic Function    Lab Results   Component Value Date/Time    ALBUMIN 4.0 11/04/2014 12:26 AM    TOTAL PROTEIN 6.9 11/04/2014 12:26 AM    ALK PHOSPHATASE 61 11/04/2014 12:26 AM    Lab Results   Component Value Date/Time    AST (SGOT) 41* 11/04/2014 12:26 AM    ALT (SGPT) 19 11/04/2014 12:26 AM    TOTAL BILIRUBIN 1.4* 11/04/2014 12:26 AM        Lab Results   Component Value Date    CHOL 148 12/15/2013    TRIG 122 12/15/2013    HDL 51 12/15/2013    LDL 88 12/15/2013    VLDL 24 12/15/2013    NONHDLCHOL 97 12/15/2013       BP Readings from Last 3 Encounters:   11/12/14 129/80   11/09/14 111/44   11/04/14 146/65     Plan:   80 y.o. with known CAD with CTO of RCA and obtuse marginal branch with high grade stenosis (90%) in mid left circ.    Advised heart healthy diet and daily exercise.    Continue high intensity statin, atorvastatin       ? AAA (abdominal aortic aneurysm) (HCC) 10/15/2012     Duplex done at OSH in 2007 and 2008 ~ 4.1 cm    Duplex 10/15/12-AAA 7.3 cm AP x 7.3 cm       ? History of MRSA infection 10/14/2012   ? CAD (coronary artery disease), native coronary artery 08/15/2012     07/11/2002- Cath @ [facility] showed Severe double vessel disease(OMB of circumflex and distal circ)  inferior-basilar dyskinesis.                  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    09/12/12   Cath - [facility]:  CTO of the right coronary artery, CTO of the obtuse marginal branch and high-grade stenosis of   90% in the mid left circumflex artery No significant stenosis in the left anterior descending artery or left main vessel     Failed attempt in opening 2nd obtuse marginal CTO as described above.    10/14/12: Unsuccessful attempt to open CTO of OMB and mid-CFX by Dr. Mackey Birchwood      04/02/17: Cath by  Dr. Steward Ros:   4. Chronic total occlusion of the circumflex.  5. Chronic total occlusion of the right coronary artery, status post percutaneous coronary intervention with drug-eluting stent times 2.  6. Small distal right coronary artery perforation without echo or hemodynamic evidence of compromise.    06/29/17-cardiac catheterization by Dr. Steward Ros. One DES placed to chronic total occlusion of the circumflex artery. The RCA stent was patent.     ? OSA on CPAP 08/13/2012     CPAP at  DME: [facility]    Split night:  08/14/2017  AHI 43.2  REM n/a  Time below 88 %: 98 mins  Lowest sat: 83%     CPAP 10 cm h20 effective      ? COPD (chronic obstructive pulmonary disease) (HCC) 08/13/2012     04/28/13: PFTs with more restrictive pattern with FEV1 50%, FEV1/FVC 89%, DLCO 65%.    12/27/13: PFTs with FEV1 82%, FEV1/FVC 113%, and DLCO 77%     ? 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  Discussed the importance of diet.  Exercise as tolerated.        ? Essential hypertension 08/13/2012   ? Chest wall deformity 07/09/2012     Chest wall reconstruction on 07/09/12  Lung herniation- bio bridge         ROS?Per HPI    Physical Exam  General Appearance: In NAD, obese  Neck Veins: normal JVP, neck veins are not distended; no HJR   Chest Inspection: chest is normal in appearance   Respiratory Effort: breathing comfortably, no respiratory distress   Auscultation/Percussion: lungs diminished but clear to auscultation, faint chronic bilat rales, no rhonchi or wheezing   Cardiac Rhythm: regular rhythm and normal rate   Cardiac Auscultation: S1, S2 normal, no rub, no definite S3  or S4   Murmurs: no murmur   Peripheral Circulation: normal peripheral circulation   Pedal Pulses: normal symmetric pedal pulses   Lower Extremity Edema: Trace-1+ lower extremity edema, venous discoloration  Abdominal Exam: soft, non-tender, no obvious masses, bowel sounds normal   Gait & Station: Seated in a wheelchair  Orientation: oriented to person, place and time   Affect & Mood: appropriate and sustained affect   Language and Memory: patient responsive and seems to comprehend information   Neurologic Exam: neurological assessment grossly intact   Vital signs were reviewed    Cardiovascular Studies    Echocardiogram?11/26/19: LV severely dilated, severe predominantly basal septal hypertrophy, LV EF 35%, unable to assess diastolic function.  RV moderately dilated with normal wall thickness and systolic function.  IVS 1.79 cm.  LA moderately dilated, RA mildly dilated.  CVP 0-5 mmHg.  MVR moderate?severe, eccentric regurgitation, mean gradient 3-4 mmHg.  Mild TVR.  PASP 37 mmHg.    Cardiovascular Health Factors  Vitals BP Readings from Last 3 Encounters:   05/18/20 107/59   05/18/20 111/69   04/25/20 (!) 103/39     Wt Readings from Last 3 Encounters:   05/18/20 131.8 kg (290 lb 9.6 oz)   05/18/20 131.8 kg (290 lb 9.6 oz)   04/29/20 136.1 kg (300 lb)     BMI Readings from Last 3 Encounters:   05/18/20 40.53 kg/m?   05/18/20  40.53 kg/m?   04/29/20 41.84 kg/m?      Smoking Social History     Tobacco Use   Smoking Status Former Smoker   ? Packs/day: 2.00   ? Years: 40.00   ? Pack years: 80.00   ? Types: Cigarettes   ? Quit date: 07/09/1998   ? Years since quitting: 21.8   Smokeless Tobacco Never Used      Lipid Profile Cholesterol   Date Value Ref Range Status   04/04/2020 161 <200 MG/DL Final     HDL   Date Value Ref Range Status   04/04/2020 53 >40 MG/DL Final     LDL   Date Value Ref Range Status   04/04/2020 87 <100 mg/dL Final     Triglycerides   Date Value Ref Range Status   04/04/2020 152 (H) <150 MG/DL Final      Blood Sugar Hemoglobin A1C   Date Value Ref Range Status   04/04/2020 6.7 (H) 4.0 - 6.0 % Final     Comment:     The ADA recommends that most patients with type 1 and type 2 diabetes maintain   an A1c level <7%.       Glucose   Date Value Ref Range Status   04/26/2020 120 (H) 70 - 100 MG/DL Final   16/11/9602 540 (H) 70 - 100 MG/DL Final   98/12/9145 829 (H) 70 - 100 MG/DL Final     Glucose Fasting   Date Value Ref Range Status   12/15/2014 111  Final     Glucose, POC   Date Value Ref Range Status   04/26/2020 172 (H) 70 - 100 MG/DL Final   56/21/3086 578 (H) 70 - 100 MG/DL Final   46/96/2952 841 (H) 70 - 100 MG/DL Final          Problems Addressed Today  Encounter Diagnoses   Name Primary?   ? Chronic combined systolic (congestive) and diastolic (congestive) heart failure (HCC) Yes   ? Essential hypertension    ? Ischemic cardiomyopathy    ? Stage 3 chronic kidney disease, unspecified whether stage 3a or 3b CKD (HCC)        Assessment and Plan     ?  Chronic?combined systolic and diastolic?HFrEF, ?EF: 35%.  ICM  Moderate?severe MR  Echocardiogram?11/26/19: LV severely dilated, severe predominantly basal septal hypertrophy, LV EF 35%, unable to assess diastolic function. ?RV moderately dilated with normal wall thickness and systolic function. ?IVS 1.79 cm. ?LA moderately dilated, RA mildly dilated. ?CVP 0-5 mmHg. ?MVR moderate?severe, eccentric regurgitation, mean gradient 3-4 mmHg. ?Mild TVR. ?PASP 37 mmHg.  - 12/02/19 RHC shows RA 9, RV 37/8, PA 33/19 (24), PA sat 63%, PCWP 14, CO/CI (Fick) 6.51/2.58, CO/CI (TDL) 7.9/3.14, TPG 10, PVR 1.5, SVR 1056  -NYHA class III, stage C.  The patient's weight on the office scale today is down 10 lbs since hospital discharge.  The patient appears euvolemic on examination.  - continue taking Bumex 3 mg twice daily.  He has metolazone 2.5 mg available as instructed.  - CardioMEMS in situ, and the PA diastolic goal 14?15 with threshold 12?17.  PAD is 11 today, his range has been 11?15 since hospital discharge.  GDMT PTA Changes   BB None?due to bronchiectasis ?   ACEI/ARB/ARNI N/A-renal dysfunction ?   Aldosterone Antagonist Spironolactone 25 mg bid     SGLT2 Inhibitor  Jardiance 25 mg daily     Hydralazine/Nitrate N/A ?   Ivabradine N/A ?  HRMT CRT-D ?   Anticoagulation for Afib/flutter N/A  ?   Cardiac Rehab ? ?   > BMP today    CKD, stage III  - Baseline creatinine 1.3?1.7  - On day of discharge the creatinine was 1.68  > Repeat BMP today  ?  CRT-D in situ  - Placed 11/25/2015 by Dr. Betti Cruz  -04/05/2020 in hospital device interrogation shows BiV pacing 95%, AP 8.2%.  AT/AF burden < 1%. ?  ?  Paroxysmal AFib/Flutter  -Started on Xarelto 20 mg daily during hospitalization February?March 2022.    -He denies any further epistaxis or signs or symptoms of bleeding since discharge from [facility] on 3/17.  -Most recent device interrogation as above  - CHADs2VASc score 5 (HF,HTN, age, DM2)  > Continue Xarelto 20 mg daily  > He has an appointment with Dr. Betti Cruz on 4/11 to discuss watchman LAA closure device  ?  CAD  -?LHC 06/29/17: DES to the CTO left circumflex obtuse marginal?2 by Dr. Alden Server. ?Patent stent in proximal RCA, 30% distal left main stenosis with circumferential calcium noted on the OCT-?Prior left cardiac catheterization 04/02/17 with DES x2 to the RCA w/ small distal?RCA perforation, trace?pericardial effusion;?known total occlusion of the CX.??Left main and LAD have 30-40%?dz.?  >?Continue ASA 81 mg, atorvastatin 40 mg daily.  ?  PAD  - 10/7 ABI ?+ doppler BLE shows normal ABI to BLE  - 10/7 RLE arterial duplex shows 50-75% stenosis to mid R SFA with elevated velocity, turbulence and ratio  - 10/7 LLE arterial duplex shows 50-75% stenosis to L EIA, 50-75% stenosis to distal L CFA and proximal L SFA w/ elevated velocity and ratio along w/ monophasic waveform; focal stenosis unable to be ruled out  - 10/8 PV venous insufficiency BLE study shows no residual reflux   - pt established with Dr Elizabeth Palau; plan for conservative treatment with Jonne Ply and statin   ?  Chronic bronchitis/COPD?/bronchiectasis  Asthma exacerbation   Prior COVID-19 infection with hospitalization  Oxygen dependent   - Pt follows with Dr. Lambert Mody from Pulmonary team  - Wears 4 L O2 at baseline  > Next appointment with Dr. Lambert Mody 08/27/2020  ?  Morbid obesity:  - Body mass index is 40.53 kg/m?.  > Patient will continue with diet modification in an effort to lose weight  ?  OSA:> CPAP nightly    Follow-up:     He has been encouraged to contact us prior to the next appointment with any questions, concerns, or worsening symptoms.    Thank you for allowing me to participate in the care of this patient. If you have any questions please do not hesitate to contact our office.      Bunnie Philips, ANP-BC, MSN, [facility] - CVM Advance Practice Provider  Heart Failure APP Coordinator  [facility] [place] [facility]   Phone [phone]  - Fax [phone] - [email or web address]  [address], [place], [place] [address]  Collaborating Physician Dr. Harlow Mares         Total Time Today was 45 minutes in the following activities: Preparing to see the patient, Obtaining and/or reviewing separately obtained history, Performing a medically appropriate examination and/or evaluation, Counseling and educating the patient/family/caregiver, Ordering medications, tests, or procedures and Documenting clinical information in the electronic or other health record    Current Medications (including today's revisions)  ? acetaminophen (TYLENOL) 325 mg tablet Take two tablets by mouth every 6 hours as needed for Pain.   ?  albuterol 0.083% (PROVENTIL) 2.5 mg /3 mL (0.083 %) nebulizer solution Inhale 3 mL solution by nebulizer as directed every 6 hours as needed for Wheezing or Shortness of Breath. Indications: asthma   ? albuterol sulfate (PROAIR HFA) 90 mcg/actuation HFA aerosol inhaler Inhale two puffs by mouth into the lungs every 4 hours as needed for Wheezing or Shortness of Breath.   ? allopurinoL (ZYLOPRIM) 300 mg tablet Take 300 mg by mouth daily. Take with food.   ? AMITR/GABAPEN/EMU OIL 05/24/08% CREAM (COMPOUND) Apply topically to affected area three times daily as needed.   ? arformoteroL (BROVANA) 15 mcg/2 mL nebulizer solution Inhale 2 mL solution by nebulizer as directed twice daily. Dx: J44.9   ? ASCORBIC ACID-ASCORBATE SODIUM PO Take 500 mg by mouth every morning.   ? aspirin EC 81 mg tablet Take one tablet by mouth daily. Take with food.   ? atorvastatin (LIPITOR) 40 mg tablet Take one tablet by mouth daily.   ? benzonatate (TESSALON PERLES) 100 mg capsule Take one capsule by mouth every 8 hours.   ? budesonide (PULMICORT) 0.25 mg/2 mL nebulizer solution Inhale 2 mL solution by nebulizer as directed twice daily. Dx: J44.9   ? bumetanide (BUMEX) 1 mg tablet Take three tablets by mouth twice daily.   ? cholecalciferol (VITAMIN D-3) 1,000 units tablet Take 1,000 Units by mouth daily.   ? doxycycline hyclate (VIBRACIN) 100 mg tablet Take one tablet by mouth daily for 30 days.   ? duloxetine DR (CYMBALTA) 60 mg capsule Take one capsule by mouth daily.   ? empagliflozin (JARDIANCE) 25 mg tablet Take one tablet by mouth daily.   ? finasteride (PROSCAR) 5 mg tablet Take one tablet by mouth daily.   ? fish oil- omega 3-DHA/EPA 300/1,000 mg capsule Take 1 capsule by mouth daily.   ? flash glucose scanning reader (FREESTYLE LIBRE) reader Use as directed.   ? flash glucose sensor (FREESTYLE LIBRE 14 DAY SENSOR) sensor Type 2 diabetes  Indications: type 2 diabetes mellitus   ? gabapentin (NEURONTIN) 100 mg capsule Take four capsules by mouth twice daily.   ? insulin aspart (U-100) (NOVOLOG FLEXPEN U-100 INSULIN) 100 unit/mL (3 mL) PEN Inject seven Units under the skin three times daily with meals.   ? insulin glargine (LANTUS SOLOSTAR U-100 INSULIN) 100 unit/mL (3 mL) subcutaneous PEN Inject fifteen Units under the skin at bedtime daily.   ? insulin pen needles (disposable) (BD ULTRA-FINE MINI PEN NEEDLE) 31 gauge x 3/16 pen needle Use with insulin pens   ? ipratropium bromide (ATROVENT) 21 mcg (0.03 %) nasal spray Apply two sprays to each nostril as directed every 12 hours.   ? metOLazone (ZAROXOLYN) 2.5 mg tablet Take one tablet by mouth daily as needed. PER OUTPATIENT CARDIOLOGY PROVIDER AS NEEDED FOR FLUID RETENTION   ? Miscellaneous Medical Supply misc Rx: Please wrap legs with ACE bandage from toes as high up to the leg as possible bilaterally  Dx: Lymphedema   ? montelukast (SINGULAIR) 10 mg tablet Take one tablet by mouth at bedtime daily.   ? nitroglycerin (NITROSTAT) 0.4 mg tablet Place 0.4 mg under tongue every 5 minutes as needed for Chest Pain. Max of 3 tablets, call 911.   ? nystatin (MYCOSTATIN) 100,000 units/mL oral suspension Swish and Swallow 5 mL by mouth as directed four times daily.   ? oxyCODONE (ROXICODONE) 5 mg tablet Take one tablet to two tablets by mouth every 8 hours as needed for Pain   ? pantoprazole DR (  PROTONIX) 40 mg tablet Take one tablet by mouth twice daily.   ? polyethylene glycol 3350 (MIRALAX) 17 g packet Take two packets by mouth twice daily.   ? predniSONE (DELTASONE) 10 mg tablet Take 10 mg by mouth as Needed. Takes only when instructed by PCP for COPD exacerbations   ? rivaroxaban (XARELTO) 20 mg tablet Take one tablet by mouth daily with dinner. Take with food.   ? senna/docusate (SENOKOT-S) 8.6/50 mg tablet Take one tablet by mouth twice daily.   ? sodium chloride (NEBUSAL) 3 % nebulizer solution Inhale 4 mL by mouth into the lungs twice daily. Dx:  J47.9   ? spironolactone (ALDACTONE) 25 mg tablet Take one tablet by mouth twice daily. Take with food.   ? tamsulosin (FLOMAX) 0.4 mg capsule TAKE (1) CAPSULE BY MOUTH ONCE DAILY.   ? zinc sulfate 220 mg (50 mg elemental zinc) capsule Take 220 mg by mouth daily.

## 2020-05-18 NOTE — Progress Notes
05-18-20  O2 request received.  F/u call to [facility] [phone]) to confirm pts DME location.  Nadine Counts confirmed pt receives O2/CPAP from the [place] office.  Acknowledged.  Call concluded.    F/u call to [facility] @ [place] [phone]) to inquire about pts O2 needs.  Jilda Panda confirms pt does not like the O2 concentrator for 6-10 L as this was required as pts O2 needs increased.  Pt requested that he go back to the 4 L device.  Jilda Panda confirms he needs documentation confirming pts current liter flow at 4 L or less to return pt to the previous concentrator.  SW confirmed having updated visit notes and an order/Rx stating this.  SW inquired about the best fax # to send the updated info.  Fax # [phone] provided and accepted.  Call concluded.    O2 updates faxed to [facility] as requested.  Confirmation received of successful fax sent.

## 2020-05-20 ENCOUNTER — Encounter: Admit: 2020-05-20 | Discharge: 2020-05-20 | Payer: MEDICARE

## 2020-05-20 MED ORDER — ALLOPURINOL 300 MG PO TAB
300 mg | ORAL_TABLET | Freq: Every day | ORAL | 1 refills | 60.00000 days | Status: AC
Start: 2020-05-20 — End: ?

## 2020-05-20 NOTE — Telephone Encounter
Received fax from pharmacy requesting allopurinol.  LR: unknown  LOV:05/18/20    Routing to Dr. Crecencio Mc to approve.

## 2020-05-21 ENCOUNTER — Encounter: Admit: 2020-05-21 | Discharge: 2020-05-21 | Payer: MEDICARE

## 2020-05-21 NOTE — Telephone Encounter
Attempted to contact Ruben Wood. to schedule an appointment with the primary care pharmacist per their primary care provider's request. Left voicemail asking patient to contact us at [phone] to set up an appointment.    Thornell Sartorius

## 2020-05-24 ENCOUNTER — Encounter: Admit: 2020-05-24 | Discharge: 2020-05-24 | Payer: MEDICARE

## 2020-05-24 NOTE — Telephone Encounter
-----   Message from Marinda Elk sent at 05/24/2020  8:40 AM CDT -----  Regarding: YMR- Device Alert  We received a device alert from patient's device on 4/2-    BiV Percent Pacing Less Than Limit  Current overall BiVP since 12/09/2018 is 93%. Drop in BiVP appears to be due to PVCs. Overall PVC burden is 4.2%, but likely underestimated due to PVCs falling within PR interval and not counted by device.    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 Please review and follow up as needed.

## 2020-05-24 NOTE — Telephone Encounter
Call to pt to assess for symptoms of increased PVCs. Spoke to wife, pt not available. Wife does report pt is more fatigued. Wife states pt is having a rough morning and is hard to get him to answer questions. He has had so many other complicating factors lately, its hard to distinguish if he is having symptoms from heart or other things. Gave symptoms to watch out for and to CB to our office. Pt spouse verbalized understanding and agreement to plan.

## 2020-05-26 ENCOUNTER — Ambulatory Visit: Admit: 2020-05-26 | Discharge: 2020-05-27 | Payer: MEDICARE

## 2020-05-26 ENCOUNTER — Encounter: Admit: 2020-05-26 | Discharge: 2020-05-26 | Payer: MEDICARE

## 2020-05-27 ENCOUNTER — Encounter: Admit: 2020-05-27 | Discharge: 2020-05-27 | Payer: MEDICARE

## 2020-05-27 DIAGNOSIS — E1165 Type 2 diabetes mellitus with hyperglycemia: Secondary | ICD-10-CM

## 2020-05-28 ENCOUNTER — Encounter: Admit: 2020-05-28 | Discharge: 2020-05-28 | Payer: MEDICARE

## 2020-05-28 DIAGNOSIS — L89302 Pressure ulcer of unspecified buttock, stage 2: Secondary | ICD-10-CM

## 2020-05-28 MED ORDER — OZEMPIC 0.25 MG OR 0.5 MG(2 MG/1.5 ML) SC PNIJ
SUBCUTANEOUS | 3 refills | Status: AC
Start: 2020-05-28 — End: ?

## 2020-05-28 NOTE — Telephone Encounter
My chart message sent to patient letting him know ozempic has been sent to [facility] as requested.    Etta Grandchild, RN

## 2020-05-28 NOTE — Telephone Encounter
-----   Message from [name], MD sent at 05/28/2020 12:50 PM CDT -----  Don Broach,     Thank you for your help.  This is a good question.  He was recently started on insulin during a hospitalization in [place].  I saw him for hospital discharge follow-up and did not make any changes at that time as he was doing well with the insulin.  I would love for him to get off of the insulin and a GLP-1 agonist would be a good option for him.      Wynona Canes, can you see if Ed would be willing to take Ozempic like [name] is?  I think we could just stop his insulin entirely given that his A1c is only 7.0% if we add Ozempic.  We can see how his blood sugar goes with the Ozempic and the empagliflozin.  We would have options to add Metformin as well at some point.  The Ozempic would likely lead to more weight loss of the Metformin, so would like to start there.    Branden   ----- Message -----  From: Scherrie Gerlach, RD  Sent: 05/27/2020   2:00 PM CDT  To: [name], MD    Hello Dr Comfort,   Thank you for the referral for diabetes education. Ed and [name] were wondering why he isn't on non-insulin diabetes medication? Apparently [name] is on Ozempic and while it is expensive she thinks it would help Ed as well? Apparently he has never even been on metformin before? He isn't currently on steroids.    Also, I worked with Health visitor to get the [employer] script sent to the DME so it can get covered by medicare.     I will continue to follow with them.     Many Thanks,   Lurena Joiner

## 2020-05-28 NOTE — Telephone Encounter
I called and spoke with patient and his wife.  They are in agreement to Ozempic.  Routing to Dr. Crecencio Mc to place ozempic orders with instructions.  They would like this sent to [facility] in [place]    Referral to wound clinic was mistakenly placed in wife's chart as wife sent my chart message under her account instead of patients.  Ordered as Dr. Crecencio Mc transcribed. Discontinued order in patients wife chart. Discussed recommendation to sending messages for this patient under his my chart account going forward so his information would be in his chart.  They verbalize understanding and appreciative of the assistance.  Wound clinic called patient while I was on the phone on patient's wife's number and arranging wound clinic appointment.    Patient's wife notes he' having some fluid retention and fullness but did well today at physical therapy.  Advised to have him go to ED over weekend with worsening of symptoms.  Verbalized understanding.    Etta Grandchild, RN

## 2020-05-28 NOTE — Telephone Encounter
Great.     Let's STOP insulin.     START semaglutide 0.25mg  Qweekly x 4 weeks and then increase to 0.5mg  Qweekly.    We should keep a close eye on his blood sugar in the interim.

## 2020-05-30 NOTE — Progress Notes
Date of Service: 06/01/2020       Subjective:             Ruben Stelma. is a 80 y.o. male.      Chief Complaint   Patient presents with   ? Enlarged Prostrate - Bph         History of Present Illness  Very pleasant Caucasian gentleman w/ multiple medical & CV co-morbidities, w/ BPH/ LUTS.  Previously followed by Dr. Oswaldo Milian.    (+)BPH/ LUTS.  TRUS Prostate (03/01/2017): Prostate volume = 38.3 mL.  -- taking Tamsulosin.  -- started Finasteride ~ Oct. 23, 2019.    (+)h/o acute urinary retention during inpt rehab following fall & (R) shoulder dislocation ~ 11/14/2017.  Passed voiding trial on 12/17/2017.    (+)OAB, including urianry frequency, urgency, urge urinary incontinence (UUI), nocturnal enuresis, & nocturia.  -- Mirabegron 25 mg --> (+)improved, but still bothersome OAB sx's.  -- Mirabegron 50 mg --> no longer taking this med.    (+)taking diuretic.  (+)exacerbates OAB sx's, but he states sx's are manageable at this time.    (+)elevated PSA.  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    D/c'd routine PSA/ prostate cancer screening f/u as of March 2021.    Currently, pt states he is voiding well w/o obstructive LUTS.    No new urologic concerns or complaints today.    Presents to Urology Clinic today for routine f/u.           Review of Systems      Objective:         ? acetaminophen (TYLENOL) 325 mg tablet Take two tablets by mouth every 6 hours as needed for Pain.   ? albuterol 0.083% (PROVENTIL) 2.5 mg /3 mL (0.083 %) nebulizer solution Inhale 3 mL solution by nebulizer as directed every 6 hours as needed for Wheezing or Shortness of Breath. Indications: asthma   ? albuterol sulfate (PROAIR HFA) 90 mcg/actuation HFA aerosol inhaler Inhale two puffs by mouth into the lungs every 4 hours as needed for Wheezing or Shortness of Breath.   ? allopurinoL (ZYLOPRIM) 300 mg tablet Take one tablet by mouth daily. Take with food.   ? AMITR/GABAPEN/EMU OIL 05/24/08% CREAM (COMPOUND) Apply topically to affected area three times daily as needed.   ? arformoteroL (BROVANA) 15 mcg/2 mL nebulizer solution Inhale 2 mL solution by nebulizer as directed twice daily. Dx: J44.9   ? ASCORBIC ACID-ASCORBATE SODIUM PO Take 500 mg by mouth every morning.   ? aspirin EC 81 mg tablet Take one tablet by mouth daily. Take with food.   ? atorvastatin (LIPITOR) 40 mg tablet Take one tablet by mouth daily.   ? benzonatate (TESSALON PERLES) 100 mg capsule Take one capsule by mouth every 8 hours.   ? budesonide (PULMICORT) 0.25 mg/2 mL nebulizer solution Inhale 2 mL solution by nebulizer as directed twice daily. Dx: J44.9   ? bumetanide (BUMEX) 1 mg tablet Take three tablets by mouth twice daily.   ? cholecalciferol (VITAMIN D-3) 1,000 units tablet Take 1,000 Units by mouth daily.   ? doxycycline hyclate (VIBRACIN) 100 mg tablet Take one tablet by mouth daily for 30 days.   ? duloxetine DR (CYMBALTA) 60 mg capsule Take one capsule by mouth daily.   ? empagliflozin (JARDIANCE) 25 mg tablet Take one tablet by mouth daily.   ? finasteride (PROSCAR) 5 mg tablet Take one tablet by mouth daily.   ?  fish oil- omega 3-DHA/EPA 300/1,000 mg capsule Take 1 capsule by mouth daily.   ? flash glucose scanning reader (FREESTYLE LIBRE) reader Use as directed.   ? flash glucose sensor (FREESTYLE LIBRE 14 DAY SENSOR) sensor Type 2 diabetes  Indications: type 2 diabetes mellitus   ? gabapentin (NEURONTIN) 100 mg capsule Take four capsules by mouth twice daily.   ? insulin pen needles (disposable) (BD ULTRA-FINE MINI PEN NEEDLE) 31 gauge x 3/16 pen needle Use with insulin pens   ? ipratropium bromide (ATROVENT) 21 mcg (0.03 %) nasal spray Apply two sprays to each nostril as directed every 12 hours.   ? metOLazone (ZAROXOLYN) 2.5 mg tablet Take one tablet by mouth daily as needed. PER OUTPATIENT CARDIOLOGY PROVIDER AS NEEDED FOR FLUID RETENTION   ? Miscellaneous Medical Supply misc Rx: Please wrap legs with ACE bandage from toes as high up to the leg as possible bilaterally  Dx: Lymphedema   ? montelukast (SINGULAIR) 10 mg tablet Take one tablet by mouth at bedtime daily.   ? nitroglycerin (NITROSTAT) 0.4 mg tablet Place 0.4 mg under tongue every 5 minutes as needed for Chest Pain. Max of 3 tablets, call 911.   ? nystatin (MYCOSTATIN) 100,000 units/mL oral suspension Swish and Swallow 5 mL by mouth as directed four times daily.   ? oxyCODONE (ROXICODONE) 5 mg tablet Take one tablet to two tablets by mouth every 8 hours as needed for Pain   ? pantoprazole DR (PROTONIX) 40 mg tablet Take one tablet by mouth twice daily.   ? polyethylene glycol 3350 (MIRALAX) 17 g packet Take two packets by mouth twice daily.   ? predniSONE (DELTASONE) 10 mg tablet Take 10 mg by mouth as Needed. Takes only when instructed by PCP for COPD exacerbations   ? rivaroxaban (XARELTO) 20 mg tablet Take one tablet by mouth daily with dinner. Take with food.   ? semaglutide (OZEMPIC) 0.25 mg or 0.5 mg(2 mg/1.5 mL) injection PEN Inject one-quarter mg under the skin every 7 days for 30 days, THEN one-half mg every 7 days for 60 days.   ? senna/docusate (SENOKOT-S) 8.6/50 mg tablet Take one tablet by mouth twice daily.   ? sodium chloride (NEBUSAL) 3 % nebulizer solution Inhale 4 mL by mouth into the lungs twice daily. Dx:  J47.9   ? spironolactone (ALDACTONE) 25 mg tablet Take one tablet by mouth twice daily. Take with food.   ? tamsulosin (FLOMAX) 0.4 mg capsule TAKE (1) CAPSULE BY MOUTH ONCE DAILY.   ? zinc sulfate 220 mg (50 mg elemental zinc) capsule Take 220 mg by mouth daily.         Vitals:    06/01/20 1051   BP: 119/62   Pulse: 99   Resp: 22   Weight: 130.2 kg (287 lb)   Height: 180.3 cm (5' 11)   PainSc: Zero       Body mass index is 40.03 kg/m?[name]       Physical Exam  Vitals reviewed.   Constitutional:       General: He is not in acute distress.     Appearance: He is well-developed.   HENT:      Head: Normocephalic and atraumatic.   Pulmonary:      Effort: No respiratory distress.      Comments: O2 via nasal cannula.  Abdominal:      Comments: Morbidly obese.   Neurological:      Mental Status: He is alert and oriented to person, place,  and time.   Psychiatric:         Behavior: Behavior normal.         Thought Content: Thought content normal.         Judgment: Judgment normal.                [place] PVR = 31 mL.        Assessment and Plan:    Problem   Benign prostatic hyperplasia (BPH)    TRUS Prostate (03/01/2017): Prostate volume = 38.3 mL.  D/c'd Tamsulosin d/t lack of symptom improvement.           Benign prostatic hyperplasia (BPH)  Continue Tamsulosin 0.4 mg q day.  Continue Finasteride 5 mg q day.  RTC 1 yr, repeat bladder scan.               Marin Roberts, PA-C  Urology

## 2020-05-31 ENCOUNTER — Ambulatory Visit: Admit: 2020-05-31 | Discharge: 2020-06-01 | Payer: MEDICARE

## 2020-05-31 ENCOUNTER — Encounter: Admit: 2020-05-31 | Discharge: 2020-05-31 | Payer: MEDICARE

## 2020-05-31 DIAGNOSIS — M954 Acquired deformity of chest and rib: Secondary | ICD-10-CM

## 2020-05-31 DIAGNOSIS — L03116 Cellulitis of left lower limb: Secondary | ICD-10-CM

## 2020-05-31 DIAGNOSIS — J45909 Unspecified asthma, uncomplicated: Secondary | ICD-10-CM

## 2020-05-31 DIAGNOSIS — J189 Pneumonia, unspecified organism: Secondary | ICD-10-CM

## 2020-05-31 DIAGNOSIS — I714 Abdominal aortic aneurysm, without rupture: Secondary | ICD-10-CM

## 2020-05-31 DIAGNOSIS — Z95810 Presence of automatic (implantable) cardiac defibrillator: Secondary | ICD-10-CM

## 2020-05-31 DIAGNOSIS — J449 Chronic obstructive pulmonary disease, unspecified: Secondary | ICD-10-CM

## 2020-05-31 DIAGNOSIS — E7800 Pure hypercholesterolemia, unspecified: Secondary | ICD-10-CM

## 2020-05-31 DIAGNOSIS — Z8614 Personal history of Methicillin resistant Staphylococcus aureus infection: Secondary | ICD-10-CM

## 2020-05-31 DIAGNOSIS — J438 Other emphysema: Secondary | ICD-10-CM

## 2020-05-31 DIAGNOSIS — Z95828 Presence of other vascular implants and grafts: Secondary | ICD-10-CM

## 2020-05-31 DIAGNOSIS — I83022 Varicose veins of left lower extremity with ulcer of calf: Secondary | ICD-10-CM

## 2020-05-31 DIAGNOSIS — I10 Essential (primary) hypertension: Secondary | ICD-10-CM

## 2020-05-31 DIAGNOSIS — I87332 Chronic venous hypertension (idiopathic) with ulcer and inflammation of left lower extremity: Secondary | ICD-10-CM

## 2020-05-31 DIAGNOSIS — I2510 Atherosclerotic heart disease of native coronary artery without angina pectoris: Secondary | ICD-10-CM

## 2020-05-31 DIAGNOSIS — N401 Enlarged prostate with lower urinary tract symptoms: Secondary | ICD-10-CM

## 2020-05-31 DIAGNOSIS — K219 Gastro-esophageal reflux disease without esophagitis: Secondary | ICD-10-CM

## 2020-05-31 DIAGNOSIS — J479 Bronchiectasis, uncomplicated: Secondary | ICD-10-CM

## 2020-05-31 DIAGNOSIS — R053 Chronic cough: Secondary | ICD-10-CM

## 2020-05-31 DIAGNOSIS — G4733 Obstructive sleep apnea (adult) (pediatric): Secondary | ICD-10-CM

## 2020-05-31 DIAGNOSIS — U071 Pneumonia due to COVID-19 virus: Secondary | ICD-10-CM

## 2020-05-31 DIAGNOSIS — M961 Postlaminectomy syndrome, not elsewhere classified: Secondary | ICD-10-CM

## 2020-05-31 DIAGNOSIS — Z8679 Personal history of other diseases of the circulatory system: Secondary | ICD-10-CM

## 2020-05-31 DIAGNOSIS — R609 Edema, unspecified: Secondary | ICD-10-CM

## 2020-05-31 DIAGNOSIS — R0600 Dyspnea, unspecified: Secondary | ICD-10-CM

## 2020-05-31 DIAGNOSIS — N183 CKD (chronic kidney disease) stage 3, GFR 30-59 ml/min (HCC): Secondary | ICD-10-CM

## 2020-05-31 DIAGNOSIS — I255 Ischemic cardiomyopathy: Secondary | ICD-10-CM

## 2020-05-31 DIAGNOSIS — I509 Heart failure, unspecified: Secondary | ICD-10-CM

## 2020-05-31 DIAGNOSIS — J439 Emphysema, unspecified: Secondary | ICD-10-CM

## 2020-05-31 DIAGNOSIS — I429 Cardiomyopathy, unspecified: Secondary | ICD-10-CM

## 2020-05-31 DIAGNOSIS — Z9981 Dependence on supplemental oxygen: Secondary | ICD-10-CM

## 2020-05-31 DIAGNOSIS — I5042 Chronic combined systolic (congestive) and diastolic (congestive) heart failure: Secondary | ICD-10-CM

## 2020-05-31 DIAGNOSIS — E782 Mixed hyperlipidemia: Secondary | ICD-10-CM

## 2020-05-31 DIAGNOSIS — E1165 Type 2 diabetes mellitus with hyperglycemia: Secondary | ICD-10-CM

## 2020-05-31 DIAGNOSIS — J302 Other seasonal allergic rhinitis: Secondary | ICD-10-CM

## 2020-05-31 NOTE — Telephone Encounter
I called the patient to be sure he had his telehealth visit instructions that I sent to him in MyChart. I encouraged him to try logging on so all troubleshooting could be done prior to his appointment time. Patient gave clarification that he understood the instructions sent to him and completed them successfully.

## 2020-05-31 NOTE — Progress Notes
Daily CardioMEMS Readings    Goal PA Diastolic: 15 mmHg   PA Diastolic Thresholds: 12-17 mmHg

## 2020-05-31 NOTE — Patient Instructions
Stephanie Stokka, RN is the Navigator for the Watchman procedure. She will be in touch with you to introduce herself and schedule the remainder of your appointments as well as review your Watchman procedure plan. Feel free to send an email to initiate contact.     Her contact information is as follows:    Stephanie Stokka, RN   Cardiac Navigator  [phone] office  [email or web address]    Feel free to reach out should you have questions.

## 2020-05-31 NOTE — Progress Notes
HPI       Dictation on: 05/31/2020 12:37 PM by: Gerilyn Pilgrim MADHU [[id number]]         He wants [place] to call his wife to schedule the procedure and his wife's number is wife's Number is [phone].       Vitals:    05/31/20 1017   Weight: 131.5 kg (290 lb)   Height: 180.3 cm (5' 11)   PainSc: Zero     Body mass index is 40.45 kg/m?[name]     Past Medical History  Patient Active Problem List    Diagnosis Date Noted   ? Complex care coordination 05/04/2017     Priority: High     Class: Acute   ? Epistaxis 05/14/2020     Was a problem after starting anticoagulation 04/2020, but this has been an ongoing issue for him. On O2 via NC at baseline.         ? Gout 04/03/2020   ? Dyspnea 04/02/2020   ? 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 Chronic bronchitis (HCC) 11/25/2019   ? Chronic bronchitis with acute exacerbation (HCC) 12/05/2018   ? 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 Type 2 diabetes mellitus with hyperglycemia, without long-term current use of insulin (HCC) 03/12/2018   ? Atrial fibrillation (HCC) 03/12/2018   ? Chronic respiratory failure with hypercapnia (HCC) 11/16/2017   ? Depression 11/15/2017   ? OAB (overactive bladder)      Urinary frequency, urgency, urge urinary incontinence (UUI), nocturia, nocturnal enuresis.  -- trial Mirabegron 25 mg --> improved, but persistent sx's.  -- trial Mirabegron 50 mg.     ? Urinary incontinence, urge      See OAB A&P note.     ? Coronary artery disease due to lipid rich plaque 06/29/2017   ? Bronchiectasis with acute lower respiratory infection (HCC) 05/10/2017   ? 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     ? 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  D/c'd Tamsulosin d/t lack of symptom improvement.     ? Enrolled in chronic care management 02/28/2017   ? History of elevated prostate specific antigen (PSA)      PNBx (03/01/2017): (L) high-grade prostatic intraepithelial neoplasia (HG PIN), 1/6 cores; [name], PA-C.     ? Spondylolisthesis, lumbar region 12/13/2016   ? Lumbar post-laminectomy syndrome      L4-5     ? Spinal stenosis of lumbosacral region 09/20/2016   ? Spondylolisthesis of lumbosacral region 09/20/2016   ? Osteoarthritis of spine with radiculopathy, lumbosacral region 09/20/2016   ? 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 He had a normal response to pneumococca vaccination; he had 17/23 positive serotypes. He has normal tetanus toxoid Ab and CH50.  His T&B cell panel revealed a mildly low B-cell count at 68 right after a time of acute illness.  He had positive diphtheria antibody.    Likely multifactorial with significant comorbidities including frequent steroid use for bronchiectasis exacerbations, frequent infections, and possibly a nutritional component (  low total protein).    Previously on doxycycline once daily for recurrent infections, which he initially felt had been tremendously helpful, but he has had a couple episodes of what may be pneumonia in early 2022 when he was off.     Plan:  - Restart doxycycline 100 mg daily.  - Recommend getting the Pneumovax immunization next time he is in clinic in person and will need repeat pneumococcal antibody levels 1 month afterwards once he has had resolution of his COVID issues.          ? Moderate persistent asthma with acute exacerbation 02/25/2016     He has had asthma since sometime in his 20-30s. He gets cough, chest tightness, wheezing, shortness of breath that is year round with worsening in the Spring and Fall. Triggers: URI, hot/cold air. Additionally he has been diagnosed with bronchiectasis and emphysema; his PFTs suggest both obstructive and restrictive disease and asthma is unlikely to be the sole cause of his dyspnea.    He has negative IgE immunocaps to aeroallergens which makes the possibility of allergic asthma highly unlikely. His IgE level was 17 and he had negative ANCAs, MPO and PR3.     Repeatedly being treated with steroids now for flares.  Also on hypertonic saline nebs, DuoNebs, budesnoide/Brovana via neb, and Singulair as well as aggressive pulmonary clearance with vest and Aerobika.        ? 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 He is not having issues with urticaria.    - We recommend IgE immunocaps to aeroallergens.      ? Recurrent infections 02/25/2016     Recurrent sinopulmonary infections requiring antibiotics 5-10 times per year.   Cellulitis and non-healing wounds.   Many underlying illnesses predisposing him to these infections in addition to intermittent hypogammaglobulinemia associated with active infections and systemic steroids.       ? S/P left pulmonary artery pressure sensor implant placement (CardioMEMs)  02/02/2016     * Patient has CardioMEMs (Pulmonary Artery Pressure Sensor)* Please call Matthew Folks, CardioMEMs Program Coordinator [phone] or page Heart Failure Rounding Team if patient is admitted or presents to ED*  03/30/15 implant     ? Ischemic cardiomyopathy 01/12/2016   ? 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 ICD (implantable cardioverter-defibrillator), biventricular, in situ 12/07/2015   ? Hypercholesterolemia 11/25/2015   ? 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 Bronchiectasis without complication (HCC) 06/18/2015     Non-sputum producer.  Currently on Symbicort.  - Dr. [name] was able to get him a vest to wear at home for secretions and this has helped immensely with his secretions and breathing.      ? Cellulitis of left lower extremity 11/12/2014     10/2014: admitted with cellulitis, [place] negative for venous clot, MRI without osteomyelitis.      11/12/2014 In clinic today, still with cellulitis.  Per patient and his wife, the erythema may be extending.  Cultures from drainage in hospital with MSSA, but Blood Cx negative.  No e/o osteomyelitis.  My concern is that this antibiotic regimen  is not adequately treating his cellulitis.  We will broaden coverage to get MRSA with doxycycline.  Patient and wife given strict call/return criteria.  I will see patient in 3-4 days for re-evaluation.  No fever today.   Plan:  Stop cefpodoxime and start doxycylcine    11/17/2014 Much better, no warmth and erythema minimal.    Plan: continue doxycyline for full 10 day course.        ? Osteoarthritis of spine with radiculopathy, lumbar region 07/28/2014   ? Acute on chronic combined systolic and diastolic congestive heart failure, NYHA class 2 (HCC) 07/19/2014     11/04/14: EF 20%, severely dilated LV with grade 1 diastolic dysfunction.  11/12/2014 In clinic today, patient appears fairly well compensated.  He does have some LEE, but no significant rales (just some at bases), no JVD sitting upright, and no sx to suggest decompensation.  We will continue metoprolol, spironolactone, and bumex at current doses.    11/17/2014 Edema much improved in lower ext, Cr trending down on last labs.  Will recheck labs today.  Advised to weigh himself daily.  Rechecking BMP today to ensure not overdoing diuretics.     On metoprolol, spironolactone.  Will need to explore allergy to ARB more as patient may benefit from ACEI. 03/30/15 CardioMEMS implant by Dr. Chales Abrahams  1. Normal cardiac output and cardiac index.  2. Normal pulmonary pressures and normal pulmonary capillary wedge pressure.  3. Successful insertion of CardioMEMS pulmonary artery pressure sensor     ? 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      ? Stage 3 chronic kidney disease (HCC) 08/26/2013   ? Mixed dyslipidemia 08/26/2013     Hepatic Function    Lab Results   Component Value Date/Time    ALBUMIN 4.0 11/04/2014 12:26 AM    TOTAL PROTEIN 6.9 11/04/2014 12:26 AM    ALK PHOSPHATASE 61 11/04/2014 12:26 AM    Lab Results   Component Value Date/Time    AST (SGOT) 41* 11/04/2014 12:26 AM    ALT (SGPT) 19 11/04/2014 12:26 AM    TOTAL BILIRUBIN 1.4* 11/04/2014 12:26 AM        Lab Results   Component Value Date    CHOL 148 12/15/2013    TRIG 122 12/15/2013    HDL 51 12/15/2013    LDL 88 12/15/2013    VLDL 24 12/15/2013    NONHDLCHOL 97 12/15/2013       BP Readings from Last 3 Encounters:   11/12/14 129/80   11/09/14 111/44   11/04/14 146/65     Plan:   80 y.o. with known CAD with CTO of RCA and obtuse marginal branch with high grade stenosis (90%) in mid left circ.    Advised heart healthy diet and daily exercise.    Continue high intensity statin, atorvastatin       ? AAA (abdominal aortic aneurysm) (HCC) 10/15/2012     Duplex done at OSH in 2007 and 2008 ~ 4.1 cm    Duplex 10/15/12-AAA 7.3 cm AP x 7.3 cm       ? History of  MRSA infection 10/14/2012   ? CAD (coronary artery disease), native coronary artery 08/15/2012     07/11/2002- Cath @ [facility] showed Severe double vessel disease(OMB of circumflex and distal circ)  inferior-basilar dyskinesis.                  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    09/12/12   Cath - [facility]:  CTO of the right coronary artery, CTO of the obtuse marginal branch and high-grade stenosis of   90% in the mid left circumflex artery No significant stenosis in the left anterior descending artery or left main vessel     Failed attempt in opening 2nd obtuse marginal CTO as described above.    10/14/12: Unsuccessful attempt to open CTO of OMB and mid-CFX by Dr. Mackey Birchwood      04/02/17: Cath by Dr. Steward Ros:   4. Chronic total occlusion of the circumflex.  5. Chronic total occlusion of the right coronary artery, status post percutaneous coronary intervention with drug-eluting stent times 2.  6. Small distal right coronary artery perforation without echo or hemodynamic evidence of compromise.    06/29/17-cardiac catheterization by Dr. Steward Ros. One DES placed to chronic total occlusion of the circumflex artery. The RCA stent was patent.     ? OSA on CPAP 08/13/2012     CPAP at  DME: [facility]    Split night:  08/14/2017  AHI 43.2  REM n/a  Time below 88 %: 98 mins  Lowest sat: 83%     CPAP 10 cm h20 effective      ? COPD (chronic obstructive pulmonary disease) (HCC) 08/13/2012     04/28/13: PFTs with more restrictive pattern with FEV1 50%, FEV1/FVC 89%, DLCO 65%.    12/27/13: PFTs with FEV1 82%, FEV1/FVC 113%, and DLCO 77%     ? 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  Discussed the importance of diet.  Exercise as tolerated.        ? Essential hypertension 08/13/2012   ? Chest wall deformity 07/09/2012     Chest wall reconstruction on 07/09/12  Lung herniation- bio bridge           Review of Systems   Constitutional: Positive for malaise/fatigue.   HENT: Negative.    Eyes: Negative.    Cardiovascular: Negative.    Respiratory: Positive for shortness of breath.    Endocrine: Negative.    Hematologic/Lymphatic: Negative.    Skin: Negative.    Musculoskeletal: Negative.    Gastrointestinal: Negative.    Genitourinary: Negative.    Neurological: Positive for dizziness, light-headedness and loss of balance.   Psychiatric/Behavioral: Negative.    Allergic/Immunologic: Negative.        Physical Exam   Constitutional: He appears well-developed and well-nourished.   Morbidly Obese   HENT:   Head: Atraumatic.   Eyes: Left eye exhibits no discharge.   Pulmonary/Chest: No respiratory distress.   Musculoskeletal:         General: No edema.   Neurological: He is alert and oriented to  person, place, and time.   Psychiatric: He has a normal mood and affect. His behavior is normal.             Cardiovascular Health Factors  Vitals BP Readings from Last 3 Encounters:   05/18/20 107/59   05/18/20 111/69   04/25/20 (!) 103/39     Wt Readings from Last 3 Encounters:   05/31/20 131.5 kg (290 lb)   05/18/20 131.8 kg (290 lb 9.6 oz)   05/18/20 131.8 kg (290 lb 9.6 oz)     BMI Readings from Last 3 Encounters:   05/31/20 40.45 kg/m?   05/18/20 40.53 kg/m?   05/18/20 40.53 kg/m?      Smoking Social History     Tobacco Use   Smoking Status Former Smoker   ? Packs/day: 2.00   ? Years: 40.00   ? Pack years: 80.00   ? Types: Cigarettes   ? Quit date: 07/09/1998   ? Years since quitting: 21.9   Smokeless Tobacco Never Used      Lipid Profile Cholesterol   Date Value Ref Range Status   04/04/2020 161 <200 MG/DL Final     HDL   Date Value Ref Range Status   04/04/2020 53 >40 MG/DL Final     LDL   Date Value Ref Range Status   04/04/2020 87 <100 mg/dL Final     Triglycerides   Date Value Ref Range Status   04/04/2020 152 (H) <150 MG/DL Final      Blood Sugar Hemoglobin A1C   Date Value Ref Range Status   04/04/2020 6.7 (H) 4.0 - 6.0 % Final     Comment:     The ADA recommends that most patients with type 1 and type 2 diabetes maintain   an A1c level <7%.       Glucose   Date Value Ref Range Status   05/18/2020 115 (H) 70 - 100 MG/DL Final   19/14/7829 562 (H) 70 - 100 MG/DL Final   13/09/6576 469 (H) 70 - 100 MG/DL Final     Glucose Fasting   Date Value Ref Range Status   12/15/2014 111  Final     Glucose, POC   Date Value Ref Range Status   04/26/2020 172 (H) 70 - 100 MG/DL Final   62/95/2841 324 (H) 70 - 100 MG/DL Final   40/11/2723 366 (H) 70 - 100 MG/DL Final          Problems Addressed Today  No diagnosis found.             Current Medications (including today's revisions)  ? acetaminophen (TYLENOL) 325 mg tablet Take two tablets by mouth every 6 hours as needed for Pain.   ? albuterol 0.083% (PROVENTIL) 2.5 mg /3 mL (0.083 %) nebulizer solution Inhale 3 mL solution by nebulizer as directed every 6 hours as needed for Wheezing or Shortness of Breath. Indications: asthma   ? albuterol sulfate (PROAIR HFA) 90 mcg/actuation HFA aerosol inhaler Inhale two puffs by mouth into the lungs every 4 hours as needed for Wheezing or Shortness of Breath.   ? allopurinoL (ZYLOPRIM) 300 mg tablet Take one tablet by mouth daily. Take with food.   ? AMITR/GABAPEN/EMU OIL 05/24/08% CREAM (COMPOUND) Apply topically to affected area three times daily as needed.   ? arformoteroL (BROVANA) 15 mcg/2 mL nebulizer solution Inhale 2 mL solution by nebulizer as directed twice daily. Dx: J44.9   ? ASCORBIC ACID-ASCORBATE SODIUM PO Take 500 mg  by mouth every morning.   ? aspirin EC 81 mg tablet Take one tablet by mouth daily. Take with food.   ? atorvastatin (LIPITOR) 40 mg tablet Take one tablet by mouth daily.   ? benzonatate (TESSALON PERLES) 100 mg capsule Take one capsule by mouth every 8 hours.   ? budesonide (PULMICORT) 0.25 mg/2 mL nebulizer solution Inhale 2 mL solution by nebulizer as directed twice daily. Dx: J44.9   ? bumetanide (BUMEX) 1 mg tablet Take three tablets by mouth twice daily.   ? cholecalciferol (VITAMIN D-3) 1,000 units tablet Take 1,000 Units by mouth daily.   ? doxycycline hyclate (VIBRACIN) 100 mg tablet Take one tablet by mouth daily for 30 days.   ? duloxetine DR (CYMBALTA) 60 mg capsule Take one capsule by mouth daily.   ? empagliflozin (JARDIANCE) 25 mg tablet Take one tablet by mouth daily.   ? finasteride (PROSCAR) 5 mg tablet Take one tablet by mouth daily.   ? fish oil- omega 3-DHA/EPA 300/1,000 mg capsule Take 1 capsule by mouth daily.   ? flash glucose scanning reader (FREESTYLE LIBRE) reader Use as directed.   ? flash glucose sensor (FREESTYLE LIBRE 14 DAY SENSOR) sensor Type 2 diabetes  Indications: type 2 diabetes mellitus   ? gabapentin (NEURONTIN) 100 mg capsule Take four capsules by mouth twice daily.   ? insulin pen needles (disposable) (BD ULTRA-FINE MINI PEN NEEDLE) 31 gauge x 3/16 pen needle Use with insulin pens   ? ipratropium bromide (ATROVENT) 21 mcg (0.03 %) nasal spray Apply two sprays to each nostril as directed every 12 hours.   ? metOLazone (ZAROXOLYN) 2.5 mg tablet Take one tablet by mouth daily as needed. PER OUTPATIENT CARDIOLOGY PROVIDER AS NEEDED FOR FLUID RETENTION   ? Miscellaneous Medical Supply misc Rx: Please wrap legs with ACE bandage from toes as high up to the leg as possible bilaterally  Dx: Lymphedema   ? montelukast (SINGULAIR) 10 mg tablet Take one tablet by mouth at bedtime daily.   ? nitroglycerin (NITROSTAT) 0.4 mg tablet Place 0.4 mg under tongue every 5 minutes as needed for Chest Pain. Max of 3 tablets, call 911.   ? nystatin (MYCOSTATIN) 100,000 units/mL oral suspension Swish and Swallow 5 mL by mouth as directed four times daily.   ? oxyCODONE (ROXICODONE) 5 mg tablet Take one tablet to two tablets by mouth every 8 hours as needed for Pain   ? pantoprazole DR (PROTONIX) 40 mg tablet Take one tablet by mouth twice daily.   ? polyethylene glycol 3350 (MIRALAX) 17 g packet Take two packets by mouth twice daily.   ? predniSONE (DELTASONE) 10 mg tablet Take 10 mg by mouth as Needed. Takes only when instructed by PCP for COPD exacerbations   ? rivaroxaban (XARELTO) 20 mg tablet Take one tablet by mouth daily with dinner. Take with food.   ? semaglutide (OZEMPIC) 0.25 mg or 0.5 mg(2 mg/1.5 mL) injection PEN Inject one-quarter mg under the skin every 7 days for 30 days, THEN one-half mg every 7 days for 60 days.   ? senna/docusate (SENOKOT-S) 8.6/50 mg tablet Take one tablet by mouth twice daily.   ? sodium chloride (NEBUSAL) 3 % nebulizer solution Inhale 4 mL by mouth into the lungs twice daily. Dx:  J47.9   ? spironolactone (ALDACTONE) 25 mg tablet Take one tablet by mouth twice daily. Take with food.   ? tamsulosin (FLOMAX) 0.4 mg capsule TAKE (1) CAPSULE BY MOUTH ONCE DAILY.   ?  zinc sulfate 220 mg (50 mg elemental zinc) capsule Take 220 mg by mouth daily.

## 2020-06-01 ENCOUNTER — Encounter: Admit: 2020-06-01 | Discharge: 2020-06-01 | Payer: MEDICARE

## 2020-06-01 ENCOUNTER — Ambulatory Visit: Admit: 2020-06-01 | Discharge: 2020-06-02 | Payer: MEDICARE

## 2020-06-01 DIAGNOSIS — E782 Mixed hyperlipidemia: Secondary | ICD-10-CM

## 2020-06-01 DIAGNOSIS — J45909 Unspecified asthma, uncomplicated: Secondary | ICD-10-CM

## 2020-06-01 DIAGNOSIS — R609 Edema, unspecified: Secondary | ICD-10-CM

## 2020-06-01 DIAGNOSIS — Z8614 Personal history of Methicillin resistant Staphylococcus aureus infection: Secondary | ICD-10-CM

## 2020-06-01 DIAGNOSIS — L03116 Cellulitis of left lower limb: Secondary | ICD-10-CM

## 2020-06-01 DIAGNOSIS — M954 Acquired deformity of chest and rib: Secondary | ICD-10-CM

## 2020-06-01 DIAGNOSIS — R053 Chronic cough: Secondary | ICD-10-CM

## 2020-06-01 DIAGNOSIS — Z9981 Dependence on supplemental oxygen: Secondary | ICD-10-CM

## 2020-06-01 DIAGNOSIS — Z95828 Presence of other vascular implants and grafts: Secondary | ICD-10-CM

## 2020-06-01 DIAGNOSIS — M961 Postlaminectomy syndrome, not elsewhere classified: Secondary | ICD-10-CM

## 2020-06-01 DIAGNOSIS — K219 Gastro-esophageal reflux disease without esophagitis: Secondary | ICD-10-CM

## 2020-06-01 DIAGNOSIS — N183 CKD (chronic kidney disease) stage 3, GFR 30-59 ml/min (HCC): Secondary | ICD-10-CM

## 2020-06-01 DIAGNOSIS — Z8679 Personal history of other diseases of the circulatory system: Secondary | ICD-10-CM

## 2020-06-01 DIAGNOSIS — J302 Other seasonal allergic rhinitis: Secondary | ICD-10-CM

## 2020-06-01 DIAGNOSIS — I429 Cardiomyopathy, unspecified: Secondary | ICD-10-CM

## 2020-06-01 DIAGNOSIS — N401 Enlarged prostate with lower urinary tract symptoms: Secondary | ICD-10-CM

## 2020-06-01 DIAGNOSIS — I509 Heart failure, unspecified: Secondary | ICD-10-CM

## 2020-06-01 DIAGNOSIS — R0600 Dyspnea, unspecified: Secondary | ICD-10-CM

## 2020-06-01 DIAGNOSIS — U071 Pneumonia due to COVID-19 virus: Secondary | ICD-10-CM

## 2020-06-01 DIAGNOSIS — J189 Pneumonia, unspecified organism: Secondary | ICD-10-CM

## 2020-06-01 DIAGNOSIS — J479 Bronchiectasis, uncomplicated: Secondary | ICD-10-CM

## 2020-06-01 DIAGNOSIS — I2510 Atherosclerotic heart disease of native coronary artery without angina pectoris: Secondary | ICD-10-CM

## 2020-06-01 DIAGNOSIS — I10 Essential (primary) hypertension: Secondary | ICD-10-CM

## 2020-06-01 DIAGNOSIS — G4733 Obstructive sleep apnea (adult) (pediatric): Secondary | ICD-10-CM

## 2020-06-01 DIAGNOSIS — I714 Abdominal aortic aneurysm, without rupture: Secondary | ICD-10-CM

## 2020-06-01 DIAGNOSIS — J449 Chronic obstructive pulmonary disease, unspecified: Secondary | ICD-10-CM

## 2020-06-01 DIAGNOSIS — I5042 Chronic combined systolic (congestive) and diastolic (congestive) heart failure: Secondary | ICD-10-CM

## 2020-06-01 NOTE — Assessment & Plan Note
Continue Tamsulosin 0.4 mg q day.  Continue Finasteride 5 mg q day.  RTC 1 yr, repeat bladder scan.

## 2020-06-01 NOTE — Progress Notes
06/01/20 PVR- 31 ml.

## 2020-06-02 ENCOUNTER — Encounter: Admit: 2020-06-02 | Discharge: 2020-06-02 | Payer: MEDICARE

## 2020-06-04 ENCOUNTER — Encounter: Admit: 2020-06-04 | Discharge: 2020-06-04 | Payer: MEDICARE

## 2020-06-04 ENCOUNTER — Ambulatory Visit: Admit: 2020-06-04 | Discharge: 2020-06-05 | Payer: MEDICARE

## 2020-06-06 ENCOUNTER — Encounter: Admit: 2020-06-06 | Discharge: 2020-06-06 | Payer: MEDICARE

## 2020-06-08 ENCOUNTER — Encounter: Admit: 2020-06-08 | Discharge: 2020-06-08 | Payer: MEDICARE

## 2020-06-08 MED FILL — ALBUTEROL SULFATE 2.5 MG /3 ML (0.083 %) IN NEBU: 2.5 mg /3 mL (0.083 %) | RESPIRATORY_TRACT | 30 days supply | Qty: 360 | Fill #2 | Status: AC

## 2020-06-08 MED FILL — SODIUM CHLORIDE 3 % IN NEBU: 3 % | RESPIRATORY_TRACT | 30 days supply | Qty: 240 | Fill #2 | Status: AC

## 2020-06-09 ENCOUNTER — Encounter: Admit: 2020-06-09 | Discharge: 2020-06-09 | Payer: MEDICARE

## 2020-06-09 ENCOUNTER — Ambulatory Visit: Admit: 2020-06-09 | Discharge: 2020-06-10 | Payer: MEDICARE

## 2020-06-09 DIAGNOSIS — I5042 Chronic combined systolic (congestive) and diastolic (congestive) heart failure: Secondary | ICD-10-CM

## 2020-06-09 DIAGNOSIS — J9612 Chronic respiratory failure with hypercapnia: Secondary | ICD-10-CM

## 2020-06-09 DIAGNOSIS — J470 Bronchiectasis with acute lower respiratory infection: Secondary | ICD-10-CM

## 2020-06-09 MED ORDER — PREDNISONE 10 MG PO TAB
40 mg | ORAL_TABLET | Freq: Every day | ORAL | 0 refills | Status: AC
Start: 2020-06-09 — End: ?

## 2020-06-09 MED ORDER — LEVOFLOXACIN 750 MG PO TAB
750 mg | ORAL_TABLET | Freq: Every day | ORAL | 0 refills | 7.00000 days | Status: AC
Start: 2020-06-09 — End: ?

## 2020-06-09 MED FILL — BUDESONIDE 0.25 MG/2 ML IN NBSP: 0.25 mg/2 mL | RESPIRATORY_TRACT | 30 days supply | Qty: 120 | Fill #2 | Status: AC

## 2020-06-09 MED FILL — ARFORMOTEROL 15 MCG/2 ML IN NEBU: 15 mcg/2 mL | RESPIRATORY_TRACT | 30 days supply | Qty: 120 | Fill #2 | Status: AC

## 2020-06-09 NOTE — Progress Notes
Did patient read financial policy, consent to treat, and notice of privacy practices? Yes    Does the patient give verbal consent to each policy? Yes    Does the patient have any vitals to report? No    N/A Vitals charted in O2? N/A    Is the patient in pain? No     Screening questions completed? Yes    Is the patient able to acces the Mychart message with the start visit link? Yes    Is patient in "virtual waiting room" Yes    Etta Grandchild, RN

## 2020-06-09 NOTE — Telephone Encounter
Let's try to get him on for a telehealth visit?  The last time, it was thought this was more fluid related. I would like to see and talk to him to better understanding of what's going on.

## 2020-06-09 NOTE — Telephone Encounter
Pt wife called back and left vm stating pharmacy is [facility] in [place].

## 2020-06-09 NOTE — Telephone Encounter
Pts wife [name] called and left vm stating pt has a cough that began Monday.  She asked if he could be started on a medication before the cough gets deeper.  Forwarding to PCP for advice.

## 2020-06-09 NOTE — Telephone Encounter
telehealth scheduled.    Etta Grandchild, RN

## 2020-06-09 NOTE — Progress Notes
History of Present Illness  Ruben Wood. is a 80 y.o. male     Obtained patient's verbal consent to treat them and their agreement to [facility] financial policy and NPP via this telehealth visit during the [facility]    He is having acute increase shortness of breath cough and sputum production from his baseline.  This started about 2 days ago.  This feels consistent with his prior acute bronchitis on chronic bronchitis exacerbations.  He does have a complicated pulmonary and cardiac history.  He has had frequent exacerbations of his chronic bronchitis as well as bronchiectasis.  To complicate matters, he also has chronic systolic heart failure.  He does have a CardioMEMS in place.  He is not sure of his most recent readings, but he has had increased lower extremity swelling.  We have had times in the past when he has increased shortness of breath cough or actually tied to increased volume status.  He has not been checking his oxygen level, but he has been stable on his 2 to 3 L of oxygen.     Review of Systems  Per HPI    Objective:         ? acetaminophen (TYLENOL) 325 mg tablet Take two tablets by mouth every 6 hours as needed for Pain.   ? albuterol 0.083% (PROVENTIL) 2.5 mg /3 mL (0.083 %) nebulizer solution Inhale 3 mL solution by nebulizer as directed every 6 hours as needed for Wheezing or Shortness of Breath. Indications: asthma   ? albuterol sulfate (PROAIR HFA) 90 mcg/actuation HFA aerosol inhaler Inhale two puffs by mouth into the lungs every 4 hours as needed for Wheezing or Shortness of Breath.   ? allopurinoL (ZYLOPRIM) 300 mg tablet Take one tablet by mouth daily. Take with food.   ? AMITR/GABAPEN/EMU OIL 05/24/08% CREAM (COMPOUND) Apply topically to affected area three times daily as needed.   ? arformoteroL (BROVANA) 15 mcg/2 mL nebulizer solution Inhale 2 mL solution by nebulizer as directed twice daily. Dx: J44.9   ? ASCORBIC ACID-ASCORBATE SODIUM PO Take 500 mg by mouth every morning.   ? aspirin EC 81 mg tablet Take one tablet by mouth daily. Take with food.   ? atorvastatin (LIPITOR) 40 mg tablet Take one tablet by mouth daily.   ? benzonatate (TESSALON PERLES) 100 mg capsule Take one capsule by mouth every 8 hours.   ? budesonide (PULMICORT) 0.25 mg/2 mL nebulizer solution Inhale 2 mL solution by nebulizer as directed twice daily. Dx: J44.9   ? bumetanide (BUMEX) 1 mg tablet Take three tablets by mouth twice daily.   ? cholecalciferol (VITAMIN D-3) 1,000 units tablet Take 1,000 Units by mouth daily.   ? doxycycline hyclate (VIBRACIN) 100 mg tablet Take one tablet by mouth daily for 30 days.   ? duloxetine DR (CYMBALTA) 60 mg capsule Take one capsule by mouth daily.   ? empagliflozin (JARDIANCE) 25 mg tablet Take one tablet by mouth daily.   ? finasteride (PROSCAR) 5 mg tablet Take one tablet by mouth daily.   ? fish oil- omega 3-DHA/EPA 300/1,000 mg capsule Take 1 capsule by mouth daily.   ? flash glucose scanning reader (FREESTYLE LIBRE) reader Use as directed.   ? flash glucose sensor (FREESTYLE LIBRE 14 DAY SENSOR) sensor Type 2 diabetes  Indications: type 2 diabetes mellitus   ? gabapentin (NEURONTIN) 100 mg capsule Take four capsules by mouth twice daily.   ? insulin pen needles (disposable) (BD ULTRA-FINE  MINI PEN NEEDLE) 31 gauge x 3/16 pen needle Use with insulin pens   ? ipratropium bromide (ATROVENT) 21 mcg (0.03 %) nasal spray Apply two sprays to each nostril as directed every 12 hours.   ? metOLazone (ZAROXOLYN) 2.5 mg tablet Take one tablet by mouth daily as needed. PER OUTPATIENT CARDIOLOGY PROVIDER AS NEEDED FOR FLUID RETENTION   ? Miscellaneous Medical Supply misc Rx: Please wrap legs with ACE bandage from toes as high up to the leg as possible bilaterally  Dx: Lymphedema   ? montelukast (SINGULAIR) 10 mg tablet Take one tablet by mouth at bedtime daily.   ? nitroglycerin (NITROSTAT) 0.4 mg tablet Place 0.4 mg under tongue every 5 minutes as needed for Chest Pain. Max of 3 tablets, call 911.   ? nystatin (MYCOSTATIN) 100,000 units/mL oral suspension Swish and Swallow 5 mL by mouth as directed four times daily.   ? oxyCODONE (ROXICODONE) 5 mg tablet Take one tablet to two tablets by mouth every 8 hours as needed for Pain   ? pantoprazole DR (PROTONIX) 40 mg tablet Take one tablet by mouth twice daily.   ? polyethylene glycol 3350 (MIRALAX) 17 g packet Take two packets by mouth twice daily.   ? predniSONE (DELTASONE) 10 mg tablet Take 10 mg by mouth as Needed. Takes only when instructed by PCP for COPD exacerbations   ? rivaroxaban (XARELTO) 20 mg tablet Take one tablet by mouth daily with dinner. Take with food.   ? semaglutide (OZEMPIC) 0.25 mg or 0.5 mg(2 mg/1.5 mL) injection PEN Inject one-quarter mg under the skin every 7 days for 30 days, THEN one-half mg every 7 days for 60 days.   ? senna/docusate (SENOKOT-S) 8.6/50 mg tablet Take one tablet by mouth twice daily.   ? sodium chloride (NEBUSAL) 3 % nebulizer solution Inhale 4 mL by mouth into the lungs twice daily. Dx:  J47.9   ? spironolactone (ALDACTONE) 25 mg tablet Take one tablet by mouth twice daily. Take with food.   ? tamsulosin (FLOMAX) 0.4 mg capsule TAKE (1) CAPSULE BY MOUTH ONCE DAILY.   ? zinc sulfate 220 mg (50 mg elemental zinc) capsule Take 220 mg by mouth daily.     Vitals:    06/09/20 1418   PainSc: Zero     Vitals:    06/09/20 1418   PainSc: Zero       There is no height or weight on file to calculate BMI.     Physical Exam  Constitutional:       Appearance: Normal appearance.   HENT:      Head: Normocephalic and atraumatic.   Pulmonary:      Comments: Coughing spells followed by short periods of accessory muscle use, but then able to calm down and breathe more comfortably  Neurological:      General: No focal deficit present.      Mental Status: He is alert.   Psychiatric:         Mood and Affect: Mood normal.         Labwork reviewed:  Lab Results   Component Value Date/Time    HGBA1C 6.7 (H) 04/04/2020 05:40 AM    HGBA1C 5.9 11/25/2019 02:45 PM    HGBA1C 5.9 09/02/2019 09:50 AM    HGBPOC 14.3 04/08/2020 06:58 AM    HGBPOC 11.2 (L) 10/25/2012 10:58 AM    HGBPOC 12.9 (L) 10/25/2012 08:55 AM    MCALBR 7.2 05/18/2020 01:45 PM    TSH 2.49 03/05/2020  08:40 PM    FREET4R 0.99 12/15/2014 09:12 AM    CHOL 161 04/04/2020 05:40 AM    TRIG 152 (H) 04/04/2020 05:40 AM    HDL 53 04/04/2020 05:40 AM    LDL 87 04/04/2020 05:40 AM    NA 135 (L) 05/18/2020 03:09 PM    K 3.5 05/18/2020 03:09 PM    CL 87 (L) 05/18/2020 03:09 PM    CO2 33 (H) 05/18/2020 03:09 PM    GAP 15 (H) 05/18/2020 03:09 PM    BUN 70 (H) 05/18/2020 03:09 PM    CR 2.00 (H) 05/18/2020 03:09 PM    GLU 115 (H) 05/18/2020 03:09 PM    CA 9.5 05/18/2020 03:09 PM    PO4 4.1 03/08/2020 03:51 AM    ALBUMIN 4.0 04/17/2020 05:14 AM    TOTPROT 6.4 04/17/2020 05:14 AM    ALKPHOS 84 04/17/2020 05:14 AM    AST 13 04/17/2020 05:14 AM    ALT 18 04/17/2020 05:14 AM    TOTBILI 0.9 04/17/2020 05:14 AM    GFR 44 01/30/2020 12:00 AM    GFRAA >60 12/03/2019 04:51 AM    PSA 2.96 11/25/2018 12:00 AM              Assessment and Plan:    1. Chronic bronchitis with acute exacerbation (HCC)    2. Bronchiectasis with acute lower respiratory infection (HCC)    3. Chronic combined systolic (congestive) and diastolic (congestive) heart failure (HCC)    4. Chronic respiratory failure with hypercapnia (HCC)      His presentation today is most consistent with an acute exacerbation of his chronic bronchitis and bronchiectasis.  I Minna treat him accordingly with azithromycin as well as a burst of prednisone 40 mg once daily for 5 days.  I have reached out to his heart failure team to get his most recent CardioMEMS readings as it will be important to make sure that his volume status is stable.  This was difficult to fully assess on virtual format, so we will have to rely on his CardioMEMS readings.  He does have some increased lower extremity swelling per history.  I did not make any changes to his diuretics, as I will wait to see what his CardioMEMS readings are doing.  He will continue on his current level of oxygen at 2 to 3 L.  I did advise getting a COVID test as well as an influenza test given that we are still seeing no circulating around the state.  He will get that done at the local clinic in [place].    There are no Patient Instructions on file for this visit.    No follow-ups on file.

## 2020-06-10 DIAGNOSIS — J42 Unspecified chronic bronchitis: Secondary | ICD-10-CM

## 2020-06-10 DIAGNOSIS — J209 Acute bronchitis, unspecified: Principal | ICD-10-CM

## 2020-06-11 ENCOUNTER — Encounter: Admit: 2020-06-11 | Discharge: 2020-06-11 | Payer: MEDICARE

## 2020-06-11 NOTE — Telephone Encounter
Patient's wife called as Dr. Crecencio Mc requested for an update to let him know that they feel the antibiotic is starting to kick in.  Patient is feeling a little better.  COVID an Influenza were both negative.    Wished [place] a nice weekend.    Routing to Dr. Crecencio Mc as Lorain Childes

## 2020-06-12 ENCOUNTER — Encounter: Admit: 2020-06-12 | Discharge: 2020-06-12 | Payer: MEDICARE

## 2020-06-12 DIAGNOSIS — I480 Paroxysmal atrial fibrillation: Secondary | ICD-10-CM

## 2020-06-12 DIAGNOSIS — I4891 Unspecified atrial fibrillation: Secondary | ICD-10-CM

## 2020-06-12 MED ORDER — LIDOCAINE (PF) 10 MG/ML (1 %) IJ SOLN
.2 mL | INTRAMUSCULAR | 0 refills | PRN
Start: 2020-06-12 — End: ?

## 2020-06-12 MED ORDER — ASPIRIN 325 MG PO TAB
325 mg | Freq: Once | ORAL | 0 refills | Status: AC
Start: 2020-06-12 — End: ?

## 2020-06-12 MED ORDER — CEFAZOLIN IVPB
3 g | Freq: Once | INTRAVENOUS | 0 refills
Start: 2020-06-12 — End: ?

## 2020-06-12 MED ORDER — LIDOCAINE HCL 2 % MM JELP
Freq: Once | TOPICAL | 0 refills
Start: 2020-06-12 — End: ?

## 2020-06-12 MED ORDER — SODIUM CHLORIDE 0.9 % IV SOLP
INTRAVENOUS | 0 refills
Start: 2020-06-12 — End: ?

## 2020-06-12 NOTE — Telephone Encounter
Called patient to schedule Watchman implant with Dr. Betti Cruz  Patients wife, [name] agreed to date of service, 11/08/2020  Pre procedure appointments discussed.  [name] verbalized understanding of dates/times/locations of all appointments.  Pre procedure instructions sent via MyChart per request.

## 2020-06-12 NOTE — Patient Instructions
ELECTROPHYSIOLOGY PRE-ADMISSION INSTRUCTIONS    Patient Name: Ruben Wood.  MRN#: [id number]  Date of Birth: 1940-03-13 (80 y.o.)  Today's Date: 06/12/2020    PROCEDURE:  You are scheduled for Left Atrial Appendage Closure Device Placement (Watchman) with Dr. Jerrye Bushy. Reddy.      ARRIVAL TIME:  [facility] to the [facility] admitting office on the ground floor of the [facility] on: 11/08/2020  The EP Lab will call to notify you of your arrival time.  They will call on the business day prior to your procedure.  (If you have any questions regarding your arrival time for the Electrophysiology Lab, please call the EP Lab at [phone].)    PRE-PROCEDURE APPOINTMENTS:  10/27/2020 at 1:30 pm   Office visit to update history and physical (requirement within 30 days of procedure) with Dr. Jerrye Bushy. Betti Cruz at [facility]   10/27/2020 at 2:30 pm Pre-Operative Assessment Clinic visit with Anesthesia Department.  Go to the main hospital entrance of the [facility]. The anesthesia clinic is just inside the entrance on the left side of lobby -  across from the Information Desk.     10/27/2020 Pre-Admission lab work: BMP, CBC and Magnesium at your Pre-Operative Assessment Visit.         SPECIAL MEDICATION INSTRUCTIONS  Nothing to eat or drink after midnight before your procedure.  Take your prescription medications with a sip of water as instructed.  No caffeine for 24 hours prior to your procedure.     Any new prescriptions will be sent to your pharmacy listed on file with [place].   HOLD ALL over the counter vitamins or supplements on the morning of your procedure.  Diuretics: Aldactone (spironolactone) -- hold the morning of your procedure.  and Bumex (bumetanide) -- hold the morning of your procedure.   Hypoglycemics: empagliflozin (Jardiance) -- hold the morning of your procedure  Other: Aspirin -- hold the morning of your procedure  Anticoagulants: Do not take rivaroxaban (Xarelto) on the morning of your procedure. --- DO NOT MISS ANY OTHER DOSES ---         Additional Instructions  If you wear CPAP, please bring your mask and machine with you to the hospital.    Take a bath or shower with anti-bacterial soap the evening before, or the morning of the procedure. We will give this to you at your office visit.     Bring photo ID and your health insurance card(s).    Arrange for a driver to take you home from the hospital.    Bring an accurate list of your current medications with you to the hospital (all meds and supplements taken daily).    Wear comfortable clothes and don't bring valuables, other than photo identification card, with you to the hospital.    Please pack a bag for an overnight stay.     Please review your pre-procedure instructions and call the office at [phone] with any questions. You may ask to speak with any of Dr. Jerrye Bushy. Reddy's nurses. There are several of [place] in the office that can assist you. For questions regarding post procedure care or restrictions please refer to the Your Care Instructions included in the  Left Atrial Appendage Closure Device Placement (Watchman) Packet.     ALLERGIES  Allergies   Allergen Reactions   ? Chlorhexidine BLISTERS and EDEMA       FYI:  There are several follow  up appointments throughout the first year after implantation of this device.  I will contact you to schedule throughout the year.    45 day post procedure Office Visit and TEE (transesophageal echocardiogram)  We want to ensure there are no clots or leakage around the implant before anticoagulation is discontinued and Plavix is started.   6 months post procedure Office Visit  Most patients are able to stop the Plavix at this point and be on aspirin only for life.   1 year post procedure Office Visit and TEE (transesophageal echocardiogram)  This is our first chance to see what the implant looks like with no anticoagulation and no plavix.  We want to ensure there are no clots forming at the implant site.     Also, please remember:    You MUST take antibiotics prior to any dental procedures (including cleaning), invasive respiratory tract procedures and invasive skin procedures for 6 months after your device placement.  This will help to prevent infection to the device.  Contact your primary care provider or cardiologist for an antibiotic prescription to be taken prior to any of the above listed procedures.  _________________________________________  Form completed by: Tempie Hoist, BSN  Date completed: 06/12/20  Method: Via MyChart.     Please let me know if you have any questions or concerns.    Thanks so much,  Tempie Hoist, BSN, RN, [facility]- Nurse Navigator   [facility] [employer] [facility] [place] [facility]  [facility]  Phone: [phone]- [email or web address]  [address], [place], [place] [address]

## 2020-06-14 ENCOUNTER — Encounter: Admit: 2020-06-14 | Discharge: 2020-06-14 | Payer: MEDICARE

## 2020-06-14 ENCOUNTER — Ambulatory Visit: Admit: 2020-06-14 | Discharge: 2020-06-14 | Payer: MEDICARE

## 2020-06-14 ENCOUNTER — Inpatient Hospital Stay: Admit: 2020-06-14 | Discharge: 2020-06-14 | Payer: MEDICARE

## 2020-06-14 DIAGNOSIS — I4891 Unspecified atrial fibrillation: Secondary | ICD-10-CM

## 2020-06-14 DIAGNOSIS — I5042 Chronic combined systolic (congestive) and diastolic (congestive) heart failure: Secondary | ICD-10-CM

## 2020-06-15 ENCOUNTER — Encounter: Admit: 2020-06-15 | Discharge: 2020-06-15 | Payer: MEDICARE

## 2020-06-15 ENCOUNTER — Ambulatory Visit: Admit: 2020-06-15 | Discharge: 2020-06-15 | Payer: MEDICARE

## 2020-06-15 DIAGNOSIS — R0600 Dyspnea, unspecified: Secondary | ICD-10-CM

## 2020-06-15 DIAGNOSIS — J45909 Unspecified asthma, uncomplicated: Secondary | ICD-10-CM

## 2020-06-15 DIAGNOSIS — L03116 Cellulitis of left lower limb: Secondary | ICD-10-CM

## 2020-06-15 DIAGNOSIS — J302 Other seasonal allergic rhinitis: Secondary | ICD-10-CM

## 2020-06-15 DIAGNOSIS — I10 Essential (primary) hypertension: Secondary | ICD-10-CM

## 2020-06-15 DIAGNOSIS — N183 CKD (chronic kidney disease) stage 3, GFR 30-59 ml/min (HCC): Secondary | ICD-10-CM

## 2020-06-15 DIAGNOSIS — Z9981 Dependence on supplemental oxygen: Secondary | ICD-10-CM

## 2020-06-15 DIAGNOSIS — R609 Edema, unspecified: Secondary | ICD-10-CM

## 2020-06-15 DIAGNOSIS — J479 Bronchiectasis, uncomplicated: Secondary | ICD-10-CM

## 2020-06-15 DIAGNOSIS — I714 Abdominal aortic aneurysm, without rupture: Secondary | ICD-10-CM

## 2020-06-15 DIAGNOSIS — I509 Heart failure, unspecified: Secondary | ICD-10-CM

## 2020-06-15 DIAGNOSIS — M954 Acquired deformity of chest and rib: Secondary | ICD-10-CM

## 2020-06-15 DIAGNOSIS — Z8614 Personal history of Methicillin resistant Staphylococcus aureus infection: Secondary | ICD-10-CM

## 2020-06-15 DIAGNOSIS — R053 Chronic cough: Secondary | ICD-10-CM

## 2020-06-15 DIAGNOSIS — U071 Pneumonia due to COVID-19 virus: Secondary | ICD-10-CM

## 2020-06-15 DIAGNOSIS — J189 Pneumonia, unspecified organism: Secondary | ICD-10-CM

## 2020-06-15 DIAGNOSIS — E782 Mixed hyperlipidemia: Secondary | ICD-10-CM

## 2020-06-15 DIAGNOSIS — I87332 Chronic venous hypertension (idiopathic) with ulcer and inflammation of left lower extremity: Secondary | ICD-10-CM

## 2020-06-15 DIAGNOSIS — M961 Postlaminectomy syndrome, not elsewhere classified: Secondary | ICD-10-CM

## 2020-06-15 DIAGNOSIS — K219 Gastro-esophageal reflux disease without esophagitis: Secondary | ICD-10-CM

## 2020-06-15 DIAGNOSIS — J418 Mixed simple and mucopurulent chronic bronchitis: Secondary | ICD-10-CM

## 2020-06-15 DIAGNOSIS — J449 Chronic obstructive pulmonary disease, unspecified: Secondary | ICD-10-CM

## 2020-06-15 DIAGNOSIS — Z8679 Personal history of other diseases of the circulatory system: Secondary | ICD-10-CM

## 2020-06-15 DIAGNOSIS — Z95828 Presence of other vascular implants and grafts: Secondary | ICD-10-CM

## 2020-06-15 DIAGNOSIS — E1165 Type 2 diabetes mellitus with hyperglycemia: Secondary | ICD-10-CM

## 2020-06-15 DIAGNOSIS — G4733 Obstructive sleep apnea (adult) (pediatric): Secondary | ICD-10-CM

## 2020-06-15 DIAGNOSIS — N401 Enlarged prostate with lower urinary tract symptoms: Secondary | ICD-10-CM

## 2020-06-15 DIAGNOSIS — I2510 Atherosclerotic heart disease of native coronary artery without angina pectoris: Secondary | ICD-10-CM

## 2020-06-15 DIAGNOSIS — I5042 Chronic combined systolic (congestive) and diastolic (congestive) heart failure: Secondary | ICD-10-CM

## 2020-06-15 DIAGNOSIS — I429 Cardiomyopathy, unspecified: Secondary | ICD-10-CM

## 2020-06-15 DIAGNOSIS — I4891 Unspecified atrial fibrillation: Secondary | ICD-10-CM

## 2020-06-15 DIAGNOSIS — W19XXXA Unspecified fall, initial encounter: Secondary | ICD-10-CM

## 2020-06-15 LAB — COMPREHENSIVE METABOLIC PANEL
ALBUMIN: 3.8 g/dL — ABNORMAL LOW (ref 3.5–5.0)
ALK PHOSPHATASE: 67 U/L (ref 25–110)
ALT: 9 U/L (ref 7–56)
ANION GAP: 14 K/UL — ABNORMAL HIGH (ref 3–12)
AST: 13 U/L (ref 7–40)
BLD UREA NITROGEN: 37 mg/dL — ABNORMAL HIGH (ref 7–25)
CALCIUM: 7.2 mg/dL — ABNORMAL LOW (ref 8.5–10.6)
CHLORIDE: 102 MMOL/L — ABNORMAL HIGH (ref 98–110)
CO2: 28 MMOL/L (ref 21–30)
CREATININE: 1.4 mg/dL — ABNORMAL HIGH (ref 0.4–1.24)
EGFR: 50 mL/min — ABNORMAL LOW (ref >60)
GLUCOSE,PANEL: 112 mg/dL — ABNORMAL HIGH (ref 70–100)
POTASSIUM: 3.4 MMOL/L — ABNORMAL LOW (ref 3.5–5.1)
SODIUM: 144 MMOL/L — ABNORMAL LOW (ref 137–147)
TOTAL BILIRUBIN: 0.8 mg/dL — ABNORMAL HIGH (ref 0.3–1.2)
TOTAL PROTEIN: 6 g/dL (ref 6.0–8.0)

## 2020-06-15 LAB — CBC AND DIFF
ABSOLUTE BASO COUNT: 0 K/UL (ref 0–0.20)
ABSOLUTE EOS COUNT: 0.1 K/UL (ref 0–0.45)
RBC COUNT: 3.3 M/UL — ABNORMAL LOW (ref 4.4–5.5)
WBC COUNT: 8.8 K/UL (ref 4.5–11.0)

## 2020-06-15 MED ORDER — MISCELLANEOUS MEDICAL SUPPLY MISC MISC
0 refills | 1.00000 days | Status: AC
Start: 2020-06-15 — End: ?

## 2020-06-15 MED ORDER — TRAMADOL 50 MG PO TAB
50 mg | ORAL_TABLET | ORAL | 0 refills | Status: AC | PRN
Start: 2020-06-15 — End: ?

## 2020-06-15 MED ORDER — VANCOMYCIN 1,000 MG IV SOLR
0 refills | 10.00000 days | Status: AC
Start: 2020-06-15 — End: ?

## 2020-06-15 MED ORDER — DICLOFENAC SODIUM 1 % TP GEL
4 g | Freq: Four times a day (QID) | TOPICAL | 1 refills | 25.00000 days | Status: AC
Start: 2020-06-15 — End: ?

## 2020-06-15 NOTE — Progress Notes
History of Present Illness  Ruben Wood. is a 80 y.o. male with a complex medical history is here for follow up.     He has left leg redness and warmth.  He had a fall recently where he fell and he had an open wound just distal to his left knee.  He has a scab overlying it.  He does have some redness and swelling there.  However, in his foot he has even more so.  He had a bruised toenail that is starting to fall off and an open wound on the toe.  He has not noticed any drainage.  He denies any fevers or chills.    He continues have some chest congestion.  We had to treat him recently for an asthma exacerbation.  His breathing has improved, but he continues to have wheezing.  Has been doing his nebulized inhaled corticosteroids as well as long-acting beta agonist regularly.  He does not have evidence of volume overload such as lower extremity swelling.  His CardioMEMS numbers have been in range.  He denies any fevers or chills.    He has been having some leg weakness.  He is been working with physical therapy.  He had a episode recently where he stood up to walk to the bathroom in his house and his legs gave out and he fell down onto the floor.  He was unable to get up on his own.  He did not feel he lightheadedness or dizziness prior to this fall.    His diabetes has been doing fairly well.  He is not had any hypoglycemic episodes.  He is using Lantus 15 units at night as well as empagliflozin.  We tried getting Ozempic, but this was not covered.         Review of Systems  Per HPI    Objective:         ? acetaminophen (TYLENOL) 325 mg tablet Take two tablets by mouth every 6 hours as needed for Pain.   ? albuterol 0.083% (PROVENTIL) 2.5 mg /3 mL (0.083 %) nebulizer solution Inhale 3 mL solution by nebulizer as directed every 6 hours as needed for Wheezing or Shortness of Breath. Indications: asthma   ? albuterol sulfate (PROAIR HFA) 90 mcg/actuation HFA aerosol inhaler Inhale two puffs by mouth into the lungs every 4 hours as needed for Wheezing or Shortness of Breath.   ? allopurinoL (ZYLOPRIM) 300 mg tablet Take one tablet by mouth daily. Take with food.   ? AMITR/GABAPEN/EMU OIL 05/24/08% CREAM (COMPOUND) Apply topically to affected area three times daily as needed.   ? arformoteroL (BROVANA) 15 mcg/2 mL nebulizer solution Inhale 2 mL solution by nebulizer as directed twice daily. Dx: J44.9   ? ASCORBIC ACID-ASCORBATE SODIUM PO Take 500 mg by mouth every morning.   ? aspirin EC 81 mg tablet Take one tablet by mouth daily. Take with food.   ? atorvastatin (LIPITOR) 40 mg tablet Take one tablet by mouth daily.   ? benzonatate (TESSALON PERLES) 100 mg capsule Take one capsule by mouth every 8 hours.   ? budesonide (PULMICORT) 0.25 mg/2 mL nebulizer solution Inhale 2 mL solution by nebulizer as directed twice daily. Dx: J44.9   ? bumetanide (BUMEX) 1 mg tablet Take three tablets by mouth twice daily.   ? cholecalciferol (VITAMIN D-3) 1,000 units tablet Take 1,000 Units by mouth daily.   ? duloxetine DR (CYMBALTA) 60 mg capsule Take one capsule by mouth daily.   ?  empagliflozin (JARDIANCE) 25 mg tablet Take one tablet by mouth daily.   ? finasteride (PROSCAR) 5 mg tablet Take one tablet by mouth daily.   ? fish oil- omega 3-DHA/EPA 300/1,000 mg capsule Take 1 capsule by mouth daily.   ? flash glucose scanning reader (FREESTYLE LIBRE) reader Use as directed.   ? flash glucose sensor (FREESTYLE LIBRE 14 DAY SENSOR) sensor Type 2 diabetes  Indications: type 2 diabetes mellitus   ? gabapentin (NEURONTIN) 100 mg capsule Take four capsules by mouth twice daily.   ? insulin pen needles (disposable) (BD ULTRA-FINE MINI PEN NEEDLE) 31 gauge x 3/16 pen needle Use with insulin pens   ? ipratropium bromide (ATROVENT) 21 mcg (0.03 %) nasal spray Apply two sprays to each nostril as directed every 12 hours.   ? metOLazone (ZAROXOLYN) 2.5 mg tablet Take one tablet by mouth daily as needed. PER OUTPATIENT CARDIOLOGY PROVIDER AS NEEDED FOR FLUID RETENTION   ? Miscellaneous Medical Supply misc Rx: Please wrap legs with ACE bandage from toes as high up to the leg as possible bilaterally  Dx: Lymphedema   ? montelukast (SINGULAIR) 10 mg tablet Take one tablet by mouth at bedtime daily.   ? nitroglycerin (NITROSTAT) 0.4 mg tablet Place 0.4 mg under tongue every 5 minutes as needed for Chest Pain. Max of 3 tablets, call 911.   ? nystatin (MYCOSTATIN) 100,000 units/mL oral suspension Swish and Swallow 5 mL by mouth as directed four times daily.   ? oxyCODONE (ROXICODONE) 5 mg tablet Take one tablet to two tablets by mouth every 8 hours as needed for Pain   ? pantoprazole DR (PROTONIX) 40 mg tablet Take one tablet by mouth twice daily.   ? polyethylene glycol 3350 (MIRALAX) 17 g packet Take two packets by mouth twice daily.   ? rivaroxaban (XARELTO) 20 mg tablet Take one tablet by mouth daily with dinner. Take with food.   ? semaglutide (OZEMPIC) 0.25 mg or 0.5 mg(2 mg/1.5 mL) injection PEN Inject one-quarter mg under the skin every 7 days for 30 days, THEN one-half mg every 7 days for 60 days.   ? senna/docusate (SENOKOT-S) 8.6/50 mg tablet Take one tablet by mouth twice daily.   ? sodium chloride (NEBUSAL) 3 % nebulizer solution Inhale 4 mL by mouth into the lungs twice daily. Dx:  J47.9   ? spironolactone (ALDACTONE) 25 mg tablet Take one tablet by mouth twice daily. Take with food.   ? tamsulosin (FLOMAX) 0.4 mg capsule TAKE (1) CAPSULE BY MOUTH ONCE DAILY.   ? zinc sulfate 220 mg (50 mg elemental zinc) capsule Take 220 mg by mouth daily.     Vitals:    06/15/20 1241   BP: 120/75   Pulse: 109   Resp: 18   SpO2: 99%   PainSc: Zero   Weight: 130.6 kg (288 lb)   Height: 180.3 cm (5' 11)     Body mass index is 40.17 kg/m?[name]     Physical Exam  Vitals and nursing note reviewed.   Constitutional:       General: He is not in acute distress.     Appearance: He is well-developed. He is not diaphoretic.   HENT:      Head: Normocephalic and atraumatic. Neck:      Thyroid: No thyromegaly.      Vascular: No JVD.   Cardiovascular:      Rate and Rhythm: Normal rate and regular rhythm.      Pulses:  Dorsalis pedis pulses are 1+ on the right side and 1+ on the left side.        Posterior tibial pulses are 1+ on the right side and 1+ on the left side.      Heart sounds: Normal heart sounds. No murmur heard.    No friction rub. No gallop.   Pulmonary:      Effort: Pulmonary effort is normal. No respiratory distress.      Breath sounds: Wheezing present. No rales.   Feet:      Right foot:      Protective Sensation: 10 sites tested. 0 sites sensed.      Toenail Condition: Right toenails are abnormally thick.      Left foot:      Protective Sensation: 10 sites tested. 0 sites sensed.      Skin integrity: Skin breakdown, erythema and dry skin present.      Toenail Condition: Left toenails are abnormally thick.   Skin:     Findings: Erythema present.      Comments: Warm, redness, and swelling of left foot extending up to ankle. Second toenail with bruising and loose.    Neurological:      Mental Status: He is alert.         Labwork reviewed:  Lab Results   Component Value Date/Time    HGBA1C 6.7 (H) 04/04/2020 05:40 AM    HGBA1C 5.9 11/25/2019 02:45 PM    HGBA1C 5.9 09/02/2019 09:50 AM    HGBPOC 14.3 04/08/2020 06:58 AM    HGBPOC 11.2 (L) 10/25/2012 10:58 AM    HGBPOC 12.9 (L) 10/25/2012 08:55 AM    MCALBR 7.2 05/18/2020 01:45 PM    TSH 2.49 03/05/2020 08:40 PM    FREET4R 0.99 12/15/2014 09:12 AM    CHOL 161 04/04/2020 05:40 AM    TRIG 152 (H) 04/04/2020 05:40 AM    HDL 53 04/04/2020 05:40 AM    LDL 87 04/04/2020 05:40 AM    NA 135 (L) 05/18/2020 03:09 PM    K 3.5 05/18/2020 03:09 PM    CL 87 (L) 05/18/2020 03:09 PM    CO2 33 (H) 05/18/2020 03:09 PM    GAP 15 (H) 05/18/2020 03:09 PM    BUN 70 (H) 05/18/2020 03:09 PM    CR 2.00 (H) 05/18/2020 03:09 PM    GLU 115 (H) 05/18/2020 03:09 PM    CA 9.5 05/18/2020 03:09 PM    PO4 4.1 03/08/2020 03:51 AM    ALBUMIN 4.0 04/17/2020 05:14 AM    TOTPROT 6.4 04/17/2020 05:14 AM    ALKPHOS 84 04/17/2020 05:14 AM    AST 13 04/17/2020 05:14 AM    ALT 18 04/17/2020 05:14 AM    TOTBILI 0.9 04/17/2020 05:14 AM    GFR 44 01/30/2020 12:00 AM    GFRAA >60 12/03/2019 04:51 AM    PSA 2.96 11/25/2018 12:00 AM              Assessment and Plan:    1. Cellulitis of left lower extremity    2. Bronchiectasis without complication (HCC)    3. Mixed simple and mucopurulent chronic bronchitis (HCC)    4. Fall, initial encounter    5. Type 2 diabetes mellitus with hyperglycemia, without long-term current use of insulin (HCC)      He has cellulitis of the left lower extremity.  He is on doxycycline for suppressive therapy for his bronchiectasis, so I am considering this a failure of oral antibiotic  therapy.  I am setting him up for vancomycin 15 mg/kilogram, (2000 mg) to see every 12 hours IV.  We will coordinate with [facility] so that he can get this done there.  I do want his CBC and CMP today.    As far as his bronchiectasis, this does appear relatively stable currently.  He does have wheezes on exam today.  I want him to continue with the current regimen.  He will continue on the doxycycline suppressive therapy.  He will continue with using his vest.    For his fall, this was a mechanical fall.  He is quite weak and deconditioned.  I want him to continue with physical therapy.    For his diabetes, this is fairly well controlled.  He will continue with the current regimen.  He is followed with our pharmacy team.  Ultimately, would like to see him off of insulin over time, but his frequent steroid burst for his lungs have made that difficult.    Total of 40 minutes were spent on the same day of the visit including preparing to see the patient, obtaining and/or reviewing separately obtained history, performing a medically appropriate examination and/or evaluation, counseling and educating the patient/family/caregiver, ordering medications, tests, or procedures, referring and communication with other health care professionals, documenting clinical information in the electronic or other health record, independently interpreting results and communicating results to the patient/family/caregiver, and care coordination.         There are no Patient Instructions on file for this visit.    No follow-ups on file.

## 2020-06-15 NOTE — Progress Notes
Daily CardioMEMS Readings    Goal PA Diastolic: 14-15 mmHg   PA Diastolic Thresholds: 12-17 mmHg

## 2020-06-15 NOTE — Progress Notes
Date of Service: 06/15/2020    Ruben Wood. Ruben Wood is a 80 y.o. male.       HPI  I had the pleasure of seeing Ruben Wood, who is accompanied by his wife [name] today in [place] [facility] medicine clinic for post hospital follow-up. ?He was hospitalized at [facility] from October 5 through October 13 for acute on chronic heart failure with combined systolic and diastolic dysfunction. ?He has medical history that is significant for coronary artery disease status post PCI with drug-eluting stents in 2019, ischemic cardiomyopathy, heart failure with reduced ejection fraction (35%), CardioMEMS in situ, abdominal aortic aneurysm repaired in 2014, hypertension, CKD, diabetes, OSA, COPD with chronic bronchiectasis, obesity, COVID-19 in October 2020, chronic venous stasis and osteoarthritis. ?He had been seen in heart failure clinic on September 30 by Dr. Sherryll Burger, he had increased weight and lower extremity cellulitis. ?He had follow-up appointment on 10/5 with worsening hypervolemia and cellulitis and was admitted to the hospital. ?He was diuresed with IV Bumex and his CardioMEMS readings were monitored daily. ?There was concerned that the CardioMEMS reading may not be accurate therefore he underwent a right heart cath on December 02, 2019 that did not need any recalibration as the PA diastolic was congruent with the right heart cath PA diastolic. ?His new CardioMEMS target is 15 with thresholds 12-17. ?His Jardiance was uptitrated to 25 mg. ?He was seen by infectious disease for bilateral lower extremity cellulitis and was treated with IV vancomycin and ceftriaxone, on day of discharge his white blood cell count was 7.1 with T-max 98.8. ?He was scheduled for a follow-up appointment with Dr. Adella Hare today. ?He underwent lower extremity venous Doppler that was negative for DVT, lower extremity arterial Doppler was abnormal, vascular physician was consulted for further recommendation. ?They recommended patient wear support stockings as tolerated. ?Pulmonary was consulted for acute asthma exacerbation, he was started on a steroid taper, inhaler, nebulizer treatments, Singulair and aerobika. ?He was discharged on prednisone 20 mg daily until 10/17 then reduce to 10 mg daily for 5 days. ?He was given a flu vaccination on day of discharge and was recommended to get his third Covid vaccination 4 weeks from then. ?He did have an iron panel on 10/5 that showed iron deficiency however with recent treatment of cellulitis deferred IV iron to an outpatient clinic assessment.  ?  Today he reports that his breathing is markedly improved since he was diuresed, we discussed that he had diuresed 15 L.  He reports he has more energy and is able to do more.  He is pleased that his cellulitis on his hand has improved.  His CardioMEMS PA diastolic today is 13, within range.  He has occasional episodes of lightheadedness, his wife attributes this to changes in blood glucose levels.  They do attribute the weight gain to eating out more frequently since being on the road.  He had been taking spironolactone 25 mg twice daily instead of daily, however he is taking Bumex dose as prescribed.  He does do home vest therapy and inhaled saline treatments.  ?    Vitals:    06/15/20 1410   BP: 109/50   BP Source: Arm, Right Upper   Pulse: 50   SpO2: 96%   O2 Device: Nasal cannula   O2 Liter Flow: 4 Lpm   PainSc: Zero   Weight: 130.7 kg (288 lb 3.2 oz)  Comment: shoes off   Height: 180.3 cm (5' 11)     Body mass  index is 40.2 kg/m?[name]     Past Medical History  Patient Active Problem List    Diagnosis Date Noted   ? Complex care coordination 05/04/2017     Priority: High     Class: Acute   ? Epistaxis 05/14/2020     Was a problem after starting anticoagulation 04/2020, but this has been an ongoing issue for him. On O2 via NC at baseline.         ? Gout 04/03/2020   ? Dyspnea 04/02/2020   ? 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 Chronic bronchitis (HCC) 11/25/2019   ? Chronic bronchitis with acute exacerbation (HCC) 12/05/2018   ? 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 Type 2 diabetes mellitus with hyperglycemia, without long-term current use of insulin (HCC) 03/12/2018   ? Atrial fibrillation (HCC) 03/12/2018   ? Chronic respiratory failure with hypercapnia (HCC) 11/16/2017   ? Depression 11/15/2017   ? OAB (overactive bladder)      Urinary frequency, urgency, urge urinary incontinence (UUI), nocturia, nocturnal enuresis.  -- trial Mirabegron 25 mg --> improved, but persistent sx's.  -- trial Mirabegron 50 mg.     ? Urinary incontinence, urge      See OAB A&P note.     ? Coronary artery disease due to lipid rich plaque 06/29/2017   ? Bronchiectasis with acute lower respiratory infection (HCC) 05/10/2017   ? 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     ? Benign prostatic hyperplasia (BPH) 03/01/2017     TRUS Prostate (03/01/2017): Prostate volume = 38.3 mL.  D/c'd Tamsulosin d/t lack of symptom improvement.     ? Enrolled in chronic care management 02/28/2017   ? History of elevated prostate specific antigen (PSA)      PNBx (03/01/2017): (L) high-grade prostatic intraepithelial neoplasia (HG PIN), 1/6 cores; [name], PA-C.     ? Spondylolisthesis, lumbar region 12/13/2016   ? Lumbar post-laminectomy syndrome      L4-5     ? Spinal stenosis of lumbosacral region 09/20/2016   ? Spondylolisthesis of lumbosacral region 09/20/2016   ? Osteoarthritis of spine with radiculopathy, lumbosacral region 09/20/2016   ? 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 He had a normal response to pneumococca vaccination; he had 17/23 positive serotypes. He has normal tetanus toxoid Ab and CH50.  His T&B cell panel revealed a mildly low B-cell count at 68 right after a time of acute illness.  He had positive diphtheria antibody.    Likely multifactorial with significant comorbidities including frequent steroid use for bronchiectasis exacerbations, frequent infections, and possibly a nutritional component (low total protein).    Previously on doxycycline once daily for recurrent infections, which he initially felt had been tremendously helpful, but he has had a couple episodes of what may be pneumonia in early 2022 when he was off.     Plan:  - Restart doxycycline 100 mg daily.  - Recommend getting the Pneumovax immunization next time he is in clinic in person and will need repeat pneumococcal antibody levels 1 month afterwards once he has had resolution of his COVID issues.          ? Moderate persistent asthma with acute exacerbation 02/25/2016  He has had asthma since sometime in his 20-30s. He gets cough, chest tightness, wheezing, shortness of breath that is year round with worsening in the Spring and Fall.   Triggers: URI, hot/cold air. Additionally he has been diagnosed with bronchiectasis and emphysema; his PFTs suggest both obstructive and restrictive disease and asthma is unlikely to be the sole cause of his dyspnea.    He has negative IgE immunocaps to aeroallergens which makes the possibility of allergic asthma highly unlikely. His IgE level was 17 and he had negative ANCAs, MPO and PR3.     Repeatedly being treated with steroids now for flares.  Also on hypertonic saline nebs, DuoNebs, budesnoide/Brovana via neb, and Singulair as well as aggressive pulmonary clearance with vest and Aerobika.        ? 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 He is not having issues with urticaria.    - We recommend IgE immunocaps to aeroallergens.      ? Recurrent infections 02/25/2016     Recurrent sinopulmonary infections requiring antibiotics 5-10 times per year.   Cellulitis and non-healing wounds.   Many underlying illnesses predisposing him to these infections in addition to intermittent hypogammaglobulinemia associated with active infections and systemic steroids.       ? S/P left pulmonary artery pressure sensor implant placement (CardioMEMs)  02/02/2016     * Patient has CardioMEMs (Pulmonary Artery Pressure Sensor)* Please call Matthew Folks, CardioMEMs Program Coordinator [phone] or page Heart Failure Rounding Team if patient is admitted or presents to ED*  03/30/15 implant     ? Ischemic cardiomyopathy 01/12/2016   ? ICD (implantable cardioverter-defibrillator), biventricular, in situ 12/07/2015   ? Hypercholesterolemia 11/25/2015   ? 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 Bronchiectasis without complication (HCC) 06/18/2015     Non-sputum producer.  Currently on Symbicort.  - Dr. [name] was able to get him a vest to wear at home for secretions and this has helped immensely with his secretions and breathing.      ? Cellulitis of left lower extremity 11/12/2014     10/2014: admitted with cellulitis, [place] negative for venous clot, MRI without osteomyelitis.      11/12/2014 In clinic today, still with cellulitis.  Per patient and his wife, the erythema may be extending.  Cultures from drainage in hospital with MSSA, but Blood Cx negative.  No e/o osteomyelitis.  My concern is that this antibiotic regimen is not adequately treating his cellulitis.  We will broaden coverage to get MRSA with doxycycline.  Patient and wife given strict call/return criteria.  I will see patient in 3-4 days for re-evaluation.  No fever today.   Plan:  Stop cefpodoxime and start doxycylcine    11/17/2014 Much better, no warmth and erythema minimal.    Plan: continue doxycyline for full 10 day course.        ? Osteoarthritis of spine with radiculopathy, lumbar region 07/28/2014   ? Acute on chronic combined systolic and diastolic congestive heart failure, NYHA class 2 (HCC) 07/19/2014     11/04/14: EF 20%, severely dilated LV with grade 1 diastolic dysfunction.  11/12/2014 In clinic  today, patient appears fairly well compensated.  He does have some LEE, but no significant rales (just some at bases), no JVD sitting upright, and no sx to suggest decompensation.  We will continue metoprolol, spironolactone, and bumex at current doses.    11/17/2014 Edema much improved in lower ext, Cr trending down on last labs.  Will recheck labs today.  Advised to weigh himself daily.  Rechecking BMP today to ensure not overdoing diuretics.     On metoprolol, spironolactone.  Will need to explore allergy to ARB more as patient may benefit from ACEI.      03/30/15 CardioMEMS implant by Dr. Chales Abrahams  1. Normal cardiac output and cardiac index.  2. Normal pulmonary pressures and normal pulmonary capillary wedge pressure.  3. Successful insertion of CardioMEMS pulmonary artery pressure sensor     ? 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      ? Stage 3 chronic kidney disease (HCC) 08/26/2013   ? Mixed dyslipidemia 08/26/2013     Hepatic Function    Lab Results   Component Value Date/Time    ALBUMIN 4.0 11/04/2014 12:26 AM    TOTAL PROTEIN 6.9 11/04/2014 12:26 AM    ALK PHOSPHATASE 61 11/04/2014 12:26 AM    Lab Results Component Value Date/Time    AST (SGOT) 41* 11/04/2014 12:26 AM    ALT (SGPT) 19 11/04/2014 12:26 AM    TOTAL BILIRUBIN 1.4* 11/04/2014 12:26 AM        Lab Results   Component Value Date    CHOL 148 12/15/2013    TRIG 122 12/15/2013    HDL 51 12/15/2013    LDL 88 12/15/2013    VLDL 24 12/15/2013    NONHDLCHOL 97 12/15/2013       BP Readings from Last 3 Encounters:   11/12/14 129/80   11/09/14 111/44   11/04/14 146/65     Plan:   80 y.o. with known CAD with CTO of RCA and obtuse marginal branch with high grade stenosis (90%) in mid left circ.    Advised heart healthy diet and daily exercise.    Continue high intensity statin, atorvastatin       ? AAA (abdominal aortic aneurysm) (HCC) 10/15/2012     Duplex done at OSH in 2007 and 2008 ~ 4.1 cm    Duplex 10/15/12-AAA 7.3 cm AP x 7.3 cm       ? History of MRSA infection 10/14/2012   ? CAD (coronary artery disease), native coronary artery 08/15/2012     07/11/2002- Cath @ [facility] showed Severe double vessel disease(OMB of circumflex and distal circ)  inferior-basilar dyskinesis.                  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    09/12/12   Cath - [facility]:  CTO of the right coronary artery, CTO of the obtuse marginal branch and high-grade stenosis of   90% in the  mid left circumflex artery No significant stenosis in the left anterior descending artery or left main vessel     Failed attempt in opening 2nd obtuse marginal CTO as described above.    10/14/12: Unsuccessful attempt to open CTO of OMB and mid-CFX by Dr. Mackey Birchwood      04/02/17: Cath by Dr. Steward Ros:   4. Chronic total occlusion of the circumflex.  5. Chronic total occlusion of the right coronary artery, status post percutaneous coronary intervention with drug-eluting stent times 2.  6. Small distal right coronary artery perforation without echo or hemodynamic evidence of compromise.    06/29/17-cardiac catheterization by Dr. Steward Ros. One DES placed to chronic total occlusion of the circumflex artery. The RCA stent was patent.     ? OSA on CPAP 08/13/2012     CPAP at  DME: [facility]    Split night:  08/14/2017  AHI 43.2  REM n/a  Time below 88 %: 98 mins  Lowest sat: 83%     CPAP 10 cm h20 effective      ? COPD (chronic obstructive pulmonary disease) (HCC) 08/13/2012     04/28/13: PFTs with more restrictive pattern with FEV1 50%, FEV1/FVC 89%, DLCO 65%.    12/27/13: PFTs with FEV1 82%, FEV1/FVC 113%, and DLCO 77%     ? 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  Discussed the importance of diet.  Exercise as tolerated.        ? Essential hypertension 08/13/2012   ? Chest wall deformity 07/09/2012     Chest wall reconstruction on 07/09/12  Lung herniation- bio bridge           Review of Systems   Constitutional: Negative.   HENT: Negative.    Eyes: Negative.    Cardiovascular: Negative.    Respiratory: Negative.    Endocrine: Negative.    Hematologic/Lymphatic: Negative.    Skin: Negative.    Gastrointestinal: Negative.    Genitourinary: Negative.    Neurological: Negative.    Psychiatric/Behavioral: Negative.    Allergic/Immunologic: Negative.        Physical Exam  General Appearance: resting comfortably, no acute distress  Skin: warm, moist, no evident rashes   Digits and Nails: no clubbing   Eyes: conjunctivae and lids normal, pupils are equal and round   Lips & Oral Mucosa: no pallor or cyanosis   Ear, Nose, Throat: No deformities   Neck veins: neck veins are flat, neck veins are not distended   Carotid Arteries: normal carotid upstroke bilaterally, no bruits   Chest Inspection: chest is normal in appearance   Lung Auscultation: lungs with normal breath sounds, no rhonchi, and and expiratory wheezes  PMI: PMI not enlarged or displaced   Cardiac Rhythm: regular rhythm and normal rate   Cardiac Auscultation: Normal S1 & S2, no S3 or S4, no rubs or clicks   Murmurs: no significant cardiac murmur appreciated   Abdominal Exam: soft, non-tender, no masses, bowel sounds normal   Pedal Pulses: normal symmetric pedal pulses   Lower Extremities: no lower extremity edema   Muscle Strength: grossly normal   Neurologic Exam: neurological assessment grossly intact   Orientation: oriented to time, place and person   Affect &  Mood: appropriate and sustained affect    Cardiovascular Studies      Cardiovascular Health Factors  Vitals BP Readings from Last 3 Encounters:   06/15/20 109/50   06/15/20 120/75   06/01/20 119/62     Wt Readings from Last 3 Encounters:   06/15/20 130.7 kg (288 lb 3.2 oz)   06/15/20 130.6 kg (288 lb)   06/01/20 130.2 kg (287 lb)     BMI Readings from Last 3 Encounters:   06/15/20 40.20 kg/m?   06/15/20 40.17 kg/m?   06/01/20 40.03 kg/m?      Smoking Social History     Tobacco Use   Smoking Status Former Smoker   ? Packs/day: 2.00   ? Years: 40.00   ? Pack years: 80.00   ? Types: Cigarettes   ? Quit date: 07/09/1998   ? Years since quitting: 21.9   Smokeless Tobacco Never Used      Lipid Profile Cholesterol   Date Value Ref Range Status   04/04/2020 161 <200 MG/DL Final     HDL   Date Value Ref Range Status   04/04/2020 53 >40 MG/DL Final     LDL   Date Value Ref Range Status   04/04/2020 87 <100 mg/dL Final     Triglycerides   Date Value Ref Range Status   04/04/2020 152 (H) <150 MG/DL Final      Blood Sugar Hemoglobin A1C   Date Value Ref Range Status   04/04/2020 6.7 (H) 4.0 - 6.0 % Final     Comment:     The ADA recommends that most patients with type 1 and type 2 diabetes maintain   an A1c level <7%.       Glucose   Date Value Ref Range Status   05/18/2020 115 (H) 70 - 100 MG/DL Final 16/11/9602 540 (H) 70 - 100 MG/DL Final   98/12/9145 829 (H) 70 - 100 MG/DL Final     Glucose Fasting   Date Value Ref Range Status   12/15/2014 111  Final     Glucose, POC   Date Value Ref Range Status   04/26/2020 172 (H) 70 - 100 MG/DL Final   56/21/3086 578 (H) 70 - 100 MG/DL Final   46/96/2952 841 (H) 70 - 100 MG/DL Final          Problems Addressed Today  No diagnosis found.    Assessment and Plan  Chronic?systolic and diastolic?HFrEF, ?EF: 35%.  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  Volume is optimal.  CardioMEMS at target with PA diastolic pressure 14 mmHg  Will not make any changes in his diuretics.  Continue current medications  Continue working with physical therapy and pulmonary rehab.  Will need to follow with Romelle Starcher in 3 to 6 weeks    60 minutes of time spent with patient and family.  Greater than 30 minutes of this time spent counseling regarding heart failure with reduced ejection fraction, medications and volume control.  Vanetta Shawl M.D  [facility] Failure [facility] Transplant Cardiologist    Current Medications (including today's revisions)  ? acetaminophen (TYLENOL) 325 mg tablet Take two tablets by mouth every 6 hours as needed for Pain.   ? albuterol 0.083% (  PROVENTIL) 2.5 mg /3 mL (0.083 %) nebulizer solution Inhale 3 mL solution by nebulizer as directed every 6 hours as needed for Wheezing or Shortness of Breath. Indications: asthma   ? albuterol sulfate (PROAIR HFA) 90 mcg/actuation HFA aerosol inhaler Inhale two puffs by mouth into the lungs every 4 hours as needed for Wheezing or Shortness of Breath.   ? allopurinoL (ZYLOPRIM) 300 mg tablet Take one tablet by mouth daily. Take with food.   ? AMITR/GABAPEN/EMU OIL 05/24/08% CREAM (COMPOUND) Apply topically to affected area three times daily as needed.   ? arformoteroL (BROVANA) 15 mcg/2 mL nebulizer solution Inhale 2 mL solution by nebulizer as directed twice daily. Dx: J44.9   ? ASCORBIC ACID-ASCORBATE SODIUM PO Take 500 mg by mouth every morning.   ? aspirin EC 81 mg tablet Take one tablet by mouth daily. Take with food.   ? atorvastatin (LIPITOR) 40 mg tablet Take one tablet by mouth daily.   ? benzonatate (TESSALON PERLES) 100 mg capsule Take one capsule by mouth every 8 hours.   ? budesonide (PULMICORT) 0.25 mg/2 mL nebulizer solution Inhale 2 mL solution by nebulizer as directed twice daily. Dx: J44.9   ? bumetanide (BUMEX) 1 mg tablet Take three tablets by mouth twice daily.   ? cholecalciferol (VITAMIN D-3) 1,000 units tablet Take 1,000 Units by mouth daily.   ? diclofenac sodium (VOLTAREN) 1 % topical gel Apply four g topically to affected area four times daily.   ? duloxetine DR (CYMBALTA) 60 mg capsule Take one capsule by mouth daily.   ? empagliflozin (JARDIANCE) 25 mg tablet Take one tablet by mouth daily.   ? finasteride (PROSCAR) 5 mg tablet Take one tablet by mouth daily.   ? fish oil- omega 3-DHA/EPA 300/1,000 mg capsule Take 1 capsule by mouth daily.   ? flash glucose scanning reader (FREESTYLE LIBRE) reader Use as directed.   ? flash glucose sensor (FREESTYLE LIBRE 14 DAY SENSOR) sensor Type 2 diabetes  Indications: type 2 diabetes mellitus   ? gabapentin (NEURONTIN) 100 mg capsule Take four capsules by mouth twice daily.   ? insulin pen needles (disposable) (BD ULTRA-FINE MINI PEN NEEDLE) 31 gauge x 3/16 pen needle Use with insulin pens   ? ipratropium bromide (ATROVENT) 21 mcg (0.03 %) nasal spray Apply two sprays to each nostril as directed every 12 hours.   ? metOLazone (ZAROXOLYN) 2.5 mg tablet Take one tablet by mouth daily as needed. PER OUTPATIENT CARDIOLOGY PROVIDER AS NEEDED FOR FLUID RETENTION   ? Miscellaneous Medical Supply misc Rx: Please wrap legs with ACE bandage from toes as high up to the leg as possible bilaterally  Dx: Lymphedema   ? montelukast (SINGULAIR) 10 mg tablet Take one tablet by mouth at bedtime daily.   ? nitroglycerin (NITROSTAT) 0.4 mg tablet Place 0.4 mg under tongue every 5 minutes as needed for Chest Pain. Max of 3 tablets, call 911.   ? nystatin (MYCOSTATIN) 100,000 units/mL oral suspension Swish and Swallow 5 mL by mouth as directed four times daily.   ? oxyCODONE (ROXICODONE) 5 mg tablet Take one tablet to two tablets by mouth every 8 hours as needed for Pain   ? pantoprazole DR (PROTONIX) 40 mg tablet Take one tablet by mouth twice daily.   ? polyethylene glycol 3350 (MIRALAX) 17 g packet Take two packets by mouth twice daily.   ? rivaroxaban (XARELTO) 20 mg tablet Take one tablet by mouth daily with dinner. Take with food.   ?  semaglutide (OZEMPIC) 0.25 mg or 0.5 mg(2 mg/1.5 mL) injection PEN Inject one-quarter mg under the skin every 7 days for 30 days, THEN one-half mg every 7 days for 60 days.   ? senna/docusate (SENOKOT-S) 8.6/50 mg tablet Take one tablet by mouth twice daily.   ? sodium chloride (NEBUSAL) 3 % nebulizer solution Inhale 4 mL by mouth into the lungs twice daily. Dx:  J47.9   ? spironolactone (ALDACTONE) 25 mg tablet Take one tablet by mouth twice daily. Take with food.   ? tamsulosin (FLOMAX) 0.4 mg capsule TAKE (1) CAPSULE BY MOUTH ONCE DAILY.   ? traMADoL (ULTRAM) 50 mg tablet Take one tablet by mouth every 8 hours as needed for Pain.   ? zinc sulfate 220 mg (50 mg elemental zinc) capsule Take 220 mg by mouth daily.

## 2020-06-15 NOTE — Telephone Encounter
I called spoke with patient's wife to let her know that I called LVM for [facility] and was told by nurse at nurses station she was out today.    I was advised to fax orders to them at [phone] attention Roni    Orders faxed with demographic sheet.  Conformation of fax transmission received.    I will send my chart message with faxed information to patient    Etta Grandchild, RN

## 2020-06-16 ENCOUNTER — Encounter: Admit: 2020-06-16 | Discharge: 2020-06-16 | Payer: MEDICARE

## 2020-06-16 DIAGNOSIS — L03116 Cellulitis of left lower limb: Secondary | ICD-10-CM

## 2020-06-16 MED ORDER — MISCELLANEOUS MEDICAL SUPPLY MISC MISC
0 refills
Start: 2020-06-16 — End: ?

## 2020-06-16 NOTE — Telephone Encounter
Could they do 2000mg  q 36-48 hours without putting him in the hospital?  We could then keep an eye on his vanc level and allow him to go home after the 4 hour infusion.

## 2020-06-16 NOTE — Telephone Encounter
I called back LVM with verbal orders for IV vancomycin 2,000 mg every 36-48 hours x 4 doses.  Instructions to follow/monitor vancomycin levels through lab work per their protocol.      Advised Dr. Crecencio Mc would return to clinic tomorrow to sign order to be faxed back.    Etta Grandchild, RN

## 2020-06-16 NOTE — Progress Notes
Pt recently seen in HF clinic. Wanted to speak to patient relations. Called and left message for patient relations to call them back.     Ruben Wood Cvm Nurse Hf Team [place]  Pt seen by DM, NP today; pt/spouse have concerns about recent hospitalization.   Pt spouse, Ruben Wood would like to review with someone, she wants to fill out a pt survey, never received and also discuss concerns. I suggested pt relations and she thought that would be a good route.

## 2020-06-16 NOTE — Telephone Encounter
Dr [facility] with the infusion clinic at [facility] called stating that with the way the orders are written now patient will need to be admitted because each dose needs to infuse over 4 hours and at twice a day would require admission.    States she's concerned about the dose order and wants to clarify that this is what is wanted.  Stated that based on patients creatine clearance he should only be receiving 2,000 mg in 36 hours.    Patient prefers once daily dosing so he wouldn't have to go into the hospital.    Routing to Dr. Crecencio Mc to advise

## 2020-06-16 NOTE — Telephone Encounter
I called patient's wife to make sure this has been arranged.  She states he is scheduled to start tonight at 8:00 pm     Etta Grandchild, RN

## 2020-06-16 NOTE — Telephone Encounter
I've pended verbal order that was called in to be approved by Dr. Crecencio Mc and then printed, signed and faxed.    Routing to Dr. Crecencio Mc and [place] as this will need to be done tomorrow.

## 2020-06-16 NOTE — Telephone Encounter
I called Ruben Wood left voice message with Dr. Crecencio Mc recommendations requesting they take verbal orders as Dr. Crecencio Mc is no longer in the clinic to sign orders.    Provided my contact information for a return call.  Advised Dr. Crecencio Mc wanted to follow their protocol while avoiding patient being admitted to hospital.    Additional Instructions from Dr. Crecencio Mc: 7 days, so 4 doses since we cant really do 7 if he gets it every 36-48 hours

## 2020-06-16 NOTE — Telephone Encounter
[facility] called back stating she can get this started for patient with the verbal orders, but will need written signed orders faxed back to them within 24 hours faxed to [phone].    Routing to Dr. Crecencio Mc to place orders for new dosage, request for vanc. Levels, and how many doses patient is to receive.    Orders will need a signature and  To be faxed.  Etta Grandchild, RN

## 2020-06-18 ENCOUNTER — Ambulatory Visit: Admit: 2020-06-18 | Discharge: 2020-06-19 | Payer: MEDICARE

## 2020-06-18 ENCOUNTER — Encounter: Admit: 2020-06-18 | Discharge: 2020-06-18 | Payer: MEDICARE

## 2020-06-18 DIAGNOSIS — D801 Nonfamilial hypogammaglobulinemia: Secondary | ICD-10-CM

## 2020-06-18 DIAGNOSIS — L03116 Cellulitis of left lower limb: Secondary | ICD-10-CM

## 2020-06-18 DIAGNOSIS — J9612 Chronic respiratory failure with hypercapnia: Secondary | ICD-10-CM

## 2020-06-18 DIAGNOSIS — R1114 Bilious vomiting: Secondary | ICD-10-CM

## 2020-06-18 DIAGNOSIS — R110 Nausea: Secondary | ICD-10-CM

## 2020-06-18 LAB — VANCOMYCIN TROUGH: VANCOMYCIN TROUGH: 6.9 (ref 5.0–15)

## 2020-06-18 MED ORDER — PREDNISONE 10 MG PO TAB
ORAL_TABLET | Freq: Every day | ORAL | 0 refills | Status: AC
Start: 2020-06-18 — End: ?

## 2020-06-18 NOTE — Progress Notes
History of Present Illness  Ruben Wood. is a 80 y.o. male is here for follow up on cellulitis.     I saw Ruben Wood a couple of days ago and he was having cellulitis of the left foot.  He was on doxycycline anyway for suppressive antibiotic therapy, so I consider this a failure of oral antibiotics.  We set him up for IV vancomycin which she is getting done locally at [facility].  He is received 2 doses.  The redness is already starting to improve.  He does not have any fevers or chills.    He does have a new problem today that he wanted to discuss including intermittent nausea and vomiting.  This is happened for the past several months will have episodes, sometimes which can last for up to a day or more, where he has severe nausea and bilious vomiting.  He has no hematemesis or coffee-ground emesis.  He has not found any pattern.  Oftentimes he will have nausea after eating, but he does not have the severe nausea and vomiting episodes he is describing today.  I have prescribed Ozempic, but he is never taken it.  He does not have any abdominal pain.  He has not noticed any association with different medications.    He has severe asthma which is often triggered especially this time a year from allergic triggers.  He just had a recent exacerbation which responded well to prednisone burst, but his symptoms are coming right back now.  He is having severe cough increased shortness of breath.  He denies any chest pain.  Denies any lower extremity swelling.       Review of Systems  Per HPI    Objective:         ? acetaminophen (TYLENOL) 325 mg tablet Take two tablets by mouth every 6 hours as needed for Pain.   ? albuterol 0.083% (PROVENTIL) 2.5 mg /3 mL (0.083 %) nebulizer solution Inhale 3 mL solution by nebulizer as directed every 6 hours as needed for Wheezing or Shortness of Breath. Indications: asthma   ? albuterol sulfate (PROAIR HFA) 90 mcg/actuation HFA aerosol inhaler Inhale two puffs by mouth into the lungs every 4 hours as needed for Wheezing or Shortness of Breath.   ? allopurinoL (ZYLOPRIM) 300 mg tablet Take one tablet by mouth daily. Take with food.   ? AMITR/GABAPEN/EMU OIL 05/24/08% CREAM (COMPOUND) Apply topically to affected area three times daily as needed.   ? arformoteroL (BROVANA) 15 mcg/2 mL nebulizer solution Inhale 2 mL solution by nebulizer as directed twice daily. Dx: J44.9   ? ASCORBIC ACID-ASCORBATE SODIUM PO Take 500 mg by mouth every morning.   ? aspirin EC 81 mg tablet Take one tablet by mouth daily. Take with food.   ? atorvastatin (LIPITOR) 40 mg tablet Take one tablet by mouth daily.   ? benzonatate (TESSALON PERLES) 100 mg capsule Take one capsule by mouth every 8 hours.   ? budesonide (PULMICORT) 0.25 mg/2 mL nebulizer solution Inhale 2 mL solution by nebulizer as directed twice daily. Dx: J44.9   ? bumetanide (BUMEX) 1 mg tablet Take three tablets by mouth twice daily.   ? cholecalciferol (VITAMIN D-3) 1,000 units tablet Take 1,000 Units by mouth daily.   ? diclofenac sodium (VOLTAREN) 1 % topical gel Apply four g topically to affected area four times daily.   ? duloxetine DR (CYMBALTA) 60 mg capsule Take one capsule by mouth daily.   ?  empagliflozin (JARDIANCE) 25 mg tablet Take one tablet by mouth daily.   ? finasteride (PROSCAR) 5 mg tablet Take one tablet by mouth daily.   ? fish oil- omega 3-DHA/EPA 300/1,000 mg capsule Take 1 capsule by mouth daily.   ? flash glucose scanning reader (FREESTYLE LIBRE) reader Use as directed.   ? flash glucose sensor (FREESTYLE LIBRE 14 DAY SENSOR) sensor Type 2 diabetes  Indications: type 2 diabetes mellitus   ? gabapentin (NEURONTIN) 100 mg capsule Take four capsules by mouth twice daily.   ? insulin pen needles (disposable) (BD ULTRA-FINE MINI PEN NEEDLE) 31 gauge x 3/16 pen needle Use with insulin pens   ? ipratropium bromide (ATROVENT) 21 mcg (0.03 %) nasal spray Apply two sprays to each nostril as directed every 12 hours.   ? metOLazone (ZAROXOLYN) 2.5 mg tablet Take one tablet by mouth daily as needed. PER OUTPATIENT CARDIOLOGY PROVIDER AS NEEDED FOR FLUID RETENTION   ? Miscellaneous Medical Supply misc Vancomycin 2000 mg IV every 36 to 48hours x 4 doses, please draw vancomycin level per your protocol at [facility].   ? Miscellaneous Medical Supply misc Rx: Please wrap legs with ACE bandage from toes as high up to the leg as possible bilaterally  Dx: Lymphedema   ? montelukast (SINGULAIR) 10 mg tablet Take one tablet by mouth at bedtime daily.   ? nitroglycerin (NITROSTAT) 0.4 mg tablet Place 0.4 mg under tongue every 5 minutes as needed for Chest Pain. Max of 3 tablets, call 911.   ? nystatin (MYCOSTATIN) 100,000 units/mL oral suspension Swish and Swallow 5 mL by mouth as directed four times daily.   ? oxyCODONE (ROXICODONE) 5 mg tablet Take one tablet to two tablets by mouth every 8 hours as needed for Pain   ? pantoprazole DR (PROTONIX) 40 mg tablet Take one tablet by mouth twice daily.   ? polyethylene glycol 3350 (MIRALAX) 17 g packet Take two packets by mouth twice daily.   ? predniSONE (DELTASONE) 10 mg tablet Take four tablets by mouth daily for 5 days, THEN two tablets daily for 5 days, THEN one tablet daily for 5 days, THEN one-half tablet daily for 5 days.   ? rivaroxaban (XARELTO) 20 mg tablet Take one tablet by mouth daily with dinner. Take with food.   ? senna/docusate (SENOKOT-S) 8.6/50 mg tablet Take one tablet by mouth twice daily.   ? sodium chloride (NEBUSAL) 3 % nebulizer solution Inhale 4 mL by mouth into the lungs twice daily. Dx:  J47.9   ? spironolactone (ALDACTONE) 25 mg tablet Take one tablet by mouth twice daily. Take with food.   ? tamsulosin (FLOMAX) 0.4 mg capsule TAKE (1) CAPSULE BY MOUTH ONCE DAILY.   ? traMADoL (ULTRAM) 50 mg tablet Take one tablet by mouth every 8 hours as needed for Pain.   ? vancomycin (VANCOCIN) 1000 mg/20 mL injection 15mg /kg (2000mg ) q 12 hours x 7 days, please draw vanc level after 3rd dose and adjust per protocol   ? zinc sulfate 220 mg (50 mg elemental zinc) capsule Take 220 mg by mouth daily.     There were no vitals filed for this visit.    There is no height or weight on file to calculate BMI.     Physical Exam  Constitutional:       Appearance: Normal appearance.   HENT:      Head: Normocephalic and atraumatic.   Pulmonary:      Effort: Pulmonary effort is normal.  Comments: Coughing throughout visit  Abdominal:      Comments: Active emesis during visit today   Skin:     Findings: Erythema present.   Neurological:      General: No focal deficit present.      Mental Status: He is alert.   Psychiatric:         Mood and Affect: Mood normal.         Labwork reviewed:  Lab Results   Component Value Date/Time    HGBA1C 6.7 (H) 04/04/2020 05:40 AM    HGBA1C 5.9 11/25/2019 02:45 PM    HGBA1C 5.9 09/02/2019 09:50 AM    HGBPOC 14.3 04/08/2020 06:58 AM    HGBPOC 11.2 (L) 10/25/2012 10:58 AM    HGBPOC 12.9 (L) 10/25/2012 08:55 AM    MCALBR 7.2 05/18/2020 01:45 PM    TSH 2.49 03/05/2020 08:40 PM    FREET4R 0.99 12/15/2014 09:12 AM    CHOL 161 04/04/2020 05:40 AM    TRIG 152 (H) 04/04/2020 05:40 AM    HDL 53 04/04/2020 05:40 AM    LDL 87 04/04/2020 05:40 AM    NA 144 06/15/2020 02:50 PM    K 3.4 (L) 06/15/2020 02:50 PM    CL 102 06/15/2020 02:50 PM    CO2 28 06/15/2020 02:50 PM    GAP 14 (H) 06/15/2020 02:50 PM    BUN 37 (H) 06/15/2020 02:50 PM    CR 1.42 (H) 06/15/2020 02:50 PM    GLU 112 (H) 06/15/2020 02:50 PM    CA 7.2 (L) 06/15/2020 02:50 PM    PO4 4.1 03/08/2020 03:51 AM    ALBUMIN 3.8 06/15/2020 02:50 PM    TOTPROT 6.0 06/15/2020 02:50 PM    ALKPHOS 67 06/15/2020 02:50 PM    AST 13 06/15/2020 02:50 PM    ALT 9 06/15/2020 02:50 PM    TOTBILI 0.8 06/15/2020 02:50 PM    GFR 44 01/30/2020 12:00 AM    GFRAA >60 12/03/2019 04:51 AM    PSA 2.96 11/25/2018 12:00 AM         Assessment and Plan:    1. Cellulitis of left lower extremity    2. Bilious vomiting with nausea    3. Chronic respiratory failure with hypercapnia (HCC)    4. Moderate persistent asthma with acute exacerbation    5. Hypogammaglobulinemia (HCC)    6. Chronic nausea      This was scheduled as a follow-up for cellulitis.  This seems to be improving.  He is now on IV vancomycin which she is getting done through [facility].  We will plan for 7-day antibiotic course.  I would like to reassess him on day 7 to ensure that we do not need further antibiotics.    He has new problem today of chronic nausea and vomiting of unclear etiology.  This is coming on intermittently without clear pattern.  I would like to start with a gastric emptying study.  Given the severity of this, I am going refer to GI for further evaluation.      He is having another asthma exacerbation today.  We will treat him with a prednisone taper of prednisone 40 mg daily x5 days, 20 mg daily x5 days, 10 mg daily x5 days, and then 5 mg daily x5 days.  He will continue with his oxygen on his current dose.  We will need to be diligent of infectious complications.  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    There are no Patient Instructions on file for this visit.    Return in 4 days (on 06/22/2020).

## 2020-06-19 DIAGNOSIS — J4541 Moderate persistent asthma with (acute) exacerbation: Secondary | ICD-10-CM

## 2020-06-21 ENCOUNTER — Encounter: Admit: 2020-06-21 | Discharge: 2020-06-21 | Payer: MEDICARE

## 2020-06-21 DIAGNOSIS — L03116 Cellulitis of left lower limb: Secondary | ICD-10-CM

## 2020-06-21 LAB — VANCOMYCIN TROUGH: VANCOMYCIN TROUGH: 15 — AB (ref 5.0–15.0)

## 2020-06-22 ENCOUNTER — Encounter: Admit: 2020-06-22 | Discharge: 2020-06-22 | Payer: MEDICARE

## 2020-06-22 ENCOUNTER — Ambulatory Visit: Admit: 2020-06-22 | Discharge: 2020-06-23 | Payer: MEDICARE

## 2020-06-22 DIAGNOSIS — J209 Acute bronchitis, unspecified: Secondary | ICD-10-CM

## 2020-06-22 DIAGNOSIS — L03116 Cellulitis of left lower limb: Secondary | ICD-10-CM

## 2020-06-22 DIAGNOSIS — I739 Peripheral vascular disease, unspecified: Secondary | ICD-10-CM

## 2020-06-22 DIAGNOSIS — D801 Nonfamilial hypogammaglobulinemia: Secondary | ICD-10-CM

## 2020-06-22 MED ORDER — MISCELLANEOUS MEDICAL SUPPLY MISC MISC
0 refills | 1.00000 days | Status: AC
Start: 2020-06-22 — End: ?

## 2020-06-22 NOTE — Telephone Encounter
Rx ordered, printed and faxed to [facility] attention [facility] to fax number [phone].    Confirmation of fax transmission received.    Etta Grandchild, RN     I also called [facility] and LVM with information.    Etta Grandchild, RN

## 2020-06-22 NOTE — Progress Notes
History of Present Illness  Ruben Wood. is a 80 y.o. male is here for follow up on cellulitis.     Obtained patient's verbal consent to treat them and their agreement to [facility] financial policy and NPP via this telehealth visit during the [facility]    I am following up with them for cellulitis.  He has been getting IV vancomycin infusions through his local hospital at [facility].  The redness on the left lower extremity is improving.  He does note that the warmth is better.  He is not having fevers or chills.  He is tolerating vancomycin well.    Also treating him for an acute asthma exacerbation.  I think that this is likely triggered by allergies especially as its been windy with lots pollen in the air as well [place] this time a year.  He is now on a prolonged prednisone taper.  His cough and shortness of breath have improved substantially.       Review of Systems  Per HPI    Objective:         ? acetaminophen (TYLENOL) 325 mg tablet Take two tablets by mouth every 6 hours as needed for Pain.   ? albuterol 0.083% (PROVENTIL) 2.5 mg /3 mL (0.083 %) nebulizer solution Inhale 3 mL solution by nebulizer as directed every 6 hours as needed for Wheezing or Shortness of Breath. Indications: asthma   ? albuterol sulfate (PROAIR HFA) 90 mcg/actuation HFA aerosol inhaler Inhale two puffs by mouth into the lungs every 4 hours as needed for Wheezing or Shortness of Breath.   ? allopurinoL (ZYLOPRIM) 300 mg tablet Take one tablet by mouth daily. Take with food.   ? AMITR/GABAPEN/EMU OIL 05/24/08% CREAM (COMPOUND) Apply topically to affected area three times daily as needed.   ? arformoteroL (BROVANA) 15 mcg/2 mL nebulizer solution Inhale 2 mL solution by nebulizer as directed twice daily. Dx: J44.9   ? ASCORBIC ACID-ASCORBATE SODIUM PO Take 500 mg by mouth every morning.   ? aspirin EC 81 mg tablet Take one tablet by mouth daily. Take with food.   ? atorvastatin (LIPITOR) 40 mg tablet Take one tablet by mouth daily.   ? benzonatate (TESSALON PERLES) 100 mg capsule Take one capsule by mouth every 8 hours.   ? budesonide (PULMICORT) 0.25 mg/2 mL nebulizer solution Inhale 2 mL solution by nebulizer as directed twice daily. Dx: J44.9   ? bumetanide (BUMEX) 1 mg tablet Take three tablets by mouth twice daily.   ? cholecalciferol (VITAMIN D-3) 1,000 units tablet Take 1,000 Units by mouth daily.   ? diclofenac sodium (VOLTAREN) 1 % topical gel Apply four g topically to affected area four times daily.   ? duloxetine DR (CYMBALTA) 60 mg capsule Take one capsule by mouth daily.   ? empagliflozin (JARDIANCE) 25 mg tablet Take one tablet by mouth daily.   ? finasteride (PROSCAR) 5 mg tablet Take one tablet by mouth daily.   ? fish oil- omega 3-DHA/EPA 300/1,000 mg capsule Take 1 capsule by mouth daily.   ? flash glucose scanning reader (FREESTYLE LIBRE) reader Use as directed.   ? flash glucose sensor (FREESTYLE LIBRE 14 DAY SENSOR) sensor Type 2 diabetes  Indications: type 2 diabetes mellitus   ? gabapentin (NEURONTIN) 100 mg capsule Take four capsules by mouth twice daily.   ? insulin pen needles (disposable) (BD ULTRA-FINE MINI PEN NEEDLE) 31 gauge x 3/16 pen needle Use with insulin  pens   ? ipratropium bromide (ATROVENT) 21 mcg (0.03 %) nasal spray Apply two sprays to each nostril as directed every 12 hours.   ? metOLazone (ZAROXOLYN) 2.5 mg tablet Take one tablet by mouth daily as needed. PER OUTPATIENT CARDIOLOGY PROVIDER AS NEEDED FOR FLUID RETENTION   ? Miscellaneous Medical Supply misc Vancomycin 2000 mg IV every 36 to 48hours x 4 doses, please draw vancomycin level per your protocol at [facility].   ? Miscellaneous Medical Supply misc Rx: Please wrap legs with ACE bandage from toes as high up to the leg as possible bilaterally  Dx: Lymphedema   ? montelukast (SINGULAIR) 10 mg tablet Take one tablet by mouth at bedtime daily.   ? nitroglycerin (NITROSTAT) 0.4 mg tablet Place 0.4 mg under tongue every 5 minutes as needed for Chest Pain. Max of 3 tablets, call 911.   ? nystatin (MYCOSTATIN) 100,000 units/mL oral suspension Swish and Swallow 5 mL by mouth as directed four times daily.   ? oxyCODONE (ROXICODONE) 5 mg tablet Take one tablet to two tablets by mouth every 8 hours as needed for Pain   ? pantoprazole DR (PROTONIX) 40 mg tablet Take one tablet by mouth twice daily.   ? polyethylene glycol 3350 (MIRALAX) 17 g packet Take two packets by mouth twice daily.   ? predniSONE (DELTASONE) 10 mg tablet Take four tablets by mouth daily for 5 days, THEN two tablets daily for 5 days, THEN one tablet daily for 5 days, THEN one-half tablet daily for 5 days.   ? rivaroxaban (XARELTO) 20 mg tablet Take one tablet by mouth daily with dinner. Take with food.   ? senna/docusate (SENOKOT-S) 8.6/50 mg tablet Take one tablet by mouth twice daily.   ? sodium chloride (NEBUSAL) 3 % nebulizer solution Inhale 4 mL by mouth into the lungs twice daily. Dx:  J47.9   ? spironolactone (ALDACTONE) 25 mg tablet Take one tablet by mouth twice daily. Take with food.   ? tamsulosin (FLOMAX) 0.4 mg capsule TAKE (1) CAPSULE BY MOUTH ONCE DAILY.   ? traMADoL (ULTRAM) 50 mg tablet Take one tablet by mouth every 8 hours as needed for Pain.   ? vancomycin (VANCOCIN) 1000 mg/20 mL injection 15mg /kg (2000mg ) q 12 hours x 7 days, please draw vanc level after 3rd dose and adjust per protocol   ? zinc sulfate 220 mg (50 mg elemental zinc) capsule Take 220 mg by mouth daily.     There were no vitals filed for this visit.    There is no height or weight on file to calculate BMI.     Physical Exam  Constitutional:       Appearance: Normal appearance.   HENT:      Head: Normocephalic and atraumatic.   Pulmonary:      Effort: Pulmonary effort is normal.   Skin:     Findings: Erythema present.      Comments: He has erythema of the left lower extremity, but it is improving compared to last week.  When Ed touches the leg, it feels less warm than what it has.  Exam is limited given the virtual format.   Neurological:      General: No focal deficit present.      Mental Status: He is alert.   Psychiatric:         Mood and Affect: Mood normal.         Labwork reviewed:  Lab Results   Component Value Date/Time  HGBA1C 6.7 (H) 04/04/2020 05:40 AM    HGBA1C 5.9 11/25/2019 02:45 PM    HGBA1C 5.9 09/02/2019 09:50 AM    HGBPOC 14.3 04/08/2020 06:58 AM    HGBPOC 11.2 (L) 10/25/2012 10:58 AM    HGBPOC 12.9 (L) 10/25/2012 08:55 AM    MCALBR 7.2 05/18/2020 01:45 PM    TSH 2.49 03/05/2020 08:40 PM    FREET4R 0.99 12/15/2014 09:12 AM    CHOL 161 04/04/2020 05:40 AM    TRIG 152 (H) 04/04/2020 05:40 AM    HDL 53 04/04/2020 05:40 AM    LDL 87 04/04/2020 05:40 AM    NA 144 06/15/2020 02:50 PM    K 3.4 (L) 06/15/2020 02:50 PM    CL 102 06/15/2020 02:50 PM    CO2 28 06/15/2020 02:50 PM    GAP 14 (H) 06/15/2020 02:50 PM    BUN 37 (H) 06/15/2020 02:50 PM    CR 1.42 (H) 06/15/2020 02:50 PM    GLU 112 (H) 06/15/2020 02:50 PM    CA 7.2 (L) 06/15/2020 02:50 PM    PO4 4.1 03/08/2020 03:51 AM    ALBUMIN 3.8 06/15/2020 02:50 PM    TOTPROT 6.0 06/15/2020 02:50 PM    ALKPHOS 67 06/15/2020 02:50 PM    AST 13 06/15/2020 02:50 PM    ALT 9 06/15/2020 02:50 PM    TOTBILI 0.8 06/15/2020 02:50 PM    GFR 44 01/30/2020 12:00 AM    GFRAA >60 12/03/2019 04:51 AM    PSA 2.96 11/25/2018 12:00 AM         Assessment and Plan:    1. Cellulitis of left lower extremity    2. Peripheral vascular disease (HCC)    3. Chronic bronchitis with acute exacerbation (HCC)    4. Hypogammaglobulinemia (HCC)      His cellulitis appears to be improving.  It is not quite resolved, so I do want to extend his antibiotic course out for a 10-day total course.  He has completed 7 days to date, so we will coordinate with [facility] to extend his vancomycin 1500 mg daily x3 more days.  He does have peripheral vascular disease which is likely complicating the healing process in addition to hypogammaglobulinemia.  Unfortunate, we do need to continue him on the prednisone which will further immunosuppressed him given the acute asthma and bronchitis exacerbation.  This does seem to be helping his breathing and coughing, so we will continue with a slow taper as outlined previously.    Patient Instructions     Future Appointments   Date Time Provider Department Center   06/22/2020 12:30 PM Justinn Welter, [name], MD [facility] IM   07/02/2020  2:30 PM Irineo Axon, PHARMD [facility] IM   08/05/2020  1:30 PM Sharol Given, Shela Commons, APRN-NP [facility] CVM Exam   08/11/2020  2:20 PM Joanie Coddington, MD [facility] SPINE   08/20/2020 10:20 AM Hyman Bower A, DO [facility] IM   08/27/2020 10:40 AM Deveron Furlong, MD [facility] IM   10/27/2020  1:30 PM Donnelly Stager, MD [facility] CVM Exam   10/27/2020  2:30 PM PAC ROOM 7 PRE None   11/08/2020  8:00 AM [place] T[facility] CATH CLE [facility] CVM Procedur   06/02/2021  1:00 PM [name], Lenice Llamas, PA-C [facility] Urology       If you are on Mychart, you will now be able to read your clinic note from me.  My hope is that this will improve your engagement and insight into  your health care and improve the accuracy of your medical record.  If you find inaccuracies, I apologize.  Please bring any concerns or inaccuracies to your next appointment.  Please remember that medical language and documentation is very different from how you and I speak and our dictation software is not perfect.    General Instructions:  ? How to reach me:   Please send a MyChart message to the General Medicine clinic or call Wynona Canes at [phone].    ? How to get a medication refill:  Please use the MyChart Refill request or contact your pharmacy directly to request medication refills. Please allow 48 hours.     ? How to receive your test results:  If you have signed up for MyChart, you will receive your test results and messages from me this way.  Otherwise, you will get a phone call or letter.   If you are expecting results and have not heard from my office within 2 weeks of your testing, please send a MyChart message or call my office.     ? Scheduling:  Our Scheduling phone number is [phone].  Same Day appointments are usually available. Please ask for an annual or yearly physical appointment if it has been over 1 year since our last appointment.  ? Appointment Reminders on your cell phone: Make sure we have your cell phone number, and Text Donald to [phone].  ? Support groups for many chronic illnesses are available through [facility]: [email or web address] or [phone].             Return in 4 weeks (on 07/20/2020).

## 2020-06-22 NOTE — Patient Instructions
Future Appointments   Date Time Provider Department Center   06/22/2020 12:30 PM Elizette Shek, [name], MD [facility] IM   07/02/2020  2:30 PM Irineo Axon, PHARMD [facility] IM   08/05/2020  1:30 PM Sharol Given, Shela Commons, APRN-NP [facility] CVM Exam   08/11/2020  2:20 PM Joanie Coddington, MD [facility] SPINE   08/20/2020 10:20 AM Hyman Bower A, DO [facility] IM   08/27/2020 10:40 AM Deveron Furlong, MD [facility] IM   10/27/2020  1:30 PM Donnelly Stager, MD [facility] CVM Exam   10/27/2020  2:30 PM PAC ROOM 7 PRE None   11/08/2020  8:00 AM [place] TEE CATH CLE [facility] CVM Procedur   06/02/2021  1:00 PM [name], Lenice Llamas, PA-C [facility] Urology       If you are on Mychart, you will now be able to read your clinic note from me.  My hope is that this will improve your engagement and insight into your health care and improve the accuracy of your medical record.  If you find inaccuracies, I apologize.  Please bring any concerns or inaccuracies to your next appointment.  Please remember that medical language and documentation is very different from how you and I speak and our dictation software is not perfect.    General Instructions:  ? How to reach me:   Please send a MyChart message to the General Medicine clinic or call Wynona Canes at [phone].    ? How to get a medication refill:  Please use the MyChart Refill request or contact your pharmacy directly to request medication refills. Please allow 48 hours.     ? How to receive your test results:  If you have signed up for MyChart, you will receive your test results and messages from me this way.  Otherwise, you will get a phone call or letter.   If you are expecting results and have not heard from my office within 2 weeks of your testing, please send a MyChart message or call my office.     ? Scheduling:  Our Scheduling phone number is [phone].  Same Day appointments are usually available. Please ask for an annual or yearly physical appointment if it has been over 1 year since our last appointment.  ? Appointment Reminders on your cell phone: Make sure we have your cell phone number, and Text  to [phone].  ? Support groups for many chronic illnesses are available through [facility]: [email or web address] or [phone].

## 2020-06-22 NOTE — Telephone Encounter
Rx for [facility] need to be signed for additional Vancomycin doses.    Routing to Dr. Crecencio Mc to advise on how many additional doses are needed.    Etta Grandchild, RN

## 2020-06-25 ENCOUNTER — Encounter: Admit: 2020-06-25 | Discharge: 2020-06-25 | Payer: MEDICARE

## 2020-06-25 DIAGNOSIS — L03116 Cellulitis of left lower limb: Secondary | ICD-10-CM

## 2020-06-25 LAB — VANCOMYCIN TROUGH: VANCOMYCIN TROUGH: 17 mg/dL — AB (ref 5.0–15.0)

## 2020-06-25 MED ORDER — MISCELLANEOUS MEDICAL SUPPLY MISC MISC
0 refills | 1.00000 days | Status: AC
Start: 2020-06-25 — End: ?

## 2020-06-25 NOTE — Telephone Encounter
[facility] called.  LVM with information and verbal orders.  Sent patient information in my chart as well.    Orders will need to be signed upon Dr. Lowella Bandy return to clinic on Monday and Faxed as requested.    Etta Grandchild, RN

## 2020-06-25 NOTE — Telephone Encounter
[facility] called stating trough drawn and faxed (I entered information in epic) level 17.3.  Pharmacy advising for patient to continue with 1,500 mg daily dosage.    She states foot remains red, but is better.  Evette Cristal is 1/2 the size it was.  She feels he would benefit from continuing treatment.    She will send [employer] to send to [place].    Emelia Salisbury can be reached at [phone] Ext [phone]    Orders can be faxed to [phone]    Routing to Dr. Crecencio Mc to advise  Etta Grandchild, RN

## 2020-06-25 NOTE — Telephone Encounter
Thank you.  I agree with [facility], let's extend this out another 4 days (14 total days).  Can you send in Vancomycin 1500mg  daily x 4 days (total 14 total antibiotic days)?

## 2020-06-28 ENCOUNTER — Encounter: Admit: 2020-06-28 | Discharge: 2020-06-28 | Payer: MEDICARE

## 2020-06-28 DIAGNOSIS — L03116 Cellulitis of left lower limb: Secondary | ICD-10-CM

## 2020-06-28 LAB — VANCOMYCIN TROUGH: VANCOMYCIN TROUGH: 18 — AB (ref 5.0–15.0)

## 2020-06-28 MED ORDER — NYSTATIN 100,000 UNIT/GRAM TP CREA
Freq: Two times a day (BID) | TOPICAL | 3 refills | 30.00000 days | Status: AC
Start: 2020-06-28 — End: ?

## 2020-06-28 NOTE — Telephone Encounter
Rx signed.

## 2020-06-28 NOTE — Telephone Encounter
Yes, I think we can transition to PO antibiotics now. I want him to continue with his doxycycline which he has at home already. We can STOP IV vancomycin now.

## 2020-06-28 NOTE — Telephone Encounter
[facility] nurse called to advise Vancomycin Trough level was 18.9 today.    Asking what Dr. Crecencio Mc would like to do now?  States they can continue at the daily 1,500 mg dosing however the wound is decreased significantly to size of 1.4x0.6 and superficial to left shin.  States toe nail is loose and hanging on until it comes off on it's own as not to introduce infection.      Patient is unable to get IV antibiotics on Tuesday/ Thursday this week.  Wondering if they can change to oral antibiotics instead    Routing to Dr. Crecencio Mc to advise  Etta Grandchild, RN

## 2020-06-28 NOTE — Telephone Encounter
[facility] notified.  She will go over plan with patient and his wife.  Asking when Dr. Crecencio Mc will follow up with patient.    Routing to Dr. Crecencio Mc to advise on follow up    Etta Grandchild, RN

## 2020-06-28 NOTE — Telephone Encounter
Could he be seen Friday at 1:20pm? I would like to follow up with him then. Telehealth would be okay.

## 2020-06-28 NOTE — Telephone Encounter
Patient's wife requesting  Some refill protocol elements NOT Met  Medication name: nystatin cream  Medication Strength: topical cream/ointment    Last Fill Date: not on med list previously ordered by [facility] provider    Non-delegated medication    Routed to Provider

## 2020-06-29 ENCOUNTER — Encounter: Admit: 2020-06-29 | Discharge: 2020-06-29 | Payer: MEDICARE

## 2020-07-02 ENCOUNTER — Encounter: Admit: 2020-07-02 | Discharge: 2020-07-02 | Payer: MEDICARE

## 2020-07-02 ENCOUNTER — Ambulatory Visit: Admit: 2020-07-02 | Discharge: 2020-07-03 | Payer: MEDICARE

## 2020-07-02 DIAGNOSIS — J479 Bronchiectasis, uncomplicated: Secondary | ICD-10-CM

## 2020-07-02 DIAGNOSIS — L03116 Cellulitis of left lower limb: Secondary | ICD-10-CM

## 2020-07-02 NOTE — Patient Instructions
Future Appointments   Date Time Provider Department Center   07/20/2020  2:40 PM Zael Shuman, [name], MD [facility] IM   08/05/2020  1:30 PM Sharol Given, Shela Commons, APRN-NP [facility] CVM Exam   08/11/2020  2:20 PM Joanie Coddington, MD [facility] SPINE   08/20/2020 10:20 AM Hyman Bower A, DO [facility] IM   08/27/2020 10:40 AM Deveron Furlong, MD [facility] IM   10/27/2020  1:30 PM Donnelly Stager, MD [facility] CVM Exam   10/27/2020  2:30 PM PAC ROOM 7 PRE None   11/08/2020  8:00 AM [place] TEE CATH CLE [facility] CVM Procedur   06/02/2021  1:00 PM [name], Lenice Llamas, PA-C [facility] Urology       If you are on Mychart, you will now be able to read your clinic note from me.  My hope is that this will improve your engagement and insight into your health care and improve the accuracy of your medical record.  If you find inaccuracies, I apologize.  Please bring any concerns or inaccuracies to your next appointment.  Please remember that medical language and documentation is very different from how you and I speak and our dictation software is not perfect.    General Instructions:  ? How to reach me:   Please send a MyChart message to the General Medicine clinic or call Wynona Canes at [phone].    ? How to get a medication refill:  Please use the MyChart Refill request or contact your pharmacy directly to request medication refills. Please allow 48 hours.     ? How to receive your test results:  If you have signed up for MyChart, you will receive your test results and messages from me this way.  Otherwise, you will get a phone call or letter.   If you are expecting results and have not heard from my office within 2 weeks of your testing, please send a MyChart message or call my office.     ? Scheduling:  Our Scheduling phone number is [phone].  Same Day appointments are usually available. Please ask for an annual or yearly physical appointment if it has been over 1 year since our last appointment.  ? Appointment Reminders on your cell phone: Make sure we have your cell phone number, and Text [place] to [phone].  ? Support groups for many chronic illnesses are available through [facility]: [email or web address] or [phone].

## 2020-07-02 NOTE — Progress Notes
History of Present Illness  Ruben Wood. is a 80 y.o. male is here for follow up on cellulitis.     Obtained patient's verbal consent to treat them and their agreement to [facility] financial policy and NPP via this telehealth visit during the [facility]    And has been having cellulitis and we have been getting him IV vancomycin infusions at his local hospital [facility].  He is doing well now.  He has completed his vancomycin course.  We ended up doing a 14-day course.  He still has some redness in that left foot, but it is less warm and not edematous.  The warmth feels similar to the right side.  He is not having any fevers or chills.  He is now on doxycycline which he takes for suppressive antibiotic therapy for his frequent asthma and bronchiectasis flares.    We are also treating him for an acute asthma exacerbation.  He was on a prolonged steroid taper.  He still has 2 more days left.  His cough and shortness of breath are much better.  His oxygen levels are stable.         Review of Systems  Per HPI    Objective:         ? acetaminophen (TYLENOL) 325 mg tablet Take two tablets by mouth every 6 hours as needed for Pain.   ? albuterol 0.083% (PROVENTIL) 2.5 mg /3 mL (0.083 %) nebulizer solution Inhale 3 mL solution by nebulizer as directed every 6 hours as needed for Wheezing or Shortness of Breath. Indications: asthma   ? albuterol sulfate (PROAIR HFA) 90 mcg/actuation HFA aerosol inhaler Inhale two puffs by mouth into the lungs every 4 hours as needed for Wheezing or Shortness of Breath.   ? allopurinoL (ZYLOPRIM) 300 mg tablet Take one tablet by mouth daily. Take with food.   ? AMITR/GABAPEN/EMU OIL 05/24/08% CREAM (COMPOUND) Apply topically to affected area three times daily as needed.   ? arformoteroL (BROVANA) 15 mcg/2 mL nebulizer solution Inhale 2 mL solution by nebulizer as directed twice daily. Dx: J44.9   ? ASCORBIC ACID-ASCORBATE SODIUM PO Take 500 mg by mouth every morning.   ? aspirin EC 81 mg tablet Take one tablet by mouth daily. Take with food.   ? atorvastatin (LIPITOR) 40 mg tablet Take one tablet by mouth daily.   ? benzonatate (TESSALON PERLES) 100 mg capsule Take one capsule by mouth every 8 hours.   ? budesonide (PULMICORT) 0.25 mg/2 mL nebulizer solution Inhale 2 mL solution by nebulizer as directed twice daily. Dx: J44.9   ? bumetanide (BUMEX) 1 mg tablet Take three tablets by mouth twice daily.   ? cholecalciferol (VITAMIN D-3) 1,000 units tablet Take 1,000 Units by mouth daily.   ? diclofenac sodium (VOLTAREN) 1 % topical gel Apply four g topically to affected area four times daily.   ? duloxetine DR (CYMBALTA) 60 mg capsule Take one capsule by mouth daily.   ? empagliflozin (JARDIANCE) 25 mg tablet Take one tablet by mouth daily.   ? finasteride (PROSCAR) 5 mg tablet Take one tablet by mouth daily.   ? fish oil- omega 3-DHA/EPA 300/1,000 mg capsule Take 1 capsule by mouth daily.   ? flash glucose scanning reader (FREESTYLE LIBRE) reader Use as directed.   ? flash glucose sensor (FREESTYLE LIBRE 14 DAY SENSOR) sensor Type 2 diabetes  Indications: type 2 diabetes mellitus   ? gabapentin (NEURONTIN) 100 mg capsule  Take four capsules by mouth twice daily.   ? insulin pen needles (disposable) (BD ULTRA-FINE MINI PEN NEEDLE) 31 gauge x 3/16 pen needle Use with insulin pens   ? ipratropium bromide (ATROVENT) 21 mcg (0.03 %) nasal spray Apply two sprays to each nostril as directed every 12 hours.   ? metOLazone (ZAROXOLYN) 2.5 mg tablet Take one tablet by mouth daily as needed. PER OUTPATIENT CARDIOLOGY PROVIDER AS NEEDED FOR FLUID RETENTION   ? Miscellaneous Medical Supply misc Vancomycin 1,500 mg IV every 24hours starting 06/25/20 x 4 days (14 total antibiotic days), please draw vancomycin level per your protocol at [facility].   ? Miscellaneous Medical Supply misc Rx: Please wrap legs with ACE bandage from toes as high up to the leg as possible bilaterally  Dx: Lymphedema   ? montelukast (SINGULAIR) 10 mg tablet Take one tablet by mouth at bedtime daily.   ? nitroglycerin (NITROSTAT) 0.4 mg tablet Place 0.4 mg under tongue every 5 minutes as needed for Chest Pain. Max of 3 tablets, call 911.   ? nystatin (MYCOSTATIN) 100,000 unit/g topical cream Apply  topically to affected area twice daily.   ? oxyCODONE (ROXICODONE) 5 mg tablet Take one tablet to two tablets by mouth every 8 hours as needed for Pain   ? pantoprazole DR (PROTONIX) 40 mg tablet Take one tablet by mouth twice daily.   ? polyethylene glycol 3350 (MIRALAX) 17 g packet Take two packets by mouth twice daily.   ? predniSONE (DELTASONE) 10 mg tablet Take four tablets by mouth daily for 5 days, THEN two tablets daily for 5 days, THEN one tablet daily for 5 days, THEN one-half tablet daily for 5 days.   ? rivaroxaban (XARELTO) 20 mg tablet Take one tablet by mouth daily with dinner. Take with food.   ? senna/docusate (SENOKOT-S) 8.6/50 mg tablet Take one tablet by mouth twice daily.   ? sodium chloride (NEBUSAL) 3 % nebulizer solution Inhale 4 mL by mouth into the lungs twice daily. Dx:  J47.9   ? spironolactone (ALDACTONE) 25 mg tablet Take one tablet by mouth twice daily. Take with food.   ? tamsulosin (FLOMAX) 0.4 mg capsule TAKE (1) CAPSULE BY MOUTH ONCE DAILY.   ? traMADoL (ULTRAM) 50 mg tablet Take one tablet by mouth every 8 hours as needed for Pain.   ? zinc sulfate 220 mg (50 mg elemental zinc) capsule Take 220 mg by mouth daily.     Vitals:    07/02/20 1320   PainSc: Zero   Height: 180.3 cm (5' 11)       Body mass index is 40.2 kg/m?[name]     Physical Exam  Constitutional:       Appearance: Normal appearance.   HENT:      Head: Normocephalic and atraumatic.   Pulmonary:      Effort: Pulmonary effort is normal.   Skin:     General: Skin is warm.      Findings: Erythema present.      Comments: Still with redness in the leg, but warmth and edema has improved.    Neurological: General: No focal deficit present.      Mental Status: He is alert.   Psychiatric:         Mood and Affect: Mood normal.         Labwork reviewed:  Lab Results   Component Value Date/Time    HGBA1C 6.7 (H) 04/04/2020 05:40 AM    HGBA1C 5.9  11/25/2019 02:45 PM    HGBA1C 5.9 09/02/2019 09:50 AM    HGBPOC 14.3 04/08/2020 06:58 AM    HGBPOC 11.2 (L) 10/25/2012 10:58 AM    HGBPOC 12.9 (L) 10/25/2012 08:55 AM    MCALBR 7.2 05/18/2020 01:45 PM    TSH 2.49 03/05/2020 08:40 PM    FREET4R 0.99 12/15/2014 09:12 AM    CHOL 161 04/04/2020 05:40 AM    TRIG 152 (H) 04/04/2020 05:40 AM    HDL 53 04/04/2020 05:40 AM    LDL 87 04/04/2020 05:40 AM    NA 144 06/15/2020 02:50 PM    K 3.4 (L) 06/15/2020 02:50 PM    CL 102 06/15/2020 02:50 PM    CO2 28 06/15/2020 02:50 PM    GAP 14 (H) 06/15/2020 02:50 PM    BUN 37 (H) 06/15/2020 02:50 PM    CR 1.42 (H) 06/15/2020 02:50 PM    GLU 112 (H) 06/15/2020 02:50 PM    CA 7.2 (L) 06/15/2020 02:50 PM    PO4 4.1 03/08/2020 03:51 AM    ALBUMIN 3.8 06/15/2020 02:50 PM    TOTPROT 6.0 06/15/2020 02:50 PM    ALKPHOS 67 06/15/2020 02:50 PM    AST 13 06/15/2020 02:50 PM    ALT 9 06/15/2020 02:50 PM    TOTBILI 0.8 06/15/2020 02:50 PM    GFR 44 01/30/2020 12:00 AM    GFRAA >60 12/03/2019 04:51 AM    PSA 2.96 11/25/2018 12:00 AM         Assessment and Plan:    1. Cellulitis of left lower extremity    2. Bronchiectasis without complication (HCC)      1.  His cellulitis has resolved.  We will continue to monitor this going forward.    2.  He does have bronchiectasis for which she is having an acute flare, but he is improving on the prednisone taper.  We will continue to monitor this I did not make any changes to the regimen today.          Patient Instructions     Future Appointments   Date Time Provider Department Center   07/20/2020  2:40 PM Mikaella Escalona, [name], MD [facility] IM   08/05/2020  1:30 PM Sharol Given, Shela Commons, APRN-NP [facility] CVM Exam   08/11/2020  2:20 PM Joanie Coddington, MD [facility] SPINE   08/20/2020 10:20 AM Hyman Bower A, DO [facility] IM   08/27/2020 10:40 AM Deveron Furlong, MD [facility] IM   10/27/2020  1:30 PM Donnelly Stager, MD [facility] CVM Exam   10/27/2020  2:30 PM PAC ROOM 7 PRE None   11/08/2020  8:00 AM [place] TEE CATH CLE [facility] CVM Procedur   06/02/2021  1:00 PM [name], Lenice Llamas, PA-C [facility] Urology       If you are on Mychart, you will now be able to read your clinic note from me.  My hope is that this will improve your engagement and insight into your health care and improve the accuracy of your medical record.  If you find inaccuracies, I apologize.  Please bring any concerns or inaccuracies to your next appointment.  Please remember that medical language and documentation is very different from how you and I speak and our dictation software is not perfect.    General Instructions:  ? How to reach me:   Please send a MyChart message to the General Medicine clinic or call Wynona Canes at [phone].    ? How to get a  medication refill:  Please use the MyChart Refill request or contact your pharmacy directly to request medication refills. Please allow 48 hours.     ? How to receive your test results:  If you have signed up for MyChart, you will receive your test results and messages from me this way.  Otherwise, you will get a phone call or letter.   If you are expecting results and have not heard from my office within 2 weeks of your testing, please send a MyChart message or call my office.     ? Scheduling:  Our Scheduling phone number is [phone].  Same Day appointments are usually available. Please ask for an annual or yearly physical appointment if it has been over 1 year since our last appointment.  ? Appointment Reminders on your cell phone: Make sure we have your cell phone number, and Text  to [phone].  ? Support groups for many chronic illnesses are available through [facility]: [email or web address] or [phone].             No follow-ups on file.

## 2020-07-14 ENCOUNTER — Encounter: Admit: 2020-07-14 | Discharge: 2020-07-14 | Payer: MEDICARE

## 2020-07-15 ENCOUNTER — Encounter: Admit: 2020-07-15 | Discharge: 2020-07-15 | Payer: MEDICARE

## 2020-07-15 DIAGNOSIS — I5043 Acute on chronic combined systolic (congestive) and diastolic (congestive) heart failure: Secondary | ICD-10-CM

## 2020-07-20 ENCOUNTER — Ambulatory Visit: Admit: 2020-07-20 | Discharge: 2020-07-20 | Payer: MEDICARE

## 2020-07-20 ENCOUNTER — Encounter: Admit: 2020-07-20 | Discharge: 2020-07-20 | Payer: MEDICARE

## 2020-07-20 DIAGNOSIS — M961 Postlaminectomy syndrome, not elsewhere classified: Secondary | ICD-10-CM

## 2020-07-20 DIAGNOSIS — Z8614 Personal history of Methicillin resistant Staphylococcus aureus infection: Secondary | ICD-10-CM

## 2020-07-20 DIAGNOSIS — U071 Pneumonia due to COVID-19 virus: Secondary | ICD-10-CM

## 2020-07-20 DIAGNOSIS — I2510 Atherosclerotic heart disease of native coronary artery without angina pectoris: Secondary | ICD-10-CM

## 2020-07-20 DIAGNOSIS — R55 Syncope and collapse: Secondary | ICD-10-CM

## 2020-07-20 DIAGNOSIS — E782 Mixed hyperlipidemia: Secondary | ICD-10-CM

## 2020-07-20 DIAGNOSIS — K219 Gastro-esophageal reflux disease without esophagitis: Secondary | ICD-10-CM

## 2020-07-20 DIAGNOSIS — J479 Bronchiectasis, uncomplicated: Secondary | ICD-10-CM

## 2020-07-20 DIAGNOSIS — I10 Essential (primary) hypertension: Secondary | ICD-10-CM

## 2020-07-20 DIAGNOSIS — I255 Ischemic cardiomyopathy: Secondary | ICD-10-CM

## 2020-07-20 DIAGNOSIS — R609 Edema, unspecified: Secondary | ICD-10-CM

## 2020-07-20 DIAGNOSIS — N401 Enlarged prostate with lower urinary tract symptoms: Secondary | ICD-10-CM

## 2020-07-20 DIAGNOSIS — R0600 Dyspnea, unspecified: Secondary | ICD-10-CM

## 2020-07-20 DIAGNOSIS — J45909 Unspecified asthma, uncomplicated: Secondary | ICD-10-CM

## 2020-07-20 DIAGNOSIS — L03116 Cellulitis of left lower limb: Secondary | ICD-10-CM

## 2020-07-20 DIAGNOSIS — Z8679 Personal history of other diseases of the circulatory system: Secondary | ICD-10-CM

## 2020-07-20 DIAGNOSIS — I509 Heart failure, unspecified: Secondary | ICD-10-CM

## 2020-07-20 DIAGNOSIS — I429 Cardiomyopathy, unspecified: Secondary | ICD-10-CM

## 2020-07-20 DIAGNOSIS — Z95828 Presence of other vascular implants and grafts: Secondary | ICD-10-CM

## 2020-07-20 DIAGNOSIS — J302 Other seasonal allergic rhinitis: Secondary | ICD-10-CM

## 2020-07-20 DIAGNOSIS — N183 CKD (chronic kidney disease) stage 3, GFR 30-59 ml/min (HCC): Secondary | ICD-10-CM

## 2020-07-20 DIAGNOSIS — I5042 Chronic combined systolic (congestive) and diastolic (congestive) heart failure: Secondary | ICD-10-CM

## 2020-07-20 DIAGNOSIS — Z9981 Dependence on supplemental oxygen: Secondary | ICD-10-CM

## 2020-07-20 DIAGNOSIS — J449 Chronic obstructive pulmonary disease, unspecified: Secondary | ICD-10-CM

## 2020-07-20 DIAGNOSIS — J189 Pneumonia, unspecified organism: Secondary | ICD-10-CM

## 2020-07-20 DIAGNOSIS — I714 Abdominal aortic aneurysm, without rupture: Secondary | ICD-10-CM

## 2020-07-20 DIAGNOSIS — Z95810 Presence of automatic (implantable) cardiac defibrillator: Secondary | ICD-10-CM

## 2020-07-20 DIAGNOSIS — M954 Acquired deformity of chest and rib: Secondary | ICD-10-CM

## 2020-07-20 DIAGNOSIS — E1165 Type 2 diabetes mellitus with hyperglycemia: Secondary | ICD-10-CM

## 2020-07-20 DIAGNOSIS — R053 Chronic cough: Secondary | ICD-10-CM

## 2020-07-20 DIAGNOSIS — G4733 Obstructive sleep apnea (adult) (pediatric): Secondary | ICD-10-CM

## 2020-07-20 MED ORDER — GABAPENTIN 100 MG PO CAP
400 mg | ORAL_CAPSULE | Freq: Two times a day (BID) | ORAL | 3 refills | Status: AC
Start: 2020-07-20 — End: ?

## 2020-07-20 MED ORDER — OZEMPIC 0.25 MG OR 0.5 MG(2 MG/1.5 ML) SC PNIJ
.25 mg | SUBCUTANEOUS | 1 refills | Status: AC
Start: 2020-07-20 — End: ?

## 2020-07-20 MED ORDER — SPIRONOLACTONE 25 MG PO TAB
25 mg | ORAL_TABLET | Freq: Every day | ORAL | 3 refills | 90.00000 days | Status: AC
Start: 2020-07-20 — End: ?

## 2020-07-20 NOTE — Patient Instructions
Future Appointments   Date Time Provider Department Center   07/28/2020 11:00 AM [facility] Radiology   08/05/2020  1:30 PM Pearson, Shela Commons, APRN-NP [facility] CVM Exam   08/11/2020  2:20 PM Joanie Coddington, MD [facility] SPINE   08/16/2020  4:00 PM [facility]IT[facility] CVM Procedur   08/20/2020 10:20 AM Hyman Bower A, DO [facility] IM   08/27/2020 10:40 AM Deveron Furlong, MD [facility]ULM IM   09/16/2020  4:00 PM [facility] REMOTE MONITORING MACREMOTEHRM CVM Procedur   10/18/2020  4:00 PM [facility] REMOTE MONITORING MACREMOTEHRM CVM Procedur   10/27/2020  1:30 PM Donnelly Stager, MD [facility] CVM Exam   10/27/2020  2:30 PM PAC ROOM 7 PRE None   11/08/2020  8:00 AM [place] TEE CATH CLE [facility] CVM Procedur   11/18/2020  4:00 PM [facility] REMOTE MONITORING MACREMOTEHRM CVM Procedur   12/20/2020  4:00 PM MAC REMOTE MONITORING MACREMOTEHRM CVM Procedur   01/20/2021  4:00 PM MAC REMOTE MONITORING MACREMOTEHRM CVM Procedur   02/21/2021  4:00 PM MAC REMOTE MONITORING MACREMOTEHRM CVM Procedur   03/24/2021  4:00 PM MAC REMOTE MONITORING MACREMOTEHRM CVM Procedur   04/25/2021  4:00 PM MAC REMOTE MONITORING MACREMOTEHRM CVM Procedur   05/26/2021  4:00 PM [facility] REMOTE MONITORING MACREMOTEHRM CVM Procedur   06/02/2021  1:00 PM [name], Lenice Llamas, PA-C [facility] Urology   06/27/2021  4:00 PM [facility] REMOTE MONITORING MACREMOTEHRM CVM Procedur       If you are on Mychart, you will now be able to read your clinic note from me.  My hope is that this will improve your engagement and insight into your health care and improve the accuracy of your medical record.  If you find inaccuracies, I apologize.  Please bring any concerns or inaccuracies to your next appointment.  Please remember that medical language and documentation is very different from how you and I speak and our dictation software is not perfect.    General Instructions:  ? How to reach me:   Please send a MyChart message to the General Medicine clinic or call Wynona Canes at [phone]. ? How to get a medication refill:  Please use the MyChart Refill request or contact your pharmacy directly to request medication refills. Please allow 48 hours.     ? How to receive your test results:  If you have signed up for MyChart, you will receive your test results and messages from me this way.  Otherwise, you will get a phone call or letter.   If you are expecting results and have not heard from my office within 2 weeks of your testing, please send a MyChart message or call my office.     ? Scheduling:  Our Scheduling phone number is [phone].  Same Day appointments are usually available. Please ask for an annual or yearly physical appointment if it has been over 1 year since our last appointment.  ? Appointment Reminders on your cell phone: Make sure we have your cell phone number, and Text [place] to [phone].  ? Support groups for many chronic illnesses are available through [facility]: [email or web address] or [phone].

## 2020-07-20 NOTE — Progress Notes
History of Present Illness  Ruben Wood. is a 80 y.o. male is here for comprehensive follow up.     Ed has had 2 syncopal events in the past few weeks.  Both of them had similar circumstances surrounding them.  Both of them occurred after he had finished physical therapy and he was running back in the car and then will as he stood up to get out of the car he felt lightheaded and lost consciousness.  He regained consciousness fairly quickly, few seconds later.  He did not sustain any injuries.  He did not feel any heart fluttering or palpitations or chest pain associated with this.  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  He tends to have a low blood pressure at baseline and is just on spironolactone 25 mg twice daily.  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  He was called recently and told that there was some sort of concerning rhythm noticed on the ICD, but he was feeling fine at that time did not seek further follow-up at the time.    He has type 2 diabetes.  He also struggles with severe obesity.  He has not picked up the semaglutide, but he is interested in starting that.  We talked about the risk and benefits of this.  He has had some upset stomach with GLP-1 agonist in the past, but he wants to restart this today.    He needs a refill on his gabapentin.  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       Review of Systems  Per HPI    Objective:         ? acetaminophen (TYLENOL) 325 mg tablet Take two tablets by mouth every 6 hours as needed for Pain.   ? albuterol 0.083% (PROVENTIL) 2.5 mg /3 mL (0.083 %) nebulizer solution Inhale 3 mL solution by nebulizer as directed every 6 hours as needed for Wheezing or Shortness of Breath. Indications: asthma   ? albuterol sulfate (PROAIR HFA) 90 mcg/actuation HFA aerosol inhaler Inhale two puffs by mouth into the lungs every 4 hours as needed for Wheezing or Shortness of Breath.   ? allopurinoL (ZYLOPRIM) 300 mg tablet Take one tablet by mouth daily. Take with food.   ? AMITR/GABAPEN/EMU OIL 05/24/08% CREAM (COMPOUND) Apply topically to affected area three times daily as needed.   ? arformoteroL (BROVANA) 15 mcg/2 mL nebulizer solution Inhale 2 mL solution by nebulizer as directed twice daily. Dx: J44.9   ? ASCORBIC ACID-ASCORBATE SODIUM PO Take 500 mg by mouth every morning.   ? aspirin EC 81 mg tablet Take one tablet by mouth daily. Take with food.   ? atorvastatin (LIPITOR) 40 mg tablet Take one tablet by mouth daily.   ? benzonatate (TESSALON PERLES) 100 mg capsule Take one capsule by mouth every 8 hours.   ? budesonide (PULMICORT) 0.25 mg/2 mL nebulizer solution Inhale 2 mL solution by nebulizer as directed twice daily. Dx: J44.9   ? bumetanide (BUMEX) 1 mg tablet Take three tablets by mouth twice daily.   ? cholecalciferol (VITAMIN D-3) 1,000 units tablet Take 1,000 Units by mouth daily.   ? diclofenac sodium (VOLTAREN) 1 % topical gel Apply four g topically to affected area four times daily.   ? duloxetine DR (CYMBALTA) 60 mg capsule Take one capsule by mouth daily.   ? empagliflozin (JARDIANCE) 25 mg tablet Take one tablet by mouth daily.   ?  finasteride (PROSCAR) 5 mg tablet Take one tablet by mouth daily.   ? fish oil- omega 3-DHA/EPA 300/1,000 mg capsule Take 1 capsule by mouth daily.   ? flash glucose scanning reader (FREESTYLE LIBRE) reader Use as directed.   ? flash glucose sensor (FREESTYLE LIBRE 14 DAY SENSOR) sensor Type 2 diabetes  Indications: type 2 diabetes mellitus   ? gabapentin (NEURONTIN) 100 mg capsule Take four capsules by mouth twice daily.   ? insulin pen needles (disposable) (BD ULTRA-FINE MINI PEN NEEDLE) 31 gauge x 3/16 pen needle Use with insulin pens   ? ipratropium bromide (ATROVENT) 21 mcg (0.03 %) nasal spray Apply two sprays to each nostril as directed every 12 hours.   ? metOLazone (ZAROXOLYN) 2.5 mg tablet Take one tablet by mouth daily as needed. PER OUTPATIENT CARDIOLOGY PROVIDER AS NEEDED FOR FLUID RETENTION   ? Miscellaneous Medical Supply misc Vancomycin 1,500 mg IV every 24hours starting 06/25/20 x 4 days (14 total antibiotic days), please draw vancomycin level per your protocol at [facility]   ? Miscellaneous Medical Supply misc Rx: Please wrap legs with ACE bandage from toes as high up to the leg as possible bilaterally  Dx: Lymphedema   ? montelukast (SINGULAIR) 10 mg tablet Take one tablet by mouth at bedtime daily.   ? nitroglycerin (NITROSTAT) 0.4 mg tablet Place 0.4 mg under tongue every 5 minutes as needed for Chest Pain. Max of 3 tablets, call 911.   ? nystatin (MYCOSTATIN) 100,000 unit/g topical cream Apply  topically to affected area twice daily.   ? oxyCODONE (ROXICODONE) 5 mg tablet Take one tablet to two tablets by mouth every 8 hours as needed for Pain   ? pantoprazole DR (PROTONIX) 40 mg tablet Take one tablet by mouth twice daily.   ? polyethylene glycol 3350 (MIRALAX) 17 g packet Take two packets by mouth twice daily.   ? rivaroxaban (XARELTO) 20 mg tablet Take one tablet by mouth daily with dinner. Take with food.   ? semaglutide (OZEMPIC) 0.25 mg or 0.5 mg(2 mg/1.5 mL) injection PEN Inject one-quarter mg under the skin every 7 days.   ? senna/docusate (SENOKOT-S) 8.6/50 mg tablet Take one tablet by mouth twice daily.   ? sodium chloride (NEBUSAL) 3 % nebulizer solution Inhale 4 mL by mouth into the lungs twice daily. Dx:  J47.9   ? spironolactone (ALDACTONE) 25 mg tablet Take one tablet by mouth daily. Take with food.   ? tamsulosin (FLOMAX) 0.4 mg capsule TAKE (1) CAPSULE BY MOUTH ONCE DAILY.   ? traMADoL (ULTRAM) 50 mg tablet Take one tablet by mouth every 8 hours as needed for Pain.   ? zinc sulfate 220 mg (50 mg elemental zinc) capsule Take 220 mg by mouth daily.     Vitals:    07/20/20 1420   BP: 100/60   BP Source: Arm, Left Upper   Pulse: 97   SpO2: 97%   PainSc: Zero   Weight: 131.5 kg (290 lb)   Height: 180.3 cm (5' 11)       Body mass index is 40.45 kg/m?[name] Physical Exam  Vitals and nursing note reviewed.   Constitutional:       General: He is not in acute distress.     Appearance: He is well-developed. He is not diaphoretic.   HENT:      Head: Normocephalic and atraumatic.   Neck:      Thyroid: No thyromegaly.      Vascular: No JVD.  Cardiovascular:      Rate and Rhythm: Normal rate and regular rhythm.      Heart sounds: Normal heart sounds. No murmur heard.    No friction rub. No gallop.   Pulmonary:      Effort: Pulmonary effort is normal. No respiratory distress.      Breath sounds: Normal breath sounds. No wheezing or rales.   Musculoskeletal:      Right lower leg: No edema.      Left lower leg: No edema.   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    Neurological:      Mental Status: He is alert.         Labwork reviewed:  Lab Results   Component Value Date/Time    HGBA1C 6.7 (H) 04/04/2020 05:40 AM    HGBA1C 5.9 11/25/2019 02:45 PM    HGBA1C 5.9 09/02/2019 09:50 AM    HGBPOC 14.3 04/08/2020 06:58 AM    HGBPOC 11.2 (L) 10/25/2012 10:58 AM    HGBPOC 12.9 (L) 10/25/2012 08:55 AM    MCALBR 7.2 05/18/2020 01:45 PM    TSH 2.49 03/05/2020 08:40 PM    FREET4R 0.99 12/15/2014 09:12 AM    CHOL 161 04/04/2020 05:40 AM    TRIG 152 (H) 04/04/2020 05:40 AM    HDL 53 04/04/2020 05:40 AM    LDL 87 04/04/2020 05:40 AM    NA 144 06/15/2020 02:50 PM    K 3.4 (L) 06/15/2020 02:50 PM    CL 102 06/15/2020 02:50 PM    CO2 28 06/15/2020 02:50 PM    GAP 14 (H) 06/15/2020 02:50 PM    BUN 37 (H) 06/15/2020 02:50 PM    CR 1.42 (H) 06/15/2020 02:50 PM    GLU 112 (H) 06/15/2020 02:50 PM    CA 7.2 (L) 06/15/2020 02:50 PM    PO4 4.1 03/08/2020 03:51 AM    ALBUMIN 3.8 06/15/2020 02:50 PM    TOTPROT 6.0 06/15/2020 02:50 PM    ALKPHOS 67 06/15/2020 02:50 PM    AST 13 06/15/2020 02:50 PM    ALT 9 06/15/2020 02:50 PM    TOTBILI 0.8 06/15/2020 02:50 PM    GFR 44 01/30/2020 12:00 AM    GFRAA >60 12/03/2019 04:51 AM    PSA 2.96 11/25/2018 12:00 AM Assessment and Plan:    1. Syncope, unspecified syncope type    2. Essential hypertension    3. ICD (implantable cardioverter-defibrillator), biventricular, in situ    4. Ischemic cardiomyopathy    5. Class 3 severe obesity due to excess calories with serious comorbidity and body mass index (BMI) of 40.0 to 44.9 in adult (HCC)    6. Type 2 diabetes mellitus with hyperglycemia, without long-term current use of insulin (HCC)      His syncopal event seems most consistent with orthostatic hypotension.  I want to start by lowering his spironolactone to 25 mg once daily.  I will get in touch with his cardiology team to see if there have been any concerning underlying arrhythmias on his ICD/pacemaker.  I think we should have a lenient blood pressure target of less than 140/90.  He appears well compensated from a heart failure standpoint, so I did not make any other adjustments to his heart failure regimen.    We also talked a lot about his obesity as well as type 2 diabetes.  His blood sugar has been doing better overall.  We are going to start him  on some macula tied.  My hope is that we can get him off of insulin entirely over time.    Patient Instructions     Future Appointments   Date Time Provider Department Center   07/28/2020 11:00 AM [facility] Radiology   08/05/2020  1:30 PM Pearson, Shela Commons, APRN-NP [facility] CVM Exam   08/11/2020  2:20 PM Joanie Coddington, MD [facility] SPINE   08/16/2020  4:00 PM MAC REMOTE MONITORING MACREMOTEHRM CVM Procedur   08/20/2020 10:20 AM Hyman Bower A, DO [facility] IM   08/27/2020 10:40 AM Deveron Furlong, MD [facility] IM   09/16/2020  4:00 PM MAC REMOTE MONITORING MACREMOTEHRM CVM Procedur   10/18/2020  4:00 PM MAC REMOTE MONITORING MACREMOTEHRM CVM Procedur   10/27/2020  1:30 PM Donnelly Stager, MD [facility] CVM Exam   10/27/2020  2:30 PM PAC ROOM 7 PRE None   11/08/2020  8:00 AM [place] [facility] CATH CLE [facility] CVM Procedur   11/18/2020  4:00 PM MAC REMOTE MONITORING MACREMOTEHRM CVM Procedur   12/20/2020  4:00 PM MAC REMOTE MONITORING MACREMOTEHRM CVM Procedur   01/20/2021  4:00 PM MAC REMOTE MONITORING MACREMOTEHRM CVM Procedur   02/21/2021  4:00 PM MAC REMOTE MONITORING MACREMOTEHRM CVM Procedur   03/24/2021  4:00 PM MAC REMOTE MONITORING MACREMOTEHRM CVM Procedur   04/25/2021  4:00 PM MAC REMOTE MONITORING MACREMOTEHRM CVM Procedur   05/26/2021  4:00 PM MAC REMOTE MONITORING MACREMOTEHRM CVM Procedur   06/02/2021  1:00 PM [name], Lenice Llamas, PA-C [facility] U[facility]   06/27/2021  4:00 PM MAC REMOTE MONITORING MACREMOTEHRM CVM Procedur       If you are on Mychart, you will now be able to read your clinic note from me.  My hope is that this will improve your engagement and insight into your health care and improve the accuracy of your medical record.  If you find inaccuracies, I apologize.  Please bring any concerns or inaccuracies to your next appointment.  Please remember that medical language and documentation is very different from how you and I speak and our dictation software is not perfect.    General Instructions:  ? How to reach me:   Please send a MyChart message to the General Medicine clinic or call Wynona Canes at [phone].    ? How to get a medication refill:  Please use the MyChart Refill request or contact your pharmacy directly to request medication refills. Please allow 48 hours.     ? How to receive your test results:  If you have signed up for MyChart, you will receive your test results and messages from me this way.  Otherwise, you will get a phone call or letter.   If you are expecting results and have not heard from my office within 2 weeks of your testing, please send a MyChart message or call my office.     ? Scheduling:  Our Scheduling phone number is [phone].  Same Day appointments are usually available. Please ask for an annual or yearly physical appointment if it has been over 1 year since our last appointment.  ? Appointment Reminders on your cell phone: Make sure we have your cell phone number, and Text [place] to [phone].  ? Support groups for many chronic illnesses are available through [facility]: [email or web address] or [phone].             Return in about 2 months (around 09/19/2020) for Schedule for Medicare Wellness, Follow up with NP: Dionne Milo.

## 2020-07-21 ENCOUNTER — Encounter: Admit: 2020-07-21 | Discharge: 2020-07-21 | Payer: MEDICARE

## 2020-07-23 ENCOUNTER — Encounter: Admit: 2020-07-23 | Discharge: 2020-07-23 | Payer: MEDICARE

## 2020-07-23 DIAGNOSIS — Z959 Presence of cardiac and vascular implant and graft, unspecified: Secondary | ICD-10-CM

## 2020-07-23 NOTE — Telephone Encounter
-----   Message from Vena Austria, MD sent at 07/23/2020  1:28 PM CDT -----  Has Ed had any remote transmissions?    ----- Message -----  From: [name], MD  Sent: 07/20/2020   3:07 PM CDT  To: Vena Austria, MD, Arneta Cliche, MD    Hi Marliss Coots,     I just saw Ed in the general medicine clinic.  He has had 2 syncopal events, the circumstances of which sound very consistent with orthostatic hypotension.  However, given his significant cardiac history, I want a make sure that there are no underlying cardiac issues driving this.  Have either review received any reports of arrhythmias or other concerns from his ICD/pacemaker?    I did lower his spironolactone to 25 mg daily.    Thanks.   Corliss Blacker

## 2020-07-23 NOTE — Telephone Encounter
I spoke with Ruben Wood - he is not sure how to send in a remote monitoring report.  I told him that I would send him information on a number to call for the [facility] customer service - patient states he will get on mychart to look at this.

## 2020-07-28 ENCOUNTER — Encounter: Admit: 2020-07-28 | Discharge: 2020-07-28 | Payer: MEDICARE

## 2020-07-28 NOTE — Telephone Encounter
VM received from [place] in [facility] regarding pt's HTS.  They are unable to obtain 3% but they have 3.5% in stock.  Routing to [employer], APRN-NP to advise if okay to change?  Shearon Stalls, RN

## 2020-07-30 ENCOUNTER — Encounter: Admit: 2020-07-30 | Discharge: 2020-07-30 | Payer: MEDICARE

## 2020-07-30 MED FILL — ARFORMOTEROL 15 MCG/2 ML IN NEBU: 15 mcg/2 mL | RESPIRATORY_TRACT | 30 days supply | Qty: 120 | Fill #3 | Status: AC

## 2020-07-30 MED FILL — BUDESONIDE 0.25 MG/2 ML IN NBSP: 0.25 mg/2 mL | RESPIRATORY_TRACT | 30 days supply | Qty: 120 | Fill #3 | Status: AC

## 2020-07-30 NOTE — Telephone Encounter
Emelia Salisbury called with [facility] requesting orders to include diagnosis code for vancomycin treatment that was provided in May for patient ordered by Dr. Crecencio Mc at [facility] as this is needed for billing.    I returned call requesting original orders be faxed back to [place] so diagnosis code could be added.    Provided our fax number.    Once received and completed they would like faxed back to them at [phone]    Etta Grandchild, RN

## 2020-08-02 ENCOUNTER — Encounter: Admit: 2020-08-02 | Discharge: 2020-08-02 | Payer: MEDICARE

## 2020-08-03 ENCOUNTER — Encounter: Admit: 2020-08-03 | Discharge: 2020-08-03 | Payer: MEDICARE

## 2020-08-03 MED FILL — RXAMB AMITR/GABAPEN/EMU OIL 4/4/10% CREAM (COMPOUND): TOPICAL | 34 days supply | Qty: 4 | Fill #2 | Status: AC

## 2020-08-04 ENCOUNTER — Encounter: Admit: 2020-08-04 | Discharge: 2020-08-04 | Payer: MEDICARE

## 2020-08-05 ENCOUNTER — Encounter: Admit: 2020-08-05 | Discharge: 2020-08-05 | Payer: MEDICARE

## 2020-08-05 ENCOUNTER — Ambulatory Visit: Admit: 2020-08-05 | Discharge: 2020-08-05 | Payer: MEDICARE

## 2020-08-05 DIAGNOSIS — N183 Stage 3 chronic kidney disease, unspecified whether stage 3a or 3b CKD (HCC): Secondary | ICD-10-CM

## 2020-08-05 DIAGNOSIS — R0600 Dyspnea, unspecified: Secondary | ICD-10-CM

## 2020-08-05 DIAGNOSIS — J479 Bronchiectasis, uncomplicated: Secondary | ICD-10-CM

## 2020-08-05 DIAGNOSIS — I2510 Atherosclerotic heart disease of native coronary artery without angina pectoris: Secondary | ICD-10-CM

## 2020-08-05 DIAGNOSIS — I5042 Chronic combined systolic (congestive) and diastolic (congestive) heart failure: Secondary | ICD-10-CM

## 2020-08-05 DIAGNOSIS — J189 Pneumonia, unspecified organism: Secondary | ICD-10-CM

## 2020-08-05 DIAGNOSIS — I255 Ischemic cardiomyopathy: Secondary | ICD-10-CM

## 2020-08-05 DIAGNOSIS — D649 Anemia, unspecified: Secondary | ICD-10-CM

## 2020-08-05 DIAGNOSIS — Z9981 Dependence on supplemental oxygen: Secondary | ICD-10-CM

## 2020-08-05 DIAGNOSIS — Z8679 Personal history of other diseases of the circulatory system: Secondary | ICD-10-CM

## 2020-08-05 DIAGNOSIS — I509 Heart failure, unspecified: Secondary | ICD-10-CM

## 2020-08-05 DIAGNOSIS — U071 Pneumonia due to COVID-19 virus: Secondary | ICD-10-CM

## 2020-08-05 DIAGNOSIS — Z95828 Presence of other vascular implants and grafts: Secondary | ICD-10-CM

## 2020-08-05 DIAGNOSIS — I951 Orthostatic hypotension: Secondary | ICD-10-CM

## 2020-08-05 DIAGNOSIS — I714 Abdominal aortic aneurysm, without rupture: Secondary | ICD-10-CM

## 2020-08-05 DIAGNOSIS — R609 Edema, unspecified: Secondary | ICD-10-CM

## 2020-08-05 DIAGNOSIS — Z8614 Personal history of Methicillin resistant Staphylococcus aureus infection: Secondary | ICD-10-CM

## 2020-08-05 DIAGNOSIS — N401 Enlarged prostate with lower urinary tract symptoms: Secondary | ICD-10-CM

## 2020-08-05 DIAGNOSIS — I480 Paroxysmal atrial fibrillation: Secondary | ICD-10-CM

## 2020-08-05 DIAGNOSIS — I10 Essential (primary) hypertension: Secondary | ICD-10-CM

## 2020-08-05 DIAGNOSIS — E782 Mixed hyperlipidemia: Secondary | ICD-10-CM

## 2020-08-05 DIAGNOSIS — K219 Gastro-esophageal reflux disease without esophagitis: Secondary | ICD-10-CM

## 2020-08-05 DIAGNOSIS — J302 Other seasonal allergic rhinitis: Secondary | ICD-10-CM

## 2020-08-05 DIAGNOSIS — M961 Postlaminectomy syndrome, not elsewhere classified: Secondary | ICD-10-CM

## 2020-08-05 DIAGNOSIS — I429 Cardiomyopathy, unspecified: Secondary | ICD-10-CM

## 2020-08-05 DIAGNOSIS — L03116 Cellulitis of left lower limb: Secondary | ICD-10-CM

## 2020-08-05 DIAGNOSIS — J45909 Unspecified asthma, uncomplicated: Secondary | ICD-10-CM

## 2020-08-05 DIAGNOSIS — R053 Chronic cough: Secondary | ICD-10-CM

## 2020-08-05 DIAGNOSIS — M954 Acquired deformity of chest and rib: Secondary | ICD-10-CM

## 2020-08-05 DIAGNOSIS — J449 Chronic obstructive pulmonary disease, unspecified: Secondary | ICD-10-CM

## 2020-08-05 DIAGNOSIS — G4733 Obstructive sleep apnea (adult) (pediatric): Secondary | ICD-10-CM

## 2020-08-05 LAB — CBC
MCHC: 32 g/dL (ref 32.0–36.0)
PLATELET COUNT: 268 K/UL — ABNORMAL HIGH (ref 150–400)
RBC COUNT: 3.2 M/UL — ABNORMAL LOW (ref 4.4–5.5)
WBC COUNT: 9.9 K/UL — ABNORMAL LOW (ref 4.5–11.0)

## 2020-08-05 LAB — BASIC METABOLIC PANEL
CALCIUM: 8.5 mg/dL (ref 8.5–10.6)
CHLORIDE: 100 MMOL/L — ABNORMAL HIGH (ref 98–110)
CO2: 31 MMOL/L — ABNORMAL HIGH (ref 21–30)
CREATININE: 1.3 mg/dL — ABNORMAL HIGH (ref 0.4–1.24)
EGFR: 53 mL/min — ABNORMAL LOW (ref >60)
GLUCOSE,PANEL: 84 mg/dL — ABNORMAL HIGH (ref 70–100)
POTASSIUM: 4.2 MMOL/L — ABNORMAL LOW (ref 3.5–5.1)
SODIUM: 142 MMOL/L — ABNORMAL LOW (ref 137–147)

## 2020-08-05 LAB — IRON + BINDING CAPACITY + %SAT+ FERRITIN
% SATURATION: 11 % — ABNORMAL LOW (ref 28–42)
IRON BINDING: 389 ug/dL — ABNORMAL HIGH (ref 270–380)
IRON: 43 ug/dL — ABNORMAL LOW (ref 50–185)

## 2020-08-05 MED ORDER — SPIRONOLACTONE 25 MG PO TAB
25 mg | ORAL_TABLET | Freq: Two times a day (BID) | ORAL | 3 refills | 46.00000 days | Status: AC
Start: 2020-08-05 — End: ?

## 2020-08-05 NOTE — Telephone Encounter
Received a VM from Lillie at [facility] requesting patients diagnoses for recent orders they received for wound care and IV Vancomycin. Faxed LOV note to number provided.  Lind Covert, RN

## 2020-08-05 NOTE — Progress Notes
Date of Service: 08/05/2020    Ruben Wood. Ruben Wood is a 80 y.o. male.   He follows with Dr. Vanetta Shawl.    HPI     Ruben Wood. is seen today in the heart failure clinic in a follow-up visit.  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     Ruben Wood was hospitalized at [facility] 2/11-04/26/2020 for acute asthma exacerbation who presented again?with progressive shortness of breath and cough that did not improve with 5 day doxycycline course, ultimately?requiring transfer to the ICU on 2/17.?PTA diuretic regimen was increased and patient was started on?antipseudomonal?antibiotic course. He was subsequently weaned off comfort flow and respiratory status stable on HFNC approaching baseline O2 requirement.?Diuretics back to PTA dose.  He was started on anticoagulation with Xarelto for his PAF.  He was treated for left lower extremity cellulitis just prior to discharge and was discharged with Keflex to finish 7 days. He was discharged to home with PT.    Patient's wife did report on 3/18 that patient had admission to [facility] 3/13-3/17 for epistaxis.  His Xarelto was held x4 days and then resumed.  He was treated with IV and oral antibiotics for possible aspiration pneumonia.    He did meet with Dr. Betti Cruz on 4/11 to discuss watchman LAA closure device placement.  He believes that the patient is a good candidate and the patient does wish to proceed.  The procedure is currently scheduled for 11/08/2020 with preprocedure visit with Dr. Betti Cruz on 10/27/2020.    He was most recently seen in the heart failure clinic by Dr. Vanetta Shawl on 06/15/2020 when he reported taking his spironolactone twice daily.  No changes were made medications.    We did receive a message from his PCP, Dr. Crecencio Mc, on 5/31 reporting that the patient had 2 syncopal events suggestive of orthostatic hypotension.  Remote device interrogation revealed some very brief NSVT but nothing that would have led to syncope. He reduced spironolactone to once a day (but pt reports today he forgot to reduce to daily).     On 6/6 Dr. Crecencio Mc treated pt for asthma exacerbation and treated with prednisone taper over 20 days.     Ruben Wood. presents today accompanied by his wife, Ruben Wood.  He reports that he has not had any further episodes of orthostatic hypotension/near syncope.  His shortness of breath and dyspnea on exertion have improved with the prednisone taper.  He has approximately 4-5 more days left of prednisone tabs.  He does find that he has been eating more while on prednisone.  He continues to attend physical therapy.  His lower extremity edema comes and goes and he has noticed that he is more in the left lower extremity versus the right.  He has had no further blistering on the left lower extremity.  He did use 1 dose of the as needed metolazone at the time that he had the blistering back around 6/6.  He tells me that he is continue to take his spironolactone twice daily rather than reducing to once daily as instructed by Dr. Crecencio Mc at the end of May.  He states that he simply forgot to  reduce the dose.           Vitals:    08/05/20 1310   BP: 134/80   BP Source: Arm, Left Upper   Pulse: 95   SpO2: 95%   O2 Device: Nasal cannula   O2 Liter Flow: 2 Lpm   PainSc: Zero   Weight: 135.8 kg (299 lb 6.4 oz)   Height: 180.3 cm (5' 11)     Body mass index is 41.76 kg/m?[name]     Past Medical History  Patient Active Problem List    Diagnosis Date Noted   ? Complex care coordination 05/04/2017     Priority: High     Class: Acute   ? Peripheral vascular disease (HCC) 06/22/2020   ? Epistaxis 05/14/2020     Was a problem after starting anticoagulation 04/2020, but this has been an ongoing issue for him. On O2 via NC at baseline.         ? Gout 04/03/2020   ? Dyspnea 04/02/2020   ? 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 Chronic bronchitis (HCC) 11/25/2019   ? Chronic bronchitis with acute exacerbation (HCC) 12/05/2018   ? 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 Type 2 diabetes mellitus with hyperglycemia, without long-term current use of insulin (HCC) 03/12/2018   ? Atrial fibrillation (HCC) 03/12/2018   ? Orthostatic hypotension 12/28/2017   ? Chronic respiratory failure with hypercapnia (HCC) 11/16/2017   ? Depression 11/15/2017   ? OAB (overactive bladder)      Urinary frequency, urgency, urge urinary incontinence (UUI), nocturia, nocturnal enuresis.  -- trial Mirabegron 25 mg --> improved, but persistent sx's.  -- trial Mirabegron 50 mg.     ? Urinary incontinence, urge      See OAB A&P note.     ? Coronary artery disease due to lipid rich plaque 06/29/2017   ? Bronchiectasis with acute lower respiratory infection (HCC) 05/10/2017   ? 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     ? Benign prostatic hyperplasia (BPH) 03/01/2017     TRUS Prostate (03/01/2017): Prostate volume = 38.3 mL.  D/c'd Tamsulosin d/t lack of symptom improvement.     ? Enrolled in chronic care management 02/28/2017   ? History of elevated prostate specific antigen (PSA)      PNBx (03/01/2017): (L) high-grade prostatic intraepithelial neoplasia (HG PIN), 1/6 cores; [name], PA-C.     ? Spondylolisthesis, lumbar region 12/13/2016   ? Lumbar post-laminectomy syndrome      L4-5     ? Spinal stenosis of lumbosacral region 09/20/2016   ? Spondylolisthesis of lumbosacral region 09/20/2016   ? Osteoarthritis of spine with radiculopathy, lumbosacral region 09/20/2016   ? 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 He had a normal response to pneumococca vaccination; he had 17/23 positive serotypes. He has normal tetanus toxoid Ab and CH50.  His T&B cell panel revealed a mildly low B-cell count at 68 right after a time of acute illness.  He had positive diphtheria antibody.    Likely multifactorial with significant comorbidities including frequent steroid use for bronchiectasis exacerbations, frequent infections, and possibly a nutritional component (low total protein).    Previously on doxycycline once daily for recurrent  infections, which he initially felt had been tremendously helpful, but he has had a couple episodes of what may be pneumonia in early 2022 when he was off.     Plan:  - Restart doxycycline 100 mg daily.  - Recommend getting the Pneumovax immunization next time he is in clinic in person and will need repeat pneumococcal antibody levels 1 month afterwards once he has had resolution of his COVID issues.          ? Moderate persistent asthma with acute exacerbation 02/25/2016     He has had asthma since sometime in his 20-30s. He gets cough, chest tightness, wheezing, shortness of breath that is year round with worsening in the Spring and Fall.   Triggers: URI, hot/cold air. Additionally he has been diagnosed with bronchiectasis and emphysema; his PFTs suggest both obstructive and restrictive disease and asthma is unlikely to be the sole cause of his dyspnea.    He has negative IgE immunocaps to aeroallergens which makes the possibility of allergic asthma highly unlikely. His IgE level was 17 and he had negative ANCAs, MPO and PR3.     Repeatedly being treated with steroids now for flares.  Also on hypertonic saline nebs, DuoNebs, budesnoide/Brovana via neb, and Singulair as well as aggressive pulmonary clearance with vest and Aerobika.        ? 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 He is not having issues with urticaria.    - We recommend IgE immunocaps to aeroallergens.      ? Recurrent infections 02/25/2016     Recurrent sinopulmonary infections requiring antibiotics 5-10 times per year.   Cellulitis and non-healing wounds.   Many underlying illnesses predisposing him to these infections in addition to intermittent hypogammaglobulinemia associated with active infections and systemic steroids.       ? S/P left pulmonary artery pressure sensor implant placement (CardioMEMs)  02/02/2016     * Patient has CardioMEMs (Pulmonary Artery Pressure Sensor)* Please call Matthew Folks, CardioMEMs Program Coordinator [phone] or page Heart Failure Rounding Team if patient is admitted or presents to Ruben Wood*  03/30/15 implant     ? Ischemic cardiomyopathy 01/12/2016   ? ICD (implantable cardioverter-defibrillator), biventricular, in situ 12/07/2015   ? Hypercholesterolemia 11/25/2015   ? 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 Bronchiectasis without complication (HCC) 06/18/2015     Non-sputum producer.  Currently on Symbicort.  - Dr. [name] was able to get him a vest to wear at home for secretions and this has helped immensely with his secretions and breathing.      ? Cellulitis of left lower extremity 11/12/2014     10/2014: admitted with cellulitis, [place] negative for venous clot, MRI without osteomyelitis.      11/12/2014 In clinic today, still with cellulitis.  Per patient and his wife, the erythema may be extending.  Cultures from drainage in hospital with MSSA, but Blood Cx negative.  No e/o osteomyelitis.  My concern is that this antibiotic regimen is not adequately treating his cellulitis.  We will broaden coverage to get MRSA with doxycycline.  Patient and wife given strict call/return criteria.  I will  see patient in 3-4 days for re-evaluation.  No fever today.   Plan:  Stop cefpodoxime and start doxycylcine    11/17/2014 Much better, no warmth and erythema minimal.    Plan: continue doxycyline for full 10 day course.        ? Osteoarthritis of spine with radiculopathy, lumbar region 07/28/2014   ? Acute on chronic combined systolic and diastolic congestive heart failure, NYHA class 2 (HCC) 07/19/2014     11/04/14: EF 20%, severely dilated LV with grade 1 diastolic dysfunction.  11/12/2014 In clinic today, patient appears fairly well compensated.  He does have some LEE, but no significant rales (just some at bases), no JVD sitting upright, and no sx to suggest decompensation.  We will continue metoprolol, spironolactone, and bumex at current doses.    11/17/2014 Edema much improved in lower ext, Cr trending down on last labs.  Will recheck labs today.  Advised to weigh himself daily.  Rechecking BMP today to ensure not overdoing diuretics.     On metoprolol, spironolactone.  Will need to explore allergy to ARB more as patient may benefit from ACEI.      03/30/15 CardioMEMS implant by Dr. Chales Abrahams  1. Normal cardiac output and cardiac index.  2. Normal pulmonary pressures and normal pulmonary capillary wedge pressure.  3. Successful insertion of CardioMEMS pulmonary artery pressure sensor     ? 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      ? Stage 3 chronic kidney disease (HCC) 08/26/2013   ? Mixed dyslipidemia 08/26/2013     Hepatic Function    Lab Results   Component Value Date/Time    ALBUMIN 4.0 11/04/2014 12:26 AM    TOTAL PROTEIN 6.9 11/04/2014 12:26 AM    ALK PHOSPHATASE 61 11/04/2014 12:26 AM    Lab Results   Component Value Date/Time    AST (SGOT) 41* 11/04/2014 12:26 AM    ALT (SGPT) 19 11/04/2014 12:26 AM    TOTAL BILIRUBIN 1.4* 11/04/2014 12:26 AM        Lab Results   Component Value Date    CHOL 148 12/15/2013    TRIG 122 12/15/2013    HDL 51 12/15/2013    LDL 88 12/15/2013    VLDL 24 12/15/2013    NONHDLCHOL 97 12/15/2013       BP Readings from Last 3 Encounters:   11/12/14 129/80   11/09/14 111/44   11/04/14 146/65     Plan:   80 y.o. with known CAD with CTO of RCA and obtuse marginal branch with high grade stenosis (90%) in mid left circ.    Advised heart healthy diet and daily exercise.    Continue high intensity statin, atorvastatin       ? AAA (abdominal aortic aneurysm) (HCC) 10/15/2012     Duplex done at OSH in 2007 and 2008 ~ 4.1 cm    Duplex 10/15/12-AAA 7.3 cm AP x 7.3 cm       ? History of MRSA infection 10/14/2012   ? CAD (coronary artery disease), native coronary artery 08/15/2012     07/11/2002- Cath @ [facility] showed Severe double vessel disease(OMB of circumflex and distal circ)  inferior-basilar dyskinesis.                  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    09/12/12   Cath - [place]:  CTO of the right coronary artery, CTO of the obtuse marginal branch and high-grade stenosis of   90% in the mid left circumflex artery No significant stenosis in the left anterior descending artery or left main vessel     Failed attempt in opening 2nd obtuse marginal CTO as described above.    10/14/12: Unsuccessful attempt to open CTO of OMB and mid-CFX by Dr. Mackey Birchwood      04/02/17: Cath by Dr. Steward Ros:   4. Chronic total occlusion of the circumflex.  5. Chronic total occlusion of the right coronary artery, status post percutaneous coronary intervention with drug-eluting stent times 2.  6. Small distal right coronary artery perforation without echo or hemodynamic evidence of compromise.    06/29/17-cardiac catheterization by Dr. Steward Ros. One DES placed to chronic total occlusion of the circumflex artery. The RCA stent was patent.     ? OSA on CPAP 08/13/2012     CPAP at  DME: [facility]    Split night:  08/14/2017  AHI 43.2  REM n/a  Time below 88 %: 98 mins  Lowest sat: 83%     CPAP 10 cm h20 effective      ? COPD (chronic obstructive pulmonary disease) (HCC) 08/13/2012     04/28/13: PFTs with more restrictive pattern with FEV1 50%, FEV1/FVC 89%, DLCO 65%.    12/27/13: PFTs with FEV1 82%, FEV1/FVC 113%, and DLCO 77%     ? 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  Discussed the importance of diet.  Exercise as tolerated.        ? Essential hypertension 08/13/2012   ? Chest wall deformity 07/09/2012     Chest wall reconstruction on 07/09/12  Lung herniation- bio bridge         ROS-Per HPI    Physical Exam  General Appearance: In NAD, obese  Neck Veins: normal JVP, neck veins are not distended; no HJR   Chest Inspection: chest is normal in appearance   Respiratory Effort: breathing comfortably, no respiratory distress   Auscultation/Percussion: lungs diminished but clear to auscultation, faint chronic bilat rales, no rhonchi or wheezing   Cardiac Rhythm: regular rhythm and normal rate   Cardiac Auscultation: S1, S2 normal, no rub, no definite S3  or S4   Murmurs: no murmur   Peripheral Circulation: normal peripheral circulation   Pedal Pulses: normal symmetric pedal pulses   Lower Extremity Edema: Trace-1+ lower extremity edema, venous discoloration  Abdominal Exam: soft, non-tender, no obvious masses, bowel sounds normal   Gait & Station: Seated in a wheelchair  Orientation: oriented to person, place and time   Affect & Mood: appropriate and sustained affect   Language and Memory: patient responsive and seems to comprehend information   Neurologic Exam: neurological assessment grossly intact  Vital signs were reviewed    Cardiovascular Studies    Echocardiogram?11/26/19: LV severely dilated, severe predominantly basal septal hypertrophy, LV EF 35%, unable to assess diastolic function.  RV moderately dilated with normal wall thickness and systolic function.  IVS 1.79 cm.  LA moderately dilated, RA mildly dilated.  CVP 0-5 mmHg.  MVR moderate-severe, eccentric regurgitation, mean gradient 3-4 mmHg.  Mild TVR.  PASP 37 mmHg.    Cardiovascular Health Factors  Vitals BP Readings from Last 3 Encounters:   08/05/20 134/80   07/26/20 137/87   07/20/20 100/60     Wt Readings from Last 3 Encounters:   08/05/20 135.8 kg (299 lb 6.4 oz)   07/26/20 127.9 kg (282 lb)   07/20/20 131.5 kg (290 lb)     BMI Readings from Last 3 Encounters:   08/05/20 41.76 kg/m?   07/26/20 39.33 kg/m?   07/20/20 40.45 kg/m?      Smoking Social History     Tobacco Use   Smoking Status Former Smoker   ? Packs/day: 2.00   ? Years: 40.00   ? Pack years: 80.00   ? Types: Cigarettes   ? Quit date: 07/09/1998   ? Years since quitting: 22.0   Smokeless Tobacco Never Used      Lipid Profile Cholesterol   Date Value Ref Range Status   04/04/2020 161 <200 MG/DL Final     HDL   Date Value Ref Range Status   04/04/2020 53 >40 MG/DL Final     LDL   Date Value Ref Range Status   04/04/2020 87 <100 mg/dL Final     Triglycerides   Date Value Ref Range Status   04/04/2020 152 (H) <150 MG/DL Final      Blood Sugar Hemoglobin A1C Date Value Ref Range Status   04/04/2020 6.7 (H) 4.0 - 6.0 % Final     Comment:     The ADA recommends that most patients with type 1 and type 2 diabetes maintain   an A1c level <7%.       Glucose   Date Value Ref Range Status   06/15/2020 112 (H) 70 - 100 MG/DL Final   16/11/9602 540 (H) 70 - 100 MG/DL Final   98/12/9145 829 (H) 70 - 100 MG/DL Final     Glucose Fasting   Date Value Ref Range Status   12/15/2014 111  Final     Glucose, POC   Date Value Ref Range Status   04/26/2020 172 (H) 70 - 100 MG/DL Final   56/21/3086 578 (H) 70 - 100 MG/DL Final   46/96/2952 841 (H) 70 - 100 MG/DL Final          Problems Addressed Today  Encounter Diagnoses   Name Primary?   ? Chronic combined systolic (congestive) and diastolic (congestive) heart failure (HCC) Yes   ? Orthostatic hypotension    ? Stage 3 chronic kidney disease, unspecified whether stage 3a or 3b CKD (HCC)    ? Paroxysmal atrial fibrillation (HCC)    ? Ischemic cardiomyopathy    ? Anemia, unspecified type        Assessment and Plan     ?  Chronic?combined systolic and diastolic?HFrEF, ?EF: 35%.  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 ?RV moderately dilated with normal wall thickness and systolic function. ?IVS 1.79 cm. ?LA moderately dilated, RA mildly dilated. ?CVP 0-5 mmHg. ?MVR moderate-severe, eccentric  regurgitation, mean gradient 3-4 mmHg. ?Mild TVR. ?PASP 37 mmHg.  - 12/02/19 RHC shows RA 9, RV 37/8, PA 33/19 (24), PA sat 63%, PCWP 14, CO/CI (Fick) 6.51/2.58, CO/CI (TDL) 7.9/3.14, TPG 10, PVR 1.5, SVR 1056  -NYHA class III, stage C.  The patient's weight on the office scale today is up 11 lbs since his last heart failure clinic visit.  The patient appears euvolemic on examination.  -Diuretic: Continue taking Bumex 3 mg twice daily.  He has metolazone 2.5 mg available as instructed.  - CardioMEMS in situ, and the PA diastolic goal 14-15 with threshold 12-17.  PAD is 14 today, his range has been 11-17 for the last 3 weeks.  GDMT PTA Changes   BB None-due to bronchiectasis ?   ACEI/ARB/ARNI N/A-renal dysfunction ?   Aldosterone Antagonist Spironolactone 25 mg bid     SGLT2 Inhibitor  Jardiance 25 mg daily     Hydralazine/Nitrate N/A ?   Ivabradine N/A ?   HRMT CRT-D ?   Anticoagulation for Afib/flutter N/A  ?   Cardiac Rehab ?patient participating in physical therapy ?   > BMP today    CKD, stage III  - Baseline creatinine 1.3-1.7  -Last creatinine 1.42 on 06/15/2020    Anemia  -Hemoglobin typically runs 10.7-11.2.  -Last iron panel 11/25/2019: Total iron 36, 11% saturation, TIBC 323, ferritin 147  > Repeat CBC and iron panel    Orthostatic hypotension  -Patient denies any further episodes since late May  > Continue physical therapy  ?  CRT-D in situ  - Placed 11/25/2015 by Dr. Betti Cruz  -Remote device interrogation 07/23/2020: BiV pacing 93%, AP 8.2%.  21 atrial high rate events, EGM suggest AT with duration of 2-20 seconds.  AT/AF burden 1%. ?6 episodes of NSVT lasting anywhere from 4-12 seconds.   ?  Paroxysmal AFib/Flutter  -Started on Xarelto 20 mg daily during hospitalization February-March 2022.    -He denies any further epistaxis or signs or symptoms of bleeding since discharge from [facility] on 3/17.  -Most recent device interrogation as above  - CHADs2VASc score 5 (HF,HTN, age, DM2)  -Patient met with Dr. Betti Cruz on 05/31/2020 to discuss watchman LAA closure device and he does wish to proceed.  Dr. Betti Cruz believes patient is a good candidate.  > Watchman LAA closure device scheduled for 11/08/2020 with preprocedure visit on 10/27/2020 with Dr. Betti Cruz  > Continue Xarelto 20 mg daily    ?  CAD  -?LHC 06/29/17: DES to the CTO left circumflex obtuse marginal-2 by Dr. Alden Server. ?Patent stent in proximal RCA, 30% distal left main stenosis with circumferential calcium noted on the OCT-?Prior left cardiac catheterization 04/02/17 with DES x2 to the RCA w/ small distal?RCA perforation, trace?pericardial effusion;?known total occlusion of the CX.??Left main and LAD have 30-40%?dz.?  >?Continue ASA 81 mg, atorvastatin 40 mg daily.  ?  PAD  - 10/7 ABI ?+ doppler BLE shows normal ABI to BLE  - 10/7 RLE arterial duplex shows 50-75% stenosis to mid R SFA with elevated velocity, turbulence and ratio  - 10/7 LLE arterial duplex shows 50-75% stenosis to L EIA, 50-75% stenosis to distal L CFA and proximal L SFA w/ elevated velocity and ratio along w/ monophasic waveform; focal stenosis unable to be ruled out  - 10/8 PV venous insufficiency BLE study shows no residual reflux   - pt established with Dr Elizabeth Palau; plan for conservative treatment with Jonne Ply and statin   ?  Chronic bronchitis/COPD?/bronchiectasis  Asthma exacerbation  Prior COVID-19 infection with hospitalization  Oxygen dependent   - Pt follows with Dr. Lambert Mody from Pulmonary team  - Wears 4 L O2 at baseline  > Next appointment with Dr. Lambert Mody 08/27/2020  ?  Morbid obesity:  - Body mass index is 41.76 kg/m?.  > Patient will continue with diet modification in an effort to lose weight  ?  OSA:> CPAP nightly    Follow-up: He will follow up with myself, Bunnie Philips, on 8/11    He has been encouraged to contact us prior to the next appointment with any questions, concerns, or worsening symptoms.    Thank you for allowing me to participate in the care of this patient. If you have any questions please do not hesitate to contact our office.      Bunnie Philips, ANP-BC, MSN, [facility] - CVM Advance Practice Provider  Heart Failure APP Coordinator  [facility] [place] [facility]   Phone [phone]  - Fax [phone] - [email or web address]  [address], [address], [place], [place] [address]  Collaborating Physician Dr. Harlow Mares         Total Time Today was 42 minutes in the following activities: Preparing to see the patient, Obtaining and/or reviewing separately obtained history, Performing a medically appropriate examination and/or evaluation, Counseling and educating the patient/family/caregiver, Ordering medications, tests, or procedures and Documenting clinical information in the electronic or other health record    Current Medications (including today's revisions)  ? acetaminophen (TYLENOL) 325 mg tablet Take two tablets by mouth every 6 hours as needed for Pain.   ? albuterol 0.083% (PROVENTIL) 2.5 mg /3 mL (0.083 %) nebulizer solution Inhale 3 mL solution by nebulizer as directed every 6 hours as needed for Wheezing or Shortness of Breath. Indications: asthma   ? albuterol sulfate (PROAIR HFA) 90 mcg/actuation HFA aerosol inhaler Inhale two puffs by mouth into the lungs every 4 hours as needed for Wheezing or Shortness of Breath.   ? allopurinoL (ZYLOPRIM) 300 mg tablet Take one tablet by mouth daily. Take with food.   ? AMITR/GABAPEN/EMU OIL 05/24/08% CREAM (COMPOUND) Apply topically to affected area three times daily as needed.   ? arformoteroL (BROVANA) 15 mcg/2 mL nebulizer solution Inhale 2 mL solution by nebulizer as directed twice daily. Dx: J44.9   ? ASCORBIC ACID-ASCORBATE SODIUM PO Take 500 mg by mouth every morning.   ? aspirin EC 81 mg tablet Take one tablet by mouth daily. Take with food.   ? atorvastatin (LIPITOR) 40 mg tablet Take one tablet by mouth daily.   ? benzonatate (TESSALON PERLES) 100 mg capsule Take one capsule by mouth every 8 hours.   ? budesonide (PULMICORT) 0.25 mg/2 mL nebulizer solution Inhale 2 mL solution by nebulizer as directed twice daily. Dx: J44.9   ? bumetanide (BUMEX) 1 mg tablet Take three tablets by mouth twice daily.   ? cholecalciferol (VITAMIN D-3) 1,000 units tablet Take 1,000 Units by mouth daily.   ? diclofenac sodium (VOLTAREN) 1 % topical gel Apply four g topically to affected area four times daily.   ? duloxetine DR (CYMBALTA) 60 mg capsule Take one capsule by mouth daily.   ? empagliflozin (JARDIANCE) 25 mg tablet Take one tablet by mouth daily.   ? finasteride (PROSCAR) 5 mg tablet Take one tablet by mouth daily.   ? fish oil- omega 3-DHA/EPA 300/1,000 mg capsule Take 1 capsule by mouth daily.   ? flash glucose scanning reader (FREESTYLE LIBRE) reader Use as directed.   ?  flash glucose sensor (FREESTYLE LIBRE 14 DAY SENSOR) sensor Type 2 diabetes  Indications: type 2 diabetes mellitus   ? gabapentin (NEURONTIN) 100 mg capsule Take four capsules by mouth twice daily.   ? insulin pen needles (disposable) (BD ULTRA-FINE MINI PEN NEEDLE) 31 gauge x 3/16 pen needle Use with insulin pens   ? ipratropium bromide (ATROVENT) 21 mcg (0.03 %) nasal spray Apply two sprays to each nostril as directed every 12 hours.   ? metOLazone (ZAROXOLYN) 2.5 mg tablet Take one tablet by mouth daily as needed. PER OUTPATIENT CARDIOLOGY PROVIDER AS NEEDED FOR FLUID RETENTION   ? Miscellaneous Medical Supply misc Vancomycin 1,500 mg IV every 24hours starting 06/25/20 x 4 days (14 total antibiotic days), please draw vancomycin level per your protocol at [facility].   ? Miscellaneous Medical Supply misc Rx: Please wrap legs with ACE bandage from toes as high up to the leg as possible bilaterally  Dx: Lymphedema   ? montelukast (SINGULAIR) 10 mg tablet Take one tablet by mouth at bedtime daily.   ? nitroglycerin (NITROSTAT) 0.4 mg tablet Place 0.4 mg under tongue every 5 minutes as needed for Chest Pain. Max of 3 tablets, call 911.   ? nystatin (MYCOSTATIN) 100,000 unit/g topical cream Apply  topically to affected area twice daily.   ? oxyCODONE (ROXICODONE) 5 mg tablet Take one tablet to two tablets by mouth every 8 hours as needed for Pain   ? pantoprazole DR (PROTONIX) 40 mg tablet Take one tablet by mouth twice daily.   ? polyethylene glycol 3350 (MIRALAX) 17 g packet Take two packets by mouth twice daily.   ? predniSONE (DELTASONE) 10 mg tablet Take four tablets by mouth daily for 5 days, THEN two tablets daily for 5 days, THEN one tablet daily for 5 days, THEN one-half tablet daily for 5 days.   ? rivaroxaban (XARELTO) 20 mg tablet Take one tablet by mouth daily with dinner. Take with food.   ? semaglutide (OZEMPIC) 0.25 mg or 0.5 mg(2 mg/1.5 mL) injection PEN Inject one-quarter mg under the skin every 7 days.   ? senna/docusate (SENOKOT-S) 8.6/50 mg tablet Take one tablet by mouth twice daily.   ? sodium chloride (HYPER-SAL) 3.5 % inhalation solution Inhale 4 mL by mouth into the lungs twice daily. 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 spironolactone (ALDACTONE) 25 mg tablet Take one tablet by mouth twice daily. Take with food.   ? tamsulosin (FLOMAX) 0.4 mg capsule TAKE (1) CAPSULE BY MOUTH ONCE DAILY.   ? traMADoL (ULTRAM) 50 mg tablet Take one tablet by mouth every 8 hours as needed for Pain.   ? zinc sulfate 220 mg (50 mg elemental zinc) capsule Take 220 mg by mouth daily.

## 2020-08-05 NOTE — Progress Notes
Daily CardioMEMS Readings    Goal PA Diastolic: 14-15 mmHg   PA Diastolic Thresholds: 12-17 mmHg

## 2020-08-06 ENCOUNTER — Encounter: Admit: 2020-08-06 | Discharge: 2020-08-06 | Payer: MEDICARE

## 2020-08-06 NOTE — Telephone Encounter
-----   Message from Glennis Brink, APRN-NP sent at 08/06/2020  5:06 PM CDT -----  Patient would benefit from IV iron supplementation based on most recent iron panel and CBC revealing iron deficiency anemia. Due to results from [facility] HF trial which found oral iron had little effect in replacing iron stores and did not improve peak exercise capacity, , or quality of life in anemic heart failure patients, will initiate Feraheme 510 mg x2 doses, at least 7 days apart. Pt may want to coordinate this with infusion clinic/hospital closer to his home.

## 2020-08-06 NOTE — Telephone Encounter
Called and left message with patient to see if he is agreeable to receive IV iron and if so offered the [facility] infusion or [facility] infusion option for him. Awaiting return call to discuss and will explore options closer to home if patient desires. Asked that pt MyChart or call clinic back to discuss.

## 2020-08-11 ENCOUNTER — Encounter: Admit: 2020-08-11 | Discharge: 2020-08-11 | Payer: MEDICARE

## 2020-08-11 ENCOUNTER — Ambulatory Visit: Admit: 2020-08-11 | Discharge: 2020-08-12 | Payer: MEDICARE

## 2020-08-11 DIAGNOSIS — J189 Pneumonia, unspecified organism: Secondary | ICD-10-CM

## 2020-08-11 DIAGNOSIS — R0600 Dyspnea, unspecified: Secondary | ICD-10-CM

## 2020-08-11 DIAGNOSIS — K219 Gastro-esophageal reflux disease without esophagitis: Secondary | ICD-10-CM

## 2020-08-11 DIAGNOSIS — M961 Postlaminectomy syndrome, not elsewhere classified: Secondary | ICD-10-CM

## 2020-08-11 DIAGNOSIS — I5042 Chronic combined systolic (congestive) and diastolic (congestive) heart failure: Secondary | ICD-10-CM

## 2020-08-11 DIAGNOSIS — J479 Bronchiectasis, uncomplicated: Secondary | ICD-10-CM

## 2020-08-11 DIAGNOSIS — Z8614 Personal history of Methicillin resistant Staphylococcus aureus infection: Secondary | ICD-10-CM

## 2020-08-11 DIAGNOSIS — Z8679 Personal history of other diseases of the circulatory system: Secondary | ICD-10-CM

## 2020-08-11 DIAGNOSIS — J302 Other seasonal allergic rhinitis: Secondary | ICD-10-CM

## 2020-08-11 DIAGNOSIS — I714 Abdominal aortic aneurysm, without rupture: Secondary | ICD-10-CM

## 2020-08-11 DIAGNOSIS — I2510 Atherosclerotic heart disease of native coronary artery without angina pectoris: Secondary | ICD-10-CM

## 2020-08-11 DIAGNOSIS — N183 CKD (chronic kidney disease) stage 3, GFR 30-59 ml/min (HCC): Secondary | ICD-10-CM

## 2020-08-11 DIAGNOSIS — E782 Mixed hyperlipidemia: Secondary | ICD-10-CM

## 2020-08-11 DIAGNOSIS — R053 Chronic cough: Secondary | ICD-10-CM

## 2020-08-11 DIAGNOSIS — I10 Essential (primary) hypertension: Secondary | ICD-10-CM

## 2020-08-11 DIAGNOSIS — M954 Acquired deformity of chest and rib: Secondary | ICD-10-CM

## 2020-08-11 DIAGNOSIS — U071 Pneumonia due to COVID-19 virus: Secondary | ICD-10-CM

## 2020-08-11 DIAGNOSIS — J45909 Unspecified asthma, uncomplicated: Secondary | ICD-10-CM

## 2020-08-11 DIAGNOSIS — M461 Sacroiliitis, not elsewhere classified: Secondary | ICD-10-CM

## 2020-08-11 DIAGNOSIS — R609 Edema, unspecified: Secondary | ICD-10-CM

## 2020-08-11 DIAGNOSIS — G4733 Obstructive sleep apnea (adult) (pediatric): Secondary | ICD-10-CM

## 2020-08-11 DIAGNOSIS — L03116 Cellulitis of left lower limb: Secondary | ICD-10-CM

## 2020-08-11 DIAGNOSIS — Z95828 Presence of other vascular implants and grafts: Secondary | ICD-10-CM

## 2020-08-11 DIAGNOSIS — I429 Cardiomyopathy, unspecified: Secondary | ICD-10-CM

## 2020-08-11 DIAGNOSIS — I509 Heart failure, unspecified: Secondary | ICD-10-CM

## 2020-08-11 DIAGNOSIS — N401 Enlarged prostate with lower urinary tract symptoms: Secondary | ICD-10-CM

## 2020-08-11 DIAGNOSIS — Z9981 Dependence on supplemental oxygen: Secondary | ICD-10-CM

## 2020-08-11 DIAGNOSIS — J449 Chronic obstructive pulmonary disease, unspecified: Secondary | ICD-10-CM

## 2020-08-11 NOTE — Telephone Encounter
Marylene Land with [facility] called requesting office visit note with supporting diagnosis for vancomycin infusion.    I faxed dictated note to [phone] attention Marylene Land as requested.    Confirmation of fax transmission received.    Etta Grandchild, RN

## 2020-08-12 ENCOUNTER — Encounter: Admit: 2020-08-12 | Discharge: 2020-08-12 | Payer: MEDICARE

## 2020-08-12 ENCOUNTER — Ambulatory Visit: Admit: 2020-08-12 | Discharge: 2020-08-12 | Payer: MEDICARE

## 2020-08-12 DIAGNOSIS — M961 Postlaminectomy syndrome, not elsewhere classified: Secondary | ICD-10-CM

## 2020-08-12 DIAGNOSIS — M954 Acquired deformity of chest and rib: Secondary | ICD-10-CM

## 2020-08-12 DIAGNOSIS — I714 Abdominal aortic aneurysm, without rupture: Secondary | ICD-10-CM

## 2020-08-12 DIAGNOSIS — U071 Pneumonia due to COVID-19 virus: Secondary | ICD-10-CM

## 2020-08-12 DIAGNOSIS — N401 Enlarged prostate with lower urinary tract symptoms: Secondary | ICD-10-CM

## 2020-08-12 DIAGNOSIS — I5042 Chronic combined systolic (congestive) and diastolic (congestive) heart failure: Secondary | ICD-10-CM

## 2020-08-12 DIAGNOSIS — I509 Heart failure, unspecified: Secondary | ICD-10-CM

## 2020-08-12 DIAGNOSIS — J449 Chronic obstructive pulmonary disease, unspecified: Secondary | ICD-10-CM

## 2020-08-12 DIAGNOSIS — Z95828 Presence of other vascular implants and grafts: Secondary | ICD-10-CM

## 2020-08-12 DIAGNOSIS — I429 Cardiomyopathy, unspecified: Secondary | ICD-10-CM

## 2020-08-12 DIAGNOSIS — L03116 Cellulitis of left lower limb: Secondary | ICD-10-CM

## 2020-08-12 DIAGNOSIS — G4733 Obstructive sleep apnea (adult) (pediatric): Secondary | ICD-10-CM

## 2020-08-12 DIAGNOSIS — I10 Essential (primary) hypertension: Secondary | ICD-10-CM

## 2020-08-12 DIAGNOSIS — M461 Sacroiliitis, not elsewhere classified: Secondary | ICD-10-CM

## 2020-08-12 DIAGNOSIS — N183 CKD (chronic kidney disease) stage 3, GFR 30-59 ml/min (HCC): Secondary | ICD-10-CM

## 2020-08-12 DIAGNOSIS — K219 Gastro-esophageal reflux disease without esophagitis: Secondary | ICD-10-CM

## 2020-08-12 DIAGNOSIS — I2510 Atherosclerotic heart disease of native coronary artery without angina pectoris: Secondary | ICD-10-CM

## 2020-08-12 DIAGNOSIS — E782 Mixed hyperlipidemia: Secondary | ICD-10-CM

## 2020-08-12 DIAGNOSIS — R609 Edema, unspecified: Secondary | ICD-10-CM

## 2020-08-12 DIAGNOSIS — R053 Chronic cough: Secondary | ICD-10-CM

## 2020-08-12 DIAGNOSIS — J302 Other seasonal allergic rhinitis: Secondary | ICD-10-CM

## 2020-08-12 DIAGNOSIS — R0600 Dyspnea, unspecified: Secondary | ICD-10-CM

## 2020-08-12 DIAGNOSIS — J189 Pneumonia, unspecified organism: Secondary | ICD-10-CM

## 2020-08-12 DIAGNOSIS — Z9981 Dependence on supplemental oxygen: Secondary | ICD-10-CM

## 2020-08-12 DIAGNOSIS — J479 Bronchiectasis, uncomplicated: Secondary | ICD-10-CM

## 2020-08-12 DIAGNOSIS — Z8614 Personal history of Methicillin resistant Staphylococcus aureus infection: Secondary | ICD-10-CM

## 2020-08-12 DIAGNOSIS — Z8679 Personal history of other diseases of the circulatory system: Secondary | ICD-10-CM

## 2020-08-12 DIAGNOSIS — J45909 Unspecified asthma, uncomplicated: Secondary | ICD-10-CM

## 2020-08-12 MED ORDER — IOHEXOL 240 MG IODINE/ML IV SOLN
2.5 mL | Freq: Once | EPIDURAL | 0 refills | Status: CP
Start: 2020-08-12 — End: ?
  Administered 2020-08-12: 20:00:00 1 mL via EPIDURAL

## 2020-08-12 MED ORDER — TRIAMCINOLONE ACETONIDE 40 MG/ML IJ SUSP
40 mg | Freq: Once | EPIDURAL | 0 refills | Status: CP
Start: 2020-08-12 — End: ?
  Administered 2020-08-12: 20:00:00 40 mg via EPIDURAL

## 2020-08-12 MED ORDER — BUPIVACAINE (PF) 0.5 % (5 MG/ML) IJ SOLN
1 mL | Freq: Once | INTRAMUSCULAR | 0 refills | Status: CP
Start: 2020-08-12 — End: ?
  Administered 2020-08-12: 20:00:00 1 mL via INTRAMUSCULAR

## 2020-08-12 NOTE — Progress Notes
Pain Procedure Plan Of Care    Risk of injury related to procedure  Patient identification, allergies verified, fall precautions implemented    Risk of injury and impaired skin integrity  Positioning devices applied as appropriate for procedure, patient transported with staff assistance    Management of Pain  Pain assessment completed on arrival, PAR scoring following procedure and at discharge, sedation administered as ordered, patient positioned for comfort    Risk of anxiety related to procedure and disease process  Patient education on procedure and expectations, provide coping support to patient, provide relaxation techniques    Outcomes:  The patient is free of injury during and following their procedure.  Skin is intact and free of bruising.  The patients pain is managed during their stay.  Alleviation of patient anxiety exhibited.

## 2020-08-12 NOTE — Progress Notes
SPINE CENTER  INTERVENTIONAL PAIN PROCEDURE HISTORY AND PHYSICAL    Chief Complaint   Patient presents with   ? Lower Back - Pain       HISTORY OF PRESENT ILLNESS:  Ruben Wood is a 80 y.o. year old who presents today with low back pain for scheduled left SIJ injection.  Patient was last seen in clinic on 08/11/20 with Dr. Katrinka Blazing and reports no changes in their symptoms since their last clinic visit.  Patient denies recent fevers, chills, rash, or recent hospitalizations.  Denies any blood thinner use.  No allergies to contrast, latex, seafood, iodine, or shellfish.  Patient requests to proceed with injection.    This patient's clinical history, exam, AND imaging support the diagnosis AND there is a significant impact on quality of life and function AND their pain score has been documented in this note AND the pain has been present for at least 4 weeks AND they have failed to improve with noninvasive conservative care.        Medical History:   Diagnosis Date   ? AAA (abdominal aortic aneurysm) (HCC) 10/15/2012   ? Asthma    ? BPH with obstruction/lower urinary tract symptoms    ? Bronchiectasis (HCC)    ? CAD (coronary artery disease), native coronary artery 08/15/2012    07/11/2002- Cath @ [facility] showed Severe double vessel disease(OMB of circumflex and distal circ)  inferior-basilar dyskinesis.                 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  09/12/12   Cath    ? Cardiomyopathy (HCC) 07/19/2014   ? Cellulitis of left lower extremity 11/12/2014    10/2014: admitted with cellulitis, [place] negative for venous clot, MRI without osteomyelitis.    11/12/2014 In clinic today, still with cellulitis.  Per patient and his wife, the erythema may be extending.  Cultures from drainage in hospital with MSSA, but Blood Cx negative. No e/o osteomyelitis.  My concern is that this antibiotic regimen is not adequately treating his cellulitis.  We will broaden coverage to get MRSA with doxycycline.  Patient and wife given strict call/return criteria.  I will see patient in 3-4 days for re-evaluation.  No fever today.  Plan:  Stop cefpodoxime and start doxycylcine  11/17/2014 Much better, no warmth and erythema minimal.   Plan: continue doxycyline for full 10 day course.      ? Chest wall deformity 07/09/2012    Chest wall reconstruction on 07/09/12    ? CHF (congestive heart failure) (HCC)    ? 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 Chronic cough 08/29/2012   ? 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 COPD (chronic obstructive pulmonary disease) (HCC) 08/13/2012   ? Dyspnea    ? Edema    ? GERD (gastroesophageal reflux disease) 08/13/2012   ? History of CHF (congestive heart failure) 08/13/2012   ? History of MRSA infection 10/14/2012   ? History of repair of aneurysm of abdominal aorta using endovascular stent graft 08/26/2013    10/25/12: Successful repair of an abdominal aortic aneurysm utilizing endovascular technique with a Gore Excluder device with 26 x 14 x  18 cm main endoprosthesis on the right and 13.5 cm contralateral leg device successfully delivered without evidence of any endoleaks and excellent results.    ? Hypertension 08/13/2012   ? 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 Morbid obesity (HCC) 08/13/2012   ? On supplemental oxygen therapy    ? OSA on CPAP 08/13/2012   ? Pneumonia 04/2012    [facility] Pillion- [facility]   ? Pneumonia due to COVID-19 virus 12/05/2018   ? Seasonal allergic reaction        Surgical History:   Procedure Laterality Date   ? LUNG SURGERY  2015    Bio bridge in left lung/rib cage was broken  from coughing   ? BACK SURGERY  Jan 2016   ? Right Heart Catheterization Right 11/06/2014    Performed by Cath, Physician at [facility] CATH LAB   ? CARDIAC DEFIBRILLATOR PLACEMENT  2017   ? Right Heart Catheterization With Insertion Pulmonary Artery Sensor Right 03/30/2015    Performed by Harley Alto, MD at [facility] CATH LAB   ? Insert CRT-D and Leads Right 11/25/2015    Performed by Deniece Ree, MD at [facility] EP LAB   ? Fluoroscopy N/A 11/25/2015    Performed by Deniece Ree, MD at [facility] EP LAB   ? Defibrillation Threshold Testing at ICD Implant N/A 11/25/2015    Performed by Deniece Ree, MD at [facility] EP LAB   ? VARICOSE VEIN SURGERY Left 01/05/2016    left small saphenous endovenous ablation with left leg microphlebectomy-Dr. Junita Push   ? HX MICROPHLEBECTOMY Right 07/06/2016    [place]   ? HX ENDOVENOUS ABLATION OF THE SMALL SAPHENOUS VEIN Right 07/06/2016    [facility]   ? ANGIOGRAPHY CORONARY ARTERY WITH LEFT HEART CATHETERIZATION N/A 04/02/2017    Performed by Nat Math, MD at [facility] CATH LAB   ? PERCUTANEOUS CORONARY STENT PLACEMENT WITH ANGIOPLASTY N/A 04/02/2017    Performed by Nat Math, MD at [facility] CATH LAB   ? PERCUTANEOUS CORONARY STENT PLACEMENT WITH ANGIOPLASTY N/A 06/29/2017    Performed by Nat Math, MD at [facility] CATH LAB   ? LEFT ULNAR NERVE DECOMPRESSION AT ELBOW Left 04/30/2018    Performed by Letitia Neri, MD at [facility] OR   ? BRONCHOSCOPY     ? CARDIAC CATHERIZATION  several years ago    [place]   ? DOPPLER ECHOCARDIOGRAPHY     ? HERNIA REPAIR     ? HX CHOLECYSTECTOMY     ? HX HEART CATHETERIZATION     ? HX LUMBAR LAMINECTOMY     ? HX PACEMAKER PLACEMENT         family history includes Cancer in his father and mother; Coronary Artery Disease in his father; None Reported in his sister; Stroke in his sister.    Social History     Socioeconomic History   ? Marital status: Married     Spouse name: [name]   ? Number of children: 0   Occupational History   ? Occupation: former Therapist, music: RETIRED     Comment: lives in a 81 year old house with wife   Tobacco Use   ? Smoking status: Former Smoker     Packs/day: 2.00     Years: 40.00     Pack years: 80.00     Types: Cigarettes     Quit date: 07/09/1998  Years since quitting: 22.1   ? Smokeless tobacco: Never Used   Vaping Use   ? Vaping Use: Never used   Substance and Sexual Activity   ? Alcohol use: Not Currently   ? Drug use: Never   ? Sexual activity: Not Currently       Allergies   Allergen Reactions   ? Chlorhexidine BLISTERS and EDEMA       Vitals:    08/12/20 1336   BP: (!) 146/68   BP Source: Arm, Right Upper   Pulse: 94   Temp: 36.4 ?C (97.6 ?F)   Resp: 18   SpO2: 96%  Comment: oxygen set at 2.   TempSrc: Oral   PainSc: Zero   Weight: 135.6 kg (299 lb)   Height: 180.3 cm (5' 11)       REVIEW OF SYSTEMS: 10 point ROS obtained and negative except low back pain as stated above    PHYSICAL EXAM:  General: 80 y.o. year old who appears stated age, in no acute distress  HEENT: Normocephalic, atraumatic  Neck: No thryoidmegaly  Cardiovascular: Well perfused  Pulmonary: Unlabored respirations  Extremities: No cyanosis, clubbing, or edema  Skin: Warm and dry  Psychiatric:  Appropriate mood and affect  Musculoskeletal: no tenderness to palpation.  Full ROM BLE.     Neurologic: Lower extremity myotomes are all 5/5.  Lower extremity dermatomes are all intact to light touch.  Deep tendon reflexes are symmetric at patella and achilles.  Negative slump test bilaterally. Downward Babinski.  No ankle clonus.        IMPRESSION:    1. Sacroiliitis (HCC)         PLAN: Other left sacroiliac joint injection    Patient will plan to follow up in clinic with Dr. Iona Beard following injection as needed.  Encouraged to call the clinic with any additional concerns, questions or change in symptoms.

## 2020-08-12 NOTE — Patient Instructions
Procedure Completed Today: Joint Injection  pelvis    Important information following your procedure today: You may drive today    1. Pain relief may not be immediate. It is possible you may even experience an increase in pain during the first 24-48 hours followed by a gradual decrease of your pain.  2. Though the procedure is generally safe and complications are rare, we do ask that you be aware of any of the following:   ? Any swelling, persistent redness, new bleeding, or drainage from the site of the injection.  ? You should not experience a severe headache.  ? You should not run a fever over 101? F.  ? New onset of sharp, severe back & or neck pain.  ? New onset of upper or lower extremity numbness or weakness.  ? New difficulty controlling bowel or bladder function after the injection.  ? New shortness of breath.    If any of these occur, please call to report this occurrence to a nurse at [phone]. If you are calling after 4:00 p.m., on weekends or holidays please call [phone] and ask to have the resident physician on call for the physician paged or go to your local emergency room.  3. You may experience soreness at the injection site. Ice can be applied at 20 minute intervals. Avoid application of direct heat, hot showers or hot tubs today.  4. Avoid strenuous activity today. You may resume your regular activities and exercise tomorrow.  5. Patients with diabetes may see an elevation in blood sugars for 7-10 days after the injection. It is important to pay close attention to your diet, check your blood sugars daily and report extreme elevations to the physician that treats your diabetes.  6. Patients taking a daily blood thinner can resume their regular dose this evening.  7. It is important that you take all medications ordered by your pain physician. Taking medication as ordered is an important part of your pain care plan. If you cannot continue the medication plan, please notify the physician. Possible side effects to steroids that may occur:  ? Flushing or redness of the face  ? Irritability  ? Fluid retention  ? Change in women?s menses    The following medications were used: Lidocaine , Triamcinolone   and Contrast Dye

## 2020-08-12 NOTE — Procedures
Attending Surgeon: Don Perking, MD    Anesthesia: Local    Pre-Procedure Diagnosis:   1. Sacroiliitis (HCC)        Post-Procedure Diagnosis:   1. Sacroiliitis (HCC)        Pain Score: Zero    [place] [facility] SPINE SI JOINT INJECT ANESTH/STEROID PROC  Location: sacroiliac joint L sacroiliac joint6/23/2022 2:45 PM    Consent:   Consent obtained: verbal and written  Consent given by: patient  Risks discussed: itching, nerve damage, pain, infection, damage to surrounding structures, soft tissue reaction and bleeding  Alternatives discussed: alternative treatment, delayed treatment, no treatment and referral  Discussed with patient the purpose of the treatment/procedure, other ways of treating my condition, including no treatment/ procedure and the risks and benefits of the alternatives. Patient has decided to proceed with treatment/procedure.        Universal Protocol:  Relevant documents: relevant documents present and verified  Test results: test results available and properly labeled  Imaging studies: imaging studies available  Required items: required blood products, implants, devices, and special equipment available  Site marked: the operative site was marked  Patient identity confirmed: Patient identify confirmed verbally with patient.        Time out: Immediately prior to procedure a time out was called to verify the correct patient, procedure, equipment, support staff and site/side marked as required      Procedures Details:   Indications: pain   Prep: 2% chlorhexidine  Guidance: fluoroscopy  Needle size: 25 G  Patient tolerance: Patient tolerated the procedure well with no immediate complications. Pressure was applied, and hemostasis was accomplished.  Comments: DESCRIPTION OF PROCEDURE:  The procedure risks and benefits were explained and informed consent was obtained from the patient.  The patient was placed in the prone position on the fluoroscopy table.  A blood pressure cuff and oxygen saturation monitors were attached and the patient was monitored throughout the procedure.  The left SI joint was identified with the use of fluoroscopy in the AP view.  The skin was prepped with Chlorhexadine and draped in aseptic fashion.  The C-arm was rotated obliquely towards the right side to line up the anterior and posterior margins of the SI joint.  The skin and subcutaneous tissue were anesthetized using 3 mL of 1 percent lidocaine with a 25-gauge needle.  A 3.5 inch 22-gauge spinal needle was advanced parallel to the x-ray beam towards the lower third of the joint line.  The needle was advanced slowly until the tip of the needle made contact with the bone.  The needle was walked off from the bone into the joint space.  We injected 0.4 mL of Isovue contrast dye and an arthrogram was seen.  After negative aspiration, a solution containing 1 mL of 0.5 percent bupivacaine and 40 mg of triamcinolone was injected.  The stylet was reinserted and the needle was then removed.     The patient tolerated the procedure well was brought to the recovery room for observation in stable condition and discharged with written discharge instructions.  There were no complications    PLAN OF CARE:  The patient is to follow up in the interventional spine clinic in 4-6 weeks.     The patient was advised to contact the interventional spine center for any of the following:    Fever, chills, or night sweats.  New onset severe sharp pain.  Any new upper or lower extremity weakness or numbness.  Any questions regarding the  procedure.     If unable to contact the interventional spine center, the patient was instructed to go to the local emergency room.           Estimated blood loss: none or minimal  Specimens: none  Patient tolerated the procedure well with no immediate complications. Pressure was applied, and hemostasis was accomplished.

## 2020-08-13 ENCOUNTER — Ambulatory Visit: Admit: 2020-08-13 | Discharge: 2020-08-14 | Payer: MEDICARE

## 2020-08-13 ENCOUNTER — Encounter: Admit: 2020-08-13 | Discharge: 2020-08-13 | Payer: MEDICARE

## 2020-08-13 DIAGNOSIS — J9611 Chronic respiratory failure with hypoxia: Secondary | ICD-10-CM

## 2020-08-13 DIAGNOSIS — J3489 Other specified disorders of nose and nasal sinuses: Secondary | ICD-10-CM

## 2020-08-13 DIAGNOSIS — J45901 Unspecified asthma with (acute) exacerbation: Secondary | ICD-10-CM

## 2020-08-13 NOTE — Progress Notes
History of Present Illness  Ruben Wood. is a 80 y.o. male     Obtained patient's verbal consent to treat them and their agreement to [facility] financial policy and NPP via this telehealth visit during the [facility]    Ed is being seen today as an urgent add-on appointment.  He had a spinal injection done yesterday and when he walked out of the building almost instantaneously started develop significant rhinorrhea.  He continued to have severe rhinorrhea throughout the night.  He woke up this morning and is still having a lot, but it has improved slightly.  He has severe asthma with frequent exacerbations.  He has had some increased cough and chest congestion and wheezing today, but his breathing is still nonlabored and his oxygen levels have been stable.  He consistently wears oxygen at baseline he has not had to change his oxygen at all.  His oxygen saturation during this call was 94%.  He has been using Flonase as well as ipratropium.  He just finished a prednisone taper for recent asthma exacerbation.  He does not have any fevers or chills.  He denies any chest pain.     Review of Systems  Per HPI    Objective:         ? acetaminophen (TYLENOL) 325 mg tablet Take two tablets by mouth every 6 hours as needed for Pain.   ? albuterol 0.083% (PROVENTIL) 2.5 mg /3 mL (0.083 %) nebulizer solution Inhale 3 mL solution by nebulizer as directed every 6 hours as needed for Wheezing or Shortness of Breath. Indications: asthma   ? albuterol sulfate (PROAIR HFA) 90 mcg/actuation HFA aerosol inhaler Inhale two puffs by mouth into the lungs every 4 hours as needed for Wheezing or Shortness of Breath.   ? allopurinoL (ZYLOPRIM) 300 mg tablet Take one tablet by mouth daily. Take with food.   ? AMITR/GABAPEN/EMU OIL 05/24/08% CREAM (COMPOUND) Apply topically to affected area three times daily as needed.   ? arformoteroL (BROVANA) 15 mcg/2 mL nebulizer solution Inhale 2 mL solution by nebulizer as directed twice daily. Dx: J44.9   ? ASCORBIC ACID-ASCORBATE SODIUM PO Take 500 mg by mouth every morning.   ? atorvastatin (LIPITOR) 40 mg tablet Take one tablet by mouth daily.   ? benzonatate (TESSALON PERLES) 100 mg capsule Take one capsule by mouth every 8 hours.   ? budesonide (PULMICORT) 0.25 mg/2 mL nebulizer solution Inhale 2 mL solution by nebulizer as directed twice daily. Dx: J44.9   ? bumetanide (BUMEX) 1 mg tablet Take three tablets by mouth twice daily.   ? cholecalciferol (VITAMIN D-3) 1,000 units tablet Take 1,000 Units by mouth daily.   ? diclofenac sodium (VOLTAREN) 1 % topical gel Apply four g topically to affected area four times daily.   ? duloxetine DR (CYMBALTA) 60 mg capsule Take one capsule by mouth daily.   ? empagliflozin (JARDIANCE) 25 mg tablet Take one tablet by mouth daily.   ? finasteride (PROSCAR) 5 mg tablet Take one tablet by mouth daily.   ? fish oil- omega 3-DHA/EPA 300/1,000 mg capsule Take 1 capsule by mouth daily.   ? flash glucose scanning reader (FREESTYLE LIBRE) reader Use as directed.   ? flash glucose sensor (FREESTYLE LIBRE 14 DAY SENSOR) sensor Type 2 diabetes  Indications: type 2 diabetes mellitus   ? gabapentin (NEURONTIN) 100 mg capsule Take four capsules by mouth twice daily.   ? insulin pen needles (disposable) (BD ULTRA-FINE MINI  PEN NEEDLE) 31 gauge x 3/16 pen needle Use with insulin pens   ? ipratropium bromide (ATROVENT) 21 mcg (0.03 %) nasal spray Apply two sprays to each nostril as directed every 12 hours.   ? metOLazone (ZAROXOLYN) 2.5 mg tablet Take one tablet by mouth daily as needed. PER OUTPATIENT CARDIOLOGY PROVIDER AS NEEDED FOR FLUID RETENTION   ? Miscellaneous Medical Supply misc Vancomycin 1,500 mg IV every 24hours starting 06/25/20 x 4 days (14 total antibiotic days), please draw vancomycin level per your protocol at [facility].   ? Miscellaneous Medical Supply misc Rx: Please wrap legs with ACE bandage from toes as high up to the leg as possible bilaterally  Dx: Lymphedema   ? montelukast (SINGULAIR) 10 mg tablet Take one tablet by mouth at bedtime daily.   ? nitroglycerin (NITROSTAT) 0.4 mg tablet Place 0.4 mg under tongue every 5 minutes as needed for Chest Pain. Max of 3 tablets, call 911.   ? nystatin (MYCOSTATIN) 100,000 unit/g topical cream Apply  topically to affected area twice daily.   ? oxyCODONE (ROXICODONE) 5 mg tablet Take one tablet to two tablets by mouth every 8 hours as needed for Pain   ? pantoprazole DR (PROTONIX) 40 mg tablet Take one tablet by mouth twice daily.   ? polyethylene glycol 3350 (MIRALAX) 17 g packet Take two packets by mouth twice daily.   ? predniSONE (DELTASONE) 10 mg tablet Take four tablets by mouth daily for 5 days, THEN two tablets daily for 5 days, THEN one tablet daily for 5 days, THEN one-half tablet daily for 5 days.   ? rivaroxaban (XARELTO) 20 mg tablet Take one tablet by mouth daily with dinner. Take with food.   ? semaglutide (OZEMPIC) 0.25 mg or 0.5 mg(2 mg/1.5 mL) injection PEN Inject one-quarter mg under the skin every 7 days.   ? senna/docusate (SENOKOT-S) 8.6/50 mg tablet Take one tablet by mouth twice daily.   ? sodium chloride (HYPER-SAL) 3.5 % inhalation solution Inhale 4 mL by mouth into the lungs twice daily. 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 spironolactone (ALDACTONE) 25 mg tablet Take one tablet by mouth twice daily. Take with food.   ? tamsulosin (FLOMAX) 0.4 mg capsule TAKE (1) CAPSULE BY MOUTH ONCE DAILY.   ? traMADoL (ULTRAM) 50 mg tablet Take one tablet by mouth every 8 hours as needed for Pain.   ? zinc sulfate 220 mg (50 mg elemental zinc) capsule Take 220 mg by mouth daily.     Vitals:    08/13/20 1018   PainSc: Zero     Vitals:    08/13/20 1018   PainSc: Zero       There is no height or weight on file to calculate BMI.     Physical Exam  Constitutional:       Appearance: Normal appearance.   HENT:      Head: Normocephalic and atraumatic.      Nose: Rhinorrhea present.   Pulmonary: Effort: Pulmonary effort is normal.   Neurological:      General: No focal deficit present.      Mental Status: He is alert.   Psychiatric:         Mood and Affect: Mood normal.         Labwork reviewed:  Lab Results   Component Value Date/Time    HGBA1C 6.7 (H) 04/04/2020 05:40 AM    HGBA1C 5.9 11/25/2019 02:45 PM    HGBA1C 5.9 09/02/2019 09:50 AM  HGBPOC 14.3 04/08/2020 06:58 AM    HGBPOC 11.2 (L) 10/25/2012 10:58 AM    HGBPOC 12.9 (L) 10/25/2012 08:55 AM    MCALBR 7.2 05/18/2020 01:45 PM    TSH 2.49 03/05/2020 08:40 PM    FREET4R 0.99 12/15/2014 09:12 AM    CHOL 161 04/04/2020 05:40 AM    TRIG 152 (H) 04/04/2020 05:40 AM    HDL 53 04/04/2020 05:40 AM    LDL 87 04/04/2020 05:40 AM    NA 142 08/05/2020 02:09 PM    K 4.2 08/05/2020 02:09 PM    CL 100 08/05/2020 02:09 PM    CO2 31 (H) 08/05/2020 02:09 PM    GAP 11 08/05/2020 02:09 PM    BUN 37 (H) 08/05/2020 02:09 PM    CR 1.35 (H) 08/05/2020 02:09 PM    GLU 84 08/05/2020 02:09 PM    CA 8.5 08/05/2020 02:09 PM    PO4 4.1 03/08/2020 03:51 AM    ALBUMIN 3.8 06/15/2020 02:50 PM    TOTPROT 6.0 06/15/2020 02:50 PM    ALKPHOS 67 06/15/2020 02:50 PM    AST 13 06/15/2020 02:50 PM    ALT 9 06/15/2020 02:50 PM    TOTBILI 0.8 06/15/2020 02:50 PM    GFR 44 01/30/2020 12:00 AM    GFRAA >60 12/03/2019 04:51 AM    PSA 2.96 11/25/2018 12:00 AM              Assessment and Plan:    1. Rhinorrhea    2. Moderate asthma with acute exacerbation, unspecified whether persistent    3. Chronic respiratory failure with hypoxia (HCC)      He is here today with significant rhinorrhea which I think is most likely allergic rhinitis.  He does not have any obvious infectious symptoms or exposures.  He does have severe asthma and might be having a mild exacerbation today.  I want to hold on giving prednisone given that we have had to give him multiple rounds of prednisone recently for severe asthma exacerbations.  His oxygen levels are stable.  I advised that he do sinus rinses.  He can use Flonase and ipratropium nasal spray.  I am okay with him using Afrin spray x2, but I do want him to use it beyond that.  If he starts to develop worsening respiratory status over the weekend, I would like him to present to the local emergency department.    There are no Patient Instructions on file for this visit.    No follow-ups on file.

## 2020-08-13 NOTE — Telephone Encounter
Agreed to telehealth visit today in 10 minutes with Dr. Crecencio Mc.     Went to get back injections and patient kept sneezing entire trip.    Etta Grandchild, RN

## 2020-08-13 NOTE — Telephone Encounter
Attempted to call patient to schedule iron infusions.  No answer, LVMOM requesting call back.  HF infusion clinic phone number provided.

## 2020-08-13 NOTE — Progress Notes
Did patient read financial policy, consent to treat, and notice of privacy practices? Yes    Does the patient give verbal consent to each policy? Yes    Does the patient have any vitals to report? No    N/A Vitals charted in O2? N/A    Is the patient in pain? 0 = No pain    Screening questions completed? Yes    Is the patient able to acces the Mychart message with the start visit link? Yes    Is patient in "virtual waiting room" Yes    Malyssa Maris, RN

## 2020-08-13 NOTE — Telephone Encounter
Patient's wife called stating that yesterday patient developed sneezing and congestion.  Symptoms worsened today with chest congestion and constant drainage noting symptoms flared.  Stating patient is not having an easy time.      No current openings    Routing to Dr. Crecencio Mc to advise  Etta Grandchild, RN

## 2020-08-16 ENCOUNTER — Encounter: Admit: 2020-08-16 | Discharge: 2020-08-16 | Payer: MEDICARE

## 2020-08-16 ENCOUNTER — Ambulatory Visit: Admit: 2020-08-16 | Discharge: 2020-08-16 | Payer: MEDICARE

## 2020-08-16 DIAGNOSIS — I5043 Acute on chronic combined systolic (congestive) and diastolic (congestive) heart failure: Secondary | ICD-10-CM

## 2020-08-16 NOTE — Telephone Encounter
Called patient to schedule Feraheme as ordered by Ruben Philips, APRN.     Patient denies having an active infection at this time, however states he takes a daily antibiotic prophylactically.    Educated patient on check-in and infusion process.     First dose scheduled for this Friday 7/1 at 1200.

## 2020-08-20 ENCOUNTER — Encounter: Admit: 2020-08-20 | Discharge: 2020-08-20 | Payer: MEDICARE

## 2020-08-20 ENCOUNTER — Ambulatory Visit: Admit: 2020-08-20 | Discharge: 2020-08-21 | Payer: MEDICARE

## 2020-08-20 DIAGNOSIS — I255 Ischemic cardiomyopathy: Secondary | ICD-10-CM

## 2020-08-20 MED ORDER — FERUMOXYTOL 510 MG IN 50 ML IVPB
510 mg | Freq: Once | INTRAVENOUS | 0 refills | Status: CP
Start: 2020-08-20 — End: ?
  Administered 2020-08-20 (×2): 510 mg via INTRAVENOUS

## 2020-08-20 MED ORDER — FERUMOXYTOL 510 MG IN 50 ML IVPB
510 mg | Freq: Once | INTRAVENOUS | 0 refills | Status: CN
Start: 2020-08-20 — End: ?

## 2020-08-20 MED ORDER — FERUMOXYTOL 510 MG IN 50 ML IVPB
510 mg | Freq: Once | INTRAVENOUS | 0 refills
Start: 2020-08-20 — End: ?

## 2020-08-20 NOTE — Progress Notes
Patient received Feraheme: Dose 1/2 per eMAR.     Patient tolerated infusion without complications, no concerns noted at this time.     Patient monitored for 30 minutes post iron infusion. VSS, see below:       08/20/20 1310   Vitals   Temp 36.8 C (98.3 F)   Temperature Source Oral   Pulse 91   Respirations 20 PER MINUTE   SpO2 94 %   BP 115/53   BP Source Arm, Left Upper   BP Patient Position Chair       No phlebitis or infiltration noted around IV site at this time.

## 2020-08-21 DIAGNOSIS — E1165 Type 2 diabetes mellitus with hyperglycemia: Secondary | ICD-10-CM

## 2020-08-21 DIAGNOSIS — I5042 Chronic combined systolic (congestive) and diastolic (congestive) heart failure: Secondary | ICD-10-CM

## 2020-08-25 ENCOUNTER — Encounter: Admit: 2020-08-25 | Discharge: 2020-08-25 | Payer: MEDICARE

## 2020-08-25 DIAGNOSIS — R399 Unspecified symptoms and signs involving the genitourinary system: Secondary | ICD-10-CM

## 2020-08-25 MED ORDER — TAMSULOSIN 0.4 MG PO CAP
.4 mg | ORAL_CAPSULE | Freq: Every day | ORAL | 1 refills | 90.00000 days | Status: AC
Start: 2020-08-25 — End: ?

## 2020-08-26 NOTE — Patient Instructions
Clinic Visit Summary:   Please contact Pulmonary Nurse Coordinator with signs and symptoms of worsening productive cough with thick secretions, blood in sputum, chest tightness/pain, shortness of breath, fever, chills, night sweats, or any questions or concerns.     Desire Evans Poppa, BSN,RN - Pulmonary RN Coordinator    Phone [phone]        Fax [phone]  For refills on medications, please have your pharmacy fax a refill authorization request form to our office at Fax) [phone]. Please allow at least 3 business days for refill requests.   For urgent issues after business hours/weekends/holidays call [phone] and request for the pulmonary fellow to be paged

## 2020-08-27 ENCOUNTER — Ambulatory Visit: Admit: 2020-08-27 | Discharge: 2020-08-28 | Payer: MEDICARE

## 2020-08-27 ENCOUNTER — Encounter: Admit: 2020-08-27 | Discharge: 2020-08-27 | Payer: MEDICARE

## 2020-08-27 DIAGNOSIS — J438 Other emphysema: Secondary | ICD-10-CM

## 2020-08-27 DIAGNOSIS — J309 Allergic rhinitis, unspecified: Secondary | ICD-10-CM

## 2020-08-27 MED ORDER — AZELASTINE 137 MCG (0.1 %) NA SPRA
2 | Freq: Two times a day (BID) | NASAL | 5 refills | 50.00000 days | Status: AC
Start: 2020-08-27 — End: ?

## 2020-08-27 MED ORDER — PREDNISONE 20 MG PO TAB
40 mg | ORAL_TABLET | Freq: Every day | ORAL | 0 refills | Status: AC
Start: 2020-08-27 — End: ?

## 2020-08-28 ENCOUNTER — Encounter: Admit: 2020-08-28 | Discharge: 2020-08-28 | Payer: MEDICARE

## 2020-08-28 DIAGNOSIS — J189 Pneumonia, unspecified organism: Secondary | ICD-10-CM

## 2020-08-28 DIAGNOSIS — E782 Mixed hyperlipidemia: Secondary | ICD-10-CM

## 2020-08-28 DIAGNOSIS — G4733 Obstructive sleep apnea (adult) (pediatric): Secondary | ICD-10-CM

## 2020-08-28 DIAGNOSIS — I5042 Chronic combined systolic (congestive) and diastolic (congestive) heart failure: Secondary | ICD-10-CM

## 2020-08-28 DIAGNOSIS — N401 Enlarged prostate with lower urinary tract symptoms: Secondary | ICD-10-CM

## 2020-08-28 DIAGNOSIS — J449 Chronic obstructive pulmonary disease, unspecified: Secondary | ICD-10-CM

## 2020-08-28 DIAGNOSIS — K219 Gastro-esophageal reflux disease without esophagitis: Secondary | ICD-10-CM

## 2020-08-28 DIAGNOSIS — I10 Essential (primary) hypertension: Secondary | ICD-10-CM

## 2020-08-28 DIAGNOSIS — Z95828 Presence of other vascular implants and grafts: Secondary | ICD-10-CM

## 2020-08-28 DIAGNOSIS — R053 Chronic cough: Secondary | ICD-10-CM

## 2020-08-28 DIAGNOSIS — Z8679 Personal history of other diseases of the circulatory system: Secondary | ICD-10-CM

## 2020-08-28 DIAGNOSIS — J45909 Unspecified asthma, uncomplicated: Secondary | ICD-10-CM

## 2020-08-28 DIAGNOSIS — R0600 Dyspnea, unspecified: Secondary | ICD-10-CM

## 2020-08-28 DIAGNOSIS — N183 CKD (chronic kidney disease) stage 3, GFR 30-59 ml/min (HCC): Secondary | ICD-10-CM

## 2020-08-28 DIAGNOSIS — J479 Bronchiectasis, uncomplicated: Secondary | ICD-10-CM

## 2020-08-28 DIAGNOSIS — I429 Cardiomyopathy, unspecified: Secondary | ICD-10-CM

## 2020-08-28 DIAGNOSIS — M954 Acquired deformity of chest and rib: Secondary | ICD-10-CM

## 2020-08-28 DIAGNOSIS — Z9981 Dependence on supplemental oxygen: Secondary | ICD-10-CM

## 2020-08-28 DIAGNOSIS — I2510 Atherosclerotic heart disease of native coronary artery without angina pectoris: Secondary | ICD-10-CM

## 2020-08-28 DIAGNOSIS — R609 Edema, unspecified: Secondary | ICD-10-CM

## 2020-08-28 DIAGNOSIS — I714 Abdominal aortic aneurysm, without rupture: Secondary | ICD-10-CM

## 2020-08-28 DIAGNOSIS — Z8614 Personal history of Methicillin resistant Staphylococcus aureus infection: Secondary | ICD-10-CM

## 2020-08-28 DIAGNOSIS — J302 Other seasonal allergic rhinitis: Secondary | ICD-10-CM

## 2020-08-28 DIAGNOSIS — I509 Heart failure, unspecified: Secondary | ICD-10-CM

## 2020-08-28 DIAGNOSIS — U071 Pneumonia due to COVID-19 virus: Secondary | ICD-10-CM

## 2020-08-28 DIAGNOSIS — M961 Postlaminectomy syndrome, not elsewhere classified: Secondary | ICD-10-CM

## 2020-08-28 DIAGNOSIS — L03116 Cellulitis of left lower limb: Secondary | ICD-10-CM

## 2020-08-30 ENCOUNTER — Encounter: Admit: 2020-08-30 | Discharge: 2020-08-30 | Payer: MEDICARE

## 2020-08-30 MED ORDER — PREDNISONE 10 MG PO TAB
ORAL_TABLET | Freq: Every day | ORAL | 0 refills | Status: AC
Start: 2020-08-30 — End: ?

## 2020-08-30 NOTE — Telephone Encounter
Per Dr.Sharpe, sent via [employer] (spouse)    Ruben Furlong, MD  [name], [name], RN  He can add mucinex for chest congestion. Continue steroids and all breathing treatments. Ipratropium nasal spray for drying up secretions. Afrin for sinus congestion.   Matt

## 2020-08-30 NOTE — Telephone Encounter
MyChart message from [place] (spouse): Ed started the prednisone late Friday night . He is not seeing really any improvement.  He thinks he does and then it flares up.

## 2020-08-30 NOTE — Telephone Encounter
Deveron Furlong, MD  [name], Fairy Ashlock, RN  Caller: Unspecified (Today, 10:11 AM)  When he completes 40 prednisone after 5 days can drop to 20 mg for 3 days then 10 for 3 days before coming off. Make certain he is monitoring for weight gain and edema that could be heart failure related.            Message sent to wife's mychart.

## 2020-08-31 ENCOUNTER — Encounter: Admit: 2020-08-31 | Discharge: 2020-08-31 | Payer: MEDICARE

## 2020-08-31 MED FILL — SODIUM CHLORIDE 3.5 % IN NEBU: 3.5 % | RESPIRATORY_TRACT | 30 days supply | Qty: 240 | Fill #2 | Status: AC

## 2020-08-31 MED FILL — BUDESONIDE 0.25 MG/2 ML IN NBSP: 0.25 mg/2 mL | RESPIRATORY_TRACT | 30 days supply | Qty: 120 | Fill #4 | Status: AC

## 2020-08-31 MED FILL — ALBUTEROL SULFATE 2.5 MG /3 ML (0.083 %) IN NEBU: 2.5 mg /3 mL (0.083 %) | RESPIRATORY_TRACT | 19 days supply | Qty: 225 | Fill #3 | Status: AC

## 2020-09-01 ENCOUNTER — Encounter: Admit: 2020-09-01 | Discharge: 2020-09-01 | Payer: MEDICARE

## 2020-09-01 NOTE — Telephone Encounter
Patient's wife called stating that patient has been working with Dr. [name] and will start prednisone taper.    Concerned that he may need an antibiotic.    Scheduled telehealth visit with Janit Bern tomorrow.  Patient's wife will cancel if symptoms improve.    Etta Grandchild, RN

## 2020-09-02 ENCOUNTER — Encounter: Admit: 2020-09-02 | Discharge: 2020-09-02 | Payer: MEDICARE

## 2020-09-02 MED FILL — ARFORMOTEROL 15 MCG/2 ML IN NEBU: 15 mcg/2 mL | RESPIRATORY_TRACT | 30 days supply | Qty: 120 | Fill #4 | Status: AC

## 2020-09-02 NOTE — Telephone Encounter
Received call from patients wife, [name],stating patient is starting antibiotics today for ongoing cough and respiratory symptoms.     Iron infusion previously scheduled for tomorrow 7/15 will be rescheduled to 7/20 at 1300 after patient completes his antibiotics.

## 2020-09-04 ENCOUNTER — Encounter: Admit: 2020-09-04 | Discharge: 2020-09-04 | Payer: MEDICARE

## 2020-09-08 ENCOUNTER — Encounter: Admit: 2020-09-08 | Discharge: 2020-09-08 | Payer: MEDICARE

## 2020-09-08 DIAGNOSIS — E1165 Type 2 diabetes mellitus with hyperglycemia: Secondary | ICD-10-CM

## 2020-09-08 MED ORDER — RIVAROXABAN 20 MG PO TAB
20 mg | ORAL_TABLET | Freq: Every day | ORAL | 1 refills
Start: 2020-09-08 — End: ?

## 2020-09-08 MED ORDER — OZEMPIC 0.25 MG OR 0.5 MG(2 MG/1.5 ML) SC PNIJ
.5 mg | SUBCUTANEOUS | 1 refills | Status: AC
Start: 2020-09-08 — End: ?

## 2020-09-08 NOTE — Telephone Encounter
I called to advise of dosage change.  Verbalizes understanding.    Etta Grandchild, RN

## 2020-09-08 NOTE — Telephone Encounter
Yes, let's increase to 0.5mg  qweekly. Rx updated.

## 2020-09-08 NOTE — Telephone Encounter
Fax received from [facility] requesting clarification on Xarelto. Returned call to pharmacy and confirmed they did not receive refill on 04/26/20 for # [phone].

## 2020-09-08 NOTE — Telephone Encounter
Patient's wife called stating patient is almost out of ozempic.  States this isn't touching it and wondering if dosage could be increased.    Routing to Dr. Crecencio Mc to advise and for orders    Etta Grandchild, RN

## 2020-09-09 ENCOUNTER — Encounter: Admit: 2020-09-09 | Discharge: 2020-09-09 | Payer: MEDICARE

## 2020-09-09 NOTE — Telephone Encounter
-----   Message from Anner Crete, RN sent at 09/09/2020  9:54 AM CDT -----  Regarding: Poss fluid on remote x 8 days. NSVT up to 8 sec.  See remote.  Thanks.

## 2020-09-09 NOTE — Telephone Encounter
Spoke to patient regarding remote. He does not recall any symptoms.   RN informed patient that his remote also showed that he was having possible fluid retention and that a message was sent to Dr. Margaretmary Eddy team to f/u with him. Reviewed plan with the patient. Patient verbalized understanding and does not have any further questions or concerns. No further education requested from patient. Patient has our contact information for future needs.

## 2020-09-10 ENCOUNTER — Encounter: Admit: 2020-09-10 | Discharge: 2020-09-10 | Payer: MEDICARE

## 2020-09-10 DIAGNOSIS — I255 Ischemic cardiomyopathy: Secondary | ICD-10-CM

## 2020-09-10 NOTE — Telephone Encounter
FW: Poss fluid on remote x 8 days. NSVT up to 8 sec.  Received: Ennis Forts, RN  P Cvm Nurse Hf Team [place]  Plz f/u on poss fluid on remote. Thanks!            Previous Messages      ----- Message -----   From: Anner Crete, RN   Sent: 09/09/2020  9:55 AM CDT   To: Cvm Nurse Ep Team A   Subject: Poss fluid on remote x 8 days. NSVT up to 8 *     See remote.   Thanks.           Device alert received to coral pool indicating fluid retention. Called patient and was able to speak to both spouse, [name] and patient. Spouse reports worsened swelling to bilateral lower extremities with some +1 edema that she started noticing about 2 days ago. Some swelling to abdomen and some worsened SOB with exertion. No changes to diet or increase in salt intake. Spouse does report he drinks more than 64 oz on some days of the week.    He has taken a PRN Metolazone 2.5 mg yesterday 07/21 with good urinary output, about 4-5 urinals yesterday, though swelling continues. Also takes Bumex 3 mg BID and Spiro 25 mg BID. Keeps legs elevated on his recliner with no relief.    Patient unfortunately does not check daily weights. BP during call 117/63 without lightheadedness.    Spouse, [name] reports patient sent in a Cardiomems reading this morning 07/22. Will inform Cardiomems team.    ZUS and Lorne Skeens are both OOO today. Will ask Dr. Chales Abrahams to review patient symptom call and review recent device alert indicating fluid retention.    Spouse would like call back to discuss recommendations at cell # [phone]

## 2020-09-10 NOTE — Telephone Encounter
Pt's CardioMEMS Readings are BELOW his PAD Thresholds/Goal since 09/06/20. The PAD down today is probably due to taking the extra Metolazone yesterday.       Goal PA Diastolic: 14-15 mmHg   PA Diastolic Thresholds: 12-17 mmHg

## 2020-09-10 NOTE — Telephone Encounter
09/09/20 -  DEVICE EVALUATION - REMOTE ICD     CVM REMOTE HRM  Status: Final result           Interpretation Summary    Title: Remote Check    * Device Interrogation Reviewed by technical staff  * Battery: Battery is at 6%, 1 mos   * Sensing, impedance and thresholds reviewed  * Programmed parameters reviewed, LV outputs 5V/48ms, stable  * Presenting rhythm reviewed, AP BiVP with freq PVC noted  * Heart Rate Histograms reviewed, AP 9%, BiVP 93%  * PMT noted       Title: Non-sustained Ventricular Tachycardia    * Stored EGMs are consistent with or suggestive of Non-sustained VT/VF  * Total episodes: 5  * Duration 4-8 sec, self terminated  * V rates 176-223 bpm      Title: Tachycardia: AT    * Stored EGMs are consistent with or suggestive of Atrial Tachycardia  * AT Burden: 1%  * Total number of episodes: 12  * < 12 sec duration      Title: Pacemaker Mediated Tachycardia (PMT)      * Stored EGMs are consistent with or suggestive of Pacemaker Mediated Tachycardia  * Total episodes: 3  * Stored EGMs reviewed, PVC starts. Algorithm terminates.      Additional Notes: *PVC 3.9%. VS 3%    Title: Thoracic Impedance Out of Range    * Heart failure diagnostics assessed through the device  * Thoracic impedance graph indicates a decreasing impedance trend, possible fouid idicated.   * Thoracic impedance out of range _8 days      Additional Notes: * EP nurses informed

## 2020-09-10 NOTE — Telephone Encounter
09/09/20 -  DEVICE EVALUATION - REMOTE ICD     CVM REMOTE HRM  Status: Final result           Interpretation Summary    Title: Remote Check    * Device Interrogation Reviewed by technical staff  * Battery: Battery is at 6%, 1 mos   * Sensing, impedance and thresholds reviewed  * Programmed parameters reviewed, LV outputs 5V/1ms, stable  * Presenting rhythm reviewed, AP BiVP with freq PVC noted  * Heart Rate Histograms reviewed, AP 9%, BiVP 93%  * PMT noted       Title: Non-sustained Ventricular Tachycardia    * Stored EGMs are consistent with or suggestive of Non-sustained VT/VF  * Total episodes: 5  * Duration 4-8 sec, self terminated  * V rates 176-223 bpm      Title: Tachycardia: AT    * Stored EGMs are consistent with or suggestive of Atrial Tachycardia  * AT Burden: 1%  * Total number of episodes: 12  * < 12 sec duration      Title: Pacemaker Mediated Tachycardia (PMT)      * Stored EGMs are consistent with or suggestive of Pacemaker Mediated Tachycardia  * Total episodes: 3  * Stored EGMs reviewed, PVC starts. Algorithm terminates.      Additional Notes: *PVC 3.9%. VS 3%    Title: Thoracic Impedance Out of Range    * Heart failure diagnostics assessed through the device  * Thoracic impedance graph indicates a decreasing impedance trend, possible fouid idicated.   * Thoracic impedance out of range _8 days      Additional Notes: * EP nurses informed

## 2020-09-13 ENCOUNTER — Ambulatory Visit: Admit: 2020-09-13 | Discharge: 2020-09-14 | Payer: MEDICARE

## 2020-09-13 ENCOUNTER — Encounter: Admit: 2020-09-13 | Discharge: 2020-09-13 | Payer: MEDICARE

## 2020-09-13 DIAGNOSIS — I255 Ischemic cardiomyopathy: Secondary | ICD-10-CM

## 2020-09-13 MED ORDER — FERUMOXYTOL 510 MG IN 50 ML IVPB
510 mg | Freq: Once | INTRAVENOUS | 0 refills | Status: CN
Start: 2020-09-13 — End: ?

## 2020-09-13 MED ORDER — FERUMOXYTOL 510 MG IN 50 ML IVPB
510 mg | Freq: Once | INTRAVENOUS | 0 refills | Status: CP
Start: 2020-09-13 — End: ?
  Administered 2020-09-13 (×2): 510 mg via INTRAVENOUS

## 2020-09-13 NOTE — Progress Notes
Patient received Feraheme: Dose 2/2 per eMAR.     Patient tolerated infusion without complications, no concerns noted at this time.     Patient monitored for 30 minutes post iron infusion. VSS, see below:       09/13/20 1247   Vitals   Temp 36.6 C (97.9 F)   Temperature Source Oral   Pulse 83   Respirations 22 PER MINUTE   SpO2 96 %  (2L)   BP 119/55   BP Source Arm, Right Upper   BP Patient Position Chair       No phlebitis or infiltration noted around IV site at this time.

## 2020-09-13 NOTE — Patient Instructions
Feraheme Injection 30 mg/mL  Uses  For anemia.  Instructions  This medicine is given as an IV injection into a vein.  This medicine is given gradually through the IV line.  Before medicine is mixed, it may be stored at room temperature - below 77 degrees F (25 degrees C).  This medicine should be given by a trained health care provider.  Use this medicine at the same time each day.  Space doses evenly to keep a steady amount of medicine in the body.  You must be monitored by a health care professional during and after each dose is given.  It is important that you keep taking each dose of this medicine on time even if you are feeling well.  If you miss a dose, contact your doctor for instructions.  Please tell your doctor and pharmacist about all the medicines you take. Include both prescription and over-the-counter medicines. Also tell them about any vitamins, herbal medicines, or anything else you take for your health.  If your symptoms do not improve or they worsen while on this medicine, contact your doctor.  It is very important that you follow your doctor's instructions for all blood tests.  It is very important that you keep all appointments for medical exams and tests while on this medicine.  Do not take the medicine more than once during 24 hours.  Cautions  Tell your doctor and pharmacist if you ever had an allergic reaction to a medicine. Symptoms of an allergic reaction can include trouble breathing, skin rash, itching, swelling, or severe dizziness.  Some patients taking this medicine have experienced serious side effects. Please speak with your doctor to understand the risks and benefits associated with this medicine.  This medicine may cause dizziness or fainting, especially after exercising or in hot weather. Be very careful when standing or sitting up quickly.  Your ability to stay alert or to react quickly may be impaired by this medicine. Do not drive or operate machinery until you know how this medicine will affect you.  Please check with your doctor before drinking alcohol while on this medicine.  If possible, avoid using with marijuana or other medicines that can cause dizziness or drowsiness. These include allergy/cold products, muscle relaxers, sleep aids, and pain relievers.  Tell the doctor or pharmacist if you are pregnant, planning to be pregnant, or breastfeeding.  Ask your pharmacist if this medicine can interact with any of your other medicines. Be sure to tell them about all the medicines you take.  Notify X-ray staff during use and for 3 months after. Medicine may interfere with MRI scan.  Please tell all your doctors and dentists that you are on this medicine before they provide care.  Do not start or stop any other medicines without first speaking to your doctor or pharmacist.  Side Effects  The following is a list of some common side effects from this medicine. Please speak with your doctor about what you should do if you experience these or other side effects.  ? dizziness  ? low blood pressure  ? reaction at the area of the injection (pain, redness, swelling)  Call your doctor or get medical help right away if you notice any of these more serious side effects:  ? severe allergic reaction  ? chest pain  ? swelling of the legs, feet, and hands  ? fainting  ? skin irritation such as redness, itching, rash, or burning  ? shortness of breath  ? red,  peeling or blistering skin  ? darkening of skin or nails  A few people may have an allergic reaction to this medicine. Symptoms can include difficulty breathing, skin rash, itching, swelling, or severe dizziness. If you notice any of these symptoms, seek medical help quickly.  Extra  Please speak with your doctor, nurse, or pharmacist if you have any questions about this medicine.  [email or web address]  IMPORTANT NOTE: This document tells you briefly how to take your medicine, but it does not tell you all there is to know about it.Your doctor or pharmacist may give you other documents about your medicine. Please talk to them if you have any questions.Always follow their advice. There is a more complete description of this medicine available in [place].Scan this code on your smartphone or tablet or use the web address below. You can also ask your pharmacist for a printout. If you have any questions, please ask your pharmacist.     ? 2022 [employer]

## 2020-09-14 DIAGNOSIS — E1165 Type 2 diabetes mellitus with hyperglycemia: Secondary | ICD-10-CM

## 2020-09-14 DIAGNOSIS — I5042 Chronic combined systolic (congestive) and diastolic (congestive) heart failure: Secondary | ICD-10-CM

## 2020-09-15 ENCOUNTER — Encounter: Admit: 2020-09-15 | Discharge: 2020-09-15 | Payer: MEDICARE

## 2020-09-15 MED ORDER — GABAPENTIN 100 MG PO CAP
400 mg | ORAL_CAPSULE | Freq: Two times a day (BID) | ORAL | 3 refills | Status: AC
Start: 2020-09-15 — End: ?

## 2020-09-15 NOTE — Telephone Encounter
pts spouse called, called back to see what was needed and spouse declined to speak with this nurse. Spouse reported displeasure with speaking with this nurse due to 2/23 telephone encounter while pt was admitted. Spouse ended call stating she wanted to speak with cardiomems team or APRN Bunnie Philips.

## 2020-09-15 NOTE — Telephone Encounter
[name], patient's wife, called stating patient was out of his gabapentin this morning, so he took [name]'s gabapentin. Patient has 100 mg tablets and takes 4 at a time ( = 400 mg). Patient took 4 of [name]'s gabapentin, but she has 300 mg tablets ( = 1,200 mg). [name] requesting help. Called back urgently to discuss. [name] states patient ingested this dose around 9:00am this morning. Recommended a trip to the emergency department. [name] reports that patient is a bit hazy, but that he went to PT and did fine. They are currently out to eat. Patient reports feeling "simple." Plans to take patient to have blood work done at [facility] after eating. Reports calling Cardiology, who recommended to watch patient closely and wait for Dr. Comfort's recommendations. Told [name] I will route to Dr. Crecencio Mc and flag as urgent for his recommendations and will call back as soon as I know more. Advised her to closely monitor patient in the meantime, and if anything changes to take him to the ER. [name] verbalized understanding with no further questions at this time. Routing to Dr. Crecencio Mc for review and recommendations. Sherrye Payor, RN

## 2020-09-15 NOTE — Telephone Encounter
Called patient's wife, [name], to relay Dr. [employer] recommendations of monitoring patient closely for over-sedation and confusion. Told [name] that Dr. Crecencio Mc recommends no more gabapentin today, but can resume normal regimen tomorrow. If patient displays symptoms of drowsiness, over-sedation, confusion, or altered level of consciousness, he should present to the emergency department. [name] verbalized understanding. States patient's LOC has not changed since this morning. [name] with no further questions at this time. Routing to Dr. Crecencio Mc for his awareness. Sherrye Payor, RN

## 2020-09-15 NOTE — Telephone Encounter
[facility] Coordinator returned patient's earlier call. Reviewed with patient's wife, [name], who stated that they forgot to have labs drawn when at [facility] for iron infusion on Monday, requested lab order be faxed to [facility]. Lab order faxed, per wife's request.

## 2020-09-16 ENCOUNTER — Encounter: Admit: 2020-09-16 | Discharge: 2020-09-16 | Payer: MEDICARE

## 2020-09-16 DIAGNOSIS — I5043 Acute on chronic combined systolic (congestive) and diastolic (congestive) heart failure: Secondary | ICD-10-CM

## 2020-09-16 DIAGNOSIS — I5042 Chronic combined systolic (congestive) and diastolic (congestive) heart failure: Secondary | ICD-10-CM

## 2020-09-16 MED ORDER — BUMETANIDE 1 MG PO TAB
ORAL_TABLET | 3 refills | Status: AC
Start: 2020-09-16 — End: ?

## 2020-09-16 NOTE — Telephone Encounter
-----   Message from Eliberto Ivory, [place] sent at 09/15/2020  5:03 PM CDT -----  With his PAD less than threshold and worsening creatinine/low sodium I would have him not take any additional metolazone for now.  Decrease Bumex to 3 mg in the morning and 2 mg in the afternoon and closely monitor PAD.  Repeat BMP in 1 week.  Thanks, Lowella Bandy  ----- Message -----  From: Rozanna Boer, RN  Sent: 09/15/2020   4:51 PM CDT  To: Eliberto Ivory, APRN-NP    Hi Nikki,  Follow up labs.  Na+ = 129, K =4.2, Cr. = 1.8, GFR 36  Per notes from Dr. Lowella Bandy office today, Pt accidentally took wife's dose of Gabapentin today. (Patient dose = 400 mg/day; but Pt took 1,200 mg by mistake).  PCP aware and pt without symptoms at this time.     Meds:  Metolazone 2.5 mg when instructed by [facility] dose 09/09/20, wife states PCP instructed Pt to take)  Bumex 3mg  TWICE daily  Jardiance 25mg  daily  Spiro 25mg  TWICE daily    7/21 HL Alert but PAD within thresholds. Pt had "severe URI" during that time.    Goal PA Diastolic:14-26mmHg; PA Diastolic Thresholds:12-87mmHg  Date/  PAD  7/27   10 mmHg  7/26   11  7/25    9  7/24    8  7/22    10  7/21    12    Pt has OV with you 09/30/2020.  Thank you,  8/22

## 2020-09-16 NOTE — Telephone Encounter
Spoke with patient's wife regarding lab results and medication changes. She is agreeable to the plan, she requested the bumex script be sent to [facility] and the lab order be faxed to [facility] lab. No further questions or concerns at this time.

## 2020-09-20 ENCOUNTER — Encounter: Admit: 2020-09-20 | Discharge: 2020-09-20 | Payer: MEDICARE

## 2020-09-20 ENCOUNTER — Ambulatory Visit: Admit: 2020-09-20 | Discharge: 2020-09-21 | Payer: MEDICARE

## 2020-09-20 DIAGNOSIS — Z9981 Dependence on supplemental oxygen: Secondary | ICD-10-CM

## 2020-09-20 DIAGNOSIS — N183 CKD (chronic kidney disease) stage 3, GFR 30-59 ml/min (HCC): Secondary | ICD-10-CM

## 2020-09-20 DIAGNOSIS — I2510 Atherosclerotic heart disease of native coronary artery without angina pectoris: Secondary | ICD-10-CM

## 2020-09-20 DIAGNOSIS — M954 Acquired deformity of chest and rib: Secondary | ICD-10-CM

## 2020-09-20 DIAGNOSIS — E782 Mixed hyperlipidemia: Secondary | ICD-10-CM

## 2020-09-20 DIAGNOSIS — J449 Chronic obstructive pulmonary disease, unspecified: Secondary | ICD-10-CM

## 2020-09-20 DIAGNOSIS — I10 Essential (primary) hypertension: Secondary | ICD-10-CM

## 2020-09-20 DIAGNOSIS — Z8679 Personal history of other diseases of the circulatory system: Secondary | ICD-10-CM

## 2020-09-20 DIAGNOSIS — K219 Gastro-esophageal reflux disease without esophagitis: Secondary | ICD-10-CM

## 2020-09-20 DIAGNOSIS — Z8614 Personal history of Methicillin resistant Staphylococcus aureus infection: Secondary | ICD-10-CM

## 2020-09-20 DIAGNOSIS — R0600 Dyspnea, unspecified: Secondary | ICD-10-CM

## 2020-09-20 DIAGNOSIS — I509 Heart failure, unspecified: Secondary | ICD-10-CM

## 2020-09-20 DIAGNOSIS — I5042 Chronic combined systolic (congestive) and diastolic (congestive) heart failure: Secondary | ICD-10-CM

## 2020-09-20 DIAGNOSIS — I714 Abdominal aortic aneurysm, without rupture: Secondary | ICD-10-CM

## 2020-09-20 DIAGNOSIS — J302 Other seasonal allergic rhinitis: Secondary | ICD-10-CM

## 2020-09-20 DIAGNOSIS — R609 Edema, unspecified: Secondary | ICD-10-CM

## 2020-09-20 DIAGNOSIS — Z0000 Encounter for general adult medical examination without abnormal findings: Secondary | ICD-10-CM

## 2020-09-20 DIAGNOSIS — M961 Postlaminectomy syndrome, not elsewhere classified: Secondary | ICD-10-CM

## 2020-09-20 DIAGNOSIS — N401 Enlarged prostate with lower urinary tract symptoms: Secondary | ICD-10-CM

## 2020-09-20 DIAGNOSIS — J45909 Unspecified asthma, uncomplicated: Secondary | ICD-10-CM

## 2020-09-20 DIAGNOSIS — I429 Cardiomyopathy, unspecified: Secondary | ICD-10-CM

## 2020-09-20 DIAGNOSIS — G4733 Obstructive sleep apnea (adult) (pediatric): Secondary | ICD-10-CM

## 2020-09-20 DIAGNOSIS — U071 Pneumonia due to COVID-19 virus: Secondary | ICD-10-CM

## 2020-09-20 DIAGNOSIS — R053 Chronic cough: Secondary | ICD-10-CM

## 2020-09-20 DIAGNOSIS — Z95828 Presence of other vascular implants and grafts: Secondary | ICD-10-CM

## 2020-09-20 DIAGNOSIS — J479 Bronchiectasis, uncomplicated: Secondary | ICD-10-CM

## 2020-09-20 DIAGNOSIS — J189 Pneumonia, unspecified organism: Secondary | ICD-10-CM

## 2020-09-20 DIAGNOSIS — L03116 Cellulitis of left lower limb: Secondary | ICD-10-CM

## 2020-09-20 MED ORDER — BUMETANIDE 1 MG PO TAB
ORAL_TABLET | 3 refills | Status: AC
Start: 2020-09-20 — End: ?

## 2020-09-20 NOTE — Telephone Encounter
Jeannine Boga, APRN-NP  [facility]  Caller: Unspecified (Today, 12:06 PM)    Continue with only Bumex 3 mg daily. If PADs start to trend up could consider afternoon dose.     Thanks,   Anda Kraft

## 2020-09-20 NOTE — Progress Notes
Date of Service: 09/20/2020    Ruben Wood. is a 80 y.o. male.  DOB: 08/16/1940  MRN: [id number]     SUBJECTIVE       He presents today for an Annual Medicare Wellness visit.   Chief Complaint   Patient presents with   ? Physical     Medicare AWV     Patient answers are not available for this visit.      Problem List Review     I have reviewed and updated the problem list below and addressed acute and chronic conditions with patient or recommended a follow up plan.   Patient Active Problem List    Diagnosis   ? Complex care coordination   ? Peripheral vascular disease (HCC)     Chronic   ? Epistaxis   ? Gout   ? Dyspnea   ? Oxygen dependent   ? Chronic combined systolic (congestive) and diastolic (congestive) heart failure (HCC)   ? Chronic bronchitis (HCC)   ? Chronic bronchitis with acute exacerbation (HCC)   ? Moderate episode of recurrent major depressive disorder (HCC)     Chronic   ? Type 2 diabetes mellitus with hyperglycemia, without long-term current use of insulin (HCC)   ? Atrial fibrillation (HCC)   ? Orthostatic hypotension   ? Chronic respiratory failure with hypercapnia (HCC)   ? Depression   ? OAB (overactive bladder)     Chronic   ? Urinary incontinence, urge   ? Coronary artery disease due to lipid rich plaque   ? Bronchiectasis with acute lower respiratory infection (HCC)   ? 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 Benign prostatic hyperplasia (BPH)     Chronic   ? Enrolled in chronic care management   ? History of elevated prostate specific antigen (PSA)     Chronic   ? Spondylolisthesis, lumbar region   ? Lumbar post-laminectomy syndrome   ? Spinal stenosis of lumbosacral region   ? Spondylolisthesis of lumbosacral region   ? Osteoarthritis of spine with radiculopathy, lumbosacral region   ? Hypogammaglobulinemia (HCC)   ? Moderate persistent asthma with acute exacerbation   ? Dermatographic urticaria   ? Recurrent infections   ? S/P left pulmonary artery pressure sensor implant placement (CardioMEMs)    ? Ischemic cardiomyopathy     Chronic   ? ICD (implantable cardioverter-defibrillator), biventricular, in situ     Chronic   ? Hypercholesterolemia     Chronic   ? Mixed restrictive and obstructive lung disease (HCC)     Chronic   ? Bronchiectasis without complication (HCC)   ? Cellulitis of left lower extremity   ? Osteoarthritis of spine with radiculopathy, lumbar region   ? Acute on chronic combined systolic and diastolic congestive heart failure, NYHA class 2 (HCC)   ? 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 Stage 3 chronic kidney disease (HCC)   ? Mixed dyslipidemia     Chronic   ? AAA (abdominal aortic aneurysm) (HCC)     Chronic   ? History of MRSA infection   ? CAD (coronary artery disease), native coronary artery     Chronic   ? OSA on CPAP     Chronic   ? COPD (chronic obstructive pulmonary disease) (HCC)  Chronic   ? Morbid obesity with BMI of 40.0-44.9, adult (HCC)     Chronic   ? Essential hypertension     Chronic   ? Chest wall deformity        Opioid Risk: Pain management plan and non-opioid treatments discussed: Yes   Opioid risk assessment: Does not run out of medications early. Takes as directed    Risk Review   Lab Draw:  Lab Results   Component Value Date/Time    HGBA1C 6.7 (H) 04/04/2020 05:40 AM    HGBA1C 5.9 11/25/2019 02:45 PM    HGBA1C 5.9 09/02/2019 09:50 AM     POC:  No results found for: A1C    Lab Results   Component Value Date    CHOL 161 04/04/2020     The ASCVD Risk score Denman George DC Jr., et al., 2013) failed to calculate for the following reasons:    The 2013 ASCVD risk score is only valid for ages 47 to 94  Preventive Medications   Statin discussion: I have reviewed the patient's ASCVD risk and patient: Patient is on statin    Aspirin discussion:  Patient has history of cardiovascular disease and after risk-benefit discussion: patient is on aspirin and will continue.          Preventive Screening   I reviewed the health maintenance tab with the patient and verified all proof of screenings are present in the chart and ordered outside records as appropriate.    Health Maintenance   Topic Date Due   ? DTAP/TDAP VACCINES (1 - Tdap) Never done   ? DILATED EYE EXAM  Never done   ? SHINGLES RECOMBINANT VACCINE (1 of 2) Never done   ? PHYSICAL (COMPREHENSIVE) EXAM  10/03/2018   ? COVID-19 VACCINE (3 - Moderna risk series) 06/15/2020   ? HBA1C  11/03/2020   ? INFLUENZA VACCINE  12/21/2020   ? MICROALBUMIN  05/18/2021   ? FOOT EXAM  06/15/2021   ? MEDICARE ANNUAL WELLNESS VISIT  09/20/2021   ? PNEUMONIA (PPSV23) VACCINE  Completed   ? ADVANCED CARE PLANNING DISCUSSION AND DOCUMENTATION  Completed       Immunizations     ? COVID vaccine:Not UTD  ? Tdap: Not up-to-date, patient counseled to obtain vaccine at pharmacy  ? Pneumonia vaccine: Not up-to-date, patient counseled to obtain vaccine at pharmacy  ? Shingles: Not up-to-date, patient counseled to obtain vaccine at pharmacy  ? Influenza: Up-to-date    Health Risk Assessment Questionnaire     The patient completed a health risk assessment with results reviewed and addressed with patient.    Health Risk Assessment Questionnaire  Current Care  List of Providers you have seen in the last two years: (not recorded)  Are you receiving home health?: No  During the past 4 weeks, how would you rate your health in general?: (!) Fair    Outside Care  Since your last PCP visit, have you received care outside of [facility]?: No    Physical Activity  Do you exercise or are you physically active?: Yes  How many days a week do you usually exercise or are physically active?: 3  On days when you exercised or were physically active, how many minutes was the activity?: 30  During the past four weeks, what was the hardest physical activity you could do for at least two minutes?: Moderate    Diet  In the past month, were you worried whether your  food would run out before you or your family had money to buy more?: No  In the past 7 days, how many times did you eat fast food or junk food or pizza?: (!) 3 or more times  In the past 7 days, how many servings of fruits or vegetables did you eat each day?: (!) 2-3  In the past 7 days, how many sodas and sugar sweetened drinks (regular, not diet) did you drink each day?: (!) 2    Smoke/Tobacco Use  Are you currently a smoker?: No    Alcohol Use  Do you drink alcohol?: Yes  Are you Male or Male?: Male  Male: In the last three months, have you had >3 alcoholic beverages in any one day or >14 in any one week?: No    Depression Screen  Little interest or pleasure in doing things: Not at All  Feeling down, depressed or hopeless: Not at All    Pain  How would you rate your pain today?: (!) Moderate pain    Ambulation  Do you use any assistive devices for ambulation?: (!) Yes  What types of device? (select all that apply): Walker    Fall Risk  Does it take you longer than 30 seconds to get up and out of a chair?: (!) Yes  Have you fallen in the past year?: (!) Yes  Fall History (last 64mo): (!) Two or More Falls    Scientist, water quality  Do you fasten your seat belt when you are in the car?: Yes    Sun Exposure  Do you protect yourself from the sun? For example, wear sunscreen when outside.: (!) No    Hearing Loss  Do you have trouble hearing the television or radio when others do not?: (!) Yes  Do you have to strain or struggle to hear/understand conversation?: (!) Yes  Do you use hearing aids?: (!) Yes    Cognitive Impairment  During the past 12 months, have you experienced confusion or memory loss that is happening more often or is getting worse?: (!) Yes    Functional Screen  Do you live alone?: No  Do you live at: Home  Can you drive your own car or travel alone by bus or taxi?: (!) No  Can you shop for groceries or clothes without help?: (!) No  Can you prepare your own meals?: (!) No  Can you do your own housework without help?: (!) No  Can you handle your own money without help?: Yes  Do you need help eating, bathing, dressing, or getting around your home?: No  Do you feel safe?: Yes  Does anyone at home hurt you, hit you, or threaten you?: No  Have you ever been the victim of abuse?: No    Home Safety  Does your home have grab bars in the bathroom?: Yes  Does your home have hand rails on stairs and steps?: Yes  Does your home have functioning smoke alarms?: Yes    Advance Directive  Do you have a living will or Advance Directive?: Yes  Are you interested in discussing the importance of a living will or Advance Directive?: No    Dental Screen  Have you had an exam by your dentist in the last year?: (!) No    Vision Screen  Do you have diabetes?: (!) Yes  Getting an eye exam soon    Urinary Incontinence  Have you had urine leakage in the past 6 months?: (!) Yes  Does your urine leakage negatively impact your daily activities or sleep?: (!) Yes   Sees a urologist     Social Determinants of Health     Social Determinants of Health with Concerns     Tobacco Use: Medium Risk   ? Smoking Tobacco Use: Former Smoker   ? Smokeless Tobacco Use: Never Used   Food Insecurity: Not on file   Stress: Not on file       History Review     Past Medical History:   Diagnosis Date   ? AAA (abdominal aortic aneurysm) (HCC) 10/15/2012   ? Asthma    ? BPH with obstruction/lower urinary tract symptoms    ? Bronchiectasis (HCC)    ? CAD (coronary artery disease), native coronary artery 08/15/2012    07/11/2002- Cath @ [facility] showed Severe double vessel disease(OMB of circumflex and distal circ)  inferior-basilar dyskinesis.                 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  09/12/12   Cath    ? Cardiomyopathy (HCC) 07/19/2014   ? Cellulitis of left lower extremity 11/12/2014    10/2014: admitted with cellulitis, [place] negative for venous clot, MRI without osteomyelitis.    11/12/2014 In clinic today, still with cellulitis.  Per patient and his wife, the erythema may be extending.  Cultures from drainage in hospital with MSSA, but Blood Cx negative.  No e/o osteomyelitis.  My concern is that this antibiotic regimen is not adequately treating his cellulitis.  We will broaden coverage to get MRSA with doxycycline.  Patient and wife given strict call/return criteria.  I will see patient in 3-4 days for re-evaluation.  No fever today.  Plan:  Stop cefpodoxime and start doxycylcine  11/17/2014 Much better, no warmth and erythema minimal.   Plan: continue doxycyline for full 10 day course.      ? Chest wall deformity 07/09/2012    Chest wall reconstruction on 07/09/12    ? CHF (congestive heart failure) (HCC)    ? 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 Chronic cough 08/29/2012   ? 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 COPD (chronic obstructive pulmonary disease) (HCC) 08/13/2012   ? Dyspnea    ? Edema    ? GERD (gastroesophageal reflux disease) 08/13/2012   ? History of CHF (congestive heart failure) 08/13/2012   ? History of MRSA infection 10/14/2012   ? 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    ? Hypertension 08/13/2012   ? 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 Morbid obesity (HCC) 08/13/2012   ? On supplemental oxygen therapy    ? OSA on CPAP 08/13/2012   ? Pneumonia 04/2012    [facility]   ? Pneumonia due to COVID-19 virus 12/05/2018   ?  Seasonal allergic reaction      Past Surgical History:   Procedure Laterality Date   ? BACK SURGERY  Jan 2016   ? BRONCHOSCOPY     ? CARDIAC CATHERIZATION  several years ago    [place]   ? CARDIAC DEFIBRILLATOR PLACEMENT  2017   ? DOPPLER ECHOCARDIOGRAPHY     ? HERNIA REPAIR     ? HX CHOLECYSTECTOMY     ? HX ENDOVENOUS ABLATION OF THE SMALL SAPHENOUS VEIN Right 07/06/2016    [facility]   ? HX HEART CATHETERIZATION     ? HX LUMBAR LAMINECTOMY     ? HX MICROPHLEBECTOMY Right 07/06/2016    [facility]   ? HX PACEMAKER PLACEMENT     ? LUNG SURGERY  2015    Bio bridge in left lung/rib cage was broken  from coughing   ? NERVE SURGERY Left 04/30/2018    LEFT ULNAR NERVE DECOMPRESSION AT ELBOW performed by Letitia Neri, MD at [facility]   ? VARICOSE VEIN SURGERY Left 01/05/2016    left small saphenous endovenous ablation with left leg microphlebectomy-Dr. Junita Push      reports that he quit smoking about 22 years ago. His smoking use included cigarettes. He has a 80.00 pack-year smoking history. He has never used smokeless tobacco. He reports previous alcohol use. He reports previously being sexually active. He reports that he does not use drugs.  Family History   Problem Relation Age of Onset   ? Cancer Mother         liver   ? Stroke Sister    ? Cancer Father    ? Coronary Artery Disease Father    ? None Reported Sister      Vaping/E-liquid Use   ? Vaping Use Never User      Vaping/E-liquid Substances   ? CBD No    ? Nicotine No    ? Other No    ? Flavored No    ? THC No    ? Unknown No            Allergies   Allergen Reactions   ? Chlorhexidine BLISTERS and EDEMA     Review of Systems         Review of Systems    OBJECTIVE   There were no vitals filed for this visit.  There is no height or weight on file to calculate BMI. Physical Exam      Patient recalls words: Recalled 3 words, 3 points      Get-up-and-go Test  Greater then 12 seconds, abnormal, consider PT referral    Medications   I have completed a medication reconciliation, discussed medication adherence, and reviewed the list for high-risk medications (BEERS) and results are below:     ? acetaminophen (TYLENOL) 325 mg tablet Take two tablets by mouth every 6 hours as needed for Pain.   ? albuterol 0.083% (PROVENTIL) 2.5 mg /3 mL (0.083 %) nebulizer solution Inhale 3 mL solution by nebulizer as directed every 6 hours as needed for Wheezing or Shortness of Breath. Indications: asthma   ? albuterol sulfate (PROAIR HFA) 90 mcg/actuation HFA aerosol inhaler Inhale two puffs by mouth into the lungs every 4 hours as needed for Wheezing or Shortness of Breath.   ? allopurinoL (ZYLOPRIM) 300 mg tablet Take one tablet by mouth daily. Take with food.   ? AMITR/GABAPEN/EMU OIL 05/24/08% CREAM (COMPOUND) Apply topically to affected area three times daily as needed.   ? arformoteroL (  BROVANA) 15 mcg/2 mL nebulizer solution Inhale 2 mL solution by nebulizer as directed twice daily. Dx: J44.9   ? ASCORBIC ACID-ASCORBATE SODIUM PO Take 500 mg by mouth every morning.   ? atorvastatin (LIPITOR) 40 mg tablet Take one tablet by mouth daily.   ? azelastine (ASTELIN) 137 mcg (0.1 %) nasal spray Apply two sprays to each nostril as directed twice daily. Use in each nostril as directed   ? benzonatate (TESSALON PERLES) 100 mg capsule Take one capsule by mouth every 8 hours.   ? budesonide (PULMICORT) 0.25 mg/2 mL nebulizer solution Inhale 2 mL solution by nebulizer as directed twice daily. Dx: J44.9   ? bumetanide (BUMEX) 1 mg tablet Take 3mg  in the morning and 2mg  in the afternoon.   ? cholecalciferol (VITAMIN D-3) 1,000 units tablet Take 1,000 Units by mouth daily.   ? diclofenac sodium (VOLTAREN) 1 % topical gel Apply four g topically to affected area four times daily.   ? duloxetine DR (CYMBALTA) 60 mg capsule Take one capsule by mouth daily.   ? empagliflozin (JARDIANCE) 25 mg tablet Take one tablet by mouth daily.   ? finasteride (PROSCAR) 5 mg tablet Take one tablet by mouth daily.   ? fish oil- omega 3-DHA/EPA 300/1,000 mg capsule Take 1 capsule by mouth daily.   ? flash glucose scanning reader (FREESTYLE LIBRE) reader Use as directed.   ? flash glucose sensor (FREESTYLE LIBRE 14 DAY SENSOR) sensor Type 2 diabetes  Indications: type 2 diabetes mellitus   ? gabapentin (NEURONTIN) 100 mg capsule Take four capsules by mouth twice daily.   ? insulin pen needles (disposable) (BD ULTRA-FINE MINI PEN NEEDLE) 31 gauge x 3/16 pen needle Use with insulin pens   ? ipratropium bromide (ATROVENT) 21 mcg (0.03 %) nasal spray Apply two sprays to each nostril as directed every 12 hours.   ? metOLazone (ZAROXOLYN) 2.5 mg tablet Take one tablet by mouth daily as needed. PER OUTPATIENT CARDIOLOGY PROVIDER AS NEEDED FOR FLUID RETENTION   ? Miscellaneous Medical Supply misc Vancomycin 1,500 mg IV every 24hours starting 06/25/20 x 4 days (14 total antibiotic days), please draw vancomycin level per your protocol at [facility].   ? Miscellaneous Medical Supply misc Rx: Please wrap legs with ACE bandage from toes as high up to the leg as possible bilaterally  Dx: Lymphedema   ? montelukast (SINGULAIR) 10 mg tablet Take one tablet by mouth at bedtime daily.   ? nitroglycerin (NITROSTAT) 0.4 mg tablet Place 0.4 mg under tongue every 5 minutes as needed for Chest Pain. Max of 3 tablets, call 911.   ? nystatin (MYCOSTATIN) 100,000 unit/g topical cream Apply  topically to affected area twice daily.   ? oxyCODONE (ROXICODONE) 5 mg tablet Take one tablet to two tablets by mouth every 8 hours as needed for Pain   ? pantoprazole DR (PROTONIX) 40 mg tablet Take one tablet by mouth twice daily.   ? polyethylene glycol 3350 (MIRALAX) 17 g packet Take two packets by mouth twice daily.   ? predniSONE (DELTASONE) 20 mg tablet Take two tablets by mouth daily.   ? rivaroxaban (XARELTO) 20 mg tablet Take one tablet by mouth daily with dinner. Take with food.   ? semaglutide (OZEMPIC) 0.25 mg or 0.5 mg(2 mg/1.5 mL) injection PEN Inject one-half mg under the skin every 7 days.   ? senna/docusate (SENOKOT-S) 8.6/50 mg tablet Take one tablet by mouth twice daily.   ? sodium chloride (HYPER-SAL) 3.5 %  inhalation solution Inhale 4 mL by mouth into the lungs twice daily. 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 spironolactone (ALDACTONE) 25 mg tablet Take one tablet by mouth twice daily. Take with food.   ? tamsulosin (FLOMAX) 0.4 mg capsule Take one capsule by mouth daily. Do not crush, chew or open capsules. Take 30 minutes following the same meal each day.   ? traMADoL (ULTRAM) 50 mg tablet Take one tablet by mouth every 8 hours as needed for Pain.   ? zinc sulfate 220 mg (50 mg elemental zinc) capsule Take 220 mg by mouth daily.          ASSESSMENT AND PLAN     ===========================================================  Patient Education Handout (paper copy given to patient today)     Thank you for completing your Medicare Annual Wellness Visit!  Our plan from today:  - We will do an annual wellness visit each year to make sure we are keeping your health items up to date and doing everything we can to keep you safe and healthy  - If you requested or agreed to a nutritionist referral this has been placed, if you do not hear from someone to schedule this please call our main scheduling number at [phone]  - If you requested a social work referral to discuss advanced care planning (living will, DPOA, etc) our social worker will reach out to you    Sun Safety  - The sun's ultraviolet (UV) rays can damage your skin in as little as 15 minutes  - Skin cancer is the most common cancer in the [place]  - Wear sunscreen and sun-protective clothing whenever you will be outside for 15 minutes or longer   - Sunscreen should have an SPF of at least 15 and reapply every 2 hours or after swimming  - When possible, wear clothing that covers your skin, large-brimmed hats to protect your head, face, and ears, sunglasses to protect your eyes, and seek shade as much as possible     Training and development officer  The Facts:  ? Cooking is the primary cause of home fires.  ? Smoking is the leading cause of fire-related deaths.  ? 80% of [place] fire deaths occur in home fires.  ? 50% of home fire deaths occur in homes without smoke alarms.  ? Most home fires occur during winter months.  ? Alcohol contributes to an estimated 40% of home related fire deaths.  Who is at greatest risk?  ? Children under the age of 41.  ? Adults 56 and older.  ? African Americans and Native Americans.  ? Persons living in rural areas.  ? Persons living in manufactured homes or substandard housing.  What can you do?  ? Be sure your home is equipped with a functioning Smoke and Carbon Monoxide alarm.  ? Do not smoke.  ? Do not drink to excess.  ? Monitor your stove, oven, and kitchen appliances.  ? Have a functioning fire extinguisher in your kitchen.    Driving Safety  The Facts:  ? Motor vehicle-related deaths and injuries among older adults are rising.  ? Drivers 69 and older have higher crash death rates per mile than all but teen drivers.  ? The 63 and older population is the fastest growing segment of the population.  ? Older drivers who are injured in a motor vehicle accident are more likely than younger drivers to die of their injuries.  ? Rates for motor vehicle-related injury are twice as high for  older men than for older women.  What can you do?  ? Wear you seatbelt in the car -- all the time.  ? Be sure that your vision and hearing have been tested and are satisfactory.  ? Do not drink and drive.  ? Talk with family about your driving skills and consider their advice when assessing your driving skills and safety.  ? Do not talk on a cell phone and drive at the same time.    Suicide Risk  The Facts:  ? Suicide rates increase with age and rates are high among those over 53.  ? Older adults who are suicidal are also more likely to be suffering from  ? Physical illnesses and to be divorced or widowed.  ? Older men are more likely to commit suicide than older women.  ? Firearms are used in the majority of suicides committed by older adults.  What can you do?  ? Seek care if you are depressed.  ? Seek social supports such as family, church, [employer], and friends if you are ill, divorced, or living alone.  Remember that depression is a medical illness and not a personal failing or weakness.  There are effective treatments for depression and your medical providers are interested in helping you if you are depressed.    Osteoporosis Prevention  The Facts:  ? Osteoporosis is more common among older adults.  ? Women have a higher rate of osteoporosis than men.  ? The presence of osteoporosis increases the risk of injury from a fall.  ? Smoking is a risk factor for the development of osteoporosis.  What can you do?  ? Participate in a regular exercise program such as walking  ? Be sure that you are taking 1,000 to 1,500 mg of calcium per day, preferably with vitamin D  ? Do not smoke.  ? Do not drink alcohol to excess.  ? Consider having a bone density test to look for osteoporosis.    Fall Prevention  The Facts:  ? More than one-third of adults 3 and older fall each year.  ? Among older Americans, falls are the leading cause of injury deaths and the most common cause of non-fatal injury and hospital admission for trauma.  ? Of those older adults who fall, 20% to 30% suffer moderate to severe injuries such as hip fractures or head trauma that reduce mobility and independence, and increase the risk of premature death.  ? Falls are the leading cause of traumatic brain injuries.  ? Among older adults, the majority of fractures are caused by falls.  ? White men have the highest fall-related death rates, followed by white women, black men, and then black women.  ? Women sustain about 80% of all hip fractures.  ? Of all fall-related fractures, hip fractures cause the greatest number of deaths and lead to the most severe health problems and reduced quality of life.  ? Up to 25% of community-dwelling older adults who sustain a hip fracture remain institutionalized for at least one year.  Who is at risk?  ? As noted above, adults over the age of 62 have an increased risk, but that risk is even higher among those older than 2.  ? Those with lower body weakness.  ? Those with problems walking and balance.  ? Those who are on four or more medications or any psychoactive medications.  ? Those who drink excessively.  What can you do?  ? Increase lower body strength  and balance through regular physical activity and exercise.  ? Review all of your medications with your provider regularly to see if any can be eliminated or the dose reduced.  ? Have your vision checked regularly.  ? Remove tripping hazards in your home such as clutter in the hallways and on the stairs.  ? Remove throw rugs.  ? Use non-slip mats in the bathtub and on shower floors.  ? Have grab bars next to the toilet and in the tub or shower.  ? Have handrails on both sides of the stairway  ? Be sure that the lighting is adequate throughout your home.  ? Do not drink alcohol to excess.  ? If you require the assistance of a cane or walker, use it all the time.  ============================================================    Health advice and referrals:   It was discussed with patient  preventive counseling services or programs aimed at reducing identified risk factors and improving self-management, or community-based lifestyle interventions to reduce health risks and promote self-management and wellness, including weight management, physical activity, smoking cessation, fall prevention, and nutrition.    VOLUNTARY: Advance directive :   It was discussed with the patient the importance of advance directive and how the patient can prepare an advance directive in the case where an injury or illness causes the individual to be unable to make health care decisions. The patient was also given the resource and the referral option to our clinic social worker to provide support and guidance on how to get an advance directive.     Staff conducting initial intake: Dionne Milo APRN  I reviewed and approved orders and components above of annual wellness visit and the personalized prevention plan services for this patient.   Personal prevention plan reviewed with patient and is accessible via patient's After Visit Summary and visit note.    Ruben Lame Berenice Bouton. was seen today for physical.    Diagnoses and all orders for this visit:    Medicare welcome visit      No orders of the defined types were placed in this encounter.    There are no discontinued medications.  Patient Instructions     Health Maintenance Due   Topic Date Due   ? DTAP/TDAP VACCINES (1 - Tdap) Never done   ? DILATED EYE EXAM  Never done   ? SHINGLES RECOMBINANT VACCINE (1 of 2) Never done   ? PHYSICAL (COMPREHENSIVE) EXAM  10/03/2018   ? COVID-19 VACCINE (3 - Moderna risk series) 06/15/2020         Thank you for completing your Medicare Annual Wellness Visit!  Our plan from today:  - We will do an annual wellness visit each year to make sure we are keeping your health items up to date and doing everything we can to keep you safe and healthy  - If you requested or agreed to a nutritionist referral this has been placed, if you do not hear from someone to schedule this please call our main scheduling number at [phone]  - If you requested a social work referral to discuss advanced care planning (living will, DPOA, etc) our social worker will reach out to you    Sun Safety  - The sun's ultraviolet (UV) rays can damage your skin in as little as 15 minutes  - Skin cancer is the most common cancer in the [place]  - Wear sunscreen and sun-protective clothing whenever you will be outside for 15 minutes or longer   -  Sunscreen should have an SPF of at least 15 and reapply every 2 hours or after swimming  - When possible, wear clothing that covers your skin, large-brimmed hats to protect your head, face, and ears, sunglasses to protect your eyes, and seek shade as much as possible     Training and development officer  The Facts:  ? Cooking is the primary cause of home fires.  ? Smoking is the leading cause of fire-related deaths.  ? 80% of [place] fire deaths occur in home fires.  ? 50% of home fire deaths occur in homes without smoke alarms.  ? Most home fires occur during winter months.  ? Alcohol contributes to an estimated 40% of home related fire deaths.  Who is at greatest risk?  ? Children under the age of 60.  ? Adults 57 and older.  ? African Americans and Native Americans.  ? Persons living in rural areas.  ? Persons living in manufactured homes or substandard housing.  What can you do?  ? Be sure your home is equipped with a functioning Smoke and Carbon Monoxide alarm.  ? Do not smoke.  ? Do not drink to excess.  ? Monitor your stove, oven, and kitchen appliances.  ? Have a functioning fire extinguisher in your kitchen.    Driving Safety  The Facts:  ? Motor vehicle-related deaths and injuries among older adults are rising.  ? Drivers 42 and older have higher crash death rates per mile than all but teen drivers.  ? The 87 and older population is the fastest growing segment of the population.  ? Older drivers who are injured in a motor vehicle accident are more likely than younger drivers to die of their injuries.  ? Rates for motor vehicle-related injury are twice as high for older men than for older women.  What can you do?  ? Wear you seatbelt in the car -- all the time.  ? Be sure that your vision and hearing have been tested and are satisfactory.  ? Do not drink and drive.  ? Talk with family about your driving skills and consider their advice when assessing your driving skills and safety.  ? Do not talk on a cell phone and drive at the same time.    Suicide Risk  The Facts:  ? Suicide rates increase with age and rates are high among those over 5.  ? Older adults who are suicidal are also more likely to be suffering from  ? Physical illnesses and to be divorced or widowed.  ? Older men are more likely to commit suicide than older women.  ? Firearms are used in the majority of suicides committed by older adults.  What can you do?  ? Seek care if you are depressed.  ? Seek social supports such as family, church, [employer], and friends if you are ill, divorced, or living alone.  Remember that depression is a medical illness and not a personal failing or weakness.  There are effective treatments for depression and your medical providers are interested in helping you if you are depressed.    Osteoporosis Prevention  The Facts:  ? Osteoporosis is more common among older adults.  ? Women have a higher rate of osteoporosis than men.  ? The presence of osteoporosis increases the risk of injury from a fall.  ? Smoking is a risk factor for the development of osteoporosis.  What can you do?  ? Participate in a regular exercise program such as  walking  ? Be sure that you are taking 1,000 to 1,500 mg of calcium per day, preferably with vitamin D  ? Do not smoke.  ? Do not drink alcohol to excess.  ? Consider having a bone density test to look for osteoporosis.    Fall Prevention  The Facts:  ? More than one-third of adults 11 and older fall each year.  ? Among older Americans, falls are the leading cause of injury deaths and the most common cause of non-fatal injury and hospital admission for trauma.  ? Of those older adults who fall, 20% to 30% suffer moderate to severe injuries such as hip fractures or head trauma that reduce mobility and independence, and increase the risk of premature death.  ? Falls are the leading cause of traumatic brain injuries.  ? Among older adults, the majority of fractures are caused by falls.  ? White men have the highest fall-related death rates, followed by white women, black men, and then black women.  ? Women sustain about 80% of all hip fractures.  ? Of all fall-related fractures, hip fractures cause the greatest number of deaths and lead to the most severe health problems and reduced quality of life.  ? Up to 25% of community-dwelling older adults who sustain a hip fracture remain institutionalized for at least one year.  Who is at risk?  ? As noted above, adults over the age of 56 have an increased risk, but that risk is even higher among those older than 46.  ? Those with lower body weakness.  ? Those with problems walking and balance.  ? Those who are on four or more medications or any psychoactive medications.  ? Those who drink excessively.  What can you do?  ? Increase lower body strength and balance through regular physical activity and exercise.  ? Review all of your medications with your provider regularly to see if any can be eliminated or the dose reduced.  ? Have your vision checked regularly.  ? Remove tripping hazards in your home such as clutter in the hallways and on the stairs.  ? Remove throw rugs.  ? Use non-slip mats in the bathtub and on shower floors.  ? Have grab bars next to the toilet and in the tub or shower.  ? Have handrails on both sides of the stairway  ? Be sure that the lighting is adequate throughout your home.  ? Do not drink alcohol to excess.  ? If you require the assistance of a cane or walker, use it all the time.  ============================================================

## 2020-09-20 NOTE — Telephone Encounter
Symptom PC to pt as PAD remains below thresholds. Pt taking medications as prescribed in med list and denies any concerns at this time Weight this AM was 290.2 lbs.        DAILY CardioMEMS READINGS    Goal PA Diastolic: 15-16 mmHg  PA Diastolic Thresholds: 12-17 mmHg          CARDIAC MEDS:    Home Medications    Medication Sig   bumetanide (BUMEX) 1 mg tablet Take 3mg  in the morning and 2mg  in the afternoon.   empagliflozin (JARDIANCE) 25 mg tablet Take one tablet by mouth daily.   metOLazone (ZAROXOLYN) 2.5 mg tablet Take one tablet by mouth daily as needed. PER OUTPATIENT CARDIOLOGY PROVIDER AS NEEDED FOR FLUID RETENTION   rivaroxaban (XARELTO) 20 mg tablet Take one tablet by mouth daily with dinner. Take with food.   spironolactone (ALDACTONE) 25 mg tablet Take one tablet by mouth twice daily. Take with food.       RECENT LABS:    Basic Metabolic Profile    Lab Results   Component Value Date/Time    NA 129 (L) 09/15/2020 12:00 AM    K 4.2 09/15/2020 12:00 AM    CA 10 09/15/2020 12:00 AM    CL 90 (L) 09/15/2020 12:00 AM    CO2 32.9 (H) 09/15/2020 12:00 AM    GAP 11 08/05/2020 02:09 PM    Lab Results   Component Value Date/Time    BUN 51 (H) 09/15/2020 12:00 AM    CR 1.8 (H) 09/15/2020 12:00 AM    GLU 118 (H) 09/15/2020 12:00 AM          UPCOMING APPOINTMENTS:    Future Appointments   Date Time Provider Department Center   09/30/2020  1:30 PM 09/17/2020, APRN-NP [facility] CVM Exam   10/20/2020 11:30 AM [facility] Radiology   10/27/2020  1:30 PM 10/22/2020, MD [facility] CVM Exam   10/27/2020  2:30 PM PAC ROOM 7 PRE None   11/08/2020  8:00 AM [place] [facility] CVM Procedur   12/06/2020  4:00 PM [place], 11/10/2020, MD [facility] IM   06/02/2021  1:00 PM [name], [name], PA-C [facility] Urology      n

## 2020-09-20 NOTE — Telephone Encounter
PC to pt. Pt answered stating his wife is outdoors and can't talk right now but he understands to take just the AM dose of Bumex 3mg  a day and continue to send CardioMEMS Readings daily. We will continue to monitor.

## 2020-09-27 ENCOUNTER — Encounter: Admit: 2020-09-27 | Discharge: 2020-09-27 | Payer: MEDICARE

## 2020-09-27 MED ORDER — TRAMADOL 50 MG PO TAB
50 mg | ORAL_TABLET | ORAL | 0 refills | Status: AC | PRN
Start: 2020-09-27 — End: ?

## 2020-09-27 MED ORDER — NYSTATIN 100,000 UNIT/GRAM TP CREA
Freq: Two times a day (BID) | TOPICAL | 3 refills | 30.00000 days | Status: AC
Start: 2020-09-27 — End: ?

## 2020-09-27 NOTE — Telephone Encounter
Received a refill request from patient for Tramadol and Nystatin powder  Tramadol last filled 06/15/20 #30 with 0 refills  Nystatin last filled 06/28/20 #30 g with 3 refills  Lind Covert, RN

## 2020-09-28 ENCOUNTER — Encounter: Admit: 2020-09-28 | Discharge: 2020-09-28 | Payer: MEDICARE

## 2020-09-28 DIAGNOSIS — I5042 Chronic combined systolic (congestive) and diastolic (congestive) heart failure: Secondary | ICD-10-CM

## 2020-09-28 LAB — BASIC METABOLIC PANEL
BLD UREA NITROGEN: 40 g/dL — ABNORMAL HIGH (ref 7.0–18)
CALCIUM: 10 MMOL/L (ref 8.5–10.5)
CHLORIDE: 98 g/dL — ABNORMAL HIGH (ref 98–106)
CO2: 31 mg/dL (ref 21–32)
CREATININE: 1.7 U/L — ABNORMAL HIGH (ref 0.8–1.0)
GFR ESTIMATED: 41 U/L (ref 7–56)
GLUCOSE,PANEL: 118 U/L — ABNORMAL HIGH (ref 65–110)
POTASSIUM: 4 mg/dL — ABNORMAL LOW (ref 3.6–5.2)
SODIUM: 138 mg/dL — ABNORMAL HIGH (ref 137–150)

## 2020-09-29 ENCOUNTER — Encounter: Admit: 2020-09-29 | Discharge: 2020-09-29 | Payer: MEDICARE

## 2020-09-29 NOTE — Telephone Encounter
Called [facility] and spoke to [place] who stated patient has been on O2 services for over 35 months, which places him outside the window with insurance. Questioned why POC was not provided to patient at time of initial order. Informed that patient mainly used O2 at night or as needed. Only recently within past year has patient been using continuously and POC was never ordered for patient. Patient currently has M-6 and E-tanks but Jilda Panda has been asking patient to also use M-9 tanks when doing therapy as M-9 tank provided better O2 pressure to patient than M-6. Patient will be able to obtain POC in a couple years, as insurance will then cover.       Routing to [employer], Charity fundraiser

## 2020-09-29 NOTE — Telephone Encounter
PC to pt. LVM on preferred phone # with provider notes below. Also sent MyChart message with full details of labs and provider recommendations as pt is active on mychart 09/28/20.

## 2020-09-30 ENCOUNTER — Encounter: Admit: 2020-09-30 | Discharge: 2020-09-30 | Payer: MEDICARE

## 2020-09-30 ENCOUNTER — Ambulatory Visit: Admit: 2020-09-30 | Discharge: 2020-09-30 | Payer: MEDICARE

## 2020-09-30 DIAGNOSIS — J302 Other seasonal allergic rhinitis: Secondary | ICD-10-CM

## 2020-09-30 DIAGNOSIS — J189 Pneumonia, unspecified organism: Secondary | ICD-10-CM

## 2020-09-30 DIAGNOSIS — N183 CKD (chronic kidney disease) stage 3, GFR 30-59 ml/min (HCC): Secondary | ICD-10-CM

## 2020-09-30 DIAGNOSIS — R053 Chronic cough: Secondary | ICD-10-CM

## 2020-09-30 DIAGNOSIS — M954 Acquired deformity of chest and rib: Secondary | ICD-10-CM

## 2020-09-30 DIAGNOSIS — R609 Edema, unspecified: Secondary | ICD-10-CM

## 2020-09-30 DIAGNOSIS — I2510 Atherosclerotic heart disease of native coronary artery without angina pectoris: Secondary | ICD-10-CM

## 2020-09-30 DIAGNOSIS — Z9981 Dependence on supplemental oxygen: Secondary | ICD-10-CM

## 2020-09-30 DIAGNOSIS — U071 Pneumonia due to COVID-19 virus: Secondary | ICD-10-CM

## 2020-09-30 DIAGNOSIS — I5042 Chronic combined systolic (congestive) and diastolic (congestive) heart failure: Secondary | ICD-10-CM

## 2020-09-30 DIAGNOSIS — N401 Enlarged prostate with lower urinary tract symptoms: Secondary | ICD-10-CM

## 2020-09-30 DIAGNOSIS — J45909 Unspecified asthma, uncomplicated: Secondary | ICD-10-CM

## 2020-09-30 DIAGNOSIS — G4733 Obstructive sleep apnea (adult) (pediatric): Secondary | ICD-10-CM

## 2020-09-30 DIAGNOSIS — I509 Heart failure, unspecified: Secondary | ICD-10-CM

## 2020-09-30 DIAGNOSIS — D509 Iron deficiency anemia, unspecified: Secondary | ICD-10-CM

## 2020-09-30 DIAGNOSIS — Z8614 Personal history of Methicillin resistant Staphylococcus aureus infection: Secondary | ICD-10-CM

## 2020-09-30 DIAGNOSIS — J449 Chronic obstructive pulmonary disease, unspecified: Secondary | ICD-10-CM

## 2020-09-30 DIAGNOSIS — K219 Gastro-esophageal reflux disease without esophagitis: Secondary | ICD-10-CM

## 2020-09-30 DIAGNOSIS — I714 Abdominal aortic aneurysm, without rupture: Secondary | ICD-10-CM

## 2020-09-30 DIAGNOSIS — E782 Mixed hyperlipidemia: Secondary | ICD-10-CM

## 2020-09-30 DIAGNOSIS — I429 Cardiomyopathy, unspecified: Secondary | ICD-10-CM

## 2020-09-30 DIAGNOSIS — I10 Essential (primary) hypertension: Secondary | ICD-10-CM

## 2020-09-30 DIAGNOSIS — M961 Postlaminectomy syndrome, not elsewhere classified: Secondary | ICD-10-CM

## 2020-09-30 DIAGNOSIS — L03116 Cellulitis of left lower limb: Secondary | ICD-10-CM

## 2020-09-30 DIAGNOSIS — I255 Ischemic cardiomyopathy: Secondary | ICD-10-CM

## 2020-09-30 DIAGNOSIS — Z8679 Personal history of other diseases of the circulatory system: Secondary | ICD-10-CM

## 2020-09-30 DIAGNOSIS — I739 Peripheral vascular disease, unspecified: Secondary | ICD-10-CM

## 2020-09-30 DIAGNOSIS — R0600 Dyspnea, unspecified: Secondary | ICD-10-CM

## 2020-09-30 DIAGNOSIS — Z95828 Presence of other vascular implants and grafts: Secondary | ICD-10-CM

## 2020-09-30 DIAGNOSIS — J479 Bronchiectasis, uncomplicated: Secondary | ICD-10-CM

## 2020-09-30 MED ORDER — MISCELLANEOUS MEDICAL SUPPLY MISC MISC
1 refills | 1.00000 days | Status: AC
Start: 2020-09-30 — End: ?

## 2020-09-30 NOTE — Progress Notes
Date of Service: 09/30/2020    Ruben Wood. Ruben Wood is a 80 y.o. male.   He follows with Dr. Vanetta Shawl.    HPI     Ruben Wood. is seen today in the heart failure clinic in a follow-up visit.  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     Ruben Wood was hospitalized at [facility] 2/11-04/26/2020 for acute asthma exacerbation who presented again?with progressive shortness of breath and cough that did not improve with 5 day doxycycline course, ultimately?requiring transfer to the ICU on 2/17.?PTA diuretic regimen was increased and patient was started on?antipseudomonal?antibiotic course. He was subsequently weaned off comfort flow and respiratory status stable on HFNC approaching baseline O2 requirement.?Diuretics back to PTA dose.  He was started on anticoagulation with Xarelto for his PAF.  He was treated for left lower extremity cellulitis just prior to discharge and was discharged with Keflex to finish 7 days. He was discharged to home with PT.    Patient's wife did report on 3/18 that patient had admission to [facility] 3/13-3/17 for epistaxis.  His Xarelto was held x4 days and then resumed.  He was treated with IV and oral antibiotics for possible aspiration pneumonia.    He did meet with Dr. Betti Cruz on 4/11 to discuss watchman LAA closure device placement.  He believes that the patient is a good candidate and the patient does wish to proceed.  The procedure is currently scheduled for 11/08/2020 with preprocedure visit with Dr. Betti Cruz on 10/27/2020.    On 6/6 Dr. Crecencio Mc treated pt for asthma exacerbation and treated with prednisone taper over 20 days. He also had some abx and prednisone from Dr. [name] 7/8.      He was seen in a follow up visit on 08/05/2020 with iron panel indicating iron deficiency with a recommendation for IV iron which was received on 7/14 and 7/25.  On 7/22 he did take a dose of metolazone.  Over the last several weeks his CardioMEMS PAD readings have been low and his Bumex has been cut back to most recently 3 mg daily.    Ruben Puma. presents today accompanied by his wife, Ruben Wood.  He reports that recently he has noticed an increase in shortness of breath and dyspnea on exertion which he attributes to seasonal allergies and the heat.  He does have lower extremity edema with flaking and serous fluid leakage.  He reports that he is having some increased sensitivity and pain particularly in his left calf.  He continues to attend physical therapy.  He denies any lightheadedness, chest pain, palpitation or heart racing.         Vitals:    09/30/20 1246   BP: 107/59   BP Source: Arm, Left Upper   Pulse: 85   SpO2: 95%   O2 Device: Nasal cannula   O2 Liter Flow: 2 Lpm   PainSc: Zero   Weight: 132.9 kg (293 lb)   Height: 180.3 cm (5' 11)     Body mass index is 40.87 kg/m?[name]     Past Medical History  Patient Active Problem List    Diagnosis Date Noted   ? Complex care  coordination 05/04/2017     Priority: High     Class: Acute   ? Iron deficiency anemia 09/30/2020   ? Peripheral vascular disease (HCC) 06/22/2020   ? Epistaxis 05/14/2020     Was a problem after starting anticoagulation 04/2020, but this has been an ongoing issue for him. On O2 via NC at baseline.         ? Gout 04/03/2020   ? Dyspnea 04/02/2020   ? 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 Chronic bronchitis (HCC) 11/25/2019   ? Chronic bronchitis with acute exacerbation (HCC) 12/05/2018   ? 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 Type 2 diabetes mellitus with hyperglycemia, without long-term current use of insulin (HCC) 03/12/2018   ? Atrial fibrillation (HCC) 03/12/2018   ? Orthostatic hypotension 12/28/2017   ? Chronic respiratory failure with hypercapnia (HCC) 11/16/2017   ? Depression 11/15/2017   ? OAB (overactive bladder)      Urinary frequency, urgency, urge urinary incontinence (UUI), nocturia, nocturnal enuresis.  -- trial Mirabegron 25 mg --> improved, but persistent sx's.  -- trial Mirabegron 50 mg.     ? Urinary incontinence, urge      See OAB A&P note.     ? Coronary artery disease due to lipid rich plaque 06/29/2017   ? Bronchiectasis with acute lower respiratory infection (HCC) 05/10/2017   ? 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     ? Benign prostatic hyperplasia (BPH) 03/01/2017     TRUS Prostate (03/01/2017): Prostate volume = 38.3 mL.  D/c'd Tamsulosin d/t lack of symptom improvement.     ? Enrolled in chronic care management 02/28/2017   ? History of elevated prostate specific antigen (PSA)      PNBx (03/01/2017): (L) high-grade prostatic intraepithelial neoplasia (HG PIN), 1/6 cores; [name], PA-C.     ? Spondylolisthesis, lumbar region 12/13/2016   ? Lumbar post-laminectomy syndrome      L4-5     ? Spinal stenosis of lumbosacral region 09/20/2016   ? Spondylolisthesis of lumbosacral region 09/20/2016   ? Osteoarthritis of spine with radiculopathy, lumbosacral region 09/20/2016   ? 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 He had a normal response to pneumococca vaccination; he had 17/23 positive serotypes. He has normal tetanus toxoid Ab and CH50.  His T&B cell panel revealed a mildly low B-cell count at 68 right after a time of acute illness.  He had positive diphtheria antibody.    Likely multifactorial with significant comorbidities including frequent steroid use for bronchiectasis exacerbations, frequent infections, and possibly a nutritional component (low total protein).    Previously on doxycycline once daily for recurrent infections, which he initially felt had been tremendously helpful, but he has had a couple episodes of what may be pneumonia in early 2022 when he was off.     Plan:  - Restart doxycycline 100 mg daily.  - Recommend getting the Pneumovax immunization next time he is in clinic in person and will need repeat pneumococcal antibody levels 1 month afterwards once he has had resolution of his COVID issues.          ? Moderate persistent asthma with acute exacerbation 02/25/2016     He has  had asthma since sometime in his 20-30s. He gets cough, chest tightness, wheezing, shortness of breath that is year round with worsening in the Spring and Fall.   Triggers: URI, hot/cold air. Additionally he has been diagnosed with bronchiectasis and emphysema; his PFTs suggest both obstructive and restrictive disease and asthma is unlikely to be the sole cause of his dyspnea.    He has negative IgE immunocaps to aeroallergens which makes the possibility of allergic asthma highly unlikely. His IgE level was 17 and he had negative ANCAs, MPO and PR3.     Repeatedly being treated with steroids now for flares.  Also on hypertonic saline nebs, DuoNebs, budesnoide/Brovana via neb, and Singulair as well as aggressive pulmonary clearance with vest and Aerobika.        ? 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 He is not having issues with urticaria.    - We recommend IgE immunocaps to aeroallergens.      ? Recurrent infections 02/25/2016     Recurrent sinopulmonary infections requiring antibiotics 5-10 times per year.   Cellulitis and non-healing wounds.   Many underlying illnesses predisposing him to these infections in addition to intermittent hypogammaglobulinemia associated with active infections and systemic steroids.       ? S/P left pulmonary artery pressure sensor implant placement (CardioMEMs)  02/02/2016     * Patient has CardioMEMs (Pulmonary Artery Pressure Sensor)* Please call Matthew Folks, CardioMEMs Program Coordinator [phone] or page Heart Failure Rounding Team if patient is admitted or presents to Ruben Wood*  03/30/15 implant     ? Ischemic cardiomyopathy 01/12/2016   ? ICD (implantable cardioverter-defibrillator), biventricular, in situ 12/07/2015   ? Hypercholesterolemia 11/25/2015   ? 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 Bronchiectasis without complication (HCC) 06/18/2015     Non-sputum producer.  Currently on Symbicort.  - Dr. [name] was able to get him a vest to wear at home for secretions and this has helped immensely with his secretions and breathing.      ? Cellulitis of left lower extremity 11/12/2014     10/2014: admitted with cellulitis, [place] negative for venous clot, MRI without osteomyelitis.      11/12/2014 In clinic today, still with cellulitis.  Per patient and his wife, the erythema may be extending.  Cultures from drainage in hospital with MSSA, but Blood Cx negative.  No e/o osteomyelitis.  My concern is that this antibiotic regimen is not adequately treating his cellulitis.  We will broaden coverage to get MRSA with doxycycline.  Patient and wife given strict call/return criteria.  I will see patient in 3-4 days for re-evaluation.  No fever today.   Plan:  Stop cefpodoxime and start doxycylcine    11/17/2014 Much better, no warmth and erythema minimal.    Plan: continue doxycyline for full 10 day course.        ? Osteoarthritis of spine with radiculopathy, lumbar region 07/28/2014   ? Acute on chronic combined systolic and diastolic congestive heart failure, NYHA class 2 (HCC) 07/19/2014     11/04/14: EF 20%, severely dilated LV with grade 1 diastolic dysfunction.  11/12/2014 In clinic today, patient  appears fairly well compensated.  He does have some LEE, but no significant rales (just some at bases), no JVD sitting upright, and no sx to suggest decompensation.  We will continue metoprolol, spironolactone, and bumex at current doses.    11/17/2014 Edema much improved in lower ext, Cr trending down on last labs.  Will recheck labs today.  Advised to weigh himself daily.  Rechecking BMP today to ensure not overdoing diuretics.     On metoprolol, spironolactone.  Will need to explore allergy to ARB more as patient may benefit from ACEI.      03/30/15 CardioMEMS implant by Dr. Chales Abrahams  1. Normal cardiac output and cardiac index.  2. Normal pulmonary pressures and normal pulmonary capillary wedge pressure.  3. Successful insertion of CardioMEMS pulmonary artery pressure sensor     ? 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      ? Stage 3 chronic kidney disease (HCC) 08/26/2013   ? Mixed dyslipidemia 08/26/2013     Hepatic Function    Lab Results   Component Value Date/Time    ALBUMIN 4.0 11/04/2014 12:26 AM    TOTAL PROTEIN 6.9 11/04/2014 12:26 AM    ALK PHOSPHATASE 61 11/04/2014 12:26 AM    Lab Results   Component Value Date/Time    AST (SGOT) 41* 11/04/2014 12:26 AM    ALT (SGPT) 19 11/04/2014 12:26 AM    TOTAL BILIRUBIN 1.4* 11/04/2014 12:26 AM        Lab Results Component Value Date    CHOL 148 12/15/2013    TRIG 122 12/15/2013    HDL 51 12/15/2013    LDL 88 12/15/2013    VLDL 24 12/15/2013    NONHDLCHOL 97 12/15/2013       BP Readings from Last 3 Encounters:   11/12/14 129/80   11/09/14 111/44   11/04/14 146/65     Plan:   80 y.o. with known CAD with CTO of RCA and obtuse marginal branch with high grade stenosis (90%) in mid left circ.    Advised heart healthy diet and daily exercise.    Continue high intensity statin, atorvastatin       ? AAA (abdominal aortic aneurysm) (HCC) 10/15/2012     Duplex done at OSH in 2007 and 2008 ~ 4.1 cm    Duplex 10/15/12-AAA 7.3 cm AP x 7.3 cm       ? History of MRSA infection 10/14/2012   ? CAD (coronary artery disease), native coronary artery 08/15/2012     07/11/2002- Cath @ [facility] showed Severe double vessel disease(OMB of circumflex and distal circ)  inferior-basilar dyskinesis.                  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    09/12/12   Cath - [place]:  CTO of the right coronary artery, CTO of the obtuse marginal branch and high-grade stenosis of   90% in the mid left  circumflex artery No significant stenosis in the left anterior descending artery or left main vessel     Failed attempt in opening 2nd obtuse marginal CTO as described above.    10/14/12: Unsuccessful attempt to open CTO of OMB and mid-CFX by Dr. Mackey Birchwood      04/02/17: Cath by Dr. Steward Ros:   4. Chronic total occlusion of the circumflex.  5. Chronic total occlusion of the right coronary artery, status post percutaneous coronary intervention with drug-eluting stent times 2.  6. Small distal right coronary artery perforation without echo or hemodynamic evidence of compromise.    06/29/17-cardiac catheterization by Dr. Steward Ros. One DES placed to chronic total occlusion of the circumflex artery. The RCA stent was patent.     ? OSA on CPAP 08/13/2012     CPAP at  DME: [facility]    Split night:  08/14/2017  AHI 43.2  REM n/a  Time below 88 %: 98 mins  Lowest sat: 83%     CPAP 10 cm h20 effective      ? COPD (chronic obstructive pulmonary disease) (HCC) 08/13/2012     04/28/13: PFTs with more restrictive pattern with FEV1 50%, FEV1/FVC 89%, DLCO 65%.    12/27/13: PFTs with FEV1 82%, FEV1/FVC 113%, and DLCO 77%     ? 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  Discussed the importance of diet.  Exercise as tolerated.        ? Essential hypertension 08/13/2012   ? Chest wall deformity 07/09/2012     Chest wall reconstruction on 07/09/12  Lung herniation- bio bridge         ROS-Per HPI    Physical Exam  General Appearance: In NAD, obese  Neck Veins: normal JVP, neck veins are not distended; no HJR   Chest Inspection: chest is normal in appearance   Respiratory Effort: breathing comfortably, no respiratory distress   Auscultation/Percussion: lungs diminished but clear to auscultation, faint chronic bilat rales, no rhonchi, exp wheezing with deep breathing   Cardiac Rhythm: regular rhythm and normal rate   Cardiac Auscultation: S1, S2 normal, no rub, no definite S3  or S4   Murmurs: no murmur   Peripheral Circulation: normal peripheral circulation   Pedal Pulses: normal symmetric pedal pulses   Lower Extremity Edema: Trace-1+ lower extremity edema, chronic venous discoloration with some serous drainage  Abdominal Exam: soft, non-tender, no obvious masses, bowel sounds normal   Gait & Station: using a wheelchair  Orientation: oriented to person, place and time   Affect & Mood: appropriate and sustained affect   Language and Memory: patient responsive and seems to comprehend information   Neurologic Exam: neurological assessment grossly intact   Vital signs were reviewed    Cardiovascular Studies    Echocardiogram?11/26/19: LV severely dilated, severe predominantly basal septal hypertrophy, LV EF 35%, unable to assess diastolic function.  RV moderately dilated with normal wall thickness and systolic function.  IVS 1.79 cm.  LA moderately dilated, RA mildly dilated.  CVP 0-5 mmHg.  MVR moderate-severe, eccentric regurgitation, mean gradient 3-4 mmHg.  Mild TVR.  PASP 37 mmHg.    Cardiovascular Health Factors  Vitals BP Readings from Last 3 Encounters:   09/30/20 107/59  09/13/20 119/55   08/27/20 115/48     Wt Readings from Last 3 Encounters:   09/30/20 132.9 kg (293 lb)   08/27/20 131.3 kg (289 lb 8 oz)   08/12/20 135.6 kg (299 lb)     BMI Readings from Last 3 Encounters:   09/30/20 40.87 kg/m?   08/27/20 40.38 kg/m?   08/12/20 41.70 kg/m?      Smoking Social History     Tobacco Use   Smoking Status Former Smoker   ? Packs/day: 2.00   ? Years: 40.00   ? Pack years: 80.00   ? Types: Cigarettes   ? Quit date: 07/09/1998   ? Years since quitting: 22.2   Smokeless Tobacco Never Used      Lipid Profile Cholesterol   Date Value Ref Range Status   04/04/2020 161 <200 MG/DL Final     HDL   Date Value Ref Range Status   04/04/2020 53 >40 MG/DL Final     LDL   Date Value Ref Range Status   04/04/2020 87 <100 mg/dL Final     Triglycerides   Date Value Ref Range Status   04/04/2020 152 (H) <150 MG/DL Final      Blood Sugar Hemoglobin A1C   Date Value Ref Range Status   04/04/2020 6.7 (H) 4.0 - 6.0 % Final     Comment:     The ADA recommends that most patients with type 1 and type 2 diabetes maintain   an A1c level <7%.       Glucose   Date Value Ref Range Status   09/27/2020 118 (H) 65 - 110 Final   09/15/2020 118 (H) 65 - 110 Final   08/05/2020 84 70 - 100 MG/DL Final     Glucose Fasting   Date Value Ref Range Status   12/15/2014 111  Final     Glucose, POC   Date Value Ref Range Status   04/26/2020 172 (H) 70 - 100 MG/DL Final   16/11/9602 540 (H) 70 - 100 MG/DL Final   98/12/9145 829 (H) 70 - 100 MG/DL Final          Problems Addressed Today  Encounter Diagnoses   Name Primary?   ? Chronic combined systolic (congestive) and diastolic (congestive) heart failure (HCC) Yes   ? Ischemic cardiomyopathy    ? Stage 3 chronic kidney disease, unspecified whether stage 3a or 3b CKD (HCC)    ? Iron deficiency anemia, unspecified iron deficiency anemia type        Assessment and Plan     ?  Chronic?combined systolic and diastolic?HFrEF, ?EF: 35%.  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 ?RV moderately dilated with normal wall thickness and systolic function. ?IVS 1.79 cm. ?LA moderately dilated, RA mildly dilated. ?CVP 0-5 mmHg. ?MVR moderate-severe, eccentric regurgitation, mean gradient 3-4 mmHg. ?Mild TVR. ?PASP 37 mmHg.  - 12/02/19 RHC shows RA 9, RV 37/8, PA 33/19 (24), PA sat 63%, PCWP 14, CO/CI (Fick) 6.51/2.58, CO/CI (TDL) 7.9/3.14, TPG 10, PVR 1.5, SVR 1056  -NYHA class III, stage C.  The patient's weight on the office scale today is down 6.4 lbs since his last heart failure clinic visit.  The patient appears euvolemic on examination.  - CardioMEMS in situ, and the PA diastolic goal 14-15 with threshold 12-17.  PAD is 9 today, and his range has been 9-14 for the last 3  weeks.     >Diuretic Therapy   Current Dose:  Bumex 3 mg daily+ metolazone 2.5 mg as needed when instructed Changes:   None today         GDMT PTA Changes   BB None-due to bronchiectasis ?   ACEI/ARB/ARNI N/A-renal dysfunction ?   Aldosterone Antagonist Spironolactone 25 mg bid     SGLT2 Inhibitor  Jardiance 25 mg daily     Hydralazine/Nitrate N/A ?   Ivabradine N/A ?   HRMT CRT-D ?   Anticoagulation for Afib/flutter N/A  ?   Cardiac Rehab patient participating in physical therapy ?   > BMP on 9/7    CKD, stage III  - Baseline creatinine 1.3-1.7  -Last creatinine 1.7 on 09/27/2020  > BMP on 9/7    Anemia  -Hemoglobin typically runs 10.7-11.2.  -Last iron panel 08/05/2020: Total iron 43, 11% saturation, 389 TIBC, ferritin 24  -IV iron received on 7/14 and 09/13/20  > Repeat CBC and iron panel on 9/7    Orthostatic hypotension  -Patient denies any further episodes since late May  > Continue physical therapy  ?  CRT-D in situ  - Placed 11/25/2015 by Dr. Betti Cruz  -Remote device interrogation 09/09/2020: BiV pacing 93%, AP 9%.  12 atrial high rate events, EGM suggest AT with duration of <12 seconds.  AT/AF burden 1%. ?5 episodes of NSVT lasting anywhere from 4-8 seconds.  PVCs 3.9%, VS 3%.  ?  Paroxysmal AFib/Flutter  -Started on Xarelto 20 mg daily during hospitalization February-March 2022.    -He denies any further epistaxis or signs or symptoms of bleeding since discharge from [facility] on 3/17.  -Most recent device interrogation as above  - CHADs2VASc score 5 (HF,HTN, age, DM2)  -Patient met with Dr. Betti Cruz on 05/31/2020 to discuss watchman LAA closure device and he does wish to proceed.  Dr. Betti Cruz believes patient is a good candidate.  > Watchman LAA closure device scheduled for 11/08/2020 with preprocedure visit on 10/27/2020 with Dr. Betti Cruz  > Continue Xarelto 20 mg daily    ?  CAD  -?LHC 06/29/17: DES to the CTO left circumflex obtuse marginal-2 by Dr. Alden Server. ?Patent stent in proximal RCA, 30% distal left main stenosis with circumferential calcium noted on the OCT-?Prior left cardiac catheterization 04/02/17 with DES x2 to the RCA w/ small distal?RCA perforation, trace?pericardial effusion;?known total occlusion of the CX.??Left main and LAD have 30-40%?dz.?  >?Continue ASA 81 mg, atorvastatin 40 mg daily.    HLP  Lab Results   Component Value Date/Time    CHOL 161 04/04/2020 05:40 AM    TRIG 152 (H) 04/04/2020 05:40 AM    HDL 53 04/04/2020 05:40 AM    LDL 87 04/04/2020 05:40 AM    VLDL 30 04/04/2020 05:40 AM NONHDLCHOL 108 04/04/2020 05:40 AM    CHOLHDLC 2.4 08/12/2018 12:00 AM   > Continue atorvastatin 40 mg daily    ?  PAD  Venous insufficieny   - 10/7 ABI ?+ doppler BLE shows normal ABI to BLE  - 10/7 RLE arterial duplex shows 50-75% stenosis to mid R SFA with elevated velocity, turbulence and ratio  - 10/7 LLE arterial duplex shows 50-75% stenosis to L EIA, 50-75% stenosis to distal L CFA and proximal L SFA w/ elevated velocity and ratio along w/ monophasic waveform; focal stenosis unable to be ruled out  - 11/28/19 PV venous insufficiency BLE study shows no residual reflux   - pt established with Dr Elizabeth Palau; plan  for conservative treatment with Asa and statin   > refer back to venous vasuclar team and send to get fitted for graduated compression stockings  > visit w/ Dr. Elizabeth Palau on 12/22/20  ?  Chronic bronchitis/COPD?/bronchiectasis  Asthma exacerbation   Prior COVID-19 infection with hospitalization  Oxygen dependent   - Pt follows with Dr. Lambert Mody from Pulmonary team  - Wears 4 L O2 at baseline  > Next appointment with Dr. Lambert Mody 03/18/21  ?  Morbid obesity:  - Body mass index is 40.87 kg/m?.  > Patient will continue with diet modification in an effort to lose weight  ?  OSA:> CPAP nightly    Follow-up: 9/7 with Dr. Betti Cruz and [facility] in preparation for watchman device placement on 9/19.  He has an appointment with Dr. Harley Alto on 12/22/2020.    He has been encouraged to contact us prior to the next appointment with any questions, concerns, or worsening symptoms.    Thank you for allowing me to participate in the care of this patient. If you have any questions please do not hesitate to contact our office.      Bunnie Philips, ANP-BC, MSN, [facility] - CVM Advance Practice Provider  Heart Failure APP Coordinator  [facility] [place] [facility]   Phone [phone]  - Fax [phone] - [email or web address]  [address], [address], [place], [place] [address]  Collaborating Physician Dr. Harlow Mares Total Time Today was 55 minutes in the following activities: Preparing to see the patient, Obtaining and/or reviewing separately obtained history, Performing a medically appropriate examination and/or evaluation, Counseling and educating the patient/family/caregiver, Ordering medications, tests, or procedures, Referring and communication with other health care professionals (when not separately reported), Documenting clinical information in the electronic or other health record and Independently interpreting results (not separately reported) and communicating results to the patient/family/caregiver    Current Medications (including today's revisions)  ? acetaminophen (TYLENOL) 325 mg tablet Take two tablets by mouth every 6 hours as needed for Pain.   ? albuterol 0.083% (PROVENTIL) 2.5 mg /3 mL (0.083 %) nebulizer solution Inhale 3 mL solution by nebulizer as directed every 6 hours as needed for Wheezing or Shortness of Breath. Indications: asthma   ? albuterol sulfate (PROAIR HFA) 90 mcg/actuation HFA aerosol inhaler Inhale two puffs by mouth into the lungs every 4 hours as needed for Wheezing or Shortness of Breath.   ? allopurinoL (ZYLOPRIM) 300 mg tablet Take one tablet by mouth daily. Take with food.   ? AMITR/GABAPEN/EMU OIL 05/24/08% CREAM (COMPOUND) Apply topically to affected area three times daily as needed.   ? arformoteroL (BROVANA) 15 mcg/2 mL nebulizer solution Inhale 2 mL solution by nebulizer as directed twice daily. Dx: J44.9   ? ASCORBIC ACID-ASCORBATE SODIUM PO Take 500 mg by mouth every morning.   ? atorvastatin (LIPITOR) 40 mg tablet Take one tablet by mouth daily.   ? azelastine (ASTELIN) 137 mcg (0.1 %) nasal spray Apply two sprays to each nostril as directed twice daily. Use in each nostril as directed   ? benzonatate (TESSALON PERLES) 100 mg capsule Take one capsule by mouth every 8 hours.   ? budesonide (PULMICORT) 0.25 mg/2 mL nebulizer solution Inhale 2 mL solution by nebulizer as directed twice daily. Dx: J44.9   ? bumetanide (BUMEX) 1 mg tablet 09/20/2020 2 Decrease dose to take 3mg  in the morning only.   ? cholecalciferol (VITAMIN D-3) 1,000 units tablet Take 1,000 Units by mouth daily.   ? diclofenac  sodium (VOLTAREN) 1 % topical gel Apply four g topically to affected area four times daily.   ? duloxetine DR (CYMBALTA) 60 mg capsule Take one capsule by mouth daily.   ? empagliflozin (JARDIANCE) 25 mg tablet Take one tablet by mouth daily.   ? finasteride (PROSCAR) 5 mg tablet Take one tablet by mouth daily.   ? fish oil- omega 3-DHA/EPA 300/1,000 mg capsule Take 1 capsule by mouth daily.   ? flash glucose scanning reader (FREESTYLE LIBRE) reader Use as directed.   ? flash glucose sensor (FREESTYLE LIBRE 14 DAY SENSOR) sensor Type 2 diabetes  Indications: type 2 diabetes mellitus   ? gabapentin (NEURONTIN) 100 mg capsule Take four capsules by mouth twice daily.   ? insulin pen needles (disposable) (BD ULTRA-FINE MINI PEN NEEDLE) 31 gauge x 3/16 pen needle Use with insulin pens   ? ipratropium bromide (ATROVENT) 21 mcg (0.03 %) nasal spray Apply two sprays to each nostril as directed every 12 hours.   ? metOLazone (ZAROXOLYN) 2.5 mg tablet Take one tablet by mouth daily as needed. PER OUTPATIENT CARDIOLOGY PROVIDER AS NEEDED FOR FLUID RETENTION   ? Miscellaneous Medical Supply misc Vancomycin 1,500 mg IV every 24hours starting 06/25/20 x 4 days (14 total antibiotic days), please draw vancomycin level per your protocol at [facility].   ? Miscellaneous Medical Supply misc Rx: Please wrap legs with ACE bandage from toes as high up to the leg as possible bilaterally  Dx: Lymphedema   ? montelukast (SINGULAIR) 10 mg tablet Take one tablet by mouth at bedtime daily.   ? nitroglycerin (NITROSTAT) 0.4 mg tablet Place 0.4 mg under tongue every 5 minutes as needed for Chest Pain. Max of 3 tablets, call 911.   ? nystatin (MYCOSTATIN) 100,000 unit/g topical cream Apply  topically to affected area twice daily.   ? oxyCODONE (ROXICODONE) 5 mg tablet Take one tablet to two tablets by mouth every 8 hours as needed for Pain   ? pantoprazole DR (PROTONIX) 40 mg tablet Take one tablet by mouth twice daily.   ? polyethylene glycol 3350 (MIRALAX) 17 g packet Take two packets by mouth twice daily.   ? predniSONE (DELTASONE) 20 mg tablet Take two tablets by mouth daily.   ? rivaroxaban (XARELTO) 20 mg tablet Take one tablet by mouth daily with dinner. Take with food.   ? semaglutide (OZEMPIC) 0.25 mg or 0.5 mg(2 mg/1.5 mL) injection PEN Inject one-half mg under the skin every 7 days.   ? senna/docusate (SENOKOT-S) 8.6/50 mg tablet Take one tablet by mouth twice daily.   ? sodium chloride (HYPER-SAL) 3.5 % inhalation solution Inhale 4 mL by mouth into the lungs twice daily. 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 spironolactone (ALDACTONE) 25 mg tablet Take one tablet by mouth twice daily. Take with food.   ? tamsulosin (FLOMAX) 0.4 mg capsule Take one capsule by mouth daily. Do not crush, chew or open capsules. Take 30 minutes following the same meal each day.   ? traMADoL (ULTRAM) 50 mg tablet Take one tablet by mouth every 8 hours as needed for Pain.   ? zinc sulfate 220 mg (50 mg elemental zinc) capsule Take 220 mg by mouth daily.

## 2020-09-30 NOTE — Progress Notes
Daily CardioMEMS Readings    Goal PA Diastolic: 15 mmHg   PA Diastolic Thresholds: 12-17 mmHg  Notified Jenny Pearson, APRN

## 2020-10-04 ENCOUNTER — Encounter: Admit: 2020-10-04 | Discharge: 2020-10-04 | Payer: MEDICARE

## 2020-10-04 MED ORDER — GABAPENTIN 100 MG PO CAP
ORAL_CAPSULE | Freq: Two times a day (BID) | 1 refills | Status: AC
Start: 2020-10-04 — End: ?

## 2020-10-04 NOTE — Telephone Encounter
Some refill protocol elements NOT Met  Medication name: gabapentin  Medication Strength: 100 mg    Last Fill Date: 09/15/20 90 (10 day supply)    Non-delegated medication    Routed to Provider

## 2020-10-05 ENCOUNTER — Encounter: Admit: 2020-10-05 | Discharge: 2020-10-05 | Payer: MEDICARE

## 2020-10-05 DIAGNOSIS — R399 Unspecified symptoms and signs involving the genitourinary system: Secondary | ICD-10-CM

## 2020-10-05 MED ORDER — TAMSULOSIN 0.4 MG PO CAP
.4 mg | ORAL_CAPSULE | Freq: Every day | ORAL | 3 refills | 90.00000 days | Status: AC
Start: 2020-10-05 — End: ?

## 2020-10-05 MED ORDER — FINASTERIDE 5 MG PO TAB
5 mg | ORAL_TABLET | Freq: Every day | ORAL | 3 refills | Status: AC
Start: 2020-10-05 — End: ?

## 2020-10-05 NOTE — Progress Notes
Medicare Primary No pre-certification is required.

## 2020-10-05 NOTE — Telephone Encounter
Last OV 06/01/20 "continue Finasteride & Tamsulosin" Refill approved. Next OV 06/02/21.

## 2020-10-06 ENCOUNTER — Encounter: Admit: 2020-10-06 | Discharge: 2020-10-06 | Payer: MEDICARE

## 2020-10-06 DIAGNOSIS — J4550 Severe persistent asthma, uncomplicated: Secondary | ICD-10-CM

## 2020-10-06 MED ORDER — GABAPENTIN 100 MG PO CAP
ORAL_CAPSULE | Freq: Two times a day (BID) | 0 refills | Status: AC
Start: 2020-10-06 — End: ?

## 2020-10-06 MED ORDER — MONTELUKAST 10 MG PO TAB
10 mg | ORAL_TABLET | Freq: Every evening | ORAL | 2 refills | 90.00000 days | Status: AC
Start: 2020-10-06 — End: ?

## 2020-10-06 NOTE — Telephone Encounter
Some refill protocol elements NOT Met  Medication name: gabapentin  Medication Strength: 100 mg    Last Fill Date: 10/04/20    Non-delegated medication    Routed to Provider

## 2020-10-06 NOTE — Telephone Encounter
pts spouse called reporting pt will need to have a tooth pulled and says dental office will need recommendations regarding his blood thinners.

## 2020-10-06 NOTE — Telephone Encounter
Montelukast in POC to continue and part of refill protocol. Refilled.

## 2020-10-06 NOTE — Telephone Encounter
Called patient and able to speak to both patient and spouse, Ruben Wood.    Ruben Wood reports patient is to have 1 tooth extracted by Dr. Leland Her at [facility] in [place], [place], [place]) [phone]. Date of extraction currently not scheduled, but plan for after August 31. Per spouse, dentist requested for recommendations for blood thinner hold prior to tooth extraction.     Patient currently prescribed Xarelto for paroxysmal a-fib.    Informed to patient and spouse we will inform Dr. Sherryll Burger and keep them and dentist office updated with provider recommendations.

## 2020-10-08 ENCOUNTER — Encounter: Admit: 2020-10-08 | Discharge: 2020-10-08 | Payer: MEDICARE

## 2020-10-11 ENCOUNTER — Encounter: Admit: 2020-10-11 | Discharge: 2020-10-11 | Payer: MEDICARE

## 2020-10-13 ENCOUNTER — Encounter: Admit: 2020-10-13 | Discharge: 2020-10-13 | Payer: MEDICARE

## 2020-10-14 ENCOUNTER — Encounter: Admit: 2020-10-14 | Discharge: 2020-10-14 | Payer: MEDICARE

## 2020-10-14 MED FILL — SODIUM CHLORIDE 3.5 % IN NEBU: 3.5 % | RESPIRATORY_TRACT | 30 days supply | Qty: 240 | Fill #3 | Status: AC

## 2020-10-14 MED FILL — ALBUTEROL SULFATE 2.5 MG /3 ML (0.083 %) IN NEBU: 2.5 mg /3 mL (0.083 %) | RESPIRATORY_TRACT | 19 days supply | Qty: 225 | Fill #4 | Status: AC

## 2020-10-14 MED FILL — BUDESONIDE 0.25 MG/2 ML IN NBSP: 0.25 mg/2 mL | RESPIRATORY_TRACT | 30 days supply | Qty: 120 | Fill #5 | Status: AC

## 2020-10-15 ENCOUNTER — Encounter: Admit: 2020-10-15 | Discharge: 2020-10-15 | Payer: MEDICARE

## 2020-10-15 MED ORDER — BUMETANIDE 1 MG PO TAB
ORAL_TABLET | Freq: Two times a day (BID) | 3 refills
Start: 2020-10-15 — End: ?

## 2020-10-18 ENCOUNTER — Ambulatory Visit: Admit: 2020-10-18 | Discharge: 2020-10-18 | Payer: MEDICARE

## 2020-10-18 ENCOUNTER — Encounter: Admit: 2020-10-18 | Discharge: 2020-10-18 | Payer: MEDICARE

## 2020-10-18 DIAGNOSIS — I5043 Acute on chronic combined systolic (congestive) and diastolic (congestive) heart failure: Secondary | ICD-10-CM

## 2020-10-19 ENCOUNTER — Encounter: Admit: 2020-10-19 | Discharge: 2020-10-19 | Payer: MEDICARE

## 2020-10-19 MED FILL — ARFORMOTEROL 15 MCG/2 ML IN NEBU: 15 mcg/2 mL | RESPIRATORY_TRACT | 30 days supply | Qty: 120 | Fill #5 | Status: AC

## 2020-10-20 ENCOUNTER — Encounter: Admit: 2020-10-20 | Discharge: 2020-10-20 | Payer: MEDICARE

## 2020-10-20 ENCOUNTER — Ambulatory Visit: Admit: 2020-10-20 | Discharge: 2020-10-20 | Payer: MEDICARE

## 2020-10-20 DIAGNOSIS — R110 Nausea: Secondary | ICD-10-CM

## 2020-10-20 MED ORDER — RP DX TC-99M SULF COLL MCI
1 | Freq: Once | ORAL | 0 refills | Status: CP
Start: 2020-10-20 — End: ?
  Administered 2020-10-20: 15:00:00 1.1 via ORAL

## 2020-10-21 ENCOUNTER — Encounter: Admit: 2020-10-21 | Discharge: 2020-10-21 | Payer: MEDICARE

## 2020-10-22 ENCOUNTER — Encounter: Admit: 2020-10-22 | Discharge: 2020-10-22 | Payer: MEDICARE

## 2020-10-22 NOTE — Telephone Encounter
Discussed case with Dr.Sharpe in person.    Verbal order rec'd for levaquin 750mg  daily x 7 days. Prednisone 40x 5 days with 1 refill.

## 2020-10-27 ENCOUNTER — Encounter: Admit: 2020-10-27 | Discharge: 2020-10-27 | Payer: MEDICARE

## 2020-10-27 ENCOUNTER — Ambulatory Visit: Admit: 2020-10-27 | Discharge: 2020-10-27 | Payer: MEDICARE

## 2020-10-27 DIAGNOSIS — L03116 Cellulitis of left lower limb: Secondary | ICD-10-CM

## 2020-10-27 DIAGNOSIS — R0600 Dyspnea, unspecified: Secondary | ICD-10-CM

## 2020-10-27 DIAGNOSIS — Z01818 Encounter for other preprocedural examination: Secondary | ICD-10-CM

## 2020-10-27 DIAGNOSIS — R053 Chronic cough: Secondary | ICD-10-CM

## 2020-10-27 DIAGNOSIS — J449 Chronic obstructive pulmonary disease, unspecified: Secondary | ICD-10-CM

## 2020-10-27 DIAGNOSIS — I10 Essential (primary) hypertension: Secondary | ICD-10-CM

## 2020-10-27 DIAGNOSIS — Z9981 Dependence on supplemental oxygen: Secondary | ICD-10-CM

## 2020-10-27 DIAGNOSIS — I429 Cardiomyopathy, unspecified: Secondary | ICD-10-CM

## 2020-10-27 DIAGNOSIS — G4733 Obstructive sleep apnea (adult) (pediatric): Secondary | ICD-10-CM

## 2020-10-27 DIAGNOSIS — M954 Acquired deformity of chest and rib: Secondary | ICD-10-CM

## 2020-10-27 DIAGNOSIS — R609 Edema, unspecified: Secondary | ICD-10-CM

## 2020-10-27 DIAGNOSIS — U071 Pneumonia due to COVID-19 virus: Secondary | ICD-10-CM

## 2020-10-27 DIAGNOSIS — I2510 Atherosclerotic heart disease of native coronary artery without angina pectoris: Secondary | ICD-10-CM

## 2020-10-27 DIAGNOSIS — D509 Iron deficiency anemia, unspecified: Secondary | ICD-10-CM

## 2020-10-27 DIAGNOSIS — M961 Postlaminectomy syndrome, not elsewhere classified: Secondary | ICD-10-CM

## 2020-10-27 DIAGNOSIS — N401 Enlarged prostate with lower urinary tract symptoms: Secondary | ICD-10-CM

## 2020-10-27 DIAGNOSIS — I480 Paroxysmal atrial fibrillation: Secondary | ICD-10-CM

## 2020-10-27 DIAGNOSIS — Z136 Encounter for screening for cardiovascular disorders: Secondary | ICD-10-CM

## 2020-10-27 DIAGNOSIS — E782 Mixed hyperlipidemia: Secondary | ICD-10-CM

## 2020-10-27 DIAGNOSIS — N183 CKD (chronic kidney disease) stage 3, GFR 30-59 ml/min (HCC): Secondary | ICD-10-CM

## 2020-10-27 DIAGNOSIS — J189 Pneumonia, unspecified organism: Secondary | ICD-10-CM

## 2020-10-27 DIAGNOSIS — J45909 Unspecified asthma, uncomplicated: Secondary | ICD-10-CM

## 2020-10-27 DIAGNOSIS — E119 Type 2 diabetes mellitus without complications: Secondary | ICD-10-CM

## 2020-10-27 DIAGNOSIS — Z95828 Presence of other vascular implants and grafts: Secondary | ICD-10-CM

## 2020-10-27 DIAGNOSIS — I714 Abdominal aortic aneurysm, without rupture: Secondary | ICD-10-CM

## 2020-10-27 DIAGNOSIS — I5042 Chronic combined systolic (congestive) and diastolic (congestive) heart failure: Secondary | ICD-10-CM

## 2020-10-27 DIAGNOSIS — J479 Bronchiectasis, uncomplicated: Secondary | ICD-10-CM

## 2020-10-27 DIAGNOSIS — K219 Gastro-esophageal reflux disease without esophagitis: Secondary | ICD-10-CM

## 2020-10-27 DIAGNOSIS — Z8614 Personal history of Methicillin resistant Staphylococcus aureus infection: Secondary | ICD-10-CM

## 2020-10-27 DIAGNOSIS — I4891 Unspecified atrial fibrillation: Secondary | ICD-10-CM

## 2020-10-27 DIAGNOSIS — Z8679 Personal history of other diseases of the circulatory system: Secondary | ICD-10-CM

## 2020-10-27 DIAGNOSIS — I509 Heart failure, unspecified: Secondary | ICD-10-CM

## 2020-10-27 DIAGNOSIS — J302 Other seasonal allergic rhinitis: Secondary | ICD-10-CM

## 2020-10-27 LAB — MAGNESIUM: MAGNESIUM: 1.4 mg/dL — ABNORMAL LOW (ref 1.6–2.6)

## 2020-10-27 LAB — BASIC METABOLIC PANEL
ANION GAP: 12 pg — ABNORMAL HIGH (ref 3–12)
BLD UREA NITROGEN: 33 mg/dL — ABNORMAL HIGH (ref 7–25)
CO2: 29 MMOL/L — ABNORMAL HIGH (ref 21–30)
CREATININE: 1.3 mg/dL — ABNORMAL HIGH (ref 0.4–1.24)
EGFR: 55 mL/min — ABNORMAL LOW (ref >60)
POTASSIUM: 4.1 MMOL/L — ABNORMAL LOW (ref 3.5–5.1)

## 2020-10-27 LAB — CBC
HEMATOCRIT: 36 % — ABNORMAL LOW (ref 40–50)
MCHC: 32 g/dL — ABNORMAL HIGH (ref 32.0–36.0)
MPV: 7.7 FL (ref 7–11)
RBC COUNT: 3.4 M/UL — ABNORMAL LOW (ref 4.4–5.5)
WBC COUNT: 5.7 K/UL (ref 4.5–11.0)

## 2020-10-28 ENCOUNTER — Encounter: Admit: 2020-10-28 | Discharge: 2020-10-28 | Payer: MEDICARE

## 2020-10-28 MED ORDER — MAGNESIUM OXIDE 400 MG MAGNESIUM PO CAP
400 mg | ORAL_CAPSULE | Freq: Two times a day (BID) | ORAL | 11 refills | Status: AC
Start: 2020-10-28 — End: ?

## 2020-10-28 NOTE — Telephone Encounter
-----   Message from Tona Sensing, APRN-NP sent at 10/28/2020  9:21 AM CDT -----  Let's start mag ox 400 mg po bid and recheck on admit    ----- Message -----  From: Janett Labella, RN  Sent: 10/28/2020   8:28 AM CDT  To: Tona Sensing, APRN-NP    Please review Mag level. Drawn for Pre-op labs prior to [facility] implant. Doesn't look like he's on a supplement. Recs?    Iron panel sent to PCP, Dr. Crecencio Mc for review.

## 2020-10-28 NOTE — Telephone Encounter
MyChart message sent to the patient.

## 2020-11-01 ENCOUNTER — Encounter: Admit: 2020-11-01 | Discharge: 2020-11-01 | Payer: MEDICARE

## 2020-11-01 NOTE — Telephone Encounter
Dr. Cherlynn Polo and Amy,   Thank you for reaching out. Mr. Bullinger does struggle at times with pulmonary exacerbations and CHF exacerbations. At times it is difficult to differentiate. He also struggles with rather significant sinus drainage which can worsen his lung disease. He has been struggling as of late and we are attempting to manage his issues on an outpatient basis. I will have my nurse reach out and check on him. We can reassess next week and see how he is doing. I believe, when controlled, he will tolerate procedure fine. May need more time to get him back in control. I will keep you up to date.   Thanks again,

## 2020-11-03 ENCOUNTER — Encounter: Admit: 2020-11-03 | Discharge: 2020-11-03 | Payer: MEDICARE

## 2020-11-03 ENCOUNTER — Ambulatory Visit: Admit: 2020-11-03 | Discharge: 2020-11-03 | Payer: MEDICARE

## 2020-11-03 DIAGNOSIS — R053 Chronic cough: Secondary | ICD-10-CM

## 2020-11-03 DIAGNOSIS — J302 Other seasonal allergic rhinitis: Secondary | ICD-10-CM

## 2020-11-03 DIAGNOSIS — J45909 Unspecified asthma, uncomplicated: Secondary | ICD-10-CM

## 2020-11-03 DIAGNOSIS — Z9981 Dependence on supplemental oxygen: Secondary | ICD-10-CM

## 2020-11-03 DIAGNOSIS — J189 Pneumonia, unspecified organism: Secondary | ICD-10-CM

## 2020-11-03 DIAGNOSIS — N183 CKD (chronic kidney disease) stage 3, GFR 30-59 ml/min (HCC): Secondary | ICD-10-CM

## 2020-11-03 DIAGNOSIS — Z8614 Personal history of Methicillin resistant Staphylococcus aureus infection: Secondary | ICD-10-CM

## 2020-11-03 DIAGNOSIS — U071 Pneumonia due to COVID-19 virus: Secondary | ICD-10-CM

## 2020-11-03 DIAGNOSIS — R609 Edema, unspecified: Secondary | ICD-10-CM

## 2020-11-03 DIAGNOSIS — J470 Bronchiectasis with acute lower respiratory infection: Secondary | ICD-10-CM

## 2020-11-03 DIAGNOSIS — G4733 Obstructive sleep apnea (adult) (pediatric): Secondary | ICD-10-CM

## 2020-11-03 DIAGNOSIS — M954 Acquired deformity of chest and rib: Secondary | ICD-10-CM

## 2020-11-03 DIAGNOSIS — J449 Chronic obstructive pulmonary disease, unspecified: Secondary | ICD-10-CM

## 2020-11-03 DIAGNOSIS — J4541 Moderate persistent asthma with (acute) exacerbation: Secondary | ICD-10-CM

## 2020-11-03 DIAGNOSIS — R0600 Dyspnea, unspecified: Secondary | ICD-10-CM

## 2020-11-03 DIAGNOSIS — E782 Mixed hyperlipidemia: Secondary | ICD-10-CM

## 2020-11-03 DIAGNOSIS — I509 Heart failure, unspecified: Secondary | ICD-10-CM

## 2020-11-03 DIAGNOSIS — J479 Bronchiectasis, uncomplicated: Secondary | ICD-10-CM

## 2020-11-03 DIAGNOSIS — I714 Abdominal aortic aneurysm, without rupture: Secondary | ICD-10-CM

## 2020-11-03 DIAGNOSIS — L03116 Cellulitis of left lower limb: Secondary | ICD-10-CM

## 2020-11-03 DIAGNOSIS — I480 Paroxysmal atrial fibrillation: Secondary | ICD-10-CM

## 2020-11-03 DIAGNOSIS — I5042 Chronic combined systolic (congestive) and diastolic (congestive) heart failure: Secondary | ICD-10-CM

## 2020-11-03 DIAGNOSIS — E119 Type 2 diabetes mellitus without complications: Secondary | ICD-10-CM

## 2020-11-03 DIAGNOSIS — Z95828 Presence of other vascular implants and grafts: Secondary | ICD-10-CM

## 2020-11-03 DIAGNOSIS — N401 Enlarged prostate with lower urinary tract symptoms: Secondary | ICD-10-CM

## 2020-11-03 DIAGNOSIS — D801 Nonfamilial hypogammaglobulinemia: Secondary | ICD-10-CM

## 2020-11-03 DIAGNOSIS — I429 Cardiomyopathy, unspecified: Secondary | ICD-10-CM

## 2020-11-03 DIAGNOSIS — M961 Postlaminectomy syndrome, not elsewhere classified: Secondary | ICD-10-CM

## 2020-11-03 DIAGNOSIS — I10 Essential (primary) hypertension: Secondary | ICD-10-CM

## 2020-11-03 DIAGNOSIS — I2510 Atherosclerotic heart disease of native coronary artery without angina pectoris: Secondary | ICD-10-CM

## 2020-11-03 DIAGNOSIS — I4891 Unspecified atrial fibrillation: Secondary | ICD-10-CM

## 2020-11-03 DIAGNOSIS — Z8679 Personal history of other diseases of the circulatory system: Secondary | ICD-10-CM

## 2020-11-03 DIAGNOSIS — D509 Iron deficiency anemia, unspecified: Secondary | ICD-10-CM

## 2020-11-03 DIAGNOSIS — K219 Gastro-esophageal reflux disease without esophagitis: Secondary | ICD-10-CM

## 2020-11-03 NOTE — Patient Instructions
Future Appointments   Date Time Provider Department Center   11/08/2020  8:00 AM Tanaina TEE CATH CLE [facility] CVM Procedur   11/18/2020  4:00 PM MAC REMOTE MONITORING [facility]HRM CVM Procedur   12/06/2020  4:00 PM Natilee Gauer, [name], MD [facility] IM   12/20/2020  4:00 PM MAC REMOTE MONITORING [facility]HRM CVM Procedur   12/22/2020 10:45 AM CT-HOSPITAL ROOM 1 (FLASH) CAT Radiology   12/22/2020 11:15 AM Harley Alto, MD MACKUCL CVM Exam   01/20/2021  4:00 PM MAC REMOTE MONITORING MACREMOTEHRM CVM Procedur   01/27/2021 10:30 AM Vanetta Shawl I, MD CVMSNCL CVM Exam   02/21/2021  4:00 PM MAC REMOTE MONITORING MACREMOTEHRM CVM Procedur   03/18/2021 11:00 AM Deveron Furlong, MD QVAPULM IM   03/24/2021  4:00 PM MAC REMOTE MONITORING MACREMOTEHRM CVM Procedur   04/25/2021  4:00 PM MAC REMOTE MONITORING MACREMOTEHRM CVM Procedur   05/26/2021  4:00 PM MAC REMOTE MONITORING MACREMOTEHRM CVM Procedur   06/02/2021  1:00 PM [name], Lenice Llamas, PA-C [facility] Urology   06/27/2021  4:00 PM MAC REMOTE MONITORING MACREMOTEHRM CVM Procedur       If you are on Mychart, you will now be able to read your clinic note from me.  My hope is that this will improve your engagement and insight into your health care and improve the accuracy of your medical record.  If you find inaccuracies, I apologize.  Please bring any concerns or inaccuracies to your next appointment.  Please remember that medical language and documentation is very different from how you and I speak and our dictation software is not perfect.    General Instructions:  How to reach me:   Please send a MyChart message to the General Medicine clinic or call Wynona Canes at [phone].    How to get a medication refill:  Please use the MyChart Refill request or contact your pharmacy directly to request medication refills. Please allow 48 hours.     How to receive your test results:  If you have signed up for MyChart, you will receive your test results and messages from me this way.  Otherwise, you will get a phone call or letter.   If you are expecting results and have not heard from my office within 2 weeks of your testing, please send a MyChart message or call my office.     Scheduling:  Our Scheduling phone number is [phone].  Same Day appointments are usually available. Please ask for an annual or yearly physical appointment if it has been over 1 year since our last appointment.  Support groups for many chronic illnesses are available through [facility]: [email or web address] or [phone].

## 2020-11-03 NOTE — Progress Notes
History of Present Illness  Ruben Wood. is a 80 y.o. male     [name] is here today mostly to talk about the watchman procedure that he has scheduled for next week.  He is quite hesitant to undergo this procedure.  He does have atrial fibrillation.  He is on anticoagulation with Xarelto.  He has been tolerating this fairly well overall with exception of having some bruising.  He is not had any significant bleeding issues.  He is following with Dr. Betti Cruz for his atrial fibrillation.  He has not had any palpitations or heart fluttering.  He has not had any falls.    He is really been struggling, however, with his lungs.  He has known bronchiectasis as well as asthma.  He is had frequent flares of this which are often triggered by allergies and viral infections.  He has been following with Dr. Lambert Mody more recently for this and further review of records looks like he has had 3 rounds of prednisone and 3 rounds of levofloxacin over the past couple of months.  He is having some mild shortness of breath today with mild wheezing, but overall his symptoms are better than what they have been.  He denies any chest pain or pressure heaviness.       Review of Systems  Per HPI    Objective:         ? acetaminophen (TYLENOL) 325 mg tablet Take two tablets by mouth every 6 hours as needed for Pain.   ? albuterol 0.083% (PROVENTIL) 2.5 mg /3 mL (0.083 %) nebulizer solution Inhale 3 mL solution by nebulizer as directed every 6 hours as needed for Wheezing or Shortness of Breath. Indications: asthma   ? albuterol sulfate (PROAIR HFA) 90 mcg/actuation HFA aerosol inhaler Inhale two puffs by mouth into the lungs every 4 hours as needed for Wheezing or Shortness of Breath. (Patient not taking: Reported on 10/27/2020)   ? allopurinoL (ZYLOPRIM) 300 mg tablet Take one tablet by mouth daily. Take with food.   ? AMITR/GABAPEN/EMU OIL 05/24/08% CREAM (COMPOUND) Apply topically to affected area three times daily as needed.   ? arformoteroL (BROVANA) 15 mcg/2 mL nebulizer solution Inhale 2 mL solution by nebulizer as directed twice daily. Dx: J44.9   ? ASCORBIC ACID-ASCORBATE SODIUM PO Take 500 mg by mouth every morning.   ? atorvastatin (LIPITOR) 40 mg tablet Take one tablet by mouth daily.   ? azelastine (ASTELIN) 137 mcg (0.1 %) nasal spray Apply two sprays to each nostril as directed twice daily. Use in each nostril as directed   ? benzonatate (TESSALON PERLES) 100 mg capsule Take one capsule by mouth every 8 hours.   ? budesonide (PULMICORT) 0.25 mg/2 mL nebulizer solution Inhale 2 mL solution by nebulizer as directed twice daily. Dx: J44.9   ? bumetanide (BUMEX) 1 mg tablet TAKE 2 TABLETS BY MOUTH TWICE DAILY   ? cholecalciferol (VITAMIN D-3) 1,000 units tablet Take 1,000 Units by mouth daily.   ? duloxetine DR (CYMBALTA) 60 mg capsule Take one capsule by mouth daily.   ? empagliflozin (JARDIANCE) 25 mg tablet Take one tablet by mouth daily.   ? finasteride (PROSCAR) 5 mg tablet Take one tablet by mouth daily.   ? fish oil- omega 3-DHA/EPA 300/1,000 mg capsule Take 1 capsule by mouth daily.   ? flash glucose scanning reader (FREESTYLE LIBRE) reader Use as directed.   ? flash glucose sensor (FREESTYLE LIBRE 14 DAY SENSOR) sensor Type 2 diabetes  Indications: type 2 diabetes mellitus   ? gabapentin (NEURONTIN) 100 mg capsule TAKE 4 CAPSULES BY MOUTH TWICE DAILY   ? insulin pen needles (disposable) (BD ULTRA-FINE MINI PEN NEEDLE) 31 gauge x 3/16 pen needle Use with insulin pens   ? ipratropium bromide (ATROVENT) 21 mcg (0.03 %) nasal spray Apply two sprays to each nostril as directed every 12 hours. (Patient not taking: Reported on 10/27/2020)   ? levoFLOXacin (LEVAQUIN) 750 mg tablet Take one tablet by mouth daily.   ? magnesium oxide 400 mg magnesium capsule Take one capsule by mouth twice daily.   ? metOLazone (ZAROXOLYN) 2.5 mg tablet Take one tablet by mouth daily as needed. PER OUTPATIENT CARDIOLOGY PROVIDER AS NEEDED FOR FLUID RETENTION   ? Miscellaneous Medical Supply misc itted for LE gradual compression stockings at a medical supply store   ? Miscellaneous Medical Supply misc Vancomycin 1,500 mg IV every 24hours starting 06/25/20 x 4 days (14 total antibiotic days), please draw vancomycin level per your protocol at [facility].   ? Miscellaneous Medical Supply misc Rx: Please wrap legs with ACE bandage from toes as high up to the leg as possible bilaterally  Dx: Lymphedema   ? montelukast (SINGULAIR) 10 mg tablet Take one tablet by mouth at bedtime daily.   ? nitroglycerin (NITROSTAT) 0.4 mg tablet Place 0.4 mg under tongue every 5 minutes as needed for Chest Pain. Max of 3 tablets, call 911.   ? nystatin (MYCOSTATIN) 100,000 unit/g topical cream Apply  topically to affected area twice daily.   ? pantoprazole DR (PROTONIX) 40 mg tablet Take one tablet by mouth twice daily.   ? polyethylene glycol 3350 (MIRALAX) 17 g packet Take two packets by mouth twice daily.   ? predniSONE (DELTASONE) 20 mg tablet Take one tablet by mouth daily.   ? rivaroxaban (XARELTO) 20 mg tablet Take one tablet by mouth daily with dinner. Take with food.   ? semaglutide (OZEMPIC) 0.25 mg or 0.5 mg(2 mg/1.5 mL) injection PEN Inject one-half mg under the skin every 7 days.   ? sodium chloride (HYPER-SAL) 3.5 % inhalation solution Inhale 4 mL by mouth into the lungs twice daily. 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 spironolactone (ALDACTONE) 25 mg tablet Take one tablet by mouth twice daily. Take with food.   ? tamsulosin (FLOMAX) 0.4 mg capsule Take one capsule by mouth daily. Do not crush, chew or open capsules. Take 30 minutes following the same meal each day.   ? traMADoL (ULTRAM) 50 mg tablet Take one tablet by mouth every 8 hours as needed for Pain.   ? zinc sulfate 220 mg (50 mg elemental zinc) capsule Take 220 mg by mouth daily.     Vitals:    11/03/20 0823   BP: 127/60   BP Source: Arm, Left Upper   Pulse: 79   Temp: 36.5 ?C (97.7 ?F)   Resp: 18   SpO2: 97%  Comment: 2 L   TempSrc: Oral   PainSc: Eight   Height: 180.3 cm (5' 11)     Body mass index is 39.05 kg/m?[name]     Physical Exam  Vitals and nursing note reviewed.   Constitutional:       General: He is not in acute distress.     Appearance: He is well-developed. He is not diaphoretic.   HENT:      Head: Normocephalic and atraumatic.   Neck:      Thyroid: No thyromegaly.  Vascular: No JVD.   Cardiovascular:      Rate and Rhythm: Normal rate and regular rhythm.      Heart sounds: Normal heart sounds. No murmur heard.    No friction rub. No gallop.   Pulmonary:      Effort: Pulmonary effort is normal. No respiratory distress.      Breath sounds: Wheezing and rhonchi present. No rales.   Musculoskeletal:      Right lower leg: No edema.      Left lower leg: No edema.   Skin:     Comments: Hyperpigmentation and liposclerosis of lower extremities   Neurological:      Mental Status: He is alert.         Labwork reviewed:  Lab Results   Component Value Date/Time    HGBA1C 6.7 (H) 04/04/2020 05:40 AM    HGBA1C 5.9 11/25/2019 02:45 PM    HGBA1C 5.9 09/02/2019 09:50 AM    HGBPOC 14.3 04/08/2020 06:58 AM    HGBPOC 11.2 (L) 10/25/2012 10:58 AM    HGBPOC 12.9 (L) 10/25/2012 08:55 AM    MCALBR 7.2 05/18/2020 01:45 PM    TSH 2.49 03/05/2020 08:40 PM    FREET4R 0.99 12/15/2014 09:12 AM    CHOL 161 04/04/2020 05:40 AM    TRIG 152 (H) 04/04/2020 05:40 AM    HDL 53 04/04/2020 05:40 AM    LDL 87 04/04/2020 05:40 AM    NA 140 10/27/2020 04:51 PM    K 4.1 10/27/2020 04:51 PM    CL 99 10/27/2020 04:51 PM    CO2 29 10/27/2020 04:51 PM    GAP 12 10/27/2020 04:51 PM    BUN 33 (H) 10/27/2020 04:51 PM    CR 1.30 (H) 10/27/2020 04:51 PM    GLU 109 (H) 10/27/2020 04:51 PM    CA 9.1 10/27/2020 04:51 PM    PO4 4.1 03/08/2020 03:51 AM    ALBUMIN 3.8 06/15/2020 02:50 PM    TOTPROT 6.0 06/15/2020 02:50 PM    ALKPHOS 67 06/15/2020 02:50 PM    AST 13 06/15/2020 02:50 PM    ALT 9 06/15/2020 02:50 PM    TOTBILI 0.8 06/15/2020 02:50 PM    GFR 41 09/27/2020 12:00 AM    GFRAA >60 12/03/2019 04:51 AM    PSA 2.96 11/25/2018 12:00 AM              Assessment and Plan:    1. Paroxysmal atrial fibrillation (HCC)    2. Bronchiectasis with acute lower respiratory infection (HCC)    3. Hypogammaglobulinemia (HCC)    4. Moderate persistent asthma with acute exacerbation      He is here today to review his upcoming procedure with the watchman procedure.  I am hesitant as well with him undergoing this procedure.  He has very poor pulmonary function and has frequent flares of his bronchiectasis as well as his asthma.  Just over the last couple of months, he is required 3 rounds of prednisone and levofloxacin.  I am concerned about him undergoing general anesthesia at this time.  If we can get his lungs in a more stable state, then I think it would be reasonable to pursue this.  He is also been doing okay with the oral anticoagulation.  He is only having some bruising, but has not had any significant bleeding issues.  Overall, I think that the risk of the procedure probably outweigh the risk of continuing with oral anticoagulation.  We had a lengthy  conversation on this today.  I will coordinate with his cardiology team, Dr. Betti Cruz, as well as his pulmonary team, Dr. [name], to make sure that everyone is on the same page with this.    Total of 40 minutes were spent on the same day of the visit including preparing to see the patient, obtaining and/or reviewing separately obtained history, performing a medically appropriate examination and/or evaluation, counseling and educating the patient/family/caregiver, ordering medications, tests, or procedures, referring and communication with other health care professionals, documenting clinical information in the electronic or other health record, independently interpreting results and communicating results to the patient/family/caregiver, and care coordination.         Patient Instructions     Future Appointments   Date Time Provider Department Center   11/08/2020  8:00 AM Elroy TEE CATH CLE [facility] CVM Procedur   11/18/2020  4:00 PM MAC REMOTE MONITORING [facility]CREMOTEHRM CVM Procedur   12/06/2020  4:00 PM Khylah Kendra, [name], MD [facility]ENMED IM   12/20/2020  4:00 PM MAC REMOTE MONITORING MACREMOTEHRM CVM Procedur   12/22/2020 10:45 AM CT-HOSPITAL ROOM 1 (FLASH) CAT Radiology   12/22/2020 11:15 AM Harley Alto, MD [facility] CVM Exam   01/20/2021  4:00 PM MAC REMOTE MONITORING MACREMOTEHRM CVM Procedur   01/27/2021 10:30 AM Vanetta Shawl I, MD CVMSNCL CVM Exam   02/21/2021  4:00 PM MAC REMOTE MONITORING MACREMOTEHRM CVM Procedur   03/18/2021 11:00 AM Deveron Furlong, MD QVAPULM IM   03/24/2021  4:00 PM MAC REMOTE MONITORING MACREMOTEHRM CVM Procedur   04/25/2021  4:00 PM MAC REMOTE MONITORING MACREMOTEHRM CVM Procedur   05/26/2021  4:00 PM MAC REMOTE MONITORING MACREMOTEHRM CVM Procedur   06/02/2021  1:00 PM [name], Lenice Llamas, PA-C [facility] Urology   06/27/2021  4:00 PM MAC REMOTE MONITORING MACREMOTEHRM CVM Procedur       If you are on Mychart, you will now be able to read your clinic note from me.  My hope is that this will improve your engagement and insight into your health care and improve the accuracy of your medical record.  If you find inaccuracies, I apologize.  Please bring any concerns or inaccuracies to your next appointment.  Please remember that medical language and documentation is very different from how you and I speak and our dictation software is not perfect.    General Instructions:  ? How to reach me:   Please send a MyChart message to the General Medicine clinic or call Wynona Canes at [phone].    ? How to get a medication refill:  Please use the MyChart Refill request or contact your pharmacy directly to request medication refills. Please allow 48 hours.     ? How to receive your test results:  If you have signed up for MyChart, you will receive your test results and messages from me this way.  Otherwise, you will get a phone call or letter.   If you are expecting results and have not heard from my office within 2 weeks of your testing, please send a MyChart message or call my office.     ? Scheduling:  Our Scheduling phone number is [phone].  Same Day appointments are usually available. Please ask for an annual or yearly physical appointment if it has been over 1 year since our last appointment.  ? Support groups for many chronic illnesses are available through [facility]: [email or web address] or [phone].            No follow-ups on file.

## 2020-11-05 ENCOUNTER — Encounter: Admit: 2020-11-05 | Discharge: 2020-11-05 | Payer: MEDICARE

## 2020-11-05 DIAGNOSIS — E782 Mixed hyperlipidemia: Secondary | ICD-10-CM

## 2020-11-05 DIAGNOSIS — I2510 Atherosclerotic heart disease of native coronary artery without angina pectoris: Secondary | ICD-10-CM

## 2020-11-05 DIAGNOSIS — Z4502 Encounter for adjustment and management of automatic implantable cardiac defibrillator: Secondary | ICD-10-CM

## 2020-11-05 DIAGNOSIS — Z95810 Presence of automatic (implantable) cardiac defibrillator: Secondary | ICD-10-CM

## 2020-11-05 DIAGNOSIS — I10 Essential (primary) hypertension: Secondary | ICD-10-CM

## 2020-11-05 MED ORDER — LIDOCAINE (PF) 10 MG/ML (1 %) IJ SOLN
.2 mL | INTRAMUSCULAR | 0 refills | PRN
Start: 2020-11-05 — End: ?

## 2020-11-05 MED ORDER — SODIUM CHLORIDE 0.9 % IV SOLP
INTRAVENOUS | 0 refills
Start: 2020-11-05 — End: ?

## 2020-11-05 MED ORDER — CEFAZOLIN IVPB
3 g | Freq: Once | INTRAVENOUS | 0 refills
Start: 2020-11-05 — End: ?

## 2020-11-05 NOTE — Patient Instructions
ELECTROPHYSIOLOGY PRE-ADMISSION INSTRUCTIONS    Patient Name: Ruben Wood.  MRN#: [id number]  Date of Birth: 08-12-1940 (80 y.o.)  Today's Date: 11/05/2020    PROCEDURE:  You are scheduled for a Generator Change with Dr. Annamarie Dawley.      ARRIVAL TIME:  Please report to the [facility] admitting office on the ground floor of the [facility] on: Wednesday 10/5    The [facility] is located at [address] [place], [place] [address]. Park in [employer] and enter through the  Main front doors to the hospital. The Heart Center is located on the right-hand side as soon as you walk in.    The EP Lab will call to notify you of your arrival time.  They will call on the business day prior to your procedure.  (If you have any questions regarding your arrival time for the Electrophysiology Lab, please call the EP Lab at [phone].)    PRE-PROCEDURE APPOINTMENTS:  Completed on 9/7 Office visit to update history and physical (requirement within 30 days of procedure)  with Dr. Jerrye Bushy. Betti Cruz at [facility]       9/21 Pre-Admission lab work: BMP, CBC, and Magnesium at the [facility] [place] [facility].          SPECIAL MEDICATION INSTRUCTIONS  Nothing to eat or drink after midnight before your procedure.  Take your prescription medications with a sip of water as instructed.  No caffeine for 24 hours prior to your procedure.     Any new prescriptions will be sent to your pharmacy listed on file with [place].   HOLD ALL over the counter vitamins or supplements on the morning of your procedure.  Diuretics: Aldactone (spironolactone) -- hold the morning of your procedure.  and Bumex (bumetanide) -- hold the morning of your procedure.   Hypoglycemics: Jardiance--Hold the morning of your procedure.        Additional Instructions  If you wear CPAP, please bring your mask and machine with you to the hospital.    Take a bath or shower with anti-bacterial soap the evening before, or the morning of the procedure. We will give this to you at your office visit.     Bring photo ID and your health insurance card(s).    Arrange for a driver to take you home from the hospital.    Bring an accurate list of your current medications with you to the hospital (all meds and supplements taken daily).    Wear comfortable clothes and don't bring valuables, other than photo identification card, with you to the hospital.    Please pack a bag for an overnight stay.     Please review your pre-procedure instructions and call the office at [phone] with any questions. You may ask to speak with any of Dr. Rodena Medin Noheria's nurses. There are several of [place] in the office that can assist you. For questions regarding post procedure care or restrictions please refer to the Your Care Instructions included in the  a Generator Change Packet.     ALLERGIES  Allergies   Allergen Reactions    Chlorhexidine BLISTERS and EDEMA       CURRENT MEDICATIONS  Outpatient Encounter Medications as of 11/05/2020   Medication Sig Dispense Refill    acetaminophen (TYLENOL) 325 mg tablet Take two tablets by mouth every 6 hours as needed for Pain. 30 tablet 0    albuterol 0.083% (PROVENTIL) 2.5  mg /3 mL (0.083 %) nebulizer solution Inhale 3 mL solution by nebulizer as directed every 6 hours as needed for Wheezing or Shortness of Breath. Indications: asthma 270 mL 11    albuterol sulfate (PROAIR HFA) 90 mcg/actuation HFA aerosol inhaler Inhale two puffs by mouth into the lungs every 4 hours as needed for Wheezing or Shortness of Breath. (Patient not taking: Reported on 10/27/2020) 18 g 11    allopurinoL (ZYLOPRIM) 300 mg tablet Take one tablet by mouth daily. Take with food. 90 tablet 1    AMITR/GABAPEN/EMU OIL 05/24/08% CREAM (COMPOUND) Apply topically to affected area three times daily as needed. 100 g 3    arformoteroL (BROVANA) 15 mcg/2 mL nebulizer solution Inhale 2 mL solution by nebulizer as directed twice daily. Dx: J44.9 120 mL 11    ASCORBIC ACID-ASCORBATE SODIUM PO Take 500 mg by mouth every morning.      atorvastatin (LIPITOR) 40 mg tablet Take one tablet by mouth daily. 90 tablet 1    azelastine (ASTELIN) 137 mcg (0.1 %) nasal spray Apply two sprays to each nostril as directed twice daily. Use in each nostril as directed 30 mL 5    benzonatate (TESSALON PERLES) 100 mg capsule Take one capsule by mouth every 8 hours. 21 capsule 0    budesonide (PULMICORT) 0.25 mg/2 mL nebulizer solution Inhale 2 mL solution by nebulizer as directed twice daily. Dx: J44.9 120 mL 11    bumetanide (BUMEX) 1 mg tablet TAKE 2 TABLETS BY MOUTH TWICE DAILY 450 tablet 3    cholecalciferol (VITAMIN D-3) 1,000 units tablet Take 1,000 Units by mouth daily.      duloxetine DR (CYMBALTA) 60 mg capsule Take one capsule by mouth daily. 90 capsule 1    empagliflozin (JARDIANCE) 25 mg tablet Take one tablet by mouth daily. 90 tablet 3    finasteride (PROSCAR) 5 mg tablet Take one tablet by mouth daily. 90 tablet 3    fish oil- omega 3-DHA/EPA 300/1,000 mg capsule Take 1 capsule by mouth daily.      flash glucose scanning reader (FREESTYLE LIBRE) reader Use as directed. 1 each 0    flash glucose sensor (FREESTYLE LIBRE 14 DAY SENSOR) sensor Type 2 diabetes  Indications: type 2 diabetes mellitus 1 each 0    gabapentin (NEURONTIN) 100 mg capsule TAKE 4 CAPSULES BY MOUTH TWICE DAILY 90 capsule 0    insulin pen needles (disposable) (BD ULTRA-FINE MINI PEN NEEDLE) 31 gauge x 3/16 pen needle Use with insulin pens 300 each 3    ipratropium bromide (ATROVENT) 21 mcg (0.03 %) nasal spray Apply two sprays to each nostril as directed every 12 hours. (Patient not taking: Reported on 10/27/2020) 30 mL 3    levoFLOXacin (LEVAQUIN) 750 mg tablet Take one tablet by mouth daily. 7 tablet 0    magnesium oxide 400 mg magnesium capsule Take one capsule by mouth twice daily. 60 capsule 11    metOLazone (ZAROXOLYN) 2.5 mg tablet Take one tablet by mouth daily as needed. PER OUTPATIENT CARDIOLOGY PROVIDER AS NEEDED FOR FLUID RETENTION 90 tablet 0    Miscellaneous Medical Supply misc itted for LE gradual compression stockings at a medical supply store 1 each 1    Miscellaneous Medical Supply misc Vancomycin 1,500 mg IV every 24hours starting 06/25/20 x 4 days (14 total antibiotic days), please draw vancomycin level per your protocol at [facility]. 1 each 0    Miscellaneous Medical Supply misc Rx: Please wrap legs with ACE  bandage from toes as high up to the leg as possible bilaterally  Dx: Lymphedema 2 each 0    montelukast (SINGULAIR) 10 mg tablet Take one tablet by mouth at bedtime daily. 90 tablet 2    nitroglycerin (NITROSTAT) 0.4 mg tablet Place 0.4 mg under tongue every 5 minutes as needed for Chest Pain. Max of 3 tablets, call 911.      nystatin (MYCOSTATIN) 100,000 unit/g topical cream Apply  topically to affected area twice daily. 30 g 3    pantoprazole DR (PROTONIX) 40 mg tablet Take one tablet by mouth twice daily. 180 tablet 3    polyethylene glycol 3350 (MIRALAX) 17 g packet Take two packets by mouth twice daily. 12 each 3    predniSONE (DELTASONE) 20 mg tablet Take one tablet by mouth daily. 10 tablet 1    rivaroxaban (XARELTO) 20 mg tablet Take one tablet by mouth daily with dinner. Take with food. 90 tablet 3    semaglutide (OZEMPIC) 0.25 mg or 0.5 mg(2 mg/1.5 mL) injection PEN Inject one-half mg under the skin every 7 days. 1.5 mL 1    sodium chloride (HYPER-SAL) 3.5 % inhalation solution Inhale 4 mL by mouth into the lungs twice daily. Dx: J47.9 240 mL 11    spironolactone (ALDACTONE) 25 mg tablet Take one tablet by mouth twice daily. Take with food. 180 tablet 3    tamsulosin (FLOMAX) 0.4 mg capsule Take one capsule by mouth daily. Do not crush, chew or open capsules. Take 30 minutes following the same meal each day. 90 capsule 3    traMADoL (ULTRAM) 50 mg tablet Take one tablet by mouth every 8 hours as needed for Pain. 30 tablet 0    zinc sulfate 220 mg (50 mg elemental zinc) capsule Take 220 mg by mouth daily.       Facility-Administered Encounter Medications as of 11/05/2020   Medication Dose Route Frequency Provider Last Rate Last Admin    [START ON 11/08/2020] aspirin tablet 325 mg  325 mg Oral ONCE Donnelly Stager, MD         _________________________________________  Form completed by: Meryle Ready, RN  Date completed: 11/05/20  Method: Via MyChart.

## 2020-11-05 NOTE — Telephone Encounter
Scheduled the patient for a device check on 9/21. Wife, [name] aware

## 2020-11-05 NOTE — Telephone Encounter
-----   Message from Winfred Burn, RN sent at 11/05/2020  3:18 PM CDT -----  Regarding: FW: ERI on 11/04/20 @ 1747/ VT at rates <detection and ongoing fl overload  YMR said set the zones to 125-171 bpm keep the rest the same.      ----- Message -----  From: Meryle Ready, RN  Sent: 11/05/2020  12:31 PM CDT  To: Winfred Burn, RN  Subject: FW: ERI on 11/04/20 @ 1747/ VT at rates <dete#    Device team is asking if YMR thinks changing the VT monitor zone would benefit the patient. Can you review this with him?  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 showing ~6-7 seconds (19 beats) of NSVT ~135-172bpm (EGM shows episode starts and remains <detection rate for ~3-4sec (detection starts at 171bpm.  it is also noted the "AMS episode on 10/30/20 @ 1119 appears to show "slow VT" or JT ~135bpm for ~5sec/12beats-->may consider EP review for possible VT monitor zone at rates <171bpm to better assess ventricular arrhythmia burden??  # Heart Failure Diagnostic elevated: Elevated ongoing for 13days at time of transmission    Please review full report for details and follow up as needed-  Thanks, Steph/Device

## 2020-11-05 NOTE — Telephone Encounter
-----   Message -----   From: Golda Acre, LPN   Sent: 0/86/5784  2:54 PM CDT   To: Cvm Nurse Ep Team A   Subject: YMR- f/u procedure ?                 VM on triage line from [facility] in [facility] #[id number] at 2:18pm.   York Spaniel that he saw PCP Dr. Crecencio Mc yesterday about his procedure with YMR on 11-08-20.   In his notes he says "Overall, I think that the risk of the procedure probably outweigh the risk of continuing with oral anticoagulation".   He would like to make sure he, YMR and Dr. Lambert Mody are all on the same page.   Question: is he still going to have procedure on Monday?           Spoke to the patient's wife. She is very upset that PAT did not call her back. She states she was expecting a call back from them and they never called back. She states they also told her they were going to message YMR, Dr. [name], and Dr. Crecencio Mc about the anesthesia risk and she reports that Dr. Crecencio Mc wasn't aware until their OV on 9/14.       I apologized to the patient and let her know we received a message yesterday and were trying to review with YMR. The patient and her would like to cancel his procedure at this time due to the risks        Case cancel message sent

## 2020-11-08 ENCOUNTER — Encounter: Admit: 2020-11-08 | Discharge: 2020-11-08 | Payer: MEDICARE

## 2020-11-08 DIAGNOSIS — L03119 Cellulitis of unspecified part of limb: Secondary | ICD-10-CM

## 2020-11-08 DIAGNOSIS — Z4502 Encounter for adjustment and management of automatic implantable cardiac defibrillator: Secondary | ICD-10-CM

## 2020-11-08 DIAGNOSIS — R600 Localized edema: Secondary | ICD-10-CM

## 2020-11-08 DIAGNOSIS — S81809A Unspecified open wound, unspecified lower leg, initial encounter: Secondary | ICD-10-CM

## 2020-11-08 MED ORDER — DOXYCYCLINE HYCLATE 100 MG PO CAP
100 mg | ORAL_CAPSULE | Freq: Two times a day (BID) | ORAL | 0 refills | 8.00000 days | Status: AC
Start: 2020-11-08 — End: ?

## 2020-11-08 NOTE — Progress Notes
11/08/20   SW faxed patient's referral for wound care to [facility] (f. [phone]). Agency contact was provided by patient's spouse.

## 2020-11-08 NOTE — Progress Notes
Medicare is listed as patient's primary insurance coverage.  Pre-certification is not required for hospitalizations.

## 2020-11-08 NOTE — Telephone Encounter
Routing to Dr. Crecencio Mc to advise.    Would you like me to set up telehealth visit today with [facility] ([place]  Etta Grandchild, RN

## 2020-11-09 ENCOUNTER — Encounter: Admit: 2020-11-09 | Discharge: 2020-11-09 | Payer: MEDICARE

## 2020-11-09 NOTE — Progress Notes
11/09/20   14:10 SW faxed patient's 9/14 amended visit note again to [facility] (f. [phone], [place]isha [place] office. Previous fax was to [place] office)

## 2020-11-09 NOTE — Progress Notes
11/09/20   SW faxed amended visit note via Epic. [place] with [employer] had requested an amendment mentioning patient wounds. She also requested instruction for wound care.     Patient messaged physician on 9/19 and reported the physician response to [place]. She said home health would evaluate and call for verbals. No further needs.

## 2020-11-10 ENCOUNTER — Encounter: Admit: 2020-11-10 | Discharge: 2020-11-10 | Payer: MEDICARE

## 2020-11-10 ENCOUNTER — Ambulatory Visit: Admit: 2020-11-10 | Discharge: 2020-11-10 | Payer: MEDICARE

## 2020-11-10 DIAGNOSIS — I10 Essential (primary) hypertension: Secondary | ICD-10-CM

## 2020-11-10 DIAGNOSIS — I2510 Atherosclerotic heart disease of native coronary artery without angina pectoris: Secondary | ICD-10-CM

## 2020-11-10 DIAGNOSIS — E782 Mixed hyperlipidemia: Secondary | ICD-10-CM

## 2020-11-10 DIAGNOSIS — Z95810 Presence of automatic (implantable) cardiac defibrillator: Secondary | ICD-10-CM

## 2020-11-10 LAB — CBC
HEMATOCRIT: 37 % — ABNORMAL LOW (ref 40–50)
MCH: 34 pg — ABNORMAL HIGH (ref 26–34)
MCHC: 33 g/dL (ref 32.0–36.0)
MCV: 104 FL — ABNORMAL HIGH (ref 80–100)
MPV: 7.8 FL (ref 7–11)
PLATELET COUNT: 227 K/UL (ref 150–400)
RBC COUNT: 3.6 M/UL — ABNORMAL LOW (ref 4.4–5.5)
RDW: 16 % — ABNORMAL HIGH (ref >60)
WBC COUNT: 7.9 K/UL — ABNORMAL LOW (ref 4.5–11.0)

## 2020-11-10 LAB — BASIC METABOLIC PANEL
POTASSIUM: 3.8 MMOL/L (ref 3.5–5.1)
SODIUM: 138 MMOL/L (ref 137–147)

## 2020-11-10 LAB — MAGNESIUM: MAGNESIUM: 1.6 mg/dL — ABNORMAL LOW (ref 1.6–2.6)

## 2020-11-11 ENCOUNTER — Encounter: Admit: 2020-11-11 | Discharge: 2020-11-11 | Payer: MEDICARE

## 2020-11-11 DIAGNOSIS — M5431 Sciatica, right side: Secondary | ICD-10-CM

## 2020-11-11 MED ORDER — DULOXETINE 60 MG PO CPDR
60 mg | ORAL_CAPSULE | Freq: Every day | ORAL | 1 refills | 60.00000 days | Status: AC
Start: 2020-11-11 — End: ?

## 2020-11-11 MED ORDER — ALLOPURINOL 300 MG PO TAB
ORAL_TABLET | Freq: Every day | ORAL | 0 refills | 60.00000 days | Status: AC
Start: 2020-11-11 — End: ?

## 2020-11-11 MED ORDER — ATORVASTATIN 40 MG PO TAB
ORAL_TABLET | Freq: Every day | 1 refills | Status: AC
Start: 2020-11-11 — End: ?

## 2020-11-11 NOTE — Telephone Encounter
Home Health RN called requesting a return call to discuss treatment to patient's legs.    I returned call LVM with my contact information.    Etta Grandchild, RN

## 2020-11-11 NOTE — Telephone Encounter
Some refill protocol elements NOT Met  Medication name: Allopurinol  Medication Strength: 350m tablet      Labs due and Abnormal labs   Uric Acid in normal range and within 360 days    Cr in normal range and within 360 days    eGFR in normal range and within 360 days     Next office visit: 12/06/20.    Routed to Provider     LTruitt Leep RN

## 2020-11-12 ENCOUNTER — Encounter: Admit: 2020-11-12 | Discharge: 2020-11-12 | Payer: MEDICARE

## 2020-11-12 DIAGNOSIS — J302 Other seasonal allergic rhinitis: Secondary | ICD-10-CM

## 2020-11-12 DIAGNOSIS — I509 Heart failure, unspecified: Secondary | ICD-10-CM

## 2020-11-12 DIAGNOSIS — R053 Chronic cough: Secondary | ICD-10-CM

## 2020-11-12 DIAGNOSIS — I5042 Chronic combined systolic (congestive) and diastolic (congestive) heart failure: Secondary | ICD-10-CM

## 2020-11-12 DIAGNOSIS — I2510 Atherosclerotic heart disease of native coronary artery without angina pectoris: Secondary | ICD-10-CM

## 2020-11-12 DIAGNOSIS — J449 Chronic obstructive pulmonary disease, unspecified: Secondary | ICD-10-CM

## 2020-11-12 DIAGNOSIS — Z9981 Dependence on supplemental oxygen: Secondary | ICD-10-CM

## 2020-11-12 DIAGNOSIS — Z8614 Personal history of Methicillin resistant Staphylococcus aureus infection: Secondary | ICD-10-CM

## 2020-11-12 DIAGNOSIS — L03116 Cellulitis of left lower limb: Secondary | ICD-10-CM

## 2020-11-12 DIAGNOSIS — M954 Acquired deformity of chest and rib: Secondary | ICD-10-CM

## 2020-11-12 DIAGNOSIS — U071 Pneumonia due to COVID-19 virus: Secondary | ICD-10-CM

## 2020-11-12 DIAGNOSIS — D509 Iron deficiency anemia, unspecified: Secondary | ICD-10-CM

## 2020-11-12 DIAGNOSIS — Z8679 Personal history of other diseases of the circulatory system: Secondary | ICD-10-CM

## 2020-11-12 DIAGNOSIS — J45909 Unspecified asthma, uncomplicated: Secondary | ICD-10-CM

## 2020-11-12 DIAGNOSIS — I714 Abdominal aortic aneurysm, without rupture: Secondary | ICD-10-CM

## 2020-11-12 DIAGNOSIS — K219 Gastro-esophageal reflux disease without esophagitis: Secondary | ICD-10-CM

## 2020-11-12 DIAGNOSIS — I10 Essential (primary) hypertension: Secondary | ICD-10-CM

## 2020-11-12 DIAGNOSIS — M961 Postlaminectomy syndrome, not elsewhere classified: Secondary | ICD-10-CM

## 2020-11-12 DIAGNOSIS — J189 Pneumonia, unspecified organism: Secondary | ICD-10-CM

## 2020-11-12 DIAGNOSIS — E119 Type 2 diabetes mellitus without complications: Secondary | ICD-10-CM

## 2020-11-12 DIAGNOSIS — N183 CKD (chronic kidney disease) stage 3, GFR 30-59 ml/min (HCC): Secondary | ICD-10-CM

## 2020-11-12 DIAGNOSIS — G4733 Obstructive sleep apnea (adult) (pediatric): Secondary | ICD-10-CM

## 2020-11-12 DIAGNOSIS — I4891 Unspecified atrial fibrillation: Secondary | ICD-10-CM

## 2020-11-12 DIAGNOSIS — Z95828 Presence of other vascular implants and grafts: Secondary | ICD-10-CM

## 2020-11-12 DIAGNOSIS — J479 Bronchiectasis, uncomplicated: Secondary | ICD-10-CM

## 2020-11-12 DIAGNOSIS — N401 Enlarged prostate with lower urinary tract symptoms: Secondary | ICD-10-CM

## 2020-11-12 DIAGNOSIS — R609 Edema, unspecified: Secondary | ICD-10-CM

## 2020-11-12 DIAGNOSIS — R0600 Dyspnea, unspecified: Secondary | ICD-10-CM

## 2020-11-12 DIAGNOSIS — E782 Mixed hyperlipidemia: Secondary | ICD-10-CM

## 2020-11-12 DIAGNOSIS — I429 Cardiomyopathy, unspecified: Secondary | ICD-10-CM

## 2020-11-12 NOTE — Progress Notes
11/12/20   16:27 SW called [facility] to confirm that patient is on service for wound care. Confirmed by representative.

## 2020-11-15 ENCOUNTER — Encounter: Admit: 2020-11-15 | Discharge: 2020-11-15 | Payer: MEDICARE

## 2020-11-15 DIAGNOSIS — E1165 Type 2 diabetes mellitus with hyperglycemia: Secondary | ICD-10-CM

## 2020-11-15 MED ORDER — OZEMPIC 0.25 MG OR 0.5 MG(2 MG/1.5 ML) SC PNIJ
0 refills | Status: AC
Start: 2020-11-15 — End: ?

## 2020-11-15 NOTE — Anesthesia Pre-Procedure Evaluation
Anesthesia Pre-Procedure Evaluation    Name: Ruben Wood.      MRN: [id number]     DOB: [birth year]     Age: 80 y.o.     Sex: male   _________________________________________________________________________     Procedure Info:   Procedure Information     Date/Time: 11/24/20 0735    Procedure: REMOVAL AND REPLACEMENT IMPLANTABLE DEFIBRILLATOR GENERATOR - MULTIPLE LEAD SYSTEM (Right ) - ST JUDE    Location: CV LAB 07 / HC2 EP LAB    Providers: Annamarie Dawley, MD          Physical Assessment  Vital Signs (last filed in past 24 hours):         Patient History   Allergies   Allergen Reactions   ? Chlorhexidine BLISTERS and EDEMA        Current Medications    Medication Directions   acetaminophen (TYLENOL) 325 mg tablet Take two tablets by mouth every 6 hours as needed for Pain.   albuterol 0.083% (PROVENTIL) 2.5 mg /3 mL (0.083 %) nebulizer solution Inhale 3 mL solution by nebulizer as directed every 6 hours as needed for Wheezing or Shortness of Breath. Indications: asthma   albuterol sulfate (PROAIR HFA) 90 mcg/actuation HFA aerosol inhaler Inhale two puffs by mouth into the lungs every 4 hours as needed for Wheezing or Shortness of Breath.   allopurinoL (ZYLOPRIM) 300 mg tablet Take 1 tablet by mouth once daily with food   AMITR/GABAPEN/EMU OIL 05/24/08% CREAM (COMPOUND) Apply topically to affected area three times daily as needed.   arformoteroL (BROVANA) 15 mcg/2 mL nebulizer solution Inhale 2 mL solution by nebulizer as directed twice daily. Dx: J44.9   ASCORBIC ACID-ASCORBATE SODIUM PO Take 500 mg by mouth every morning.   atorvastatin (LIPITOR) 40 mg tablet Take 1 tablet by mouth once daily   azelastine (ASTELIN) 137 mcg (0.1 %) nasal spray Apply two sprays to each nostril as directed twice daily. Use in each nostril as directed   benzonatate (TESSALON PERLES) 100 mg capsule Take one capsule by mouth every 8 hours.   budesonide (PULMICORT) 0.25 mg/2 mL nebulizer solution Inhale 2 mL solution by nebulizer as directed twice daily. Dx: J44.9   bumetanide (BUMEX) 1 mg tablet TAKE 2 TABLETS BY MOUTH TWICE DAILY   cholecalciferol (VITAMIN D-3) 1,000 units tablet Take 1,000 Units by mouth daily.   doxycycline hyclate (VIBRAMYCIN) 100 mg capsule Take one capsule by mouth twice daily for 7 days.   duloxetine DR (CYMBALTA) 60 mg capsule Take 1 capsule by mouth once daily   empagliflozin (JARDIANCE) 25 mg tablet Take one tablet by mouth daily.   finasteride (PROSCAR) 5 mg tablet Take one tablet by mouth daily.   fish oil- omega 3-DHA/EPA 300/1,000 mg capsule Take 1 capsule by mouth daily.   flash glucose scanning reader (FREESTYLE LIBRE) reader Use as directed.   flash glucose sensor (FREESTYLE LIBRE 14 DAY SENSOR) sensor Type 2 diabetes  Indications: type 2 diabetes mellitus   gabapentin (NEURONTIN) 100 mg capsule TAKE 4 CAPSULES BY MOUTH TWICE DAILY   insulin pen needles (disposable) (BD ULTRA-FINE MINI PEN NEEDLE) 31 gauge x 3/16 pen needle Use with insulin pens   ipratropium bromide (ATROVENT) 21 mcg (0.03 %) nasal spray Apply two sprays to each nostril as directed every 12 hours.   levoFLOXacin (LEVAQUIN) 750 mg tablet Take one tablet by mouth daily.   magnesium oxide 400 mg magnesium capsule Take one capsule by mouth twice daily.  metOLazone (ZAROXOLYN) 2.5 mg tablet Take one tablet by mouth daily as needed. PER OUTPATIENT CARDIOLOGY PROVIDER AS NEEDED FOR FLUID RETENTION   Miscellaneous Medical Supply misc itted for LE gradual compression stockings at a medical supply store   Miscellaneous Medical Supply misc Vancomycin 1,500 mg IV every 24hours starting 06/25/20 x 4 days (14 total antibiotic days), please draw vancomycin level per your protocol at [facility].   Miscellaneous Medical Supply misc Rx: Please wrap legs with ACE bandage from toes as high up to the leg as possible bilaterally  Dx: Lymphedema   montelukast (SINGULAIR) 10 mg tablet Take one tablet by mouth at bedtime daily.   nitroglycerin (NITROSTAT) 0.4 mg tablet Place 0.4 mg under tongue every 5 minutes as needed for Chest Pain. Max of 3 tablets, call 911.   nystatin (MYCOSTATIN) 100,000 unit/g topical cream Apply  topically to affected area twice daily.   OZEMPIC 0.25 mg or 0.5 mg(2 mg/1.5 mL) injection PEN INJECT 1/2 (ONE-HALF) MG SUB-Q ONCE A WEEK   pantoprazole DR (PROTONIX) 40 mg tablet Take one tablet by mouth twice daily.   polyethylene glycol 3350 (MIRALAX) 17 g packet Take two packets by mouth twice daily.   predniSONE (DELTASONE) 20 mg tablet Take two tablets by mouth daily for 5 days.   rivaroxaban (XARELTO) 20 mg tablet Take one tablet by mouth daily with dinner. Take with food.   sodium chloride (HYPER-SAL) 3.5 % inhalation solution Inhale 4 mL by mouth into the lungs twice daily. Dx: J47.9   spironolactone (ALDACTONE) 25 mg tablet Take one tablet by mouth twice daily. Take with food.   tamsulosin (FLOMAX) 0.4 mg capsule Take one capsule by mouth daily. Do not crush, chew or open capsules. Take 30 minutes following the same meal each day.   traMADoL (ULTRAM) 50 mg tablet Take one tablet by mouth every 8 hours as needed for Pain.   zinc sulfate 220 mg (50 mg elemental zinc) capsule Take 220 mg by mouth daily.         Review of Systems/Medical History        PONV Screening: Non-smoker  No history of anesthetic complications  No family history of anesthetic complications        Pulmonary       Not a current smoker (former smoker, 80 pack years)        Asthma    COPD (with chronic bronhciectasis)      Pneumonia (COVID PNA )      Home oxygen use (4L continuous during day, CPAP HS)        Sleep apnea          Interventions: CPAP; compliant      Mixed restrictive and obstructive airway disease  Pt follows with Dr. Lambert Mody from Pulmonary team  NEBS daily, albuterol daily  Pt treated with Abx and steroid 10/23/20 [facility] [place] [facility]) for exacerbation-Dr Lambert Mody has been notified by pt        Cardiovascular         Exercise tolerance: <4 METS (3.30 METS per DASI)        CIED  : CRT-D              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)      CHF (systolic and diastolic HF; CardioMEMs; bumex increased ~ 2 weeks ago )      Hyperlipidemia (statin)      Dyspnea on exertion          S/p left pulmonary artery pressure sensor implant placement (CardioMEMs)       GI/Hepatic/Renal         GERD (PPI),      Renal disease (CKD)      Neuro/Psych           Psychiatric history          Depression      Musculoskeletal         Arthritis      Endocrine/Other       Diabetes (A1c 6.7- 03/2020)      Anemia (IDA, pt's last iron infusion 08/2020)      Obesity (BMI 39)      Prednisone  Hypogammaglobulinemia-recurrent infections     Physical Exam    Airway Findings      Mallampati: IV      TM distance: >3 FB      Neck ROM: full      Mouth opening: good    Cardiovascular Findings:       Rate: normal      Other findings: peripheral edema (trace BLE)      No murmur, no JVD    Pulmonary Findings:    Decreased breath sounds.      Comments: 4L BNC      Abdominal Findings:       Obese    Neurological Findings:       Alert and oriented x 3    Constitutional findings:       No acute distress       Diagnostic Tests  Hematology:   Lab Results   Component Value Date    HGB 12.6 11/10/2020    HCT 37.5 11/10/2020    PLTCT 227 11/10/2020    WBC 7.9 11/10/2020    NEUT 79 06/15/2020    ANC 6.99 06/15/2020    LYMPH 18 04/20/2020    ALC 0.98 06/15/2020    MONA 7 06/15/2020    AMC 0.64 06/15/2020    EOSA 2 06/15/2020    ABC 0.05 06/15/2020    MCV 104.1 11/10/2020    MCH 34.8 11/10/2020    MCHC 33.4 11/10/2020    MPV 7.8 11/10/2020    RDW 16.3 11/10/2020         General Chemistry:   Lab Results   Component Value Date    NA 138 11/10/2020    K 3.8 11/10/2020    CL 91 11/10/2020    CO2 34 11/10/2020    GAP 13 11/10/2020    BUN 28 11/10/2020  CR 1.52 11/10/2020    GLU 112 11/10/2020    CA 9.5 11/10/2020    ALBUMIN 3.8 06/15/2020    LACTIC 0.9 12/05/2018    OBSCA 1.03 10/26/2012    MG 1.6 11/10/2020    TOTBILI 0.8 06/15/2020    PO4 4.1 03/08/2020      Coagulation:   Lab Results   Component Value Date    PTT 30.9 12/15/2018    INR 1.0 03/05/2020     ECG 10/27/2020  Atrial sensed, ventricular paced rate 103 bpm    Echo 11/26/2019  Moderately reduced left ventricular systolic function. LVEF 35%  Moderate left atrial enlargement.   Mild right atrial enlargement.   Mitral annular calcification with mild stenosis.  Mean gradient is 3-4 mmHg at a heart rate of 61 BPM.  Moderate to severe regurgitation is present.   No pericardial effusion.   Estimated peak systolic pulmonary artery pressure is 37 mmHg.     Family NP Office Visit (11/12/20) - wearing 2L O2. Coughing clear mucus. Some wheezing. Started on Levaquin + prednisone 40mg  daily x 5 days.    PAC Addendum (11/15/20) - Ruben Wood  Pt assessed 9/7 in [facility] in preparation for a planned Watchman procedure. In coordination with his pulmonologist (Comfort), it was decided that the risks of a general anesthesia outweighed the risks of continuing oral anticoagulation. Per his pulmonologist on 9/14, He has very poor pulmonary function and has frequent flares of his bronchiectasis as well as his asthma.  Just over the last couple of months, he is required 3 rounds of prednisone and levofloxacin. I am concerned about him undergoing general anesthesia at this time.  If we can get his lungs in a more stable state, then I think it would be reasonable to pursue this.      Anesthesia Plan    ASA score: 4         Informed Consent  Use of blood products discussed with patient         ?

## 2020-11-16 ENCOUNTER — Encounter: Admit: 2020-11-16 | Discharge: 2020-11-16 | Payer: MEDICARE

## 2020-11-16 MED FILL — BUDESONIDE 0.25 MG/2 ML IN NBSP: 0.25 mg/2 mL | RESPIRATORY_TRACT | 30 days supply | Qty: 120 | Fill #6 | Status: AC

## 2020-11-16 MED FILL — SODIUM CHLORIDE 3.5 % IN NEBU: 3.5 % | RESPIRATORY_TRACT | 30 days supply | Qty: 240 | Fill #4 | Status: AC

## 2020-11-16 MED FILL — ALBUTEROL SULFATE 2.5 MG /3 ML (0.083 %) IN NEBU: 2.5 mg /3 mL (0.083 %) | RESPIRATORY_TRACT | 7 days supply | Qty: 75 | Fill #5 | Status: AC

## 2020-11-16 MED FILL — ALBUTEROL SULFATE 2.5 MG /3 ML (0.083 %) IN NEBU: 2.5 mg /3 mL (0.083 %) | RESPIRATORY_TRACT | 12 days supply | Qty: 150 | Fill #6 | Status: AC

## 2020-11-17 ENCOUNTER — Encounter: Admit: 2020-11-17 | Discharge: 2020-11-17 | Payer: MEDICARE

## 2020-11-17 MED FILL — ARFORMOTEROL 15 MCG/2 ML IN NEBU: 15 mcg/2 mL | RESPIRATORY_TRACT | 30 days supply | Qty: 120 | Fill #6 | Status: AC

## 2020-11-18 ENCOUNTER — Ambulatory Visit: Admit: 2020-11-18 | Discharge: 2020-11-18 | Payer: MEDICARE

## 2020-11-24 ENCOUNTER — Encounter: Admit: 2020-11-24 | Discharge: 2020-11-24 | Payer: MEDICARE

## 2020-11-24 DIAGNOSIS — N183 CKD (chronic kidney disease) stage 3, GFR 30-59 ml/min (HCC): Secondary | ICD-10-CM

## 2020-11-24 DIAGNOSIS — I4891 Unspecified atrial fibrillation: Secondary | ICD-10-CM

## 2020-11-24 DIAGNOSIS — I5042 Chronic combined systolic (congestive) and diastolic (congestive) heart failure: Secondary | ICD-10-CM

## 2020-11-24 DIAGNOSIS — M954 Acquired deformity of chest and rib: Secondary | ICD-10-CM

## 2020-11-24 DIAGNOSIS — J302 Other seasonal allergic rhinitis: Secondary | ICD-10-CM

## 2020-11-24 DIAGNOSIS — Z95828 Presence of other vascular implants and grafts: Secondary | ICD-10-CM

## 2020-11-24 DIAGNOSIS — I509 Heart failure, unspecified: Secondary | ICD-10-CM

## 2020-11-24 DIAGNOSIS — I714 AAA (abdominal aortic aneurysm): Secondary | ICD-10-CM

## 2020-11-24 DIAGNOSIS — Z8679 Personal history of other diseases of the circulatory system: Secondary | ICD-10-CM

## 2020-11-24 DIAGNOSIS — U071 Pneumonia due to COVID-19 virus: Secondary | ICD-10-CM

## 2020-11-24 DIAGNOSIS — Z8614 Personal history of Methicillin resistant Staphylococcus aureus infection: Secondary | ICD-10-CM

## 2020-11-24 DIAGNOSIS — J189 Pneumonia, unspecified organism: Secondary | ICD-10-CM

## 2020-11-24 DIAGNOSIS — J479 Bronchiectasis, uncomplicated: Secondary | ICD-10-CM

## 2020-11-24 DIAGNOSIS — E119 Type 2 diabetes mellitus without complications: Secondary | ICD-10-CM

## 2020-11-24 DIAGNOSIS — G4733 Obstructive sleep apnea (adult) (pediatric): Secondary | ICD-10-CM

## 2020-11-24 DIAGNOSIS — J449 Chronic obstructive pulmonary disease, unspecified: Secondary | ICD-10-CM

## 2020-11-24 DIAGNOSIS — J45909 Unspecified asthma, uncomplicated: Secondary | ICD-10-CM

## 2020-11-24 DIAGNOSIS — Z9981 Dependence on supplemental oxygen: Secondary | ICD-10-CM

## 2020-11-24 DIAGNOSIS — N401 Enlarged prostate with lower urinary tract symptoms: Secondary | ICD-10-CM

## 2020-11-24 DIAGNOSIS — M961 Postlaminectomy syndrome, not elsewhere classified: Secondary | ICD-10-CM

## 2020-11-24 DIAGNOSIS — I429 Cardiomyopathy, unspecified: Secondary | ICD-10-CM

## 2020-11-24 DIAGNOSIS — L03116 Cellulitis of left lower limb: Secondary | ICD-10-CM

## 2020-11-24 DIAGNOSIS — R609 Edema, unspecified: Secondary | ICD-10-CM

## 2020-11-24 DIAGNOSIS — I10 Essential (primary) hypertension: Secondary | ICD-10-CM

## 2020-11-24 DIAGNOSIS — R0600 Dyspnea, unspecified: Secondary | ICD-10-CM

## 2020-11-24 DIAGNOSIS — K219 Gastro-esophageal reflux disease without esophagitis: Secondary | ICD-10-CM

## 2020-11-24 DIAGNOSIS — E782 Mixed hyperlipidemia: Secondary | ICD-10-CM

## 2020-11-24 DIAGNOSIS — R053 Chronic cough: Secondary | ICD-10-CM

## 2020-11-24 DIAGNOSIS — D509 Iron deficiency anemia, unspecified: Secondary | ICD-10-CM

## 2020-11-24 DIAGNOSIS — I2510 Atherosclerotic heart disease of native coronary artery without angina pectoris: Secondary | ICD-10-CM

## 2020-11-24 MED ADMIN — CEFAZOLIN 1 GRAM IJ SOLR [1445]: 500 mL | @ 16:00:00 | Stop: 2020-11-24 | NDC 00143992490

## 2020-11-24 MED ADMIN — BUPIVACAINE (PF) 0.25 % (2.5 MG/ML) IJ SOLN [87866]: 10 mL | @ 16:00:00 | Stop: 2020-11-24 | NDC 00409155918

## 2020-11-24 MED ADMIN — FENTANYL CITRATE (PF) 50 MCG/ML IJ SOLN [3037]: 25 ug | INTRAVENOUS | @ 16:00:00 | Stop: 2020-11-24 | NDC 00409909425

## 2020-11-24 MED ADMIN — SODIUM CHLORIDE 0.9 % IV SOLP [27838]: 1000.000 mL | INTRAVENOUS | @ 12:00:00 | Stop: 2020-11-24 | NDC 00338004904

## 2020-11-24 MED ADMIN — LIDOCAINE (PF) 10 MG/ML (1 %) IJ SOLN [95838]: 10 mL | @ 16:00:00 | Stop: 2020-11-24 | NDC 55150016205

## 2020-11-24 MED ADMIN — DEXTROSE 5% IN WATER IV SOLP [2364]: 3 g | INTRAVENOUS | @ 15:00:00 | Stop: 2020-11-24 | NDC 00338001731

## 2020-11-24 MED ADMIN — CEFAZOLIN 10 GRAM IJ SOLR [1446]: 3 g | INTRAVENOUS | @ 15:00:00 | Stop: 2020-11-24 | NDC 00143998391

## 2020-11-24 MED ADMIN — SODIUM CHLORIDE 0.9 % IV SOLP [27838]: 500 mL | @ 16:00:00 | Stop: 2020-11-24 | NDC 00338004904

## 2020-11-24 MED ADMIN — MIDAZOLAM 1 MG/ML IJ SOLN [10607]: 0.5 mg | INTRAVENOUS | @ 15:00:00 | Stop: 2020-11-24 | NDC 00409230516

## 2020-11-24 MED ADMIN — FENTANYL CITRATE (PF) 50 MCG/ML IJ SOLN [3037]: 25 ug | INTRAVENOUS | @ 15:00:00 | Stop: 2020-11-24 | NDC 00409909425

## 2020-11-26 ENCOUNTER — Encounter: Admit: 2020-11-26 | Discharge: 2020-11-26 | Payer: MEDICARE

## 2020-11-29 ENCOUNTER — Encounter: Admit: 2020-11-29 | Discharge: 2020-11-29 | Payer: MEDICARE

## 2020-11-29 DIAGNOSIS — R0600 Dyspnea, unspecified: Secondary | ICD-10-CM

## 2020-11-29 DIAGNOSIS — Z9981 Dependence on supplemental oxygen: Secondary | ICD-10-CM

## 2020-11-29 DIAGNOSIS — Z8679 Personal history of other diseases of the circulatory system: Secondary | ICD-10-CM

## 2020-11-29 DIAGNOSIS — M954 Acquired deformity of chest and rib: Secondary | ICD-10-CM

## 2020-11-29 DIAGNOSIS — N183 CKD (chronic kidney disease) stage 3, GFR 30-59 ml/min (HCC): Secondary | ICD-10-CM

## 2020-11-29 DIAGNOSIS — I429 Cardiomyopathy, unspecified: Secondary | ICD-10-CM

## 2020-11-29 DIAGNOSIS — K219 Gastro-esophageal reflux disease without esophagitis: Secondary | ICD-10-CM

## 2020-11-29 DIAGNOSIS — Z8614 Personal history of Methicillin resistant Staphylococcus aureus infection: Secondary | ICD-10-CM

## 2020-11-29 DIAGNOSIS — L03116 Cellulitis of left lower limb: Secondary | ICD-10-CM

## 2020-11-29 DIAGNOSIS — M961 Postlaminectomy syndrome, not elsewhere classified: Secondary | ICD-10-CM

## 2020-11-29 DIAGNOSIS — J449 Chronic obstructive pulmonary disease, unspecified: Secondary | ICD-10-CM

## 2020-11-29 DIAGNOSIS — I4891 Unspecified atrial fibrillation: Secondary | ICD-10-CM

## 2020-11-29 DIAGNOSIS — R053 Chronic cough: Secondary | ICD-10-CM

## 2020-11-29 DIAGNOSIS — G4733 Obstructive sleep apnea (adult) (pediatric): Secondary | ICD-10-CM

## 2020-11-29 DIAGNOSIS — I509 Heart failure, unspecified: Secondary | ICD-10-CM

## 2020-11-29 DIAGNOSIS — N401 Enlarged prostate with lower urinary tract symptoms: Secondary | ICD-10-CM

## 2020-11-29 DIAGNOSIS — D509 Iron deficiency anemia, unspecified: Secondary | ICD-10-CM

## 2020-11-29 DIAGNOSIS — J45909 Unspecified asthma, uncomplicated: Secondary | ICD-10-CM

## 2020-11-29 DIAGNOSIS — R609 Edema, unspecified: Secondary | ICD-10-CM

## 2020-11-29 DIAGNOSIS — E119 Type 2 diabetes mellitus without complications: Secondary | ICD-10-CM

## 2020-11-29 DIAGNOSIS — J302 Other seasonal allergic rhinitis: Secondary | ICD-10-CM

## 2020-11-29 DIAGNOSIS — J189 Pneumonia, unspecified organism: Secondary | ICD-10-CM

## 2020-11-29 DIAGNOSIS — I2510 Atherosclerotic heart disease of native coronary artery without angina pectoris: Secondary | ICD-10-CM

## 2020-11-29 DIAGNOSIS — J479 Bronchiectasis, uncomplicated: Secondary | ICD-10-CM

## 2020-11-29 DIAGNOSIS — I5042 Chronic combined systolic (congestive) and diastolic (congestive) heart failure: Secondary | ICD-10-CM

## 2020-11-29 DIAGNOSIS — U071 Pneumonia due to COVID-19 virus: Secondary | ICD-10-CM

## 2020-11-29 DIAGNOSIS — Z95828 Presence of other vascular implants and grafts: Secondary | ICD-10-CM

## 2020-11-29 DIAGNOSIS — I714 AAA (abdominal aortic aneurysm): Secondary | ICD-10-CM

## 2020-11-29 DIAGNOSIS — I10 Essential (primary) hypertension: Secondary | ICD-10-CM

## 2020-11-29 DIAGNOSIS — E782 Mixed hyperlipidemia: Secondary | ICD-10-CM

## 2020-11-29 NOTE — Progress Notes
Signed HH cert form received (11/11/2020 to 01/09/2021) and faxed to [facility]

## 2020-12-01 ENCOUNTER — Encounter: Admit: 2020-12-01 | Discharge: 2020-12-01 | Payer: MEDICARE

## 2020-12-02 ENCOUNTER — Encounter: Admit: 2020-12-02 | Discharge: 2020-12-02 | Payer: MEDICARE

## 2020-12-03 ENCOUNTER — Encounter: Admit: 2020-12-03 | Discharge: 2020-12-03 | Payer: MEDICARE

## 2020-12-06 ENCOUNTER — Encounter: Admit: 2020-12-06 | Discharge: 2020-12-06 | Payer: MEDICARE

## 2020-12-06 ENCOUNTER — Ambulatory Visit: Admit: 2020-12-06 | Discharge: 2020-12-06 | Payer: MEDICARE

## 2020-12-06 DIAGNOSIS — I509 Heart failure, unspecified: Secondary | ICD-10-CM

## 2020-12-06 DIAGNOSIS — U071 Pneumonia due to COVID-19 virus: Secondary | ICD-10-CM

## 2020-12-06 DIAGNOSIS — Z9981 Dependence on supplemental oxygen: Secondary | ICD-10-CM

## 2020-12-06 DIAGNOSIS — R053 Chronic cough: Secondary | ICD-10-CM

## 2020-12-06 DIAGNOSIS — J479 Bronchiectasis, uncomplicated: Secondary | ICD-10-CM

## 2020-12-06 DIAGNOSIS — L03116 Cellulitis of left lower limb: Secondary | ICD-10-CM

## 2020-12-06 DIAGNOSIS — Z8679 Personal history of other diseases of the circulatory system: Secondary | ICD-10-CM

## 2020-12-06 DIAGNOSIS — R609 Edema, unspecified: Secondary | ICD-10-CM

## 2020-12-06 DIAGNOSIS — Z8614 Personal history of Methicillin resistant Staphylococcus aureus infection: Secondary | ICD-10-CM

## 2020-12-06 DIAGNOSIS — M961 Postlaminectomy syndrome, not elsewhere classified: Secondary | ICD-10-CM

## 2020-12-06 DIAGNOSIS — Z95810 Presence of automatic (implantable) cardiac defibrillator: Secondary | ICD-10-CM

## 2020-12-06 DIAGNOSIS — I4891 Unspecified atrial fibrillation: Secondary | ICD-10-CM

## 2020-12-06 DIAGNOSIS — M954 Acquired deformity of chest and rib: Secondary | ICD-10-CM

## 2020-12-06 DIAGNOSIS — I429 Cardiomyopathy, unspecified: Secondary | ICD-10-CM

## 2020-12-06 DIAGNOSIS — N183 CKD (chronic kidney disease) stage 3, GFR 30-59 ml/min (HCC): Secondary | ICD-10-CM

## 2020-12-06 DIAGNOSIS — G4733 Obstructive sleep apnea (adult) (pediatric): Secondary | ICD-10-CM

## 2020-12-06 DIAGNOSIS — E119 Type 2 diabetes mellitus without complications: Secondary | ICD-10-CM

## 2020-12-06 DIAGNOSIS — E1165 Type 2 diabetes mellitus with hyperglycemia: Secondary | ICD-10-CM

## 2020-12-06 DIAGNOSIS — E782 Mixed hyperlipidemia: Secondary | ICD-10-CM

## 2020-12-06 DIAGNOSIS — I10 Essential (primary) hypertension: Secondary | ICD-10-CM

## 2020-12-06 DIAGNOSIS — I5042 Chronic combined systolic (congestive) and diastolic (congestive) heart failure: Secondary | ICD-10-CM

## 2020-12-06 DIAGNOSIS — R0600 Dyspnea, unspecified: Secondary | ICD-10-CM

## 2020-12-06 DIAGNOSIS — Z23 Encounter for immunization: Secondary | ICD-10-CM

## 2020-12-06 DIAGNOSIS — I714 AAA (abdominal aortic aneurysm): Secondary | ICD-10-CM

## 2020-12-06 DIAGNOSIS — J449 Chronic obstructive pulmonary disease, unspecified: Secondary | ICD-10-CM

## 2020-12-06 DIAGNOSIS — J470 Bronchiectasis with acute lower respiratory infection: Secondary | ICD-10-CM

## 2020-12-06 DIAGNOSIS — K219 Gastro-esophageal reflux disease without esophagitis: Secondary | ICD-10-CM

## 2020-12-06 DIAGNOSIS — J189 Pneumonia, unspecified organism: Secondary | ICD-10-CM

## 2020-12-06 DIAGNOSIS — J438 Other emphysema: Secondary | ICD-10-CM

## 2020-12-06 DIAGNOSIS — I2510 Atherosclerotic heart disease of native coronary artery without angina pectoris: Secondary | ICD-10-CM

## 2020-12-06 DIAGNOSIS — Z95828 Presence of other vascular implants and grafts: Secondary | ICD-10-CM

## 2020-12-06 DIAGNOSIS — B372 Candidiasis of skin and nail: Secondary | ICD-10-CM

## 2020-12-06 DIAGNOSIS — J45909 Unspecified asthma, uncomplicated: Secondary | ICD-10-CM

## 2020-12-06 DIAGNOSIS — D509 Iron deficiency anemia, unspecified: Secondary | ICD-10-CM

## 2020-12-06 DIAGNOSIS — J302 Other seasonal allergic rhinitis: Secondary | ICD-10-CM

## 2020-12-06 DIAGNOSIS — N401 Enlarged prostate with lower urinary tract symptoms: Secondary | ICD-10-CM

## 2020-12-06 MED ORDER — PREDNISONE 20 MG PO TAB
40 mg | ORAL_TABLET | Freq: Every day | ORAL | 0 refills | Status: AC
Start: 2020-12-06 — End: ?

## 2020-12-06 MED ORDER — OZEMPIC 0.25 MG OR 0.5 MG(2 MG/1.5 ML) SC PNIJ
1 mg | SUBCUTANEOUS | 3 refills | Status: AC
Start: 2020-12-06 — End: ?

## 2020-12-06 MED ORDER — NYSTATIN 100,000 UNIT/GRAM TP POWD
Freq: Four times a day (QID) | TOPICAL | 3 refills | 30.00000 days | Status: AC
Start: 2020-12-06 — End: ?

## 2020-12-06 MED ORDER — LEVOFLOXACIN 750 MG PO TAB
750 mg | ORAL_TABLET | Freq: Every day | ORAL | 0 refills | 7.00000 days | Status: AC
Start: 2020-12-06 — End: ?

## 2020-12-06 NOTE — Progress Notes
History of Present Illness  Ruben Wood. is a 80 y.o. male     Ruben Wood is here today for follow-up.  We talked a lot about his diabetes today.  His A1c is actually improved.  Big driver of this is his weight.  He has recently been on a GLP-1 agonist.  He is tolerating this well.  We have on semaglutide.  He is down a few pounds with this.  He has not any hypoglycemic episodes.  He is very inactive which is mostly result of his chronic respiratory and cardiac conditions.  He has been working on calorie restriction as well.    He continues to really struggle from a pulmonary standpoint.  He has severe asthma as well as bronchiectasis.  He gets frequent exacerbations.  He recently had exacerbation and required Levaquin and prednisone.  Today it is actually a good day for him.  He denies any significant wheezing or shortness of breath.  He is wearing his oxygen and is can as described.  We spent a lot of time reviewing his medications.  He is now on a nebulized form of budesonide and formoterol as well as a saline solution.  He wears his vest regularly.  He has not been doing the flutter valve.     Review of Systems  Per HPI    Objective:         ? acetaminophen (TYLENOL) 325 mg tablet Take two tablets by mouth every 6 hours as needed for Pain.   ? albuterol 0.083% (PROVENTIL) 2.5 mg /3 mL (0.083 %) nebulizer solution Inhale 3 mL solution by nebulizer as directed every 6 hours as needed for Wheezing or Shortness of Breath. Indications: asthma   ? albuterol sulfate (PROAIR HFA) 90 mcg/actuation HFA aerosol inhaler Inhale two puffs by mouth into the lungs every 4 hours as needed for Wheezing or Shortness of Breath.   ? allopurinoL (ZYLOPRIM) 300 mg tablet Take 1 tablet by mouth once daily with food   ? AMITR/GABAPEN/EMU OIL 05/24/08% CREAM (COMPOUND) Apply topically to affected area three times daily as needed.   ? arformoteroL (BROVANA) 15 mcg/2 mL nebulizer solution Inhale 2 mL solution by nebulizer as directed twice daily. Dx: J44.9   ? ASCORBIC ACID-ASCORBATE SODIUM PO Take 500 mg by mouth every morning.   ? atorvastatin (LIPITOR) 40 mg tablet Take 1 tablet by mouth once daily   ? azelastine (ASTELIN) 137 mcg (0.1 %) nasal spray Apply two sprays to each nostril as directed twice daily. Use in each nostril as directed   ? benzonatate (TESSALON PERLES) 100 mg capsule Take one capsule by mouth every 8 hours.   ? budesonide (PULMICORT) 0.25 mg/2 mL nebulizer solution Inhale 2 mL solution by nebulizer as directed twice daily. Dx: J44.9   ? bumetanide (BUMEX) 1 mg tablet TAKE 2 TABLETS BY MOUTH TWICE DAILY   ? cholecalciferol (VITAMIN D-3) 1,000 units tablet Take 1,000 Units by mouth daily.   ? duloxetine DR (CYMBALTA) 60 mg capsule Take 1 capsule by mouth once daily   ? empagliflozin (JARDIANCE) 25 mg tablet Take one tablet by mouth daily.   ? finasteride (PROSCAR) 5 mg tablet Take one tablet by mouth daily.   ? fish oil- omega 3-DHA/EPA 300/1,000 mg capsule Take 1 capsule by mouth daily.   ? flash glucose scanning reader (FREESTYLE LIBRE) reader Use as directed.   ? flash glucose sensor (FREESTYLE LIBRE 14 DAY SENSOR) sensor Type 2 diabetes  Indications: type 2 diabetes  mellitus   ? gabapentin (NEURONTIN) 100 mg capsule TAKE 4 CAPSULES BY MOUTH TWICE DAILY   ? insulin pen needles (disposable) (BD ULTRA-FINE MINI PEN NEEDLE) 31 gauge x 3/16 pen needle Use with insulin pens   ? ipratropium bromide (ATROVENT) 21 mcg (0.03 %) nasal spray Apply two sprays to each nostril as directed every 12 hours.   ? levoFLOXacin (LEVAQUIN) 750 mg tablet Take one tablet by mouth daily.   ? magnesium oxide 400 mg magnesium capsule Take one capsule by mouth twice daily.   ? metOLazone (ZAROXOLYN) 2.5 mg tablet Take one tablet by mouth daily as needed. PER OUTPATIENT CARDIOLOGY PROVIDER AS NEEDED FOR FLUID RETENTION   ? Miscellaneous Medical Supply misc itted for LE gradual compression stockings at a medical supply store   ? Miscellaneous Medical Supply misc Vancomycin 1,500 mg IV every 24hours starting 06/25/20 x 4 days (14 total antibiotic days), please draw vancomycin level per your protocol at [facility]   ? Miscellaneous Medical Supply misc Rx: Please wrap legs with ACE bandage from toes as high up to the leg as possible bilaterally  Dx: Lymphedema   ? montelukast (SINGULAIR) 10 mg tablet Take one tablet by mouth at bedtime daily.   ? nitroglycerin (NITROSTAT) 0.4 mg tablet Place 0.4 mg under tongue every 5 minutes as needed for Chest Pain. Max of 3 tablets, call 911.   ? nystatin (MYCOSTATIN) 100,000 unit/g topical cream Apply  topically to affected area twice daily.   ? nystatin (NYSTOP) 100,000 unit/g topical powder Apply  topically to affected area four times daily.   ? pantoprazole DR (PROTONIX) 40 mg tablet Take one tablet by mouth twice daily.   ? polyethylene glycol 3350 (MIRALAX) 17 g packet Take two packets by mouth twice daily.   ? predniSONE (DELTASONE) 20 mg tablet Take two tablets by mouth daily for 5 days.   ? rivaroxaban (XARELTO) 20 mg tablet Take one tablet by mouth daily with dinner. Take with food.   ? semaglutide (OZEMPIC) 0.25 mg or 0.5 mg(2 mg/1.5 mL) injection PEN Inject one mg under the skin every 7 days.   ? sodium chloride (HYPER-SAL) 3.5 % inhalation solution Inhale 4 mL by mouth into the lungs twice daily. 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 spironolactone (ALDACTONE) 25 mg tablet Take one tablet by mouth twice daily. Take with food.   ? tamsulosin (FLOMAX) 0.4 mg capsule Take one capsule by mouth daily. Do not crush, chew or open capsules. Take 30 minutes following the same meal each day.   ? traMADoL (ULTRAM) 50 mg tablet Take one tablet by mouth every 8 hours as needed for Pain.   ? zinc sulfate 220 mg (50 mg elemental zinc) capsule Take 220 mg by mouth daily.     Vitals:    12/06/20 1502   BP: 124/85   Pulse: 92   Temp: 36.9 ?C (98.4 ?F)   SpO2: 95%  Comment: 3 liters   PainSc: Six   Weight: 124.7 kg (275 lb)   Height: 180.3 cm (5' 11)       Body mass index is 38.35 kg/m?[name]     Physical Exam  Vitals and nursing note reviewed.   Constitutional:       General: He is not in acute distress.     Appearance: He is well-developed. He is not diaphoretic.   HENT:      Head: Normocephalic and atraumatic.   Neck:      Thyroid: No thyromegaly.  Vascular: No JVD.   Cardiovascular:      Rate and Rhythm: Normal rate and regular rhythm.      Heart sounds: Normal heart sounds. No murmur heard.    No friction rub. No gallop.   Pulmonary:      Effort: Pulmonary effort is normal. No respiratory distress.      Breath sounds: Wheezing present. No rales.      Comments: Wearing oxygen  Musculoskeletal:      Right lower leg: No edema.      Left lower leg: No edema.   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  Contiguous erythematous macular lesions with distinct borders and abdominal folds   Neurological:      General: No focal deficit present.      Mental Status: He is alert.   Psychiatric:         Mood and Affect: Mood normal.         Labwork reviewed:  Lab Results   Component Value Date/Time    HGBA1C 6.7 (H) 04/04/2020 05:40 AM    HGBA1C 5.9 11/25/2019 02:45 PM    HGBA1C 5.9 09/02/2019 09:50 AM    A1C 6.1 (A) 12/06/2020 12:00 AM    HGBPOC 14.3 04/08/2020 06:58 AM    HGBPOC 11.2 (L) 10/25/2012 10:58 AM    HGBPOC 12.9 (L) 10/25/2012 08:55 AM    MCALBR 7.2 05/18/2020 01:45 PM    TSH 2.49 03/05/2020 08:40 PM    FREET4R 0.99 12/15/2014 09:12 AM    CHOL 161 04/04/2020 05:40 AM    TRIG 152 (H) 04/04/2020 05:40 AM    HDL 53 04/04/2020 05:40 AM    LDL 87 04/04/2020 05:40 AM    NA 137 11/24/2020 07:17 AM    K 4.5 11/24/2020 07:17 AM    CL 94 (L) 11/24/2020 07:17 AM    CO2 32 (H) 11/24/2020 07:17 AM    GAP 11 11/24/2020 07:17 AM    BUN 30 (H) 11/24/2020 07:17 AM    CR 1.49 (H) 11/24/2020 07:17 AM    GLU 115 (H) 11/24/2020 07:17 AM    CA 9.5 11/24/2020 07:17 AM    PO4 4.1 03/08/2020 03:51 AM    ALBUMIN 3.8 06/15/2020 02:50 PM    TOTPROT 6.0 06/15/2020 02:50 PM    ALKPHOS 67 06/15/2020 02:50 PM    AST 13 06/15/2020 02:50 PM    ALT 9 06/15/2020 02:50 PM    TOTBILI 0.8 06/15/2020 02:50 PM    GFR 41 09/27/2020 12:00 AM    GFRAA >60 12/03/2019 04:51 AM    PSA 2.96 11/25/2018 12:00 AM              Assessment and Plan:    1. Type 2 diabetes mellitus with hyperglycemia, without long-term current use of insulin (HCC)    2. Morbid obesity with BMI of 40.0-44.9, adult (HCC)    3. Candidal intertrigo    4. Other emphysema (HCC)    5. Bronchiectasis with acute lower respiratory infection (HCC)    6. Need for COVID-19 vaccine      He is here today for follow-up.  We talked a lot about his weight today.  Also talked about his diabetes.  I can increase his semaglutide to 1.0 mg q. weekly.  I am hoping that this can help with some weight loss and also gets better glycemic control as well.  He does get frequent prednisone burst because of his pulmonary conditions,  so this is a barrier for good glycemic control.  Today, we did a fundus exam to screen for retinopathy.    He has candidal intertrigo.  I am prescribing him nystatin powder.    He continues to follow closely with pulmonary.  He has significant asthma as well as bronchiectasis.  He has frequent exacerbations.  He seems to have a good regimen currently.  I did not make medication changes today.  Talked about the underlying pathophysiology of bronchiectasis and asthma at length today in clinic.    Total of 40 minutes were spent on the same day of the visit including preparing to see the patient, obtaining and/or reviewing separately obtained history, performing a medically appropriate examination and/or evaluation, counseling and educating the patient/family/caregiver, ordering medications, tests, or procedures, referring and communication with other health care professionals, documenting clinical information in the electronic or other health record, independently interpreting results and communicating results to the patient/family/caregiver, and care coordination.         Patient Instructions     Future Appointments   Date Time Provider Department Center   12/06/2020  4:00 PM Almee Pelphrey, [name], MD [facility] IM   12/20/2020  4:00 PM [facility]OTE MONITORING [facility] C[facility] Procedur   12/22/2020 10:45 AM CT-HOSPITAL ROOM 1 (FLASH) CAT Radiology   01/20/2021  4:00 PM [facility] REMOTE MONITORING [facility] CVM Procedur   01/27/2021 10:30 AM Vanetta Shawl I, MD [facility] CVM Exam   02/21/2021  4:00 PM [facility] REMOTE MONITORING [facility] CVM Procedur   03/18/2021 11:00 AM Deveron Furlong, MD [facility] IM   03/24/2021  4:00 PM [facility] REMOTE MONITORING [facility] CVM Procedur   04/25/2021  4:00 PM [facility] REMOTE MONITORING [facility] CVM Procedur   05/26/2021  4:00 PM [facility] REMOTE MONITORING [facility] CVM Procedur   06/02/2021  1:00 PM [name], Lenice Llamas, PA-C [facility] Urology   06/27/2021  4:00 PM [facility] REMOTE MONITORING [facility] CVM Procedur       If you are on Mychart, you will now be able to read your clinic note from me.  My hope is that this will improve your engagement and insight into your health care and improve the accuracy of your medical record.  If you find inaccuracies, I apologize.  Please bring any concerns or inaccuracies to your next appointment.  Please remember that medical language and documentation is very different from how you and I speak and our dictation software is not perfect.    General Instructions:  ? How to reach me:   Please send a MyChart message to the General Medicine clinic or call Wynona Canes at [phone].    ? How to get a medication refill:  Please use the MyChart Refill request or contact your pharmacy directly to request medication refills. Please allow 48 hours.     ? How to receive your test results:  If you have signed up for MyChart, you will receive your test results and messages from me this way.  Otherwise, you will get a phone call or letter.   If you are expecting results and have not heard from my office within 2 weeks of your testing, please send a MyChart message or call my office.     ? Scheduling:  Our Scheduling phone number is [phone].  Same Day appointments are usually available. Please ask for an annual or yearly physical appointment if it has been over 1 year since our last appointment.  ? Support groups for many chronic illnesses are available through [facility]: [email or web address] or [phone].            Return in about 3 months (around 03/08/2021).

## 2020-12-06 NOTE — Telephone Encounter
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 He is not on a beta blocker or anti arrhythmic.     10/16 0932- 14 seconds     Last OV with YMR on 9/7- note not complete.     Called pt to assess. He said he did not notice anything yesterday around that time, denies symptoms.     Sent the below note to device team that was from 9/16 and asked if these changes were ever made: (Pt had device check on 9/21)    ----- Message from Winfred Burn, RN sent at 11/05/2020  3:18 PM CDT -----  Regarding: FW: ERI on 11/04/20 @ 1747/ VT at rates <detection and ongoing fl overload  YMR said set the zones to 125-171 bpm keep the rest the same.      ----- Message -----  From: Meryle Ready, RN  Sent: 11/05/2020  12:31 PM CDT  To: Winfred Burn, RN  Subject: FW: ERI on 11/04/20 @ 1747/ VT at rates <dete#    Device team is asking if YMR thinks changing the VT monitor zone would benefit the patient. Can you review this with him?

## 2020-12-06 NOTE — Patient Instructions
Future Appointments   Date Time Provider Department Center   12/06/2020  4:00 PM Aryona Sill, [name], MD [facility] IM   12/20/2020  4:00 PM [facility]OTE [facility]ITORING [facility] CVM Procedur   12/22/2020 10:45 AM CT-HOSPITAL ROOM 1 (FLASH) CAT Radiology   01/20/2021  4:00 PM [facility] REMOTE MONITORING [facility] CVM Procedur   01/27/2021 10:30 AM Vanetta Shawl I, MD [facility] CVM Exam   02/21/2021  4:00 PM [facility] REMOTE MONITORING [facility] CVM Procedur   03/18/2021 11:00 AM Deveron Furlong, MD [facility] IM   03/24/2021  4:00 PM [facility] REMOTE MONITORING [facility] CVM Procedur   04/25/2021  4:00 PM [facility] REMOTE MONITORING [facility]M CVM Procedur   05/26/2021  4:00 PM [facility] REMOTE MONITORING [facility]M CVM Procedur   06/02/2021  1:00 PM [name], Lenice Llamas, PA-C [facility] Urology   06/27/2021  4:00 PM [facility] REMOTE MONITORING [facility]M CVM Procedur       If you are on Mychart, you will now be able to read your clinic note from me.  My hope is that this will improve your engagement and insight into your health care and improve the accuracy of your medical record.  If you find inaccuracies, I apologize.  Please bring any concerns or inaccuracies to your next appointment.  Please remember that medical language and documentation is very different from how you and I speak and our dictation software is not perfect.    General Instructions:  How to reach me:   Please send a MyChart message to the General Medicine clinic or call Wynona Canes at [phone].    How to get a medication refill:  Please use the MyChart Refill request or contact your pharmacy directly to request medication refills. Please allow 48 hours.     How to receive your test results:  If you have signed up for MyChart, you will receive your test results and messages from me this way.  Otherwise, you will get a phone call or letter.   If you are expecting results and have not heard from my office within 2 weeks of your testing, please send a MyChart message or call my office. Scheduling:  Our Scheduling phone number is [phone].  Same Day appointments are usually available. Please ask for an annual or yearly physical appointment if it has been over 1 year since our last appointment.  Support groups for many chronic illnesses are available through [facility]: [email or web address] or [phone].

## 2020-12-06 NOTE — Progress Notes
Patient presents with wife for incision check. He is s/p generator change on 11/24/20 with ANO. right prepectoral incision is clean, dry, well approximated, and healing without evidence of drainage or discharge. Incision was closed using Dermabond and is dry and intact.     Patient reports significant pain last week post-op and was utilizing tramadol and over the counter tylenol regularly. Today he reports his pain has much improved and is no longer requiring supplemental pain medication. Patient also reported serosanguinous drainage on post-op day 1, but this has since resolved. No obvious drainage present today. Mild soft tissue swelling noted around site.    Incision care, including signs and symptoms of infection, and Dermabond healing process reviewed (as below). Pt verbalized understanding and will remain in phone contact.        INCISION CARE    AVOID TOPICAL MEDICATIONS  ? Do not apply liquid or ointment medications or any other product to your wound while the  DERMABOND adhesive film is in place. These may loosen the film before your wound is healed.    KEEP WOUND DRY AND PROTECTED  ? You may occasionally and briefly wet your wound in the shower or bath. Do not soak or scrub  your wound, do not swim, and avoid periods of heavy perspiration until the DERMABOND  adhesive has naturally fallen off. After showering or bathing, gently blot your wound dry with a  soft towel. If a protective dressing is being used, apply a fresh, dry bandage, being sure to keep  the tape off the DERMABOND adhesive film.  ? Apply a clean, dry bandage over the wound if necessary to protect it.  ? Protect your wound from injury until the skin has had sufficient time to heal.  ? Do not scratch, rub, or pick at the DERMABOND adhesive film. This may loosen the film before  your wound is healed.  ? Protect the wound from prolonged exposure to sunlight or tanning lamps while the film is in  place.    Your incision should gradually look better each day. Please notify our office immediately if you notice any of the following:   -an increase in swelling or redness   -any drainage   -increasing pain at the incision site  -fever over 100 degrees or chills    Instructions for Post-Device Placement  ? May drive 2 days after procedure - Completed  ? Return to work/school in 3-5 days - Completed  ? No lifting >15 pounds and no sex for one week post procedure - Completed  ? You will need to continue to not life your arm above your should for 3 more weeks of the side the implant is on.  ? No strenuous activities for 6 weeks total.  ? No baseball/golf/tennis for 90 days.

## 2020-12-06 NOTE — Telephone Encounter
-----   Message from Truddie Coco, RN sent at 12/06/2020  9:30 AM CDT -----  Regarding: Long NSVT  Good Morning-    We received an alert on pt for the following: NSVT  Total episodes: 3  Most recent and longest episode on 12/05/20 @ 0932 lasting approximately 14 seconds with V rate 164 bpm. (therapy starts at 171 bpm)    # Does YMR want to lower therapy zone for NSVT or continue to monitor? If no intervention, can we change or turn off alerts as pt has a lot of alerts for NSVT.    Please see full report in Epic for details and follow up as needed.    Thanks- Soil scientist / Device Team

## 2020-12-07 ENCOUNTER — Encounter: Admit: 2020-12-07 | Discharge: 2020-12-07 | Payer: MEDICARE

## 2020-12-10 ENCOUNTER — Encounter: Admit: 2020-12-10 | Discharge: 2020-12-10 | Payer: MEDICARE

## 2020-12-10 NOTE — Telephone Encounter
No answer, Per PHI LVM with provider instructions and sent Mychart with copy of instructions below. Requested reply to MyChart to acknowledge instructions

## 2020-12-10 NOTE — Telephone Encounter
Pearson, Rowan Blase, APRN-NP  [facility]  Caller: Unspecified (Today, 12:19 PM)  He can hold Bumex and spironolactone x1 dose (either this afternoon if he has not taken them already or tomorrow AM).

## 2020-12-10 NOTE — Telephone Encounter
Pt below PAD thresholds X 3 readings. Sent to provider to review and advise.     DAILY CardioMEMS READINGS    Goal PA Diastolic: 15 mmHg  PA Diastolic Thresholds: 12-17 mmHg            CARDIAC MEDS:    Home Medications    Medication Sig   atorvastatin (LIPITOR) 40 mg tablet Take 1 tablet by mouth once daily   bumetanide (BUMEX) 1 mg tablet TAKE 2 TABLETS BY MOUTH TWICE DAILY   empagliflozin (JARDIANCE) 25 mg tablet Take one tablet by mouth daily.   magnesium oxide 400 mg magnesium capsule Take one capsule by mouth twice daily.   metOLazone (ZAROXOLYN) 2.5 mg tablet Take one tablet by mouth daily as needed. PER OUTPATIENT CARDIOLOGY PROVIDER AS NEEDED FOR FLUID RETENTION   rivaroxaban (XARELTO) 20 mg tablet Take one tablet by mouth daily with dinner. Take with food.   spironolactone (ALDACTONE) 25 mg tablet Take one tablet by mouth twice daily. Take with food.       RECENT LABS:    Basic Metabolic Profile    Lab Results   Component Value Date/Time    NA 137 11/24/2020 07:17 AM    K 4.5 11/24/2020 07:17 AM    CA 9.5 11/24/2020 07:17 AM    CL 94 (L) 11/24/2020 07:17 AM    CO2 32 (H) 11/24/2020 07:17 AM    GAP 11 11/24/2020 07:17 AM    Lab Results   Component Value Date/Time    BUN 30 (H) 11/24/2020 07:17 AM    CR 1.49 (H) 11/24/2020 07:17 AM    GLU 115 (H) 11/24/2020 07:17 AM          UPCOMING APPOINTMENTS:    Future Appointments   Date Time Provider Department Center   12/21/2020 11:20 AM Joanie Coddington, MD [facility] SPINE   12/22/2020 10:45 AM CT-HOSPITAL ROOM 1 (FLASH) CAT Radiology   01/27/2021 10:30 AM Vanetta Shawl I, MD [facility] CVM Exam   03/18/2021 11:00 AM Deveron Furlong, MD [facility] IM   05/02/2021  1:40 PM Comfort, [name], MD [facility] IM   06/02/2021  1:00 PM [name], Lenice Llamas, PA-C [facility] Urology

## 2020-12-15 ENCOUNTER — Encounter: Admit: 2020-12-15 | Discharge: 2020-12-15 | Payer: MEDICARE

## 2020-12-20 ENCOUNTER — Encounter: Admit: 2020-12-20 | Discharge: 2020-12-20 | Payer: MEDICARE

## 2020-12-20 ENCOUNTER — Ambulatory Visit: Admit: 2020-12-20 | Discharge: 2020-12-20 | Payer: MEDICARE

## 2020-12-20 DIAGNOSIS — I519 Heart disease, unspecified: Secondary | ICD-10-CM

## 2020-12-20 DIAGNOSIS — I5043 Acute on chronic combined systolic (congestive) and diastolic (congestive) heart failure: Secondary | ICD-10-CM

## 2020-12-20 NOTE — Telephone Encounter
Reviewed alert below and attachment in chart with Dr. Sherryll Burger. He would like patient to come in on Nov 16th and get BMP, mag  and BNP at this time.     Called and spoke to patient. He denies any symptoms at the time of the alerts. He states he was out to eat with his family around time of transmission and denies any symptoms. Communicated Dr. Margaretmary Eddy order with patient. He is agreeable to getting labs tomorrow at [facility]. Patient then states that he was feeling fatigued yesterday, but continues to deny any other symptoms. Will review labs with Dr. Sherryll Burger for further recommendations.

## 2020-12-20 NOTE — Telephone Encounter
-----   Message from Guy Franco sent at 12/20/2020 11:24 AM CDT -----  Regarding: FW: Ruben Wood: pt had 1 NS VT at 265 bpm for ~2.5 sec on 12/19/20, follow up as needed/ ICD devce orders needed for F/u due in April 2023 please. thx  Forwarding remote alert for [facility] pt; note pt also est with EP team and saw Dr Betti Cruz 10/27/20- note not yet dictated.    ----- Message -----  From: Sonia Baller, RN  Sent: 12/20/2020  11:19 AM CDT  To: Cvm Nurse Hf Team Silver  Subject: Ruben Wood: pt had 1 NS VT at 265 bpm for ~2.5 s#    Ruben Wood: remote alert: pt had 1 NS VT at 265 bpm for ~2.5 sec on 12/19/20, follow up as needed/ ICD devce orders needed for F/u due in April 2023 please. thx

## 2020-12-21 ENCOUNTER — Ambulatory Visit: Admit: 2020-12-21 | Discharge: 2020-12-21 | Payer: MEDICARE

## 2020-12-21 ENCOUNTER — Encounter: Admit: 2020-12-21 | Discharge: 2020-12-21 | Payer: MEDICARE

## 2020-12-21 DIAGNOSIS — I509 Heart failure, unspecified: Secondary | ICD-10-CM

## 2020-12-21 DIAGNOSIS — J479 Bronchiectasis, uncomplicated: Secondary | ICD-10-CM

## 2020-12-21 DIAGNOSIS — Z95828 Presence of other vascular implants and grafts: Secondary | ICD-10-CM

## 2020-12-21 DIAGNOSIS — Z95810 Presence of automatic (implantable) cardiac defibrillator: Secondary | ICD-10-CM

## 2020-12-21 DIAGNOSIS — I519 Heart disease, unspecified: Secondary | ICD-10-CM

## 2020-12-21 DIAGNOSIS — M5416 Radiculopathy, lumbar region: Secondary | ICD-10-CM

## 2020-12-21 DIAGNOSIS — R0600 Dyspnea, unspecified: Secondary | ICD-10-CM

## 2020-12-21 DIAGNOSIS — E782 Mixed hyperlipidemia: Secondary | ICD-10-CM

## 2020-12-21 DIAGNOSIS — I429 Cardiomyopathy, unspecified: Secondary | ICD-10-CM

## 2020-12-21 DIAGNOSIS — K219 Gastro-esophageal reflux disease without esophagitis: Secondary | ICD-10-CM

## 2020-12-21 DIAGNOSIS — Z8614 Personal history of Methicillin resistant Staphylococcus aureus infection: Secondary | ICD-10-CM

## 2020-12-21 DIAGNOSIS — D509 Iron deficiency anemia, unspecified: Secondary | ICD-10-CM

## 2020-12-21 DIAGNOSIS — M961 Postlaminectomy syndrome, not elsewhere classified: Secondary | ICD-10-CM

## 2020-12-21 DIAGNOSIS — J302 Other seasonal allergic rhinitis: Secondary | ICD-10-CM

## 2020-12-21 DIAGNOSIS — I714 AAA (abdominal aortic aneurysm): Secondary | ICD-10-CM

## 2020-12-21 DIAGNOSIS — Z9981 Dependence on supplemental oxygen: Secondary | ICD-10-CM

## 2020-12-21 DIAGNOSIS — Z8679 Personal history of other diseases of the circulatory system: Secondary | ICD-10-CM

## 2020-12-21 DIAGNOSIS — J189 Pneumonia, unspecified organism: Secondary | ICD-10-CM

## 2020-12-21 DIAGNOSIS — I2510 Atherosclerotic heart disease of native coronary artery without angina pectoris: Secondary | ICD-10-CM

## 2020-12-21 DIAGNOSIS — M954 Acquired deformity of chest and rib: Secondary | ICD-10-CM

## 2020-12-21 DIAGNOSIS — I5042 Chronic combined systolic (congestive) and diastolic (congestive) heart failure: Secondary | ICD-10-CM

## 2020-12-21 DIAGNOSIS — I10 Essential (primary) hypertension: Secondary | ICD-10-CM

## 2020-12-21 DIAGNOSIS — N183 CKD (chronic kidney disease) stage 3, GFR 30-59 ml/min (HCC): Secondary | ICD-10-CM

## 2020-12-21 DIAGNOSIS — J449 Chronic obstructive pulmonary disease, unspecified: Secondary | ICD-10-CM

## 2020-12-21 DIAGNOSIS — I4891 Unspecified atrial fibrillation: Secondary | ICD-10-CM

## 2020-12-21 DIAGNOSIS — N401 Enlarged prostate with lower urinary tract symptoms: Secondary | ICD-10-CM

## 2020-12-21 DIAGNOSIS — R609 Edema, unspecified: Secondary | ICD-10-CM

## 2020-12-21 DIAGNOSIS — G4733 Obstructive sleep apnea (adult) (pediatric): Secondary | ICD-10-CM

## 2020-12-21 DIAGNOSIS — R053 Chronic cough: Secondary | ICD-10-CM

## 2020-12-21 DIAGNOSIS — I5043 Acute on chronic combined systolic (congestive) and diastolic (congestive) heart failure: Secondary | ICD-10-CM

## 2020-12-21 DIAGNOSIS — E119 Type 2 diabetes mellitus without complications: Secondary | ICD-10-CM

## 2020-12-21 DIAGNOSIS — J45909 Unspecified asthma, uncomplicated: Secondary | ICD-10-CM

## 2020-12-21 DIAGNOSIS — U071 Pneumonia due to COVID-19 virus: Secondary | ICD-10-CM

## 2020-12-21 DIAGNOSIS — L03116 Cellulitis of left lower limb: Secondary | ICD-10-CM

## 2020-12-21 LAB — BASIC METABOLIC PANEL
ANION GAP: 9 (ref 3–12)
CALCIUM: 9.1 mg/dL (ref 8.5–10.6)
CHLORIDE: 96 MMOL/L — ABNORMAL LOW (ref 98–110)
CREATININE: 1.6 mg/dL — ABNORMAL HIGH (ref 0.4–1.24)
EGFR: 41 mL/min — ABNORMAL LOW (ref >60)
GLUCOSE,PANEL: 100 mg/dL (ref 70–100)
POTASSIUM: 4.2 MMOL/L (ref 3.5–5.1)
SODIUM: 136 MMOL/L — ABNORMAL LOW (ref 137–147)

## 2020-12-21 LAB — MAGNESIUM: MAGNESIUM: 2.2 mg/dL — ABNORMAL HIGH (ref 1.6–2.6)

## 2020-12-21 LAB — BNP (B-TYPE NATRIURETIC PEPTI): BNP: 235 pg/mL — ABNORMAL HIGH (ref 0–100)

## 2020-12-21 NOTE — Progress Notes
[facility] CLINIC NOTE       SUBJECTIVE:   Ruben Go. is a 80 y.o.-year-old male with history of ICD placement, neuropathy, and L4-S1 laminectomy who presents for follow-up of low back pain. Patient is s/p left sacroiliac joint injection on 08/12/20 and subsequent course of physical therapy which he is still attending. Reports no relief with injection. Reports persistent left-sided low back pain with radiation into the buttock and posterior thigh, with associated intermittent numbness/tingling into the left posterior thigh with prolonged sitting. Denies weakness in the lower extremities. Currently utilizes Gabapentin 400mg  BID as well as Ibuprofen, Tylenol, Tramadol PRN with occasional relief. Not often compliant with scheduled Gabapentin. Also utilizes compounded cream in bilateral feet for peripheral neuropathy.         Review of Systems: negative except that in HPI as above    Current Outpatient Medications:   ?  acetaminophen (TYLENOL) 325 mg tablet, Take two tablets by mouth every 6 hours as needed for Pain., Disp: 30 tablet, Rfl: 0  ?  albuterol 0.083% (PROVENTIL) 2.5 mg /3 mL (0.083 %) nebulizer solution, Inhale 3 mL solution by nebulizer as directed every 6 hours as needed for Wheezing or Shortness of Breath. Indications: asthma, Disp: 270 mL, Rfl: 11  ?  albuterol sulfate (PROAIR HFA) 90 mcg/actuation HFA aerosol inhaler, Inhale two puffs by mouth into the lungs every 4 hours as needed for Wheezing or Shortness of Breath., Disp: 18 g, Rfl: 11  ?  allopurinoL (ZYLOPRIM) 300 mg tablet, Take 1 tablet by mouth once daily with food, Disp: 90 tablet, Rfl: 0  ?  AMITR/GABAPEN/EMU OIL 05/24/08% CREAM (COMPOUND), Apply topically to affected area three times daily as needed., Disp: 100 g, Rfl: 3  ?  arformoteroL (BROVANA) 15 mcg/2 mL nebulizer solution, Inhale 2 mL solution by nebulizer as directed twice daily. Dx: J44.9, Disp: 120 mL, Rfl: 11  ?  ASCORBIC ACID-ASCORBATE SODIUM PO, Take 500 mg by mouth every morning., Disp: , Rfl:   ?  atorvastatin (LIPITOR) 40 mg tablet, Take 1 tablet by mouth once daily, Disp: 90 tablet, Rfl: 1  ?  azelastine (ASTELIN) 137 mcg (0.1 %) nasal spray, Apply two sprays to each nostril as directed twice daily. Use in each nostril as directed, Disp: 30 mL, Rfl: 5  ?  benzonatate (TESSALON PERLES) 100 mg capsule, Take one capsule by mouth every 8 hours., Disp: 21 capsule, Rfl: 0  ?  budesonide (PULMICORT) 0.25 mg/2 mL nebulizer solution, Inhale 2 mL solution by nebulizer as directed twice daily. Dx: J44.9, Disp: 120 mL, Rfl: 11  ?  bumetanide (BUMEX) 1 mg tablet, TAKE 2 TABLETS BY MOUTH TWICE DAILY, Disp: 450 tablet, Rfl: 3  ?  cholecalciferol (VITAMIN D-3) 1,000 units tablet, Take 1,000 Units by mouth daily., Disp: , Rfl:   ?  duloxetine DR (CYMBALTA) 60 mg capsule, Take 1 capsule by mouth once daily, Disp: 90 capsule, Rfl: 1  ?  empagliflozin (JARDIANCE) 25 mg tablet, Take one tablet by mouth daily., Disp: 90 tablet, Rfl: 3  ?  finasteride (PROSCAR) 5 mg tablet, Take one tablet by mouth daily., Disp: 90 tablet, Rfl: 3  ?  fish oil- omega 3-DHA/EPA 300/1,000 mg capsule, Take 1 capsule by mouth daily., Disp: , Rfl:   ?  flash glucose scanning reader (FREESTYLE LIBRE) reader, Use as directed., Disp: 1 each, Rfl: 0  ?  flash glucose sensor (FREESTYLE LIBRE 14 DAY SENSOR) sensor, Type 2 diabetes  Indications: type 2 diabetes  mellitus, Disp: 1 each, Rfl: 0  ?  gabapentin (NEURONTIN) 100 mg capsule, TAKE 4 CAPSULES BY MOUTH TWICE DAILY, Disp: 90 capsule, Rfl: 0  ?  insulin pen needles (disposable) (BD ULTRA-FINE MINI PEN NEEDLE) 31 gauge x 3/16 pen needle, Use with insulin pens, Disp: 300 each, Rfl: 3  ?  ipratropium bromide (ATROVENT) 21 mcg (0.03 %) nasal spray, Apply two sprays to each nostril as directed every 12 hours., Disp: 30 mL, Rfl: 3  ?  levoFLOXacin (LEVAQUIN) 750 mg tablet, Take one tablet by mouth daily., Disp: 7 tablet, Rfl: 0  ?  magnesium oxide 400 mg magnesium capsule, Take one capsule by mouth twice daily., Disp: 60 capsule, Rfl: 11  ?  metOLazone (ZAROXOLYN) 2.5 mg tablet, Take one tablet by mouth daily as needed. PER OUTPATIENT CARDIOLOGY PROVIDER AS NEEDED FOR FLUID RETENTION, Disp: 90 tablet, Rfl: 0  ?  Miscellaneous Medical Supply misc, itted for LE gradual compression stockings at a medical supply store, Disp: 1 each, Rfl: 1  ?  Miscellaneous Medical Supply misc, Vancomycin 1,500 mg IV every 24hours starting 06/25/20 x 4 days (14 total antibiotic days), please draw vancomycin level per your protocol at [facility]., Disp: 1 each, Rfl: 0  ?  Miscellaneous Medical Supply misc, Rx: Please wrap legs with ACE bandage from toes as high up to the leg as possible bilaterally Dx: Lymphedema, Disp: 2 each, Rfl: 0  ?  montelukast (SINGULAIR) 10 mg tablet, Take one tablet by mouth at bedtime daily., Disp: 90 tablet, Rfl: 2  ?  nitroglycerin (NITROSTAT) 0.4 mg tablet, Place 0.4 mg under tongue every 5 minutes as needed for Chest Pain. Max of 3 tablets, call 911., Disp: , Rfl:   ?  nystatin (MYCOSTATIN) 100,000 unit/g topical cream, Apply  topically to affected area twice daily., Disp: 30 g, Rfl: 3  ?  nystatin (NYSTOP) 100,000 unit/g topical powder, Apply  topically to affected area four times daily., Disp: 60 g, Rfl: 3  ?  pantoprazole DR (PROTONIX) 40 mg tablet, Take one tablet by mouth twice daily., Disp: 180 tablet, Rfl: 3  ?  polyethylene glycol 3350 (MIRALAX) 17 g packet, Take two packets by mouth twice daily., Disp: 12 each, Rfl: 3  ?  rivaroxaban (XARELTO) 20 mg tablet, Take one tablet by mouth daily with dinner. Take with food., Disp: 90 tablet, Rfl: 3  ?  semaglutide (OZEMPIC) 0.25 mg or 0.5 mg(2 mg/1.5 mL) injection PEN, Inject one mg under the skin every 7 days., Disp: 2 mL, Rfl: 3  ?  sodium chloride (HYPER-SAL) 3.5 % inhalation solution, Inhale 4 mL by mouth into the lungs twice daily. 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  spironolactone (ALDACTONE) 25 mg tablet, Take one tablet by mouth twice daily. Take with food., Disp: 180 tablet, Rfl: 3  ?  tamsulosin (FLOMAX) 0.4 mg capsule, Take one capsule by mouth daily. Do not crush, chew or open capsules. Take 30 minutes following the same meal each day., Disp: 90 capsule, Rfl: 3  ?  traMADoL (ULTRAM) 50 mg tablet, Take one tablet by mouth every 8 hours as needed for Pain., Disp: 30 tablet, Rfl: 0  ?  zinc sulfate 220 mg (50 mg elemental zinc) capsule, Take 220 mg by mouth daily., Disp: , Rfl:      Allergies   Allergen Reactions   ? Chlorhexidine BLISTERS and EDEMA     Physical Exam  Vitals:    12/21/20 1100  BP: 133/87   BP Source: Arm, Right Upper   Pulse: 93   Temp: 36.9 ?C (98.4 ?F)   SpO2: 98%   Weight: 124 kg (273 lb 6.4 oz)   Height: 180.3 cm (5' 11)     Oswestry Total Score:: 28     Body mass index is 38.13 kg/m?[name]  General: 80 y.o. male appears stated age, in no acute distress  HEENT: Normocephalic, atraumatic  Neck: No thyroidmegaly  Cardiovascular: Well perfused  Pulmonary: Unlabored respirations  Extremities: No cyanosis, clubbing, or edema  Skin: Warm and dry  Psychiatric:  Appropriate mood and affect  Musculoskeletal: Poor standing tolerance. Tender to palpation at L5-S1 facets and gluteal musculature. Modified FABER is negative on the left.   Neurologic: Lower extremity strength 3/5 in bilateral EHLs; otherwise, lower extremity myotomes 5/5. Decreased sensation stocking like distribution bilaterally.  Seated slump testing positive on the left.        IMPRESSION:  1. Lumbar radiculopathy    2. Lumbar post-laminectomy syndrome        PLAN:    1.  Lifestyle modifications.  Recommend activity as tolerated.  Avoid provocative maneuvers.  Keep spine in neutral position.  2.  Medications.  No changes indicated at this time.  Continue medications as previously prescribed.  3.  Therapy.  Continue physical therapy.  4.  Imaging.  Obtain L-Spine MRI for further evaluation of nerve root impingement.  5.  Interventions. Consider epidural steroid injection pending results of L-Spine MRI.  6.  Follow-up.  Patient to follow-up after MRI.    Patient was seen, examined, and discussed with attending physician, Dr. Katrinka Blazing, who agrees with assessment and plan.    Aurelio Brash MD  Interventional Spine & MS[facility] Medicine Fellow  PM&R    ATTESTATION    I personally performed the key portions of the E/M visit, discussed case with fellow and concur with fellow documentation of history, physical exam, assessment, and treatment plan unless otherwise noted.    Ruben Wood is an 80 year old male with history of ICD placement, neuropathy, and L4-S1 laminectomy who presents for follow-up on low back pain.  Last visit we recommended sacroiliac joint injection.  This was performed and he denies any significant relief.  He continues have pain in the back with radiation into the leg.  This is worse with prolonged sitting.  He denies overt weakness.  He is currently utilizing gabapentin, ibuprofen, acetaminophen, and tramadol as needed.  He is undergone physical therapy.  He denies any significant benefit.    On examination he has pain at L5-S1 facet joints.  He has poor standing tolerance.  Proximal strength is 3/5 bilateral EHL.  No upper motor neuron signs.    Radiographs of lumbar spine from 11/06/2019 was personally reviewed demonstrate L4-L5 S1 laminectomy.  There is grade 1 anterolisthesis of L4 on L5 and L5 and S1.  There is disc base narrowing most severely at L4-L5 and L5-S1 and to lesser degree at L3-L4 and L4-L5.  There is multilevel lumbar facet arthropathy present.    Ruben Wood is a 80 year old male who presents with persistent radicular low back pain status post lumbar laminectomy.  I would recommend MRI of lumbar spine for further evaluation.  May consider injection pending imaging.  We will see him back after imaging.    Staff name: Curly Rim, MD  Date: 12/21/20

## 2020-12-21 NOTE — Progress Notes
Conditional Device 1.5T     MRI on 01/12/21    Device check at 11:30  MRI at 12:30    CXR in chart      Name: Ruben Wood      Exam: MRI L SPINE WO CON      Ordering physician: Aurelio Brash      Expected date or time frame:     Paste device information below:     GENERATOR ATRIAL LEAD RV LEAD LV LEAD   Manufacturer: St. Jude  St. Jude  St. Jude  Alto. Jude    Model Number: [id number] Gallant(TM) HF Tendril MRI LPA1200M/52cm Durata 7122Q/58cm Quartet 1458QL/86cm   Serial Number: [id number]   Implant Date: [id number] 11/25/2015 11/25/2015 11/25/2015   Device Type: CRT-D

## 2020-12-22 ENCOUNTER — Encounter: Admit: 2020-12-22 | Discharge: 2020-12-22 | Payer: MEDICARE

## 2020-12-22 NOTE — Telephone Encounter
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

## 2020-12-23 ENCOUNTER — Encounter: Admit: 2020-12-23 | Discharge: 2020-12-23 | Payer: MEDICARE

## 2020-12-23 MED ORDER — JARDIANCE 25 MG PO TAB
ORAL_TABLET | Freq: Every day | 0 refills | Status: AC
Start: 2020-12-23 — End: ?

## 2020-12-24 ENCOUNTER — Encounter: Admit: 2020-12-24 | Discharge: 2020-12-24 | Payer: MEDICARE

## 2020-12-24 MED FILL — SODIUM CHLORIDE 3.5 % IN NEBU: 3.5 % | RESPIRATORY_TRACT | 30 days supply | Qty: 240 | Fill #5 | Status: AC

## 2020-12-24 MED FILL — ALBUTEROL SULFATE 2.5 MG /3 ML (0.083 %) IN NEBU: 2.5 mg /3 mL (0.083 %) | RESPIRATORY_TRACT | 19 days supply | Qty: 225 | Fill #7 | Status: AC

## 2020-12-26 ENCOUNTER — Encounter: Admit: 2020-12-26 | Discharge: 2020-12-26 | Payer: MEDICARE

## 2020-12-26 DIAGNOSIS — I2510 Atherosclerotic heart disease of native coronary artery without angina pectoris: Secondary | ICD-10-CM

## 2020-12-26 DIAGNOSIS — E782 Mixed hyperlipidemia: Secondary | ICD-10-CM

## 2020-12-26 DIAGNOSIS — J449 Chronic obstructive pulmonary disease, unspecified: Secondary | ICD-10-CM

## 2020-12-26 DIAGNOSIS — N183 CKD (chronic kidney disease) stage 3, GFR 30-59 ml/min (HCC): Secondary | ICD-10-CM

## 2020-12-26 DIAGNOSIS — M961 Postlaminectomy syndrome, not elsewhere classified: Secondary | ICD-10-CM

## 2020-12-26 DIAGNOSIS — G4733 Obstructive sleep apnea (adult) (pediatric): Secondary | ICD-10-CM

## 2020-12-26 DIAGNOSIS — I509 Heart failure, unspecified: Secondary | ICD-10-CM

## 2020-12-26 DIAGNOSIS — Z9981 Dependence on supplemental oxygen: Secondary | ICD-10-CM

## 2020-12-26 DIAGNOSIS — M954 Acquired deformity of chest and rib: Secondary | ICD-10-CM

## 2020-12-26 DIAGNOSIS — J189 Pneumonia, unspecified organism: Secondary | ICD-10-CM

## 2020-12-26 DIAGNOSIS — I4891 Unspecified atrial fibrillation: Secondary | ICD-10-CM

## 2020-12-26 DIAGNOSIS — Z95828 Presence of other vascular implants and grafts: Secondary | ICD-10-CM

## 2020-12-26 DIAGNOSIS — J302 Other seasonal allergic rhinitis: Secondary | ICD-10-CM

## 2020-12-26 DIAGNOSIS — D509 Iron deficiency anemia, unspecified: Secondary | ICD-10-CM

## 2020-12-26 DIAGNOSIS — K219 Gastro-esophageal reflux disease without esophagitis: Secondary | ICD-10-CM

## 2020-12-26 DIAGNOSIS — R0600 Dyspnea, unspecified: Secondary | ICD-10-CM

## 2020-12-26 DIAGNOSIS — I10 Essential (primary) hypertension: Secondary | ICD-10-CM

## 2020-12-26 DIAGNOSIS — I429 Cardiomyopathy, unspecified: Secondary | ICD-10-CM

## 2020-12-26 DIAGNOSIS — J479 Bronchiectasis, uncomplicated: Secondary | ICD-10-CM

## 2020-12-26 DIAGNOSIS — R609 Edema, unspecified: Secondary | ICD-10-CM

## 2020-12-26 DIAGNOSIS — R053 Chronic cough: Secondary | ICD-10-CM

## 2020-12-26 DIAGNOSIS — Z8679 Personal history of other diseases of the circulatory system: Secondary | ICD-10-CM

## 2020-12-26 DIAGNOSIS — E119 Type 2 diabetes mellitus without complications: Secondary | ICD-10-CM

## 2020-12-26 DIAGNOSIS — I5042 Chronic combined systolic (congestive) and diastolic (congestive) heart failure: Secondary | ICD-10-CM

## 2020-12-26 DIAGNOSIS — U071 Pneumonia due to COVID-19 virus: Secondary | ICD-10-CM

## 2020-12-26 DIAGNOSIS — Z8614 Personal history of Methicillin resistant Staphylococcus aureus infection: Secondary | ICD-10-CM

## 2020-12-26 DIAGNOSIS — J45909 Unspecified asthma, uncomplicated: Secondary | ICD-10-CM

## 2020-12-26 DIAGNOSIS — N401 Enlarged prostate with lower urinary tract symptoms: Secondary | ICD-10-CM

## 2020-12-26 DIAGNOSIS — I714 AAA (abdominal aortic aneurysm): Secondary | ICD-10-CM

## 2020-12-26 DIAGNOSIS — L03116 Cellulitis of left lower limb: Secondary | ICD-10-CM

## 2020-12-27 ENCOUNTER — Encounter: Admit: 2020-12-27 | Discharge: 2020-12-27 | Payer: MEDICARE

## 2020-12-28 ENCOUNTER — Encounter: Admit: 2020-12-28 | Discharge: 2020-12-28 | Payer: MEDICARE

## 2020-12-28 MED FILL — BUDESONIDE 0.25 MG/2 ML IN NBSP: 0.25 mg/2 mL | RESPIRATORY_TRACT | 30 days supply | Qty: 120 | Fill #7 | Status: AC

## 2020-12-28 MED FILL — ARFORMOTEROL 15 MCG/2 ML IN NEBU: 15 mcg/2 mL | RESPIRATORY_TRACT | 30 days supply | Qty: 120 | Fill #7 | Status: AC

## 2020-12-29 ENCOUNTER — Encounter: Admit: 2020-12-29 | Discharge: 2020-12-29 | Payer: MEDICARE

## 2020-12-29 NOTE — Telephone Encounter
-----   Message from Gordy Savers sent at 12/27/2020  7:45 AM CST -----  Regarding: YMR: Remote alert for NSVT episodes.  A Yellow Alert was reported by the device: Another alert for NSVT episodes. Alerts received 12/25/20 and 12/26/20. Seven new NSVT episodes since last remote alert 12/24/20. Longest 10 seconds. Rates 146 to 256 bpm. Egms showed NSVT for all seven episodes.    Additional Notes:  Normal device function. No CorVue measurements available. Current rhythm AS-BVP 80 bpm. One AT episode, lasted 12 seconds. Egms showed AT.

## 2020-12-29 NOTE — Telephone Encounter
Notified YMR twice to review. Waiting on recommendations at this time.

## 2020-12-30 ENCOUNTER — Encounter: Admit: 2020-12-30 | Discharge: 2020-12-30 | Payer: MEDICARE

## 2020-12-30 DIAGNOSIS — J470 Bronchiectasis with acute lower respiratory infection: Secondary | ICD-10-CM

## 2020-12-30 DIAGNOSIS — J438 Other emphysema: Secondary | ICD-10-CM

## 2020-12-30 MED ORDER — LEVOFLOXACIN 750 MG PO TAB
750 mg | ORAL_TABLET | Freq: Every day | ORAL | 0 refills | 7.00000 days | Status: AC
Start: 2020-12-30 — End: ?

## 2020-12-30 MED ORDER — PREDNISONE 20 MG PO TAB
40 mg | ORAL_TABLET | Freq: Every day | ORAL | 0 refills | Status: AC
Start: 2020-12-30 — End: ?

## 2020-12-30 NOTE — Telephone Encounter
RE'cd Order via Voalte: Levaquin 750mg  daily x 7 days, prednisone 40mg  daily x 5 days.

## 2021-01-04 ENCOUNTER — Encounter: Admit: 2021-01-04 | Discharge: 2021-01-04 | Payer: MEDICARE

## 2021-01-06 ENCOUNTER — Encounter: Admit: 2021-01-06 | Discharge: 2021-01-06 | Payer: MEDICARE

## 2021-01-06 NOTE — Telephone Encounter
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 2 episodes of treated VT. Both 01/05/2021. First episode at 1:55PM and last at 2:30PM that day. Both 14 seconds of VT treated and terminated by ATP. Both episodes 240bpm. Please see attached. Thank you.

## 2021-01-06 NOTE — Telephone Encounter
Called patient regarding episode yesterday, the patient was driving on his way back from being out of town.     He does not recall any episodes where he felt side effects and denies chest palpitations, shortness of breath (worstened, the patient reports he has this but it is chronic), chest tightness, etc. He confirms appt. With Dr Sherryll Burger on 12/08    Reviewed plan with the patient. Patient verbalized understanding and does not have any further questions or concerns.   No further education requested from patient. Patient has our contact information for future needs.

## 2021-01-08 ENCOUNTER — Encounter: Admit: 2021-01-08 | Discharge: 2021-01-08 | Payer: MEDICARE

## 2021-01-10 ENCOUNTER — Encounter: Admit: 2021-01-10 | Discharge: 2021-01-10 | Payer: MEDICARE

## 2021-01-10 NOTE — Telephone Encounter
Spoke to wife. She is at grocery store and she doesn't think pt would answer his phone at home. She reports pt is sleeping a lot and not feeling well often. On Saturday he slept almost all day. Recently had prednisone and levaquin for "bad lungs" She reports he does have dizzy spells/presyncopal episodes on occasion. SOA no worse than status quo. Uses CPAP most of the time when sleeping. Sleeps in recliner. Doesn't routinely check BP at home.   Routed and text sent to [facility].

## 2021-01-10 NOTE — Telephone Encounter
-----   Message from [facility], RN sent at 01/10/2021 10:22 AM CST -----  Regarding: YMR: noted increase in VT events. 2 treated VT w ATP (since 12/06/20) 24 NS VT & 5 SVT  YMR: noted increase in VT events on remote alert. 2 treated VT w ATP (since 12/06/20) 24 NS VT & 5 SVT   VT rates 240bpm, NS VT 165-265 bpm. Pls f/u w. YMR noted to possibly start amio?

## 2021-01-11 ENCOUNTER — Encounter: Admit: 2021-01-11 | Discharge: 2021-01-11 | Payer: MEDICARE

## 2021-01-11 DIAGNOSIS — I5043 Acute on chronic combined systolic (congestive) and diastolic (congestive) heart failure: Secondary | ICD-10-CM

## 2021-01-11 DIAGNOSIS — J449 Chronic obstructive pulmonary disease, unspecified: Secondary | ICD-10-CM

## 2021-01-11 NOTE — Telephone Encounter
Remote Patient Monitoring Enrollment      You have been identified as a candidate for the Remote Patient Monitoring program. You will be provided with a kit to monitor your pulse o2.      Do you verbally consent to participate in the program? No - will discuss with wife and call back with decision.

## 2021-01-11 NOTE — Telephone Encounter
Remote Patient Monitoring Enrollment      You have been identified as a candidate for the Remote Patient Monitoring program. You will be provided with a kit to monitor your pulse o2.      Do you verbally consent to participate in the program? Yes - voice mail received with consent to join program.

## 2021-01-11 NOTE — Progress Notes
Patient now enrolled in RPM program  Patient is already enrolled in CCM program  Notified Rubie Maid, CM/RN, above the above

## 2021-01-12 ENCOUNTER — Ambulatory Visit: Admit: 2021-01-12 | Discharge: 2021-01-12 | Payer: MEDICARE

## 2021-01-12 ENCOUNTER — Encounter: Admit: 2021-01-12 | Discharge: 2021-01-12 | Payer: MEDICARE

## 2021-01-12 DIAGNOSIS — Z95810 Presence of automatic (implantable) cardiac defibrillator: Secondary | ICD-10-CM

## 2021-01-12 DIAGNOSIS — M5416 Radiculopathy, lumbar region: Secondary | ICD-10-CM

## 2021-01-12 NOTE — Progress Notes
Procedure: MRI L-spine no contrast    Pacemaker Type: Conditional    Order verified: Yes    Allergies reviewed: Yes    Patient history reviewed: Yes      Conditional Pacemaker: Minimum requirements, vital signs needed pre/post scan (See Doc Flowsheets for further).    1148 Pt placed on MRI safe cardiac, BP and O2 monitors.   ?   1149 Pre procedure Vital Signs (See Doc Flowsheets for further).    1210 MRI Device Settings placed by Ludger Nutting EP, RN and Kalman Drape EP RN.    Program Mode: ODO    Pacing Mode: Off    1325 Device returned back to prior MRI device settings post-scan by Kalman Drape EP RN.    Post procedure Vital Signs (See Doc Flowsheets for further).

## 2021-01-15 ENCOUNTER — Encounter: Admit: 2021-01-15 | Discharge: 2021-01-15 | Payer: MEDICARE

## 2021-01-17 ENCOUNTER — Encounter: Admit: 2021-01-17 | Discharge: 2021-01-17 | Payer: MEDICARE

## 2021-01-17 DIAGNOSIS — Z95810 Presence of automatic (implantable) cardiac defibrillator: Secondary | ICD-10-CM

## 2021-01-17 DIAGNOSIS — J470 Bronchiectasis with acute lower respiratory infection: Secondary | ICD-10-CM

## 2021-01-17 DIAGNOSIS — J438 Other emphysema: Secondary | ICD-10-CM

## 2021-01-17 MED ORDER — PREDNISONE 20 MG PO TAB
40 mg | ORAL_TABLET | Freq: Every day | ORAL | 0 refills | Status: AC
Start: 2021-01-17 — End: ?

## 2021-01-17 MED ORDER — LEVOFLOXACIN 750 MG PO TAB
750 mg | ORAL_TABLET | Freq: Every day | ORAL | 0 refills | 7.00000 days | Status: AC
Start: 2021-01-17 — End: ?

## 2021-01-17 NOTE — Telephone Encounter
Order's rec'd via Voalte: Prednisone 40mg  x 5 days and levaquin 750 x 7 days.

## 2021-01-18 ENCOUNTER — Encounter: Admit: 2021-01-18 | Discharge: 2021-01-18 | Payer: MEDICARE

## 2021-01-18 MED ORDER — BENZONATATE 100 MG PO CAP
100 mg | ORAL_CAPSULE | ORAL | 0 refills | 9.00000 days | Status: AC
Start: 2021-01-18 — End: ?

## 2021-01-18 NOTE — Telephone Encounter
Some refill protocol elements NOT Met  Medication name: tessalon perls  Medication Strength: 100 mg    Last Fill Date: 01/20/20    Non-delegated medication    Routed to Provider

## 2021-01-24 ENCOUNTER — Encounter: Admit: 2021-01-24 | Discharge: 2021-01-24 | Payer: MEDICARE

## 2021-01-25 ENCOUNTER — Encounter: Admit: 2021-01-25 | Discharge: 2021-01-25 | Payer: MEDICARE

## 2021-01-25 NOTE — Progress Notes
Remote Patient Monitoring Welcome Call      Introduced self and verified correct patient.      My reason for calling today is to confirm that you have received your remote patient   monitoring equipment and have been able to successfully turn on the device, sign the consent to   access the home screen? Yes    Have you initiated the daily measurements? Yes  Do you have any questions about how to use the remote patient monitoring equipment? Yes - frustrated with equipment. After review, care plan updated to reflect need to only check pulse ox for COPD, pt and caregiver much happier with this plan  Are there any other questions I can help answer for you today? No    A member of our remote patient monitoring program will be following up with you periodically. Contact information provided to patient.    Visit date not found       Time Spent (minutes): 10

## 2021-01-26 ENCOUNTER — Encounter: Admit: 2021-01-26 | Discharge: 2021-01-26 | Payer: MEDICARE

## 2021-01-26 MED FILL — ARFORMOTEROL 15 MCG/2 ML IN NEBU: 15 mcg/2 mL | RESPIRATORY_TRACT | 30 days supply | Qty: 120 | Fill #8 | Status: CP

## 2021-01-26 NOTE — Progress Notes
Remote Patient Monitoring Risk Alert  I received a reading from you today I was concerned about.   Are you having any:  Shortness of air?No  Chest Pain? No  Edema? No  Dizziness? No  Have you taken all your medications today?Yes   Are you able to repeat the reading while I am on the phone with you? No - was doing breathing treatment and percussion vest while checking pulse ox. Feeling fine, out shopping today.   Time Spent (minutes): 5

## 2021-01-27 ENCOUNTER — Encounter: Admit: 2021-01-27 | Discharge: 2021-01-27 | Payer: MEDICARE

## 2021-01-27 ENCOUNTER — Ambulatory Visit: Admit: 2021-01-27 | Discharge: 2021-01-27 | Payer: MEDICARE

## 2021-01-27 DIAGNOSIS — Z9981 Dependence on supplemental oxygen: Secondary | ICD-10-CM

## 2021-01-27 DIAGNOSIS — J189 Pneumonia, unspecified organism: Secondary | ICD-10-CM

## 2021-01-27 DIAGNOSIS — N183 CKD (chronic kidney disease) stage 3, GFR 30-59 ml/min (HCC): Secondary | ICD-10-CM

## 2021-01-27 DIAGNOSIS — J449 Chronic obstructive pulmonary disease, unspecified: Secondary | ICD-10-CM

## 2021-01-27 DIAGNOSIS — U071 Pneumonia due to COVID-19 virus: Secondary | ICD-10-CM

## 2021-01-27 DIAGNOSIS — G4733 Obstructive sleep apnea (adult) (pediatric): Secondary | ICD-10-CM

## 2021-01-27 DIAGNOSIS — E119 Type 2 diabetes mellitus without complications: Secondary | ICD-10-CM

## 2021-01-27 DIAGNOSIS — I2510 Atherosclerotic heart disease of native coronary artery without angina pectoris: Secondary | ICD-10-CM

## 2021-01-27 DIAGNOSIS — M954 Acquired deformity of chest and rib: Secondary | ICD-10-CM

## 2021-01-27 DIAGNOSIS — D509 Iron deficiency anemia, unspecified: Secondary | ICD-10-CM

## 2021-01-27 DIAGNOSIS — K219 Gastro-esophageal reflux disease without esophagitis: Secondary | ICD-10-CM

## 2021-01-27 DIAGNOSIS — Z8679 Personal history of other diseases of the circulatory system: Secondary | ICD-10-CM

## 2021-01-27 DIAGNOSIS — Z95810 Presence of automatic (implantable) cardiac defibrillator: Secondary | ICD-10-CM

## 2021-01-27 DIAGNOSIS — I5043 Acute on chronic combined systolic (congestive) and diastolic (congestive) heart failure: Secondary | ICD-10-CM

## 2021-01-27 DIAGNOSIS — J302 Other seasonal allergic rhinitis: Secondary | ICD-10-CM

## 2021-01-27 DIAGNOSIS — R0600 Dyspnea, unspecified: Secondary | ICD-10-CM

## 2021-01-27 DIAGNOSIS — Z95828 Presence of other vascular implants and grafts: Secondary | ICD-10-CM

## 2021-01-27 DIAGNOSIS — I255 Ischemic cardiomyopathy: Secondary | ICD-10-CM

## 2021-01-27 DIAGNOSIS — L03116 Cellulitis of left lower limb: Secondary | ICD-10-CM

## 2021-01-27 DIAGNOSIS — I429 Cardiomyopathy, unspecified: Secondary | ICD-10-CM

## 2021-01-27 DIAGNOSIS — I480 Paroxysmal atrial fibrillation: Secondary | ICD-10-CM

## 2021-01-27 DIAGNOSIS — I5042 Chronic combined systolic (congestive) and diastolic (congestive) heart failure: Secondary | ICD-10-CM

## 2021-01-27 DIAGNOSIS — Z8614 Personal history of Methicillin resistant Staphylococcus aureus infection: Secondary | ICD-10-CM

## 2021-01-27 DIAGNOSIS — E782 Mixed hyperlipidemia: Secondary | ICD-10-CM

## 2021-01-27 DIAGNOSIS — I509 Heart failure, unspecified: Secondary | ICD-10-CM

## 2021-01-27 DIAGNOSIS — J479 Bronchiectasis, uncomplicated: Secondary | ICD-10-CM

## 2021-01-27 DIAGNOSIS — J45909 Unspecified asthma, uncomplicated: Secondary | ICD-10-CM

## 2021-01-27 DIAGNOSIS — R0989 Other specified symptoms and signs involving the circulatory and respiratory systems: Secondary | ICD-10-CM

## 2021-01-27 DIAGNOSIS — N401 Enlarged prostate with lower urinary tract symptoms: Secondary | ICD-10-CM

## 2021-01-27 DIAGNOSIS — I4891 Unspecified atrial fibrillation: Secondary | ICD-10-CM

## 2021-01-27 DIAGNOSIS — J438 Other emphysema: Secondary | ICD-10-CM

## 2021-01-27 DIAGNOSIS — I714 AAA (abdominal aortic aneurysm): Secondary | ICD-10-CM

## 2021-01-27 DIAGNOSIS — I10 Essential (primary) hypertension: Secondary | ICD-10-CM

## 2021-01-27 DIAGNOSIS — R053 Chronic cough: Secondary | ICD-10-CM

## 2021-01-27 DIAGNOSIS — J470 Bronchiectasis with acute lower respiratory infection: Secondary | ICD-10-CM

## 2021-01-27 DIAGNOSIS — R609 Edema, unspecified: Secondary | ICD-10-CM

## 2021-01-27 DIAGNOSIS — M961 Postlaminectomy syndrome, not elsewhere classified: Secondary | ICD-10-CM

## 2021-01-27 MED ORDER — LEVOFLOXACIN 750 MG PO TAB
750 mg | ORAL_TABLET | Freq: Every day | ORAL | 3 refills | 7.00000 days | Status: AC
Start: 2021-01-27 — End: ?

## 2021-01-27 MED ORDER — PREDNISONE 20 MG PO TAB
40 mg | ORAL_TABLET | Freq: Every day | ORAL | 3 refills | Status: AC
Start: 2021-01-27 — End: ?

## 2021-01-27 MED ORDER — BUMETANIDE 1 MG PO TAB
ORAL_TABLET | Freq: Two times a day (BID) | 3 refills | Status: AC
Start: 2021-01-27 — End: ?

## 2021-01-27 MED ORDER — MOUNJARO 5 MG/0.5 ML SC PNIJ
5 mg | SUBCUTANEOUS | 1 refills | Status: AC
Start: 2021-01-27 — End: ?
  Filled 2021-01-28: qty 2, 28d supply, fill #1

## 2021-01-27 MED FILL — BUDESONIDE 0.25 MG/2 ML IN NBSP: 0.25 mg/2 mL | RESPIRATORY_TRACT | 30 days supply | Qty: 120 | Fill #8 | Status: AC

## 2021-01-27 MED FILL — ALBUTEROL SULFATE 2.5 MG /3 ML (0.083 %) IN NEBU: 2.5 mg /3 mL (0.083 %) | RESPIRATORY_TRACT | 23 days supply | Qty: 270 | Fill #8 | Status: CP

## 2021-01-27 NOTE — Progress Notes
Daily CardioMEMS Readings    Goal PA Diastolic: 14-15 mmHg   PA Diastolic Thresholds: 12-17 mmHg  Notified Dr. Vanetta Shawl

## 2021-01-28 ENCOUNTER — Encounter: Admit: 2021-01-28 | Discharge: 2021-01-28 | Payer: MEDICARE

## 2021-01-28 MED FILL — SODIUM CHLORIDE 3.5 % IN NEBU: 3.5 % | RESPIRATORY_TRACT | 30 days supply | Qty: 240 | Fill #6 | Status: AC

## 2021-01-31 ENCOUNTER — Encounter: Admit: 2021-01-31 | Discharge: 2021-01-31 | Payer: MEDICARE

## 2021-01-31 NOTE — Telephone Encounter
Primary Care Pharmacy High Cost Medication Evaluation     Ruben Wood. was referred to the primary care pharmacist due to concerns with the cost of their medication(s).   Referring provider: Vanetta Shawl    Medication(s) with cost concerns: Mounjaro   Cost of medication per patient: $52.14/month   Patient current supply and urgency of medication need: No urgent medication need  Insurance: Government  Formulary evaluation: NA    Plan:   Evaluated cost of Mounjaro 2.5MG  injection PEN - medication did not require a prior authorization and has a copay of $52.14. Suspect cost of medication may go up January, 2023 as patient is currently in their catastrophic coverage (last stage of Medicare prescription drug coverage. Copays are typically the least expensive at this time.)    Spoke with patient's spouse to discuss medication. Spouse stated this is affordable. Medication will be filled at [facility] pharmacy and shipped to the patient. Medication was not placed on automatic refill.     Patient has been scheduled to follow-up with clinic pharmacist on Mounjaro titration on 02/23/2021. Advised patient and spouse to contact [facility] or office, should they have any questions.     Ruben Wood  Primary Care Pharmacy Technician Coordinator   [phone]

## 2021-01-31 NOTE — Progress Notes
Remote Patient Monitoring Call Attempt        Attempted call to patient for follow up of RPM program, poor cell service, unable to understand.   Will attempt again later.    Time Spent (minutes): 2

## 2021-01-31 NOTE — Progress Notes
Remote Patient Monitoring Risk Alert  I received a reading from you today I was concerned about, see flowsheet below   Are you having any:  Shortness of air?No  Chest Pain? No  Edema? No  Dizziness? No  Have you taken all your medications today?Yes   Are you able to repeat the reading while I am on the phone with you? No - driving  Time Spent (minutes): 7

## 2021-02-01 ENCOUNTER — Encounter: Admit: 2021-02-01 | Discharge: 2021-02-01 | Payer: MEDICARE

## 2021-02-01 DIAGNOSIS — Z95810 Presence of automatic (implantable) cardiac defibrillator: Secondary | ICD-10-CM

## 2021-02-01 MED FILL — MOUNJARO 5 MG/0.5 ML SC PNIJ: 5 mg/0. mL | SUBCUTANEOUS | 28 days supply | Qty: 2 | Fill #1 | Status: AC

## 2021-02-01 NOTE — Telephone Encounter
Spoke to the patient and his wife. They report the patient was in the car yesterday during the NSVT episodes. He reports being very fatigued and also notice his HR increasing during that time but denies all other symptoms.    However on 12/8 he and his wife reported a heart pounding episode that mad him very fatigued and just overall not feel well. But that did not occur during the 12/12 NSVT events          Will have Alan Ripper, RN review with YMR in clinic today

## 2021-02-01 NOTE — Telephone Encounter
-----   Message from Griffith Citron, RN sent at 02/01/2021  9:36 AM CST -----  Regarding: alert for nsvt and presenting EGM shows intermittent LV non-capture  12/13 remote alert for NSVT all on 12/12 from 1243-1544 4 "NSVT" and 5 SVT lasting 22sec to 2.17min  (no EGMs for NSVT r/t storage priority conflict) all rates 120's-130bpm similar to SVT; EGMs showing ST w/1:1 A/V and high morphology match.    Presenting rhythms on 12/12 and 12/13 remote alerts again show LV non-capture (12/12 frequent and 12/13 intermittent)    Since gen change he has had lots of issues with high LV thresholds and LV non-capture(on 11/23 we changed his vector back to what he was prior to gen change r/t non-capture ongoing for unknown amount of time, on 12/8 I saw him and again noted intermittent LV non-capture and increased his adaptive LV output)     Trends show ~85-90% BiVp over the last 3-5days, please review.    Thanks,  -Kaylee/Device Team

## 2021-02-01 NOTE — Telephone Encounter
Janett Labella, RN  Meryle Ready, RN  YMR reviewed this. He needs OV and device check with YMR in January after he gets back. Need to discuss meds vs LV lead revision. He needs device check to see if it can be programmed around or not.    Spoke to the patient's wife and gave her YMR recs. Scheduled him for a device check on 12/15. Scheduled a f/u with YMR on 1/5 with device check before.

## 2021-02-03 ENCOUNTER — Ambulatory Visit: Admit: 2021-02-03 | Discharge: 2021-02-03 | Payer: MEDICARE

## 2021-02-03 ENCOUNTER — Encounter: Admit: 2021-02-03 | Discharge: 2021-02-03 | Payer: MEDICARE

## 2021-02-03 DIAGNOSIS — Z95810 Presence of automatic (implantable) cardiac defibrillator: Secondary | ICD-10-CM

## 2021-02-08 ENCOUNTER — Encounter: Admit: 2021-02-08 | Discharge: 2021-02-08 | Payer: MEDICARE

## 2021-02-08 ENCOUNTER — Ambulatory Visit: Admit: 2021-02-08 | Discharge: 2021-02-08 | Payer: MEDICARE

## 2021-02-08 DIAGNOSIS — N401 Enlarged prostate with lower urinary tract symptoms: Secondary | ICD-10-CM

## 2021-02-08 DIAGNOSIS — I509 Heart failure, unspecified: Secondary | ICD-10-CM

## 2021-02-08 DIAGNOSIS — I10 Essential (primary) hypertension: Secondary | ICD-10-CM

## 2021-02-08 DIAGNOSIS — M5416 Radiculopathy, lumbar region: Secondary | ICD-10-CM

## 2021-02-08 DIAGNOSIS — N183 CKD (chronic kidney disease) stage 3, GFR 30-59 ml/min (HCC): Secondary | ICD-10-CM

## 2021-02-08 DIAGNOSIS — I714 AAA (abdominal aortic aneurysm): Secondary | ICD-10-CM

## 2021-02-08 DIAGNOSIS — J45909 Unspecified asthma, uncomplicated: Secondary | ICD-10-CM

## 2021-02-08 DIAGNOSIS — J189 Pneumonia, unspecified organism: Secondary | ICD-10-CM

## 2021-02-08 DIAGNOSIS — E119 Type 2 diabetes mellitus without complications: Secondary | ICD-10-CM

## 2021-02-08 DIAGNOSIS — J479 Bronchiectasis, uncomplicated: Secondary | ICD-10-CM

## 2021-02-08 DIAGNOSIS — U071 Pneumonia due to COVID-19 virus: Secondary | ICD-10-CM

## 2021-02-08 DIAGNOSIS — Z8679 Personal history of other diseases of the circulatory system: Secondary | ICD-10-CM

## 2021-02-08 DIAGNOSIS — Z9981 Dependence on supplemental oxygen: Secondary | ICD-10-CM

## 2021-02-08 DIAGNOSIS — I429 Cardiomyopathy, unspecified: Secondary | ICD-10-CM

## 2021-02-08 DIAGNOSIS — R609 Edema, unspecified: Secondary | ICD-10-CM

## 2021-02-08 DIAGNOSIS — M961 Postlaminectomy syndrome, not elsewhere classified: Secondary | ICD-10-CM

## 2021-02-08 DIAGNOSIS — R0600 Dyspnea, unspecified: Secondary | ICD-10-CM

## 2021-02-08 DIAGNOSIS — J302 Other seasonal allergic rhinitis: Secondary | ICD-10-CM

## 2021-02-08 DIAGNOSIS — I5042 Chronic combined systolic (congestive) and diastolic (congestive) heart failure: Secondary | ICD-10-CM

## 2021-02-08 DIAGNOSIS — D509 Iron deficiency anemia, unspecified: Secondary | ICD-10-CM

## 2021-02-08 DIAGNOSIS — E782 Mixed hyperlipidemia: Secondary | ICD-10-CM

## 2021-02-08 DIAGNOSIS — I4891 Unspecified atrial fibrillation: Secondary | ICD-10-CM

## 2021-02-08 DIAGNOSIS — Z8614 Personal history of Methicillin resistant Staphylococcus aureus infection: Secondary | ICD-10-CM

## 2021-02-08 DIAGNOSIS — L03116 Cellulitis of left lower limb: Secondary | ICD-10-CM

## 2021-02-08 DIAGNOSIS — M954 Acquired deformity of chest and rib: Secondary | ICD-10-CM

## 2021-02-08 DIAGNOSIS — G4733 Obstructive sleep apnea (adult) (pediatric): Secondary | ICD-10-CM

## 2021-02-08 DIAGNOSIS — R053 Chronic cough: Secondary | ICD-10-CM

## 2021-02-08 DIAGNOSIS — I2510 Atherosclerotic heart disease of native coronary artery without angina pectoris: Secondary | ICD-10-CM

## 2021-02-08 DIAGNOSIS — J449 Chronic obstructive pulmonary disease, unspecified: Secondary | ICD-10-CM

## 2021-02-08 DIAGNOSIS — Z95828 Presence of other vascular implants and grafts: Secondary | ICD-10-CM

## 2021-02-08 DIAGNOSIS — K219 Gastro-esophageal reflux disease without esophagitis: Secondary | ICD-10-CM

## 2021-02-08 NOTE — Telephone Encounter
02-08-21  Previously seen by CVM.  No records requested.  tlv

## 2021-02-09 ENCOUNTER — Encounter: Admit: 2021-02-09 | Discharge: 2021-02-09 | Payer: MEDICARE

## 2021-02-09 NOTE — Progress Notes
Follow up Care Management Note  Chart and outreach notes reviewed.   Spoke with patitent's wife [name]     Wife Reports  Things are going well overall  Patient has been prescribed levaquin and prednisone by pulmonary if he should have another flare   Has not had to use these medications yet.   She will keep track and let clinic know if he is ill and starts taking.     Patient is using Mounjaro.   He did have some nausea yesterday, which they attributed to starting this medication  Has lost weight.  Has not weighed recently.   She can tell he is losing.  His respiratory vest is loose.   They are in the RPM program, but they have not been able to get the weight to transmit.   Right now they are focusing pulse and BP readings   No current S/S of CHF  He did get a new recliner and is elevating his legs when he is sitting.     Patient has been forgetting some of his medications.   [name] has not had to monitor his medication usage previously, but he has asked her to remind him.   Is having some sinus symptoms, but he is starting up his nasal spray   Has been sleeping better     Patient Goals:  Remain well, stay out of hospital     Barriers to Care:  Chronic respiratory issues, decompensates quickly  Lives extended distance from [facility]     Care Plan:  Instructed patient's wife to notify clinic if patient has respiratory infection exacerbation and starts taking the levaquin/prednisone.     Future Appointments   Date Time Provider Department Center   02/21/2021  4:00 PM [facility] REMOTE MONITORING MACREMOTEHRM CVM Procedur   02/22/2021  3:15 PM Joanie Coddington, MD [facility] SPINE   02/23/2021  3:00 PM Gardiner Ramus, PHARMD MACHFC CVM Exam   02/24/2021 11:00 AM [place] PACEMAKER 2 [facility] CVM Procedur   02/24/2021 11:30 AM Donnelly Stager, MD MACKUCL CVM Exam   03/08/2021 10:20 AM Comfort, [name], MD MPGENMED IM   03/18/2021 11:00 AM Deveron Furlong, MD QVAPULM IM   03/24/2021  4:00 PM MAC REMOTE MONITORING MACREMOTEHRM CVM Procedur   04/25/2021 4:00 PM MAC REMOTE MONITORING MACREMOTEHRM CVM Procedur   05/04/2021  1:30 PM CVM SN ECHO 1 SNECHO CVM Procedur   05/04/2021  3:30 PM Vanetta Shawl I, MD CVMSNCL CVM Exam   05/11/2021 11:30 AM Comfort, [name], MD [facility] IM   05/26/2021  4:00 PM MAC REMOTE MONITORING MACREMOTEHRM CVM Procedur   06/02/2021  1:00 PM Raelyn Mora, PA-C [facility] Urology   06/06/2021 10:00 AM Donnelly Stager, MD MACKUCL CVM Exam   06/06/2021 10:00 AM [place] PACEMAKER MACKUHRM CVM Procedur   06/27/2021  4:00 PM MAC REMOTE MONITORING MACREMOTEHRM CVM Procedur     Upcoming appointments reviewed with patient   I will attempt next outreach in approximately 2 months.    Total call time 54 minutes

## 2021-02-11 ENCOUNTER — Encounter: Admit: 2021-02-11 | Discharge: 2021-02-11 | Payer: MEDICARE

## 2021-02-11 NOTE — Progress Notes
Remote Patient Monitoring Welcome Call      Introduced self and verified correct patient.    Do you have any questions about how to use the remote patient monitoring equipment? No  Are there any other questions I can help answer for you today? No    A member of our remote patient monitoring program will be following up with you periodically. Contact information provided to patient.    Pt reports no anticipated insurance coverage change in the new year.       Time Spent (minutes): 4

## 2021-02-15 ENCOUNTER — Encounter: Admit: 2021-02-15 | Discharge: 2021-02-15 | Payer: MEDICARE

## 2021-02-15 NOTE — Progress Notes
Remote Patient Monitoring Risk Alert  I received a reading from you today I was concerned about.   Are you having any:  Shortness of air?Yes   Chest Pain? No  Edema? No  Dizziness? No  Have you taken all your medications today?Yes, and has started PRN Levaquin and prednisone  Are you able to repeat the reading while I am on the phone with you? risk alert was for symptoms reported, these are currently controlled and being treated     Time Spent (minutes): 20

## 2021-02-16 ENCOUNTER — Encounter: Admit: 2021-02-16 | Discharge: 2021-02-16 | Payer: MEDICARE

## 2021-02-16 DIAGNOSIS — R399 Unspecified symptoms and signs involving the genitourinary system: Secondary | ICD-10-CM

## 2021-02-16 MED ORDER — TAMSULOSIN 0.4 MG PO CAP
.4 mg | ORAL_CAPSULE | Freq: Every day | ORAL | 0 refills | 90.00000 days | Status: AC
Start: 2021-02-16 — End: ?

## 2021-02-18 ENCOUNTER — Encounter: Admit: 2021-02-18 | Discharge: 2021-02-18 | Payer: MEDICARE

## 2021-02-18 MED ORDER — GABAPENTIN 100 MG PO CAP
400 mg | ORAL_CAPSULE | Freq: Two times a day (BID) | ORAL | 0 refills | Status: AC
Start: 2021-02-18 — End: ?

## 2021-02-18 NOTE — Telephone Encounter
Patient's wife sent my chart message under her account asking for this patients gabapentin refill  Some refill protocol elements NOT Met  Medication name: gabapentin  Medication Strength: 100 mg    Last Fill Date: 10/06/20    Non-delegated medication    Routed to Provider

## 2021-02-21 ENCOUNTER — Encounter: Admit: 2021-02-21 | Discharge: 2021-02-21 | Payer: MEDICARE

## 2021-02-22 ENCOUNTER — Encounter: Admit: 2021-02-22 | Discharge: 2021-02-22 | Payer: MEDICARE

## 2021-02-22 MED FILL — ARFORMOTEROL 15 MCG/2 ML IN NEBU: 15 mcg/2 mL | RESPIRATORY_TRACT | 30 days supply | Qty: 120 | Fill #9 | Status: CP

## 2021-02-22 MED FILL — BUDESONIDE 0.25 MG/2 ML IN NBSP: 0.25 mg/2 mL | RESPIRATORY_TRACT | 30 days supply | Qty: 120 | Fill #9 | Status: CP

## 2021-02-22 MED FILL — SODIUM CHLORIDE 3.5 % IN NEBU: 3.5 % | RESPIRATORY_TRACT | 30 days supply | Qty: 240 | Fill #7 | Status: AC

## 2021-02-22 MED FILL — ALBUTEROL SULFATE 2.5 MG /3 ML (0.083 %) IN NEBU: 2.5 mg /3 mL (0.083 %) | RESPIRATORY_TRACT | 23 days supply | Qty: 270 | Fill #9 | Status: CP

## 2021-02-24 ENCOUNTER — Ambulatory Visit: Admit: 2021-02-24 | Discharge: 2021-02-24 | Payer: MEDICARE

## 2021-02-24 ENCOUNTER — Encounter: Admit: 2021-02-24 | Discharge: 2021-02-24 | Payer: MEDICARE

## 2021-02-24 DIAGNOSIS — Z95828 Presence of other vascular implants and grafts: Secondary | ICD-10-CM

## 2021-02-24 DIAGNOSIS — E119 Type 2 diabetes mellitus without complications: Secondary | ICD-10-CM

## 2021-02-24 DIAGNOSIS — I429 Cardiomyopathy, unspecified: Secondary | ICD-10-CM

## 2021-02-24 DIAGNOSIS — R609 Edema, unspecified: Secondary | ICD-10-CM

## 2021-02-24 DIAGNOSIS — I4891 Unspecified atrial fibrillation: Secondary | ICD-10-CM

## 2021-02-24 DIAGNOSIS — E782 Mixed hyperlipidemia: Secondary | ICD-10-CM

## 2021-02-24 DIAGNOSIS — D509 Iron deficiency anemia, unspecified: Secondary | ICD-10-CM

## 2021-02-24 DIAGNOSIS — I2510 Atherosclerotic heart disease of native coronary artery without angina pectoris: Secondary | ICD-10-CM

## 2021-02-24 DIAGNOSIS — Z9981 Dependence on supplemental oxygen: Secondary | ICD-10-CM

## 2021-02-24 DIAGNOSIS — I509 Heart failure, unspecified: Secondary | ICD-10-CM

## 2021-02-24 DIAGNOSIS — K219 Gastro-esophageal reflux disease without esophagitis: Secondary | ICD-10-CM

## 2021-02-24 DIAGNOSIS — N183 CKD (chronic kidney disease) stage 3, GFR 30-59 ml/min (HCC): Secondary | ICD-10-CM

## 2021-02-24 DIAGNOSIS — M961 Postlaminectomy syndrome, not elsewhere classified: Secondary | ICD-10-CM

## 2021-02-24 DIAGNOSIS — N401 Enlarged prostate with lower urinary tract symptoms: Secondary | ICD-10-CM

## 2021-02-24 DIAGNOSIS — L03116 Cellulitis of left lower limb: Secondary | ICD-10-CM

## 2021-02-24 DIAGNOSIS — I5042 Chronic combined systolic (congestive) and diastolic (congestive) heart failure: Secondary | ICD-10-CM

## 2021-02-24 DIAGNOSIS — G4733 Obstructive sleep apnea (adult) (pediatric): Secondary | ICD-10-CM

## 2021-02-24 DIAGNOSIS — J479 Bronchiectasis, uncomplicated: Secondary | ICD-10-CM

## 2021-02-24 DIAGNOSIS — Z95810 Presence of automatic (implantable) cardiac defibrillator: Secondary | ICD-10-CM

## 2021-02-24 DIAGNOSIS — M954 Acquired deformity of chest and rib: Secondary | ICD-10-CM

## 2021-02-24 DIAGNOSIS — J45909 Unspecified asthma, uncomplicated: Secondary | ICD-10-CM

## 2021-02-24 DIAGNOSIS — I10 Essential (primary) hypertension: Secondary | ICD-10-CM

## 2021-02-24 DIAGNOSIS — Z8614 Personal history of Methicillin resistant Staphylococcus aureus infection: Secondary | ICD-10-CM

## 2021-02-24 DIAGNOSIS — Z8679 Personal history of other diseases of the circulatory system: Secondary | ICD-10-CM

## 2021-02-24 DIAGNOSIS — J302 Other seasonal allergic rhinitis: Secondary | ICD-10-CM

## 2021-02-24 DIAGNOSIS — R0600 Dyspnea, unspecified: Secondary | ICD-10-CM

## 2021-02-24 DIAGNOSIS — R053 Chronic cough: Secondary | ICD-10-CM

## 2021-02-24 DIAGNOSIS — J189 Pneumonia, unspecified organism: Secondary | ICD-10-CM

## 2021-02-24 DIAGNOSIS — R0989 Other specified symptoms and signs involving the circulatory and respiratory systems: Secondary | ICD-10-CM

## 2021-02-24 DIAGNOSIS — U071 Pneumonia due to COVID-19 virus: Secondary | ICD-10-CM

## 2021-02-24 DIAGNOSIS — I714 AAA (abdominal aortic aneurysm): Secondary | ICD-10-CM

## 2021-02-24 DIAGNOSIS — J449 Chronic obstructive pulmonary disease, unspecified: Secondary | ICD-10-CM

## 2021-02-24 NOTE — Patient Instructions
Follow up in 6 months with a device check .     Consider signing up for My Chart.    In order to provide you the best care possible we ask that you follow up as below:    For NON-URGENT questions please contact us through your MyChart account.   For all medication refills please contact your pharmacy or send a request through MyChart.   For all questions that may need to be addressed urgently please call the nursing triage line at [phone] Monday - Friday 8-5 only. Please leave a detailed message with your name, date of birth, and reason for your call.      To schedule an appointment call [phone].     Please allow 10 business days for the results of any testing to be reviewed. Please call our office if you have not heard from a nurse within this time frame.

## 2021-02-25 ENCOUNTER — Encounter: Admit: 2021-02-25 | Discharge: 2021-02-25 | Payer: MEDICARE

## 2021-02-28 ENCOUNTER — Ambulatory Visit: Admit: 2021-02-28 | Discharge: 2021-02-28 | Payer: MEDICARE

## 2021-02-28 ENCOUNTER — Encounter: Admit: 2021-02-28 | Discharge: 2021-02-28 | Payer: MEDICARE

## 2021-02-28 DIAGNOSIS — R609 Edema, unspecified: Secondary | ICD-10-CM

## 2021-02-28 DIAGNOSIS — I714 AAA (abdominal aortic aneurysm): Secondary | ICD-10-CM

## 2021-02-28 DIAGNOSIS — J449 Chronic obstructive pulmonary disease, unspecified: Secondary | ICD-10-CM

## 2021-02-28 DIAGNOSIS — I739 Peripheral vascular disease, unspecified: Secondary | ICD-10-CM

## 2021-02-28 DIAGNOSIS — Z8614 Personal history of Methicillin resistant Staphylococcus aureus infection: Secondary | ICD-10-CM

## 2021-02-28 DIAGNOSIS — I429 Cardiomyopathy, unspecified: Secondary | ICD-10-CM

## 2021-02-28 DIAGNOSIS — E782 Mixed hyperlipidemia: Secondary | ICD-10-CM

## 2021-02-28 DIAGNOSIS — S81811A Laceration without foreign body, right lower leg, initial encounter: Principal | ICD-10-CM

## 2021-02-28 DIAGNOSIS — D509 Iron deficiency anemia, unspecified: Secondary | ICD-10-CM

## 2021-02-28 DIAGNOSIS — J189 Pneumonia, unspecified organism: Secondary | ICD-10-CM

## 2021-02-28 DIAGNOSIS — L03116 Cellulitis of left lower limb: Secondary | ICD-10-CM

## 2021-02-28 DIAGNOSIS — R0600 Dyspnea, unspecified: Secondary | ICD-10-CM

## 2021-02-28 DIAGNOSIS — M961 Postlaminectomy syndrome, not elsewhere classified: Secondary | ICD-10-CM

## 2021-02-28 DIAGNOSIS — K219 Gastro-esophageal reflux disease without esophagitis: Secondary | ICD-10-CM

## 2021-02-28 DIAGNOSIS — J45909 Unspecified asthma, uncomplicated: Secondary | ICD-10-CM

## 2021-02-28 DIAGNOSIS — E119 Type 2 diabetes mellitus without complications: Secondary | ICD-10-CM

## 2021-02-28 DIAGNOSIS — I4891 Unspecified atrial fibrillation: Secondary | ICD-10-CM

## 2021-02-28 DIAGNOSIS — U071 Pneumonia due to COVID-19 virus: Secondary | ICD-10-CM

## 2021-02-28 DIAGNOSIS — Z9981 Dependence on supplemental oxygen: Secondary | ICD-10-CM

## 2021-02-28 DIAGNOSIS — Z8679 Personal history of other diseases of the circulatory system: Secondary | ICD-10-CM

## 2021-02-28 DIAGNOSIS — I509 Heart failure, unspecified: Secondary | ICD-10-CM

## 2021-02-28 DIAGNOSIS — I5042 Chronic combined systolic (congestive) and diastolic (congestive) heart failure: Secondary | ICD-10-CM

## 2021-02-28 DIAGNOSIS — N401 Enlarged prostate with lower urinary tract symptoms: Secondary | ICD-10-CM

## 2021-02-28 DIAGNOSIS — M954 Acquired deformity of chest and rib: Secondary | ICD-10-CM

## 2021-02-28 DIAGNOSIS — I10 Essential (primary) hypertension: Secondary | ICD-10-CM

## 2021-02-28 DIAGNOSIS — R053 Chronic cough: Secondary | ICD-10-CM

## 2021-02-28 DIAGNOSIS — Z95828 Presence of other vascular implants and grafts: Secondary | ICD-10-CM

## 2021-02-28 DIAGNOSIS — I2510 Atherosclerotic heart disease of native coronary artery without angina pectoris: Secondary | ICD-10-CM

## 2021-02-28 DIAGNOSIS — G4733 Obstructive sleep apnea (adult) (pediatric): Secondary | ICD-10-CM

## 2021-02-28 DIAGNOSIS — N183 CKD (chronic kidney disease) stage 3, GFR 30-59 ml/min (HCC): Secondary | ICD-10-CM

## 2021-02-28 DIAGNOSIS — J479 Bronchiectasis, uncomplicated: Secondary | ICD-10-CM

## 2021-02-28 DIAGNOSIS — J302 Other seasonal allergic rhinitis: Secondary | ICD-10-CM

## 2021-02-28 NOTE — Progress Notes
Date of Service: 02/28/2021  Obtained patient's verbal consent to treat them and their agreement to [facility] financial policy and NPP via this telehealth visit during the [facility] Emergency.  Ruben Wood. is a 81 y.o. male. DOB: [birth year]   MRN#: [id number]    Subjective:   Chief Complaint   Patient presents with   ? Follow Up     Cellulitis on leg          History of Present Illness  Ruben Wood. is 82 y.o. [name] patient who presents for telehealth visit for .  He has a history of type 2 diabetes, severe asthma with bronchiectasis, peripheral vascular disease, hyperlipidemia, ischemic cardiomyopathy with ICD implantation, hypertension, coronary artery disease, stage III CKD, hypergammaglobulinemia and sleep apnea on CPAP.    Today he is accompanied with his wife, Ruben Wood.  On 02/19/2022 he was getting into the car to celebrate his wife's birthday.  Unfortunately the corner of the car door hit against his right leg.  He did have a skin tear and some erythema.  He saw Dr. Betti Cruz a couple days ago where they circled the area of erythema.  It was pretty red over the weekend and she applied Neosporin.  Today the top half of the wound has completely healed.  The skin tear portion is also starting to heal.  It does have mild erythema that looks quite a bit better.  He denies any discharge or pain.  His wife does not feel like he needs any antibiotics for this.  She keeps a very close eye on it as he has had cellulitis in the past.    He does have dental pain.  He has a crown that has been giving him problems.  He has been following a dentist for the past couple months in regards to this.  He has a follow-up appointment with them tomorrow.    He also feels like he is having a little asthma flare.  His symptoms started yesterday and he started prednisone.  He also increased his albuterol nebulizers and feels better today.  He unfortunately suffers from severe asthma and has a good rescue plan in place.      BP Readings from Last 5 Encounters:   02/24/21 118/58   02/08/21 111/65   01/27/21 125/64   01/12/21 134/61   12/21/20 133/87     Wt Readings from Last 5 Encounters:   02/24/21 126.1 kg (278 lb)   02/08/21 127 kg (280 lb)   01/27/21 127 kg (280 lb)   01/12/21 128.8 kg (284 lb)   12/21/20 124 kg (273 lb 6.4 oz)       Social History     Socioeconomic History   ? Marital status: Married     Spouse name: Ruben Wood   ? Number of children: 0   Occupational History   ? Occupation: former Therapist, music: RETIRED     Comment: lives in a 82 year old house with wife   Tobacco Use   ? Smoking status: Former     Packs/day: 2.00     Years: 40.00     Pack years: 80.00     Types: Cigarettes     Quit date: 07/09/1998     Years since quitting: 22.6   ? Smokeless tobacco: Never   Vaping Use   ? Vaping Use: Never used   Substance and Sexual Activity   ? Alcohol use: Yes  Comment: occasion   ? Drug use: Never   ? Sexual activity: Not Currently       Objective:     ? acetaminophen (TYLENOL) 325 mg tablet Take two tablets by mouth every 6 hours as needed for Pain.   ? albuterol 0.083% (PROVENTIL) 2.5 mg /3 mL (0.083 %) nebulizer solution Inhale 3 mL solution by nebulizer as directed every 6 hours as needed for Wheezing or Shortness of Breath. Indications: asthma   ? albuterol sulfate (PROAIR HFA) 90 mcg/actuation HFA aerosol inhaler Inhale two puffs by mouth into the lungs every 4 hours as needed for Wheezing or Shortness of Breath.   ? allopurinoL (ZYLOPRIM) 300 mg tablet Take 1 tablet by mouth once daily with food   ? AMITR/GABAPEN/EMU OIL 05/24/08% CREAM (COMPOUND) Apply topically to affected area three times daily as needed.   ? arformoteroL (BROVANA) 15 mcg/2 mL nebulizer solution Inhale 2 mL solution by nebulizer as directed twice daily. Dx: J44.9   ? ASCORBIC ACID-ASCORBATE SODIUM PO Take 500 mg by mouth every morning.   ? atorvastatin (LIPITOR) 40 mg tablet Take 1 tablet by mouth once daily   ? azelastine (ASTELIN) 137 mcg (0.1 %) nasal spray Apply two sprays to each nostril as directed twice daily. Use in each nostril as directed   ? benzonatate (TESSALON PERLES) 100 mg capsule Take one capsule by mouth every 8 hours.   ? budesonide (PULMICORT) 0.25 mg/2 mL nebulizer solution Inhale 2 mL solution by nebulizer as directed twice daily. Dx: J44.9   ? bumetanide (BUMEX) 1 mg tablet TAKE 2 TABLETS BY MOUTH TWICE DAILY   ? cholecalciferol (VITAMIN D-3) 1,000 units tablet Take 1,000 Units by mouth daily.   ? duloxetine DR (CYMBALTA) 60 mg capsule Take 1 capsule by mouth once daily   ? finasteride (PROSCAR) 5 mg tablet Take one tablet by mouth daily.   ? fish oil- omega 3-DHA/EPA 300/1,000 mg capsule Take 1 capsule by mouth daily.   ? flash glucose scanning reader (FREESTYLE LIBRE) reader Use as directed.   ? flash glucose sensor (FREESTYLE LIBRE 14 DAY SENSOR) sensor Type 2 diabetes  Indications: type 2 diabetes mellitus   ? gabapentin (NEURONTIN) 100 mg capsule Take four capsules by mouth twice daily.   ? insulin pen needles (disposable) (BD ULTRA-FINE MINI PEN NEEDLE) 31 gauge x 3/16 pen needle Use with insulin pens   ? ipratropium bromide (ATROVENT) 21 mcg (0.03 %) nasal spray Apply two sprays to each nostril as directed every 12 hours.   ? JARDIANCE 25 mg tablet Take 1 tablet by mouth once daily   ? levoFLOXacin (LEVAQUIN) 750 mg tablet Take one tablet by mouth daily.   ? magnesium oxide 400 mg magnesium capsule Take one capsule by mouth twice daily.   ? metOLazone (ZAROXOLYN) 2.5 mg tablet Take one tablet by mouth daily as needed. PER OUTPATIENT CARDIOLOGY PROVIDER AS NEEDED FOR FLUID RETENTION   ? Miscellaneous Medical Supply misc itted for LE gradual compression stockings at a medical supply store   ? Miscellaneous Medical Supply misc Vancomycin 1,500 mg IV every 24hours starting 06/25/20 x 4 days (14 total antibiotic days), please draw vancomycin level per your protocol at [facility]. ? Miscellaneous Medical Supply misc Rx: Please wrap legs with ACE bandage from toes as high up to the leg as possible bilaterally  Dx: Lymphedema   ? montelukast (SINGULAIR) 10 mg tablet Take one tablet by mouth at bedtime daily.   ? nitroglycerin (NITROSTAT)  0.4 mg tablet Place 0.4 mg under tongue every 5 minutes as needed for Chest Pain. Max of 3 tablets, call 911.   ? nystatin (MYCOSTATIN) 100,000 unit/g topical cream Apply  topically to affected area twice daily.   ? nystatin (NYSTOP) 100,000 unit/g topical powder Apply  topically to affected area four times daily.   ? pantoprazole DR (PROTONIX) 40 mg tablet Take one tablet by mouth twice daily.   ? polyethylene glycol 3350 (MIRALAX) 17 g packet Take two packets by mouth twice daily.   ? predniSONE (DELTASONE) 20 mg tablet Take two tablets by mouth daily. For 5 days   ? rivaroxaban (XARELTO) 20 mg tablet Take one tablet by mouth daily with dinner. Take with food.   ? sodium chloride (HYPER-SAL) 3.5 % inhalation solution Inhale 4 mL by mouth into the lungs twice daily. 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 spironolactone (ALDACTONE) 25 mg tablet Take one tablet by mouth twice daily. Take with food.   ? tamsulosin (FLOMAX) 0.4 mg capsule Take one capsule by mouth daily. Do not crush, chew or open capsules. Take 30 minutes following the same meal each day.   ? tirzepatide (MOUNJARO) 5 mg/0.5 mL injector EN Inject 0.5 mL under the skin every 7 days.   ? traMADoL (ULTRAM) 50 mg tablet Take one tablet by mouth every 8 hours as needed for Pain.   ? zinc sulfate 220 mg (50 mg elemental zinc) capsule Take 220 mg by mouth daily.     There were no vitals filed for this visit.  There is no height or weight on file to calculate BMI.     Physical Exam  Constitutional:       Appearance: Normal appearance.   Skin:     Comments: See pictures    Neurological:      General: No focal deficit present.      Mental Status: He is alert. Mental status is at baseline. He is disoriented.   Psychiatric: Mood and Affect: Mood normal.         Behavior: Behavior normal.         Thought Content: Thought content normal.         Judgment: Judgment normal.            Assessment and Plan:    RIGHT lower extremity skin tear:   > Injury date: 02/19/22  > Improving with home wound care  > Will continue to monitor    Moderate Persistent asthma:  > Continue montelukast 10 mg daily  > Continue Astelin  > Continue budesonide nebulizers twice daily  > Continue Brovana 1 nebulizer twice daily  > Continue ProAir and albuterol nebulizers as needed  > Currently on prednisone-we will continue to monitor symptoms of asthma-improved today    Dental pain  -Following closely with his dentist.  Next appointment tomorrow.    Keep follow-up appointment with Dr. Crecencio Mc in 1 week.  Cindee Lame Berenice Bouton. was seen today for follow up.    Diagnoses and all orders for this visit:    Noninfected skin tear of right lower extremity, initial encounter    Peripheral vascular disease [facility])           Future Appointments   Date Time Provider Department Center   03/08/2021 10:20 AM Comfort, [name], MD [facility] IM   03/09/2021  3:00 PM Gardiner Ramus, PHARMD [facility]ACH[facility] CVM Exam   03/18/2021 11:00 AM Deveron Furlong, MD QVAPULM IM   03/24/2021  4:00 PM  MAC REMOTE MONITORING MACREMOTEHRM CVM Procedur   04/25/2021  4:00 PM MAC REMOTE MONITORING MACREMOTEHRM CVM Procedur   05/04/2021  1:30 PM CVM SN ECHO 1 SNECHO CVM Procedur   05/04/2021  3:30 PM Vanetta Shawl I, MD CVMSNCL CVM Exam   05/11/2021 11:30 AM Comfort, [name], MD [facility] IM   05/26/2021  4:00 PM MAC REMOTE MONITORING MACREMOTEHRM CVM Procedur   06/02/2021  1:00 PM Raelyn Mora, PA-C [facility] Urology   06/06/2021 10:00 AM Donnelly Stager, MD MACKUCL CVM Exam   06/06/2021 10:00 AM [place] PACEMAKER [facility] CVM Procedur   06/27/2021  4:00 PM MAC REMOTE MONITORING MACREMOTEHRM CVM Procedur       Hatsuko Bizzarro A Shahmeer Bunn, APRN-NP             Total of 30 minutes were spent on the same day of the visit including preparing to see the patient, obtaining and/or reviewing separately obtained history, performing a medically appropriate examination and/or evaluation, counseling and educating the patient/family/caregiver, ordering medications, tests, or procedures, referring and communication with other health care professionals, documenting clinical information in the electronic or other health record, independently interpreting results and communicating results to the patient/family/caregiver, and care coordination. I used a dictation program when conducting this note. Due to this, there may be grammatical errors or inconsistencies. For clarification or questions, please contact me.

## 2021-02-28 NOTE — Patient Instructions
It was great seeing you today    Assessment and Plan:    RIGHT lower extremity skin tear:   > Injury date: 02/19/22  > Improving with home wound care  > Will continue to monitor    Moderate Persistent asthma:  > Continue montelukast 10 mg daily  > Continue Astelin  > Continue budesonide nebulizers twice daily  > Continue Brovana 1 nebulizer twice daily  > Continue ProAir and albuterol nebulizers as needed  > Currently on prednisone-we will continue to monitor symptoms of asthma-improved today    Dental pain  -Following closely with his dentist.  Next appointment tomorrow.    Please do not hesitate to reach out to our clinic if you have any questions or concerns.  Take Care,  Budd Palmer, APRN-NP      Routine Clinic Information:  How to reach Dionne Milo: Please send a MyChart message to the General Medicine clinic or call my nurse, Sandrea Hammond, at [phone]. When calling please leave your name (including spelling), date of birth, phone number where you can be reached and a description of why you are calling. Sandrea Hammond will return your call as soon as possible.  Sometimes the clinic can get very busy and we might not be able to get to your phone call until later in the day.  If you need to send a fax to our office, our fax number is [phone].    For urgent issues after business hours/weekends/holidays call [phone] and request for the outpatient internal medicine physician to be paged    We offer same day appointments for your acute health concerns. These appointments are on a first come, first serve basis. I am available for these appointments daily- just ask for [facility] if you would like to be seen. Please call [phone] if you would like to make an appointment.     Lab Results: Due to the CARES act, as of April 1st, 2021 lab results automatically release to MyChart. Dionne Milo will continue to send you a result note on any labs that she orders. With these changes you may see your results before she does. Critical lab results will be addressed immediately. Please allow up to 72 hours for Dionne Milo to review and respond to your results before reaching out with any questions.    For refills on medications, please have your pharmacy fax a refill authorization request form to our office at Fax) [phone]. Please allow at least 3 business days for refill requests.

## 2021-03-03 ENCOUNTER — Encounter: Admit: 2021-03-03 | Discharge: 2021-03-03 | Payer: MEDICARE

## 2021-03-03 NOTE — Progress Notes
Remote Patient Monitoring Risk Alert  I received a reading from you today I was concerned about.   Are you having any:  Shortness of air?Yes  slightly more than usual   Productive cough  Chest Pain? c/o indigestion started last night, worse after eating; taking OTC simethecone. Wife states he is planning to take a shower soon; planning take him for eval if "indigestion" is worse after shower, but suspects it may be due to antibiotics, meatball sandwich for lunch, and Mounjaro.   Edema? No  Dizziness? No  Have you taken all your medications today?Yes   Are you able to repeat the reading while I am on the phone with you? Yes. Repeat measure is deemed as medium risk. I will notify your provider. O2- 87% on recheck with O2 at 4L, then 92% at second recheck.     Taking abx for tooth infection (clindamycin). Started PRN prednisone on 1/7, took only for 2 days. Did not start Levaquin      Time Spent (minutes): 15

## 2021-03-04 ENCOUNTER — Encounter: Admit: 2021-03-04 | Discharge: 2021-03-04 | Payer: MEDICARE

## 2021-03-04 NOTE — Telephone Encounter
The Prior Authorization for [facility] for [name]. has been submitted. Will continue to follow.     Thornell Sartorius  Primary Care Pharmacy Technician Coordinator   [phone]

## 2021-03-07 ENCOUNTER — Encounter: Admit: 2021-03-07 | Discharge: 2021-03-07 | Payer: MEDICARE

## 2021-03-08 ENCOUNTER — Encounter: Admit: 2021-03-08 | Discharge: 2021-03-08 | Payer: MEDICARE

## 2021-03-08 ENCOUNTER — Ambulatory Visit: Admit: 2021-03-08 | Discharge: 2021-03-08 | Payer: MEDICARE

## 2021-03-08 VITALS — BP 106/67 | HR 96 | Temp 97.90000°F | Resp 22 | Ht 71.0 in | Wt 272.0 lb

## 2021-03-08 DIAGNOSIS — M961 Postlaminectomy syndrome, not elsewhere classified: Secondary | ICD-10-CM

## 2021-03-08 DIAGNOSIS — K219 Gastro-esophageal reflux disease without esophagitis: Secondary | ICD-10-CM

## 2021-03-08 DIAGNOSIS — J189 Pneumonia, unspecified organism: Secondary | ICD-10-CM

## 2021-03-08 DIAGNOSIS — J45909 Unspecified asthma, uncomplicated: Secondary | ICD-10-CM

## 2021-03-08 DIAGNOSIS — Z95828 Presence of other vascular implants and grafts: Secondary | ICD-10-CM

## 2021-03-08 DIAGNOSIS — R053 Chronic cough: Secondary | ICD-10-CM

## 2021-03-08 DIAGNOSIS — I2510 Atherosclerotic heart disease of native coronary artery without angina pectoris: Secondary | ICD-10-CM

## 2021-03-08 DIAGNOSIS — N183 Stage 3 chronic kidney disease, unspecified whether stage 3a or 3b CKD (HCC): Secondary | ICD-10-CM

## 2021-03-08 DIAGNOSIS — J470 Bronchiectasis with acute lower respiratory infection: Secondary | ICD-10-CM

## 2021-03-08 DIAGNOSIS — M954 Acquired deformity of chest and rib: Secondary | ICD-10-CM

## 2021-03-08 DIAGNOSIS — L03116 Cellulitis of left lower limb: Secondary | ICD-10-CM

## 2021-03-08 DIAGNOSIS — I714 AAA (abdominal aortic aneurysm): Secondary | ICD-10-CM

## 2021-03-08 DIAGNOSIS — I509 Heart failure, unspecified: Secondary | ICD-10-CM

## 2021-03-08 DIAGNOSIS — I10 Essential (primary) hypertension: Secondary | ICD-10-CM

## 2021-03-08 DIAGNOSIS — J439 Emphysema, unspecified: Secondary | ICD-10-CM

## 2021-03-08 DIAGNOSIS — Z9981 Dependence on supplemental oxygen: Secondary | ICD-10-CM

## 2021-03-08 DIAGNOSIS — G629 Polyneuropathy, unspecified: Secondary | ICD-10-CM

## 2021-03-08 DIAGNOSIS — D509 Iron deficiency anemia, unspecified: Secondary | ICD-10-CM

## 2021-03-08 DIAGNOSIS — R609 Edema, unspecified: Secondary | ICD-10-CM

## 2021-03-08 DIAGNOSIS — I5042 Chronic combined systolic (congestive) and diastolic (congestive) heart failure: Secondary | ICD-10-CM

## 2021-03-08 DIAGNOSIS — Z8614 Personal history of Methicillin resistant Staphylococcus aureus infection: Secondary | ICD-10-CM

## 2021-03-08 DIAGNOSIS — G4733 Obstructive sleep apnea (adult) (pediatric): Secondary | ICD-10-CM

## 2021-03-08 DIAGNOSIS — I4891 Unspecified atrial fibrillation: Secondary | ICD-10-CM

## 2021-03-08 DIAGNOSIS — E782 Mixed hyperlipidemia: Secondary | ICD-10-CM

## 2021-03-08 DIAGNOSIS — L304 Erythema intertrigo: Principal | ICD-10-CM

## 2021-03-08 DIAGNOSIS — U071 Pneumonia due to COVID-19 virus: Secondary | ICD-10-CM

## 2021-03-08 DIAGNOSIS — Z8679 Personal history of other diseases of the circulatory system: Secondary | ICD-10-CM

## 2021-03-08 DIAGNOSIS — J479 Bronchiectasis, uncomplicated: Secondary | ICD-10-CM

## 2021-03-08 DIAGNOSIS — J302 Other seasonal allergic rhinitis: Secondary | ICD-10-CM

## 2021-03-08 DIAGNOSIS — N401 Enlarged prostate with lower urinary tract symptoms: Secondary | ICD-10-CM

## 2021-03-08 DIAGNOSIS — E119 Type 2 diabetes mellitus without complications: Secondary | ICD-10-CM

## 2021-03-08 DIAGNOSIS — R0600 Dyspnea, unspecified: Secondary | ICD-10-CM

## 2021-03-08 DIAGNOSIS — J438 Other emphysema: Secondary | ICD-10-CM

## 2021-03-08 DIAGNOSIS — J449 Chronic obstructive pulmonary disease, unspecified: Secondary | ICD-10-CM

## 2021-03-08 DIAGNOSIS — I429 Cardiomyopathy, unspecified: Secondary | ICD-10-CM

## 2021-03-08 MED ORDER — RXAMB AMITR-GABAPEN-EMU OIL 4-4-10% TOPICAL CREAM (COMPOUND)
Freq: Two times a day (BID) | 11 refills | Status: AC | PRN
Start: 2021-03-08 — End: ?
  Filled 2021-03-09: qty 2, 15d supply, fill #1

## 2021-03-08 MED ORDER — TRULICITY 0.75 MG/0.5 ML SC PNIJ
.75 mg | SUBCUTANEOUS | 0 refills | Status: AC
Start: 2021-03-08 — End: ?
  Filled 2021-03-08: qty 4, 28d supply, fill #1

## 2021-03-08 MED ORDER — MOUNJARO 10 MG/0.5 ML SC PNIJ
10 mg | SUBCUTANEOUS | 1 refills | Status: DC
Start: 2021-03-08 — End: 2021-03-08

## 2021-03-08 MED ORDER — LEVOFLOXACIN 750 MG PO TAB
750 mg | ORAL_TABLET | Freq: Every day | ORAL | 3 refills | 7.00000 days | Status: AC
Start: 2021-03-08 — End: ?

## 2021-03-08 MED ORDER — NYSTATIN 100,000 UNIT/GRAM TP OINT
Freq: Three times a day (TID) | TOPICAL | 3 refills | 30.00000 days | Status: AC
Start: 2021-03-08 — End: ?

## 2021-03-08 MED ORDER — NYSTATIN 100,000 UNIT/GRAM TP OINT
Freq: Three times a day (TID) | TOPICAL | 3 refills | 30.00000 days | Status: DC
Start: 2021-03-08 — End: 2021-03-08

## 2021-03-08 MED ORDER — GABAPENTIN 100 MG PO CAP
400 mg | ORAL_CAPSULE | Freq: Two times a day (BID) | ORAL | 3 refills | Status: AC
Start: 2021-03-08 — End: ?

## 2021-03-08 MED ORDER — PREDNISONE 20 MG PO TAB
40 mg | ORAL_TABLET | Freq: Every day | ORAL | 3 refills | Status: AC
Start: 2021-03-08 — End: ?

## 2021-03-08 NOTE — Patient Instructions
Future Appointments   Date Time Provider Department Center   03/18/2021 11:00 AM Deveron Furlong, MD QVAPULM IM   03/24/2021  4:00 PM MAC REMOTE MONITORING MACREMOTEHRM CVM Procedur   03/28/2021 10:00 AM Gardiner Ramus, PHARMD [facility]ACHFC CVM Exam   04/25/2021  4:00 PM MAC REMOTE MONITORING MACREMOTEHRM CVM Procedur   05/04/2021  1:30 PM CVM SN ECHO 1 SNECHO CVM Procedur   05/04/2021  3:30 PM Vanetta Shawl I, MD CVMSNCL CVM Exam   05/11/2021 11:30 AM Gurjit Loconte, [name], MD [facility]ENMED IM   05/26/2021  4:00 PM MAC REMOTE MONITORING MACREMOTEHRM CVM Procedur   06/02/2021  1:00 PM Raelyn Mora, PA-C [facility] Urology   06/06/2021 10:00 AM Donnelly Stager, MD [facility]CL CVM Exam   06/06/2021 10:00 AM [place] PACEMAKER [facility]ACKUHRM CVM Procedur   06/27/2021  4:00 PM MAC REMOTE MONITORING MACREMOTEHRM CVM Procedur       If you are on Mychart, you will now be able to read your clinic note from me.  My hope is that this will improve your engagement and insight into your health care and improve the accuracy of your medical record.  If you find inaccuracies, I apologize.  Please bring any concerns or inaccuracies to your next appointment.  Please remember that medical language and documentation is very different from how you and I speak and our dictation software is not perfect.    General Instructions:  How to reach me:   Please send a MyChart message to the General Medicine clinic or call Wynona Canes at [phone].    How to get a medication refill:  Please use the MyChart Refill request or contact your pharmacy directly to request medication refills. Please allow 48 hours.     How to receive your test results:  If you have signed up for MyChart, you will receive your test results and messages from me this way.  Otherwise, you will get a phone call or letter.   If you are expecting results and have not heard from my office within 2 weeks of your testing, please send a MyChart message or call my office.     Scheduling:  Our Scheduling phone number is [phone].  Same Day appointments are usually available. Please ask for an annual or yearly physical appointment if it has been over 1 year since our last appointment.  Support groups for many chronic illnesses are available through [facility]: [email or web address] or [phone].

## 2021-03-08 NOTE — Telephone Encounter
The Prior Authorization for Greggory Keen was denied for [employer][name] Routing to clinic pharmacist for review. Please contact pharmacy with any questions regarding next steps.    Reason for denial:   Product service not covered. Try Ozempic or covered alternative.     Thornell Sartorius  Primary Care Pharmacy Technician Coordinator   [phone]

## 2021-03-08 NOTE — Progress Notes
Date of Service: 03/08/2021    Ruben Wood. Ruben Wood is a 81 y.o. male. DOB: [birth year]   MRN#: [id number]    Subjective:       History of Present Illness     Acutely, it's a good morning; feels breathing is better today (4L, 97%) Missed last injection of tizepatide; pharmacy been waiting for silver script approval on refill for over a week. Wife called and was told it was an Issue with insurance/doctor getting together to approve.  Groin rash -  Has had for a couple of weeks and feels is not getting worse, but is not resolving. No drainage, cellulitis. They have put desitin and a cream received from hospital of uncertain nature on it.  Pt has history of candidal intertrigo; was prescribed nystatin powder in 10/22, but did not use.    INTERVAL HX-   Spine steroid injections - deferred d/t abx tx at the time.    Leg injury/cellulitis - looked at today, now well healed with no signs of cellulitis.    Dental pain - received abx from dentist (Clinda? 300 qid) in [place].  Stopped 1/15 d/t heartburn;  Reports significant increase in mucus clearance and better breathing on abx.    DM -  Weight holding at 272 - attributes to eating candy while bored. Unable to focus on reading, has recently quit book club d/t failing to finish.  No GI side effects from current GLP-1 tx; very unhappy with delays in tizepatide refill, and is expecting to advance dosing.    Asthma - f/w pulm - trouble getting prednisone/levaquin d/t insurance changes (not covered at local pharmacy, [place] [facility] does not always have in stock); feels they are very very helpful.  As above, attributes recent improvement in work of breathing and mucus clearance to dental clinda. Has been adhering to his vest and nebulizer treatments.       Review of Systems   Constitutional: Negative for chills, fever and night sweats.   Eyes: Positive for discharge (watery; reports some matting in AM). Negative for blurred vision, double vision, pain, photophobia, redness, vision loss in left eye, vision loss in right eye and visual disturbance.   Cardiovascular: Negative for chest pain and palpitations.   Respiratory: Positive for shortness of breath (Better than usual today).    Gastrointestinal: Negative for abdominal pain, constipation, diarrhea, nausea and vomiting.   Genitourinary: Negative for dysuria and hematuria.         Objective:     ? acetaminophen (TYLENOL) 325 mg tablet Take two tablets by mouth every 6 hours as needed for Pain.   ? albuterol 0.083% (PROVENTIL) 2.5 mg /3 mL (0.083 %) nebulizer solution Inhale 3 mL solution by nebulizer as directed every 6 hours as needed for Wheezing or Shortness of Breath. Indications: asthma   ? albuterol sulfate (PROAIR HFA) 90 mcg/actuation HFA aerosol inhaler Inhale two puffs by mouth into the lungs every 4 hours as needed for Wheezing or Shortness of Breath.   ? allopurinoL (ZYLOPRIM) 300 mg tablet Take 1 tablet by mouth once daily with food   ? AMITR/GABAPEN/EMU OIL 05/24/08% CREAM (COMPOUND) Apply topically to affected area three times daily as needed.   ? arformoteroL (BROVANA) 15 mcg/2 mL nebulizer solution Inhale 2 mL solution by nebulizer as directed twice daily. Dx: J44.9   ? ASCORBIC ACID-ASCORBATE SODIUM PO Take 500 mg by mouth every morning.   ? atorvastatin (LIPITOR) 40 mg tablet Take 1 tablet by mouth once daily   ?  azelastine (ASTELIN) 137 mcg (0.1 %) nasal spray Apply two sprays to each nostril as directed twice daily. Use in each nostril as directed   ? benzonatate (TESSALON PERLES) 100 mg capsule Take one capsule by mouth every 8 hours.   ? budesonide (PULMICORT) 0.25 mg/2 mL nebulizer solution Inhale 2 mL solution by nebulizer as directed twice daily. Dx: J44.9   ? bumetanide (BUMEX) 1 mg tablet TAKE 2 TABLETS BY MOUTH TWICE DAILY   ? cholecalciferol (VITAMIN D-3) 1,000 units tablet Take 1,000 Units by mouth daily.   ? duloxetine DR (CYMBALTA) 60 mg capsule Take 1 capsule by mouth once daily ? finasteride (PROSCAR) 5 mg tablet Take one tablet by mouth daily.   ? fish oil- omega 3-DHA/EPA 300/1,000 mg capsule Take 1 capsule by mouth daily.   ? flash glucose scanning reader (FREESTYLE LIBRE) reader Use as directed.   ? flash glucose sensor (FREESTYLE LIBRE 14 DAY SENSOR) sensor Type 2 diabetes  Indications: type 2 diabetes mellitus   ? gabapentin (NEURONTIN) 100 mg capsule Take four capsules by mouth twice daily.   ? insulin pen needles (disposable) (BD ULTRA-FINE MINI PEN NEEDLE) 31 gauge x 3/16 pen needle Use with insulin pens   ? ipratropium bromide (ATROVENT) 21 mcg (0.03 %) nasal spray Apply two sprays to each nostril as directed every 12 hours.   ? JARDIANCE 25 mg tablet Take 1 tablet by mouth once daily   ? levoFLOXacin (LEVAQUIN) 750 mg tablet Take one tablet by mouth daily.   ? magnesium oxide 400 mg magnesium capsule Take one capsule by mouth twice daily.   ? metOLazone (ZAROXOLYN) 2.5 mg tablet Take one tablet by mouth daily as needed. PER OUTPATIENT CARDIOLOGY PROVIDER AS NEEDED FOR FLUID RETENTION   ? Miscellaneous Medical Supply misc itted for LE gradual compression stockings at a medical supply store   ? Miscellaneous Medical Supply misc Vancomycin 1,500 mg IV every 24hours starting 06/25/20 x 4 days (14 total antibiotic days), please draw vancomycin level per your protocol at [facility].   ? Miscellaneous Medical Supply misc Rx: Please wrap legs with ACE bandage from toes as high up to the leg as possible bilaterally  Dx: Lymphedema   ? montelukast (SINGULAIR) 10 mg tablet Take one tablet by mouth at bedtime daily.   ? nitroglycerin (NITROSTAT) 0.4 mg tablet Place 0.4 mg under tongue every 5 minutes as needed for Chest Pain. Max of 3 tablets, call 911.   ? nystatin (MYCOSTATIN) 100,000 unit/g topical cream Apply  topically to affected area twice daily.   ? nystatin (NYSTOP) 100,000 unit/g topical powder Apply  topically to affected area four times daily.   ? pantoprazole DR (PROTONIX) 40 mg tablet Take one tablet by mouth twice daily.   ? polyethylene glycol 3350 (MIRALAX) 17 g packet Take two packets by mouth twice daily.   ? predniSONE (DELTASONE) 20 mg tablet Take two tablets by mouth daily. For 5 days   ? rivaroxaban (XARELTO) 20 mg tablet Take one tablet by mouth daily with dinner. Take with food.   ? sodium chloride (HYPER-SAL) 3.5 % inhalation solution Inhale 4 mL by mouth into the lungs twice daily. 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 spironolactone (ALDACTONE) 25 mg tablet Take one tablet by mouth twice daily. Take with food.   ? tamsulosin (FLOMAX) 0.4 mg capsule Take one capsule by mouth daily. Do not crush, chew or open capsules. Take 30 minutes following the same meal each day.   ?  tirzepatide (MOUNJARO) 10 mg/0.5 mL injector PEN Inject 0.5 mL under the skin every 7 days.   ? traMADoL (ULTRAM) 50 mg tablet Take one tablet by mouth every 8 hours as needed for Pain.   ? zinc sulfate 220 mg (50 mg elemental zinc) capsule Take 220 mg by mouth daily.     Vitals:    03/08/21 0958   BP: 106/67   BP Source: Arm, Left Upper   Pulse: 96   Temp: 36.6 ?C (97.9 ?F)   Resp: 22   SpO2: 98%  Comment: 4LNC   PF: Comment: 4LNC   TempSrc: Oral   Weight: 123.4 kg (272 lb)   Height: 180.3 cm (5' 11)     Body mass index is 37.94 kg/m?[name]     Physical Exam  HENT:      Right Ear: External ear normal.      Left Ear: External ear normal.      Nose: Nose normal.      Mouth/Throat:      Mouth: Mucous membranes are moist.      Pharynx: Oropharynx is clear.   Eyes:      Extraocular Movements: Extraocular movements intact.      Conjunctiva/sclera: Conjunctivae normal.      Pupils: Pupils are equal, round, and reactive to light.   Cardiovascular:      Rate and Rhythm: Normal rate and regular rhythm.      Pulses: Normal pulses.      Heart sounds: Normal heart sounds.   Pulmonary:      Effort: Pulmonary effort is normal. No respiratory distress.      Breath sounds: No stridor. Wheezing (mild expiratory) present. No rhonchi or rales.      Comments: BS diminished in all fields; pt on 4L by NC  Abdominal:      General: There is no distension.      Palpations: Abdomen is soft.      Tenderness: There is no abdominal tenderness. There is no guarding.          Comments: Confluent erythematous plaques admixed with small pustules at follicles   Skin:     Comments: BLE hyperpigmented, roughened and dry without edema   Neurological:      General: No focal deficit present.      Mental Status: He is oriented to person, place, and time.   Psychiatric:         Mood and Affect: Mood normal.         Thought Content: Thought content normal.         Judgment: Judgment normal.              Assessment and Plan:  Intertrigo   Suspect fungal etiology vs bacterial given hx of untreated fungal intertrigo in same locations + failure to resolve on strong systemic abx   Nystatin ointment TID   Keep area clean and dry otherwise    Asthma  COPD  Bronchiectasis - has appt with Pulm on 1/27; continue with meds as current.   Will send prednisone/levaquin scripts to have on hand in event of exacerbation   Noted impact from dental abx believed to be clindamycin; may consider in future exacerbations if needed    DM  Obesity A1c 10/17 6.1   Continue current therapies as possible   Will send acute meds to [facility] d/t local pharmacy no longer being in-network for new rx payor   Recent switch in Rx payors is interrupting supply  of tirzepatide; pt considering switching back if availability of GLP-1s continues to be an issue.     Peripheral Neuropathy  -Refilled gabapentin and amitriptyline/gabapentin/emu topical    CHF: stable, well compensated today. Cont GDMT.     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 I discussed the case with the Medical Student, and concur with the Medical Student documentation of history, physical exam and treatment plan unless otherwise noted.     Staff name:  [name], MD Date of Service:  03/08/2021     Total of 40 minutes were spent on the same day of the visit including preparing to see the patient, obtaining and/or reviewing separately obtained history, performing a medically appropriate examination and/or evaluation, counseling and educating the patient/family/caregiver, ordering medications, tests, or procedures, referring and communication with other health care professionals, documenting clinical information in the electronic or other health record, independently interpreting results and communicating results to the patient/family/caregiver, and care coordination.

## 2021-03-09 ENCOUNTER — Encounter: Admit: 2021-03-09 | Discharge: 2021-03-09 | Payer: MEDICARE

## 2021-03-10 ENCOUNTER — Encounter: Admit: 2021-03-10 | Discharge: 2021-03-10 | Payer: MEDICARE

## 2021-03-10 MED FILL — SODIUM CHLORIDE 3.5 % IN NEBU: 3.5 % | RESPIRATORY_TRACT | 30 days supply | Qty: 240 | Fill #8 | Status: CP

## 2021-03-11 ENCOUNTER — Encounter: Admit: 2021-03-11 | Discharge: 2021-03-11 | Payer: MEDICARE

## 2021-03-11 ENCOUNTER — Ambulatory Visit: Admit: 2021-03-11 | Discharge: 2021-03-12 | Payer: MEDICARE

## 2021-03-11 DIAGNOSIS — J438 Other emphysema: Secondary | ICD-10-CM

## 2021-03-11 DIAGNOSIS — J9612 Chronic respiratory failure with hypercapnia: Secondary | ICD-10-CM

## 2021-03-11 MED ORDER — DOXYCYCLINE HYCLATE 100 MG PO TAB
100 mg | ORAL_TABLET | Freq: Two times a day (BID) | ORAL | 0 refills | 8.00000 days | Status: AC
Start: 2021-03-11 — End: ?

## 2021-03-11 NOTE — Telephone Encounter
I sent Dr. Crecencio Mc a message and scheduled patient for telehealth today @ 11:50 am.    Etta Grandchild, RN

## 2021-03-11 NOTE — Progress Notes
History of Present Illness  Ruben Asai. is a 81 y.o. male     Obtained patient's verbal consent to treat them and their agreement to [facility] financial policy and NPP via this telehealth visit during the [facility] Emergency    I saw I had a few days ago.  He was doing very well and is quite stable.  He does have significant chronic comorbidities.  However, yesterday he was at home and he stood up to transfer into his chair and he felt an immediate and significant and severe pain in his right knee.  He also noticed swelling in his right knee.  The pain was so significant that he became nauseous and felt like he was going to vomit.  He felt like his leg was warm.  Shortly after experiencing the knee pain, he started to develop significant swelling of the lower extremity on the right.  The swelling went all the way down to his foot.  As the course progressed, the swelling went up above his knee.  He went to the emergency department at [facility].  He actually called 911 to transport him there because he was in such significant pain.  When he went to the emergency department, they did an x-ray that was showing arthritis per the patient.  He did not have any ultrasound done or other studies performed.  He does not have any swelling on the left lower extremity today.  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  He has no personal history of blood clots, but is very sedentary.  He does have shortness of breath at baseline which has been attributed to his COPD-asthma overlap.  He does have chronic respiratory failure and is on 4 L of oxygen at baseline.  He has not had any change in his shortness of breath or oxygen requirements.     Review of Systems  Per HPI    Objective:         ? acetaminophen (TYLENOL) 325 mg tablet Take two tablets by mouth every 6 hours as needed for Pain.   ? albuterol 0.083% (PROVENTIL) 2.5 mg /3 mL (0.083 %) nebulizer solution Inhale 3 mL solution by nebulizer as directed every 6 hours as needed for Wheezing or Shortness of Breath. Indications: asthma   ? albuterol sulfate (PROAIR HFA) 90 mcg/actuation HFA aerosol inhaler Inhale two puffs by mouth into the lungs every 4 hours as needed for Wheezing or Shortness of Breath.   ? allopurinoL (ZYLOPRIM) 300 mg tablet Take 1 tablet by mouth once daily with food   ? amitr-gabapen-emu oil 05-24-08% topical cream (COMPOUND) Apply to affected areas twice daily as needed.   ? arformoteroL (BROVANA) 15 mcg/2 mL nebulizer solution Inhale 2 mL solution by nebulizer as directed twice daily. Dx: J44.9   ? ASCORBIC ACID-ASCORBATE SODIUM PO Take 500 mg by mouth every morning.   ? atorvastatin (LIPITOR) 40 mg tablet Take 1 tablet by mouth once daily   ? azelastine (ASTELIN) 137 mcg (0.1 %) nasal spray Apply two sprays to each nostril as directed twice daily. Use in each nostril as directed   ? benzonatate (TESSALON PERLES) 100 mg capsule Take one capsule by mouth every 8 hours.   ? budesonide (PULMICORT) 0.25 mg/2 mL nebulizer solution Inhale 2 mL solution by nebulizer as directed twice daily. Dx: J44.9   ? bumetanide (BUMEX) 1 mg tablet  TAKE 2 TABLETS BY MOUTH TWICE DAILY   ? cholecalciferol (VITAMIN D-3) 1,000 units tablet Take 1,000 Units by mouth daily.   ? dulaglutide (TRULICITY) 0.75 mg/0.5 mL injection pen Inject 0.5 mL under the skin every 7 days.   ? duloxetine DR (CYMBALTA) 60 mg capsule Take 1 capsule by mouth once daily   ? finasteride (PROSCAR) 5 mg tablet Take one tablet by mouth daily.   ? fish oil- omega 3-DHA/EPA 300/1,000 mg capsule Take 1 capsule by mouth daily.   ? flash glucose scanning reader (FREESTYLE LIBRE) reader Use as directed.   ? flash glucose sensor (FREESTYLE LIBRE 14 DAY SENSOR) sensor Type 2 diabetes  Indications: type 2 diabetes mellitus   ? gabapentin (NEURONTIN) 100 mg capsule Take four capsules by mouth twice daily.   ? insulin pen needles (disposable) (BD ULTRA-FINE MINI PEN NEEDLE) 31 gauge x 3/16 pen needle Use with insulin pens   ? ipratropium bromide (ATROVENT) 21 mcg (0.03 %) nasal spray Apply two sprays to each nostril as directed every 12 hours.   ? JARDIANCE 25 mg tablet Take 1 tablet by mouth once daily   ? levoFLOXacin (LEVAQUIN) 750 mg tablet Take one tablet by mouth daily.   ? magnesium oxide 400 mg magnesium capsule Take one capsule by mouth twice daily.   ? metOLazone (ZAROXOLYN) 2.5 mg tablet Take one tablet by mouth daily as needed. PER OUTPATIENT CARDIOLOGY PROVIDER AS NEEDED FOR FLUID RETENTION   ? Miscellaneous Medical Supply misc itted for LE gradual compression stockings at a medical supply store   ? Miscellaneous Medical Supply misc Vancomycin 1,500 mg IV every 24hours starting 06/25/20 x 4 days (14 total antibiotic days), please draw vancomycin level per your protocol at [facility].   ? Miscellaneous Medical Supply misc Rx: Please wrap legs with ACE bandage from toes as high up to the leg as possible bilaterally  Dx: Lymphedema   ? montelukast (SINGULAIR) 10 mg tablet Take one tablet by mouth at bedtime daily.   ? nitroglycerin (NITROSTAT) 0.4 mg tablet Place 0.4 mg under tongue every 5 minutes as needed for Chest Pain. Max of 3 tablets, call 911.   ? nystatin (MYCOSTATIN) 100,000 unit/g topical cream Apply  topically to affected area twice daily.   ? nystatin (MYCOSTATIN) 100,000 unit/g topical ointment Apply to affected areas TID   ? nystatin (NYSTOP) 100,000 unit/g topical powder Apply  topically to affected area four times daily.   ? pantoprazole DR (PROTONIX) 40 mg tablet Take one tablet by mouth twice daily.   ? polyethylene glycol 3350 (MIRALAX) 17 g packet Take two packets by mouth twice daily.   ? predniSONE (DELTASONE) 20 mg tablet Take two tablets by mouth daily. For 5 days   ? rivaroxaban (XARELTO) 20 mg tablet Take one tablet by mouth daily with dinner. Take with food.   ? sodium chloride (HYPER-SAL) 3.5 % inhalation solution Inhale 4 mL by mouth into the lungs twice daily. 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 spironolactone (ALDACTONE) 25 mg tablet Take one tablet by mouth twice daily. Take with food.   ? tamsulosin (FLOMAX) 0.4 mg capsule Take one capsule by mouth daily. Do not crush, chew or open capsules. Take 30 minutes following the same meal each day.   ? traMADoL (ULTRAM) 50 mg tablet Take one tablet by mouth every 8 hours as needed for Pain.   ? zinc sulfate 220 mg (50 mg elemental zinc) capsule Take 220 mg by mouth daily.  Vitals:    03/11/21 1128   PainSc: Five     There is no height or weight on file to calculate BMI.     Physical Exam  Constitutional:       Appearance: Normal appearance.   HENT:      Head: Normocephalic and atraumatic.   Pulmonary:      Effort: Pulmonary effort is normal.   Musculoskeletal:      Right lower leg: Edema present.      Left lower leg: No edema.      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  I would rate this approximately a 2 or 3+ on the right, but trace edema on the left.  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   Neurological:      General: No focal deficit present.      Mental Status: He is alert.   Psychiatric:         Mood and Affect: Mood normal.         Labwork reviewed:  Lab Results   Component Value Date/Time    HGBA1C 6.7 (H) 04/04/2020 05:40 AM    HGBA1C 5.9 11/25/2019 02:45 PM    HGBA1C 5.9 09/02/2019 09:50 AM    A1C 6.1 (A) 12/06/2020 12:00 AM    HGBPOC 14.3 04/08/2020 06:58 AM    HGBPOC 11.2 (L) 10/25/2012 10:58 AM    HGBPOC 12.9 (L) 10/25/2012 08:55 AM    MCALBR 7.2 05/18/2020 01:45 PM    TSH 2.49 03/05/2020 08:40 PM    FREET4R 0.99 12/15/2014 09:12 AM    CHOL 161 04/04/2020 05:40 AM    TRIG 152 (H) 04/04/2020 05:40 AM    HDL 53 04/04/2020 05:40 AM    LDL 87 04/04/2020 05:40 AM NA 136 (L) 12/21/2020 01:05 PM    K 4.2 12/21/2020 01:05 PM    CL 96 (L) 12/21/2020 01:05 PM    CO2 31 (H) 12/21/2020 01:05 PM    GAP 9 12/21/2020 01:05 PM    BUN 31 (H) 12/21/2020 01:05 PM    CR 1.65 (H) 12/21/2020 01:05 PM    GLU 100 12/21/2020 01:05 PM    CA 9.1 12/21/2020 01:05 PM    PO4 4.1 03/08/2020 03:51 AM    ALBUMIN 3.8 06/15/2020 02:50 PM    TOTPROT 6.0 06/15/2020 02:50 PM    ALKPHOS 67 06/15/2020 02:50 PM    AST 13 06/15/2020 02:50 PM    ALT 9 06/15/2020 02:50 PM    TOTBILI 0.8 06/15/2020 02:50 PM    GFR 41 09/27/2020 12:00 AM    GFRAA >60 12/03/2019 04:51 AM    PSA 2.96 11/25/2018 12:00 AM              Assessment and Plan:    1. Edema of right lower extremity    2. Other emphysema (HCC)    3. Chronic respiratory failure with hypercapnia (HCC)      He has new onset and sudden onset of right lower extremity edema.  This is very suspicious for a DVT I think that is the diagnosis that we need to rule out first and foremost.  He does have risk factors for this especially given that he is very sedentary.  His breathing status appears to be at his baseline currently as he is not having any  increase in shortness of breath and his oxygen requirements are stable.    He will need a Doppler study.  Coordinating this will be a challenge.  We need to get this done today.  We will try to get this done at [facility] which this is local hospital and we will be in touch with him throughout the afternoon to make sure that we get a result.    Total of 40 minutes were spent on the same day of the visit including preparing to see the patient, obtaining and/or reviewing separately obtained history, performing a medically appropriate examination and/or evaluation, counseling and educating the patient/family/caregiver, ordering medications, tests, or procedures, referring and communication with other health care professionals, documenting clinical information in the electronic or other health record, independently interpreting results and communicating results to the patient/family/caregiver, and care coordination.         There are no Patient Instructions on file for this visit.    No follow-ups on file.

## 2021-03-11 NOTE — Telephone Encounter
Dr. Crecencio Mc gave verbal orders for doxycycline 100 mg BID x 7 days and to have patient follow up with [place] on Monday to let us know how he is doing.    I called to advise.  Patient's wife states that Ed fell when he got home and they are not able to get him up and he's in extreme pain.  Waiting on ambulance to arrive    Wife requested antibiotic go to [facility] pharmacy.  Verbal orders placed  Etta Grandchild, RN

## 2021-03-11 NOTE — Telephone Encounter
This is terrible news. I think he needs in person evaluation. I am worried we are missing something. He has had a very quick decline. I agree with eval at local ED.

## 2021-03-11 NOTE — Telephone Encounter
Test results came in by fax normal study.  Routing to Dr. Crecencio Mc

## 2021-03-11 NOTE — Telephone Encounter
Good morning. I know that bc this is about Ruben Wood , I should use his Mychart, but I want to update you on him.   I did end up getting him to ER about 10 last night ,   His leg had swollen and was red clear up past his knee. He could not walk, called 911. No Dr, just EMT there. Did nothing! No blood work. X-rayed knee. Said if put him in hospital we would have to pay. I am a very unhappy camper .   I did get him home  with difficulty.   This morning he still cant bend it. Hurts badly. I dont think the swelling and redness are quite as bad, but not good, I am wondering if I should take him to get blood work?   Thank you . [name]

## 2021-03-12 ENCOUNTER — Encounter: Admit: 2021-03-12 | Discharge: 2021-03-12 | Payer: MEDICARE

## 2021-03-12 DIAGNOSIS — R600 Localized edema: Principal | ICD-10-CM

## 2021-03-12 DIAGNOSIS — J471 Bronchiectasis with (acute) exacerbation: Secondary | ICD-10-CM

## 2021-03-12 DIAGNOSIS — J438 Other emphysema: Secondary | ICD-10-CM

## 2021-03-12 DIAGNOSIS — J4550 Severe persistent asthma, uncomplicated: Secondary | ICD-10-CM

## 2021-03-12 MED ORDER — ALBUTEROL SULFATE 2.5 MG /3 ML (0.083 %) IN NEBU
3 mL | RESPIRATORY_TRACT | 11 refills | PRN
Start: 2021-03-12 — End: ?

## 2021-03-13 ENCOUNTER — Encounter: Admit: 2021-03-13 | Discharge: 2021-03-13 | Payer: MEDICARE

## 2021-03-14 ENCOUNTER — Encounter: Admit: 2021-03-14 | Discharge: 2021-03-14 | Payer: MEDICARE

## 2021-03-14 DIAGNOSIS — N183 Stage 3 chronic kidney disease, unspecified whether stage 3a or 3b CKD (HCC): Secondary | ICD-10-CM

## 2021-03-14 DIAGNOSIS — R600 Localized edema: Secondary | ICD-10-CM

## 2021-03-14 NOTE — Telephone Encounter
Dr. Crecencio Mc also advised on this.  Per [facility] lab orders were faxed to [facility] and sent to patient in my chart.    Dr. Crecencio Mc wants patient to start prednisone and see him on Wednesday ideally @ 9:00 am in person but can change to telehealth if they cannot get here.    They are in agreement with plan.    Etta Grandchild, RN

## 2021-03-14 NOTE — Telephone Encounter
Routing to Dr. Comfort to advise    Anna Livers, RN

## 2021-03-15 ENCOUNTER — Encounter: Admit: 2021-03-15 | Discharge: 2021-03-15 | Payer: MEDICARE

## 2021-03-15 NOTE — Telephone Encounter
[facility] called requesting faxed electronically signed doppler order.    Printed and faxed to [phone] with confirmation received.    Etta Grandchild, RN

## 2021-03-16 ENCOUNTER — Encounter: Admit: 2021-03-16 | Discharge: 2021-03-16 | Payer: MEDICARE

## 2021-03-16 ENCOUNTER — Ambulatory Visit: Admit: 2021-03-16 | Discharge: 2021-03-17 | Payer: MEDICARE

## 2021-03-16 DIAGNOSIS — N183 Stage 3 chronic kidney disease, unspecified whether stage 3a or 3b CKD (HCC): Secondary | ICD-10-CM

## 2021-03-16 DIAGNOSIS — L0390 Cellulitis, unspecified: Secondary | ICD-10-CM

## 2021-03-16 DIAGNOSIS — G4733 Obstructive sleep apnea (adult) (pediatric): Secondary | ICD-10-CM

## 2021-03-16 DIAGNOSIS — M10061 Idiopathic gout, right knee: Secondary | ICD-10-CM

## 2021-03-16 LAB — COMPREHENSIVE METABOLIC PANEL
ALBUMIN: 2.8 — AB (ref 3.5–5.2)
ALK PHOSPHATASE: 88
ALT: 16
ANION GAP: 13 — AB (ref 4–12)
AST: 20
BLD UREA NITROGEN: 52 — AB (ref 7–18)
CALCIUM: 9.8 (ref 8.5–10.5)
CHLORIDE: 95 — AB (ref 98–106)
CO2: 29
CREATININE: 1.9 — AB (ref 0.8–1.3)
GFR ESTIMATED: 34
GLUCOSE,PANEL: 153 — AB (ref 65–110)
POTASSIUM: 5.4 — AB (ref 3.6–5.2)
SODIUM: 132 — AB (ref 137–150)
TOTAL BILIRUBIN: 0.7
TOTAL PROTEIN: 8.3 — AB (ref 6.4–8.2)

## 2021-03-16 LAB — URIC ACID: URIC ACID: 8

## 2021-03-16 MED ORDER — PREDNISONE 10 MG PO TAB
ORAL_TABLET | Freq: Every day | ORAL | 0 refills | Status: AC
Start: 2021-03-16 — End: ?

## 2021-03-16 MED ORDER — ALLOPURINOL 300 MG PO TAB
300 mg | ORAL_TABLET | Freq: Every day | ORAL | 3 refills | 45.00000 days | Status: AC
Start: 2021-03-16 — End: ?

## 2021-03-16 NOTE — Progress Notes
History of Present Illness  Ruben Walenta. is a 81 y.o. male     Obtained patient's verbal consent to treat them and their agreement to [facility] financial policy and NPP via this telehealth visit during the [facility]    I saw Ruben Wood on 03/11/21 on telehealth for sudden onset right knee pain. The pain was very intense and he had associated swelling. The pain was so significant that he became nauseous and felt like he was going to vomit.  He felt like his leg was warm.  Shortly after experiencing the knee pain, he started to develop significant swelling of the lower extremity on the right.  The swelling went all the way down to his foot.  As the course progressed, the swelling went up above his knee.  He went to the emergency department at [facility].  He actually called 911 to transport him there because he was in such significant pain.  When he went to the emergency department, they did an x-ray that was showing arthritis per the patient.  He did not have any ultrasound done or other studies performed.  He does not have any swelling on the left lower extremity today.  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  I had him obtain a doppler study at [facility] which was negative for DVT.     We thought that maybe this was related to a gout attack. I had him start taking prednisone over the weekend. He has a history of gout and has had attacks like this before.  Today, he does note that he is having some improvement in the swelling.  He is able to stand on this little bit better today than he was before starting the prednisone.  He has done 2 full days of this and just took his third dose today.  He does not have any fevers or chills.    He did have a fall on Friday evening.  He developed a wound on his right lower extremity.  He is on doxycycline for this.  There is minimal surrounding erythema, but there is a lot of serous drainage coming from that wound.  The wound is on the right leg.    He has a new problem of having vivid dreams and what he is almost referring to as hallucinations when he is sleeping out in his chair taking a nap during the day.  He will have these recurrent visions of falling out of the chair.  They can be quite terrifying for him.  He is not actually falling from the chair.  He does have sleep apnea and he does not use his CPAP when he is napping out in his chair and he is only uses at night in his bedroom.       Review of Systems  Per HPI    Objective:         ? acetaminophen (TYLENOL) 325 mg tablet Take two tablets by mouth every 6 hours as needed for Pain.   ? albuterol 0.083% (PROVENTIL) 2.5 mg /3 mL (0.083 %) nebulizer solution Inhale 3 mL solution by nebulizer as directed every 6 hours as needed for Wheezing or Shortness of Breath. Indications: asthma   ? albuterol sulfate (PROAIR HFA) 90 mcg/actuation HFA aerosol inhaler Inhale two puffs by mouth into the lungs every 4 hours as needed for Wheezing or  Shortness of Breath.   ? allopurinoL (ZYLOPRIM) 300 mg tablet Take one tablet by mouth daily. take with food   ? amitr-gabapen-emu oil 05-24-08% topical cream (COMPOUND) Apply to affected areas twice daily as needed.   ? arformoteroL (BROVANA) 15 mcg/2 mL nebulizer solution Inhale 2 mL solution by nebulizer as directed twice daily. Dx: J44.9   ? ASCORBIC ACID-ASCORBATE SODIUM PO Take 500 mg by mouth every morning.   ? atorvastatin (LIPITOR) 40 mg tablet Take 1 tablet by mouth once daily   ? azelastine (ASTELIN) 137 mcg (0.1 %) nasal spray Apply two sprays to each nostril as directed twice daily. Use in each nostril as directed   ? benzonatate (TESSALON PERLES) 100 mg capsule Take one capsule by mouth every 8 hours.   ? budesonide (PULMICORT) 0.25 mg/2 mL nebulizer solution Inhale 2 mL solution by nebulizer as directed twice daily. Dx: J44.9   ? bumetanide (BUMEX) 1 mg tablet TAKE 2 TABLETS BY MOUTH TWICE DAILY   ? cholecalciferol (VITAMIN D-3) 1,000 units tablet Take 1,000 Units by mouth daily.   ? doxycycline hyclate (VIBRACIN) 100 mg tablet Take one tablet by mouth twice daily for 7 days.   ? dulaglutide (TRULICITY) 0.75 mg/0.5 mL injection pen Inject 0.5 mL under the skin every 7 days.   ? duloxetine DR (CYMBALTA) 60 mg capsule Take 1 capsule by mouth once daily   ? finasteride (PROSCAR) 5 mg tablet Take one tablet by mouth daily.   ? fish oil- omega 3-DHA/EPA 300/1,000 mg capsule Take 1 capsule by mouth daily.   ? flash glucose scanning reader (FREESTYLE LIBRE) reader Use as directed.   ? flash glucose sensor (FREESTYLE LIBRE 14 DAY SENSOR) sensor Type 2 diabetes  Indications: type 2 diabetes mellitus   ? gabapentin (NEURONTIN) 100 mg capsule Take four capsules by mouth twice daily.   ? insulin pen needles (disposable) (BD ULTRA-FINE MINI PEN NEEDLE) 31 gauge x 3/16 pen needle Use with insulin pens   ? ipratropium bromide (ATROVENT) 21 mcg (0.03 %) nasal spray Apply two sprays to each nostril as directed every 12 hours.   ? JARDIANCE 25 mg tablet Take 1 tablet by mouth once daily   ? levoFLOXacin (LEVAQUIN) 750 mg tablet Take one tablet by mouth daily.   ? magnesium oxide 400 mg magnesium capsule Take one capsule by mouth twice daily.   ? metOLazone (ZAROXOLYN) 2.5 mg tablet Take one tablet by mouth daily as needed. PER OUTPATIENT CARDIOLOGY PROVIDER AS NEEDED FOR FLUID RETENTION   ? Miscellaneous Medical Supply misc itted for LE gradual compression stockings at a medical supply store   ? Miscellaneous Medical Supply misc Vancomycin 1,500 mg IV every 24hours starting 06/25/20 x 4 days (14 total antibiotic days), please draw vancomycin level per your protocol at [facility].   ? Miscellaneous Medical Supply misc Rx: Please wrap legs with ACE bandage from toes as high up to the leg as possible bilaterally  Dx: Lymphedema   ? montelukast (SINGULAIR) 10 mg tablet Take one tablet by mouth at bedtime daily.   ? nitroglycerin (NITROSTAT) 0.4 mg tablet Place 0.4 mg under tongue every 5 minutes as needed for Chest Pain. Max of 3 tablets, call 911.   ? nystatin (MYCOSTATIN) 100,000 unit/g topical cream Apply  topically to affected area twice daily.   ? nystatin (MYCOSTATIN) 100,000 unit/g topical ointment Apply to affected areas TID   ? nystatin (NYSTOP) 100,000 unit/g topical powder Apply  topically to affected area  four times daily.   ? pantoprazole DR (PROTONIX) 40 mg tablet Take one tablet by mouth twice daily.   ? polyethylene glycol 3350 (MIRALAX) 17 g packet Take two packets by mouth twice daily.   ? predniSONE (DELTASONE) 10 mg tablet Take two tablets by mouth daily for 3 days, THEN one tablet daily for 3 days, THEN one-half tablet daily for 3 days.   ? predniSONE (DELTASONE) 20 mg tablet Take two tablets by mouth daily. For 5 days   ? rivaroxaban (XARELTO) 20 mg tablet Take one tablet by mouth daily with dinner. Take with food.   ? sodium chloride (HYPER-SAL) 3.5 % inhalation solution Inhale 4 mL by mouth into the lungs twice daily. 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 spironolactone (ALDACTONE) 25 mg tablet Take one tablet by mouth twice daily. Take with food.   ? tamsulosin (FLOMAX) 0.4 mg capsule Take one capsule by mouth daily. Do not crush, chew or open capsules. Take 30 minutes following the same meal each day.   ? traMADoL (ULTRAM) 50 mg tablet Take one tablet by mouth every 8 hours as needed for Pain.   ? zinc sulfate 220 mg (50 mg elemental zinc) capsule Take 220 mg by mouth daily.     There were no vitals filed for this visit.    There is no height or weight on file to calculate BMI.     Physical Exam  Constitutional:       Appearance: Normal appearance.   HENT:      Head: Normocephalic and atraumatic.   Pulmonary:      Effort: Pulmonary effort is normal.   Musculoskeletal:         General: Swelling present.      Right lower leg: Edema present.      Left lower leg: No edema.   Skin:     Findings: Erythema present.      Comments: Shallow open wound on right lower ext. liposclerosis and hemosiderin changes. He does have some mild surrounding erythema around the wound.    Neurological:      General: No focal deficit present.      Mental Status: He is alert.   Psychiatric:         Mood and Affect: Mood normal.         Labwork reviewed:  Lab Results   Component Value Date/Time    HGBA1C 6.7 (H) 04/04/2020 05:40 AM    HGBA1C 5.9 11/25/2019 02:45 PM    HGBA1C 5.9 09/02/2019 09:50 AM    A1C 6.1 (A) 12/06/2020 12:00 AM    HGBPOC 14.3 04/08/2020 06:58 AM    HGBPOC 11.2 (L) 10/25/2012 10:58 AM    HGBPOC 12.9 (L) 10/25/2012 08:55 AM    MCALBR 7.2 05/18/2020 01:45 PM    TSH 2.49 03/05/2020 08:40 PM    FREET4R 0.99 12/15/2014 09:12 AM    CHOL 161 04/04/2020 05:40 AM    TRIG 152 (H) 04/04/2020 05:40 AM    HDL 53 04/04/2020 05:40 AM    LDL 87 04/04/2020 05:40 AM    NA 136 (L) 12/21/2020 01:05 PM    K 4.2 12/21/2020 01:05 PM    CL 96 (L) 12/21/2020 01:05 PM    CO2 31 (H) 12/21/2020 01:05 PM    GAP 9 12/21/2020 01:05 PM    BUN 31 (H) 12/21/2020 01:05 PM    CR 1.65 (H) 12/21/2020 01:05 PM    GLU 100 12/21/2020 01:05 PM    CA 9.1 12/21/2020  01:05 PM    PO4 4.1 03/08/2020 03:51 AM    ALBUMIN 3.8 06/15/2020 02:50 PM    TOTPROT 6.0 06/15/2020 02:50 PM    ALKPHOS 67 06/15/2020 02:50 PM    AST 13 06/15/2020 02:50 PM    ALT 9 06/15/2020 02:50 PM    TOTBILI 0.8 06/15/2020 02:50 PM    GFR 41 09/27/2020 12:00 AM    GFRAA >60 12/03/2019 04:51 AM    PSA 2.96 11/25/2018 12:00 AM              Assessment and Plan:    1. Acute idiopathic gout of right knee    2. 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 OSA on CPAP    4. Wound cellulitis      He is here today with right knee swelling and pain.  I think this is a gout attack.  We have ruled out a DVT.  He has no other evidence of decompensated heart failure.  The sudden onset would fit with a acute gout attack and this does feel similar to his previous attacks.  We are limited in our treatment given his CKD, so we are treating him with a prednisone taper.  I want him to continue on the 40 mg of prednisone x5 days and then we will start to taper this by doing 20 mg daily x3 days followed by 10 mg daily x3 days followed by 5 mg daily x3 days.  I want him to continue on the allopurinol.  I advised that he continue to elevate that leg is much as possible.    He does have a wound on his right lower extremity.  There may be some surrounding erythema concerning for cellulitis.  It is difficult to fully assess this on a telehealth format, but he does have a lot of serous drainage coming from the wound that I can appreciate.  I want him to continue on the doxycycline for a full 7 days.    As far as these episodes of feeling like he is falling when he is in his chair napping, I think that this could be related to his sleep apnea.  He is not using the CPAP and he is taking the naps.  I want him to start by using the CPAP regularly even when he is taking naps.    Total of 30 minutes were spent on the same day of the visit including preparing to see the patient, obtaining and/or reviewing separately obtained history, performing a medically appropriate examination and/or evaluation, counseling and educating the patient/family/caregiver, ordering medications, tests, or procedures, referring and communication with other health care professionals, documenting clinical information in the electronic or other health record, independently interpreting results and communicating results to the patient/family/caregiver, and care coordination.       There are no Patient Instructions on file for this visit.    No follow-ups on file.

## 2021-03-17 ENCOUNTER — Encounter: Admit: 2021-03-17 | Discharge: 2021-03-17 | Payer: MEDICARE

## 2021-03-17 NOTE — Telephone Encounter
Received call from [facility] with [facility] requesting call back regarding needing a diagnosis on an order.  Returned call. Needing new copy of lab orders for CMP and Uric Acid with diagnosis on it.  Faxed orders/records to [phone].    Garnette Scheuermann, RN

## 2021-03-18 ENCOUNTER — Ambulatory Visit: Admit: 2021-03-18 | Discharge: 2021-03-19 | Payer: MEDICARE

## 2021-03-18 ENCOUNTER — Encounter: Admit: 2021-03-18 | Discharge: 2021-03-18 | Payer: MEDICARE

## 2021-03-18 DIAGNOSIS — J479 Bronchiectasis, uncomplicated: Secondary | ICD-10-CM

## 2021-03-18 DIAGNOSIS — J471 Bronchiectasis with (acute) exacerbation: Secondary | ICD-10-CM

## 2021-03-18 DIAGNOSIS — L03116 Cellulitis of left lower limb: Secondary | ICD-10-CM

## 2021-03-18 DIAGNOSIS — K219 Gastro-esophageal reflux disease without esophagitis: Secondary | ICD-10-CM

## 2021-03-18 DIAGNOSIS — I10 Essential (primary) hypertension: Secondary | ICD-10-CM

## 2021-03-18 DIAGNOSIS — U071 Pneumonia due to COVID-19 virus: Secondary | ICD-10-CM

## 2021-03-18 DIAGNOSIS — D509 Iron deficiency anemia, unspecified: Secondary | ICD-10-CM

## 2021-03-18 DIAGNOSIS — I429 Cardiomyopathy, unspecified: Secondary | ICD-10-CM

## 2021-03-18 DIAGNOSIS — M961 Postlaminectomy syndrome, not elsewhere classified: Secondary | ICD-10-CM

## 2021-03-18 DIAGNOSIS — N401 Enlarged prostate with lower urinary tract symptoms: Secondary | ICD-10-CM

## 2021-03-18 DIAGNOSIS — J4550 Severe persistent asthma, uncomplicated: Secondary | ICD-10-CM

## 2021-03-18 DIAGNOSIS — J189 Pneumonia, unspecified organism: Secondary | ICD-10-CM

## 2021-03-18 DIAGNOSIS — N183 CKD (chronic kidney disease) stage 3, GFR 30-59 ml/min (HCC): Secondary | ICD-10-CM

## 2021-03-18 DIAGNOSIS — J438 Other emphysema: Secondary | ICD-10-CM

## 2021-03-18 DIAGNOSIS — I509 Heart failure, unspecified: Secondary | ICD-10-CM

## 2021-03-18 DIAGNOSIS — J302 Other seasonal allergic rhinitis: Secondary | ICD-10-CM

## 2021-03-18 DIAGNOSIS — I4891 Unspecified atrial fibrillation: Secondary | ICD-10-CM

## 2021-03-18 DIAGNOSIS — G4733 Obstructive sleep apnea (adult) (pediatric): Secondary | ICD-10-CM

## 2021-03-18 DIAGNOSIS — I2510 Atherosclerotic heart disease of native coronary artery without angina pectoris: Secondary | ICD-10-CM

## 2021-03-18 DIAGNOSIS — R609 Edema, unspecified: Secondary | ICD-10-CM

## 2021-03-18 DIAGNOSIS — M954 Acquired deformity of chest and rib: Secondary | ICD-10-CM

## 2021-03-18 DIAGNOSIS — J45909 Unspecified asthma, uncomplicated: Secondary | ICD-10-CM

## 2021-03-18 DIAGNOSIS — Z9981 Dependence on supplemental oxygen: Secondary | ICD-10-CM

## 2021-03-18 DIAGNOSIS — E119 Type 2 diabetes mellitus without complications: Secondary | ICD-10-CM

## 2021-03-18 DIAGNOSIS — I5042 Chronic combined systolic (congestive) and diastolic (congestive) heart failure: Secondary | ICD-10-CM

## 2021-03-18 DIAGNOSIS — J449 Chronic obstructive pulmonary disease, unspecified: Secondary | ICD-10-CM

## 2021-03-18 DIAGNOSIS — I714 AAA (abdominal aortic aneurysm): Secondary | ICD-10-CM

## 2021-03-18 DIAGNOSIS — R0600 Dyspnea, unspecified: Secondary | ICD-10-CM

## 2021-03-18 DIAGNOSIS — Z8614 Personal history of Methicillin resistant Staphylococcus aureus infection: Secondary | ICD-10-CM

## 2021-03-18 DIAGNOSIS — Z8679 Personal history of other diseases of the circulatory system: Secondary | ICD-10-CM

## 2021-03-18 DIAGNOSIS — Z95828 Presence of other vascular implants and grafts: Secondary | ICD-10-CM

## 2021-03-18 DIAGNOSIS — R053 Chronic cough: Secondary | ICD-10-CM

## 2021-03-18 DIAGNOSIS — E782 Mixed hyperlipidemia: Secondary | ICD-10-CM

## 2021-03-18 MED ORDER — ARFORMOTEROL 15 MCG/2 ML IN NEBU
15 ug | Freq: Two times a day (BID) | RESPIRATORY_TRACT | 11 refills | Status: DC
Start: 2021-03-18 — End: 2021-03-18
  Filled 2021-03-28: qty 120, 30d supply, fill #1

## 2021-03-18 MED ORDER — BUDESONIDE 0.25 MG/2 ML IN NBSP
0.25 mg | Freq: Two times a day (BID) | RESPIRATORY_TRACT | 11 refills | 30.00000 days | Status: AC
Start: 2021-03-18 — End: ?
  Filled 2021-03-28: qty 120, 30d supply, fill #1

## 2021-03-18 MED ORDER — BUDESONIDE 0.25 MG/2 ML IN NBSP
0.25 mg | Freq: Two times a day (BID) | RESPIRATORY_TRACT | 11 refills | 30.00000 days | Status: DC
Start: 2021-03-18 — End: 2021-03-18

## 2021-03-18 MED ORDER — SODIUM CHLORIDE 3.5 % IN NEBU
4 mL | Freq: Two times a day (BID) | RESPIRATORY_TRACT | 11 refills | 30.00000 days | Status: AC
Start: 2021-03-18 — End: ?
  Filled 2021-03-23: qty 240, 30d supply, fill #1

## 2021-03-18 MED ORDER — ARFORMOTEROL 15 MCG/2 ML IN NEBU
15 ug | Freq: Two times a day (BID) | RESPIRATORY_TRACT | 11 refills | Status: AC
Start: 2021-03-18 — End: ?

## 2021-03-18 MED ORDER — SODIUM CHLORIDE 3.5 % IN NEBU
4 mL | Freq: Two times a day (BID) | RESPIRATORY_TRACT | 11 refills | 30.00000 days | Status: DC
Start: 2021-03-18 — End: 2021-03-18

## 2021-03-20 ENCOUNTER — Encounter: Admit: 2021-03-20 | Discharge: 2021-03-20 | Payer: MEDICARE

## 2021-03-21 ENCOUNTER — Encounter: Admit: 2021-03-21 | Discharge: 2021-03-21 | Payer: MEDICARE

## 2021-03-21 ENCOUNTER — Ambulatory Visit: Admit: 2021-03-21 | Discharge: 2021-03-22 | Payer: MEDICARE

## 2021-03-21 DIAGNOSIS — R197 Diarrhea, unspecified: Secondary | ICD-10-CM

## 2021-03-21 DIAGNOSIS — J189 Pneumonia, unspecified organism: Secondary | ICD-10-CM

## 2021-03-21 DIAGNOSIS — M961 Postlaminectomy syndrome, not elsewhere classified: Secondary | ICD-10-CM

## 2021-03-21 DIAGNOSIS — I429 Cardiomyopathy, unspecified: Secondary | ICD-10-CM

## 2021-03-21 DIAGNOSIS — L03116 Cellulitis of left lower limb: Secondary | ICD-10-CM

## 2021-03-21 DIAGNOSIS — N401 Enlarged prostate with lower urinary tract symptoms: Secondary | ICD-10-CM

## 2021-03-21 DIAGNOSIS — M954 Acquired deformity of chest and rib: Secondary | ICD-10-CM

## 2021-03-21 DIAGNOSIS — R0600 Dyspnea, unspecified: Secondary | ICD-10-CM

## 2021-03-21 DIAGNOSIS — I4891 Unspecified atrial fibrillation: Secondary | ICD-10-CM

## 2021-03-21 DIAGNOSIS — U071 Pneumonia due to COVID-19 virus: Secondary | ICD-10-CM

## 2021-03-21 DIAGNOSIS — N183 Stage 3 chronic kidney disease, unspecified whether stage 3a or 3b CKD (HCC): Secondary | ICD-10-CM

## 2021-03-21 DIAGNOSIS — E119 Type 2 diabetes mellitus without complications: Secondary | ICD-10-CM

## 2021-03-21 DIAGNOSIS — Z9981 Dependence on supplemental oxygen: Secondary | ICD-10-CM

## 2021-03-21 DIAGNOSIS — M10061 Idiopathic gout, right knee: Secondary | ICD-10-CM

## 2021-03-21 DIAGNOSIS — I10 Essential (primary) hypertension: Secondary | ICD-10-CM

## 2021-03-21 DIAGNOSIS — J479 Bronchiectasis, uncomplicated: Secondary | ICD-10-CM

## 2021-03-21 DIAGNOSIS — Z95828 Presence of other vascular implants and grafts: Secondary | ICD-10-CM

## 2021-03-21 DIAGNOSIS — Z8614 Personal history of Methicillin resistant Staphylococcus aureus infection: Secondary | ICD-10-CM

## 2021-03-21 DIAGNOSIS — Z8679 Personal history of other diseases of the circulatory system: Secondary | ICD-10-CM

## 2021-03-21 DIAGNOSIS — I714 AAA (abdominal aortic aneurysm): Secondary | ICD-10-CM

## 2021-03-21 DIAGNOSIS — R053 Chronic cough: Secondary | ICD-10-CM

## 2021-03-21 DIAGNOSIS — J45909 Unspecified asthma, uncomplicated: Secondary | ICD-10-CM

## 2021-03-21 DIAGNOSIS — I5042 Chronic combined systolic (congestive) and diastolic (congestive) heart failure: Secondary | ICD-10-CM

## 2021-03-21 DIAGNOSIS — K219 Gastro-esophageal reflux disease without esophagitis: Secondary | ICD-10-CM

## 2021-03-21 DIAGNOSIS — G4733 Obstructive sleep apnea (adult) (pediatric): Secondary | ICD-10-CM

## 2021-03-21 DIAGNOSIS — J449 Chronic obstructive pulmonary disease, unspecified: Secondary | ICD-10-CM

## 2021-03-21 DIAGNOSIS — I2510 Atherosclerotic heart disease of native coronary artery without angina pectoris: Secondary | ICD-10-CM

## 2021-03-21 DIAGNOSIS — I509 Heart failure, unspecified: Secondary | ICD-10-CM

## 2021-03-21 DIAGNOSIS — L0390 Cellulitis, unspecified: Secondary | ICD-10-CM

## 2021-03-21 DIAGNOSIS — J302 Other seasonal allergic rhinitis: Secondary | ICD-10-CM

## 2021-03-21 DIAGNOSIS — R609 Edema, unspecified: Secondary | ICD-10-CM

## 2021-03-21 DIAGNOSIS — E782 Mixed hyperlipidemia: Secondary | ICD-10-CM

## 2021-03-21 DIAGNOSIS — D509 Iron deficiency anemia, unspecified: Secondary | ICD-10-CM

## 2021-03-21 MED ORDER — PREDNISONE 20 MG PO TAB
40 mg | ORAL_TABLET | Freq: Every day | ORAL | 0 refills | Status: AC
Start: 2021-03-21 — End: ?

## 2021-03-21 MED ORDER — PREDNISONE 10 MG PO TAB
ORAL_TABLET | Freq: Every day | ORAL | 0 refills | Status: AC
Start: 2021-03-21 — End: ?

## 2021-03-21 MED ORDER — TRAMADOL 50 MG PO TAB
50 mg | ORAL_TABLET | ORAL | 0 refills | Status: AC | PRN
Start: 2021-03-21 — End: ?

## 2021-03-21 NOTE — Telephone Encounter
Arneta Cliche, MD  [facility] Nurse [facility] Team [place] 52 minutes ago (11:53 AM)     no changes for now              Called and discussed above recommendations from Dr. Sherryll Burger with patient. He verbalizes understanding. He has reached out to Dr. Crecencio Mc, PCP regarding his current symptoms. Pt had no questions or concerns during call.

## 2021-03-21 NOTE — Telephone Encounter
I gave him tramadol to help with the pain. I do not recommend any further sedating medication or more medications that could worsen his kidney function. Hopefully treating his knee pain and slowing down his stools will help alleviate anxiety.

## 2021-03-21 NOTE — Progress Notes
Date of Service: 03/21/2021  MRN#: [id number]  DOB: December 11, 1940  PCP: Comfort, [name]    Subjective:   Chief Complaint   Patient presents with   ? Abdominal pain   ? Skin Problem     History of Present Illness  Ruben Wood. is a 81 y.o. male patient who presents for an urgent visit.    1 - Developed fecal incontinence while on doxycycline which has persisted and is currently quite distressing. Sometimes he is unaware he has had a BM and so has developed raw perianal skin. His wife is applying A&D ointment, destin, and nystatin cream. No open wounds. Bowel consistency is described as soft and formed. Denies mucus, liquid BM, abdominal pain, fever. They are not sure on bowel frequency as it is difficult to assess with the incontinence. They estimate 2-3 in the last 24 hours.     2 - Right knee gout attack - he was prescribed a prednisone burst 1/17 followed by a taper starting 1/25 that should end 2/3. He today reports he has already completed the taper. He is in such severe pain that he cannot get out of his recliner. He was seen in a local ED a couple weeks ago and reports they did not treat him so he will not go back. A DVT was ruled out at that time. He requests tramadol.     3. RLE wound 2/2 a fall a couple weeks ago. He completed a 7 day course of doxycycline for this 5 days ago. He has not had any fevers, chills, worsening erythema, or streaking redness. Wife has been applying neosporin and reports water like drainage has continued.        Medical History:   Diagnosis Date   ? AAA (abdominal aortic aneurysm) 10/15/2012   ? Asthma    ? Atrial fibrillation (HCC)    ? BPH with obstruction/lower urinary tract symptoms    ? Bronchiectasis (HCC)    ? CAD (coronary artery disease), native coronary artery 08/15/2012    07/11/2002- Cath @ [facility] showed Severe double vessel disease(OMB of circumflex and distal circ)  inferior-basilar dyskinesis.                 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  09/12/12   Cath    ? Cardiomyopathy (HCC) 07/19/2014   ? Cellulitis of left lower extremity 11/12/2014    10/2014: admitted with cellulitis, [place] negative for venous clot, MRI without osteomyelitis.    11/12/2014 In clinic today, still with cellulitis.  Per patient and his wife, the erythema may be extending.  Cultures from drainage in hospital with MSSA, but Blood Cx negative.  No e/o osteomyelitis.  My concern is that this antibiotic regimen is not adequately treating his cellulitis.  We will broaden coverage to get MRSA with doxycycline.  Patient and wife given strict call/return criteria.  I will see patient in 3-4 days for re-evaluation.  No fever today.  Plan:  Stop cefpodoxime and start doxycylcine  11/17/2014 Much better, no warmth and erythema minimal.   Plan: continue doxycyline for full 10 day course.      ? Chest wall deformity 07/09/2012    Chest wall reconstruction on 07/09/12    ? CHF (congestive heart failure) (HCC)    ? 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 Chronic cough 08/29/2012   ? 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 COPD (chronic obstructive pulmonary disease) (HCC) 08/13/2012   ? Diabetes (HCC)    ? Dyspnea    ? Edema    ? GERD (gastroesophageal reflux disease) 08/13/2012   ? History of CHF (congestive heart failure) 08/13/2012   ? History of MRSA infection 10/14/2012   ? 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    ? Hypertension 08/13/2012   ? IDA (iron deficiency anemia)    ? 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 Morbid obesity (HCC) 08/13/2012   ? On supplemental oxygen therapy    ? OSA on CPAP 08/13/2012   ? Pneumonia 04/2012    [facility]   ? Pneumonia due to COVID-19 virus 12/05/2018   ? Seasonal allergic reaction      Surgical History:   Procedure Laterality Date   ? LUNG SURGERY  2015    Bio bridge in left lung/rib cage was broken  from coughing   ? BACK SURGERY  Jan 2016   ? Right Heart Catheterization Right 11/06/2014    Performed by Cath, Physician at [facility] CATH LAB   ? CARDIAC DEFIBRILLATOR PLACEMENT  2017   ? Right Heart Catheterization With Insertion Pulmonary Artery Sensor Right 03/30/2015    Performed by Harley Alto, MD at [facility] CATH LAB   ? Insert CRT-D and Leads Right 11/25/2015    Performed by Deniece Ree, MD at [facility] EP LAB   ? Fluoroscopy N/A 11/25/2015    Performed by Deniece Ree, MD at [facility] EP LAB   ? Defibrillation Threshold Testing at ICD Implant N/A 11/25/2015    Performed by Deniece Ree, MD at [facility] EP LAB   ? VARICOSE VEIN SURGERY Left 01/05/2016    left small saphenous endovenous ablation with left leg microphlebectomy-Dr. Junita Push   ? HX MICROPHLEBECTOMY Right 07/06/2016    [facility]   ? HX ENDOVENOUS ABLATION OF THE SMALL SAPHENOUS VEIN Right 07/06/2016    [facility]   ? ANGIOGRAPHY CORONARY ARTERY WITH LEFT HEART CATHETERIZATION N/A 04/02/2017    Performed by Nat Math, MD at [facility] CATH LAB   ? PERCUTANEOUS CORONARY STENT PLACEMENT WITH ANGIOPLASTY N/A 04/02/2017    Performed by Nat Math, MD at [facility] CATH LAB   ? PERCUTANEOUS CORONARY STENT PLACEMENT WITH ANGIOPLASTY N/A 06/29/2017    Performed by Nat Math, MD at [facility] CATH LAB   ? LEFT ULNAR NERVE DECOMPRESSION AT ELBOW Left 04/30/2018    Performed by Letitia Neri, MD at [facility] OR   ? REMOVAL AND REPLACEMENT IMPLANTABLE DEFIBRILLATOR GENERATOR - MULTIPLE LEAD SYSTEM Right 11/24/2020    Performed by Annamarie Dawley, MD at [facility] EP LAB   ? BRONCHOSCOPY     ? CARDIAC CATHERIZATION  several years ago    [place]   ? DOPPLER ECHOCARDIOGRAPHY     ? HERNIA REPAIR     ? HX CHOLECYSTECTOMY     ?  HX HEART CATHETERIZATION     ? HX LUMBAR LAMINECTOMY     ? HX PACEMAKER PLACEMENT       Social History     Tobacco Use   ? Smoking status: Former     Packs/day: 2.00     Years: 40.00     Pack years: 80.00     Types: Cigarettes     Quit date: 07/09/1998     Years since quitting: 22.7   ? Smokeless tobacco: Never   Substance Use Topics   ? Alcohol use: Yes     Comment: occasion     Social History     Socioeconomic History   ? Marital status: Married     Spouse name: [name]   ? Number of children: 0   Occupational History   ? Occupation: former Therapist, music: RETIRED     Comment: lives in a 82 year old house with wife   Tobacco Use   ? Smoking status: Former     Packs/day: 2.00     Years: 40.00     Pack years: 80.00     Types: Cigarettes     Quit date: 07/09/1998     Years since quitting: 22.7   ? Smokeless tobacco: Never   Vaping Use   ? Vaping Use: Never used   Substance and Sexual Activity   ? Alcohol use: Yes     Comment: occasion   ? Drug use: Never   ? Sexual activity: Not Currently     family history includes Cancer in his father and mother; Coronary Artery Disease in his father; None Reported in his sister; Stroke in his sister.  Depression Screening:  PHQ-2: PHQ-2 Score: 0 (03/18/2021 10:28 AM)  PHQ-9: No data recorded  Interventions:  PHQ-2: PHQ-2 Score less than 3: No follow-up or recommendations are necessary at this time (09/20/2020 11:00 AM)  PHQ-2/9: Interventions: No follow-up or recommendations are necessary at this time (04/29/2020 11:53 AM)    Review of Systems     Objective:     ? acetaminophen (TYLENOL) 325 mg tablet Take two tablets by mouth every 6 hours as needed for Pain. ? albuterol 0.083% (PROVENTIL) 2.5 mg /3 mL (0.083 %) nebulizer solution Inhale 3 mL solution by nebulizer as directed every 6 hours as needed for Wheezing or Shortness of Breath. Indications: asthma   ? albuterol sulfate (PROAIR HFA) 90 mcg/actuation HFA aerosol inhaler Inhale two puffs by mouth into the lungs every 4 hours as needed for Wheezing or Shortness of Breath.   ? allopurinoL (ZYLOPRIM) 300 mg tablet Take one tablet by mouth daily. take with food   ? amitr-gabapen-emu oil 05-24-08% topical cream (COMPOUND) Apply to affected areas twice daily as needed.   ? arformoteroL (BROVANA) 15 mcg/2 mL nebulizer solution Inhale 2 mL solution by nebulizer as directed twice daily. Dx: J44.9   ? ASCORBIC ACID-ASCORBATE SODIUM PO Take 500 mg by mouth every morning.   ? atorvastatin (LIPITOR) 40 mg tablet Take 1 tablet by mouth once daily   ? azelastine (ASTELIN) 137 mcg (0.1 %) nasal spray Apply two sprays to each nostril as directed twice daily. Use in each nostril as directed   ? benzonatate (TESSALON PERLES) 100 mg capsule Take one capsule by mouth every 8 hours.   ? budesonide (PULMICORT) 0.25 mg/2 mL nebulizer solution Inhale 2 mL solution by nebulizer as directed twice daily. Dx: J44.9   ? bumetanide (BUMEX) 1 mg tablet TAKE  2 TABLETS BY MOUTH TWICE DAILY   ? cholecalciferol (VITAMIN D-3) 1,000 units tablet Take 1,000 Units by mouth daily.   ? dulaglutide (TRULICITY) 0.75 mg/0.5 mL injection pen Inject 0.5 mL under the skin every 7 days.   ? duloxetine DR (CYMBALTA) 60 mg capsule Take 1 capsule by mouth once daily   ? finasteride (PROSCAR) 5 mg tablet Take one tablet by mouth daily.   ? fish oil- omega 3-DHA/EPA 300/1,000 mg capsule Take 1 capsule by mouth daily.   ? flash glucose scanning reader (FREESTYLE LIBRE) reader Use as directed.   ? flash glucose sensor (FREESTYLE LIBRE 14 DAY SENSOR) sensor Type 2 diabetes  Indications: type 2 diabetes mellitus   ? gabapentin (NEURONTIN) 100 mg capsule Take four capsules by mouth twice daily.   ? insulin pen needles (disposable) (BD ULTRA-FINE MINI PEN NEEDLE) 31 gauge x 3/16 pen needle Use with insulin pens   ? ipratropium bromide (ATROVENT) 21 mcg (0.03 %) nasal spray Apply two sprays to each nostril as directed every 12 hours.   ? JARDIANCE 25 mg tablet Take 1 tablet by mouth once daily   ? levoFLOXacin (LEVAQUIN) 750 mg tablet Take one tablet by mouth daily.   ? magnesium oxide 400 mg magnesium capsule Take one capsule by mouth twice daily.   ? metOLazone (ZAROXOLYN) 2.5 mg tablet Take one tablet by mouth daily as needed. PER OUTPATIENT CARDIOLOGY PROVIDER AS NEEDED FOR FLUID RETENTION   ? Miscellaneous Medical Supply misc itted for LE gradual compression stockings at a medical supply store   ? Miscellaneous Medical Supply misc Vancomycin 1,500 mg IV every 24hours starting 06/25/20 x 4 days (14 total antibiotic days), please draw vancomycin level per your protocol at [facility]   ? Miscellaneous Medical Supply misc Rx: Please wrap legs with ACE bandage from toes as high up to the leg as possible bilaterally  Dx: Lymphedema   ? montelukast (SINGULAIR) 10 mg tablet Take one tablet by mouth at bedtime daily.   ? nitroglycerin (NITROSTAT) 0.4 mg tablet Place 0.4 mg under tongue every 5 minutes as needed for Chest Pain. Max of 3 tablets, call 911.   ? nystatin (MYCOSTATIN) 100,000 unit/g topical cream Apply  topically to affected area twice daily.   ? nystatin (MYCOSTATIN) 100,000 unit/g topical ointment Apply to affected areas TID   ? nystatin (NYSTOP) 100,000 unit/g topical powder Apply  topically to affected area four times daily.   ? pantoprazole DR (PROTONIX) 40 mg tablet Take one tablet by mouth twice daily.   ? polyethylene glycol 3350 (MIRALAX) 17 g packet Take two packets by mouth twice daily.   ? [START ON 03/26/2021] predniSONE (DELTASONE) 10 mg tablet Take two tablets by mouth daily with breakfast for 3 days, THEN one tablet daily with breakfast for 3 days, THEN one-half tablet daily with breakfast for 3 days. Start taper after completing 1st RX 03/26/21   ? predniSONE (DELTASONE) 20 mg tablet Take two tablets by mouth daily for 5 days. For 5 days   ? rivaroxaban (XARELTO) 20 mg tablet Take one tablet by mouth daily with dinner. Take with food.   ? sodium chloride (HYPER-SAL) 3.5 % inhalation solution Inhale 4 mL by mouth into the lungs twice daily. 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 spironolactone (ALDACTONE) 25 mg tablet Take one tablet by mouth twice daily. Take with food.   ? tamsulosin (FLOMAX) 0.4 mg capsule Take one capsule by mouth daily. Do not crush, chew or open  capsules. Take 30 minutes following the same meal each day.   ? traMADoL (ULTRAM) 50 mg tablet Take one tablet by mouth every 8 hours as needed for Pain.   ? zinc sulfate 220 mg (50 mg elemental zinc) capsule Take 220 mg by mouth daily.      BP Readings from Last 5 Encounters:   03/08/21 106/67   02/24/21 118/58   02/08/21 111/65   01/27/21 125/64   01/12/21 134/61     Wt Readings from Last 5 Encounters:   03/18/21 123.4 kg (272 lb)   03/08/21 123.4 kg (272 lb)   02/24/21 126.1 kg (278 lb)   02/08/21 127 kg (280 lb)   01/27/21 127 kg (280 lb)      Telehealth Patient Reported Vitals     Row Name 03/21/21 1333                Weight: 123.4 kg (272 lb 0.8 oz)        Height: 180.3 cm (5' 10.98)        Pain Score: TEN        Pain Location: LEG                  Telehealth Body Mass Index: 37.96 at 03/21/2021  2:48 PM    Physical Exam  Constitutional: Well developed, well nourished, no distress   HENT: Normocephalic, atraumatic  Respiratory: Normal effort, no respiratory distress  Neurologic: Alert, oriented x3   Skin: RLE lateral calf with small shallow open wound with yellow/pink base with Mild surrounding erythema. Hemosiderin changes BLE  Psych: mood /affect normal. Behavior normal. Thought content normal. Judgement normal.     Assessment and Plan:  Ruben Pavlov A. Berenice Bouton. was seen today for abdominal pain and skin problem.    Diagnoses and all orders for this visit:    Diarrhea, unspecified type  Recently completed doxycycline. He is not agreeable to completing labs to check for C.diff . Suspect abx associated diarrhea given HPI.   - Trial imodium     Acute idiopathic gout of right knee  Stage 3 chronic kidney disease, unspecified whether stage 3a or 3b CKD (HCC)  Tx limited by CKD  - Long discussion recommending him to go to ED for eval given pain so severe he cannot bear weight although patient refused to go today. He is agreeable if his pain worsens despite restarting prednisone and tramadol.   - Suspect rebound acute flare 2/2 not completing previously rx taper? He should still be on taper and he reports he has completed all doses of prednisone. Will restart a prednisone taper due to his acute pain and refilled tramadol #30 tabs.   -     predniSONE (DELTASONE) 20 mg tablet; Take two tablets by mouth daily for 5 days. For 5 days  -     predniSONE (DELTASONE) 10 mg tablet; Take two tablets by mouth daily with breakfast for 3 days, THEN one tablet daily with breakfast for 3 days, THEN one-half tablet daily with breakfast for 3 days. Start taper after completing 1st RX 03/26/21    Wound cellulitis  Keep area clean with soap and water, pat dry  Continue applying neosporin  Draw area around redness and go to Urgent care or ED if redness spreads or patient develops fever, chills, night sweats     Total of 45 minutes were spent on the same day of the visit including preparing to see the patient, obtaining and/or reviewing separately obtained  history, performing a medically appropriate examination and/or evaluation, counseling and educating the patient/family/caregiver, ordering medications, tests, or procedures, referring and communication with other health care professionals, documenting clinical information in the electronic or other health record, independently interpreting results and communicating results to the patient/family/caregiver, and care coordination. I used a dictation program when conducting this note. Due to this, there may be grammatical errors or inconsistencies. For clarification or questions, please contact me.      Future Appointments   Date Time Provider Department Center   03/24/2021  4:00 PM MAC REMOTE MONITORING MACREMOTEHRM CVM Procedur   03/28/2021 10:00 AM Gardiner Ramus, PHARMD MACHFC CVM Exam   04/25/2021  4:00 PM MAC REMOTE MONITORING MACREMOTEHRM CVM Procedur   05/04/2021  1:30 PM CVM SN ECHO 1 SNECHO CVM Procedur   05/04/2021  3:30 PM Vanetta Shawl I, MD CVMSNCL CVM Exam   05/11/2021 11:30 AM Comfort, [name], MD MPGENMED IM   05/26/2021  4:00 PM MAC REMOTE MONITORING MACREMOTEHRM CVM Procedur   06/02/2021  1:00 PM Raelyn Mora, PA-C [facility]rology   06/06/2021 10:00 AM Donnelly Stager, MD MACKUCL CVM Exam   06/06/2021 10:00 AM Macdona PACEMAKER MACKUHRM CVM Procedur   06/27/2021  4:00 PM MAC REMOTE MONITORING MACREMOTEHRM CVM Procedur     Patient Instructions     Keymani, It was nice to see you today.       You are due for these screening tests and/ or vaccines.  If you believe you have completed these items, please bring [place] these records:  Health Maintenance Due   Topic Date Due   ? DIABETES DILATED EYE EXAM  Never done   ? PHYSICAL (COMPREHENSIVE) EXAM  10/03/2018   ? ADVANCED CARE PLANNING DISCUSSION AND DOCUMENTATION  02/20/2021     To contact me: Please reach out to me directly on MyChart or call my MA, Demeka at [phone].     We offer same day appointments. Please call [phone] at 8 AM to schedule an appointment- just ask for Yelena Metzer. These appointments are on a first come, first serve basis. Please make sure the front desk has your cell phone number so you will receive appointment reminders on your cell phone via text message. At each appointment we address chronic illness management and preventative health. In order to give ample time to your concerns, please limit your agenda to 2 items.  - A telehealth video visit offers live communication between you and your provider and requires your device to have audio and video capability along with a working Internet connection.    MyChart allows you to use your mobile device or computer to communicate with your care team, see your lab results, view your after-visit summary, view your bill and make payments, schedule appointments and more. MyChart assistance and set up is available by calling [phone] or emailing [email or web address]    Medication refills: Please use the MyChart Refill request or contact your pharmacy directly to request medication refills.  Please allow 72 hours.    Walk-in Laboratory services available at:  1. [facility] (1st floor): [address] [place], [place] [address]    1st floor across from the [place] [address] parking garage   M-F: 7 AM - 6 PM, Sat: 7 AM - 12 PM, Sun: Closed  2.   [facility] (level 1): [address] [place], [place] [address]   M-F: 7 AM - 5:30 PM, Sat & Sun: 7 AM - 12 PM  3.  [facility]: [address] [place], [place] [address]   M-F: 8 AM - 5 PM, Sat & Sun: Closed  - For additional lab locations and updated hours visit [email or web address] or call [phone] (LABS)  - For fasting labs, do not eat for 8-10 hours before having your labs drawn. Please drink plenty of water, and make sure to continue your medications as prescribed.    Radiology: please call [phone] for scheduling. For x-rays, you may walk-in without an appointment     Results: If you have an active MyChart account, expect to receiveyour results and advice about your results via MyChart at [email or web address] Urgent results will be called to you. Please ensure your address and phone number are correct and that your voicemail box is set up and not full. If you are expecting results and have not heard from my office within 2 weeks of your testing, please send a MyChart message or call my office.      After hours clinics listed below offer numerous benefits:  - Providers have access to your electronic medical chart  - Appointments can be scheduled to reduce wait times and walk-ins can be accommodated   - Co-pays may be considerably less than those at Urgent Care center or an Emergency Room  - For help finding an urgent care: Call [phone] (CARE) or visit [email or web address] for details on locations, hours and wait times and alert [place] that you're on your way  - Please visit the Office Hours page at [email or web address] for a list of holiday hours.    [facility]                                    [address], [place], [place], [place] [address]  [phone]  M-F 5 p.m.-9 p.m.  Sat-Sun 8 a.m.-4 p.m.    [facility]  [address] [place], [place] [address]  [phone]  M-F 8 a.m.-9 p.m.  Sat-Sun 8 a.m.-4 p.m    Main [address]  [address] [place], [place] [address]  [facility] Level 1, [address]  [phone]  M-F 7 a.m.-9 p.m.  Sat-Sun 8 a.m.-4 p.m.      Urgent care visits are not able to handle serious symptoms, like: serious head injury, difficulty breathing, severe pain, loss of consciousness, throwing up or coughing up blood, bleeding that won't stop, severe allergic reactions that include face swelling or itching throat, seizures, chest pain, pregnancy issues, any symptoms that make it impossible to walk, breathe, or perform other basic functions. Always dial 911 in the event of an emergency or go to the nearest Emergency Room    Support for many chronic illnesses is available through [facility]: [facility]no or 979-165-1413.  Suicide Prevention Lifeline 24/7: 1-800-273-TALK 934-797-3448), En Espanol: 7154901434  Crisis Text Line 24/7: Text 606-222-1880  [facility] Mental Health 24/7: [phone]  [facility] 24/7: [phone]  [place]: [facility] 24/7: [phone]  For Tobacco Cessation: Call 1-800-Quit-Now 916 484 7439)     Take care,     Jennine Peddy     Keagon Glascoe C Maudine Kluesner, APRN-NP

## 2021-03-21 NOTE — Patient Instructions
Ruben Wood, It was nice to see you today.       You are due for these screening tests and/ or vaccines.  If you believe you have completed these items, please bring [place] these records:  Health Maintenance Due   Topic Date Due    DIABETES DILATED EYE EXAM  Never done    PHYSICAL (COMPREHENSIVE) EXAM  10/03/2018    ADVANCED CARE PLANNING DISCUSSION AND DOCUMENTATION  02/20/2021     To contact me: Please reach out to me directly on MyChart or call my MA, Demeka at [phone].     We offer same day appointments. Please call [phone] at 8 AM to schedule an appointment- just ask for Ruben Wood. These appointments are on a first come, first serve basis. Please make sure the front desk has your cell phone number so you will receive appointment reminders on your cell phone via text message. At each appointment we address chronic illness management and preventative health. In order to give ample time to your concerns, please limit your agenda to 2 items.  - A telehealth video visit offers live communication between you and your provider and requires your device to have audio and video capability along with a working Internet connection.    MyChart allows you to use your mobile device or computer to communicate with your care team, see your lab results, view your after-visit summary, view your bill and make payments, schedule appointments and more. MyChart assistance and set up is available by calling [phone] or emailing [email or web address]    Medication refills: Please use the MyChart Refill request or contact your pharmacy directly to request medication refills.  Please allow 72 hours.    Walk-in Laboratory services available at:  [facility] (1st floor): [address], [place], [place] [address]    1st floor across from the [place] [address] parking garage   M-F: 7 AM - 6 PM, Sat: 7 AM - 12 PM, Sun: Closed  2.   [facility] (level 1): [address], [place], [place] [address]   M-F: 7 AM - 5:30 PM, Sat & Sun: 7 AM - 12 PM  3. [facility]: [address] [place], [place] [address]   M-F: 8 AM - 5 PM, Sat & Sun: Closed  - For additional lab locations and updated hours visit [email or web address] or call [phone] (LABS)  - For fasting labs, do not eat for 8-10 hours before having your labs drawn. Please drink plenty of water, and make sure to continue your medications as prescribed.    Radiology: please call [phone] for scheduling. For x-rays, you may walk-in without an appointment     Results: If you have an active MyChart account, expect to receiveyour results and advice about your results via MyChart at [email or web address] Urgent results will be called to you. Please ensure your address and phone number are correct and that your voicemail box is set up and not full. If you are expecting results and have not heard from my office within 2 weeks of your testing, please send a MyChart message or call my office.      After hours clinics listed below offer numerous benefits:  - Providers have access to your electronic medical chart  - Appointments can be scheduled to reduce wait times and walk-ins can be accommodated   - Co-pays may be considerably less than those at Urgent Care center or an Emergency Room  - For help finding an urgent care: Call [phone] (CARE) or visit [email or web address]  for details on locations, hours and wait times and alert [place] that you're on your way  - Please visit the Office Hours page at [email or web address] for a list of holiday hours.    [facility]                                    [address] [place], [place], [place] [address]  [phone]  M-F 5 p.m.-9 p.m.  Sat-Sun 8 a.m.-4 p.m.    [facility]  [address], [place], [place] [address]  [phone]  M-F 8 a.m.-9 p.m.  Sat-Sun 8 a.m.-4 p.m    [facility] [address]  [address], [place], [place] [address]  [facility] Level [address], [address]  [phone]  M-F 7 a.m.-9 p.m.  Sat-Sun 8 a.m.-4 p.m.      Urgent care visits are not able to handle serious symptoms, like: serious head injury, difficulty breathing, severe pain, loss of consciousness, throwing up or coughing up blood, bleeding that won't stop, severe allergic reactions that include face swelling or itching throat, seizures, chest pain, pregnancy issues, any symptoms that make it impossible to walk, breathe, or perform other basic functions. Always dial 911 in the event of an emergency or go to the nearest Emergency Room    Support for many chronic illnesses is available through [facility]: SeekAl[facility].no or (314)104-7130.  Suicide Prevention Lifeline 24/7: 1-800-273-TALK 351-700-1748), En Espanol: 475-498-3588  Crisis Text Line 24/7: Text (805) 064-1103  [facility] 24/7: (938) 424-5305  [facility] Mental Health 24/7: [phone]  [place]: [facility] 24/7: [phone]  For Tobacco Cessation: Call 1-800-Quit-Now 910-357-1594)     Take care,     Feliza Diven

## 2021-03-22 ENCOUNTER — Encounter: Admit: 2021-03-22 | Discharge: 2021-03-22 | Payer: MEDICARE

## 2021-03-22 NOTE — Telephone Encounter
[name], Ruben  P Cvm Nurse Hf Team [place]  A Yellow Alert was reported by the device: Alert for BiV pacing < 90% over past 7 days. Overall BiV pacing since 02/24/21, 92%. Current rhythm AP-BVP 70 bpm with frequent PVCs. PVC burden since 02/24/21, 3.8%. Last BiV pacing value on trend was 85%. PVCs appear to be the cause of the low BiV pacing.       Call to pt he states no changes denies chest pain, fluttering, weight gain or SOA.  Taking all rx as prescribed med rec checked.  Update sen to Dr Dickie La

## 2021-03-23 ENCOUNTER — Encounter: Admit: 2021-03-23 | Discharge: 2021-03-23 | Payer: MEDICARE

## 2021-03-23 MED ORDER — NUCALA 100 MG/ML SC ATIN
SUBCUTANEOUS | 2 refills
Start: 2021-03-23 — End: ?

## 2021-03-23 NOTE — Telephone Encounter
Orders placed for Nucala. Patient notified and advised will follow up once approval has been received. Also informed patient first 3 doses be given in clinic and patient will have to stay and be monitored for 30 minutes after each dose in clinic. Patient verbalized understanding.

## 2021-03-23 NOTE — Telephone Encounter
-----   Message from Deveron Furlong, MD sent at 03/23/2021 12:10 PM CST -----  Regarding: Apply for Nucala  I would like to order Nucala for Mr. Ruben Wood. Severe, eosinophilic asthma with poor control. I spoke with him and his wife during our appointment on the 27th. Please take care of this and once you do let the [facility] know the medication has been ordered and we will let them know when we have approval.  Pt will need first three injections completed in clinic.  Dr. [name]

## 2021-03-24 ENCOUNTER — Encounter: Admit: 2021-03-24 | Discharge: 2021-03-24 | Payer: MEDICARE

## 2021-03-25 ENCOUNTER — Encounter: Admit: 2021-03-25 | Discharge: 2021-03-25 | Payer: MEDICARE

## 2021-03-28 ENCOUNTER — Encounter: Admit: 2021-03-28 | Discharge: 2021-03-28 | Payer: MEDICARE

## 2021-03-28 MED FILL — ALBUTEROL SULFATE 2.5 MG /3 ML (0.083 %) IN NEBU: 2.5 mg /3 mL (0.083 %) | RESPIRATORY_TRACT | 15 days supply | Qty: 180 | Fill #1 | Status: AC

## 2021-03-29 ENCOUNTER — Encounter: Admit: 2021-03-29 | Discharge: 2021-03-29 | Payer: MEDICARE

## 2021-03-29 MED FILL — TRULICITY 1.5 MG/0.5 ML SC PNIJ: 1.5 mg/0.5 mL | SUBCUTANEOUS | 28 days supply | Qty: 4 | Fill #1 | Status: AC

## 2021-03-29 NOTE — Progress Notes
The Prior Authorization for nucala has been submitted for Ruben Wood. via Cover My Meds.  Will continue to follow.    Mcneil Sober  Pharmacy Patient Advocate  [phone]

## 2021-03-30 ENCOUNTER — Encounter: Admit: 2021-03-30 | Discharge: 2021-03-30 | Payer: MEDICARE

## 2021-03-31 ENCOUNTER — Encounter: Admit: 2021-03-31 | Discharge: 2021-03-31 | Payer: MEDICARE

## 2021-03-31 NOTE — Progress Notes
The Prior Authorization for nuca was approved for Ruben Wood. from 02/20/21 to 03/29/22.  The copay is $1,212.77.      Will reach out to patient to complete assistance.     Mcneil Sober  Pharmacy Patient Advocate  [phone]

## 2021-03-31 NOTE — Progress Notes
Remote Patient Monitoring Data Review      RPM data reviewed. No alerts indicating vital signs out of protocol ranges. No need to call patient at this time.    Time Spent (minutes): 2     Remote Patient Monitoring        Some values may be hidden. Unless noted otherwise, only the newest values recorded on each date are displayed.         BP 03/23/21 03/24/21 03/26/21 03/29/21 03/30/21 03/31/21   No data to display.      Heart Rate 03/23/21 03/24/21 03/26/21 03/29/21 03/30/21 03/31/21   Heart Rate 93 bpm 103 bpm 61 bpm 93 bpm 97 bpm  94 bpm    Some values recorded on this date have been omitted.       SPO2 03/23/21 03/24/21 03/26/21 03/29/21 03/30/21 03/31/21   SPO2 96 % 97 % 97 % 97 % 96 %  95 %    Some values recorded on this date have been omitted.       Weight 03/23/21 03/24/21 03/26/21 03/29/21 03/30/21 03/31/21   No data to display.      Glucose 03/23/21 03/24/21 03/26/21 03/29/21 03/30/21 03/31/21   No data to display.

## 2021-04-04 ENCOUNTER — Encounter: Admit: 2021-04-04 | Discharge: 2021-04-04 | Payer: MEDICARE

## 2021-04-04 NOTE — Progress Notes
Contacted [name] to discuss assistance regarding their medication: nucala.  Wife states patient is inpatient and does not know when he may be discharged but may be a while. We agreed to follow up in two weeks. She asked that I please pass the message along to Dr. [name]. Routing to [employer]. Mcneil Sober  Pharmacy Patient Advocate, Specialty Pharmacy  [phone]

## 2021-04-04 NOTE — Telephone Encounter
I called patient's wife to get an update on patient.    States he's been having dizzy spells and low blood pressure with diastolic going in 30's.    They were told he would be at facility with Dr. Jason Nest for a while but he's getting good care and having PT twice a day.    They are managing UTI, blood pressure and yeast.    They will keep [place] updated.    I sent our thoughts and regards.    Etta Grandchild, RN

## 2021-04-11 ENCOUNTER — Encounter: Admit: 2021-04-11 | Discharge: 2021-04-11 | Payer: MEDICARE

## 2021-04-15 ENCOUNTER — Encounter: Admit: 2021-04-15 | Discharge: 2021-04-15 | Payer: MEDICARE

## 2021-04-15 NOTE — Progress Notes
Attempted to reach patient's wife for routine CM follow up   General message left with call back number.     Will attempt next outreach in approximately 1 month.

## 2021-04-18 ENCOUNTER — Encounter: Admit: 2021-04-18 | Discharge: 2021-04-18 | Payer: MEDICARE

## 2021-04-18 ENCOUNTER — Ambulatory Visit: Admit: 2021-04-18 | Discharge: 2021-04-18 | Payer: MEDICARE

## 2021-04-18 DIAGNOSIS — Z8614 Personal history of Methicillin resistant Staphylococcus aureus infection: Secondary | ICD-10-CM

## 2021-04-18 DIAGNOSIS — I429 Cardiomyopathy, unspecified: Secondary | ICD-10-CM

## 2021-04-18 DIAGNOSIS — Z8679 Personal history of other diseases of the circulatory system: Secondary | ICD-10-CM

## 2021-04-18 DIAGNOSIS — I509 Heart failure, unspecified: Secondary | ICD-10-CM

## 2021-04-18 DIAGNOSIS — D509 Iron deficiency anemia, unspecified: Secondary | ICD-10-CM

## 2021-04-18 DIAGNOSIS — R053 Chronic cough: Secondary | ICD-10-CM

## 2021-04-18 DIAGNOSIS — N401 Enlarged prostate with lower urinary tract symptoms: Secondary | ICD-10-CM

## 2021-04-18 DIAGNOSIS — I5042 Chronic combined systolic (congestive) and diastolic (congestive) heart failure: Secondary | ICD-10-CM

## 2021-04-18 DIAGNOSIS — J189 Pneumonia, unspecified organism: Secondary | ICD-10-CM

## 2021-04-18 DIAGNOSIS — J479 Bronchiectasis, uncomplicated: Secondary | ICD-10-CM

## 2021-04-18 DIAGNOSIS — E782 Mixed hyperlipidemia: Secondary | ICD-10-CM

## 2021-04-18 DIAGNOSIS — R0600 Dyspnea, unspecified: Secondary | ICD-10-CM

## 2021-04-18 DIAGNOSIS — I10 Essential (primary) hypertension: Secondary | ICD-10-CM

## 2021-04-18 DIAGNOSIS — E1165 Type 2 diabetes mellitus with hyperglycemia: Secondary | ICD-10-CM

## 2021-04-18 DIAGNOSIS — R609 Edema, unspecified: Secondary | ICD-10-CM

## 2021-04-18 DIAGNOSIS — I714 AAA (abdominal aortic aneurysm): Secondary | ICD-10-CM

## 2021-04-18 DIAGNOSIS — R197 Diarrhea, unspecified: Secondary | ICD-10-CM

## 2021-04-18 DIAGNOSIS — M954 Acquired deformity of chest and rib: Secondary | ICD-10-CM

## 2021-04-18 DIAGNOSIS — U071 Pneumonia due to COVID-19 virus: Secondary | ICD-10-CM

## 2021-04-18 DIAGNOSIS — J4541 Moderate persistent asthma with (acute) exacerbation: Secondary | ICD-10-CM

## 2021-04-18 DIAGNOSIS — I2510 Atherosclerotic heart disease of native coronary artery without angina pectoris: Secondary | ICD-10-CM

## 2021-04-18 DIAGNOSIS — Z9981 Dependence on supplemental oxygen: Secondary | ICD-10-CM

## 2021-04-18 DIAGNOSIS — K219 Gastro-esophageal reflux disease without esophagitis: Secondary | ICD-10-CM

## 2021-04-18 DIAGNOSIS — J9612 Chronic respiratory failure with hypercapnia: Secondary | ICD-10-CM

## 2021-04-18 DIAGNOSIS — J449 Chronic obstructive pulmonary disease, unspecified: Secondary | ICD-10-CM

## 2021-04-18 DIAGNOSIS — J302 Other seasonal allergic rhinitis: Secondary | ICD-10-CM

## 2021-04-18 DIAGNOSIS — I4891 Unspecified atrial fibrillation: Secondary | ICD-10-CM

## 2021-04-18 DIAGNOSIS — Z95828 Presence of other vascular implants and grafts: Secondary | ICD-10-CM

## 2021-04-18 DIAGNOSIS — J45909 Unspecified asthma, uncomplicated: Secondary | ICD-10-CM

## 2021-04-18 DIAGNOSIS — N183 Stage 3 chronic kidney disease, unspecified whether stage 3a or 3b CKD (HCC): Secondary | ICD-10-CM

## 2021-04-18 DIAGNOSIS — M961 Postlaminectomy syndrome, not elsewhere classified: Secondary | ICD-10-CM

## 2021-04-18 DIAGNOSIS — E119 Type 2 diabetes mellitus without complications: Secondary | ICD-10-CM

## 2021-04-18 DIAGNOSIS — L03116 Cellulitis of left lower limb: Secondary | ICD-10-CM

## 2021-04-18 DIAGNOSIS — G4733 Obstructive sleep apnea (adult) (pediatric): Secondary | ICD-10-CM

## 2021-04-18 NOTE — Patient Instructions
It was great seeing you today!    Please do not hesitate to reach out to our clinic if you have any questions or concerns.  Take Care,  Ruben Wood A Ruben Stansbery, APRN-NP      Routine Clinic Information:  How to reach Ruben Wood: Please send a MyChart message to the General Medicine clinic or call my nurse, Ruben Wood, at [phone]. When calling please leave your name (including spelling), date of birth, phone number where you can be reached and a description of why you are calling. Ruben Wood will return your call as soon as possible.  Sometimes the clinic can get very busy and we might not be able to get to your phone call until later in the day.  If you need to send a fax to our office, our fax number is [phone].    For urgent issues after business hours/weekends/holidays call [phone] and request for the outpatient internal medicine physician to be paged    We offer same day appointments for your acute health concerns. These appointments are on a first come, first serve basis. I am available for these appointments daily- just ask for Ruben Wood if you would like to be seen. Please call [phone] if you would like to make an appointment.     Lab Results: Due to the CARES act, as of April 1st, 2021 lab results automatically release to MyChart. Ruben Wood will continue to send you a result note on any labs that she orders. With these changes you may see your results before she does. Critical lab results will be addressed immediately. Please allow up to 72 hours for Ruben Wood to review and respond to your results before reaching out with any questions.    For refills on medications, please have your pharmacy fax a refill authorization request form to our office at Fax) [phone]. Please allow at least 3 business days for refill requests.

## 2021-04-18 NOTE — Progress Notes
TELEHEALTH NOTE  Date of Service: 04/18/2021    Ruben Gentil. is a 81 y.o. male. DOB: [birth year]   MRN#: [id number]    Subjective:   Chief Complaint   Patient presents with   ? Follow Up     Low bp, loose stools and increased fatigue x 2 weeks          History of Present Illness  Ruben Wood. is 81 y.o. [name] patient who presents for telehealth visit for dizziness and a hospital follow-up. He has a history of type 2 diabetes, severe asthma with bronchiectasis, peripheral vascular disease, hyperlipidemia, ischemic cardiomyopathy with ICD implantation, severely impaired LV ejection fraction, hypertension, coronary artery disease, stage III CKD, hypergammaglobulinemia and sleep apnea. He is accompanied by his wife Ruben Wood.    He last saw his cardiologist, Dr. Betti Cruz on 02/24/2021 where they reviewed his CRT-D interrogation that evidenced episodes of tachyarrhythmia that were read as NSVTs. However they actually seemed to be SVT instead.  The patient did not notice these events.  They discussed the significance of this finding and were going to monitor for further changes in his clinical status to decide if he will need additional treatment to suppress these events.     I had has been experiencing some dizziness and fatigue.  His wife notes he was admitted in [facility] for 2 weeks for a UTI and low blood pressure.  Unfortunately we do not have the records to review at this time.  It appears that his metoprolol was DC'd.  He continues to be fatigued and extremely tired.  He does get dizzy with position changes.  His blood pressure has increased and stopping the metoprolol.  Systolically he is in the 130s.  He continues to have diarrhea.  He had this right before going into the hospital.  He did have an extremely large bowel movement while in the hospital that was concerning by his nurse and his wife.  However after relieving this large stool he continued to have bouts of diarrhea.  He wakes up 2-3 times a night to urinate and incidentally has a bowel movement.  He continues to have a bowel movement 3-4 times during the day.  They deny any mucousy stool or dark or bloody stool.  He does have a decreased appetite.  They feel like something is off but not sure to what.  Ruben Wood wonders if the Trulicity could be causing his diarrhea.  His Trulicity was increased to 1.5 mg on 03/28/2021.    He also continues to have right knee pain.  This has been going on for the last couple months.  It was thought to be a gouty attack and was given prednisone but his pain did not decrease.  .    BP Readings from Last 5 Encounters:   03/08/21 106/67   02/24/21 118/58   02/08/21 111/65   01/27/21 125/64   01/12/21 134/61     Wt Readings from Last 5 Encounters:   03/18/21 123.4 kg (272 lb)   03/08/21 123.4 kg (272 lb)   02/24/21 126.1 kg (278 lb)   02/08/21 127 kg (280 lb)   01/27/21 127 kg (280 lb)       Social History     Socioeconomic History   ? Marital status: Married     Spouse name: Ruben Wood   ? Number of children: 0   Occupational History   ? Occupation: former Therapist, music:  RETIRED     Comment: lives in a 82 year old house with wife   Tobacco Use   ? Smoking status: Former     Packs/day: 2.00     Years: 40.00     Pack years: 80.00     Types: Cigarettes     Quit date: 07/09/1998     Years since quitting: 22.7   ? Smokeless tobacco: Never   Vaping Use   ? Vaping Use: Never used   Substance and Sexual Activity   ? Alcohol use: Yes     Comment: occasion   ? Drug use: Never   ? Sexual activity: Not Currently       Objective:     ? acetaminophen (TYLENOL) 325 mg tablet Take two tablets by mouth every 6 hours as needed for Pain.   ? albuterol 0.083% (PROVENTIL) 2.5 mg /3 mL (0.083 %) nebulizer solution Inhale 3 mL solution by nebulizer as directed every 6 hours as needed for Wheezing or Shortness of Breath. Indications: asthma   ? albuterol sulfate (PROAIR HFA) 90 mcg/actuation HFA aerosol inhaler Inhale two puffs by mouth into the lungs every 4 hours as needed for Wheezing or Shortness of Breath.   ? allopurinoL (ZYLOPRIM) 300 mg tablet Take one tablet by mouth daily. take with food   ? amitr-gabapen-emu oil 05-24-08% topical cream (COMPOUND) Apply to affected areas twice daily as needed.   ? arformoteroL (BROVANA) 15 mcg/2 mL nebulizer solution Inhale 2 mL solution by nebulizer as directed twice daily. Dx: J44.9   ? ASCORBIC ACID-ASCORBATE SODIUM PO Take 500 mg by mouth every morning.   ? atorvastatin (LIPITOR) 40 mg tablet Take 1 tablet by mouth once daily   ? azelastine (ASTELIN) 137 mcg (0.1 %) nasal spray Apply two sprays to each nostril as directed twice daily. Use in each nostril as directed   ? benzonatate (TESSALON PERLES) 100 mg capsule Take one capsule by mouth every 8 hours.   ? budesonide (PULMICORT) 0.25 mg/2 mL nebulizer solution Inhale 2 mL solution by nebulizer as directed twice daily. Dx: J44.9   ? bumetanide (BUMEX) 1 mg tablet TAKE 2 TABLETS BY MOUTH TWICE DAILY   ? cholecalciferol (VITAMIN D-3) 1,000 units tablet Take one tablet by mouth daily.   ? dulaglutide (TRULICITY) 1.5 mg/0.5 mL injection pen Inject 0.5 mL under the skin every 7 days.   ? duloxetine DR (CYMBALTA) 60 mg capsule Take 1 capsule by mouth once daily   ? finasteride (PROSCAR) 5 mg tablet Take one tablet by mouth daily.   ? fish oil- omega 3-DHA/EPA 300/1,000 mg capsule Take one capsule by mouth daily.   ? flash glucose scanning reader (FREESTYLE LIBRE) reader Use as directed.   ? flash glucose sensor (FREESTYLE LIBRE 14 DAY SENSOR) sensor Type 2 diabetes  Indications: type 2 diabetes mellitus   ? gabapentin (NEURONTIN) 100 mg capsule Take four capsules by mouth twice daily.   ? insulin pen needles (disposable) (BD ULTRA-FINE MINI PEN NEEDLE) 31 gauge x 3/16 pen needle Use with insulin pens   ? ipratropium bromide (ATROVENT) 21 mcg (0.03 %) nasal spray Apply two sprays to each nostril as directed every 12 hours.   ? JARDIANCE 25 mg tablet Take 1 tablet by mouth once daily   ? levoFLOXacin (LEVAQUIN) 750 mg tablet Take one tablet by mouth daily.   ? magnesium oxide 400 mg magnesium capsule Take one capsule by mouth twice daily.   ? mepolizumab (NUCALA) 100 mg/mL injection  pen Inject 100 mg under the skin every 28 days.   ? metOLazone (ZAROXOLYN) 2.5 mg tablet Take one tablet by mouth daily as needed. PER OUTPATIENT CARDIOLOGY PROVIDER AS NEEDED FOR FLUID RETENTION   ? Miscellaneous Medical Supply misc itted for LE gradual compression stockings at a medical supply store   ? Miscellaneous Medical Supply misc Vancomycin 1,500 mg IV every 24hours starting 06/25/20 x 4 days (14 total antibiotic days), please draw vancomycin level per your protocol at [facility].   ? Miscellaneous Medical Supply misc Rx: Please wrap legs with ACE bandage from toes as high up to the leg as possible bilaterally  Dx: Lymphedema   ? montelukast (SINGULAIR) 10 mg tablet Take one tablet by mouth at bedtime daily.   ? nitroglycerin (NITROSTAT) 0.4 mg tablet Place one tablet under tongue every 5 minutes as needed for Chest Pain. Max of 3 tablets, call 911.   ? nystatin (MYCOSTATIN) 100,000 unit/g topical cream Apply  topically to affected area twice daily.   ? nystatin (MYCOSTATIN) 100,000 unit/g topical ointment Apply to affected areas TID   ? nystatin (NYSTOP) 100,000 unit/g topical powder Apply  topically to affected area four times daily.   ? pantoprazole DR (PROTONIX) 40 mg tablet Take one tablet by mouth twice daily.   ? polyethylene glycol 3350 (MIRALAX) 17 g packet Take two packets by mouth twice daily.   ? rivaroxaban (XARELTO) 20 mg tablet Take one tablet by mouth daily with dinner. Take with food.   ? sodium chloride (HYPER-SAL) 3.5 % inhalation solution Inhale 4 mL by mouth into the lungs twice daily. 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 spironolactone (ALDACTONE) 25 mg tablet Take one tablet by mouth twice daily. Take with food.   ? tamsulosin (FLOMAX) 0.4 mg capsule Take one capsule by mouth daily. Do not crush, chew or open capsules. Take 30 minutes following the same meal each day.   ? traMADoL (ULTRAM) 50 mg tablet Take one tablet by mouth every 8 hours as needed for Pain.   ? zinc sulfate 220 mg (50 mg elemental zinc) capsule Take one capsule by mouth daily.     There were no vitals filed for this visit.  There is no height or weight on file to calculate BMI.     Physical Exam  HENT:      Head: Normocephalic and atraumatic.   Eyes:      Pupils: Pupils are equal, round, and reactive to light.   Pulmonary:      Comments: Wearing 4 L of oxygen  Neurological:      Mental Status: He is alert.   Psychiatric:         Mood and Affect: Mood normal.         Behavior: Behavior normal.         Thought Content: Thought content normal.         Judgment: Judgment normal.                Assessment and Plan:    Diarrhea: Multifactoral.  He was on antibiotics for recent UTI.  He had a 2-week hospitalization.  Also had an increase of Trulicity.  We will obtain stool cultures.    Dizziness: He does have some reports of orthostatic hypotension.  He has also had some runs of SVT as noted by Dr. Jae Dire recent note.  I would like him to come into the office tomorrow for complete physical.  Hopefully at that time we  will have his hospital records to review.    Chronic combined systolic and diastolic heart failure  > Has dual-chamber CRT defibrillator- by Dr. Milas Kocher  > 11/04/2014 EF 20%, severely dilated LV with grade 1 diastolic dysfunction.  > Metoprolol DC'd with recent hospital stay  > Continue spironolactone 25mg  twice daily.   > Continue Bumex 2 mg twice daily  > Continue Jardiance 25 mg daily  >Has allergy to ARB.    Coronary artery disease  > CTO of RCA with obtuse marginal branch with high-grade stenosis (90%) and mid left circumflex.  > Advised on heart healthy diet and exercise  > Continue high intensity statin, atorvastatin    Paroxysmal atrial fibrillation  > Elevated CHADS-VASc score of at least 5 (age above 40, hypertension, coronary artery disease).  >  Continue rivaroxaban20 mg daily for now. Due to his epistaxis, not a candidate for long-term anticoagulation  > He is a candidate for left atrial appendage closure    Type 2 diabetes: Goal A1c less than 7.0%  > A1c 4 months ago 6.1%   Continue Jardiance 25 mg daily  > Continue Trulicity 1 mg weekly for now.    Moderate Persistent asthma and COPD:  >  Continue oxygen supplementation  >Continue montelukast 10 mg daily  > Continue Astelin  > Continue budesonide nebulizers twice daily  > Continue Brovana 1 nebulizer twice daily  > Continue ProAir and albuterol nebulizers as needed        Ruben Pavlov A. Berenice Bouton. was seen today for follow up.    Diagnoses and all orders for this visit:    Diarrhea, unspecified type           Future Appointments   Date Time Provider Department Center   04/18/2021  9:00 AM Ruben Wood, Ruben Wood, Ruben Wood [facility] IM   04/18/2021 10:30 AM Ruben Wood, Ruben Wood [facility] CVM Exam   04/25/2021  4:00 PM [facility]ITOR[facility] CVM Procedur   05/04/2021  1:30 PM CVM SN ECHO 1 SNECHO CVM Procedur   05/04/2021  3:30 PM Vanetta Shawl I, MD [facility] CVM Exam   05/11/2021 11:30 AM [facility], [name], MD [facility]   05/26/2021  4:00 PM [facility]OTE MONITORING [facility] CVM Procedur   06/02/2021  1:00 PM Raelyn Mora, PA-C [facility]   06/06/2021 10:00 AM Donnelly Stager, MD [facility] CVM Exam   06/06/2021 10:00 AM [place] [facility] CVM Procedur   06/27/2021  4:00 PM [facility] REMOTE MONITORING [facility] CVM Procedur   09/13/2021  3:10 PM Deveron Furlong, MD [facility] IM       Ruben Wood Tannar Broker, Ruben Wood             Total of 40 minutes were spent on the same day of the visit including preparing to see the patient, obtaining and/or reviewing separately obtained history, performing a medically appropriate examination and/or evaluation, counseling and educating the patient/family/caregiver, ordering medications, tests, or procedures, referring and communication with other health care professionals, documenting clinical information in the electronic or other health record, independently interpreting results and communicating results to the patient/family/caregiver, and care coordination. I used a dictation program when conducting this note. Due to this, there may be grammatical errors or inconsistencies. For clarification or questions, please contact me.

## 2021-04-19 ENCOUNTER — Encounter: Admit: 2021-04-19 | Discharge: 2021-04-19 | Payer: MEDICARE

## 2021-04-19 ENCOUNTER — Ambulatory Visit: Admit: 2021-04-19 | Discharge: 2021-04-20 | Payer: MEDICARE

## 2021-04-19 DIAGNOSIS — E782 Mixed hyperlipidemia: Secondary | ICD-10-CM

## 2021-04-19 DIAGNOSIS — Z8614 Personal history of Methicillin resistant Staphylococcus aureus infection: Secondary | ICD-10-CM

## 2021-04-19 DIAGNOSIS — I255 Ischemic cardiomyopathy: Secondary | ICD-10-CM

## 2021-04-19 DIAGNOSIS — I10 Essential (primary) hypertension: Secondary | ICD-10-CM

## 2021-04-19 DIAGNOSIS — L03116 Cellulitis of left lower limb: Secondary | ICD-10-CM

## 2021-04-19 DIAGNOSIS — J189 Pneumonia, unspecified organism: Secondary | ICD-10-CM

## 2021-04-19 DIAGNOSIS — J479 Bronchiectasis, uncomplicated: Secondary | ICD-10-CM

## 2021-04-19 DIAGNOSIS — G4733 Obstructive sleep apnea (adult) (pediatric): Secondary | ICD-10-CM

## 2021-04-19 DIAGNOSIS — U071 Pneumonia due to COVID-19 virus: Secondary | ICD-10-CM

## 2021-04-19 DIAGNOSIS — J45909 Unspecified asthma, uncomplicated: Secondary | ICD-10-CM

## 2021-04-19 DIAGNOSIS — R0600 Dyspnea, unspecified: Secondary | ICD-10-CM

## 2021-04-19 DIAGNOSIS — M954 Acquired deformity of chest and rib: Secondary | ICD-10-CM

## 2021-04-19 DIAGNOSIS — I509 Heart failure, unspecified: Secondary | ICD-10-CM

## 2021-04-19 DIAGNOSIS — M961 Postlaminectomy syndrome, not elsewhere classified: Secondary | ICD-10-CM

## 2021-04-19 DIAGNOSIS — J302 Other seasonal allergic rhinitis: Secondary | ICD-10-CM

## 2021-04-19 DIAGNOSIS — R609 Edema, unspecified: Secondary | ICD-10-CM

## 2021-04-19 DIAGNOSIS — R053 Chronic cough: Secondary | ICD-10-CM

## 2021-04-19 DIAGNOSIS — I429 Cardiomyopathy, unspecified: Secondary | ICD-10-CM

## 2021-04-19 DIAGNOSIS — E1165 Type 2 diabetes mellitus with hyperglycemia: Secondary | ICD-10-CM

## 2021-04-19 DIAGNOSIS — J449 Chronic obstructive pulmonary disease, unspecified: Secondary | ICD-10-CM

## 2021-04-19 DIAGNOSIS — I952 Hypotension due to drugs: Secondary | ICD-10-CM

## 2021-04-19 DIAGNOSIS — Z95828 Presence of other vascular implants and grafts: Secondary | ICD-10-CM

## 2021-04-19 DIAGNOSIS — K219 Gastro-esophageal reflux disease without esophagitis: Secondary | ICD-10-CM

## 2021-04-19 DIAGNOSIS — I5042 Chronic combined systolic (congestive) and diastolic (congestive) heart failure: Secondary | ICD-10-CM

## 2021-04-19 DIAGNOSIS — R197 Diarrhea, unspecified: Secondary | ICD-10-CM

## 2021-04-19 DIAGNOSIS — I4891 Unspecified atrial fibrillation: Secondary | ICD-10-CM

## 2021-04-19 DIAGNOSIS — Z8679 Personal history of other diseases of the circulatory system: Secondary | ICD-10-CM

## 2021-04-19 DIAGNOSIS — Z95810 Presence of automatic (implantable) cardiac defibrillator: Secondary | ICD-10-CM

## 2021-04-19 DIAGNOSIS — I714 AAA (abdominal aortic aneurysm): Secondary | ICD-10-CM

## 2021-04-19 DIAGNOSIS — N401 Enlarged prostate with lower urinary tract symptoms: Secondary | ICD-10-CM

## 2021-04-19 DIAGNOSIS — E119 Type 2 diabetes mellitus without complications: Secondary | ICD-10-CM

## 2021-04-19 DIAGNOSIS — N183 CKD (chronic kidney disease) stage 3, GFR 30-59 ml/min (HCC): Secondary | ICD-10-CM

## 2021-04-19 DIAGNOSIS — I2510 Atherosclerotic heart disease of native coronary artery without angina pectoris: Secondary | ICD-10-CM

## 2021-04-19 DIAGNOSIS — Z9981 Dependence on supplemental oxygen: Secondary | ICD-10-CM

## 2021-04-19 DIAGNOSIS — D509 Iron deficiency anemia, unspecified: Secondary | ICD-10-CM

## 2021-04-19 MED ORDER — TRULICITY 0.75 MG/0.5 ML SC PNIJ
.75 mg | SUBCUTANEOUS | 3 refills | Status: AC
Start: 2021-04-19 — End: ?

## 2021-04-19 NOTE — Progress Notes
Date of Service: 04/19/2021    Ruben Wood. is a 81 y.o. male. DOB: 01-24-1941   MRN#: [id number]    Subjective:   Post-hospital Follow Up (Multiple issues since hospital leg pain buttocks pain cellulitis in leg)          History of Present Illness    History of present illness:  Ruben Wood. is 81 y.o. [name] patient who presents to clinic for dizziness.He has a history of type 2 diabetes, severe asthma with bronchiectasis, peripheral vascular disease, hyperlipidemia, ischemic cardiomyopathy with ICD implantation, severely impaired LV ejection fraction, hypertension, coronary artery disease, stage III CKD, hypergammaglobulinemia and sleep apnea. He is accompanied by his wife [name].    We saw him via telehealth yesterday but with his increase of dizziness and fatigue want him to come in today for an in person assessment. He had a 2-week hospital stay in [facility] for acute cystitis with hematuria and hypotension.  His wife also thought he may have had a touch of pneumonia.    He started to have bowel incontinence over the last 3 weeks.  He was at times well himself without knowing it.  He did not have the urge to go at times.  He has had a couple extremely large bowel movements.  Since then he has had softer bowel movements that are normal color (not dark and tarry or bright red) he was having 6-7 a day but has decreased to 3-4 on the last couple days.  Every time he goes to the bathroom he sometimes has the urge to have a bowel movement but does not go every time.  His Trulicity was increased from 1 mg to 1.5 mg on March 28, 2021.  Since the increase that his symptoms of diarrhea have started.  His last A1c was 6.1% 4 months ago.    He continues to have a decreased appetite.  He does have a clear productive cough.  He notes this is normal for him.  He does not have an increase of sputum production or shortness of air.  He continues to wear 4 L of oxygen per nasal cannula.    He does continue to have right knee pain.  He has been empirically treated for gout.  He had a round of prednisone but that did not seem to help.  He is currently taking allopurinol.  He has a wound on his right lower extremity which was on doxycycline for this.  There is minimal erythema and scant amount of drainage.  He continues to use Voltaren gel with minimal relief.  He does take tramadol and Tylenol which do seem to help.  He was receiving inpatient occupational therapy and physical therapy.  He does have outpatient physical therapy which they are planning to start soon.  I had him bring his prescription bottles in with him today.  He does have a prescription for continuous doxycycline 100 mg daily given to him by his allergist, Dr. Waynette Buttery.    He does continue to be fatigued and tired.  He is getting worn out easily.  His blood pressure is a little hypotensive today.  He does have ischemic cardiomyopathy.  With a BiV ICD.  His from 2021 showed a EF of 35% which has increased from 20% 3 years prior.        10/21 echo :impression:  moderately reduced left ventricular systolic function. LVEF 35%  Moderate left atrial enlargement.   Mild right atrial enlargement.  Mitral annular calcification with mild stenosis. Mean gradient is 3-4 mmHg at a heart rate of 61 BPM. Moderate to severe regurgitation is present.   No pericardial effusion.   Estimated peak systolic pulmonary artery pressure is 37 mmHg.     Medical History:   Diagnosis Date   ? AAA (abdominal aortic aneurysm) 10/15/2012   ? Asthma    ? Atrial fibrillation (HCC)    ? BPH with obstruction/lower urinary tract symptoms    ? Bronchiectasis (HCC)    ? CAD (coronary artery disease), native coronary artery 08/15/2012    07/11/2002- Cath @ [facility] showed Severe double vessel disease(OMB of circumflex and distal circ)  inferior-basilar dyskinesis.                 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  09/12/12   Cath    ? Cardiomyopathy (HCC) 07/19/2014   ? Cellulitis of left lower extremity 11/12/2014    10/2014: admitted with cellulitis, [place] negative for venous clot, MRI without osteomyelitis.    11/12/2014 In clinic today, still with cellulitis.  Per patient and his wife, the erythema may be extending.  Cultures from drainage in hospital with MSSA, but Blood Cx negative.  No e/o osteomyelitis.  My concern is that this antibiotic regimen is not adequately treating his cellulitis.  We will broaden coverage to get MRSA with doxycycline.  Patient and wife given strict call/return criteria.  I will see patient in 3-4 days for re-evaluation.  No fever today.  Plan:  Stop cefpodoxime and start doxycylcine  11/17/2014 Much better, no warmth and erythema minimal.   Plan: continue doxycyline for full 10 day course.      ? Chest wall deformity 07/09/2012    Chest wall reconstruction on 07/09/12    ? CHF (congestive heart failure) (HCC)    ? 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 Chronic cough 08/29/2012   ? 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 COPD (chronic obstructive pulmonary disease) (HCC) 08/13/2012   ? Diabetes (HCC)    ? Dyspnea    ? Edema    ? GERD (gastroesophageal reflux disease) 08/13/2012   ? History of CHF (congestive heart failure) 08/13/2012   ? History of MRSA infection 10/14/2012   ? 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    ? Hypertension 08/13/2012   ? IDA (iron deficiency anemia)    ? 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 Morbid obesity (HCC) 08/13/2012   ? On supplemental oxygen therapy    ? OSA on CPAP 08/13/2012   ? Pneumonia 04/2012    [facility]   ? Pneumonia due to COVID-19 virus 12/05/2018   ? Seasonal allergic reaction        Social History     Socioeconomic History   ?  Marital status: Married     Spouse name: [name]   ? Number of children: 0   Occupational History   ? Occupation: former Therapist, music: RETIRED     Comment: lives in a 82 year old house with wife   Tobacco Use   ? Smoking status: Former     Packs/day: 2.00     Years: 40.00     Pack years: 80.00     Types: Cigarettes     Quit date: 07/09/1998     Years since quitting: 22.7   ? Smokeless tobacco: Never   Vaping Use   ? Vaping Use: Never used   Substance and Sexual Activity   ? Alcohol use: Yes     Comment: occasion   ? Drug use: Never   ? Sexual activity: Not Currently     Vaping/E-liquid Use   ? Vaping Use Never User      Vaping/E-liquid Substances   ? CBD No    ? Nicotine No    ? Other No    ? Flavored No    ? THC No    ? Unknown No                  ROS    Objective:     ? acetaminophen (TYLENOL) 325 mg tablet Take two tablets by mouth every 6 hours as needed for Pain.   ? albuterol 0.083% (PROVENTIL) 2.5 mg /3 mL (0.083 %) nebulizer solution Inhale 3 mL solution by nebulizer as directed every 6 hours as needed for Wheezing or Shortness of Breath. Indications: asthma   ? albuterol sulfate (PROAIR HFA) 90 mcg/actuation HFA aerosol inhaler Inhale two puffs by mouth into the lungs every 4 hours as needed for Wheezing or Shortness of Breath.   ? allopurinoL (ZYLOPRIM) 300 mg tablet Take one tablet by mouth daily. take with food   ? amitr-gabapen-emu oil 05-24-08% topical cream (COMPOUND) Apply to affected areas twice daily as needed.   ? arformoteroL (BROVANA) 15 mcg/2 mL nebulizer solution Inhale 2 mL solution by nebulizer as directed twice daily. Dx: J44.9   ? ASCORBIC ACID-ASCORBATE SODIUM PO Take 500 mg by mouth every morning.   ? atorvastatin (LIPITOR) 40 mg tablet Take 1 tablet by mouth once daily   ? azelastine (ASTELIN) 137 mcg (0.1 %) nasal spray Apply two sprays to each nostril as directed twice daily. Use in each nostril as directed   ? benzonatate (TESSALON PERLES) 100 mg capsule Take one capsule by mouth every 8 hours.   ? budesonide (PULMICORT) 0.25 mg/2 mL nebulizer solution Inhale 2 mL solution by nebulizer as directed twice daily. Dx: J44.9   ? bumetanide (BUMEX) 1 mg tablet TAKE 2 TABLETS BY MOUTH TWICE DAILY   ? cholecalciferol (VITAMIN D-3) 1,000 units tablet Take one tablet by mouth daily.   ? dulaglutide (TRULICITY) 1.5 mg/0.5 mL injection pen Inject 0.5 mL under the skin every 7 days.   ? duloxetine DR (CYMBALTA) 60 mg capsule Take 1 capsule by mouth once daily   ? finasteride (PROSCAR) 5 mg tablet Take one tablet by mouth daily.   ? fish oil- omega 3-DHA/EPA 300/1,000 mg capsule Take one capsule by mouth daily.   ? flash glucose scanning reader (FREESTYLE LIBRE) reader Use as directed.   ? flash glucose sensor (FREESTYLE LIBRE 14 DAY SENSOR) sensor Type 2 diabetes  Indications: type 2 diabetes mellitus   ? gabapentin (NEURONTIN) 100 mg capsule Take  four capsules by mouth twice daily.   ? insulin pen needles (disposable) (BD ULTRA-FINE MINI PEN NEEDLE) 31 gauge x 3/16 pen needle Use with insulin pens   ? ipratropium bromide (ATROVENT) 21 mcg (0.03 %) nasal spray Apply two sprays to each nostril as directed every 12 hours.   ? JARDIANCE 25 mg tablet Take 1 tablet by mouth once daily   ? levoFLOXacin (LEVAQUIN) 750 mg tablet Take one tablet by mouth daily.   ? magnesium oxide 400 mg magnesium capsule Take one capsule by mouth twice daily.   ? mepolizumab (NUCALA) 100 mg/mL injection pen Inject 100 mg under the skin every 28 days.   ? metOLazone (ZAROXOLYN) 2.5 mg tablet Take one tablet by mouth daily as needed. PER OUTPATIENT CARDIOLOGY PROVIDER AS NEEDED FOR FLUID RETENTION   ? Miscellaneous Medical Supply misc itted for LE gradual compression stockings at a medical supply store   ? Miscellaneous Medical Supply misc Vancomycin 1,500 mg IV every 24hours starting 06/25/20 x 4 days (14 total antibiotic days), please draw vancomycin level per your protocol at [facility]   ? Miscellaneous Medical Supply misc Rx: Please wrap legs with ACE bandage from toes as high up to the leg as possible bilaterally  Dx: Lymphedema   ? montelukast (SINGULAIR) 10 mg tablet Take one tablet by mouth at bedtime daily.   ? nitroglycerin (NITROSTAT) 0.4 mg tablet Place one tablet under tongue every 5 minutes as needed for Chest Pain. Max of 3 tablets, call 911.   ? nystatin (MYCOSTATIN) 100,000 unit/g topical cream Apply  topically to affected area twice daily.   ? nystatin (MYCOSTATIN) 100,000 unit/g topical ointment Apply to affected areas TID   ? nystatin (NYSTOP) 100,000 unit/g topical powder Apply  topically to affected area four times daily.   ? pantoprazole DR (PROTONIX) 40 mg tablet Take one tablet by mouth twice daily.   ? polyethylene glycol 3350 (MIRALAX) 17 g packet Take two packets by mouth twice daily.   ? rivaroxaban (XARELTO) 20 mg tablet Take one tablet by mouth daily with dinner. Take with food.   ? sodium chloride (HYPER-SAL) 3.5 % inhalation solution Inhale 4 mL by mouth into the lungs twice daily. 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 spironolactone (ALDACTONE) 25 mg tablet Take one tablet by mouth twice daily. Take with food.   ? tamsulosin (FLOMAX) 0.4 mg capsule Take one capsule by mouth daily. Do not crush, chew or open capsules. Take 30 minutes following the same meal each day.   ? traMADoL (ULTRAM) 50 mg tablet Take one tablet by mouth every 8 hours as needed for Pain.   ? zinc sulfate 220 mg (50 mg elemental zinc) capsule Take one capsule by mouth daily.     Vitals:    04/19/21 1353 04/19/21 1421   BP: 98/61 95/60   BP Source:  Arm, Right Upper   Pulse: 83 71   Resp: 12    SpO2: 98%    PainSc: Ten    Weight: 117.5 kg (259 lb)    Height: 180.3 cm (5' 11)      Body mass index is 36.12 kg/m?[name]     BP Readings from Last 5 Encounters:   04/19/21 95/60   03/08/21 106/67   02/24/21 118/58   02/08/21 111/65   01/27/21 125/64     Wt Readings from Last 5 Encounters:   04/19/21 117.5 kg (259 lb)   03/18/21 123.4 kg (272 lb)   03/08/21 123.4 kg (  272 lb)   02/24/21 126.1 kg (278 lb)   02/08/21 127 kg (280 lb)       Physical Exam  Constitutional:       Appearance: He is obese.   Eyes:      Pupils: Pupils are equal, round, and reactive to light.   Cardiovascular:      Rate and Rhythm: Normal rate and regular rhythm.      Pulses: Normal pulses.      Heart sounds: No murmur heard.     Comments: Distant heart sounds  Pulmonary:      Effort: Pulmonary effort is normal.      Comments: On 4 L of oxygen per nasal cannula    Diminished breath sounds throughout    Did have a clear productive cough upon deep breathing  Abdominal:      General: Bowel sounds are normal.   Skin:     Comments: Ecchymosis over bilateral hands and upper forearms   Neurological:      General: No focal deficit present.      Mental Status: He is alert and oriented to person, place, and time.   Psychiatric:         Mood and Affect: Mood normal.         Behavior: Behavior normal.         Thought Content: Thought content normal.         Judgment: Judgment normal.              Assessment and Plan:    Chronic combined systolic and diastolic heart failure  > Has dual-chamber CRT defibrillator- by Dr. Milas Kocher  > 11/04/2014 EF 20%, severely dilated LV with grade 1 diastolic dysfunction.  > 10/21 echo: EF 35%  > Metoprolol DC'd with recent hospital stay  > HOLD spironolactone 25mg  twice daily for hypotension   > Continue Bumex 2 mg twice daily  > Continue Jardiance 25 mg daily  > Has allergy to ARB.  ?  Coronary artery disease  > CTO of RCA with obtuse marginal branch with high-grade stenosis (90%) and mid left circumflex.  > Advised on heart healthy diet and exercise  > Continue high intensity statin, atorvastatin  ?  Paroxysmal atrial fibrillation  > Elevated CHADS-VASc score of at least 5 (age above 33, hypertension, coronary artery disease).  >  Continue rivaroxaban20 mg daily for now. Due to his epistaxis, not a candidate for long-term anticoagulation  > He is a candidate for left atrial appendage closure  ?  Type 2 diabetes: Goal A1c less than 7.0%  > A1c 4 months ago 6.1%  > Continue Jardiance 25 mg daily  > DECREASE Trulicity from 1.5mg  to  0.75 mg weekly  ?  Moderate Persistent asthma and COPD:  >? Continue oxygen supplementation 4/NC  >Continue montelukast 10 mg daily  >?Continue Astelin  >?Continue budesonide nebulizers twice daily  >?Continue Brovana 1 nebulizer twice daily  >?Continue ProAir and albuterol nebulizers as needed    RIGHT buttocks decubi stage 2.   > Home health for wound care  > keep clean  > Apply Desitin    Depression  > Decrease duloxetine back to 60 mg daily.  He was briefly on 90 mg and did not see a benefit except increase of diarrhea    Diarrhea:  > This is definitely multifactoral..  We did decrease his Trulicity back down to 0.75 mg daily.  His A1c was under great control at 6.1% on this dose.  We will also have him stop his doxycycline.  While some have him go back down on his SNRI. Stool cultures were ordered.    Ruben Wood. was seen today for post-hospital follow up.    Diagnoses and all orders for this visit:    Hypotension due to drugs    Mixed dyslipidemia    ICD (implantable cardioverter-defibrillator), biventricular, in situ    Ischemic cardiomyopathy    Chronic combined systolic (congestive) and diastolic (congestive) heart failure (HCC)    Type 2 diabetes mellitus with hyperglycemia, without long-term current use of insulin (HCC)    Morbid obesity with BMI of 40.0-44.9, adult (HCC)    Diarrhea, unspecified type    Decubitus ulcer of right buttock, stage 2 (HCC)  -     AMB REFERRAL TO HOME CARE        Orders Placed This Encounter   ? HOME CARE     Future Appointments   Date Time Provider Department Center   04/25/2021  4:00 PM [facility]IT[facility]ING [facility] CVM Procedur   04/26/2021 11:30 AM Aiyannah Fayad, Lamar Blinks, APRN-NP [facility] IM   05/04/2021  1:30 PM CVM SN ECHO 1 SNECHO CVM Procedur   05/04/2021  3:30 PM Vanetta Shawl I, MD [facility] CVM Exam   05/11/2021 11:30 AM Com[facility], [name], MD [facility]   05/26/2021  4:00 PM [facility] CVM Procedur   06/02/2021  1:00 PM Raelyn Mora, PA-C [facility]   06/06/2021 10:00 AM Donnelly Stager, MD [facility] CVM Exam   06/06/2021 10:00 AM [place] [facility] CVM Procedur   06/27/2021  4:00 PM [facility] CVM Procedur   09/13/2021  3:10 PM Deveron Furlong, MD [facility] IM         Lamar Blinks Kazim Corrales, APRN-NP             Total of 40 minutes were spent on the same day of the visit including preparing to see the patient, obtaining and/or reviewing separately obtained history, performing a medically appropriate examination and/or evaluation, counseling and educating the patient/family/caregiver, ordering medications, tests, or procedures, referring and communication with other health care professionals, documenting clinical information in the electronic or other health record, independently interpreting results and communicating results to the patient/family/caregiver, and care coordination. I used a dictation program when conducting this note. Due to this, there may be grammatical errors or inconsistencies. For clarification or questions, please contact me.

## 2021-04-20 ENCOUNTER — Encounter: Admit: 2021-04-20 | Discharge: 2021-04-20 | Payer: MEDICARE

## 2021-04-20 DIAGNOSIS — I5043 Acute on chronic combined systolic (congestive) and diastolic (congestive) heart failure: Secondary | ICD-10-CM

## 2021-04-20 DIAGNOSIS — L89312 Pressure ulcer of right buttock, stage 2: Secondary | ICD-10-CM

## 2021-04-21 ENCOUNTER — Encounter: Admit: 2021-04-21 | Discharge: 2021-04-21 | Payer: MEDICARE

## 2021-04-21 DIAGNOSIS — I509 Heart failure, unspecified: Secondary | ICD-10-CM

## 2021-04-21 DIAGNOSIS — Z8614 Personal history of Methicillin resistant Staphylococcus aureus infection: Secondary | ICD-10-CM

## 2021-04-21 DIAGNOSIS — N401 Enlarged prostate with lower urinary tract symptoms: Secondary | ICD-10-CM

## 2021-04-21 DIAGNOSIS — I5042 Chronic combined systolic (congestive) and diastolic (congestive) heart failure: Secondary | ICD-10-CM

## 2021-04-21 DIAGNOSIS — J189 Pneumonia, unspecified organism: Secondary | ICD-10-CM

## 2021-04-21 DIAGNOSIS — I429 Cardiomyopathy, unspecified: Secondary | ICD-10-CM

## 2021-04-21 DIAGNOSIS — R609 Edema, unspecified: Secondary | ICD-10-CM

## 2021-04-21 DIAGNOSIS — J45909 Unspecified asthma, uncomplicated: Secondary | ICD-10-CM

## 2021-04-21 DIAGNOSIS — R053 Chronic cough: Secondary | ICD-10-CM

## 2021-04-21 DIAGNOSIS — J302 Other seasonal allergic rhinitis: Secondary | ICD-10-CM

## 2021-04-21 DIAGNOSIS — E119 Type 2 diabetes mellitus without complications: Secondary | ICD-10-CM

## 2021-04-21 DIAGNOSIS — Z9981 Dependence on supplemental oxygen: Secondary | ICD-10-CM

## 2021-04-21 DIAGNOSIS — I10 Essential (primary) hypertension: Secondary | ICD-10-CM

## 2021-04-21 DIAGNOSIS — J449 Chronic obstructive pulmonary disease, unspecified: Secondary | ICD-10-CM

## 2021-04-21 DIAGNOSIS — R5381 Other malaise: Secondary | ICD-10-CM

## 2021-04-21 DIAGNOSIS — J479 Bronchiectasis, uncomplicated: Secondary | ICD-10-CM

## 2021-04-21 DIAGNOSIS — I2510 Atherosclerotic heart disease of native coronary artery without angina pectoris: Secondary | ICD-10-CM

## 2021-04-21 DIAGNOSIS — L03116 Cellulitis of left lower limb: Secondary | ICD-10-CM

## 2021-04-21 DIAGNOSIS — U071 Pneumonia due to COVID-19 virus: Secondary | ICD-10-CM

## 2021-04-21 DIAGNOSIS — M954 Acquired deformity of chest and rib: Secondary | ICD-10-CM

## 2021-04-21 DIAGNOSIS — R0600 Dyspnea, unspecified: Secondary | ICD-10-CM

## 2021-04-21 DIAGNOSIS — N183 CKD (chronic kidney disease) stage 3, GFR 30-59 ml/min (HCC): Secondary | ICD-10-CM

## 2021-04-21 DIAGNOSIS — M961 Postlaminectomy syndrome, not elsewhere classified: Secondary | ICD-10-CM

## 2021-04-21 DIAGNOSIS — D509 Iron deficiency anemia, unspecified: Secondary | ICD-10-CM

## 2021-04-21 DIAGNOSIS — E782 Mixed hyperlipidemia: Secondary | ICD-10-CM

## 2021-04-21 DIAGNOSIS — I714 AAA (abdominal aortic aneurysm): Secondary | ICD-10-CM

## 2021-04-21 DIAGNOSIS — K219 Gastro-esophageal reflux disease without esophagitis: Secondary | ICD-10-CM

## 2021-04-21 DIAGNOSIS — Z95828 Presence of other vascular implants and grafts: Secondary | ICD-10-CM

## 2021-04-21 DIAGNOSIS — Z8679 Personal history of other diseases of the circulatory system: Secondary | ICD-10-CM

## 2021-04-21 DIAGNOSIS — G4733 Obstructive sleep apnea (adult) (pediatric): Secondary | ICD-10-CM

## 2021-04-21 DIAGNOSIS — I4891 Unspecified atrial fibrillation: Secondary | ICD-10-CM

## 2021-04-22 ENCOUNTER — Encounter: Admit: 2021-04-22 | Discharge: 2021-04-22 | Payer: MEDICARE

## 2021-04-22 NOTE — Telephone Encounter
-----   Message -----   From: Arneta Cliche, MD   To: Elayne Snare, RN   Subject: RE: Alert received for BiVP <90%           he has dizzy episodes also. Needs to see Betti Cruz   Call to pt spoke to wife as pt with [facility], wife states pt was having low blood pressures so Dr Comfort dc'd spironolactone.  Wife states pt's low bp was noted during new hospitalization in [place] at [facility].   Pt blacked out and fell 3 times and went to hospital had UTI. Pt now very week has HH and home PT: pt does not get up much per wife. Pt using recliner to sleep. Advised wife will send message to Reddy's office for appt.

## 2021-04-22 NOTE — Telephone Encounter
Called Ruben Wood. to schedule an appointment with the pharmacist for Trulicity follow up per their primary care provider's request.  Patient was not interested in setting up pharmacist appointment at this time.     The PCP has been notified that the patient will not be scheduled for a pharmacist visit.  Should the patient require pharmacist assistance in the future, another referral should be placed.    Arman Filter  Primary Care Pharmacy Technician Coordinator   [phone]

## 2021-04-22 NOTE — Telephone Encounter
Verl Bangs from [facility] medical records department left voicemail asking for clarification on a medical records request. She states that they have over 1,000 pages of medical records and wanted to know what time period the request is for. I researched patient chart and was unable to find reference to this request. Returned phone call and informed Morrie Sheldon that I would forward the question to the primary nurse of the provider who requested the records. She verbalized understanding.

## 2021-04-25 ENCOUNTER — Encounter: Admit: 2021-04-25 | Discharge: 2021-04-25 | Payer: MEDICARE

## 2021-04-26 ENCOUNTER — Encounter: Admit: 2021-04-26 | Discharge: 2021-04-26 | Payer: MEDICARE

## 2021-04-26 ENCOUNTER — Ambulatory Visit: Admit: 2021-04-26 | Discharge: 2021-04-27 | Payer: MEDICARE

## 2021-04-26 DIAGNOSIS — I429 Cardiomyopathy, unspecified: Secondary | ICD-10-CM

## 2021-04-26 DIAGNOSIS — J479 Bronchiectasis, uncomplicated: Secondary | ICD-10-CM

## 2021-04-26 DIAGNOSIS — U071 Pneumonia due to COVID-19 virus: Secondary | ICD-10-CM

## 2021-04-26 DIAGNOSIS — M461 Sacroiliitis, not elsewhere classified: Secondary | ICD-10-CM

## 2021-04-26 DIAGNOSIS — Z8614 Personal history of Methicillin resistant Staphylococcus aureus infection: Secondary | ICD-10-CM

## 2021-04-26 DIAGNOSIS — I10 Essential (primary) hypertension: Secondary | ICD-10-CM

## 2021-04-26 DIAGNOSIS — I5042 Chronic combined systolic (congestive) and diastolic (congestive) heart failure: Secondary | ICD-10-CM

## 2021-04-26 DIAGNOSIS — K219 Gastro-esophageal reflux disease without esophagitis: Secondary | ICD-10-CM

## 2021-04-26 DIAGNOSIS — M961 Postlaminectomy syndrome, not elsewhere classified: Secondary | ICD-10-CM

## 2021-04-26 DIAGNOSIS — J189 Pneumonia, unspecified organism: Secondary | ICD-10-CM

## 2021-04-26 DIAGNOSIS — J45909 Unspecified asthma, uncomplicated: Secondary | ICD-10-CM

## 2021-04-26 DIAGNOSIS — Z95828 Presence of other vascular implants and grafts: Secondary | ICD-10-CM

## 2021-04-26 DIAGNOSIS — G4733 Obstructive sleep apnea (adult) (pediatric): Secondary | ICD-10-CM

## 2021-04-26 DIAGNOSIS — I714 AAA (abdominal aortic aneurysm): Secondary | ICD-10-CM

## 2021-04-26 DIAGNOSIS — I4891 Unspecified atrial fibrillation: Secondary | ICD-10-CM

## 2021-04-26 DIAGNOSIS — Z9981 Dependence on supplemental oxygen: Secondary | ICD-10-CM

## 2021-04-26 DIAGNOSIS — E119 Type 2 diabetes mellitus without complications: Secondary | ICD-10-CM

## 2021-04-26 DIAGNOSIS — I480 Paroxysmal atrial fibrillation: Secondary | ICD-10-CM

## 2021-04-26 DIAGNOSIS — F331 Major depressive disorder, recurrent, moderate: Secondary | ICD-10-CM

## 2021-04-26 DIAGNOSIS — E782 Mixed hyperlipidemia: Secondary | ICD-10-CM

## 2021-04-26 DIAGNOSIS — N183 CKD (chronic kidney disease) stage 3, GFR 30-59 ml/min (HCC): Secondary | ICD-10-CM

## 2021-04-26 DIAGNOSIS — M954 Acquired deformity of chest and rib: Secondary | ICD-10-CM

## 2021-04-26 DIAGNOSIS — J302 Other seasonal allergic rhinitis: Secondary | ICD-10-CM

## 2021-04-26 DIAGNOSIS — R609 Edema, unspecified: Secondary | ICD-10-CM

## 2021-04-26 DIAGNOSIS — J449 Chronic obstructive pulmonary disease, unspecified: Secondary | ICD-10-CM

## 2021-04-26 DIAGNOSIS — Z8679 Personal history of other diseases of the circulatory system: Secondary | ICD-10-CM

## 2021-04-26 DIAGNOSIS — I2510 Atherosclerotic heart disease of native coronary artery without angina pectoris: Secondary | ICD-10-CM

## 2021-04-26 DIAGNOSIS — L03116 Cellulitis of left lower limb: Secondary | ICD-10-CM

## 2021-04-26 DIAGNOSIS — R053 Chronic cough: Secondary | ICD-10-CM

## 2021-04-26 DIAGNOSIS — E1165 Type 2 diabetes mellitus with hyperglycemia: Secondary | ICD-10-CM

## 2021-04-26 DIAGNOSIS — I509 Heart failure, unspecified: Secondary | ICD-10-CM

## 2021-04-26 DIAGNOSIS — D801 Nonfamilial hypogammaglobulinemia: Secondary | ICD-10-CM

## 2021-04-26 DIAGNOSIS — R0600 Dyspnea, unspecified: Secondary | ICD-10-CM

## 2021-04-26 DIAGNOSIS — N401 Enlarged prostate with lower urinary tract symptoms: Secondary | ICD-10-CM

## 2021-04-26 DIAGNOSIS — D509 Iron deficiency anemia, unspecified: Secondary | ICD-10-CM

## 2021-04-26 MED ORDER — TRAMADOL 50 MG PO TAB
50 mg | ORAL_TABLET | ORAL | 0 refills | Status: AC | PRN
Start: 2021-04-26 — End: ?

## 2021-04-26 MED ORDER — LEVOFLOXACIN 750 MG PO TAB
750 mg | ORAL_TABLET | Freq: Every day | ORAL | 3 refills | 7.00000 days | Status: AC
Start: 2021-04-26 — End: ?
  Filled 2021-05-12: qty 5, 5d supply, fill #1

## 2021-04-26 MED ORDER — PREDNISONE 20 MG PO TAB
20 mg | ORAL_TABLET | Freq: Every day | ORAL | 0 refills | Status: AC
Start: 2021-04-26 — End: ?
  Filled 2021-05-12: qty 10, 5d supply, fill #1

## 2021-04-26 MED FILL — ALBUTEROL SULFATE 2.5 MG /3 ML (0.083 %) IN NEBU: 2.5 mg /3 mL (0.083 %) | RESPIRATORY_TRACT | 25 days supply | Qty: 300 | Fill #2 | Status: AC

## 2021-04-26 MED FILL — ARFORMOTEROL 15 MCG/2 ML IN NEBU: 15 mcg/2 mL | RESPIRATORY_TRACT | 30 days supply | Qty: 120 | Fill #2 | Status: AC

## 2021-04-26 MED FILL — BUDESONIDE 0.25 MG/2 ML IN NBSP: 0.25 mg/2 mL | RESPIRATORY_TRACT | 30 days supply | Qty: 120 | Fill #2 | Status: CP

## 2021-04-26 MED FILL — SODIUM CHLORIDE 3.5 % IN NEBU: 3.5 % | RESPIRATORY_TRACT | 30 days supply | Qty: 240 | Fill #2 | Status: AC

## 2021-04-26 NOTE — Progress Notes
Date of Service: 04/26/2021    Ruben Wood. is a 81 y.o. male. DOB: 1940/12/07   MRN#: [id number]    Subjective:   No chief complaint on file.          History of Present Illness    History of present illness:  Ruben Wood. is 81 y.o. Ruben Wood [facility] patient who presents to clinic for a 1 week follow-up for hypotension. He has a history of type 2 diabetes, severe asthma with bronchiectasis, peripheral vascular disease, hyperlipidemia, ischemic cardiomyopathy with ICD implantation,?severely impaired LV ejection fraction,?hypertension, coronary artery disease, stage III CKD, hypergammaglobulinemia and sleep apnea.?He is accompanied by his wife Ruben Wood.    We saw him a week and a half ago for a hospital follow-up.  He had a 2-week hospital stay in [facility] for acute cystitis with hematuria and hypotension.    He was still hypotensive in the office and was given 500 cc of IV fluids and his spironolactone was held.  He had a stage II decubitus on his right buttocks and home health wound care was ordered.  Physical therapy was also ordered to help with his deconditioning.  In regards to his diarrhea his Trulicity was decreased from 1.5 mg weekly to 0.75 mg weekly.  His duloxetine was also decreased back to 60 mg from 90 mg pain.  He did not see a decrease in his depression at 90 mg.  We also ordered stool cultures including C. difficile.  He has an echo scheduled for 05/04/2021 and has a follow-up appoint with Dr. Sherryll Burger on 05/04/21 and with Dr. Betti Cruz on 06/06/2021.    Today he reports he is doing better since we last saw him.  He had slept for a couple days but after that has been doing so much better.  His wife notes he is in good spirits.  They have had family and friends over and is doing more.  On the days that he is pretty active he does get pretty tired by the end of the day.  His family was surprised he was able to get in their SUV without any problems.  They actually took a picture of him in the SUV and sent it to the family.  Wound care has come out 2 different times for his decubiti on his right buttocks.  Physical therapy was ordered but they have not come out yet.  They are hoping he will come out soon.      He does continue to have lumbar back pain.  His MRI showed nerve compression on the right at L2-3 and L3-4. ?There is some narrowing of the spinal canal in the mid to lower spine due to increased fat content. He has seen Dr. Durel Salts in the past.  He was can go for a spinal injection but unfortunately was on antibiotics at that time.  He treats his pain with acetaminophen and tramadol.  Before the drive over he took 2 tramadol and 1 acetaminophen.  He is still in quite a bit of lumbar back pain.  He would like a different regimen until he can see Dr. Katrinka Blazing.    He was experiencing cloudy urine and home health ran a urine which was positive for yeast.  He was given a prescription for Fluconazole.  The home health nurse noted on Friday that his lung sounded abnormal like he was starting respiratory infection.  He did have a cough at that time.  He took Levaquin, which  she has a running prescription for this.  However after talking to the pharmacist they realized that fluconazole and Levaquin interacted.  He has not taken Levaquin since then.  He does have shortness of breath but not more than his normal.  He denies any wheezing or coughing.  He would like a refill on his prednisone and Levaquin as he keeps this on hand.    Luckily he has not had any diarrhea or incontinence of stool since his last visit.  His last bowel movement was 2 days ago and was firm with some liquid.  They stopped taking the stool softeners as this was causing extra soft stools.      Medical History:   Diagnosis Date   ? AAA (abdominal aortic aneurysm) 10/15/2012   ? Asthma    ? Atrial fibrillation (HCC)    ? BPH with obstruction/lower urinary tract symptoms    ? Bronchiectasis (HCC)    ? CAD (coronary artery disease), native coronary artery 08/15/2012    07/11/2002- Cath @ [facility] showed Severe double vessel disease(OMB of circumflex and distal circ)  inferior-basilar dyskinesis.                 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  09/12/12   Cath    ? Cardiomyopathy (HCC) 07/19/2014   ? Cellulitis of left lower extremity 11/12/2014    10/2014: admitted with cellulitis, [place] negative for venous clot, MRI without osteomyelitis.    11/12/2014 In clinic today, still with cellulitis.  Per patient and his wife, the erythema may be extending.  Cultures from drainage in hospital with MSSA, but Blood Cx negative.  No e/o osteomyelitis.  My concern is that this antibiotic regimen is not adequately treating his cellulitis.  We will broaden coverage to get MRSA with doxycycline.  Patient and wife given strict call/return criteria.  I will see patient in 3-4 days for re-evaluation.  No fever today.  Plan:  Stop cefpodoxime and start doxycylcine  11/17/2014 Much better, no warmth and erythema minimal.   Plan: continue doxycyline for full 10 day course.      ? Chest wall deformity 07/09/2012    Chest wall reconstruction on 07/09/12    ? CHF (congestive heart failure) (HCC)    ? 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 Chronic cough 08/29/2012   ? 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 COPD (chronic obstructive pulmonary disease) (HCC) 08/13/2012   ? Diabetes (HCC)    ? Dyspnea    ? Edema    ? GERD (gastroesophageal reflux disease) 08/13/2012   ? History of CHF (congestive heart failure) 08/13/2012   ? History of MRSA infection 10/14/2012   ? 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    ? Hypertension 08/13/2012   ? IDA (iron deficiency anemia)    ? 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 Morbid obesity (HCC) 08/13/2012   ? On supplemental oxygen therapy    ? OSA on CPAP 08/13/2012   ? Pneumonia 04/2012    [facility]   ? Pneumonia due to COVID-19 virus 12/05/2018   ? Seasonal allergic reaction        Social History     Socioeconomic History   ? Marital status: Married     Spouse name: Ruben Wood   ? Number of children: 0   Occupational History   ? Occupation: former Therapist, music: RETIRED     Comment: lives in a 82 year old house with wife   Tobacco Use   ? Smoking status: Former     Packs/day: 2.00     Years: 40.00     Pack years: 80.00     Types: Cigarettes     Quit date: 07/09/1998     Years since quitting: 22.8   ? Smokeless tobacco: Never   Vaping Use   ? Vaping Use: Never used   Substance and Sexual Activity   ? Alcohol use: Yes     Comment: occasion   ? Drug use: Never   ? Sexual activity: Not Currently     Vaping/E-liquid Use   ? Vaping Use Never User      Vaping/E-liquid Substances   ? CBD No    ? Nicotine No    ? Other No    ? Flavored No    ? THC No    ? Unknown No                  ROS    Objective:     ? acetaminophen (TYLENOL) 325 mg tablet Take two tablets by mouth every 6 hours as needed for Pain.   ? albuterol 0.083% (PROVENTIL) 2.5 mg /3 mL (0.083 %) nebulizer solution Inhale 3 mL solution by nebulizer as directed every 6 hours as needed for Wheezing or Shortness of Breath. Indications: asthma   ? albuterol sulfate (PROAIR HFA) 90 mcg/actuation HFA aerosol inhaler Inhale two puffs by mouth into the lungs every 4 hours as needed for Wheezing or Shortness of Breath.   ? allopurinoL (ZYLOPRIM) 300 mg tablet Take one tablet by mouth daily. take with food   ? amitr-gabapen-emu oil 05-24-08% topical cream (COMPOUND) Apply to affected areas twice daily as needed.   ? arformoteroL (BROVANA) 15 mcg/2 mL nebulizer solution Inhale 2 mL solution by nebulizer as directed twice daily. Dx: J44.9   ? ASCORBIC ACID-ASCORBATE SODIUM PO Take 500 mg by mouth every morning.   ? atorvastatin (LIPITOR) 40 mg tablet Take 1 tablet by mouth once daily   ? azelastine (ASTELIN) 137 mcg (0.1 %) nasal spray Apply two sprays to each nostril as directed twice daily. Use in each nostril as directed   ? benzonatate (TESSALON PERLES) 100 mg capsule Take one capsule by mouth every 8 hours.   ? budesonide (PULMICORT) 0.25 mg/2 mL nebulizer solution Inhale 2 mL solution by nebulizer as directed twice daily. Dx: J44.9   ? bumetanide (BUMEX) 1 mg tablet TAKE 2 TABLETS BY MOUTH TWICE DAILY   ? cholecalciferol (VITAMIN D-3) 1,000 units tablet Take one tablet by mouth daily.   ? dulaglutide (TRULICITY) 0.75 mg/0.5 mL injection pen Inject 0.5 mL under the skin every 7 days. Indications: type 2 diabetes mellitus   ? duloxetine DR (CYMBALTA) 60 mg capsule Take 1  capsule by mouth once daily   ? finasteride (PROSCAR) 5 mg tablet Take one tablet by mouth daily.   ? fish oil- omega 3-DHA/EPA 300/1,000 mg capsule Take one capsule by mouth daily.   ? flash glucose scanning reader (FREESTYLE LIBRE) reader Use as directed.   ? flash glucose sensor (FREESTYLE LIBRE 14 DAY SENSOR) sensor Type 2 diabetes  Indications: type 2 diabetes mellitus   ? gabapentin (NEURONTIN) 100 mg capsule Take four capsules by mouth twice daily.   ? insulin pen needles (disposable) (BD ULTRA-FINE MINI PEN NEEDLE) 31 gauge x 3/16 pen needle Use with insulin pens   ? ipratropium bromide (ATROVENT) 21 mcg (0.03 %) nasal spray Apply two sprays to each nostril as directed every 12 hours. ? JARDIANCE 25 mg tablet Take 1 tablet by mouth once daily   ? levoFLOXacin (LEVAQUIN) 750 mg tablet Take one tablet by mouth daily.   ? magnesium oxide 400 mg magnesium capsule Take one capsule by mouth twice daily.   ? mepolizumab (NUCALA) 100 mg/mL injection pen Inject 100 mg under the skin every 28 days.   ? metOLazone (ZAROXOLYN) 2.5 mg tablet Take one tablet by mouth daily as needed. PER OUTPATIENT CARDIOLOGY PROVIDER AS NEEDED FOR FLUID RETENTION   ? Miscellaneous Medical Supply misc itted for LE gradual compression stockings at a medical supply store   ? Miscellaneous Medical Supply misc Vancomycin 1,500 mg IV every 24hours starting 06/25/20 x 4 days (14 total antibiotic days), please draw vancomycin level per your protocol at [facility].   ? Miscellaneous Medical Supply misc Rx: Please wrap legs with ACE bandage from toes as high up to the leg as possible bilaterally  Dx: Lymphedema   ? montelukast (SINGULAIR) 10 mg tablet Take one tablet by mouth at bedtime daily.   ? nitroglycerin (NITROSTAT) 0.4 mg tablet Place one tablet under tongue every 5 minutes as needed for Chest Pain. Max of 3 tablets, call 911.   ? nystatin (MYCOSTATIN) 100,000 unit/g topical cream Apply  topically to affected area twice daily.   ? nystatin (MYCOSTATIN) 100,000 unit/g topical ointment Apply to affected areas TID   ? nystatin (NYSTOP) 100,000 unit/g topical powder Apply  topically to affected area four times daily.   ? pantoprazole DR (PROTONIX) 40 mg tablet Take one tablet by mouth twice daily.   ? polyethylene glycol 3350 (MIRALAX) 17 g packet Take two packets by mouth twice daily.   ? rivaroxaban (XARELTO) 20 mg tablet Take one tablet by mouth daily with dinner. Take with food.   ? sodium chloride (HYPER-SAL) 3.5 % inhalation solution Inhale 4 mL by mouth into the lungs twice daily. 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 spironolactone (ALDACTONE) 25 mg tablet Take one tablet by mouth twice daily. Take with food.   ? tamsulosin (FLOMAX) 0.4 mg capsule Take one capsule by mouth daily. Do not crush, chew or open capsules. Take 30 minutes following the same meal each day.   ? traMADoL (ULTRAM) 50 mg tablet Take one tablet by mouth every 8 hours as needed for Pain.   ? zinc sulfate 220 mg (50 mg elemental zinc) capsule Take one capsule by mouth daily.     There were no vitals filed for this visit.  There is no height or weight on file to calculate BMI.     BP Readings from Last 5 Encounters:   04/19/21 95/60   03/08/21 106/67   02/24/21 118/58   02/08/21 111/65   01/27/21 125/64  Wt Readings from Last 5 Encounters:   04/19/21 117.5 kg (259 lb)   03/18/21 123.4 kg (272 lb)   03/08/21 123.4 kg (272 lb)   02/24/21 126.1 kg (278 lb)   02/08/21 127 kg (280 lb)       Physical Exam  Constitutional:       Appearance: He is obese.   Eyes:      Pupils: Pupils are equal, round, and reactive to light.   Cardiovascular:      Heart sounds: No murmur heard.     Comments: Distant heart   Pulmonary:      Effort: Pulmonary effort is normal.      Comments: Distant lungs sounds    Wearing 4 L of oxygen per nasal cannula  Abdominal:      General: Abdomen is flat. Bowel sounds are normal. There is no distension.      Palpations: There is no mass.      Hernia: No hernia is present.   Musculoskeletal:      Comments: In wheelchair.   Neurological:      Mental Status: He is alert and oriented to person, place, and time.      Comments: Hard of hearing   Psychiatric:         Mood and Affect: Mood normal.         Behavior: Behavior normal.         Thought Content: Thought content normal.         Judgment: Judgment normal.            Assessment and Plan:    >Dr. Comfort came in during the visit as well.     Dizziness and hypotension diarrhea  > Resolved since stopping doxycycline, decreasing Trulicity and holding the spironolactone.    Lumbar Back Pain:  > Nerve compression on the right at L2-3 and L3-4.   > Continue to follow-up with Dr. Katrinka Blazing  > Can increase tramadol to every 6 hours as needed with acetaminophen (not to take more than 3 g a day)    Chronic combined systolic and diastolic heart failure  >?Appoint with Dr. Sherryll Burger on 05/04/2021  > Has dual-chamber CRT defibrillator- by Dr. Milas Kocher  >?11/04/2014 EF 20%, severely dilated LV with grade 1 diastolic dysfunction.  > 10/21 echo: EF 35%  > Echo ordered for 05/04/2021  >?Metoprolol DC'd with recent hospital stay  >  Continue to HOLD spironolactone?25mg  twice daily for hypotension   >?Continue Bumex 2 mg twice daily  >?Continue Jardiance 25 mg daily  > Has allergy to ARB.  ?  Coronary artery disease  >?RCA with obtuse marginal branch with high-grade stenosis (90%)?and mid left circumflex.  >?Advised on heart healthy diet and exercise  >?Continue high intensity statin,?atorvastatin  ?  Paroxysmal atrial fibrillation  >?Elevated CHADS-VASc score of at least 5 (age above 16, hypertension, coronary artery disease).  >??Continue rivaroxaban20 mg daily for now. Due?to his epistaxis, not a candidate for long-term anticoagulation  >?He is a candidate for left atrial appendage closure  > Keep appoint with Dr. Betti Cruz on 06/06/2021  ?  Type 2 diabetes:?Goal A1c less than 7.0%  >?A1c 4 months ago 6.1%  > Continue Jardiance 25 mg daily  > Continue Trulicity 0.75 mg weekly.  Was decreased from 1.5 mg weekly a week and half ago due to diarrhea   ?  Moderate Persistent asthma and COPD:  >?Continue oxygen supplementation 4/NC  > Continue montelukast 10 mg daily  >?Continue Astelin  >?  Continue budesonide nebulizers twice daily  >?Continue Brovana 1 nebulizer twice daily  >?Continue ProAir and albuterol nebulizers as needed  ?  RIGHT buttocks decubi stage 2.   > Continue Home health for wound care  > keep clean  ?  Depression  >  Continue duloxetine back to 60 mg daily.    ?    There are no diagnoses linked to this encounter.       Future Appointments   Date Time Provider Department Center   04/26/2021 11:30 AM Budd Palmer, APRN-NP [facility] IM 05/04/2021  1:30 PM CVM SN ECHO 1 SNECHO CVM Procedur   05/04/2021  3:30 PM Vanetta Shawl I, MD [facility] CVM Exam   05/11/2021 11:30 AM Comfort, Ruben Large, MD [facility] IM   05/26/2021  4:00 PM [facility] REMOTE MONITORING [facility] CVM Procedur   06/02/2021  1:00 PM Raelyn Mora, PA-C [facility]   06/06/2021 10:00 AM Donnelly Stager, MD [facility] CVM Exam   06/06/2021 10:00 AM [place] [facility]EMAKER [facility] CVM Procedur   06/27/2021  4:00 PM MAC REMOTE MONITORING [facility] CVM Procedur   09/13/2021  3:10 PM Deveron Furlong, MD [facility] IM         Lamar Blinks Valma Rotenberg, APRN-NP             Total of 40 minutes were spent on the same day of the visit including preparing to see the patient, obtaining and/or reviewing separately obtained history, performing a medically appropriate examination and/or evaluation, counseling and educating the patient/family/caregiver, ordering medications, tests, or procedures, referring and communication with other health care professionals, documenting clinical information in the electronic or other health record, independently interpreting results and communicating results to the patient/family/caregiver, and care coordination. I used a dictation program when conducting this note. Due to this, there may be grammatical errors or inconsistencies. For clarification or questions, please contact me.

## 2021-04-26 NOTE — Progress Notes
Remote Patient Monitoring Risk Alert  I received a reading from you today I was concerned about.   Are you having any:   Shortness of air?none with O2 on at 4L at home 2L when sitting in car  Chest Pain? No  Edema? some, better when sitting with feet up in recliner and compression on   Dizziness? No  Wife states pt's strength is improving, getting around better  Have you taken all your medications today?Yes   Are you able to repeat the reading while I am on the phone with you? rechecked at home with O2 on and was WNL  Time Spent (minutes): 12     Remote Patient Monitoring        Some values may be hidden. Unless noted otherwise, only the newest values recorded on each date are displayed.         BP 04/01/21 04/02/21 04/03/21 04/20/21 04/25/21 04/26/21   No data to display.      Heart Rate 04/01/21 04/02/21 04/03/21 04/20/21 04/25/21 04/26/21   Heart Rate 98 bpm 96 bpm  46 bpm (A) 71 bpm 84 bpm 92 bpm    Some values recorded on this date have been omitted. (A) Abnormal value       SPO2 04/01/21 04/02/21 04/03/21 04/20/21 04/25/21 04/26/21   SPO2 95 % 95 %  96 % 92 % 95 % 82 % (A)    Some values recorded on this date have been omitted. (A) Abnormal value       Weight 04/01/21 04/02/21 04/03/21 04/20/21 04/25/21 04/26/21   No data to display.      Glucose 04/01/21 04/02/21 04/03/21 04/20/21 04/25/21 04/26/21   No data to display.

## 2021-04-27 ENCOUNTER — Encounter: Admit: 2021-04-27 | Discharge: 2021-04-27 | Payer: MEDICARE

## 2021-04-27 DIAGNOSIS — L89312 Pressure ulcer of right buttock, stage 2: Secondary | ICD-10-CM

## 2021-04-27 NOTE — Progress Notes
Contacted Ruben Wood. to discuss assistance regarding their medication: nucala.  Patient expressed understanding and agreed to the following next steps: specialty pharmacy mailed assistance applications today. Patient will complete and return. '.    Keyvin Rison [facility]  Pharmacy Patient Advocate, Specialty Pharmacy  [phone]

## 2021-04-28 ENCOUNTER — Encounter: Admit: 2021-04-28 | Discharge: 2021-04-28 | Payer: MEDICARE

## 2021-04-30 ENCOUNTER — Encounter: Admit: 2021-04-30 | Discharge: 2021-04-30 | Payer: MEDICARE

## 2021-05-02 ENCOUNTER — Encounter: Admit: 2021-05-02 | Discharge: 2021-05-02 | Payer: MEDICARE

## 2021-05-02 MED ORDER — PREDNISONE 20 MG PO TAB
20 mg | ORAL_TABLET | Freq: Every day | ORAL | 0 refills
Start: 2021-05-02 — End: ?

## 2021-05-02 MED ORDER — JARDIANCE 25 MG PO TAB
ORAL_TABLET | 0 refills | Status: AC
Start: 2021-05-02 — End: ?

## 2021-05-02 NOTE — Telephone Encounter
Some refill protocol elements NOT Met  Medication name: prednisone  Medication Strength: 20 mg    Last Fill Date: not on current med list advised to take for 5 days then d/c    Abnormal labs  Comprehensive Metabolic Profile    Lab Results   Component Value Date/Time    NA 132 (A) 03/15/2021 12:00 AM    K 5.4 (A) 03/15/2021 12:00 AM    CL 95 (A) 03/15/2021 12:00 AM    CO2 29.2 03/15/2021 12:00 AM    GAP 13.2 (A) 03/15/2021 12:00 AM    BUN 52 (A) 03/15/2021 12:00 AM    CR 1.9 (A) 03/15/2021 12:00 AM    GLU 153 (A) 03/15/2021 12:00 AM    Lab Results   Component Value Date/Time    CA 9.8 03/15/2021 12:00 AM    PO4 4.1 03/08/2020 03:51 AM    ALBUMIN 2.8 (A) 03/15/2021 12:00 AM    TOTPROT 8.3 (A) 03/15/2021 12:00 AM    ALKPHOS 88 03/15/2021 12:00 AM    AST 20 03/15/2021 12:00 AM    ALT 16 03/15/2021 12:00 AM    TOTBILI 0.7 03/15/2021 12:00 AM    GFR 34 03/15/2021 12:00 AM    GFRAA >60 12/03/2019 04:51 AM          Routed to Provider

## 2021-05-03 ENCOUNTER — Encounter: Admit: 2021-05-03 | Discharge: 2021-05-03 | Payer: MEDICARE

## 2021-05-04 ENCOUNTER — Encounter: Admit: 2021-05-04 | Discharge: 2021-05-04 | Payer: MEDICARE

## 2021-05-04 ENCOUNTER — Ambulatory Visit: Admit: 2021-05-04 | Discharge: 2021-05-04 | Payer: MEDICARE

## 2021-05-04 DIAGNOSIS — Z8614 Personal history of Methicillin resistant Staphylococcus aureus infection: Secondary | ICD-10-CM

## 2021-05-04 DIAGNOSIS — I5042 Chronic combined systolic (congestive) and diastolic (congestive) heart failure: Secondary | ICD-10-CM

## 2021-05-04 DIAGNOSIS — Z8679 Personal history of other diseases of the circulatory system: Secondary | ICD-10-CM

## 2021-05-04 DIAGNOSIS — I714 AAA (abdominal aortic aneurysm): Secondary | ICD-10-CM

## 2021-05-04 DIAGNOSIS — K219 Gastro-esophageal reflux disease without esophagitis: Secondary | ICD-10-CM

## 2021-05-04 DIAGNOSIS — D509 Iron deficiency anemia, unspecified: Secondary | ICD-10-CM

## 2021-05-04 DIAGNOSIS — R0989 Other specified symptoms and signs involving the circulatory and respiratory systems: Secondary | ICD-10-CM

## 2021-05-04 DIAGNOSIS — L03116 Cellulitis of left lower limb: Secondary | ICD-10-CM

## 2021-05-04 DIAGNOSIS — I2510 Atherosclerotic heart disease of native coronary artery without angina pectoris: Secondary | ICD-10-CM

## 2021-05-04 DIAGNOSIS — J189 Pneumonia, unspecified organism: Secondary | ICD-10-CM

## 2021-05-04 DIAGNOSIS — J302 Other seasonal allergic rhinitis: Secondary | ICD-10-CM

## 2021-05-04 DIAGNOSIS — I429 Cardiomyopathy, unspecified: Secondary | ICD-10-CM

## 2021-05-04 DIAGNOSIS — I10 Essential (primary) hypertension: Secondary | ICD-10-CM

## 2021-05-04 DIAGNOSIS — Z95828 Presence of other vascular implants and grafts: Secondary | ICD-10-CM

## 2021-05-04 DIAGNOSIS — J479 Bronchiectasis, uncomplicated: Secondary | ICD-10-CM

## 2021-05-04 DIAGNOSIS — M954 Acquired deformity of chest and rib: Secondary | ICD-10-CM

## 2021-05-04 DIAGNOSIS — R609 Edema, unspecified: Secondary | ICD-10-CM

## 2021-05-04 DIAGNOSIS — R0600 Dyspnea, unspecified: Secondary | ICD-10-CM

## 2021-05-04 DIAGNOSIS — J45909 Unspecified asthma, uncomplicated: Secondary | ICD-10-CM

## 2021-05-04 DIAGNOSIS — E119 Type 2 diabetes mellitus without complications: Secondary | ICD-10-CM

## 2021-05-04 DIAGNOSIS — N401 Enlarged prostate with lower urinary tract symptoms: Secondary | ICD-10-CM

## 2021-05-04 DIAGNOSIS — R053 Chronic cough: Secondary | ICD-10-CM

## 2021-05-04 DIAGNOSIS — E782 Mixed hyperlipidemia: Secondary | ICD-10-CM

## 2021-05-04 DIAGNOSIS — M961 Postlaminectomy syndrome, not elsewhere classified: Secondary | ICD-10-CM

## 2021-05-04 DIAGNOSIS — J449 Chronic obstructive pulmonary disease, unspecified: Secondary | ICD-10-CM

## 2021-05-04 DIAGNOSIS — U071 Pneumonia due to COVID-19 virus: Secondary | ICD-10-CM

## 2021-05-04 DIAGNOSIS — N183 CKD (chronic kidney disease) stage 3, GFR 30-59 ml/min (HCC): Secondary | ICD-10-CM

## 2021-05-04 DIAGNOSIS — G4733 Obstructive sleep apnea (adult) (pediatric): Secondary | ICD-10-CM

## 2021-05-04 DIAGNOSIS — I509 Heart failure, unspecified: Secondary | ICD-10-CM

## 2021-05-04 DIAGNOSIS — Z9981 Dependence on supplemental oxygen: Secondary | ICD-10-CM

## 2021-05-04 DIAGNOSIS — I4891 Unspecified atrial fibrillation: Secondary | ICD-10-CM

## 2021-05-04 MED ORDER — PERFLUTREN LIPID MICROSPHERES 1.1 MG/ML IV SUSP
1-10 mL | Freq: Once | INTRAVENOUS | 0 refills | Status: CP | PRN
Start: 2021-05-04 — End: ?
  Administered 2021-05-04: 19:00:00 4 mL via INTRAVENOUS

## 2021-05-04 MED FILL — ALBUTEROL SULFATE 2.5 MG /3 ML (0.083 %) IN NEBU: 2.5 mg /3 mL (0.083 %) | 19 days supply | Qty: 225 | Fill #2 | Status: CP

## 2021-05-04 NOTE — Progress Notes
Daily CardioMEMS Readings    Goal PA Diastolic: 15 mmHg   PA Diastolic Thresholds: 12-17 mmHg  Notified Dr. Vanetta Shawl

## 2021-05-04 NOTE — Progress Notes
Arneta Cliche, MD  P Cvm Nurse Hf Team [place]  We will follow on it tomorrow          Previous Messages      ----- Message -----   From: Starlyn Skeans, RN   Sent: 05/03/2021  4:49 PM CDT   To: Arneta Cliche, MD   Subject: FW: Low BiV pacing alert.               Hello Dr. Sherryll Burger,     Please see alert below for low BiV pacing and frequent PVCs. Patient has an appointment with you tomorrow 3/15. I don't see that he is on any antiarrhythmia/rate control.     Meds:   Bumex 2 mg BID   Jardiance 25 mg daily   Metolozone PRN   Xarelto 20 mg daily.     Do you have any recommendations for him or would you like to address it at his appointment tomorrow?     Thank you!   ----- Message -----   From: Gordy Savers   Sent: 05/03/2021  8:11 AM CDT   To: Cvm Nurse Hf Team [place]   Subject: Low BiV pacing alert.                 A Yellow Alert was reported by the device: Alert for low BiV pacing. Overall BiV pacing 86% since 02/24/21. Last value on trend BiV pacing 71% on 05/02/21. Current rhythm AP-BVP/AS-BVP with frequent PVCs. PVC burden reported at 5.2%. Low BiV pacing appears to be due to PVCs.     Patient has 05/04/21 [facility] appt.

## 2021-05-05 ENCOUNTER — Encounter: Admit: 2021-05-05 | Discharge: 2021-05-05 | Payer: MEDICARE

## 2021-05-05 MED ORDER — GABAPENTIN 100 MG PO CAP
400 mg | ORAL_CAPSULE | Freq: Two times a day (BID) | ORAL | 3 refills | Status: AC
Start: 2021-05-05 — End: ?

## 2021-05-05 NOTE — Progress Notes
Contacted [name] to discuss application regarding their medication: nucala.  Left voicemail asking patient to return call to the specialty pharmacy [phone]).  Will call patient again in 4 business days if no call back received.   Mcneil Sober  Pharmacy Patient Advocate, Specialty Pharmacy  [phone]

## 2021-05-05 NOTE — Telephone Encounter
Some refill protocol elements NOT Met  Medication name: Gabapentin   Medication Strength: 100MG  Last Fill Date: 02/18/2021    LOV: 04/26/2021  NOV: 05/11/2021    Manual message review    Routed to Provider        Neurology: Anticonvulsants - Gabapentin Failed 05/05/2021 11:17 AM   Protocol Details  This refill cannot be delegated    Cr in normal range and within 360 days    Depression Screening completed within the last 12 months    Valid encounter within last 12 months    eGFR in normal range and within 360 days

## 2021-05-06 ENCOUNTER — Encounter: Admit: 2021-05-06 | Discharge: 2021-05-06 | Payer: MEDICARE

## 2021-05-07 ENCOUNTER — Encounter: Admit: 2021-05-07 | Discharge: 2021-05-07 | Payer: MEDICARE

## 2021-05-08 ENCOUNTER — Encounter: Admit: 2021-05-08 | Discharge: 2021-05-08 | Payer: MEDICARE

## 2021-05-09 ENCOUNTER — Encounter: Admit: 2021-05-09 | Discharge: 2021-05-09 | Payer: MEDICARE

## 2021-05-09 MED FILL — SODIUM CHLORIDE 3.5 % IN NEBU: 3.5 % | RESPIRATORY_TRACT | 30 days supply | Qty: 240 | Fill #2 | Status: AC

## 2021-05-09 NOTE — Progress Notes
Ruben Wood. called to discuss application regarding their medication: nucala.  patient stated he had lost his return envelope but has a new one and will be mailing tomorrow. Mcneil Sober  Pharmacy Patient Advocate, Specialty Pharmacy  [phone]

## 2021-05-10 ENCOUNTER — Encounter: Admit: 2021-05-10 | Discharge: 2021-05-10 | Payer: MEDICARE

## 2021-05-10 NOTE — Progress Notes
Remote Patient Monitoring Non Adherence     Introduced self and verified patient.       Thank you, my reason for calling today is that our reports indicate that there has been a lapse in   your daily data measurements. I want to confirm your ongoing participation in the remote   patient program and address any challenges or questions you may have.     Family states equipment is unpaired from tablet, have tried to re pair. Sometimes works. Encouraged them to call [employer] for assistance, sent phone number via MyChart.     If you should have any immediate concerns or questions please call.    Time Spent (minutes): 16

## 2021-05-11 ENCOUNTER — Encounter: Admit: 2021-05-11 | Discharge: 2021-05-11 | Payer: MEDICARE

## 2021-05-11 ENCOUNTER — Ambulatory Visit: Admit: 2021-05-11 | Discharge: 2021-05-12 | Payer: MEDICARE

## 2021-05-11 ENCOUNTER — Ambulatory Visit: Admit: 2021-05-11 | Discharge: 2021-05-11 | Payer: MEDICARE

## 2021-05-11 DIAGNOSIS — J45909 Unspecified asthma, uncomplicated: Secondary | ICD-10-CM

## 2021-05-11 DIAGNOSIS — J449 Chronic obstructive pulmonary disease, unspecified: Secondary | ICD-10-CM

## 2021-05-11 DIAGNOSIS — L03116 Cellulitis of left lower limb: Secondary | ICD-10-CM

## 2021-05-11 DIAGNOSIS — K219 Gastro-esophageal reflux disease without esophagitis: Secondary | ICD-10-CM

## 2021-05-11 DIAGNOSIS — Z95828 Presence of other vascular implants and grafts: Secondary | ICD-10-CM

## 2021-05-11 DIAGNOSIS — E7800 Pure hypercholesterolemia, unspecified: Secondary | ICD-10-CM

## 2021-05-11 DIAGNOSIS — G4733 Obstructive sleep apnea (adult) (pediatric): Secondary | ICD-10-CM

## 2021-05-11 DIAGNOSIS — I255 Ischemic cardiomyopathy: Secondary | ICD-10-CM

## 2021-05-11 DIAGNOSIS — R053 Chronic cough: Secondary | ICD-10-CM

## 2021-05-11 DIAGNOSIS — D509 Iron deficiency anemia, unspecified: Secondary | ICD-10-CM

## 2021-05-11 DIAGNOSIS — J438 Other emphysema: Secondary | ICD-10-CM

## 2021-05-11 DIAGNOSIS — I509 Heart failure, unspecified: Secondary | ICD-10-CM

## 2021-05-11 DIAGNOSIS — I5043 Acute on chronic combined systolic (congestive) and diastolic (congestive) heart failure: Secondary | ICD-10-CM

## 2021-05-11 DIAGNOSIS — I5042 Chronic combined systolic (congestive) and diastolic (congestive) heart failure: Secondary | ICD-10-CM

## 2021-05-11 DIAGNOSIS — E119 Type 2 diabetes mellitus without complications: Secondary | ICD-10-CM

## 2021-05-11 DIAGNOSIS — I429 Cardiomyopathy, unspecified: Secondary | ICD-10-CM

## 2021-05-11 DIAGNOSIS — R197 Diarrhea, unspecified: Secondary | ICD-10-CM

## 2021-05-11 DIAGNOSIS — U071 Pneumonia due to COVID-19 virus: Secondary | ICD-10-CM

## 2021-05-11 DIAGNOSIS — M961 Postlaminectomy syndrome, not elsewhere classified: Secondary | ICD-10-CM

## 2021-05-11 DIAGNOSIS — Z9981 Dependence on supplemental oxygen: Secondary | ICD-10-CM

## 2021-05-11 DIAGNOSIS — I10 Essential (primary) hypertension: Secondary | ICD-10-CM

## 2021-05-11 DIAGNOSIS — I714 AAA (abdominal aortic aneurysm) (HCC): Secondary | ICD-10-CM

## 2021-05-11 DIAGNOSIS — E1165 Type 2 diabetes mellitus with hyperglycemia: Secondary | ICD-10-CM

## 2021-05-11 DIAGNOSIS — I4891 Unspecified atrial fibrillation: Secondary | ICD-10-CM

## 2021-05-11 DIAGNOSIS — Z8614 Personal history of Methicillin resistant Staphylococcus aureus infection: Secondary | ICD-10-CM

## 2021-05-11 DIAGNOSIS — R609 Edema, unspecified: Secondary | ICD-10-CM

## 2021-05-11 DIAGNOSIS — I2510 Atherosclerotic heart disease of native coronary artery without angina pectoris: Secondary | ICD-10-CM

## 2021-05-11 DIAGNOSIS — J479 Bronchiectasis, uncomplicated: Secondary | ICD-10-CM

## 2021-05-11 DIAGNOSIS — E782 Mixed hyperlipidemia: Secondary | ICD-10-CM

## 2021-05-11 DIAGNOSIS — J302 Other seasonal allergic rhinitis: Secondary | ICD-10-CM

## 2021-05-11 DIAGNOSIS — N183 Stage 3 chronic kidney disease, unspecified whether stage 3a or 3b CKD (HCC): Secondary | ICD-10-CM

## 2021-05-11 DIAGNOSIS — M954 Acquired deformity of chest and rib: Secondary | ICD-10-CM

## 2021-05-11 DIAGNOSIS — J9612 Chronic respiratory failure with hypercapnia: Secondary | ICD-10-CM

## 2021-05-11 DIAGNOSIS — Z8679 Personal history of other diseases of the circulatory system: Secondary | ICD-10-CM

## 2021-05-11 DIAGNOSIS — N401 Enlarged prostate with lower urinary tract symptoms: Secondary | ICD-10-CM

## 2021-05-11 DIAGNOSIS — R0600 Dyspnea, unspecified: Secondary | ICD-10-CM

## 2021-05-11 DIAGNOSIS — D649 Anemia, unspecified: Secondary | ICD-10-CM

## 2021-05-11 DIAGNOSIS — J189 Pneumonia, unspecified organism: Secondary | ICD-10-CM

## 2021-05-11 DIAGNOSIS — K5900 Constipation, unspecified: Secondary | ICD-10-CM

## 2021-05-11 DIAGNOSIS — R29898 Other symptoms and signs involving the musculoskeletal system: Secondary | ICD-10-CM

## 2021-05-11 LAB — CBC AND DIFF
ABSOLUTE BASO COUNT: 0 K/UL (ref 0–0.20)
ABSOLUTE EOS COUNT: 0.3 K/UL (ref 0–0.45)
ABSOLUTE LYMPH COUNT: 0.8 K/UL — ABNORMAL LOW (ref 1.0–4.8)
ABSOLUTE MONO COUNT: 0.7 K/UL (ref 0–0.80)
ABSOLUTE NEUTROPHIL: 4.6 K/UL (ref 1.8–7.0)
BASOPHILS %: 1 % (ref 0–2)
EOSINOPHILS %: 6 % — ABNORMAL HIGH (ref >60)
LYMPHOCYTES %: 13 % — ABNORMAL LOW (ref 24–44)
MCH: 31 pg (ref 26–34)
MCV: 94 FL (ref 80–100)
MONOCYTES %: 11 % (ref 4–12)
MPV: 7.3 FL (ref 7–11)
PLATELET COUNT: 287 K/UL (ref 150–400)
RBC COUNT: 3 M/UL — ABNORMAL LOW (ref 4.4–5.5)
WBC COUNT: 6.6 K/UL (ref 4.5–11.0)

## 2021-05-11 LAB — HEMOGLOBIN A1C: HEMOGLOBIN A1C: 6.1 % — ABNORMAL HIGH (ref 4.0–5.7)

## 2021-05-11 LAB — COMPREHENSIVE METABOLIC PANEL
ALBUMIN: 3.3 g/dL — ABNORMAL LOW (ref 3.5–5.0)
CO2: 30 MMOL/L (ref 21–30)
CREATININE: 1 mg/dL — ABNORMAL LOW (ref 0.4–1.24)
POTASSIUM: 4 MMOL/L (ref 3.5–5.1)
SODIUM: 135 MMOL/L — ABNORMAL LOW (ref 137–147)

## 2021-05-11 LAB — MICROALB/CR RATIO-URINE RANDOM
MICROALBUMIN, RAN: 10 ug/mL
MICROALBUMIN/CR RATIO URINE: 30 ug/mg — ABNORMAL HIGH (ref <30)
UR CREATININE, RAN: 35 mg/dL

## 2021-05-11 LAB — BNP (B-TYPE NATRIURETIC PEPTI): BNP: 761 pg/mL — ABNORMAL HIGH (ref 0–100)

## 2021-05-11 MED ORDER — METOLAZONE 2.5 MG PO TAB
2.5 mg | ORAL_TABLET | Freq: Every day | ORAL | 0 refills | 84.00000 days | Status: AC | PRN
Start: 2021-05-11 — End: ?

## 2021-05-11 MED ORDER — VENLAFAXINE 75 MG PO CP24
75 mg | ORAL_CAPSULE | Freq: Every day | ORAL | 3 refills | Status: AC
Start: 2021-05-11 — End: ?

## 2021-05-11 NOTE — Telephone Encounter
Pharmacy called concerned about rx sent today for metolazone 2.5 mg because a different provider Zollie Beckers ordered spironolactone 25 mg to take 25 mg twice a day.    Asking if patient should be taking both medications.    Routing to Dr. Crecencio Mc to advise  Ruben Grandchild, RN

## 2021-05-11 NOTE — Telephone Encounter
Lab orders printed and faxed to [facility].    Confirmation of fax transmission received.    Etta Grandchild, RN

## 2021-05-11 NOTE — Progress Notes
History of Present Illness  Ruben Wood. is a 81 y.o. male with a history of obesity, combined systolic and diastolic heart failure, coronary artery disease, type 2 diabetes, COPD/asthma with chronic hypoxic respiratory failure and bronchiectasis, OSA, CAD, hyperlipidemia is here today for comprehensive follow-up.    [name] is really struggling with severe muscle deconditioning.  He had a prolonged hospital stay at the [facility]  He is severely deconditioned.  He have difficulty even getting in and out of his vehicle.  He is doing physical therapy now.  This does seem to be helping a little bit, but the progress is slow.  He is concerned that he will not build to get back to his previous baseline.    He is also struggled with constipation.  Very hard stools.  He wondered about taking Metamucil.  He wants to avoid MiraLAX.    From a lung standpoint, he does have overlap with COPD as well as asthma.  He has complications from this including bronchiectasis as well as chronic hypoxic respiratory failure.  He gets frequent exacerbations.  We use a regimen of prednisone as well as Levaquin for this.  He is breathing more comfortably today.  He did recently start on a new medication, Nucala, for his asthma.    From a cardiac standpoint, he does have heart failure.  He follows in the heart failure center.  His CardioMEMS has not been working recently for some reason.  He is going to contact the cardiology clinic for that.  He does have increased lower extremity swelling today.  He has not noticed any increase shortness of breath or dyspnea on exertion, but is not very active.  He has been taking his Bumex 3 mg twice daily.  He has not been taking metolazone.  This on his medication list, but he was not aware of this.  He did miss a dose of his Bumex yesterday, but he otherwise is fully adherent.  He is trying to follow a low-salt diet.    His hypertension and hyperlipidemia under good control.    His weight is improving.  He is on Trulicity.  His weight is steadily improved, he is down about 40 pounds or so even with excess fluid weight that he has today.  He was 1 about going up on that dose as he is worried about plateauing on his weight loss.     Review of Systems  Per HPI    Objective:         ? acetaminophen (TYLENOL) 325 mg tablet Take two tablets by mouth every 6 hours as needed for Pain.   ? albuterol 0.083% (PROVENTIL) 2.5 mg /3 mL (0.083 %) nebulizer solution Inhale 3 mL solution by nebulizer as directed every 6 hours as needed for Wheezing or Shortness of Breath. Indications: asthma   ? albuterol sulfate (PROAIR HFA) 90 mcg/actuation HFA aerosol inhaler Inhale two puffs by mouth into the lungs every 4 hours as needed for Wheezing or Shortness of Breath.   ? allopurinoL (ZYLOPRIM) 300 mg tablet Take one tablet by mouth daily. take with food   ? amitr-gabapen-emu oil 05-24-08% topical cream (COMPOUND) Apply to affected areas twice daily as needed.   ? arformoteroL (BROVANA) 15 mcg/2 mL nebulizer solution Inhale 2 mL solution by nebulizer as directed twice daily. Dx: J44.9   ? ASCORBIC ACID-ASCORBATE SODIUM PO Take 500 mg by mouth every morning.   ? atorvastatin (LIPITOR) 40 mg tablet Take 1 tablet by  mouth once daily   ? azelastine (ASTELIN) 137 mcg (0.1 %) nasal spray Apply two sprays to each nostril as directed twice daily. Use in each nostril as directed   ? benzonatate (TESSALON PERLES) 100 mg capsule Take one capsule by mouth every 8 hours.   ? budesonide (PULMICORT) 0.25 mg/2 mL nebulizer solution Inhale 2 mL solution by nebulizer as directed twice daily. Dx: J44.9   ? bumetanide (BUMEX) 1 mg tablet TAKE 2 TABLETS BY MOUTH TWICE DAILY   ? cholecalciferol (VITAMIN D-3) 1,000 units tablet Take one tablet by mouth daily.   ? dulaglutide (TRULICITY) 0.75 mg/0.5 mL injection pen Inject 0.5 mL under the skin every 7 days. Indications: type 2 diabetes mellitus   ? finasteride (PROSCAR) 5 mg tablet Take one tablet by mouth daily.   ? fish oil- omega 3-DHA/EPA 300/1,000 mg capsule Take one capsule by mouth daily.   ? flash glucose scanning reader (FREESTYLE LIBRE) reader Use as directed.   ? flash glucose sensor (FREESTYLE LIBRE 14 DAY SENSOR) sensor Type 2 diabetes  Indications: type 2 diabetes mellitus   ? fluconazole (DIFLUCAN) 100 mg tablet    ? gabapentin (NEURONTIN) 100 mg capsule Take four capsules by mouth twice daily.   ? insulin pen needles (disposable) (BD ULTRA-FINE MINI PEN NEEDLE) 31 gauge x 3/16 pen needle Use with insulin pens   ? ipratropium bromide (ATROVENT) 21 mcg (0.03 %) nasal spray Apply two sprays to each nostril as directed every 12 hours.   ? levoFLOXacin (LEVAQUIN) 750 mg tablet Take one tablet by mouth daily.   ? magnesium oxide 400 mg magnesium capsule Take one capsule by mouth twice daily.   ? mepolizumab (NUCALA) 100 mg/mL injection pen Inject 100 mg under the skin every 28 days.   ? metOLazone (ZAROXOLYN) 2.5 mg tablet Take one tablet by mouth daily as needed. PER OUTPATIENT CARDIOLOGY PROVIDER AS NEEDED FOR FLUID RETENTION   ? Miscellaneous Medical Supply misc itted for LE gradual compression stockings at a medical supply store   ? Miscellaneous Medical Supply misc Vancomycin 1,500 mg IV every 24hours starting 06/25/20 x 4 days (14 total antibiotic days), please draw vancomycin level per your protocol at [facility].   ? Miscellaneous Medical Supply misc Rx: Please wrap legs with ACE bandage from toes as high up to the leg as possible bilaterally  Dx: Lymphedema   ? montelukast (SINGULAIR) 10 mg tablet Take one tablet by mouth at bedtime daily.   ? nitroglycerin (NITROSTAT) 0.4 mg tablet Place one tablet under tongue every 5 minutes as needed for Chest Pain. Max of 3 tablets, call 911.   ? nystatin (MYCOSTATIN) 100,000 unit/g topical cream Apply  topically to affected area twice daily.   ? nystatin (MYCOSTATIN) 100,000 unit/g topical ointment Apply to affected areas TID   ? nystatin (NYSTOP) 100,000 unit/g topical powder Apply  topically to affected area four times daily.   ? pantoprazole DR (PROTONIX) 40 mg tablet Take one tablet by mouth twice daily.   ? polyethylene glycol 3350 (MIRALAX) 17 g packet Take two packets by mouth twice daily.   ? rivaroxaban (XARELTO) 20 mg tablet Take one tablet by mouth daily with dinner. Take with food.   ? sodium chloride (HYPER-SAL) 3.5 % inhalation solution Inhale 4 mL by mouth into the lungs twice daily. 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 tamsulosin (FLOMAX) 0.4 mg capsule Take one capsule by mouth daily. Do not crush, chew or open capsules. Take 30 minutes following  the same meal each day.   ? traMADoL (ULTRAM) 50 mg tablet Take one tablet by mouth every 6 hours as needed for Pain.   ? venlafaxine XR (EFFEXOR XR) 75 mg capsule Take one capsule by mouth daily. Take with food.   ? zinc sulfate 220 mg (50 mg elemental zinc) capsule Take one capsule by mouth daily.     Vitals:    05/11/21 1040   BP: 109/66   BP Source: Arm, Right Upper   Pulse: 91   SpO2: 98%  Comment: 4L of o2   PainSc: Seven   Height: 180.3 cm (5' 11)       Body mass index is 36.54 kg/m?[name]     Wt Readings from Last 20 Encounters:   05/04/21 118.8 kg (262 lb)   05/04/21 118.8 kg (262 lb)   04/26/21 117.5 kg (259 lb)   04/19/21 117.5 kg (259 lb)   03/18/21 123.4 kg (272 lb)   03/08/21 123.4 kg (272 lb)   02/24/21 126.1 kg (278 lb)   02/08/21 127 kg (280 lb)   01/27/21 127 kg (280 lb)   01/12/21 128.8 kg (284 lb)   12/21/20 124 kg (273 lb 6.4 oz)   12/06/20 124.7 kg (275 lb)   11/24/20 127.2 kg (280 lb 6.8 oz)   11/12/20 127 kg (280 lb)   10/27/20 127 kg (280 lb)   10/27/20 127 kg (280 lb)   09/30/20 132.9 kg (293 lb)   08/27/20 131.3 kg (289 lb 8 oz)   08/12/20 135.6 kg (299 lb)   08/11/20 135.6 kg (299 lb)         Physical Exam  Vitals and nursing note reviewed.   Constitutional:       General: He is not in acute distress.     Appearance: He is well-developed. He is not diaphoretic.   HENT:      Head: Normocephalic and atraumatic.   Neck:      Thyroid: No thyromegaly.      Vascular: No JVD.   Cardiovascular:      Rate and Rhythm: Normal rate and regular rhythm.      Heart sounds: Normal heart sounds. No murmur heard.    No friction rub. No gallop.   Pulmonary:      Effort: Pulmonary effort is normal. No respiratory distress.      Breath sounds: Rales present. No wheezing.      Comments: Wearing oxygen  Abdominal:      General: Abdomen is flat. There is no distension.      Palpations: Abdomen is soft.   Musculoskeletal:      Right lower leg: Edema present.      Left lower leg: Edema present.   Neurological:      Mental Status: He is alert.   Psychiatric:      Comments: Depressed affect         Labwork reviewed:  Lab Results   Component Value Date/Time    HGBA1C 6.7 (H) 04/04/2020 05:40 AM    HGBA1C 5.9 11/25/2019 02:45 PM    HGBA1C 5.9 09/02/2019 09:50 AM    A1C 5.8 05/11/2021 12:00 AM    HGBPOC 14.3 04/08/2020 06:58 AM    HGBPOC 11.2 (L) 10/25/2012 10:58 AM    HGBPOC 12.9 (L) 10/25/2012 08:55 AM    MCALBR 7.2 05/18/2020 01:45 PM    TSH 2.49 03/05/2020 08:40 PM    FREET4R 0.99 12/15/2014 09:12 AM    CHOL 161 04/04/2020  05:40 AM    TRIG 152 (H) 04/04/2020 05:40 AM    HDL 53 04/04/2020 05:40 AM    LDL 87 04/04/2020 05:40 AM    NA 132 (A) 03/15/2021 12:00 AM    K 5.4 (A) 03/15/2021 12:00 AM    CL 95 (A) 03/15/2021 12:00 AM    CO2 29.2 03/15/2021 12:00 AM    GAP 13.2 (A) 03/15/2021 12:00 AM    BUN 52 (A) 03/15/2021 12:00 AM    CR 1.9 (A) 03/15/2021 12:00 AM    GLU 153 (A) 03/15/2021 12:00 AM    CA 9.8 03/15/2021 12:00 AM    PO4 4.1 03/08/2020 03:51 AM    ALBUMIN 2.8 (A) 03/15/2021 12:00 AM    TOTPROT 8.3 (A) 03/15/2021 12:00 AM    ALKPHOS 88 03/15/2021 12:00 AM    AST 20 03/15/2021 12:00 AM    ALT 16 03/15/2021 12:00 AM    TOTBILI 0.7 03/15/2021 12:00 AM    GFR 34 03/15/2021 12:00 AM    GFRAA >60 12/03/2019 04:51 AM    PSA 2.96 11/25/2018 12:00 AM              Assessment and Plan:    1. Severe muscle deconditioning    2. Type 2 diabetes mellitus with hyperglycemia, without long-term current use of insulin (HCC)    3. Constipation, unspecified constipation type    4. Other emphysema (HCC)    5. Coronary artery disease involving native coronary artery of native heart without angina pectoris    6. Essential hypertension    7. Hypercholesterolemia    8. Morbid obesity with BMI of 40.0-44.9, adult (HCC)    9. Chronic combined systolic (congestive) and diastolic (congestive) heart failure (HCC)      He is here today for comprehensive follow-up of the above problems.  For his muscle deconditioning, this is likely related to his recent hospital stay as well as general deconditioning from sedentary lifestyle.  I want him to continue with physical therapy.  I think that this will be very important for him.    His diabetes is doing very well.  I am encouraged by his weight loss with Trulicity.  I want to continue with the current regimen for now.  We did collect a urine microalbumin/creatinine ratio.    For his constipation, I advised that he start taking Metamucil every evening with his evening meal.  If he has not had a bowel movement by the midmorning the following day, wanted do a second round of the Metamucil then.    From a heart failure standpoint, he appears mildly decompensated today.  I talked him about adding an metolazone as needed.  We will need to be very careful with this and monitor his electrolytes very carefully.  I advised that he take the metolazone 30 minutes before he takes his morning Bumex dose.  I would not want him doing this more than a few days in a row without letting me or his heart failure team know.  He will get a BMP today to get a baseline creatinine, potassium, and sodium level.    For his hyperlipidemia, we want to be aggressive with his LDL.  Like his LDL under 70.  We will check a lipid panel today.    Total of 40 minutes were spent on the same day of the visit including preparing to see the patient, obtaining and/or reviewing separately obtained history, performing a medically appropriate examination and/or evaluation, counseling and educating the  patient/family/caregiver, ordering medications, tests, or procedures, referring and communication with other health care professionals, documenting clinical information in the electronic or other health record, independently interpreting results and communicating results to the patient/family/caregiver, and care coordination.         There are no Patient Instructions on file for this visit.    No follow-ups on file.

## 2021-05-11 NOTE — Telephone Encounter
I called pharmacy to advise.    Benjimen Kelley, RN

## 2021-05-11 NOTE — Patient Instructions
Future Appointments   Date Time Provider [place]   05/26/2021  4:00 PM MAC REMOTE MONITORING MACREMOTEHRM CVM Procedur   06/02/2021  1:00 PM [name], Jeronimo Greaves, PA-C [facility]   06/06/2021 10:00 AM Haskell Riling, MD [facility] CVM Exam   06/06/2021 10:00 AM  PACEMAKER MACKUHRM CVM Procedur   06/27/2021  4:00 PM MAC REMOTE MONITORING MACREMOTEHRM CVM Procedur   08/24/2021 11:00 AM Aida Puffer I, MD [facility] CVM Exam   10/26/2021 10:20 AM Silver Huguenin, MD [facility]       If you are on Mychart, you will now be able to read your clinic note from me.  My hope is that this will improve your engagement and insight into your health care and improve the accuracy of your medical record.  If you find inaccuracies, I apologize.  Please bring any concerns or inaccuracies to your next appointment.  Please remember that medical language and documentation is very different from how you and I speak and our dictation software is not perfect.    General Instructions:  How to reach me:   Please send a MyChart message to the General Medicine clinic or call Altha Harm at [phone].    How to get a medication refill:  Please use the MyChart Refill request or contact your pharmacy directly to request medication refills. Please allow 48 hours.     How to receive your test results:  If you have signed up for MyChart, you will receive your test results and messages from me this way.  Otherwise, you will get a phone call or letter.   If you are expecting results and have not heard from my office within 2 weeks of your testing, please send a MyChart message or call my office.     Scheduling:  Our Scheduling phone number is [phone].  Same Day appointments are usually available. Please ask for an annual or yearly physical appointment if it has been over 1 year since our last appointment.  Support groups for many chronic illnesses are available through [facility] [place]: [email or web address] or [phone].

## 2021-05-11 NOTE — Telephone Encounter
-----   Message from [name], MD sent at 05/11/2021  2:15 PM CDT -----  Ruben Wood, can you please fax labs to [facility] to have drawn on Monday next week?

## 2021-05-11 NOTE — Telephone Encounter
I think his spironolactone was stopped due to low BP/orthostasis/presyncope.     I want him to take the metolazone for a the next couple days. He clearly has some volume overload and he is not getting sufficient diuresis with bumex 3mg  BID.

## 2021-05-12 ENCOUNTER — Encounter: Admit: 2021-05-12 | Discharge: 2021-05-12 | Payer: MEDICARE

## 2021-05-12 DIAGNOSIS — I10 Essential (primary) hypertension: Secondary | ICD-10-CM

## 2021-05-12 DIAGNOSIS — I5042 Chronic combined systolic (congestive) and diastolic (congestive) heart failure: Secondary | ICD-10-CM

## 2021-05-13 ENCOUNTER — Encounter: Admit: 2021-05-13 | Discharge: 2021-05-13 | Payer: MEDICARE

## 2021-05-13 DIAGNOSIS — D649 Anemia, unspecified: Secondary | ICD-10-CM

## 2021-05-13 DIAGNOSIS — L89312 Pressure ulcer of right buttock, stage 2: Secondary | ICD-10-CM

## 2021-05-13 DIAGNOSIS — I5043 Acute on chronic combined systolic (congestive) and diastolic (congestive) heart failure: Secondary | ICD-10-CM

## 2021-05-13 LAB — BASIC METABOLIC PANEL
CHLORIDE: 95 — AB (ref 98–106)
POTASSIUM: 3.9
SODIUM: 133 — AB (ref 137–150)

## 2021-05-13 LAB — CBC
HEMATOCRIT: 30 — AB (ref 40–54)
HEMOGLOBIN: 9.4 — AB (ref 14–18)
MCH: 30
MCHC: 30 — AB (ref 31–36)
MCV: 98 — AB (ref 80–94)
MPV: 9.3
PLATELET COUNT: 306
RBC COUNT: 3.1 — AB (ref 4.6–6.20)
RDW: 17 — AB (ref 11–15)
WBC COUNT: 6.9

## 2021-05-13 NOTE — Telephone Encounter
Message received from Dr. Gibson Ramp stating he reviewed lab results and everything is stable from a hemoglobin standpoint.  We do not need to admit him now, but will need further work up    He will start with a fecal occult blood test.    His potassium and Cr are stable.  He should take a break  With the metolazone over the weekend.  He can take a dose on Monday if he still has swelling.      He wants patient to stay in close touch with [place] on fluid status and any signs of bleeding.    Verbal fecal orders placed per text message from Dr. Gibson Ramp and faxed to [facility] outpatient lab  My chart message with information sent to patient and I called patient's wife as well to advise.    She verbalizes understanding and will contact [facility] to get stool collection kit.    Routing to Dr. Gibson Ramp as Farris Has, RN

## 2021-05-16 ENCOUNTER — Encounter: Admit: 2021-05-16 | Discharge: 2021-05-16 | Payer: MEDICARE

## 2021-05-16 DIAGNOSIS — D649 Anemia, unspecified: Secondary | ICD-10-CM

## 2021-05-16 LAB — OCCULT BLOOD NON COLON CANCER SCREEN: OCCULT BLOOD: NEGATIVE

## 2021-05-17 ENCOUNTER — Encounter: Admit: 2021-05-17 | Discharge: 2021-05-17 | Payer: MEDICARE

## 2021-05-17 DIAGNOSIS — D649 Anemia, unspecified: Secondary | ICD-10-CM

## 2021-05-17 NOTE — Progress Notes
CM note-spoke with both patient and wife, Ruben Wood.   Short outreach due to being in the car    Patient reports his legs are not working like he "would like them too"  Continues to attend PT at [facility] 3 times a week.   [name] states she did pretty well walking to the bathroom today, just fatigued     They did pick up MVI with iron to start.   Still having trouble with RPM  Works intermittently.   Wife plans on calling tech support to try and get things working again.   CM sent RPM RN message to notify that wife will work on getting things working again this week. She has been busy with Ed's appointments as of late.     Wife and patient are aware that patient will need follow up labs in approximately 1 week.   Labs faxed to [facility] at [phone]  Successful fax confirmation received.     Will attempt next outreach in approximately 2 months.

## 2021-05-18 ENCOUNTER — Encounter: Admit: 2021-05-18 | Discharge: 2021-05-18 | Payer: MEDICARE

## 2021-05-18 NOTE — Progress Notes
Copay assistance of $1,007.29 was obtained for the specialty medication nucala using grant from [facility] and now the copay is $0.  [name] has stated this copay is affordable.  The specialty pharmacy will pursue additional copay assistance as necessary.  The specialty pharmacy will reach out to the ambulatory clinical pharmacist if the copay becomes unaffordable.    The medication will be placed in will call to be picked up by clinic staff for clinic administration..    The ambulatory pharmacist has been notified of the approval in order to provide education prior to dispense of the medication.  Will await notification from the pharmacist that it is OK to set up the fill per the patient's preferred delivery method.    Mcneil Sober  Pharmacy Patient Advocate   [phone]

## 2021-05-19 ENCOUNTER — Encounter: Admit: 2021-05-19 | Discharge: 2021-05-19 | Payer: MEDICARE

## 2021-05-19 DIAGNOSIS — R7303 Prediabetes: Secondary | ICD-10-CM

## 2021-05-19 DIAGNOSIS — J438 Other emphysema: Secondary | ICD-10-CM

## 2021-05-19 DIAGNOSIS — I2510 Atherosclerotic heart disease of native coronary artery without angina pectoris: Secondary | ICD-10-CM

## 2021-05-19 DIAGNOSIS — J479 Bronchiectasis, uncomplicated: Secondary | ICD-10-CM

## 2021-05-19 DIAGNOSIS — I4901 Ventricular fibrillation: Secondary | ICD-10-CM

## 2021-05-19 DIAGNOSIS — I739 Peripheral vascular disease, unspecified: Secondary | ICD-10-CM

## 2021-05-19 DIAGNOSIS — I255 Ischemic cardiomyopathy: Secondary | ICD-10-CM

## 2021-05-19 DIAGNOSIS — I5043 Acute on chronic combined systolic (congestive) and diastolic (congestive) heart failure: Secondary | ICD-10-CM

## 2021-05-19 MED ORDER — AMIODARONE 400 MG PO TAB
ORAL_TABLET | ORAL | 1 refills | Status: CN
Start: 2021-05-19 — End: ?

## 2021-05-19 MED FILL — NUCALA 100 MG/ML SC ATIN: 100 mg/mL | SUBCUTANEOUS | 28 days supply | Qty: 1 | Fill #1 | Status: AC

## 2021-05-19 NOTE — Telephone Encounter
YMR notified while in clinic.

## 2021-05-19 NOTE — Telephone Encounter
-----   Message from [place], RN sent at 05/19/2021  7:58 AM CDT -----  Regarding: YMR 36J Shock delivered for VF episode  Hi,     We received a red alert transmission for High Voltage Therapy Delivered. There was a VF episode 3/29 @ 12:14 lasting 14 seconds with average ventricular rate ~274 bpm. 1 36J shock was delivered which terminated the VF episode.     The remote also reports an NSVT episode with no EGM for review on 3/28 @ 12:32. The device reports a duration of 8 seconds with average V. Rate 194 bpm.     BiV pacing is suboptimal. The device reports BiV pacing at 84%.    Thanks,   Brittney/device team

## 2021-05-19 NOTE — Telephone Encounter
Spoke to patient.  Reviewed remote showing NSVT with 36 J shock delivered.  Patient reports he was at PT during this time.  Patient states he felt "funny" but did not feel shock delivered.  He denies weight gain, SOA, BLE, CP, lightheaded/dizziness.  He denies near syncope/syncopal episode.  He reports PCP changed Cymbalta to Effexor last week, otherwise denies med changed.  He endorses taking meds as prescribed.  He does not have recent BP available.  Per YMR OV note, "We reviewed his CRT-D interrogation that evidenced episodes of tachyarrhythmia that were read as NSVTs. We reviewed them and they actually seem to be SVT instead. Mr. Roessner hasn't noted this episodes either. We discussed about the significance of this finding and we are going to monitor for further changes in his clinical status to decide if he will need additional treatment to suppress these events." Will review YMR for recs.

## 2021-05-19 NOTE — Progress Notes
Pharmacy Medication Initial Assessment and Education    The patient's caregiver (wife) participated on the patient's behalf. All references to the patient herein were completed with the caregiver on the patient's behalf.    Indication/Regimen  The regimen of NUCALA 100 MG/ML SC ATIN indefinitely is appropriate for Ruben Wood. who has Asthma.    Renal dose adjustments are not required. Hepatic dose adjustments are not required. Dose titration is not required.    The medication(s) will be administered in clinic.    Baseline Characteristics  Previous asthma medications: Prednisone  Current asthma medications: Brovana, Pulmicort, Nucala  Inadequate control with inhaled glucocorticoids: yes    Therapeutic Goals and Monitoring  The goal of therapy is to decrease the rate of asthma exacerbations.    Evaluation (past 30 days):  Hospitalizations for asthma: 0  ED/urgent care visits for asthma: 0    Past Medical History and Comorbidities  Patient Active Problem List   Diagnosis   ? Chest wall deformity   ? OSA on CPAP   ? COPD (chronic obstructive pulmonary disease) (HCC)   ? Morbid obesity with BMI of 40.0-44.9, adult (HCC)   ? Essential hypertension   ? CAD (coronary artery disease), native coronary artery   ? History of MRSA infection   ? AAA (abdominal aortic aneurysm)   ? History of repair of aneurysm of abdominal aorta using endovascular stent graft   ? Stage 3 chronic kidney disease (HCC)   ? Mixed dyslipidemia   ? Chronic total occlusion of native coronary artery   ? Acute on chronic combined systolic and diastolic congestive heart failure, NYHA class 2 (HCC)   ? Osteoarthritis of spine with radiculopathy, lumbar region   ? Cellulitis of left lower extremity   ? Mixed restrictive and obstructive lung disease (HCC)   ? Bronchiectasis without complication (HCC)   ? Hypercholesterolemia   ? ICD (implantable cardioverter-defibrillator), biventricular, in situ   ? Ischemic cardiomyopathy   ? S/P left pulmonary artery pressure sensor implant placement (CardioMEMs)    ? Moderate persistent asthma with acute exacerbation   ? Dermatographic urticaria   ? Recurrent infections   ? Hypogammaglobulinemia (HCC)   ? Spinal stenosis of lumbosacral region   ? Spondylolisthesis of lumbosacral region   ? Osteoarthritis of spine with radiculopathy, lumbosacral region   ? Lumbar post-laminectomy syndrome   ? Spondylolisthesis, lumbar region   ? Enrolled in chronic care management   ? History of elevated prostate specific antigen (PSA)   ? 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 Complex care coordination   ? Bronchiectasis with acute lower respiratory infection (HCC)   ? Coronary artery disease due to lipid rich plaque   ? Benign prostatic hyperplasia (BPH)   ? OAB (overactive bladder)   ? Urinary incontinence, urge   ? Depression   ? Chronic respiratory failure with hypercapnia (HCC)   ? Orthostatic hypotension   ? Moderate episode of recurrent major depressive disorder (HCC)   ? Type 2 diabetes mellitus with hyperglycemia, without long-term current use of insulin (HCC)   ? Atrial fibrillation (HCC)   ? Chronic bronchitis with acute exacerbation (HCC)   ? Chronic combined systolic (congestive) and diastolic (congestive) heart failure (HCC)   ? Chronic bronchitis (HCC)   ? Oxygen dependent   ? Dyspnea   ? Gout   ? Epistaxis   ? Peripheral vascular disease (HCC)   ? Iron deficiency anemia   ? Sacroiliitis (HCC)  Additional comorbidities: no    Labs and Diagnostic Tests  Lab Results   Component Value Date    FVCPRE 2.32 01/16/2017    FVCPRE 2.72 10/18/2016    FVCPRE 3.01 12/22/2014    FVCPREDPRE 53 01/16/2017    FVCPREDPRE 63 10/18/2016    FVCPREDPRE 72 12/22/2014    FEV1PRE 1.65 01/16/2017    FEV1PRE 2.17 10/18/2016    FEV1PRE 2.38 12/22/2014    FEV1PREDPRE 53 01/16/2017    FEV1PREDPRE 70 10/18/2016    FEV1PREDPRE 79 12/22/2014     Allergies   Allergen Reactions   ? Chlorhexidine BLISTERS and EDEMA Immunizations  Vaccine history was reviewed with the patient. Education was provided on the importance of completing vaccines, including annual influenza vaccine.    Immunization History   Administered Date(s) Administered   ? COVID-19 (MODERNA BOOSTER), mRNA vacc, 50 mcg/0.25 mL (PF) 05/18/2020   ? COVID-19 (MODERNA), mRNA vacc, 100 mcg/0.5 mL (PF) 04/10/2019, 05/09/2019   ? COVID-19 Bivalent Booster (71YR+)(PFIZER), mRNA vacc, 76mcg/0.3mL 12/06/2020   ? FLU VACCINE >3YO (Preservative Free) 02/07/1999, 11/30/2008, 12/20/2010   ? Flu Vaccine =>3 YO (Historical) 11/22/2009, 12/03/2012   ? Flu Vaccine =>65 YO High-Dose (PF) 11/09/2015, 11/13/2016, 12/18/2017   ? Flu Vaccine =>65 YO High-Dose Quadrivalent (PF) 12/03/2019, 12/06/2020   ? Flu Vaccine Quadrivalent Recombinant =>18 YO PF 02/07/2019   ? Flu Vaccine Trivalent >64 Yo High-dose (Preservative Free) 11/21/2013, 12/04/2014   ? Pneumococcal Vaccine (23-Val Adult) 02/19/2006, 09/11/2006, 04/27/2016, 05/18/2020   ? Pneumococcal Vaccine(13-Val Peds/immunocompromised adult) 05/11/2012, 12/04/2014   ? Varicella-Zoster Vaccine - live (ZOSTAVAX) 12/27/2012     Medication Reconciliation  Medication history and reconciliation were performed (including prescription medications, supplements, over the counter, and herbal products). The medication list was updated and the patient's current medication list is included. The patient was instructed to speak with their health care provider before starting any new drug, including prescription or over the counter, natural / herbal products, or vitamins.    Drug Interactions    Drug-Drug Interactions  Drug-drug interactions were evaluated. There were not clinically significant drug-drug interactions.     Drug-Food Interactions  Drug-food interactions were not evaluated (NA - not oral).    Home Medications    Medication Sig   acetaminophen (TYLENOL) 325 mg tablet Take two tablets by mouth every 6 hours as needed for Pain.   albuterol 0.083% (PROVENTIL) 2.5 mg /3 mL (0.083 %) nebulizer solution Inhale 3 mL solution by nebulizer as directed every 6 hours as needed for Wheezing or Shortness of Breath. Indications: asthma   albuterol sulfate (PROAIR HFA) 90 mcg/actuation HFA aerosol inhaler Inhale two puffs by mouth into the lungs every 4 hours as needed for Wheezing or Shortness of Breath.   allopurinoL (ZYLOPRIM) 300 mg tablet Take one tablet by mouth daily. take with food   amitr-gabapen-emu oil 05-24-08% topical cream (COMPOUND) Apply to affected areas twice daily as needed.   arformoteroL (BROVANA) 15 mcg/2 mL nebulizer solution Inhale 2 mL solution by nebulizer as directed twice daily. Dx: J44.9   ASCORBIC ACID-ASCORBATE SODIUM PO Take 500 mg by mouth every morning.   atorvastatin (LIPITOR) 40 mg tablet Take 1 tablet by mouth once daily   azelastine (ASTELIN) 137 mcg (0.1 %) nasal spray Apply two sprays to each nostril as directed twice daily. Use in each nostril as directed   benzonatate (TESSALON PERLES) 100 mg capsule Take one capsule by mouth every 8 hours.   budesonide (PULMICORT) 0.25 mg/2 mL nebulizer solution Inhale  2 mL solution by nebulizer as directed twice daily. Dx: J44.9   bumetanide (BUMEX) 1 mg tablet TAKE 2 TABLETS BY MOUTH TWICE DAILY   cholecalciferol (VITAMIN D-3) 1,000 units tablet Take one tablet by mouth daily.   dulaglutide (TRULICITY) 0.75 mg/0.5 mL injection pen Inject 0.5 mL under the skin every 7 days. Indications: type 2 diabetes mellitus   finasteride (PROSCAR) 5 mg tablet Take one tablet by mouth daily.   fish oil- omega 3-DHA/EPA 300/1,000 mg capsule Take one capsule by mouth daily.   flash glucose scanning reader (FREESTYLE LIBRE) reader Use as directed.   flash glucose sensor (FREESTYLE LIBRE 14 DAY SENSOR) sensor Type 2 diabetes  Indications: type 2 diabetes mellitus   fluconazole (DIFLUCAN) 100 mg tablet    gabapentin (NEURONTIN) 100 mg capsule Take four capsules by mouth twice daily.   insulin pen needles (disposable) (BD ULTRA-FINE MINI PEN NEEDLE) 31 gauge x 3/16 pen needle Use with insulin pens   ipratropium bromide (ATROVENT) 21 mcg (0.03 %) nasal spray Apply two sprays to each nostril as directed every 12 hours.   levoFLOXacin (LEVAQUIN) 750 mg tablet Take one tablet by mouth daily.   magnesium oxide 400 mg magnesium capsule Take one capsule by mouth twice daily.   mepolizumab (NUCALA) 100 mg/mL injection pen Inject 100 mg under the skin every 28 days.   metOLazone (ZAROXOLYN) 2.5 mg tablet Take one tablet by mouth daily as needed. PER OUTPATIENT CARDIOLOGY PROVIDER AS NEEDED FOR FLUID RETENTION   Miscellaneous Medical Supply misc itted for LE gradual compression stockings at a medical supply store   Miscellaneous Medical Supply misc Vancomycin 1,500 mg IV every 24hours starting 06/25/20 x 4 days (14 total antibiotic days), please draw vancomycin level per your protocol at [facility].   Miscellaneous Medical Supply misc Rx: Please wrap legs with ACE bandage from toes as high up to the leg as possible bilaterally  Dx: Lymphedema   montelukast (SINGULAIR) 10 mg tablet Take one tablet by mouth at bedtime daily.   nitroglycerin (NITROSTAT) 0.4 mg tablet Place one tablet under tongue every 5 minutes as needed for Chest Pain. Max of 3 tablets, call 911.   nystatin (MYCOSTATIN) 100,000 unit/g topical cream Apply  topically to affected area twice daily.   nystatin (MYCOSTATIN) 100,000 unit/g topical ointment Apply to affected areas TID   nystatin (NYSTOP) 100,000 unit/g topical powder Apply  topically to affected area four times daily.   pantoprazole DR (PROTONIX) 40 mg tablet Take one tablet by mouth twice daily.   polyethylene glycol 3350 (MIRALAX) 17 g packet Take two packets by mouth twice daily.   rivaroxaban (XARELTO) 20 mg tablet Take one tablet by mouth daily with dinner. Take with food.   sodium chloride (HYPER-SAL) 3.5 % inhalation solution Inhale 4 mL by mouth into the lungs twice daily. Dx: J47.9   tamsulosin (FLOMAX) 0.4 mg capsule Take one capsule by mouth daily. Do not crush, chew or open capsules. Take 30 minutes following the same meal each day.   traMADoL (ULTRAM) 50 mg tablet Take one tablet by mouth every 6 hours as needed for Pain.   venlafaxine XR (EFFEXOR XR) 75 mg capsule Take one capsule by mouth daily. Take with food.   zinc sulfate 220 mg (50 mg elemental zinc) capsule Take one capsule by mouth daily.     Adverse Drug Reactions  Patient was educated on common side effects.    Adherence  Patient was educated on the importance of  adherence.    Safety Precautions    Risk Evaluation and Mitigation (REMS) Assessment: REMS is not required for this medication.    Safety precautions were addressed and discussed with the patient as applicable.    Contraindications: Ruben Wood. does not have contraindications to this medication.      Pregnancy Status: Male, education not applicable.    Medication Education  The patient was counseled via telephone. 10 minutes were spent educating the patient.    Ruben Wood. was provided with education on their specialty medication(s). Discussion with the patient included: the medication name, regimen, dosing, frequency, duration, proper administration, monitoring, common side effects, contraindications, safety precautions, and food/drug interactions to be aware of. Appropriate storage, safe handling, and disposal directions were reviewed with the patient. Emphasis was placed on the importance of medication compliance. The patient's ability to self-administer medication was assessed. Requirements of the REMS program were discussed with the patient as applicable. Recommended vaccinations were reviewed and discussed with the patient as applicable. The patient was instructed to seek medical attention immediately if they experience signs of an allergic reaction, including but not limited to: a rash; hives; itching; red, swollen, blistered, or peeling skin with or without fever.    Patient was educated on proper administration technique as well as storage/disposal. The patient demonstrated correct technique after instruction.    Ruben Wood. was given the opportunity to ask questions but did not have any questions at the time. Patient was reminded of the refill process and encouraged to call with questions. The monitoring and follow-up plan was discussed with the patient. The patient was instructed to contact their health care provider if their symptoms or health problems do not get better or if they become worse. The patient should contact the specialty pharmacy at [phone] if they have any questions or concerns regarding their medication therapy. The patient verbalized acceptance and understanding.    Follow-up Plan  The patient will be reassessed within 1 month of starting the medication.    The medication(s) will be picked up at [facility].    Prentiss Bells, PHARMD

## 2021-05-19 NOTE — Telephone Encounter
Ruben Addison, NP reviewed remote. Confirms true VFib. Recommends starting Amiodarone 400mg  BID for 10 days, then decrease to 400mg  daily thereafter. And recommends heart catheterization procedure.     Spoke with patient and wife. Patient states Dr. Cedric Wood, his Pulmonologist, and PCP Dr. Crecencio Wood have both said patient should not undergo anesthesia d/t lung function. Will clarify with patient's care team.

## 2021-05-20 ENCOUNTER — Inpatient Hospital Stay: Admit: 2021-05-20 | Payer: MEDICARE

## 2021-05-20 ENCOUNTER — Encounter: Admit: 2021-05-20 | Discharge: 2021-05-20 | Payer: MEDICARE

## 2021-05-20 ENCOUNTER — Inpatient Hospital Stay: Admit: 2021-05-20 | Discharge: 2021-05-20 | Payer: MEDICARE

## 2021-05-20 DIAGNOSIS — I509 Heart failure, unspecified: Secondary | ICD-10-CM

## 2021-05-20 LAB — MAGNESIUM
MAGNESIUM: 1.3 mg/dL — ABNORMAL LOW (ref 1.6–2.6)
MAGNESIUM: 1.7 mg/dL — ABNORMAL LOW (ref 1.6–2.6)

## 2021-05-20 LAB — BASIC METABOLIC PANEL
ANION GAP: 11 (ref 3–12)
BLD UREA NITROGEN: 55 mg/dL — ABNORMAL HIGH (ref 7–25)
CALCIUM: 8.3 mg/dL — ABNORMAL LOW (ref 8.5–10.6)
CHLORIDE: 98 MMOL/L (ref 98–110)
CO2: 30 MMOL/L (ref 21–30)
CREATININE: 1.2 mg/dL — ABNORMAL HIGH (ref 0.4–1.24)
EGFR: 56 mL/min — ABNORMAL LOW (ref >60)
GLUCOSE,PANEL: 131 mg/dL — ABNORMAL HIGH (ref 70–100)
SODIUM: 139 MMOL/L (ref 137–147)

## 2021-05-20 LAB — CBC AND DIFF
ABSOLUTE BASO COUNT: 0 K/UL (ref 0–0.20)
ABSOLUTE EOS COUNT: 0 K/UL (ref 0–0.45)
ABSOLUTE MONO COUNT: 0.1 K/UL (ref 0–0.80)
WBC COUNT: 6.3 K/UL (ref 4.5–11.0)

## 2021-05-20 LAB — URINALYSIS DIPSTICK REFLEX TO CULTURE
NITRITE: NEGATIVE
URINE ASCORBIC ACID, UA: POSITIVE — AB
URINE BILE: NEGATIVE
URINE BLOOD: NEGATIVE
URINE KETONE: NEGATIVE

## 2021-05-20 LAB — URINALYSIS MICROSCOPIC REFLEX TO CULTURE

## 2021-05-20 LAB — COMPREHENSIVE METABOLIC PANEL
ALBUMIN: 3.3 g/dL — ABNORMAL LOW (ref 3.5–5.0)
ALK PHOSPHATASE: 63 U/L — ABNORMAL LOW (ref 25–110)
ALT: 10 U/L (ref 7–56)
ANION GAP: 15 K/UL — ABNORMAL HIGH (ref 3–12)
AST: 14 U/L — ABNORMAL LOW (ref 7–40)
BLD UREA NITROGEN: 66 mg/dL — ABNORMAL HIGH (ref 7–25)
CALCIUM: 8.5 mg/dL — ABNORMAL HIGH (ref 8.5–10.6)
CO2: 27 MMOL/L (ref 21–30)
CREATININE: 1.5 mg/dL — ABNORMAL HIGH (ref 0.4–1.24)
EGFR: 46 mL/min — ABNORMAL LOW (ref >60)
SODIUM: 136 MMOL/L — ABNORMAL LOW (ref 137–147)
TOTAL BILIRUBIN: 0.5 mg/dL (ref 0.3–1.2)
TOTAL PROTEIN: 6.4 g/dL (ref 6.0–8.0)

## 2021-05-20 LAB — POC GLUCOSE
POC GLUCOSE: 177 mg/dL — ABNORMAL HIGH (ref 70–100)
POC GLUCOSE: 143 mg/dL — ABNORMAL HIGH (ref 70–100)

## 2021-05-20 LAB — PROTIME INR (PT)
INR: 2 mg/dL — ABNORMAL HIGH (ref 0.8–1.2)
PROTIME: 23 s — ABNORMAL HIGH (ref 9.5–14.2)

## 2021-05-20 LAB — BNP (B-TYPE NATRIURETIC PEPTI): BNP: 103 pg/mL — ABNORMAL HIGH (ref 0–100)

## 2021-05-20 MED ORDER — ALBUTEROL SULFATE 2.5 MG /3 ML (0.083 %) IN NEBU
2.5 mg | RESPIRATORY_TRACT | 0 refills | Status: DC | PRN
Start: 2021-05-20 — End: 2021-05-20
  Administered 2021-05-20: 23:00:00 2.5 mg via RESPIRATORY_TRACT

## 2021-05-20 MED ORDER — MAGNESIUM SULFATE IN D5W 1 GRAM/100 ML IV PGBK
1 g | INTRAVENOUS | 0 refills | Status: DC
Start: 2021-05-20 — End: 2021-05-20

## 2021-05-20 MED ORDER — FINASTERIDE 5 MG PO TAB
5 mg | Freq: Every day | ORAL | 0 refills | Status: AC
Start: 2021-05-20 — End: ?
  Administered 2021-05-21 – 2021-06-03 (×14): 5 mg via ORAL

## 2021-05-20 MED ORDER — MONTELUKAST 10 MG PO TAB
10 mg | Freq: Every evening | ORAL | 0 refills | Status: AC
Start: 2021-05-20 — End: ?
  Administered 2021-05-21 – 2021-06-03 (×14): 10 mg via ORAL

## 2021-05-20 MED ORDER — ACETAMINOPHEN 500 MG PO TAB
500 mg | ORAL | 0 refills | Status: AC | PRN
Start: 2021-05-20 — End: ?
  Administered 2021-05-28 – 2021-05-31 (×2): 500 mg via ORAL

## 2021-05-20 MED ORDER — ATORVASTATIN 40 MG PO TAB
40 mg | Freq: Every day | ORAL | 0 refills | Status: AC
Start: 2021-05-20 — End: ?
  Administered 2021-05-21 – 2021-06-03 (×14): 40 mg via ORAL

## 2021-05-20 MED ORDER — INSULIN ASPART 100 UNIT/ML SC FLEXPEN
0-6 [IU] | Freq: Before meals | SUBCUTANEOUS | 0 refills | Status: AC
Start: 2021-05-20 — End: ?
  Administered 2021-05-22: 17:00:00 1 [IU] via SUBCUTANEOUS

## 2021-05-20 MED ORDER — IPRATROPIUM BROMIDE 42 MCG (0.06 %) NA SPRY
1 | Freq: Two times a day (BID) | NASAL | 0 refills | Status: AC
Start: 2021-05-20 — End: ?
  Administered 2021-05-21: 02:00:00 1 via NASAL

## 2021-05-20 MED ORDER — MAGNESIUM SULFATE IN WATER 4 GRAM/50 ML (8 %) IV PGBK
4 g | Freq: Once | INTRAVENOUS | 0 refills | Status: AC
Start: 2021-05-20 — End: ?
  Administered 2021-05-21: 04:00:00 4 g via INTRAVENOUS

## 2021-05-20 MED ORDER — POLYETHYLENE GLYCOL 3350 17 GRAM PO PWPK
34 g | Freq: Two times a day (BID) | ORAL | 0 refills | Status: AC
Start: 2021-05-20 — End: ?
  Administered 2021-05-21 (×2): 34 g via ORAL

## 2021-05-20 MED ORDER — SODIUM CHLORIDE 3 % IN NEBU
Freq: Two times a day (BID) | RESPIRATORY_TRACT | 0 refills | Status: AC
Start: 2021-05-20 — End: ?
  Administered 2021-05-20 – 2021-06-03 (×26): 4 mL via RESPIRATORY_TRACT

## 2021-05-20 MED ORDER — POTASSIUM CHLORIDE 20 MEQ PO TBTQ
60 meq | Freq: Once | ORAL | 0 refills | Status: CP
Start: 2021-05-20 — End: ?
  Administered 2021-05-20: 20:00:00 60 meq via ORAL

## 2021-05-20 MED ORDER — PANTOPRAZOLE 40 MG PO TBEC
40 mg | Freq: Two times a day (BID) | ORAL | 0 refills | Status: AC
Start: 2021-05-20 — End: ?
  Administered 2021-05-21 – 2021-06-03 (×28): 40 mg via ORAL

## 2021-05-20 MED ORDER — ALLOPURINOL 300 MG PO TAB
300 mg | Freq: Every day | ORAL | 0 refills | Status: AC
Start: 2021-05-20 — End: ?
  Administered 2021-05-21 – 2021-06-03 (×14): 300 mg via ORAL

## 2021-05-20 MED ORDER — VENLAFAXINE 75 MG PO CP24
75 mg | Freq: Every day | ORAL | 0 refills | Status: AC
Start: 2021-05-20 — End: ?
  Administered 2021-05-21 – 2021-06-03 (×14): 75 mg via ORAL

## 2021-05-20 MED ORDER — POTASSIUM CHLORIDE 20 MEQ PO TBTQ
60 meq | Freq: Once | ORAL | 0 refills | Status: CP
Start: 2021-05-20 — End: ?
  Administered 2021-05-21: 04:00:00 60 meq via ORAL

## 2021-05-20 MED ORDER — MAGNESIUM SULFATE IN WATER 4 GRAM/50 ML (8 %) IV PGBK
4 g | Freq: Once | INTRAVENOUS | 0 refills | Status: CP
Start: 2021-05-20 — End: ?
  Administered 2021-05-20: 20:00:00 4 g via INTRAVENOUS

## 2021-05-20 MED ORDER — ALBUTEROL SULFATE 2.5 MG /3 ML (0.083 %) IN NEBU
2.5 mg | RESPIRATORY_TRACT | 0 refills | Status: AC | PRN
Start: 2021-05-20 — End: ?
  Administered 2021-05-21 – 2021-06-01 (×47): 2.5 mg via RESPIRATORY_TRACT

## 2021-05-20 MED ORDER — CHOLECALCIFEROL (VITAMIN D3) 25 MCG (1,000 UNIT) PO TAB
1000 [IU] | Freq: Every day | ORAL | 0 refills | Status: AC
Start: 2021-05-20 — End: ?
  Administered 2021-05-21 – 2021-06-03 (×14): 1000 [IU] via ORAL

## 2021-05-20 MED ORDER — ARFORMOTEROL 15 MCG/2 ML IN NEBU
15 ug | Freq: Two times a day (BID) | RESPIRATORY_TRACT | 0 refills | Status: AC
Start: 2021-05-20 — End: ?
  Administered 2021-05-20 – 2021-06-03 (×26): 15 ug via RESPIRATORY_TRACT

## 2021-05-20 MED ORDER — DEXTROSE 50 % IN WATER (D50W) IV SYRG
12.5-25 g | INTRAVENOUS | 0 refills | Status: AC | PRN
Start: 2021-05-20 — End: ?

## 2021-05-20 MED ORDER — IMS MIXTURE TEMPLATE
400 mg | Freq: Two times a day (BID) | ORAL | 0 refills | Status: AC
Start: 2021-05-20 — End: ?
  Administered 2021-05-21 – 2021-06-03 (×55): 400 mg via ORAL

## 2021-05-20 MED ORDER — BUDESONIDE 0.25 MG/2 ML IN NBSP
.25 mg | Freq: Two times a day (BID) | RESPIRATORY_TRACT | 0 refills | Status: AC
Start: 2021-05-20 — End: ?
  Administered 2021-05-22 – 2021-06-02 (×20): 0.25 mg via RESPIRATORY_TRACT

## 2021-05-20 MED ORDER — RIVAROXABAN 20 MG PO TAB
20 mg | Freq: Every day | ORAL | 0 refills | Status: AC
Start: 2021-05-20 — End: ?
  Administered 2021-05-20: 22:00:00 20 mg via ORAL

## 2021-05-20 MED ORDER — TAMSULOSIN 0.4 MG PO CAP
.4 mg | Freq: Every day | ORAL | 0 refills | Status: AC
Start: 2021-05-20 — End: ?
  Administered 2021-05-21 – 2021-06-03 (×14): 0.4 mg via ORAL

## 2021-05-20 MED ORDER — ENOXAPARIN 40 MG/0.4 ML SC SYRG
40 mg | Freq: Every day | SUBCUTANEOUS | 0 refills | Status: DC
Start: 2021-05-20 — End: 2021-05-20

## 2021-05-20 MED ORDER — ZINC SULFATE 50 MG ZINC (220 MG) PO CAP
220 mg | Freq: Every day | ORAL | 0 refills | Status: AC
Start: 2021-05-20 — End: ?
  Administered 2021-05-21 – 2021-06-03 (×14): 220 mg via ORAL

## 2021-05-20 MED ORDER — BUMETANIDE 0.25 MG/ML IJ SOLN
3 mg | Freq: Two times a day (BID) | INTRAVENOUS | 0 refills | Status: AC
Start: 2021-05-20 — End: ?
  Administered 2021-05-20 – 2021-05-23 (×6): 3 mg via INTRAVENOUS

## 2021-05-20 MED ORDER — OMEGA 3-DHA-EPA-FISH OIL 300-1,000 MG PO CPDR
1000 mg | Freq: Every day | ORAL | 0 refills | Status: AC
Start: 2021-05-20 — End: ?
  Administered 2021-05-21 – 2021-06-03 (×14): 1000 mg via ORAL

## 2021-05-20 MED ORDER — MELATONIN 5 MG PO TAB
5 mg | Freq: Every evening | ORAL | 0 refills | Status: AC | PRN
Start: 2021-05-20 — End: ?
  Administered 2021-05-22 – 2021-06-02 (×7): 5 mg via ORAL

## 2021-05-20 MED ORDER — BACITRACIN ZINC 500 UNIT/GRAM TP OINT
Freq: Two times a day (BID) | TOPICAL | 0 refills | Status: AC
Start: 2021-05-20 — End: ?
  Administered 2021-05-21: 02:00:00 via TOPICAL

## 2021-05-20 NOTE — Care Plan
Problem: Discharge Planning  Goal: Participation in plan of care  Outcome: Goal Ongoing  Goal: Knowledge regarding plan of care  Outcome: Goal Ongoing  Goal: Prepared for discharge  Outcome: Goal Ongoing     Problem: High Fall Risk  Goal: High Fall Risk  Outcome: Goal Ongoing     Problem: Self-Care Deficit  Goal: Maximize Heart Failure Knowledge  Outcome: Goal Ongoing  Goal: Maximize Heart Failure Attitudes  Outcome: Goal Ongoing  Goal: Maximize Heart Failure Behaviors  Outcome: Goal Ongoing

## 2021-05-20 NOTE — Progress Notes
Patient arrived on unit via wheelchair accompanied by family. Patient transferred to the bed with assistance. Assessment completed, refer to flowsheet for details. Orders released, reviewed, and implemented as appropriate. Oriented to surroundings, call light within reach. Plan of care reviewed.  Will continue to monitor and assess.

## 2021-05-20 NOTE — Consults
Wound Ostomy Note    NAME:Ruben Wood.                                                                   MRN: [id number]                 DOB:Jun 25, 1940          AGE: 81 y.o.  ADMISSION DATE: 05/20/2021             DAYS ADMITTED: LOS: 0 days      Reason for Consult/Visit:   pressure injury Stage II or greater and wound not pressure    Assessment/Plan:    Principal Problem:    Heart failure ([place])    Consult for - Pressure wound- Buttocks, left lower breast, right hand        Bilateral buttocks with blanchable red dry intact skin. Encouraged to turn/ offload area.     Right hand with old abrasion. Now with dry intact scab. No erythema. No drainage.     Under breast with dry intact red skin.       Bedside Nursing to follow Nursing Standards of Practice and/or discuss with Primary Team.  No Wound Consult needed for MASD, blanchable red skin or abrasions.       Recommend:     Under Breast Moisture Associated Skin Damage:   Wash affected areas with soap and water daily. Dry well.  Apply Antifungal Barrier cream TID and PRN.      Buttocks: HAPI Prevention   - Turn q2 hours using foam wedge for support.  - Apply  VIT A&D TID and PRN to protect skin from moisture related to urine/stool.  - Avoid briefs and use only one disposable pad at at time to prevent heat and moisture trapping against skin.  - Shift weight Q 15 minutes when in chair  - HOB less than or equal to 30 degrees, unless contraindicated, to prevent shearing at coccyx/sacrum.  - Optimize NUTRITION  - Low Air Loss Mattress- order air pump if needed    Right hand abrasion  Bacitracin BID          Wound Team will sign off.       Guadlupe Spanish RN, BSN  Wound/ Ostomy Nursing Consult Service  Available via [facility] Text or [employer]ator M-F 07:00-15:30  Office: [phone]  for questions after hours/weekend/holidays page [phone] or volte text wound ostomy on call nurse.

## 2021-05-20 NOTE — Progress Notes
Daily CardioMEMS Readings    Goal PA Diastolic: 14-15 mmHg   PA Diastolic Thresholds: 12-17 mmHg    Patient has not sent CardioMEMS consistently.

## 2021-05-21 ENCOUNTER — Inpatient Hospital Stay: Admit: 2021-05-21 | Discharge: 2021-05-21 | Payer: MEDICARE

## 2021-05-21 MED ADMIN — POTASSIUM CHLORIDE 20 MEQ PO TBTQ [35943]: 40 meq | ORAL | @ 10:00:00 | Stop: 2021-05-21 | NDC 00832532511

## 2021-05-21 MED ADMIN — SENNOSIDES-DOCUSATE SODIUM 8.6-50 MG PO TAB [40926]: 1 | ORAL | @ 13:00:00 | NDC 00536124801

## 2021-05-21 MED ADMIN — PERFLUTREN LIPID MICROSPHERES 1.1 MG/ML IV SUSP [79178]: 2 mL | INTRAVENOUS | @ 13:00:00 | Stop: 2021-05-21 | NDC 11994001116

## 2021-05-21 MED ADMIN — POTASSIUM CHLORIDE IN WATER 10 MEQ/50 ML IV PGBK [11075]: 10 meq | INTRAVENOUS | @ 10:00:00 | Stop: 2021-05-21 | NDC 00338070541

## 2021-05-21 MED ADMIN — HEPARIN (PORCINE) IN 5 % DEX 20,000 UNIT/500 ML (40 UNIT/ML) IV SOLP [3628]: 1695 [IU]/h | INTRAVENOUS | @ 17:00:00 | NDC 00264956710

## 2021-05-21 MED ADMIN — CALCIUM CARBONATE 200 MG CALCIUM (500 MG) PO CHEW [9385]: 500 mg | ORAL | @ 08:00:00 | NDC 66553000401

## 2021-05-22 MED ADMIN — HEPARIN (PORCINE) IN 5 % DEX 20,000 UNIT/500 ML (40 UNIT/ML) IV SOLP [3628]: 1795 [IU]/h | INTRAVENOUS | @ 05:00:00 | Stop: 2021-05-22 | NDC 00264956710

## 2021-05-22 MED ADMIN — BUMETANIDE 0.25 MG/ML IJ SOLN [9308]: 4 mg | INTRAVENOUS | @ 03:00:00 | Stop: 2021-05-22 | NDC 65219057001

## 2021-05-22 MED ADMIN — SENNOSIDES-DOCUSATE SODIUM 8.6-50 MG PO TAB [40926]: 1 | ORAL | @ 03:00:00 | NDC 00536124801

## 2021-05-22 MED ADMIN — POLYETHYLENE GLYCOL 3350 17 GRAM PO PWPK [25424]: 34 g | ORAL | @ 15:00:00 | NDC 00904693186

## 2021-05-22 MED ADMIN — DEXTROSE 5% IN WATER IV SOLP [2364]: 2095 [IU]/h | INTRAVENOUS | @ 23:00:00 | NDC 00338001702

## 2021-05-22 MED ADMIN — MAGNESIUM SULFATE IN WATER 4 GRAM/50 ML (8 %) IV PGBK [166563]: 4 g | INTRAVENOUS | @ 07:00:00 | Stop: 2021-05-22 | NDC 00338171940

## 2021-05-22 MED ADMIN — POTASSIUM CHLORIDE 20 MEQ PO TBTQ [35943]: 20 meq | ORAL | @ 11:00:00 | Stop: 2021-05-22 | NDC 00832532511

## 2021-05-22 MED ADMIN — METOLAZONE 5 MG PO TAB [10588]: 5 mg | ORAL | @ 03:00:00 | Stop: 2021-05-22 | NDC 51079002401

## 2021-05-22 MED ADMIN — HEPARIN (PORCINE) 10,000 UNIT/ML IJ SOLN [10177]: 2095 [IU]/h | INTRAVENOUS | @ 23:00:00 | NDC 63739096411

## 2021-05-22 MED ADMIN — AMIODARONE IN DEXTROSE,ISO-OSM 360 MG/200 ML (1.8 MG/ML) IV SOLN [307603]: 1 mg/min | INTRAVENOUS | @ 19:00:00 | NDC 43066036020

## 2021-05-22 MED ADMIN — HEPARIN (PORCINE) IN 5 % DEX 20,000 UNIT/500 ML (40 UNIT/ML) IV SOLP [3628]: 1995 [IU]/h | INTRAVENOUS | @ 17:00:00 | Stop: 2021-05-22 | NDC 00264956710

## 2021-05-22 MED ADMIN — DEXTROSE 5% IN WATER IV SOLP [2364]: 150 mg | INTRAVENOUS | @ 19:00:00 | Stop: 2021-05-22 | NDC 00338001738

## 2021-05-22 MED ADMIN — POTASSIUM CHLORIDE 20 MEQ PO TBTQ [35943]: 60 meq | ORAL | @ 22:00:00 | Stop: 2021-05-22 | NDC 00832532511

## 2021-05-22 MED ADMIN — AMIODARONE 50 MG/ML IV SOLN [9065]: 150 mg | INTRAVENOUS | @ 19:00:00 | Stop: 2021-05-22 | NDC 00143987501

## 2021-05-22 MED ADMIN — SENNOSIDES-DOCUSATE SODIUM 8.6-50 MG PO TAB [40926]: 1 | ORAL | @ 13:00:00 | NDC 00536124801

## 2021-05-23 ENCOUNTER — Encounter: Admit: 2021-05-23 | Discharge: 2021-05-23 | Payer: MEDICARE

## 2021-05-23 ENCOUNTER — Inpatient Hospital Stay: Admit: 2021-05-23 | Discharge: 2021-05-23 | Payer: MEDICARE

## 2021-05-23 DIAGNOSIS — J45909 Unspecified asthma, uncomplicated: Secondary | ICD-10-CM

## 2021-05-23 DIAGNOSIS — N183 CKD (chronic kidney disease) stage 3, GFR 30-59 ml/min (HCC): Secondary | ICD-10-CM

## 2021-05-23 DIAGNOSIS — Z9981 Dependence on supplemental oxygen: Secondary | ICD-10-CM

## 2021-05-23 DIAGNOSIS — J449 Chronic obstructive pulmonary disease, unspecified: Secondary | ICD-10-CM

## 2021-05-23 DIAGNOSIS — K219 Gastro-esophageal reflux disease without esophagitis: Secondary | ICD-10-CM

## 2021-05-23 DIAGNOSIS — I509 Heart failure, unspecified: Secondary | ICD-10-CM

## 2021-05-23 DIAGNOSIS — M961 Postlaminectomy syndrome, not elsewhere classified: Secondary | ICD-10-CM

## 2021-05-23 DIAGNOSIS — Z8679 Personal history of other diseases of the circulatory system: Secondary | ICD-10-CM

## 2021-05-23 DIAGNOSIS — G4733 Obstructive sleep apnea (adult) (pediatric): Secondary | ICD-10-CM

## 2021-05-23 DIAGNOSIS — M954 Acquired deformity of chest and rib: Secondary | ICD-10-CM

## 2021-05-23 DIAGNOSIS — N401 Enlarged prostate with lower urinary tract symptoms: Secondary | ICD-10-CM

## 2021-05-23 DIAGNOSIS — Z8614 Personal history of Methicillin resistant Staphylococcus aureus infection: Secondary | ICD-10-CM

## 2021-05-23 DIAGNOSIS — J479 Bronchiectasis, uncomplicated: Secondary | ICD-10-CM

## 2021-05-23 DIAGNOSIS — R609 Edema, unspecified: Secondary | ICD-10-CM

## 2021-05-23 DIAGNOSIS — U071 Pneumonia due to COVID-19 virus: Secondary | ICD-10-CM

## 2021-05-23 DIAGNOSIS — L03116 Cellulitis of left lower limb: Secondary | ICD-10-CM

## 2021-05-23 DIAGNOSIS — D509 Iron deficiency anemia, unspecified: Secondary | ICD-10-CM

## 2021-05-23 DIAGNOSIS — I4891 Unspecified atrial fibrillation: Secondary | ICD-10-CM

## 2021-05-23 DIAGNOSIS — E782 Mixed hyperlipidemia: Secondary | ICD-10-CM

## 2021-05-23 DIAGNOSIS — I429 Cardiomyopathy, unspecified: Secondary | ICD-10-CM

## 2021-05-23 DIAGNOSIS — I5042 Chronic combined systolic (congestive) and diastolic (congestive) heart failure: Secondary | ICD-10-CM

## 2021-05-23 DIAGNOSIS — R0600 Dyspnea, unspecified: Secondary | ICD-10-CM

## 2021-05-23 DIAGNOSIS — E119 Type 2 diabetes mellitus without complications: Secondary | ICD-10-CM

## 2021-05-23 DIAGNOSIS — J302 Other seasonal allergic rhinitis: Secondary | ICD-10-CM

## 2021-05-23 DIAGNOSIS — I10 Essential (primary) hypertension: Secondary | ICD-10-CM

## 2021-05-23 DIAGNOSIS — R053 Chronic cough: Secondary | ICD-10-CM

## 2021-05-23 DIAGNOSIS — J4541 Moderate persistent asthma with (acute) exacerbation: Secondary | ICD-10-CM

## 2021-05-23 DIAGNOSIS — Z95828 Presence of other vascular implants and grafts: Secondary | ICD-10-CM

## 2021-05-23 DIAGNOSIS — I2510 Atherosclerotic heart disease of native coronary artery without angina pectoris: Secondary | ICD-10-CM

## 2021-05-23 DIAGNOSIS — I714 AAA (abdominal aortic aneurysm) (HCC): Secondary | ICD-10-CM

## 2021-05-23 DIAGNOSIS — J189 Pneumonia, unspecified organism: Secondary | ICD-10-CM

## 2021-05-23 MED ADMIN — IRON SUCROSE 100 MG IRON/5 ML IV SOLN GROUP [280019]: 300 mg | INTRAVENOUS | @ 22:00:00 | Stop: 2021-05-26 | NDC 00517234001

## 2021-05-23 MED ADMIN — AMIODARONE IN DEXTROSE,ISO-OSM 360 MG/200 ML (1.8 MG/ML) IV SOLN [307603]: 0.5 mg/min | INTRAVENOUS | @ 12:00:00 | NDC 43066036020

## 2021-05-23 MED ADMIN — POLYETHYLENE GLYCOL 3350 17 GRAM PO PWPK [25424]: 34 g | ORAL | @ 21:00:00 | NDC 00904693186

## 2021-05-23 MED ADMIN — POTASSIUM CHLORIDE 20 MEQ PO TBTQ [35943]: 40 meq | ORAL | @ 01:00:00 | Stop: 2021-05-23 | NDC 00832532511

## 2021-05-23 MED ADMIN — HEPARIN (PORCINE) 10,000 UNIT/ML IJ SOLN [10177]: 2295 [IU]/h | INTRAVENOUS | @ 08:00:00 | Stop: 2021-05-23 | NDC 63739096411

## 2021-05-23 MED ADMIN — POLYETHYLENE GLYCOL 3350 17 GRAM PO PWPK [25424]: 34 g | ORAL | @ 14:00:00 | NDC 00904693186

## 2021-05-23 MED ADMIN — MAGNESIUM SULFATE IN WATER 4 GRAM/50 ML (8 %) IV PGBK [166563]: 4 g | INTRAVENOUS | @ 01:00:00 | Stop: 2021-05-24 | NDC 00338171940

## 2021-05-23 MED ADMIN — DEXTROSE 5% IN WATER IV SOLP [2364]: 2295 [IU]/h | INTRAVENOUS | @ 08:00:00 | Stop: 2021-05-23 | NDC 00338001702

## 2021-05-23 MED ADMIN — SENNOSIDES-DOCUSATE SODIUM 8.6-50 MG PO TAB [40926]: 1 | ORAL | @ 01:00:00 | NDC 00536124801

## 2021-05-23 MED ADMIN — RIVAROXABAN 20 MG PO TAB [309661]: 20 mg | ORAL | @ 22:00:00 | Stop: 2021-05-23 | NDC 50458057901

## 2021-05-23 MED ADMIN — POLYETHYLENE GLYCOL 3350 17 GRAM PO PWPK [25424]: 34 g | ORAL | @ 01:00:00 | NDC 00904693186

## 2021-05-23 MED ADMIN — SODIUM CHLORIDE 0.9 % IV SOLP [27838]: 300 mg | INTRAVENOUS | @ 22:00:00 | Stop: 2021-05-26 | NDC 00338004902

## 2021-05-23 MED ADMIN — SENNOSIDES-DOCUSATE SODIUM 8.6-50 MG PO TAB [40926]: 1 | ORAL | @ 14:00:00 | NDC 00536124801

## 2021-05-23 MED ADMIN — AMIODARONE IN DEXTROSE,ISO-OSM 360 MG/200 ML (1.8 MG/ML) IV SOLN [307603]: 0.5 mg/min | INTRAVENOUS | NDC 43066036020

## 2021-05-23 MED ADMIN — DEXTROSE 5% IN WATER IV SOLP [2364]: 2495 [IU]/h | INTRAVENOUS | @ 17:00:00 | Stop: 2021-05-23 | NDC 00338001702

## 2021-05-23 MED ADMIN — HEPARIN (PORCINE) 10,000 UNIT/ML IJ SOLN [10177]: 2495 [IU]/h | INTRAVENOUS | @ 17:00:00 | Stop: 2021-05-23 | NDC 63739096411

## 2021-05-23 MED ADMIN — CYANOCOBALAMIN (VITAMIN B-12) 1,000 MCG/ML IJ SOLN [2007]: 1000 ug | INTRAMUSCULAR | @ 22:00:00 | NDC 70069000501

## 2021-05-24 ENCOUNTER — Encounter: Admit: 2021-05-24 | Discharge: 2021-05-24 | Payer: MEDICARE

## 2021-05-24 MED ADMIN — MAGNESIUM OXIDE 400 MG (241.3 MG MAGNESIUM) PO TAB [10491]: 400 mg | ORAL | @ 16:00:00 | NDC 10006070028

## 2021-05-24 MED ADMIN — SENNOSIDES-DOCUSATE SODIUM 8.6-50 MG PO TAB [40926]: 1 | ORAL | @ 13:00:00 | NDC 00536124801

## 2021-05-24 MED ADMIN — MAGNESIUM SULFATE IN WATER 4 GRAM/50 ML (8 %) IV PGBK [166563]: 4 g | INTRAVENOUS | @ 09:00:00 | Stop: 2021-05-24 | NDC 00338171940

## 2021-05-24 MED ADMIN — POLYETHYLENE GLYCOL 3350 17 GRAM PO PWPK [25424]: 34 g | ORAL | @ 13:00:00 | NDC 00904693186

## 2021-05-24 MED ADMIN — POLYETHYLENE GLYCOL 3350 17 GRAM PO PWPK [25424]: 34 g | ORAL | @ 02:00:00 | NDC 00904693186

## 2021-05-24 MED ADMIN — BISACODYL 10 MG RE SUPP [1080]: 10 mg | RECTAL | @ 13:00:00 | Stop: 2021-05-24 | NDC 00574705012

## 2021-05-24 MED ADMIN — AMIODARONE 200 MG PO TAB [9066]: 400 mg | ORAL | @ 14:00:00 | NDC 00904699361

## 2021-05-24 MED ADMIN — AMIODARONE IN DEXTROSE,ISO-OSM 360 MG/200 ML (1.8 MG/ML) IV SOLN [307603]: 0.5 mg/min | INTRAVENOUS | @ 13:00:00 | Stop: 2021-05-24 | NDC 43066036020

## 2021-05-24 MED ADMIN — SENNOSIDES-DOCUSATE SODIUM 8.6-50 MG PO TAB [40926]: 1 | ORAL | @ 02:00:00 | NDC 00536124801

## 2021-05-24 MED ADMIN — AMIODARONE IN DEXTROSE,ISO-OSM 360 MG/200 ML (1.8 MG/ML) IV SOLN [307603]: 0.5 mg/min | INTRAVENOUS | @ 01:00:00 | NDC 43066036020

## 2021-05-24 MED ADMIN — IRON SUCROSE 100 MG IRON/5 ML IV SOLN GROUP [280019]: 300 mg | INTRAVENOUS | @ 21:00:00 | Stop: 2021-05-26 | NDC 00517234001

## 2021-05-24 MED ADMIN — SODIUM CHLORIDE 0.9 % IV SOLP [27838]: 300 mg | INTRAVENOUS | @ 21:00:00 | Stop: 2021-05-26 | NDC 00338004902

## 2021-05-24 MED ADMIN — POLYETHYLENE GLYCOL 3350 17 GRAM PO PWPK [25424]: 34 g | ORAL | @ 21:00:00 | NDC 00904693186

## 2021-05-24 MED ADMIN — CYANOCOBALAMIN (VITAMIN B-12) 1,000 MCG/ML IJ SOLN [2007]: 1000 ug | INTRAMUSCULAR | @ 13:00:00 | NDC 69680011201

## 2021-05-24 MED ADMIN — BUMETANIDE 0.5 MG PO TAB [9309]: 3 mg | ORAL | @ 13:00:00 | Stop: 2021-05-24 | NDC 69238148901

## 2021-05-25 ENCOUNTER — Encounter: Admit: 2021-05-25 | Discharge: 2021-05-25 | Payer: MEDICARE

## 2021-05-25 MED ADMIN — SENNOSIDES-DOCUSATE SODIUM 8.6-50 MG PO TAB [40926]: 1 | ORAL | @ 01:00:00 | NDC 00536124801

## 2021-05-25 MED ADMIN — POLYETHYLENE GLYCOL 3350 17 GRAM PO PWPK [25424]: 34 g | ORAL | @ 13:00:00 | NDC 00904693186

## 2021-05-25 MED ADMIN — POLYETHYLENE GLYCOL 3350 17 GRAM PO PWPK [25424]: 34 g | ORAL | @ 20:00:00 | NDC 00904693186

## 2021-05-25 MED ADMIN — MAGNESIUM SULFATE IN D5W 1 GRAM/100 ML IV PGBK [166578]: 1 g | INTRAVENOUS | @ 09:00:00 | Stop: 2021-05-25 | NDC 00338170940

## 2021-05-25 MED ADMIN — MAGNESIUM OXIDE 400 MG (241.3 MG MAGNESIUM) PO TAB [10491]: 400 mg | ORAL | @ 13:00:00 | NDC 64980033912

## 2021-05-25 MED ADMIN — ASPIRIN 81 MG PO TBEC [14113]: 81 mg | ORAL | @ 01:00:00 | NDC 00536123441

## 2021-05-25 MED ADMIN — IRON SUCROSE 100 MG IRON/5 ML IV SOLN GROUP [280019]: 300 mg | INTRAVENOUS | @ 22:00:00 | Stop: 2021-05-25 | NDC 00517234001

## 2021-05-25 MED ADMIN — POLYETHYLENE GLYCOL 3350 17 GRAM PO PWPK [25424]: 34 g | ORAL | @ 01:00:00 | NDC 00904693186

## 2021-05-25 MED ADMIN — MAGNESIUM HYDROXIDE 400 MG/5 ML PO SUSP [79944]: 30 mL | ORAL | @ 05:00:00 | Stop: 2021-05-25 | NDC 00121043130

## 2021-05-25 MED ADMIN — CYANOCOBALAMIN (VITAMIN B-12) 1,000 MCG/ML IJ SOLN [2007]: 1000 ug | INTRAMUSCULAR | @ 13:00:00 | NDC 69680011201

## 2021-05-25 MED ADMIN — AMIODARONE 200 MG PO TAB [9066]: 400 mg | ORAL | @ 01:00:00 | NDC 00904699361

## 2021-05-25 MED ADMIN — SENNOSIDES-DOCUSATE SODIUM 8.6-50 MG PO TAB [40926]: 1 | ORAL | @ 13:00:00 | NDC 00536124801

## 2021-05-25 MED ADMIN — ENOXAPARIN 40 MG/0.4 ML SC SYRG [85052]: 40 mg | SUBCUTANEOUS | @ 01:00:00 | NDC 00781324602

## 2021-05-25 MED ADMIN — AMIODARONE 200 MG PO TAB [9066]: 400 mg | ORAL | @ 13:00:00 | NDC 00904699361

## 2021-05-25 MED ADMIN — MAGNESIUM OXIDE 400 MG (241.3 MG MAGNESIUM) PO TAB [10491]: 400 mg | ORAL | @ 01:00:00 | NDC 64980033912

## 2021-05-25 MED ADMIN — DEXTROSE 5% IN WATER IV SOLP [2364]: 1654 [IU]/h | INTRAVENOUS | @ 18:00:00 | NDC 00338001702

## 2021-05-25 MED ADMIN — MAGNESIUM SULFATE IN D5W 1 GRAM/100 ML IV PGBK [166578]: 1 g | INTRAVENOUS | @ 07:00:00 | Stop: 2021-05-25 | NDC 00338170940

## 2021-05-25 MED ADMIN — SODIUM CHLORIDE 0.9 % IV SOLP [27838]: 300 mg | INTRAVENOUS | @ 22:00:00 | Stop: 2021-05-25 | NDC 00338004902

## 2021-05-25 MED ADMIN — HEPARIN (PORCINE) 10,000 UNIT/ML IJ SOLN [10177]: 1654 [IU]/h | INTRAVENOUS | @ 18:00:00 | NDC 63739096411

## 2021-05-26 ENCOUNTER — Encounter: Admit: 2021-05-26 | Discharge: 2021-05-26 | Payer: MEDICARE

## 2021-05-26 MED ADMIN — DEXTROSE 5% IN WATER IV SOLP [2364]: 1754 [IU]/h | INTRAVENOUS | NDC 00338001702

## 2021-05-26 MED ADMIN — SENNOSIDES-DOCUSATE SODIUM 8.6-50 MG PO TAB [40926]: 1 | ORAL | @ 13:00:00 | NDC 00536124801

## 2021-05-26 MED ADMIN — POLYETHYLENE GLYCOL 3350 17 GRAM PO PWPK [25424]: 34 g | ORAL | @ 13:00:00 | NDC 00904693186

## 2021-05-26 MED ADMIN — AMIODARONE 200 MG PO TAB [9066]: 400 mg | ORAL | @ 01:00:00 | NDC 00904699361

## 2021-05-26 MED ADMIN — ASPIRIN 81 MG PO TBEC [14113]: 81 mg | ORAL | @ 01:00:00 | NDC 00536123441

## 2021-05-26 MED ADMIN — SODIUM CHLORIDE 0.9 % IV SOLP [27838]: 1954 [IU]/h | INTRAVENOUS | @ 19:00:00 | NDC 00338004902

## 2021-05-26 MED ADMIN — DEXTROSE 5% IN WATER IV SOLP [2364]: 1854 [IU]/h | INTRAVENOUS | @ 08:00:00 | Stop: 2021-05-26 | NDC 00338001702

## 2021-05-26 MED ADMIN — AMIODARONE 200 MG PO TAB [9066]: 400 mg | ORAL | @ 13:00:00 | NDC 00904699361

## 2021-05-26 MED ADMIN — POLYETHYLENE GLYCOL 3350 17 GRAM PO PWPK [25424]: 34 g | ORAL | @ 02:00:00 | NDC 00904693186

## 2021-05-26 MED ADMIN — CYANOCOBALAMIN (VITAMIN B-12) 1,000 MCG/ML IJ SOLN [2007]: 1000 ug | INTRAMUSCULAR | @ 13:00:00 | NDC 69680011201

## 2021-05-26 MED ADMIN — SENNOSIDES-DOCUSATE SODIUM 8.6-50 MG PO TAB [40926]: 1 | ORAL | @ 02:00:00 | NDC 00536124801

## 2021-05-26 MED ADMIN — MAGNESIUM OXIDE 400 MG (241.3 MG MAGNESIUM) PO TAB [10491]: 400 mg | ORAL | @ 01:00:00 | NDC 64980033912

## 2021-05-26 MED ADMIN — HEPARIN (PORCINE) 10,000 UNIT/ML IJ SOLN [10177]: 1954 [IU]/h | INTRAVENOUS | @ 19:00:00 | NDC 63739096411

## 2021-05-26 MED ADMIN — HEPARIN (PORCINE) 10,000 UNIT/ML IJ SOLN [10177]: 1754 [IU]/h | INTRAVENOUS | NDC 63739096411

## 2021-05-26 MED ADMIN — HEPARIN (PORCINE) 10,000 UNIT/ML IJ SOLN [10177]: 1854 [IU]/h | INTRAVENOUS | @ 08:00:00 | Stop: 2021-05-26 | NDC 63739096411

## 2021-05-26 MED ADMIN — POLYETHYLENE GLYCOL 3350 17 GRAM PO PWPK [25424]: 34 g | ORAL | @ 21:00:00 | NDC 00904693186

## 2021-05-26 MED ADMIN — MAGNESIUM OXIDE 400 MG (241.3 MG MAGNESIUM) PO TAB [10491]: 400 mg | ORAL | @ 13:00:00 | NDC 64980033912

## 2021-05-27 ENCOUNTER — Encounter: Admit: 2021-05-27 | Discharge: 2021-05-27 | Payer: MEDICARE

## 2021-05-27 ENCOUNTER — Inpatient Hospital Stay: Admit: 2021-05-27 | Discharge: 2021-05-27 | Payer: MEDICARE

## 2021-05-27 MED ADMIN — SODIUM CHLORIDE 0.9 % IV SOLP [27838]: 2054 [IU]/h | INTRAVENOUS | @ 03:00:00 | NDC 00338004902

## 2021-05-27 MED ADMIN — SENNOSIDES-DOCUSATE SODIUM 8.6-50 MG PO TAB [40926]: 1 | ORAL | @ 13:00:00 | NDC 00536124801

## 2021-05-27 MED ADMIN — ASPIRIN 81 MG PO TBEC [14113]: 81 mg | ORAL | @ 01:00:00 | NDC 00536123441

## 2021-05-27 MED ADMIN — HEPARIN (PORCINE) 10,000 UNIT/ML IJ SOLN [10177]: 2154 [IU]/h | INTRAVENOUS | @ 13:00:00 | Stop: 2021-05-28 | NDC 63739096411

## 2021-05-27 MED ADMIN — SODIUM CHLORIDE 0.9 % IV SOLP [27838]: 2154 [IU]/h | INTRAVENOUS | @ 13:00:00 | Stop: 2021-05-28 | NDC 00338004902

## 2021-05-27 MED ADMIN — ASPIRIN 325 MG PO TAB [681]: 325 mg | ORAL | @ 19:00:00 | Stop: 2021-05-27 | NDC 66553000101

## 2021-05-27 MED ADMIN — AMIODARONE 200 MG PO TAB [9066]: 400 mg | ORAL | @ 13:00:00 | NDC 00904699361

## 2021-05-27 MED ADMIN — CYANOCOBALAMIN (VITAMIN B-12) 1,000 MCG/ML IJ SOLN [2007]: 1000 ug | INTRAMUSCULAR | @ 13:00:00 | NDC 69680011201

## 2021-05-27 MED ADMIN — MAGNESIUM OXIDE 400 MG (241.3 MG MAGNESIUM) PO TAB [10491]: 400 mg | ORAL | @ 01:00:00 | NDC 64980033912

## 2021-05-27 MED ADMIN — AMIODARONE 200 MG PO TAB [9066]: 400 mg | ORAL | @ 01:00:00 | NDC 00904699361

## 2021-05-27 MED ADMIN — HEPARIN (PORCINE) 10,000 UNIT/ML IJ SOLN [10177]: 2054 [IU]/h | INTRAVENOUS | @ 03:00:00 | NDC 63739096411

## 2021-05-27 MED ADMIN — CLOPIDOGREL 300 MG PO TAB [166651]: 600 mg | ORAL | @ 02:00:00 | Stop: 2021-05-27 | NDC 00904646707

## 2021-05-27 MED ADMIN — MAGNESIUM OXIDE 400 MG (241.3 MG MAGNESIUM) PO TAB [10491]: 400 mg | ORAL | @ 13:00:00 | NDC 64980033912

## 2021-05-28 MED ADMIN — CALCIUM CARBONATE 200 MG CALCIUM (500 MG) PO CHEW [9385]: 500 mg | ORAL | @ 01:00:00 | NDC 66553000401

## 2021-05-28 MED ADMIN — MAGNESIUM OXIDE 400 MG (241.3 MG MAGNESIUM) PO TAB [10491]: 400 mg | ORAL | @ 14:00:00 | NDC 64980033912

## 2021-05-28 MED ADMIN — AMIODARONE 200 MG PO TAB [9066]: 400 mg | ORAL | @ 14:00:00 | NDC 00904699361

## 2021-05-28 MED ADMIN — BUMETANIDE 0.5 MG PO TAB [9309]: 3 mg | ORAL | @ 16:00:00 | NDC 69238148901

## 2021-05-28 MED ADMIN — FERROUS GLUCONATE 324 MG (37.5 MG IRON) PO TAB [308378]: 324 mg | ORAL | @ 14:00:00 | NDC 79854050032

## 2021-05-28 MED ADMIN — CYANOCOBALAMIN (VITAMIN B-12) 1,000 MCG/ML IJ SOLN [2007]: 1000 ug | INTRAMUSCULAR | @ 14:00:00 | NDC 69680011201

## 2021-05-28 MED ADMIN — ASPIRIN 81 MG PO CHEW [680]: 81 mg | ORAL | @ 14:00:00 | NDC 66553000201

## 2021-05-28 MED ADMIN — CLOPIDOGREL 75 MG PO TAB [78966]: 75 mg | ORAL | @ 14:00:00 | NDC 00904629461

## 2021-05-28 MED ADMIN — MAGNESIUM OXIDE 400 MG (241.3 MG MAGNESIUM) PO TAB [10491]: 400 mg | ORAL | @ 02:00:00 | NDC 64980033912

## 2021-05-28 MED ADMIN — MAGNESIUM SULFATE IN D5W 1 GRAM/100 ML IV PGBK [166578]: 1 g | INTRAVENOUS | @ 11:00:00 | Stop: 2021-05-28 | NDC 00338170940

## 2021-05-28 MED ADMIN — AMIODARONE 200 MG PO TAB [9066]: 400 mg | ORAL | @ 02:00:00 | NDC 00904699361

## 2021-05-29 ENCOUNTER — Encounter: Admit: 2021-05-29 | Discharge: 2021-05-29 | Payer: MEDICARE

## 2021-05-29 MED ADMIN — SODIUM CHLORIDE 0.9 % IV SOLP [27838]: 800 [IU]/h | INTRAVENOUS | NDC 00338004902

## 2021-05-29 MED ADMIN — BUMETANIDE 0.5 MG PO TAB [9309]: 3 mg | ORAL | @ 14:00:00 | NDC 69238148901

## 2021-05-29 MED ADMIN — MAGNESIUM OXIDE 400 MG (241.3 MG MAGNESIUM) PO TAB [10491]: 400 mg | ORAL | @ 14:00:00 | NDC 64980033912

## 2021-05-29 MED ADMIN — HEPARIN (PORCINE) 10,000 UNIT/ML IJ SOLN [10177]: 1200 [IU]/h | INTRAVENOUS | @ 23:00:00 | NDC 63739096411

## 2021-05-29 MED ADMIN — MAGNESIUM OXIDE 400 MG (241.3 MG MAGNESIUM) PO TAB [10491]: 400 mg | ORAL | @ 02:00:00 | NDC 64980033912

## 2021-05-29 MED ADMIN — AMIODARONE 200 MG PO TAB [9066]: 400 mg | ORAL | @ 14:00:00 | NDC 00904699361

## 2021-05-29 MED ADMIN — SODIUM CHLORIDE 0.9 % IV SOLP [27838]: 1200 [IU]/h | INTRAVENOUS | @ 23:00:00 | NDC 00338004902

## 2021-05-29 MED ADMIN — ASPIRIN 81 MG PO CHEW [680]: 81 mg | ORAL | @ 14:00:00 | NDC 66553000201

## 2021-05-29 MED ADMIN — MAGNESIUM SULFATE IN D5W 1 GRAM/100 ML IV PGBK [166578]: 1 g | INTRAVENOUS | @ 10:00:00 | Stop: 2021-05-29 | NDC 00338170940

## 2021-05-29 MED ADMIN — CYANOCOBALAMIN (VITAMIN B-12) 1,000 MCG/ML IJ SOLN [2007]: 1000 ug | INTRAMUSCULAR | @ 14:00:00 | NDC 69680011201

## 2021-05-29 MED ADMIN — CLOPIDOGREL 75 MG PO TAB [78966]: 75 mg | ORAL | @ 14:00:00 | NDC 00904629461

## 2021-05-29 MED ADMIN — MAGNESIUM SULFATE IN D5W 1 GRAM/100 ML IV PGBK [166578]: 1 g | INTRAVENOUS | @ 12:00:00 | Stop: 2021-05-29 | NDC 00338170940

## 2021-05-29 MED ADMIN — HEPARIN (PORCINE) 10,000 UNIT/ML IJ SOLN [10177]: 800 [IU]/h | INTRAVENOUS | NDC 63739096411

## 2021-05-29 MED ADMIN — AMIODARONE 200 MG PO TAB [9066]: 400 mg | ORAL | @ 02:00:00 | NDC 00904699361

## 2021-05-30 ENCOUNTER — Encounter: Admit: 2021-05-30 | Discharge: 2021-05-30 | Payer: MEDICARE

## 2021-05-30 MED ADMIN — AMIODARONE 200 MG PO TAB [9066]: 400 mg | ORAL | @ 14:00:00 | NDC 00904699361

## 2021-05-30 MED ADMIN — SODIUM CHLORIDE 0.9 % IV SOLP [27838]: 1500 [IU]/h | INTRAVENOUS | @ 16:00:00 | Stop: 2021-05-30 | NDC 00338004902

## 2021-05-30 MED ADMIN — MAGNESIUM OXIDE 400 MG (241.3 MG MAGNESIUM) PO TAB [10491]: 400 mg | ORAL | @ 02:00:00 | NDC 64980033912

## 2021-05-30 MED ADMIN — CYANOCOBALAMIN (VITAMIN B-12) 1,000 MCG/ML IJ SOLN [2007]: 1000 ug | INTRAMUSCULAR | @ 14:00:00 | NDC 69680011201

## 2021-05-30 MED ADMIN — ASPIRIN 81 MG PO CHEW [680]: 81 mg | ORAL | @ 14:00:00 | NDC 66553000201

## 2021-05-30 MED ADMIN — HEPARIN (PORCINE) 10,000 UNIT/ML IJ SOLN [10177]: 1500 [IU]/h | INTRAVENOUS | @ 16:00:00 | Stop: 2021-05-30 | NDC 63323054201

## 2021-05-30 MED ADMIN — FERROUS GLUCONATE 324 MG (37.5 MG IRON) PO TAB [308378]: 324 mg | ORAL | @ 14:00:00 | NDC 79854050032

## 2021-05-30 MED ADMIN — RIVAROXABAN 20 MG PO TAB [309661]: 20 mg | ORAL | @ 22:00:00 | NDC 50458057901

## 2021-05-30 MED ADMIN — BUMETANIDE 0.5 MG PO TAB [9309]: 3 mg | ORAL | @ 14:00:00 | NDC 69238148901

## 2021-05-30 MED ADMIN — AMIODARONE 200 MG PO TAB [9066]: 400 mg | ORAL | @ 02:00:00 | NDC 00904699361

## 2021-05-30 MED ADMIN — CLOPIDOGREL 75 MG PO TAB [78966]: 75 mg | ORAL | @ 14:00:00 | NDC 00904629461

## 2021-05-30 MED ADMIN — MAGNESIUM SULFATE IN D5W 1 GRAM/100 ML IV PGBK [166578]: 1 g | INTRAVENOUS | @ 17:00:00 | Stop: 2021-05-30 | NDC 00338170940

## 2021-05-30 MED ADMIN — MAGNESIUM SULFATE IN D5W 1 GRAM/100 ML IV PGBK [166578]: 1 g | INTRAVENOUS | @ 14:00:00 | Stop: 2021-05-30 | NDC 00338170940

## 2021-05-30 MED ADMIN — MAGNESIUM OXIDE 400 MG (241.3 MG MAGNESIUM) PO TAB [10491]: 400 mg | ORAL | @ 14:00:00 | NDC 64980033912

## 2021-05-31 ENCOUNTER — Inpatient Hospital Stay: Admit: 2021-05-31 | Discharge: 2021-05-31 | Payer: MEDICARE

## 2021-05-31 MED ADMIN — ASPIRIN 81 MG PO CHEW [680]: 81 mg | ORAL | @ 14:00:00 | Stop: 2021-06-04 | NDC 66553000201

## 2021-05-31 MED ADMIN — MAGNESIUM OXIDE 400 MG (241.3 MG MAGNESIUM) PO TAB [10491]: 400 mg | ORAL | @ 14:00:00 | NDC 64980033912

## 2021-05-31 MED ADMIN — PIPERACILLIN-TAZOBACTAM 4.5 GRAM IV SOLR [80419]: 4.5 g | INTRAVENOUS | @ 14:00:00 | Stop: 2021-05-31 | NDC 60505615900

## 2021-05-31 MED ADMIN — MAGNESIUM SULFATE IN D5W 1 GRAM/100 ML IV PGBK [166578]: 1 g | INTRAVENOUS | @ 10:00:00 | Stop: 2021-05-31 | NDC 00338170940

## 2021-05-31 MED ADMIN — SODIUM CHLORIDE 0.9 % IV PGBK (MB+) [95161]: 4.5 g | INTRAVENOUS | @ 14:00:00 | Stop: 2021-05-31 | NDC 00338915930

## 2021-05-31 MED ADMIN — PIPERACILLIN-TAZOBACTAM 4.5 GRAM IV SOLR [80419]: 4.5 g | INTRAVENOUS | @ 19:00:00 | NDC 60505615900

## 2021-05-31 MED ADMIN — MAGNESIUM OXIDE 400 MG (241.3 MG MAGNESIUM) PO TAB [10491]: 400 mg | ORAL | @ 01:00:00 | NDC 64980033912

## 2021-05-31 MED ADMIN — MAGNESIUM SULFATE IN D5W 1 GRAM/100 ML IV PGBK [166578]: 1 g | INTRAVENOUS | @ 09:00:00 | Stop: 2021-05-31 | NDC 00338170940

## 2021-05-31 MED ADMIN — AMIODARONE 200 MG PO TAB [9066]: 400 mg | ORAL | @ 01:00:00 | NDC 00904699361

## 2021-05-31 MED ADMIN — CYANOCOBALAMIN (VITAMIN B-12) 1,000 MCG/ML IJ SOLN [2007]: 1000 ug | INTRAMUSCULAR | @ 14:00:00 | NDC 69680011201

## 2021-05-31 MED ADMIN — SODIUM CHLORIDE 0.9 % IV SOLP [27838]: 250 mL | INTRAVENOUS | @ 14:00:00 | Stop: 2021-05-31 | NDC 00338004902

## 2021-05-31 MED ADMIN — BUMETANIDE 0.5 MG PO TAB [9309]: 3 mg | ORAL | @ 14:00:00 | NDC 69238148901

## 2021-05-31 MED ADMIN — SODIUM CHLORIDE 0.9 % IV PGBK (MB+) [95161]: 4.5 g | INTRAVENOUS | @ 19:00:00 | NDC 00338915930

## 2021-05-31 MED ADMIN — AMIODARONE 200 MG PO TAB [9066]: 400 mg | ORAL | @ 14:00:00 | Stop: 2021-05-31 | NDC 00904699361

## 2021-05-31 MED ADMIN — RIVAROXABAN 20 MG PO TAB [309661]: 20 mg | ORAL | @ 22:00:00 | NDC 50458057901

## 2021-05-31 MED ADMIN — CLOPIDOGREL 75 MG PO TAB [78966]: 75 mg | ORAL | @ 14:00:00 | NDC 00904629461

## 2021-06-01 MED ADMIN — AMIODARONE 200 MG PO TAB [9066]: 400 mg | ORAL | @ 13:00:00 | NDC 00904699361

## 2021-06-01 MED ADMIN — MAGNESIUM OXIDE 400 MG (241.3 MG MAGNESIUM) PO TAB [10491]: 400 mg | ORAL | @ 13:00:00 | NDC 64980033912

## 2021-06-01 MED ADMIN — SODIUM CHLORIDE 0.9 % IV PGBK (MB+) [95161]: 4.5 g | INTRAVENOUS | @ 13:00:00 | Stop: 2021-06-01 | NDC 00338915930

## 2021-06-01 MED ADMIN — MAGNESIUM SULFATE IN WATER 4 GRAM/50 ML (8 %) IV PGBK [166563]: 4 g | INTRAVENOUS | @ 10:00:00 | Stop: 2021-06-01 | NDC 63323010701

## 2021-06-01 MED ADMIN — ASPIRIN 81 MG PO CHEW [680]: 81 mg | ORAL | @ 13:00:00 | Stop: 2021-06-04 | NDC 66553000201

## 2021-06-01 MED ADMIN — PIPERACILLIN-TAZOBACTAM 4.5 GRAM IV SOLR [80419]: 4.5 g | INTRAVENOUS | @ 02:00:00 | NDC 60505615900

## 2021-06-01 MED ADMIN — SODIUM CHLORIDE 0.9 % IV PGBK (MB+) [95161]: 4.5 g | INTRAVENOUS | @ 08:00:00 | Stop: 2021-06-01 | NDC 00338915930

## 2021-06-01 MED ADMIN — ALBUTEROL SULFATE 2.5 MG /3 ML (0.083 %) IN NEBU [250]: 2.5 mg | RESPIRATORY_TRACT | @ 21:00:00 | NDC 60687039579

## 2021-06-01 MED ADMIN — RIVAROXABAN 20 MG PO TAB [309661]: 20 mg | ORAL | @ 21:00:00 | NDC 50458057901

## 2021-06-01 MED ADMIN — FERROUS GLUCONATE 324 MG (37.5 MG IRON) PO TAB [308378]: 324 mg | ORAL | @ 13:00:00 | NDC 79854050032

## 2021-06-01 MED ADMIN — EUCALYPTUS-MENTHOL MM LOZG [83613]: 1 | ORAL | @ 15:00:00 | NDC 12546062970

## 2021-06-01 MED ADMIN — GUAIFENESIN 100 MG/5 ML PO LIQD [79220]: 200 mg | ORAL | @ 15:00:00 | NDC 00121148810

## 2021-06-01 MED ADMIN — BENZONATATE 100 MG PO CAP [988]: 100 mg | ORAL | @ 15:00:00 | NDC 42806071401

## 2021-06-01 MED ADMIN — ALBUTEROL SULFATE 2.5 MG /3 ML (0.083 %) IN NEBU [250]: 2.5 mg | RESPIRATORY_TRACT | @ 17:00:00 | NDC 60687039579

## 2021-06-01 MED ADMIN — SODIUM CHLORIDE 0.9 % IV PGBK (MB+) [95161]: 4.5 g | INTRAVENOUS | @ 02:00:00 | NDC 00338915930

## 2021-06-01 MED ADMIN — PIPERACILLIN-TAZOBACTAM 4.5 GRAM IV SOLR [80419]: 4.5 g | INTRAVENOUS | @ 08:00:00 | Stop: 2021-06-01 | NDC 60505615900

## 2021-06-01 MED ADMIN — CYANOCOBALAMIN (VITAMIN B-12) 1,000 MCG/ML IJ SOLN [2007]: 1000 ug | INTRAMUSCULAR | @ 13:00:00 | NDC 69680011201

## 2021-06-01 MED ADMIN — MAGNESIUM OXIDE 400 MG (241.3 MG MAGNESIUM) PO TAB [10491]: 400 mg | ORAL | @ 02:00:00 | NDC 64980033912

## 2021-06-01 MED ADMIN — BUMETANIDE 0.5 MG PO TAB [9309]: 3 mg | ORAL | @ 13:00:00 | Stop: 2021-06-01 | NDC 69238148901

## 2021-06-01 MED ADMIN — BENZONATATE 100 MG PO CAP [988]: 100 mg | ORAL | @ 20:00:00 | NDC 42806071401

## 2021-06-01 MED ADMIN — AMOXICILLIN-POT CLAVULANATE 875-125 MG PO TAB [33228]: 875 mg | ORAL | @ 16:00:00 | NDC 00093227534

## 2021-06-01 MED ADMIN — PIPERACILLIN-TAZOBACTAM 4.5 GRAM IV SOLR [80419]: 4.5 g | INTRAVENOUS | @ 13:00:00 | Stop: 2021-06-01 | NDC 60505615900

## 2021-06-01 MED ADMIN — GUAIFENESIN 100 MG/5 ML PO LIQD [79220]: 200 mg | ORAL | @ 20:00:00 | NDC 00121148810

## 2021-06-01 MED ADMIN — CLOPIDOGREL 75 MG PO TAB [78966]: 75 mg | ORAL | @ 13:00:00 | NDC 00904629461

## 2021-06-01 MED ADMIN — AMOXICILLIN-POT CLAVULANATE 875-125 MG PO TAB [33228]: 875 mg | ORAL | @ 21:00:00 | NDC 00093227534

## 2021-06-02 MED ADMIN — BENZONATATE 100 MG PO CAP [988]: 100 mg | ORAL | @ 22:00:00 | NDC 42806071401

## 2021-06-02 MED ADMIN — ASPIRIN 81 MG PO CHEW [680]: 81 mg | ORAL | @ 14:00:00 | Stop: 2021-06-02 | NDC 66553000201

## 2021-06-02 MED ADMIN — ALBUTEROL SULFATE 2.5 MG /3 ML (0.083 %) IN NEBU [250]: 2.5 mg | RESPIRATORY_TRACT | @ 01:00:00 | NDC 60687039579

## 2021-06-02 MED ADMIN — MAGNESIUM OXIDE 400 MG (241.3 MG MAGNESIUM) PO TAB [10491]: 400 mg | ORAL | @ 03:00:00 | NDC 64980033912

## 2021-06-02 MED ADMIN — BENZONATATE 100 MG PO CAP [988]: 100 mg | ORAL | @ 05:00:00 | NDC 42806071401

## 2021-06-02 MED ADMIN — MAGNESIUM OXIDE 400 MG (241.3 MG MAGNESIUM) PO TAB [10491]: 400 mg | ORAL | @ 14:00:00 | NDC 64980033912

## 2021-06-02 MED ADMIN — AMOXICILLIN-POT CLAVULANATE 875-125 MG PO TAB [33228]: 875 mg | ORAL | @ 14:00:00 | NDC 00093227534

## 2021-06-02 MED ADMIN — BENZONATATE 100 MG PO CAP [988]: 100 mg | ORAL | @ 14:00:00 | NDC 42806071401

## 2021-06-02 MED ADMIN — ALBUTEROL SULFATE 2.5 MG /3 ML (0.083 %) IN NEBU [250]: 2.5 mg | RESPIRATORY_TRACT | NDC 60687039579

## 2021-06-02 MED ADMIN — POTASSIUM CHLORIDE 20 MEQ PO TBTQ [35943]: 40 meq | ORAL | @ 12:00:00 | Stop: 2021-06-02 | NDC 00832532511

## 2021-06-02 MED ADMIN — BUMETANIDE 0.5 MG PO TAB [9309]: 2 mg | ORAL | @ 14:00:00 | NDC 69238148901

## 2021-06-02 MED ADMIN — SPIRONOLACTONE 25 MG PO TAB [7437]: 25 mg | ORAL | @ 16:00:00 | NDC 00904692761

## 2021-06-02 MED ADMIN — METOPROLOL SUCCINATE 25 MG PO TB24 [81866]: 12.5 mg | ORAL | @ 16:00:00 | NDC 00904632261

## 2021-06-02 MED ADMIN — AMOXICILLIN-POT CLAVULANATE 875-125 MG PO TAB [33228]: 875 mg | ORAL | @ 22:00:00 | NDC 00093227534

## 2021-06-02 MED ADMIN — GUAIFENESIN 100 MG/5 ML PO LIQD [79220]: 200 mg | ORAL | @ 23:00:00 | NDC 00121148810

## 2021-06-02 MED ADMIN — CYANOCOBALAMIN (VITAMIN B-12) 1,000 MCG/ML IJ SOLN [2007]: 1000 ug | INTRAMUSCULAR | @ 14:00:00 | NDC 69680011201

## 2021-06-02 MED ADMIN — CLOPIDOGREL 75 MG PO TAB [78966]: 75 mg | ORAL | @ 14:00:00 | NDC 00904629461

## 2021-06-02 MED ADMIN — AMIODARONE 200 MG PO TAB [9066]: 400 mg | ORAL | @ 14:00:00 | NDC 00904699361

## 2021-06-02 MED ADMIN — GUAIFENESIN 100 MG/5 ML PO LIQD [79220]: 200 mg | ORAL | @ 16:00:00 | NDC 00121148810

## 2021-06-02 MED ADMIN — RIVAROXABAN 20 MG PO TAB [309661]: 20 mg | ORAL | @ 22:00:00 | NDC 50458057901

## 2021-06-03 ENCOUNTER — Inpatient Hospital Stay: Admit: 2021-06-03 | Discharge: 2021-06-03 | Payer: MEDICARE

## 2021-06-03 ENCOUNTER — Encounter: Admit: 2021-06-03 | Discharge: 2021-06-03 | Payer: MEDICARE

## 2021-06-03 MED ADMIN — ALBUTEROL SULFATE 2.5 MG /3 ML (0.083 %) IN NEBU [250]: 2.5 mg | RESPIRATORY_TRACT | @ 12:00:00 | Stop: 2021-06-03 | NDC 60687039579

## 2021-06-03 MED ADMIN — FERROUS GLUCONATE 324 MG (37.5 MG IRON) PO TAB [308378]: 324 mg | ORAL | @ 14:00:00 | Stop: 2021-06-03 | NDC 79854050032

## 2021-06-03 MED ADMIN — ONDANSETRON HCL (PF) 4 MG/2 ML IJ SOLN [136012]: 4 mg | INTRAVENOUS | @ 03:00:00 | NDC 72266012301

## 2021-06-03 MED ADMIN — CYANOCOBALAMIN (VITAMIN B-12) 1,000 MCG/ML IJ SOLN [2007]: 1000 ug | INTRAMUSCULAR | @ 14:00:00 | Stop: 2021-06-03 | NDC 69680011201

## 2021-06-03 MED ADMIN — MAGNESIUM SULFATE IN D5W 1 GRAM/100 ML IV PGBK [166578]: 1 g | INTRAVENOUS | @ 13:00:00 | Stop: 2021-06-03 | NDC 00338170940

## 2021-06-03 MED ADMIN — MAGNESIUM OXIDE 400 MG (241.3 MG MAGNESIUM) PO TAB [10491]: 400 mg | ORAL | @ 02:00:00 | NDC 64980033912

## 2021-06-03 MED ADMIN — ALBUTEROL SULFATE 2.5 MG /3 ML (0.083 %) IN NEBU [250]: 2.5 mg | RESPIRATORY_TRACT | @ 14:00:00 | Stop: 2021-06-03 | NDC 60687039579

## 2021-06-03 MED ADMIN — AMOXICILLIN-POT CLAVULANATE 875-125 MG PO TAB [33228]: 875 mg | ORAL | @ 14:00:00 | Stop: 2021-06-03 | NDC 00093227534

## 2021-06-03 MED ADMIN — BENZONATATE 100 MG PO CAP [988]: 100 mg | ORAL | @ 13:00:00 | Stop: 2021-06-03 | NDC 42806071401

## 2021-06-03 MED ADMIN — LACTATED RINGERS IV SOLP [4318]: 500 mL | INTRAVENOUS | @ 13:00:00 | Stop: 2021-06-03 | NDC 00338011704

## 2021-06-03 MED ADMIN — CLOPIDOGREL 75 MG PO TAB [78966]: 75 mg | ORAL | @ 13:00:00 | Stop: 2021-06-03 | NDC 00904629461

## 2021-06-03 MED ADMIN — AMIODARONE 200 MG PO TAB [9066]: 400 mg | ORAL | @ 13:00:00 | Stop: 2021-06-03 | NDC 00904699361

## 2021-06-03 MED ADMIN — MAGNESIUM OXIDE 400 MG (241.3 MG MAGNESIUM) PO TAB [10491]: 400 mg | ORAL | @ 13:00:00 | Stop: 2021-06-03 | NDC 64980033912

## 2021-06-03 MED ADMIN — MAGNESIUM SULFATE IN D5W 1 GRAM/100 ML IV PGBK [166578]: 1 g | INTRAVENOUS | @ 14:00:00 | Stop: 2021-06-03 | NDC 00338170940

## 2021-06-03 NOTE — Telephone Encounter
Hospital Discharge Follow Up      Reached Patient:No, transferred to Skilled Nursing Facility, [facility] Swing Bed  Patient Date of Birth: 1940-04-02     Admission Information:     Hospital Name: [facility]  Admission Date: 05/20/2021  Discharge Date: 06/03/2021  Admission Diagnosis: Ventricular fibrillation   Discharge Diagnosis:   Acute on chronic combined systolic and diastolic congestive heart  failure, NYHA class 2 (HCC)  Atrial fibrillation (HCC)  CAD (coronary artery disease), native coronary artery  COPD (chronic obstructive pulmonary disease) (HCC)  ICD (implantable cardioverter-defibrillator), biventricular, in situ  ICD (implantable cardioverter-defibrillator) discharge  Iron deficiency anemia  Ischemic cardiomyopathy  Moderate mitral regurgitation  Moderate persistent asthma with acute exacerbation  OSA on CPAP  S/P left pulmonary artery pressure sensor implant placement  Stage 3 chronic kidney disease (HCC)  Type 2 diabetes mellitus with hyperglycemia, without long-term current use of insulin (HCC)  Ventricular fibrillation (HCC)  Acute on chronic systolic heart failure (HCC) - Primary  Bronchiectasis with acute exacerbation (HCC)  Severe persistent asthma, unspecified whether complicated  Has there been a discharge within the last 30 days? No  If yes, reason: N/A  Hospital Services: Planned  Today's call is 0(business) days post discharge    Medication Reconciliation    Changes to pre-hospital medications? Yes   START taking:  amiodarone (PACERONE)  amoxicillin-potassium clavulanate (AUGMENTIN)  clopiDOGreL (PLAVIX)  ferrous gluconate  guaiFENesin (ROBITUSSIN)  CHANGE how you take:  bumetanide (BUMEX)  Were new prescriptions filled?N/A  Meds reviewed and reconciled?Yes  ? acetaminophen (TYLENOL) 325 mg tablet Take two tablets by mouth every 6 hours as needed for Pain.   ? albuterol 0.083% (PROVENTIL) 2.5 mg /3 mL (0.083 %) nebulizer solution Inhale 3 mL solution by nebulizer as directed every 6 hours as needed for Wheezing or Shortness of Breath. Indications: asthma   ? albuterol sulfate (PROAIR HFA) 90 mcg/actuation HFA aerosol inhaler Inhale two puffs by mouth into the lungs every 4 hours as needed for Wheezing or Shortness of Breath.   ? allopurinoL (ZYLOPRIM) 300 mg tablet Take one tablet by mouth daily. take with food   ? amiodarone (PACERONE) 400 mg tablet Take one tablet by mouth daily. Take with food.   ? amitr-gabapen-emu oil 05-24-08% topical cream (COMPOUND) Apply to affected areas twice daily as needed.   ? amoxicillin-potassium clavulanate (AUGMENTIN) 875/125 mg tablet Take one tablet by mouth twice daily with meals. Take with food. Last day of doses is 06/06/21.  Indications: a lower respiratory infection   ? arformoteroL (BROVANA) 15 mcg/2 mL nebulizer solution Inhale 2 mL solution by nebulizer as directed twice daily. Dx: J44.9   ? ascorbic acid-ascorbate sodium 500 mg chew Chew one tablet by mouth every morning.   ? atorvastatin (LIPITOR) 40 mg tablet Take 1 tablet by mouth once daily   ? azelastine (ASTELIN) 137 mcg (0.1 %) nasal spray Apply two sprays to each nostril as directed twice daily. Use in each nostril as directed   ? benzonatate (TESSALON PERLES) 100 mg capsule Take one capsule by mouth every 8 hours.   ? budesonide (PULMICORT) 0.25 mg/2 mL nebulizer solution Inhale 2 mL solution by nebulizer as directed twice daily. Dx: J44.9   ? [START ON 06/05/2021] bumetanide (BUMEX) 2 mg tablet Take one tablet by mouth daily.   ? cholecalciferol (VITAMIN D-3) 1,000 units tablet Take one tablet by mouth daily.   ? clopiDOGreL (PLAVIX) 75 mg tablet Take one tablet by mouth daily.   ?  dulaglutide (TRULICITY) 0.75 mg/0.5 mL injection pen Inject 0.5 mL under the skin every 7 days. Indications: type 2 diabetes mellitus   ? ferrous gluconate 324 mg (37.5 mg iron) tablet Take one tablet by mouth every 48 hours.   ? finasteride (PROSCAR) 5 mg tablet Take one tablet by mouth daily.   ? fish oil- omega 3-DHA/EPA 300/1,000 mg capsule Take one capsule by mouth daily.   ? flash glucose scanning reader (FREESTYLE LIBRE) reader Use as directed.   ? flash glucose sensor (FREESTYLE LIBRE 14 DAY SENSOR) sensor Type 2 diabetes  Indications: type 2 diabetes mellitus   ? gabapentin (NEURONTIN) 100 mg capsule Take four capsules by mouth twice daily.   ? guaiFENesin (ROBITUSSIN) 100 mg/5 mL oral solution Take 10 mL by mouth every 4 hours as needed.   ? insulin pen needles (disposable) (BD ULTRA-FINE MINI PEN NEEDLE) 31 gauge x 3/16 pen needle Use with insulin pens   ? ipratropium bromide (ATROVENT) 21 mcg (0.03 %) nasal spray Apply two sprays to each nostril as directed every 12 hours.   ? magnesium oxide 400 mg magnesium capsule Take one capsule by mouth twice daily.   ? mepolizumab (NUCALA) 100 mg/mL injection pen Inject 100 mg under the skin every 28 days.   ? metOLazone (ZAROXOLYN) 2.5 mg tablet Take one tablet by mouth daily as needed. PER OUTPATIENT CARDIOLOGY PROVIDER AS NEEDED FOR FLUID RETENTION   ? Miscellaneous Medical Supply misc itted for LE gradual compression stockings at a medical supply store   ? Miscellaneous Medical Supply misc Vancomycin 1,500 mg IV every 24hours starting 06/25/20 x 4 days (14 total antibiotic days), please draw vancomycin level per your protocol at [facility].   ? Miscellaneous Medical Supply misc Rx: Please wrap legs with ACE bandage from toes as high up to the leg as possible bilaterally  Dx: Lymphedema   ? montelukast (SINGULAIR) 10 mg tablet Take one tablet by mouth at bedtime daily.   ? nitroglycerin (NITROSTAT) 0.4 mg tablet Place one tablet under tongue every 5 minutes as needed for Chest Pain. Max of 3 tablets, call 911.   ? nystatin (MYCOSTATIN) 100,000 unit/g topical cream Apply  topically to affected area twice daily.   ? nystatin (MYCOSTATIN) 100,000 unit/g topical ointment Apply to affected areas TID   ? nystatin (NYSTOP) 100,000 unit/g topical powder Apply  topically to affected area four times daily.   ? pantoprazole DR (PROTONIX) 40 mg tablet Take one tablet by mouth twice daily.   ? rivaroxaban (XARELTO) 20 mg tablet Take one tablet by mouth daily with dinner. Take with food.   ? sodium chloride (HYPER-SAL) 3.5 % inhalation solution Inhale 4 mL by mouth into the lungs twice daily. 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 tamsulosin (FLOMAX) 0.4 mg capsule Take one capsule by mouth daily. Do not crush, chew or open capsules. Take 30 minutes following the same meal each day.   ? traMADoL (ULTRAM) 50 mg tablet Take one tablet by mouth every 6 hours as needed for Pain.   ? venlafaxine XR (EFFEXOR XR) 75 mg capsule Take one capsule by mouth daily. Take with food.      Scheduling Follow-up Appointment   Upcoming appointment date and time and with whom scheduled:   Future Appointments   Date Time Provider Department Center   06/15/2021  1:30 PM Little Ishikawa, APRN-NP [facility] Exam   06/27/2021  4:00 PM [facility] REMOTE MONITORING [facility] CVM Procedur   08/09/2021 12:00  PM Comfort, [name], MD [facility] IM   08/24/2021 11:00 AM Arneta Cliche, MD [facility] Exam   08/30/2021  3:00 PM Kathrin Penner [facility] Urology   10/26/2021 10:20 AM Deveron Furlong, MD [facility] IM     PCP appointment scheduled?Yes, Date: 08/09/2021, not contacted to schedule earlier appointment    PCP primary location: [facility] [place] [facility]  Specialist appointment scheduled? Yes, with Cardiology 06/15/2021  Both PCP and Specialist appointment scheduled: Yes  MyChart message sent? Active in MyChart. No message sent.     Maurilio Lovely, RN

## 2021-06-06 ENCOUNTER — Encounter: Admit: 2021-06-06 | Discharge: 2021-06-06 | Payer: MEDICARE

## 2021-06-07 ENCOUNTER — Encounter: Admit: 2021-06-07 | Discharge: 2021-06-07 | Payer: MEDICARE

## 2021-06-07 NOTE — Telephone Encounter
Received notification that  [employer] has not been connected since 05/20/21. (pt was admitted 03/31-04/14)  Patient was instructed to look at his/her transmitter to make sure that it is plugged into power and send a manual transmission to reconnect the transmitter. If he/she has any questions about how to send a transmission or if the transmitter does not appear to be working properly, they need to contact the device company directly. Patient was provided with that contact number. Requested the patient send [place] a MyChart message or contact our device nurses at [phone] to let us know after they have sent their transmission. Placed call to preferred phone number, Spoke with Wife, she will take transmitter to him. She also wanted me to get a message to [place] team in regards to      Hemoglobin dropping. Message sent.                       Patient verbalized understanding.  CDJ  Note: Patient needs to send a manual remote interrogation to reestablish communication to his/her remote transmitter.

## 2021-06-07 NOTE — Telephone Encounter
Received call from [facility] hospital reporting pts hemoglobin was 7.9 yesterday and 7.2 today. Pt will be holding xarelto and will received 1 unit of packed red blood cells today.  No call back number left.

## 2021-06-09 ENCOUNTER — Encounter: Admit: 2021-06-09 | Discharge: 2021-06-09 | Payer: MEDICARE

## 2021-06-09 DIAGNOSIS — Z95810 Presence of automatic (implantable) cardiac defibrillator: Secondary | ICD-10-CM

## 2021-06-13 ENCOUNTER — Encounter: Admit: 2021-06-13 | Discharge: 2021-06-13 | Payer: MEDICARE

## 2021-06-13 NOTE — Telephone Encounter
-----   Message from Jerl Mina, RN sent at 06/13/2021  7:46 AM CDT -----  Regarding: VF and shock  Good Morning,  I received alert on this patient that on 4/21 at 2 PM he had a VF episode and he received ATP while charging and shock delivered restoring rhythm,   Please f/u to see if pt had symptoms.    Diane

## 2021-06-13 NOTE — Telephone Encounter
Spoke to the patient. He reports feeling overall ok. He does have a respiratory flare up right now. He reports being currently in [facility] and has been there for the past week. I let him know we will let them continue to manage and request the records when he discharges.    Reviewed plan with the patient. Patient verbalized understanding and does not have any further questions or concerns. No further education requested from patient. Patient has our contact information for future needs.

## 2021-06-15 ENCOUNTER — Encounter: Admit: 2021-06-15 | Discharge: 2021-06-15 | Payer: MEDICARE

## 2021-06-20 ENCOUNTER — Encounter: Admit: 2021-06-20 | Discharge: 2021-06-20 | Payer: MEDICARE

## 2021-06-22 ENCOUNTER — Encounter: Admit: 2021-06-22 | Discharge: 2021-06-22 | Payer: MEDICARE

## 2021-06-24 ENCOUNTER — Encounter: Admit: 2021-06-24 | Discharge: 2021-06-24 | Payer: MEDICARE

## 2021-06-27 ENCOUNTER — Encounter: Admit: 2021-06-27 | Discharge: 2021-06-27 | Payer: MEDICARE

## 2021-06-27 DIAGNOSIS — K219 Gastro-esophageal reflux disease without esophagitis: Secondary | ICD-10-CM

## 2021-06-27 DIAGNOSIS — J4540 Moderate persistent asthma, uncomplicated: Secondary | ICD-10-CM

## 2021-06-27 DIAGNOSIS — R399 Unspecified symptoms and signs involving the genitourinary system: Secondary | ICD-10-CM

## 2021-06-27 MED ORDER — TAMSULOSIN 0.4 MG PO CAP
ORAL_CAPSULE | ORAL | 0 refills | 90.00000 days | Status: AC
Start: 2021-06-27 — End: ?

## 2021-06-27 MED ORDER — PANTOPRAZOLE 40 MG PO TBEC
40 mg | ORAL_TABLET | Freq: Two times a day (BID) | ORAL | 1 refills | 90.00000 days | Status: AC
Start: 2021-06-27 — End: ?

## 2021-06-28 ENCOUNTER — Encounter: Admit: 2021-06-28 | Discharge: 2021-06-28 | Payer: MEDICARE

## 2021-06-29 ENCOUNTER — Encounter: Admit: 2021-06-29 | Discharge: 2021-06-29 | Payer: MEDICARE

## 2021-06-29 ENCOUNTER — Ambulatory Visit: Admit: 2021-06-29 | Discharge: 2021-06-29 | Payer: MEDICARE

## 2021-06-29 DIAGNOSIS — G4733 Obstructive sleep apnea (adult) (pediatric): Secondary | ICD-10-CM

## 2021-06-29 DIAGNOSIS — I10 Essential (primary) hypertension: Secondary | ICD-10-CM

## 2021-06-29 DIAGNOSIS — I509 Heart failure, unspecified: Secondary | ICD-10-CM

## 2021-06-29 DIAGNOSIS — N401 Enlarged prostate with lower urinary tract symptoms: Secondary | ICD-10-CM

## 2021-06-29 DIAGNOSIS — J189 Pneumonia, unspecified organism: Secondary | ICD-10-CM

## 2021-06-29 DIAGNOSIS — I255 Ischemic cardiomyopathy: Secondary | ICD-10-CM

## 2021-06-29 DIAGNOSIS — M954 Acquired deformity of chest and rib: Secondary | ICD-10-CM

## 2021-06-29 DIAGNOSIS — L03116 Cellulitis of left lower limb: Secondary | ICD-10-CM

## 2021-06-29 DIAGNOSIS — I5042 Chronic combined systolic (congestive) and diastolic (congestive) heart failure: Secondary | ICD-10-CM

## 2021-06-29 DIAGNOSIS — Z959 Presence of cardiac and vascular implant and graft, unspecified: Secondary | ICD-10-CM

## 2021-06-29 DIAGNOSIS — R0600 Dyspnea, unspecified: Secondary | ICD-10-CM

## 2021-06-29 DIAGNOSIS — Z95828 Presence of other vascular implants and grafts: Secondary | ICD-10-CM

## 2021-06-29 DIAGNOSIS — I429 Cardiomyopathy, unspecified: Secondary | ICD-10-CM

## 2021-06-29 DIAGNOSIS — J45909 Unspecified asthma, uncomplicated: Secondary | ICD-10-CM

## 2021-06-29 DIAGNOSIS — N183 CKD (chronic kidney disease) stage 3, GFR 30-59 ml/min (HCC): Secondary | ICD-10-CM

## 2021-06-29 DIAGNOSIS — Z8679 Personal history of other diseases of the circulatory system: Secondary | ICD-10-CM

## 2021-06-29 DIAGNOSIS — R609 Edema, unspecified: Secondary | ICD-10-CM

## 2021-06-29 DIAGNOSIS — E119 Type 2 diabetes mellitus without complications: Secondary | ICD-10-CM

## 2021-06-29 DIAGNOSIS — R053 Chronic cough: Secondary | ICD-10-CM

## 2021-06-29 DIAGNOSIS — I2510 Atherosclerotic heart disease of native coronary artery without angina pectoris: Secondary | ICD-10-CM

## 2021-06-29 DIAGNOSIS — M961 Postlaminectomy syndrome, not elsewhere classified: Secondary | ICD-10-CM

## 2021-06-29 DIAGNOSIS — Z9981 Dependence on supplemental oxygen: Secondary | ICD-10-CM

## 2021-06-29 DIAGNOSIS — U071 Pneumonia due to COVID-19 virus: Secondary | ICD-10-CM

## 2021-06-29 DIAGNOSIS — J449 Chronic obstructive pulmonary disease, unspecified: Secondary | ICD-10-CM

## 2021-06-29 DIAGNOSIS — J9612 Chronic respiratory failure with hypercapnia: Secondary | ICD-10-CM

## 2021-06-29 DIAGNOSIS — Z8614 Personal history of Methicillin resistant Staphylococcus aureus infection: Secondary | ICD-10-CM

## 2021-06-29 DIAGNOSIS — I714 AAA (abdominal aortic aneurysm) (HCC): Secondary | ICD-10-CM

## 2021-06-29 DIAGNOSIS — I5043 Acute on chronic combined systolic (congestive) and diastolic (congestive) heart failure: Secondary | ICD-10-CM

## 2021-06-29 DIAGNOSIS — D509 Iron deficiency anemia, unspecified: Secondary | ICD-10-CM

## 2021-06-29 DIAGNOSIS — Z95810 Presence of automatic (implantable) cardiac defibrillator: Secondary | ICD-10-CM

## 2021-06-29 DIAGNOSIS — E782 Mixed hyperlipidemia: Secondary | ICD-10-CM

## 2021-06-29 DIAGNOSIS — J302 Other seasonal allergic rhinitis: Secondary | ICD-10-CM

## 2021-06-29 DIAGNOSIS — I4891 Unspecified atrial fibrillation: Secondary | ICD-10-CM

## 2021-06-29 DIAGNOSIS — K219 Gastro-esophageal reflux disease without esophagitis: Secondary | ICD-10-CM

## 2021-06-29 DIAGNOSIS — I480 Paroxysmal atrial fibrillation: Secondary | ICD-10-CM

## 2021-06-29 DIAGNOSIS — J479 Bronchiectasis, uncomplicated: Secondary | ICD-10-CM

## 2021-06-29 MED ORDER — CLOPIDOGREL 75 MG PO TAB
75 mg | ORAL_TABLET | Freq: Every day | ORAL | 0 refills | 90.00000 days | Status: DC
Start: 2021-06-29 — End: 2021-06-29

## 2021-06-29 MED ORDER — CLOPIDOGREL 75 MG PO TAB
75 mg | ORAL_TABLET | Freq: Every day | ORAL | 0 refills
Start: 2021-06-29 — End: ?

## 2021-06-29 MED ORDER — CLOPIDOGREL 75 MG PO TAB
75 mg | ORAL_TABLET | Freq: Every day | ORAL | 3 refills | 90.00000 days | Status: DC
Start: 2021-06-29 — End: 2021-06-29

## 2021-06-29 MED ORDER — CLOPIDOGREL 75 MG PO TAB
75 mg | ORAL_TABLET | Freq: Every day | ORAL | 0 refills | 90.00000 days | Status: AC
Start: 2021-06-29 — End: ?

## 2021-06-29 MED FILL — SODIUM CHLORIDE 3.5 % IN NEBU: 3.5 % | RESPIRATORY_TRACT | 30 days supply | Qty: 240 | Fill #3 | Status: CP

## 2021-06-29 MED FILL — ALBUTEROL SULFATE 2.5 MG /3 ML (0.083 %) IN NEBU: 2.5 mg /3 mL (0.083 %) | 19 days supply | Qty: 225 | Fill #3 | Status: AC

## 2021-06-29 MED FILL — ARFORMOTEROL 15 MCG/2 ML IN NEBU: 15 mcg/2 mL | 30 days supply | Qty: 120 | Fill #3 | Status: AC

## 2021-06-29 MED FILL — BUDESONIDE 0.25 MG/2 ML IN NBSP: 0.25 mg/2 mL | 15 days supply | Qty: 60 | Fill #3 | Status: AC

## 2021-06-29 NOTE — Patient Instructions
Take 4 plavix and 1 aspirin 81 mg today.    Tomorrow start plavix 75 mg daily.  Take metolazone 30 minutes before bumex tomorrow morning.   Weigh yourself daily.

## 2021-06-29 NOTE — Progress Notes
Please fax the following records STAT for this patient for continuation of care purposes to:   [phone]  Attn:  Dawn Anderson/ Kassia Demarinis  DOB: 04/14/1940    1.  Office notes  2.  EKG's  3.  Recent Cardiac Testing/labs/procedures  4.  H & P/Discharge summary  5.  Operative reports    Thank You!

## 2021-06-29 NOTE — Progress Notes
New [facility] orders and scheduled billing completed.

## 2021-06-29 NOTE — Progress Notes
Date of Service: 06/29/2021    Ruben Wood. Ruben Wood is a 81 y.o. male.       HPI       I had the pleasure of seeing [name]. for hospital follow-up.  He is an 81 year old who follows with Dr. Vanetta Shawl.  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  He has a CRT-D and CardioMEMS in place.  He had recurrent nosebleeds and was evaluated for Watchman placement but is not a candidate because he is high risk for anesthesia.    He was admitted from March 31 through April 14 for V-fib and ICD shock.  He had recently been treated for COPD exacerbation with prednisone and levofloxacin.  He was also noted to be in heart failure.  He had a cardiac catheterization which revealed severe distal left main stenosis and 70% ostial stenosis to a jailed marginal branch of the circumflex.  CTS was consulted but he was high risk for surgery therefore he underwent complex PCI with a stent to the left main into the LAD.  Impella was used for hemodynamic support.  He also had balloon angioplasty of the proximal left circumflex artery.  His hospitalization was complicated by sepsis likely related to pneumonia.  He was treated with IV antibiotics and transitioned to p.o. at discharge.  GDMT was unable to be initiated due to hypotension.  He was discharged to a SNF for rehab.    I do not have any records or medication list from the rehab but his wife states that he had significant issues with fevers and low hemoglobin during his stay there which they had to address therefore PT and OT were delayed.  He was just sent home last Friday.  He had increasing congestion and cough and went to the emergency room on Monday where they gave him a steroid injection.  I am working on getting records for that visit.      He continues to wear oxygen.  He is short of breath with exertion and has a productive cough.  He has had an increase in swelling to his left lower leg which is firm and taut but there is no redness noted.  He does have some blistering and oozing to the leg.  He states he is taking Bumex 3 mg twice a day.  His wife is concerned because they never sent in a prescription for Plavix and they believe he has not been taking it for the last 5 days.  Xarelto was also discontinued because of bleeding and anemia.  His wife states that this was discussed with Dr. Sherryll Burger over the phone.       Vitals:    06/29/21 1358   BP: 124/56   BP Source: Arm, Left Upper   Pulse: 76   PainSc: Zero   Weight: 118.7 kg (261 lb 9.6 oz)   Height: 172.7 cm (5' 8)     Body mass index is 39.78 kg/m?[name]     Past Medical History  Patient Active Problem List    Diagnosis Date Noted   ? Complex care coordination 05/04/2017     Priority: High     Class: Acute   ? Ventricular fibrillation (HCC) 05/23/2021   ? Moderate mitral regurgitation 05/23/2021   ? Sacroiliitis (HCC) 04/26/2021   ?  ICD (implantable cardioverter-defibrillator) discharge 11/24/2020     11/24/20 Successful Dual-chamber cardiac resynchronization therapy (CRT) defibrillator generator replacement ([facility]) by Dr. Milas Kocher       ? Iron deficiency anemia 09/30/2020   ? Peripheral vascular disease (HCC) 06/22/2020   ? Epistaxis 05/14/2020     Was a problem after starting anticoagulation 04/2020, but this has been an ongoing issue for him. On O2 via NC at baseline.         ? Gout 04/03/2020   ? Dyspnea 04/02/2020   ? 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 Chronic bronchitis (HCC) 11/25/2019   ? Chronic bronchitis with acute exacerbation (HCC) 12/05/2018   ? 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 Type 2 diabetes mellitus with hyperglycemia, without long-term current use of insulin (HCC) 03/12/2018   ? Atrial fibrillation (HCC) 03/12/2018   ? Orthostatic hypotension 12/28/2017   ? Chronic respiratory failure with hypercapnia (HCC) 11/16/2017   ? Depression 11/15/2017   ? OAB (overactive bladder)      Urinary frequency, urgency, urge urinary incontinence (UUI), nocturia, nocturnal enuresis.  -- trial Mirabegron 25 mg --> improved, but persistent sx's.  -- trial Mirabegron 50 mg.     ? Urinary incontinence, urge      See OAB A&P note.     ? Bronchiectasis with acute lower respiratory infection (HCC) 05/10/2017   ? 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     ? Benign prostatic hyperplasia (BPH) 03/01/2017     TRUS Prostate (03/01/2017): Prostate volume = 38.3 mL.  D/c'd Tamsulosin d/t lack of symptom improvement.     ? Enrolled in chronic care management 02/28/2017   ? History of elevated prostate specific antigen (PSA)      PNBx (03/01/2017): (L) high-grade prostatic intraepithelial neoplasia (HG PIN), 1/6 cores; [name], PA-C.     ? Spondylolisthesis, lumbar region 12/13/2016   ? Lumbar post-laminectomy syndrome      L4-5     ? Spinal stenosis of lumbosacral region 09/20/2016   ? Spondylolisthesis of lumbosacral region 09/20/2016   ? Osteoarthritis of spine with radiculopathy, lumbosacral region 09/20/2016   ? 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 He had a normal response to pneumococca vaccination; he had 17/23 positive serotypes. He has normal tetanus toxoid Ab and CH50.  His T&B cell panel revealed a mildly low B-cell count at 68 right after a time of acute illness.  He had positive diphtheria antibody.    Likely multifactorial with significant comorbidities including frequent steroid use for bronchiectasis exacerbations, frequent infections, and possibly a nutritional component (low total protein).    Previously on doxycycline once daily for recurrent infections, which he initially felt had been tremendously helpful, but he has had a couple episodes of what may be pneumonia in early 2022 when he was off.     Plan:  - Restart doxycycline 100 mg daily.  - Recommend getting the Pneumovax immunization next time he is in clinic in person and will need repeat pneumococcal antibody levels 1 month afterwards once he has had resolution of his COVID issues.          ? Moderate persistent asthma with acute exacerbation 02/25/2016     He  has had asthma since sometime in his 20-30s. He gets cough, chest tightness, wheezing, shortness of breath that is year round with worsening in the Spring and Fall.   Triggers: URI, hot/cold air. Additionally he has been diagnosed with bronchiectasis and emphysema; his PFTs suggest both obstructive and restrictive disease and asthma is unlikely to be the sole cause of his dyspnea.    He has negative IgE immunocaps to aeroallergens which makes the possibility of allergic asthma highly unlikely. His IgE level was 17 and he had negative ANCAs, MPO and PR3.     Repeatedly being treated with steroids now for flares.  Also on hypertonic saline nebs, DuoNebs, budesnoide/Brovana via neb, and Singulair as well as aggressive pulmonary clearance with vest and Aerobika.        ? 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 He is not having issues with urticaria.    - We recommend IgE immunocaps to aeroallergens.      ? Recurrent infections 02/25/2016     Recurrent sinopulmonary infections requiring antibiotics 5-10 times per year.   Cellulitis and non-healing wounds.   Many underlying illnesses predisposing him to these infections in addition to intermittent hypogammaglobulinemia associated with active infections and systemic steroids.       ? S/P left pulmonary artery pressure sensor implant placement (CardioMEMs)  02/02/2016     * Patient has CardioMEMs (Pulmonary Artery Pressure Sensor)* Please call Matthew Folks, CardioMEMs Program Coordinator [phone] or page Heart Failure Rounding Team if patient is admitted or presents to ED*  03/30/15 implant     ? Ischemic cardiomyopathy 01/12/2016   ? ICD (implantable cardioverter-defibrillator), biventricular, in situ 12/07/2015     11/24/20 Successful Dual-chamber cardiac resynchronization therapy (CRT) defibrillator generator replacement ([facility]) by Dr. Milas Kocher     ? Hypercholesterolemia 11/25/2015   ? 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 Bronchiectasis without complication (HCC) 06/18/2015     Non-sputum producer.  Currently on Symbicort.  - Dr. [name] was able to get him a vest to wear at home for secretions and this has helped immensely with his secretions and breathing.      ? Cellulitis of left lower extremity 11/12/2014     10/2014: admitted with cellulitis, [place] negative for venous clot, MRI without osteomyelitis.      11/12/2014 In clinic today, still with cellulitis.  Per patient and his wife, the erythema may be extending.  Cultures from drainage in hospital with MSSA, but Blood Cx negative.  No e/o osteomyelitis.  My concern is that this antibiotic regimen is not adequately treating his cellulitis.  We will broaden coverage to get MRSA with doxycycline.  Patient and wife given strict call/return criteria.  I will see patient in 3-4 days for re-evaluation.  No fever today.   Plan:  Stop cefpodoxime and start doxycylcine    11/17/2014 Much better, no warmth and erythema minimal.    Plan: continue doxycyline for full 10 day course.        ? Osteoarthritis of spine with radiculopathy, lumbar region 07/28/2014   ? Acute on chronic combined systolic and diastolic congestive heart failure, NYHA class 2 (HCC) 07/19/2014  11/04/14: EF 20%, severely dilated LV with grade 1 diastolic dysfunction.  11/12/2014 In clinic today, patient appears fairly well compensated.  He does have some LEE, but no significant rales (just some at bases), no JVD sitting upright, and no sx to suggest decompensation.  We will continue metoprolol, spironolactone, and bumex at current doses.    11/17/2014 Edema much improved in lower ext, Cr trending down on last labs.  Will recheck labs today.  Advised to weigh himself daily.  Rechecking BMP today to ensure not overdoing diuretics.     On metoprolol, spironolactone.  Will need to explore allergy to ARB more as patient may benefit from ACEI.      03/30/15 CardioMEMS implant by Dr. Chales Abrahams  1. Normal cardiac output and cardiac index.  2. Normal pulmonary pressures and normal pulmonary capillary wedge pressure.  3. Successful insertion of CardioMEMS pulmonary artery pressure sensor    05/04/2021 - ECHO:  The left ventricular systolic function is severely reduced. The visually estimated ejection fraction is 25%.  Severe LV dilatation, severe global hypokinesis.  The right ventricular size, wall thickness and systolic function are normal. ICD lead is present.  Moderate mitral valve regurgitation, mild tricuspid valve regurgitation.  The pulmonary artery pressure could not be estimated due to inadequate tricuspid regurgitation signal       ? 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      ? Stage 3 chronic kidney disease (HCC) 08/26/2013   ? Mixed dyslipidemia 08/26/2013     Hepatic Function    Lab Results   Component Value Date/Time    ALBUMIN 4.0 11/04/2014 12:26 AM    TOTAL PROTEIN 6.9 11/04/2014 12:26 AM    ALK PHOSPHATASE 61 11/04/2014 12:26 AM    Lab Results   Component Value Date/Time    AST (SGOT) 41* 11/04/2014 12:26 AM    ALT (SGPT) 19 11/04/2014 12:26 AM    TOTAL BILIRUBIN 1.4* 11/04/2014 12:26 AM        Lab Results   Component Value Date    CHOL 148 12/15/2013    TRIG 122 12/15/2013    HDL 51 12/15/2013    LDL 88 12/15/2013    VLDL 24 12/15/2013    NONHDLCHOL 97 12/15/2013       BP Readings from Last 3 Encounters:   11/12/14 129/80   11/09/14 111/44   11/04/14 146/65     Plan:   81 y.o. with known CAD with CTO of RCA and obtuse marginal branch with high grade stenosis (90%) in mid left circ.    Advised heart healthy diet and daily exercise.    Continue high intensity statin, atorvastatin       ? AAA (abdominal aortic aneurysm) (HCC) 10/15/2012     Duplex done at OSH in 2007 and 2008 ~ 4.1 cm    Duplex 10/15/12-AAA 7.3 cm AP x 7.3 cm       ? History of MRSA infection 10/14/2012   ? CAD (coronary artery disease), native coronary artery 08/15/2012     07/11/2002- Cath @ [facility] showed Severe double vessel disease(OMB of circumflex  and distal circ)  inferior-basilar dyskinesis.                  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    09/12/12   Cath - [facility]:  CTO of the right coronary artery, CTO of the obtuse marginal branch and high-grade stenosis of   90% in the mid left circumflex artery No significant stenosis in the left anterior descending artery or left main vessel     Failed attempt in opening 2nd obtuse marginal CTO as described above.    10/14/12: Unsuccessful attempt to open CTO of OMB and mid-CFX by Dr. Mackey Birchwood      04/02/17: Cath by Dr. Steward Ros:   4. Chronic total occlusion of the circumflex.  5. Chronic total occlusion of the right coronary artery, status post percutaneous coronary intervention with drug-eluting stent times 2.  6. Small distal right coronary artery perforation without echo or hemodynamic evidence of compromise.    06/29/17-cardiac catheterization by Dr. Steward Ros. One DES placed to chronic total occlusion of the circumflex artery. The RCA stent was patent.    05/23/2021 - Cardiac Catheterization:  Severe distal left main calcified stenosis confirmed with abnormal resting full-cycle ratio and abnormal intravascular ultrasound.  70% ostial stenosis and a jailed 1st marginal artery, which is jailed by a circumflex artery stent.  Normal left ventricular filling pressures and central aortic pressure.    05/27/2021 - Cardiac Catheterization:  Successful percutaneous coronary intervention of the left main coronary artery into the proximal left anterior descending artery with a 4.0 X 18 mm Xience Skypoint DES post-dilated with 5.0 NC.  We used Impella CP for hemodynamic support.  Shockwave lithotripsy with a 4.0 mm C2 IVL catheter, and intravascular ultrasound were utilized for the procedure.  Balloon angioplasty of the proximal left circumflex artery with a 2.5 mm balloon.  The minimum stent area of the proximal left anterior descending artery was 12.8 mm, distal left main was 16.5 mm, and the ostial left main was 17.4 sq mm.     ? OSA on CPAP 08/13/2012     CPAP at  DME: [facility]    Split night:  08/14/2017  AHI 43.2  REM n/a  Time below 88 %: 98 mins  Lowest sat: 83%     CPAP 10 cm h20 effective      ? COPD (chronic obstructive pulmonary disease) (HCC) 08/13/2012     04/28/13: PFTs with more restrictive pattern with FEV1 50%, FEV1/FVC 89%, DLCO 65%.    12/27/13: PFTs with FEV1 82%, FEV1/FVC 113%, and DLCO 77%     ? 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  Discussed the importance of diet.  Exercise as tolerated.        ? Essential hypertension 08/13/2012   ? Chest wall deformity 07/09/2012     Chest wall reconstruction on 07/09/12  Lung herniation- bio bridge           Review of Systems   Constitutional: Negative.   HENT: Negative.    Eyes: Negative.  Cardiovascular: Positive for leg swelling.   Respiratory: Negative.    Endocrine: Negative.    Hematologic/Lymphatic: Negative.    Skin: Negative.    Musculoskeletal: Negative.    Gastrointestinal: Negative.    Genitourinary: Negative.    Neurological: Negative.    Psychiatric/Behavioral: Negative.    All other systems reviewed and are negative.      Physical Exam  General Appearance: no acute distress, obese  Skin: multiple bandaids to arms and legs, blistering to left shin.  Bilateral lower extremity skin discoloration  HEENT: unremarkable  Neck Veins: neck veins are flat & not distended  Carotid Arteries: no bruits  Chest Inspection: chest is normal in appearance  Auscultation/Percussion: on supplemental O2, crackles throughout, expiratory wheezing  Cardiac Rhythm: regular rhythm & normal rate  Cardiac Auscultation: Normal S1 & S2, no S3 or S4, no rub  Murmurs: no cardiac murmurs   Extremities: 3+ edema to left leg which is firm; 2+ symmetric distal pulses  Abdominal Exam: soft, non-tender, no masses, bowel sounds normal  Liver & Spleen: no organomegaly  Neurologic Exam: oriented to time, place and person; no focal neurologic deficits  Psychiatric: Normal mood and affect.  Behavior is normal. Judgment and thought content normal.           Cardiovascular Health Factors  Vitals BP Readings from Last 3 Encounters:   06/29/21 124/56   06/03/21 135/54 05/11/21 109/66     Wt Readings from Last 3 Encounters:   06/29/21 118.7 kg (261 lb 9.6 oz)   06/03/21 115.2 kg (253 lb 15.5 oz)   05/04/21 118.8 kg (262 lb)     BMI Readings from Last 3 Encounters:   06/29/21 39.78 kg/m?   06/03/21 35.42 kg/m?   05/11/21 36.54 kg/m?      Smoking Social History     Tobacco Use   Smoking Status Former   ? Packs/day: 2.00   ? Years: 40.00   ? Pack years: 80.00   ? Types: Cigarettes   ? Quit date: 07/09/1998   ? Years since quitting: 22.9   Smokeless Tobacco Never   Vaping Use   ? Vaping Use: Never used      Lipid Profile Cholesterol   Date Value Ref Range Status   04/04/2020 161 <200 MG/DL Final     HDL   Date Value Ref Range Status   04/04/2020 53 >40 MG/DL Final     LDL   Date Value Ref Range Status   04/04/2020 87 <100 mg/dL Final     Triglycerides   Date Value Ref Range Status   04/04/2020 152 (H) <150 MG/DL Final      Blood Sugar Hemoglobin A1C   Date Value Ref Range Status   05/11/2021 6.1 (H) 4.0 - 5.7 % Final     Comment:     The ADA recommends that most patients with type 1 and type 2 diabetes maintain   an A1c level <7%.       Glucose   Date Value Ref Range Status   06/03/2021 109 (H) 70 - 100 MG/DL Final   16/11/9602 540 (H) 70 - 100 MG/DL Final   98/12/9145 829 (H) 70 - 100 MG/DL Final     Glucose Fasting   Date Value Ref Range Status   12/15/2014 111  Final     Glucose, POC   Date Value Ref Range Status   06/03/2021 126 (H) 70 - 100 MG/DL Final   56/21/3086 578 (H) 70 - 100  MG/DL Final   45/40/9811 914 (H) 70 - 100 MG/DL Final          Problems Addressed Today  Encounter Diagnoses   Name Primary?   ? Coronary artery disease involving native coronary artery of native heart without angina pectoris Yes   ? Acute on chronic combined systolic and diastolic congestive heart failure, NYHA class 2 (HCC)    ? Chronic respiratory failure with hypercapnia (HCC)    ? Essential hypertension    ? ICD (implantable cardioverter-defibrillator), biventricular, in situ    ? Ischemic cardiomyopathy    ? Paroxysmal atrial fibrillation (HCC)        Assessment and Plan       1.  Coronary artery disease.  He had PCI to the left main into the LAD in April.  Apparently he has been off Plavix for a few days.  I will send a prescription in to our pharmacy here.  He should take 300 mg of Plavix today in addition to 81 mg of aspirin then continue daily.  He denies any anginal symptoms.    2.  Acute on chronic systolic and diastolic heart failure.  He has NYHA class III heart failure symptoms.  He does have some lower extremity edema especially to the left leg.  He has some metolazone at home and I will have him take a dose in the morning.  He is needs to weigh himself at home daily and we can make further adjustments to his medications as needed.  He currently states he is taking Bumex 3 mg daily but I am working on getting an updated medication list.    3.  Ischemic cardiomyopathy.  EF is around 35%.  He has a CRT-D and CardioMEMS in place.    4.  Paroxysmal A-fib.  He has had anemia and bleeding issues therefore Xarelto is being held for the time being.  He is not a candidate for Watchman.    5.  Ventricular arrhythmias.  He was initially admitted for V-fib and ICD shock.     6.  Chronic respiratory failure and COPD with O2 dependence.    He needs close follow-up to avoid readmission.  Thank you for allowing [place] to participate in the care of this pleasant individual.  If you have any other questions or concerns, please do not hesitate to contact us.    Sherrlyn Hock, APRN  Structural Heart Nurse Practitioner  Pager [phone]             Current Medications (including today's revisions)  ? acetaminophen (TYLENOL) 325 mg tablet Take two tablets by mouth every 6 hours as needed for Pain.   ? albuterol 0.083% (PROVENTIL) 2.5 mg /3 mL (0.083 %) nebulizer solution Inhale 3 mL solution by nebulizer as directed every 6 hours as needed for Wheezing or Shortness of Breath. Indications: asthma   ? albuterol sulfate (PROAIR HFA) 90 mcg/actuation HFA aerosol inhaler Inhale two puffs by mouth into the lungs every 4 hours as needed for Wheezing or Shortness of Breath.   ? allopurinoL (ZYLOPRIM) 300 mg tablet Take one tablet by mouth daily. take with food   ? amiodarone (PACERONE) 400 mg tablet Take one tablet by mouth daily. Take with food.   ? amitr-gabapen-emu oil 05-24-08% topical cream (COMPOUND) Apply to affected areas twice daily as needed.   ? arformoteroL (BROVANA) 15 mcg/2 mL nebulizer solution Inhale 2 mL solution by nebulizer as directed twice daily. Dx: J44.9   ? ascorbic  acid-ascorbate sodium 500 mg chew Chew one tablet by mouth every morning.   ? atorvastatin (LIPITOR) 40 mg tablet Take 1 tablet by mouth once daily   ? azelastine (ASTELIN) 137 mcg (0.1 %) nasal spray Apply two sprays to each nostril as directed twice daily. Use in each nostril as directed   ? benzonatate (TESSALON PERLES) 100 mg capsule Take one capsule by mouth every 8 hours.   ? budesonide (PULMICORT) 0.25 mg/2 mL nebulizer solution Inhale 2 mL solution by nebulizer as directed twice daily. Dx: J44.9   ? bumetanide (BUMEX) 2 mg tablet Take one tablet by mouth daily.   ? cholecalciferol (VITAMIN D-3) 1,000 units tablet Take one tablet by mouth daily.   ? clopiDOGreL (PLAVIX) 75 mg tablet Take one tablet by mouth daily.   ? dulaglutide (TRULICITY) 0.75 mg/0.5 mL injection pen Inject 0.5 mL under the skin every 7 days. Indications: type 2 diabetes mellitus   ? ferrous gluconate 324 mg (37.5 mg iron) tablet Take one tablet by mouth every 48 hours.   ? finasteride (PROSCAR) 5 mg tablet Take one tablet by mouth daily.   ? fish oil- omega 3-DHA/EPA 300/1,000 mg capsule Take one capsule by mouth daily.   ? flash glucose scanning reader (FREESTYLE LIBRE) reader Use as directed.   ? flash glucose sensor (FREESTYLE LIBRE 14 DAY SENSOR) sensor Type 2 diabetes  Indications: type 2 diabetes mellitus   ? gabapentin (NEURONTIN) 100 mg capsule Take four capsules by mouth twice daily.   ? guaiFENesin (ROBITUSSIN) 100 mg/5 mL oral solution Take 10 mL by mouth every 4 hours as needed.   ? insulin pen needles (disposable) (BD ULTRA-FINE MINI PEN NEEDLE) 31 gauge x 3/16 pen needle Use with insulin pens   ? ipratropium bromide (ATROVENT) 21 mcg (0.03 %) nasal spray Apply two sprays to each nostril as directed every 12 hours.   ? magnesium oxide 400 mg magnesium capsule Take one capsule by mouth twice daily.   ? mepolizumab (NUCALA) 100 mg/mL injection pen Inject 100 mg under the skin every 28 days.   ? metOLazone (ZAROXOLYN) 2.5 mg tablet Take one tablet by mouth daily as needed. PER OUTPATIENT CARDIOLOGY PROVIDER AS NEEDED FOR FLUID RETENTION   ? Miscellaneous Medical Supply misc itted for LE gradual compression stockings at a medical supply store   ? Miscellaneous Medical Supply misc Vancomycin 1,500 mg IV every 24hours starting 06/25/20 x 4 days (14 total antibiotic days), please draw vancomycin level per your protocol at [facility]   ? Miscellaneous Medical Supply misc Rx: Please wrap legs with ACE bandage from toes as high up to the leg as possible bilaterally  Dx: Lymphedema   ? montelukast (SINGULAIR) 10 mg tablet Take one tablet by mouth at bedtime daily.   ? nitroglycerin (NITROSTAT) 0.4 mg tablet Place one tablet under tongue every 5 minutes as needed for Chest Pain. Max of 3 tablets, call 911.   ? nystatin (MYCOSTATIN) 100,000 unit/g topical cream Apply  topically to affected area twice daily.   ? nystatin (MYCOSTATIN) 100,000 unit/g topical ointment Apply to affected areas TID   ? nystatin (NYSTOP) 100,000 unit/g topical powder Apply  topically to affected area four times daily.   ? pantoprazole DR (PROTONIX) 40 mg tablet Take one tablet by mouth twice daily.   ? rivaroxaban (XARELTO) 20 mg tablet Take one tablet by mouth daily with dinner. Take with food.   ? sodium chloride (HYPER-SAL) 3.5 % inhalation solution Inhale  4 mL by mouth into the lungs twice daily. 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 tamsulosin (FLOMAX) 0.4 mg capsule TAKE 1 CAPSULE BY MOUTH ONCE DAILY (DO  NOT  CRUSH,  CHEW  OR  OPEN  CAPSULES  (TAKE  30  MINUTES  FOLLOWING  THE  SAME  MEAL  EACH  DAY).   ? traMADoL (ULTRAM) 50 mg tablet Take one tablet by mouth every 6 hours as needed for Pain.   ? venlafaxine XR (EFFEXOR XR) 75 mg capsule Take one capsule by mouth daily. Take with food.

## 2021-06-30 ENCOUNTER — Ambulatory Visit: Admit: 2021-06-30 | Discharge: 2021-07-01 | Payer: MEDICARE

## 2021-06-30 ENCOUNTER — Encounter: Admit: 2021-06-30 | Discharge: 2021-06-30 | Payer: MEDICARE

## 2021-06-30 DIAGNOSIS — I5043 Acute on chronic combined systolic (congestive) and diastolic (congestive) heart failure: Secondary | ICD-10-CM

## 2021-06-30 DIAGNOSIS — J209 Acute bronchitis, unspecified: Secondary | ICD-10-CM

## 2021-06-30 DIAGNOSIS — B348 Other viral infections of unspecified site: Secondary | ICD-10-CM

## 2021-06-30 DIAGNOSIS — J9612 Chronic respiratory failure with hypercapnia: Secondary | ICD-10-CM

## 2021-06-30 MED ORDER — AMOXICILLIN-POT CLAVULANATE 875-125 MG PO TAB
1 | ORAL_TABLET | Freq: Two times a day (BID) | ORAL | 0 refills | 7.00000 days | Status: AC
Start: 2021-06-30 — End: ?

## 2021-06-30 MED ORDER — PREDNISONE 10 MG PO TAB
ORAL_TABLET | ORAL | 0 refills | Status: DC
Start: 2021-06-30 — End: 2021-06-30

## 2021-06-30 MED ORDER — PREDNISONE 10 MG PO TAB
ORAL_TABLET | ORAL | 0 refills | Status: AC
Start: 2021-06-30 — End: ?

## 2021-06-30 NOTE — Patient Instructions
Future Appointments   Date Time Provider Department Center   07/19/2021  1:50 PM Deveron Furlong, MD [facility] IM   07/28/2021 12:00 AM MAC REMOTE MONITORING MACREMOTEHRM CVM Procedur   08/09/2021 12:00 PM Patrice Matthew, [name], MD [facility] IM   08/24/2021 10:30 AM CVM SN PACEMAKER SNHRM CVM Procedur   08/24/2021 11:00 AM Vanetta Shawl I, MD CVMSNCL CVM Exam   08/29/2021 12:00 AM MAC REMOTE MONITORING MACREMOTEHRM CVM Procedur   08/30/2021  3:00 PM Raelyn Mora, PA-C [facility] Urology   09/29/2021 12:00 AM MAC REMOTE MONITORING MACREMOTEHRM CVM Procedur   10/26/2021  9:00 AM PF LAB SCHEDULE A PULMFN1 None   10/26/2021 10:20 AM Deveron Furlong, MD [facility] IM   10/31/2021 12:00 AM MAC REMOTE MONITORING MACREMOTEHRM CVM Procedur   12/01/2021 12:00 AM MAC REMOTE MONITORING MACREMOTEHRM CVM Procedur   01/02/2022 12:00 AM MAC REMOTE MONITORING MACREMOTEHRM CVM Procedur   01/24/2022  1:45 PM Donnelly Stager, MD [facility] CVM Exam   02/02/2022 12:00 AM MAC REMOTE MONITORING MACREMOTEHRM CVM Procedur   03/06/2022 12:00 AM MAC REMOTE MONITORING MACREMOTEHRM CVM Procedur   04/06/2022 12:00 AM MAC REMOTE MONITORING MACREMOTEHRM CVM Procedur   05/08/2022 12:00 AM MAC REMOTE MONITORING MACREMOTEHRM CVM Procedur   06/08/2022 12:00 AM MAC REMOTE MONITORING MACREMOTEHRM CVM Procedur       If you are on Mychart, you will now be able to read your clinic note from me.  My hope is that this will improve your engagement and insight into your health care and improve the accuracy of your medical record.  If you find inaccuracies, I apologize.  Please bring any concerns or inaccuracies to your next appointment.  Please remember that medical language and documentation is very different from how you and I speak and our dictation software is not perfect.    General Instructions:  How to reach me:   Please send a MyChart message to the General Medicine clinic or call Wynona Canes at [phone].    How to get a medication refill:  Please use the MyChart Refill request or contact your pharmacy directly to request medication refills. Please allow 48 hours.     How to receive your test results:  If you have signed up for MyChart, you will receive your test results and messages from me this way.  Otherwise, you will get a phone call or letter.   If you are expecting results and have not heard from my office within 2 weeks of your testing, please send a MyChart message or call my office.     Scheduling:  Our Scheduling phone number is [phone].  Same Day appointments are usually available. Please ask for an annual or yearly physical appointment if it has been over 1 year since our last appointment.  Support groups for many chronic illnesses are available through [facility]: [email or web address] or [phone].

## 2021-06-30 NOTE — Progress Notes
History of Present Illness  - Patient has been experiencing coughing, wheezing, and shortness of breath for several weeks. They have a recent history of lung infection for which he was admitted to [facility], as well as rigors and high fever. He improved in the hospital with prednisone and an IV antibiotic, he did not know which one. He got home from the hospital last Friday. He started having increased SOB and wheezes.  He is using his albuterol. His oxygen level have remained in the mid 90s on his baseline oxygen of 4L. He denies chest pain. He denies increased lower extremity edema.    Assessment & Plan  - Chronic bronchitis exacerbation: Patient has been experiencing shortness of breath, coughing, and wheezing. The plan is to start a prednisone taper and prescribe Augmentin as an antibiotic. The patient is advised to monitor their symptoms and contact the doctor early next week for a follow-up.    Heart failure and fluid retention: Patient has a history of heart failure and is currently experiencing fluid retention. The plan is to continue treatment with metolazone and Bumex, and closely monitor the patient's labs. The patient is advised to take another metolazone before Bumex and update the doctor on their condition early next week.

## 2021-06-30 NOTE — Progress Notes
History of Present Illness  Ruben Wood. is a 81 y.o. male with a complex medical history is here today for a telehealth urgent care visit.     Patient has been experiencing coughing, wheezing, and shortness of breath for several weeks. They have a recent history of lung infection for which he was admitted to [facility], as well as rigors and high fever. He improved in the hospital with prednisone and an IV antibiotic, he did not know which one. He got home from the hospital last Friday. He started having increased SOB and wheezes.  He is using his albuterol. His oxygen level have remained in the mid 90s on his baseline oxygen of 4L. He denies chest pain. He denies increased lower extremity edema.        Review of Systems  Per HPI    Objective:         ? acetaminophen (TYLENOL) 325 mg tablet Take two tablets by mouth every 6 hours as needed for Pain.   ? albuterol 0.083% (PROVENTIL) 2.5 mg /3 mL (0.083 %) nebulizer solution Inhale 3 mL solution by nebulizer as directed every 6 hours as needed for Wheezing or Shortness of Breath. Indications: asthma   ? albuterol sulfate (PROAIR HFA) 90 mcg/actuation HFA aerosol inhaler Inhale two puffs by mouth into the lungs every 4 hours as needed for Wheezing or Shortness of Breath.   ? allopurinoL (ZYLOPRIM) 300 mg tablet Take one tablet by mouth daily. take with food   ? amiodarone (PACERONE) 400 mg tablet Take one tablet by mouth daily. Take with food.   ? amitr-gabapen-emu oil 05-24-08% topical cream (COMPOUND) Apply to affected areas twice daily as needed.   ? amoxicillin-potassium clavulanate (AUGMENTIN) 875/125 mg tablet Take one tablet by mouth every 12 hours for 7 days. Take with food.   ? arformoteroL (BROVANA) 15 mcg/2 mL nebulizer solution Inhale 2 mL solution by nebulizer as directed twice daily. Dx: J44.9   ? ascorbic acid-ascorbate sodium 500 mg chew Chew one tablet by mouth every morning.   ? atorvastatin (LIPITOR) 40 mg tablet Take 1 tablet by mouth once daily   ? azelastine (ASTELIN) 137 mcg (0.1 %) nasal spray Apply two sprays to each nostril as directed twice daily. Use in each nostril as directed   ? benzonatate (TESSALON PERLES) 100 mg capsule Take one capsule by mouth every 8 hours.   ? budesonide (PULMICORT) 0.25 mg/2 mL nebulizer solution Inhale 2 mL solution by nebulizer as directed twice daily. Dx: J44.9   ? bumetanide (BUMEX) 2 mg tablet Take one tablet by mouth daily.   ? cholecalciferol (VITAMIN D-3) 1,000 units tablet Take one tablet by mouth daily.   ? clopiDOGreL (PLAVIX) 75 mg tablet Take one tablet by mouth daily.   ? dulaglutide (TRULICITY) 0.75 mg/0.5 mL injection pen Inject 0.5 mL under the skin every 7 days. Indications: type 2 diabetes mellitus   ? ferrous gluconate 324 mg (37.5 mg iron) tablet Take one tablet by mouth every 48 hours.   ? finasteride (PROSCAR) 5 mg tablet Take one tablet by mouth daily.   ? fish oil- omega 3-DHA/EPA 300/1,000 mg capsule Take one capsule by mouth daily.   ? flash glucose scanning reader (FREESTYLE LIBRE) reader Use as directed.   ? flash glucose sensor (FREESTYLE LIBRE 14 DAY SENSOR) sensor Type 2 diabetes  Indications: type 2 diabetes mellitus   ? gabapentin (NEURONTIN) 100 mg capsule Take four capsules by mouth twice daily.   ?  guaiFENesin (ROBITUSSIN) 100 mg/5 mL oral solution Take 10 mL by mouth every 4 hours as needed.   ? insulin pen needles (disposable) (BD ULTRA-FINE MINI PEN NEEDLE) 31 gauge x 3/16 pen needle Use with insulin pens   ? ipratropium bromide (ATROVENT) 21 mcg (0.03 %) nasal spray Apply two sprays to each nostril as directed every 12 hours.   ? magnesium oxide 400 mg magnesium capsule Take one capsule by mouth twice daily.   ? mepolizumab (NUCALA) 100 mg/mL injection pen Inject 100 mg under the skin every 28 days.   ? metOLazone (ZAROXOLYN) 2.5 mg tablet Take one tablet by mouth daily as needed. PER OUTPATIENT CARDIOLOGY PROVIDER AS NEEDED FOR FLUID RETENTION   ? Miscellaneous Medical Supply misc itted for LE gradual compression stockings at a medical supply store   ? Miscellaneous Medical Supply misc Vancomycin 1,500 mg IV every 24hours starting 06/25/20 x 4 days (14 total antibiotic days), please draw vancomycin level per your protocol at [facility].   ? Miscellaneous Medical Supply misc Rx: Please wrap legs with ACE bandage from toes as high up to the leg as possible bilaterally  Dx: Lymphedema   ? montelukast (SINGULAIR) 10 mg tablet Take one tablet by mouth at bedtime daily.   ? nitroglycerin (NITROSTAT) 0.4 mg tablet Place one tablet under tongue every 5 minutes as needed for Chest Pain. Max of 3 tablets, call 911.   ? nystatin (MYCOSTATIN) 100,000 unit/g topical cream Apply  topically to affected area twice daily.   ? nystatin (MYCOSTATIN) 100,000 unit/g topical ointment Apply to affected areas TID   ? nystatin (NYSTOP) 100,000 unit/g topical powder Apply  topically to affected area four times daily.   ? pantoprazole DR (PROTONIX) 40 mg tablet Take one tablet by mouth twice daily.   ? predniSONE (DELTASONE) 10 mg tablet Take four tablets by mouth daily for 5 days, THEN two tablets daily for 5 days, THEN one tablet daily for 5 days, THEN one-half tablet daily for 5 days.   ? rivaroxaban (XARELTO) 20 mg tablet Take one tablet by mouth daily with dinner. Take with food.   ? sodium chloride (HYPER-SAL) 3.5 % inhalation solution Inhale 4 mL by mouth into the lungs twice daily. 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 tamsulosin (FLOMAX) 0.4 mg capsule TAKE 1 CAPSULE BY MOUTH ONCE DAILY (DO  NOT  CRUSH,  CHEW  OR  OPEN  CAPSULES  (TAKE  30  MINUTES  FOLLOWING  THE  SAME  MEAL  EACH  DAY).   ? traMADoL (ULTRAM) 50 mg tablet Take one tablet by mouth every 6 hours as needed for Pain.   ? venlafaxine XR (EFFEXOR XR) 75 mg capsule Take one capsule by mouth daily. Take with food.     There were no vitals filed for this visit.    There is no height or weight on file to calculate BMI. Physical Exam  Constitutional:       Appearance: Normal appearance.   HENT:      Head: Normocephalic and atraumatic.   Pulmonary:      Effort: Pulmonary effort is normal.      Comments: Some accessory muscle use, wearing oxygen  Neurological:      General: No focal deficit present.      Mental Status: He is alert.   Psychiatric:         Mood and Affect: Mood normal.         Labwork reviewed:  Lab Results  Component Value Date/Time    HGBA1C 6.1 (H) 05/11/2021 12:46 PM    HGBA1C 6.7 (H) 04/04/2020 05:40 AM    HGBA1C 5.9 11/25/2019 02:45 PM    A1C 5.8 05/11/2021 12:00 AM    HGBPOC 14.3 04/08/2020 06:58 AM    HGBPOC 11.2 (L) 10/25/2012 10:58 AM    HGBPOC 12.9 (L) 10/25/2012 08:55 AM    MCALBR 10.8 05/11/2021 11:40 AM    TSH 5.53 (H) 05/30/2021 04:30 PM    FREET4R 0.9 05/30/2021 04:30 PM    CHOL 161 04/04/2020 05:40 AM    TRIG 152 (H) 04/04/2020 05:40 AM    HDL 53 04/04/2020 05:40 AM    LDL 87 04/04/2020 05:40 AM    NA 135 (L) 06/03/2021 04:50 AM    K 4.6 06/03/2021 04:50 AM    CL 100 06/03/2021 04:50 AM    CO2 28 06/03/2021 04:50 AM    GAP 7 06/03/2021 04:50 AM    BUN 19 06/03/2021 04:50 AM    CR 1.01 06/03/2021 04:50 AM    GLU 109 (H) 06/03/2021 04:50 AM    CA 8.9 06/03/2021 04:50 AM    PO4 2.3 05/22/2021 12:28 AM    ALBUMIN 2.9 (L) 06/01/2021 03:21 AM    TOTPROT 5.2 (L) 06/01/2021 03:21 AM    ALKPHOS 96 06/01/2021 03:21 AM    AST 26 06/01/2021 03:21 AM    ALT 13 06/01/2021 03:21 AM    TOTBILI 0.9 06/01/2021 03:21 AM    GFR 53 05/13/2021 12:00 AM    GFRAA >60 12/03/2019 04:51 AM    PSA 2.96 11/25/2018 12:00 AM              Assessment and Plan:    Chronic bronchitis exacerbation: Patient has been experiencing shortness of breath, coughing, and wheezing. The plan is to start a prednisone taper and prescribe Augmentin as an antibiotic. The patient is advised to monitor their symptoms and contact the doctor early next week for a follow-up.    Heart failure and fluid retention: Patient has a history of heart failure and is currently experiencing fluid retention. The plan is to continue treatment with metolazone and Bumex, and closely monitor the patient's labs. The patient is advised to take another metolazone before Bumex and update the doctor on their condition early next week.    This note was generated by [employer], an AI driven dictation service. I reviewed the note and provided corrections, but the possibility of errors and typos is still possible.     Total of 40 minutes were spent on the same day of the visit including preparing to see the patient, obtaining and/or reviewing separately obtained history, performing a medically appropriate examination and/or evaluation, counseling and educating the patient/family/caregiver, ordering medications, tests, or procedures, referring and communication with other health care professionals, documenting clinical information in the electronic or other health record, independently interpreting results and communicating results to the patient/family/caregiver, and care coordination.       Patient Instructions     Future Appointments   Date Time Provider Department Center   07/19/2021  1:50 PM Deveron Furlong, MD [facility] IM   07/28/2021 12:00 AM MAC REMOTE MONITORING MACREMOTEHRM CVM Procedur   08/09/2021 12:00 PM Manson Luckadoo, [name], MD [facility] IM   08/24/2021 10:30 AM CVM SN PACEMAKER SNHRM CVM Procedur   08/24/2021 11:00 AM Vanetta Shawl I, MD CVMSNCL CVM Exam   08/29/2021 12:00 AM MAC REMOTE MONITORING MACREMOTEHRM CVM Procedur   08/30/2021  3:00  PM Kathrin Penner [facility] Urology   09/29/2021 12:00 AM MAC REMOTE MONITORING MACREMOTEHRM CVM Procedur   10/26/2021  9:00 AM PF LAB SCHEDULE A PULMFN1 None   10/26/2021 10:20 AM Deveron Furlong, MD MP[facility] IM   10/31/2021 12:00 AM MAC REMOTE MONITORING MACREMOTEHRM CVM Procedur   12/01/2021 12:00 AM MAC REMOTE MONITORING MACREMOTEHRM CVM Procedur   01/02/2022 12:00 AM MAC REMOTE MONITORING MACREMOTEHRM CVM Procedur   01/24/2022  1:45 PM Donnelly Stager, MD [facility] CVM Exam   02/02/2022 12:00 AM MAC REMOTE MONITORING MACREMOTEHRM CVM Procedur   03/06/2022 12:00 AM MAC REMOTE MONITORING MACREMOTEHRM CVM Procedur   04/06/2022 12:00 AM MAC REMOTE MONITORING MACREMOTEHRM CVM Procedur   05/08/2022 12:00 AM MAC REMOTE MONITORING MACREMOTEHRM CVM Procedur   06/08/2022 12:00 AM MAC REMOTE MONITORING MACREMOTEHRM CVM Procedur       If you are on Mychart, you will now be able to read your clinic note from me.  My hope is that this will improve your engagement and insight into your health care and improve the accuracy of your medical record.  If you find inaccuracies, I apologize.  Please bring any concerns or inaccuracies to your next appointment.  Please remember that medical language and documentation is very different from how you and I speak and our dictation software is not perfect.    General Instructions:  ? How to reach me:   Please send a MyChart message to the General Medicine clinic or call Wynona Canes at [phone].    ? How to get a medication refill:  Please use the MyChart Refill request or contact your pharmacy directly to request medication refills. Please allow 48 hours.     ? How to receive your test results:  If you have signed up for MyChart, you will receive your test results and messages from me this way.  Otherwise, you will get a phone call or letter.   If you are expecting results and have not heard from my office within 2 weeks of your testing, please send a MyChart message or call my office.     ? Scheduling:  Our Scheduling phone number is [phone].  Same Day appointments are usually available. Please ask for an annual or yearly physical appointment if it has been over 1 year since our last appointment.  ? Support groups for many chronic illnesses are available through [facility]: [email or web address] or [phone].            No follow-ups on file.

## 2021-07-01 ENCOUNTER — Encounter: Admit: 2021-07-01 | Discharge: 2021-07-01 | Payer: MEDICARE

## 2021-07-01 DIAGNOSIS — J42 Unspecified chronic bronchitis: Secondary | ICD-10-CM

## 2021-07-01 NOTE — Telephone Encounter
Routing to Dr. Comfort to advise     Mikella Linsley, RN

## 2021-07-04 ENCOUNTER — Encounter: Admit: 2021-07-04 | Discharge: 2021-07-04 | Payer: MEDICARE

## 2021-07-04 DIAGNOSIS — J4550 Severe persistent asthma, uncomplicated: Secondary | ICD-10-CM

## 2021-07-04 DIAGNOSIS — R399 Unspecified symptoms and signs involving the genitourinary system: Secondary | ICD-10-CM

## 2021-07-04 MED ORDER — MONTELUKAST 10 MG PO TAB
ORAL_TABLET | ORAL | 0 refills | 90.00000 days | Status: DC
Start: 2021-07-04 — End: 2021-07-04

## 2021-07-04 MED ORDER — MONTELUKAST 10 MG PO TAB
10 mg | ORAL_TABLET | Freq: Every evening | ORAL | 11 refills | 90.00000 days | Status: AC
Start: 2021-07-04 — End: ?

## 2021-07-04 MED ORDER — TAMSULOSIN 0.4 MG PO CAP
ORAL_CAPSULE | ORAL | 0 refills | 90.00000 days | Status: AC
Start: 2021-07-04 — End: ?

## 2021-07-04 NOTE — Telephone Encounter
Montelukast refill request received. Medication refilled per protocol.

## 2021-07-04 NOTE — Telephone Encounter
Routing to Dr. Comfort as FYI    Latara Micheli, RN

## 2021-07-04 NOTE — Progress Notes
[name], Ruben Bien, [place], BSN; [name], Ruben [place]; [name], Ruben  Pt's wife will call back to see if that time slot works for them.       ?   Previous Messages    ?  ----- Message -----   From: Blenda Mounts, BSN   Sent: 06/28/2021 ?11:16 AM CDT   To: [facility] Scheduling   Subject: RE: rescehdule [facility] with YMR ? ? ? ? ? ? ? ?     Can we just offer next available for [facility] clinic with Dr. Betti Cruz? 09/30/21 at the 11 AM slot? thanks   ----- Message -----   From: Rodell Perna   Sent: 06/21/2021 ?12:35 PM CDT   To: Blenda Mounts, BSN, [facility] Scheduling   Subject: RE: rescehdule [facility] with YMR ? ? ? ? ? ? ? ?     Just called patient back he had me call his wife phone she said that time is not option for her she said NO ?call her back with a later time   ----- Message -----   From: Blenda Mounts, BSN   Sent: 06/21/2021 ?11:33 AM CDT   To: [facility] Scheduling   Subject: RE: rescehdule [facility] with YMR ? ? ? ? ? ? ? ?     Can we try again?   ----- Message -----   From: Rodell Perna   Sent: 06/13/2021 ?11:49 AM CDT   To: Blenda Mounts, BSN, [facility] Scheduling   Subject: RE: rescehdule [facility] with YMR ? ? ? ? ? ? ? ?     PATIENT PICKED UP SAID NOTHING CALLED TWICE   ----- Message -----   From: Blenda Mounts, BSN   Sent: 06/13/2021 ?11:09 AM CDT   To: [facility] Scheduling   Subject: rescehdule [facility] with YMR ? ? ? ? ? ? ? ? ? ?     Hi! Please schedule with Betti Cruz at [facility] clinic. Can offer 08/19/2021 with Dr. Betti Cruz at 9 am in OP?   ----- Message -----   From: Fleet Contras, RN   Sent: 05/30/2021 ?10:23 PM CDT   To: Blenda Mounts, BSN   Subject: [facility] OV on 04/17 ? ? ? ? ? ? ? ? ? ? ? ? ? ?     This patient is scheduled for [facility] clinic visit with YMR on 04/17.   He has been hospitalized since 03/31.   I don't see a plan as of today documented for d/c timeline.     Wanted to give you a heads up - in case you had someone that needed to be seen sooner in [facility] clinic OR if patient was still going to be hospitalized.     Thanks

## 2021-07-08 ENCOUNTER — Encounter: Admit: 2021-07-08 | Discharge: 2021-07-08 | Payer: MEDICARE

## 2021-07-08 NOTE — Telephone Encounter
Routing to Dr. Crecencio Mc to advise on the multivitamin with iron if patient should be taking or not.    Thanks,  Etta Grandchild, RN

## 2021-07-11 ENCOUNTER — Encounter: Admit: 2021-07-11 | Discharge: 2021-07-11 | Payer: MEDICARE

## 2021-07-11 ENCOUNTER — Ambulatory Visit: Admit: 2021-07-11 | Discharge: 2021-07-11 | Payer: MEDICARE

## 2021-07-11 DIAGNOSIS — I5043 Acute on chronic combined systolic (congestive) and diastolic (congestive) heart failure: Secondary | ICD-10-CM

## 2021-07-11 DIAGNOSIS — G4733 Obstructive sleep apnea (adult) (pediatric): Secondary | ICD-10-CM

## 2021-07-11 DIAGNOSIS — I4891 Unspecified atrial fibrillation: Secondary | ICD-10-CM

## 2021-07-11 DIAGNOSIS — I2510 Atherosclerotic heart disease of native coronary artery without angina pectoris: Secondary | ICD-10-CM

## 2021-07-11 DIAGNOSIS — M961 Postlaminectomy syndrome, not elsewhere classified: Secondary | ICD-10-CM

## 2021-07-11 DIAGNOSIS — R053 Chronic cough: Secondary | ICD-10-CM

## 2021-07-11 DIAGNOSIS — E119 Type 2 diabetes mellitus without complications: Secondary | ICD-10-CM

## 2021-07-11 DIAGNOSIS — Z8679 Personal history of other diseases of the circulatory system: Secondary | ICD-10-CM

## 2021-07-11 DIAGNOSIS — I714 AAA (abdominal aortic aneurysm) (HCC): Secondary | ICD-10-CM

## 2021-07-11 DIAGNOSIS — E782 Mixed hyperlipidemia: Secondary | ICD-10-CM

## 2021-07-11 DIAGNOSIS — Z9981 Dependence on supplemental oxygen: Secondary | ICD-10-CM

## 2021-07-11 DIAGNOSIS — K219 Gastro-esophageal reflux disease without esophagitis: Secondary | ICD-10-CM

## 2021-07-11 DIAGNOSIS — I10 Essential (primary) hypertension: Secondary | ICD-10-CM

## 2021-07-11 DIAGNOSIS — I5042 Chronic combined systolic (congestive) and diastolic (congestive) heart failure: Secondary | ICD-10-CM

## 2021-07-11 DIAGNOSIS — N183 CKD (chronic kidney disease) stage 3, GFR 30-59 ml/min (HCC): Secondary | ICD-10-CM

## 2021-07-11 DIAGNOSIS — L03116 Cellulitis of left lower limb: Secondary | ICD-10-CM

## 2021-07-11 DIAGNOSIS — M954 Acquired deformity of chest and rib: Secondary | ICD-10-CM

## 2021-07-11 DIAGNOSIS — I255 Ischemic cardiomyopathy: Secondary | ICD-10-CM

## 2021-07-11 DIAGNOSIS — I429 Cardiomyopathy, unspecified: Secondary | ICD-10-CM

## 2021-07-11 DIAGNOSIS — N401 Enlarged prostate with lower urinary tract symptoms: Secondary | ICD-10-CM

## 2021-07-11 DIAGNOSIS — J302 Other seasonal allergic rhinitis: Secondary | ICD-10-CM

## 2021-07-11 DIAGNOSIS — R609 Edema, unspecified: Secondary | ICD-10-CM

## 2021-07-11 DIAGNOSIS — J45909 Unspecified asthma, uncomplicated: Secondary | ICD-10-CM

## 2021-07-11 DIAGNOSIS — Z8614 Personal history of Methicillin resistant Staphylococcus aureus infection: Secondary | ICD-10-CM

## 2021-07-11 DIAGNOSIS — J479 Bronchiectasis, uncomplicated: Secondary | ICD-10-CM

## 2021-07-11 DIAGNOSIS — U071 Pneumonia due to COVID-19 virus: Secondary | ICD-10-CM

## 2021-07-11 DIAGNOSIS — J449 Chronic obstructive pulmonary disease, unspecified: Secondary | ICD-10-CM

## 2021-07-11 DIAGNOSIS — R0600 Dyspnea, unspecified: Secondary | ICD-10-CM

## 2021-07-11 DIAGNOSIS — J189 Pneumonia, unspecified organism: Secondary | ICD-10-CM

## 2021-07-11 DIAGNOSIS — I509 Heart failure, unspecified: Secondary | ICD-10-CM

## 2021-07-11 DIAGNOSIS — D509 Iron deficiency anemia, unspecified: Secondary | ICD-10-CM

## 2021-07-11 DIAGNOSIS — Z95828 Presence of other vascular implants and grafts: Secondary | ICD-10-CM

## 2021-07-11 LAB — CBC
HEMATOCRIT: 33 % — ABNORMAL LOW (ref 40–50)
HEMOGLOBIN: 11 g/dL — ABNORMAL LOW (ref 13.5–16.5)
MCH: 33 pg (ref 26–34)
MCHC: 33 g/dL (ref 32.0–36.0)
MCV: 100 FL — ABNORMAL HIGH (ref 80–100)
PLATELET COUNT: 271 K/UL (ref 150–400)
RBC COUNT: 3.3 M/UL — ABNORMAL LOW (ref 4.4–5.5)
RDW: 20 % — ABNORMAL HIGH (ref >60)
WBC COUNT: 9.9 K/UL — ABNORMAL LOW (ref 4.5–11.0)

## 2021-07-11 LAB — BASIC METABOLIC PANEL
POTASSIUM: 4.5 MMOL/L (ref 3.5–5.1)
SODIUM: 139 MMOL/L (ref 137–147)

## 2021-07-11 MED ORDER — NITROGLYCERIN 0.4 MG SL SUBL
.4 mg | ORAL_TABLET | SUBLINGUAL | 1 refills | 9.00000 days | Status: AC | PRN
Start: 2021-07-11 — End: ?

## 2021-07-11 NOTE — Progress Notes
Date of Service: 07/11/2021    [name] Welborn Bottino is a 81 y.o. male.       HPI     I had the pleasure of seeing Ruben Wood, who is accompanied by his wife [name] today in [place] [facility] clinic for  heart failure follow-up.        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       He had last seen Norman Clay, APRN for a posthospital follow-up, he was being evaluated for Watchman procedure but was not a candidate due to being high risk for anesthesia.  At last office visit she had noted that he had a PCI to the LAD in April and had been off Plavix for a few days, she had instructed him to take 300 mg of Plavix once in addition to 81 mg aspirin and then continue Plavix 75 mg daily.      He had last seen Dr. Sherryll Burger on 3/15, requested 64-month follow-up.I had received the Cardiomems report today from Lenore Cordia RN, his PAD was last reported on 4/20 due to back pain.    Today his wife provided an update on [name], she tells me that during his last hospital stay at [facility] he had required 2 units of blood, she tells me when he was discharged he went to [facility], he felt like he was discharged too early from [facility] as he had a temperature of 100.9, he tells me that his temperature had increased to 106.9, he had rhinovirus and it sounds like a UTI.  She tells me that while he was at [facility] he was evaluated by PT, OT and speech therapy.  He is now living at home, he is scheduled to have PT visit him tomorrow.  [name] tells me that she had talked to Dr. Crecencio Mc by a telehealth appointment and was prescribed prednisone taper, he is down to 1 pill once a day.  In addition he was started on Augmentin, his London Pepper and Marcelline Deist were stopped (had UTI in February).  Today Ed tells me that his breathing is okay, the coughing has reduced while on prednisone but he anticipates that we will likely go back up once his prednisone is weaned off.  He has been using [employer] as well as Robitussin-DM with honey that is helped as well.  He denies any fever or chills.  He denies chest pain or awareness of palpitations.  He did have a V-fib arrest as noted on his recent device interrogation.       Vitals:    07/11/21 1303 07/11/21 1305   BP: 117/55    BP Source: Arm, Left Upper    Pulse: 84    Resp: 18    SpO2: 97%    O2 Liter Flow:  4 Lpm   PainSc: Seven Seven   Weight: 112.5 kg (248 lb) 112.5 kg (248 lb)   Height: 180.3 cm (5' 11) 180.3 cm (5' 11)     Body mass index is 34.59 kg/m?[name]     Past Medical History  Patient Active Problem List    Diagnosis Date Noted   ? Complex care coordination 05/04/2017     Priority: High  Class: Acute   ? Ventricular fibrillation (HCC) 05/23/2021   ? Moderate mitral regurgitation 05/23/2021   ? Sacroiliitis (HCC) 04/26/2021   ? ICD (implantable cardioverter-defibrillator) discharge 11/24/2020     11/24/20 Successful Dual-chamber cardiac resynchronization therapy (CRT) defibrillator generator replacement (Abbott) by Dr. Milas Kocher       ? Iron deficiency anemia 09/30/2020   ? Peripheral vascular disease (HCC) 06/22/2020   ? Epistaxis 05/14/2020     Was a problem after starting anticoagulation 04/2020, but this has been an ongoing issue for him. On O2 via NC at baseline.         ? Gout 04/03/2020   ? Dyspnea 04/02/2020   ? 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 Chronic bronchitis (HCC) 11/25/2019   ? Chronic bronchitis with acute exacerbation (HCC) 12/05/2018   ? 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 Type 2 diabetes mellitus with hyperglycemia, without long-term current use of insulin (HCC) 03/12/2018   ? Atrial fibrillation (HCC) 03/12/2018   ? Orthostatic hypotension 12/28/2017   ? Chronic respiratory failure with hypercapnia (HCC) 11/16/2017   ? Depression 11/15/2017   ? OAB (overactive bladder)      Urinary frequency, urgency, urge urinary incontinence (UUI), nocturia, nocturnal enuresis.  -- trial Mirabegron 25 mg --> improved, but persistent sx's.  -- trial Mirabegron 50 mg.     ? Urinary incontinence, urge      See OAB A&P note.     ? Bronchiectasis with acute lower respiratory infection (HCC) 05/10/2017   ? 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     ? Benign prostatic hyperplasia (BPH) 03/01/2017     TRUS Prostate (03/01/2017): Prostate volume = 38.3 mL.  D/c'd Tamsulosin d/t lack of symptom improvement.     ? Enrolled in chronic care management 02/28/2017   ? History of elevated prostate specific antigen (PSA)      PNBx (03/01/2017): (L) high-grade prostatic intraepithelial neoplasia (HG PIN), 1/6 cores; [name], PA-C.     ? Spondylolisthesis, lumbar region 12/13/2016   ? Lumbar post-laminectomy syndrome      L4-5     ? Spinal stenosis of lumbosacral region 09/20/2016   ? Spondylolisthesis of lumbosacral region 09/20/2016   ? Osteoarthritis of spine with radiculopathy, lumbosacral region 09/20/2016   ? 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 He had a normal response to pneumococca vaccination; he had 17/23 positive serotypes. He has normal tetanus toxoid Ab and CH50.  His T&B cell panel revealed a mildly low B-cell count at 68 right after a time of acute illness.  He had positive diphtheria antibody.    Likely multifactorial with significant comorbidities including frequent steroid use for bronchiectasis exacerbations, frequent infections, and possibly a nutritional component (low total protein).    Previously on doxycycline once daily for recurrent infections, which he initially felt had been tremendously helpful, but he has had a couple episodes of what may be pneumonia in early 2022 when he was off.     Plan:  - Restart doxycycline 100 mg daily.  - Recommend getting the Pneumovax immunization next time he is in clinic in person and will need repeat pneumococcal antibody levels 1 month afterwards once he has had resolution of  his COVID issues.          ? Moderate persistent asthma with acute exacerbation 02/25/2016     He has had asthma since sometime in his 20-30s. He gets cough, chest tightness, wheezing, shortness of breath that is year round with worsening in the Spring and Fall.   Triggers: URI, hot/cold air. Additionally he has been diagnosed with bronchiectasis and emphysema; his PFTs suggest both obstructive and restrictive disease and asthma is unlikely to be the sole cause of his dyspnea.    He has negative IgE immunocaps to aeroallergens which makes the possibility of allergic asthma highly unlikely. His IgE level was 17 and he had negative ANCAs, MPO and PR3.     Repeatedly being treated with steroids now for flares.  Also on hypertonic saline nebs, DuoNebs, budesnoide/Brovana via neb, and Singulair as well as aggressive pulmonary clearance with vest and Aerobika.        ? 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 He is not having issues with urticaria.    - We recommend IgE immunocaps to aeroallergens.      ? Recurrent infections 02/25/2016     Recurrent sinopulmonary infections requiring antibiotics 5-10 times per year.   Cellulitis and non-healing wounds.   Many underlying illnesses predisposing him to these infections in addition to intermittent hypogammaglobulinemia associated with active infections and systemic steroids.       ? S/P left pulmonary artery pressure sensor implant placement (CardioMEMs)  02/02/2016     * Patient has CardioMEMs (Pulmonary Artery Pressure Sensor)* Please call Matthew Folks, CardioMEMs Program Coordinator [phone] or page Heart Failure Rounding Team if patient is admitted or presents to ED*  03/30/15 implant     ? Ischemic cardiomyopathy 01/12/2016   ? ICD (implantable cardioverter-defibrillator), biventricular, in situ 12/07/2015     11/24/20 Successful Dual-chamber cardiac resynchronization therapy (CRT) defibrillator generator replacement (Abbott) by Dr. Milas Kocher     ? Hypercholesterolemia 11/25/2015   ? 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 Bronchiectasis without complication (HCC) 06/18/2015     Non-sputum producer.  Currently on Symbicort.  - Dr. [name] was able to get him a vest to wear at home for secretions and this has helped immensely with his secretions and breathing.      ? Cellulitis of left lower extremity 11/12/2014     10/2014: admitted with cellulitis, [place] negative for venous clot, MRI without osteomyelitis.      11/12/2014 In clinic today, still with cellulitis.  Per patient and his wife, the erythema may be extending.  Cultures from drainage in hospital with MSSA, but Blood Cx negative.  No e/o osteomyelitis.  My concern is that this antibiotic regimen is not adequately treating his cellulitis.  We will broaden coverage to get MRSA with doxycycline.  Patient and wife given strict call/return criteria.  I will see patient in 3-4 days for re-evaluation.  No fever today.   Plan:  Stop cefpodoxime and start doxycylcine    11/17/2014 Much better, no warmth and erythema minimal.    Plan: continue doxycyline for full 10 day course.        ? Osteoarthritis  of spine with radiculopathy, lumbar region 07/28/2014   ? Acute on chronic combined systolic and diastolic congestive heart failure, NYHA class 2 (HCC) 07/19/2014     11/04/14: EF 20%, severely dilated LV with grade 1 diastolic dysfunction.  11/12/2014 In clinic today, patient appears fairly well compensated.  He does have some LEE, but no significant rales (just some at bases), no JVD sitting upright, and no sx to suggest decompensation.  We will continue metoprolol, spironolactone, and bumex at current doses.    11/17/2014 Edema much improved in lower ext, Cr trending down on last labs.  Will recheck labs today.  Advised to weigh himself daily.  Rechecking BMP today to ensure not overdoing diuretics.     On metoprolol, spironolactone.  Will need to explore allergy to ARB more as patient may benefit from ACEI.      03/30/15 CardioMEMS implant by Dr. Chales Abrahams  1. Normal cardiac output and cardiac index.  2. Normal pulmonary pressures and normal pulmonary capillary wedge pressure.  3. Successful insertion of CardioMEMS pulmonary artery pressure sensor    05/04/2021 - ECHO:  The left ventricular systolic function is severely reduced. The visually estimated ejection fraction is 25%.  Severe LV dilatation, severe global hypokinesis.  The right ventricular size, wall thickness and systolic function are normal. ICD lead is present.  Moderate mitral valve regurgitation, mild tricuspid valve regurgitation.  The pulmonary artery pressure could not be estimated due to inadequate tricuspid regurgitation signal       ? 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      ? Stage 3 chronic kidney disease (HCC) 08/26/2013   ? Mixed dyslipidemia 08/26/2013     Hepatic Function    Lab Results   Component Value Date/Time    ALBUMIN 4.0 11/04/2014 12:26 AM    TOTAL PROTEIN 6.9 11/04/2014 12:26 AM    ALK PHOSPHATASE 61 11/04/2014 12:26 AM    Lab Results   Component Value Date/Time    AST (SGOT) 41* 11/04/2014 12:26 AM    ALT (SGPT) 19 11/04/2014 12:26 AM    TOTAL BILIRUBIN 1.4* 11/04/2014 12:26 AM        Lab Results   Component Value Date    CHOL 148 12/15/2013    TRIG 122 12/15/2013    HDL 51 12/15/2013    LDL 88 12/15/2013    VLDL 24 12/15/2013    NONHDLCHOL 97 12/15/2013       BP Readings from Last 3 Encounters:   11/12/14 129/80   11/09/14 111/44   11/04/14 146/65     Plan:   81 y.o. with known CAD with CTO of RCA and obtuse marginal branch with high grade stenosis (90%) in mid left circ.    Advised heart healthy diet and daily exercise.    Continue high intensity statin, atorvastatin       ? AAA (abdominal aortic aneurysm) (HCC) 10/15/2012     Duplex done at OSH in 2007 and 2008 ~ 4.1 cm    Duplex 10/15/12-AAA 7.3 cm AP x 7.3 cm       ? History of MRSA infection  10/14/2012   ? CAD (coronary artery disease), native coronary artery 08/15/2012     07/11/2002- Cath @ [facility] showed Severe double vessel disease(OMB of circumflex and distal circ)  inferior-basilar dyskinesis.                  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    09/12/12   Cath - [place]:  CTO of the right coronary artery, CTO of the obtuse marginal branch and high-grade stenosis of   90% in the mid left circumflex artery No significant stenosis in the left anterior descending artery or left main vessel     Failed attempt in opening 2nd obtuse marginal CTO as described above.    10/14/12: Unsuccessful attempt to open CTO of OMB and mid-CFX by Dr. Mackey Birchwood      04/02/17: Cath by Dr. Steward Ros:   4. Chronic total occlusion of the circumflex.  5. Chronic total occlusion of the right coronary artery, status post percutaneous coronary intervention with drug-eluting stent times 2.  6. Small distal right coronary artery perforation without echo or hemodynamic evidence of compromise.    06/29/17-cardiac catheterization by Dr. Steward Ros. One DES placed to chronic total occlusion of the circumflex artery. The RCA stent was patent.    05/23/2021 - Cardiac Catheterization:  Severe distal left main calcified stenosis confirmed with abnormal resting full-cycle ratio and abnormal intravascular ultrasound.  70% ostial stenosis and a jailed 1st marginal artery, which is jailed by a circumflex artery stent.  Normal left ventricular filling pressures and central aortic pressure.    05/27/2021 - Cardiac Catheterization:  Successful percutaneous coronary intervention of the left main coronary artery into the proximal left anterior descending artery with a 4.0 X 18 mm Xience Skypoint DES post-dilated with 5.0 NC.  We used Impella CP for hemodynamic support.  Shockwave lithotripsy with a 4.0 mm C2 IVL catheter, and intravascular ultrasound were utilized for the procedure.  Balloon angioplasty of the proximal left circumflex artery with a 2.5 mm balloon.  The minimum stent area of the proximal left anterior descending artery was 12.8 mm, distal left main was 16.5 mm, and the ostial left main was 17.4 sq mm.     ? OSA on CPAP 08/13/2012     CPAP at  DME: [facility]    Split night:  08/14/2017  AHI 43.2  REM n/a  Time below 88 %: 98 mins  Lowest sat: 83%     CPAP 10 cm h20 effective      ? COPD (chronic obstructive pulmonary disease) (HCC) 08/13/2012     04/28/13: PFTs with more restrictive pattern with FEV1 50%, FEV1/FVC 89%, DLCO 65%.    12/27/13: PFTs with FEV1 82%, FEV1/FVC 113%, and DLCO 77%     ? 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  Discussed the importance of diet.  Exercise as tolerated.        ? Essential hypertension 08/13/2012   ? Chest wall deformity 07/09/2012     Chest wall reconstruction on 07/09/12  Lung herniation- bio bridge  Review of Systems   Constitutional: Positive for malaise/fatigue.   HENT: Negative.    Eyes: Negative.    Cardiovascular: Negative.    Respiratory: Negative.    Endocrine: Negative.    Hematologic/Lymphatic: Negative.    Skin: Negative.    Musculoskeletal: Negative.    Gastrointestinal: Negative.    Genitourinary: Negative.    Neurological: Negative.    Psychiatric/Behavioral: Negative.    Allergic/Immunologic: Negative.        Physical Exam  General Appearance: In NAD, obese  Neck Veins: normal JVP, neck veins are not distended; no HJR   Chest Inspection: chest is normal in appearance   Respiratory Effort: breathing comfortably, no respiratory distress   Auscultation/Percussion: lungs clear to auscultation, no rales, rhonchi or wheezing wearing oxygen at 4 L nasal cannula  Cardiac Rhythm: regular rhythm and normal rate   Cardiac Auscultation: S1, S2 normal, no rub, no definite S3  or S4   Murmurs: no murmur   Peripheral Circulation: normal peripheral circulation   Pedal Pulses: normal symmetric pedal pulses   Lower Extremity Edema: Trace lower extremity edema, venous discoloration, there is a dressing on the left mid shin  Abdominal Exam: soft, non-tender, no obvious masses, bowel sounds normal   Gait & Station: Seated in a wheelchair  Orientation: oriented to person, place and time   Affect & Mood: appropriate and sustained affect   Language and Memory: patient responsive and seems to comprehend information   Neurologic Exam: neurological assessment grossly intact   Vital signs were reviewed    ? Cardiovascular Studies 05/21/2021 The left ventricle is severely dilated. Eccentric hypertrophy. The left ventricular systolic function is severely reduced. The ejection fraction by Simpson's biplane method is 25%. There are segmental wall motion abnormalities, as described below.  ? The right ventricular size is normal. The right ventricular systolic function is normal.  ? No pericardial effusion.  ? Compared with study dated 05/04/2021, no significant change in LV systolic function is noted. Contrast image definition is improved on today's study revealing segmental wall motion abnormalities, as described below.          Cardiovascular Health Factors  Vitals BP Readings from Last 3 Encounters:   07/11/21 117/55   06/29/21 124/56   06/03/21 135/54     Wt Readings from Last 3 Encounters:   07/11/21 112.5 kg (248 lb)   06/29/21 118.7 kg (261 lb 9.6 oz)   06/03/21 115.2 kg (253 lb 15.5 oz)     BMI Readings from Last 3 Encounters:   07/11/21 34.59 kg/m?   06/29/21 39.78 kg/m?   06/03/21 35.42 kg/m?      Smoking Social History     Tobacco Use   Smoking Status Former   ? Packs/day: 2.00   ? Years: 40.00   ? Pack years: 80.00   ? Types: Cigarettes   ? Quit date: 07/09/1998   ? Years since quitting: 23.0   Smokeless Tobacco Never   Vaping Use   ? Vaping Use: Never used      Lipid Profile Cholesterol   Date Value Ref Range Status   04/04/2020 161 <200 MG/DL Final     HDL   Date Value Ref Range Status   04/04/2020 53 >40 MG/DL Final     LDL   Date Value Ref Range Status   04/04/2020 87 <100 mg/dL Final     Triglycerides   Date Value Ref Range Status   04/04/2020 152 (H) <150 MG/DL Final  Blood Sugar Hemoglobin A1C   Date Value Ref Range Status   05/11/2021 6.1 (H) 4.0 - 5.7 % Final     Comment:     The ADA recommends that most patients with type 1 and type 2 diabetes maintain   an A1c level <7%.       Glucose   Date Value Ref Range Status 06/03/2021 109 (H) 70 - 100 MG/DL Final   81/19/1478 295 (H) 70 - 100 MG/DL Final   62/13/0865 784 (H) 70 - 100 MG/DL Final     Glucose Fasting   Date Value Ref Range Status   12/15/2014 111  Final     Glucose, POC   Date Value Ref Range Status   06/03/2021 126 (H) 70 - 100 MG/DL Final   69/62/9528 413 (H) 70 - 100 MG/DL Final   24/40/1027 253 (H) 70 - 100 MG/DL Final          Problems Addressed Today  Encounter Diagnoses   Name Primary?   ? Essential hypertension Yes   ? Coronary artery disease involving native coronary artery of native heart without angina pectoris    ? Ischemic cardiomyopathy    ? Acute on chronic combined systolic and diastolic congestive heart failure, NYHA class 2 (HCC)        Assessment and Plan     ?     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    The patient today describes NYHA Functional Class 3 stage C symptoms.    The home diuretic is bumex 3 mg twice daily and metolazone 2.5 mg PRN.   Patient appears euvolemic on physical exam today   Estimated dry weight is based off of Cardiomems readings, he tells me that his CardioMEMS reading today was 12, they have had problems with him getting a reading as he had back pain.     I reviewed most recent labs and most recent cardiac testing with patient and family.  Shared medical decision making involves eliciting patient and/or family preferences, education and explaining risks and benefits of management options.  HF Therapy:   GDMT Current Dose Changes made at visit   BB     ACEI/ARB/ARNI     Aldosterone antagonist     SGLT2i  Patient denies UTI symptoms, instructed to let us know if this happens.    Hydralazine/Nitrate      Ivabradine     Cardiac rehab        ?CRT-D in situ  - Placed 11/25/2015 by Dr. Betti Cruz  -Last device interrogation on 06/20/2021 shows battery at 86%, atrial lead signal amplitude suboptimal at 0.7 mV  ?  Paroxysmal AFib/Flutter, history of V-fib arrest treated with 1 shock on 06/13/2021  - pt currently not on a/c they had discussed Watchman procedure in the past however he was too high risk for cardiac anesthesia I discussed with  Dr. Sherryll Burger regarding if/when to resume Xarelto, he said to hold off for now.  - CHADs2VASc score 5 (HF,HTN, age, DM2)  >currently on amiodarone 400 mg daily.  Last TSH was 5.53 on 05/30/2021.  Last chest x-ray was on 06/03/2021  ?  CAD  -?LHC 06/29/17: DES to the CTO left circumflex obtuse marginal-2 by Dr. Alden Server. ?Patent stent in proximal RCA, 30% distal left main stenosis with circumferential calcium noted on the OCT-?Prior left cardiac catheterization 04/02/17 with DES x2 to the RCA w/ small distal?RCA perforation, trace?pericardial effusion;?known total occlusion of the  CX.??Left main and LAD have 30-40%?dz.?  >?Continue ASA 81 mg, atorvastatin 40 mg daily.  ?  PAD  - 10/7 ABI ?+ doppler BLE shows normal ABI to BLE  - 10/7 RLE arterial duplex shows 50-75% stenosis to mid R SFA with elevated velocity, turbulence and ratio  - 10/7 LLE arterial duplex shows 50-75% stenosis to L EIA, 50-75% stenosis to distal L CFA and proximal L SFA w/ elevated velocity and ratio along w/ monophasic waveform; focal stenosis unable to be ruled out  - 10/8 PV venous insufficiency BLE study shows no residual reflux   - pt established with Dr Elizabeth Palau; plan for conservative treatment with Jonne Ply and statin     CKD most recent chemistry on 4/14 showed stable creatinine of 1.01.  He went to lab today and had repeat labs that showed a stable potassium of 4.5 and creatinine 1.05 he had a repeat CBC today that showed improvement in hemoglobin to 11.2, hematocrit 33.2, no leukocytosis, white blood cell count was 9.9.  ?  Chronic bronchitis/COPD?/bronchiectasis  Asthma exacerbation   Prior COVID-19 infection with hospitalization  Oxygen dependent   - Pt follows with Dr. Lambert Mody from Pulmonary team   - 10/6 Per Pulmonary-continue prednisone 10 mg daily for 3 more days and continue home vest therapy as well as inhalers as prescribed.  ?  Morbid obesity:-?Body mass index is 34.59 kg/m?  OSA:> CPAP nightly       Follow up appointment: With Bunnie Philips, APRN on 6/20    I have personally documented the HPI, exam and medical decision making.  Patient education: I reviewed recent lab results and current medications, medication instructions, discussed heart failure signs & symptoms,  low  sodium diet, fluid restriction and daily weights.I have instructed the patient on the plan of care and they verbalize understanding of the plan. Please see AVS for full patient teaching. Patient advised to call our office if s/he has any problems, questions, worsening symptoms, or concerns prior to the next appointment.   Thanks for allowing me to see this nice patient. If I can be of additional assistance, please don't hesitate to contact me.     @MED @    Herby Abraham AGPCNP-BC, CHFN  Advanced Heart Failure APP  [facility]  Collaborating physician: Dr. Vanetta Shawl            Current Medications (including today's revisions)  ? acetaminophen (TYLENOL) 325 mg tablet Take two tablets by mouth every 6 hours as needed for Pain.   ? albuterol 0.083% (PROVENTIL) 2.5 mg /3 mL (0.083 %) nebulizer solution Inhale 3 mL solution by nebulizer as directed every 6 hours as needed for Wheezing or Shortness of Breath. Indications: asthma   ? albuterol sulfate (PROAIR HFA) 90 mcg/actuation HFA aerosol inhaler Inhale two puffs by mouth into the lungs every 4 hours as needed for Wheezing or Shortness of Breath.   ? allopurinoL (ZYLOPRIM) 300 mg tablet Take one tablet by mouth daily. take with food   ? amiodarone (PACERONE) 400 mg tablet Take one tablet by mouth daily. Take with food.   ? amitr-gabapen-emu oil 05-24-08% topical cream (COMPOUND) Apply to affected areas twice daily as needed.   ? arformoteroL (BROVANA) 15 mcg/2 mL nebulizer solution Inhale 2 mL solution by nebulizer as directed twice daily. Dx: J44.9   ? ascorbic acid-ascorbate sodium 500 mg chew Chew one tablet by mouth every morning.   ? atorvastatin (LIPITOR) 40 mg tablet Take 1 tablet  by mouth once daily   ? azelastine (ASTELIN) 137 mcg (0.1 %) nasal spray Apply two sprays to each nostril as directed twice daily. Use in each nostril as directed   ? benzonatate (TESSALON PERLES) 100 mg capsule Take one capsule by mouth every 8 hours.   ? budesonide (PULMICORT) 0.25 mg/2 mL nebulizer solution Inhale 2 mL solution by nebulizer as directed twice daily. Dx: J44.9   ? bumetanide (BUMEX) 2 mg tablet Take one tablet by mouth daily.   ? cholecalciferol (VITAMIN D-3) 1,000 units tablet Take one tablet by mouth daily.   ? clopiDOGreL (PLAVIX) 75 mg tablet Take one tablet by mouth daily.   ? diclofenac sodium (VOLTAREN) 0.1 % ophthalmic solution Place one drop into or around eye(s) four times daily.   ? dulaglutide (TRULICITY) 0.75 mg/0.5 mL injection pen Inject 0.5 mL under the skin every 7 days. Indications: type 2 diabetes mellitus   ? ferrous gluconate 324 mg (37.5 mg iron) tablet Take one tablet by mouth every 48 hours.   ? finasteride (PROSCAR) 5 mg tablet Take one tablet by mouth daily.   ? fish oil- omega 3-DHA/EPA 300/1,000 mg capsule Take one capsule by mouth daily.   ? flash glucose scanning reader (FREESTYLE LIBRE) reader Use as directed.   ? flash glucose sensor (FREESTYLE LIBRE 14 DAY SENSOR) sensor Type 2 diabetes  Indications: type 2 diabetes mellitus   ? gabapentin (NEURONTIN) 100 mg capsule Take four capsules by mouth twice daily.   ? guaiFENesin (ROBITUSSIN) 100 mg/5 mL oral solution Take 10 mL by mouth every 4 hours as needed.   ? HYDROcodone/acetaminophen (NORCO) 5/325 mg tablet Take one tablet by mouth every 6 hours as needed for Pain.   ? insulin pen needles (disposable) (BD ULTRA-FINE MINI PEN NEEDLE) 31 gauge x 3/16 pen needle Use with insulin pens   ? ipratropium bromide (ATROVENT) 21 mcg (0.03 %) nasal spray Apply two sprays to each nostril as directed every 12 hours.   ? magnesium oxide 400 mg magnesium capsule Take one capsule by mouth twice daily.   ? mepolizumab (NUCALA) 100 mg/mL injection pen Inject 100 mg under the skin every 28 days.   ? metOLazone (ZAROXOLYN) 2.5 mg tablet Take one tablet by mouth daily as needed. PER OUTPATIENT CARDIOLOGY PROVIDER AS NEEDED FOR FLUID RETENTION   ? Miscellaneous Medical Supply misc itted for LE gradual compression stockings at a medical supply store   ? Miscellaneous Medical Supply misc Vancomycin 1,500 mg IV every 24hours starting 06/25/20 x 4 days (14 total antibiotic days), please draw vancomycin level per your protocol at [facility].   ? Miscellaneous Medical Supply misc Rx: Please wrap legs with ACE bandage from toes as high up to the leg as possible bilaterally  Dx: Lymphedema   ? montelukast (SINGULAIR) 10 mg tablet Take one tablet by mouth at bedtime daily.   ? nitroglycerin (NITROSTAT) 0.4 mg tablet Place one tablet under tongue every 5 minutes as needed for Chest Pain. Max of 3 tablets, call 911.   ? nystatin (MYCOSTATIN) 100,000 unit/g topical cream Apply  topically to affected area twice daily.   ? nystatin (MYCOSTATIN) 100,000 unit/g topical ointment Apply to affected areas TID   ? nystatin (NYSTOP) 100,000 unit/g topical powder Apply  topically to affected area four times daily.   ? pantoprazole DR (PROTONIX) 40 mg tablet Take one tablet by mouth twice daily.   ? predniSONE (DELTASONE) 10 mg tablet Take four tablets by mouth  daily for 5 days, THEN two tablets daily for 5 days, THEN one tablet daily for 5 days, THEN one-half tablet daily for 5 days.   ? rivaroxaban (XARELTO) 20 mg tablet Take one tablet by mouth daily with dinner. Take with food.   ? sodium chloride (HYPER-SAL) 3.5 % inhalation solution Inhale 4 mL by mouth into the lungs twice daily. 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 tamsulosin (FLOMAX) 0.4 mg capsule TAKE 1 CAPSULE BY MOUTH ONCE DAILY (DO  NOT  CRUSH,  CHEW  OR  OPEN  CAPSULES  (TAKE  30  MINUTES  FOLLOWING  THE SAME  MEAL  EACH  DAY).   ? traMADoL (ULTRAM) 50 mg tablet Take one tablet by mouth every 6 hours as needed for Pain.   ? venlafaxine XR (EFFEXOR XR) 75 mg capsule Take one capsule by mouth daily. Take with food.     Total Time Today was 60 minutes in the following activities: Preparing to see the patient, Obtaining and/or reviewing separately obtained history, Performing a medically appropriate examination and/or evaluation, Counseling and educating the patient/family/caregiver, Ordering medications, tests, or procedures, Referring and communication with other health care professionals (when not separately reported), Documenting clinical information in the electronic or other health record, Independently interpreting results (not separately reported) and communicating results to the patient/family/caregiver and Care coordination (not separately reported) face to face [phone]

## 2021-07-11 NOTE — Telephone Encounter
Call to pt advise per Dr Dickie La no need to restart xarelto wife verb understanding, denies questions

## 2021-07-11 NOTE — Progress Notes
Daily CardioMEMS Readings    Goal PA Diastolic: 15 mmHg   PA Diastolic Thresholds: 12-17 mmHg  Notified Herby Abraham, APRN  Pt reports he is unable to submit reading due to back pain.

## 2021-07-12 ENCOUNTER — Encounter: Admit: 2021-07-12 | Discharge: 2021-07-12 | Payer: MEDICARE

## 2021-07-12 NOTE — Telephone Encounter
-----   Message from Fredricka Bonine, APRN-NP sent at 07/12/2021 12:07 PM CDT -----  Mr. Schulenburg's repeat CBC is markedly improved proved with hemoglobin increased to 11.2, hematocrit is 33.2.  His white blood cell count is normal.  His repeat chemistry shows stable potassium of 4.5 and creatinine is normal at 1.05 no changes in the plan of care, would you let patient and his wife know his good results?  Also please let them know that I had discussed with Dr. Sherryll Burger, he said we will not be resuming his Xarelto at this time.  Thank you, DM

## 2021-07-12 NOTE — Telephone Encounter
Called and spoke to [place], spouse [facility]) and provided her with the following recommendations per Annabelle Harman:    ----- Message from Fredricka Bonine, APRN-NP sent at 07/12/2021 12:07 PM CDT -----  Mr. Lusignan's repeat CBC is markedly improved proved with hemoglobin increased to 11.2, hematocrit is 33.2.  His white blood cell count is normal.  His repeat chemistry shows stable potassium of 4.5 and creatinine is normal at 1.05 no changes in the plan of care, would you let patient and his wife know his good results?  Also please let them know that I had discussed with Dr. Sherryll Burger, he said we will not be resuming his Xarelto at this time.  Thank you, DM    [name], wife verbalizes understanding of all of the above, voices much appreciation for call and denies any further questions, concerns or needs at this time.

## 2021-07-13 ENCOUNTER — Encounter: Admit: 2021-07-13 | Discharge: 2021-07-13 | Payer: MEDICARE

## 2021-07-13 MED FILL — BUDESONIDE 0.25 MG/2 ML IN NBSP: 0.25 mg/2 mL | 15 days supply | Qty: 60 | Fill #4 | Status: AC

## 2021-07-14 ENCOUNTER — Encounter: Admit: 2021-07-14 | Discharge: 2021-07-14 | Payer: MEDICARE

## 2021-07-14 DIAGNOSIS — K029 Dental caries, unspecified: Secondary | ICD-10-CM

## 2021-07-19 ENCOUNTER — Ambulatory Visit: Admit: 2021-07-19 | Discharge: 2021-07-19 | Payer: MEDICARE

## 2021-07-19 ENCOUNTER — Encounter: Admit: 2021-07-19 | Discharge: 2021-07-19 | Payer: MEDICARE

## 2021-07-19 DIAGNOSIS — I4891 Unspecified atrial fibrillation: Secondary | ICD-10-CM

## 2021-07-19 DIAGNOSIS — I5042 Chronic combined systolic (congestive) and diastolic (congestive) heart failure: Secondary | ICD-10-CM

## 2021-07-19 DIAGNOSIS — N401 Enlarged prostate with lower urinary tract symptoms: Secondary | ICD-10-CM

## 2021-07-19 DIAGNOSIS — K219 Gastro-esophageal reflux disease without esophagitis: Secondary | ICD-10-CM

## 2021-07-19 DIAGNOSIS — I2510 Atherosclerotic heart disease of native coronary artery without angina pectoris: Secondary | ICD-10-CM

## 2021-07-19 DIAGNOSIS — R609 Edema, unspecified: Secondary | ICD-10-CM

## 2021-07-19 DIAGNOSIS — U071 Pneumonia due to COVID-19 virus: Secondary | ICD-10-CM

## 2021-07-19 DIAGNOSIS — J449 Chronic obstructive pulmonary disease, unspecified: Secondary | ICD-10-CM

## 2021-07-19 DIAGNOSIS — Z95828 Presence of other vascular implants and grafts: Secondary | ICD-10-CM

## 2021-07-19 DIAGNOSIS — J479 Bronchiectasis, uncomplicated: Secondary | ICD-10-CM

## 2021-07-19 DIAGNOSIS — E119 Type 2 diabetes mellitus without complications: Secondary | ICD-10-CM

## 2021-07-19 DIAGNOSIS — R0600 Dyspnea, unspecified: Secondary | ICD-10-CM

## 2021-07-19 DIAGNOSIS — Z8679 Personal history of other diseases of the circulatory system: Secondary | ICD-10-CM

## 2021-07-19 DIAGNOSIS — I509 Heart failure, unspecified: Secondary | ICD-10-CM

## 2021-07-19 DIAGNOSIS — J45909 Unspecified asthma, uncomplicated: Secondary | ICD-10-CM

## 2021-07-19 DIAGNOSIS — D509 Iron deficiency anemia, unspecified: Secondary | ICD-10-CM

## 2021-07-19 DIAGNOSIS — M961 Postlaminectomy syndrome, not elsewhere classified: Secondary | ICD-10-CM

## 2021-07-19 DIAGNOSIS — N183 CKD (chronic kidney disease) stage 3, GFR 30-59 ml/min (HCC): Secondary | ICD-10-CM

## 2021-07-19 DIAGNOSIS — I714 AAA (abdominal aortic aneurysm) (HCC): Secondary | ICD-10-CM

## 2021-07-19 DIAGNOSIS — M954 Acquired deformity of chest and rib: Secondary | ICD-10-CM

## 2021-07-19 DIAGNOSIS — Z9981 Dependence on supplemental oxygen: Secondary | ICD-10-CM

## 2021-07-19 DIAGNOSIS — E782 Mixed hyperlipidemia: Secondary | ICD-10-CM

## 2021-07-19 DIAGNOSIS — Z8614 Personal history of Methicillin resistant Staphylococcus aureus infection: Secondary | ICD-10-CM

## 2021-07-19 DIAGNOSIS — L03116 Cellulitis of left lower limb: Secondary | ICD-10-CM

## 2021-07-19 DIAGNOSIS — J189 Pneumonia, unspecified organism: Secondary | ICD-10-CM

## 2021-07-19 DIAGNOSIS — I429 Cardiomyopathy, unspecified: Secondary | ICD-10-CM

## 2021-07-19 DIAGNOSIS — G4733 Obstructive sleep apnea (adult) (pediatric): Secondary | ICD-10-CM

## 2021-07-19 DIAGNOSIS — I10 Essential (primary) hypertension: Secondary | ICD-10-CM

## 2021-07-19 DIAGNOSIS — R053 Chronic cough: Secondary | ICD-10-CM

## 2021-07-19 DIAGNOSIS — J302 Other seasonal allergic rhinitis: Secondary | ICD-10-CM

## 2021-07-19 MED ORDER — MEPOLIZUMAB 100 MG SC SOLR
100 mg | Freq: Once | SUBCUTANEOUS | 0 refills | Status: CP
Start: 2021-07-19 — End: ?
  Administered 2021-07-19: 20:00:00 100 mg via SUBCUTANEOUS

## 2021-07-19 NOTE — Telephone Encounter
Routing to Dr. Comfort to advise  Ruben Paulson, RN

## 2021-07-19 NOTE — Patient Instructions
My nurse is Wilder Kurowski, RN.  She can be reached at [phone].     Please contact my nurse with any signs and symptoms of worsening productive cough with thick secretions, blood in sputum, chest tightness/pain, shortness of breath, fever, chills, night sweats, or any questions or concerns.     For refills on medications, please have your pharmacy fax a refill authorization request form to our office at [phone]. Please allow at least 3 business days for refill requests.     For urgent issues after business hours/weekends/holidays call [phone] and request for the pulmonary fellow to be paged.

## 2021-07-20 ENCOUNTER — Encounter: Admit: 2021-07-20 | Discharge: 2021-07-20 | Payer: MEDICARE

## 2021-07-20 ENCOUNTER — Ambulatory Visit: Admit: 2021-07-20 | Discharge: 2021-07-20 | Payer: MEDICARE

## 2021-07-20 ENCOUNTER — Inpatient Hospital Stay: Admit: 2021-07-20 | Discharge: 2021-07-20 | Payer: MEDICARE

## 2021-07-20 ENCOUNTER — Inpatient Hospital Stay: Admit: 2021-07-20 | Payer: MEDICARE

## 2021-07-20 DIAGNOSIS — J302 Other seasonal allergic rhinitis: Secondary | ICD-10-CM

## 2021-07-20 DIAGNOSIS — N183 CKD (chronic kidney disease) stage 3, GFR 30-59 ml/min (HCC): Secondary | ICD-10-CM

## 2021-07-20 DIAGNOSIS — I5042 Chronic combined systolic (congestive) and diastolic (congestive) heart failure: Secondary | ICD-10-CM

## 2021-07-20 DIAGNOSIS — R053 Chronic cough: Secondary | ICD-10-CM

## 2021-07-20 DIAGNOSIS — I2510 Atherosclerotic heart disease of native coronary artery without angina pectoris: Secondary | ICD-10-CM

## 2021-07-20 DIAGNOSIS — Z8614 Personal history of Methicillin resistant Staphylococcus aureus infection: Secondary | ICD-10-CM

## 2021-07-20 DIAGNOSIS — J45909 Unspecified asthma, uncomplicated: Secondary | ICD-10-CM

## 2021-07-20 DIAGNOSIS — L03116 Cellulitis of left lower limb: Secondary | ICD-10-CM

## 2021-07-20 DIAGNOSIS — I429 Cardiomyopathy, unspecified: Secondary | ICD-10-CM

## 2021-07-20 DIAGNOSIS — J479 Bronchiectasis, uncomplicated: Secondary | ICD-10-CM

## 2021-07-20 DIAGNOSIS — R0600 Dyspnea, unspecified: Secondary | ICD-10-CM

## 2021-07-20 DIAGNOSIS — D509 Iron deficiency anemia, unspecified: Secondary | ICD-10-CM

## 2021-07-20 DIAGNOSIS — G4733 Obstructive sleep apnea (adult) (pediatric): Secondary | ICD-10-CM

## 2021-07-20 DIAGNOSIS — E782 Mixed hyperlipidemia: Secondary | ICD-10-CM

## 2021-07-20 DIAGNOSIS — K219 Gastro-esophageal reflux disease without esophagitis: Secondary | ICD-10-CM

## 2021-07-20 DIAGNOSIS — Z9981 Dependence on supplemental oxygen: Secondary | ICD-10-CM

## 2021-07-20 DIAGNOSIS — N401 Enlarged prostate with lower urinary tract symptoms: Secondary | ICD-10-CM

## 2021-07-20 DIAGNOSIS — I4891 Unspecified atrial fibrillation: Secondary | ICD-10-CM

## 2021-07-20 DIAGNOSIS — U071 Pneumonia due to COVID-19 virus: Secondary | ICD-10-CM

## 2021-07-20 DIAGNOSIS — I509 Heart failure, unspecified: Secondary | ICD-10-CM

## 2021-07-20 DIAGNOSIS — J189 Pneumonia, unspecified organism: Secondary | ICD-10-CM

## 2021-07-20 DIAGNOSIS — E119 Type 2 diabetes mellitus without complications: Secondary | ICD-10-CM

## 2021-07-20 DIAGNOSIS — I10 Essential (primary) hypertension: Secondary | ICD-10-CM

## 2021-07-20 DIAGNOSIS — Z8679 Personal history of other diseases of the circulatory system: Secondary | ICD-10-CM

## 2021-07-20 DIAGNOSIS — R609 Edema, unspecified: Secondary | ICD-10-CM

## 2021-07-20 DIAGNOSIS — M961 Postlaminectomy syndrome, not elsewhere classified: Secondary | ICD-10-CM

## 2021-07-20 DIAGNOSIS — I714 AAA (abdominal aortic aneurysm) (HCC): Secondary | ICD-10-CM

## 2021-07-20 DIAGNOSIS — I5023 Acute on chronic systolic (congestive) heart failure: Secondary | ICD-10-CM

## 2021-07-20 DIAGNOSIS — M954 Acquired deformity of chest and rib: Secondary | ICD-10-CM

## 2021-07-20 DIAGNOSIS — Z95828 Presence of other vascular implants and grafts: Secondary | ICD-10-CM

## 2021-07-20 DIAGNOSIS — I872 Venous insufficiency (chronic) (peripheral): Secondary | ICD-10-CM

## 2021-07-20 DIAGNOSIS — J449 Chronic obstructive pulmonary disease, unspecified: Secondary | ICD-10-CM

## 2021-07-20 DIAGNOSIS — J9612 Chronic respiratory failure with hypercapnia: Secondary | ICD-10-CM

## 2021-07-20 LAB — POC GLUCOSE: POC GLUCOSE: 145 mg/dL — ABNORMAL HIGH (ref 70–100)

## 2021-07-20 LAB — IRON + BINDING CAPACITY + %SAT+ FERRITIN
% SATURATION: 13 % — ABNORMAL LOW (ref 28–42)
FERRITIN: 204 ng/mL (ref 30–300)
IRON BINDING: 247 ug/dL — ABNORMAL LOW (ref 270–380)
IRON: 32 ug/dL — ABNORMAL LOW (ref 50–185)

## 2021-07-20 LAB — PHOSPHORUS: PHOSPHORUS: 3.8 mg/dL — ABNORMAL LOW (ref 2.0–4.5)

## 2021-07-20 LAB — COMPREHENSIVE METABOLIC PANEL
ALBUMIN: 3.3 g/dL — ABNORMAL LOW (ref 3.5–5.0)
ANION GAP: 12 K/UL (ref 3–12)
BLD UREA NITROGEN: 52 mg/dL — ABNORMAL HIGH (ref 7–25)
CALCIUM: 9.1 mg/dL (ref 8.5–10.6)
CREATININE: 1.3 mg/dL — ABNORMAL HIGH (ref 0.4–1.24)
GLUCOSE,PANEL: 126 mg/dL — ABNORMAL HIGH (ref 70–100)
SODIUM: 139 MMOL/L — ABNORMAL LOW (ref 137–147)
TOTAL BILIRUBIN: 0.6 mg/dL — ABNORMAL LOW (ref 0.3–1.2)
TOTAL PROTEIN: 6.2 g/dL (ref 6.0–8.0)

## 2021-07-20 LAB — MAGNESIUM: MAGNESIUM: 1.7 mg/dL — ABNORMAL LOW (ref 1.6–2.6)

## 2021-07-20 LAB — CBC AND DIFF
ABSOLUTE BASO COUNT: 0 K/UL (ref 0–0.20)
WBC COUNT: 7.2 K/UL (ref 4.5–11.0)

## 2021-07-20 LAB — BNP (B-TYPE NATRIURETIC PEPTI): BNP: 286 pg/mL — ABNORMAL HIGH (ref 0–100)

## 2021-07-20 LAB — PTT (APTT): PTT: 28 s — ABNORMAL LOW (ref 24.0–36.5)

## 2021-07-20 LAB — GRAM STAIN

## 2021-07-20 LAB — VITAMIN B12: VITAMIN B12: 187 pg/mL — ABNORMAL LOW (ref >60)

## 2021-07-20 LAB — TSH WITH FREE T4 REFLEX: TSH: 2.7 uU/mL — ABNORMAL LOW (ref 0.35–5.00)

## 2021-07-20 LAB — C REACTIVE PROTEIN (CRP): C-REACTIVE PROTEIN: 10 mg/dL — ABNORMAL HIGH (ref <1.0)

## 2021-07-20 LAB — SED RATE: ESR: 80 mm/h — ABNORMAL HIGH (ref 0–20)

## 2021-07-20 LAB — PROTIME INR (PT): PROTIME: 11 s — ABNORMAL LOW (ref 9.5–14.2)

## 2021-07-20 MED ORDER — SODIUM CHLORIDE 3 % IN NEBU
Freq: Two times a day (BID) | RESPIRATORY_TRACT | 0 refills | Status: AC
Start: 2021-07-20 — End: ?
  Administered 2021-07-21 – 2021-08-04 (×28): 4 mL via RESPIRATORY_TRACT

## 2021-07-20 MED ORDER — AMIODARONE 200 MG PO TAB
400 mg | Freq: Every day | ORAL | 0 refills | Status: AC
Start: 2021-07-20 — End: ?
  Administered 2021-07-21 – 2021-08-04 (×16): 400 mg via ORAL

## 2021-07-20 MED ORDER — CLOPIDOGREL 75 MG PO TAB
75 mg | Freq: Every day | ORAL | 0 refills | Status: AC
Start: 2021-07-20 — End: ?
  Administered 2021-07-21 – 2021-08-04 (×16): 75 mg via ORAL

## 2021-07-20 MED ORDER — ONDANSETRON HCL (PF) 4 MG/2 ML IJ SOLN
4 mg | INTRAVENOUS | 0 refills | Status: AC | PRN
Start: 2021-07-20 — End: ?

## 2021-07-20 MED ORDER — ARFORMOTEROL 15 MCG/2 ML IN NEBU
15 ug | Freq: Two times a day (BID) | RESPIRATORY_TRACT | 0 refills | Status: AC
Start: 2021-07-20 — End: ?
  Administered 2021-07-22 – 2021-08-04 (×25): 15 ug via RESPIRATORY_TRACT

## 2021-07-20 MED ORDER — METOPROLOL TARTRATE 25 MG PO TAB
12.5 mg | Freq: Two times a day (BID) | ORAL | 0 refills | Status: AC
Start: 2021-07-20 — End: ?
  Administered 2021-07-21 – 2021-07-22 (×4): 12.5 mg via ORAL

## 2021-07-20 MED ORDER — TAMSULOSIN 0.4 MG PO CAP
.4 mg | Freq: Every evening | ORAL | 0 refills | Status: AC
Start: 2021-07-20 — End: ?
  Administered 2021-07-21 – 2021-08-04 (×15): 0.4 mg via ORAL

## 2021-07-20 MED ORDER — SENNOSIDES-DOCUSATE SODIUM 8.6-50 MG PO TAB
1 | Freq: Every day | ORAL | 0 refills | Status: AC | PRN
Start: 2021-07-20 — End: ?
  Administered 2021-07-22 – 2021-07-29 (×4): 1 via ORAL

## 2021-07-20 MED ORDER — MONTELUKAST 10 MG PO TAB
10 mg | Freq: Every evening | ORAL | 0 refills | Status: AC
Start: 2021-07-20 — End: ?
  Administered 2021-07-21 – 2021-08-04 (×15): 10 mg via ORAL

## 2021-07-20 MED ORDER — ALLOPURINOL 300 MG PO TAB
300 mg | Freq: Every day | ORAL | 0 refills | Status: AC
Start: 2021-07-20 — End: ?
  Administered 2021-07-21 – 2021-07-24 (×5): 300 mg via ORAL

## 2021-07-20 MED ORDER — VENLAFAXINE 75 MG PO CP24
75 mg | Freq: Every day | ORAL | 0 refills | Status: AC
Start: 2021-07-20 — End: ?
  Administered 2021-07-21 – 2021-08-04 (×14): 75 mg via ORAL

## 2021-07-20 MED ORDER — MELATONIN 5 MG PO TAB
5 mg | Freq: Every evening | ORAL | 0 refills | Status: AC | PRN
Start: 2021-07-20 — End: ?
  Administered 2021-07-21 – 2021-08-01 (×7): 5 mg via ORAL

## 2021-07-20 MED ORDER — BUDESONIDE 0.25 MG/2 ML IN NBSP
.25 mg | Freq: Two times a day (BID) | RESPIRATORY_TRACT | 0 refills | Status: AC
Start: 2021-07-20 — End: ?
  Administered 2021-07-21 – 2021-08-04 (×29): 0.25 mg via RESPIRATORY_TRACT

## 2021-07-20 MED ORDER — PANTOPRAZOLE 40 MG PO TBEC
40 mg | Freq: Two times a day (BID) | ORAL | 0 refills | Status: AC
Start: 2021-07-20 — End: ?
  Administered 2021-07-21 – 2021-08-04 (×30): 40 mg via ORAL

## 2021-07-20 MED ORDER — ATORVASTATIN 40 MG PO TAB
40 mg | Freq: Every day | ORAL | 0 refills | Status: AC
Start: 2021-07-20 — End: ?
  Administered 2021-07-21 – 2021-08-04 (×16): 40 mg via ORAL

## 2021-07-20 MED ORDER — POLYETHYLENE GLYCOL 3350 17 GRAM PO PWPK
1 | Freq: Every day | ORAL | 0 refills | Status: AC | PRN
Start: 2021-07-20 — End: ?
  Administered 2021-07-23 – 2021-07-29 (×4): 17 g via ORAL

## 2021-07-20 MED ORDER — DEXTROSE 50 % IN WATER (D50W) IV SYRG
12.5-25 g | INTRAVENOUS | 0 refills | Status: AC | PRN
Start: 2021-07-20 — End: ?

## 2021-07-20 MED ORDER — VANCOMYCIN PHARMACY TO MANAGE
1 | 0 refills | Status: CN
Start: 2021-07-20 — End: ?

## 2021-07-20 MED ORDER — VANCOMYCIN IVPB
INTRAVENOUS | 0 refills | Status: CN
Start: 2021-07-20 — End: ?

## 2021-07-20 MED ORDER — BUMETANIDE 0.25 MG/ML IJ SOLN
4 mg | Freq: Two times a day (BID) | INTRAVENOUS | 0 refills | Status: AC
Start: 2021-07-20 — End: ?
  Administered 2021-07-21 (×2): 4 mg via INTRAVENOUS

## 2021-07-20 MED ORDER — ENOXAPARIN 40 MG/0.4 ML SC SYRG
40 mg | Freq: Every day | SUBCUTANEOUS | 0 refills | Status: AC
Start: 2021-07-20 — End: ?
  Administered 2021-07-21 – 2021-07-25 (×4): 40 mg via SUBCUTANEOUS

## 2021-07-20 MED ORDER — ALBUTEROL SULFATE 90 MCG/ACTUATION IN HFAA
2 | RESPIRATORY_TRACT | 0 refills | Status: AC | PRN
Start: 2021-07-20 — End: ?

## 2021-07-20 MED ORDER — ONDANSETRON 4 MG PO TBDI
4 mg | ORAL | 0 refills | Status: AC | PRN
Start: 2021-07-20 — End: ?
  Administered 2021-07-24: 03:00:00 4 mg via ORAL

## 2021-07-20 MED ORDER — GABAPENTIN 400 MG PO CAP
400 mg | Freq: Two times a day (BID) | ORAL | 0 refills | Status: AC
Start: 2021-07-20 — End: ?
  Administered 2021-07-21 – 2021-07-26 (×17): 400 mg via ORAL

## 2021-07-20 MED ORDER — ASPIRIN 81 MG PO CHEW
81 mg | Freq: Every day | ORAL | 0 refills | Status: AC
Start: 2021-07-20 — End: ?
  Administered 2021-07-21 – 2021-08-04 (×16): 81 mg via ORAL

## 2021-07-20 MED ORDER — NITROGLYCERIN 0.4 MG SL SUBL
.4 mg | SUBLINGUAL | 0 refills | Status: AC | PRN
Start: 2021-07-20 — End: ?

## 2021-07-20 MED ORDER — FINASTERIDE 5 MG PO TAB
5 mg | Freq: Every day | ORAL | 0 refills | Status: AC
Start: 2021-07-20 — End: ?
  Administered 2021-07-21 – 2021-08-04 (×16): 5 mg via ORAL

## 2021-07-20 MED ORDER — INSULIN ASPART 100 UNIT/ML SC FLEXPEN
0-6 [IU] | Freq: Before meals | SUBCUTANEOUS | 0 refills | Status: AC
Start: 2021-07-20 — End: ?

## 2021-07-20 NOTE — Progress Notes
Date of Service: 07/20/2021    Ruben Yelle. is a 81 y.o. male. DOB: 1940-03-28   MRN#: [id number]    Subjective:   Follow Up (Left leg pain)          History of Present Illness    History of present illness:  Ruben Camelo. is 81 y.o. Ruben Wood patient who presents to clinic for left lower extremity cellulitis.    He has a history of type 2 diabetes, severe asthma with bronchiectasis, peripheral vascular disease, hyperlipidemia, ischemic cardiomyopathy with ICD implantation,?severely impaired LV ejection fraction,?hypertension, coronary artery disease, stage III CKD, hypergammaglobulinemia and sleep apnea.    He is accompanied by his wife Ruben Wood.He underwent high-risk PCI on 05/27/21 with DES to the L main into the proximal LAD as well as balloon angioplasty of the ostial left circumflex artery.?He had one week of triple therapy, then was transitioned back to just his PTA xarelto and plavix. His stay was complicated by sepsis, likely related to pneumonia. He was treated with Zosyn then discharged on Augmentin.     This past weekend he went to his emergency room for a nonhealing    We will be there. He has PT/OT and speech this morning    5/22 258    265 (up 7 LBS)    4L of ox    Of xerlto  ASA 81mg    .   The swelling in legs have decreased , but I feel the wound and infection needs to be addressed.   ?He has severe venous stasis disease, so it is difficult to tease out on MyChart whether he is having true HF or not. We will work to get him in the clinic either way to see.        Heart Failure Hospitalization Summary:  Echocardiogram?05/21/21  ? The left ventricle is severely dilated. Eccentric hypertrophy. The left ventricular systolic function is severely reduced. The ejection fraction by Simpson's biplane method is 25%. There are segmental wall motion abnormalities, as described below.  ? The right ventricular size is normal. The right ventricular systolic function is normal.  ? No pericardial effusion.  ? Compared with study dated 05/04/2021, no significant change in LV systolic function is noted. Contrast image definition is improved on today's study revealing segmental wall motion abnormalities, as described below.  ?  LHC 05/27/21  1. Successful percutaneous coronary intervention of the left main coronary artery into the proximal left anterior descending artery with a 4.0 X 18 mm Xience Skypoint DES post-dilated with 5.0 NC. ?We used Impella CP for hemodynamic support. ?Shockwave lithotripsy with a 4.0 mm C2 IVL catheter, and intravascular ultrasound were utilized for the procedure.  2. Balloon angioplasty of the proximal left circumflex artery with a 2.5 mm balloon.  The minimum stent area of the proximal left anterior descending artery was 12.8 mm, distal left main was 16.5 mm, and the ostial left main was 17.4 sq mm.  Medical History:   Diagnosis Date   ? AAA (abdominal aortic aneurysm) (HCC) 10/15/2012   ? Asthma    ? Atrial fibrillation (HCC)    ? BPH with obstruction/lower urinary tract symptoms    ? Bronchiectasis (HCC)    ? CAD (coronary artery disease), native coronary artery 08/15/2012    07/11/2002- Cath @ [facility] showed Severe double vessel disease(OMB of circumflex and distal circ)  inferior-basilar dyskinesis.  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  09/12/12   Cath    ? Cardiomyopathy (HCC) 07/19/2014   ? Cellulitis of left lower extremity 11/12/2014    10/2014: admitted with cellulitis, [place] negative for venous clot, MRI without osteomyelitis.    11/12/2014 In clinic today, still with cellulitis.  Per patient and his wife, the erythema may be extending.  Cultures from drainage in hospital with MSSA, but Blood Cx negative.  No e/o osteomyelitis.  My concern is that this antibiotic regimen is not adequately treating his cellulitis.  We will broaden coverage to get MRSA with doxycycline.  Patient and wife given strict call/return criteria.  I will see patient in 3-4 days for re-evaluation.  No fever today.  Plan:  Stop cefpodoxime and start doxycylcine  11/17/2014 Much better, no warmth and erythema minimal.   Plan: continue doxycyline for full 10 day course.      ? Chest wall deformity 07/09/2012    Chest wall reconstruction on 07/09/12    ? CHF (congestive heart failure) (HCC)    ? 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 Chronic cough 08/29/2012   ? 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 COPD (chronic obstructive pulmonary disease) (HCC) 08/13/2012   ? Diabetes (HCC)    ? Dyspnea    ? Edema    ? GERD (gastroesophageal reflux disease) 08/13/2012   ? History of CHF (congestive heart failure) 08/13/2012   ? History of MRSA infection 10/14/2012   ? 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    ? Hypertension 08/13/2012   ? IDA (iron deficiency anemia)    ? 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 Morbid obesity (HCC) 08/13/2012   ? On supplemental oxygen therapy    ? OSA on CPAP 08/13/2012   ? Pneumonia 04/2012    [facility]   ? Pneumonia due to COVID-19 virus 12/05/2018   ? Seasonal allergic reaction        Social History     Socioeconomic History   ? Marital status: Married     Spouse name: [name]   ? Number of children: 0   Occupational History   ? Occupation: former Therapist, music: RETIRED     Comment: lives in a 82 year old house with wife   Tobacco Use   ? Smoking status: Former     Packs/day: 2.00     Years: 40.00     Pack years: 80.00     Types: Cigarettes     Quit date: 07/09/1998     Years since quitting: 23.0   ? Smokeless tobacco: Never   Vaping Use   ? Vaping Use: Never used   Substance and Sexual Activity   ? Alcohol use: Yes     Comment: occasion   ?  Drug use: Never   ? Sexual activity: Not Currently     Vaping/E-liquid Use   ? Vaping Use Never User      Vaping/E-liquid Substances   ? CBD No    ? Nicotine No    ? Other No    ? Flavored No    ? THC No    ? Unknown No                  ROS    Objective:     ? acetaminophen (TYLENOL) 325 mg tablet Take two tablets by mouth every 6 hours as needed for Pain.   ? albuterol 0.083% (PROVENTIL) 2.5 mg /3 mL (0.083 %) nebulizer solution Inhale 3 mL solution by nebulizer as directed every 6 hours as needed for Wheezing or Shortness of Breath. Indications: asthma   ? albuterol sulfate (PROAIR HFA) 90 mcg/actuation HFA aerosol inhaler Inhale two puffs by mouth into the lungs every 4 hours as needed for Wheezing or Shortness of Breath.   ? allopurinoL (ZYLOPRIM) 300 mg tablet Take one tablet by mouth daily. take with food   ? amiodarone (PACERONE) 400 mg tablet Take one tablet by mouth daily. Take with food.   ? amitr-gabapen-emu oil 05-24-08% topical cream (COMPOUND) Apply to affected areas twice daily as needed.   ? arformoteroL (BROVANA) 15 mcg/2 mL nebulizer solution Inhale 2 mL solution by nebulizer as directed twice daily. Dx: J44.9   ? ascorbic acid-ascorbate sodium 500 mg chew Chew one tablet by mouth every morning.   ? atorvastatin (LIPITOR) 40 mg tablet Take 1 tablet by mouth once daily   ? azelastine (ASTELIN) 137 mcg (0.1 %) nasal spray Apply two sprays to each nostril as directed twice daily. Use in each nostril as directed   ? benzonatate (TESSALON PERLES) 100 mg capsule Take one capsule by mouth every 8 hours.   ? budesonide (PULMICORT) 0.25 mg/2 mL nebulizer solution Inhale 2 mL solution by nebulizer as directed twice daily. Dx: J44.9   ? bumetanide (BUMEX) 2 mg tablet Take one tablet by mouth daily.   ? cholecalciferol (VITAMIN D-3) 1,000 units tablet Take one tablet by mouth daily.   ? clindamycin (CLEOCIN) 300 mg capsule Take one capsule by mouth four times daily.   ? clopiDOGreL (PLAVIX) 75 mg tablet Take one tablet by mouth daily.   ? diclofenac sodium (VOLTAREN) 0.1 % ophthalmic solution Place one drop into or around eye(s) four times daily.   ? dulaglutide (TRULICITY) 0.75 mg/0.5 mL injection pen Inject 0.5 mL under the skin every 7 days. Indications: type 2 diabetes mellitus   ? ferrous gluconate 324 mg (37.5 mg iron) tablet Take one tablet by mouth every 48 hours.   ? finasteride (PROSCAR) 5 mg tablet Take one tablet by mouth daily.   ? fish oil- omega 3-DHA/EPA 300/1,000 mg capsule Take one capsule by mouth daily.   ? flash glucose scanning reader (FREESTYLE LIBRE) reader Use as directed.   ? flash glucose sensor (FREESTYLE LIBRE 14 DAY SENSOR) sensor Type 2 diabetes  Indications: type 2 diabetes mellitus   ? gabapentin (NEURONTIN) 100 mg capsule Take four capsules by mouth twice daily.   ? guaiFENesin (ROBITUSSIN) 100 mg/5 mL oral solution Take 10 mL by mouth every 4 hours as needed.   ? HYDROcodone/acetaminophen (NORCO) 5/325 mg tablet Take one tablet by mouth every 6 hours as needed for Pain.   ? insulin pen needles (disposable) (BD ULTRA-FINE MINI PEN NEEDLE) 31 gauge  x 3/16 pen needle Use with insulin pens   ? ipratropium bromide (ATROVENT) 21 mcg (0.03 %) nasal spray Apply two sprays to each nostril as directed every 12 hours.   ? magnesium oxide 400 mg magnesium capsule Take one capsule by mouth twice daily.   ? mepolizumab (NUCALA) 100 mg/mL injection pen Inject 100 mg under the skin every 28 days.   ? metOLazone (ZAROXOLYN) 2.5 mg tablet Take one tablet by mouth daily as needed. PER OUTPATIENT CARDIOLOGY PROVIDER AS NEEDED FOR FLUID RETENTION   ? Miscellaneous Medical Supply misc itted for LE gradual compression stockings at a medical supply store   ? Miscellaneous Medical Supply misc Vancomycin 1,500 mg IV every 24hours starting 06/25/20 x 4 days (14 total antibiotic days), please draw vancomycin level per your protocol at [facility].   ? Miscellaneous Medical Supply misc Rx: Please wrap legs with ACE bandage from toes as high up to the leg as possible bilaterally  Dx: Lymphedema   ? montelukast (SINGULAIR) 10 mg tablet Take one tablet by mouth at bedtime daily.   ? nitroglycerin (NITROSTAT) 0.4 mg tablet Place one tablet under tongue every 5 minutes as needed for Chest Pain. Max of 3 tablets, call 911.   ? nystatin (MYCOSTATIN) 100,000 unit/g topical cream Apply  topically to affected area twice daily.   ? nystatin (MYCOSTATIN) 100,000 unit/g topical ointment Apply to affected areas TID   ? nystatin (NYSTOP) 100,000 unit/g topical powder Apply  topically to affected area four times daily.   ? pantoprazole DR (PROTONIX) 40 mg tablet Take one tablet by mouth twice daily.   ? predniSONE (DELTASONE) 10 mg tablet Take four tablets by mouth daily for 5 days, THEN two tablets daily for 5 days, THEN one tablet daily for 5 days, THEN one-half tablet daily for 5 days.   ? sodium chloride (HYPER-SAL) 3.5 % inhalation solution Inhale 4 mL by mouth into the lungs twice daily. 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 tamsulosin (FLOMAX) 0.4 mg capsule TAKE 1 CAPSULE BY MOUTH ONCE DAILY (DO  NOT  CRUSH,  CHEW  OR  OPEN  CAPSULES  (TAKE  30  MINUTES  FOLLOWING  THE  SAME  MEAL  EACH  DAY).   ? traMADoL (ULTRAM) 50 mg tablet Take one tablet by mouth every 6 hours as needed for Pain.   ? venlafaxine XR (EFFEXOR XR) 75 mg capsule Take one capsule by mouth daily. Take with food.     Vitals:    07/20/21 1601   BP: 111/63   BP Source: Arm, Right Upper   Pulse: 88   Resp: 16   SpO2: 96%  Comment: 4 liters of oxygen PainSc: Ten   Weight: 116.6 kg (257 lb)   Height: 180.3 cm (5' 10.98)     Body mass index is 35.86 kg/m?[name]     BP Readings from Last 5 Encounters:   07/20/21 111/63   07/19/21 128/55   07/11/21 117/55   06/29/21 124/56   06/03/21 135/54     Wt Readings from Last 5 Encounters:   07/20/21 116.6 kg (257 lb)   07/19/21 117 kg (257 lb 15 oz)   07/11/21 117 kg (258 lb)   06/29/21 118.7 kg (261 lb 9.6 oz)   06/03/21 115.2 kg (253 lb 15.5 oz)       Physical Exam  Cardiovascular:      Pulses: Normal pulses.      Heart sounds: No murmur heard.  No friction rub.   Pulmonary:      Effort: No respiratory distress.      Breath sounds: No stridor.      Comments: 4 L of oxygen  Abdominal:      General: Bowel sounds are normal. There is distension (mild generalized).   Musculoskeletal:         General: Swelling present.   Neurological:      Mental Status: He is alert.   Psychiatric:         Mood and Affect: Mood normal.         Behavior: Behavior normal.         Thought Content: Thought content normal.         Judgment: Judgment normal.             Left leg is erythematous, warm and extremely painful             Assessment and Plan:    Left lower extremity cellulitis  > Failed outpatient clindamycin  > Dr. Crecencio Mc came in during the visit to examine patient.  Culture taken.  > Agrees with inpatient IV therapy possibly vancomycin  > We will have him consult vascular because of his venous insufficiency  > Called report to AOD Dr.Digiacomo    Acute on chronic combined systolic and diastolic heart failure  >?He has had a 7 pound weight gain over the past week.  2+ pitting edema  > Has dual-chamber CRT defibrillator- by Dr. Milas Kocher  > 4/23?echo:?EF 25%  > Continue metolazone 2.5 mg daily as needed for fluid retention  >?Continue Bumex 2 mg twice daily  > Has allergy to ARB.  > Re-initiate cardiac rehab once he has recovered from his injuries.     Coronary artery disease  >?RCA with obtuse marginal branch with high-grade stenosis (90%)?and mid left circumflex.  > 4/23-DES to the L main into the proximal LAD as well as balloon angioplasty of the ostial left circumflex artery  >?Advised on heart healthy diet and exercise  >?Continue high intensity statin,?atorvastatin  > Continue clopidogrel 75 mg daily    Paroxysmal atrial fibrillation  >?Elevated CHADS-VASc score of at least 5 (age above 58, hypertension, coronary artery disease).  >??Continue rivaroxaban20 mg daily for now. Due?to his epistaxis, not a candidate for long-term anticoagulation  >?He is a candidate for left atrial appendage closure  > Continue amiodarone 400 mg daily  > Keep appoint with cardiology on 08/09/2021  ?  Type 2 diabetes:?Goal A1c less than 7.0%  >?A1c 4 months ago 6.1%  >?Continue Jardiance 25 mg daily  > Continue Trulicity 0.75 mg weekly.  Was decreased from 1.5 mg weekly a week and half ago due to diarrhea   ?  Moderate Persistent asthma and COPD:  >?Continue oxygen supplementation?4/NC  > Continue montelukast 10 mg daily  >?Continue Astelin  >?Continue budesonide nebulizers twice daily  >?Continue Brovana 1 nebulizer twice daily  >?Continue ProAir and albuterol nebulizers as needed  >Continue mepolizumab (Nucala) injections Q 4 weeks    Depression  >? Continue duloxetine back to 60 mg daily. ?    Keep appoint with Dr. Crecencio Mc on 08/09/2021  Cindee Lame. Berenice Bouton. was seen today for follow up.    Diagnoses and all orders for this visit:    Left leg cellulitis  -     AMB REFERRAL TO VASCULAR CENTER    Venous (peripheral) insufficiency  -     AMB REFERRAL TO VASCULAR CENTER  Chronic respiratory failure with hypercapnia (HCC)    Peripheral edema  -     AMB REFERRAL TO VASCULAR CENTER        Orders Placed This Encounter   ? VASCULAR CENTER     Future Appointments   Date Time Provider Department Center   07/20/2021  5:00 PM Budd Palmer, APRN-NP [facility] IM   07/28/2021 12:00 AM MAC REMOTE MONITORING [facility] CVM Procedur   08/09/2021 12:00 PM Wood, Ruben Large, MD [facility] IM   08/09/2021  3:30 PM Sharol Given, Shela Commons, APRN-NP [facility] CVM Exam   08/24/2021 10:30 AM CVM SN PACEMAKER [facility] CVM Procedur   08/24/2021 11:00 AM Vanetta Shawl I, MD [facility] CVM Exam   08/29/2021 12:00 AM [facility]IT[facility] CVM Procedur   08/30/2021  3:00 PM Royce Macadamia III, PA-C [facility]   09/29/2021 12:00 AM [facility]OTE MONITORING [facility] CVM Procedur   10/26/2021  9:00 AM PF LAB SCHEDULE A [id number] None   10/26/2021 10:20 AM Deveron Furlong, MD [facility] IM   10/31/2021 12:00 AM [facility]OTE MONITOR[facility] CVM Procedur   12/01/2021 12:00 AM [facility] REMOTE MONITORING [facility] CVM Procedur   01/02/2022 12:00 AM [facility] REMOTE MONITORING [facility] CVM Procedur   01/24/2022  1:45 PM Donnelly Stager, MD [facility] CVM Exam   02/02/2022 12:00 AM [facility] REMOTE MONITORING [facility] CVM Procedur   03/06/2022 12:00 AM [facility] REMOTE MONITORING [facility] CVM Procedur   04/06/2022 12:00 AM [facility] REMOTE MONITORING [facility] CVM Procedur   05/08/2022 12:00 AM [facility]OTE [facility]ITORING [facility] CVM Procedur   06/08/2022 12:00 AM [facility]OTE [facility]ITORING [facility] CVM Procedur         Mahina Salatino A Christa Fasig, APRN-NP             Total of 40 minutes were spent on the same day of the visit including preparing to see the patient, obtaining and/or reviewing separately obtained history, performing a medically appropriate examination and/or evaluation, counseling and educating the patient/family/caregiver, ordering medications, tests, or procedures, referring and communication with other health care professionals, documenting clinical information in the electronic or other health record, independently interpreting results and communicating results to the patient/family/caregiver, and care coordination. I used a dictation program when conducting this note. Due to this, there may be grammatical errors or inconsistencies. For clarification or questions, please contact me.

## 2021-07-20 NOTE — Telephone Encounter
Telephoen encounter/Care Coordination note    Received phone call from Ruben Milo APRN in IM clinic requesting admission for pt Ruben Wood for LLE cellulitis, workup of possible vascular insufficiency and suspected acute on chronic HFrEF exacerbation (up 7lb from dry weight and appears mildly volume overloaded). LVEF 25%. IM recommending IV Vanc as pt has failed outpatient Clindamycin (prescribed by outside provider). On 4L of O2 @ baseline.    Pt accepted to IM service/tele bed.      Ruben Piggs, MD  Internal Medicine  [facility]  [phone]    Ruben Wood is the preferred method of communication.   Please use the [facility] First Call for all patient-related communications.   Personal Voaltes and pagers are not answered at all hours.

## 2021-07-21 ENCOUNTER — Encounter: Admit: 2021-07-21 | Discharge: 2021-07-21 | Payer: MEDICARE

## 2021-07-21 ENCOUNTER — Inpatient Hospital Stay: Admit: 2021-07-21 | Discharge: 2021-07-21 | Payer: MEDICARE

## 2021-07-21 ENCOUNTER — Inpatient Hospital Stay: Admit: 2021-07-21 | Discharge: 2021-07-20 | Payer: MEDICARE

## 2021-07-21 MED ADMIN — BUMETANIDE 2 MG PO TAB [9311]: 3 mg | ORAL | @ 23:00:00 | NDC 50268013211

## 2021-07-21 MED ADMIN — POTASSIUM CHLORIDE 20 MEQ PO TBTQ [35943]: 60 meq | ORAL | @ 14:00:00 | Stop: 2021-07-21 | NDC 00832532511

## 2021-07-21 MED ADMIN — IRON SUCROSE 100 MG IRON/5 ML IV SOLN GROUP [280019]: 300 mg | INTRAVENOUS | @ 20:00:00 | Stop: 2021-07-24 | NDC 00517234001

## 2021-07-21 MED ADMIN — POTASSIUM CHLORIDE IN WATER 10 MEQ/50 ML IV PGBK [11075]: 10 meq | INTRAVENOUS | @ 17:00:00 | Stop: 2021-07-21 | NDC 00338070541

## 2021-07-21 MED ADMIN — VANCOMYCIN 1.5G IN 0.9% NACL IVPB (BATCHED) [213737]: 1500 mg | INTRAVENOUS | @ 23:00:00 | NDC 54029433309

## 2021-07-21 MED ADMIN — POTASSIUM CHLORIDE IN WATER 10 MEQ/50 ML IV PGBK [11075]: 10 meq | INTRAVENOUS | @ 15:00:00 | Stop: 2021-07-21 | NDC 00338070541

## 2021-07-21 MED ADMIN — B COMP NO3-FOLIC-C-BIOTIN-ZINC 1-60-300-12.5 MG-MG-MCG-MG PO TAB [173534]: 1 | ORAL | @ 23:00:00 | NDC 59528031701

## 2021-07-21 MED ADMIN — ACETAMINOPHEN 325 MG PO TAB [101]: 650 mg | ORAL | @ 05:00:00 | NDC 00904677361

## 2021-07-21 MED ADMIN — MAGNESIUM SULFATE IN D5W 1 GRAM/100 ML IV PGBK [166578]: 1 g | INTRAVENOUS | @ 20:00:00 | Stop: 2021-07-22 | NDC 00338170940

## 2021-07-21 MED ADMIN — MAGNESIUM SULFATE IN D5W 1 GRAM/100 ML IV PGBK [166578]: 1 g | INTRAVENOUS | @ 15:00:00 | Stop: 2021-07-21 | NDC 00338170940

## 2021-07-21 MED ADMIN — ALBUTEROL SULFATE 2.5 MG /3 ML (0.083 %) IN NEBU [250]: 2.5 mg | RESPIRATORY_TRACT | @ 06:00:00 | Stop: 2021-07-21 | NDC 60687039579

## 2021-07-21 MED ADMIN — ACETAMINOPHEN 325 MG PO TAB [101]: 650 mg | ORAL | @ 17:00:00 | NDC 00904677361

## 2021-07-21 MED ADMIN — PERFLUTREN LIPID MICROSPHERES 1.1 MG/ML IV SUSP [79178]: 2 mL | INTRAVENOUS | @ 19:00:00 | Stop: 2021-07-21 | NDC 11994001116

## 2021-07-21 MED ADMIN — SODIUM CHLORIDE 0.9 % IV SOLP [27838]: 300 mg | INTRAVENOUS | @ 20:00:00 | Stop: 2021-07-24 | NDC 00338004902

## 2021-07-21 MED ADMIN — BUMETANIDE 1 MG PO TAB [9310]: 3 mg | ORAL | @ 23:00:00 | NDC 00904701606

## 2021-07-21 NOTE — Progress Notes
Vascular Access Team consulted to obtain lab specimen.    Ultrasound Used: Yes    How Many Attempts: 1    Location of Unsuccessful Attempts: na    At 2155 approximately 35 ml of blood obtained from newly inserted RFA PIV and 39ml from LAC venipuncture. Patient tolerated the procedure well. Specimen labeled and sent to lab.

## 2021-07-21 NOTE — H&P (View-Only)
Name:  Ruben Wood.   Today's Date:  07/20/2021  Admission Date: 07/20/2021  LOS: 0 days                     Assessment and Plan     Principal Problem:    Acute on chronic HFrEF (heart failure with reduced ejection fraction) (HCC)  Active Problems:    Cellulitis of left lower extremity      This is an 81 year old male with a medical history of diabetes mellitus type 2, asthma/COPD, bronchiectasis, peripheral vascular disease, hypertension, hyperlipidemia, heart failure with reduced ejection fraction (25%), CAD status post PCI, ICM, ICD in situ, hypergammaglobulinemia, OSA, CKD, obesity.    He presents as a direct admission from internal medicine clinic with cellulitis and acute on chronic heart failure exacerbation.  When he presented to clinic, he was found to have 7 pound weight gain.    Acute on chronic heart failure with reduced ejection fraction (25%)  Ischemic cardiomyopathy  Dilated cardiomyopathy  CAD (left main disease) status post PCI  Paroxysmal atrial fibrillation (not on anticoagulation)  CRT-D in situ (2017-[employer])  CardioMEMS in situ (2017)  History of VT  AAA status post EVAR  -Follows with Dr. Dickie La for heart failure, Dr. Betti Cruz for EP  -TTE 04/2021: LVEF 25%, severe LV dilation, severe global hypokinesis.  Normal RV size and function.  Moderate MR, mild TR  -Limited TTE 05/21/21: Severe dilation of LV with LVEF 25%.  Segmental wall motion abnormalities noted.  -LHC 06/29/2017: CTO LCx with DES, patent stent to mid RCA  -LHC 05/28/21: PCI left main/LAD.  Required Impella support.  Balloon angioplasty left circumflex.  -Admitted in March 2023 with symptomatic VT, received shock from ICD.  Underwent high risk PCI to left main/LAD, balloon angioplasty ostial LCx    -CHA2DS2-VASc 6  -7 pound weight gain over the past 1 month, mild increase shortness of breath  -GDMT PTA: None   -SGLT2, discontinued due to UTI  -PTA diuretic Bumex 3 mg twice daily, metolazone 2.5 mg as needed    Plan:  > BNP, EKG  > Iron panel  > Bumex 4 mg IV twice daily, first dose now  > Goal net -1-2 L in 24  > Daily standing weights, strict I's/O  > TTE  > Device interrogation  > Heart failure consult in a.m., appreciate recommendations regarding heart failure management, antiarrhythmic, GDMT and antiplatelet/anticoagulation therapy  > Continue PTA amiodarone 400 mg, atorvastatin 40 mg, Plavix 75 mg daily   > We will initiate low-dose beta-blockade with metoprolol tartrate 12.5 twice daily   > will start ASA 81 mg for now in the setting of left main disease until cardiology evaluation 6/1    Concern for left lower extremity cellulitis  Bilateral lower extremity venous stasis  Peripheral artery disease of left lower extremity  -Lower arterial Doppler 11/2019: Normal bilateral ABI, reduced left toe brachial index (0.56).  Right toe brachial index not obtained.   -50-75% stenosis in the left EIA.  50-75% stenosis distal left CFA and proximal left SFA  -11/2019: Venous insufficiency study: No evidence of deep venous reflux, no evidence of great saphenous venous thrombosis or reflux.  Isolated reflux in left GCV in the proximal mid calf.  Right SSV chronically thrombosed.  Left SSV has isolated reflux in proximal portion and chronically thrombosed distal to that.  -Follows with Dr. Chales Abrahams for PAD, treated with medical management: Antiplatelet therapy and statin  -Treated with  a 5 day course of clindamycin    Plan:  > Follow-up cultures, Gram stain collected today in clinic  > Obtain blood cultures x2  > Obtain ESR/CRP  > Hold off on empiric abx at this time, if evidence of infection (leukocytosis, elevated inflammatory markers) would initiate vancomycin   -appearance of bilateral lower extremities that of venous stasis changes  > Lower extremity arterial duplex to evaluate for arterial insufficiency   -Consider vascular consultation in a.m. pending results    Diabetes mellitus type 2  Diabetic peripheral neuropathy  -PTA Trulicity 0.5 mg  -A1c 6.1%  > Low-dose correction factor  > PTA gabapentin    Chronic hypoxemic respiratory failure  Bronchiectasis  Asthma/COPD  -Follows with Dr. [name]  -4 L O2 requirement at baseline  > PTA inhalers/nebs (albuterol, after medical, budesonide, hypertonic saline), montelukast  > Chest x-ray to assess for volume overload  > Aerobika and CPT as needed    OSA  > CPAP    Obesity  -Body mass index is 36.84 kg/m?.    FEN: None, high goal replacement, cardiac with 2 L fluid restriction  CODE: Full  PPX: Lovenox  DISPO: Admit to medicine  PT/OT: Ordered    POC discussed with Dr. Reino Kent, DO  Internal Medicine, PGY-3  Available on [facility]  Pager [phone]        Subjective       CC: Both of my legs have been hurting for some time.      HPI:    Mr. Chauncey reports an approximate 2-week history of bilateral lower extremity swelling and associated pain.  He notes he was started on clindamycin for approximately 5 days without improvement in the bilateral lower leg pain, swelling and erythema.  He does note intermittent weeping of his bilateral lower extremity wounds.  He denies any frank purulent discharge.  He wonders if there is anything to do about his lower extremity edema.  He is currently taking Bumex 3 mg twice daily and as needed metolazone.  He does think that he has gained weight since his last visit with cardiology.  He is without any increased shortness of breath.  He denies any chest pain.    He thinks that he has had fever and chills, but when asked when he states back in January and in March when he was hospitalized at an outside hospital and [facility], respectively.  He is otherwise without acute complaint.     He is unsure of his current medications.  He notes that his wife manages this for him.  He is taking his diuretics.  He denies any recent dietary indiscretion.    He denies any recent bleeding-no melena, hematochezia or epistaxis.      Review of Systems   Constitutional: Positive for chills, fever and malaise/fatigue. Negative for diaphoresis.   HENT: Negative for sinus pain and sore throat.    Respiratory: Positive for cough (chronic). Negative for sputum production, shortness of breath and wheezing.    Cardiovascular: Positive for orthopnea, leg swelling and PND. Negative for chest pain, palpitations and claudication.   Gastrointestinal: Negative for abdominal pain, blood in stool, constipation, diarrhea, melena, nausea and vomiting.   Genitourinary: Negative for dysuria and urgency.   Musculoskeletal: Negative for myalgias.   Neurological: Negative for dizziness, tingling, speech change, focal weakness, seizures, weakness and headaches.   Psychiatric/Behavioral: Negative for depression. The patient is not nervous/anxious.  Medical History:   Diagnosis Date   ? AAA (abdominal aortic aneurysm) (HCC) 10/15/2012   ? Asthma    ? Atrial fibrillation (HCC)    ? BPH with obstruction/lower urinary tract symptoms    ? Bronchiectasis (HCC)    ? CAD (coronary artery disease), native coronary artery 08/15/2012    07/11/2002- Cath @ [facility] showed Severe double vessel disease(OMB of circumflex and distal circ)  inferior-basilar dyskinesis.                 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  09/12/12   Cath    ? Cardiomyopathy (HCC) 07/19/2014   ? Cellulitis of left lower extremity 11/12/2014    10/2014: admitted with cellulitis, [place] negative for venous clot, MRI without osteomyelitis.    11/12/2014 In clinic today, still with cellulitis.  Per patient and his wife, the erythema may be extending.  Cultures from drainage in hospital with MSSA, but Blood Cx negative.  No e/o osteomyelitis.  My concern is that this antibiotic regimen is not adequately treating his cellulitis.  We will broaden coverage to get MRSA with doxycycline.  Patient and wife given strict call/return criteria.  I will see patient in 3-4 days for re-evaluation.  No fever today.  Plan:  Stop cefpodoxime and start doxycylcine  11/17/2014 Much better, no warmth and erythema minimal.   Plan: continue doxycyline for full 10 day course.      ? Chest wall deformity 07/09/2012    Chest wall reconstruction on 07/09/12    ? CHF (congestive heart failure) (HCC)    ? 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 Chronic cough 08/29/2012   ? 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 COPD (chronic obstructive pulmonary disease) (HCC) 08/13/2012   ? Diabetes (HCC)    ? Dyspnea    ? Edema    ? GERD (gastroesophageal reflux disease) 08/13/2012   ? History of CHF (congestive heart failure) 08/13/2012   ? History of MRSA infection 10/14/2012   ? 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    ? Hypertension 08/13/2012   ? IDA (iron deficiency anemia)    ? 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 Morbid obesity (HCC) 08/13/2012   ? On supplemental oxygen therapy    ? OSA on CPAP 08/13/2012   ? Pneumonia 04/2012    [facility]   ? Pneumonia due to COVID-19 virus 12/05/2018   ? Seasonal allergic reaction      Surgical History:   Procedure Laterality Date   ? LUNG SURGERY  2015    Bio bridge in left lung/rib cage was broken  from coughing   ? BACK SURGERY  Jan 2016   ? Right Heart Catheterization Right 11/06/2014    Performed by Cath, Physician at [facility] CATH LAB   ?  CARDIAC DEFIBRILLATOR PLACEMENT  2017   ? Right Heart Catheterization With Insertion Pulmonary Artery Sensor Right 03/30/2015    Performed by Harley Alto, MD at [facility] CATH LAB   ? Insert CRT-D and Leads Right 11/25/2015    Performed by Deniece Ree, MD at [facility] EP LAB   ? Fluoroscopy N/A 11/25/2015    Performed by Deniece Ree, MD at [facility] EP LAB   ? Defibrillation Threshold Testing at ICD Implant N/A 11/25/2015    Performed by Deniece Ree, MD at [facility] EP LAB   ? VARICOSE VEIN SURGERY Left 01/05/2016    left small saphenous endovenous ablation with left leg microphlebectomy-Dr. Junita Push   ? HX MICROPHLEBECTOMY Right 07/06/2016    [facility]   ? HX ENDOVENOUS ABLATION OF THE SMALL SAPHENOUS VEIN Right 07/06/2016    [facility]   ? ANGIOGRAPHY CORONARY ARTERY WITH LEFT HEART CATHETERIZATION N/A 04/02/2017    Performed by Nat Math, MD at [facility] CATH LAB   ? PERCUTANEOUS CORONARY STENT PLACEMENT WITH ANGIOPLASTY N/A 04/02/2017    Performed by Nat Math, MD at [facility] CATH LAB   ? PERCUTANEOUS CORONARY STENT PLACEMENT WITH ANGIOPLASTY N/A 06/29/2017    Performed by Nat Math, MD at [facility] CATH LAB   ? LEFT ULNAR NERVE DECOMPRESSION AT ELBOW Left 04/30/2018    Performed by Letitia Neri, MD at [facility] OR   ? REMOVAL AND REPLACEMENT IMPLANTABLE DEFIBRILLATOR GENERATOR - MULTIPLE LEAD SYSTEM Right 11/24/2020    Performed by Annamarie Dawley, MD at [facility] EP LAB   ? PERCUTANEOUS TRANSCATHETER PLACEMENT INTRACORONARY STENT WITH/ WITHOUT ANGIOPLASTY - SINGLE MAJOR CORONARY ARTERY/ BRANCH   LT MAIN N/A 05/27/2021    Performed by Julienne Kass, MD at [facility] CATH LAB   ? BRONCHOSCOPY     ? CARDIAC CATHERIZATION  several years ago    [place]   ? DOPPLER ECHOCARDIOGRAPHY     ? HERNIA REPAIR     ? HX CHOLECYSTECTOMY     ? HX HEART CATHETERIZATION     ? HX LUMBAR LAMINECTOMY     ? HX PACEMAKER PLACEMENT       Family History   Problem Relation Age of Onset   ? Cancer Mother         liver   ? Stroke Sister    ? Cancer Father    ? Coronary Artery Disease Father    ? None Reported Sister      Social History     Socioeconomic History   ? Marital status: Married     Spouse name: [name]   ? Number of children: 0   Occupational History   ? Occupation: former Therapist, music: RETIRED     Comment: lives in a 82 year old house with wife   Tobacco Use   ? Smoking status: Former     Packs/day: 2.00     Years: 40.00     Pack years: 80.00     Types: Cigarettes     Quit date: 07/09/1998     Years since quitting: 23.0   ? Smokeless tobacco: Never   Vaping Use   ? Vaping Use: Never used   Substance and Sexual Activity   ? Alcohol use: Yes     Comment: occasion   ? Drug use: Never   ? Sexual activity: Not Currently           Objective       Medications  Current Facility-Administered Medications   Medication   ? albuterol sulfate (PROAIR HFA) inhaler 2 puff   ? allopurinoL (ZYLOPRIM) tablet 300 mg   ? amiodarone (CORDARONE) tablet 400 mg   ? arformoteroL (BROVANA) nebulizer solution 15 mcg   ? atorvastatin (LIPITOR) tablet 40 mg   ? budesonide (PULMICORT) nebulizer solution 0.25 mg   ? bumetanide (BUMEX) injection 4 mg   ? clopiDOGreL (PLAVIX) tablet 75 mg   ? dextrose 50% (D50) syringe 25-50 mL   ? enoxaparin (LOVENOX) syringe 40 mg   ? finasteride (PROSCAR) tablet 5 mg   ? gabapentin (NEURONTIN) capsule 400 mg   ? insulin aspart (U-100) (NOVOLOG FLEXPEN U-100 INSULIN) injection PEN 0-6 Units   ? melatonin tablet 5 mg   ? montelukast (SINGULAIR) tablet 10 mg   ? nitroglycerin (NITROSTAT) tablet 0.4 mg   ? ondansetron (ZOFRAN ODT) rapid dissolve tablet 4 mg    Or   ? ondansetron (ZOFRAN) injection 4 mg   ? pantoprazole DR (PROTONIX) tablet 40 mg   ? polyethylene glycol 3350 (MIRALAX) packet 17 g   ? senna/docusate (SENOKOT-S) tablet 1 tablet   ? sodium chloride (NEBUSAL) 3 % nebulizer solution   ? tamsulosin (FLOMAX) capsule 0.4 mg   ? [START ON 07/21/2021] venlafaxine XR (EFFEXOR XR) capsule 75 mg Vital Signs: Last Filed                 Vital Signs: 24 Hour Range   BP: 117/61 (05/31 1955)  Temp: 37.1 ?C (98.8 ?F) (05/31 1955)  Pulse: 90 (05/31 1955)  Respirations: 18 PER MINUTE (05/31 1955)  SpO2: 97 % (05/31 1955)  O2 Device: Nasal cannula (05/31 1955)  O2 Liter Flow: 4 Lpm (05/31 1955)  Height: 180.3 cm (5' 11) (05/31 2031) BP: (111-117)/(61-63)   Temp:  [37.1 ?C (98.8 ?F)]   Pulse:  [88-90]   Respirations:  [18 PER MINUTE]   SpO2:  [96 %-97 %]   O2 Device: Nasal cannula  O2 Liter Flow: 4 Lpm       Intake/Output Summary:  (Last 24 hours)    Intake/Output Summary (Last 24 hours) at 07/20/2021 2143  Last data filed at 07/20/2021 1957  Gross per 24 hour   Intake 0 ml   Output 200 ml   Net -200 ml           Physical Exam  Constitutional:       General: He is not in acute distress.     Appearance: Normal appearance. He is obese. He is not ill-appearing or toxic-appearing.   HENT:      Head: Normocephalic and atraumatic.      Nose: Nose normal. No congestion.      Mouth/Throat:      Mouth: Mucous membranes are moist.      Pharynx: No oropharyngeal exudate.   Eyes:      General: No scleral icterus.  Cardiovascular:      Rate and Rhythm: Normal rate and regular rhythm.      Pulses: Normal pulses.      Heart sounds: Normal heart sounds. No murmur heard.     No friction rub. No gallop.   Pulmonary:      Effort: Pulmonary effort is normal.      Breath sounds: Normal breath sounds. No wheezing, rhonchi or rales.   Abdominal:      General: Abdomen is flat. Bowel sounds are normal. There is no distension.      Palpations: Abdomen is soft.  Tenderness: There is no abdominal tenderness. There is no guarding.   Musculoskeletal:      Cervical back: No tenderness.      Right lower leg: Edema present.      Left lower leg: Edema present.   Skin:     General: Skin is warm.      Capillary Refill: Capillary refill takes less than 2 seconds.      Findings: Rash (Chronic venous stasis changes to bilateral lower extremity) present.   Neurological:      General: No focal deficit present.      Mental Status: He is alert and oriented to person, place, and time.   Psychiatric:         Mood and Affect: Mood normal.         Behavior: Behavior normal.         Thought Content: Thought content normal.                 Lab Review  Pertinent labs reviewed    Point of Care Testing  (Last 24 hours)       Radiology and other Diagnostics Review:    Pertinent radiology reviewed.    Lloyd Huger, DO  Internal Medicine, PGY-3  Available on [facility]  Pager [phone]

## 2021-07-22 ENCOUNTER — Inpatient Hospital Stay: Admit: 2021-07-22 | Discharge: 2021-07-22 | Payer: MEDICARE

## 2021-07-22 MED ADMIN — IRON SUCROSE 100 MG IRON/5 ML IV SOLN GROUP [280019]: 300 mg | INTRAVENOUS | @ 15:00:00 | Stop: 2021-07-24 | NDC 00517234001

## 2021-07-22 MED ADMIN — SODIUM CHLORIDE 0.9 % IV SOLP [27838]: 300 mg | INTRAVENOUS | @ 15:00:00 | Stop: 2021-07-24 | NDC 00338004902

## 2021-07-22 MED ADMIN — VANCOMYCIN 1.5G IN 0.9% NACL IVPB (BATCHED) [213737]: 1500 mg | INTRAVENOUS | @ 22:00:00 | NDC 54029433309

## 2021-07-22 MED ADMIN — BUMETANIDE 2 MG PO TAB [9311]: 3 mg | ORAL | @ 22:00:00 | NDC 50268013211

## 2021-07-22 MED ADMIN — POTASSIUM CHLORIDE 20 MEQ PO TBTQ [35943]: 60 meq | ORAL | @ 15:00:00 | Stop: 2021-07-22 | NDC 00832532511

## 2021-07-22 MED ADMIN — MAGNESIUM SULFATE IN D5W 1 GRAM/100 ML IV PGBK [166578]: 1 g | INTRAVENOUS | @ 19:00:00 | Stop: 2021-07-22 | NDC 00338170940

## 2021-07-22 MED ADMIN — ALBUTEROL SULFATE 2.5 MG /3 ML (0.083 %) IN NEBU [250]: 2.5 mg | RESPIRATORY_TRACT | @ 14:00:00 | NDC 60687039579

## 2021-07-22 MED ADMIN — B COMP NO3-FOLIC-C-BIOTIN-ZINC 1-60-300-12.5 MG-MG-MCG-MG PO TAB [173534]: 1 | ORAL | @ 15:00:00 | NDC 59528031701

## 2021-07-22 MED ADMIN — BUMETANIDE 1 MG PO TAB [9310]: 3 mg | ORAL | @ 15:00:00 | NDC 00904701606

## 2021-07-22 MED ADMIN — BENZONATATE 100 MG PO CAP [988]: 100 mg | ORAL | @ 21:00:00 | NDC 00904715361

## 2021-07-22 MED ADMIN — SACUBITRIL-VALSARTAN 24-26 MG PO TAB [325930]: 1 | ORAL | @ 15:00:00 | NDC 00078065920

## 2021-07-22 MED ADMIN — GUAIFENESIN 100 MG/5 ML PO LIQD [79220]: 200 mg | ORAL | @ 15:00:00 | NDC 00121174405

## 2021-07-22 MED ADMIN — BUMETANIDE 1 MG PO TAB [9310]: 3 mg | ORAL | @ 22:00:00 | NDC 00904701606

## 2021-07-22 MED ADMIN — ALBUTEROL SULFATE 2.5 MG /3 ML (0.083 %) IN NEBU [250]: 2.5 mg | RESPIRATORY_TRACT | @ 02:00:00 | NDC 60687039579

## 2021-07-22 MED ADMIN — BENZONATATE 100 MG PO CAP [988]: 100 mg | ORAL | @ 15:00:00 | NDC 00904715361

## 2021-07-22 MED ADMIN — MAGNESIUM SULFATE IN D5W 1 GRAM/100 ML IV PGBK [166578]: 1 g | INTRAVENOUS | @ 15:00:00 | Stop: 2021-07-22 | NDC 00338170940

## 2021-07-22 MED ADMIN — MAGNESIUM SULFATE IN D5W 1 GRAM/100 ML IV PGBK [166578]: 1 g | INTRAVENOUS | @ 23:00:00 | Stop: 2021-07-23 | NDC 00338170940

## 2021-07-23 ENCOUNTER — Encounter: Admit: 2021-07-23 | Discharge: 2021-07-23 | Payer: MEDICARE

## 2021-07-23 MED ADMIN — METOPROLOL TARTRATE 25 MG PO TAB [37637]: 12.5 mg | ORAL | @ 01:00:00 | Stop: 2021-07-23 | NDC 62584026511

## 2021-07-23 MED ADMIN — GUAIFENESIN 100 MG/5 ML PO LIQD [79220]: 200 mg | ORAL | @ 01:00:00 | NDC 00121174405

## 2021-07-23 MED ADMIN — ALBUTEROL SULFATE 2.5 MG /3 ML (0.083 %) IN NEBU [250]: 2.5 mg | RESPIRATORY_TRACT | @ 14:00:00 | NDC 60687039579

## 2021-07-23 MED ADMIN — SACUBITRIL-VALSARTAN 24-26 MG PO TAB [325930]: 1 | ORAL | @ 01:00:00 | NDC 00078065920

## 2021-07-23 MED ADMIN — BENZONATATE 100 MG PO CAP [988]: 100 mg | ORAL | @ 15:00:00 | NDC 00904715361

## 2021-07-23 MED ADMIN — ALBUTEROL SULFATE 2.5 MG /3 ML (0.083 %) IN NEBU [250]: 2.5 mg | RESPIRATORY_TRACT | @ 07:00:00 | NDC 60687039579

## 2021-07-23 MED ADMIN — GUAIFENESIN 100 MG/5 ML PO LIQD [79220]: 200 mg | ORAL | @ 15:00:00 | NDC 00121174405

## 2021-07-23 MED ADMIN — IRON SUCROSE 100 MG IRON/5 ML IV SOLN GROUP [280019]: 300 mg | INTRAVENOUS | @ 15:00:00 | Stop: 2021-07-23 | NDC 00517234001

## 2021-07-23 MED ADMIN — ALBUTEROL SULFATE 2.5 MG /3 ML (0.083 %) IN NEBU [250]: 2.5 mg | RESPIRATORY_TRACT | @ 02:00:00 | NDC 60687039579

## 2021-07-23 MED ADMIN — BENZONATATE 100 MG PO CAP [988]: 100 mg | ORAL | @ 01:00:00 | NDC 00904715361

## 2021-07-23 MED ADMIN — LINEZOLID 600 MG PO TAB [80084]: 600 mg | ORAL | @ 15:00:00 | Stop: 2021-07-26 | NDC 00904655304

## 2021-07-23 MED ADMIN — BENZONATATE 100 MG PO CAP [988]: 100 mg | ORAL | @ 21:00:00 | NDC 00904715361

## 2021-07-23 MED ADMIN — BENZONATATE 100 MG PO CAP [988]: 100 mg | ORAL | @ 07:00:00 | NDC 00904715361

## 2021-07-23 MED ADMIN — SODIUM CHLORIDE 0.9 % IV SOLP [27838]: 300 mg | INTRAVENOUS | @ 15:00:00 | Stop: 2021-07-23 | NDC 00338004902

## 2021-07-23 MED ADMIN — B COMP NO3-FOLIC-C-BIOTIN-ZINC 1-60-300-12.5 MG-MG-MCG-MG PO TAB [173534]: 1 | ORAL | @ 15:00:00 | NDC 59528031701

## 2021-07-23 MED ADMIN — GUAIFENESIN 100 MG/5 ML PO LIQD [79220]: 200 mg | ORAL | @ 21:00:00 | NDC 00121174405

## 2021-07-24 ENCOUNTER — Inpatient Hospital Stay: Admit: 2021-07-24 | Discharge: 2021-07-24 | Payer: MEDICARE

## 2021-07-24 MED ADMIN — METOLAZONE 5 MG PO TAB [10588]: 5 mg | ORAL | @ 23:00:00 | Stop: 2021-07-24 | NDC 51079002401

## 2021-07-24 MED ADMIN — BUMETANIDE 0.25 MG/ML IJ SOLN [9308]: 2 mg | INTRAVENOUS | @ 18:00:00 | Stop: 2021-07-24 | NDC 68462046940

## 2021-07-24 MED ADMIN — ALBUTEROL SULFATE 2.5 MG /3 ML (0.083 %) IN NEBU [250]: 2.5 mg | RESPIRATORY_TRACT | @ 13:00:00 | NDC 60687039579

## 2021-07-24 MED ADMIN — BENZONATATE 100 MG PO CAP [988]: 100 mg | ORAL | @ 10:00:00 | NDC 00904715361

## 2021-07-24 MED ADMIN — ACETAMINOPHEN 325 MG PO TAB [101]: 650 mg | ORAL | @ 13:00:00 | NDC 00904677361

## 2021-07-24 MED ADMIN — POTASSIUM CHLORIDE 20 MEQ PO TBTQ [35943]: 40 meq | ORAL | @ 23:00:00 | Stop: 2021-07-24 | NDC 00832532511

## 2021-07-24 MED ADMIN — B COMP NO3-FOLIC-C-BIOTIN-ZINC 1-60-300-12.5 MG-MG-MCG-MG PO TAB [173534]: 1 | ORAL | @ 16:00:00 | NDC 59528031701

## 2021-07-24 MED ADMIN — ALBUTEROL SULFATE 2.5 MG /3 ML (0.083 %) IN NEBU [250]: 2.5 mg | RESPIRATORY_TRACT | @ 02:00:00 | NDC 60687039579

## 2021-07-24 MED ADMIN — LINEZOLID 600 MG PO TAB [80084]: 600 mg | ORAL | @ 03:00:00 | Stop: 2021-07-26 | NDC 00904655304

## 2021-07-24 MED ADMIN — LINEZOLID 600 MG PO TAB [80084]: 600 mg | ORAL | @ 13:00:00 | Stop: 2021-07-26 | NDC 00904655304

## 2021-07-24 MED ADMIN — BUMETANIDE 0.25 MG/ML IJ SOLN [9308]: 4 mg | INTRAVENOUS | @ 12:00:00 | Stop: 2021-07-24 | NDC 72205010201

## 2021-07-24 MED ADMIN — ALBUTEROL SULFATE 2.5 MG /3 ML (0.083 %) IN NEBU [250]: 2.5 mg | RESPIRATORY_TRACT | @ 10:00:00 | NDC 60687039579

## 2021-07-24 MED ADMIN — GUAIFENESIN 100 MG/5 ML PO LIQD [79220]: 200 mg | ORAL | @ 10:00:00 | NDC 00121174405

## 2021-07-24 MED ADMIN — METOPROLOL SUCCINATE 25 MG PO TB24 [81866]: 12.5 mg | ORAL | @ 13:00:00 | NDC 00904632261

## 2021-07-25 MED ADMIN — BUMETANIDE 0.25 MG/ML IJ SOLN [9308]: 2 mg | INTRAVENOUS | @ 22:00:00 | Stop: 2021-07-25 | NDC 68462046940

## 2021-07-25 MED ADMIN — GUAIFENESIN 100 MG/5 ML PO LIQD [79220]: 200 mg | ORAL | @ 01:00:00 | NDC 00121174405

## 2021-07-25 MED ADMIN — ALBUTEROL SULFATE 2.5 MG /3 ML (0.083 %) IN NEBU [250]: 2.5 mg | RESPIRATORY_TRACT | @ 01:00:00 | NDC 60687039579

## 2021-07-25 MED ADMIN — BUMETANIDE 0.25 MG/ML IJ SOLN [9308]: 2 mg | INTRAVENOUS | @ 13:00:00 | Stop: 2021-07-25 | NDC 68462046940

## 2021-07-25 MED ADMIN — MAGNESIUM SULFATE IN D5W 1 GRAM/100 ML IV PGBK [166578]: 1 g | INTRAVENOUS | @ 10:00:00 | Stop: 2021-07-25 | NDC 00338170940

## 2021-07-25 MED ADMIN — B COMP NO3-FOLIC-C-BIOTIN-ZINC 1-60-300-12.5 MG-MG-MCG-MG PO TAB [173534]: 1 | ORAL | @ 13:00:00 | NDC 59528031701

## 2021-07-25 MED ADMIN — LINEZOLID 600 MG PO TAB [80084]: 600 mg | ORAL | @ 01:00:00 | Stop: 2021-07-26 | NDC 00904655304

## 2021-07-25 MED ADMIN — BENZONATATE 100 MG PO CAP [988]: 100 mg | ORAL | @ 01:00:00 | NDC 00904715361

## 2021-07-25 MED ADMIN — ALLOPURINOL 100 MG PO TAB [310]: 50 mg | ORAL | @ 13:00:00 | NDC 00904704161

## 2021-07-25 MED ADMIN — ALBUTEROL SULFATE 2.5 MG /3 ML (0.083 %) IN NEBU [250]: 2.5 mg | RESPIRATORY_TRACT | @ 13:00:00 | NDC 60687039579

## 2021-07-25 MED ADMIN — LINEZOLID 600 MG PO TAB [80084]: 600 mg | ORAL | @ 13:00:00 | Stop: 2021-07-26 | NDC 72606000111

## 2021-07-25 MED ADMIN — POTASSIUM CHLORIDE 20 MEQ PO TBTQ [35943]: 40 meq | ORAL | @ 13:00:00 | Stop: 2021-07-25 | NDC 00832532511

## 2021-07-26 ENCOUNTER — Inpatient Hospital Stay: Admit: 2021-07-26 | Discharge: 2021-07-26 | Payer: MEDICARE

## 2021-07-26 MED ADMIN — HEPARIN, PORCINE (PF) 5,000 UNIT/0.5 ML IJ SYRG [95535]: 5000 [IU] | SUBCUTANEOUS | @ 11:00:00 | NDC 00409131611

## 2021-07-26 MED ADMIN — ACETAMINOPHEN 325 MG PO TAB [101]: 650 mg | ORAL | @ 20:00:00 | NDC 00904677361

## 2021-07-26 MED ADMIN — POTASSIUM CHLORIDE 20 MEQ PO TBTQ [35943]: 40 meq | ORAL | @ 22:00:00 | Stop: 2021-07-26 | NDC 00832532511

## 2021-07-26 MED ADMIN — POTASSIUM CHLORIDE IN WATER 10 MEQ/50 ML IV PGBK [11075]: 10 meq | INTRAVENOUS | @ 16:00:00 | Stop: 2021-07-26 | NDC 00338070541

## 2021-07-26 MED ADMIN — BUMETANIDE 0.25 MG/ML IJ SOLN [9308]: 1 mg | INTRAVENOUS | @ 22:00:00 | Stop: 2021-07-26 | NDC 68462046940

## 2021-07-26 MED ADMIN — SODIUM CHLORIDE 0.9 % IV PGBK (MB+) [95161]: 4.5 g | INTRAVENOUS | @ 19:00:00 | NDC 00338915930

## 2021-07-26 MED ADMIN — ALBUTEROL SULFATE 2.5 MG /3 ML (0.083 %) IN NEBU [250]: 2.5 mg | RESPIRATORY_TRACT | @ 03:00:00 | NDC 60687039579

## 2021-07-26 MED ADMIN — GUAIFENESIN 100 MG/5 ML PO LIQD [79220]: 200 mg | ORAL | @ 14:00:00 | NDC 00121174405

## 2021-07-26 MED ADMIN — POTASSIUM CHLORIDE 20 MEQ PO TBTQ [35943]: 40 meq | ORAL | @ 18:00:00 | Stop: 2021-07-26 | NDC 00832532511

## 2021-07-26 MED ADMIN — BUMETANIDE 0.25 MG/ML IJ SOLN [9308]: 2 mg | INTRAVENOUS | @ 09:00:00 | Stop: 2021-07-26 | NDC 68462046940

## 2021-07-26 MED ADMIN — SODIUM CHLORIDE 0.9 % IV PGBK (MB+) [95161]: 4.5 g | INTRAVENOUS | @ 13:00:00 | NDC 00338915930

## 2021-07-26 MED ADMIN — ACETAMINOPHEN 325 MG PO TAB [101]: 650 mg | ORAL | @ 05:00:00 | NDC 00904677361

## 2021-07-26 MED ADMIN — ALBUTEROL SULFATE 2.5 MG /3 ML (0.083 %) IN NEBU [250]: 2.5 mg | RESPIRATORY_TRACT | @ 13:00:00 | NDC 60687039579

## 2021-07-26 MED ADMIN — PIPERACILLIN-TAZOBACTAM 4.5 GRAM IV SOLR [80419]: 4.5 g | INTRAVENOUS | @ 19:00:00 | NDC 60505615900

## 2021-07-26 MED ADMIN — HEPARIN, PORCINE (PF) 5,000 UNIT/0.5 ML IJ SYRG [95535]: 5000 [IU] | SUBCUTANEOUS | @ 18:00:00 | NDC 00409131611

## 2021-07-26 MED ADMIN — ALLOPURINOL 100 MG PO TAB [310]: 50 mg | ORAL | @ 13:00:00 | NDC 00904704161

## 2021-07-26 MED ADMIN — NOREPINEPHRINE BITARTRATE-NACL 4 MG/250 ML (16 MCG/ML) IV SOLN [173409]: 0.02 ug/kg/min | INTRAVENOUS | @ 16:00:00 | NDC 70092103305

## 2021-07-26 MED ADMIN — GUAIFENESIN 100 MG/5 ML PO LIQD [79220]: 200 mg | ORAL | @ 01:00:00 | NDC 00121174405

## 2021-07-26 MED ADMIN — B COMP NO3-FOLIC-C-BIOTIN-ZINC 1-60-300-12.5 MG-MG-MCG-MG PO TAB [173534]: 1 | ORAL | @ 13:00:00 | NDC 59528031701

## 2021-07-26 MED ADMIN — HEPARIN, PORCINE (PF) 5,000 UNIT/0.5 ML IJ SYRG [95535]: 5000 [IU] | SUBCUTANEOUS | @ 02:00:00 | NDC 00409131611

## 2021-07-26 MED ADMIN — LINEZOLID 600 MG PO TAB [80084]: 600 mg | ORAL | @ 01:00:00 | Stop: 2021-07-26 | NDC 72606000111

## 2021-07-26 MED ADMIN — MAGNESIUM SULFATE IN D5W 1 GRAM/100 ML IV PGBK [166578]: 1 g | INTRAVENOUS | @ 14:00:00 | Stop: 2021-07-26 | NDC 00338170940

## 2021-07-26 MED ADMIN — MAGNESIUM SULFATE IN D5W 1 GRAM/100 ML IV PGBK [166578]: 1 g | INTRAVENOUS | @ 11:00:00 | Stop: 2021-07-26 | NDC 00338170940

## 2021-07-26 MED ADMIN — PIPERACILLIN-TAZOBACTAM 4.5 GRAM IV SOLR [80419]: 4.5 g | INTRAVENOUS | @ 13:00:00 | NDC 60505615900

## 2021-07-27 ENCOUNTER — Encounter: Admit: 2021-07-27 | Discharge: 2021-07-27 | Payer: MEDICARE

## 2021-07-27 MED ADMIN — CEFTRIAXONE INJ 1GM IVP [210253]: 2 g | INTRAVENOUS | @ 19:00:00 | Stop: 2021-07-29 | NDC 00143985701

## 2021-07-27 MED ADMIN — ALBUTEROL SULFATE 2.5 MG /3 ML (0.083 %) IN NEBU [250]: 2.5 mg | RESPIRATORY_TRACT | @ 03:00:00 | NDC 60687039579

## 2021-07-27 MED ADMIN — PIPERACILLIN-TAZOBACTAM 4.5 GRAM IV SOLR [80419]: 4.5 g | INTRAVENOUS | @ 13:00:00 | Stop: 2021-07-27 | NDC 60505615900

## 2021-07-27 MED ADMIN — NOREPINEPHRINE BITARTRATE-NACL 4 MG/250 ML (16 MCG/ML) IV SOLN [173409]: 0.08 ug/kg/min | INTRAVENOUS | @ 16:00:00 | NDC 70092103305

## 2021-07-27 MED ADMIN — SODIUM CHLORIDE 0.9 % IV PGBK (MB+) [95161]: 4.5 g | INTRAVENOUS | @ 06:00:00 | Stop: 2021-07-27 | NDC 00338915930

## 2021-07-27 MED ADMIN — HEPARIN, PORCINE (PF) 5,000 UNIT/0.5 ML IJ SYRG [95535]: 5000 [IU] | SUBCUTANEOUS | @ 12:00:00 | NDC 00409131611

## 2021-07-27 MED ADMIN — NOREPINEPHRINE BITARTRATE-NACL 4 MG/250 ML (16 MCG/ML) IV SOLN [173409]: 0.05 ug/kg/min | INTRAVENOUS | @ 06:00:00 | NDC 70092103305

## 2021-07-27 MED ADMIN — PIPERACILLIN-TAZOBACTAM 4.5 GRAM IV SOLR [80419]: 4.5 g | INTRAVENOUS | @ 01:00:00 | NDC 60505615900

## 2021-07-27 MED ADMIN — HEPARIN, PORCINE (PF) 5,000 UNIT/0.5 ML IJ SYRG [95535]: 5000 [IU] | SUBCUTANEOUS | @ 04:00:00 | NDC 00409131611

## 2021-07-27 MED ADMIN — ALLOPURINOL 100 MG PO TAB [310]: 50 mg | ORAL | @ 13:00:00 | NDC 00904704161

## 2021-07-27 MED ADMIN — ALBUTEROL SULFATE 2.5 MG /3 ML (0.083 %) IN NEBU [250]: 2.5 mg | RESPIRATORY_TRACT | @ 14:00:00 | NDC 60687039579

## 2021-07-27 MED ADMIN — ACETAMINOPHEN 325 MG PO TAB [101]: 650 mg | ORAL | @ 01:00:00 | NDC 00904677361

## 2021-07-27 MED ADMIN — POTASSIUM CHLORIDE 20 MEQ PO TBTQ [35943]: 40 meq | ORAL | @ 21:00:00 | Stop: 2021-07-27 | NDC 00832532511

## 2021-07-27 MED ADMIN — SODIUM CHLORIDE 0.9 % IV PGBK (MB+) [95161]: 4.5 g | INTRAVENOUS | @ 13:00:00 | Stop: 2021-07-27 | NDC 00338915930

## 2021-07-27 MED ADMIN — B COMP NO3-FOLIC-C-BIOTIN-ZINC 1-60-300-12.5 MG-MG-MCG-MG PO TAB [173534]: 1 | ORAL | @ 13:00:00 | NDC 59528031701

## 2021-07-27 MED ADMIN — HEPARIN, PORCINE (PF) 5,000 UNIT/0.5 ML IJ SYRG [95535]: 5000 [IU] | SUBCUTANEOUS | @ 19:00:00 | NDC 00409131611

## 2021-07-27 MED ADMIN — SODIUM CHLORIDE 0.9 % IV PGBK (MB+) [95161]: 4.5 g | INTRAVENOUS | @ 01:00:00 | NDC 00338915930

## 2021-07-27 MED ADMIN — PIPERACILLIN-TAZOBACTAM 4.5 GRAM IV SOLR [80419]: 4.5 g | INTRAVENOUS | @ 06:00:00 | Stop: 2021-07-27 | NDC 60505615900

## 2021-07-27 MED ADMIN — BUMETANIDE 0.25 MG/ML IJ SOLN [9308]: 2 mg | INTRAVENOUS | @ 19:00:00 | Stop: 2021-07-27 | NDC 68462046940

## 2021-07-27 MED ADMIN — POTASSIUM CHLORIDE 20 MEQ PO TBTQ [35943]: 40 meq | ORAL | @ 13:00:00 | Stop: 2021-07-27 | NDC 00832532511

## 2021-07-28 ENCOUNTER — Inpatient Hospital Stay: Admit: 2021-07-28 | Discharge: 2021-07-28 | Payer: MEDICARE

## 2021-07-28 MED ADMIN — ALLOPURINOL 100 MG PO TAB [310]: 100 mg | ORAL | @ 14:00:00 | NDC 00904704161

## 2021-07-28 MED ADMIN — POTASSIUM CHLORIDE 20 MEQ PO TBTQ [35943]: 60 meq | ORAL | @ 12:00:00 | Stop: 2021-07-28 | NDC 00832532511

## 2021-07-28 MED ADMIN — ACETAMINOPHEN 325 MG PO TAB [101]: 650 mg | ORAL | @ 19:00:00 | NDC 00904677361

## 2021-07-28 MED ADMIN — CEFTRIAXONE INJ 1GM IVP [210253]: 2 g | INTRAVENOUS | @ 19:00:00 | Stop: 2021-07-28 | NDC 00143985701

## 2021-07-28 MED ADMIN — DICLOFENAC SODIUM 1 % TP GEL [168032]: 2 g | TOPICAL | @ 21:00:00 | NDC 65162083366

## 2021-07-28 MED ADMIN — MAGNESIUM SULFATE IN D5W 1 GRAM/100 ML IV PGBK [166578]: 1 g | INTRAVENOUS | @ 17:00:00 | Stop: 2021-07-28 | NDC 00338170940

## 2021-07-28 MED ADMIN — HEPARIN, PORCINE (PF) 5,000 UNIT/0.5 ML IJ SYRG [95535]: 5000 [IU] | SUBCUTANEOUS | @ 19:00:00 | NDC 00409131611

## 2021-07-28 MED ADMIN — BUMETANIDE 0.25 MG/ML IJ SOLN [9308]: 1 mg | INTRAVENOUS | @ 23:00:00 | Stop: 2021-07-28 | NDC 72205010101

## 2021-07-28 MED ADMIN — OXYCODONE 5 MG PO TAB [10814]: 5 mg | ORAL | @ 19:00:00 | NDC 00904696661

## 2021-07-28 MED ADMIN — ALBUTEROL SULFATE 2.5 MG /3 ML (0.083 %) IN NEBU [250]: 2.5 mg | RESPIRATORY_TRACT | @ 14:00:00 | NDC 60687039579

## 2021-07-28 MED ADMIN — MAGNESIUM SULFATE IN D5W 1 GRAM/100 ML IV PGBK [166578]: 1 g | INTRAVENOUS | @ 12:00:00 | Stop: 2021-07-28 | NDC 00338170940

## 2021-07-28 MED ADMIN — BUMETANIDE 0.25 MG/ML IJ SOLN [9308]: 1 mg | INTRAVENOUS | @ 14:00:00 | Stop: 2021-07-28 | NDC 68462046940

## 2021-07-28 MED ADMIN — NOREPINEPHRINE BITARTRATE-NACL 4 MG/250 ML (16 MCG/ML) IV SOLN [173409]: 0.02 ug/kg/min | INTRAVENOUS | @ 09:00:00 | Stop: 2021-07-28 | NDC 70092103305

## 2021-07-28 MED ADMIN — ALBUTEROL SULFATE 2.5 MG /3 ML (0.083 %) IN NEBU [250]: 2.5 mg | RESPIRATORY_TRACT | @ 02:00:00 | NDC 60687039579

## 2021-07-28 MED ADMIN — B COMP NO3-FOLIC-C-BIOTIN-ZINC 1-60-300-12.5 MG-MG-MCG-MG PO TAB [173534]: 1 | ORAL | @ 14:00:00 | NDC 59528031701

## 2021-07-28 MED ADMIN — HEPARIN, PORCINE (PF) 5,000 UNIT/0.5 ML IJ SYRG [95535]: 5000 [IU] | SUBCUTANEOUS | @ 03:00:00 | NDC 00409131611

## 2021-07-28 MED ADMIN — HEPARIN, PORCINE (PF) 5,000 UNIT/0.5 ML IJ SYRG [95535]: 5000 [IU] | SUBCUTANEOUS | @ 11:00:00 | NDC 00409131611

## 2021-07-29 ENCOUNTER — Inpatient Hospital Stay: Admit: 2021-07-29 | Discharge: 2021-07-29 | Payer: MEDICARE

## 2021-07-29 ENCOUNTER — Encounter: Admit: 2021-07-29 | Discharge: 2021-07-29 | Payer: MEDICARE

## 2021-07-29 MED ADMIN — MAGNESIUM SULFATE IN D5W 1 GRAM/100 ML IV PGBK [166578]: 1 g | INTRAVENOUS | @ 12:00:00 | Stop: 2021-07-29 | NDC 00338170940

## 2021-07-29 MED ADMIN — ACETAMINOPHEN 325 MG PO TAB [101]: 650 mg | ORAL | @ 15:00:00 | NDC 00904677361

## 2021-07-29 MED ADMIN — HEPARIN, PORCINE (PF) 5,000 UNIT/0.5 ML IJ SYRG [95535]: 5000 [IU] | SUBCUTANEOUS | @ 10:00:00 | NDC 00409131611

## 2021-07-29 MED ADMIN — THIAMINE MONONITRATE (VIT B1) 100 MG PO TAB [303868]: 100 mg | ORAL | @ 14:00:00 | NDC 50268085111

## 2021-07-29 MED ADMIN — OXYCODONE 5 MG PO TAB [10814]: 5 mg | ORAL | @ 20:00:00 | NDC 00904696661

## 2021-07-29 MED ADMIN — BUMETANIDE 0.25 MG/ML IJ SOLN [9308]: 2 mg | INTRAVENOUS | @ 12:00:00 | Stop: 2021-07-29 | NDC 72205010101

## 2021-07-29 MED ADMIN — POLYETHYLENE GLYCOL 3350 17 GRAM PO PWPK [25424]: 17 g | ORAL | @ 14:00:00 | NDC 00904693186

## 2021-07-29 MED ADMIN — ALLOPURINOL 100 MG PO TAB [310]: 100 mg | ORAL | @ 14:00:00 | NDC 00904704161

## 2021-07-29 MED ADMIN — HEPARIN, PORCINE (PF) 5,000 UNIT/0.5 ML IJ SYRG [95535]: 5000 [IU] | SUBCUTANEOUS | @ 20:00:00 | NDC 00409131611

## 2021-07-29 MED ADMIN — MAGNESIUM SULFATE IN D5W 1 GRAM/100 ML IV PGBK [166578]: 1 g | INTRAVENOUS | @ 17:00:00 | Stop: 2021-07-29 | NDC 00338170940

## 2021-07-29 MED ADMIN — GABAPENTIN 100 MG PO CAP [18309]: 200 mg | ORAL | @ 15:00:00 | NDC 00904666561

## 2021-07-29 MED ADMIN — ALBUTEROL SULFATE 2.5 MG /3 ML (0.083 %) IN NEBU [250]: 2.5 mg | RESPIRATORY_TRACT | @ 02:00:00 | NDC 60687039579

## 2021-07-29 MED ADMIN — BENZONATATE 100 MG PO CAP [988]: 100 mg | ORAL | @ 20:00:00 | NDC 00904715361

## 2021-07-29 MED ADMIN — GUAIFENESIN 100 MG/5 ML PO LIQD [79220]: 200 mg | ORAL | @ 01:00:00 | NDC 00121174405

## 2021-07-29 MED ADMIN — B COMP NO3-FOLIC-C-BIOTIN-ZINC 1-60-300-12.5 MG-MG-MCG-MG PO TAB [173534]: 1 | ORAL | @ 14:00:00 | NDC 59528031701

## 2021-07-29 MED ADMIN — SENNOSIDES-DOCUSATE SODIUM 8.6-50 MG PO TAB [40926]: 1 | ORAL | @ 14:00:00 | NDC 00536124801

## 2021-07-29 MED ADMIN — OXYCODONE 5 MG PO TAB [10814]: 5 mg | ORAL | @ 10:00:00 | NDC 00904696661

## 2021-07-29 MED ADMIN — ACETAMINOPHEN 325 MG PO TAB [101]: 650 mg | ORAL | @ 10:00:00 | NDC 00904677361

## 2021-07-29 MED ADMIN — ALBUTEROL SULFATE 2.5 MG /3 ML (0.083 %) IN NEBU [250]: 2.5 mg | RESPIRATORY_TRACT | @ 16:00:00 | NDC 60687039579

## 2021-07-29 MED ADMIN — HEPARIN, PORCINE (PF) 5,000 UNIT/0.5 ML IJ SYRG [95535]: 5000 [IU] | SUBCUTANEOUS | @ 03:00:00 | NDC 00409131611

## 2021-07-29 MED ADMIN — ACETAMINOPHEN 325 MG PO TAB [101]: 650 mg | ORAL | NDC 00904677361

## 2021-07-29 MED ADMIN — GUAIFENESIN 100 MG/5 ML PO LIQD [79220]: 200 mg | ORAL | @ 20:00:00 | NDC 00121174405

## 2021-07-29 MED ADMIN — GUAIFENESIN 100 MG/5 ML PO LIQD [79220]: 200 mg | ORAL | @ 11:00:00 | NDC 00121174405

## 2021-07-29 MED ADMIN — BENZONATATE 100 MG PO CAP [988]: 100 mg | ORAL | @ 01:00:00 | NDC 00904715361

## 2021-07-29 MED ADMIN — BENZONATATE 100 MG PO CAP [988]: 100 mg | ORAL | @ 11:00:00 | NDC 00904715361

## 2021-07-29 MED ADMIN — ACETAMINOPHEN 325 MG PO TAB [101]: 650 mg | ORAL | @ 20:00:00 | NDC 00904677361

## 2021-07-30 MED ADMIN — POTASSIUM CHLORIDE 20 MEQ PO TBTQ [35943]: 40 meq | ORAL | @ 13:00:00 | Stop: 2021-07-30 | NDC 00832532511

## 2021-07-30 MED ADMIN — AMITRIPTYLINE(#)/GABAPENTIN/BACLOFEN 2/6/2% TOPICAL CRM [214099]: TOPICAL | @ 19:00:00 | NDC 54029466709

## 2021-07-30 MED ADMIN — THIAMINE MONONITRATE (VIT B1) 100 MG PO TAB [303868]: 100 mg | ORAL | @ 13:00:00 | NDC 50268085111

## 2021-07-30 MED ADMIN — GUAIFENESIN 100 MG/5 ML PO LIQD [79220]: 200 mg | ORAL | @ 14:00:00 | NDC 00121174405

## 2021-07-30 MED ADMIN — AMITRIPTYLINE(#)/GABAPENTIN/BACLOFEN 2/6/2% TOPICAL CRM [214099]: TOPICAL | @ 01:00:00 | NDC 54029466709

## 2021-07-30 MED ADMIN — ACETAMINOPHEN 325 MG PO TAB [101]: 650 mg | ORAL | @ 13:00:00 | NDC 00904677361

## 2021-07-30 MED ADMIN — HEPARIN, PORCINE (PF) 5,000 UNIT/0.5 ML IJ SYRG [95535]: 5000 [IU] | SUBCUTANEOUS | @ 04:00:00 | NDC 00409131611

## 2021-07-30 MED ADMIN — SENNOSIDES-DOCUSATE SODIUM 8.6-50 MG PO TAB [40926]: 1 | ORAL | @ 13:00:00 | NDC 00536124801

## 2021-07-30 MED ADMIN — GABAPENTIN 100 MG PO CAP [18309]: 200 mg | ORAL | @ 01:00:00 | NDC 00904666561

## 2021-07-30 MED ADMIN — ALLOPURINOL 100 MG PO TAB [310]: 100 mg | ORAL | @ 13:00:00 | NDC 00904704161

## 2021-07-30 MED ADMIN — BUMETANIDE 0.25 MG/ML IJ SOLN [9308]: 2 mg | INTRAVENOUS | @ 13:00:00 | Stop: 2021-07-30 | NDC 72205010101

## 2021-07-30 MED ADMIN — ALBUTEROL SULFATE 2.5 MG /3 ML (0.083 %) IN NEBU [250]: 2.5 mg | RESPIRATORY_TRACT | @ 02:00:00 | NDC 60687039579

## 2021-07-30 MED ADMIN — B COMP NO3-FOLIC-C-BIOTIN-ZINC 1-60-300-12.5 MG-MG-MCG-MG PO TAB [173534]: 1 | ORAL | @ 13:00:00 | NDC 59528031701

## 2021-07-30 MED ADMIN — GUAIFENESIN 100 MG/5 ML PO LIQD [79220]: 200 mg | ORAL | @ 03:00:00 | NDC 00121174405

## 2021-07-30 MED ADMIN — BENZONATATE 100 MG PO CAP [988]: 100 mg | ORAL | @ 14:00:00 | NDC 00904715361

## 2021-07-30 MED ADMIN — POTASSIUM CHLORIDE 20 MEQ PO TBTQ [35943]: 40 meq | ORAL | @ 16:00:00 | Stop: 2021-07-30 | NDC 00832532511

## 2021-07-30 MED ADMIN — BENZONATATE 100 MG PO CAP [988]: 100 mg | ORAL | @ 03:00:00 | NDC 00904715361

## 2021-07-30 MED ADMIN — HEPARIN, PORCINE (PF) 5,000 UNIT/0.5 ML IJ SYRG [95535]: 5000 [IU] | SUBCUTANEOUS | @ 12:00:00 | Stop: 2021-07-30 | NDC 00409131611

## 2021-07-30 MED ADMIN — SENNOSIDES-DOCUSATE SODIUM 8.6-50 MG PO TAB [40926]: 1 | ORAL | @ 01:00:00 | NDC 00536124801

## 2021-07-30 MED ADMIN — POTASSIUM CHLORIDE 20 MEQ PO TBTQ [35943]: 40 meq | ORAL | @ 21:00:00 | Stop: 2021-07-30 | NDC 00832532511

## 2021-07-30 MED ADMIN — ALBUTEROL SULFATE 2.5 MG /3 ML (0.083 %) IN NEBU [250]: 2.5 mg | RESPIRATORY_TRACT | @ 14:00:00 | NDC 60687039579

## 2021-07-30 MED ADMIN — POLYETHYLENE GLYCOL 3350 17 GRAM PO PWPK [25424]: 17 g | ORAL | @ 13:00:00 | NDC 00904693186

## 2021-07-30 MED ADMIN — GABAPENTIN 100 MG PO CAP [18309]: 200 mg | ORAL | @ 13:00:00 | NDC 00904666561

## 2021-07-30 MED ADMIN — OXYCODONE 5 MG PO TAB [10814]: 5 mg | ORAL | @ 03:00:00 | NDC 00904696661

## 2021-07-31 MED ADMIN — ACETAMINOPHEN 325 MG PO TAB [101]: 650 mg | ORAL | @ 19:00:00 | NDC 00904677361

## 2021-07-31 MED ADMIN — GABAPENTIN 100 MG PO CAP [18309]: 200 mg | ORAL | @ 02:00:00 | NDC 00904666561

## 2021-07-31 MED ADMIN — SENNOSIDES-DOCUSATE SODIUM 8.6-50 MG PO TAB [40926]: 1 | ORAL | @ 14:00:00 | NDC 00536124801

## 2021-07-31 MED ADMIN — BENZONATATE 100 MG PO CAP [988]: 100 mg | ORAL | @ 02:00:00 | NDC 00904715361

## 2021-07-31 MED ADMIN — POTASSIUM CHLORIDE 20 MEQ PO TBTQ [35943]: 40 meq | ORAL | @ 14:00:00 | Stop: 2021-07-31 | NDC 00832532511

## 2021-07-31 MED ADMIN — THIAMINE MONONITRATE (VIT B1) 100 MG PO TAB [303868]: 100 mg | ORAL | @ 14:00:00 | NDC 50268085111

## 2021-07-31 MED ADMIN — ALBUTEROL SULFATE 2.5 MG /3 ML (0.083 %) IN NEBU [250]: 2.5 mg | RESPIRATORY_TRACT | @ 02:00:00 | NDC 60687039579

## 2021-07-31 MED ADMIN — METOPROLOL SUCCINATE 25 MG PO TB24 [81866]: 12.5 mg | ORAL | @ 14:00:00 | NDC 00904632261

## 2021-07-31 MED ADMIN — ALLOPURINOL 100 MG PO TAB [310]: 100 mg | ORAL | @ 14:00:00 | NDC 00904704161

## 2021-07-31 MED ADMIN — OXYCODONE 5 MG PO TAB [10814]: 5 mg | ORAL | @ 14:00:00 | NDC 00904696661

## 2021-07-31 MED ADMIN — ALBUTEROL SULFATE 2.5 MG /3 ML (0.083 %) IN NEBU [250]: 2.5 mg | RESPIRATORY_TRACT | @ 13:00:00 | NDC 60687039579

## 2021-07-31 MED ADMIN — POLYETHYLENE GLYCOL 3350 17 GRAM PO PWPK [25424]: 17 g | ORAL | @ 14:00:00 | NDC 00904693186

## 2021-07-31 MED ADMIN — ENOXAPARIN 40 MG/0.4 ML SC SYRG [85052]: 40 mg | SUBCUTANEOUS | @ 02:00:00 | NDC 00781324602

## 2021-07-31 MED ADMIN — B COMP NO3-FOLIC-C-BIOTIN-ZINC 1-60-300-12.5 MG-MG-MCG-MG PO TAB [173534]: 1 | ORAL | @ 15:00:00 | NDC 59528031701

## 2021-07-31 MED ADMIN — GUAIFENESIN 100 MG/5 ML PO LIQD [79220]: 200 mg | ORAL | @ 02:00:00 | NDC 00121174405

## 2021-07-31 MED ADMIN — SENNOSIDES-DOCUSATE SODIUM 8.6-50 MG PO TAB [40926]: 1 | ORAL | @ 02:00:00 | NDC 00536124801

## 2021-07-31 MED ADMIN — MAGNESIUM SULFATE IN D5W 1 GRAM/100 ML IV PGBK [166578]: 1 g | INTRAVENOUS | @ 14:00:00 | Stop: 2021-07-31 | NDC 00338170940

## 2021-07-31 MED ADMIN — BUMETANIDE 2 MG PO TAB [9311]: 2 mg | ORAL | @ 18:00:00 | NDC 50268013211

## 2021-07-31 MED ADMIN — GABAPENTIN 100 MG PO CAP [18309]: 200 mg | ORAL | @ 14:00:00 | NDC 00904666561

## 2021-08-01 ENCOUNTER — Encounter: Admit: 2021-08-01 | Discharge: 2021-08-01 | Payer: MEDICARE

## 2021-08-01 MED ADMIN — THIAMINE MONONITRATE (VIT B1) 100 MG PO TAB [303868]: 100 mg | ORAL | @ 13:00:00 | NDC 50268085111

## 2021-08-01 MED ADMIN — ACETAMINOPHEN 325 MG PO TAB [101]: 650 mg | ORAL | @ 22:00:00 | NDC 00904677361

## 2021-08-01 MED ADMIN — SENNOSIDES-DOCUSATE SODIUM 8.6-50 MG PO TAB [40926]: 1 | ORAL | @ 01:00:00 | NDC 00536124801

## 2021-08-01 MED ADMIN — ALLOPURINOL 100 MG PO TAB [310]: 100 mg | ORAL | @ 13:00:00 | NDC 00904704161

## 2021-08-01 MED ADMIN — GABAPENTIN 100 MG PO CAP [18309]: 200 mg | ORAL | @ 01:00:00 | NDC 00904666561

## 2021-08-01 MED ADMIN — POLYETHYLENE GLYCOL 3350 17 GRAM PO PWPK [25424]: 17 g | ORAL | @ 13:00:00 | NDC 00904693186

## 2021-08-01 MED ADMIN — B COMP NO3-FOLIC-C-BIOTIN-ZINC 1-60-300-12.5 MG-MG-MCG-MG PO TAB [173534]: 1 | ORAL | @ 13:00:00 | NDC 59528031701

## 2021-08-01 MED ADMIN — SENNOSIDES-DOCUSATE SODIUM 8.6-50 MG PO TAB [40926]: 1 | ORAL | @ 13:00:00 | NDC 00536124801

## 2021-08-01 MED ADMIN — ACETAMINOPHEN 325 MG PO TAB [101]: 650 mg | ORAL | @ 01:00:00 | NDC 00904677361

## 2021-08-01 MED ADMIN — OXYCODONE 5 MG PO TAB [10814]: 5 mg | ORAL | @ 17:00:00 | NDC 00904696661

## 2021-08-01 MED ADMIN — MAGNESIUM SULFATE IN D5W 1 GRAM/100 ML IV PGBK [166578]: 1 g | INTRAVENOUS | @ 14:00:00 | Stop: 2021-08-01 | NDC 00338170940

## 2021-08-01 MED ADMIN — BUMETANIDE 2 MG PO TAB [9311]: 2 mg | ORAL | @ 13:00:00 | NDC 50268013211

## 2021-08-01 MED ADMIN — ACETAMINOPHEN 325 MG PO TAB [101]: 650 mg | ORAL | @ 13:00:00 | NDC 00904677361

## 2021-08-01 MED ADMIN — ALBUTEROL SULFATE 2.5 MG /3 ML (0.083 %) IN NEBU [250]: 2.5 mg | RESPIRATORY_TRACT | @ 02:00:00 | NDC 60687039579

## 2021-08-01 MED ADMIN — OXYCODONE 5 MG PO TAB [10814]: 5 mg | ORAL | @ 11:00:00 | NDC 00904696661

## 2021-08-01 MED ADMIN — METOPROLOL SUCCINATE 25 MG PO TB24 [81866]: 12.5 mg | ORAL | @ 13:00:00 | NDC 00904632261

## 2021-08-01 MED ADMIN — GABAPENTIN 100 MG PO CAP [18309]: 200 mg | ORAL | @ 13:00:00 | NDC 00904666561

## 2021-08-01 MED ADMIN — OXYCODONE 5 MG PO TAB [10814]: 5 mg | ORAL | @ 01:00:00 | NDC 00904696661

## 2021-08-01 MED ADMIN — BENZONATATE 100 MG PO CAP [988]: 100 mg | ORAL | @ 01:00:00 | NDC 00904715361

## 2021-08-01 MED ADMIN — ALBUTEROL SULFATE 2.5 MG /3 ML (0.083 %) IN NEBU [250]: 2.5 mg | RESPIRATORY_TRACT | @ 14:00:00 | NDC 60687039579

## 2021-08-01 MED ADMIN — ENOXAPARIN 40 MG/0.4 ML SC SYRG [85052]: 40 mg | SUBCUTANEOUS | @ 01:00:00 | NDC 00781324602

## 2021-08-01 MED ADMIN — POTASSIUM CHLORIDE 20 MEQ PO TBTQ [35943]: 20 meq | ORAL | @ 14:00:00 | Stop: 2021-08-01 | NDC 00832532511

## 2021-08-02 ENCOUNTER — Encounter: Admit: 2021-08-02 | Discharge: 2021-08-02 | Payer: MEDICARE

## 2021-08-02 DIAGNOSIS — I5042 Chronic combined systolic (congestive) and diastolic (congestive) heart failure: Secondary | ICD-10-CM

## 2021-08-02 MED ORDER — METOLAZONE 2.5 MG PO TAB
2.5 mg | ORAL_TABLET | Freq: Every day | ORAL | 0 refills | 84.00000 days | Status: AC | PRN
Start: 2021-08-02 — End: ?

## 2021-08-02 MED ADMIN — METOPROLOL SUCCINATE 25 MG PO TB24 [81866]: 12.5 mg | ORAL | @ 15:00:00 | NDC 00904632261

## 2021-08-02 MED ADMIN — THIAMINE MONONITRATE (VIT B1) 100 MG PO TAB [303868]: 100 mg | ORAL | @ 15:00:00 | NDC 50268085111

## 2021-08-02 MED ADMIN — BENZONATATE 100 MG PO CAP [988]: 100 mg | ORAL | @ 05:00:00 | NDC 00904715361

## 2021-08-02 MED ADMIN — ACETAMINOPHEN 325 MG PO TAB [101]: 650 mg | ORAL | @ 14:00:00 | NDC 00904677361

## 2021-08-02 MED ADMIN — B COMP NO3-FOLIC-C-BIOTIN-ZINC 1-60-300-12.5 MG-MG-MCG-MG PO TAB [173534]: 1 | ORAL | @ 14:00:00 | NDC 59528031701

## 2021-08-02 MED ADMIN — ALBUTEROL SULFATE 2.5 MG /3 ML (0.083 %) IN NEBU [250]: 2.5 mg | RESPIRATORY_TRACT | @ 02:00:00 | NDC 60687039579

## 2021-08-02 MED ADMIN — ALBUTEROL SULFATE 2.5 MG /3 ML (0.083 %) IN NEBU [250]: 2.5 mg | RESPIRATORY_TRACT | @ 14:00:00 | NDC 60687039579

## 2021-08-02 MED ADMIN — OXYCODONE 5 MG PO TAB [10814]: 5 mg | ORAL | @ 15:00:00 | Stop: 2021-08-02 | NDC 00904696661

## 2021-08-02 MED ADMIN — POLYETHYLENE GLYCOL 3350 17 GRAM PO PWPK [25424]: 17 g | ORAL | @ 15:00:00 | NDC 00904693186

## 2021-08-02 MED ADMIN — POTASSIUM CHLORIDE 20 MEQ PO TBTQ [35943]: 40 meq | ORAL | @ 15:00:00 | Stop: 2021-08-02 | NDC 00832532511

## 2021-08-02 MED ADMIN — GABAPENTIN 100 MG PO CAP [18309]: 200 mg | ORAL | @ 15:00:00 | NDC 00904666561

## 2021-08-02 MED ADMIN — MAGNESIUM SULFATE IN D5W 1 GRAM/100 ML IV PGBK [166578]: 1 g | INTRAVENOUS | @ 17:00:00 | Stop: 2021-08-02 | NDC 00338170940

## 2021-08-02 MED ADMIN — ENOXAPARIN 40 MG/0.4 ML SC SYRG [85052]: 40 mg | SUBCUTANEOUS | @ 01:00:00 | NDC 00781324602

## 2021-08-02 MED ADMIN — GUAIFENESIN 100 MG/5 ML PO LIQD [79220]: 200 mg | ORAL | @ 15:00:00 | NDC 00121174405

## 2021-08-02 MED ADMIN — ALLOPURINOL 100 MG PO TAB [310]: 100 mg | ORAL | @ 14:00:00 | NDC 00904704161

## 2021-08-02 MED ADMIN — MAGNESIUM SULFATE IN D5W 1 GRAM/100 ML IV PGBK [166578]: 1 g | INTRAVENOUS | @ 15:00:00 | Stop: 2021-08-02 | NDC 44567041024

## 2021-08-02 MED ADMIN — GUAIFENESIN 100 MG/5 ML PO LIQD [79220]: 200 mg | ORAL | @ 05:00:00 | NDC 00121174405

## 2021-08-02 MED ADMIN — GABAPENTIN 100 MG PO CAP [18309]: 200 mg | ORAL | @ 01:00:00 | NDC 00904666561

## 2021-08-02 MED ADMIN — MAGNESIUM SULFATE IN D5W 1 GRAM/100 ML IV PGBK [166578]: 1 g | INTRAVENOUS | @ 14:00:00 | Stop: 2021-08-02 | NDC 00338170940

## 2021-08-02 MED ADMIN — BUMETANIDE 2 MG PO TAB [9311]: 2 mg | ORAL | @ 14:00:00 | NDC 50268013211

## 2021-08-02 MED ADMIN — BENZONATATE 100 MG PO CAP [988]: 100 mg | ORAL | @ 15:00:00 | NDC 00904715361

## 2021-08-02 MED ADMIN — SENNOSIDES-DOCUSATE SODIUM 8.6-50 MG PO TAB [40926]: 1 | ORAL | @ 01:00:00 | NDC 00536124801

## 2021-08-02 MED ADMIN — ACETAMINOPHEN 325 MG PO TAB [101]: 650 mg | ORAL | @ 21:00:00 | NDC 00904677361

## 2021-08-02 MED ADMIN — SENNOSIDES-DOCUSATE SODIUM 8.6-50 MG PO TAB [40926]: 1 | ORAL | @ 15:00:00 | NDC 00536124801

## 2021-08-02 NOTE — Telephone Encounter
Some refill protocol elements NOT Met  Medication name: metolazone  Medication Strength: 2.5 mg    Last Fill Date: 05/11/21    Abnormal labs  Comprehensive Metabolic Profile    Lab Results   Component Value Date/Time    NA 135 (L) 08/02/2021 04:12 AM    K 3.9 08/02/2021 04:12 AM    CL 94 (L) 08/02/2021 04:12 AM    CO2 31 (H) 08/02/2021 04:12 AM    GAP 10 08/02/2021 04:12 AM    BUN 43 (H) 08/02/2021 04:12 AM    CR 1.16 08/02/2021 04:12 AM    GLU 98 08/02/2021 04:12 AM    Lab Results   Component Value Date/Time    CA 8.7 08/02/2021 04:12 AM    PO4 2.7 08/02/2021 04:12 AM    ALBUMIN 2.9 (L) 08/02/2021 04:12 AM    TOTPROT 5.8 (L) 08/02/2021 04:12 AM    ALKPHOS 63 08/02/2021 04:12 AM    AST 13 08/02/2021 04:12 AM    ALT 11 08/02/2021 04:12 AM    TOTBILI 0.4 08/02/2021 04:12 AM    GFR 53 05/13/2021 12:00 AM    GFRAA >60 12/03/2019 04:51 AM        Routed to Provider

## 2021-08-03 MED ADMIN — POLYETHYLENE GLYCOL 3350 17 GRAM PO PWPK [25424]: 17 g | ORAL | @ 03:00:00 | NDC 00904693186

## 2021-08-03 MED ADMIN — GABAPENTIN 100 MG PO CAP [18309]: 400 mg | ORAL | @ 14:00:00 | NDC 00904666561

## 2021-08-03 MED ADMIN — NALOXEGOL 12.5 MG PO TAB [324272]: 25 mg | ORAL | @ 14:00:00 | Stop: 2021-08-03 | NDC 57841130001

## 2021-08-03 MED ADMIN — ALLOPURINOL 300 MG PO TAB [311]: 300 mg | ORAL | @ 14:00:00 | NDC 00904657261

## 2021-08-03 MED ADMIN — ALBUTEROL SULFATE 2.5 MG /3 ML (0.083 %) IN NEBU [250]: 2.5 mg | RESPIRATORY_TRACT | @ 13:00:00 | NDC 60687039579

## 2021-08-03 MED ADMIN — SENNOSIDES-DOCUSATE SODIUM 8.6-50 MG PO TAB [40926]: 1 | ORAL | @ 14:00:00 | NDC 00536124801

## 2021-08-03 MED ADMIN — BUMETANIDE 2 MG PO TAB [9311]: 3 mg | ORAL | @ 14:00:00 | NDC 50268013211

## 2021-08-03 MED ADMIN — ACETAMINOPHEN 325 MG PO TAB [101]: 650 mg | ORAL | @ 03:00:00 | NDC 00904677361

## 2021-08-03 MED ADMIN — HYDROCODONE-ACETAMINOPHEN 5-325 MG PO TAB [34505]: 1 | ORAL | @ 15:00:00 | NDC 00904682461

## 2021-08-03 MED ADMIN — THIAMINE MONONITRATE (VIT B1) 100 MG PO TAB [303868]: 100 mg | ORAL | @ 14:00:00 | NDC 50268085111

## 2021-08-03 MED ADMIN — METOPROLOL SUCCINATE 25 MG PO TB24 [81866]: 12.5 mg | ORAL | @ 14:00:00 | NDC 00904632261

## 2021-08-03 MED ADMIN — BUMETANIDE 1 MG PO TAB [9310]: 3 mg | ORAL | @ 14:00:00 | NDC 00904701606

## 2021-08-03 MED ADMIN — B COMP NO3-FOLIC-C-BIOTIN-ZINC 1-60-300-12.5 MG-MG-MCG-MG PO TAB [173534]: 1 | ORAL | @ 14:00:00 | NDC 59528031701

## 2021-08-03 MED ADMIN — ACETAMINOPHEN 325 MG PO TAB [101]: 650 mg | ORAL | @ 11:00:00 | Stop: 2021-08-03 | NDC 00904677361

## 2021-08-03 MED ADMIN — GABAPENTIN 100 MG PO CAP [18309]: 200 mg | ORAL | @ 03:00:00 | NDC 00904666561

## 2021-08-03 MED ADMIN — SENNOSIDES-DOCUSATE SODIUM 8.6-50 MG PO TAB [40926]: 1 | ORAL | @ 03:00:00 | NDC 00536124801

## 2021-08-03 MED ADMIN — GABAPENTIN 300 MG PO CAP [18308]: 400 mg | ORAL | @ 14:00:00 | NDC 00904666661

## 2021-08-03 MED ADMIN — ENOXAPARIN 40 MG/0.4 ML SC SYRG [85052]: 40 mg | SUBCUTANEOUS | @ 03:00:00 | NDC 00781324602

## 2021-08-03 MED ADMIN — POLYETHYLENE GLYCOL 3350 17 GRAM PO PWPK [25424]: 17 g | ORAL | @ 14:00:00 | NDC 00904693186

## 2021-08-03 MED ADMIN — ALBUTEROL SULFATE 2.5 MG /3 ML (0.083 %) IN NEBU [250]: 2.5 mg | RESPIRATORY_TRACT | @ 02:00:00 | NDC 60687039579

## 2021-08-04 ENCOUNTER — Encounter: Admit: 2021-08-04 | Discharge: 2021-08-04 | Payer: MEDICARE

## 2021-08-04 MED ADMIN — THIAMINE MONONITRATE (VIT B1) 100 MG PO TAB [303868]: 100 mg | ORAL | @ 13:00:00 | Stop: 2021-08-04 | NDC 50268085111

## 2021-08-04 MED ADMIN — GABAPENTIN 100 MG PO CAP [18309]: 400 mg | ORAL | @ 13:00:00 | Stop: 2021-08-04 | NDC 00904666561

## 2021-08-04 MED ADMIN — MAGNESIUM SULFATE IN D5W 1 GRAM/100 ML IV PGBK [166578]: 1 g | INTRAVENOUS | @ 06:00:00 | Stop: 2021-08-04 | NDC 44567041024

## 2021-08-04 MED ADMIN — HYDROCODONE-ACETAMINOPHEN 5-325 MG PO TAB [34505]: 1 | ORAL | @ 11:00:00 | Stop: 2021-08-04 | NDC 00904682461

## 2021-08-04 MED ADMIN — METOPROLOL SUCCINATE 25 MG PO TB24 [81866]: 12.5 mg | ORAL | @ 13:00:00 | Stop: 2021-08-04 | NDC 00904632261

## 2021-08-04 MED ADMIN — BUMETANIDE 2 MG PO TAB [9311]: 3 mg | ORAL | @ 13:00:00 | Stop: 2021-08-04 | NDC 50268013211

## 2021-08-04 MED ADMIN — POLYETHYLENE GLYCOL 3350 17 GRAM PO PWPK [25424]: 17 g | ORAL | @ 02:00:00 | NDC 00904693186

## 2021-08-04 MED ADMIN — GABAPENTIN 300 MG PO CAP [18308]: 400 mg | ORAL | @ 13:00:00 | Stop: 2021-08-04 | NDC 00904666661

## 2021-08-04 MED ADMIN — ENOXAPARIN 40 MG/0.4 ML SC SYRG [85052]: 40 mg | SUBCUTANEOUS | @ 02:00:00 | NDC 00781324602

## 2021-08-04 MED ADMIN — MAGNESIUM SULFATE IN D5W 1 GRAM/100 ML IV PGBK [166578]: 1 g | INTRAVENOUS | @ 10:00:00 | Stop: 2021-08-04 | NDC 44567041024

## 2021-08-04 MED ADMIN — GABAPENTIN 100 MG PO CAP [18309]: 400 mg | ORAL | @ 02:00:00 | NDC 00904666561

## 2021-08-04 MED ADMIN — GABAPENTIN 300 MG PO CAP [18308]: 400 mg | ORAL | @ 02:00:00 | NDC 00904666661

## 2021-08-04 MED ADMIN — B COMP NO3-FOLIC-C-BIOTIN-ZINC 1-60-300-12.5 MG-MG-MCG-MG PO TAB [173534]: 1 | ORAL | @ 13:00:00 | Stop: 2021-08-04 | NDC 59528031701

## 2021-08-04 MED ADMIN — HYDROCODONE-ACETAMINOPHEN 5-325 MG PO TAB [34505]: 1 | ORAL | @ 03:00:00 | NDC 00904682461

## 2021-08-04 MED ADMIN — ALBUTEROL SULFATE 2.5 MG /3 ML (0.083 %) IN NEBU [250]: 2.5 mg | RESPIRATORY_TRACT | @ 02:00:00 | NDC 60687039579

## 2021-08-04 MED ADMIN — ALBUTEROL SULFATE 2.5 MG /3 ML (0.083 %) IN NEBU [250]: 2.5 mg | RESPIRATORY_TRACT | @ 14:00:00 | Stop: 2021-08-04 | NDC 60687039579

## 2021-08-04 MED ADMIN — SENNOSIDES-DOCUSATE SODIUM 8.6-50 MG PO TAB [40926]: 1 | ORAL | @ 02:00:00 | NDC 00536124801

## 2021-08-04 MED ADMIN — ALLOPURINOL 300 MG PO TAB [311]: 300 mg | ORAL | @ 13:00:00 | Stop: 2021-08-04 | NDC 00904657261

## 2021-08-04 NOTE — Progress Notes
CM note  Patient currently involved in remote patient monitoring.   Will un-enroll from CM at this time, open to re-enroll patient if he is discharged from [facility] program

## 2021-08-05 ENCOUNTER — Encounter: Admit: 2021-08-05 | Discharge: 2021-08-05 | Payer: MEDICARE

## 2021-08-05 NOTE — Telephone Encounter
Hospital Discharge Follow Up      Reached Patient:No, transferred to Skilled Nursing Facility, [facility] for skilled nursing  Patient Date of Birth: 1940-05-10     Admission Information:     Hospital Name: [facility]  Admission Date: 07/20/2021  Discharge Date: 08/04/2021    Admission Diagnosis: Acute on chronic HFrEF (heart failure with reduced ejection fraction; Left lower extremity cellulitis  Discharge Diagnosis: (Principal) Acute on chronic HFrEF (heart failure with reduced ejection   fraction) (HCC)    OSA on CPAP    COPD (chronic obstructive pulmonary disease) (HCC)    Morbid obesity with BMI of 40.0-44.9, adult (HCC)    Essential hypertension    CAD (coronary artery disease), native coronary artery    Stage 3 chronic kidney disease (HCC)    Benign prostatic hyperplasia (BPH)    Type 2 diabetes mellitus with hyperglycemia, without long-term current   use of insulin (HCC)    Atrial fibrillation (HCC)    Acute heart failure with reduced ejection fraction and diastolic   dysfunction (HCC)    Gout    Peripheral arterial disease (HCC)    ICD (implantable cardioverter-defibrillator) discharge    Chronic respiratory failure with hypoxia (HCC)    RESOLVED: Cellulitis of left lower extremity    RESOLVED: Respiratory failure, acute-on-chronic (HCC)    RESOLVED: Shock (HCC)   Has there been a discharge within the last 30 days? No  If yes, reason: N/A  Hospital Services: Unplanned  Today's call is 1(business) days post discharge    Medication Reconciliation    Changes to pre-hospital medications? Yes    CHANGE how you take:  albuterol 0.083% (PROVENTIL)  bumetanide (BUMEX)  Miscellaneous Medical Supply  STOP taking:  benzonatate 100 mg capsule (TESSALON PERLES)  clindamycin 300 mg capsule (CLEOCIN)  nystatin 100,000 unit/g topical cream (MYCOSTATIN)  nystatin 100,000 unit/g topical ointment (MYCOSTATIN)  nystatin 100,000 unit/g topical powder (NYSTOP)  predniSONE 10 mg tablet (DELTASONE)  Were new prescriptions filled?N/A   START taking:  metoprolol succinate XL (TOPROL XL)  thiamine mononitrate (vit B1)  Meds reviewed and reconciled?Yes  ? albuterol 0.083% (PROVENTIL) 2.5 mg /3 mL (0.083 %) nebulizer solution Inhale 3 mL solution by nebulizer as directed every 6 hours as needed for Wheezing or Shortness of Breath. Indications: asthma   ? allopurinoL (ZYLOPRIM) 300 mg tablet Take one tablet by mouth daily. take with food   ? amiodarone (PACERONE) 400 mg tablet Take one tablet by mouth daily. Take with food.   ? amitr-gabapen-emu oil 05-24-08% topical cream (COMPOUND) Apply to affected areas twice daily as needed.   ? arformoteroL (BROVANA) 15 mcg/2 mL nebulizer solution Inhale 2 mL solution by nebulizer as directed twice daily. Dx: J44.9   ? ascorbic acid-ascorbate sodium 500 mg chew Chew one tablet by mouth every morning.   ? aspirin EC 81 mg tablet Take one tablet by mouth daily. Take with food.   ? atorvastatin (LIPITOR) 40 mg tablet Take 1 tablet by mouth once daily   ? azelastine (ASTELIN) 137 mcg (0.1 %) nasal spray Apply two sprays to each nostril as directed twice daily. Use in each nostril as directed   ? budesonide (PULMICORT) 0.25 mg/2 mL nebulizer solution Inhale 2 mL solution by nebulizer as directed twice daily. Dx: J44.9   ? bumetanide (BUMEX) 2 mg tablet Take 1.5 tablets by mouth daily.   ? cholecalciferol (VITAMIN D-3) 1,000 units tablet Take one tablet by mouth daily.   ?  clopiDOGreL (PLAVIX) 75 mg tablet Take one tablet by mouth daily.   ? diclofenac sodium (VOLTAREN) 1 % topical gel Apply four g topically to affected area four times daily.   ? docusate (COLACE) 100 mg capsule Take one capsule by mouth daily as needed for Constipation.   ? dulaglutide (TRULICITY) 0.75 mg/0.5 mL injection pen Inject 0.5 mL under the skin every 7 days. Indications: type 2 diabetes mellitus   ? ferrous gluconate 324 mg (37.5 mg iron) tablet Take one tablet by mouth every 48 hours. ? finasteride (PROSCAR) 5 mg tablet Take one tablet by mouth daily.   ? fish oil- omega 3-DHA/EPA 300/1,000 mg capsule Take one capsule by mouth daily.   ? flash glucose scanning reader (FREESTYLE LIBRE) reader Use as directed.   ? flash glucose sensor (FREESTYLE LIBRE 14 DAY SENSOR) sensor Type 2 diabetes  Indications: type 2 diabetes mellitus   ? gabapentin (NEURONTIN) 100 mg capsule Take four capsules by mouth twice daily.   ? guaiFENesin (ROBITUSSIN) 100 mg/5 mL oral solution Take 10 mL by mouth every 4 hours as needed.   ? HYDROcodone/acetaminophen (NORCO) 5/325 mg tablet Take one tablet by mouth every 6 hours as needed for Pain.   ? insulin pen needles (disposable) (BD ULTRA-FINE MINI PEN NEEDLE) 31 gauge x 3/16 pen needle Use with insulin pens   ? ipratropium bromide (ATROVENT) 21 mcg (0.03 %) nasal spray Apply two sprays to each nostril as directed every 12 hours.   ? magnesium oxide 400 mg magnesium capsule Take one capsule by mouth twice daily.   ? mepolizumab (NUCALA) 100 mg/mL injection pen Inject 100 mg under the skin every 28 days.   ? metOLazone (ZAROXOLYN) 2.5 mg tablet Take one tablet by mouth daily as needed. PER OUTPATIENT CARDIOLOGY PROVIDER AS NEEDED FOR FLUID RETENTION   ? metoprolol succinate XL (TOPROL XL) 25 mg extended release tablet Take one-half tablet by mouth daily.   ? Miscellaneous Medical Supply misc itted for LE gradual compression stockings at a medical supply store   ? Miscellaneous Medical Supply misc Rx: Please wrap legs with ACE bandage from toes as high up to the leg as possible bilaterally  Dx: Lymphedema   ? montelukast (SINGULAIR) 10 mg tablet Take one tablet by mouth at bedtime daily.   ? multivit-iron-FA-calcium-mins (THERA-M) 9 mg iron-400 mcg tablet Take one tablet by mouth daily.   ? nitroglycerin (NITROSTAT) 0.4 mg tablet Place one tablet under tongue every 5 minutes as needed for Chest Pain. Max of 3 tablets, call 911.   ? pantoprazole DR (PROTONIX) 40 mg tablet Take one tablet by mouth twice daily.   ? sodium chloride (HYPER-SAL) 3.5 % inhalation solution Inhale 4 mL by mouth into the lungs twice daily. 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 tamsulosin (FLOMAX) 0.4 mg capsule TAKE 1 CAPSULE BY MOUTH ONCE DAILY (DO  NOT  CRUSH,  CHEW  OR  OPEN  CAPSULES  (TAKE  30  MINUTES  FOLLOWING  THE  SAME  MEAL  EACH  DAY).   ? thiamine mononitrate (vit B1) 100 mg tablet Take one tablet by mouth daily.   ? traMADoL (ULTRAM) 50 mg tablet Take one tablet by mouth every 6 hours as needed for Pain.   ? venlafaxine XR (EFFEXOR XR) 75 mg capsule Take one capsule by mouth daily. Take with food.     Scheduling Follow-up Appointment   Upcoming appointment date and time and with whom scheduled:   Future Appointments  Date Time Provider Department Center   08/09/2021  3:30 PM Glennis Brink, APRN-NP [facility] CVM Exam   08/24/2021 10:30 AM CVM SN PACEMAKER SN[facility]M CVM Procedur   08/24/2021 11:00 AM Vanetta Shawl I, MD CV[facility]CL CVM Exam   08/29/2021 12:00 AM MAC REMOTE MONITORING MACREMOTEHRM CVM Procedur   08/30/2021  3:00 PM Raelyn Mora, PA-C [facility]   09/29/2021 12:00 AM MAC REMOTE MONITORING MACREMOTEHRM CVM Procedur   10/26/2021  9:00 AM PF LAB SCHEDULE C [id number] None   10/26/2021 10:20 AM Deveron Furlong, MD MPAPULM IM   10/31/2021 12:00 AM MAC REMOTE MONITORING MACREMOTEHRM CVM Procedur   12/01/2021 12:00 AM MAC REMOTE MONITORING MACREMOTEHRM CVM Procedur   01/02/2022 12:00 AM MAC REMOTE MONITORING MACREMOTEHRM CVM Procedur   01/24/2022  1:45 PM Donnelly Stager, MD [facility] CVM Exam   02/02/2022 12:00 AM MAC REMOTE MONITORING MACREMOTEHRM CVM Procedur   03/06/2022 12:00 AM MAC REMOTE MONITORING MACREMOTEHRM CVM Procedur   04/06/2022 12:00 AM MAC REMOTE MONITORING MACREMOTEHRM CVM Procedur   05/08/2022 12:00 AM MAC REMOTE MONITORING MACREMOTEHRM CVM Procedur   06/08/2022 12:00 AM MAC REMOTE MONITORING MACREMOTEHRM CVM Procedur     PCP appointment scheduled?No, none  PCP primary location: [facility]  Specialist appointment scheduled? Yes, with Cardiology 08/09/2021  Both PCP and Specialist appointment scheduled: No    MyChart message sent? Active in MyChart. No message sent.     Donnelly Stager, RN

## 2021-08-06 MED FILL — RXAMB AMITR-GABAPEN-EMU OIL 4-4-10% TOPICAL CREAM (COMPOUND): 25 days supply | Qty: 2 | Fill #1 | Status: AC

## 2021-08-08 ENCOUNTER — Encounter: Admit: 2021-08-08 | Discharge: 2021-08-08 | Payer: MEDICARE

## 2021-08-08 NOTE — Telephone Encounter
-----   Message from Ninfa Meeker sent at 08/08/2021  9:52 AM CDT -----  Regarding: ZUS VT with ATPx4  Remote alert presents with VT episodes treated with ATP.  Episode on 08/06/21 received ATPx1 and episode on 6/18 received ATPx4.  Tachycardia was converted.    Stored EGMs are consistent with or suggestive of Ventricular Tachycardia  Total episodes: 1  Number of ATP therapy: 2  Number of shocks delivered: 0  Most recent6/18/23 @ 7:58pm, duration 32 seconds, ATP x4 received and converted tachycardia. V rate 144 bpm.  # Stored EGMs are consistent with or suggestive of Ventricular Tachycardia  # Total episodes: 0  # Number of ATP therapy: 1 episode occurred on 08/06/21 @ 1:10pm, duration 16 seconds, V rate 144 bpm.  VT-1 zone begins at 141 bpm.  Onset and termination of episode visible.    # Number of shocks delivered: 0  Complete report sent to chart for further detail/review.  Follow up as needed.     Thank you,  Terri/Device Team

## 2021-08-08 NOTE — Telephone Encounter
pts spouse called asking if pt can change OV to televisit tomorrow. Spouse also reports that pt is currently at [facility] in swing bed and asks if OV would still be covered if he was admitted elsewhere.

## 2021-08-08 NOTE — Telephone Encounter
Called and spoke to [place], pt spouse, she reports that patient is admitted to the rehab center of the [facility] hospital. Will review with provider to see if follow up is appropriate.

## 2021-08-08 NOTE — Telephone Encounter
Called and spoke to [place], wife (EC) and she states that last night, approximately at 2000 patient "stared off, his eyes rolled back in his head, he gets stiff and his arms and hands start flinging together" she states that she "was saying Ed, Ed, Ed and he said "what, what, what?" She states that patient is currently at [facility] for Rehab. She states that they spoke to Dr. [name] (at [facility]) following the above episode. [name] states that she will discuss and inform Dr. Jason Nest regarding the above and at the interdisciplinary meeting tomorrow morning at 0800. [name] states concerns "after that episode, the nurse was more worried about getting him water than checking his vitals/blood pressure." Recc to [place] to discuss her concerns regarding this with Building control surveyor. [name] verbalizes understanding, voices much appreciation for call and denies any current questions, concerns or needs at this time.  Will route the above symptoms to Dr. Betti Cruz, Bunnie Philips, APRN, Dr. Vanetta Shawl.

## 2021-08-09 ENCOUNTER — Encounter: Admit: 2021-08-09 | Discharge: 2021-08-09 | Payer: MEDICARE

## 2021-08-09 ENCOUNTER — Inpatient Hospital Stay: Admit: 2021-08-10 | Payer: MEDICARE

## 2021-08-09 DIAGNOSIS — J449 Chronic obstructive pulmonary disease, unspecified: Secondary | ICD-10-CM

## 2021-08-09 DIAGNOSIS — T82118A Breakdown (mechanical) of other cardiac electronic device, initial encounter: Secondary | ICD-10-CM

## 2021-08-09 LAB — MAGNESIUM: MAGNESIUM: 1.6 mg/dL — ABNORMAL LOW (ref 1.6–2.6)

## 2021-08-09 LAB — COMPREHENSIVE METABOLIC PANEL
ALBUMIN: 3.2 g/dL — ABNORMAL LOW (ref 3.5–5.0)
ALK PHOSPHATASE: 64 U/L (ref 25–110)
ALT: 9 U/L (ref 7–56)
ANION GAP: 10 (ref 3–12)
AST: 15 U/L (ref 7–40)
BLD UREA NITROGEN: 44 mg/dL — ABNORMAL HIGH (ref 7–25)
CALCIUM: 8.9 mg/dL — ABNORMAL HIGH (ref 8.5–10.6)
CO2: 34 MMOL/L — ABNORMAL HIGH (ref 21–30)
CREATININE: 1.5 mg/dL — ABNORMAL HIGH (ref 0.4–1.24)
EGFR: 43 mL/min — ABNORMAL LOW (ref >60)
GLUCOSE,PANEL: 117 mg/dL — ABNORMAL HIGH (ref 70–100)
POTASSIUM: 3.2 MMOL/L — ABNORMAL LOW (ref 3.5–5.1)
SODIUM: 136 MMOL/L — ABNORMAL LOW (ref 137–147)
TOTAL BILIRUBIN: 0.5 mg/dL — ABNORMAL LOW (ref 0.3–1.2)
TOTAL PROTEIN: 6.5 g/dL (ref 6.0–8.0)

## 2021-08-09 LAB — HIGH SENSITIVITY TROPONIN I 0 HOUR: HIGH SENSITIVITY TROPONIN I 0 HOUR: 379 ng/L — ABNORMAL HIGH (ref <20)

## 2021-08-09 LAB — CBC: WBC COUNT: 7.3 K/UL (ref 4.5–11.0)

## 2021-08-09 LAB — HIGH SENSITIVITY TROPONIN I 2 HOUR: HIGH SENSITIVITY TROPONIN I 2 HOUR: 352 ng/L — ABNORMAL HIGH (ref <20)

## 2021-08-09 MED ORDER — SODIUM CHLORIDE 3 % IN NEBU
4 mL | Freq: Two times a day (BID) | RESPIRATORY_TRACT | 0 refills | Status: AC
Start: 2021-08-09 — End: ?
  Administered 2021-08-10 – 2021-08-12 (×6): 4 mL via RESPIRATORY_TRACT

## 2021-08-09 MED ORDER — METOPROLOL SUCCINATE 25 MG PO TB24
25 mg | Freq: Every day | ORAL | 0 refills | Status: AC
Start: 2021-08-09 — End: ?
  Administered 2021-08-10 – 2021-08-12 (×3): 25 mg via ORAL

## 2021-08-09 MED ORDER — ASPIRIN 81 MG PO TBEC
81 mg | Freq: Every day | ORAL | 0 refills | Status: AC
Start: 2021-08-09 — End: ?
  Administered 2021-08-10 – 2021-08-12 (×3): 81 mg via ORAL

## 2021-08-09 MED ORDER — ARFORMOTEROL 15 MCG/2 ML IN NEBU
15 ug | Freq: Two times a day (BID) | RESPIRATORY_TRACT | 0 refills | Status: AC
Start: 2021-08-09 — End: ?
  Administered 2021-08-10 – 2021-08-12 (×6): 15 ug via RESPIRATORY_TRACT

## 2021-08-09 MED ORDER — BUDESONIDE 0.25 MG/2 ML IN NBSP
.25 mg | Freq: Two times a day (BID) | RESPIRATORY_TRACT | 0 refills | Status: AC
Start: 2021-08-09 — End: ?
  Administered 2021-08-10 (×2): 0.25 mg via RESPIRATORY_TRACT

## 2021-08-09 MED ORDER — PANTOPRAZOLE 40 MG PO TBEC
40 mg | Freq: Two times a day (BID) | ORAL | 0 refills | Status: AC
Start: 2021-08-09 — End: ?
  Administered 2021-08-10 – 2021-08-12 (×6): 40 mg via ORAL

## 2021-08-09 MED ORDER — TAMSULOSIN 0.4 MG PO CAP
.4 mg | Freq: Every day | ORAL | 0 refills | Status: AC
Start: 2021-08-09 — End: ?
  Administered 2021-08-10 – 2021-08-12 (×3): 0.4 mg via ORAL

## 2021-08-09 MED ORDER — METOLAZONE 2.5 MG PO TAB
2.5 mg | Freq: Every day | ORAL | 0 refills | Status: AC
Start: 2021-08-09 — End: ?
  Administered 2021-08-10 – 2021-08-12 (×3): 2.5 mg via ORAL

## 2021-08-09 MED ORDER — ENOXAPARIN 40 MG/0.4 ML SC SYRG
40 mg | Freq: Every day | SUBCUTANEOUS | 0 refills | Status: AC
Start: 2021-08-09 — End: ?
  Administered 2021-08-12: 03:00:00 40 mg via SUBCUTANEOUS

## 2021-08-09 MED ORDER — ALBUTEROL SULFATE 2.5 MG /3 ML (0.083 %) IN NEBU
2.5 mg | Freq: Two times a day (BID) | RESPIRATORY_TRACT | 0 refills | Status: AC | PRN
Start: 2021-08-09 — End: ?
  Administered 2021-08-10 – 2021-08-12 (×5): 2.5 mg via RESPIRATORY_TRACT

## 2021-08-09 MED ORDER — AMIODARONE 200 MG PO TAB
400 mg | Freq: Every day | ORAL | 0 refills | Status: AC
Start: 2021-08-09 — End: ?
  Administered 2021-08-10 – 2021-08-11 (×2): 400 mg via ORAL

## 2021-08-09 MED ORDER — ALBUTEROL SULFATE 2.5 MG /3 ML (0.083 %) IN NEBU
2.5 mg | RESPIRATORY_TRACT | 0 refills | Status: DC | PRN
Start: 2021-08-09 — End: 2021-08-10
  Administered 2021-08-10: 03:00:00 2.5 mg via RESPIRATORY_TRACT

## 2021-08-09 MED ORDER — METOPROLOL SUCCINATE 25 MG PO TB24
12.5 mg | Freq: Every day | ORAL | 0 refills | Status: DC
Start: 2021-08-09 — End: 2021-08-10

## 2021-08-09 MED ORDER — IPRATROPIUM BROMIDE 42 MCG (0.06 %) NA SPRY
2 | Freq: Two times a day (BID) | NASAL | 0 refills | Status: AC
Start: 2021-08-09 — End: ?
  Administered 2021-08-10: 14:00:00 2 via NASAL

## 2021-08-09 MED ORDER — ALLOPURINOL 300 MG PO TAB
300 mg | Freq: Every day | ORAL | 0 refills | Status: AC
Start: 2021-08-09 — End: ?
  Administered 2021-08-10 – 2021-08-12 (×3): 300 mg via ORAL

## 2021-08-09 MED ORDER — VENLAFAXINE 75 MG PO CP24
75 mg | Freq: Every day | ORAL | 0 refills | Status: AC
Start: 2021-08-09 — End: ?
  Administered 2021-08-10 – 2021-08-12 (×3): 75 mg via ORAL

## 2021-08-09 MED ORDER — GABAPENTIN 400 MG PO CAP
400 mg | Freq: Two times a day (BID) | ORAL | 0 refills | Status: AC
Start: 2021-08-09 — End: ?
  Administered 2021-08-10 – 2021-08-12 (×6): 400 mg via ORAL

## 2021-08-09 NOTE — Progress Notes
Remote Patient Monitoring Non Adherence     Introduced self and verified patient.       Thank you, my reason for calling today is that our reports indicate that there has been a lapse in   your daily data measurements. I want to confirm your ongoing participation in the remote   patient program and address any challenges or questions you may have.     If you should have any immediate concerns or questions please call.    Spoke with pt's wife, who says pt is still in Swing Bed, but had a family meeting today that was positive and that she thinks he will go home very soon. She would like to keep RPM equipment and reactivate when he gets home. She will send a MyChart message to let us know when to reactivate the tablet.      Home Monitoring Minutes (this encounter): 8

## 2021-08-09 NOTE — Progress Notes
Daily CardioMEMS Readings    Goal PA Diastolic: 15 mmHg   PA Diastolic Thresholds: 12-17 mmHg  Notified Bunnie Philips, APRN

## 2021-08-10 ENCOUNTER — Inpatient Hospital Stay: Admit: 2021-08-10 | Discharge: 2021-08-09 | Payer: MEDICARE

## 2021-08-10 ENCOUNTER — Encounter: Admit: 2021-08-10 | Discharge: 2021-08-10 | Payer: MEDICARE

## 2021-08-10 ENCOUNTER — Inpatient Hospital Stay: Admit: 2021-08-10 | Discharge: 2021-08-10 | Payer: MEDICARE

## 2021-08-10 MED ADMIN — POTASSIUM CHLORIDE IN WATER 10 MEQ/50 ML IV PGBK [11075]: 10 meq | INTRAVENOUS | @ 12:00:00 | Stop: 2021-08-10 | NDC 00338070541

## 2021-08-10 MED ADMIN — DICLOFENAC SODIUM 1 % TP GEL [168032]: 2 g | TOPICAL | @ 23:00:00 | NDC 69097052444

## 2021-08-10 MED ADMIN — HYDROCODONE-ACETAMINOPHEN 5-325 MG PO TAB [34505]: 1 | ORAL | @ 22:00:00 | NDC 00406012323

## 2021-08-10 MED ADMIN — POTASSIUM CHLORIDE 20 MEQ PO TBTQ [35943]: 60 meq | ORAL | @ 11:00:00 | Stop: 2021-08-10 | NDC 00832532511

## 2021-08-10 MED ADMIN — POTASSIUM CHLORIDE IN WATER 10 MEQ/50 ML IV PGBK [11075]: 10 meq | INTRAVENOUS | @ 16:00:00 | Stop: 2021-08-10 | NDC 00338070541

## 2021-08-10 MED ADMIN — SODIUM CHLORIDE 0.9 % IV SOLP [27838]: 250 mL | INTRAVENOUS | @ 12:00:00 | Stop: 2021-08-10 | NDC 00338004902

## 2021-08-10 NOTE — H&P (View-Only)
Heart Failure H&P Note    NAME:Ruben Wood.                                                                   MRN: [id number]                 DOB:1940/05/30          AGE: 81 y.o.  ADMISSION DATE: 08/09/2021             DAYS ADMITTED: LOS: 0 days      Chief Complaint:  Evaluation and recommendations re: heart failure.        History of Present Illness:   81 year old male with history of ischemic cardiomyopathy s/p CRT-D placement, chronic combined systolic and diastolic heart failure, paroxysmal A-fib, OSA, COPD, type 2 diabetes who is directly admitted from postacute facility due to recurrent VT seen on device interrogation.  He has had a couple episodes of near syncope/syncope at the facility.  He reports that he has had many problems with his device since it was placed.  He reports that since he was discharged he has been progressing well at the facility and they were actually planning on discharging him home this week.  He does not report feeling any palpitations or chest pain associated with arrhythmias on the device interrogation.    Principal Problem:    ICD (implantable cardioverter-defibrillator) malfunction  Active Problems:    ICD (implantable cardioverter-defibrillator) malfunction, initial encounter      Plan Today 08/09/2021:  1. Consult electrophysiology for ICD adjustments  2. Review admission labs for electrolyte levels    Ongoing:  1. BMP once a day. Magnesium level daily.  Keep Potassium greater than 4.0 and Magnesium greater than 2.0.  2. 2000mg  sodium dietary restriction.  3. Fluid Restriction:2L/24 hours  4. Strict I/O. Goal output: net neg 1L/24 hours  5. Follow up appointment with a member of the HF team or PCP within 7 calendar days of discharge.    Assessment/Plan:   CARDIAC    #Recurrent VT with syncope  #Ischemic cardiomyopathy s/p CRT-D placement  #Chronic combined systolic and diastolic heart failure  #CAD s/p PCI  #CardioMEMS in situ  #Paroxysmal AFib  #AAA s/p repair in 2014  #HTN  --Patient follows with Dr. Sherryll Burger, LOV 05/04/2021  --09/12/2012: LHC at [facility] showed CTO of the right coronary artery, CTO of the obtuse marginal branch and high-grade stenosis of   90% in the mid left circumflex artery No significant stenosis in the left anterior descending artery or left main vessel   --04/02/2017: CTO of left circumflex, CTO of RCA s/p PCI with DES x 2, small distal coronary artery perforation.  --06/29/2017: DES placed to CTO of the circumflex artery, RCA stent patent  --05/27/2021: PCI of the left main, balloon angioplasty of the proximal left circumflex  --07/21/2021 2D ECHO done as below  --Patient also has a history of PAF for which he is currently on amiodarone. Was previously on xarelto and then Watchman device was planned. However, the patient was deemed to be very high risk for cardiac anesthesia and the procedure was canceled. Xarelto was discontinued around 06/2021 due to bleeding  --Was admitted between 07/20/2021 to 08/04/2021 with acute on chronic HFrEF and then  discharged to a swing bed  --On 08/08/2021, patient was given a telephone call because of ZUS VT with ATP on 6/17 and 6/18 consistent with VT. No shocks were delivered. Of note, the patient had a device interrogation on 06/20/2021 which showed a atrial lead low signal amplitude alarm, 0.7 mV  --The patient was then asked to get admitted for further workup and reprogramming of ICD    Intake/Output:    Goal Dry Weight: 265 lbs   Admission )    Most recent weights (inpatient): There were no vitals filed for this visit.    Diuretic Therapy Bumex daily    Prior to admission dose 3 mg   Given on admission No   Daily Dosing Based on CardioMEMs readings     GDMT PTA Changes   BB Yes -   Metoprolol succinate 12.5 mg  Continued   ACEI/ARB/ARNI  No  Hypotension    SGLT-2 Inhibitor     Aldosterone Antagonist  No Hypotension    Hydralazine/Nitrate  No (N/A - patient not Black/African American)    Ivabradine No; Not treated with maximally tolerated dose beta blockers or beta blockers contraindicated    HRMT  Yes (CRT-D)     Anticoagulation for Afib/flutter  No: Risk or history of bleeding     ?Cardiac Rehab Evaluation? for LVEF <40%       Testing/Procedures   2D ECHO 07/21/2021:  ? Moderate to severely LV systolic function 30%.  ? LV IDD 6.6 cm, LV diastolic volume indexed 148 mL/m? (severely dilated per volume).  ? Mildly dilated right ventricle, preserved RV systolic function.  RV pacer wire identified.  ? Moderate/grade 2 diastolic dysfunction.  ? Global LV hypokinesis, no focal wall motion abnormality detected.  ? At least moderate mitral valve regurgitation.  Anteriorly directed jet can lead to lesion underestimation.  ? Mild-moderate TR.  ? No pericardial effusion.  ? Visualized portions of the aortic root and ascending thoracic aorta are within normal limits.  ? CVP 3 mmHg peak PA systolic pressure 39 mmHg.  ? No LV thrombus detected.       Plan  > Admit to HF on tele  > Consult EP regarding recurrent VT  > Trend trops  >Appears euvolemic    #OSA on CPAP  #COPD  #Mobid obesity  > Continue CPAP at night  > Continue arformeterol, albuterol and ipratropium    #Type II DM  > POC glucose checks and LDCF    #CKD Stage II  > CTM    #BPH  > Continue tamsulosin. Hold finasteride.    FEN: Cardiac diet with 2L fluid restriction, replete electrolytes prn, no IVF  Dispo: Admit to HF, on tele  Code Status: Full Code  DVT ppx: SCD, hold PPx enoxaparin for EP procedure.    Patient seen and discussed with on call cardiology fellow.    Caralee Ates, MD   Internal Medicine, PGY-2  Available on Voalte    Review of Systems:  Review of Systems   Constitutional: Negative for chills, fever and weight gain.   HENT: Negative for congestion and sore throat.    Cardiovascular: Positive for dyspnea on exertion and near-syncope. Negative for chest pain, irregular heartbeat, palpitations and syncope.   Respiratory: Positive for cough. Negative for shortness of breath and wheezing.    Skin: Negative for rash.   Musculoskeletal: Negative for back pain and joint pain.   Gastrointestinal: Negative for bloating, abdominal pain, constipation, diarrhea, nausea and vomiting.  Genitourinary: Negative for dysuria and hematuria.   Neurological: Negative for focal weakness, headaches, light-headedness and weakness.         Medical History:   Diagnosis Date   ? AAA (abdominal aortic aneurysm) (HCC) 10/15/2012   ? Asthma    ? Atrial fibrillation (HCC)    ? BPH with obstruction/lower urinary tract symptoms    ? Bronchiectasis (HCC)    ? CAD (coronary artery disease), native coronary artery 08/15/2012    07/11/2002- Cath @ [facility] showed Severe double vessel disease(OMB of circumflex and distal circ)  inferior-basilar dyskinesis.                 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  09/12/12   Cath    ? Cardiomyopathy (HCC) 07/19/2014   ? Cellulitis of left lower extremity 11/12/2014    10/2014: admitted with cellulitis, [place] negative for venous clot, MRI without osteomyelitis.    11/12/2014 In clinic today, still with cellulitis.  Per patient and his wife, the erythema may be extending.  Cultures from drainage in hospital with MSSA, but Blood Cx negative.  No e/o osteomyelitis.  My concern is that this antibiotic regimen is not adequately treating his cellulitis.  We will broaden coverage to get MRSA with doxycycline.  Patient and wife given strict call/return criteria.  I will see patient in 3-4 days for re-evaluation.  No fever today.  Plan:  Stop cefpodoxime and start doxycylcine  11/17/2014 Much better, no warmth and erythema minimal.   Plan: continue doxycyline for full 10 day course.      ? Chest wall deformity 07/09/2012    Chest wall reconstruction on 07/09/12    ? CHF (congestive heart failure) (HCC)    ? 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 Chronic cough 08/29/2012   ? 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 COPD (chronic obstructive pulmonary disease) (HCC) 08/13/2012   ? Diabetes (HCC)    ? Dyspnea    ? Edema    ? GERD (gastroesophageal reflux disease) 08/13/2012   ? History of CHF (congestive heart failure) 08/13/2012   ? History of MRSA infection 10/14/2012   ? 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    ? Hypertension 08/13/2012   ? IDA (iron deficiency anemia)    ? 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 Morbid obesity (HCC) 08/13/2012   ? On supplemental oxygen therapy    ? OSA on CPAP 08/13/2012   ? Pneumonia 04/2012    [facility]   ? Pneumonia due to COVID-19 virus 12/05/2018   ? Seasonal allergic reaction      Surgical History:   Procedure Laterality Date   ? LUNG SURGERY  2015    Bio bridge in left lung/rib cage was broken  from coughing   ?  BACK SURGERY  Jan 2016   ? Right Heart Catheterization Right 11/06/2014    Performed by Cath, Physician at [facility] CATH LAB   ? CARDIAC DEFIBRILLATOR PLACEMENT  2017   ? Right Heart Catheterization With Insertion Pulmonary Artery Sensor Right 03/30/2015    Performed by Harley Alto, MD at [facility] CATH LAB   ? Insert CRT-D and Leads Right 11/25/2015    Performed by Deniece Ree, MD at [facility] EP LAB   ? Fluoroscopy N/A 11/25/2015    Performed by Deniece Ree, MD at [facility] EP LAB   ? Defibrillation Threshold Testing at ICD Implant N/A 11/25/2015    Performed by Deniece Ree, MD at [facility] EP LAB   ? VARICOSE VEIN SURGERY Left 01/05/2016    left small saphenous endovenous ablation with left leg microphlebectomy-Dr. Junita Push   ? HX MICROPHLEBECTOMY Right 07/06/2016    [facility]   ? HX ENDOVENOUS ABLATION OF THE SMALL SAPHENOUS VEIN Right 07/06/2016    [facility]   ? ANGIOGRAPHY CORONARY ARTERY WITH LEFT HEART CATHETERIZATION N/A 04/02/2017    Performed by Nat Math, MD at [facility] CATH LAB   ? PERCUTANEOUS CORONARY STENT PLACEMENT WITH ANGIOPLASTY N/A 04/02/2017    Performed by Nat Math, MD at [facility] CATH LAB   ? PERCUTANEOUS CORONARY STENT PLACEMENT WITH ANGIOPLASTY N/A 06/29/2017    Performed by Nat Math, MD at [facility] CATH LAB   ? LEFT ULNAR NERVE DECOMPRESSION AT ELBOW Left 04/30/2018    Performed by Letitia Neri, MD at [facility] OR   ? REMOVAL AND REPLACEMENT IMPLANTABLE DEFIBRILLATOR GENERATOR - MULTIPLE LEAD SYSTEM Right 11/24/2020    Performed by Annamarie Dawley, MD at [facility] EP LAB   ? PERCUTANEOUS TRANSCATHETER PLACEMENT INTRACORONARY STENT WITH/ WITHOUT ANGIOPLASTY - SINGLE MAJOR CORONARY ARTERY/ BRANCH   LT MAIN N/A 05/27/2021    Performed by Julienne Kass, MD at [facility] CATH LAB   ? BRONCHOSCOPY     ? CARDIAC CATHERIZATION  several years ago    [place]   ? DOPPLER ECHOCARDIOGRAPHY     ? HERNIA REPAIR     ? HX CHOLECYSTECTOMY     ? HX HEART CATHETERIZATION     ? HX LUMBAR LAMINECTOMY     ? HX PACEMAKER PLACEMENT       Family History   Problem Relation Age of Onset   ? Cancer Mother         liver   ? Stroke Sister    ? Cancer Father    ? Coronary Artery Disease Father    ? None Reported Sister      Social History     Socioeconomic History   ? Marital status: Married     Spouse name: [name]   ? Number of children: 0   Occupational History   ? Occupation: former Therapist, music: RETIRED     Comment: lives in a 82 year old house with wife   Tobacco Use   ? Smoking status: Former     Packs/day: 2.00     Years: 40.00     Pack years: 80.00     Types: Cigarettes     Quit date: 07/09/1998     Years since quitting: 23.1   ? Smokeless tobacco: Never   Vaping Use   ? Vaping Use: Never used   Substance and Sexual Activity   ? Alcohol use: Yes     Comment: occasion   ? Drug  use: Never   ? Sexual activity: Not Currently        Objective:  Allergies:   Allergies   Allergen Reactions   ? Chlorhexidine BLISTERS and EDEMA   ? Spironolactone HYPOTENSION   ? Jardiance [Empagliflozin] UNKNOWN     UTI   ? Levofloxacin SEE COMMENTS     Prolonged QT        Medications:  Scheduled Meds:Continuous Infusions:  PRN and Respiratory Meds:    Medications Prior to Admission   Medication Sig Dispense Refill Last Dose   ? albuterol 0.083% (PROVENTIL) 2.5 mg /3 mL (0.083 %) nebulizer solution Inhale 3 mL solution by nebulizer as directed every 6 hours as needed for Wheezing or Shortness of Breath. Indications: asthma 270 mL 11    ? allopurinoL (ZYLOPRIM) 300 mg tablet Take one tablet by mouth daily. take with food 90 tablet 3    ? amiodarone (PACERONE) 400 mg tablet Take one tablet by mouth daily. Take with food.      ? amitr-gabapen-emu oil 05-24-08% topical cream (COMPOUND) Apply to affected areas twice daily as needed. 50 g 11    ? arformoteroL (BROVANA) 15 mcg/2 mL nebulizer solution Inhale 2 mL solution by nebulizer as directed twice daily. Dx: J44.9 120 mL 11    ? ascorbic acid-ascorbate sodium 500 mg chew Chew one tablet by mouth every morning.      ? aspirin EC 81 mg tablet Take one tablet by mouth daily. Take with food.      ? atorvastatin (LIPITOR) 40 mg tablet Take 1 tablet by mouth once daily 90 tablet 1    ? azelastine (ASTELIN) 137 mcg (0.1 %) nasal spray Apply two sprays to each nostril as directed twice daily. Use in each nostril as directed 30 mL 5    ? budesonide (PULMICORT) 0.25 mg/2 mL nebulizer solution Inhale 2 mL solution by nebulizer as directed twice daily. Dx: J44.9 120 mL 11    ? bumetanide (BUMEX) 2 mg tablet Take 1.5 tablets by mouth daily. 90 tablet 0    ? cholecalciferol (VITAMIN D-3) 1,000 units tablet Take one tablet by mouth daily.      ? clopiDOGreL (PLAVIX) 75 mg tablet Take one tablet by mouth daily. 30 tablet 0    ? diclofenac sodium (VOLTAREN) 1 % topical gel Apply four g topically to affected area four times daily.      ? docusate (COLACE) 100 mg capsule Take one capsule by mouth daily as needed for Constipation.      ? dulaglutide (TRULICITY) 0.75 mg/0.5 mL injection pen Inject 0.5 mL under the skin every 7 days. Indications: type 2 diabetes mellitus 4 each 3    ? ferrous gluconate 324 mg (37.5 mg iron) tablet Take one tablet by mouth every 48 hours. 90 tablet     ? finasteride (PROSCAR) 5 mg tablet Take one tablet by mouth daily. 90 tablet 3    ? fish oil- omega 3-DHA/EPA 300/1,000 mg capsule Take one capsule by mouth daily.      ? flash glucose scanning reader (FREESTYLE LIBRE) reader Use as directed. 1 each 0    ? flash glucose sensor (FREESTYLE LIBRE 14 DAY SENSOR) sensor Type 2 diabetes  Indications: type 2 diabetes mellitus 1 each 0    ? gabapentin (NEURONTIN) 100 mg capsule Take four capsules by mouth twice daily. 90 capsule 3    ? guaiFENesin (ROBITUSSIN) 100 mg/5 mL oral solution Take 10 mL by mouth every 4 hours  as needed.  0    ? HYDROcodone/acetaminophen (NORCO) 5/325 mg tablet Take one tablet by mouth every 6 hours as needed for Pain.      ? insulin pen needles (disposable) (BD ULTRA-FINE MINI PEN NEEDLE) 31 gauge x 3/16 pen needle Use with insulin pens 300 each 3    ? ipratropium bromide (ATROVENT) 21 mcg (0.03 %) nasal spray Apply two sprays to each nostril as directed every 12 hours. 30 mL 3    ? magnesium oxide 400 mg magnesium capsule Take one capsule by mouth twice daily. 60 capsule 11    ? mepolizumab (NUCALA) 100 mg/mL injection pen Inject 100 mg under the skin every 28 days. 1 each 5    ? metOLazone (ZAROXOLYN) 2.5 mg tablet Take one tablet by mouth daily as needed. PER OUTPATIENT CARDIOLOGY PROVIDER AS NEEDED FOR FLUID RETENTION 90 tablet 0    ? metoprolol succinate XL (TOPROL XL) 25 mg extended release tablet Take one-half tablet by mouth daily. 90 tablet 0    ? Miscellaneous Medical Supply misc itted for LE gradual compression stockings at a medical supply store 1 each 1    ? Miscellaneous Medical Supply misc Rx: Please wrap legs with ACE bandage from toes as high up to the leg as possible bilaterally  Dx: Lymphedema 2 each 0    ? montelukast (SINGULAIR) 10 mg tablet Take one tablet by mouth at bedtime daily. 30 tablet 11    ? multivit-iron-FA-calcium-mins (THERA-M) 9 mg iron-400 mcg tablet Take one tablet by mouth daily.      ? nitroglycerin (NITROSTAT) 0.4 mg tablet Place one tablet under tongue every 5 minutes as needed for Chest Pain. Max of 3 tablets, call 911. 25 tablet 1    ? pantoprazole DR (PROTONIX) 40 mg tablet Take one tablet by mouth twice daily. 180 tablet 1    ? sodium chloride (HYPER-SAL) 3.5 % inhalation solution Inhale 4 mL by mouth into the lungs twice daily. Dx: J47.9 240 mL 11    ? tamsulosin (FLOMAX) 0.4 mg capsule TAKE 1 CAPSULE BY MOUTH ONCE DAILY (DO  NOT  CRUSH,  CHEW  OR  OPEN  CAPSULES  (TAKE  30  MINUTES  FOLLOWING  THE  SAME  MEAL  EACH  DAY). 90 capsule 0    ? thiamine mononitrate (vit B1) 100 mg tablet Take one tablet by mouth daily. 90 tablet     ? traMADoL (ULTRAM) 50 mg tablet Take one tablet by mouth every 6 hours as needed for Pain. 60 tablet 0    ? venlafaxine XR (EFFEXOR XR) 75 mg capsule Take one capsule by mouth daily. Take with food. 90 capsule 3                              Vital Signs:  Last Filed                Vital Signs: 24 Hour Range                  Wt Readings from Last 10 Encounters:   08/03/21 119.3 kg (263 lb 0.1 oz)   07/20/21 116.6 kg (257 lb)   07/19/21 117 kg (257 lb 15 oz)   07/11/21 117 kg (258 lb)   06/29/21 118.7 kg (261 lb 9.6 oz)   06/03/21 115.2 kg (253 lb 15.5 oz)   05/04/21 118.8 kg (262 lb)   05/04/21 118.8 kg (  262 lb)   04/26/21 117.5 kg (259 lb)   04/19/21 117.5 kg (259 lb)       Physical Exam    General: Chronically ill, no acute distress on baseline 4  NC  HEENT: Normocephalic, atraumatic, EOMI, PERRLA, MMM  Neck: Supple, No LAD, No JVD  CV: RRR, no murmurs or gallops   Chest: CTAB coarse breath sounds with large airway crackles  GI: Abdomen soft, non tender, no rebound no guarding, bowel sounds present, no hepatomegaly appreciated  Extremities: Peripheral pulses +2 in upper and lower extremities.  2+ edema BLE, chronic stasis skin changes present  Psych: Normal mood and affect, not apparently depressed  Neuro: Grossly intact    Laboratory Review:   CBC w diff    Lab Results   Component Value Date/Time    WBC 8.9 08/04/2021 05:45 AM    RBC 2.68 (L) 08/04/2021 05:45 AM    HGB 9.0 (L) 08/04/2021 05:45 AM    HCT 26.8 (L) 08/04/2021 05:45 AM    MCV 100.1 (H) 08/04/2021 05:45 AM    MCH 33.4 08/04/2021 05:45 AM    MCHC 33.4 08/04/2021 05:45 AM    RDW 19.3 (H) 08/04/2021 05:45 AM    PLTCT 294 08/04/2021 05:45 AM    MPV 7.2 08/04/2021 05:45 AM    Lab Results   Component Value Date/Time    NEUT 71 08/04/2021 05:45 AM    ANC 6.42 08/04/2021 05:45 AM    LYMA 16 (L) 08/04/2021 05:45 AM    ALC 1.42 08/04/2021 05:45 AM    MONA 11 08/04/2021 05:45 AM    AMC 0.95 (H) 08/04/2021 05:45 AM    EOSA 1 08/04/2021 05:45 AM    AEC 0.05 08/04/2021 05:45 AM    BASA 1 08/04/2021 05:45 AM    ABC 0.04 08/04/2021 05:45 AM         Chemistry    Lab Results   Component Value Date/Time    NA 136 (L) 08/04/2021 04:58 AM    K 4.2 08/04/2021 04:58 AM    CL 97 (L) 08/04/2021 04:58 AM    CO2 30 08/04/2021 04:58 AM    GAP 9 08/04/2021 04:58 AM    BUN 37 (H) 08/04/2021 04:58 AM    CR 1.10 08/04/2021 04:58 AM    GLU 94 08/04/2021 04:58 AM    Lab Results   Component Value Date/Time    CA 8.8 08/04/2021 04:58 AM    PO4 3.2 08/04/2021 04:58 AM    ALBUMIN 3.0 (L) 08/04/2021 04:58 AM    TOTPROT 5.7 (L) 08/04/2021 04:58 AM    ALKPHOS 65 08/04/2021 04:58 AM    AST 13 08/04/2021 04:58 AM    ALT 7 08/04/2021 04:58 AM    TOTBILI 0.5 08/04/2021 04:58 AM    GFR 53 05/13/2021 12:00 AM    GFRAA >60 12/03/2019 04:51 AM            Renal Function    Lab Results   Component Value Date/Time    NA 136 (L) 08/04/2021 04:58 AM    K 4.2 08/04/2021 04:58 AM    CL 97 (L) 08/04/2021 04:58 AM    CO2 30 08/04/2021 04:58 AM    GAP 9 08/04/2021 04:58 AM    BUN 37 (H) 08/04/2021 04:58 AM    BUN 40 (H) 08/03/2021 06:04 PM    BUN 42 (H) 08/03/2021 06:51 AM    Lab Results   Component Value Date/Time    CR 1.10  08/04/2021 04:58 AM    CR 1.11 08/03/2021 06:04 PM    CR 1.16 08/03/2021 06:51 AM    GLU 94 08/04/2021 04:58 AM    CA 8.8 08/04/2021 04:58 AM    PO4 3.2 08/04/2021 04:58 AM    ALBUMIN 3.0 (L) 08/04/2021 04:58 AM        Lipid Profile INR   Lab Results   Component Value Date    CHOL 161 04/04/2020    TRIG 152 (H) 04/04/2020    HDL 53 04/04/2020    LDL 87 04/04/2020    VLDL 30 04/04/2020    NONHDLCHOL 108 04/04/2020    CHOLHDLC 2.4 08/12/2018         Lab Results   Component Value Date    INR 1.0 07/20/2021          Echocardiogram Details:   Echo Results  (Last 3 results in the past 3 years)    Echo EF LVIDD LA Size IVS LVPW Rest PAP    (05/04/21)   25    (07/21/21)   6.60    (07/21/21)   5.10    (07/21/21)   1.30    (07/21/21)   1.30    (07/21/21)   39       (11/26/19)   35    (05/21/21)   6.54    (05/21/21)   5.31    (05/21/21)   1.26    (05/21/21)   1.08    (11/26/19)   37       (05/16/19)   30    (05/04/21)   6.60    (05/04/21)   4.60    (05/04/21)   1.10    (05/04/21)   1.00    (12/21/18)   37              Caralee Ates, MD   Internal Medicine, PGY-2    08/09/2021

## 2021-08-10 NOTE — Progress Notes
RT Adult Assessment Note    NAME:Ruben Wood.             MRN: [id number]             DOB:[birth year]          AGE: 81 y.o.  ADMISSION DATE: 08/09/2021             DAYS ADMITTED: LOS: 0 days    RT Treatment Plan:  Protocol Plan: Medications  Albuterol: Nebulizer BID & PRN (Home reg)  Brovana: BID  Pulmicort (Home regimen only): BID    Protocol Plan: Procedures  Oscillating PEP Therapy: BID X 24 hours, then patient administered  Oxygen/Humidity: O2 to keep SpO2 > 92%, if on room air for > 24 hours and no other RT modalities are required, then D/C protocol  CPAP/BiPAP: CPAP  SpO2: Continuous (Document SpO2 result Qshift)  Comment: Tx 0800/2000 4L home O2, NaCl BID home reg    Additional Comments:  Impressions of the patient: Patient alert, resting on 4L  Intervention(s)/outcome(s): See plan as above  Patient education that was completed: None  Recommendations to the care team: None    Vital Signs:  Pulse: 77  RR: 18 PER MINUTE  SpO2: 97 %  O2 Device: Nasal cannula  Liter Flow: 4 Lpm  O2%:    Breath Sounds: Clear (Implies normal);Decreased  Respiratory Effort: Unlabored

## 2021-08-11 MED ADMIN — MAGNESIUM SULFATE IN D5W 1 GRAM/100 ML IV PGBK [166578]: 1 g | INTRAVENOUS | @ 14:00:00 | Stop: 2021-08-11 | NDC 00338170940

## 2021-08-11 MED ADMIN — POTASSIUM CHLORIDE 20 MEQ PO TBTQ [35943]: 60 meq | ORAL | Stop: 2021-08-11 | NDC 00832532511

## 2021-08-11 MED ADMIN — BUMETANIDE 2 MG PO TAB [9311]: 2 mg | ORAL | @ 14:00:00 | NDC 50268013211

## 2021-08-11 MED ADMIN — MAGNESIUM SULFATE IN D5W 1 GRAM/100 ML IV PGBK [166578]: 1 g | INTRAVENOUS | @ 18:00:00 | Stop: 2021-08-11 | NDC 00338170940

## 2021-08-11 MED ADMIN — CLOPIDOGREL 75 MG PO TAB [78966]: 75 mg | ORAL | @ 14:00:00 | NDC 00904629461

## 2021-08-11 MED ADMIN — HYDROCODONE-ACETAMINOPHEN 5-325 MG PO TAB [34505]: 1 | ORAL | @ 15:00:00 | NDC 00406012323

## 2021-08-11 MED ADMIN — BUMETANIDE 0.25 MG/ML IJ SOLN [9308]: 2 mg | INTRAVENOUS | @ 15:00:00 | Stop: 2021-08-11 | NDC 72205010101

## 2021-08-11 MED ADMIN — CLOPIDOGREL 75 MG PO TAB [78966]: 75 mg | ORAL | @ 01:00:00 | NDC 00904629461

## 2021-08-11 MED ADMIN — POTASSIUM CHLORIDE 20 MEQ PO TBTQ [35943]: 60 meq | ORAL | @ 12:00:00 | Stop: 2021-08-11 | NDC 00832532511

## 2021-08-12 ENCOUNTER — Encounter: Admit: 2021-08-12 | Discharge: 2021-08-12 | Payer: MEDICARE

## 2021-08-12 MED ADMIN — MAGNESIUM SULFATE IN D5W 1 GRAM/100 ML IV PGBK [166578]: 1 g | INTRAVENOUS | @ 13:00:00 | Stop: 2021-08-12 | NDC 00338170940

## 2021-08-12 MED ADMIN — AMIODARONE 200 MG PO TAB [9066]: 400 mg | ORAL | @ 13:00:00 | Stop: 2021-08-12 | NDC 00904699361

## 2021-08-12 MED ADMIN — MAGNESIUM SULFATE IN D5W 1 GRAM/100 ML IV PGBK [166578]: 1 g | INTRAVENOUS | @ 18:00:00 | Stop: 2021-08-12 | NDC 00338170940

## 2021-08-12 MED ADMIN — AMIODARONE 200 MG PO TAB [9066]: 400 mg | ORAL | @ 03:00:00 | NDC 00904699361

## 2021-08-12 MED ADMIN — HYDROCODONE-ACETAMINOPHEN 5-325 MG PO TAB [34505]: 1 | ORAL | @ 14:00:00 | Stop: 2021-08-12 | NDC 00406012323

## 2021-08-12 MED ADMIN — POTASSIUM CHLORIDE 20 MEQ PO TBTQ [35943]: 40 meq | ORAL | @ 13:00:00 | Stop: 2021-08-12 | NDC 00832532511

## 2021-08-12 MED ADMIN — CLOPIDOGREL 75 MG PO TAB [78966]: 75 mg | ORAL | @ 13:00:00 | Stop: 2021-08-12 | NDC 00904629461

## 2021-08-12 MED ADMIN — BUDESONIDE 0.25 MG/2 ML IN NBSP [81321]: 0.25 mg | RESPIRATORY_TRACT | @ 19:00:00 | Stop: 2021-08-12 | NDC 00093681519

## 2021-08-12 MED ADMIN — MELATONIN 5 MG PO TAB [168576]: 5 mg | ORAL | @ 03:00:00 | NDC 77333052025

## 2021-08-12 MED ADMIN — BUMETANIDE 2 MG PO TAB [9311]: 3 mg | ORAL | @ 13:00:00 | Stop: 2021-08-12 | NDC 50268013211

## 2021-08-12 MED ADMIN — ATORVASTATIN 40 MG PO TAB [77113]: 40 mg | ORAL | @ 16:00:00 | Stop: 2021-08-12 | NDC 00904629261

## 2021-08-12 MED ADMIN — MEXILETINE 150 MG PO CAP [10595]: 150 mg | ORAL | @ 13:00:00 | Stop: 2021-08-12 | NDC 00093873901

## 2021-08-12 MED ADMIN — MEXILETINE 150 MG PO CAP [10595]: 150 mg | ORAL | @ 03:00:00 | NDC 00093873901

## 2021-08-12 MED ADMIN — MEXILETINE 150 MG PO CAP [10595]: 150 mg | ORAL | @ 19:00:00 | Stop: 2021-08-12 | NDC 00093873901

## 2021-08-12 MED ADMIN — BUMETANIDE 0.25 MG/ML IJ SOLN [9308]: 3 mg | INTRAVENOUS | @ 19:00:00 | Stop: 2021-08-12 | NDC 72205010101

## 2021-08-13 ENCOUNTER — Encounter: Admit: 2021-08-13 | Discharge: 2021-08-13 | Payer: MEDICARE

## 2021-08-15 ENCOUNTER — Encounter: Admit: 2021-08-15 | Discharge: 2021-08-15 | Payer: MEDICARE

## 2021-08-15 NOTE — Telephone Encounter
VT event occurred on 08/12/21 at 1:25pm. Patient was still admitted to [facility] during this time. During admission patient's Amiodarone was increased to 400mg  twice per day and started on Mexiletine 150mg  TID. Notified YMR.

## 2021-08-15 NOTE — Telephone Encounter
RN sent Teams message to Ruben Wood, and clinic MAs, to alert them to patient added to tomorrow's Injection Clinic in case Nucala needs picked up from Pharmacy prior.

## 2021-08-15 NOTE — Telephone Encounter
-----   Message from Kelli Churn, RN sent at 08/15/2021  7:44 AM CDT -----  Regarding: YMR-VT/VF episode with therapy  Good morning, Alert received on 6/23/ and 6/24 for VT Episode with successful ATPx2 delivered    VT/VF episode on 08/12/20 with x2ATP, no shock delivered successfully terminating arrhythmia to SR. Please follow up as needed. Patient was seen by [facility] on 02/24/21. He was just discharge to home on 08/12/21.     Thanks,   [facility]-Device Team

## 2021-08-16 ENCOUNTER — Encounter: Admit: 2021-08-16 | Discharge: 2021-08-16 | Payer: MEDICARE

## 2021-08-16 ENCOUNTER — Ambulatory Visit: Admit: 2021-08-16 | Discharge: 2021-08-16 | Payer: MEDICARE

## 2021-08-16 DIAGNOSIS — J4541 Moderate persistent asthma with (acute) exacerbation: Secondary | ICD-10-CM

## 2021-08-16 MED ORDER — MEPOLIZUMAB 100 MG SC SOLR
100 mg | Freq: Once | SUBCUTANEOUS | 0 refills | Status: CP
Start: 2021-08-16 — End: ?
  Administered 2021-08-16: 18:00:00 100 mg via SUBCUTANEOUS

## 2021-08-16 MED FILL — NUCALA 100 MG/ML SC ATIN: 100 mg/mL | SUBCUTANEOUS | 28 days supply | Qty: 1 | Fill #1 | Status: CP

## 2021-08-16 MED FILL — SODIUM CHLORIDE 3.5 % IN NEBU: 3.5 % | RESPIRATORY_TRACT | 30 days supply | Qty: 240 | Fill #4 | Status: CP

## 2021-08-16 MED FILL — ARFORMOTEROL 15 MCG/2 ML IN NEBU: 15 mcg/2 mL | 30 days supply | Qty: 120 | Fill #4 | Status: CP

## 2021-08-16 MED FILL — ALBUTEROL SULFATE 2.5 MG /3 ML (0.083 %) IN NEBU: 2.5 mg /3 mL (0.083 %) | 19 days supply | Qty: 225 | Fill #4 | Status: CP

## 2021-08-16 NOTE — Telephone Encounter
Per YMR. No additional recommendations from EP standpoint. Continue same antiarrhythmic medication dosages. Will continue to monitor.

## 2021-08-16 NOTE — Telephone Encounter
Spoke with patient's wife, Ruben Wood. Ruben Wood states she knew he got shocked. Patient briefly lost consciousness, but regained consciousness shortly after. When patient awoke, wife wanted to contact EMS but patient refused. Wife states his pulse was 71bpm and and oxygen 95%. Again patient refused Emergency Services.     This morning patient states he is feeling fine/normal. Denies any abnormal cardiovascular symptoms at this time.     Patient recently admitted to [facility] for ventricular arrhythmias and discharged on 08/12/21. Amiodarone was increased to 456m BID and started on mexiletine 152mTID. Wife states there was a delay after discharge in obtaining amiodarone 40039mablets from local pharmacy. States he got his first 400m10mse last night. Wife states patient has not missed any doses of mexiletine since discharge.     Verified medications with wife. Wife states patient has only been taking Mag oxide 400mg74me daily instead of prescribed BID. Wife states she wasn't sure if the magnesium would be absorbed due to all his other medications.     Notified [facility] and Dr. Shah.Manuella Ghazi  CMP result 08/12/21:    Sodium  137 - 147 MMOL/L 135Low    Potassium  3.5 - 5.1 MMOL/L 3.6    Chloride  98 - 110 MMOL/L 94Low    Glucose  70 - 100 MG/DL 103High    Blood Urea Nitrogen  7 - 25 MG/DL 45High    Creatinine  0.4 - 1.24 MG/DL 1.50High    Calcium  8.5 - 10.6 MG/DL 8.6    Total Protein  6.0 - 8.0 G/DL 5.9Low    Total Bilirubin  0.3 - 1.2 MG/DL 0.5    Albumin  3.5 - 5.0 G/DL 2.9Low    Alk Phosphatase  25 - 110 U/L 61    AST (SGOT)  7 - 40 U/L 12    CO2  21 - 30 MMOL/L 33High    ALT (SGPT)  7 - 56 U/L 7    Anion Gap  3 - 12 8    eGFR  >60 mL/min 46         Magnesium result 08/12/21:    Magnesium  1.6 - 2.6 mg/dL 1.5Low

## 2021-08-16 NOTE — Progress Notes
Pt waited 30 minutes post injection in waiting room. Pt tolerated injection with no adverse reactions. Pt free to leave.

## 2021-08-16 NOTE — Progress Notes
Patient given 100mg /mL of Nucala to RA. No reactions noted, patient tolerated well. Patient waited in waiting room post injection per protocol.   Please be advised, RN notified.  MPS,MA

## 2021-08-17 ENCOUNTER — Encounter: Admit: 2021-08-17 | Discharge: 2021-08-17 | Payer: MEDICARE

## 2021-08-17 MED FILL — BUDESONIDE 0.25 MG/2 ML IN NBSP: 0.25 mg/2 mL | 15 days supply | Qty: 60 | Fill #5 | Status: AC

## 2021-08-18 ENCOUNTER — Inpatient Hospital Stay: Admit: 2021-08-18 | Payer: MEDICARE

## 2021-08-18 ENCOUNTER — Ambulatory Visit: Admit: 2021-08-18 | Discharge: 2021-08-19 | Payer: MEDICARE

## 2021-08-18 ENCOUNTER — Inpatient Hospital Stay: Admit: 2021-08-18 | Discharge: 2021-08-18 | Payer: MEDICARE

## 2021-08-18 ENCOUNTER — Encounter: Admit: 2021-08-18 | Discharge: 2021-08-18 | Payer: MEDICARE

## 2021-08-18 DIAGNOSIS — I472 Ventricular tachycardia (HCC): Secondary | ICD-10-CM

## 2021-08-18 DIAGNOSIS — I5042 Chronic combined systolic (congestive) and diastolic (congestive) heart failure: Secondary | ICD-10-CM

## 2021-08-18 DIAGNOSIS — I2510 Atherosclerotic heart disease of native coronary artery without angina pectoris: Secondary | ICD-10-CM

## 2021-08-18 DIAGNOSIS — Z95810 Presence of automatic (implantable) cardiac defibrillator: Secondary | ICD-10-CM

## 2021-08-18 LAB — COMPREHENSIVE METABOLIC PANEL
ALBUMIN: 3.5 g/dL — ABNORMAL HIGH (ref 3.5–5.0)
ALK PHOSPHATASE: 70 U/L — ABNORMAL LOW (ref 25–110)
ALT: 9 U/L (ref 7–56)
ANION GAP: 14 K/UL — ABNORMAL HIGH (ref 3–12)
AST: 22 U/L (ref 7–40)
CALCIUM: 9.3 mg/dL — ABNORMAL HIGH (ref 8.5–10.6)
CO2: 31 MMOL/L — ABNORMAL HIGH (ref 21–30)
CREATININE: 1.5 mg/dL — ABNORMAL HIGH (ref 0.4–1.24)
EGFR: 46 mL/min — ABNORMAL LOW (ref >60)
GLUCOSE,PANEL: 90 mg/dL (ref 70–100)
SODIUM: 137 MMOL/L — ABNORMAL LOW (ref 137–147)
TOTAL BILIRUBIN: 0.7 mg/dL (ref 0.3–1.2)

## 2021-08-18 LAB — CBC AND DIFF
ABSOLUTE BASO COUNT: 0 K/UL (ref 0–0.20)
ABSOLUTE EOS COUNT: 0 K/UL (ref 0–0.45)
ABSOLUTE MONO COUNT: 0.7 K/UL (ref 0–0.80)
WBC COUNT: 7.1 K/UL (ref 4.5–11.0)

## 2021-08-18 LAB — POC GLUCOSE
POC GLUCOSE: 109 mg/dL — ABNORMAL HIGH (ref 70–100)
POC GLUCOSE: 118 mg/dL — ABNORMAL HIGH (ref 70–100)

## 2021-08-18 LAB — HIGH SENSITIVITY TROPONIN I 2 HOUR
HIGH SENSITIVITY TROPONIN I 2 HOUR: 142 ng/L — ABNORMAL HIGH (ref <20)
HIGH SENSITIVITY TROPONIN I DELTA VALUE: 17

## 2021-08-18 LAB — TSH WITH FREE T4 REFLEX: TSH: 4.4 uU/mL — ABNORMAL HIGH (ref 0.35–5.00)

## 2021-08-18 LAB — MAGNESIUM: MAGNESIUM: 1.8 mg/dL — ABNORMAL LOW (ref 1.6–2.6)

## 2021-08-18 LAB — PHOSPHORUS: PHOSPHORUS: 2.5 mg/dL — ABNORMAL LOW (ref 2.0–4.5)

## 2021-08-18 LAB — HIGH SENSITIVITY TROPONIN I 0 HOUR: HIGH SENSITIVITY TROPONIN I 0 HOUR: 125 ng/L — ABNORMAL HIGH (ref <20)

## 2021-08-18 MED ORDER — MINERAL OIL-HYDROPHIL PETROLAT TP OINT
Freq: Every day | TOPICAL | 0 refills | Status: AC
Start: 2021-08-18 — End: ?
  Administered 2021-08-18: via TOPICAL

## 2021-08-18 MED ORDER — MEXILETINE 150 MG PO CAP
150 mg | Freq: Three times a day (TID) | ORAL | 0 refills | Status: AC
Start: 2021-08-18 — End: ?
  Administered 2021-08-18 – 2021-08-20 (×6): 150 mg via ORAL

## 2021-08-18 MED ORDER — MAGNESIUM SULFATE IN D5W 1 GRAM/100 ML IV PGBK
1 g | INTRAVENOUS | 0 refills | Status: AC
Start: 2021-08-18 — End: ?
  Administered 2021-08-19: 07:00:00 1 g via INTRAVENOUS

## 2021-08-18 MED ORDER — ENOXAPARIN 40 MG/0.4 ML SC SYRG
40 mg | Freq: Every day | SUBCUTANEOUS | 0 refills | Status: AC
Start: 2021-08-18 — End: ?
  Administered 2021-08-19 – 2021-08-21 (×3): 40 mg via SUBCUTANEOUS

## 2021-08-18 MED ORDER — GABAPENTIN 300 MG PO CAP
300 mg | Freq: Two times a day (BID) | ORAL | 0 refills | Status: AC
Start: 2021-08-18 — End: ?
  Administered 2021-08-19 – 2021-08-21 (×6): 300 mg via ORAL

## 2021-08-18 MED ORDER — CLOPIDOGREL 75 MG PO TAB
75 mg | Freq: Every day | ORAL | 0 refills | Status: AC
Start: 2021-08-18 — End: ?
  Administered 2021-08-19 – 2021-08-21 (×3): 75 mg via ORAL

## 2021-08-18 MED ORDER — INSULIN ASPART 100 UNIT/ML SC FLEXPEN
0-6 [IU] | Freq: Before meals | SUBCUTANEOUS | 0 refills | Status: AC
Start: 2021-08-18 — End: ?

## 2021-08-18 MED ORDER — ASPIRIN 81 MG PO TBEC
81 mg | Freq: Once | ORAL | 0 refills | Status: CP
Start: 2021-08-18 — End: ?
  Administered 2021-08-19: 02:00:00 81 mg via ORAL

## 2021-08-18 MED ORDER — ASPIRIN 81 MG PO TBEC
81 mg | Freq: Every day | ORAL | 0 refills | Status: AC
Start: 2021-08-18 — End: ?
  Administered 2021-08-19 – 2021-08-21 (×3): 81 mg via ORAL

## 2021-08-18 MED ORDER — AMIODARONE IN DEXTROSE,ISO-OSM 360 MG/200 ML (1.8 MG/ML) IV SOLN
.5 mg/min | INTRAVENOUS | 0 refills | Status: AC
Start: 2021-08-18 — End: ?
  Administered 2021-08-19 (×2): 0.5 mg/min via INTRAVENOUS

## 2021-08-18 MED ORDER — THIAMINE MONONITRATE (VIT B1) 100 MG PO TAB
100 mg | Freq: Every day | ORAL | 0 refills | Status: AC
Start: 2021-08-18 — End: ?
  Administered 2021-08-19 – 2021-08-21 (×3): 100 mg via ORAL

## 2021-08-18 MED ORDER — DEXTROSE 50 % IN WATER (D50W) IV SYRG
12.5-25 g | INTRAVENOUS | 0 refills | Status: AC | PRN
Start: 2021-08-18 — End: ?

## 2021-08-18 MED ORDER — MAGNESIUM SULFATE IN D5W 1 GRAM/100 ML IV PGBK
1 g | INTRAVENOUS | 0 refills | Status: AC
Start: 2021-08-18 — End: ?
  Administered 2021-08-19: 02:00:00 1 g via INTRAVENOUS

## 2021-08-18 MED ORDER — AMIODARONE 200 MG PO TAB
400 mg | Freq: Two times a day (BID) | ORAL | 0 refills | Status: DC
Start: 2021-08-18 — End: 2021-08-18

## 2021-08-18 MED ORDER — AMIODARONE 150 MG IN D5W 100 ML IVPB LOAD
150 mg | Freq: Once | INTRAVENOUS | 0 refills | Status: CP
Start: 2021-08-18 — End: ?
  Administered 2021-08-19 (×2): 150 mg via INTRAVENOUS

## 2021-08-18 MED ORDER — TRAMADOL 50 MG PO TAB
50 mg | ORAL | 0 refills | Status: AC | PRN
Start: 2021-08-18 — End: ?
  Administered 2021-08-19 – 2021-08-21 (×5): 50 mg via ORAL

## 2021-08-18 MED ORDER — ALLOPURINOL 300 MG PO TAB
300 mg | Freq: Every day | ORAL | 0 refills | Status: AC
Start: 2021-08-18 — End: ?
  Administered 2021-08-19 – 2021-08-21 (×3): 300 mg via ORAL

## 2021-08-18 MED ORDER — TAMSULOSIN 0.4 MG PO CAP
.4 mg | Freq: Every day | ORAL | 0 refills | Status: AC
Start: 2021-08-18 — End: ?
  Administered 2021-08-19 – 2021-08-21 (×3): 0.4 mg via ORAL

## 2021-08-18 MED ORDER — DICLOFENAC SODIUM 1 % TP GEL
2 g | Freq: Four times a day (QID) | TOPICAL | 0 refills | Status: AC
Start: 2021-08-18 — End: ?
  Administered 2021-08-19: 15:00:00 2 g via TOPICAL

## 2021-08-18 MED ORDER — BUMETANIDE 0.25 MG/ML IJ SOLN
3 mg | Freq: Two times a day (BID) | INTRAVENOUS | 0 refills | Status: AC
Start: 2021-08-18 — End: ?
  Administered 2021-08-18 – 2021-08-19 (×2): 3 mg via INTRAVENOUS

## 2021-08-18 MED ORDER — ALUMINUM-MAGNESIUM HYDROXIDE 200-200 MG/5 ML PO SUSP
30 mL | ORAL | 0 refills | Status: AC | PRN
Start: 2021-08-18 — End: ?
  Administered 2021-08-19 (×2): 30 mL via ORAL

## 2021-08-18 MED ORDER — TRAZODONE 50 MG PO TAB
50 mg | Freq: Every evening | ORAL | 0 refills | Status: AC | PRN
Start: 2021-08-18 — End: ?
  Administered 2021-08-19 – 2021-08-21 (×3): 50 mg via ORAL

## 2021-08-18 MED ORDER — CLOPIDOGREL 75 MG PO TAB
75 mg | Freq: Once | ORAL | 0 refills | Status: CP
Start: 2021-08-18 — End: ?
  Administered 2021-08-19: 02:00:00 75 mg via ORAL

## 2021-08-18 MED ORDER — AMIODARONE IN DEXTROSE,ISO-OSM 360 MG/200 ML (1.8 MG/ML) IV SOLN
1 mg/min | INTRAVENOUS | 0 refills | Status: AC
Start: 2021-08-18 — End: ?
  Administered 2021-08-19: 02:00:00 1 mg/min via INTRAVENOUS

## 2021-08-18 MED ORDER — MONTELUKAST 10 MG PO TAB
10 mg | Freq: Every evening | ORAL | 0 refills | Status: AC
Start: 2021-08-18 — End: ?
  Administered 2021-08-19 – 2021-08-21 (×3): 10 mg via ORAL

## 2021-08-18 MED ORDER — DICLOFENAC SODIUM 1 % TP GEL
2 g | Freq: Four times a day (QID) | TOPICAL | 0 refills | PRN
Start: 2021-08-18 — End: ?

## 2021-08-18 MED ORDER — AMITRIPTYLINE(#)/GABAPENTIN/BACLOFEN 2/6/2% TOPICAL CRM
Freq: Two times a day (BID) | TOPICAL | 0 refills
Start: 2021-08-18 — End: ?

## 2021-08-18 MED ORDER — FINASTERIDE 5 MG PO TAB
5 mg | Freq: Every day | ORAL | 0 refills | Status: DC
Start: 2021-08-18 — End: 2021-08-18

## 2021-08-18 MED ORDER — BUMETANIDE 2 MG PO TAB
3 mg | Freq: Two times a day (BID) | ORAL | 0 refills | Status: DC
Start: 2021-08-18 — End: 2021-08-18

## 2021-08-18 MED ORDER — IPRATROPIUM BROMIDE 42 MCG (0.06 %) NA SPRY
2 | Freq: Two times a day (BID) | NASAL | 0 refills | Status: AC
Start: 2021-08-18 — End: ?
  Administered 2021-08-19: 15:00:00 2 via NASAL

## 2021-08-18 MED ORDER — NITROGLYCERIN 0.4 MG SL SUBL
.4 mg | SUBLINGUAL | 0 refills | Status: AC | PRN
Start: 2021-08-18 — End: ?

## 2021-08-18 MED ORDER — VENLAFAXINE 75 MG PO CP24
75 mg | Freq: Every day | ORAL | 0 refills | Status: AC
Start: 2021-08-18 — End: ?
  Administered 2021-08-19 – 2021-08-21 (×3): 75 mg via ORAL

## 2021-08-18 MED ORDER — PANTOPRAZOLE 40 MG PO TBEC
40 mg | Freq: Two times a day (BID) | ORAL | 0 refills | Status: AC
Start: 2021-08-18 — End: ?
  Administered 2021-08-19 – 2021-08-21 (×6): 40 mg via ORAL

## 2021-08-18 MED ORDER — BUDESONIDE 0.25 MG/2 ML IN NBSP
.25 mg | Freq: Two times a day (BID) | RESPIRATORY_TRACT | 0 refills | Status: AC
Start: 2021-08-18 — End: ?
  Administered 2021-08-19 – 2021-08-21 (×6): 0.25 mg via RESPIRATORY_TRACT

## 2021-08-18 MED ORDER — ALBUTEROL SULFATE 2.5 MG /3 ML (0.083 %) IN NEBU
2.5 mg | RESPIRATORY_TRACT | 0 refills | Status: AC | PRN
Start: 2021-08-18 — End: ?

## 2021-08-18 MED ORDER — METOPROLOL SUCCINATE 25 MG PO TB24
12.5 mg | Freq: Every day | ORAL | 0 refills | Status: AC
Start: 2021-08-18 — End: ?
  Administered 2021-08-19 – 2021-08-21 (×3): 12.5 mg via ORAL

## 2021-08-18 MED ORDER — AMITRIPTYLINE(#)/GABAPENTIN/BACLOFEN 2/6/2% TOPICAL CRM
TOPICAL | 0 refills | Status: AC
Start: 2021-08-18 — End: ?
  Administered 2021-08-19 (×2): via TOPICAL

## 2021-08-18 MED ORDER — ATORVASTATIN 40 MG PO TAB
40 mg | Freq: Every day | ORAL | 0 refills | Status: AC
Start: 2021-08-18 — End: ?
  Administered 2021-08-19 – 2021-08-21 (×3): 40 mg via ORAL

## 2021-08-18 MED ORDER — GABAPENTIN 400 MG PO CAP
400 mg | Freq: Two times a day (BID) | ORAL | 0 refills | Status: DC
Start: 2021-08-18 — End: 2021-08-18

## 2021-08-18 MED ORDER — ARFORMOTEROL 15 MCG/2 ML IN NEBU
15 ug | Freq: Two times a day (BID) | RESPIRATORY_TRACT | 0 refills | Status: AC
Start: 2021-08-18 — End: ?
  Administered 2021-08-19 – 2021-08-21 (×6): 15 ug via RESPIRATORY_TRACT

## 2021-08-18 MED ORDER — ACETAMINOPHEN 500 MG PO TAB
500 mg | ORAL | 0 refills | Status: AC | PRN
Start: 2021-08-18 — End: ?

## 2021-08-18 MED ORDER — MAGNESIUM OXIDE 400 MG (241.3 MG MAGNESIUM) PO TAB
400 mg | Freq: Every day | ORAL | 0 refills | Status: AC
Start: 2021-08-18 — End: ?
  Administered 2021-08-18 – 2021-08-21 (×4): 400 mg via ORAL

## 2021-08-18 NOTE — Progress Notes
History of Present Illness  Ruben Wood. is a 81 y.o. male with a history of obesity, combined systolic and diastolic heart failure, coronary artery disease, type 2 diabetes, COPD/asthma with chronic hypoxic respiratory failure and bronchiectasis, OSA, CAD, hyperlipidemia is here today for hospital follow up.    Ruben Wood was recently admitted from 08/09/2021 until 08/12/2021.  He was admitted because while he was at the subacute rehab facility in [facility], he had an episode of near syncope.  His ICD interrogation showed that he was having ventricular tachycardia.  While he was inpatient here at [facility], he had his amiodarone increased to 400 mg twice daily.  He was also started on mexiletine.  He was advised to start magnesium.  He is now back at home.  On 2 occasions in the middle of our telehealth visit, had lost consciousness and it appeared to me that he had 1 ICD shock.  He was able to recover afterwards and was able to communicate with me, but he did have confusion about the situation around him.  We called 911 in the middle the telehealth visits and I stayed on the line with him until EMS arrived was able to give handoff to them.  Ruben Wood also endorsed having some heartburn symptoms over the past day or so.  He describes as a burning sensation in the center part of his chest.       Review of Systems  Per HPI    Objective:         ? albuterol 0.083% (PROVENTIL) 2.5 mg /3 mL (0.083 %) nebulizer solution Inhale 3 mL solution by nebulizer as directed every 6 hours as needed for Wheezing or Shortness of Breath. Indications: asthma   ? allopurinoL (ZYLOPRIM) 300 mg tablet Take one tablet by mouth daily. take with food   ? amiodarone (PACERONE) 400 mg tablet Take 400 mg BID until follow up with EP   ? amitr-gabapen-emu oil 05-24-08% topical cream (COMPOUND) Apply to affected areas twice daily as needed.   ? arformoteroL (BROVANA) 15 mcg/2 mL nebulizer solution Inhale 2 mL solution by nebulizer as directed twice daily. Dx: J44.9   ? ascorbic acid (vitamin C) 500 mg tablet Take one tablet by mouth daily.   ? ascorbic acid-ascorbate sodium 500 mg chew Chew one tablet by mouth every morning.   ? aspirin EC 81 mg tablet Take one tablet by mouth daily. Take with food.   ? atorvastatin (LIPITOR) 40 mg tablet Take 1 tablet by mouth once daily   ? azelastine (ASTELIN) 137 mcg (0.1 %) nasal spray Apply two sprays to each nostril as directed twice daily. Use in each nostril as directed   ? benzonatate (TESSALON PERLES) 100 mg capsule Take one capsule by mouth every 6 hours as needed for Cough.   ? budesonide (PULMICORT) 0.25 mg/2 mL nebulizer solution Inhale 2 mL solution by nebulizer as directed twice daily. Dx: J44.9   ? bumetanide (BUMEX) 1 mg tablet Take with 2 mg tablet twice daily to make a total of 3 mg.   ? bumetanide (BUMEX) 2 mg tablet Take one tablet twice daily. Take along with 1 mg tablet for a total of 3 mg twice daily.   ? cholecalciferol (VITAMIN D-3) 1,000 units tablet Take one tablet by mouth daily.   ? clopiDOGreL (PLAVIX) 75 mg tablet Take one tablet by mouth daily.   ? diclofenac sodium (VOLTAREN) 1 % topical gel Apply four g topically to affected area four times daily.   ?  DOCOSAHEXAENOIC ACID-EPA PO Take  by mouth daily.   ? docusate (COLACE) 100 mg capsule Take one capsule by mouth daily as needed for Constipation.   ? dulaglutide (TRULICITY) 0.75 mg/0.5 mL injection pen Inject 0.5 mL under the skin every 7 days. Indications: type 2 diabetes mellitus   ? ferrous gluconate 324 mg (37.5 mg iron) tablet Take one tablet by mouth every 48 hours.   ? finasteride (PROSCAR) 5 mg tablet Take one tablet by mouth daily.   ? fish oil- omega 3-DHA/EPA 300/1,000 mg capsule Take one capsule by mouth daily.   ? flash glucose scanning reader (FREESTYLE LIBRE) reader Use as directed.   ? flash glucose sensor (FREESTYLE LIBRE 14 DAY SENSOR) sensor Type 2 diabetes  Indications: type 2 diabetes mellitus   ? gabapentin (NEURONTIN) 100 mg capsule Take four capsules by mouth twice daily.   ? guaiFENesin (ROBITUSSIN) 100 mg/5 mL oral solution Take 10 mL by mouth every 4 hours as needed.   ? HYDROcodone/acetaminophen (NORCO) 5/325 mg tablet Take one tablet by mouth every 6 hours as needed for Pain.   ? insulin pen needles (disposable) (BD ULTRA-FINE MINI PEN NEEDLE) 31 gauge x 3/16 pen needle Use with insulin pens   ? ipratropium bromide (ATROVENT) 21 mcg (0.03 %) nasal spray Apply two sprays to each nostril as directed every 12 hours.   ? magnesium oxide 400 mg magnesium capsule Take one capsule by mouth twice daily.   ? mepolizumab (NUCALA) 100 mg/mL injection pen Inject 100 mg under the skin every 28 days.   ? metOLazone (ZAROXOLYN) 2.5 mg tablet Take one tablet by mouth daily as needed. PER OUTPATIENT CARDIOLOGY PROVIDER AS NEEDED FOR FLUID RETENTION   ? metoprolol succinate XL (TOPROL XL) 25 mg extended release tablet Take one-half tablet by mouth daily.   ? mexiletine (MEXITIL) 150 mg capsule Take 150 mg three times daily until follow up with EP  Indications: ventricular arrhythmias, a type of abnormal heart rhythm   ? Miscellaneous Medical Supply misc itted for LE gradual compression stockings at a medical supply store   ? Miscellaneous Medical Supply misc Rx: Please wrap legs with ACE bandage from toes as high up to the leg as possible bilaterally  Dx: Lymphedema   ? montelukast (SINGULAIR) 10 mg tablet Take one tablet by mouth at bedtime daily.   ? multivit-iron-FA-calcium-mins (THERA-M) 9 mg iron-400 mcg tablet Take one tablet by mouth daily.   ? nitroglycerin (NITROSTAT) 0.4 mg tablet Place one tablet under tongue every 5 minutes as needed for Chest Pain. Max of 3 tablets, call 911.   ? pantoprazole DR (PROTONIX) 40 mg tablet Take one tablet by mouth twice daily.   ? polyethylene glycol 3350 (MIRALAX) 17 g packet Take one packet by mouth daily as needed.   ? sodium chloride (HYPER-SAL) 3.5 % inhalation solution Inhale 4 mL by mouth into the lungs twice daily. 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 tamsulosin (FLOMAX) 0.4 mg capsule TAKE 1 CAPSULE BY MOUTH ONCE DAILY (DO  NOT  CRUSH,  CHEW  OR  OPEN  CAPSULES  (TAKE  30  MINUTES  FOLLOWING  THE  SAME  MEAL  EACH  DAY).   ? thiamine mononitrate (vit B1) 100 mg tablet Take one tablet by mouth daily.   ? traMADoL (ULTRAM) 50 mg tablet Take one tablet by mouth every 6 hours as needed for Pain.   ? venlafaxine XR (EFFEXOR XR) 75 mg capsule Take one capsule by mouth daily. Take  with food.     There were no vitals filed for this visit.    There is no height or weight on file to calculate BMI.     Wt Readings from Last 20 Encounters:   08/11/21 119.6 kg (263 lb 10.7 oz)   08/03/21 119.3 kg (263 lb 0.1 oz)   07/20/21 116.6 kg (257 lb)   07/19/21 117 kg (257 lb 15 oz)   07/11/21 117 kg (258 lb)   06/29/21 118.7 kg (261 lb 9.6 oz)   06/03/21 115.2 kg (253 lb 15.5 oz)   05/04/21 118.8 kg (262 lb)   05/04/21 118.8 kg (262 lb)   04/26/21 117.5 kg (259 lb)   04/19/21 117.5 kg (259 lb)   03/18/21 123.4 kg (272 lb)   03/08/21 123.4 kg (272 lb)   02/24/21 126.1 kg (278 lb)   02/08/21 127 kg (280 lb)   01/27/21 127 kg (280 lb)   01/12/21 128.8 kg (284 lb)   12/21/20 124 kg (273 lb 6.4 oz)   12/06/20 124.7 kg (275 lb)   11/24/20 127.2 kg (280 lb 6.8 oz)         Physical Exam  Constitutional:       General: He is in acute distress.      Appearance: He is ill-appearing and diaphoretic.   HENT:      Head: Normocephalic and atraumatic.   Pulmonary:      Effort: Pulmonary effort is normal.      Comments: Wearing oxygen  Neurological:      Mental Status: He is alert.      Comments: 2 episodes of complete loss of consciousness in the middle of the telehealth visit followed by some increased confusion from his baseline   Psychiatric:         Mood and Affect: Mood normal.         Labwork reviewed:  Lab Results   Component Value Date/Time    HGBA1C 6.1 (H) 05/11/2021 12:46 PM    HGBA1C 6.7 (H) 04/04/2020 05:40 AM    HGBA1C 5.9 11/25/2019 02:45 PM    A1C 5.8 05/11/2021 12:00 AM    HGBPOC 9.5 (L) 07/24/2021 07:04 AM    HGBPOC 14.3 04/08/2020 06:58 AM    HGBPOC 11.2 (L) 10/25/2012 10:58 AM    MCALBR 10.8 05/11/2021 11:40 AM    TSH 2.79 07/20/2021 09:53 PM    FREET4R 0.9 05/30/2021 04:30 PM    CHOL 161 04/04/2020 05:40 AM    TRIG 152 (H) 04/04/2020 05:40 AM    HDL 53 04/04/2020 05:40 AM    LDL 87 04/04/2020 05:40 AM    NA 135 (L) 08/12/2021 04:03 AM    K 3.6 08/12/2021 04:03 AM    CL 94 (L) 08/12/2021 04:03 AM    CO2 33 (H) 08/12/2021 04:03 AM    GAP 8 08/12/2021 04:03 AM    BUN 45 (H) 08/12/2021 04:03 AM    CR 1.50 (H) 08/12/2021 04:03 AM    GLU 103 (H) 08/12/2021 04:03 AM    CA 8.6 08/12/2021 04:03 AM    PO4 2.9 08/11/2021 04:14 AM    ALBUMIN 2.9 (L) 08/12/2021 04:03 AM    TOTPROT 5.9 (L) 08/12/2021 04:03 AM    ALKPHOS 61 08/12/2021 04:03 AM    AST 12 08/12/2021 04:03 AM    ALT 7 08/12/2021 04:03 AM    TOTBILI 0.5 08/12/2021 04:03 AM    GFR 53 05/13/2021 12:00 AM    GFRAA >60  12/03/2019 04:51 AM    PSA 2.96 11/25/2018 12:00 AM              Assessment and Plan:    1. Ventricular tachycardia (HCC)    2. Chronic combined systolic (congestive) and diastolic (congestive) heart failure (HCC)    3. ICD (implantable cardioverter-defibrillator), biventricular, in situ    4. Coronary artery disease involving native coronary artery of native heart without angina pectoris      Ruben Wood is being evaluated today for hospital follow-up, but he is having a critical situation occurring.  He had 2 episodes of loss of consciousness in the middle of our telehealth visit that I suspect is related to his ventricular tachycardia.  He had what appeared to be 1 possible ICD shock in the middle of that, but that is difficult for me to say for sure given that this was on a virtual format in a fairly chaotic situation.  We did call 911 and they arrived on scene while I was still on the telehealth visit.  They are taking him into [facility] currently.  If he can be stabilized, then we can transfer him down to [place].    He had described having some episodes of heartburn, but this is highly suspicious of an acute coronary event especially given his significant coronary artery disease.  We will obviously need to investigate this further once he is in the hospital.    Total of 40 minutes were spent on the same day of the visit including preparing to see the patient, obtaining and/or reviewing separately obtained history, performing a medically appropriate examination and/or evaluation, counseling and educating the patient/family/caregiver, ordering medications, tests, or procedures, referring and communication with other health care professionals, documenting clinical information in the electronic or other health record, independently interpreting results and communicating results to the patient/family/caregiver, and care coordination.         There are no Patient Instructions on file for this visit.    No follow-ups on file.

## 2021-08-18 NOTE — Progress Notes
81 yo male pt presented to osh after having 2 syncopal episodes at home.  Pt was on a telehealth visit with [facility] pulmonology when pt lost consciousness and provider noted that he thought the pt's ICD fired.  Pt was confused after episodes.  Pts wife called 70 while pt was on the telehealth visit and MD remained connected during episodes.  Pt has a history of extensive cardiac issues and has a PM/ICD implanted.  Most recently the pt was hospitalized at [facility] from 6/20-6/23 for problems with his pacemaker.  At that time the pt's amiodarone was increased and several medication changes were done.  Upon discharge the pt was sent to a swing bed at the sending facility and he was dc'd from there on 6/27.  Upon arrival at ED, pt alert and oriented x 3.  Paced rhythm with PVC's noted on EKG.  Sending facility does not have capabilities to interrogate the pacemaker or ICD to determine what rhythm the ICD shocked.  Noted that pt is hypokalemic and potassium is being replaced at time of transfer call.  Pt c/o weakness x 2 days.  CT head and chest done and negative for any findings.  Riddle MD called ED prior to pt arriving and thought the pt should be transferred back to [facility] when stabilized.  Pt alert and oriented x 3.  VSS.  VS 104/60, HR 73, RR 20, 98% on 2L  Labs   Wbc 8.2  Hgb 10.2  Hct 31.2  Plt 462  Inr 1.1  Lactate 1.2  Pro BNP 3168  Na 133  K 3.0  Cl 91  Co2 33.5  Bun 46  Cr 1.8  Glu 110  Alb 2.9  Ca 9.7  Mg 1.9  [place] 0.6  Ast 17  Alt 8  Alk phos 90

## 2021-08-18 NOTE — H&P (View-Only)
Heart Failure H&P Note    NAME:Ruben Wood.                                                                   MRN: [id number]                 DOB:Apr 01, 1940          AGE: 81 y.o.  ADMISSION DATE: (Not on file)             DAYS ADMITTED: LOS: 0 days      Chief Complaint:  Evaluation and recommendations re: heart failure.        History of Present Illness:   Ruben Wood is an 81 year-old man admitted for recurrent VT.  He was in telehealth clinic today and had 2 reported VT shocks.  He was taken to [facility] where an initial workup showed hypokalemia.  Head CT imaging was negative.    He has a relevant past medical history of:  ? 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 CAD (s/p PCI April 2023, on Plavix)    ? Atrial Fibrillatoin  ? 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 CKD Stage III (Baseline Cr 1.2-1.5)    He states that he was talking to Dr. Crecencio Mc today (06/29) and experienced moments where he was in and out of it. He referred to these moments as seizures.  He denies an electrical/forceful sensation in his chest when these events happened.  He does endorse a burning senation that is hard to define on his chest.  It does not worsen with exertion, palpation of chest.  Of note, this burning sensation has been going on for at least a few weeks now.      When he inspires it does not hurt  He denies radiation to either arm and he denies diaphoresis.  He states the past few days he has felt nauseous and wanted to vomit but could not.      He is felt terrible afterwards and tried to dry heave.  His wife called 911 and he was taken to [facility] for stabilization.  His labs there were unremarkable besides a potassium of 3.0 and slightly elevated Cr (1.8).  His NT-Pro-BNP was around 3000.  His troponin was low.  He did not have leukocytosis or a lactic acidosis.  Imaging did not reveal pulmonary edema.      The EKG at the outside hospital was atrially-sensed ventricular paced rhythm.  He was given 60 mEq potassium and 12.5 mg Phenergan and sent over here.      He denies biting his tongue, urinating/defecating on himself, and/or having auras prior to his episodes.  He states that when he came back home from [place], he did not feel generally well.  He states that he has been taking the amiodarone and mexeltine that he was prescribed on discharge; however, he did not take his Bumex as prescribed.  He states that he did not take it for 3-4 days in the beginning of this week.      He is not sure if he has taken his Plavix recently.  It is unclear if his wife was taking care of this.  Unfortunately,  it is difficult to know how compliant he is with his medications based on my conversation with him.      He denies fevers/chills, vomiting, chest pain, palpitation, increased shortness of breath, abdominal pain, and/or lower extremity edema.  He does not endorse significant weight gain.      He does endorse nausea, fatigue, low appetite, low urine output.        Active Problems:    * No active hospital problems. *      Plan Today 08/18/2021:  1. Pro-NT BNP, CMP, Mg, Phos, TSH  2. Repeat EKG, TTE, CXR  3. Interrogate ICD and consult EP.  NPO after midnight for possible LHC  4. Resume Aspirin with his plavix.    5. Bumex 3 mg BID, do IV for now    Ongoing:  1. BMP twice a day. Magnesium level daily.  Keep Potassium greater than 4.0 and Magnesium greater than 2.0.  2. 2000mg  sodium dietary restriction.  3. Fluid Restriction:2L/24 hours  4. Strict I/O. Goal output: net neg 0L/24 hours  5. Follow up appointment with a member of the HF team or PCP within 7 calendar days of discharge.      Assessment/Plan:   Ventricular Tachycardia (Shock x2)  Prolonged QTC  Nausea/Vomiting  Burning Chest Sensation  ? Recently hospitalized from 06/20-06/23 from subacute rehab facility in [place], [place] for near syncope. Found to have VT based on interrogation  ? During this admission was discharged on Amiodarone 400 mg BID and Mexelitine 150 mg TID with plan to follow up with EP outpatient  ? Was doing telehealth appointment with Dr. Crecencio Mc and had 2x shocks with heart burn over past day or so; during appointment, pointed towards chest  ? During workup in [facility], had hypokalemia (K 3.0), Mg was fine at 1.9.  Lactic Acid was fine   ? Pro NT-BNP was 3100.  CT head and chest were negative at the time of the workup   ? EKG in outside hospital with QTC of 600  Plan:  >Interrogate ICD, Check CardioMems  >Consult EP for recurrent VT episodes  >Repeat TTE, Pro-NT BNP, and Troponins  >Repeat EKG    >Continue Mexelitine 150 mg TID, Start Amio drip per protocol.  If develops another shock, will need another amio bolus.     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  On amiodarone)  AAA s/p repair in 2014  HTN  ? Patient follows with Dr. Sherryll Burger, LOV 05/04/2021  ? 07/21/2021 2D ECHO done as below  ? Patient also has a history of PAF for which he is currently on amiodarone. Was previously on xarelto and then Watchman device was planned.   ? However, the patient was deemed to be very high risk for cardiac anesthesia and the procedure was canceled. Xarelto was discontinued around 06/2021 due to bleeding    GDMT PTA Changes   BB Yes -   Metoprolol succinate 12.5  Continued   ACEI/ARB/ARNI  No  Hypotension ?   SGLT-2 Inhibitor ?Previously on SGLT-2 Stopped due to UTI   Aldosterone Antagonist  No Hypotension ?   Hydralazine/Nitrate  No (N/A - patient not Black/African American) ?   Ivabradine No; Not treated with maximally tolerated dose beta blockers or beta blockers contraindicated ?  HRMT  Yes (CRT-D)  ?   Anticoagulation for Afib/flutter  No: Risk or history of bleeding  ?   ?Cardiac Rehab Evaluation? for LVEF <40% ? ?       Diuretic Therapy Bumex 3 mg BID   Prior to admission dose    Given on admission    Daily Dosing Bumex 3 Mg BID       Testing/Procedures        Plan:  > Cardiac Diet, 2 L Fluid restriction  > Cont PTA Metoprolol 12.5 mg     > Bumex 3 mg BID, BMP BID    CAD s/p PCI (April 2023)  ? 09/12/2012: LHC at [facility] showed CTO of the right coronary artery, CTO of the obtuse marginal branch and high-grade stenosis of ??90% in the mid left circumflex artery No significant stenosis in the left anterior descending artery or left main vessel   ? 04/02/2017: CTO of left circumflex, CTO of RCA s/p PCI with DES x 2, small distal coronary artery perforation.  ? 06/29/2017: DES placed to CTO of the circumflex artery, RCA stent patent  ? 05/27/2021: PCI of the left main, balloon angioplasty of the proximal left circumflex  ? Initially given triple therapy (Xarelto, Aspirin, Plavix); however, was discharged on Plavix and Xarelto   ? Per review of notes, He has had anemia and bleeding issues therefore Xarelto is being held for the time being. He is not a candidate for Watchman  Plan:  >Continue Plavix, resume aspirin   >Consider LHC to rule out ischemic event causing VT.  Made NPO after midnight    OSA on CPAP  COPD (4L O2 baseline), Chronic Bronchiectasis  Mobid obesity  > Continue CPAP at night  > Continue arformeterol, albuterol and ipratropium  ?  Type II DM  Neuropathic Pain  > POC glucose checks and LDCF  > Gabapentin 300 mg BID  > Gabapentin Cream BID  ?  CKD Stage III (baseline Cr 1.2-1.5)  -Was 1.8 on admission at outside hospital  Plan:  >Continue with diuresis   ?  BPH  > Continue Tamsulosin  >Hold finasteride   ?  Checklist:  Lines/Implants: ICD, Peripheral Lines  Prophylaxis: Enoxaparin 40 mg (CrCl>30), Clopidogrel, Aspirin  Code: Full Code  Disposition: Admit to Telemetry for VT shocks    Review of Systems:  Review of Systems   Constitutional: Positive for malaise/fatigue. Negative for chills, diaphoresis, fever and weight loss.   Eyes: Positive for blurred vision.   Respiratory: Negative for cough, hemoptysis, sputum production, shortness of breath and wheezing.    Cardiovascular: Negative for chest pain, palpitations, orthopnea, claudication, leg swelling and PND.   Gastrointestinal: Positive for constipation, heartburn and nausea. Negative for abdominal pain, blood in stool, diarrhea, melena and vomiting.   Genitourinary: Negative for dysuria, frequency, hematuria and urgency.   Musculoskeletal: Positive for joint pain. Negative for falls.   Neurological: Positive for dizziness, tingling, loss of consciousness and weakness. Negative for focal weakness, seizures and headaches.   Endo/Heme/Allergies: Bruises/bleeds easily.   Psychiatric/Behavioral: Negative for substance abuse. The patient is nervous/anxious.        Medical History:   Diagnosis Date   ? AAA (abdominal aortic aneurysm) (HCC) 10/15/2012   ? Asthma    ? Atrial fibrillation (HCC)    ? BPH with obstruction/lower urinary tract symptoms    ? Bronchiectasis (HCC)    ? CAD (coronary artery disease), native coronary artery 08/15/2012    07/11/2002- Cath @ [facility]  Hospital showed Severe double vessel disease(OMB of circumflex and distal circ)  inferior-basilar dyskinesis.                 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  09/12/12   Cath    ? Cardiomyopathy (HCC) 07/19/2014   ? Cellulitis of left lower extremity 11/12/2014    10/2014: admitted with cellulitis, [place] negative for venous clot, MRI without osteomyelitis.    11/12/2014 In clinic today, still with cellulitis.  Per patient and his wife, the erythema may be extending.  Cultures from drainage in hospital with MSSA, but Blood Cx negative.  No e/o osteomyelitis.  My concern is that this antibiotic regimen is not adequately treating his cellulitis. We will broaden coverage to get MRSA with doxycycline.  Patient and wife given strict call/return criteria.  I will see patient in 3-4 days for re-evaluation.  No fever today.  Plan:  Stop cefpodoxime and start doxycylcine  11/17/2014 Much better, no warmth and erythema minimal.   Plan: continue doxycyline for full 10 day course.      ? Chest wall deformity 07/09/2012    Chest wall reconstruction on 07/09/12    ? CHF (congestive heart failure) (HCC)    ? 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 Chronic cough 08/29/2012   ? 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 COPD (chronic obstructive pulmonary disease) (HCC) 08/13/2012   ? Diabetes (HCC)    ? Dyspnea    ? Edema    ? GERD (gastroesophageal reflux disease) 08/13/2012   ? History of CHF (congestive heart failure) 08/13/2012   ? History of MRSA infection 10/14/2012   ? 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    ? Hypertension 08/13/2012   ? IDA (iron deficiency anemia)    ? 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 Morbid obesity (HCC) 08/13/2012   ? On supplemental oxygen therapy    ? OSA on CPAP 08/13/2012   ? Pneumonia 04/2012    [facility]   ? Pneumonia due to COVID-19 virus 12/05/2018   ? Seasonal allergic reaction      Surgical History:   Procedure Laterality Date   ? LUNG SURGERY  2015    Bio bridge in left lung/rib cage was broken  from coughing   ? BACK SURGERY  Jan 2016   ? Right Heart Catheterization Right 11/06/2014    Performed by Cath, Physician at [facility] CATH LAB   ? CARDIAC DEFIBRILLATOR PLACEMENT  2017   ? Right Heart Catheterization With Insertion Pulmonary Artery Sensor Right 03/30/2015    Performed by Harley Alto, MD at [facility] CATH LAB   ? Insert CRT-D and Leads Right 11/25/2015    Performed by Deniece Ree, MD at [facility] EP LAB   ? Fluoroscopy N/A 11/25/2015  Performed by Deniece Ree, MD at [facility] EP LAB   ? Defibrillation Threshold Testing at ICD Implant N/A 11/25/2015    Performed by Deniece Ree, MD at [facility] EP LAB   ? VARICOSE VEIN SURGERY Left 01/05/2016    left small saphenous endovenous ablation with left leg microphlebectomy-Dr. Junita Push   ? HX MICROPHLEBECTOMY Right 07/06/2016    [facility]   ? HX ENDOVENOUS ABLATION OF THE SMALL SAPHENOUS VEIN Right 07/06/2016    [facility]   ? ANGIOGRAPHY CORONARY ARTERY WITH LEFT HEART CATHETERIZATION N/A 04/02/2017    Performed by Nat Math, MD at [facility] CATH LAB   ? PERCUTANEOUS CORONARY STENT PLACEMENT WITH ANGIOPLASTY N/A 04/02/2017    Performed by Nat Math, MD at [facility] CATH LAB   ? PERCUTANEOUS CORONARY STENT PLACEMENT WITH ANGIOPLASTY N/A 06/29/2017    Performed by Nat Math, MD at [facility] CATH LAB   ? LEFT ULNAR NERVE DECOMPRESSION AT ELBOW Left 04/30/2018    Performed by Letitia Neri, MD at [facility] OR   ? REMOVAL AND REPLACEMENT IMPLANTABLE DEFIBRILLATOR GENERATOR - MULTIPLE LEAD SYSTEM Right 11/24/2020    Performed by Annamarie Dawley, MD at [facility] EP LAB   ? PERCUTANEOUS TRANSCATHETER PLACEMENT INTRACORONARY STENT WITH/ WITHOUT ANGIOPLASTY - SINGLE MAJOR CORONARY ARTERY/ BRANCH   LT MAIN N/A 05/27/2021    Performed by Julienne Kass, MD at [facility] CATH LAB   ? BRONCHOSCOPY     ? CARDIAC CATHERIZATION  several years ago    [place]   ? DOPPLER ECHOCARDIOGRAPHY     ? HERNIA REPAIR     ? HX CHOLECYSTECTOMY     ? HX HEART CATHETERIZATION     ? HX LUMBAR LAMINECTOMY     ? HX PACEMAKER PLACEMENT       Family History   Problem Relation Age of Onset   ? Cancer Mother         liver   ? Stroke Sister    ? Cancer Father    ? Coronary Artery Disease Father    ? None Reported Sister      Social History     Socioeconomic History   ? Marital status: Married     Spouse name: [name]   ? Number of children: 0   Occupational History   ? Occupation: former Therapist, music: RETIRED     Comment: lives in a 82 year old house with wife   Tobacco Use   ? Smoking status: Former     Packs/day: 2.00     Years: 40.00     Pack years: 80.00     Types: Cigarettes     Quit date: 07/09/1998     Years since quitting: 23.1   ? Smokeless tobacco: Never   Vaping Use   ? Vaping Use: Never used   Substance and Sexual Activity   ? Alcohol use: Yes     Comment: occasion   ? Drug use: Never   ? Sexual activity: Not Currently        Objective:  Allergies:   Allergies   Allergen Reactions   ? Chlorhexidine BLISTERS and EDEMA   ? Spironolactone HYPOTENSION   ? Jardiance [Empagliflozin] UNKNOWN     UTI   ? Levofloxacin SEE COMMENTS     Prolonged QT        Medications:  Scheduled Meds:allopurinoL (ZYLOPRIM) tablet 300 mg, 300 mg, Oral, QDAY  amiodarone (CORDARONE) tablet 400 mg, 400 mg,  Oral, BID  arformoteroL (BROVANA) nebulizer solution 15 mcg, 15 mcg, Inhalation, BID  atorvastatin (LIPITOR) tablet 40 mg, 40 mg, Oral, QDAY  budesonide (PULMICORT) nebulizer solution 0.25 mg, 0.25 mg, Inhalation, BID  finasteride (PROSCAR) tablet 5 mg, 5 mg, Oral, QDAY  gabapentin (NEURONTIN) capsule 400 mg, 400 mg, Oral, BID  ipratropium bromide (ATROVENT) nasal spray 2 spray, 2 spray, Each Nostril, Q12H*  magnesium oxide (MAGOX) tablet 400 mg, 400 mg, Oral, QDAY  metoprolol succinate XL (TOPROL XL) tablet 12.5 mg, 12.5 mg, Oral, QDAY  mexiletine (MEXITIL) capsule 150 mg, 150 mg, Oral, TID  montelukast (SINGULAIR) tablet 10 mg, 10 mg, Oral, QHS  pantoprazole DR (PROTONIX) tablet 40 mg, 40 mg, Oral, BID  tamsulosin (FLOMAX) capsule 0.4 mg, 0.4 mg, Oral, QDAY after breakfast  thiamine mononitrate (vit B1) tablet 100 mg, 100 mg, Oral, QDAY  venlafaxine XR (EFFEXOR XR) capsule 75 mg, 75 mg, Oral, QDAY    Continuous Infusions:  PRN and Respiratory Meds:albuterol 0.083% Q6H PRN, nitroglycerin Q5 MIN PRN, traMADoL Q6H PRN    No medications prior to admission.                             Vital Signs:  Last Filed                Vital Signs: 24 Hour Range                  Wt Readings from Last 10 Encounters:   08/11/21 119.6 kg (263 lb 10.7 oz)   08/03/21 119.3 kg (263 lb 0.1 oz)   07/20/21 116.6 kg (257 lb)   07/19/21 117 kg (257 lb 15 oz)   07/11/21 117 kg (258 lb)   06/29/21 118.7 kg (261 lb 9.6 oz)   06/03/21 115.2 kg (253 lb 15.5 oz)   05/04/21 118.8 kg (262 lb)   05/04/21 118.8 kg (262 lb)   04/26/21 117.5 kg (259 lb)       Physical Exam:    General Appearance: no acute distress, obese  Skin: warm and dry  Lips & Oral Mucosa: no pallor or cyanosis   Digits and Nails: normal color; smooth symmetric nails and digits  Eyes: conjunctivae and lids normal  Neck Veins: Difficult to inspect due to body habitus  Chest Inspection: left chest incision well healed.  Telangiectasias on chest  Auscultation/Percussion: breathing comfortably; lungs clear to auscultation; no rales, rhonchi, or wheezing  Cardiac Auscultation: Regular rhythm, S1, S2 normal; systolic murmur grade II/III  Radial Arteries: normal symmetric radial pulses  Pedal Pulses: pulses 2+, symmetric  Lower Extremity Edema: no   Abdominal Exam: abdomen soft, non-tender; bowel sounds normoactive; no hepatomegaly  Gait & Station: normal balance and gait  Muscle Strength: normal strength and tone  Orientation: clear historian, good insight    Laboratory Review:   CBC w diff    Lab Results   Component Value Date/Time    WBC 5.7 08/12/2021 04:03 AM    RBC 2.53 (L) 08/12/2021 04:03 AM    HGB 8.3 (L) 08/12/2021 04:03 AM    HCT 25.6 (L) 08/12/2021 04:03 AM    MCV 101.3 (H) 08/12/2021 04:03 AM    MCH 33.0 08/12/2021 04:03 AM    MCHC 32.6 08/12/2021 04:03 AM    RDW 18.5 (H) 08/12/2021 04:03 AM    PLTCT 315 08/12/2021 04:03 AM    MPV 6.8 (L) 08/12/2021 04:03 AM    Lab Results  Component Value Date/Time    NEUT 71 08/04/2021 05:45 AM    ANC 6.42 08/04/2021 05:45 AM    LYMA 16 (L) 08/04/2021 05:45 AM    ALC 1.42 08/04/2021 05:45 AM    MONA 11 08/04/2021 05:45 AM    AMC 0.95 (H) 08/04/2021 05:45 AM    EOSA 1 08/04/2021 05:45 AM    AEC 0.05 08/04/2021 05:45 AM    BASA 1 08/04/2021 05:45 AM    ABC 0.04 08/04/2021 05:45 AM         Chemistry    Lab Results   Component Value Date/Time    NA 135 (L) 08/12/2021 04:03 AM    K 3.6 08/12/2021 04:03 AM    CL 94 (L) 08/12/2021 04:03 AM    CO2 33 (H) 08/12/2021 04:03 AM    GAP 8 08/12/2021 04:03 AM    BUN 45 (H) 08/12/2021 04:03 AM    CR 1.50 (H) 08/12/2021 04:03 AM    GLU 103 (H) 08/12/2021 04:03 AM    Lab Results   Component Value Date/Time    CA 8.6 08/12/2021 04:03 AM    PO4 2.9 08/11/2021 04:14 AM    ALBUMIN 2.9 (L) 08/12/2021 04:03 AM    TOTPROT 5.9 (L) 08/12/2021 04:03 AM    ALKPHOS 61 08/12/2021 04:03 AM    AST 12 08/12/2021 04:03 AM    ALT 7 08/12/2021 04:03 AM    TOTBILI 0.5 08/12/2021 04:03 AM    GFR 53 05/13/2021 12:00 AM    GFRAA >60 12/03/2019 04:51 AM            Renal Function    Lab Results   Component Value Date/Time    NA 135 (L) 08/12/2021 04:03 AM    K 3.6 08/12/2021 04:03 AM    CL 94 (L) 08/12/2021 04:03 AM    CO2 33 (H) 08/12/2021 04:03 AM    GAP 8 08/12/2021 04:03 AM    BUN 45 (H) 08/12/2021 04:03 AM    BUN 43 (H) 08/11/2021 04:03 PM    BUN 45 (H) 08/11/2021 04:14 AM    Lab Results   Component Value Date/Time    CR 1.50 (H) 08/12/2021 04:03 AM    CR 1.42 (H) 08/11/2021 04:03 PM    CR 1.41 (H) 08/11/2021 04:14 AM    GLU 103 (H) 08/12/2021 04:03 AM    CA 8.6 08/12/2021 04:03 AM    PO4 2.9 08/11/2021 04:14 AM    ALBUMIN 2.9 (L) 08/12/2021 04:03 AM        Lipid Profile INR   Lab Results   Component Value Date    CHOL 161 04/04/2020    TRIG 152 (H) 04/04/2020    HDL 53 04/04/2020    LDL 87 04/04/2020    VLDL 30 04/04/2020    NONHDLCHOL 108 04/04/2020    CHOLHDLC 2.4 08/12/2018         Lab Results   Component Value Date    INR 1.0 07/20/2021          Chest X-Ray: Ordered     Tele/ECG: Ordered    Echocardiogram Details:   Echo Results  (Last 3 results in the past 3 years)    Echo EF LVIDD LA Size IVS LVPW Rest PAP    (05/04/21)   25    (07/21/21)   6.60    (07/21/21)   5.10    (07/21/21)   1.30    (07/21/21)   1.30    (07/21/21)  39       (11/26/19)   35    (05/21/21)   6.54    (05/21/21)   5.31    (05/21/21)   1.26    (05/21/21)   1.08    (11/26/19)   37       (05/16/19)   30    (05/04/21)   6.60    (05/04/21)   4.60    (05/04/21)   1.10    (05/04/21)   1.00    (12/21/18)   37              Thereasa Solo, MD  PGY-1, Internal Medicine    08/18/2021

## 2021-08-18 NOTE — Progress Notes
RT Adult Assessment Note    NAME:Ruben Wood.             MRN: [id number]             DOB:02-Jun-1940          AGE: 82 y.o.  ADMISSION DATE: 08/18/2021             DAYS ADMITTED: LOS: 0 days    RT Treatment Plan:  Protocol Plan: Medications  Albuterol: Nebulizer PRN  Brovana: BID  Pulmicort (Home regimen only): BID    Protocol Plan: Procedures  PAP: Place a nursing order for "IS Q1h While Awake" for any of Lung Expansion indicators  Oxygen/Humidity: O2 to keep SpO2 > 92%, if on room air for > 24 hours and no other RT modalities are required, then D/C protocol  CPAP/BiPAP: CPAP  SpO2: Continuous (Document SpO2 result Qshift)    Additional Comments:  Impressions of the patient: Patient comfortable, NAD noted at this time. No albuterol neb indicated at this time.   Intervention(s)/outcome(s): RT eval  Patient education that was completed: None  Recommendations to the care team: See plan above.     Vital Signs:  Pulse: 77  RR: 18 PER MINUTE  SpO2: 95 %  O2 Device: Nasal cannula  Liter Flow: 4 Lpm  Breath Sounds: Clear (Implies normal);Decreased  Respiratory Effort:  Unlabored

## 2021-08-19 ENCOUNTER — Inpatient Hospital Stay: Admit: 2021-08-19 | Discharge: 2021-08-18 | Payer: MEDICARE

## 2021-08-19 ENCOUNTER — Inpatient Hospital Stay: Admit: 2021-08-19 | Discharge: 2021-08-19 | Payer: MEDICARE

## 2021-08-19 ENCOUNTER — Encounter: Admit: 2021-08-19 | Discharge: 2021-08-19 | Payer: MEDICARE

## 2021-08-19 MED ADMIN — PERFLUTREN LIPID MICROSPHERES 1.1 MG/ML IV SUSP [79178]: 2 mL | INTRAVENOUS | @ 15:00:00 | Stop: 2021-08-19 | NDC 11994001116

## 2021-08-19 MED ADMIN — ASPIRIN 81 MG PO CHEW [680]: 243 mg | ORAL | @ 17:00:00 | Stop: 2021-08-19 | NDC 00904679480

## 2021-08-19 MED ADMIN — SODIUM CHLORIDE 0.9 % IV SOLP [27838]: 250 mL | INTRAVENOUS | @ 02:00:00 | Stop: 2021-08-19 | NDC 00338004902

## 2021-08-19 MED ADMIN — SODIUM CHLORIDE 0.9 % IV SOLP [27838]: 1000 mL | INTRAVENOUS | @ 17:00:00 | Stop: 2021-08-20 | NDC 00338004904

## 2021-08-19 MED ADMIN — DEXTROSE 5% IN WATER IV SOLP [2364]: 150 mg | INTRAVENOUS | @ 09:00:00 | Stop: 2021-08-19 | NDC 00338001738

## 2021-08-19 MED ADMIN — AMIODARONE 50 MG/ML IV SOLN [9065]: 150 mg | INTRAVENOUS | @ 09:00:00 | Stop: 2021-08-19 | NDC 63323061601

## 2021-08-19 MED ADMIN — AMIODARONE 200 MG PO TAB [9066]: 400 mg | ORAL | @ 19:00:00 | NDC 00904699361

## 2021-08-19 MED ADMIN — B COMP NO3-FOLIC-C-BIOTIN-ZINC 1-60-300-12.5 MG-MG-MCG-MG PO TAB [173534]: 1 | ORAL | @ 23:00:00 | NDC 59528031701

## 2021-08-19 MED ADMIN — POTASSIUM CHLORIDE 20 MEQ PO TBTQ [35943]: 60 meq | ORAL | @ 15:00:00 | Stop: 2021-08-19 | NDC 00832532511

## 2021-08-20 MED ADMIN — MAGNESIUM SULFATE IN D5W 1 GRAM/100 ML IV PGBK [166578]: 1 g | INTRAVENOUS | @ 12:00:00 | Stop: 2021-08-20 | NDC 63323010800

## 2021-08-20 MED ADMIN — MAGNESIUM SULFATE IN D5W 1 GRAM/100 ML IV PGBK [166578]: 1 g | INTRAVENOUS | @ 17:00:00 | Stop: 2021-08-20 | NDC 44567041024

## 2021-08-20 MED ADMIN — AMIODARONE 200 MG PO TAB [9066]: 400 mg | ORAL | @ 03:00:00 | NDC 00904699361

## 2021-08-20 MED ADMIN — AMIODARONE 200 MG PO TAB [9066]: 400 mg | ORAL | @ 15:00:00 | NDC 00904699361

## 2021-08-20 MED ADMIN — MEXILETINE 200 MG PO CAP [10596]: 200 mg | ORAL | @ 21:00:00 | NDC 00093874001

## 2021-08-20 MED ADMIN — POTASSIUM CHLORIDE 20 MEQ PO TBTQ [35943]: 60 meq | ORAL | @ 15:00:00 | Stop: 2021-08-20 | NDC 00832532511

## 2021-08-20 MED ADMIN — B COMP NO3-FOLIC-C-BIOTIN-ZINC 1-60-300-12.5 MG-MG-MCG-MG PO TAB [173534]: 1 | ORAL | @ 15:00:00 | NDC 59528031701

## 2021-08-20 MED ADMIN — BUMETANIDE 2 MG PO TAB [9311]: 3 mg | ORAL | @ 03:00:00 | NDC 50268013211

## 2021-08-21 ENCOUNTER — Encounter: Admit: 2021-08-21 | Discharge: 2021-08-21 | Payer: MEDICARE

## 2021-08-21 MED ADMIN — SPIRONOLACTONE 25 MG PO TAB [7437]: 12.5 mg | ORAL | @ 17:00:00 | Stop: 2021-08-21 | NDC 00904692761

## 2021-08-21 MED ADMIN — ONDANSETRON HCL (PF) 4 MG/2 ML IJ SOLN [136012]: 4 mg | INTRAVENOUS | @ 02:00:00 | NDC 00641607801

## 2021-08-21 MED ADMIN — B COMP NO3-FOLIC-C-BIOTIN-ZINC 1-60-300-12.5 MG-MG-MCG-MG PO TAB [173534]: 1 | ORAL | @ 14:00:00 | Stop: 2021-08-21 | NDC 59528031701

## 2021-08-21 MED ADMIN — MEXILETINE 200 MG PO CAP [10596]: 200 mg | ORAL | @ 03:00:00 | NDC 00093874001

## 2021-08-21 MED ADMIN — MEXILETINE 200 MG PO CAP [10596]: 200 mg | ORAL | @ 11:00:00 | Stop: 2021-08-21 | NDC 00093874001

## 2021-08-21 MED ADMIN — AMIODARONE 200 MG PO TAB [9066]: 400 mg | ORAL | @ 14:00:00 | Stop: 2021-08-21 | NDC 00904699361

## 2021-08-21 MED ADMIN — LACTATED RINGERS IV SOLP [4318]: 250 mL | INTRAVENOUS | @ 14:00:00 | Stop: 2021-08-21 | NDC 00338011704

## 2021-08-21 MED ADMIN — AMIODARONE 200 MG PO TAB [9066]: 400 mg | ORAL | @ 01:00:00 | NDC 00904699361

## 2021-08-22 ENCOUNTER — Encounter: Admit: 2021-08-22 | Discharge: 2021-08-22 | Payer: MEDICARE

## 2021-08-22 NOTE — Telephone Encounter
Hospital Discharge Follow Up      Reached Patient: Yes, patient was identified, for their safety, using dual identification of name and date of birth  Patient Date of Birth: 1941-02-15     Admission Information:     Hospital Name: [facility] (includes main campus, [place], [place], [place], [place])  Admission Date: 08/18/21    Discharge Date: 08/21/21    Admission Diagnosis: Ventricular tachycardia [facility])  Discharge Diagnosis: Ventricular tachycardia (HCC)  Has there been a discharge within the last 30 days? 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 Yes    Home Health ordered? No                 Agency name/telephone number: NA   Has Home Health agency contacted patient? NA   Caregiver assistance in the home? Yes   Are there concerns regarding the patient's ADL'S? No  Is patient a fall risk? No    Special diet? Yes, Cardiac Diet       Medication Reconciliation    Changes to pre-hospital medications? Yes   START taking:  potassium chloride (KLOR-CON M10)  spironolactone (ALDACTONE)  Start taking on: August 22, 2021  Were new prescriptions filled? Yes  Meds reviewed and reconciled? Yes    Current Outpatient Medications   Medication Instructions   ? albuterol 0.083% (PROVENTIL) 2.5 mg /3 mL (0.083 %) nebulizer solution Inhale 3 mL solution by nebulizer as directed every 6 hours as needed for Wheezing or Shortness of Breath. Indications: asthma   ? allopurinoL (ZYLOPRIM) 300 mg, Oral, DAILY, take with food   ? amiodarone (PACERONE) 400 mg tablet Take 400 mg BID until follow up with EP   ? amitr-gabapen-emu oil 05-24-08% topical cream (COMPOUND) Apply to affected areas twice daily as needed.   ? arformoteroL (BROVANA) 15 mcg/2 mL nebulizer solution Inhale 2 mL solution by nebulizer as directed twice daily. Dx: J44.9   ? ascorbic acid (vitamin C) (ASCORBIC ACID (VITAMIN C)) 500 mg, Oral, DAILY   ? ascorbic acid-ascorbate sodium 500 mg chew 500 mg, Oral, EVERY MORNING   ? aspirin EC 81 mg, Oral, DAILY, Take with food.   ? atorvastatin (LIPITOR) 40 mg tablet Take 1 tablet by mouth once daily   ? azelastine (ASTELIN) 137 mcg (0.1 %) nasal spray 2 sprays, Each Nostril, TWICE DAILY, Use in each nostril as directed   ? benzonatate (TESSALON PERLES) 100 mg, Oral, EVERY  6 HOURS PRN   ? budesonide (PULMICORT) 0.25 mg/2 mL nebulizer solution Inhale 2 mL solution by nebulizer as directed twice daily. Dx: J44.9   ? bumetanide (BUMEX) 1 mg tablet Take with 2 mg tablet twice daily to make a total of 3 mg.   ? bumetanide (BUMEX) 2 mg tablet Take one tablet twice daily. Take along with 1 mg tablet for a total of 3 mg twice daily.   ? CHOLEcalciferoL (vitamin D3) 1,000 Units, Oral, DAILY   ? clopiDOGreL (PLAVIX) 75 mg, Oral, DAILY   ? diclofenac sodium (VOLTAREN) 4 g, Topical, FOUR TIMES DAILY   ? DOCOSAHEXAENOIC ACID-EPA PO Oral, DAILY   ? docusate (COLACE) 100 mg, Oral, DAILY  PRN   ? ferrous gluconate 324 mg, Oral, EVERY 48 HOURS   ?  finasteride (PROSCAR) 5 mg, Oral, DAILY   ? fish oil- omega 3-DHA/EPA 300/1,000 mg capsule 1 capsule, Oral, DAILY   ? flash glucose scanning reader (FREESTYLE LIBRE) reader Use as directed.   ? flash glucose sensor (FREESTYLE LIBRE 14 DAY SENSOR) sensor Type 2 diabetes   ? gabapentin (NEURONTIN) 400 mg, Oral, TWICE DAILY   ? guaiFENesin (ROBITUSSIN) 200 mg, Oral, EVERY  4 HOURS PRN   ? HYDROcodone/acetaminophen (NORCO) 5/325 mg tablet 1 tablet, Oral, EVERY  6 HOURS PRN   ? insulin pen needles (disposable) (BD ULTRA-FINE MINI PEN NEEDLE) 31 gauge x 3/16 pen needle Use with insulin pens   ? ipratropium bromide (ATROVENT) 21 mcg (0.03 %) nasal spray 2 sprays, Each Nostril, EVERY 12 HOURS   ? magnesium oxide 400 mg, Oral, TWICE DAILY   ? mepolizumab (NUCALA) 100 mg/mL injection pen Inject 100 mg under the skin every 28 days.   ? metOLazone (ZAROXOLYN) 2.5 mg, Oral, DAILY  PRN, PER OUTPATIENT CARDIOLOGY PROVIDER AS NEEDED FOR FLUID RETENTION   ? metoprolol succinate XL (TOPROL XL) 12.5 mg, Oral, DAILY   ? mexiletine (MEXITIL) 150 mg capsule Take 150 mg three times daily until follow up with EP   ? Miscellaneous Medical Supply misc Rx: Please wrap legs with ACE bandage from toes as high up to the leg as possible bilaterally<BR>Dx: Lymphedema   ? Miscellaneous Medical Supply misc itted for LE gradual compression stockings at a medical supply store   ? montelukast (SINGULAIR) 10 mg, Oral, AT BEDTIME DAILY   ? multivit-iron-FA-calcium-mins (THERA-M) 9 mg iron-400 mcg tablet 1 tablet, Oral, DAILY   ? nitroglycerin (NITROSTAT) 0.4 mg, Sublingual, EVERY  5 MIN PRN, Max of 3 tablets, call 911.    ? pantoprazole DR (PROTONIX) 40 mg, Oral, TWICE DAILY   ? polyethylene glycol 3350 (MIRALAX) 17 g, Oral, DAILY  PRN   ? potassium chloride (KLOR-CON M10) 10 mEq tablet 20 mEq, Oral, DAILY, Take with a meal and a full glass of water.   ? sodium chloride (HYPER-SAL) 3.5 % inhalation solution 4 mL, Inhalation, TWICE DAILY, Dx: J47.9   ? spironolactone (ALDACTONE) 12.5 mg, Oral, DAILY, Take with food.   ? tamsulosin (FLOMAX) 0.4 mg capsule TAKE 1 CAPSULE BY MOUTH ONCE DAILY (DO  NOT  CRUSH,  CHEW  OR  OPEN  CAPSULES  (TAKE  30  MINUTES  FOLLOWING  THE  SAME  MEAL  EACH  DAY).   ? thiamine mononitrate (vit B1) 100 mg, Oral, DAILY   ? traMADoL (ULTRAM) 50 mg, Oral, EVERY  6 HOURS PRN   ? TRULICITY 0.75 mg, Subcutaneous, EVERY  7 DAYS   ? venlafaxine XR (EFFEXOR XR) 75 mg, Oral, DAILY, Take with food.         Understanding Condition   Having any current symptoms? No, Pt states that he is feeling well this moring and is glad to be home and able to rest in his own home.   Do you have a history of Heart Failure? Yes, What time do you weigh yourself daily? Every morning  What was your weight today? Pt states that jsut got up and has not weighed himself.  Have you experienced weight gain since you were discharged from the hospital? Pt has not weighed himself at time of call.   Have you had an increase in swelling since discharge?No    Have you had an increase in shortness of air since discharge? No  Have you had an increase in  fatigue since discharge? No  Have you had an increase in abdominal bloating/tightness since discharge? No  Do you have a zone sheet? Yes   Please call your doctor if you experience weight gain of 3 lbs in one day or 5 lbs in one week.  Patient understands when to seek additional medical care? Yes   Other items discussed: Discussed with pt to report weight gain and any s/s he ay have to cardiology.     Scheduling Follow-up Appointment   Upcoming appointments:   Future Appointments   Date Time Provider Department Center   08/24/2021 10:30 AM CVM SN PACEMAKER SNHRM CVM Procedur   08/24/2021 11:00 AM Vanetta Shawl I, MD CVMSNCL CVM Exam   08/29/2021 12:00 AM MAC REMOTE MONITORING MACREMOTEHRM CVM Procedur   08/29/2021 11:00 AM Kaser, Lamar Blinks, APRN-NP MPGENMED IM   08/30/2021  3:00 PM Raelyn Mora, PA-C [facility] Urology   09/29/2021 12:00 AM MAC REMOTE MONITORING MACREMOTEHRM CVM Procedur   10/13/2021  2:40 PM Joanie Coddington, MD [id number] SPINE   10/26/2021  9:00 AM PF LAB SCHEDULE C [id number] None   10/26/2021 10:20 AM Deveron Furlong, MD MPAPULM IM   10/31/2021 12:00 AM MAC REMOTE MONITORING MACREMOTEHRM CVM Procedur   12/01/2021 12:00 AM MAC REMOTE MONITORING MACREMOTEHRM CVM Procedur   01/02/2022 12:00 AM MAC REMOTE MONITORING MACREMOTEHRM CVM Procedur   01/24/2022  1:45 PM Donnelly Stager, MD MACKUCL CVM Exam   02/02/2022 12:00 AM MAC REMOTE MONITORING MACREMOTEHRM CVM Procedur   03/06/2022 12:00 AM MAC REMOTE MONITORING MACREMOTEHRM CVM Procedur   04/06/2022 12:00 AM MAC REMOTE MONITORING MACREMOTEHRM CVM Procedur   05/08/2022 12:00 AM MAC REMOTE MONITORING MACREMOTEHRM CVM Procedur   06/08/2022 12:00 AM MAC REMOTE MONITORING MACREMOTEHRM CVM Procedur     Does the patient require 7 day follow up appointment? Yes   Hospital Follow-Up scheduled with PCP? Yes, Date: 08/29/21   When was patient?s last PCP visit: 05/11/2021   PCP primary location: [facility]  Specialist appointment scheduled? Yes, with Cardiology 08/24/21  Is assistance with transportation needed? No   MyChart message sent? Active in MyChart. No message sent.   Artera text sent? No    Micah Noel, RN

## 2021-08-24 ENCOUNTER — Ambulatory Visit: Admit: 2021-08-24 | Discharge: 2021-08-24 | Payer: MEDICARE

## 2021-08-24 ENCOUNTER — Encounter: Admit: 2021-08-24 | Discharge: 2021-08-24 | Payer: MEDICARE

## 2021-08-24 DIAGNOSIS — R0600 Dyspnea, unspecified: Secondary | ICD-10-CM

## 2021-08-24 DIAGNOSIS — Z9981 Dependence on supplemental oxygen: Secondary | ICD-10-CM

## 2021-08-24 DIAGNOSIS — J479 Bronchiectasis, uncomplicated: Secondary | ICD-10-CM

## 2021-08-24 DIAGNOSIS — U071 Pneumonia due to COVID-19 virus: Secondary | ICD-10-CM

## 2021-08-24 DIAGNOSIS — Z8614 Personal history of Methicillin resistant Staphylococcus aureus infection: Secondary | ICD-10-CM

## 2021-08-24 DIAGNOSIS — N183 CKD (chronic kidney disease) stage 3, GFR 30-59 ml/min (HCC): Secondary | ICD-10-CM

## 2021-08-24 DIAGNOSIS — I429 Cardiomyopathy, unspecified: Secondary | ICD-10-CM

## 2021-08-24 DIAGNOSIS — I10 Essential (primary) hypertension: Secondary | ICD-10-CM

## 2021-08-24 DIAGNOSIS — R609 Edema, unspecified: Secondary | ICD-10-CM

## 2021-08-24 DIAGNOSIS — J45909 Unspecified asthma, uncomplicated: Secondary | ICD-10-CM

## 2021-08-24 DIAGNOSIS — G4733 Obstructive sleep apnea (adult) (pediatric): Secondary | ICD-10-CM

## 2021-08-24 DIAGNOSIS — I509 Heart failure, unspecified: Secondary | ICD-10-CM

## 2021-08-24 DIAGNOSIS — N401 Enlarged prostate with lower urinary tract symptoms: Secondary | ICD-10-CM

## 2021-08-24 DIAGNOSIS — R053 Chronic cough: Secondary | ICD-10-CM

## 2021-08-24 DIAGNOSIS — J302 Other seasonal allergic rhinitis: Secondary | ICD-10-CM

## 2021-08-24 DIAGNOSIS — E119 Type 2 diabetes mellitus without complications: Secondary | ICD-10-CM

## 2021-08-24 DIAGNOSIS — J449 Chronic obstructive pulmonary disease, unspecified: Secondary | ICD-10-CM

## 2021-08-24 DIAGNOSIS — I5041 Acute combined systolic (congestive) and diastolic (congestive) heart failure: Secondary | ICD-10-CM

## 2021-08-24 DIAGNOSIS — I5042 Chronic combined systolic (congestive) and diastolic (congestive) heart failure: Secondary | ICD-10-CM

## 2021-08-24 DIAGNOSIS — E782 Mixed hyperlipidemia: Secondary | ICD-10-CM

## 2021-08-24 DIAGNOSIS — Z95810 Presence of automatic (implantable) cardiac defibrillator: Secondary | ICD-10-CM

## 2021-08-24 DIAGNOSIS — D509 Iron deficiency anemia, unspecified: Secondary | ICD-10-CM

## 2021-08-24 DIAGNOSIS — J189 Pneumonia, unspecified organism: Secondary | ICD-10-CM

## 2021-08-24 DIAGNOSIS — I4901 Ventricular fibrillation: Secondary | ICD-10-CM

## 2021-08-24 DIAGNOSIS — L03116 Cellulitis of left lower limb: Secondary | ICD-10-CM

## 2021-08-24 DIAGNOSIS — I2510 Atherosclerotic heart disease of native coronary artery without angina pectoris: Secondary | ICD-10-CM

## 2021-08-24 DIAGNOSIS — Z95828 Presence of other vascular implants and grafts: Secondary | ICD-10-CM

## 2021-08-24 DIAGNOSIS — K219 Gastro-esophageal reflux disease without esophagitis: Secondary | ICD-10-CM

## 2021-08-24 DIAGNOSIS — Z8679 Personal history of other diseases of the circulatory system: Secondary | ICD-10-CM

## 2021-08-24 DIAGNOSIS — I714 AAA (abdominal aortic aneurysm) (HCC): Secondary | ICD-10-CM

## 2021-08-24 DIAGNOSIS — M954 Acquired deformity of chest and rib: Secondary | ICD-10-CM

## 2021-08-24 DIAGNOSIS — M961 Postlaminectomy syndrome, not elsewhere classified: Secondary | ICD-10-CM

## 2021-08-24 DIAGNOSIS — I4891 Unspecified atrial fibrillation: Secondary | ICD-10-CM

## 2021-08-24 NOTE — Progress Notes
Daily CardioMEMS Readings    Goal PA Diastolic: 15 mmHg   PA Diastolic Thresholds: 12-17 mmHg  Notified [facility]

## 2021-08-24 NOTE — Progress Notes
Date of Service: 08/24/2021    Ruben Wood is a 81 y.o. male.       HPI   Dr. Sherryll Burger and I had the pleasure of seeing Ruben Wood, who is accompanied by his wife Ruben Wood today in [place] [facility] clinic for  post hospital  follow-up.        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       He has had 3 recent hospitalizations.  First hospitalization 3/31 - 4/14 with V-fib status post ICD shock, subsequently admitted 5/31- 6/15 for acute on chronic heart failure with reduced ejection fraction, next was hospitalized from 6/20 through 6/23 for ICD malfunction, then most recently 6/296/29 - 7/2 for 2 syncopal events and ICD shock.    Today his wife thinks he is not doing well,  he is wearing 02 at 4 liters/nc.  He has started Nucala injections, he is not feeling hungry.  Home weight was 242 pounds, he is having swelling in legs, drainage on left leg .  Home BP has been low, she states his spiornolactone was discontinued due to low BP.  He denies bleeding issues, no recent blood in the stools..  They were concerned that he did not receive his amiodarone after discharge, it sounds like [facility] was out of the amiodarone and he had a delay in getting it started.  He has not consistently been taking his Neurontin and complains of excruciating peripheral neuropathy.  They have had difficulty getting his Cardiomems readings.  Mr. Mcniven is rightfully concerned about receiving further shocks from his ICD.    Hospital Discharge Follow Up    ?  Reached Patient: Yes, patient was identified, for their safety, using dual identification of name and date of birth  Patient Date of Birth: 1940/03/03   ?  Admission Information:     Hospital Name: [facility] (includes main campus, [place], [place], [place], [place])  Admission Date: 08/18/21    Discharge Date: 08/21/21    Admission Diagnosis: Ventricular tachycardia [facility])  Discharge Diagnosis: Ventricular tachycardia (HCC)  Has there been a discharge within the last 30 days? Yes  If yes, reason: ICD (implantable cardioverter-defibrillator) malfunction ICD (implantable cardioverter-defibrillator) malfunction, initial encounter, Ventricular tachycardia [facility])  Hospital Services: Unplanned  Today's call is 1 (business) days post discharge       Vitals:    08/24/21 1035   BP: 99/62   BP Source: Arm, Right Upper   Pulse: 60   Temp: 36.2 ?C (97.2 ?F)   SpO2: 99%   O2 Device: Nasal cannula   O2 Liter Flow: 4 Lpm   TempSrc: Skin   PainSc: Ten   Weight: 113.7 kg (250 lb 11.2 oz)  Comment: used wheelchair ramp   Height: 180.3 cm (5' 11)     Body mass index is 34.97 kg/m?[name]     Past Medical History  Patient Active Problem List    Diagnosis Date Noted   ? Ventricular tachycardia (HCC) 08/18/2021   ? ICD (implantable cardioverter-defibrillator) malfunction 08/09/2021   ? ICD (implantable cardioverter-defibrillator) malfunction, initial encounter 08/09/2021   ? Chronic respiratory failure with hypoxia (HCC) 07/20/2021   ? Ventricular fibrillation (HCC) 05/23/2021   ?  Moderate mitral regurgitation 05/23/2021   ? Sacroiliitis (HCC) 04/26/2021   ? ICD (implantable cardioverter-defibrillator) discharge 11/24/2020     11/24/20 Successful Dual-chamber cardiac resynchronization therapy (CRT) defibrillator generator replacement (Abbott) by Dr. Milas Kocher       ? Iron deficiency anemia 09/30/2020   ? Peripheral arterial disease (HCC) 06/22/2020   ? Epistaxis 05/14/2020     Was a problem after starting anticoagulation 04/2020, but this has been an ongoing issue for him. On O2 via NC at baseline.         ? Gout 04/03/2020 ? Dyspnea 04/02/2020   ? 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 Chronic bronchitis (HCC) 11/25/2019   ? Chronic bronchitis with acute exacerbation (HCC) 12/05/2018   ? Acute heart failure with reduced ejection fraction and diastolic dysfunction (HCC) 12/05/2018   ? 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 Type 2 diabetes mellitus with hyperglycemia, without long-term current use of insulin (HCC) 03/12/2018   ? Atrial fibrillation (HCC) 03/12/2018   ? Orthostatic hypotension 12/28/2017   ? Chronic respiratory failure with hypercapnia (HCC) 11/16/2017   ? Depression 11/15/2017   ? OAB (overactive bladder)      Urinary frequency, urgency, urge urinary incontinence (UUI), nocturia, nocturnal enuresis.  -- trial Mirabegron 25 mg --> improved, but persistent sx's.  -- trial Mirabegron 50 mg.     ? Urinary incontinence, urge      See OAB A&P note.     ? Bronchiectasis with acute lower respiratory infection (HCC) 05/10/2017   ? 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     ? Benign prostatic hyperplasia (BPH) 03/01/2017     TRUS Prostate (03/01/2017): Prostate volume = 38.3 mL.  D/c'd Tamsulosin d/t lack of symptom improvement.     ? Enrolled in chronic care management 02/28/2017   ? History of elevated prostate specific antigen (PSA)      PNBx (03/01/2017): (L) high-grade prostatic intraepithelial neoplasia (HG PIN), 1/6 cores; [name], PA-C.     ? Spondylolisthesis, lumbar region 12/13/2016   ? Lumbar post-laminectomy syndrome      L4-5     ? Spinal stenosis of lumbosacral region 09/20/2016   ? Spondylolisthesis of lumbosacral region 09/20/2016   ? Osteoarthritis of spine with radiculopathy, lumbosacral region 09/20/2016   ? 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 He had a normal response to pneumococca vaccination; he had 17/23 positive serotypes. He has normal tetanus toxoid Ab and CH50.  His T&B cell panel revealed a mildly low B-cell count at 68 right after a time of acute illness.  He had positive diphtheria antibody.    Likely multifactorial with significant comorbidities including frequent steroid use for bronchiectasis exacerbations, frequent infections, and possibly a nutritional component (low total protein).    Previously on doxycycline once daily for recurrent infections, which he initially felt had been tremendously helpful, but he has had a couple episodes of what may be pneumonia in early 2022 when he was off.     Plan:  - Restart doxycycline 100 mg daily.  - Recommend getting the Pneumovax immunization next time he is in clinic in person and will need repeat pneumococcal antibody levels 1 month afterwards once he has had resolution  of his COVID issues.          ? Moderate persistent asthma with acute exacerbation 02/25/2016     He has had asthma since sometime in his 20-30s. He gets cough, chest tightness, wheezing, shortness of breath that is year round with worsening in the Spring and Fall.   Triggers: URI, hot/cold air. Additionally he has been diagnosed with bronchiectasis and emphysema; his PFTs suggest both obstructive and restrictive disease and asthma is unlikely to be the sole cause of his dyspnea.    He has negative IgE immunocaps to aeroallergens which makes the possibility of allergic asthma highly unlikely. His IgE level was 17 and he had negative ANCAs, MPO and PR3.     Repeatedly being treated with steroids now for flares.  Also on hypertonic saline nebs, DuoNebs, budesnoide/Brovana via neb, and Singulair as well as aggressive pulmonary clearance with vest and Aerobika.        ? 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 He is not having issues with urticaria.    - We recommend IgE immunocaps to aeroallergens.      ? Recurrent infections 02/25/2016     Recurrent sinopulmonary infections requiring antibiotics 5-10 times per year.   Cellulitis and non-healing wounds.   Many underlying illnesses predisposing him to these infections in addition to intermittent hypogammaglobulinemia associated with active infections and systemic steroids.       ? S/P left pulmonary artery pressure sensor implant placement (CardioMEMs)  02/02/2016     * Patient has CardioMEMs (Pulmonary Artery Pressure Sensor)* Please call Matthew Folks, CardioMEMs Program Coordinator [phone] or page Heart Failure Rounding Team if patient is admitted or presents to ED*  03/30/15 implant     ? Ischemic cardiomyopathy 01/12/2016   ? ICD (implantable cardioverter-defibrillator), biventricular, in situ 12/07/2015     11/24/20 Successful Dual-chamber cardiac resynchronization therapy (CRT) defibrillator generator replacement ([facility]bbott) by Dr. Milas Kocher     ? Hypercholesterolemia 11/25/2015   ? 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 Bronchiectasis without complication (HCC) 06/18/2015     Non-sputum producer.  Currently on Symbicort.  - Dr. [name] was able to get him a vest to wear at home for secretions and this has helped immensely with his secretions and breathing.      ? Osteoarthritis of spine with radiculopathy, lumbar region 07/28/2014   ? Acute on chronic HFrEF (heart failure with reduced ejection fraction) (HCC) 07/19/2014     11/04/14: EF 20%, severely dilated LV with grade 1 diastolic dysfunction.  11/12/2014 In clinic today, patient appears fairly well compensated.  He does have some LEE, but no significant rales (just some at bases), no JVD sitting upright, and no sx to suggest decompensation.  We will continue metoprolol, spironolactone, and bumex at current doses.    11/17/2014 Edema much improved in lower ext, Cr trending down on last labs.  Will recheck labs today.  Advised to weigh himself daily.  Rechecking BMP today to ensure not overdoing diuretics.     On metoprolol, spironolactone.  Will need to explore allergy to ARB more as patient may benefit from ACEI.  03/30/15 CardioMEMS implant by Dr. Chales Abrahams  1. Normal cardiac output and cardiac index.  2. Normal pulmonary pressures and normal pulmonary capillary wedge pressure.  3. Successful insertion of CardioMEMS pulmonary artery pressure sensor    05/04/2021 - ECHO:  The left ventricular systolic function is severely reduced. The visually estimated ejection fraction is 25%.  Severe LV dilatation, severe global hypokinesis.  The right ventricular size, wall thickness and systolic function are normal. ICD lead is present.  Moderate mitral valve regurgitation, mild tricuspid valve regurgitation.  The pulmonary artery pressure could not be estimated due to inadequate tricuspid regurgitation signal       ? 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      ? Stage 3 chronic kidney disease (HCC) 08/26/2013   ? Mixed dyslipidemia 08/26/2013     Hepatic Function    Lab Results   Component Value Date/Time    ALBUMIN 4.0 11/04/2014 12:26 AM    TOTAL PROTEIN 6.9 11/04/2014 12:26 AM    ALK PHOSPHATASE 61 11/04/2014 12:26 AM    Lab Results   Component Value Date/Time    AST (SGOT) 41* 11/04/2014 12:26 AM    ALT (SGPT) 19 11/04/2014 12:26 AM    TOTAL BILIRUBIN 1.4* 11/04/2014 12:26 AM        Lab Results   Component Value Date    CHOL 148 12/15/2013    TRIG 122 12/15/2013    HDL 51 12/15/2013    LDL 88 12/15/2013    VLDL 24 12/15/2013    NONHDLCHOL 97 12/15/2013       BP Readings from Last 3 Encounters:   11/12/14 129/80   11/09/14 111/44   11/04/14 146/65     Plan:   81 y.o. with known CAD with CTO of RCA and obtuse marginal branch with high grade stenosis (90%) in mid left circ.    Advised heart healthy diet and daily exercise.    Continue high intensity statin, atorvastatin       ? AAA (abdominal aortic aneurysm) (HCC) 10/15/2012     Duplex done at OSH in 2007 and 2008 ~ 4.1 cm    Duplex 10/15/12-AAA 7.3 cm AP x 7.3 cm       ? History of MRSA infection 10/14/2012   ? CAD (coronary artery disease), native coronary artery 08/15/2012     07/11/2002- Cath @ [facility] showed Severe double vessel disease(OMB of circumflex and distal circ)  inferior-basilar dyskinesis.                  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    09/12/12   Cath - [facility]:  CTO of the right coronary artery, CTO of the obtuse marginal branch and high-grade stenosis of   90% in the mid left circumflex artery No significant stenosis in the left anterior descending artery or left main vessel     Failed attempt in opening 2nd obtuse marginal CTO as described  above.    10/14/12: Unsuccessful attempt to open CTO of OMB and mid-CFX by Dr. Mackey Birchwood      04/02/17: Cath by Dr. Steward Ros:   4. Chronic total occlusion of the circumflex.  5. Chronic total occlusion of the right coronary artery, status post percutaneous coronary intervention with drug-eluting stent times 2.  6. Small distal right coronary artery perforation without echo or hemodynamic evidence of compromise.    06/29/17-cardiac catheterization by Dr. Steward Ros. One DES placed to chronic total occlusion of the circumflex artery. The RCA stent was patent.    05/23/2021 - Cardiac Catheterization:  Severe distal left main calcified stenosis confirmed with abnormal resting full-cycle ratio and abnormal intravascular ultrasound.  70% ostial stenosis and a jailed 1st marginal artery, which is jailed by a circumflex artery stent.  Normal left ventricular filling pressures and central aortic pressure.    05/27/2021 - Cardiac Catheterization:  Successful percutaneous coronary intervention of the left main coronary artery into the proximal left anterior descending artery with a 4.0 X 18 mm Xience Skypoint DES post-dilated with 5.0 NC.  We used Impella CP for hemodynamic support.  Shockwave lithotripsy with a 4.0 mm C2 IVL catheter, and intravascular ultrasound were utilized for the procedure.  Balloon angioplasty of the proximal left circumflex artery with a 2.5 mm balloon.  The minimum stent area of the proximal left anterior descending artery was 12.8 mm, distal left main was 16.5 mm, and the ostial left main was 17.4 sq mm.     ? OSA on CPAP 08/13/2012     CPAP at  DME: [facility]    Split night:  08/14/2017  AHI 43.2  REM n/a  Time below 88 %: 98 mins  Lowest sat: 83%     CPAP 10 cm h20 effective      ? COPD (chronic obstructive pulmonary disease) (HCC) 08/13/2012     04/28/13: PFTs with more restrictive pattern with FEV1 50%, FEV1/FVC 89%, DLCO 65%.    12/27/13: PFTs with FEV1 82%, FEV1/FVC 113%, and DLCO 77%     ? 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  Discussed the importance of diet.  Exercise as tolerated.        ? Essential hypertension 08/13/2012   ? Chest wall deformity 07/09/2012     Chest wall reconstruction on 07/09/12  Lung herniation- bio bridge           Review of Systems   Constitutional: Positive for malaise/fatigue.   HENT: Negative.    Eyes: Negative.    Cardiovascular: Negative.    Respiratory: Negative.    Endocrine: Negative.    Hematologic/Lymphatic: Negative.    Skin: Negative.    Musculoskeletal: Negative.    Gastrointestinal: Negative.    Genitourinary: Negative.    Neurological: Negative.    Psychiatric/Behavioral: Negative.    Allergic/Immunologic: Negative.        Physical Exam  General Appearance: In NAD, obese  Neck Veins: normal JVP, neck veins are not distended; no HJR   Chest Inspection: chest is normal in appearance   Respiratory Effort: breathing comfortably, no respiratory distress   Auscultation/Percussion: lungs clear to auscultation, no rales, rhonchi or wheezing wearing oxygen at 4 L nasal cannula  Cardiac Rhythm: regular rhythm and normal rate   Cardiac Auscultation: S1, S2 normal, no rub, no definite S3  or S4   Murmurs: no murmur   Peripheral Circulation: normal peripheral circulation   Pedal Pulses: normal symmetric pedal pulses   Lower Extremity Edema: Trace lower extremity edema, venous discoloration, there is a dressing on the left mid shin  Abdominal Exam: soft, non-tender, no obvious masses, bowel sounds normal   Gait & Station: Seated in a wheelchair  Orientation: oriented to person, place and time   Affect & Mood: appropriate and sustained affect   Language and Memory: patient responsive and seems to comprehend information   Neurologic Exam: neurological assessment grossly intact   Vital signs were reviewed    Cardiovascular Studies 08/19/2021 technically difficult study due to suboptimal acoustic windows.  ?  1. Severely dilated left ventricle with mild eccentric hypertrophy. Moderately reduced ejection fraction. By Biplane method-of-discs calculation, EF=35%. There is diffuse hypokinesis. Grade II (moderate) diastolic dysfunction.  Elevated left atrial pressure.  2. Probably normal right ventricle size.  Normal systolic function.  3. Moderately dilated left atrium.  Mildly dilated right atrium.  4. There are pacemaker leads present in the right heart chambers.  5. Nonspecific thickening of mitral valve with mild annular calcification. No stenosis. Moderate regurgitation with an eccentric, posteriorly-directed jet.  Regurgitation severity may be underestimated given the eccentric nature of the jet.  6. Mild tricuspid valve regurgitation.  7. Estimated peak systolic pulmonary artery pressure is 34 mm Hg (~33% systemic pressure).  8. Normal aortic root and proximal ascending thoracic aorta diameters  9. No pericardial effusion.?  A serial comparison was performed with a transthoracic echocardiogram from July 21, 2021. Left ventricular size and ejection fraction are similar to prior study.  The mitral valve regurgitation appears similar between both examinations as well.    DETAILS OF THE RIGHT HEART CATHETERIZATION:    1. Blood pressure 112/57/mean 75 mmHg.  2. Heart rate 69 beats per minute.  3. Body surface area 2.3 square meter.  4. Hemoglobin percentage 9.9 g/dL.?  SATURATIONS:    1. Aorta saturation 91%.  2. RA saturation 54%.  3. PA saturation 52%.?  PRESSURES:    1. Right atrial pressure 8 mmHg.  2. Right ventricular pressure 34/7/EDP 9 mmHg.  3. PA pressure 35/15/mean 21 mmHg.  4. Pulmonary capillary wedge pressure 14 mmHg.  5. Transpulmonic gradient 7 mmHg.  6. Cardiac output by thermodilution technique was 5.2 L/minute and by Fick method was 5.3 L/minute.  7. Cardiac index by thermodilution technique was 2.2 L/minute per square meter and by Fick method was 2.3 L/minute per square meter.  8. Systemic vascular resistance was 1030 DSC -5 (12.8 Wood units).  9. Pulmonary vascular resistance was 107 DSC -5 (1.3 Wood units).?  DETAILS OF THE CORONARY ANGIOGRAM:  Right common femoral arterial access was obtained using a micropuncture needle and a modified Seldinger technique to insert a 5-French pinnacle sheath with good blood flow return.  JL4 was used for selective left coronary angiography, JR4 for selective right coronary angiography, and angled pigtail catheter for LV pressure assessment over an 0.035 exchange J-wire.?  DETAILS OF LV HEMODYNAMICS:    1. LV systolic blood pressure was 110 mmHg.  2. LVEDP was 14 mmHg.  3. No gradient across the aortic valve on catheter pullback.  ?  CORONARY ANGIOGRAM:  Left main:  The left main arises normally from the left coronary cusp, short vessel with previously placed stent, which demonstrates  no evidence of in-stent restenosis or stent thrombosis.  TIMI-3 flow was noted across the stent.  The left main bifurcates into left circumflex artery and the left anterior descending artery.  The left circumflex artery in its proximal segment has previously placed stents, which are widely patent.  It gives rise to a dominant circumflex artery, which has minor plaquing, but no significant obstructive disease is noted.  The patient has 2 different obtuse marginal vessels and the left PLV branch, all of which have minor plaquing, but no significant obstructive disease.  It is a moderate caliber vessel.   ?  Left anterior descending artery large type 3 configuration.  Proximally the stent extending from the left main has minor plaquing, but no significant obstructive coronary artery disease.  Mid and distal left anterior descending artery are demonstrating normal flow as well as no significant obstructive disease.  It gives rise to a large diagonal in its proximal segment with minor plaquing, but no significant obstructive stenosis noted.   ?  Right coronary artery:  The right coronary artery arises from the right coronary cusp.  Proximally has previously placed stents, which are widely patent.  Mid and distal RCA are demonstrating minor plaquing, but no significant obstructive disease, continues down as a short PDA, which has mild plaquing, but no significant obstructive disease is noted.  ?    Right common femoral angiogram demonstrated anatomy, which was unsuitable for Angio-Seal closure and hence manual compression will be held.  ?  FINAL IMPRESSION:    1. Patent epicardial coronary arteries without significant focal obstructive coronary artery disease.  Previously placed left main, circumflex, and RCA stents are widely patent without significant in-stent     restenosis.  2. Normal TIMI-3 flow noted in all arteries.  3. Normal LVEDP of 12 to 14 mmHg without aortic stenosis.  4. Filling pressures suggest well diuresed state     Cardiovascular Health Factors  Vitals BP Readings from Last 3 Encounters:   08/24/21 99/62   08/21/21 107/44   08/12/21 124/89     Wt Readings from Last 3 Encounters:   08/24/21 113.7 kg (250 lb 11.2 oz)   08/21/21 110.3 kg (243 lb 3.2 oz)   08/11/21 119.6 kg (263 lb 10.7 oz)     BMI Readings from Last 3 Encounters:   08/24/21 34.97 kg/m?   08/21/21 33.92 kg/m?   08/11/21 36.77 kg/m?      Smoking Social History     Tobacco Use   Smoking Status Former   ? Packs/day: 2.00   ? Years: 40.00   ? Pack years: 80.00   ? Types: Cigarettes   ? Quit date: 07/09/1998   ? Years since quitting: 23.1   Smokeless Tobacco Never   Vaping Use   ? Vaping Use: Never used      Lipid Profile Cholesterol   Date Value Ref Range Status   04/04/2020 161 <200 MG/DL Final     HDL   Date Value Ref Range Status   04/04/2020 53 >40 MG/DL Final     LDL   Date Value Ref Range Status   04/04/2020 87 <100 mg/dL Final     Triglycerides   Date Value Ref Range Status   04/04/2020 152 (H) <150 MG/DL Final      Blood Sugar Hemoglobin A1C   Date Value Ref Range Status   05/11/2021 6.1 (H) 4.0 - 5.7 % Final     Comment:     The ADA recommends that  most patients with type 1 and type 2 diabetes maintain   an A1c level <7%.       Glucose   Date Value Ref Range Status   08/21/2021 99 70 - 100 MG/DL Final 16/11/9602 540 (H) 70 - 100 MG/DL Final   98/12/9145 829 (H) 70 - 100 MG/DL Final     Glucose Fasting   Date Value Ref Range Status   12/15/2014 111  Final     Glucose, POC   Date Value Ref Range Status   08/21/2021 151 (H) 70 - 100 MG/DL Final   56/21/3086 578 (H) 70 - 100 MG/DL Final   46/96/2952 841 (H) 70 - 100 MG/DL Final          Problems Addressed Today  No diagnosis found.    Assessment and Plan     ?     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    The patient today describes NYHA Functional Class 3 stage C symptoms.    The home diuretic is bumex 3 mg twice daily and metolazone 2.5 mg PRN.   Patient appears euvolemic on physical exam today   Estimated dry weight is based off of Cardiomems readings, he appears euvolemic on physical exam, his clinic weight today is 250 pounds, his wife does not think that that is an accurate weight HF Therapy:   GDMT Current Dose Changes made at visit   BB  metoprolol 12.5 mg daily    ACEI/ARB/ARNI  none due to hypotension    Aldosterone antagonist  12.5 mg daily    SGLT2i  intolerance of Jardiance (not specified.    Hydralazine/Nitrate  none due to hypotension    Ivabradine  none resting pulse rate 60    Cardiac rehab Yes; Date ordered: 6/1       Advanced Therapies Candidacy/Contraindications   Age > 80 years and Obesity (BMI > 45 kig/m2)       ?CRT-D in situ preliminary read of device interrogation today shows AAS-AP BiV pacing 70 bpm, heart failure diagnostics show [place] within expected range, A-fib burden 0%, percent CRT pacing 99.85, PVC burden less than 1%.     ?  Paroxysmal AFib/Flutter, history of V-fib arrest treated with 1 shock on 06/13/2021  - pt currently not on a/c they had discussed Watchman procedure in the past however he was too high risk for cardiac anesthesia I   - CHADs2VASc score 5 (HF,HTN, age, DM2)  >currently on amiodarone 400 mg twice daily and mexiletine 150 mg 3 times daily.  He has stable potassium of 4.5 and magnesium 2.2 on 7/2  ?  CAD  -?LHC 06/29/17: DES to the CTO left circumflex obtuse marginal-2 by Dr. Alden Server. ?Patent stent in proximal RCA, 30% distal left main stenosis with circumferential calcium noted on the OCT  -?Prior left cardiac catheterization 04/02/17 with DES x2 to the RCA w/ small distal?RCA perforation, trace?pericardial effusion;?known total occlusion of the CX.??Left main and LAD have 30-40%?dz.?  >?Continue ASA 81 mg, atorvastatin 40 mg daily and Plavix 75 mg daily.  ?  PAD  - 10/7 ABI ?+ doppler BLE shows normal ABI to BLE  - 10/7 RLE arterial duplex shows 50-75% stenosis to mid R SFA with elevated velocity, turbulence and ratio  - 10/7 LLE arterial duplex shows 50-75% stenosis to L EIA, 50-75% stenosis to distal L CFA and proximal L SFA w/ elevated velocity and ratio along w/ monophasic waveform; focal stenosis unable  to be ruled out  - 10/8 PV venous insufficiency BLE study shows no residual reflux   - pt established with Dr Elizabeth Palau; plan for conservative treatment with Jonne Ply and statin     CKD: Most recent chemistry on 7/2 showed creatinine 1.66  ?  Chronic bronchitis/COPD?/bronchiectasis  Asthma exacerbation   Prior COVID-19 infection with hospitalization  Oxygen dependent   - Pt follows with Dr. Lambert Mody from Pulmonary team, scheduled to see him on 9/6  - 10/6 Per Pulmonary-continue prednisone 10 mg daily for 3 more days and continue home vest therapy as well as inhalers as prescribed.  -Recently started Nucala injections  ?  Morbid obesity:-?Body mass index is 34.59 kg/m?  OSA:> CPAP nightly    Peripheral neuropathy: He is not scheduled to see Dr. Katrinka Blazing until 8/24, Dr. Sherryll Burger will reach out to Dr. Katrinka Blazing to see if that appointment can be moved up.  He had previously been on Cymbalta however that was discontinued, he has not consistently been taking his Neurontin 400 mg twice daily and was encouraged to take that as scheduled.   Follow up appointment: In 3 months with me.    I have personally documented the HPI, exam and medical decision making.  Patient education: I reviewed recent lab results and current medications, medication instructions, discussed heart failure signs & symptoms,  low  sodium diet, fluid restriction and daily weights.I have instructed the patient on the plan of care and they verbalize understanding of the plan. Please see AVS for full patient teaching. Patient advised to call our office if s/he has any problems, questions, worsening symptoms, or concerns prior to the next appointment.   Thanks for allowing me to see this nice patient. If I can be of additional assistance, please don't hesitate to contact me.       Herby Abraham AGPCNP-BC, CHFN  Advanced Heart Failure APP  [facility]  Collaborating physician: Dr. Vanetta Shawl            Current Medications (including today's revisions)  ? albuterol 0.083% (PROVENTIL) 2.5 mg /3 mL (0.083 %) nebulizer solution Inhale 3 mL solution by nebulizer as directed every 6 hours as needed for Wheezing or Shortness of Breath. Indications: asthma   ? allopurinoL (ZYLOPRIM) 300 mg tablet Take one tablet by mouth daily. take with food   ? amiodarone (PACERONE) 400 mg tablet Take 400 mg BID until follow up with EP   ? amitr-gabapen-emu oil 05-24-08% topical cream (COMPOUND) Apply to affected areas twice daily as needed.   ? arformoteroL (BROVANA) 15 mcg/2 mL nebulizer solution Inhale 2 mL solution by nebulizer as directed twice daily. Dx: J44.9   ? ascorbic acid (vitamin C) 500 mg tablet Take one tablet by mouth daily.   ? ascorbic acid-ascorbate sodium 500 mg chew Chew one tablet by mouth every morning.   ? aspirin EC 81 mg tablet Take one tablet by mouth daily. Take with food.   ? atorvastatin (LIPITOR) 40 mg tablet Take 1 tablet by mouth once daily   ? azelastine (ASTELIN) 137 mcg (0.1 %) nasal spray Apply two sprays to each nostril as directed twice daily. Use in each nostril as directed   ? benzonatate (TESSALON PERLES) 100 mg capsule Take one capsule by mouth every 6 hours as needed for Cough.   ? budesonide (PULMICORT) 0.25 mg/2 mL nebulizer solution Inhale 2 mL solution by nebulizer as directed twice daily. Dx: J44.9   ? bumetanide (BUMEX) 1 mg  tablet Take with 2 mg tablet twice daily to make a total of 3 mg.   ? bumetanide (BUMEX) 2 mg tablet Take one tablet twice daily. Take along with 1 mg tablet for a total of 3 mg twice daily.   ? cholecalciferol (VITAMIN D-3) 1,000 units tablet Take one tablet by mouth daily.   ? clopiDOGreL (PLAVIX) 75 mg tablet Take one tablet by mouth daily.   ? diclofenac sodium (VOLTAREN) 1 % topical gel Apply four g topically to affected area four times daily.   ? DOCOSAHEXAENOIC ACID-EPA PO Take  by mouth daily.   ? docusate (COLACE) 100 mg capsule Take one capsule by mouth daily as needed for Constipation.   ? dulaglutide (TRULICITY) 0.75 mg/0.5 mL injection pen Inject 0.5 mL under the skin every 7 days. Indications: type 2 diabetes mellitus   ? ferrous gluconate 324 mg (37.5 mg iron) tablet Take one tablet by mouth every 48 hours.   ? finasteride (PROSCAR) 5 mg tablet Take one tablet by mouth daily.   ? fish oil- omega 3-DHA/EPA 300/1,000 mg capsule Take one capsule by mouth daily.   ? flash glucose scanning reader (FREESTYLE LIBRE) reader Use as directed.   ? flash glucose sensor (FREESTYLE LIBRE 14 DAY SENSOR) sensor Type 2 diabetes  Indications: type 2 diabetes mellitus   ? gabapentin (NEURONTIN) 100 mg capsule Take four capsules by mouth twice daily.   ? guaiFENesin (ROBITUSSIN) 100 mg/5 mL oral solution Take 10 mL by mouth every 4 hours as needed.   ? HYDROcodone/acetaminophen (NORCO) 5/325 mg tablet Take one tablet by mouth every 6 hours as needed for Pain.   ? insulin pen needles (disposable) (BD ULTRA-FINE MINI PEN NEEDLE) 31 gauge x 3/16 pen needle Use with insulin pens   ? ipratropium bromide (ATROVENT) 21 mcg (0.03 %) nasal spray Apply two sprays to each nostril as directed every 12 hours.   ? magnesium oxide 400 mg magnesium capsule Take one capsule by mouth twice daily.   ? mepolizumab (NUCALA) 100 mg/mL injection pen Inject 100 mg under the skin every 28 days.   ? metOLazone (ZAROXOLYN) 2.5 mg tablet Take one tablet by mouth daily as needed. PER OUTPATIENT CARDIOLOGY PROVIDER AS NEEDED FOR FLUID RETENTION   ? metoprolol succinate XL (TOPROL XL) 25 mg extended release tablet Take one-half tablet by mouth daily.   ? mexiletine (MEXITIL) 150 mg capsule Take 150 mg three times daily until follow up with EP  Indications: ventricular arrhythmias, a type of abnormal heart rhythm   ? Miscellaneous Medical Supply misc itted for LE gradual compression stockings at a medical supply store   ? Miscellaneous Medical Supply misc Rx: Please wrap legs with ACE bandage from toes as high up to the leg as possible bilaterally  Dx: Lymphedema   ? montelukast (SINGULAIR) 10 mg tablet Take one tablet by mouth at bedtime daily.   ? multivit-iron-FA-calcium-mins (THERA-M) 9 mg iron-400 mcg tablet Take one tablet by mouth daily.   ? nitroglycerin (NITROSTAT) 0.4 mg tablet Place one tablet under tongue every 5 minutes as needed for Chest Pain. Max of 3 tablets, call 911.   ? pantoprazole DR (PROTONIX) 40 mg tablet Take one tablet by mouth twice daily.   ? polyethylene glycol 3350 (MIRALAX) 17 g packet Take one packet by mouth daily as needed.   ? potassium chloride (KLOR-CON M10) 10 mEq tablet Take two tablets by mouth daily. Take with a meal and a full glass of water.   ?  sodium chloride (HYPER-SAL) 3.5 % inhalation solution Inhale 4 mL by mouth into the lungs twice daily. 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 spironolactone (ALDACTONE) 25 mg tablet Take one-half tablet by mouth daily. Take with food.   ? tamsulosin (FLOMAX) 0.4 mg capsule TAKE 1 CAPSULE BY MOUTH ONCE DAILY (DO  NOT  CRUSH,  CHEW  OR  OPEN  CAPSULES  (TAKE  30  MINUTES  FOLLOWING  THE  SAME  MEAL  EACH  DAY).   ? thiamine mononitrate (vit B1) 100 mg tablet Take one tablet by mouth daily.   ? traMADoL (ULTRAM) 50 mg tablet Take one tablet by mouth every 6 hours as needed for Pain.   ? venlafaxine XR (EFFEXOR XR) 75 mg capsule Take one capsule by mouth daily. Take with food.     Total Time Today was 60 minutes in the following activities: Preparing to see the patient, Obtaining and/or reviewing separately obtained history, Performing a medically appropriate examination and/or evaluation, Counseling and educating the patient/family/caregiver, Ordering medications, tests, or procedures, Referring and communication with other health care professionals (when not separately reported), Documenting clinical information in the electronic or other health record, Independently interpreting results (not separately reported) and communicating results to the patient/family/caregiver and Care coordination (not separately reported) face to face [id number]     Date of Service: 08/24/2021    Ruben Allan. Ruben Wood is a 81 y.o. male.       HPI            Vitals:    08/24/21 1035   BP: 99/62   BP Source: Arm, Right Upper   Pulse: 60   Temp: 36.2 ?C (97.2 ?F)   SpO2: 99%   O2 Device: Nasal cannula   O2 Liter Flow: 4 Lpm   TempSrc: Skin   PainSc: Ten   Weight: 113.7 kg (250 lb 11.2 oz)  Comment: used wheelchair ramp   Height: 180.3 cm (5' 11)     Body mass index is 34.97 kg/m?[name]     Past Medical History  Patient Active Problem List    Diagnosis Date Noted   ? Ventricular tachycardia (HCC) 08/18/2021   ? ICD (implantable cardioverter-defibrillator) malfunction 08/09/2021   ? ICD (implantable cardioverter-defibrillator) malfunction, initial encounter 08/09/2021   ? Chronic respiratory failure with hypoxia (HCC) 07/20/2021   ? Ventricular fibrillation (HCC) 05/23/2021   ? Moderate mitral regurgitation 05/23/2021   ? Sacroiliitis (HCC) 04/26/2021   ? ICD (implantable cardioverter-defibrillator) discharge 11/24/2020     11/24/20 Successful Dual-chamber cardiac resynchronization therapy (CRT) defibrillator generator replacement ([facility]) by Dr. Milas Kocher       ? Iron deficiency anemia 09/30/2020   ? Peripheral arterial disease (HCC) 06/22/2020   ? Epistaxis 05/14/2020     Was a problem after starting anticoagulation 04/2020, but this has been an ongoing issue for him. On O2 via NC at baseline.         ? Gout 04/03/2020   ? Dyspnea 04/02/2020   ? 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 Chronic bronchitis (HCC) 11/25/2019   ? Chronic bronchitis with acute exacerbation (HCC) 12/05/2018   ? Acute heart failure with reduced ejection fraction and diastolic dysfunction (HCC) 12/05/2018   ? 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 Type 2 diabetes mellitus with hyperglycemia, without long-term current use of insulin (HCC) 03/12/2018   ? Atrial fibrillation (HCC) 03/12/2018   ?  Orthostatic hypotension 12/28/2017   ? Chronic respiratory failure with hypercapnia (HCC) 11/16/2017   ? Depression 11/15/2017   ? OAB (overactive bladder)      Urinary frequency, urgency, urge urinary incontinence (UUI), nocturia, nocturnal enuresis.  -- trial Mirabegron 25 mg --> improved, but persistent sx's.  -- trial Mirabegron 50 mg.     ? Urinary incontinence, urge      See OAB A&P note.     ? Bronchiectasis with acute lower respiratory infection (HCC) 05/10/2017   ? 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     ? Benign prostatic hyperplasia (BPH) 03/01/2017     TRUS Prostate (03/01/2017): Prostate volume = 38.3 mL.  D/c'd Tamsulosin d/t lack of symptom improvement.     ? Enrolled in chronic care management 02/28/2017   ? History of elevated prostate specific antigen (PSA)      PNBx (03/01/2017): (L) high-grade prostatic intraepithelial neoplasia (HG PIN), 1/6 cores; [name], PA-C.     ? Spondylolisthesis, lumbar region 12/13/2016   ? Lumbar post-laminectomy syndrome      L4-5     ? Spinal stenosis of lumbosacral region 09/20/2016   ? Spondylolisthesis of lumbosacral region 09/20/2016   ? Osteoarthritis of spine with radiculopathy, lumbosacral region 09/20/2016   ? 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 He had a normal response to pneumococca vaccination; he had 17/23 positive serotypes. He has normal tetanus toxoid Ab and CH50.  His T&B cell panel revealed a mildly low B-cell count at 68 right after a time of acute illness.  He had positive diphtheria antibody.    Likely multifactorial with significant comorbidities including frequent steroid use for bronchiectasis exacerbations, frequent infections, and possibly a nutritional component (low total protein).    Previously on doxycycline once daily for recurrent infections, which he initially felt had been tremendously helpful, but he has had a couple episodes of what may be pneumonia in early 2022 when he was off.     Plan:  - Restart doxycycline 100 mg daily.  - Recommend getting the Pneumovax immunization next time he is in clinic in person and will need repeat pneumococcal antibody levels 1 month afterwards once he has had resolution of his COVID issues.          ? Moderate persistent asthma with acute exacerbation 02/25/2016     He has had asthma since sometime in his 20-30s. He gets cough, chest tightness, wheezing, shortness of breath that is year round with worsening in the Spring and Fall.   Triggers: URI, hot/cold air. Additionally he has been diagnosed with bronchiectasis and emphysema; his PFTs suggest both obstructive and restrictive disease and asthma is unlikely to be the sole cause of his dyspnea.    He has negative IgE immunocaps to aeroallergens which makes the possibility of allergic asthma highly unlikely. His IgE level was 17 and he had negative ANCAs, MPO and PR3.     Repeatedly being treated with steroids now for flares.  Also on hypertonic saline nebs, DuoNebs, budesnoide/Brovana via neb, and Singulair as well as aggressive pulmonary clearance with vest and Aerobika.        ? 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 He is not having issues with urticaria.    - We recommend IgE immunocaps  to aeroallergens.      ? Recurrent infections 02/25/2016     Recurrent sinopulmonary infections requiring antibiotics 5-10 times per year.   Cellulitis and non-healing wounds.   Many underlying illnesses predisposing him to these infections in addition to intermittent hypogammaglobulinemia associated with active infections and systemic steroids.       ? S/P left pulmonary artery pressure sensor implant placement (CardioMEMs)  02/02/2016     * Patient has CardioMEMs (Pulmonary Artery Pressure Sensor)* Please call Matthew Folks, CardioMEMs Program Coordinator [phone] or page Heart Failure Rounding Team if patient is admitted or presents to ED*  03/30/15 implant     ? Ischemic cardiomyopathy 01/12/2016   ? ICD (implantable cardioverter-defibrillator), biventricular, in situ 12/07/2015     11/24/20 Successful Dual-chamber cardiac resynchronization therapy (CRT) defibrillator generator replacement ([facility]) by Dr. Milas Kocher     ? Hypercholesterolemia 11/25/2015   ? 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 Bronchiectasis without complication (HCC) 06/18/2015     Non-sputum producer.  Currently on Symbicort.  - Dr. [name] was able to get him a vest to wear at home for secretions and this has helped immensely with his secretions and breathing.      ? Osteoarthritis of spine with radiculopathy, lumbar region 07/28/2014   ? Acute on chronic HFrEF (heart failure with reduced ejection fraction) (HCC) 07/19/2014     11/04/14: EF 20%, severely dilated LV with grade 1 diastolic dysfunction.  11/12/2014 In clinic today, patient appears fairly well compensated.  He does have some LEE, but no significant rales (just some at bases), no JVD sitting upright, and no sx to suggest decompensation.  We will continue metoprolol, spironolactone, and bumex at current doses.    11/17/2014 Edema much improved in lower ext, Cr trending down on last labs.  Will recheck labs today.  Advised to weigh himself daily.  Rechecking BMP today to ensure not overdoing diuretics.     On metoprolol, spironolactone.  Will need to explore allergy to ARB more as patient may benefit from ACEI.      03/30/15 CardioMEMS implant by Dr. Chales Abrahams  7. Normal cardiac output and cardiac index.  8. Normal pulmonary pressures and normal pulmonary capillary wedge pressure.  9. Successful insertion of CardioMEMS pulmonary artery pressure sensor    05/04/2021 - ECHO:  The left ventricular systolic function is severely reduced. The visually estimated ejection fraction is 25%.  Severe LV dilatation, severe global hypokinesis.  The right ventricular size, wall thickness and systolic function are normal. ICD lead is present.  Moderate mitral valve regurgitation, mild tricuspid valve regurgitation.  The pulmonary artery pressure could not be estimated due to inadequate tricuspid regurgitation signal       ? 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      ? Stage 3  chronic kidney disease (HCC) 08/26/2013   ? Mixed dyslipidemia 08/26/2013     Hepatic Function    Lab Results   Component Value Date/Time    ALBUMIN 4.0 11/04/2014 12:26 AM    TOTAL PROTEIN 6.9 11/04/2014 12:26 AM    ALK PHOSPHATASE 61 11/04/2014 12:26 AM    Lab Results   Component Value Date/Time    AST (SGOT) 41* 11/04/2014 12:26 AM    ALT (SGPT) 19 11/04/2014 12:26 AM    TOTAL BILIRUBIN 1.4* 11/04/2014 12:26 AM        Lab Results   Component Value Date    CHOL 148 12/15/2013    TRIG 122 12/15/2013    HDL 51 12/15/2013    LDL 88 12/15/2013    VLDL 24 12/15/2013    NONHDLCHOL 97 12/15/2013       BP Readings from Last 3 Encounters:   11/12/14 129/80   11/09/14 111/44   11/04/14 146/65     Plan:   81 y.o. with known CAD with CTO of RCA and obtuse marginal branch with high grade stenosis (90%) in mid left circ.    Advised heart healthy diet and daily exercise.    Continue high intensity statin, atorvastatin       ? AAA (abdominal aortic aneurysm) (HCC) 10/15/2012     Duplex done at OSH in 2007 and 2008 ~ 4.1 cm    Duplex 10/15/12-AAA 7.3 cm AP x 7.3 cm       ? History of MRSA infection 10/14/2012   ? CAD (coronary artery disease), native coronary artery 08/15/2012     07/11/2002- Cath @ [facility] showed Severe double vessel disease(OMB of circumflex and distal circ)  inferior-basilar dyskinesis.                  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    09/12/12   Cath - [place]:  CTO of the right coronary artery, CTO of the obtuse marginal branch and high-grade stenosis of   90% in the mid left circumflex artery No significant stenosis in the left anterior descending artery or left main vessel     Failed attempt in opening 2nd obtuse marginal CTO as described above.    10/14/12: Unsuccessful attempt to open CTO of OMB and mid-CFX by Dr. Mackey Birchwood      04/02/17: Cath by Dr. Steward Ros:   10. Chronic total occlusion of the circumflex.  11. Chronic total occlusion of the right coronary artery, status post percutaneous coronary intervention with drug-eluting stent times 2.  12. Small distal right coronary artery perforation without echo or hemodynamic evidence of compromise.    06/29/17-cardiac catheterization by Dr. Steward Ros. One DES placed to chronic total occlusion of the circumflex artery. The RCA stent was patent.    05/23/2021 - Cardiac Catheterization:  Severe distal left main calcified stenosis confirmed with abnormal resting full-cycle ratio and abnormal intravascular ultrasound.  70% ostial stenosis and a jailed 1st marginal artery, which is jailed by a circumflex artery stent.  Normal left ventricular filling pressures and central aortic pressure.    05/27/2021 - Cardiac Catheterization:  Successful percutaneous coronary intervention of the left main coronary artery into the proximal left anterior descending artery with a 4.0 X 18 mm Xience Skypoint DES post-dilated with 5.0 NC.  We used Impella CP for  hemodynamic support.  Shockwave lithotripsy with a 4.0 mm C2 IVL catheter, and intravascular ultrasound were utilized for the procedure.  Balloon angioplasty of the proximal left circumflex artery with a 2.5 mm balloon.  The minimum stent area of the proximal left anterior descending artery was 12.8 mm, distal left main was 16.5 mm, and the ostial left main was 17.4 sq mm.     ? OSA on CPAP 08/13/2012     CPAP at  DME: [facility]    Split night:  08/14/2017  AHI 43.2  REM n/a  Time below 88 %: 98 mins  Lowest sat: 83%     CPAP 10 cm h20 effective      ? COPD (chronic obstructive pulmonary disease) (HCC) 08/13/2012     04/28/13: PFTs with more restrictive pattern with FEV1 50%, FEV1/FVC 89%, DLCO 65%.    12/27/13: PFTs with FEV1 82%, FEV1/FVC 113%, and DLCO 77%     ? 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  Discussed the importance of diet.  Exercise as tolerated.        ? Essential hypertension 08/13/2012   ? Chest wall deformity 07/09/2012     Chest wall reconstruction on 07/09/12  Lung herniation- bio bridge           Review of Systems   Constitutional: Positive for malaise/fatigue.   Psychiatric/Behavioral: The patient is nervous/anxious.    All other systems reviewed and are negative.      Physical Exam      Cardiovascular Studies      Cardiovascular Health Factors  Vitals BP Readings from Last 3 Encounters:   08/24/21 99/62   08/21/21 107/44   08/12/21 124/89     Wt Readings from Last 3 Encounters:   08/24/21 113.7 kg (250 lb 11.2 oz)   08/21/21 110.3 kg (243 lb 3.2 oz)   08/11/21 119.6 kg (263 lb 10.7 oz)     BMI Readings from Last 3 Encounters:   08/24/21 34.97 kg/m?   08/21/21 33.92 kg/m?   08/11/21 36.77 kg/m?      Smoking Social History     Tobacco Use   Smoking Status Former   ? Packs/day: 2.00   ? Years: 40.00   ? Pack years: 80.00   ? Types: Cigarettes   ? Quit date: 07/09/1998   ? Years since quitting: 23.1   Smokeless Tobacco Never   Vaping Use   ? Vaping Use: Never used      Lipid Profile Cholesterol   Date Value Ref Range Status   04/04/2020 161 <200 MG/DL Final     HDL   Date Value Ref Range Status   04/04/2020 53 >40 MG/DL Final     LDL   Date Value Ref Range Status   04/04/2020 87 <100 mg/dL Final     Triglycerides   Date Value Ref Range Status   04/04/2020 152 (H) <150 MG/DL Final      Blood Sugar Hemoglobin A1C   Date Value Ref Range Status   05/11/2021 6.1 (H) 4.0 - 5.7 % Final     Comment:     The ADA recommends that most patients with type 1 and type 2 diabetes maintain  an A1c level <7%.       Glucose   Date Value Ref Range Status   08/21/2021 99 70 - 100 MG/DL Final   16/11/9602 540 (H) 70 - 100 MG/DL Final   98/12/9145 829 (H) 70 - 100 MG/DL Final     Glucose Fasting   Date Value Ref Range Status   12/15/2014 111  Final     Glucose, POC   Date Value Ref Range Status   08/21/2021 151 (H) 70 - 100 MG/DL Final   56/21/3086 578 (H) 70 - 100 MG/DL Final   46/96/2952 841 (H) 70 - 100 MG/DL Final          Problems Addressed Today  No diagnosis found.    Assessment and Plan            Current Medications (including today's revisions)  ? albuterol 0.083% (PROVENTIL) 2.5 mg /3 mL (0.083 %) nebulizer solution Inhale 3 mL solution by nebulizer as directed every 6 hours as needed for Wheezing or Shortness of Breath. Indications: asthma   ? allopurinoL (ZYLOPRIM) 300 mg tablet Take one tablet by mouth daily. take with food   ? amiodarone (PACERONE) 400 mg tablet Take 400 mg BID until follow up with EP   ? amitr-gabapen-emu oil 05-24-08% topical cream (COMPOUND) Apply to affected areas twice daily as needed.   ? arformoteroL (BROVANA) 15 mcg/2 mL nebulizer solution Inhale 2 mL solution by nebulizer as directed twice daily. Dx: J44.9   ? ascorbic acid (vitamin C) 500 mg tablet Take one tablet by mouth daily.   ? ascorbic acid-ascorbate sodium 500 mg chew Chew one tablet by mouth every morning.   ? aspirin EC 81 mg tablet Take one tablet by mouth daily. Take with food.   ? atorvastatin (LIPITOR) 40 mg tablet Take 1 tablet by mouth once daily   ? azelastine (ASTELIN) 137 mcg (0.1 %) nasal spray Apply two sprays to each nostril as directed twice daily. Use in each nostril as directed   ? benzonatate (TESSALON PERLES) 100 mg capsule Take one capsule by mouth every 6 hours as needed for Cough.   ? budesonide (PULMICORT) 0.25 mg/2 mL nebulizer solution Inhale 2 mL solution by nebulizer as directed twice daily. Dx: J44.9   ? bumetanide (BUMEX) 1 mg tablet Take with 2 mg tablet twice daily to make a total of 3 mg.   ? bumetanide (BUMEX) 2 mg tablet Take one tablet twice daily. Take along with 1 mg tablet for a total of 3 mg twice daily.   ? cholecalciferol (VITAMIN D-3) 1,000 units tablet Take one tablet by mouth daily.   ? clopiDOGreL (PLAVIX) 75 mg tablet Take one tablet by mouth daily.   ? diclofenac sodium (VOLTAREN) 1 % topical gel Apply four g topically to affected area four times daily.   ? DOCOSAHEXAENOIC ACID-EPA PO Take  by mouth daily.   ? docusate (COLACE) 100 mg capsule Take one capsule by mouth daily as needed for Constipation.   ? dulaglutide (TRULICITY) 0.75 mg/0.5 mL injection pen Inject 0.5 mL under the skin every 7 days. Indications: type 2 diabetes mellitus   ? ferrous gluconate 324 mg (37.5 mg iron) tablet Take one tablet by mouth every 48 hours.   ? finasteride (PROSCAR) 5 mg tablet Take one tablet by mouth daily.   ? fish oil- omega 3-DHA/EPA 300/1,000 mg capsule Take one capsule by mouth daily.   ? flash glucose scanning reader (FREESTYLE LIBRE) reader Use as  directed.   ? flash glucose sensor (FREESTYLE LIBRE 14 DAY SENSOR) sensor Type 2 diabetes  Indications: type 2 diabetes mellitus   ? gabapentin (NEURONTIN) 100 mg capsule Take four capsules by mouth twice daily.   ? guaiFENesin (ROBITUSSIN) 100 mg/5 mL oral solution Take 10 mL by mouth every 4 hours as needed.   ? HYDROcodone/acetaminophen (NORCO) 5/325 mg tablet Take one tablet by mouth every 6 hours as needed for Pain.   ? insulin pen needles (disposable) (BD ULTRA-FINE MINI PEN NEEDLE) 31 gauge x 3/16 pen needle Use with insulin pens   ? ipratropium bromide (ATROVENT) 21 mcg (0.03 %) nasal spray Apply two sprays to each nostril as directed every 12 hours.   ? magnesium oxide 400 mg magnesium capsule Take one capsule by mouth twice daily.   ? mepolizumab (NUCALA) 100 mg/mL injection pen Inject 100 mg under the skin every 28 days.   ? metOLazone (ZAROXOLYN) 2.5 mg tablet Take one tablet by mouth daily as needed. PER OUTPATIENT CARDIOLOGY PROVIDER AS NEEDED FOR FLUID RETENTION   ? metoprolol succinate XL (TOPROL XL) 25 mg extended release tablet Take one-half tablet by mouth daily.   ? mexiletine (MEXITIL) 150 mg capsule Take 150 mg three times daily until follow up with EP  Indications: ventricular arrhythmias, a type of abnormal heart rhythm   ? Miscellaneous Medical Supply misc itted for LE gradual compression stockings at a medical supply store   ? Miscellaneous Medical Supply misc Rx: Please wrap legs with ACE bandage from toes as high up to the leg as possible bilaterally  Dx: Lymphedema   ? montelukast (SINGULAIR) 10 mg tablet Take one tablet by mouth at bedtime daily.   ? multivit-iron-FA-calcium-mins (THERA-M) 9 mg iron-400 mcg tablet Take one tablet by mouth daily.   ? nitroglycerin (NITROSTAT) 0.4 mg tablet Place one tablet under tongue every 5 minutes as needed for Chest Pain. Max of 3 tablets, call 911.   ? pantoprazole DR (PROTONIX) 40 mg tablet Take one tablet by mouth twice daily.   ? polyethylene glycol 3350 (MIRALAX) 17 g packet Take one packet by mouth daily as needed.   ? potassium chloride (KLOR-CON M10) 10 mEq tablet Take two tablets by mouth daily. Take with a meal and a full glass of water.   ? sodium chloride (HYPER-SAL) 3.5 % inhalation solution Inhale 4 mL by mouth into the lungs twice daily. 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 spironolactone (ALDACTONE) 25 mg tablet Take one-half tablet by mouth daily. Take with food.   ? tamsulosin (FLOMAX) 0.4 mg capsule TAKE 1 CAPSULE BY MOUTH ONCE DAILY (DO  NOT  CRUSH,  CHEW  OR  OPEN  CAPSULES  (TAKE  30  MINUTES  FOLLOWING  THE  SAME  MEAL  EACH  DAY).   ? thiamine mononitrate (vit B1) 100 mg tablet Take one tablet by mouth daily.   ? traMADoL (ULTRAM) 50 mg tablet Take one tablet by mouth every 6 hours as needed for Pain.   ? venlafaxine XR (EFFEXOR XR) 75 mg capsule Take one capsule by mouth daily. Take with food.

## 2021-08-26 ENCOUNTER — Encounter: Admit: 2021-08-26 | Discharge: 2021-08-26 | Payer: MEDICARE

## 2021-08-29 ENCOUNTER — Encounter: Admit: 2021-08-29 | Discharge: 2021-08-29 | Payer: MEDICARE

## 2021-08-29 ENCOUNTER — Ambulatory Visit: Admit: 2021-08-29 | Discharge: 2021-08-30 | Payer: MEDICARE

## 2021-08-29 DIAGNOSIS — E119 Type 2 diabetes mellitus without complications: Secondary | ICD-10-CM

## 2021-08-29 DIAGNOSIS — R053 Chronic cough: Secondary | ICD-10-CM

## 2021-08-29 DIAGNOSIS — N401 Enlarged prostate with lower urinary tract symptoms: Secondary | ICD-10-CM

## 2021-08-29 DIAGNOSIS — Z8679 Personal history of other diseases of the circulatory system: Secondary | ICD-10-CM

## 2021-08-29 DIAGNOSIS — J45909 Unspecified asthma, uncomplicated: Secondary | ICD-10-CM

## 2021-08-29 DIAGNOSIS — I10 Essential (primary) hypertension: Secondary | ICD-10-CM

## 2021-08-29 DIAGNOSIS — N183 CKD (chronic kidney disease) stage 3, GFR 30-59 ml/min (HCC): Secondary | ICD-10-CM

## 2021-08-29 DIAGNOSIS — I429 Cardiomyopathy, unspecified: Secondary | ICD-10-CM

## 2021-08-29 DIAGNOSIS — U071 Pneumonia due to COVID-19 virus: Secondary | ICD-10-CM

## 2021-08-29 DIAGNOSIS — M961 Postlaminectomy syndrome, not elsewhere classified: Secondary | ICD-10-CM

## 2021-08-29 DIAGNOSIS — D509 Iron deficiency anemia, unspecified: Secondary | ICD-10-CM

## 2021-08-29 DIAGNOSIS — I714 AAA (abdominal aortic aneurysm) (HCC): Secondary | ICD-10-CM

## 2021-08-29 DIAGNOSIS — I5042 Chronic combined systolic (congestive) and diastolic (congestive) heart failure: Secondary | ICD-10-CM

## 2021-08-29 DIAGNOSIS — I4891 Unspecified atrial fibrillation: Secondary | ICD-10-CM

## 2021-08-29 DIAGNOSIS — I2510 Atherosclerotic heart disease of native coronary artery without angina pectoris: Secondary | ICD-10-CM

## 2021-08-29 DIAGNOSIS — I509 Heart failure, unspecified: Secondary | ICD-10-CM

## 2021-08-29 DIAGNOSIS — L03116 Cellulitis of left lower limb: Secondary | ICD-10-CM

## 2021-08-29 DIAGNOSIS — G4733 Obstructive sleep apnea (adult) (pediatric): Secondary | ICD-10-CM

## 2021-08-29 DIAGNOSIS — Z8614 Personal history of Methicillin resistant Staphylococcus aureus infection: Secondary | ICD-10-CM

## 2021-08-29 DIAGNOSIS — J479 Bronchiectasis, uncomplicated: Secondary | ICD-10-CM

## 2021-08-29 DIAGNOSIS — Z95810 Presence of automatic (implantable) cardiac defibrillator: Secondary | ICD-10-CM

## 2021-08-29 DIAGNOSIS — R0600 Dyspnea, unspecified: Secondary | ICD-10-CM

## 2021-08-29 DIAGNOSIS — M954 Acquired deformity of chest and rib: Secondary | ICD-10-CM

## 2021-08-29 DIAGNOSIS — J449 Chronic obstructive pulmonary disease, unspecified: Secondary | ICD-10-CM

## 2021-08-29 DIAGNOSIS — J189 Pneumonia, unspecified organism: Secondary | ICD-10-CM

## 2021-08-29 DIAGNOSIS — Z9981 Dependence on supplemental oxygen: Secondary | ICD-10-CM

## 2021-08-29 DIAGNOSIS — K5901 Slow transit constipation: Secondary | ICD-10-CM

## 2021-08-29 DIAGNOSIS — K219 Gastro-esophageal reflux disease without esophagitis: Secondary | ICD-10-CM

## 2021-08-29 DIAGNOSIS — Z95828 Presence of other vascular implants and grafts: Secondary | ICD-10-CM

## 2021-08-29 DIAGNOSIS — R609 Edema, unspecified: Secondary | ICD-10-CM

## 2021-08-29 DIAGNOSIS — E1165 Type 2 diabetes mellitus with hyperglycemia: Secondary | ICD-10-CM

## 2021-08-29 DIAGNOSIS — I480 Paroxysmal atrial fibrillation: Secondary | ICD-10-CM

## 2021-08-29 DIAGNOSIS — J302 Other seasonal allergic rhinitis: Secondary | ICD-10-CM

## 2021-08-29 DIAGNOSIS — E782 Mixed hyperlipidemia: Secondary | ICD-10-CM

## 2021-08-29 NOTE — Progress Notes
Bilateral knee trouble

## 2021-08-29 NOTE — Progress Notes
TELEHEALTH NOTE  Date of Service: 08/29/2021    Ruben Barwick. is a 81 y.o. male. DOB: [birth year]   MRN#: [id number]    Subjective:   Chief Complaint   Patient presents with   ? Post-hospital Follow Up     Syncope            History of Present Illness  Ruben Tensley. is 81 y.o. [facility] patient who presents for telehealth visit for a hospital follow up.  He is accompanied with his wife, Ruben Wood today.    He has a history of type 2 diabetes, severe asthma with bronchiectasis, peripheral vascular disease, hyperlipidemia, ischemic cardiomyopathy with ICD implantation,?severely impaired LV ejection fraction,?hypertension, coronary artery disease, stage III CKD, hypergammaglobulinemia and sleep apnea.    He has had 3 recent hospitalizations.  First hospitalization 3/31 - 4/14 with V-fib status post ICD shock, subsequently admitted 5/31- 6/15 for acute on chronic heart failure with reduced ejection fraction, next was hospitalized from 6/20 through 6/23 for ICD malfunction, then most recently 6/296/29 - 7/2 for 2 syncopal events and ICD shock.    Since his hospitalization he has been doing well from a cardiac standpoint.  He denies any shocks or chest pain.  His weight has been stable.  He does continue to have peripheral edema but this is more chronic in nature.  He restarted his spironolactone at 12.5 mg daily.  He continues aspirin with clopidogrel.  He did have a follow-up with cardiology last week.  I reported he was euvolemic.    His main concern today is right knee pain and swelling.  It started to progressively get worse last week and went to the local emergency room.  Also reports they did an x-ray which showed an abnormality in his femur.  They ordered a CT scan which she completed last week.  The results for this are still pending.  Ruben Wood notes there was approximately golf ball size nodule on his right lateral aspect of his knee.  Ruben Wood notes he fell on that leg approximately a year ago.  He did hear a pop in that area about a week ago. He did not have any exudate at the site.  They have been icing it and keeping it elevated.  The redness and pain are gradually improving.  Ruben Wood notes today was the first time he was able to stand on it to take a shower.  He denies any knee instability.  He currently is receiving PT, OT and speech therapy.    His second concern is diarrhea.  He is prone to constipation and was taking MiraLAX and Colace on a daily basis.  Someone mentioned that he might need to take Senokot when he gets constipated.  He took it for 1 week which induced diarrhea so it was stopped.  Ruben Wood notes he started to have clear mucousy bowel movements for couple days.  He then had a couple hard stools followed by softer stools.  He reports he is passing quite a bit of gas but does not have any abdominal pain or bloating.  He is eating and drinking relatively well.  She felt like he was getting constipated again and gave him 2 doses of Senokot yesterday.  Since then he has had more diarrhea and has had a couple near accidents.  They deny any bloody stools or any mucus in the stools within the last couple days.    [facility] Wood notes he has restarted his gabapentin twice daily  which has helped with his neuropathy.  He has not been receiving his venlafaxine because she want to make sure that this was the correct medication for him.  Ruben Wood notes he is anxious and ready to start the venlafaxine.        08/19/21 ECHO Interpretation Summary    Technically difficult study due to suboptimal acoustic windows.  ?  1. Severely dilated left ventricle with mild eccentric hypertrophy. Moderately reduced ejection fraction. By Biplane method-of-discs calculation, EF=35%. There is diffuse hypokinesis. Grade II (moderate) diastolic dysfunction.  Elevated left atrial pressure.  2. Probably normal right ventricle size.  Normal systolic function.  3. Moderately dilated left atrium.  Mildly dilated right atrium.  4. There are pacemaker leads present in the right heart chambers.  5. Nonspecific thickening of mitral valve with mild annular calcification. No stenosis. Moderate regurgitation with an eccentric, posteriorly-directed jet.  Regurgitation severity may be underestimated given the eccentric nature of the jet.  6. Mild tricuspid valve regurgitation.  7. Estimated peak systolic pulmonary artery pressure is 34 mm Hg (~33% systemic pressure).  8. Normal aortic root and proximal ascending thoracic aorta diameters  9. No pericardial effusion.  ?  A serial comparison was performed with a transthoracic echocardiogram from July 21, 2021. Left ventricular size and ejection fraction are similar to prior study.  The mitral valve regurgitation appears similar between both examinations as well.      BP Readings from Last 5 Encounters:   08/24/21 99/62   08/21/21 107/44   08/12/21 124/89   08/04/21 110/52   07/20/21 111/63     Wt Readings from Last 5 Encounters:   08/24/21 113.7 kg (250 lb 11.2 oz)   08/21/21 110.3 kg (243 lb 3.2 oz)   08/11/21 119.6 kg (263 lb 10.7 oz)   08/03/21 119.3 kg (263 lb 0.1 oz)   07/20/21 116.6 kg (257 lb)       Social History     Socioeconomic History   ? Marital status: Married     Spouse name: Ruben Wood   ? Number of children: 0   Occupational History   ? Occupation: former Therapist, music: RETIRED     Comment: lives in a 82 year old house with wife   Tobacco Use   ? Smoking status: Former     Packs/day: 2.00     Years: 40.00     Pack years: 80.00     Types: Cigarettes     Quit date: 07/09/1998     Years since quitting: 23.1   ? Smokeless tobacco: Never   Vaping Use   ? Vaping Use: Never used   Substance and Sexual Activity   ? Alcohol use: Yes     Comment: occasion   ? Drug use: Never   ? Sexual activity: Not Currently       Objective:     ? albuterol 0.083% (PROVENTIL) 2.5 mg /3 mL (0.083 %) nebulizer solution Inhale 3 mL solution by nebulizer as directed every 6 hours as needed for Wheezing or Shortness of Breath. Indications: asthma   ? allopurinoL (ZYLOPRIM) 300 mg tablet Take one tablet by mouth daily. take with food   ? amiodarone (PACERONE) 400 mg tablet Take 400 mg BID until follow up with EP   ? amitr-gabapen-emu oil 05-24-08% topical cream (COMPOUND) Apply to affected areas twice daily as needed.   ? arformoteroL (BROVANA) 15 mcg/2 mL nebulizer solution Inhale 2 mL solution by nebulizer as directed  twice daily. Dx: J44.9   ? ascorbic acid (vitamin C) 500 mg tablet Take one tablet by mouth daily.   ? ascorbic acid-ascorbate sodium 500 mg chew Chew one tablet by mouth every morning.   ? aspirin EC 81 mg tablet Take one tablet by mouth daily. Take with food.   ? atorvastatin (LIPITOR) 40 mg tablet Take 1 tablet by mouth once daily   ? azelastine (ASTELIN) 137 mcg (0.1 %) nasal spray Apply two sprays to each nostril as directed twice daily. Use in each nostril as directed   ? benzonatate (TESSALON PERLES) 100 mg capsule Take one capsule by mouth every 6 hours as needed for Cough.   ? budesonide (PULMICORT) 0.25 mg/2 mL nebulizer solution Inhale 2 mL solution by nebulizer as directed twice daily. Dx: J44.9   ? bumetanide (BUMEX) 1 mg tablet Take with 2 mg tablet twice daily to make a total of 3 mg.   ? bumetanide (BUMEX) 2 mg tablet Take one tablet twice daily. Take along with 1 mg tablet for a total of 3 mg twice daily.   ? cholecalciferol (VITAMIN D-3) 1,000 units tablet Take one tablet by mouth daily.   ? clopiDOGreL (PLAVIX) 75 mg tablet Take one tablet by mouth daily.   ? diclofenac sodium (VOLTAREN) 1 % topical gel Apply four g topically to affected area four times daily.   ? DOCOSAHEXAENOIC ACID-EPA PO Take  by mouth daily.   ? docusate (COLACE) 100 mg capsule Take one capsule by mouth daily as needed for Constipation.   ? dulaglutide (TRULICITY) 0.75 mg/0.5 mL injection pen Inject 0.5 mL under the skin every 7 days. Indications: type 2 diabetes mellitus   ? ferrous gluconate 324 mg (37.5 mg iron) tablet Take one tablet by mouth every 48 hours.   ? finasteride (PROSCAR) 5 mg tablet Take one tablet by mouth daily.   ? fish oil- omega 3-DHA/EPA 300/1,000 mg capsule Take one capsule by mouth daily.   ? flash glucose scanning reader (FREESTYLE LIBRE) reader Use as directed.   ? flash glucose sensor (FREESTYLE LIBRE 14 DAY SENSOR) sensor Type 2 diabetes  Indications: type 2 diabetes mellitus   ? gabapentin (NEURONTIN) 100 mg capsule Take four capsules by mouth twice daily.   ? guaiFENesin (ROBITUSSIN) 100 mg/5 mL oral solution Take 10 mL by mouth every 4 hours as needed.   ? HYDROcodone/acetaminophen (NORCO) 5/325 mg tablet Take one tablet by mouth every 6 hours as needed for Pain.   ? insulin pen needles (disposable) (BD ULTRA-FINE MINI PEN NEEDLE) 31 gauge x 3/16 pen needle Use with insulin pens   ? ipratropium bromide (ATROVENT) 21 mcg (0.03 %) nasal spray Apply two sprays to each nostril as directed every 12 hours.   ? magnesium oxide 400 mg magnesium capsule Take one capsule by mouth twice daily.   ? mepolizumab (NUCALA) 100 mg/mL injection pen Inject 100 mg under the skin every 28 days.   ? metOLazone (ZAROXOLYN) 2.5 mg tablet Take one tablet by mouth daily as needed. PER OUTPATIENT CARDIOLOGY PROVIDER AS NEEDED FOR FLUID RETENTION   ? metoprolol succinate XL (TOPROL XL) 25 mg extended release tablet Take one-half tablet by mouth daily.   ? mexiletine (MEXITIL) 150 mg capsule Take 150 mg three times daily until follow up with EP  Indications: ventricular arrhythmias, a type of abnormal heart rhythm   ? Miscellaneous Medical Supply misc itted for LE gradual compression stockings at a medical supply store   ?  Miscellaneous Medical Supply misc Rx: Please wrap legs with ACE bandage from toes as high up to the leg as possible bilaterally  Dx: Lymphedema   ? montelukast (SINGULAIR) 10 mg tablet Take one tablet by mouth at bedtime daily.   ? multivit-iron-FA-calcium-mins (THERA-M) 9 mg iron-400 mcg tablet Take one tablet by mouth daily.   ? nitroglycerin (NITROSTAT) 0.4 mg tablet Place one tablet under tongue every 5 minutes as needed for Chest Pain. Max of 3 tablets, call 911.   ? pantoprazole DR (PROTONIX) 40 mg tablet Take one tablet by mouth twice daily.   ? polyethylene glycol 3350 (MIRALAX) 17 g packet Take one packet by mouth daily as needed.   ? potassium chloride (KLOR-CON M10) 10 mEq tablet Take two tablets by mouth daily. Take with a meal and a full glass of water.   ? sodium chloride (HYPER-SAL) 3.5 % inhalation solution Inhale 4 mL by mouth into the lungs twice daily. 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 spironolactone (ALDACTONE) 25 mg tablet Take one-half tablet by mouth daily. Take with food.   ? tamsulosin (FLOMAX) 0.4 mg capsule TAKE 1 CAPSULE BY MOUTH ONCE DAILY (DO  NOT  CRUSH,  CHEW  OR  OPEN  CAPSULES  (TAKE  30  MINUTES  FOLLOWING  THE  SAME  MEAL  EACH  DAY).   ? thiamine mononitrate (vit B1) 100 mg tablet Take one tablet by mouth daily.   ? traMADoL (ULTRAM) 50 mg tablet Take one tablet by mouth every 6 hours as needed for Pain.   ? venlafaxine XR (EFFEXOR XR) 75 mg capsule Take one capsule by mouth daily. Take with food.     There were no vitals filed for this visit.  There is no height or weight on file to calculate BMI.     Physical Exam  HENT:      Head: Normocephalic and atraumatic.   Eyes:      Pupils: Pupils are equal, round, and reactive to light.   Pulmonary:      Comments: Wearing oxygen  Neurological:      Mental Status: He is alert.   Psychiatric:         Mood and Affect: Mood normal.         Behavior: Behavior normal.         Thought Content: Thought content normal.         Judgment: Judgment normal.                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  2+ pitting edema  >?Has dual-chamber CRT defibrillator- by Dr. Milas Kocher  > 4/23?Echo:?EF 25%  > 6/23 Echo: Severely dilated left ventricle with mild eccentric hypertrophy. Moderately reduced ejection fraction. By Biplane method-of-discs calculation, EF=35%. There is diffuse hypokinesis. Grade II (moderate) diastolic dysfunction.  Elevated left atrial pressure.  > Continue metolazone 2.5 mg daily as needed for fluid retention  >?Continue Bumex 3 mg twice daily  > Has allergy to ARB.  ?  Coronary artery disease  >?RCA with obtuse marginal branch with high-grade stenosis (90%)?and mid left circumflex.  > 4/23-DES to the L main into the proximal LAD as well as balloon angioplasty of the ostial left circumflex artery  >?Advised on heart healthy diet and exercise  >?Continue high intensity statin,?atorvastatin  > Continue clopidogrel 75 mg daily  > ASA  > Cardiac Diet, 2 L  Fluid restriction  ?  Paroxysmal atrial fibrillation  >?Elevated CHADS-VASc score of at least 5 (age above 3, hypertension, coronary artery disease).  >??5/23 rivaroxaban20 mg was discontinued due to bleeding  >?He is a candidate for left atrial appendage closure  > Continue amiodarone 400 mg daily   >?Keep appoint with cardiology on 08/09/2021  ?  Type 2 diabetes:?Goal A1c less than 7.0%  >?A1c 3/222/23- 6.1%  >?Continue Jardiance 25 mg daily  >?Continue Trulicity 0.75 mg weekly. ?Was decreased from 1.5 mg weekly a week and half ago due to diarrhea   ?  Moderate Persistent asthma and COPD:  >?Continue oxygen supplementation?4/NC  > Continue montelukast 10 mg daily  >?Continue Astelin  >?Continue budesonide nebulizers twice daily  >?Continue Brovana 1 nebulizer twice daily  >?Continue ProAir and albuterol nebulizers as needed  >Continue mepolizumab (Nucala) injections Q 4 weeks    Constipation with diarrhea:  > We will go ahead and get a KUB to make sure he does not have any constipation with diarrhea leaking around the potential impaction.  [place] is can be at [place] tomorrow  > We did discuss healthy bowel regimen which includes Colace to make the stool slippery and soft and MiraLAX as an osmotic laxative daily.  He can use Senokot as an as needed basis for constipation.    RIGHT knee pain/swelling:  > Went to local [facility] and x-ray and had subsiquent CT of knee- results pending  > Symptoms are improving but still has a red nodule.  We will wait to see what CT results say.  However it is reassuring his symptoms are improving.    Follow-up with PCP in 2 months.    Ruben Lame Berenice Bouton. was seen today for post-hospital follow up.    Diagnoses and all orders for this visit:    Slow transit constipation    Chronic combined systolic (congestive) and diastolic (congestive) heart failure (HCC)    ICD (implantable cardioverter-defibrillator), biventricular, in situ    Coronary artery disease involving native coronary artery of native heart without angina pectoris    Type 2 diabetes mellitus with hyperglycemia, without long-term current use of insulin (HCC)    Paroxysmal atrial fibrillation [facility])           Future Appointments   Date Time Provider Department Center   08/30/2021  3:00 PM Kathrin Penner [facility] Urology   09/29/2021 12:00 AM [facility]IT[facility] CVM Procedur   10/13/2021  2:40 PM Joanie Coddington, MD [id number] SPINE   10/26/2021  9:00 AM PF LAB SCHEDULE C [id number] None   10/26/2021 10:20 AM Deveron Furlong, MD MPAPULM IM   10/31/2021 12:00 AM [facility]IT[facility] CVM Procedur   11/29/2021  1:30 PM Fredricka Bonine, APRN-NP [id number] CVM Exam   12/01/2021 12:00 AM [facility] CVM Procedur   01/02/2022 12:00 AM [facility]IT[facility] CVM Procedur   01/24/2022  1:45 PM Donnelly Stager, MD [email or web address] CVM Exam   02/02/2022 12:00 AM [facility]OTE [facility]ITOR[facility] CVM Procedur   03/06/2022 12:00 AM [facility]OTE [facility]IT[facility] CVM Procedur   04/06/2022 12:00 AM [facility] REMOTE MONITORING [facility]CREMOTE[facility]M CVM Procedur   05/08/2022 12:00 AM [facility]OTE [facility]IT[facility] CVM Procedur   06/08/2022 12:00 AM [facility]IT[facility] CVM Procedur       Alastair Hennes A Enedelia Martorelli, APRN-NP             Total of 40 minutes were spent on the  same day of the visit including preparing to see the patient, obtaining and/or reviewing separately obtained history, performing a medically appropriate examination and/or evaluation, counseling and educating the patient/family/caregiver, ordering medications, tests, or procedures, referring and communication with other health care professionals, documenting clinical information in the electronic or other health record, independently interpreting results and communicating results to the patient/family/caregiver, and care coordination. I used a dictation program when conducting this note. Due to this, there may be grammatical errors or inconsistencies. For clarification or questions, please contact me.

## 2021-08-29 NOTE — Patient Instructions
It was great seeing you today!       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  2+ pitting edema  > Has dual-chamber CRT defibrillator- by Dr. Milas Kocher  > 4/23 Echo: EF 25%  > 6/23 Echo: Severely dilated left ventricle with mild eccentric hypertrophy. Moderately reduced ejection fraction. By Biplane method-of-discs calculation, EF=35%. There is diffuse hypokinesis. Grade II (moderate) diastolic dysfunction.  Elevated left atrial pressure.  > Continue metolazone 2.5 mg daily as needed for fluid retention  > Continue Bumex 3 mg twice daily  > Has allergy to ARB.     Coronary artery disease  > RCA with obtuse marginal branch with high-grade stenosis (90%) and mid left circumflex.  > 4/23-DES to the L main into the proximal LAD as well as balloon angioplasty of the ostial left circumflex artery  > Advised on heart healthy diet and exercise  > Continue high intensity statin, atorvastatin  > Continue clopidogrel 75 mg daily  > ASA  > Cardiac Diet, 2 L Fluid restriction     Paroxysmal atrial fibrillation  > Elevated CHADS-VASc score of at least 5 (age above 51, hypertension, coronary artery disease).  >  5/23 rivaroxaban20 mg was discontinued due to bleeding  > He is a candidate for left atrial appendage closure  > Continue amiodarone 400 mg daily   > Keep appoint with cardiology on 08/09/2021     Type 2 diabetes: Goal A1c less than 7.0%  > A1c 3/222/23- 6.1%  > Continue Jardiance 25 mg daily  > Continue Trulicity 0.75 mg weekly.  Was decreased from 1.5 mg weekly a week and half ago due to diarrhea      Moderate Persistent asthma and COPD:  > Continue oxygen supplementation 4/NC  > Continue montelukast 10 mg daily  > Continue Astelin  > Continue budesonide nebulizers twice daily  > Continue Brovana 1 nebulizer twice daily  > Continue ProAir and albuterol nebulizers as needed  >Continue mepolizumab (Nucala) injections Q 4 weeks    Constipation with diarrhea:  > We will go ahead and get a KUB to make sure he does not have any constipation with diarrhea leaking around the potential impaction.  He is can be at [facility] tomorrow  > We did discuss healthy bowel regimen which includes Colace to make the stool slippery and soft and MiraLAX as an osmotic laxative daily.  He can use Senokot as an as needed basis for constipation.    RIGHT knee pain/swelling:  > Went to local ED and x-ray and had subsiquent CT of knee- results pending  > Symptoms are improving but still has a red nodule.  We will wait to see what CT results say.  However it is reassuring his symptoms are improving.    Follow-up with PCP in 2 months.    Please do not hesitate to reach out to our clinic if you have any questions or concerns.  Take Care,  Budd Palmer, APRN-NP      Routine Clinic Information:  How to reach Dionne Milo: Please send a MyChart message to the General Medicine clinic or call my nurse, Sandrea Hammond, at [phone]. When calling please leave your name (including spelling), date of birth, phone number where you can be reached and a description of why you are calling. Sandrea Hammond will return your call as soon as possible.  Sometimes the clinic can get very  busy and we might not be able to get to your phone call until later in the day.  If you need to send a fax to our office, our fax number is [phone].    For urgent issues after business hours/weekends/holidays call [phone] and request for the outpatient internal medicine physician to be paged    We offer same day appointments for your acute health concerns. These appointments are on a first come, first serve basis. I am available for these appointments daily- just ask for [facility] if you would like to be seen. Please call [phone] if you would like to make an appointment.     Lab Results: Due to the CARES act, as of April 1st, 2021 lab results automatically release to MyChart. Dionne Milo will continue to send you a result note on any labs that she orders. With these changes you may see your results before she does. Critical lab results will be addressed immediately. Please allow up to 72 hours for Dionne Milo to review and respond to your results before reaching out with any questions.    For refills on medications, please have your pharmacy fax a refill authorization request form to our office at Fax) [phone]. Please allow at least 3 business days for refill requests.

## 2021-08-30 ENCOUNTER — Encounter: Admit: 2021-08-30 | Discharge: 2021-08-30 | Payer: MEDICARE

## 2021-08-30 DIAGNOSIS — K5901 Slow transit constipation: Secondary | ICD-10-CM

## 2021-08-30 MED FILL — RXAMB AMITR-GABAPEN-EMU OIL 4-4-10% TOPICAL CREAM (COMPOUND): 25 days supply | Qty: 2 | Fill #2 | Status: AC

## 2021-08-31 ENCOUNTER — Encounter: Admit: 2021-08-31 | Discharge: 2021-08-31 | Payer: MEDICARE

## 2021-08-31 NOTE — Progress Notes
Pharmacy Medication Re-assessment    The patient's caregiver (wife) participated on the patient's behalf. All references to the patient herein were completed with the caregiver on the patient's behalf.    Indication/Regimen  The regimen of NUCALA 100 MG/ML SC ATIN indefinitely is appropriate for Ruben Wood. who has Asthma.    Renal dose adjustments are not required. Hepatic dose adjustments are not required. Dose titration is not required.    The medication(s) will be administered in clinic.    Baseline Characteristics  Previous asthma medications: Prednisone  Current asthma medications: Brovana, Pulmicort, Nucala  Inadequate control with inhaled glucocorticoids: yes    Therapeutic Goals and Monitoring  The goal of therapy is to decrease the rate of asthma exacerbations.    Evaluation (past 30 days):  Symptom assessment: unable to assess  Too soon to assess  Hospitalizations for asthma: 0  ED/urgent care visits for asthma: 0    The patient is making progress toward achieving their therapeutic goal. The plan is to continue current therapy.    Past Medical History and Comorbidities  Patient Active Problem List   Diagnosis   ? Chest wall deformity   ? OSA on CPAP   ? COPD (chronic obstructive pulmonary disease) (HCC)   ? Morbid obesity with BMI of 40.0-44.9, adult (HCC)   ? Essential hypertension   ? CAD (coronary artery disease), native coronary artery   ? History of MRSA infection   ? AAA (abdominal aortic aneurysm) (HCC)   ? History of repair of aneurysm of abdominal aorta using endovascular stent graft   ? Stage 3 chronic kidney disease (HCC)   ? Mixed dyslipidemia   ? Chronic total occlusion of native coronary artery   ? Acute on chronic HFrEF (heart failure with reduced ejection fraction) (HCC)   ? Osteoarthritis of spine with radiculopathy, lumbar region   ? Mixed restrictive and obstructive lung disease (HCC)   ? Bronchiectasis without complication (HCC)   ? Hypercholesterolemia   ? ICD (implantable cardioverter-defibrillator), biventricular, in situ   ? Ischemic cardiomyopathy   ? S/P left pulmonary artery pressure sensor implant placement (Card[employer]MEMs)    ? Moderate persistent asthma with acute exacerbation   ? Dermatographic urticaria   ? Recurrent infections   ? Hypogammaglobulinemia (HCC)   ? Spinal stenosis of lumbosacral region   ? Spondylolisthesis of lumbosacral region   ? Osteoarthritis of spine with radiculopathy, lumbosacral region   ? Lumbar post-laminectomy syndrome   ? Spondylolisthesis, lumbar region   ? Enrolled in chronic care management   ? History of elevated prostate specific antigen (PSA)   ? 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 Bronchiectasis with acute lower respiratory infection (HCC)   ? Benign prostatic hyperplasia (BPH)   ? OAB (overactive bladder)   ? Urinary incontinence, urge   ? Depression   ? Chronic respiratory failure with hypercapnia (HCC)   ? Orthostatic hypotension   ? Moderate episode of recurrent major depressive disorder (HCC)   ? Type 2 diabetes mellitus with hyperglycemia, without long-term current use of insulin (HCC)   ? Atrial fibrillation (HCC)   ? Chronic bronchitis with acute exacerbation (HCC)   ? Acute heart failure with reduced ejection fraction and diastolic dysfunction (HCC)   ? Chronic combined systolic (congestive) and diastolic (congestive) heart failure (HCC)   ? Chronic bronchitis (HCC)   ? Oxygen dependent   ? Dyspnea   ? Gout   ? Epistaxis   ? Peripheral arterial  disease (HCC)   ? Iron deficiency anemia   ? ICD (implantable cardioverter-defibrillator) discharge   ? Sacroiliitis (HCC)   ? Ventricular fibrillation (HCC)   ? Moderate mitral regurgitation   ? Chronic respiratory failure with hypoxia (HCC)   ? ICD (implantable cardioverter-defibrillator) malfunction   ? ICD (implantable cardioverter-defibrillator) malfunction, initial encounter   ? Ventricular tachycardia [facility])     Additional comorbidities: no    Labs and Diagnostic Tests  Lab Results   Component Value Date    FVCPRE 2.32 01/16/2017    FVCPRE 2.72 10/18/2016    FVCPRE 3.01 12/22/2014    FVCPREDPRE 53 01/16/2017    FVCPREDPRE 63 10/18/2016    FVCPREDPRE 72 12/22/2014    FEV1PRE 1.65 01/16/2017    FEV1PRE 2.17 10/18/2016    FEV1PRE 2.38 12/22/2014    FEV1PREDPRE 53 01/16/2017    FEV1PREDPRE 70 10/18/2016    FEV1PREDPRE 79 12/22/2014     Allergies   Allergen Reactions   ? Chlorhexidine BLISTERS and EDEMA   ? Spironolactone HYPOTENSION     Team believes 2/2 VT not spironolactone. Difficult to discern. Retrialing 08/20/21   ? Jardiance [Empagliflozin] UNKNOWN     UTI   ? Levofloxacin SEE COMMENTS     Prolonged QT      Immunizations  Vaccine history was reviewed with the patient. Education was provided on the importance of completing vaccines, including annual influenza vaccine.    Immunization History   Administered Date(s) Administered   ? COVID-19 (MODERNA BOOSTER), mRNA vacc, 50 mcg/0.25 mL (PF) 05/18/2020   ? COVID-19 (MODERNA), mRNA vacc, 100 mcg/0.5 mL (PF) 04/10/2019, 05/09/2019   ? COVID-19 Bivalent (70YR+)(PFIZER), mRNA vacc, 50mcg/0.3mL 12/06/2020   ? FLU VACCINE >3YO (Preservative Free) 02/07/1999, 11/30/2008, 12/20/2010   ? Flu Vaccine =>3 YO (Historical) 11/22/2009, 12/03/2012   ? Flu Vaccine =>65 YO High-Dose (PF) 11/09/2015, 11/13/2016, 12/18/2017   ? Flu Vaccine =>65 YO High-Dose Quadrivalent (PF) 12/03/2019, 12/06/2020   ? Flu Vaccine Quadrivalent Recombinant =>18 YO PF 02/07/2019   ? Flu Vaccine Trivalent >64 Yo High-dose (Preservative Free) 11/21/2013, 12/04/2014   ? Pneumococcal Vaccine (23-Val Adult) 02/19/2006, 09/11/2006, 04/27/2016, 05/18/2020   ? Pneumococcal Vaccine(13-Val Peds/immunocompromised adult) 05/11/2012, 12/04/2014   ? Varicella-Zoster Vaccine - live (ZOSTAVAX) 12/27/2012     Medication Reconciliation  Medication history and reconciliation were performed (including prescription medications, supplements, over the counter, and herbal products). The medication list was updated and the patient's current medication list is included. The patient was instructed to speak with their health care provider before starting any new drug, including prescription or over the counter, natural / herbal products, or vitamins.    Drug Interactions    Drug-Drug Interactions  Drug-drug interactions were evaluated. There were not clinically significant drug-drug interactions.     Drug-Food Interactions  Drug-food interactions were not evaluated (NA - not oral).    Home Medications    Medication Sig   albuterol 0.083% (PROVENTIL) 2.5 mg /3 mL (0.083 %) nebulizer solution Inhale 3 mL solution by nebulizer as directed every 6 hours as needed for Wheezing or Shortness of Breath. Indications: asthma   allopurinoL (ZYLOPRIM) 300 mg tablet Take one tablet by mouth daily. take with food   amiodarone (PACERONE) 400 mg tablet Take 400 mg BID until follow up with EP   amitr-gabapen-emu oil 05-24-08% topical cream (BATCHED COMPOUND) Apply to affected areas twice daily as needed.   arformoteroL (BROVANA) 15 mcg/2 mL nebulizer solution Inhale 2 mL solution by nebulizer as directed twice  daily. Dx: J44.9   ascorbic acid (vitamin C) 500 mg tablet Take one tablet by mouth daily.   ascorbic acid-ascorbate sodium 500 mg chew Chew one tablet by mouth every morning.   aspirin EC 81 mg tablet Take one tablet by mouth daily. Take with food.   atorvastatin (LIPITOR) 40 mg tablet Take 1 tablet by mouth once daily   azelastine (ASTELIN) 137 mcg (0.1 %) nasal spray Apply two sprays to each nostril as directed twice daily. Use in each nostril as directed   benzonatate (TESSALON PERLES) 100 mg capsule Take one capsule by mouth every 6 hours as needed for Cough.   budesonide (PULMICORT) 0.25 mg/2 mL nebulizer solution Inhale 2 mL solution by nebulizer as directed twice daily. Dx: J44.9   bumetanide (BUMEX) 1 mg tablet Take with 2 mg tablet twice daily to make a total of 3 mg.   bumetanide (BUMEX) 2 mg tablet Take one tablet twice daily. Take along with 1 mg tablet for a total of 3 mg twice daily.   cholecalciferol (VITAMIN D-3) 1,000 units tablet Take one tablet by mouth daily.   clopiDOGreL (PLAVIX) 75 mg tablet Take one tablet by mouth daily.   diclofenac sodium (VOLTAREN) 1 % topical gel Apply four g topically to affected area four times daily.   DOCOSAHEXAENOIC ACID-EPA PO Take  by mouth daily.   docusate (COLACE) 100 mg capsule Take one capsule by mouth daily as needed for Constipation.   dulaglutide (TRULICITY) 0.75 mg/0.5 mL injection pen Inject 0.5 mL under the skin every 7 days. Indications: type 2 diabetes mellitus   ferrous gluconate 324 mg (37.5 mg iron) tablet Take one tablet by mouth every 48 hours.   finasteride (PROSCAR) 5 mg tablet Take one tablet by mouth daily.   fish oil- omega 3-DHA/EPA 300/1,000 mg capsule Take one capsule by mouth daily.   flash glucose scanning reader (FREESTYLE LIBRE) reader Use as directed.   flash glucose sensor (FREESTYLE LIBRE 14 DAY SENSOR) sensor Type 2 diabetes  Indications: type 2 diabetes mellitus   gabapentin (NEURONTIN) 100 mg capsule Take four capsules by mouth twice daily.   guaiFENesin (ROBITUSSIN) 100 mg/5 mL oral solution Take 10 mL by mouth every 4 hours as needed.   HYDROcodone/acetaminophen (NORCO) 5/325 mg tablet Take one tablet by mouth every 6 hours as needed for Pain.   insulin pen needles (disposable) (BD ULTRA-FINE MINI PEN NEEDLE) 31 gauge x 3/16 pen needle Use with insulin pens   ipratropium bromide (ATROVENT) 21 mcg (0.03 %) nasal spray Apply two sprays to each nostril as directed every 12 hours.   magnesium oxide 400 mg magnesium capsule Take one capsule by mouth twice daily.   mepolizumab (NUCALA) 100 mg/mL injection pen Inject 100 mg under the skin every 28 days.   metOLazone (ZAROXOLYN) 2.5 mg tablet Take one tablet by mouth daily as needed. PER OUTPATIENT CARDIOLOGY PROVIDER AS NEEDED FOR FLUID RETENTION   metoprolol succinate XL (TOPROL XL) 25 mg extended release tablet Take one-half tablet by mouth daily.   mexiletine (MEXITIL) 150 mg capsule Take 150 mg three times daily until follow up with EP  Indications: ventricular arrhythmias, a type of abnormal heart rhythm   Miscellaneous Medical Supply misc itted for LE gradual compression stockings at a medical supply store   Miscellaneous Medical Supply misc Rx: Please wrap legs with ACE bandage from toes as high up to the leg as possible bilaterally  Dx: Lymphedema   montelukast (SINGULAIR) 10 mg tablet Take  one tablet by mouth at bedtime daily.   multivit-iron-FA-calcium-mins (THERA-M) 9 mg iron-400 mcg tablet Take one tablet by mouth daily.   nitroglycerin (NITROSTAT) 0.4 mg tablet Place one tablet under tongue every 5 minutes as needed for Chest Pain. Max of 3 tablets, call 911.   pantoprazole DR (PROTONIX) 40 mg tablet Take one tablet by mouth twice daily.   polyethylene glycol 3350 (MIRALAX) 17 g packet Take one packet by mouth daily as needed.   potassium chloride (KLOR-CON M10) 10 mEq tablet Take two tablets by mouth daily. Take with a meal and a full glass of water.   sodium chloride (HYPER-SAL) 3.5 % inhalation solution Inhale 4 mL by mouth into the lungs twice daily. Dx: J47.9   spironolactone (ALDACTONE) 25 mg tablet Take one-half tablet by mouth daily. Take with food.   tamsulosin (FLOMAX) 0.4 mg capsule TAKE 1 CAPSULE BY MOUTH ONCE DAILY (DO  NOT  CRUSH,  CHEW  OR  OPEN  CAPSULES  (TAKE  30  MINUTES  FOLLOWING  THE  SAME  MEAL  EACH  DAY).   thiamine mononitrate (vit B1) 100 mg tablet Take one tablet by mouth daily.   traMADoL (ULTRAM) 50 mg tablet Take one tablet by mouth every 6 hours as needed for Pain.   venlafaxine XR (EFFEXOR XR) 75 mg capsule Take one capsule by mouth daily. Take with food.     Adverse Drug Reactions  Adverse drug reactions were reviewed with the patient.    Significant adverse drug reaction(s) were not identified.    Side effect(s) were not reported.    The patient does not have injection related concerns.    The patient does not have infection related concerns.    Adherence  Refill and adherence history were reviewed with the patient. The patient was educated on the importance of adherence.    Patient is adherent with refills: yes  Patient is meeting refill adherence goal: yes    Patient reported 0 missed doses over the past 30 days.  Significance of missed doses: NA - no missed doses   Patient is meeting reported adherence goal.    Safety Precautions    Risk Evaluation and Mitigation (REMS) Assessment: REMS is not required for this medication.    Safety precautions were addressed and discussed with the patient as applicable.    Contraindications: Ruben Wood. does not have contraindications to this medication.      Pregnancy Status: Male, education not applicable.    Medication Education  Counseling was not completed because patient was previously educated and did not require additional counseling.    Ruben Wood. was given the opportunity to ask questions but did not have any questions at the time. Patient was reminded of the refill process and encouraged to call with questions. The monitoring and follow-up plan was discussed with the patient. The patient was instructed to contact their health care provider if their symptoms or health problems do not get better or if they become worse. The patient should contact the specialty pharmacy at [phone] if they have any questions or concerns regarding their medication therapy. The patient verbalized acceptance and understanding.    Follow-up Plan  The patient will not be reassessed.    The medication(s) will be picked up at [facility]. Ruben Wood. does not wish participate in the specialty pharmacy patient management program. Thus, the patient will be removed from the specialty pharmacy patient  management program. The patient may be re-enrolled at any time by contacting the pharmacist.    Prentiss Bells, [facility]

## 2021-09-01 ENCOUNTER — Encounter: Admit: 2021-09-01 | Discharge: 2021-09-01 | Payer: MEDICARE

## 2021-09-02 ENCOUNTER — Encounter: Admit: 2021-09-02 | Discharge: 2021-09-02 | Payer: MEDICARE

## 2021-09-03 ENCOUNTER — Encounter: Admit: 2021-09-03 | Discharge: 2021-09-03 | Payer: MEDICARE

## 2021-09-05 ENCOUNTER — Encounter: Admit: 2021-09-05 | Discharge: 2021-09-05 | Payer: MEDICARE

## 2021-09-05 ENCOUNTER — Ambulatory Visit: Admit: 2021-09-05 | Discharge: 2021-09-05 | Payer: MEDICARE

## 2021-09-05 DIAGNOSIS — L03116 Cellulitis of left lower limb: Secondary | ICD-10-CM

## 2021-09-05 DIAGNOSIS — J302 Other seasonal allergic rhinitis: Secondary | ICD-10-CM

## 2021-09-05 DIAGNOSIS — I509 Heart failure, unspecified: Secondary | ICD-10-CM

## 2021-09-05 DIAGNOSIS — Z9981 Dependence on supplemental oxygen: Secondary | ICD-10-CM

## 2021-09-05 DIAGNOSIS — I5042 Chronic combined systolic (congestive) and diastolic (congestive) heart failure: Secondary | ICD-10-CM

## 2021-09-05 DIAGNOSIS — I429 Cardiomyopathy, unspecified: Secondary | ICD-10-CM

## 2021-09-05 DIAGNOSIS — R609 Edema, unspecified: Secondary | ICD-10-CM

## 2021-09-05 DIAGNOSIS — Z95810 Presence of automatic (implantable) cardiac defibrillator: Secondary | ICD-10-CM

## 2021-09-05 DIAGNOSIS — R053 Chronic cough: Secondary | ICD-10-CM

## 2021-09-05 DIAGNOSIS — U071 Pneumonia due to COVID-19 virus: Secondary | ICD-10-CM

## 2021-09-05 DIAGNOSIS — Z95828 Presence of other vascular implants and grafts: Secondary | ICD-10-CM

## 2021-09-05 DIAGNOSIS — I4891 Unspecified atrial fibrillation: Secondary | ICD-10-CM

## 2021-09-05 DIAGNOSIS — J189 Pneumonia, unspecified organism: Secondary | ICD-10-CM

## 2021-09-05 DIAGNOSIS — Z8679 Personal history of other diseases of the circulatory system: Secondary | ICD-10-CM

## 2021-09-05 DIAGNOSIS — N183 CKD (chronic kidney disease) stage 3, GFR 30-59 ml/min (HCC): Secondary | ICD-10-CM

## 2021-09-05 DIAGNOSIS — N401 Enlarged prostate with lower urinary tract symptoms: Secondary | ICD-10-CM

## 2021-09-05 DIAGNOSIS — R52 Pain, unspecified: Secondary | ICD-10-CM

## 2021-09-05 DIAGNOSIS — K219 Gastro-esophageal reflux disease without esophagitis: Secondary | ICD-10-CM

## 2021-09-05 DIAGNOSIS — R0600 Dyspnea, unspecified: Secondary | ICD-10-CM

## 2021-09-05 DIAGNOSIS — D509 Iron deficiency anemia, unspecified: Secondary | ICD-10-CM

## 2021-09-05 DIAGNOSIS — J45909 Unspecified asthma, uncomplicated: Secondary | ICD-10-CM

## 2021-09-05 DIAGNOSIS — M961 Postlaminectomy syndrome, not elsewhere classified: Secondary | ICD-10-CM

## 2021-09-05 DIAGNOSIS — J449 Chronic obstructive pulmonary disease, unspecified: Secondary | ICD-10-CM

## 2021-09-05 DIAGNOSIS — E782 Mixed hyperlipidemia: Secondary | ICD-10-CM

## 2021-09-05 DIAGNOSIS — I10 Essential (primary) hypertension: Secondary | ICD-10-CM

## 2021-09-05 DIAGNOSIS — M954 Acquired deformity of chest and rib: Secondary | ICD-10-CM

## 2021-09-05 DIAGNOSIS — E119 Type 2 diabetes mellitus without complications: Secondary | ICD-10-CM

## 2021-09-05 DIAGNOSIS — I714 AAA (abdominal aortic aneurysm) (HCC): Secondary | ICD-10-CM

## 2021-09-05 DIAGNOSIS — J479 Bronchiectasis, uncomplicated: Secondary | ICD-10-CM

## 2021-09-05 DIAGNOSIS — M86351 Chronic multifocal osteomyelitis, right femur: Secondary | ICD-10-CM

## 2021-09-05 DIAGNOSIS — I2510 Atherosclerotic heart disease of native coronary artery without angina pectoris: Secondary | ICD-10-CM

## 2021-09-05 DIAGNOSIS — Z8614 Personal history of Methicillin resistant Staphylococcus aureus infection: Secondary | ICD-10-CM

## 2021-09-05 DIAGNOSIS — G4733 Obstructive sleep apnea (adult) (pediatric): Secondary | ICD-10-CM

## 2021-09-05 NOTE — Progress Notes
Date of Service: 09/05/2021    Ruben Wood. is a 81 y.o. male.  HPI:  The patient is an 81 year old male who presents to my clinic for evaluation of his right femur.  He presents with his wife.  The patient and his wife state that over the course of the past several weeks she has had increasing right lateral knee pain.  He was seen by his hometown doctor, Dr. Jason Nest in [facility] some 2-1/2 hours away.  An x-ray of his right knee revealed evidence of chronic osteomyelitis of unknown origin.  Dr. Jason Nest immediately put the patient on IV vancomycin and he was sent to Dr. Tretha Sciara for further evaluation.  Dr. Annia Belt had performed an ulnar nerve procedure on the patient in the past.  This patient has an extensive medical history which includes cardiomyopathy, and abdominal aortic aneurysm, atrial fibrillation, chronic obstructive pulmonary disease on oxygen per nasal cannula.  He is also morbidly obese.  He was kindly referred to my clinic by Dr. Tretha Sciara for further work-up and evaluation of his right knee pain.  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  He denies current fevers or chills but states that he has exquisite right knee pain.  He reports that since being placed on the vancomycin recently, that he has developed an erythematous lesion about the lateral aspect of his distal femur consistent with an impending sinus tract.    Ruben Wood. has a past medical history of AAA (abdominal aortic aneurysm) (HCC) (10/15/2012), Asthma, Atrial fibrillation (HCC), BPH with obstruction/lower urinary tract symptoms, Bronchiectasis (HCC), CAD (coronary artery disease), native coronary artery (08/15/2012) (07/11/2002- Cath @ [facility] showed Severe double vessel disease(OMB of circumflex and distal circ)  inferior-basilar dyskinesis.                 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  09/12/12   Cath ), Cardiomyopathy (HCC) (07/19/2014), Cellulitis of left lower extremity (11/12/2014) (10/2014: admitted with cellulitis, [place] negative for venous clot, MRI without osteomyelitis.    11/12/2014 In clinic today, still with cellulitis.  Per patient and his wife, the erythema may be extending.  Cultures from drainage in hospital with MSSA, but Blood Cx negative.  No e/o osteomyelitis.  My concern is that this antibiotic regimen is not adequately treating his cellulitis.  We will broaden coverage to get MRSA with doxycycline.  Patient and wife given strict call/return criteria.  I will see patient in 3-4 days for re-evaluation.  No fever today.  Plan:  Stop cefpodoxime and start doxycylcine  11/17/2014 Much better, no warmth and erythema minimal.   Plan: continue doxycyline for full 10 day course.   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 (03/09/2014), CKD (chronic kidney disease) stage 3, GFR 30-59 ml/min (HCC) (08/26/2013), COPD (chronic obstructive pulmonary disease) (HCC) (08/13/2012), Diabetes (HCC), Dyspnea, Edema, GERD (gastroesophageal reflux disease) (08/13/2012), History of CHF (congestive heart failure) (08/13/2012), History of MRSA infection (10/14/2012), History of repair of aneurysm of abdominal aorta using endovascular stent graft (08/26/2013) (10/25/12: Successful repair of an abdominal  aortic aneurysm utilizing endovascular technique with a Gore Excluder device with 26 x 14 x 18 cm main endoprosthesis on the right and 13.5 cm contralateral leg device successfully delivered without evidence of any endoleaks and excellent results. 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), Pneumonia due to COVID-19 virus (12/05/2018), and Seasonal allergic reaction.    He has a past surgical history that includes Cardiac catheterization (several years ago) ([place]); doppler echocardiography; lumbar laminectomy; cholecystectomy; Hernia repair; bronchoscopy; heart catheterization; back surgery (Jan 2016); Varicose vein surgery (Left, 01/05/2016) (left small saphenous endovenous ablation with left leg microphlebectomy-Dr. Junita Push); microphlebectomy (Right, 07/06/2016) ([place]); endovenous ablation of the small saphenous vein (Right, 07/06/2016) ([place]); Cardiac defibrillator placement (2017); pacemaker placement; Lung surgery (2015) (Bio bridge in left lung/rib cage was broken  from coughing); and Nerve Surgery (Left, 04/30/2018) (LEFT ULNAR NERVE DECOMPRESSION AT ELBOW performed by Letitia Neri, MD at [facility]).    Ruben Wood's family history includes Cancer in his father and mother; Coronary Artery Disease in his father; None Reported in his sister; Stroke in his sister.      Social History  He is a former smoker having quit back in May 2009.  He reports that he smoked 2 packs a day for 40 years.  He reports occasional alcohol consumption.  He is a retired Warden/ranger.  He is married and presents with his wife.  He denies any illicit drug use.      Review of Systems  He denies fevers or chills.  He reports right knee pain and swelling.  He denies headaches.  He reports inability to bear weight on the right lower extremity currently.  He denies any current chest pain or shortness of breath but is on oxygen per nasal cannula.  He reports difficulty with breathing on a chronic basis.    Vitals:    09/05/21 1112   Resp: 14   Weight: 113.4 kg (250 lb)   Height: 180.3 cm (5' 11)     Body mass index is 34.87 kg/m?[name]     Physical Exam:  He is a morbidly obese male appearing his stated age in no acute distress.  His HEENT exam reveals that he is normocephalic and atraumatic.  His external ocular muscles are intact and his mucous membranes are moist.  His neck is supple.  He is alert and oriented to person place and time.  Chest exam reveals symmetric rise and fall of his chest wall with inspiration and expiration.  Poor effort is noted.  His abdomen is soft.  Bilateral upper extremities appear atraumatic with functional range of motion at the shoulders, elbows, and wrists.  He has palpable radial pulses bilaterally.  He has the ability to make a composite fist bilaterally.  Sensation remains intact to his upper extremities through the radial, median, and ulnar nerve distributions.  Bilateral lower extremities appear atraumatic.  He does have venous stasis changes in his lower extremities bilaterally more severe on the right than the left.  He has a soft tissue wound covered with a Band-Aid over the midshaft of his right tibia.  He has very weakly palpable dorsalis pedis pulses and I am unable to palpate his posterior tibialis pulse bilaterally.  He is spontaneously moving his toes and is able to dorsiflex and plantarflex his ankles without difficulty.  He has decreased sensation in bilateral feet  both dorsally and plantarly as well as into the first webspace.  Evaluation of the right knee reveals a small erythematous region consistent with a boil or impending sinus tract deep to the surface of the skin.  This is just posterior lateral to the lateral femoral condyle.  Palpation in this region is met with some discomfort as he has some mild swelling present as well.  Range of motion about the right knee is met with discomfort.  He reports pain throughout the right knee which is more severe than the left knee which has known arthritic change.    AP and lateral views of the right femur demonstrate chronic periosteal reaction to the right distal femur consistent with chronic osteomyelitis.  There does appear to be extension of the periosteal changes to near the midshaft of the right femur.  There is a cortical defect associated with the lateral distal femur which communicates with the intramedullary canal and a small abscess region laterally as indicated by the CT scan dated August 26, 2021.  These CT scan findings are consistent with a large abscess that extends into the intramedullary canal involving the terminal aspect of the distal femur.  These results were reviewed with Dr. Tomasa Hosteller from the radiology department.    Assessment:  Osteomyelitis of the right distal femur.    Plan:  Given the unusual presentation of this patient with presumed osteomyelitis, I did involve Dr. Deno Lunger, my partner and a musculoskeletal oncologist.  Dr. Rosette Reveal recommended that we obtain an MRI of the right distal femur to rule out a lymphoma and sarcoma involving the right leg.  We will plan to obtain the MRI with and without contrast as soon as possible.  As this patient has rather extensive changes to the distal femur, he would likely require an amputation near the midshaft of his right thigh.  That having been said, he is not a good surgical candidate for this.  I would recommend that he be seen and evaluated by Dr. Rosette Reveal regarding the problem.  I would also recommend that he be seen and evaluated by the infectious diseases team here at [facility] regarding the potential need for suppression in order to avoid a surgical procedure.  It may be that he will also need to be evaluated by a palliative care team.  I had the opportunity to discuss all of this with the patient and his wife and the opportunity to answer all of their questions prior to their departure from clinic today.

## 2021-09-05 NOTE — Telephone Encounter
-----   Message from [place] Raley-Gordon sent at 09/05/2021  4:29 PM CDT -----  Regarding: RE: MRI  9/28 1015 BELL  ----- Message -----  From: Anner Crete, RN  Sent: 09/05/2021   3:28 PM CDT  To: Trula Ore Raley-Gordon; [facility]  Subject: RE: MRI                                          This 1.5 and 3 T conditional We can meet pt in MRI for check  ----- Message -----  From: Jake Shark  Sent: 09/05/2021   2:30 PM CDT  To: [facility]  Subject: MRI                                              MRN:  [id number]     Name:  Ruben Wood     Exam: MRI LOWER EXT JNT WO/W CONT RIGHT     Ordering physician:  Kittie Plater      Pacemaker Dependant: no  On Anti-coag: no  MRI Conditional: yes         GENERATOR ATRIAL LEAD RV LEAD LV LEAD  Manufacturer: St. Jude  St. Jude  St. Jude  [place]. Jude   Model NumberCarolyn Stare HF [id number] Tendril MRI LPA1200M/52cm Durata 7122Q/58cm Quartet 1458QL/86cm  Serial Number: [id number]  Implant Date: [id number] 11/25/2015 11/25/2015 11/25/2015  Device Type: CRT-D     [facility]

## 2021-09-06 ENCOUNTER — Encounter: Admit: 2021-09-06 | Discharge: 2021-09-06 | Payer: MEDICARE

## 2021-09-06 NOTE — Progress Notes
Spoke with pt wife, states that nucala is administered at the hospital and on today patient is on his way to the ER.    The refill was not processed per patient request. Ambulatory pharmacist notified.    Marica Otter  [facility]  [phone]

## 2021-09-06 NOTE — Progress Notes
81 yr old fever chills since last night. Mid sternal chest pain this morning since bending over to put on socks. Pain is worse with deep breath. Provider believes pain is costochondritis. PICC line looks good     Imaging:  CXR pending   Labs:  WBC 8.8, Hgb 9.4, Hct 29, Lactate planning to add on  BNP 4261, TropHS 18, Ddimer pending   Na 136, K 4.3, BUN 32, Cr 1.4  Meds:  Tylenol   Vitals:  101/51 (106-110/baseline)  94/63,  86,  24(at rest) 30(w/talking), 101.4  95%3L NC AOX4   EKG Vpaced   Hx:  Home O2. COPD. CAD. Cardiomyopathy. PVD. DM. Chronic osteomyelitis right distal femur (without an organism isolated)- vancomycin. Obesity     Reason for Transfer:  Too many co morbidities to admit

## 2021-09-07 ENCOUNTER — Encounter: Admit: 2021-09-07 | Discharge: 2021-09-07 | Payer: MEDICARE

## 2021-09-07 NOTE — Telephone Encounter
Christina from radiology scheduling called stating she was on the phone with patient and spouse working on his MRI with cardiac device. She stated he is in [facility] with pneumonia. Pt tried to get into [place] but was not able. Trula Ore stated she is wanting provider to know his condition and where he is hospitalized.     Gwinda Passe, RN

## 2021-09-09 ENCOUNTER — Encounter: Admit: 2021-09-09 | Discharge: 2021-09-09 | Payer: MEDICARE

## 2021-09-12 ENCOUNTER — Encounter: Admit: 2021-09-12 | Discharge: 2021-09-12 | Payer: MEDICARE

## 2021-09-12 MED ORDER — TRULICITY 0.75 MG/0.5 ML SC PNIJ
1 refills | Status: AC
Start: 2021-09-12 — End: ?

## 2021-09-12 NOTE — Progress Notes
Final attempt to reach Ruben Wood. to discuss filling regarding their medication: nucala.  Outreach team unable to contact patient after three calls. Patient unreachable today. Left voice message to return call to specialty pharmacy.Mcneil Sober  Pharmacy Patient Advocate, Specialty Pharmacy  [phone]

## 2021-09-12 NOTE — Telephone Encounter
Hospital Discharge Follow Up      Reached Patient: No, transferred to [facility]  Patient Date of Birth: [birth year]     Admission Information:     Hospital Name: [facility]  Admission Date: 09/06/21    Discharge Date: 09/09/21    Admission Diagnosis: Acute pulmonary edema [facility])?; Pneumonia  Discharge Diagnosis: HFrEF (heart failure with reduced ejection fraction) (HCC) (Primary Dx); Acute on chronic systolic congestive heart failure (HCC); Chronic respiratory failure with hypoxia (HCC);   Osteomyelitis of femur, unspecified laterality, unspecified type (HCC)  Has there been a discharge within the last 30 days? No    Hospital Services: Unplanned  Today's call is 1 (business) days post discharge       Medication Reconciliation    Changes to pre-hospital medications? Yes         predniSONE (DELTASONE) 20 MG tablet?  Take 2 tablets (40 mg total) by mouth daily for 5 days.     amoxicillin-clavulanate (AUGMENTIN) 875-125 MG?   Indications: Chronic respiratory failure with hypoxia (HCC) Take 1 tablet by mouth 2 (two) times daily for 7 days.     budesonide (PULMICORT) 0.5 MG/2ML nebulizer solution?  Take 2 mLs (0.5 mg total) by nebulization 2 (two) times daily     castor oil-balsam (VENELEX) ointment?  Apply topically 2 (two) times daily.     vancomycin (VANCOCIN) 1 g injection?   Indications: Bone Infection Inject 1,250 mg into the vein daily for 4 days. Indications: Bone Infection     Were new prescriptions filled? N/A  Meds reviewed and reconciled? Yes    Current Outpatient Medications   Medication Instructions   ? albuterol 0.083% (PROVENTIL) 2.5 mg /3 mL (0.083 %) nebulizer solution Inhale 3 mL solution by nebulizer as directed every 6 hours as needed for Wheezing or Shortness of Breath. Indications: asthma   ? allopurinoL (ZYLOPRIM) 300 mg, Oral, DAILY, take with food   ? amiodarone (PACERONE) 400 mg tablet Take 400 mg BID until follow up with EP   ? amitr-gabapen-emu oil 05-24-08% topical cream (BATCHED COMPOUND) Apply to affected areas twice daily as needed.   ? arformoteroL (BROVANA) 15 mcg/2 mL nebulizer solution Inhale 2 mL solution by nebulizer as directed twice daily. Dx: J44.9   ? ascorbic acid (vitamin C) (ASCORBIC ACID (VITAMIN C)) 500 mg, Oral, DAILY   ? ascorbic acid-ascorbate sodium 500 mg chew 500 mg, Oral, EVERY MORNING   ? aspirin EC (ASPIR-LOW) 81 mg, Oral, DAILY, Take with food.   ? atorvastatin (LIPITOR) 40 mg tablet Take 1 tablet by mouth once daily   ? azelastine (ASTELIN) 137 mcg (0.1 %) nasal spray 2 sprays, Each Nostril, TWICE DAILY, Use in each nostril as directed   ? benzonatate (TESSALON PERLES) 100 mg, Oral, EVERY  6 HOURS PRN   ? budesonide (PULMICORT) 0.25 mg/2 mL nebulizer solution Inhale 2 mL solution by nebulizer as directed twice daily. Dx: J44.9   ? bumetanide (BUMEX) 1 mg tablet Take with 2 mg tablet twice daily to make a total of 3 mg.   ? bumetanide (BUMEX) 2 mg tablet Take one tablet twice daily. Take along with 1 mg tablet for a total of 3 mg twice daily.   ? CHOLEcalciferoL (vitamin D3) 1,000 Units, Oral, DAILY   ? clopiDOGreL (PLAVIX) 75 mg, Oral, DAILY   ? diclofenac sodium (VOLTAREN) 4 g, Topical, FOUR TIMES DAILY   ? DOCOSAHEXAENOIC ACID-EPA PO Oral, DAILY   ? docusate (COLACE) 100 mg,  Oral, DAILY  PRN   ? ferrous gluconate 324 mg, Oral, EVERY 48 HOURS   ? finasteride (PROSCAR) 5 mg, Oral, DAILY   ? fish oil- omega 3-DHA/EPA 300/1,000 mg capsule 1 capsule, Oral, DAILY   ? flash glucose scanning reader (FREESTYLE LIBRE) reader Use as directed.   ? flash glucose sensor (FREESTYLE LIBRE 14 DAY SENSOR) sensor Type 2 diabetes   ? gabapentin (NEURONTIN) 400 mg, Oral, TWICE DAILY   ? guaiFENesin (ROBITUSSIN) 200 mg, Oral, EVERY  4 HOURS PRN   ? HYDROcodone/acetaminophen (NORCO) 5/325 mg tablet 1 tablet, Oral, EVERY  6 HOURS PRN   ? insulin pen needles (disposable) (BD ULTRA-FINE MINI PEN NEEDLE) 31 gauge x 3/16 pen needle Use with insulin pens   ? ipratropium bromide (ATROVENT) 21 mcg (0.03 %) nasal spray 2 sprays, Each Nostril, EVERY 12 HOURS   ? magnesium oxide 400 mg, Oral, TWICE DAILY   ? mepolizumab (NUCALA) 100 mg/mL injection pen Inject 100 mg under the skin every 28 days.   ? metOLazone (ZAROXOLYN) 2.5 mg, Oral, DAILY  PRN, PER OUTPATIENT CARDIOLOGY PROVIDER AS NEEDED FOR FLUID RETENTION   ? metoprolol succinate XL (TOPROL XL) 12.5 mg, Oral, DAILY   ? mexiletine (MEXITIL) 150 mg capsule Take 150 mg three times daily until follow up with EP   ? Miscellaneous Medical Supply misc Rx: Please wrap legs with ACE bandage from toes as high up to the leg as possible bilaterally<BR>Dx: Lymphedema   ? Miscellaneous Medical Supply misc itted for LE gradual compression stockings at a medical supply store   ? montelukast (SINGULAIR) 10 mg, Oral, AT BEDTIME DAILY   ? multivit-iron-FA-calcium-mins (THERA-M) 9 mg iron-400 mcg tablet 1 tablet, Oral, DAILY   ? nitroglycerin (NITROSTAT) 0.4 mg, Sublingual, EVERY  5 MIN PRN, Max of 3 tablets, call 911.    ? pantoprazole DR (PROTONIX) 40 mg, Oral, TWICE DAILY   ? polyethylene glycol 3350 (MIRALAX) 17 g, Oral, DAILY  PRN   ? potassium chloride (KLOR-CON M10) 10 mEq tablet 20 mEq, Oral, DAILY, Take with a meal and a full glass of water.   ? sodium chloride (HYPER-SAL) 3.5 % inhalation solution 4 mL, Inhalation, TWICE DAILY, Dx: J47.9   ? spironolactone (ALDACTONE) 12.5 mg, Oral, DAILY, Take with food.   ? tamsulosin (FLOMAX) 0.4 mg capsule TAKE 1 CAPSULE BY MOUTH ONCE DAILY (DO  NOT  CRUSH,  CHEW  OR  OPEN  CAPSULES  (TAKE  30  MINUTES  FOLLOWING  THE  SAME  MEAL  EACH  DAY).   ? thiamine mononitrate (vit B1) 100 mg, Oral, DAILY   ? traMADoL (ULTRAM) 50 mg, Oral, EVERY  6 HOURS PRN   ? TRULICITY 0.75 mg/0.5 mL injection pen INJECT 0.5 ML UNDER THE SKIN EVERY 7 DAYS FOR TYPE 2 DIABETES.   ? venlafaxine XR (EFFEXOR XR) 75 mg, Oral, DAILY, Take with food.        Scheduling Follow-up Appointment   Upcoming appointments:   Future Appointments   Date Time Provider Department Center   09/22/2021 10:00 AM MRI - BH ROOM 2 (1.5T) MR Radiology   09/22/2021 10:00 AM [place] DEVICE OUTPATIENT PROCEDURES [facility] CVM Procedur   09/29/2021 12:00 AM MAC REMOTE MONITORING [facility] CVM Procedur   10/06/2021  2:15 PM [name], Lenice Llamas, PA-C [facility]   10/13/2021  2:40 PM Joanie Coddington, MD [id number] SPINE   10/26/2021  9:00 AM PF LAB SCHEDULE C [id number] None   10/26/2021 10:20 AM  Deveron Furlong, MD MPAPULM IM   10/31/2021 12:00 AM MAC REMOTE MONITORING MACREMOTEHRM CVM Procedur   11/29/2021  1:30 PM Fredricka Bonine, APRN-NP CVMSNCL CVM Exam   12/01/2021 12:00 AM MAC REMOTE MONITORING MACREMOTEHRM CVM Procedur   01/02/2022 12:00 AM MAC REMOTE MONITORING MACREMOTEHRM CVM Procedur   01/16/2022 11:20 AM Comfort, [name], MD MPGENMED IM   01/24/2022  1:45 PM Donnelly Stager, MD MACKUCL CVM Exam   02/02/2022 12:00 AM MAC REMOTE MONITORING MACREMOTEHRM CVM Procedur   03/06/2022 12:00 AM MAC REMOTE MONITORING MACREMOTEHRM CVM Procedur   04/06/2022 12:00 AM MAC REMOTE MONITORING MACREMOTEHRM CVM Procedur   05/08/2022 12:00 AM MAC REMOTE MONITORING MACREMOTEHRM CVM Procedur   06/08/2022 12:00 AM MAC REMOTE MONITORING MACREMOTEHRM CVM Procedur     Does the patient require 7 day follow up appointment? Yes   Hospital Follow-Up scheduled with PCP? No, Call not made per protocol  When was patient?s last PCP visit: 05/11/2021   PCP primary location: [facility]  Specialist appointment scheduled? Yes, with Cardiology 8.03.23  Is assistance with transportation needed? No Unable to obtain  MyChart message sent? Active in MyChart. No message sent.   Dwana Curd text sent? n/a    Royal Piedra, RN

## 2021-09-14 ENCOUNTER — Encounter: Admit: 2021-09-14 | Discharge: 2021-09-14 | Payer: MEDICARE

## 2021-09-19 ENCOUNTER — Encounter: Admit: 2021-09-19 | Discharge: 2021-09-19 | Payer: MEDICARE

## 2021-09-19 NOTE — Telephone Encounter
Advised appointment in prior my chart message sent to further evaluate concerns and for treatment recommendations.    Etta Grandchild, RN

## 2021-09-20 ENCOUNTER — Encounter: Admit: 2021-09-20 | Discharge: 2021-09-20 | Payer: MEDICARE

## 2021-09-20 MED FILL — ALBUTEROL SULFATE 2.5 MG /3 ML (0.083 %) IN NEBU: 2.5 mg /3 mL (0.083 %) | 19 days supply | Qty: 225 | Fill #5 | Status: AC

## 2021-09-20 MED FILL — BUDESONIDE 0.25 MG/2 ML IN NBSP: 0.25 mg/2 mL | 15 days supply | Qty: 60 | Fill #6 | Status: AC

## 2021-09-20 MED FILL — SODIUM CHLORIDE 3.5 % IN NEBU: 3.5 % | RESPIRATORY_TRACT | 30 days supply | Qty: 240 | Fill #5 | Status: AC

## 2021-09-21 ENCOUNTER — Encounter: Admit: 2021-09-21 | Discharge: 2021-09-21 | Payer: MEDICARE

## 2021-09-22 ENCOUNTER — Encounter: Admit: 2021-09-22 | Discharge: 2021-09-22 | Payer: MEDICARE

## 2021-09-22 ENCOUNTER — Ambulatory Visit: Admit: 2021-09-22 | Discharge: 2021-09-22 | Payer: MEDICARE

## 2021-09-22 DIAGNOSIS — Z95810 Presence of automatic (implantable) cardiac defibrillator: Secondary | ICD-10-CM

## 2021-09-22 DIAGNOSIS — J4541 Moderate persistent asthma with (acute) exacerbation: Secondary | ICD-10-CM

## 2021-09-22 DIAGNOSIS — R52 Pain, unspecified: Secondary | ICD-10-CM

## 2021-09-22 MED ORDER — GADOBENATE DIMEGLUMINE 529 MG/ML (0.1MMOL/0.2ML) IV SOLN
20 mL | Freq: Once | INTRAVENOUS | 0 refills | Status: CP
Start: 2021-09-22 — End: ?
  Administered 2021-09-22: 17:00:00 20 mL via INTRAVENOUS

## 2021-09-22 MED ORDER — MEPOLIZUMAB 100 MG SC SOLR
100 mg | Freq: Once | SUBCUTANEOUS | 0 refills | Status: CP
Start: 2021-09-22 — End: ?
  Administered 2021-09-22: 19:00:00 100 mg via SUBCUTANEOUS

## 2021-09-22 MED FILL — NUCALA 100 MG/ML SC ATIN: 100 mg/mL | SUBCUTANEOUS | 28 days supply | Qty: 1 | Fill #2 | Status: CP

## 2021-09-22 NOTE — Progress Notes
Patient elected to not stay for waiting period.     Jacques Navy, RN

## 2021-09-22 NOTE — Progress Notes
[facility]  MRI Recommendations for [facility] Card[facility] Device Patient Undergoing Conditional MRI       Patient: Ruben Wood. MRN: [id number]   Date of Birth: 03/23/1940 Anticipated Date of MRI: 09/22/21     MRI SYSTEM CONDITIONS    Patient has a complete MRI ready system with MR Conditional generator and lead(s) Leads implanted > 6 weeks prior to MRI:                         [x]   Yes    []  No      Device FDA approved to undergo MRI Scan at:  [x]  1.5T    []  3T     DEVICE IMAGING   If patient's device is not followed by [facility], images have been obtained at a [place] facility and reviewed within the past six months (must have an image obtained since the last device revision).   Chest X-ray/ CT has been reviewed since implantation (or revision of the leads) of the device/ lead(s):  [x]   Yes    []  No      Generator is implanted in the pectoral region:  [x]   Yes    []  No      The system is absent of lead extenders, lead adapters, broken leads, and abandoned leads: [x]   Yes    []  No      DEVICE EVALUATION   If patient's device is not followed by [place] [facility], a device check has been obtained at a [place] facility and reviewed within the past six months.                                                                              []   Patient meets eligibility for SmartSync SureScan.  This section does NOT need completed; the application will collect relevant device data at the time of the MRI.   Generator has sufficient battery (generator is not at end-of-life/ ERI/ EOS/ RRT):  [x]   Yes    []  No      Patient is historically NOT dependent (as defined by no escape greater than 40 bpm):  [x]   Correct   []  Incorrect   Lead impedance measurements are within the programmed lead impedance limits (For atrial and ventricular pacing leads: lead impedance 200-2,000 ohms.  For defibrillation leads: lead impedance 20-200 ohms): [x]   Yes    []  No      Patient tolerates a pacing output of 5.0 V at a pulse width of 1.0 ms: []   Yes [x]  Assess at time of MRI   Pacing Percentages:   [x]  Atrial Pacing:  60%               [x]  Ventricular Pacing:  99%                  Capture threshold < 3.5 V at a pulse width of 0.5 ms for RA and RV leads for devices that will be programmed to an asynchronous pacing mode []   Yes    []  No      Preliminary recommendations for PRE-MRI SCAN DEVICE SETTINGS*  (Full assessment of programming to be  confirmed by device staff or representative at time of MRI)     Pacing rate selected to avoid competitive pacing (recommendations based on historical data). The programmed MRI mode will turn off tachy detections for MRI.     [x]  Pacing Mode: DOO (bipolar) Pacing Rate 80 bpm        []   Pacing Off    []  Utilize Medtronic SmartSync application to place patient in SureScan mode**           Programming Recommendations Made in Collaboration with: Levonne Hubert, APRN       Assessment completed by: Jerl Mina, RN    *The [facility] Device Team has completed this assessment based on current available data in the patient's medical record.  If changes/ concerns are noted at the time of the MRI, the [facility] Device Staff/ Vendor Representative have the discretion to use best clinical judgement to ensure the safety of the patient.      **Medtronic SmartSync application has safety parameters in place.  If progression to positive SureScan meets lockout criteria, please contact the [facility] Device Team or [employer] representative for further assistance.  Please send report of final programming parameters sent to [facility] [place] device team (Email: [email or web address] Fax: [phone])      [facility]    [email or web address]  [facility]     [email or web address]    [employer]     [phone]  [email or web address]

## 2021-09-22 NOTE — Progress Notes
Procedure: MRI Right lower extremity with and without contrast    Pacemaker Type: Conditional    Order verified: Yes    Allergies reviewed: Yes    Patient history reviewed: Yes    Conditional Pacemaker: Minimum requirements, vital signs needed pre/post scan (See Doc Flowsheets for further).    Pt placed on MRI safe cardiac, BP and O2 monitors.   ?   8832 Pre procedure Vital Signs (See Doc Flowsheets for further).    1112 MRI device settings placed by Delphina Cahill., RN.    Program Mode: DOO    Pacing Mode: On    If pacing mode on Pacing Rate set at: 90     1251 Post scan device returned back to prior MRI device settings by Delphina Cahill., RN.    Post procedure Vital Signs (See Doc Flowsheets for further).

## 2021-09-22 NOTE — Progress Notes
Patient given 100 mg/mL of medication to (R) arm. No reactions noted, patient tolerated well. Patient did not  wait 30 minutes  in waiting room post injection per protocol. DT, CMA

## 2021-09-23 ENCOUNTER — Encounter: Admit: 2021-09-23 | Discharge: 2021-09-23 | Payer: MEDICARE

## 2021-09-26 ENCOUNTER — Encounter: Admit: 2021-09-26 | Discharge: 2021-09-26 | Payer: MEDICARE

## 2021-09-26 ENCOUNTER — Inpatient Hospital Stay: Admit: 2021-09-26 | Discharge: 2021-09-26 | Payer: MEDICARE

## 2021-09-26 ENCOUNTER — Inpatient Hospital Stay: Admit: 2021-09-26 | Payer: MEDICARE

## 2021-09-26 ENCOUNTER — Ambulatory Visit: Admit: 2021-09-26 | Discharge: 2021-09-26 | Payer: MEDICARE

## 2021-09-26 DIAGNOSIS — J449 Chronic obstructive pulmonary disease, unspecified: Secondary | ICD-10-CM

## 2021-09-26 DIAGNOSIS — L0390 Cellulitis, unspecified: Secondary | ICD-10-CM

## 2021-09-26 DIAGNOSIS — L03115 Cellulitis of right lower limb: Secondary | ICD-10-CM

## 2021-09-26 DIAGNOSIS — I10 Essential (primary) hypertension: Secondary | ICD-10-CM

## 2021-09-26 DIAGNOSIS — Z9981 Dependence on supplemental oxygen: Secondary | ICD-10-CM

## 2021-09-26 DIAGNOSIS — Z95828 Presence of other vascular implants and grafts: Secondary | ICD-10-CM

## 2021-09-26 DIAGNOSIS — G4733 Obstructive sleep apnea (adult) (pediatric): Secondary | ICD-10-CM

## 2021-09-26 DIAGNOSIS — I429 Cardiomyopathy, unspecified: Secondary | ICD-10-CM

## 2021-09-26 DIAGNOSIS — I509 Heart failure, unspecified: Secondary | ICD-10-CM

## 2021-09-26 DIAGNOSIS — R609 Edema, unspecified: Secondary | ICD-10-CM

## 2021-09-26 DIAGNOSIS — R0600 Dyspnea, unspecified: Secondary | ICD-10-CM

## 2021-09-26 DIAGNOSIS — I5042 Chronic combined systolic (congestive) and diastolic (congestive) heart failure: Secondary | ICD-10-CM

## 2021-09-26 DIAGNOSIS — L89150 Pressure ulcer of sacral region, unstageable: Secondary | ICD-10-CM

## 2021-09-26 DIAGNOSIS — E782 Mixed hyperlipidemia: Secondary | ICD-10-CM

## 2021-09-26 DIAGNOSIS — J302 Other seasonal allergic rhinitis: Secondary | ICD-10-CM

## 2021-09-26 DIAGNOSIS — N3000 Acute cystitis without hematuria: Secondary | ICD-10-CM

## 2021-09-26 DIAGNOSIS — M86351 Chronic multifocal osteomyelitis, right femur: Secondary | ICD-10-CM

## 2021-09-26 DIAGNOSIS — I714 AAA (abdominal aortic aneurysm) (HCC): Secondary | ICD-10-CM

## 2021-09-26 DIAGNOSIS — I2510 Atherosclerotic heart disease of native coronary artery without angina pectoris: Secondary | ICD-10-CM

## 2021-09-26 DIAGNOSIS — R053 Chronic cough: Secondary | ICD-10-CM

## 2021-09-26 DIAGNOSIS — K219 Gastro-esophageal reflux disease without esophagitis: Secondary | ICD-10-CM

## 2021-09-26 DIAGNOSIS — J189 Pneumonia, unspecified organism: Secondary | ICD-10-CM

## 2021-09-26 DIAGNOSIS — N401 Enlarged prostate with lower urinary tract symptoms: Secondary | ICD-10-CM

## 2021-09-26 DIAGNOSIS — M961 Postlaminectomy syndrome, not elsewhere classified: Secondary | ICD-10-CM

## 2021-09-26 DIAGNOSIS — M86051 Acute hematogenous osteomyelitis, right femur: Secondary | ICD-10-CM

## 2021-09-26 DIAGNOSIS — N183 CKD (chronic kidney disease) stage 3, GFR 30-59 ml/min (HCC): Secondary | ICD-10-CM

## 2021-09-26 DIAGNOSIS — Z8679 Personal history of other diseases of the circulatory system: Secondary | ICD-10-CM

## 2021-09-26 DIAGNOSIS — L03116 Cellulitis of left lower limb: Secondary | ICD-10-CM

## 2021-09-26 DIAGNOSIS — U071 Pneumonia due to COVID-19 virus: Secondary | ICD-10-CM

## 2021-09-26 DIAGNOSIS — E119 Type 2 diabetes mellitus without complications: Secondary | ICD-10-CM

## 2021-09-26 DIAGNOSIS — Z8614 Personal history of Methicillin resistant Staphylococcus aureus infection: Secondary | ICD-10-CM

## 2021-09-26 DIAGNOSIS — D509 Iron deficiency anemia, unspecified: Secondary | ICD-10-CM

## 2021-09-26 DIAGNOSIS — J479 Bronchiectasis, uncomplicated: Secondary | ICD-10-CM

## 2021-09-26 DIAGNOSIS — I4891 Unspecified atrial fibrillation: Secondary | ICD-10-CM

## 2021-09-26 DIAGNOSIS — M954 Acquired deformity of chest and rib: Secondary | ICD-10-CM

## 2021-09-26 DIAGNOSIS — J45909 Unspecified asthma, uncomplicated: Secondary | ICD-10-CM

## 2021-09-26 LAB — CBC AND DIFF
ABSOLUTE BASO COUNT: 0 K/UL (ref 0–0.20)
ABSOLUTE EOS COUNT: 0 K/UL (ref 0–0.45)
ABSOLUTE LYMPH COUNT: 0.6 K/UL — ABNORMAL LOW (ref 1.0–4.8)
ABSOLUTE MONO COUNT: 0.8 K/UL — ABNORMAL HIGH (ref 0–0.80)
ABSOLUTE NEUTROPHIL: 7.5 K/UL — ABNORMAL HIGH (ref 1.8–7.0)
BASOPHILS %: 0 % (ref 0–2)
EOSINOPHILS %: 1 % (ref 0–5)
HEMATOCRIT: 28 % — ABNORMAL LOW (ref 40–50)
HEMATOCRIT: 26 % — ABNORMAL LOW (ref 40–50)
HEMOGLOBIN: 9.3 g/dL — ABNORMAL LOW (ref 13.5–16.5)
HEMOGLOBIN: 9 g/dL — ABNORMAL LOW (ref 13.5–16.5)
LYMPHOCYTES %: 7 % — ABNORMAL LOW (ref 24–44)
MCH: 33 pg (ref 26–34)
MCH: 34 pg (ref 26–34)
MCHC: 32 g/dL (ref 32.0–36.0)
MCHC: 33 g/dL (ref 32.0–36.0)
MCV: 103 FL — ABNORMAL HIGH (ref 80–100)
MCV: 101 FL — ABNORMAL HIGH (ref 80–100)
MONOCYTES %: 10 % (ref 4–12)
MPV: 7.5 FL (ref 7–11)
NEUTROPHILS %: 82 % — ABNORMAL HIGH (ref 41–77)
PLATELET COUNT: 204 K/UL (ref 150–400)
RBC COUNT: 2.7 M/UL — ABNORMAL LOW (ref 4.4–5.5)
RBC COUNT: 2.6 M/UL — ABNORMAL LOW (ref 4.4–5.5)
RDW: 17 % — ABNORMAL HIGH (ref 11–15)
RDW: 17 % — ABNORMAL HIGH (ref 11–15)
WBC COUNT: 7.6 K/UL (ref 4.5–11.0)
WBC COUNT: 9.1 K/UL (ref 4.5–11.0)

## 2021-09-26 LAB — LACTIC ACID(LACTATE): LACTIC ACID: 1.2 MMOL/L (ref 0.5–2.0)

## 2021-09-26 LAB — MAGNESIUM: MAGNESIUM: 1.5 mg/dL — ABNORMAL LOW (ref 1.6–2.6)

## 2021-09-26 LAB — PHOSPHORUS: PHOSPHORUS: 2.3 mg/dL (ref 2.0–4.5)

## 2021-09-26 LAB — COMPREHENSIVE METABOLIC PANEL
ALBUMIN: 3.5 g/dL (ref 3.5–5.0)
ALBUMIN: 3.3 g/dL — ABNORMAL LOW (ref 3.5–5.0)
ALK PHOSPHATASE: 83 U/L (ref 25–110)
ALK PHOSPHATASE: 76 U/L (ref 25–110)
ALT: 22 U/L (ref 7–56)
ANION GAP: 8 (ref 3–12)
ANION GAP: 8 (ref 3–12)
AST: 21 U/L (ref 7–40)
AST: 19 U/L (ref 7–40)
BLD UREA NITROGEN: 50 mg/dL — ABNORMAL HIGH (ref 7–25)
BLD UREA NITROGEN: 47 mg/dL — ABNORMAL HIGH (ref 7–25)
CALCIUM: 9.3 mg/dL (ref 8.5–10.6)
CHLORIDE: 93 MMOL/L — ABNORMAL LOW (ref 98–110)
CHLORIDE: 95 MMOL/L — ABNORMAL LOW (ref 98–110)
CO2: 30 MMOL/L (ref 21–30)
CO2: 30 MMOL/L (ref 21–30)
CREATININE: 1.3 mg/dL — ABNORMAL HIGH (ref 0.4–1.24)
EGFR: 54 mL/min — ABNORMAL LOW (ref >60)
EGFR: 53 mL/min — ABNORMAL LOW (ref >60)
GLUCOSE,PANEL: 73 mg/dL — ABNORMAL LOW (ref 70–100)
GLUCOSE,PANEL: 81 mg/dL (ref 70–100)
POTASSIUM: 4.2 MMOL/L (ref 3.5–5.1)
SODIUM: 131 MMOL/L — ABNORMAL LOW (ref 137–147)
SODIUM: 133 MMOL/L — ABNORMAL LOW (ref 137–147)
TOTAL BILIRUBIN: 0.6 mg/dL — ABNORMAL LOW (ref 0.3–1.2)
TOTAL BILIRUBIN: 0.6 mg/dL (ref 0.3–1.2)
TOTAL PROTEIN: 6.7 g/dL (ref 6.0–8.0)
TOTAL PROTEIN: 6.3 g/dL (ref 6.0–8.0)

## 2021-09-26 LAB — URINALYSIS DIPSTICK REFLEX TO CULTURE
NITRITE: NEGATIVE
URINE ASCORBIC ACID, UA: NEGATIVE
URINE BILE: NEGATIVE
URINE BLOOD: NEGATIVE
URINE KETONE: NEGATIVE

## 2021-09-26 LAB — C REACTIVE PROTEIN (CRP): C-REACTIVE PROTEIN: 6.4 mg/dL — ABNORMAL HIGH (ref <1.0)

## 2021-09-26 LAB — URINALYSIS MICROSCOPIC REFLEX TO CULTURE

## 2021-09-26 MED ORDER — MULTIVIT-IRON-FA-CALCIUM-MINS 9 MG IRON-400 MCG PO TAB
1 | Freq: Every day | ORAL | 0 refills | Status: AC
Start: 2021-09-26 — End: ?
  Administered 2021-09-27: 13:00:00 1 via ORAL

## 2021-09-26 MED ORDER — SENNOSIDES-DOCUSATE SODIUM 8.6-50 MG PO TAB
1 | Freq: Every day | ORAL | 0 refills | Status: AC | PRN
Start: 2021-09-26 — End: ?
  Administered 2021-09-28: 13:00:00 1 via ORAL

## 2021-09-26 MED ORDER — ATORVASTATIN 40 MG PO TAB
40 mg | Freq: Every day | ORAL | 0 refills | Status: AC
Start: 2021-09-26 — End: ?
  Administered 2021-09-27 – 2021-09-28 (×2): 40 mg via ORAL

## 2021-09-26 MED ORDER — IPRATROPIUM BROMIDE 42 MCG (0.06 %) NA SPRY
2 | Freq: Two times a day (BID) | NASAL | 0 refills | Status: AC
Start: 2021-09-26 — End: ?
  Administered 2021-09-27: 16:00:00 2 via NASAL

## 2021-09-26 MED ORDER — ACETAMINOPHEN 325 MG PO TAB
650 mg | ORAL | 0 refills | Status: AC | PRN
Start: 2021-09-26 — End: ?

## 2021-09-26 MED ORDER — SODIUM CHLORIDE 3 % IN NEBU
4 mL | Freq: Two times a day (BID) | RESPIRATORY_TRACT | 0 refills | Status: AC
Start: 2021-09-26 — End: ?
  Administered 2021-09-27 – 2021-09-28 (×3): 4 mL via RESPIRATORY_TRACT

## 2021-09-26 MED ORDER — ARFORMOTEROL 15 MCG/2 ML IN NEBU
15 ug | Freq: Two times a day (BID) | RESPIRATORY_TRACT | 0 refills | Status: AC
Start: 2021-09-26 — End: ?
  Administered 2021-09-27 – 2021-09-28 (×3): 15 ug via RESPIRATORY_TRACT

## 2021-09-26 MED ORDER — MAGNESIUM OXIDE 400 MG (241.3 MG MAGNESIUM) PO TAB
400 mg | Freq: Two times a day (BID) | ORAL | 0 refills | Status: AC
Start: 2021-09-26 — End: ?
  Administered 2021-09-27 – 2021-09-28 (×4): 400 mg via ORAL

## 2021-09-26 MED ORDER — ENOXAPARIN 40 MG/0.4 ML SC SYRG
40 mg | Freq: Every day | SUBCUTANEOUS | 0 refills | Status: AC
Start: 2021-09-26 — End: ?
  Administered 2021-09-27: 01:00:00 40 mg via SUBCUTANEOUS

## 2021-09-26 MED ORDER — OXYCODONE 5 MG PO TAB
5 mg | ORAL | 0 refills | Status: AC | PRN
Start: 2021-09-26 — End: ?

## 2021-09-26 MED ORDER — CLOPIDOGREL 75 MG PO TAB
75 mg | Freq: Every day | ORAL | 0 refills | Status: AC
Start: 2021-09-26 — End: ?
  Administered 2021-09-27 – 2021-09-28 (×2): 75 mg via ORAL

## 2021-09-26 MED ORDER — BUMETANIDE 1 MG PO TAB
3 mg | Freq: Two times a day (BID) | ORAL | 0 refills | Status: AC
Start: 2021-09-26 — End: ?
  Administered 2021-09-26: 22:00:00 3 mg via ORAL

## 2021-09-26 MED ORDER — FINASTERIDE 5 MG PO TAB
5 mg | Freq: Every day | ORAL | 0 refills | Status: AC
Start: 2021-09-26 — End: ?
  Administered 2021-09-27 – 2021-09-28 (×2): 5 mg via ORAL

## 2021-09-26 MED ORDER — VENLAFAXINE 75 MG PO CP24
75 mg | Freq: Every day | ORAL | 0 refills | Status: AC
Start: 2021-09-26 — End: ?
  Administered 2021-09-27 – 2021-09-28 (×2): 75 mg via ORAL

## 2021-09-26 MED ORDER — FERROUS GLUCONATE 324 MG (37.5 MG IRON) PO TAB
324 mg | ORAL | 0 refills | Status: AC
Start: 2021-09-26 — End: ?
  Administered 2021-09-27: 06:00:00 324 mg via ORAL

## 2021-09-26 MED ORDER — AMIODARONE 200 MG PO TAB
400 mg | Freq: Two times a day (BID) | ORAL | 0 refills | Status: AC
Start: 2021-09-26 — End: ?
  Administered 2021-09-27 – 2021-09-28 (×4): 400 mg via ORAL

## 2021-09-26 MED ORDER — BENZONATATE 100 MG PO CAP
100 mg | ORAL | 0 refills | Status: AC | PRN
Start: 2021-09-26 — End: ?

## 2021-09-26 MED ORDER — ONDANSETRON HCL (PF) 4 MG/2 ML IJ SOLN
4 mg | INTRAVENOUS | 0 refills | Status: AC | PRN
Start: 2021-09-26 — End: ?

## 2021-09-26 MED ORDER — ALLOPURINOL 300 MG PO TAB
300 mg | Freq: Every day | ORAL | 0 refills | Status: AC
Start: 2021-09-26 — End: ?
  Administered 2021-09-27 – 2021-09-28 (×2): 300 mg via ORAL

## 2021-09-26 MED ORDER — PANTOPRAZOLE 40 MG PO TBEC
40 mg | Freq: Two times a day (BID) | ORAL | 0 refills | Status: AC
Start: 2021-09-26 — End: ?
  Administered 2021-09-27 – 2021-09-28 (×4): 40 mg via ORAL

## 2021-09-26 MED ORDER — SPIRONOLACTONE 25 MG PO TAB
12.5 mg | Freq: Every day | ORAL | 0 refills | Status: AC
Start: 2021-09-26 — End: ?
  Administered 2021-09-27: 13:00:00 12.5 mg via ORAL

## 2021-09-26 MED ORDER — BUDESONIDE 0.25 MG/2 ML IN NBSP
.25 mg | Freq: Two times a day (BID) | RESPIRATORY_TRACT | 0 refills | Status: AC
Start: 2021-09-26 — End: ?
  Administered 2021-09-27 – 2021-09-28 (×4): 0.25 mg via RESPIRATORY_TRACT

## 2021-09-26 MED ORDER — METOPROLOL SUCCINATE 25 MG PO TB24
12.5 mg | Freq: Every day | ORAL | 0 refills | Status: AC
Start: 2021-09-26 — End: ?
  Administered 2021-09-27 – 2021-09-28 (×2): 12.5 mg via ORAL

## 2021-09-26 MED ORDER — ASPIRIN 81 MG PO TBEC
81 mg | Freq: Every day | ORAL | 0 refills | Status: AC
Start: 2021-09-26 — End: ?
  Administered 2021-09-27 – 2021-09-28 (×2): 81 mg via ORAL

## 2021-09-26 MED ORDER — MONTELUKAST 10 MG PO TAB
10 mg | Freq: Every evening | ORAL | 0 refills | Status: AC
Start: 2021-09-26 — End: ?
  Administered 2021-09-27 – 2021-09-28 (×2): 10 mg via ORAL

## 2021-09-26 MED ORDER — MEXILETINE 150 MG PO CAP
150 mg | Freq: Three times a day (TID) | ORAL | 0 refills | Status: AC
Start: 2021-09-26 — End: ?
  Administered 2021-09-27 – 2021-09-28 (×6): 150 mg via ORAL

## 2021-09-26 MED ORDER — ONDANSETRON 4 MG PO TBDI
4 mg | ORAL | 0 refills | Status: AC | PRN
Start: 2021-09-26 — End: ?

## 2021-09-26 MED ORDER — MELATONIN 5 MG PO TAB
5 mg | Freq: Every evening | ORAL | 0 refills | Status: AC | PRN
Start: 2021-09-26 — End: ?
  Administered 2021-09-27: 01:00:00 5 mg via ORAL

## 2021-09-26 MED ORDER — CHOLECALCIFEROL (VITAMIN D3) 25 MCG (1,000 UNIT) PO TAB
1000 [IU] | Freq: Every day | ORAL | 0 refills | Status: AC
Start: 2021-09-26 — End: ?
  Administered 2021-09-27 – 2021-09-28 (×2): 1000 [IU] via ORAL

## 2021-09-26 MED ORDER — ALBUTEROL SULFATE 2.5 MG /3 ML (0.083 %) IN NEBU
2.5 mg | RESPIRATORY_TRACT | 0 refills | Status: AC | PRN
Start: 2021-09-26 — End: ?

## 2021-09-26 MED ORDER — HYDROCODONE-ACETAMINOPHEN 5-325 MG PO TAB
1 | ORAL | 0 refills | Status: DC | PRN
Start: 2021-09-26 — End: 2021-09-26

## 2021-09-26 MED ORDER — GABAPENTIN 400 MG PO CAP
400 mg | Freq: Two times a day (BID) | ORAL | 0 refills | Status: AC
Start: 2021-09-26 — End: ?
  Administered 2021-09-27 – 2021-09-28 (×4): 400 mg via ORAL

## 2021-09-26 MED ORDER — POLYETHYLENE GLYCOL 3350 17 GRAM PO PWPK
1 | Freq: Every day | ORAL | 0 refills | Status: AC | PRN
Start: 2021-09-26 — End: ?
  Administered 2021-09-28: 13:00:00 17 g via ORAL

## 2021-09-26 MED ORDER — TAMSULOSIN 0.4 MG PO CAP
.4 mg | Freq: Every day | ORAL | 0 refills | Status: AC
Start: 2021-09-26 — End: ?
  Administered 2021-09-27 – 2021-09-28 (×2): 0.4 mg via ORAL

## 2021-09-26 MED ORDER — THIAMINE MONONITRATE (VIT B1) 100 MG PO TAB
100 mg | Freq: Every day | ORAL | 0 refills | Status: AC
Start: 2021-09-26 — End: ?
  Administered 2021-09-27 – 2021-09-28 (×2): 100 mg via ORAL

## 2021-09-26 MED FILL — SODIUM CHLORIDE 3.5 % IN NEBU: 3.5 % | RESPIRATORY_TRACT | 30 days supply | Status: CN

## 2021-09-26 MED FILL — ARFORMOTEROL 15 MCG/2 ML IN NEBU: 15 mcg/2 mL | 30 days supply | Qty: 120 | Fill #5 | Status: AC

## 2021-09-26 NOTE — Progress Notes
RT Adult Assessment Note    NAME:Ruben Wood.             MRN: [id number]             DOB:[birth year]          AGE: 81 y.o.  ADMISSION DATE: 09/26/2021             DAYS ADMITTED: LOS: 0 days    RT Treatment Plan:  Protocol Plan: Medications  Albuterol: Nebulizer PRN  Brovana: BID  Pulmicort (Home regimen only): BID    Protocol Plan: Procedures  PAP: Place a nursing order for "IS Q1h While Awake" for any of Lung Expansion indicators  Oxygen/Humidity: O2 to keep SpO2 > 92%, if on room air for > 24 hours and no other RT modalities are required, then D/C protocol  CPAP/BiPAP: CPAP  SpO2: BID & PRN;Continuous (Document SpO2 result Qshift)  Comment: Baseline 4L O2, home cpap    Additional Comments:  Impressions of the patient: NAD  Intervention(s)/outcome(s): See RT treatment plan    Vital Signs:  Pulse: 78  RR: 16 PER MINUTE  SpO2: 97 %  O2 Device: Nasal cannula  Liter Flow: 4 Lpm  O2%:    Breath Sounds:    Respiratory Effort: Unlabored

## 2021-09-26 NOTE — H&P (View-Only)
Admission History and Physical    Ruben Wood.  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    Stage 3 chronic kidney disease (HCC)    Mixed dyslipidemia    ICD (implantable cardioverter-defibrillator), biventricular, in situ    Ischemic cardiomyopathy    Hypogammaglobulinemia (HCC)    Benign prostatic hyperplasia (BPH)    Urinary incontinence, urge    Chronic respiratory failure with hypercapnia (HCC)    Type 2 diabetes mellitus with hyperglycemia, without long-term current use of insulin (HCC)    Atrial fibrillation (HCC)    Chronic combined systolic (congestive) and diastolic (congestive) heart failure (HCC)    Chronic bronchitis (HCC)    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 is a 81 y.o. male with a PMH significant for CHF with EF of 25 to 30%, CAD, diabetes, CKD stage III, BPH, COPD, chronic respiratory failure on 4 L O2 via nasal cannula, hypertension, GERD, and osteomyelitis of the femur.    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 with surrounding hemorrhage/infected debris and diffuse periostitis, likely reactive  - Pt hemodynamically stable with no evidence of sepsis  - Blood cx drawn in clinic on 8/7  - Patient does have PICC inserted from previous antibiotic administration  Plan:  > ID consult, appreciate recs  > Ortho surg consult, appreciate recs  > Hold off on abx until ortho recs  > CXR pending to eval PICC placement  > Sed rate pending  > CRP pending  > Pain regimen: tylenol 650 q6 PRN, oxycodone 5mg  q6 PRN  > Hold PTA tramadol      Urinary urgency  BPH  - Hx of frequent UTIs  - Pt notes worsening foul odor of urine and urgency with retention in last 1-2 days  - denies dysuria, burning  - Ddx includes UTI versus BPH  Plan:  > Bladder scan pending  > UA pending  > Continue PTA finasteride 5 mg, Flomax 0.4 mg    Sacral Wound  - Present during admission to [facility]  - No significant drainage per pt  - Using cream given by wound care at [facility]  Plan:  > wound care consulted        Chronic Conditions  CHF with last known EF 25-30%: Recent history of exacerbation in July, continue PTA Bumex 3 mg twice daily, magnesium 400 twice daily, mexiletine 150 3 times daily, spironolactone 12.5  CAD: With PCI 4/23 with DES to left main, continue PTA Plavix 75, metoprolol succinate 12.5 mg, aspirin 81 mg  Diabetes: Hold PTA Trulicity, continue venlafaxine 75, continue gabapentin 400 twice daily, re-eval in AM for need of  correction  Hypertension: Continue PTA amiodarone 400 twice daily,  Hyperlipidemia: Continue PTA atorvastatin 40  COPD: Recent history of admission for PNA in July, continue PTA albuterol nebs, Pulmicort nebulizer, Atrovent, montelukast 10 nightly, NaCl 3% twice daily nebulizer  Gout: continue PTA allopurinol  GERD: Continue PTA PPI 40 twice daily  Iron deficiency anemia: Continue PTA iron, thiamine 100, continue D3 1000        FEN: No IVF, Diet Low Sodium diet   VTE Ppx: SCD, lovenox ppx  Code status: Full Code    Disposition: Admit to Med 1    Pt was seen and discussed with Dr. Charlie Pitter, DO  PGY-1 Internal Medicine  Pager [phone] - On Voalte    ____________________________________________________________    CC: Cellulitis of RLL    HPI:   Ruben Wood. is a 81 y.o. male with a PMH of CHF with EF of 25 to 30%, COPD, hypertension, hyperlipidemia, chronic respiratory failure on 4 L O2 via nasal cannula, hypergammaglobulinemia, severe asthma with bronchiectasis, peripheral vascular disease, and osteomyelitis of the femur.  Presents presents as a direct admission from orthopedic clinic due to concern of worsening cellulitis and concern for sepsis secondary to osteomyelitis of the femur.  Patient initially noticed pain in the right knee and femur area in the beginning of July.  At that time x-ray of the right knee revealed evidence of chronic osteomyelitis of unknown origin and patient was started on IV vancomycin on 7/12 and and per documentation was supposed to be on it through 7/26.  After patient patient admission to [facility] for pneumonia patient was admitted to rehab.  During this time he reports that his lower extremity pain, erythema, and swelling improved with the vancomycin.    He reports that on 8/3 he had a fall that led to several open wounds to his right lower extremity has been progressively becoming more erythematous, painful, and swollen and he now reports pain that is worse with walking. MRI performed on 09/22/2021 showed findings consistent with chronic infection of femoral diaphysis.  Patient saw Dr. Rosette Reveal this morning and findings were concerning given the pts increased risk of sepsis and given his significant comorbidities he would be a better candidate to manage inpatient with ID team and Ortho recommendations.    Patient does report that he had some chills this morning but no documented fever.  He reports that his shortness of breath is at baseline and he is currently on 4 L via nasal cannula.  Orthopnea is at baseline.  Patient denies chest pain, cough, nausea/vomiting.  Patient does report blood on toilet paper when wiping after bowel movements.  Patient does have a known sacral wound.    Patient also states that his urine has a foul odor that is consistent with the previous urinary tract infections that he has had.  He does report some urgency but no ability to urinate.  He denies frequency or dysuria.    Patient has a painful sacral ulcer that has had slight improvement with a cream given to him by the wound specialist at [facility].  Patient denies any significant drainage from wound.    On admission pt was hemodynamically stable, A&Ox4, no fever.      PMH:  Medical History:   Diagnosis Date   ? AAA (abdominal aortic aneurysm) (HCC) 10/15/2012   ? Asthma    ? Atrial fibrillation (HCC)    ? BPH with obstruction/lower urinary tract  symptoms    ? Bronchiectasis (HCC)    ? CAD (coronary artery disease), native coronary artery 08/15/2012    07/11/2002- Cath @ [facility] showed Severe double vessel disease(OMB of circumflex and distal circ)  inferior-basilar dyskinesis.                 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  09/12/12   Cath    ? Cardiomyopathy (HCC) 07/19/2014   ? Cellulitis of left lower extremity 11/12/2014    10/2014: admitted with cellulitis, [place] negative for venous clot, MRI without osteomyelitis.    11/12/2014 In clinic today, still with cellulitis.  Per patient and his wife, the erythema may be extending.  Cultures from drainage in hospital with MSSA, but Blood Cx negative.  No e/o osteomyelitis.  My concern is that this antibiotic regimen is not adequately treating his cellulitis.  We will broaden coverage to get MRSA with doxycycline.  Patient and wife given strict call/return criteria.  I will see patient in 3-4 days for re-evaluation.  No fever today.  Plan:  Stop cefpodoxime and start doxycylcine  11/17/2014 Much better, no warmth and erythema minimal.   Plan: continue doxycyline for full 10 day course.      ? Chest wall deformity 07/09/2012    Chest wall reconstruction on 07/09/12    ? CHF (congestive heart failure) (HCC)    ? 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 Chronic cough 08/29/2012   ? 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 COPD (chronic obstructive pulmonary disease) (HCC) 08/13/2012   ? Diabetes (HCC)    ? Dyspnea    ? Edema    ? GERD (gastroesophageal reflux disease) 08/13/2012   ? History of CHF (congestive heart failure) 08/13/2012   ? History of MRSA infection 10/14/2012   ? 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    ? Hypertension 08/13/2012   ? IDA (iron deficiency anemia)    ? 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 Morbid obesity (HCC) 08/13/2012   ? On supplemental oxygen therapy    ? OSA on CPAP 08/13/2012   ? Pneumonia 04/2012    [facility]   ? Pneumonia due to COVID-19 virus 12/05/2018   ? Seasonal allergic reaction      Surgical History:   Procedure Laterality Date   ? LUNG SURGERY  2015    Bio bridge in left lung/rib cage was broken  from coughing   ? BACK SURGERY  Jan 2016   ? Right Heart Catheterization Right 11/06/2014    Performed by Cath, Physician at [facility] CATH LAB   ? CARDIAC DEFIBRILLATOR PLACEMENT  2017   ? Right Heart Catheterization With Insertion Pulmonary Artery Sensor Right 03/30/2015    Performed by Harley Alto, MD at [facility] CATH LAB   ?  Insert CRT-D and Leads Right 11/25/2015    Performed by Deniece Ree, MD at [facility] EP LAB   ? Fluoroscopy N/A 11/25/2015 Performed by Deniece Ree, MD at [facility] EP LAB   ? Defibrillation Threshold Testing at ICD Implant N/A 11/25/2015    Performed by Deniece Ree, MD at [facility] EP LAB   ? VARICOSE VEIN SURGERY Left 01/05/2016    left small saphenous endovenous ablation with left leg microphlebectomy-Dr. Junita Push   ? HX MICROPHLEBECTOMY Right 07/06/2016    [facility]   ? HX ENDOVENOUS ABLATION OF THE SMALL SAPHENOUS VEIN Right 07/06/2016    [facility]   ? ANGIOGRAPHY CORONARY ARTERY WITH LEFT HEART CATHETERIZATION N/A 04/02/2017    Performed by Nat Math, MD at [facility] CATH LAB   ? PERCUTANEOUS CORONARY STENT PLACEMENT WITH ANGIOPLASTY N/A 04/02/2017    Performed by Nat Math, MD at [facility] CATH LAB   ? PERCUTANEOUS CORONARY STENT PLACEMENT WITH ANGIOPLASTY N/A 06/29/2017    Performed by Nat Math, MD at [facility] CATH LAB   ? LEFT ULNAR NERVE DECOMPRESSION AT ELBOW Left 04/30/2018    Performed by Letitia Neri, MD at [facility] OR   ? REMOVAL AND REPLACEMENT IMPLANTABLE DEFIBRILLATOR GENERATOR - MULTIPLE LEAD SYSTEM Right 11/24/2020    Performed by Annamarie Dawley, MD at [facility] EP LAB   ? PERCUTANEOUS TRANSCATHETER PLACEMENT INTRACORONARY STENT WITH/ WITHOUT ANGIOPLASTY - SINGLE MAJOR CORONARY ARTERY/ BRANCH   LT MAIN N/A 05/27/2021    Performed by Julienne Kass, MD at [facility] CATH LAB   ? BRONCHOSCOPY     ? CARDIAC CATHERIZATION  several years ago    [place]   ? DOPPLER ECHOCARDIOGRAPHY     ? HERNIA REPAIR     ? HX CHOLECYSTECTOMY     ? HX HEART CATHETERIZATION     ? HX LUMBAR LAMINECTOMY     ? HX PACEMAKER PLACEMENT       Family History   Problem Relation Age of Onset   ? Cancer Mother         liver   ? Stroke Sister    ? Cancer Father    ? Coronary Artery Disease Father    ? None Reported Sister      Social History     Socioeconomic History   ? Marital status: Married     Spouse name: [name]   ? Number of children: 0   Occupational History   ? Occupation: former Therapist, music: RETIRED     Comment: lives in a 82 year old house with wife   Tobacco Use   ? Smoking status: Former     Packs/day: 2.00     Years: 40.00     Pack years: 80.00     Types: Cigarettes     Quit date: 07/09/1998     Years since quitting: 23.2   ? Smokeless tobacco: Never   Vaping Use   ? Vaping Use: Never used   Substance and Sexual Activity   ? Alcohol use: Yes     Comment: occasion   ? Drug use: Never   ? Sexual activity: Not Currently          ALLERGIES:  Allergies   Allergen Reactions   ? Chlorhexidine BLISTERS and EDEMA   ? Spironolactone HYPOTENSION     Team believes 2/2 VT not spironolactone. Difficult to discern. Retrialing 08/20/21   ? Jardiance [Empagliflozin] UNKNOWN     UTI   ?  Levofloxacin SEE COMMENTS     Prolonged QT       No current facility-administered medications on file prior to encounter.     Current Outpatient Medications on File Prior to Encounter   Medication Sig Dispense Refill   ? albuterol 0.083% (PROVENTIL) 2.5 mg /3 mL (0.083 %) nebulizer solution Inhale 3 mL solution by nebulizer as directed every 6 hours as needed for Wheezing or Shortness of Breath. Indications: asthma 270 mL 11   ? allopurinoL (ZYLOPRIM) 300 mg tablet Take one tablet by mouth daily. take with food 90 tablet 3   ? amiodarone (PACERONE) 400 mg tablet Take 400 mg BID until follow up with EP 120 tablet 0   ? amitr-gabapen-emu oil 05-24-08% topical cream (BATCHED COMPOUND) Apply to affected areas twice daily as needed. 50 g 11   ? amoxicillin-potassium clavulanate (AUGMENTIN) 875/125 mg tablet      ? arformoteroL (BROVANA) 15 mcg/2 mL nebulizer solution Inhale 2 mL solution by nebulizer as directed twice daily. Dx: J44.9 120 mL 11   ? ascorbic acid (vitamin C) 500 mg tablet Take one tablet by mouth daily.     ? ascorbic acid-ascorbate sodium 500 mg chew Chew one tablet by mouth every morning.     ? aspirin EC 81 mg tablet Take one tablet by mouth daily. Take with food.     ? atorvastatin (LIPITOR) 40 mg tablet Take 1 tablet by mouth once daily 90 tablet 1   ? azelastine (ASTELIN) 137 mcg (0.1 %) nasal spray Apply two sprays to each nostril as directed twice daily. Use in each nostril as directed 30 mL 5   ? benzonatate (TESSALON PERLES) 100 mg capsule Take one capsule by mouth every 6 hours as needed for Cough.     ? budesonide (PULMICORT) 0.25 mg/2 mL nebulizer solution Inhale 2 mL solution by nebulizer as directed twice daily. Dx: J44.9 120 mL 11   ? bumetanide (BUMEX) 1 mg tablet Take with 2 mg tablet twice daily to make a total of 3 mg. 90 tablet 1   ? bumetanide (BUMEX) 2 mg tablet Take one tablet twice daily. Take along with 1 mg tablet for a total of 3 mg twice daily. 90 tablet 0   ? cholecalciferol (VITAMIN D-3) 1,000 units tablet Take one tablet by mouth daily.     ? clopiDOGreL (PLAVIX) 75 mg tablet Take one tablet by mouth daily. 90 tablet 0   ? diclofenac sodium (VOLTAREN) 1 % topical gel Apply four g topically to affected area four times daily.     ? DOCOSAHEXAENOIC ACID-EPA PO Take  by mouth daily.     ? docusate (COLACE) 100 mg capsule Take one capsule by mouth daily as needed for Constipation.     ? ferrous gluconate 324 mg (37.5 mg iron) tablet Take one tablet by mouth every 48 hours. 90 tablet    ? finasteride (PROSCAR) 5 mg tablet Take one tablet by mouth daily. 90 tablet 3   ? fish oil- omega 3-DHA/EPA 300/1,000 mg capsule Take one capsule by mouth daily.     ? flash glucose scanning reader (FREESTYLE LIBRE) reader Use as directed. 1 each 0   ? flash glucose sensor (FREESTYLE LIBRE 14 DAY SENSOR) sensor Type 2 diabetes  Indications: type 2 diabetes mellitus 1 each 0   ? gabapentin (NEURONTIN) 100 mg capsule Take four capsules by mouth twice daily. 90 capsule 3   ? guaiFENesin (ROBITUSSIN) 100 mg/5 mL oral solution Take 10  mL by mouth every 4 hours as needed.  0   ? HYDROcodone/acetaminophen (NORCO) 5/325 mg tablet Take one tablet by mouth every 6 hours as needed for Pain.     ? insulin pen needles (disposable) (BD ULTRA-FINE MINI PEN NEEDLE) 31 gauge x 3/16 pen needle Use with insulin pens 300 each 3   ? ipratropium bromide (ATROVENT) 21 mcg (0.03 %) nasal spray Apply two sprays to each nostril as directed every 12 hours. 30 mL 3   ? magnesium oxide 400 mg magnesium capsule Take one capsule by mouth twice daily. 60 capsule 11   ? mepolizumab (NUCALA) 100 mg/mL injection pen Inject 100 mg under the skin every 28 days. 1 each 5   ? metOLazone (ZAROXOLYN) 2.5 mg tablet Take one tablet by mouth daily as needed. PER OUTPATIENT CARDIOLOGY PROVIDER AS NEEDED FOR FLUID RETENTION 90 tablet 0   ? metoprolol succinate XL (TOPROL XL) 25 mg extended release tablet Take one-half tablet by mouth daily. 90 tablet 0   ? mexiletine (MEXITIL) 150 mg capsule Take 150 mg three times daily until follow up with EP  Indications: ventricular arrhythmias, a type of abnormal heart rhythm 180 capsule 0   ? Miscellaneous Medical Supply misc itted for LE gradual compression stockings at a medical supply store 1 each 1   ? Miscellaneous Medical Supply misc Rx: Please wrap legs with ACE bandage from toes as high up to the leg as possible bilaterally  Dx: Lymphedema 2 each 0   ? montelukast (SINGULAIR) 10 mg tablet Take one tablet by mouth at bedtime daily. 30 tablet 11   ? multivit-iron-FA-calcium-mins (THERA-M) 9 mg iron-400 mcg tablet Take one tablet by mouth daily.     ? nitroglycerin (NITROSTAT) 0.4 mg tablet Place one tablet under tongue every 5 minutes as needed for Chest Pain. Max of 3 tablets, call 911. 25 tablet 1   ? pantoprazole DR (PROTONIX) 40 mg tablet Take one tablet by mouth twice daily. 180 tablet 1   ? polyethylene glycol 3350 (MIRALAX) 17 g packet Take one packet by mouth daily as needed.     ? potassium chloride (KLOR-CON M10) 10 mEq tablet Take two tablets by mouth daily. Take with a meal and a full glass of water. 90 tablet 0   ? sodium chloride (HYPER-SAL) 3.5 % inhalation solution Inhale 4 mL by mouth into the lungs twice daily. Dx: J47.9 240 mL 11   ? spironolactone (ALDACTONE) 25 mg tablet Take one-half tablet by mouth daily. Take with food. 90 tablet 0   ? tamsulosin (FLOMAX) 0.4 mg capsule TAKE 1 CAPSULE BY MOUTH ONCE DAILY (DO  NOT  CRUSH,  CHEW  OR  OPEN  CAPSULES  (TAKE  30  MINUTES  FOLLOWING  THE  SAME  MEAL  EACH  DAY). 90 capsule 0   ? thiamine mononitrate (vit B1) 100 mg tablet Take one tablet by mouth daily. 90 tablet    ? traMADoL (ULTRAM) 50 mg tablet Take one tablet by mouth every 6 hours as needed for Pain. 60 tablet 0   ? TRULICITY 0.75 mg/0.5 mL injection pen INJECT 0.5 ML UNDER THE SKIN EVERY 7 DAYS FOR TYPE 2 DIABETES. 4 mL 1   ? venlafaxine XR (EFFEXOR XR) 75 mg capsule Take one capsule by mouth daily. Take with food. 90 capsule 3       Review of Systems   Constitutional: Positive for chills (earlier today). Negative for fever.   Respiratory: Positive for  shortness of breath (on 4L at baseline). Negative for cough.    Cardiovascular: Positive for orthopnea (at baseline) and leg swelling (RLE swelling in last 24hrs). Negative for chest pain.   Gastrointestinal: Positive for blood in stool (intermittent episodes with blood on toilet paper). Negative for abdominal pain, nausea and vomiting.   Genitourinary: Positive for urgency (w/ worsening odor). Negative for dysuria and frequency.         Objective                       Vital Signs: Last Filed                 Vital Signs: 24 Hour Range   BP: 110/45 (08/07 1449)  Temp: 36.4 ?C (97.5 ?F) (08/07 1449)  Pulse: 78 (08/07 1622)  Respirations: 16 PER MINUTE (08/07 1622)  SpO2: 97 % (08/07 1622)  O2 Device: Nasal cannula (08/07 1622)  O2 Liter Flow: 4 Lpm (08/07 1622) BP: (110)/(45-49)   Temp:  [36.4 ?C (97.5 ?F)-36.7 ?C (98.1 ?F)]   Pulse:  [73-78]   Respirations:  [16 PER MINUTE]   SpO2:  [97 %-100 %]   O2 Device: Nasal cannula  O2 Liter Flow: 4 Lpm   Intake/Output: reviewed,       Physical Exam  Vitals and nursing note reviewed.   Constitutional:       General: He is not in acute distress.     Appearance: He is obese. He is not ill-appearing.   HENT:      Head: Normocephalic and atraumatic.   Cardiovascular:      Rate and Rhythm: Normal rate and regular rhythm.      Pulses: Normal pulses.      Heart sounds: Murmur (holosystolic grade 2/6) heard.   Pulmonary:      Effort: Pulmonary effort is normal. No respiratory distress.      Breath sounds: Normal breath sounds.      Comments: Mild crackles noted at bilateral lung bases  Musculoskeletal:         General: Normal range of motion.      Cervical back: Normal range of motion.        Legs:    Neurological:      Mental Status: He is alert.           Lab Review  CBC w/Diff    Lab Results   Component Value Date/Time    WBC 7.6 09/26/2021 11:39 AM    RBC 2.79 (L) 09/26/2021 11:39 AM    HGB 9.3 (L) 09/26/2021 11:39 AM    HCT 28.7 (L) 09/26/2021 11:39 AM    MCV 103.0 (H) 09/26/2021 11:39 AM    MCH 33.2 09/26/2021 11:39 AM    MCHC 32.2 09/26/2021 11:39 AM    RDW 17.6 (H) 09/26/2021 11:39 AM    PLTCT 246 09/26/2021 11:39 AM    MPV 7.9 09/26/2021 11:39 AM    Lab Results   Component Value Date/Time    NEUT 78 (H) 09/26/2021 11:39 AM    ANC 6.01 09/26/2021 11:39 AM    LYMA 12 (L) 09/26/2021 11:39 AM    ALC 0.92 (L) 09/26/2021 11:39 AM    MONA 8 09/26/2021 11:39 AM    AMC 0.63 09/26/2021 11:39 AM    EOSA 1 09/26/2021 11:39 AM    AEC 0.04 09/26/2021 11:39 AM    BASA 1 09/26/2021 11:39 AM    ABC 0.04 09/26/2021 11:39 AM  Comprehensive Metabolic Profile    Lab Results   Component Value Date/Time    NA 131 (L) 09/26/2021 11:39 AM    K 4.2 09/26/2021 11:39 AM    CL 93 (L) 09/26/2021 11:39 AM    CO2 30 09/26/2021 11:39 AM    GAP 8 09/26/2021 11:39 AM    BUN 50 (H) 09/26/2021 11:39 AM    CR 1.31 (H) 09/26/2021 11:39 AM    GLU 73 09/26/2021 11:39 AM    Lab Results   Component Value Date/Time    CA 9.6 09/26/2021 11:39 AM    PO4 2.5 08/18/2021 07:07 PM    ALBUMIN 3.5 09/26/2021 11:39 AM    TOTPROT 6.7 09/26/2021 11:39 AM    ALKPHOS 83 09/26/2021 11:39 AM    AST 21 09/26/2021 11:39 AM    ALT 23 09/26/2021 11:39 AM    TOTBILI 0.6 09/26/2021 11:39 AM    GFR 53 05/13/2021 12:00 AM    GFRAA >60 12/03/2019 04:51 AM          Point of Care Testing  (Last 24 hours)       Radiology and other Diagnostics Review:  Reviewed      Tamsen Roers, DO  Pager [phone]

## 2021-09-26 NOTE — Telephone Encounter
[  10:47 AM] Marcelino Freestone    [facility] [place], I am with Dr. Rosette Reveal in ortho; we have a patient of Dr. Lowella Bandy here who needs to be admitted for a plethora of issues... he's just a sick guy, too sick to send home, but Dr. Rosette Reveal thinks he should be admitted under DR. Comfort with internal medicine rather than under ortho. Would Dr. Crecencio Mc or someone be able to call us to touch base about this?    [10:47 AM] Marcelino Freestone    MRN: [id number]    [10:47 AM] Marcelino Freestone    My phone is [phone]    [10:49 AM] Etta Grandchild    Dr. Comfort's with a patient but I can check with him as soon as he comes out.  Thanks

## 2021-09-26 NOTE — Telephone Encounter
Dr. Crecencio Mc spoke with Dr. Rosette Reveal.  Patient came to clinic, labs drawn, admission called and patient sent to admissions to register to be brought to room.    Etta Grandchild, RN

## 2021-09-26 NOTE — Consults
Ruben Wood      Admission Date: 09/26/2021                                                  Chief Complaint/Reason for Consult: Chronic osteomyelitis of the right femur    Assessment/Plan     Ruben Klym. is a 81 y.o. male with a pmh of abdominal aortic aneurysm, atrial fibrillation, BPH, bronchiectasis, COPD, coronary artery disease, cardiomyopathy, congestive heart failure, chronic kidney disease, diabetes, lymphedema complicated by episodes of cellulitis who presents with MRI evidence of chronic osteomyelitis of the right femur and decreased urinary output.  The patient was evaluated by Dr. Rosette Reveal in clinic today.  Given this patient's medical comorbidities he would likely not bony debridement for surgical management of his osteomyelitis.  We recommend medical management and an infectious disease consult.      -Surgical intervention:  No acute surgical intervention at this time  -WB Status -weightbearing as tolerated  -Diet: Regular  -Pain control/medical management per primary  -Abx/tetanus: Per Primary   -Last PO intake: Unknown   -PPx: Per Primary     Procedures performed: None       Patient discussed with staff surgeon Dr. Rosette Reveal who directed plan of care.     During normal business hours, please contact [employer]. At all other times, contact the orthopedic surgery resident on call for any questions or concerns.        Collene Schlichter, MD  [id number]  ______________________________________________________________________      History of Present Illness: Ruben Stute. is a 81 y.o. male with pmh described above who presents with right femur pain.  He notes this has been going on since about January.  He also notes that that is progressively worsened since that time.  He was evaluated in clinic today by Dr. Rosette Reveal.  MRI showed evidence of chronic osteomyelitis of the right femur.  He d does report some recent infections including pulmonary infections and urinary tract infections.  Additionally he reports some recent episodes of cellulitis.  He was recently started on vancomycin and completed this course.  He denies any fevers, chills, nausea, vomiting.  He additionally denies any significant recent weight loss.  He notes that his goals of care are to decrease his pain and hopefully be able to continue to mobilize.  He mobilizes at baseline with a walker.    Medical History:   Diagnosis Date   ? AAA (abdominal aortic aneurysm) (HCC) 10/15/2012   ? Asthma    ? Atrial fibrillation (HCC)    ? BPH with obstruction/lower urinary tract symptoms    ? Bronchiectasis (HCC)    ? CAD (coronary artery disease), native coronary artery 08/15/2012    07/11/2002- Cath @ [facility] showed Severe double vessel disease(OMB of circumflex and distal circ)  inferior-basilar dyskinesis.                 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  09/12/12   Cath    ? Cardiomyopathy (HCC) 07/19/2014   ? Cellulitis of left lower extremity 11/12/2014    10/2014: admitted with cellulitis, [place] negative for venous clot, MRI without osteomyelitis.  11/12/2014 In clinic today, still with cellulitis.  Per patient and his wife, the erythema may be extending.  Cultures from drainage in hospital with MSSA, but Blood Cx negative.  No e/o osteomyelitis.  My concern is that this antibiotic regimen is not adequately treating his cellulitis.  We will broaden coverage to get MRSA with doxycycline.  Patient and wife given strict call/return criteria.  I will see patient in 3-4 days for re-evaluation.  No fever today.  Plan:  Stop cefpodoxime and start doxycylcine  11/17/2014 Much better, no warmth and erythema minimal.   Plan: continue doxycyline for full 10 day course.      ? Chest wall deformity 07/09/2012    Chest wall reconstruction on 07/09/12    ? CHF (congestive heart failure) (HCC)    ? 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 Chronic cough 08/29/2012   ? 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 COPD (chronic obstructive pulmonary disease) (HCC) 08/13/2012   ? Diabetes (HCC)    ? Dyspnea    ? Edema    ? GERD (gastroesophageal reflux disease) 08/13/2012   ? History of CHF (congestive heart failure) 08/13/2012   ? History of MRSA infection 10/14/2012   ? 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    ? Hypertension 08/13/2012   ? IDA (iron deficiency anemia)    ? 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 Morbid obesity (HCC) 08/13/2012   ? On supplemental oxygen therapy    ? OSA on CPAP 08/13/2012   ? Pneumonia 04/2012    [facility]ina Pillion- [facility]   ? Pneumonia due to COVID-19 virus 12/05/2018   ? Seasonal allergic reaction      Surgical History:   Procedure Laterality Date   ? LUNG SURGERY  2015    Bio bridge in left lung/rib cage was broken  from coughing   ? BACK SURGERY  Jan 2016   ? Right Heart Catheterization Right 11/06/2014    Performed by Cath, Physician at [facility] CATH LAB   ? CARDIAC DEFIBRILLATOR PLACEMENT  2017   ? Right Heart Catheterization With Insertion Pulmonary Artery Sensor Right 03/30/2015    Performed by Harley Alto, MD at [facility] CATH LAB   ? Insert CRT-D and Leads Right 11/25/2015    Performed by Deniece Ree, MD at [facility] EP LAB   ? Fluoroscopy N/A 11/25/2015    Performed by Deniece Ree, MD at [facility] EP LAB   ? Defibrillation Threshold Testing at ICD Implant N/A 11/25/2015    Performed by Deniece Ree, MD at [facility] EP LAB   ? VARICOSE VEIN SURGERY Left 01/05/2016    left small saphenous endovenous ablation with left leg microphlebectomy-Dr. Junita Push   ? HX MICROPHLEBECTOMY Right 07/06/2016    [place]   ? HX ENDOVENOUS ABLATION OF THE SMALL SAPHENOUS VEIN Right 07/06/2016    [place]   ? ANGIOGRAPHY CORONARY ARTERY WITH LEFT HEART CATHETERIZATION N/A 04/02/2017    Performed by  Nat Math, MD at [facility] CATH LAB   ? PERCUTANEOUS CORONARY STENT PLACEMENT WITH ANGIOPLASTY N/A 04/02/2017    Performed by Nat Math, MD at [facility] CATH LAB   ? PERCUTANEOUS CORONARY STENT PLACEMENT WITH ANGIOPLASTY N/A 06/29/2017    Performed by Nat Math, MD at [facility] CATH LAB   ? LEFT ULNAR NERVE DECOMPRESSION AT ELBOW Left 04/30/2018    Performed by Letitia Neri, MD at [facility] OR   ? REMOVAL AND REPLACEMENT IMPLANTABLE DEFIBRILLATOR GENERATOR - MULTIPLE LEAD SYSTEM Right 11/24/2020    Performed by Annamarie Dawley, MD at [facility] EP LAB   ? PERCUTANEOUS TRANSCATHETER PLACEMENT INTRACORONARY STENT WITH/ WITHOUT ANGIOPLASTY - SINGLE MAJOR CORONARY ARTERY/ BRANCH   LT MAIN N/A 05/27/2021    Performed by Julienne Kass, MD at [facility] CATH LAB   ? BRONCHOSCOPY     ? CARDIAC CATHERIZATION  several years ago    [place]   ? DOPPLER ECHOCARDIOGRAPHY     ? HERNIA REPAIR     ? HX CHOLECYSTECTOMY     ? HX HEART CATHETERIZATION     ? HX LUMBAR LAMINECTOMY     ? HX PACEMAKER PLACEMENT       Social History     Tobacco Use   ? Smoking status: Former     Packs/day: 2.00     Years: 40.00     Pack years: 80.00     Types: Cigarettes     Quit date: 07/09/1998     Years since quitting: 23.2   ? Smokeless tobacco: Never   Vaping Use   ? Vaping Use: Never used   Substance Use Topics   ? Alcohol use: Yes     Comment: occasion   ? Drug use: Never       Tobacco: Former  Alcohol: Denies  Drugs: Denies    Family Hx: Reviewed and non-contributory  Allergies:  Chlorhexidine, Spironolactone, Jardiance [empagliflozin], and Levofloxacin    Outpatient Medications as of 09/26/2021   Medication Sig Dispense Refill   ? albuterol 0.083% (PROVENTIL) 2.5 mg /3 mL (0.083 %) nebulizer solution Inhale 3 mL solution by nebulizer as directed every 6 hours as needed for Wheezing or Shortness of Breath. Indications: asthma 270 mL 11   ? allopurinoL (ZYLOPRIM) 300 mg tablet Take one tablet by mouth daily. take with food 90 tablet 3   ? amiodarone (PACERONE) 400 mg tablet Take 400 mg BID until follow up with EP 120 tablet 0   ? amitr-gabapen-emu oil 05-24-08% topical cream (BATCHED COMPOUND) Apply to affected areas twice daily as needed. 50 g 11   ? amoxicillin-potassium clavulanate (AUGMENTIN) 875/125 mg tablet      ? arformoteroL (BROVANA) 15 mcg/2 mL nebulizer solution Inhale 2 mL solution by nebulizer as directed twice daily. Dx: J44.9 120 mL 11   ? ascorbic acid (vitamin C) 500 mg tablet Take one tablet by mouth daily.     ? ascorbic acid-ascorbate sodium 500 mg chew Chew one tablet by mouth every morning.     ? aspirin EC 81 mg tablet Take one tablet by mouth daily. Take with food.     ? atorvastatin (LIPITOR) 40 mg tablet Take 1 tablet by mouth once daily 90 tablet 1   ? azelastine (ASTELIN) 137 mcg (0.1 %) nasal spray Apply two sprays to each nostril as directed twice daily. Use in each nostril as directed 30 mL 5   ? benzonatate (TESSALON PERLES) 100 mg capsule Take one capsule  by mouth every 6 hours as needed for Cough.     ? budesonide (PULMICORT) 0.25 mg/2 mL nebulizer solution Inhale 2 mL solution by nebulizer as directed twice daily. Dx: J44.9 120 mL 11   ? bumetanide (BUMEX) 1 mg tablet Take with 2 mg tablet twice daily to make a total of 3 mg. 90 tablet 1   ? bumetanide (BUMEX) 2 mg tablet Take one tablet twice daily. Take along with 1 mg tablet for a total of 3 mg twice daily. 90 tablet 0   ? cholecalciferol (VITAMIN D-3) 1,000 units tablet Take one tablet by mouth daily.     ? clopiDOGreL (PLAVIX) 75 mg tablet Take one tablet by mouth daily. 90 tablet 0   ? diclofenac sodium (VOLTAREN) 1 % topical gel Apply four g topically to affected area four times daily.     ? DOCOSAHEXAENOIC ACID-EPA PO Take  by mouth daily.     ? docusate (COLACE) 100 mg capsule Take one capsule by mouth daily as needed for Constipation.     ? ferrous gluconate 324 mg (37.5 mg iron) tablet Take one tablet by mouth every 48 hours. 90 tablet    ? finasteride (PROSCAR) 5 mg tablet Take one tablet by mouth daily. 90 tablet 3   ? fish oil- omega 3-DHA/EPA 300/1,000 mg capsule Take one capsule by mouth daily.     ? flash glucose scanning reader (FREESTYLE LIBRE) reader Use as directed. 1 each 0   ? flash glucose sensor (FREESTYLE LIBRE 14 DAY SENSOR) sensor Type 2 diabetes  Indications: type 2 diabetes mellitus 1 each 0   ? gabapentin (NEURONTIN) 100 mg capsule Take four capsules by mouth twice daily. 90 capsule 3   ? guaiFENesin (ROBITUSSIN) 100 mg/5 mL oral solution Take 10 mL by mouth every 4 hours as needed.  0   ? HYDROcodone/acetaminophen (NORCO) 5/325 mg tablet Take one tablet by mouth every 6 hours as needed for Pain.     ? insulin pen needles (disposable) (BD ULTRA-FINE MINI PEN NEEDLE) 31 gauge x 3/16 pen needle Use with insulin pens 300 each 3   ? ipratropium bromide (ATROVENT) 21 mcg (0.03 %) nasal spray Apply two sprays to each nostril as directed every 12 hours. 30 mL 3   ? magnesium oxide 400 mg magnesium capsule Take one capsule by mouth twice daily. 60 capsule 11   ? mepolizumab (NUCALA) 100 mg/mL injection pen Inject 100 mg under the skin every 28 days. 1 each 5   ? metOLazone (ZAROXOLYN) 2.5 mg tablet Take one tablet by mouth daily as needed. PER OUTPATIENT CARDIOLOGY PROVIDER AS NEEDED FOR FLUID RETENTION 90 tablet 0   ? metoprolol succinate XL (TOPROL XL) 25 mg extended release tablet Take one-half tablet by mouth daily. 90 tablet 0   ? mexiletine (MEXITIL) 150 mg capsule Take 150 mg three times daily until follow up with EP  Indications: ventricular arrhythmias, a type of abnormal heart rhythm 180 capsule 0   ? Miscellaneous Medical Supply misc itted for LE gradual compression stockings at a medical supply store 1 each 1   ? Miscellaneous Medical Supply misc Rx: Please wrap legs with ACE bandage from toes as high up to the leg as possible bilaterally  Dx: Lymphedema 2 each 0   ? montelukast (SINGULAIR) 10 mg tablet Take one tablet by mouth at bedtime daily. 30 tablet 11   ? multivit-iron-FA-calcium-mins (THERA-M) 9 mg iron-400 mcg tablet Take one tablet by mouth daily.     ?  nitroglycerin (NITROSTAT) 0.4 mg tablet Place one tablet under tongue every 5 minutes as needed for Chest Pain. Max of 3 tablets, call 911. 25 tablet 1   ? pantoprazole DR (PROTONIX) 40 mg tablet Take one tablet by mouth twice daily. 180 tablet 1   ? polyethylene glycol 3350 (MIRALAX) 17 g packet Take one packet by mouth daily as needed.     ? potassium chloride (KLOR-CON M10) 10 mEq tablet Take two tablets by mouth daily. Take with a meal and a full glass of water. 90 tablet 0   ? sodium chloride (HYPER-SAL) 3.5 % inhalation solution Inhale 4 mL by mouth into the lungs twice daily. Dx: J47.9 240 mL 11   ? spironolactone (ALDACTONE) 25 mg tablet Take one-half tablet by mouth daily. Take with food. 90 tablet 0   ? tamsulosin (FLOMAX) 0.4 mg capsule TAKE 1 CAPSULE BY MOUTH ONCE DAILY (DO  NOT  CRUSH,  CHEW  OR  OPEN  CAPSULES  (TAKE  30  MINUTES  FOLLOWING  THE  SAME  MEAL  EACH  DAY). 90 capsule 0   ? thiamine mononitrate (vit B1) 100 mg tablet Take one tablet by mouth daily. 90 tablet    ? traMADoL (ULTRAM) 50 mg tablet Take one tablet by mouth every 6 hours as needed for Pain. 60 tablet 0   ? TRULICITY 0.75 mg/0.5 mL injection pen INJECT 0.5 ML UNDER THE SKIN EVERY 7 DAYS FOR TYPE 2 DIABETES. 4 mL 1   ? venlafaxine XR (EFFEXOR XR) 75 mg capsule Take one capsule by mouth daily. Take with food. 90 capsule 3         Review of Systems:    Positive: Leg pain  Negative: Fevers    Otherwise, 10 point ROS was negative except the pertinent information included in the HPI    Vital Signs:  Last Filed in 24 hours   BP: 110/45 (08/07 1449)  Temp: 36.4 ?C (97.5 ?F) (08/07 1449)  Pulse: 78 (08/07 1622)  Respirations: 16 PER MINUTE (08/07 1622)  SpO2: 97 % (08/07 1622)  O2 Device: Nasal cannula (08/07 1622)  O2 Liter Flow: 4 Lpm (08/07 1622)     Physical Exam:    Constitutional: A&O, NAD  HEENT: EOMI, normocephalic  Respiratory:  Unlabored respirations  Cardiovascular: Regular rate  Skin: No open fractures, abrasion on right shin and left hand   Musculoskeletal:  Extremities:    RLE: NVI distally, TA/GS, EHL/FHL intact.  SILT in DP/SP/Tib/Sur/Saph distributions, DP/PT palpable, Cap refill < 2 sec, warm extremity, soft compartments.  The patient is able to range his knee without significant pain.  He does have a roughly 20 degree flexion contracture of the right knee.  He is tender to palpation over the anterior lateral and anteromedial aspect of the distal femur.  He does not have significant tenderness more proximally.  He has some mild cellulitis of the right foot.  He has more significant cellulitis on the left side.  He has pitting edema on bilateral lower extremities.  He has a right shin abrasion from a recent fall.            Lab/Radiology/Other Diagnostic Tests:    CHEST SINGLE VIEW    (Results Pending)        Radiology: Reviewed    CBC w/Diff   Lab Results   Component Value Date/Time    WBC 7.6 09/26/2021 11:39 AM    HGB 9.3 (L) 09/26/2021 11:39 AM  HCT 28.7 (L) 09/26/2021 11:39 AM    PLTCT 246 09/26/2021 11:39 AM         Inflammatory Markers   Lab Results   Component Value Date/Time    ESR 80 (H) 07/20/2021 09:53 PM    CRP 10.01 (H) 07/20/2021 09:53 PM        Coagulation Studies   Lab Results   Component Value Date/Time    PT 11.4 07/20/2021 09:53 PM    PTT 28.2 07/20/2021 09:53 PM    INR 1.0 07/20/2021 09:53 PM Basic Metabolic Profile   Lab Results   Component Value Date/Time    NA 131 (L) 09/26/2021 11:39 AM    K 4.2 09/26/2021 11:39 AM    CL 93 (L) 09/26/2021 11:39 AM    CO2 30 09/26/2021 11:39 AM    GAP 8 09/26/2021 11:39 AM    BUN 50 (H) 09/26/2021 11:39 AM    CR 1.31 (H) 09/26/2021 11:39 AM    GLU 73 09/26/2021 11:39 AM

## 2021-09-26 NOTE — Progress Notes
Date of Service: 09/26/2021    Subjective:             Ruben Wood. Ruben Wood is a 81 y.o. male.    History of Present Illness  Ruben Wood is referred for evaluation of his right femur.  He started having issues with this around January of this year.  He noted onset of lateral and posterior right distal thigh and knee swelling and pain.  He notes that the area became red.  He denies any history of injury to this area.  However, he has had several infections, including UTIs and lung infections.  He had fevers related to the lung infection.  These infections occurred prior to the onset of pain in his RLE.  He has chronic BLE lymphedema and neuropathy and recurrent bilateral calf cellulitis. Recently, he developed open wounds over his legs.  X-rays obtained locally were concerning for osteomyelitis, and he was placed on Vancomycin.  He notes that the redness resolved and his pain improved with the Vancomycin.  However, he completed his course of antibiotics a couple of days ago, and the redness has returned to his leg and his pain is getting worse.  He has pain at all times that is worse with walking.  Dr. Desiree Hane asked that I look at the patient's plain films when he was in his clinic, and I recommended obtaining an MRI.       Review of Systems  Denies recent fevers or chills. No change in appetite or weight loss.  His wife notes that his urine is foul-smelling.      Objective:         ? albuterol 0.083% (PROVENTIL) 2.5 mg /3 mL (0.083 %) nebulizer solution Inhale 3 mL solution by nebulizer as directed every 6 hours as needed for Wheezing or Shortness of Breath. Indications: asthma   ? allopurinoL (ZYLOPRIM) 300 mg tablet Take one tablet by mouth daily. take with food   ? amiodarone (PACERONE) 400 mg tablet Take 400 mg BID until follow up with EP   ? amitr-gabapen-emu oil 05-24-08% topical cream (BATCHED COMPOUND) Apply to affected areas twice daily as needed.   ? amoxicillin-potassium clavulanate (AUGMENTIN) 875/125 mg tablet    ? arformoteroL (BROVANA) 15 mcg/2 mL nebulizer solution Inhale 2 mL solution by nebulizer as directed twice daily. Dx: J44.9   ? ascorbic acid (vitamin C) 500 mg tablet Take one tablet by mouth daily.   ? ascorbic acid-ascorbate sodium 500 mg chew Chew one tablet by mouth every morning.   ? aspirin EC 81 mg tablet Take one tablet by mouth daily. Take with food.   ? atorvastatin (LIPITOR) 40 mg tablet Take 1 tablet by mouth once daily   ? azelastine (ASTELIN) 137 mcg (0.1 %) nasal spray Apply two sprays to each nostril as directed twice daily. Use in each nostril as directed   ? benzonatate (TESSALON PERLES) 100 mg capsule Take one capsule by mouth every 6 hours as needed for Cough.   ? budesonide (PULMICORT) 0.25 mg/2 mL nebulizer solution Inhale 2 mL solution by nebulizer as directed twice daily. Dx: J44.9   ? bumetanide (BUMEX) 1 mg tablet Take with 2 mg tablet twice daily to make a total of 3 mg.   ? bumetanide (BUMEX) 2 mg tablet Take one tablet twice daily. Take along with 1 mg tablet for a total of 3 mg twice daily.   ? cholecalciferol (VITAMIN D-3) 1,000 units tablet Take one tablet by mouth daily.   ?  clopiDOGreL (PLAVIX) 75 mg tablet Take one tablet by mouth daily.   ? diclofenac sodium (VOLTAREN) 1 % topical gel Apply four g topically to affected area four times daily.   ? DOCOSAHEXAENOIC ACID-EPA PO Take  by mouth daily.   ? docusate (COLACE) 100 mg capsule Take one capsule by mouth daily as needed for Constipation.   ? ferrous gluconate 324 mg (37.5 mg iron) tablet Take one tablet by mouth every 48 hours.   ? finasteride (PROSCAR) 5 mg tablet Take one tablet by mouth daily.   ? fish oil- omega 3-DHA/EPA 300/1,000 mg capsule Take one capsule by mouth daily.   ? flash glucose scanning reader (FREESTYLE LIBRE) reader Use as directed.   ? flash glucose sensor (FREESTYLE LIBRE 14 DAY SENSOR) sensor Type 2 diabetes  Indications: type 2 diabetes mellitus   ? gabapentin (NEURONTIN) 100 mg capsule Take four capsules by mouth twice daily.   ? guaiFENesin (ROBITUSSIN) 100 mg/5 mL oral solution Take 10 mL by mouth every 4 hours as needed.   ? HYDROcodone/acetaminophen (NORCO) 5/325 mg tablet Take one tablet by mouth every 6 hours as needed for Pain.   ? insulin pen needles (disposable) (BD ULTRA-FINE MINI PEN NEEDLE) 31 gauge x 3/16 pen needle Use with insulin pens   ? ipratropium bromide (ATROVENT) 21 mcg (0.03 %) nasal spray Apply two sprays to each nostril as directed every 12 hours.   ? magnesium oxide 400 mg magnesium capsule Take one capsule by mouth twice daily.   ? mepolizumab (NUCALA) 100 mg/mL injection pen Inject 100 mg under the skin every 28 days.   ? metOLazone (ZAROXOLYN) 2.5 mg tablet Take one tablet by mouth daily as needed. PER OUTPATIENT CARDIOLOGY PROVIDER AS NEEDED FOR FLUID RETENTION   ? metoprolol succinate XL (TOPROL XL) 25 mg extended release tablet Take one-half tablet by mouth daily.   ? mexiletine (MEXITIL) 150 mg capsule Take 150 mg three times daily until follow up with EP  Indications: ventricular arrhythmias, a type of abnormal heart rhythm   ? Miscellaneous Medical Supply misc itted for LE gradual compression stockings at a medical supply store   ? Miscellaneous Medical Supply misc Rx: Please wrap legs with ACE bandage from toes as high up to the leg as possible bilaterally  Dx: Lymphedema   ? montelukast (SINGULAIR) 10 mg tablet Take one tablet by mouth at bedtime daily.   ? multivit-iron-FA-calcium-mins (THERA-M) 9 mg iron-400 mcg tablet Take one tablet by mouth daily.   ? nitroglycerin (NITROSTAT) 0.4 mg tablet Place one tablet under tongue every 5 minutes as needed for Chest Pain. Max of 3 tablets, call 911.   ? pantoprazole DR (PROTONIX) 40 mg tablet Take one tablet by mouth twice daily.   ? polyethylene glycol 3350 (MIRALAX) 17 g packet Take one packet by mouth daily as needed.   ? potassium chloride (KLOR-CON M10) 10 mEq tablet Take two tablets by mouth daily. Take with a meal and a full glass of water.   ? sodium chloride (HYPER-SAL) 3.5 % inhalation solution Inhale 4 mL by mouth into the lungs twice daily. 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 spironolactone (ALDACTONE) 25 mg tablet Take one-half tablet by mouth daily. Take with food.   ? tamsulosin (FLOMAX) 0.4 mg capsule TAKE 1 CAPSULE BY MOUTH ONCE DAILY (DO  NOT  CRUSH,  CHEW  OR  OPEN  CAPSULES  (TAKE  30  MINUTES  FOLLOWING  THE  SAME  MEAL  EACH  DAY).   ?  thiamine mononitrate (vit B1) 100 mg tablet Take one tablet by mouth daily.   ? traMADoL (ULTRAM) 50 mg tablet Take one tablet by mouth every 6 hours as needed for Pain.   ? TRULICITY 0.75 mg/0.5 mL injection pen INJECT 0.5 ML UNDER THE SKIN EVERY 7 DAYS FOR TYPE 2 DIABETES.   ? venlafaxine XR (EFFEXOR XR) 75 mg capsule Take one capsule by mouth daily. Take with food.     Vitals:    09/26/21 1003   PainSc: Three     There is no height or weight on file to calculate BMI.     Physical Exam  Mr. Musich has palpable fullness over his right lateral knee and extending posteriorly. The area is not red but it is hot.  There is no warmth or erythema or swelling along his left knee.  He is tender to palpation along his right lateral knee. He has no joint effusion.  He has 0-110 degrees PROM of his right knee.   I reviewed his prior plain films and his MRI.  The former demonstrate diffuse changes in his right distal femur and associated periosteal reaction.  His MRI demonstrates necrotic bone within his distal femur with surround bone marrow and soft tissue edema.  There is also a posterior fluid collection.         Assessment and Plan:  I told Mr. Holtsclaw and his wife that the plain films were consistent with an active process, either tumor or infection.  The MRI was obtained to differentiate between the 2.  Based on the MRI, I think that this represents osteomyelitis.  The improvement in his symptoms while on Vancomycin also points toward an infection.  I am not sure if the necrotic bone was a result of the infection or was pre-existing. We discussed that treating infection around dead bone can be challenging.  It also sounds as though his UTI has recurred.  He has not had any penetrating injury to his RLE; I think that his infection likely was spread hematogenously from his multiple sources (urine, lung, LE cellulitis).  We discussed that the preferred option would be surgical debridement of the infected, dead bone.  However, he is not a candidate for surgery, given his significant PMH.  In addition, I'm concerned about him becoming septic from one of his current infections, and he would likely not tolerate that given his cardiac and pulmonary disease.  I contacted Dr. Crecencio Mc to discuss admission, as I think he is too complicated and sick to be able to manage as an outpatient, especially given the number of services that would likely be involved in his care.  I would be happy to see him while in house to further discuss his bone infection; however, this would be more in the purview of the ID service.  He may also need wound care to see him for his LE cellulitis and open wounds.  All of their questions were answered.

## 2021-09-26 NOTE — Progress Notes
History of Present Illness  Ruben Wood. is a 81 y.o. male is here today for an urgent add-on appointment.    He was being seen down in the orthopedic clinic with Dr. Rosette Reveal today.  He had been recently admitted to [facility] in [place].  Initially this was for heart failure decompensation admission, but he was ultimately found to have cellulitis as well as osteomyelitis.  He was treated with IV vancomycin.  I am having trouble finding all of the specific records in the chart, but per [name] and his wife Ruben Wood, he was on vancomycin from 09/02/2021 until 09/21/2021.  The vancomycin was stopped at that time and has been off of antibiotics now for the past 5 days or so.  In Dr. Kirkland Hun office, she noticed that his leg was warm and red and she was concerned about more systemic infection as well as cellulitis.  It is noticed that the leg has become more red and swollen over the past few days.  He does not have any significant pain.  He denies any fevers or chills.  He had an MRI performed on 09/22/2021 that showed findings that were consistent with chronic infection of the femoral diaphysis.    He is also having change in urinary odor.  He does not have any change in frequency or pain with urination, but he is worried about a urinary tract infection.    He also has a very painful sacral ulcer.  He saw wound specialist while he was in the hospital [facility].  He was given a new cream, but he is not sure of the name of this.  He has been placing that on top of the ulcer.  He has noticed that the pain at the ulcer site has increased.  He has not noticed any significant drainage.         Review of Systems  Per HPI    Objective:         ? albuterol 0.083% (PROVENTIL) 2.5 mg /3 mL (0.083 %) nebulizer solution Inhale 3 mL solution by nebulizer as directed every 6 hours as needed for Wheezing or Shortness of Breath. Indications: asthma   ? allopurinoL (ZYLOPRIM) 300 mg tablet Take one tablet by mouth daily. take with food   ? amiodarone (PACERONE) 400 mg tablet Take 400 mg BID until follow up with EP   ? amitr-gabapen-emu oil 05-24-08% topical cream (BATCHED COMPOUND) Apply to affected areas twice daily as needed.   ? amoxicillin-potassium clavulanate (AUGMENTIN) 875/125 mg tablet    ? arformoteroL (BROVANA) 15 mcg/2 mL nebulizer solution Inhale 2 mL solution by nebulizer as directed twice daily. Dx: J44.9   ? ascorbic acid (vitamin C) 500 mg tablet Take one tablet by mouth daily.   ? ascorbic acid-ascorbate sodium 500 mg chew Chew one tablet by mouth every morning.   ? aspirin EC 81 mg tablet Take one tablet by mouth daily. Take with food.   ? atorvastatin (LIPITOR) 40 mg tablet Take 1 tablet by mouth once daily   ? azelastine (ASTELIN) 137 mcg (0.1 %) nasal spray Apply two sprays to each nostril as directed twice daily. Use in each nostril as directed   ? benzonatate (TESSALON PERLES) 100 mg capsule Take one capsule by mouth every 6 hours as needed for Cough.   ? budesonide (PULMICORT) 0.25 mg/2 mL nebulizer solution Inhale 2 mL solution by nebulizer as directed twice daily. Dx: J44.9   ? bumetanide (BUMEX) 1 mg tablet  Take with 2 mg tablet twice daily to make a total of 3 mg.   ? bumetanide (BUMEX) 2 mg tablet Take one tablet twice daily. Take along with 1 mg tablet for a total of 3 mg twice daily.   ? cholecalciferol (VITAMIN D-3) 1,000 units tablet Take one tablet by mouth daily.   ? clopiDOGreL (PLAVIX) 75 mg tablet Take one tablet by mouth daily.   ? diclofenac sodium (VOLTAREN) 1 % topical gel Apply four g topically to affected area four times daily.   ? DOCOSAHEXAENOIC ACID-EPA PO Take  by mouth daily.   ? docusate (COLACE) 100 mg capsule Take one capsule by mouth daily as needed for Constipation.   ? ferrous gluconate 324 mg (37.5 mg iron) tablet Take one tablet by mouth every 48 hours.   ? finasteride (PROSCAR) 5 mg tablet Take one tablet by mouth daily.   ? fish oil- omega 3-DHA/EPA 300/1,000 mg capsule Take one capsule by mouth daily.   ? flash glucose scanning reader (FREESTYLE LIBRE) reader Use as directed.   ? flash glucose sensor (FREESTYLE LIBRE 14 DAY SENSOR) sensor Type 2 diabetes  Indications: type 2 diabetes mellitus   ? gabapentin (NEURONTIN) 100 mg capsule Take four capsules by mouth twice daily.   ? guaiFENesin (ROBITUSSIN) 100 mg/5 mL oral solution Take 10 mL by mouth every 4 hours as needed.   ? HYDROcodone/acetaminophen (NORCO) 5/325 mg tablet Take one tablet by mouth every 6 hours as needed for Pain.   ? insulin pen needles (disposable) (BD ULTRA-FINE MINI PEN NEEDLE) 31 gauge x 3/16 pen needle Use with insulin pens   ? ipratropium bromide (ATROVENT) 21 mcg (0.03 %) nasal spray Apply two sprays to each nostril as directed every 12 hours.   ? magnesium oxide 400 mg magnesium capsule Take one capsule by mouth twice daily.   ? mepolizumab (NUCALA) 100 mg/mL injection pen Inject 100 mg under the skin every 28 days.   ? metOLazone (ZAROXOLYN) 2.5 mg tablet Take one tablet by mouth daily as needed. PER OUTPATIENT CARDIOLOGY PROVIDER AS NEEDED FOR FLUID RETENTION   ? metoprolol succinate XL (TOPROL XL) 25 mg extended release tablet Take one-half tablet by mouth daily.   ? mexiletine (MEXITIL) 150 mg capsule Take 150 mg three times daily until follow up with EP  Indications: ventricular arrhythmias, a type of abnormal heart rhythm   ? Miscellaneous Medical Supply misc itted for LE gradual compression stockings at a medical supply store   ? Miscellaneous Medical Supply misc Rx: Please wrap legs with ACE bandage from toes as high up to the leg as possible bilaterally  Dx: Lymphedema   ? montelukast (SINGULAIR) 10 mg tablet Take one tablet by mouth at bedtime daily.   ? multivit-iron-FA-calcium-mins (THERA-M) 9 mg iron-400 mcg tablet Take one tablet by mouth daily.   ? nitroglycerin (NITROSTAT) 0.4 mg tablet Place one tablet under tongue every 5 minutes as needed for Chest Pain. Max of 3 tablets, call 911.   ? pantoprazole DR (PROTONIX) 40 mg tablet Take one tablet by mouth twice daily.   ? polyethylene glycol 3350 (MIRALAX) 17 g packet Take one packet by mouth daily as needed.   ? potassium chloride (KLOR-CON M10) 10 mEq tablet Take two tablets by mouth daily. Take with a meal and a full glass of water.   ? sodium chloride (HYPER-SAL) 3.5 % inhalation solution Inhale 4 mL by mouth into the lungs twice daily. 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 spironolactone (  ALDACTONE) 25 mg tablet Take one-half tablet by mouth daily. Take with food.   ? tamsulosin (FLOMAX) 0.4 mg capsule TAKE 1 CAPSULE BY MOUTH ONCE DAILY (DO  NOT  CRUSH,  CHEW  OR  OPEN  CAPSULES  (TAKE  30  MINUTES  FOLLOWING  THE  SAME  MEAL  EACH  DAY).   ? thiamine mononitrate (vit B1) 100 mg tablet Take one tablet by mouth daily.   ? traMADoL (ULTRAM) 50 mg tablet Take one tablet by mouth every 6 hours as needed for Pain.   ? TRULICITY 0.75 mg/0.5 mL injection pen INJECT 0.5 ML UNDER THE SKIN EVERY 7 DAYS FOR TYPE 2 DIABETES.   ? venlafaxine XR (EFFEXOR XR) 75 mg capsule Take one capsule by mouth daily. Take with food.     Vitals:    09/26/21 1111   PainSc: Three  Comment: documented by orhto     There is no height or weight on file to calculate BMI.     Physical Exam  Vitals and nursing note reviewed.   Constitutional:       General: He is not in acute distress.     Appearance: He is well-developed. He is not diaphoretic.   HENT:      Head: Normocephalic and atraumatic.   Neck:      Thyroid: No thyromegaly.      Vascular: No JVD.   Cardiovascular:      Rate and Rhythm: Normal rate and regular rhythm.      Heart sounds: Normal heart sounds. No murmur heard.     No friction rub. No gallop.   Pulmonary:      Effort: Pulmonary effort is normal. No respiratory distress.      Breath sounds: Normal breath sounds. No wheezing or rales.   Musculoskeletal:      Right lower leg: No edema.      Left lower leg: No edema.   Skin:     Findings: Erythema present.      Comments: Erythema and warmth on right lower ext.  He also has a ulcer in the sacral region.  This is difficult to fully assess in the clinic as his mobility is limited as his mobility is limited and I was unable to get up on the exam table for a full exam.  What I could see, appears to be ulcerative lesion with overlying pasty cream which is obscuring any view of any drainage.   Neurological:      Mental Status: He is alert.         Labwork reviewed:  Lab Results   Component Value Date/Time    HGBA1C 6.1 (H) 05/11/2021 12:46 PM    HGBA1C 6.7 (H) 04/04/2020 05:40 AM    HGBA1C 5.9 11/25/2019 02:45 PM    A1C 5.8 05/11/2021 12:00 AM    HGBPOC 9.5 (L) 07/24/2021 07:04 AM    HGBPOC 14.3 04/08/2020 06:58 AM    HGBPOC 11.2 (L) 10/25/2012 10:58 AM    MCALBR 10.8 05/11/2021 11:40 AM    TSH 4.41 08/18/2021 05:00 PM    FREET4R 0.9 05/30/2021 04:30 PM    CHOL 161 04/04/2020 05:40 AM    TRIG 152 (H) 04/04/2020 05:40 AM    HDL 53 04/04/2020 05:40 AM    LDL 87 04/04/2020 05:40 AM    NA 134 (L) 08/21/2021 04:14 AM    K 4.5 08/21/2021 04:14 AM    CL 92 (L) 08/21/2021 04:14 AM    CO2  33 (H) 08/21/2021 04:14 AM    GAP 9 08/21/2021 04:14 AM    BUN 39 (H) 08/21/2021 04:14 AM    CR 1.66 (H) 08/21/2021 04:14 AM    GLU 99 08/21/2021 04:14 AM    CA 9.3 08/21/2021 04:14 AM    PO4 2.5 08/18/2021 07:07 PM    ALBUMIN 3.5 08/18/2021 05:00 PM    TOTPROT 6.7 08/18/2021 05:00 PM    ALKPHOS 70 08/18/2021 05:00 PM    AST 22 08/18/2021 05:00 PM    ALT 9 08/18/2021 05:00 PM    TOTBILI 0.7 08/18/2021 05:00 PM    GFR 53 05/13/2021 12:00 AM    GFRAA >60 12/03/2019 04:51 AM    PSA 2.96 11/25/2018 12:00 AM              Assessment and Plan:    1. Cellulitis of right lower extremity    2. 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 Acute cystitis without hematuria    4. Pressure injury of sacral region, unstageable (HCC)      [name] is here today with cellulitis of the right lower extremity as well as osteomyelitis.  I am going to admit him as an inpatient for IV antibiotic therapy and so that we can get infectious disease consultation in addition to orthopedics.  He is nonseptic appearing.  I am going to send down a CBC, CMP, and lactate level.  I will also collect blood cultures.    He is also concerned about a UTI, so we will check a urine here in the clinic.  He has 3+ leuks. We sent down a urine culture.     As for the sacral ulcer, this is difficult for me to fully assess in the clinic.  We will need to get a wound consult while inpatient.  There is a possibility of overlying infection of this ulcer, but we can assess this further once we get him a bed in the hospital.    Total of 40 minutes were spent on the same day of the visit including preparing to see the patient, obtaining and/or reviewing separately obtained history, performing a medically appropriate examination and/or evaluation, counseling and educating the patient/family/caregiver, ordering medications, tests, or procedures, referring and communication with other health care professionals, documenting clinical information in the electronic or other health record, independently interpreting results and communicating results to the patient/family/caregiver, and care coordination.         There are no Patient Instructions on file for this visit.    No follow-ups on file.

## 2021-09-27 ENCOUNTER — Inpatient Hospital Stay: Admit: 2021-09-27 | Discharge: 2021-09-27 | Payer: MEDICARE

## 2021-09-27 ENCOUNTER — Encounter: Admit: 2021-09-27 | Discharge: 2021-09-27 | Payer: MEDICARE

## 2021-09-27 ENCOUNTER — Inpatient Hospital Stay: Admit: 2021-09-27 | Discharge: 2021-09-26 | Payer: MEDICARE

## 2021-09-27 DIAGNOSIS — Z8614 Personal history of Methicillin resistant Staphylococcus aureus infection: Secondary | ICD-10-CM

## 2021-09-27 DIAGNOSIS — J449 Chronic obstructive pulmonary disease, unspecified: Secondary | ICD-10-CM

## 2021-09-27 DIAGNOSIS — N401 Enlarged prostate with lower urinary tract symptoms: Secondary | ICD-10-CM

## 2021-09-27 DIAGNOSIS — G4733 Obstructive sleep apnea (adult) (pediatric): Secondary | ICD-10-CM

## 2021-09-27 DIAGNOSIS — J302 Other seasonal allergic rhinitis: Secondary | ICD-10-CM

## 2021-09-27 DIAGNOSIS — R0600 Dyspnea, unspecified: Secondary | ICD-10-CM

## 2021-09-27 DIAGNOSIS — J45909 Unspecified asthma, uncomplicated: Secondary | ICD-10-CM

## 2021-09-27 DIAGNOSIS — M961 Postlaminectomy syndrome, not elsewhere classified: Secondary | ICD-10-CM

## 2021-09-27 DIAGNOSIS — Z8679 Personal history of other diseases of the circulatory system: Secondary | ICD-10-CM

## 2021-09-27 DIAGNOSIS — U071 Pneumonia due to COVID-19 virus: Secondary | ICD-10-CM

## 2021-09-27 DIAGNOSIS — K219 Gastro-esophageal reflux disease without esophagitis: Secondary | ICD-10-CM

## 2021-09-27 DIAGNOSIS — Z9981 Dependence on supplemental oxygen: Secondary | ICD-10-CM

## 2021-09-27 DIAGNOSIS — I10 Essential (primary) hypertension: Secondary | ICD-10-CM

## 2021-09-27 DIAGNOSIS — R609 Edema, unspecified: Secondary | ICD-10-CM

## 2021-09-27 DIAGNOSIS — I4891 Unspecified atrial fibrillation: Secondary | ICD-10-CM

## 2021-09-27 DIAGNOSIS — L03116 Cellulitis of left lower limb: Secondary | ICD-10-CM

## 2021-09-27 DIAGNOSIS — Z95828 Presence of other vascular implants and grafts: Secondary | ICD-10-CM

## 2021-09-27 DIAGNOSIS — R053 Chronic cough: Secondary | ICD-10-CM

## 2021-09-27 DIAGNOSIS — E119 Type 2 diabetes mellitus without complications: Secondary | ICD-10-CM

## 2021-09-27 DIAGNOSIS — J189 Pneumonia, unspecified organism: Secondary | ICD-10-CM

## 2021-09-27 DIAGNOSIS — I509 Heart failure, unspecified: Secondary | ICD-10-CM

## 2021-09-27 DIAGNOSIS — J479 Bronchiectasis, uncomplicated: Secondary | ICD-10-CM

## 2021-09-27 DIAGNOSIS — I5042 Chronic combined systolic (congestive) and diastolic (congestive) heart failure: Secondary | ICD-10-CM

## 2021-09-27 DIAGNOSIS — I429 Cardiomyopathy, unspecified: Secondary | ICD-10-CM

## 2021-09-27 DIAGNOSIS — I2510 Atherosclerotic heart disease of native coronary artery without angina pectoris: Secondary | ICD-10-CM

## 2021-09-27 DIAGNOSIS — N183 CKD (chronic kidney disease) stage 3, GFR 30-59 ml/min (HCC): Secondary | ICD-10-CM

## 2021-09-27 DIAGNOSIS — E782 Mixed hyperlipidemia: Secondary | ICD-10-CM

## 2021-09-27 DIAGNOSIS — D509 Iron deficiency anemia, unspecified: Secondary | ICD-10-CM

## 2021-09-27 DIAGNOSIS — M954 Acquired deformity of chest and rib: Secondary | ICD-10-CM

## 2021-09-27 DIAGNOSIS — I714 AAA (abdominal aortic aneurysm) (HCC): Secondary | ICD-10-CM

## 2021-09-27 MED ADMIN — CALCIUM CARBONATE 200 MG CALCIUM (500 MG) PO CHEW [9385]: 500 mg | ORAL | @ 16:00:00 | NDC 66553000401

## 2021-09-27 MED ADMIN — BUMETANIDE 1 MG PO TAB [9310]: 4 mg | ORAL | @ 16:00:00 | Stop: 2021-09-27 | NDC 00904701606

## 2021-09-27 MED ADMIN — CYANOCOBALAMIN (VITAMIN B-12) 1,000 MCG/ML IJ SOLN [2007]: 1000 ug | INTRAMUSCULAR | @ 20:00:00 | Stop: 2021-10-04 | NDC 69680011201

## 2021-09-27 MED ADMIN — BUMETANIDE 1 MG PO TAB [9310]: 4 mg | ORAL | @ 20:00:00 | Stop: 2021-09-27 | NDC 00904701606

## 2021-09-27 MED ADMIN — MAGNESIUM SULFATE IN D5W 1 GRAM/100 ML IV PGBK [166578]: 1 g | INTRAVENOUS | @ 13:00:00 | Stop: 2021-09-27 | NDC 44567041024

## 2021-09-27 MED ADMIN — MAGNESIUM SULFATE IN D5W 1 GRAM/100 ML IV PGBK [166578]: 1 g | INTRAVENOUS | @ 20:00:00 | Stop: 2021-09-28 | NDC 44567041024

## 2021-09-27 MED ADMIN — FOLIC ACID 1 MG PO TAB [3233]: 1 mg | ORAL | @ 16:00:00 | NDC 00904722461

## 2021-09-27 MED FILL — SODIUM CHLORIDE 3.5 % IN NEBU: 3.5 % | RESPIRATORY_TRACT | 30 days supply | Qty: 240 | Fill #6 | Status: CP

## 2021-09-27 NOTE — Progress Notes
VAT consulted for PIV. Patient with single lumen PICC in LUE placed by outside facility. Per consult details, PICC "not flushing." Upon assessment, PICC site appears clean and dry without signs of infection. CHG disc in place, no stabilization device observed. PICC flushed with 20 ml NS, negative suction with aspiration. Would reccommend Alteplase (x2 if unsuccessful establishing blood return with one dose) pending chest x-ray results which will confirm PICC tip position. Do not use PICC until tip position confirmed and blood return established. Re-consult VAT for additional assistance as needed.

## 2021-09-28 ENCOUNTER — Encounter: Admit: 2021-09-28 | Discharge: 2021-09-28 | Payer: MEDICARE

## 2021-09-28 MED ADMIN — MAGNESIUM SULFATE IN D5W 1 GRAM/100 ML IV PGBK [166578]: 1 g | INTRAVENOUS | @ 17:00:00 | Stop: 2021-09-28 | NDC 44567041024

## 2021-09-28 MED ADMIN — CYANOCOBALAMIN (VITAMIN B-12) 1,000 MCG/ML IJ SOLN [2007]: 1000 ug | INTRAMUSCULAR | @ 13:00:00 | Stop: 2021-09-29 | NDC 69680011201

## 2021-09-28 MED ADMIN — FOLIC ACID 1 MG PO TAB [3233]: 1 mg | ORAL | @ 13:00:00 | Stop: 2021-09-29 | NDC 00904722461

## 2021-09-28 MED ADMIN — B COMP NO3-FOLIC-C-BIOTIN-ZINC 1-60-300-12.5 MG-MG-MCG-MG PO TAB [173534]: 1 | ORAL | @ 13:00:00 | Stop: 2021-09-29 | NDC 59528031701

## 2021-09-28 MED ADMIN — BUMETANIDE 1 MG PO TAB [9310]: 3 mg | ORAL | @ 21:00:00 | Stop: 2021-09-29 | NDC 00904701606

## 2021-09-28 MED ADMIN — POTASSIUM CHLORIDE 20 MEQ PO TBTQ [35943]: 40 meq | ORAL | @ 20:00:00 | Stop: 2021-09-28 | NDC 00832532511

## 2021-09-28 MED ADMIN — BUMETANIDE 1 MG PO TAB [9310]: 3 mg | ORAL | @ 11:00:00 | Stop: 2021-09-29 | NDC 00904701606

## 2021-09-28 MED ADMIN — B COMP NO3-FOLIC-C-BIOTIN-ZINC 1-60-300-12.5 MG-MG-MCG-MG PO TAB [173534]: 1 | ORAL | @ 01:00:00 | NDC 59528031701

## 2021-09-29 ENCOUNTER — Encounter: Admit: 2021-09-29 | Discharge: 2021-09-29 | Payer: MEDICARE

## 2021-09-29 ENCOUNTER — Inpatient Hospital Stay: Payer: MEDICARE

## 2021-09-29 NOTE — Telephone Encounter
Hospital Discharge Follow Up      Reached Patient: Yes, patient was identified, for their safety, using dual identification of name and date of birth verified by spouse [name]  Patient Date of Birth: [birth year]     Admission Information:     Hospital Name: [facility] (includes main campus, [place], [place], [place], [place])  Admission Date: 09/26/21    Discharge Date: 09/28/21  Admission Diagnosis: Cellulitis  Discharge Diagnosis: Atrial fibrillation (HCC), Benign prostatic hyperplasia, CAD (coronary artery disease), native coronary artery, Cellulitis, Cellulitis of right lower extremity, Chronic bronchitis (HCC), Chronic combined systolic (congestive) and diastolic (congestive) heart failure (HCC), 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 Yes  If yes, reason: HFrEF, Acute on chronic systolic congestive heart failure , Chronic respiratory failure with hypoxia, Osteomyelitis of femur, unspecified laterality, unspecified type   Hospital Services: Unplanned  Today's call is 1 (business) days post discharge      Discharge Instruction Review   Did patient receive and understand discharge instructions? Yes    Home Health ordered? No                 Agency name/telephone number: NA   Has Home Health agency contacted patient? NA   Caregiver assistance in the home? Yes   Are there concerns regarding the patient's ADL'S? NA  Is patient a fall risk? NA    Special diet? Yes, Fluid Restriction - Limit the amount of your daily fluids to (milliliters). This is equal to about 8 cups a day..  Low Sodium - You will need to monitor the amount of sodium in your diet. Do not eat more than 2g (grams) or 2000mg       Medication Reconciliation    Changes to pre-hospital medications? Yes     New Medications:  naloxone (NARCAN) mg/actuation nasal spray  Insert 1 spray into 1 nostril as needed for signs of opioid overdose then call 911. May repeat dose every 2-3 minutes (alternate nostrils) until medical team arrives. Indications: decrease in rate & depth of breathing due to opioid drug, opioid overdose    Modified Medications:  clopiDOGreL (PLAVIX) 75 mg tablet  Take one tablet by mouth daily. HOLD UNTIL AFTER BONE BIOPSY 10/03/21      Were new prescriptions filled? Yes  Meds reviewed and reconciled? Yes    Current Outpatient Medications   Medication Instructions   ? albuterol 0.083% (PROVENTIL) 2.5 mg /3 mL (0.083 %) nebulizer solution Inhale 3 mL solution by nebulizer as directed every 6 hours as needed for Wheezing or Shortness of Breath. Indications: asthma   ? allopurinoL (ZYLOPRIM) 300 mg, Oral, DAILY, take with food   ? amiodarone (PACERONE) 400 mg tablet Take 400 mg BID until follow up with EP   ? amitr-gabapen-emu oil 05-24-08% topical cream (BATCHED COMPOUND) Apply to affected areas twice daily as needed.   ? arformoteroL (BROVANA) 15 mcg/2 mL nebulizer solution Inhale 2 mL solution by nebulizer as directed twice daily. Dx: J44.9   ? ascorbic acid (vitamin C) (ASCORBIC ACID (VITAMIN C)) 500 mg, Oral,  DAILY   ? aspirin EC (ASPIR-LOW) 81 mg, Oral, DAILY, Take with food.   ? atorvastatin (LIPITOR) 40 mg tablet Take 1 tablet by mouth once daily   ? azelastine (ASTELIN) 137 mcg (0.1 %) nasal spray 2 sprays, Each Nostril, TWICE DAILY, Use in each nostril as directed   ? budesonide (PULMICORT) 0.25 mg/2 mL nebulizer solution Inhale 2 mL solution by nebulizer as directed twice daily. Dx: J44.9   ? bumetanide (BUMEX) 1 mg tablet Take with 2 mg tablet twice daily to make a total of 3 mg.   ? bumetanide (BUMEX) 2 mg tablet Take one tablet twice daily. Take along with 1 mg tablet for a total of 3 mg twice daily.   ? CHOLEcalciferoL (vitamin D3) 1,000 Units, Oral, DAILY   ? clopiDOGreL (PLAVIX) 75 mg, Oral, DAILY, HOLD UNTIL AFTER BONE BIOPSY 10/03/21   ? diclofenac sodium (VOLTAREN) 4 g, Topical, FOUR TIMES DAILY PRN   ? ferrous gluconate 324 mg, Oral, EVERY 48 HOURS   ? finasteride (PROSCAR) 5 mg, Oral, DAILY   ? fish oil- omega 3-DHA/EPA 300/1,000 mg capsule 1 capsule, Oral, DAILY   ? flash glucose scanning reader (FREESTYLE LIBRE) reader Use as directed.   ? flash glucose sensor (FREESTYLE LIBRE 14 DAY SENSOR) sensor Type 2 diabetes   ? gabapentin (NEURONTIN) 400 mg, Oral, TWICE DAILY   ? guaiFENesin (ROBITUSSIN) 200 mg, Oral, EVERY  4 HOURS PRN   ? HYDROcodone/acetaminophen (NORCO) 5/325 mg tablet 1 tablet, Oral, EVERY  6 HOURS PRN   ? insulin pen needles (disposable) (BD ULTRA-FINE MINI PEN NEEDLE) 31 gauge x 3/16 pen needle Use with insulin pens   ? magnesium oxide 400 mg, Oral, TWICE DAILY   ? mepolizumab (NUCALA) 100 mg/mL injection pen Inject 100 mg under the skin every 28 days.   ? metOLazone (ZAROXOLYN) 2.5 mg, Oral, DAILY  PRN, PER OUTPATIENT CARDIOLOGY PROVIDER AS NEEDED FOR FLUID RETENTION   ? metoprolol succinate XL (TOPROL XL) 12.5 mg, Oral, DAILY   ? mexiletine (MEXITIL) 150 mg capsule Take 150 mg three times daily until follow up with EP   ? Miscellaneous Medical Supply misc Rx: Please wrap legs with ACE bandage from toes as high up to the leg as possible bilaterally<BR>Dx: Lymphedema   ? Miscellaneous Medical Supply misc itted for LE gradual compression stockings at a medical supply store   ? montelukast (SINGULAIR) 10 mg, Oral, AT BEDTIME DAILY   ? multivit-iron-FA-calcium-mins (THERA-M) 9 mg iron-400 mcg tablet 1 tablet, Oral, DAILY   ? naloxone (NARCAN) 4 mg/actuation nasal spray Insert 1 spray into 1 nostril as needed for signs of opioid overdose then call 911. May repeat dose every 2-3 minutes (alternate nostrils) until medical team arrives.   ? nitroglycerin (NITROSTAT) 0.4 mg, Sublingual, EVERY  5 MIN PRN, Max of 3 tablets, call 911.    ? pantoprazole DR (PROTONIX) 40 mg, Oral, TWICE DAILY   ? polyethylene glycol 3350 (MIRALAX) 17 g, Oral, DAILY  PRN   ? potassium chloride (KLOR-CON M10) 10 mEq tablet 20 mEq, Oral, DAILY, Take with a meal and a full glass of water.   ? sodium chloride (HYPER-SAL) 3.5 % inhalation solution 4 mL, Inhalation, TWICE DAILY, Dx: J47.9   ? sodium chloride/aloe vera (AYR SALINE GEL NA) Nasal, AS NEEDED   ? spironolactone (ALDACTONE) 12.5 mg, Oral, DAILY, Take with food.   ? tamsulosin (FLOMAX) 0.4 mg capsule TAKE 1 CAPSULE BY MOUTH ONCE DAILY (DO  NOT  CRUSH,  CHEW  OR  OPEN  CAPSULES  (TAKE  30  MINUTES  FOLLOWING  THE  SAME  MEAL  EACH  DAY).   ? traMADoL (ULTRAM) 50 mg, Oral, EVERY  6 HOURS PRN   ? TRULICITY 0.75 mg/0.5 mL injection pen INJECT 0.5 ML UNDER THE SKIN EVERY 7 DAYS FOR TYPE 2 DIABETES.   ? venlafaxine XR (EFFEXOR XR) 75 mg, Oral, DAILY, Take with food.         Understanding Condition   Having any current symptoms? Yes, Spoke with [name] who expressed her frusteration with the hospital stay. Offered to provide patient advocate [name] declined stating she is he patient advocate and was not interested in calling them again when she had in the past she was told she was a liar so she is not interested in speaking with them again. Ed was resting and per [name] is doing better mentally while inpatient there seemed to be confusion and people getting involoved that didn't need to get involved per [name] so his being home now is better for him mentally. She was not interested in going over discharge information. Attempted to see if there was an earlier appt. from the one scheduled in Nov. offered for them to be placed on the wait list if there was a cancellation [name] was agreeable to that.  Do you have a history of Heart Failure? Yes, What time do you weigh yourself daily? Not able to ask any HF questions   What was your weight today? NA  Have you experienced weight gain since you were discharged from the hospital? NA  Have you had an increase in swelling since discharge?NA    Have you had an increase in shortness of air since discharge? NA  Have you had an increase in fatigue since discharge? NA  Have you had an increase in abdominal bloating/tightness since discharge? NA  Do you have a zone sheet? NA    Please call your doctor if you experience weight gain of 3 lbs in one day or 5 lbs in one week.  Patient understands when to seek additional medical care? Yes   Other items discussed:     Additional Discharge Instructions:  You will need to be re-admitted to the hospital on Sunday October 02, 2021 for your bone biopsy and for  coordination with the infectious disease team. Please arrive and go to the admitting office in the main lobby  on Sunday afternoon.     Scheduling Follow-up Appointment   Upcoming appointments:   Future Appointments   Date Time Provider Department Center   10/03/2021  2:15 PM IR ROOM 5 IR Interv Radio   10/06/2021  2:15 PM [name], Lenice Llamas, PA-C [facility] Urology   10/13/2021  2:40 PM Joanie Coddington, MD [id number] SPINE   10/26/2021  9:00 AM PF LAB SCHEDULE C [id number] None   10/26/2021 10:20 AM Deveron Furlong, MD MPAPULM IM   10/31/2021 12:00 AM MAC REMOTE MONITORING MACREMOTEHRM CVM Procedur   11/29/2021  1:30 PM Fredricka Bonine, APRN-NP CVMSNCL CVM Exam   12/01/2021 12:00 AM MAC REMOTE MONITORING MACREMOTEHRM CVM Procedur   01/02/2022 12:00 AM MAC REMOTE MONITORING MACREMOTEHRM CVM Procedur   01/16/2022 11:20 AM Comfort, [name], MD [facility] IM   01/24/2022  1:45 PM Donnelly Stager, MD [facility] CVM Exam   02/02/2022 12:00 AM MAC REMOTE MONITORING MACREMOTEHRM CVM Procedur   03/06/2022 12:00 AM MAC REMOTE MONITORING MACREMOTEHRM CVM Procedur   04/06/2022 12:00 AM  MAC REMOTE MONITORING MACREMOTEHRM CVM Procedur   05/08/2022 12:00 AM MAC REMOTE MONITORING MACREMOTEHRM CVM Procedur   06/08/2022 12:00 AM MAC REMOTE MONITORING MACREMOTEHRM CVM Procedur     Does the patient require 7 day follow up appointment? No  Hospital Follow-Up scheduled with PCP? Yes, Date: 11/27   When was patient?s last PCP visit: 09/26/2021   PCP primary location: [facility]  Specialist appointment scheduled? Yes, with Urology 8/17  Is assistance with transportation needed? No   MyChart message sent? Active in MyChart. No message sent.   Artera text sent? No    Lowry Bowl, RN

## 2021-09-30 ENCOUNTER — Encounter: Admit: 2021-09-30 | Discharge: 2021-09-30 | Payer: MEDICARE

## 2021-09-30 MED FILL — NALOXONE 4 MG/ACTUATION NA SPRY: 4 mg/actuation | 1 days supply | Qty: 2 | Fill #1 | Status: AC

## 2021-09-30 NOTE — Telephone Encounter
Received call overnight that one blood culture bottle growing staph.

## 2021-10-02 ENCOUNTER — Encounter: Admit: 2021-10-02 | Discharge: 2021-10-02 | Payer: MEDICARE

## 2021-10-02 ENCOUNTER — Inpatient Hospital Stay: Admit: 2021-10-02 | Discharge: 2021-10-02 | Payer: MEDICARE

## 2021-10-03 ENCOUNTER — Encounter: Admit: 2021-10-03 | Discharge: 2021-10-03 | Payer: MEDICARE

## 2021-10-03 ENCOUNTER — Ambulatory Visit: Admit: 2021-10-03 | Discharge: 2021-10-03 | Payer: MEDICARE

## 2021-10-03 ENCOUNTER — Inpatient Hospital Stay: Admit: 2021-10-03 | Discharge: 2021-10-03 | Payer: MEDICARE

## 2021-10-03 MED ORDER — SODIUM CHLORIDE 0.9 % IV SOLP
INTRAVENOUS | 0 refills | Status: DC
Start: 2021-10-03 — End: 2021-10-03
  Administered 2021-10-03: 21:00:00 via INTRAVENOUS

## 2021-10-03 MED ORDER — FENTANYL CITRATE (PF) 50 MCG/ML IJ SOLN
INTRAVENOUS | 0 refills | Status: DC
Start: 2021-10-03 — End: 2021-10-03
  Administered 2021-10-03 (×2): 25 ug via INTRAVENOUS

## 2021-10-03 MED ORDER — PROPOFOL INJ 10 MG/ML IV VIAL
INTRAVENOUS | 0 refills | Status: DC
Start: 2021-10-03 — End: 2021-10-03
  Administered 2021-10-03 (×4): 20 mg via INTRAVENOUS

## 2021-10-03 MED ADMIN — ASCORBIC ACID (VITAMIN C) 500 MG PO TAB [664]: 500 mg | ORAL | @ 15:00:00 | NDC 68094011359

## 2021-10-03 MED ADMIN — FERROUS GLUCONATE 324 MG (37.5 MG IRON) PO TAB [308378]: 324 mg | ORAL | @ 02:00:00 | NDC 79854050032

## 2021-10-03 MED ADMIN — CALCIUM CARBONATE 200 MG CALCIUM (500 MG) PO CHEW [9385]: 500 mg | ORAL | @ 04:00:00 | NDC 66553000401

## 2021-10-03 MED ADMIN — BUMETANIDE 0.5 MG PO TAB [9309]: 3 mg | ORAL | @ 15:00:00 | NDC 69238148901

## 2021-10-03 MED ADMIN — AMIODARONE 200 MG PO TAB [9066]: 400 mg | ORAL | @ 02:00:00 | NDC 00904699361

## 2021-10-03 MED ADMIN — METOPROLOL SUCCINATE 25 MG PO TB24 [81866]: 12.5 mg | ORAL | @ 15:00:00 | NDC 00904632261

## 2021-10-03 MED ADMIN — MELATONIN 5 MG PO TAB [168576]: 5 mg | ORAL | @ 04:00:00 | NDC 77333052025

## 2021-10-03 MED ADMIN — MEXILETINE 150 MG PO CAP [10595]: 150 mg | ORAL | @ 15:00:00 | NDC 00093873901

## 2021-10-03 MED ADMIN — PANTOPRAZOLE 40 MG PO TBEC [80436]: 40 mg | ORAL | @ 15:00:00 | NDC 00904647461

## 2021-10-03 MED ADMIN — MONTELUKAST 10 MG PO TAB [81627]: 10 mg | ORAL | @ 02:00:00 | NDC 00904680861

## 2021-10-03 MED ADMIN — ALLOPURINOL 100 MG PO TAB [310]: 300 mg | ORAL | @ 15:00:00 | NDC 00904704161

## 2021-10-03 MED ADMIN — MAGNESIUM SULFATE IN D5W 1 GRAM/100 ML IV PGBK [166578]: 1 g | INTRAVENOUS | @ 15:00:00 | Stop: 2021-10-03 | NDC 63323010800

## 2021-10-03 MED ADMIN — ALBUTEROL SULFATE 2.5 MG/0.5 ML IN NEBU [93139]: 2.5 mg | RESPIRATORY_TRACT | @ 14:00:00 | NDC 00487990130

## 2021-10-03 MED ADMIN — SODIUM CHLORIDE 0.9 % IV SOLP [27838]: 250 mL | INTRAVENOUS | @ 15:00:00 | Stop: 2021-10-03 | NDC 00338004902

## 2021-10-03 MED ADMIN — MAGNESIUM OXIDE 400 MG (241.3 MG MAGNESIUM) PO TAB [10491]: 400 mg | ORAL | @ 02:00:00 | NDC 64980033912

## 2021-10-03 MED ADMIN — CEFTRIAXONE INJ 1GM IVP [210253]: 2 g | INTRAVENOUS | NDC 00409733211

## 2021-10-03 MED ADMIN — SODIUM CHLORIDE 3 % IN NEBU [7327]: 4 mL | RESPIRATORY_TRACT | @ 14:00:00 | NDC 00487900360

## 2021-10-03 MED ADMIN — FINASTERIDE 5 MG PO TAB [82679]: 5 mg | ORAL | @ 15:00:00 | NDC 00904683061

## 2021-10-03 MED ADMIN — ARFORMOTEROL 15 MCG/2 ML IN NEBU [135894]: 15 ug | RESPIRATORY_TRACT | @ 03:00:00

## 2021-10-03 MED ADMIN — GABAPENTIN 400 MG PO CAP [18307]: 400 mg | ORAL | @ 15:00:00 | NDC 00904666761

## 2021-10-03 MED ADMIN — SENNOSIDES-DOCUSATE SODIUM 8.6-50 MG PO TAB [40926]: 1 | ORAL | @ 15:00:00 | NDC 00536124801

## 2021-10-03 MED ADMIN — ALBUTEROL SULFATE 2.5 MG /3 ML (0.083 %) IN NEBU [250]: 2.5 mg | RESPIRATORY_TRACT | @ 03:00:00 | Stop: 2021-10-03 | NDC 60687039579

## 2021-10-03 MED ADMIN — DAPTOMYCIN SYR [212028]: 700 mg | INTRAVENOUS | NDC 63323087115

## 2021-10-03 MED ADMIN — PANTOPRAZOLE 40 MG PO TBEC [80436]: 40 mg | ORAL | @ 02:00:00 | NDC 00904647461

## 2021-10-03 MED ADMIN — VENLAFAXINE 75 MG PO CP24 [77315]: 75 mg | ORAL | @ 15:00:00 | NDC 00904707761

## 2021-10-03 MED ADMIN — BUMETANIDE 2 MG PO TAB [9311]: 3 mg | ORAL | @ 15:00:00 | NDC 50268013211

## 2021-10-03 MED ADMIN — OMEGA 3-DHA-EPA-FISH OIL 300-1,000 MG PO CPDR [306443]: 1000 mg | ORAL | @ 15:00:00 | NDC 79854006900

## 2021-10-03 MED ADMIN — MEXILETINE 150 MG PO CAP [10595]: 150 mg | ORAL | @ 03:00:00 | NDC 00093873901

## 2021-10-03 MED ADMIN — MAGNESIUM OXIDE 400 MG (241.3 MG MAGNESIUM) PO TAB [10491]: 400 mg | ORAL | @ 15:00:00 | NDC 64980033912

## 2021-10-03 MED ADMIN — CHOLECALCIFEROL (VITAMIN D3) 25 MCG (1,000 UNIT) PO TAB [130074]: 1000 [IU] | ORAL | @ 15:00:00 | NDC 80681016900

## 2021-10-03 MED ADMIN — AMIODARONE 200 MG PO TAB [9066]: 400 mg | ORAL | @ 15:00:00 | NDC 00904699361

## 2021-10-03 MED ADMIN — ASPIRIN 81 MG PO CHEW [680]: 81 mg | ORAL | @ 18:00:00 | Stop: 2021-10-03 | NDC 00904679480

## 2021-10-03 MED ADMIN — ATORVASTATIN 40 MG PO TAB [77113]: 40 mg | ORAL | @ 15:00:00 | NDC 00904629261

## 2021-10-03 MED ADMIN — GABAPENTIN 400 MG PO CAP [18307]: 400 mg | ORAL | @ 02:00:00 | NDC 00904666761

## 2021-10-03 MED ADMIN — TAMSULOSIN 0.4 MG PO CAP [80077]: 0.4 mg | ORAL | @ 15:00:00 | NDC 00904640161

## 2021-10-04 MED ADMIN — METOPROLOL SUCCINATE 25 MG PO TB24 [81866]: 12.5 mg | ORAL | @ 14:00:00 | NDC 00904632261

## 2021-10-04 MED ADMIN — MONTELUKAST 10 MG PO TAB [81627]: 10 mg | ORAL | @ 02:00:00 | NDC 00904680861

## 2021-10-04 MED ADMIN — TAMSULOSIN 0.4 MG PO CAP [80077]: 0.4 mg | ORAL | @ 14:00:00 | NDC 00904640161

## 2021-10-04 MED ADMIN — ATORVASTATIN 40 MG PO TAB [77113]: 40 mg | ORAL | @ 14:00:00 | NDC 00904629261

## 2021-10-04 MED ADMIN — ALBUTEROL SULFATE 2.5 MG/0.5 ML IN NEBU [93139]: 2.5 mg | RESPIRATORY_TRACT | @ 14:00:00 | NDC 00487990130

## 2021-10-04 MED ADMIN — OMEGA 3-DHA-EPA-FISH OIL 300-1,000 MG PO CPDR [306443]: 1000 mg | ORAL | @ 14:00:00 | NDC 79854006900

## 2021-10-04 MED ADMIN — MAGNESIUM OXIDE 400 MG (241.3 MG MAGNESIUM) PO TAB [10491]: 400 mg | ORAL | @ 14:00:00 | NDC 64980033912

## 2021-10-04 MED ADMIN — CEFEPIME 2 GRAM IJ SOLR [78195]: 2 g | INTRAVENOUS | @ 14:00:00 | NDC 60505614700

## 2021-10-04 MED ADMIN — MEXILETINE 150 MG PO CAP [10595]: 150 mg | ORAL | @ 14:00:00 | NDC 00093873901

## 2021-10-04 MED ADMIN — ARFORMOTEROL 15 MCG/2 ML IN NEBU [135894]: 15 ug | RESPIRATORY_TRACT | @ 14:00:00

## 2021-10-04 MED ADMIN — FINASTERIDE 5 MG PO TAB [82679]: 5 mg | ORAL | @ 14:00:00 | NDC 00904683061

## 2021-10-04 MED ADMIN — MEXILETINE 150 MG PO CAP [10595]: 150 mg | ORAL | @ 02:00:00 | NDC 00093873901

## 2021-10-04 MED ADMIN — CLOPIDOGREL 75 MG PO TAB [78966]: 75 mg | ORAL | @ 14:00:00 | NDC 00904629461

## 2021-10-04 MED ADMIN — ASCORBIC ACID (VITAMIN C) 500 MG PO TAB [664]: 500 mg | ORAL | @ 14:00:00 | NDC 68094011359

## 2021-10-04 MED ADMIN — AMIODARONE 200 MG PO TAB [9066]: 400 mg | ORAL | @ 14:00:00 | NDC 00904699361

## 2021-10-04 MED ADMIN — POTASSIUM CHLORIDE 20 MEQ PO TBTQ [35943]: 40 meq | ORAL | @ 14:00:00 | Stop: 2021-10-04 | NDC 00832532511

## 2021-10-04 MED ADMIN — SODIUM CHLORIDE 0.9 % IV PGBK (MB+) [95161]: 2 g | INTRAVENOUS | @ 14:00:00 | NDC 00338915930

## 2021-10-04 MED ADMIN — BUMETANIDE 0.5 MG PO TAB [9309]: 3 mg | ORAL | @ 14:00:00 | NDC 69238148901

## 2021-10-04 MED ADMIN — MAGNESIUM OXIDE 400 MG (241.3 MG MAGNESIUM) PO TAB [10491]: 400 mg | ORAL | @ 02:00:00 | NDC 64980033912

## 2021-10-04 MED ADMIN — MAGNESIUM SULFATE IN D5W 1 GRAM/100 ML IV PGBK [166578]: 1 g | INTRAVENOUS | @ 19:00:00 | Stop: 2021-10-04 | NDC 63323010800

## 2021-10-04 MED ADMIN — ALBUTEROL SULFATE 2.5 MG/0.5 ML IN NEBU [93139]: 2.5 mg | RESPIRATORY_TRACT | @ 01:00:00 | NDC 00487990130

## 2021-10-04 MED ADMIN — BUDESONIDE 0.25 MG/2 ML IN NBSP [81321]: 0.25 mg | RESPIRATORY_TRACT | @ 01:00:00 | NDC 00487960101

## 2021-10-04 MED ADMIN — DAPTOMYCIN SYR [212028]: 700 mg | INTRAVENOUS | @ 22:00:00 | NDC 63323087115

## 2021-10-04 MED ADMIN — BUMETANIDE 2 MG PO TAB [9311]: 3 mg | ORAL | @ 14:00:00 | NDC 50268013211

## 2021-10-04 MED ADMIN — ASPIRIN 81 MG PO CHEW [680]: 81 mg | ORAL | @ 14:00:00 | NDC 00904679480

## 2021-10-04 MED ADMIN — SODIUM CHLORIDE 3 % IN NEBU [7327]: 4 mL | RESPIRATORY_TRACT | @ 14:00:00 | NDC 00487900360

## 2021-10-04 MED ADMIN — B COMP NO3-FOLIC-C-BIOTIN-ZINC 1-60-300-12.5 MG-MG-MCG-MG PO TAB [173534]: 1 | ORAL | @ 22:00:00 | NDC 59528031701

## 2021-10-04 MED ADMIN — SODIUM CHLORIDE 3 % IN NEBU [7327]: 4 mL | RESPIRATORY_TRACT | @ 01:00:00 | NDC 00487900360

## 2021-10-04 MED ADMIN — CLOPIDOGREL 75 MG PO TAB [78966]: 75 mg | ORAL | @ 04:00:00 | NDC 00904629461

## 2021-10-04 MED ADMIN — ALLOPURINOL 100 MG PO TAB [310]: 300 mg | ORAL | @ 14:00:00 | NDC 00904704161

## 2021-10-04 MED ADMIN — AMIODARONE 200 MG PO TAB [9066]: 400 mg | ORAL | @ 02:00:00 | NDC 00904699361

## 2021-10-04 MED ADMIN — VENLAFAXINE 75 MG PO CP24 [77315]: 75 mg | ORAL | @ 14:00:00 | NDC 00904707761

## 2021-10-04 MED ADMIN — BUDESONIDE 0.25 MG/2 ML IN NBSP [81321]: 0.25 mg | RESPIRATORY_TRACT | @ 14:00:00 | NDC 00487960101

## 2021-10-04 MED ADMIN — BUMETANIDE 0.5 MG PO TAB [9309]: 3 mg | ORAL | @ 22:00:00 | NDC 69238148901

## 2021-10-04 MED ADMIN — BUMETANIDE 2 MG PO TAB [9311]: 3 mg | ORAL | @ 22:00:00 | NDC 50268013211

## 2021-10-04 MED ADMIN — GABAPENTIN 400 MG PO CAP [18307]: 400 mg | ORAL | @ 22:00:00 | NDC 00904666761

## 2021-10-04 MED ADMIN — ARFORMOTEROL 15 MCG/2 ML IN NEBU [135894]: 15 ug | RESPIRATORY_TRACT | @ 01:00:00

## 2021-10-04 MED ADMIN — MAGNESIUM SULFATE IN D5W 1 GRAM/100 ML IV PGBK [166578]: 1 g | INTRAVENOUS | @ 15:00:00 | Stop: 2021-10-04 | NDC 63323010800

## 2021-10-04 MED ADMIN — CHOLECALCIFEROL (VITAMIN D3) 25 MCG (1,000 UNIT) PO TAB [130074]: 1000 [IU] | ORAL | @ 14:00:00 | NDC 80681016900

## 2021-10-04 MED ADMIN — PANTOPRAZOLE 40 MG PO TBEC [80436]: 40 mg | ORAL | @ 14:00:00 | NDC 00904647461

## 2021-10-04 NOTE — Anesthesia Post-Procedure Evaluation
Post-Anesthesia Evaluation    Name: Ruben Wood.      MRN: [id number]     DOB: April 30, 1940     Age: 81 y.o.     Sex: male   __________________________________________________________________________     Procedure Information     Anesthesia Start Date/Time: 10/03/21 1621    Scheduled providers: Shauna Hugh, RN; Marin Olp, Albertina Senegal, MD    Procedure: CT GUIDE NEEDLE PLACEMENT    Location: Interventional Radiology: [facility]          Post-Anesthesia Vitals  BP: 113/55 (08/14 1820)  Temp: 36.4 C (97.5 F) (08/14 1820)  Pulse: 72 (08/14 1820)  Respirations: 16 PER MINUTE (08/14 1820)  SpO2: 98 % (08/14 1820)  O2 Device: Nasal cannula (08/14 1820)   Vitals Value Taken Time   BP 113/67 10/03/21 1730   Temp 36 C (96.8 F) 10/03/21 1704   Pulse 70 10/03/21 1740   Respirations 16 PER MINUTE 10/03/21 1740   SpO2 97 % 10/03/21 1740   O2 Device Nasal cannula 10/03/21 1740   ABP     ART BP           Post Anesthesia Evaluation Note    Evaluation location: Pre/Post  Patient participation: recovered; patient participated in evaluation  Level of consciousness: sleepy but conscious  Pain management: adequate    Hydration: normovolemia  Temperature: 36.0C - 38.4C  Airway patency: adequate    Perioperative Events       Post-op nausea and vomiting: no PONV    Postoperative Status  Cardiovascular status: hemodynamically stable  Respiratory status: spontaneous ventilation and supplemental oxygen        Perioperative Events  There were no known notable events for this encounter.

## 2021-10-05 ENCOUNTER — Encounter: Admit: 2021-10-05 | Discharge: 2021-10-05 | Payer: MEDICARE

## 2021-10-05 ENCOUNTER — Inpatient Hospital Stay: Admit: 2021-10-05 | Discharge: 2021-10-05 | Payer: MEDICARE

## 2021-10-05 MED ADMIN — TAMSULOSIN 0.4 MG PO CAP [80077]: 0.4 mg | ORAL | @ 14:00:00 | NDC 00904640161

## 2021-10-05 MED ADMIN — CEFTRIAXONE INJ 2GM IVP [210254]: 2 g | INTRAVENOUS | @ 14:00:00 | NDC 60505614900

## 2021-10-05 MED ADMIN — CLOPIDOGREL 75 MG PO TAB [78966]: 75 mg | ORAL | @ 14:00:00 | NDC 00904629461

## 2021-10-05 MED ADMIN — DAPTOMYCIN SYR [212028]: 700 mg | INTRAVENOUS | @ 23:00:00 | NDC 63323087115

## 2021-10-05 MED ADMIN — MAGNESIUM SULFATE IN D5W 1 GRAM/100 ML IV PGBK [166578]: 1 g | INTRAVENOUS | @ 14:00:00 | Stop: 2021-10-05 | NDC 63323010800

## 2021-10-05 MED ADMIN — METOPROLOL SUCCINATE 25 MG PO TB24 [81866]: 12.5 mg | ORAL | @ 14:00:00 | NDC 00904632261

## 2021-10-05 MED ADMIN — MEXILETINE 150 MG PO CAP [10595]: 150 mg | ORAL | @ 01:00:00 | NDC 00093873901

## 2021-10-05 MED ADMIN — SODIUM CHLORIDE 0.9 % FLUSH [210767]: 10 mL | @ 19:00:00 | NDC 08290306546

## 2021-10-05 MED ADMIN — ACETAMINOPHEN 325 MG PO TAB [101]: 650 mg | ORAL | @ 21:00:00 | NDC 00904677361

## 2021-10-05 MED ADMIN — BUMETANIDE 2 MG PO TAB [9311]: 3 mg | ORAL | @ 23:00:00 | NDC 50268013211

## 2021-10-05 MED ADMIN — MAGNESIUM OXIDE 400 MG (241.3 MG MAGNESIUM) PO TAB [10491]: 400 mg | ORAL | @ 14:00:00 | NDC 64980033912

## 2021-10-05 MED ADMIN — ALLOPURINOL 100 MG PO TAB [310]: 300 mg | ORAL | @ 14:00:00 | NDC 00904704161

## 2021-10-05 MED ADMIN — MONTELUKAST 10 MG PO TAB [81627]: 10 mg | ORAL | @ 01:00:00 | NDC 00904680861

## 2021-10-05 MED ADMIN — BUDESONIDE 0.25 MG/2 ML IN NBSP [81321]: 0.25 mg | RESPIRATORY_TRACT | @ 15:00:00 | NDC 00487960101

## 2021-10-05 MED ADMIN — ARFORMOTEROL 15 MCG/2 ML IN NEBU [135894]: 15 ug | RESPIRATORY_TRACT | @ 15:00:00

## 2021-10-05 MED ADMIN — GABAPENTIN 400 MG PO CAP [18307]: 400 mg | ORAL | @ 19:00:00 | NDC 00904666761

## 2021-10-05 MED ADMIN — SODIUM CHLORIDE 3 % IN NEBU [7327]: 4 mL | RESPIRATORY_TRACT | @ 15:00:00 | NDC 00487900360

## 2021-10-05 MED ADMIN — FERROUS GLUCONATE 324 MG (37.5 MG IRON) PO TAB [308378]: 324 mg | ORAL | @ 01:00:00 | NDC 79854050032

## 2021-10-05 MED ADMIN — SODIUM CHLORIDE 0.9 % IV PGBK (MB+) [95161]: 2 g | INTRAVENOUS | @ 01:00:00 | NDC 00338915930

## 2021-10-05 MED ADMIN — CEFEPIME 2 GRAM IJ SOLR [78195]: 2 g | INTRAVENOUS | @ 01:00:00 | NDC 60505614700

## 2021-10-05 MED ADMIN — BUMETANIDE 0.5 MG PO TAB [9309]: 3 mg | ORAL | @ 23:00:00 | NDC 69238148901

## 2021-10-05 MED ADMIN — CHOLECALCIFEROL (VITAMIN D3) 25 MCG (1,000 UNIT) PO TAB [130074]: 1000 [IU] | ORAL | @ 14:00:00 | NDC 80681016900

## 2021-10-05 MED ADMIN — ASPIRIN 81 MG PO CHEW [680]: 81 mg | ORAL | @ 14:00:00 | NDC 00904679480

## 2021-10-05 MED ADMIN — SODIUM CHLORIDE 3 % IN NEBU [7327]: 4 mL | RESPIRATORY_TRACT | @ 02:00:00 | NDC 00487900360

## 2021-10-05 MED ADMIN — ALBUTEROL SULFATE 2.5 MG/0.5 ML IN NEBU [93139]: 2.5 mg | RESPIRATORY_TRACT | @ 02:00:00 | NDC 00487990130

## 2021-10-05 MED ADMIN — AMIODARONE 200 MG PO TAB [9066]: 400 mg | ORAL | @ 01:00:00 | NDC 00904699361

## 2021-10-05 MED ADMIN — OXYCODONE 5 MG PO TAB [10814]: 5 mg | ORAL | @ 21:00:00 | NDC 00406055223

## 2021-10-05 MED ADMIN — FINASTERIDE 5 MG PO TAB [82679]: 5 mg | ORAL | @ 14:00:00 | NDC 00904683061

## 2021-10-05 MED ADMIN — VENLAFAXINE 75 MG PO CP24 [77315]: 75 mg | ORAL | @ 14:00:00 | NDC 00904707761

## 2021-10-05 MED ADMIN — ASCORBIC ACID (VITAMIN C) 500 MG PO TAB [664]: 500 mg | ORAL | @ 14:00:00 | NDC 68094011359

## 2021-10-05 MED ADMIN — PANTOPRAZOLE 40 MG PO TBEC [80436]: 40 mg | ORAL | @ 14:00:00 | NDC 00904647461

## 2021-10-05 MED ADMIN — B COMP NO3-FOLIC-C-BIOTIN-ZINC 1-60-300-12.5 MG-MG-MCG-MG PO TAB [173534]: 1 | ORAL | @ 14:00:00 | NDC 59528031701

## 2021-10-05 MED ADMIN — MAGNESIUM OXIDE 400 MG (241.3 MG MAGNESIUM) PO TAB [10491]: 400 mg | ORAL | @ 01:00:00 | NDC 64980033912

## 2021-10-05 MED ADMIN — OMEGA 3-DHA-EPA-FISH OIL 300-1,000 MG PO CPDR [306443]: 1000 mg | ORAL | @ 14:00:00 | NDC 79854006900

## 2021-10-05 MED ADMIN — MEXILETINE 150 MG PO CAP [10595]: 150 mg | ORAL | @ 14:00:00 | NDC 00093873901

## 2021-10-05 MED ADMIN — AMIODARONE 200 MG PO TAB [9066]: 400 mg | ORAL | @ 14:00:00 | NDC 00904699361

## 2021-10-05 MED ADMIN — ALBUTEROL SULFATE 2.5 MG/0.5 ML IN NEBU [93139]: 2.5 mg | RESPIRATORY_TRACT | @ 14:00:00 | NDC 00487990130

## 2021-10-05 MED ADMIN — BUDESONIDE 0.25 MG/2 ML IN NBSP [81321]: 0.25 mg | RESPIRATORY_TRACT | @ 02:00:00 | NDC 00487960101

## 2021-10-05 MED ADMIN — ARFORMOTEROL 15 MCG/2 ML IN NEBU [135894]: 15 ug | RESPIRATORY_TRACT | @ 02:00:00

## 2021-10-05 MED ADMIN — BUMETANIDE 2 MG PO TAB [9311]: 3 mg | ORAL | @ 14:00:00 | NDC 50268013211

## 2021-10-05 MED ADMIN — BUMETANIDE 0.5 MG PO TAB [9309]: 3 mg | ORAL | @ 14:00:00 | NDC 69238148901

## 2021-10-05 MED ADMIN — POTASSIUM CHLORIDE 20 MEQ PO TBTQ [35943]: 40 meq | ORAL | @ 14:00:00 | Stop: 2021-10-05 | NDC 00832532511

## 2021-10-06 ENCOUNTER — Encounter: Admit: 2021-10-06 | Discharge: 2021-10-06 | Payer: MEDICARE

## 2021-10-06 MED ADMIN — ASCORBIC ACID (VITAMIN C) 500 MG PO TAB [664]: 500 mg | ORAL | @ 14:00:00 | NDC 68094011359

## 2021-10-06 MED ADMIN — BUMETANIDE 0.5 MG PO TAB [9309]: 3 mg | ORAL | @ 14:00:00 | NDC 69238148901

## 2021-10-06 MED ADMIN — ARFORMOTEROL 15 MCG/2 ML IN NEBU [135894]: 15 ug | RESPIRATORY_TRACT | @ 14:00:00

## 2021-10-06 MED ADMIN — SODIUM CHLORIDE 3 % IN NEBU [7327]: 4 mL | RESPIRATORY_TRACT | @ 14:00:00 | NDC 00487900360

## 2021-10-06 MED ADMIN — ALBUTEROL SULFATE 2.5 MG/0.5 ML IN NEBU [93139]: 2.5 mg | RESPIRATORY_TRACT | @ 02:00:00 | NDC 00487990130

## 2021-10-06 MED ADMIN — TAMSULOSIN 0.4 MG PO CAP [80077]: 0.4 mg | ORAL | @ 14:00:00 | NDC 00904640161

## 2021-10-06 MED ADMIN — ENOXAPARIN 40 MG/0.4 ML SC SYRG [85052]: 40 mg | SUBCUTANEOUS | @ 03:00:00 | NDC 00781324602

## 2021-10-06 MED ADMIN — BUDESONIDE 0.25 MG/2 ML IN NBSP [81321]: 0.25 mg | RESPIRATORY_TRACT | @ 14:00:00 | NDC 00487960101

## 2021-10-06 MED ADMIN — MAGNESIUM OXIDE 400 MG (241.3 MG MAGNESIUM) PO TAB [10491]: 400 mg | ORAL | @ 03:00:00 | NDC 64980033912

## 2021-10-06 MED ADMIN — ALLOPURINOL 100 MG PO TAB [310]: 300 mg | ORAL | @ 14:00:00 | NDC 00904704161

## 2021-10-06 MED ADMIN — B COMP NO3-FOLIC-C-BIOTIN-ZINC 1-60-300-12.5 MG-MG-MCG-MG PO TAB [173534]: 1 | ORAL | @ 14:00:00 | NDC 59528031701

## 2021-10-06 MED ADMIN — FINASTERIDE 5 MG PO TAB [82679]: 5 mg | ORAL | @ 14:00:00 | NDC 00904683061

## 2021-10-06 MED ADMIN — ACETAMINOPHEN 325 MG PO TAB [101]: 650 mg | ORAL | @ 20:00:00 | NDC 00904677361

## 2021-10-06 MED ADMIN — BUMETANIDE 2 MG PO TAB [9311]: 3 mg | ORAL | @ 23:00:00 | NDC 50268013211

## 2021-10-06 MED ADMIN — BUMETANIDE 2 MG PO TAB [9311]: 3 mg | ORAL | @ 14:00:00 | NDC 50268013211

## 2021-10-06 MED ADMIN — AMIODARONE 200 MG PO TAB [9066]: 400 mg | ORAL | @ 14:00:00 | NDC 00904699361

## 2021-10-06 MED ADMIN — CHOLECALCIFEROL (VITAMIN D3) 25 MCG (1,000 UNIT) PO TAB [130074]: 1000 [IU] | ORAL | @ 14:00:00 | NDC 80681016900

## 2021-10-06 MED ADMIN — MAGNESIUM OXIDE 400 MG (241.3 MG MAGNESIUM) PO TAB [10491]: 400 mg | ORAL | @ 14:00:00 | NDC 64980033912

## 2021-10-06 MED ADMIN — OMEGA 3-DHA-EPA-FISH OIL 300-1,000 MG PO CPDR [306443]: 1000 mg | ORAL | @ 14:00:00 | NDC 79854006900

## 2021-10-06 MED ADMIN — MONTELUKAST 10 MG PO TAB [81627]: 10 mg | ORAL | @ 03:00:00 | NDC 00904680861

## 2021-10-06 MED ADMIN — DAPTOMYCIN SYR [212028]: 700 mg | INTRAVENOUS | @ 23:00:00 | NDC 63323087115

## 2021-10-06 MED ADMIN — CEFTRIAXONE INJ 2GM IVP [210254]: 2 g | INTRAVENOUS | @ 14:00:00 | NDC 60505614900

## 2021-10-06 MED ADMIN — MEXILETINE 150 MG PO CAP [10595]: 150 mg | ORAL | @ 03:00:00 | NDC 00093873901

## 2021-10-06 MED ADMIN — ASPIRIN 81 MG PO CHEW [680]: 81 mg | ORAL | @ 14:00:00 | NDC 00904679480

## 2021-10-06 MED ADMIN — ALBUTEROL SULFATE 2.5 MG/0.5 ML IN NEBU [93139]: 2.5 mg | RESPIRATORY_TRACT | @ 14:00:00 | NDC 00487990130

## 2021-10-06 MED ADMIN — ARFORMOTEROL 15 MCG/2 ML IN NEBU [135894]: 15 ug | RESPIRATORY_TRACT | @ 02:00:00

## 2021-10-06 MED ADMIN — PANTOPRAZOLE 40 MG PO TBEC [80436]: 40 mg | ORAL | @ 14:00:00 | NDC 00904647461

## 2021-10-06 MED ADMIN — BUMETANIDE 0.5 MG PO TAB [9309]: 3 mg | ORAL | @ 23:00:00 | NDC 69238148901

## 2021-10-06 MED ADMIN — SODIUM CHLORIDE 3 % IN NEBU [7327]: 4 mL | RESPIRATORY_TRACT | @ 02:00:00 | NDC 00487900360

## 2021-10-06 MED ADMIN — OXYCODONE 5 MG PO TAB [10814]: 5 mg | ORAL | @ 09:00:00 | NDC 00406055223

## 2021-10-06 MED ADMIN — MEXILETINE 150 MG PO CAP [10595]: 150 mg | ORAL | @ 14:00:00 | NDC 00093873901

## 2021-10-06 MED ADMIN — AMIODARONE 200 MG PO TAB [9066]: 400 mg | ORAL | @ 03:00:00 | NDC 00904699361

## 2021-10-06 MED ADMIN — METOPROLOL SUCCINATE 25 MG PO TB24 [81866]: 12.5 mg | ORAL | @ 14:00:00 | NDC 00904632261

## 2021-10-06 MED ADMIN — CLOPIDOGREL 75 MG PO TAB [78966]: 75 mg | ORAL | @ 14:00:00 | NDC 00904629461

## 2021-10-06 MED ADMIN — BUDESONIDE 0.25 MG/2 ML IN NBSP [81321]: 0.25 mg | RESPIRATORY_TRACT | @ 03:00:00 | NDC 00487960101

## 2021-10-06 MED ADMIN — VENLAFAXINE 75 MG PO CP24 [77315]: 75 mg | ORAL | @ 14:00:00 | NDC 00904707761

## 2021-10-06 MED ADMIN — OXYCODONE 5 MG PO TAB [10814]: 5 mg | ORAL | @ 20:00:00 | NDC 00406055223

## 2021-10-07 ENCOUNTER — Encounter: Admit: 2021-10-07 | Discharge: 2021-10-07 | Payer: MEDICARE

## 2021-10-07 MED ORDER — AMIODARONE 200 MG PO TAB
ORAL_TABLET | 0 refills
Start: 2021-10-07 — End: ?

## 2021-10-07 MED ADMIN — TAMSULOSIN 0.4 MG PO CAP [80077]: 0.4 mg | ORAL | @ 14:00:00 | NDC 00904640161

## 2021-10-07 MED ADMIN — ACETAMINOPHEN 325 MG PO TAB [101]: 650 mg | ORAL | @ 02:00:00 | NDC 00904677361

## 2021-10-07 MED ADMIN — ALBUTEROL SULFATE 2.5 MG/0.5 ML IN NEBU [93139]: 2.5 mg | RESPIRATORY_TRACT | @ 01:00:00 | NDC 00487990130

## 2021-10-07 MED ADMIN — MONTELUKAST 10 MG PO TAB [81627]: 10 mg | ORAL | @ 02:00:00 | NDC 00904680861

## 2021-10-07 MED ADMIN — SODIUM CHLORIDE 3 % IN NEBU [7327]: 4 mL | RESPIRATORY_TRACT | @ 01:00:00 | NDC 76204002260

## 2021-10-07 MED ADMIN — OMEGA 3-DHA-EPA-FISH OIL 300-1,000 MG PO CPDR [306443]: 1000 mg | ORAL | @ 14:00:00 | NDC 79854006900

## 2021-10-07 MED ADMIN — VENLAFAXINE 75 MG PO CP24 [77315]: 75 mg | ORAL | @ 14:00:00 | NDC 00904707761

## 2021-10-07 MED ADMIN — METOPROLOL SUCCINATE 25 MG PO TB24 [81866]: 12.5 mg | ORAL | @ 14:00:00 | NDC 00904632261

## 2021-10-07 MED ADMIN — ALBUTEROL SULFATE 2.5 MG/0.5 ML IN NEBU [93139]: 2.5 mg | RESPIRATORY_TRACT | @ 14:00:00 | NDC 00487990130

## 2021-10-07 MED ADMIN — SODIUM CHLORIDE 0.9 % FLUSH [210767]: 10 mL | @ 14:00:00 | NDC 08290306546

## 2021-10-07 MED ADMIN — POLYETHYLENE GLYCOL 3350 17 GRAM PO PWPK [25424]: 17 g | ORAL | NDC 00904693186

## 2021-10-07 MED ADMIN — BUMETANIDE 2 MG PO TAB [9311]: 3 mg | ORAL | @ 14:00:00 | NDC 50268013211

## 2021-10-07 MED ADMIN — B COMP NO3-FOLIC-C-BIOTIN-ZINC 1-60-300-12.5 MG-MG-MCG-MG PO TAB [173534]: 1 | ORAL | @ 14:00:00 | NDC 59528031701

## 2021-10-07 MED ADMIN — FERROUS GLUCONATE 324 MG (37.5 MG IRON) PO TAB [308378]: 324 mg | ORAL | @ 02:00:00 | NDC 79854050032

## 2021-10-07 MED ADMIN — ALLOPURINOL 100 MG PO TAB [310]: 300 mg | ORAL | @ 14:00:00 | NDC 00904704161

## 2021-10-07 MED ADMIN — ARFORMOTEROL 15 MCG/2 ML IN NEBU [135894]: 15 ug | RESPIRATORY_TRACT | @ 14:00:00

## 2021-10-07 MED ADMIN — ASCORBIC ACID (VITAMIN C) 500 MG PO TAB [664]: 500 mg | ORAL | @ 14:00:00 | NDC 68094011359

## 2021-10-07 MED ADMIN — MAGNESIUM SULFATE IN D5W 1 GRAM/100 ML IV PGBK [166578]: 1 g | INTRAVENOUS | @ 18:00:00 | Stop: 2021-10-07 | NDC 63323010800

## 2021-10-07 MED ADMIN — CEFTRIAXONE INJ 2GM IVP [210254]: 2 g | INTRAVENOUS | @ 14:00:00 | NDC 60505614900

## 2021-10-07 MED ADMIN — MEXILETINE 150 MG PO CAP [10595]: 150 mg | ORAL | @ 14:00:00 | NDC 00093873901

## 2021-10-07 MED ADMIN — MAGNESIUM OXIDE 400 MG (241.3 MG MAGNESIUM) PO TAB [10491]: 400 mg | ORAL | @ 14:00:00 | NDC 64980033912

## 2021-10-07 MED ADMIN — ASPIRIN 81 MG PO CHEW [680]: 81 mg | ORAL | @ 14:00:00 | NDC 00904679480

## 2021-10-07 MED ADMIN — MAGNESIUM OXIDE 400 MG (241.3 MG MAGNESIUM) PO TAB [10491]: 400 mg | ORAL | @ 02:00:00 | NDC 64980033912

## 2021-10-07 MED ADMIN — DAPTOMYCIN SYR [212028]: 700 mg | INTRAVENOUS | @ 22:00:00 | NDC 63323087115

## 2021-10-07 MED ADMIN — CHOLECALCIFEROL (VITAMIN D3) 25 MCG (1,000 UNIT) PO TAB [130074]: 1000 [IU] | ORAL | @ 14:00:00 | NDC 80681016900

## 2021-10-07 MED ADMIN — ENOXAPARIN 40 MG/0.4 ML SC SYRG [85052]: 40 mg | SUBCUTANEOUS | @ 02:00:00 | NDC 00781324602

## 2021-10-07 MED ADMIN — BUMETANIDE 2 MG PO TAB [9311]: 3 mg | ORAL | @ 22:00:00 | NDC 50268013211

## 2021-10-07 MED ADMIN — MAGNESIUM SULFATE IN D5W 1 GRAM/100 ML IV PGBK [166578]: 1 g | INTRAVENOUS | @ 14:00:00 | Stop: 2021-10-07 | NDC 63323010800

## 2021-10-07 MED ADMIN — BUDESONIDE 0.25 MG/2 ML IN NBSP [81321]: 0.25 mg | RESPIRATORY_TRACT | @ 14:00:00 | NDC 00487960101

## 2021-10-07 MED ADMIN — POLYETHYLENE GLYCOL 3350 17 GRAM PO PWPK [25424]: 17 g | ORAL | @ 15:00:00 | NDC 00904693186

## 2021-10-07 MED ADMIN — SODIUM CHLORIDE 3 % IN NEBU [7327]: 4 mL | RESPIRATORY_TRACT | @ 14:00:00 | NDC 00487900360

## 2021-10-07 MED ADMIN — CLOPIDOGREL 75 MG PO TAB [78966]: 75 mg | ORAL | @ 14:00:00 | NDC 00904629461

## 2021-10-07 MED ADMIN — MEXILETINE 150 MG PO CAP [10595]: 150 mg | ORAL | @ 02:00:00 | NDC 00093873901

## 2021-10-07 MED ADMIN — FINASTERIDE 5 MG PO TAB [82679]: 5 mg | ORAL | @ 14:00:00 | NDC 00904683061

## 2021-10-07 MED ADMIN — BUDESONIDE 0.25 MG/2 ML IN NBSP [81321]: 0.25 mg | RESPIRATORY_TRACT | @ 01:00:00 | NDC 00093681519

## 2021-10-07 MED ADMIN — AMIODARONE 200 MG PO TAB [9066]: 400 mg | ORAL | @ 02:00:00 | NDC 00904699361

## 2021-10-07 MED ADMIN — ARFORMOTEROL 15 MCG/2 ML IN NEBU [135894]: 15 ug | RESPIRATORY_TRACT | @ 01:00:00

## 2021-10-07 MED ADMIN — POLYETHYLENE GLYCOL 3350 17 GRAM PO PWPK [25424]: 17 g | ORAL | @ 02:00:00 | NDC 00904693186

## 2021-10-07 MED ADMIN — AMIODARONE 200 MG PO TAB [9066]: 400 mg | ORAL | @ 14:00:00 | NDC 00904699361

## 2021-10-07 MED ADMIN — PANTOPRAZOLE 40 MG PO TBEC [80436]: 40 mg | ORAL | @ 14:00:00 | NDC 00904647461

## 2021-10-07 MED ADMIN — OXYCODONE 5 MG PO TAB [10814]: 5 mg | ORAL | @ 04:00:00 | NDC 00406055223

## 2021-10-07 MED ADMIN — BUMETANIDE 0.5 MG PO TAB [9309]: 3 mg | ORAL | @ 22:00:00 | NDC 69238148901

## 2021-10-07 NOTE — Telephone Encounter
Name from pharmacy: Amiodarone HCl 200 MG Oral Tablet          Will file in chart as: amiodarone (CORDARONE) 200 mg tablet    Sig: TAKE 2 TABLETS BY MOUTH TWICE DAILY UNTIL FOLLOW UP WITH EP    Disp:  240 tablet    Refills:  0    Start: 10/07/2021    Class: Normal    Last ordered: 1 month ago (08/12/2021) by Clinton Sawyer, MBBS    Last refill: 08/13/2021    Rx #: [id number]    Cardiovascular: Antiarrhythmic Agents - Amiodarone Failed 10/07/2021 12:03 PM   Protocol Details  This refill cannot be delegated    Manual Review: Eye Exam on file in the last 12 months    Valid encounter within last 6 months    ECG completed within last 6 months    BP completed in the last 6 months    Heart Rate completed in the last 6 months    Chest Imaging completed within last 6 months    TSH in normal range and within 180 days    Mg Level in normal range and within 180 days    K in normal range and within 180 days    AST in normal range and within 180 days    ALT in normal range and within 180 days        Routing to PCP for review

## 2021-10-08 MED ADMIN — VENLAFAXINE 75 MG PO CP24 [77315]: 75 mg | ORAL | @ 14:00:00 | NDC 00904707761

## 2021-10-08 MED ADMIN — BUMETANIDE 2 MG PO TAB [9311]: 3 mg | ORAL | @ 23:00:00 | NDC 50268013211

## 2021-10-08 MED ADMIN — ALLOPURINOL 100 MG PO TAB [310]: 300 mg | ORAL | @ 14:00:00 | NDC 00904704161

## 2021-10-08 MED ADMIN — MEXILETINE 150 MG PO CAP [10595]: 150 mg | ORAL | @ 14:00:00 | NDC 00093873901

## 2021-10-08 MED ADMIN — MELATONIN 5 MG PO TAB [168576]: 5 mg | ORAL | @ 08:00:00 | NDC 77333052025

## 2021-10-08 MED ADMIN — METOPROLOL SUCCINATE 25 MG PO TB24 [81866]: 12.5 mg | ORAL | @ 14:00:00 | NDC 00904632261

## 2021-10-08 MED ADMIN — BUDESONIDE 0.25 MG/2 ML IN NBSP [81321]: 0.25 mg | RESPIRATORY_TRACT | @ 14:00:00 | NDC 00093681519

## 2021-10-08 MED ADMIN — AMIODARONE 200 MG PO TAB [9066]: 400 mg | ORAL | @ 14:00:00 | NDC 00904699361

## 2021-10-08 MED ADMIN — POTASSIUM CHLORIDE 20 MEQ PO TBTQ [35943]: 40 meq | ORAL | @ 14:00:00 | Stop: 2021-10-08 | NDC 00832532511

## 2021-10-08 MED ADMIN — ALBUTEROL SULFATE 2.5 MG/0.5 ML IN NEBU [93139]: 2.5 mg | RESPIRATORY_TRACT | @ 02:00:00 | NDC 00487990130

## 2021-10-08 MED ADMIN — SODIUM CHLORIDE 3 % IN NEBU [7327]: 4 mL | RESPIRATORY_TRACT | @ 14:00:00 | NDC 76204002260

## 2021-10-08 MED ADMIN — SODIUM CHLORIDE 3 % IN NEBU [7327]: 4 mL | RESPIRATORY_TRACT | @ 02:00:00 | NDC 76204002260

## 2021-10-08 MED ADMIN — MAGNESIUM OXIDE 400 MG (241.3 MG MAGNESIUM) PO TAB [10491]: 400 mg | ORAL | @ 02:00:00 | NDC 64980033912

## 2021-10-08 MED ADMIN — ASPIRIN 81 MG PO CHEW [680]: 81 mg | ORAL | @ 14:00:00 | NDC 00904679480

## 2021-10-08 MED ADMIN — ASCORBIC ACID (VITAMIN C) 500 MG PO TAB [664]: 500 mg | ORAL | @ 14:00:00 | NDC 68094011359

## 2021-10-08 MED ADMIN — MAGNESIUM OXIDE 400 MG (241.3 MG MAGNESIUM) PO TAB [10491]: 400 mg | ORAL | @ 14:00:00 | NDC 64980033912

## 2021-10-08 MED ADMIN — PANTOPRAZOLE 40 MG PO TBEC [80436]: 40 mg | ORAL | @ 14:00:00 | NDC 00904647461

## 2021-10-08 MED ADMIN — BUDESONIDE 0.25 MG/2 ML IN NBSP [81321]: 0.25 mg | RESPIRATORY_TRACT | @ 02:00:00 | NDC 00093681519

## 2021-10-08 MED ADMIN — BUMETANIDE 2 MG PO TAB [9311]: 3 mg | ORAL | @ 14:00:00 | NDC 50268013211

## 2021-10-08 MED ADMIN — DAPTOMYCIN SYR [212028]: 700 mg | INTRAVENOUS | @ 23:00:00 | NDC 63323087115

## 2021-10-08 MED ADMIN — FINASTERIDE 5 MG PO TAB [82679]: 5 mg | ORAL | @ 14:00:00 | NDC 00904683061

## 2021-10-08 MED ADMIN — MAGNESIUM SULFATE IN D5W 1 GRAM/100 ML IV PGBK [166578]: 1 g | INTRAVENOUS | @ 14:00:00 | Stop: 2021-10-08 | NDC 63323010800

## 2021-10-08 MED ADMIN — MEXILETINE 150 MG PO CAP [10595]: 150 mg | ORAL | @ 02:00:00 | NDC 00093873901

## 2021-10-08 MED ADMIN — AMIODARONE 200 MG PO TAB [9066]: 400 mg | ORAL | @ 02:00:00 | NDC 00904699361

## 2021-10-08 MED ADMIN — CHOLECALCIFEROL (VITAMIN D3) 25 MCG (1,000 UNIT) PO TAB [130074]: 1000 [IU] | ORAL | @ 14:00:00 | NDC 80681016900

## 2021-10-08 MED ADMIN — MONTELUKAST 10 MG PO TAB [81627]: 10 mg | ORAL | @ 02:00:00 | NDC 00904680861

## 2021-10-08 MED ADMIN — ARFORMOTEROL 15 MCG/2 ML IN NEBU [135894]: 15 ug | RESPIRATORY_TRACT | @ 02:00:00

## 2021-10-08 MED ADMIN — CEFTRIAXONE INJ 2GM IVP [210254]: 2 g | INTRAVENOUS | @ 14:00:00 | NDC 60505614900

## 2021-10-08 MED ADMIN — OMEGA 3-DHA-EPA-FISH OIL 300-1,000 MG PO CPDR [306443]: 1000 mg | ORAL | @ 14:00:00 | NDC 79854006900

## 2021-10-08 MED ADMIN — ARFORMOTEROL 15 MCG/2 ML IN NEBU [135894]: 15 ug | RESPIRATORY_TRACT | @ 14:00:00

## 2021-10-08 MED ADMIN — CLOPIDOGREL 75 MG PO TAB [78966]: 75 mg | ORAL | @ 14:00:00 | NDC 00904629461

## 2021-10-08 MED ADMIN — B COMP NO3-FOLIC-C-BIOTIN-ZINC 1-60-300-12.5 MG-MG-MCG-MG PO TAB [173534]: 1 | ORAL | @ 14:00:00 | NDC 59528031701

## 2021-10-08 MED ADMIN — TAMSULOSIN 0.4 MG PO CAP [80077]: 0.4 mg | ORAL | @ 14:00:00 | NDC 00904640161

## 2021-10-08 MED ADMIN — ENOXAPARIN 40 MG/0.4 ML SC SYRG [85052]: 40 mg | SUBCUTANEOUS | @ 02:00:00 | NDC 00781324602

## 2021-10-08 MED ADMIN — ALBUTEROL SULFATE 2.5 MG/0.5 ML IN NEBU [93139]: 2.5 mg | RESPIRATORY_TRACT | @ 14:00:00 | NDC 00487990130

## 2021-10-08 MED ADMIN — BUMETANIDE 0.5 MG PO TAB [9309]: 3 mg | ORAL | @ 14:00:00 | NDC 69238148901

## 2021-10-08 MED ADMIN — AMITRIPTYLINE(#)/GABAPENTIN/BACLOFEN 2/6/2% TOPICAL CRM [214099]: TOPICAL | @ 02:00:00 | NDC 54029466709

## 2021-10-08 MED ADMIN — BUMETANIDE 0.5 MG PO TAB [9309]: 3 mg | ORAL | @ 23:00:00 | NDC 69238148901

## 2021-10-08 MED ADMIN — SODIUM CHLORIDE 0.9 % FLUSH [210767]: 10 mL | @ 14:00:00 | NDC 08290306546

## 2021-10-09 MED ADMIN — AMIODARONE 200 MG PO TAB [9066]: 400 mg | ORAL | @ 13:00:00 | NDC 00904699361

## 2021-10-09 MED ADMIN — BUDESONIDE 0.25 MG/2 ML IN NBSP [81321]: 0.25 mg | RESPIRATORY_TRACT | @ 14:00:00 | NDC 00487960101

## 2021-10-09 MED ADMIN — AMIODARONE 200 MG PO TAB [9066]: 400 mg | ORAL | @ 02:00:00 | NDC 00904699361

## 2021-10-09 MED ADMIN — WATER FOR INJECTION, STERILE IJ SOLN [79513]: 10 mL | INTRAVENOUS | @ 16:00:00 | Stop: 2021-10-09 | NDC 00409488717

## 2021-10-09 MED ADMIN — MAGNESIUM OXIDE 400 MG (241.3 MG MAGNESIUM) PO TAB [10491]: 400 mg | ORAL | @ 13:00:00 | NDC 64980033912

## 2021-10-09 MED ADMIN — FERROUS GLUCONATE 324 MG (37.5 MG IRON) PO TAB [308378]: 324 mg | ORAL | @ 02:00:00 | NDC 20555001900

## 2021-10-09 MED ADMIN — SODIUM CHLORIDE 3 % IN NEBU [7327]: 4 mL | RESPIRATORY_TRACT | @ 01:00:00 | NDC 76204002260

## 2021-10-09 MED ADMIN — MAGNESIUM OXIDE 400 MG (241.3 MG MAGNESIUM) PO TAB [10491]: 400 mg | ORAL | @ 02:00:00 | NDC 64980033912

## 2021-10-09 MED ADMIN — PANTOPRAZOLE 40 MG PO TBEC [80436]: 40 mg | ORAL | @ 13:00:00 | NDC 00904647461

## 2021-10-09 MED ADMIN — ALBUTEROL SULFATE 2.5 MG/0.5 ML IN NEBU [93139]: 2.5 mg | RESPIRATORY_TRACT | @ 01:00:00 | NDC 00487990130

## 2021-10-09 MED ADMIN — BUDESONIDE 0.25 MG/2 ML IN NBSP [81321]: 0.25 mg | RESPIRATORY_TRACT | @ 01:00:00 | NDC 00093681519

## 2021-10-09 MED ADMIN — ALLOPURINOL 100 MG PO TAB [310]: 300 mg | ORAL | @ 13:00:00 | NDC 00904704161

## 2021-10-09 MED ADMIN — ARFORMOTEROL 15 MCG/2 ML IN NEBU [135894]: 15 ug | RESPIRATORY_TRACT | @ 14:00:00

## 2021-10-09 MED ADMIN — MEXILETINE 150 MG PO CAP [10595]: 150 mg | ORAL | @ 13:00:00 | NDC 00093873901

## 2021-10-09 MED ADMIN — TAMSULOSIN 0.4 MG PO CAP [80077]: 0.4 mg | ORAL | @ 13:00:00 | NDC 00904640161

## 2021-10-09 MED ADMIN — SODIUM CHLORIDE 3 % IN NEBU [7327]: 4 mL | RESPIRATORY_TRACT | @ 14:00:00 | NDC 76204002260

## 2021-10-09 MED ADMIN — MAGNESIUM SULFATE IN D5W 1 GRAM/100 ML IV PGBK [166578]: 1 g | INTRAVENOUS | @ 13:00:00 | Stop: 2021-10-09 | NDC 63323010800

## 2021-10-09 MED ADMIN — METOPROLOL SUCCINATE 25 MG PO TB24 [81866]: 12.5 mg | ORAL | @ 13:00:00 | NDC 00904632261

## 2021-10-09 MED ADMIN — ARFORMOTEROL 15 MCG/2 ML IN NEBU [135894]: 15 ug | RESPIRATORY_TRACT | @ 01:00:00

## 2021-10-09 MED ADMIN — B COMP NO3-FOLIC-C-BIOTIN-ZINC 1-60-300-12.5 MG-MG-MCG-MG PO TAB [173534]: 1 | ORAL | @ 13:00:00 | NDC 59528031701

## 2021-10-09 MED ADMIN — ALTEPLASE 2 MG IK SOLR [82551]: 2 mg | @ 16:00:00 | Stop: 2021-10-09 | NDC 50242004164

## 2021-10-09 MED ADMIN — MELATONIN 5 MG PO TAB [168576]: 5 mg | ORAL | @ 02:00:00 | NDC 77333052025

## 2021-10-09 MED ADMIN — FINASTERIDE 5 MG PO TAB [82679]: 5 mg | ORAL | @ 13:00:00 | NDC 00904683061

## 2021-10-09 MED ADMIN — CEFTRIAXONE INJ 2GM IVP [210254]: 2 g | INTRAVENOUS | @ 13:00:00 | NDC 60505614900

## 2021-10-09 MED ADMIN — MEXILETINE 150 MG PO CAP [10595]: 150 mg | ORAL | @ 02:00:00 | NDC 00093873901

## 2021-10-09 MED ADMIN — VENLAFAXINE 75 MG PO CP24 [77315]: 75 mg | ORAL | @ 13:00:00 | NDC 00904707761

## 2021-10-09 MED ADMIN — DAPTOMYCIN SYR [212028]: 700 mg | INTRAVENOUS | @ 23:00:00 | NDC 63323087115

## 2021-10-09 MED ADMIN — BUMETANIDE 2 MG PO TAB [9311]: 3 mg | ORAL | @ 23:00:00 | NDC 50268013211

## 2021-10-09 MED ADMIN — ALBUTEROL SULFATE 2.5 MG/0.5 ML IN NEBU [93139]: 2.5 mg | RESPIRATORY_TRACT | @ 14:00:00 | NDC 00487990130

## 2021-10-09 MED ADMIN — BUMETANIDE 0.5 MG PO TAB [9309]: 3 mg | ORAL | @ 23:00:00 | NDC 69238148901

## 2021-10-09 MED ADMIN — OMEGA 3-DHA-EPA-FISH OIL 300-1,000 MG PO CPDR [306443]: 1000 mg | ORAL | @ 13:00:00 | NDC 79854006900

## 2021-10-09 MED ADMIN — MONTELUKAST 10 MG PO TAB [81627]: 10 mg | ORAL | @ 02:00:00 | NDC 00904680861

## 2021-10-09 MED ADMIN — ASCORBIC ACID (VITAMIN C) 500 MG PO TAB [664]: 500 mg | ORAL | @ 13:00:00 | NDC 68094011359

## 2021-10-09 MED ADMIN — CLOPIDOGREL 75 MG PO TAB [78966]: 75 mg | ORAL | @ 13:00:00 | NDC 00904629461

## 2021-10-09 MED ADMIN — BUMETANIDE 0.5 MG PO TAB [9309]: 3 mg | ORAL | @ 13:00:00 | NDC 69238148901

## 2021-10-09 MED ADMIN — POTASSIUM CHLORIDE 20 MEQ PO TBTQ [35943]: 20 meq | ORAL | @ 13:00:00 | Stop: 2021-10-09 | NDC 00832532511

## 2021-10-09 MED ADMIN — CHOLECALCIFEROL (VITAMIN D3) 25 MCG (1,000 UNIT) PO TAB [130074]: 1000 [IU] | ORAL | @ 13:00:00 | NDC 80681016900

## 2021-10-09 MED ADMIN — ENOXAPARIN 40 MG/0.4 ML SC SYRG [85052]: 40 mg | SUBCUTANEOUS | @ 02:00:00 | NDC 00781324602

## 2021-10-09 MED ADMIN — ASPIRIN 81 MG PO CHEW [680]: 81 mg | ORAL | @ 13:00:00 | NDC 66553000201

## 2021-10-09 MED ADMIN — SODIUM CHLORIDE 0.9 % FLUSH [210767]: 10 mL | @ 14:00:00 | NDC 08290306546

## 2021-10-10 ENCOUNTER — Encounter: Admit: 2021-10-10 | Discharge: 2021-10-10 | Payer: MEDICARE

## 2021-10-10 MED ADMIN — AMIODARONE 200 MG PO TAB [9066]: 400 mg | ORAL | @ 03:00:00 | NDC 00904699361

## 2021-10-10 MED ADMIN — METOPROLOL SUCCINATE 25 MG PO TB24 [81866]: 12.5 mg | ORAL | @ 14:00:00 | Stop: 2021-10-10 | NDC 00904632261

## 2021-10-10 MED ADMIN — ALBUTEROL SULFATE 2.5 MG/0.5 ML IN NEBU [93139]: 2.5 mg | RESPIRATORY_TRACT | @ 02:00:00 | NDC 00487990130

## 2021-10-10 MED ADMIN — MAGNESIUM OXIDE 400 MG (241.3 MG MAGNESIUM) PO TAB [10491]: 400 mg | ORAL | @ 03:00:00 | NDC 64980033912

## 2021-10-10 MED ADMIN — ARFORMOTEROL 15 MCG/2 ML IN NEBU [135894]: 15 ug | RESPIRATORY_TRACT | @ 14:00:00 | Stop: 2021-10-10

## 2021-10-10 MED ADMIN — SODIUM CHLORIDE 3 % IN NEBU [7327]: 4 mL | RESPIRATORY_TRACT | @ 02:00:00 | NDC 76204002260

## 2021-10-10 MED ADMIN — MAGNESIUM SULFATE IN D5W 1 GRAM/100 ML IV PGBK [166578]: 1 g | INTRAVENOUS | @ 14:00:00 | Stop: 2021-10-10 | NDC 63323010800

## 2021-10-10 MED ADMIN — SODIUM CHLORIDE 3 % IN NEBU [7327]: 4 mL | RESPIRATORY_TRACT | @ 14:00:00 | Stop: 2021-10-10 | NDC 76204002260

## 2021-10-10 MED ADMIN — ALBUTEROL SULFATE 2.5 MG/0.5 ML IN NEBU [93139]: 2.5 mg | RESPIRATORY_TRACT | @ 14:00:00 | Stop: 2021-10-10 | NDC 00487990130

## 2021-10-10 MED ADMIN — FINASTERIDE 5 MG PO TAB [82679]: 5 mg | ORAL | @ 14:00:00 | Stop: 2021-10-10 | NDC 00904683061

## 2021-10-10 MED ADMIN — OMEGA 3-DHA-EPA-FISH OIL 300-1,000 MG PO CPDR [306443]: 1000 mg | ORAL | @ 14:00:00 | Stop: 2021-10-10 | NDC 79854006900

## 2021-10-10 MED ADMIN — CEFTRIAXONE INJ 2GM IVP [210254]: 2 g | INTRAVENOUS | @ 14:00:00 | Stop: 2021-10-10 | NDC 60505614900

## 2021-10-10 MED ADMIN — BUDESONIDE 0.25 MG/2 ML IN NBSP [81321]: 0.25 mg | RESPIRATORY_TRACT | @ 14:00:00 | Stop: 2021-10-10 | NDC 00093681519

## 2021-10-10 MED ADMIN — ALLOPURINOL 100 MG PO TAB [310]: 300 mg | ORAL | @ 14:00:00 | Stop: 2021-10-10 | NDC 00904704161

## 2021-10-10 MED ADMIN — MEXILETINE 150 MG PO CAP [10595]: 150 mg | ORAL | @ 15:00:00 | Stop: 2021-10-10 | NDC 00093873901

## 2021-10-10 MED ADMIN — TAMSULOSIN 0.4 MG PO CAP [80077]: 0.4 mg | ORAL | @ 14:00:00 | Stop: 2021-10-10 | NDC 00904640161

## 2021-10-10 MED ADMIN — ENOXAPARIN 40 MG/0.4 ML SC SYRG [85052]: 40 mg | SUBCUTANEOUS | @ 03:00:00 | NDC 00781324602

## 2021-10-10 MED ADMIN — AMIODARONE 200 MG PO TAB [9066]: 400 mg | ORAL | @ 14:00:00 | Stop: 2021-10-10 | NDC 00904699361

## 2021-10-10 MED ADMIN — MONTELUKAST 10 MG PO TAB [81627]: 10 mg | ORAL | @ 03:00:00 | NDC 00904680861

## 2021-10-10 MED ADMIN — ASCORBIC ACID (VITAMIN C) 500 MG PO TAB [664]: 500 mg | ORAL | @ 14:00:00 | Stop: 2021-10-10 | NDC 68094011359

## 2021-10-10 MED ADMIN — CHOLECALCIFEROL (VITAMIN D3) 25 MCG (1,000 UNIT) PO TAB [130074]: 1000 [IU] | ORAL | @ 14:00:00 | Stop: 2021-10-10 | NDC 80681016900

## 2021-10-10 MED ADMIN — MEXILETINE 150 MG PO CAP [10595]: 150 mg | ORAL | @ 03:00:00 | NDC 00093873901

## 2021-10-10 MED ADMIN — POTASSIUM CHLORIDE 20 MEQ PO TBTQ [35943]: 20 meq | ORAL | @ 14:00:00 | Stop: 2021-10-10 | NDC 00832532511

## 2021-10-10 MED ADMIN — OXYCODONE 5 MG PO TAB [10814]: 5 mg | ORAL | @ 03:00:00 | NDC 00406055223

## 2021-10-10 MED ADMIN — BUDESONIDE 0.25 MG/2 ML IN NBSP [81321]: 0.25 mg | RESPIRATORY_TRACT | @ 02:00:00 | NDC 00093681519

## 2021-10-10 MED ADMIN — MELATONIN 5 MG PO TAB [168576]: 5 mg | ORAL | @ 03:00:00 | NDC 77333052025

## 2021-10-10 MED ADMIN — ASPIRIN 81 MG PO CHEW [680]: 81 mg | ORAL | @ 14:00:00 | Stop: 2021-10-10 | NDC 66553000201

## 2021-10-10 MED ADMIN — SODIUM CHLORIDE 0.9 % FLUSH [210767]: 10 mL | @ 14:00:00 | Stop: 2021-10-10 | NDC 08290306546

## 2021-10-10 MED ADMIN — BUMETANIDE 2 MG PO TAB [9311]: 3 mg | ORAL | @ 14:00:00 | Stop: 2021-10-10 | NDC 50268013211

## 2021-10-10 MED ADMIN — B COMP NO3-FOLIC-C-BIOTIN-ZINC 1-60-300-12.5 MG-MG-MCG-MG PO TAB [173534]: 1 | ORAL | @ 14:00:00 | Stop: 2021-10-10 | NDC 59528031701

## 2021-10-10 MED ADMIN — ARFORMOTEROL 15 MCG/2 ML IN NEBU [135894]: 15 ug | RESPIRATORY_TRACT | @ 02:00:00

## 2021-10-10 MED ADMIN — CLOPIDOGREL 75 MG PO TAB [78966]: 75 mg | ORAL | @ 14:00:00 | Stop: 2021-10-10 | NDC 00904629461

## 2021-10-10 MED ADMIN — PANTOPRAZOLE 40 MG PO TBEC [80436]: 40 mg | ORAL | @ 14:00:00 | Stop: 2021-10-10 | NDC 00904647461

## 2021-10-10 MED ADMIN — VENLAFAXINE 75 MG PO CP24 [77315]: 75 mg | ORAL | @ 14:00:00 | Stop: 2021-10-10 | NDC 00904707761

## 2021-10-10 MED ADMIN — MAGNESIUM OXIDE 400 MG (241.3 MG MAGNESIUM) PO TAB [10491]: 400 mg | ORAL | @ 14:00:00 | Stop: 2021-10-10 | NDC 64980033912

## 2021-10-10 NOTE — Telephone Encounter
Hospital Discharge Follow Up      Reached Patient: No, transferred to Skilled Nursing Facility, [facility] Swing Bed  Patient Date of Birth: 02/21/1941     Admission Information:     Hospital Name: [facility] (includes main campus, [place], [place], [place], [place])  Admission Date: 10/02/2021    Discharge Date: 10/10/2021    Admission Diagnosis: Chronic osteomyelitis of the right femur, Chronic multifocal osteomyelitis unspecified femur  Discharge Diagnosis: Chronic combined systolic and diastolic heart failure, Chronic multifocal osteomyelitis of right femur, Chronic multifocal osteomyelitis unspecified femur, Atrial fibrillation, CAD, COPD, Chronic GERD, Moderate persistent asthma unspecified whether complicated  Has there been a discharge within the last 30 days? Yes  If yes, reason: 09/26/2021 cellulitis  Hospital Services: Unplanned  Today's call is 1 (business) days post discharge    Meds reviewed and reconciled? Yes    Current Outpatient Medications   Medication Instructions   ? albuterol 0.083% (PROVENTIL) 2.5 mg /3 mL (0.083 %) nebulizer solution Inhale 3 mL solution by nebulizer as directed every 6 hours as needed for Wheezing or Shortness of Breath. Indications: asthma   ? allopurinoL (ZYLOPRIM) 300 mg, Oral, DAILY, take with food   ? amiodarone (CORDARONE) 200 mg tablet TAKE 2 TABLETS BY MOUTH TWICE DAILY UNTIL FOLLOW UP WITH EP   ? amitr-gabapen-emu oil 05-24-08% topical cream (BATCHED COMPOUND) Apply to affected areas twice daily as needed.   ? arformoteroL (BROVANA) 15 mcg/2 mL nebulizer solution Inhale 2 mL solution by nebulizer as directed twice daily. Dx: J44.9   ? ascorbic acid (vitamin C) (ASCORBIC ACID (VITAMIN C)) 500 mg, Oral, DAILY   ? aspirin EC (ASPIR-LOW) 81 mg, Oral, DAILY, Take with food.   ? atorvastatin (LIPITOR) 40 mg, Oral, DAILY, HOLD UNTIL AFTER DAPTOMYCIN COURSE FINISHES.   ? azelastine (ASTELIN) 137 mcg (0.1 %) nasal spray 2 sprays, Each Nostril, TWICE DAILY, Use in each nostril as directed   ? budesonide (PULMICORT) 0.25 mg/2 mL nebulizer solution Inhale 2 mL solution by nebulizer as directed twice daily. Dx: J44.9   ? bumetanide (BUMEX) 1 mg tablet Take with 2 mg tablet twice daily to make a total of 3 mg.   ? bumetanide (BUMEX) 2 mg tablet Take one tablet twice daily. Take along with 1 mg tablet for a total of 3 mg twice daily.   ? cefTRIAXone (ROCEPHIN) 2 gram/50 mL bag 2 g, Intravenous, EVERY 24 HOURS   ? CHOLEcalciferoL (vitamin D3) 1,000 Units, Oral, DAILY   ? clopiDOGreL (PLAVIX) 75 mg, Oral, DAILY, HOLD UNTIL AFTER BONE BIOPSY 10/03/21   ? DAPTOmycin (CUBICIN) 8 mg/kg, Intravenous, EVERY 24 HOURS   ? diclofenac sodium (VOLTAREN) 4 g, Topical, FOUR TIMES DAILY PRN   ? ferrous gluconate 324 mg, Oral, EVERY 48 HOURS   ? finasteride (PROSCAR) 5 mg, Oral, DAILY   ? fish oil- omega 3-DHA/EPA 300/1,000 mg capsule 1 capsule, Oral, DAILY   ? gabapentin (NEURONTIN) 400 mg, Oral, TWICE DAILY   ? guaiFENesin (ROBITUSSIN) 200 mg, Oral, EVERY  4 HOURS PRN   ? HYDROcodone/acetaminophen (NORCO) 5/325 mg tablet 1 tablet, Oral, EVERY  6 HOURS PRN   ? magnesium oxide 400 mg, Oral, TWICE DAILY   ? mepolizumab (NUCALA) 100 mg/mL injection pen Inject 100 mg under the skin every 28 days.   ? metOLazone (ZAROXOLYN) 2.5 mg, Oral, DAILY  PRN, PER OUTPATIENT CARDIOLOGY PROVIDER AS NEEDED FOR FLUID RETENTION   ? metoprolol succinate XL (TOPROL XL) 12.5  mg, Oral, DAILY   ? mexiletine (MEXITIL) 150 mg capsule Take 150 mg three times daily until follow up with EP   ? montelukast (SINGULAIR) 10 mg, Oral, AT BEDTIME DAILY   ? multivit-iron-FA-calcium-mins (THERA-M) 9 mg iron-400 mcg tablet 1 tablet, Oral, DAILY   ? naloxone (NARCAN) 4 mg/actuation nasal spray Insert 1 spray into 1 nostril as needed for signs of opioid overdose then call 911. May repeat dose every 2-3 minutes (alternate nostrils) until medical team arrives.   ? nitroglycerin (NITROSTAT) 0.4 mg, Sublingual, EVERY  5 MIN PRN, Max of 3 tablets, call 911.    ? pantoprazole DR (PROTONIX) 40 mg, Oral, DAILY   ? polyethylene glycol 3350 (MIRALAX) 17 g, Oral, DAILY  PRN   ? potassium chloride (KLOR-CON M10) 10 mEq tablet 20 mEq, Oral, DAILY, Take with a meal and a full glass of water.   ? sennosides-docusate sodium (SENOKOT-S) 8.6/50 mg tablet 1 tablet, Oral, DAILY   ? sodium chloride (HYPER-SAL) 3.5 % inhalation solution 4 mL, Inhalation, TWICE DAILY, Dx: J47.9   ? sodium chloride/aloe vera (AYR SALINE GEL NA) Nasal, AS NEEDED   ? spironolactone (ALDACTONE) 12.5 mg, Oral, DAILY, Take with food.   ? tamsulosin (FLOMAX) 0.4 mg capsule TAKE 1 CAPSULE BY MOUTH ONCE DAILY (DO  NOT  CRUSH,  CHEW  OR  OPEN  CAPSULES  (TAKE  30  MINUTES  FOLLOWING  THE  SAME  MEAL  EACH  DAY).   ? TRULICITY 0.75 mg/0.5 mL injection pen INJECT 0.5 ML UNDER THE SKIN EVERY 7 DAYS FOR TYPE 2 DIABETES.   ? venlafaxine XR (EFFEXOR XR) 75 mg, Oral, DAILY, Take with food.      Scheduling Follow-up Appointment   Upcoming appointments:   Future Appointments   Date Time Provider Department Center   10/13/2021  2:40 PM Joanie Coddington, MD [facility] SPINE   10/26/2021  9:00 AM PF LAB SCHEDULE C [id number] None   10/26/2021 10:20 AM Deveron Furlong, MD MP[facility]M IM   10/31/2021 12:00 AM MAC REMOTE MONITORING MACREMOTEHRM CVM Procedur   11/29/2021  1:30 PM Fredricka Bonine, APRN-NP CVMSNCL CVM Exam   12/01/2021 12:00 AM MAC REMOTE MONITORING MACREMOTEHRM CVM Procedur   01/02/2022 12:00 AM MAC REMOTE MONITORING MACREMOTEHRM CVM Procedur   01/16/2022 11:20 AM Comfort, [name], MD [facility] IM   01/24/2022  1:45 PM Donnelly Stager, MD [facility] CVM Exam   02/02/2022 12:00 AM MAC REMOTE MONITORING MACREMOTEHRM CVM Procedur   03/06/2022 12:00 AM MAC REMOTE MONITORING MACREMOTEHRM CVM Procedur   04/06/2022 12:00 AM MAC REMOTE MONITORING MACREMOTEHRM CVM Procedur   05/08/2022 12:00 AM MAC REMOTE MONITORING MACREMOTEHRM CVM Procedur   06/08/2022 12:00 AM MAC REMOTE MONITORING MACREMOTEHRM CVM Procedur     Does the patient require 7 day follow up appointment? No  Hospital Follow-Up scheduled with PCP? No, patient has appointment with pcp on 01/16/2022  When was patient?s last PCP visit: 09/26/2021   PCP primary location: [facility]  Specialist appointment scheduled? Yes, with spine 10/13/2021; pulmonary 10/26/2021; Cardiology 11/29/2021 and 01/24/2022    MyChart message sent? Active in MyChart. No message sent.   Artera text sent? No    [name], RN

## 2021-10-10 NOTE — Telephone Encounter
Received notification that  [employer]. Jude Merlin has not been connected since 08/13. Patient was instructed to look at his/her transmitter to make sure that it is plugged into power and send a manual transmission to reconnect the transmitter. If he/she has any questions about how to send a transmission or if the transmitter does not appear to be working properly, they need to contact the device company directly. Patient was provided with that contact number. Requested the patient send [place] a MyChart message or contact our device nurses at [phone] to let us know after they have sent their transmission. MyChart sent CDJ  Note: Patient needs to send a manual remote interrogation to reestablish communication to his/her remote transmitter.

## 2021-10-11 ENCOUNTER — Encounter: Admit: 2021-10-11 | Discharge: 2021-10-11 | Payer: MEDICARE

## 2021-10-11 DIAGNOSIS — B999 Unspecified infectious disease: Secondary | ICD-10-CM

## 2021-10-11 DIAGNOSIS — M86351 Chronic multifocal osteomyelitis, right femur: Secondary | ICD-10-CM

## 2021-10-11 DIAGNOSIS — Z95810 Presence of automatic (implantable) cardiac defibrillator: Secondary | ICD-10-CM

## 2021-10-11 LAB — COMPREHENSIVE METABOLIC PANEL
ALK PHOSPHATASE: 86
ALT: 18
AST: 26
BLD UREA NITROGEN: 21
CREATININE: 1.3

## 2021-10-11 LAB — CBC AND DIFF: WBC COUNT: 6.8 K/UL (ref 0–0.45)

## 2021-10-11 LAB — CREATINE KINASE-CPK: CK TOTAL: 25 (ref <20.7)

## 2021-10-11 NOTE — Telephone Encounter
Infectious Diseases reconciliation note from [place] discharge on 10/10/21                  Per Dr. Alexandria Lodge,    1. Daptomycin 700mg  IV q 24hrs and Ceftriaxone 2g IV Q 24 hrs continue til f/u CT and appt on 9/21.     2. Weekly labs to be drawn every Monday, with start date 10/17/21: CBC w/Diff, CMP, and CPK.     3. Weekly PICC care per protocol.     4. Follow up: 11/10/21 at 4pm w/ Dr 11/12/21 Infectious Disease located at [address] [place], [place] [address]     5. CT scan scheduled for 11/10/21 at 3pm at our [facility] located at [address] [place], [place] (down the street from the hospital)     Confirmed orders with [id number] at [facility].     Original order from physician requested MRI, but  does not have available appt until November. CT ok per Dr December.

## 2021-10-11 NOTE — Telephone Encounter
-----   Message from Sherian Maroon, RN sent at 10/11/2021  3:12 PM CDT -----  Regarding: RE: MRI  12/21/21 will work.  Thx  ----- Message -----  From: Jake Shark  Sent: 10/11/2021   2:14 PM CDT  To: Sherian Maroon, RN; C[facility] H[facility] Device Mri  Subject: RE: MRI                                          11/1 1230 bell  ----- Message -----  From: Sherian Maroon, RN  Sent: 10/11/2021  11:45 AM CDT  To: Trula Ore Raley-Gordon; C[facility] H[facility] Device Mri  Subject: RE: MRI                                          1.5TCONDITIONAL. Just had one on 8/3 so we can reuse orders and meet in MRI.  Date?   ----- Message -----  From: Jake Shark  Sent: 10/11/2021  11:37 AM CDT  To: Cvm Hrm Device Mri  Subject: MRI                                              Patient name: Ruben Wood  DOB: [birth year]  MRN: [id number]  MRI Exam: MRI LOWER EXT JNT WO/W CONT RIGHT  Ordering Physician: Renetta Chalk A    Pacemaker Dependant: no  On Anti-coag: no  MRI Conditional: yes              GENERATOR ATRIAL LEAD RV LEAD LV LEAD  Manufacturer: St. Jude  St. Jude  St. Jude  [place]. Jude   Model NumberCarolyn Stare HF [id number] Tendril MRI LPA1200M/52cm Durata 7122Q/58cm Quartet 1458QL/86cm  Serial Number: [id number]  Implant Date: [id number] 11/25/2015 11/25/2015 11/25/2015  Device Type: CRT-D        [facility]

## 2021-10-12 ENCOUNTER — Encounter: Admit: 2021-10-12 | Discharge: 2021-10-12 | Payer: MEDICARE

## 2021-10-13 ENCOUNTER — Encounter: Admit: 2021-10-13 | Discharge: 2021-10-13 | Payer: MEDICARE

## 2021-10-13 MED ORDER — AMIODARONE 200 MG PO TAB
ORAL_TABLET | 0 refills | Status: CN
Start: 2021-10-13 — End: ?

## 2021-10-13 MED ORDER — MEXILETINE 150 MG PO CAP
ORAL_CAPSULE | 0 refills | Status: AC
Start: 2021-10-13 — End: ?

## 2021-10-16 ENCOUNTER — Encounter: Admit: 2021-10-16 | Discharge: 2021-10-16 | Payer: MEDICARE

## 2021-10-17 ENCOUNTER — Encounter: Admit: 2021-10-17 | Discharge: 2021-10-17 | Payer: MEDICARE

## 2021-10-19 ENCOUNTER — Encounter: Admit: 2021-10-19 | Discharge: 2021-10-19 | Payer: MEDICARE

## 2021-10-19 NOTE — Telephone Encounter
Dr. Jason Nest called requesting to speak with Dr. Crecencio Mc to provide patient update.  Requesting return call on his cell phone at [phone]    Routing to Dr. Crecencio Mc  Etta Grandchild, RN

## 2021-10-19 NOTE — Telephone Encounter
I talked to Dr. Jason Nest on the phone.  Ruben Wood is at the [facility].  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  Unfortunately, he is not doing very well.  He is having increased oxygen requirements from his baseline.  He has developed new pleural of fluid effusion.  His creatinine level has increased to 1.7 and his sodium is decreased to 126.  Dr. Jason Nest has not noticed any lower extremity swelling or significant abdominal distention or other outward signs that would suggest CHF, but I am still wondering if he is not having some CHF decompensation given this picture.  I leading suspicion is that the pleural effusion would most likely be transudative CHF related effusion.  For now, I do not think it is best to do a thoracentesis because that would require [place] to hold his clopidogrel, and he just had a recent stent placed in April.  I think our best course of action is to try to get a better sense of his volume status.  He does have a cardio mems in place, so I have reached out to Dr. Sherryll Burger to see if his team can get recent readings.  If he does have elevated filling pressures, then a trial of IV diuretics would be preferred.    I am also worried that he is getting a lot of volume potentially with his IV diuretics.  I have reached out to Dr. Alexandria Lodge to get a sense of how much volume would come with his antibiotics.

## 2021-10-20 ENCOUNTER — Encounter: Admit: 2021-10-20 | Discharge: 2021-10-20 | Payer: MEDICARE

## 2021-10-20 NOTE — Telephone Encounter
Received message from Dr Alexandria Lodge that pt is transitioning to comfort care. OPAT complete, will d/c lab orders.

## 2021-10-21 ENCOUNTER — Encounter: Admit: 2021-10-21 | Discharge: 2021-10-21 | Payer: MEDICARE

## 2021-10-21 NOTE — Progress Notes
POST-MORTEM DOCUMENTATION    Name: Mackson Botz "Jamaurie Bernier   MRN: [id number]     DOB: June 09, 1940      Age: 81 y.o.  Admission Date: (Not on file)     LOS: 0 days     Date of Service: 11/06/2021      Date of death if known:  11/05/21  Location of death, if known:Other  Swing bed [facility]   How were you notified?  Phone  Who notified us of death? Dr. Jason Nest    Was hospice involved? No  Name of hospice involved, if known: N/A  Date of hospice admission, if known: N/A    Other information:

## 2021-10-21 DEATH — deceased

## 2021-10-25 ENCOUNTER — Encounter: Admit: 2021-10-25 | Discharge: 2021-10-25 | Payer: MEDICARE

## 2021-10-26 ENCOUNTER — Encounter: Admit: 2021-10-26 | Discharge: 2021-10-26 | Payer: MEDICARE

## 2021-10-26 NOTE — Progress Notes
Received notification that  [facility] has not been connected since 10/22/2021 . Upon chart review, patient passed on Oct 22, 2021. Updated merlin and murj     Transmitter information was [email or web address] app       CDJ

## 2021-11-29 ENCOUNTER — Encounter: Admit: 2021-11-29 | Discharge: 2021-11-29 | Payer: MEDICARE

## 2021-12-21 ENCOUNTER — Encounter: Admit: 2021-12-21 | Discharge: 2021-12-21 | Payer: MEDICARE

## 2022-01-16 ENCOUNTER — Encounter: Admit: 2022-01-16 | Discharge: 2022-01-16 | Payer: MEDICARE

## 2022-01-24 ENCOUNTER — Encounter: Admit: 2022-01-24 | Discharge: 2022-01-24 | Payer: MEDICARE

## 2022-03-01 ENCOUNTER — Encounter: Admit: 2022-03-01 | Discharge: 2022-03-01 | Payer: MEDICARE

## 2022-03-13 ENCOUNTER — Encounter: Admit: 2022-03-13 | Discharge: 2022-03-13 | Payer: MEDICARE

## 2022-07-10 ENCOUNTER — Encounter: Admit: 2022-07-10 | Discharge: 2022-07-10 | Payer: MEDICARE

## 2022-09-20 IMAGING — CT CT angio chest PE protocol
3 of 7 series · 19 of 36 positions shown · IV contrast (CONT)
Comparison: Prior CT most recent 08/18/2021.

AGCHESTPE
REASON FOR EXAM: Chest pain, elevated d-dimer.
TECHNIQUE: Contrast enhanced helical pulmonary angiogram was obtained per departmental PE protocol;
coronal reformatted images were also reviewed. 3-D reconstructions were performed on an independent
workstation and reviewed.
Post-processing, retro reconstruction,  and interpretation of angiographic images of the vessels was
performed.
CONTRAST AGENT: Omnipaque 300
CONTRAST VOLUME (mL): 100

[Series 5: pe chest · axial · 0.97mm/px · z∈[-169,+81]mm · 11 of 121 slices shown]
[im 11/121  lung]
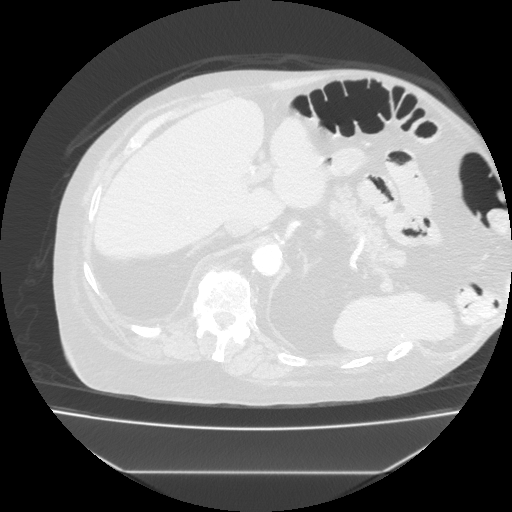
[im 21/121  mediastinal]
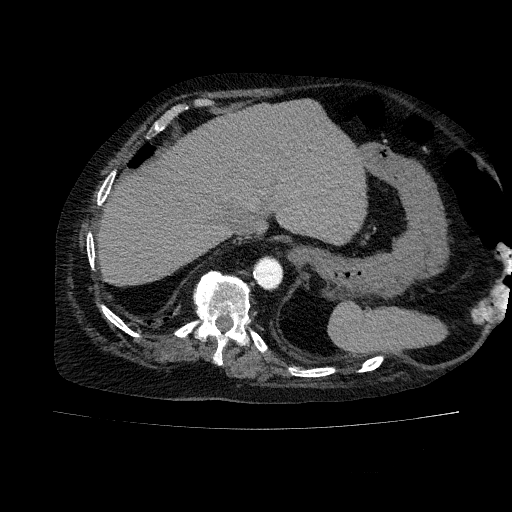
[im 31/121  lung]
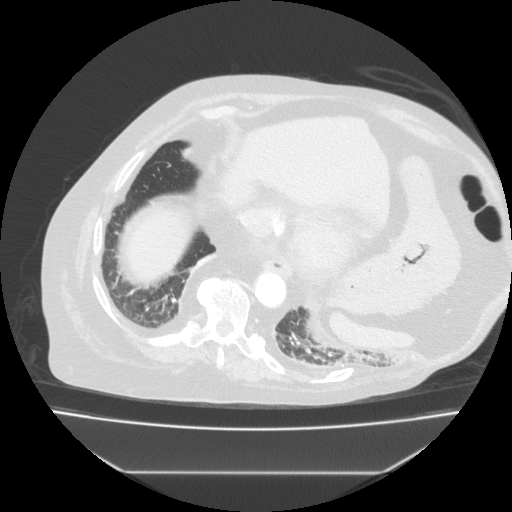
[im 41/121  mediastinal]
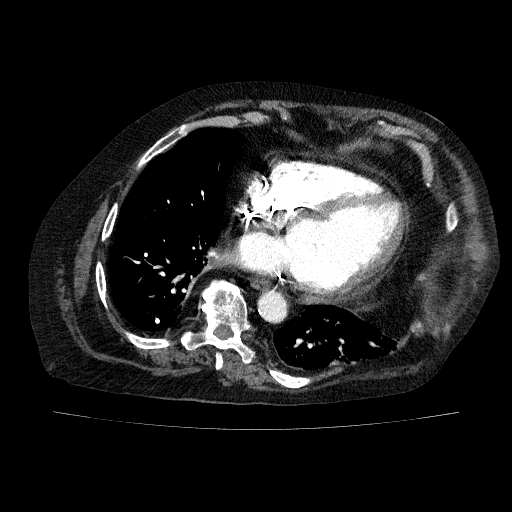
[im 51/121  lung]
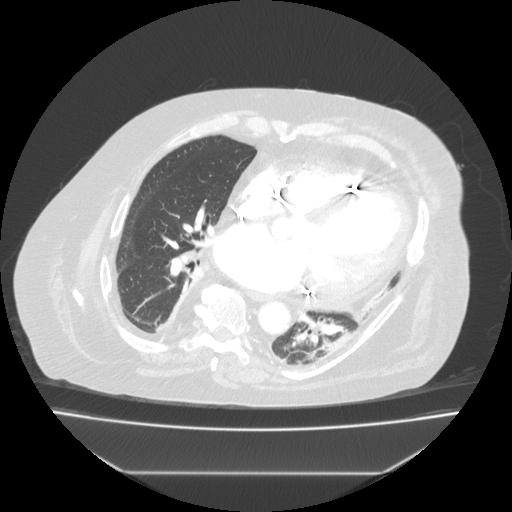
[im 61/121  mediastinal]
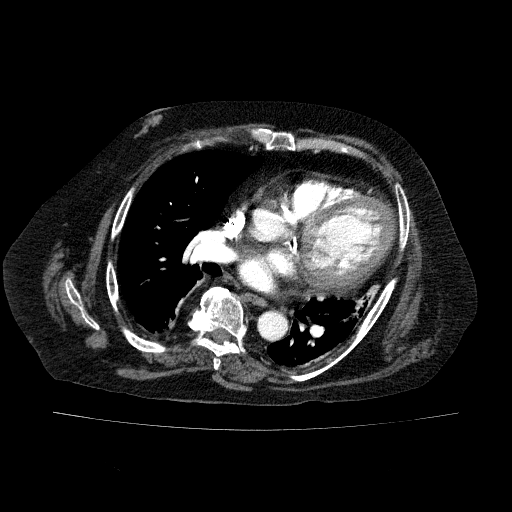
[im 71/121  lung]
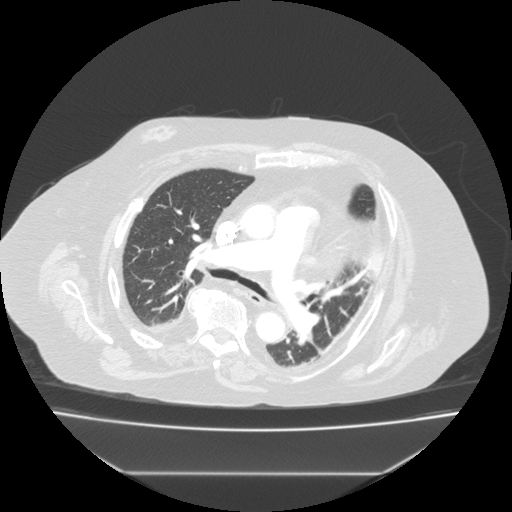
[im 81/121  mediastinal]
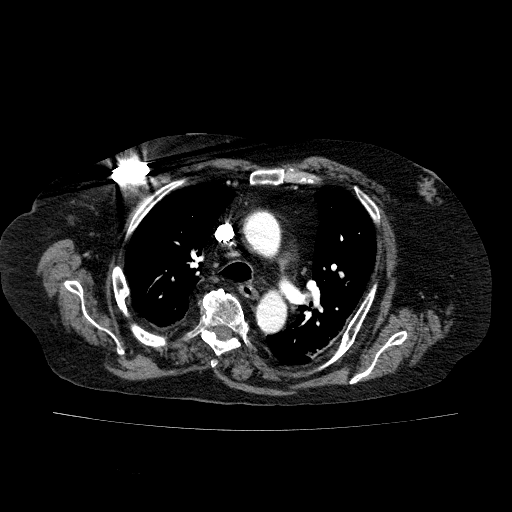
[im 91/121  lung]
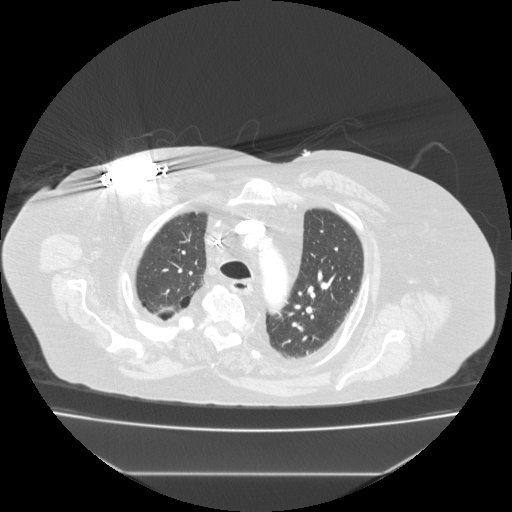
[im 101/121  mediastinal]
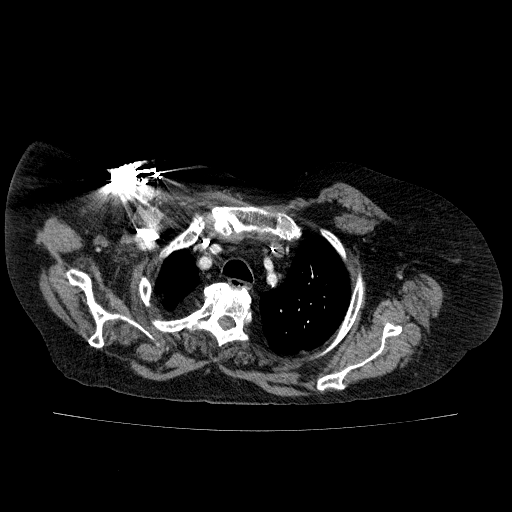
[im 111/121  lung]
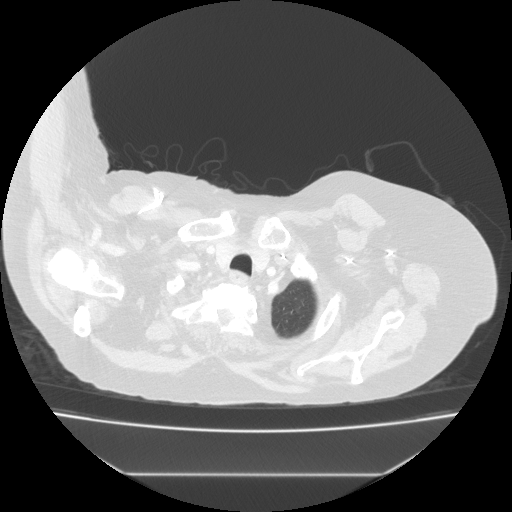

[Series 7: lung · axial · 0.97mm/px · z∈[-172,+36]mm · 7 of 121 slices shown]
[im 10/121  mediastinal]
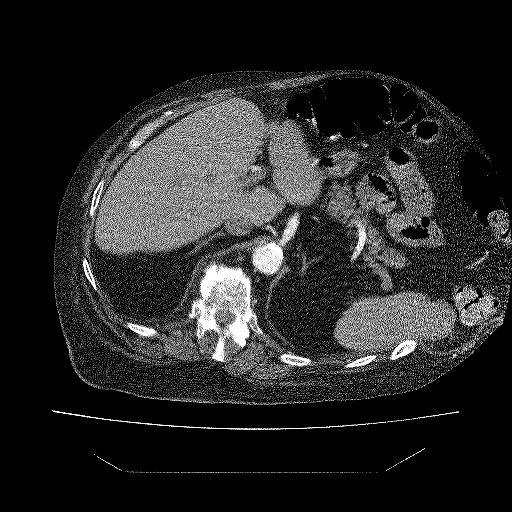
[im 28/121  mediastinal]
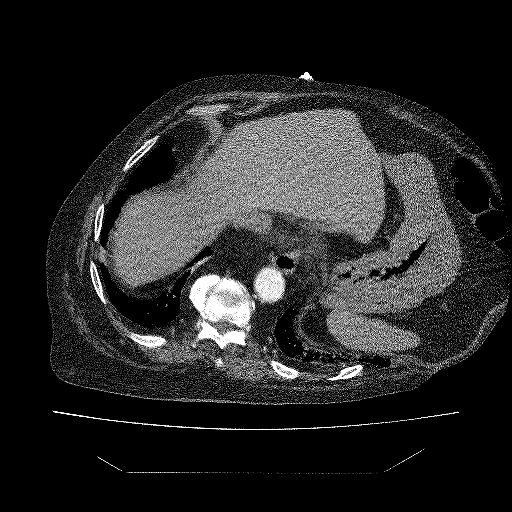
[im 37/121  mediastinal]
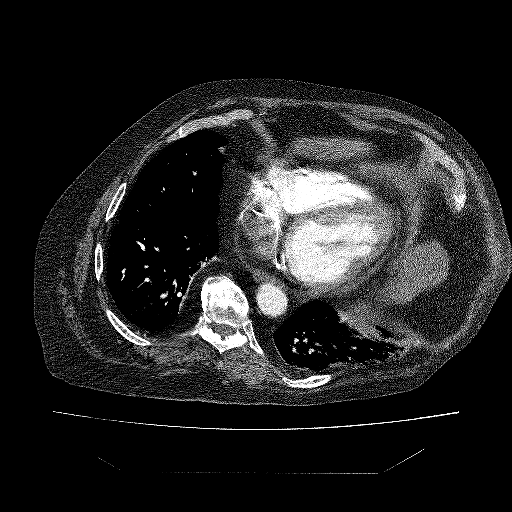
[im 56/121  mediastinal]
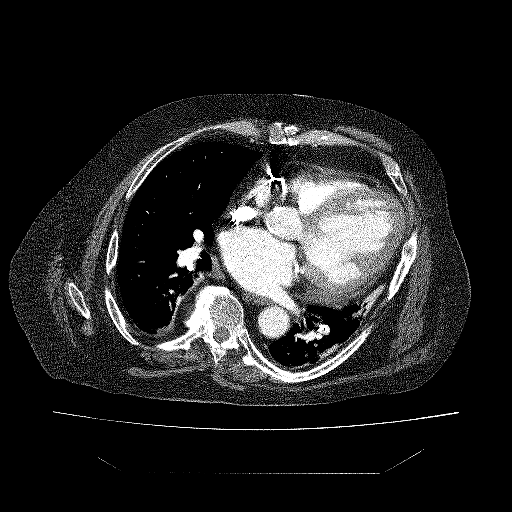
[im 65/121  mediastinal]
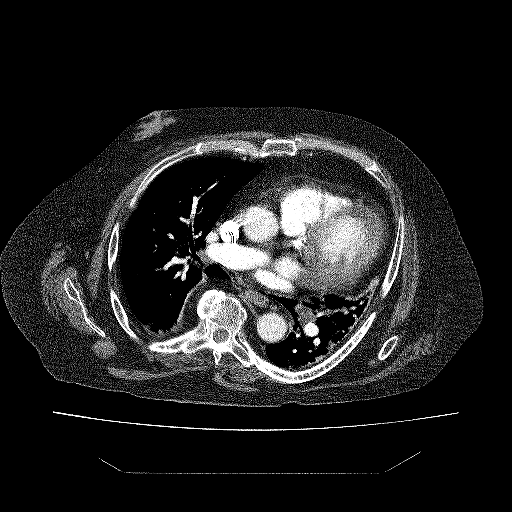
[im 84/121  mediastinal]
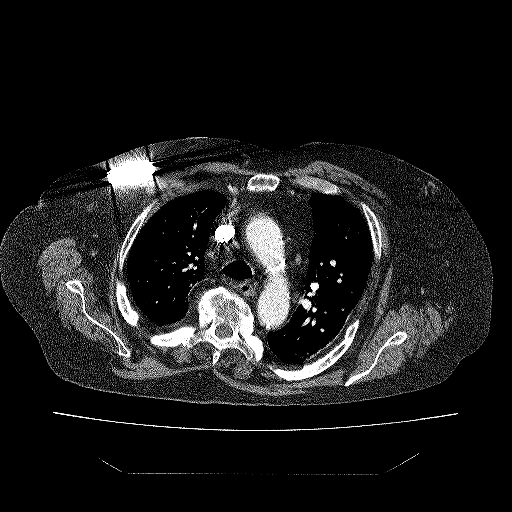
[im 93/121  mediastinal]
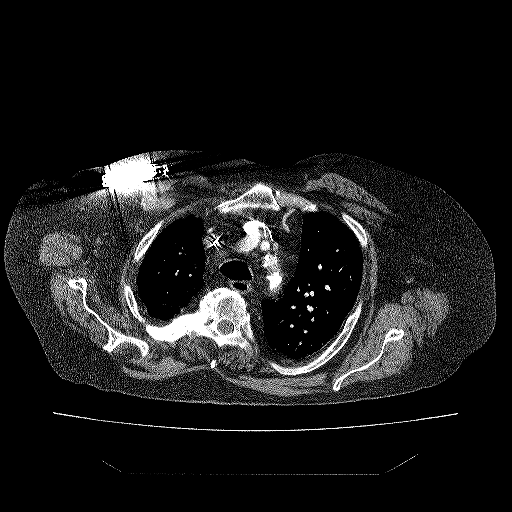

[Series 1001: coronal st w/ · coronal · 0.97mm/px · 1 of 173 slices shown]
[im 87/173  mediastinal]
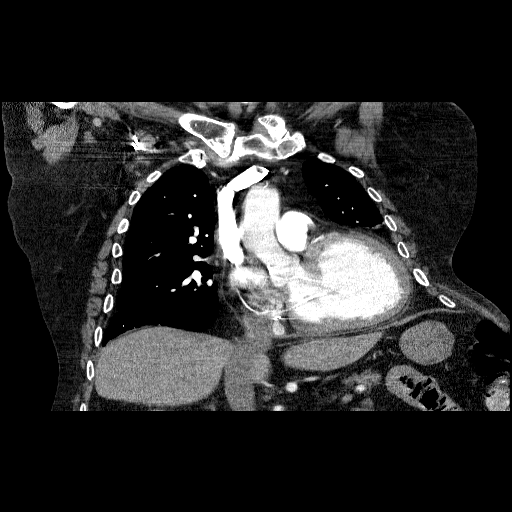

[19 of 36 positions shown; findings below may reference images not displayed]

FINDINGS: Pulmonary arteries: Optimal pulmonary artery opacification.. There is no acute pulmonary embolism.
In a subsegmental branch of left lower lobe, there is a peripheral hyperdensity filling defect on
image 67 series 5. This has been stable since 4118.
Lungs: The lung volumes are low. There are multiple simple cysts in the lung apices. There is
atelectasis in the left upper lobe. There are dependent changes of the lung bases bilaterally. No
suspicious pulmonary nodules or consolidation. A calcified granuloma can be seen at the right lung
base.
Heart and Mediastinum: The heart is enlarged with left atrial and ventricular enlargement. Pacer
leads can be seen. No pericardial effusion.  No axillary, supraclavicular, mediastinal, or hilar
adenopathy by CT criteria.
Pleura: No effusion or pneumothorax. No pleural nodularity or mass.
Abdomen: Included views of the upper abdomen demonstrate no acute abnormality. Punctate
calcifications can be seen in the spleen.
Bones and soft tissues: Regional skeletal and soft tissue structures are unremarkable. There are
healed prior right rib fracture.
IMPRESSION: No acute pulmonary thromboembolic disease.
Calcified filling defect in a subsegmental branch of left lower lobe has remained stable since 4118,
compatible with a chronic nonocclusive pulmonary embolus.
Cardiomegaly.

## 2022-09-28 ENCOUNTER — Encounter: Admit: 2022-09-28 | Discharge: 2022-09-28 | Payer: MEDICARE
